# Patient Record
Sex: Female | Born: 1950 | Race: White | Hispanic: No | Smoking: Former smoker
Health system: Southern US, Community
[De-identification: ages and names within clinical notes are randomized; demographics above are authoritative.]

## PROBLEM LIST (undated history)

## (undated) DIAGNOSIS — D473 Essential (hemorrhagic) thrombocythemia: Secondary | ICD-10-CM

## (undated) DIAGNOSIS — M159 Polyosteoarthritis, unspecified: Secondary | ICD-10-CM

## (undated) DIAGNOSIS — D7581 Myelofibrosis: Secondary | ICD-10-CM

## (undated) DIAGNOSIS — B86 Scabies: Secondary | ICD-10-CM

## (undated) DIAGNOSIS — S41001S Unspecified open wound of right shoulder, sequela: Secondary | ICD-10-CM

## (undated) DIAGNOSIS — E079 Disorder of thyroid, unspecified: Secondary | ICD-10-CM

## (undated) DIAGNOSIS — G44219 Episodic tension-type headache, not intractable: Secondary | ICD-10-CM

## (undated) DIAGNOSIS — I10 Essential (primary) hypertension: Secondary | ICD-10-CM

## (undated) DIAGNOSIS — K219 Gastro-esophageal reflux disease without esophagitis: Secondary | ICD-10-CM

## (undated) DIAGNOSIS — N289 Disorder of kidney and ureter, unspecified: Secondary | ICD-10-CM

## (undated) DIAGNOSIS — M1990 Unspecified osteoarthritis, unspecified site: Secondary | ICD-10-CM

## (undated) DIAGNOSIS — R569 Unspecified convulsions: Secondary | ICD-10-CM

## (undated) DIAGNOSIS — R739 Hyperglycemia, unspecified: Secondary | ICD-10-CM

## (undated) DIAGNOSIS — K529 Noninfective gastroenteritis and colitis, unspecified: Secondary | ICD-10-CM

## (undated) DIAGNOSIS — A048 Other specified bacterial intestinal infections: Secondary | ICD-10-CM

## (undated) DIAGNOSIS — D500 Iron deficiency anemia secondary to blood loss (chronic): Secondary | ICD-10-CM

## (undated) DIAGNOSIS — D509 Iron deficiency anemia, unspecified: Secondary | ICD-10-CM

## (undated) DIAGNOSIS — M6282 Rhabdomyolysis: Secondary | ICD-10-CM

## (undated) DIAGNOSIS — K5792 Diverticulitis of intestine, part unspecified, without perforation or abscess without bleeding: Secondary | ICD-10-CM

## (undated) DIAGNOSIS — H269 Unspecified cataract: Secondary | ICD-10-CM

## (undated) DIAGNOSIS — G2581 Restless legs syndrome: Secondary | ICD-10-CM

## (undated) DIAGNOSIS — E785 Hyperlipidemia, unspecified: Secondary | ICD-10-CM

## (undated) DIAGNOSIS — F418 Other specified anxiety disorders: Secondary | ICD-10-CM

## (undated) HISTORY — DX: Noninfective gastroenteritis and colitis, unspecified: K52.9

## (undated) HISTORY — DX: Polyosteoarthritis, unspecified: M15.9

## (undated) HISTORY — DX: Myelofibrosis: D75.81

## (undated) HISTORY — PX: BREAST BIOPSY: SHX20

## (undated) HISTORY — DX: Scabies: B86

## (undated) HISTORY — PX: BREAST CYST ASPIRATION: SHX578

## (undated) HISTORY — DX: Essential (primary) hypertension: I10

## (undated) HISTORY — DX: Essential (hemorrhagic) thrombocythemia: D47.3

## (undated) HISTORY — DX: Unspecified cataract: H26.9

## (undated) HISTORY — DX: Iron deficiency anemia, unspecified: D50.9

## (undated) HISTORY — DX: Unspecified osteoarthritis, unspecified site: M19.90

## (undated) HISTORY — DX: Diverticulitis of intestine, part unspecified, without perforation or abscess without bleeding: K57.92

## (undated) HISTORY — DX: Hyperglycemia, unspecified: R73.9

## (undated) HISTORY — DX: Restless legs syndrome: G25.81

## (undated) HISTORY — DX: Disorder of thyroid, unspecified: E07.9

## (undated) HISTORY — DX: Other specified bacterial intestinal infections: A04.8

## (undated) HISTORY — PX: ABDOMINAL HYSTERECTOMY: SHX81

## (undated) HISTORY — DX: Unspecified open wound of right shoulder, sequela: S41.001S

## (undated) HISTORY — DX: Disorder of kidney and ureter, unspecified: N28.9

## (undated) HISTORY — DX: Iron deficiency anemia secondary to blood loss (chronic): D50.0

## (undated) HISTORY — DX: Episodic tension-type headache, not intractable: G44.219

## (undated) HISTORY — DX: Other specified anxiety disorders: F41.8

## (undated) HISTORY — DX: Unspecified convulsions: R56.9

## (undated) HISTORY — DX: Hyperlipidemia, unspecified: E78.5

## (undated) HISTORY — DX: Gastro-esophageal reflux disease without esophagitis: K21.9

---

## 1990-07-31 HISTORY — PX: BREAST SURGERY: SHX581

## 2006-11-13 ENCOUNTER — Ambulatory Visit: Payer: Self-pay | Admitting: Hematology & Oncology

## 2006-12-07 LAB — CBC WITH DIFFERENTIAL/PLATELET
BASO%: 1.1 % (ref 0.0–2.0)
Basophils Absolute: 0.1 10*3/uL (ref 0.0–0.1)
EOS%: 4.2 % (ref 0.0–7.0)
Eosinophils Absolute: 0.2 10*3/uL (ref 0.0–0.5)
HCT: 39.6 % (ref 34.8–46.6)
HGB: 13.7 g/dL (ref 11.6–15.9)
LYMPH%: 31.2 % (ref 14.0–48.0)
MCH: 32.3 pg (ref 26.0–34.0)
MCHC: 34.6 g/dL (ref 32.0–36.0)
MCV: 93.5 fL (ref 81.0–101.0)
MONO#: 0.4 10*3/uL (ref 0.1–0.9)
MONO%: 7.6 % (ref 0.0–13.0)
NEUT#: 2.8 10*3/uL (ref 1.5–6.5)
NEUT%: 55.9 % (ref 39.6–76.8)
Platelets: 671 10*3/uL — ABNORMAL HIGH (ref 145–400)
RBC: 4.24 10*6/uL (ref 3.70–5.32)
RDW: 12.4 % (ref 11.3–14.5)
WBC: 5 10*3/uL (ref 3.9–10.0)
lymph#: 1.6 10*3/uL (ref 0.9–3.3)

## 2006-12-07 LAB — CHCC SMEAR

## 2006-12-11 LAB — JAK2 GENOTYPR

## 2006-12-11 LAB — FERRITIN: Ferritin: 69 ng/mL (ref 10–291)

## 2007-01-02 ENCOUNTER — Ambulatory Visit: Payer: Self-pay | Admitting: Hematology & Oncology

## 2007-01-04 LAB — CHCC SMEAR

## 2007-01-04 LAB — CBC WITH DIFFERENTIAL/PLATELET
BASO%: 0.7 % (ref 0.0–2.0)
Basophils Absolute: 0 10*3/uL (ref 0.0–0.1)
EOS%: 3.9 % (ref 0.0–7.0)
Eosinophils Absolute: 0.2 10*3/uL (ref 0.0–0.5)
HCT: 37.7 % (ref 34.8–46.6)
HGB: 13.3 g/dL (ref 11.6–15.9)
LYMPH%: 33.8 % (ref 14.0–48.0)
MCH: 32.8 pg (ref 26.0–34.0)
MCHC: 35.1 g/dL (ref 32.0–36.0)
MCV: 93.3 fL (ref 81.0–101.0)
MONO#: 0.4 10*3/uL (ref 0.1–0.9)
MONO%: 7.3 % (ref 0.0–13.0)
NEUT#: 2.9 10*3/uL (ref 1.5–6.5)
NEUT%: 54.3 % (ref 39.6–76.8)
Platelets: 684 10*3/uL — ABNORMAL HIGH (ref 145–400)
RBC: 4.04 10*6/uL (ref 3.70–5.32)
RDW: 13 % (ref 11.3–14.5)
WBC: 5.3 10*3/uL (ref 3.9–10.0)
lymph#: 1.8 10*3/uL (ref 0.9–3.3)

## 2007-01-04 LAB — IVY BLEEDING TIME: Bleeding Time: 4 Minutes (ref 2.0–8.0)

## 2007-01-07 LAB — VON WILLEBRAND PANEL
Factor-VIII Activity: 80 % (ref 75–150)
Ristocetin-Cofactor: 59 % (ref 50–150)
Von Willebrand Ag: 147 % normal (ref 61–164)

## 2007-01-07 LAB — APTT: aPTT: 36 seconds (ref 24–37)

## 2007-03-13 ENCOUNTER — Ambulatory Visit: Payer: Self-pay | Admitting: Hematology & Oncology

## 2007-03-15 LAB — CBC WITH DIFFERENTIAL/PLATELET
BASO%: 0.5 % (ref 0.0–2.0)
Basophils Absolute: 0 10*3/uL (ref 0.0–0.1)
EOS%: 5.5 % (ref 0.0–7.0)
Eosinophils Absolute: 0.3 10*3/uL (ref 0.0–0.5)
HCT: 36.6 % (ref 34.8–46.6)
HGB: 13 g/dL (ref 11.6–15.9)
LYMPH%: 30.4 % (ref 14.0–48.0)
MCH: 33.4 pg (ref 26.0–34.0)
MCHC: 35.5 g/dL (ref 32.0–36.0)
MCV: 94.2 fL (ref 81.0–101.0)
MONO#: 0.4 10*3/uL (ref 0.1–0.9)
MONO%: 7 % (ref 0.0–13.0)
NEUT#: 3 10*3/uL (ref 1.5–6.5)
NEUT%: 56.6 % (ref 39.6–76.8)
Platelets: 646 10*3/uL — ABNORMAL HIGH (ref 145–400)
RBC: 3.88 10*6/uL (ref 3.70–5.32)
RDW: 13.3 % (ref 11.3–14.5)
WBC: 5.3 10*3/uL (ref 3.9–10.0)
lymph#: 1.6 10*3/uL (ref 0.9–3.3)

## 2007-03-15 LAB — CHCC SMEAR

## 2007-03-15 LAB — FERRITIN: Ferritin: 53 ng/mL (ref 10–291)

## 2007-06-05 ENCOUNTER — Ambulatory Visit: Payer: Self-pay | Admitting: Hematology & Oncology

## 2007-06-07 LAB — CBC WITH DIFFERENTIAL/PLATELET
BASO%: 0.6 % (ref 0.0–2.0)
Basophils Absolute: 0 10*3/uL (ref 0.0–0.1)
EOS%: 2.3 % (ref 0.0–7.0)
Eosinophils Absolute: 0.2 10*3/uL (ref 0.0–0.5)
HCT: 38.5 % (ref 34.8–46.6)
HGB: 13.4 g/dL (ref 11.6–15.9)
LYMPH%: 30.6 % (ref 14.0–48.0)
MCH: 32.7 pg (ref 26.0–34.0)
MCHC: 34.9 g/dL (ref 32.0–36.0)
MCV: 93.6 fL (ref 81.0–101.0)
MONO#: 0.4 10*3/uL (ref 0.1–0.9)
MONO%: 6.3 % (ref 0.0–13.0)
NEUT#: 4.3 10*3/uL (ref 1.5–6.5)
NEUT%: 60.2 % (ref 39.6–76.8)
Platelets: 1095 10*3/uL — ABNORMAL HIGH (ref 145–400)
RBC: 4.11 10*6/uL (ref 3.70–5.32)
RDW: 13 % (ref 11.3–14.5)
WBC: 7.1 10*3/uL (ref 3.9–10.0)
lymph#: 2.2 10*3/uL (ref 0.9–3.3)

## 2007-06-07 LAB — CHCC SMEAR

## 2007-06-12 ENCOUNTER — Ambulatory Visit (HOSPITAL_COMMUNITY): Admission: RE | Admit: 2007-06-12 | Discharge: 2007-06-12 | Payer: Self-pay | Admitting: Hematology & Oncology

## 2007-06-12 LAB — COMPREHENSIVE METABOLIC PANEL
ALT: 12 U/L (ref 0–35)
AST: 19 U/L (ref 0–37)
Albumin: 4 g/dL (ref 3.5–5.2)
Alkaline Phosphatase: 65 U/L (ref 39–117)
BUN: 18 mg/dL (ref 6–23)
CO2: 30 mEq/L (ref 19–32)
Calcium: 8.6 mg/dL (ref 8.4–10.5)
Chloride: 101 mEq/L (ref 96–112)
Creatinine, Ser: 0.71 mg/dL (ref 0.40–1.20)
Glucose, Bld: 98 mg/dL (ref 70–99)
Potassium: 3.6 mEq/L (ref 3.5–5.3)
Sodium: 134 mEq/L — ABNORMAL LOW (ref 135–145)
Total Bilirubin: 0.7 mg/dL (ref 0.3–1.2)
Total Protein: 7.2 g/dL (ref 6.0–8.3)

## 2007-06-20 LAB — CBC WITH DIFFERENTIAL/PLATELET
BASO%: 0.1 % (ref 0.0–2.0)
Basophils Absolute: 0 10*3/uL (ref 0.0–0.1)
EOS%: 2.2 % (ref 0.0–7.0)
Eosinophils Absolute: 0.1 10*3/uL (ref 0.0–0.5)
HCT: 35.1 % (ref 34.8–46.6)
HGB: 12.3 g/dL (ref 11.6–15.9)
LYMPH%: 25.2 % (ref 14.0–48.0)
MCH: 33.4 pg (ref 26.0–34.0)
MCHC: 35 g/dL (ref 32.0–36.0)
MCV: 95.5 fL (ref 81.0–101.0)
MONO#: 0.1 10*3/uL (ref 0.1–0.9)
MONO%: 2.3 % (ref 0.0–13.0)
NEUT#: 4.1 10*3/uL (ref 1.5–6.5)
NEUT%: 70.2 % (ref 39.6–76.8)
Platelets: 696 10*3/uL — ABNORMAL HIGH (ref 145–400)
RBC: 3.68 10*6/uL — ABNORMAL LOW (ref 3.70–5.32)
RDW: 12.9 % (ref 11.3–14.5)
WBC: 5.8 10*3/uL (ref 3.9–10.0)
lymph#: 1.5 10*3/uL (ref 0.9–3.3)

## 2007-06-20 LAB — CHCC SMEAR

## 2007-07-02 LAB — CBC WITH DIFFERENTIAL/PLATELET
BASO%: 1 % (ref 0.0–2.0)
Basophils Absolute: 0 10*3/uL (ref 0.0–0.1)
EOS%: 2 % (ref 0.0–7.0)
Eosinophils Absolute: 0.1 10*3/uL (ref 0.0–0.5)
HCT: 35.8 % (ref 34.8–46.6)
HGB: 12.6 g/dL (ref 11.6–15.9)
LYMPH%: 36.5 % (ref 14.0–48.0)
MCH: 34.2 pg — ABNORMAL HIGH (ref 26.0–34.0)
MCHC: 35.1 g/dL (ref 32.0–36.0)
MCV: 97.6 fL (ref 81.0–101.0)
MONO#: 0.3 10*3/uL (ref 0.1–0.9)
MONO%: 7.6 % (ref 0.0–13.0)
NEUT#: 2.4 10*3/uL (ref 1.5–6.5)
NEUT%: 52.9 % (ref 39.6–76.8)
Platelets: 548 10*3/uL — ABNORMAL HIGH (ref 145–400)
RBC: 3.67 10*6/uL — ABNORMAL LOW (ref 3.70–5.32)
RDW: 13.7 % (ref 11.3–14.5)
WBC: 4.6 10*3/uL (ref 3.9–10.0)
lymph#: 1.7 10*3/uL (ref 0.9–3.3)

## 2007-07-19 LAB — CHCC SMEAR

## 2007-07-19 LAB — CBC WITH DIFFERENTIAL/PLATELET
BASO%: 1.5 % (ref 0.0–2.0)
Basophils Absolute: 0.1 10*3/uL (ref 0.0–0.1)
EOS%: 2.5 % (ref 0.0–7.0)
Eosinophils Absolute: 0.1 10*3/uL (ref 0.0–0.5)
HCT: 36.1 % (ref 34.8–46.6)
HGB: 13 g/dL (ref 11.6–15.9)
LYMPH%: 25.2 % (ref 14.0–48.0)
MCH: 35.1 pg — ABNORMAL HIGH (ref 26.0–34.0)
MCHC: 36 g/dL (ref 32.0–36.0)
MCV: 97.6 fL (ref 81.0–101.0)
MONO#: 0.4 10*3/uL (ref 0.1–0.9)
MONO%: 7.3 % (ref 0.0–13.0)
NEUT#: 3.6 10*3/uL (ref 1.5–6.5)
NEUT%: 63.5 % (ref 39.6–76.8)
Platelets: 619 10*3/uL — ABNORMAL HIGH (ref 145–400)
RBC: 3.7 10*6/uL (ref 3.70–5.32)
RDW: 16.1 % — ABNORMAL HIGH (ref 11.3–14.5)
WBC: 5.7 10*3/uL (ref 3.9–10.0)
lymph#: 1.4 10*3/uL (ref 0.9–3.3)

## 2007-08-08 ENCOUNTER — Ambulatory Visit: Payer: Self-pay | Admitting: Hematology & Oncology

## 2007-08-12 LAB — CBC WITH DIFFERENTIAL/PLATELET
BASO%: 0.9 % (ref 0.0–2.0)
Basophils Absolute: 0 10*3/uL (ref 0.0–0.1)
EOS%: 3.9 % (ref 0.0–7.0)
Eosinophils Absolute: 0.2 10*3/uL (ref 0.0–0.5)
HCT: 37.7 % (ref 34.8–46.6)
HGB: 13.1 g/dL (ref 11.6–15.9)
LYMPH%: 31.8 % (ref 14.0–48.0)
MCH: 36.3 pg — ABNORMAL HIGH (ref 26.0–34.0)
MCHC: 34.7 g/dL (ref 32.0–36.0)
MCV: 104.7 fL — ABNORMAL HIGH (ref 81.0–101.0)
MONO#: 0.4 10*3/uL (ref 0.1–0.9)
MONO%: 8.6 % (ref 0.0–13.0)
NEUT#: 2.5 10*3/uL (ref 1.5–6.5)
NEUT%: 54.8 % (ref 39.6–76.8)
Platelets: 556 10*3/uL — ABNORMAL HIGH (ref 145–400)
RBC: 3.6 10*6/uL — ABNORMAL LOW (ref 3.70–5.32)
RDW: 21.5 % — ABNORMAL HIGH (ref 11.3–14.5)
WBC: 4.5 10*3/uL (ref 3.9–10.0)
lymph#: 1.4 10*3/uL (ref 0.9–3.3)

## 2007-08-30 LAB — CBC WITH DIFFERENTIAL/PLATELET
BASO%: 0.7 % (ref 0.0–2.0)
Basophils Absolute: 0 10*3/uL (ref 0.0–0.1)
EOS%: 3.2 % (ref 0.0–7.0)
Eosinophils Absolute: 0.1 10*3/uL (ref 0.0–0.5)
HCT: 38.7 % (ref 34.8–46.6)
HGB: 13.4 g/dL (ref 11.6–15.9)
LYMPH%: 35.8 % (ref 14.0–48.0)
MCH: 36.7 pg — ABNORMAL HIGH (ref 26.0–34.0)
MCHC: 34.5 g/dL (ref 32.0–36.0)
MCV: 106.4 fL — ABNORMAL HIGH (ref 81.0–101.0)
MONO#: 0.3 10*3/uL (ref 0.1–0.9)
MONO%: 6.9 % (ref 0.0–13.0)
NEUT#: 2.3 10*3/uL (ref 1.5–6.5)
NEUT%: 53.4 % (ref 39.6–76.8)
Platelets: 533 10*3/uL — ABNORMAL HIGH (ref 145–400)
RBC: 3.64 10*6/uL — ABNORMAL LOW (ref 3.70–5.32)
RDW: 19.2 % — ABNORMAL HIGH (ref 11.3–14.5)
WBC: 4.4 10*3/uL (ref 3.9–10.0)
lymph#: 1.6 10*3/uL (ref 0.9–3.3)

## 2007-08-30 LAB — CHCC SMEAR

## 2007-09-02 LAB — FERRITIN: Ferritin: 78 ng/mL (ref 10–291)

## 2007-09-02 LAB — TRANSFERRIN RECEPTOR, SOLUABLE: Transferrin Receptor, Soluble: 21.9 nmol/L

## 2007-10-09 ENCOUNTER — Ambulatory Visit: Payer: Self-pay | Admitting: Hematology & Oncology

## 2007-10-11 LAB — CBC WITH DIFFERENTIAL/PLATELET
BASO%: 0.8 % (ref 0.0–2.0)
Basophils Absolute: 0 10*3/uL (ref 0.0–0.1)
EOS%: 2.3 % (ref 0.0–7.0)
Eosinophils Absolute: 0.1 10*3/uL (ref 0.0–0.5)
HCT: 40.6 % (ref 34.8–46.6)
HGB: 13.9 g/dL (ref 11.6–15.9)
LYMPH%: 30.9 % (ref 14.0–48.0)
MCH: 37.7 pg — ABNORMAL HIGH (ref 26.0–34.0)
MCHC: 34.3 g/dL (ref 32.0–36.0)
MCV: 110.1 fL — ABNORMAL HIGH (ref 81.0–101.0)
MONO#: 0.5 10*3/uL (ref 0.1–0.9)
MONO%: 9 % (ref 0.0–13.0)
NEUT#: 3 10*3/uL (ref 1.5–6.5)
NEUT%: 57 % (ref 39.6–76.8)
Platelets: 502 10*3/uL — ABNORMAL HIGH (ref 145–400)
RBC: 3.69 10*6/uL — ABNORMAL LOW (ref 3.70–5.32)
RDW: 13 % (ref 11.3–14.5)
WBC: 5.3 10*3/uL (ref 3.9–10.0)
lymph#: 1.6 10*3/uL (ref 0.9–3.3)

## 2007-10-11 LAB — CHCC SMEAR

## 2007-12-18 ENCOUNTER — Ambulatory Visit: Payer: Self-pay | Admitting: Hematology & Oncology

## 2008-03-11 ENCOUNTER — Ambulatory Visit: Payer: Self-pay | Admitting: Hematology & Oncology

## 2008-03-13 LAB — CBC WITH DIFFERENTIAL (CANCER CENTER ONLY)
BASO#: 0 10*3/uL (ref 0.0–0.2)
BASO%: 0.4 % (ref 0.0–2.0)
EOS%: 3.7 % (ref 0.0–7.0)
Eosinophils Absolute: 0.2 10*3/uL (ref 0.0–0.5)
HCT: 36.6 % (ref 34.8–46.6)
HGB: 12.7 g/dL (ref 11.6–15.9)
LYMPH#: 1.2 10*3/uL (ref 0.9–3.3)
LYMPH%: 25.9 % (ref 14.0–48.0)
MCH: 36.7 pg — ABNORMAL HIGH (ref 26.0–34.0)
MCHC: 34.7 g/dL (ref 32.0–36.0)
MCV: 106 fL — ABNORMAL HIGH (ref 81–101)
MONO#: 0.3 10*3/uL (ref 0.1–0.9)
MONO%: 6.7 % (ref 0.0–13.0)
NEUT#: 3 10*3/uL (ref 1.5–6.5)
NEUT%: 63.3 % (ref 39.6–80.0)
Platelets: 487 10*3/uL — ABNORMAL HIGH (ref 145–400)
RBC: 3.46 10*6/uL — ABNORMAL LOW (ref 3.70–5.32)
RDW: 11.6 % (ref 10.5–14.6)
WBC: 4.7 10*3/uL (ref 3.9–10.0)

## 2008-03-13 LAB — FERRITIN: Ferritin: 80 ng/mL (ref 10–291)

## 2008-03-13 LAB — CHCC SATELLITE - SMEAR

## 2008-06-10 ENCOUNTER — Ambulatory Visit: Payer: Self-pay | Admitting: Hematology & Oncology

## 2008-06-11 LAB — CBC WITH DIFFERENTIAL (CANCER CENTER ONLY)
BASO#: 0 10*3/uL (ref 0.0–0.2)
BASO%: 0.6 % (ref 0.0–2.0)
EOS%: 5.9 % (ref 0.0–7.0)
Eosinophils Absolute: 0.2 10*3/uL (ref 0.0–0.5)
HCT: 37.6 % (ref 34.8–46.6)
HGB: 13.1 g/dL (ref 11.6–15.9)
LYMPH#: 1.3 10*3/uL (ref 0.9–3.3)
LYMPH%: 32.6 % (ref 14.0–48.0)
MCH: 36.6 pg — ABNORMAL HIGH (ref 26.0–34.0)
MCHC: 34.8 g/dL (ref 32.0–36.0)
MCV: 105 fL — ABNORMAL HIGH (ref 81–101)
MONO#: 0.2 10*3/uL (ref 0.1–0.9)
MONO%: 5.7 % (ref 0.0–13.0)
NEUT#: 2.2 10*3/uL (ref 1.5–6.5)
NEUT%: 55.2 % (ref 39.6–80.0)
Platelets: 540 10*3/uL — ABNORMAL HIGH (ref 145–400)
RBC: 3.58 10*6/uL — ABNORMAL LOW (ref 3.70–5.32)
RDW: 10.9 % (ref 10.5–14.6)
WBC: 4 10*3/uL (ref 3.9–10.0)

## 2008-06-11 LAB — CHCC SATELLITE - SMEAR

## 2008-09-10 ENCOUNTER — Ambulatory Visit: Payer: Self-pay | Admitting: Hematology & Oncology

## 2008-11-19 ENCOUNTER — Ambulatory Visit: Payer: Self-pay | Admitting: Hematology & Oncology

## 2008-11-20 LAB — CBC WITH DIFFERENTIAL (CANCER CENTER ONLY)
BASO#: 0 10*3/uL (ref 0.0–0.2)
BASO%: 0.4 % (ref 0.0–2.0)
EOS%: 5.2 % (ref 0.0–7.0)
Eosinophils Absolute: 0.2 10*3/uL (ref 0.0–0.5)
HCT: 37.6 % (ref 34.8–46.6)
HGB: 12.6 g/dL (ref 11.6–15.9)
LYMPH#: 1.2 10*3/uL (ref 0.9–3.3)
LYMPH%: 34.9 % (ref 14.0–48.0)
MCH: 37.1 pg — ABNORMAL HIGH (ref 26.0–34.0)
MCHC: 33.6 g/dL (ref 32.0–36.0)
MCV: 111 fL — ABNORMAL HIGH (ref 81–101)
MONO#: 0.2 10*3/uL (ref 0.1–0.9)
MONO%: 6.5 % (ref 0.0–13.0)
NEUT#: 1.8 10*3/uL (ref 1.5–6.5)
NEUT%: 53 % (ref 39.6–80.0)
Platelets: 513 10*3/uL — ABNORMAL HIGH (ref 145–400)
RBC: 3.4 10*6/uL — ABNORMAL LOW (ref 3.70–5.32)
RDW: 12.7 % (ref 10.5–14.6)
WBC: 3.5 10*3/uL — ABNORMAL LOW (ref 3.9–10.0)

## 2008-11-20 LAB — RETICULOCYTES (CHCC)
ABS Retic: 64 10*3/uL (ref 19.0–186.0)
RBC.: 3.37 MIL/uL — ABNORMAL LOW (ref 3.87–5.11)
Retic Ct Pct: 1.9 % (ref 0.4–3.1)

## 2008-11-20 LAB — CHCC SATELLITE - SMEAR

## 2008-11-20 LAB — FERRITIN: Ferritin: 92 ng/mL (ref 10–291)

## 2009-01-08 ENCOUNTER — Ambulatory Visit: Payer: Self-pay | Admitting: Hematology & Oncology

## 2009-01-11 LAB — CBC WITH DIFFERENTIAL (CANCER CENTER ONLY)
BASO#: 0 10*3/uL (ref 0.0–0.2)
BASO%: 0.4 % (ref 0.0–2.0)
EOS%: 3.8 % (ref 0.0–7.0)
Eosinophils Absolute: 0.1 10*3/uL (ref 0.0–0.5)
HCT: 37.2 % (ref 34.8–46.6)
HGB: 12.6 g/dL (ref 11.6–15.9)
LYMPH#: 1.1 10*3/uL (ref 0.9–3.3)
LYMPH%: 40.6 % (ref 14.0–48.0)
MCH: 38.8 pg — ABNORMAL HIGH (ref 26.0–34.0)
MCHC: 34 g/dL (ref 32.0–36.0)
MCV: 114 fL — ABNORMAL HIGH (ref 81–101)
MONO#: 0.2 10*3/uL (ref 0.1–0.9)
MONO%: 6.4 % (ref 0.0–13.0)
NEUT#: 1.4 10*3/uL — ABNORMAL LOW (ref 1.5–6.5)
NEUT%: 48.8 % (ref 39.6–80.0)
Platelets: 358 10*3/uL (ref 145–400)
RBC: 3.25 10*6/uL — ABNORMAL LOW (ref 3.70–5.32)
RDW: 10.6 % (ref 10.5–14.6)
WBC: 2.8 10*3/uL — ABNORMAL LOW (ref 3.9–10.0)

## 2009-01-11 LAB — CHCC SATELLITE - SMEAR

## 2009-01-11 LAB — RETICULOCYTES (CHCC)
ABS Retic: 45.6 10*3/uL (ref 19.0–186.0)
RBC.: 3.26 MIL/uL — ABNORMAL LOW (ref 3.87–5.11)
Retic Ct Pct: 1.4 % (ref 0.4–3.1)

## 2009-01-11 LAB — FERRITIN: Ferritin: 96 ng/mL (ref 10–291)

## 2009-03-16 ENCOUNTER — Ambulatory Visit: Payer: Self-pay | Admitting: Hematology & Oncology

## 2009-03-17 LAB — CBC WITH DIFFERENTIAL (CANCER CENTER ONLY)
BASO#: 0 10*3/uL (ref 0.0–0.2)
BASO%: 0.6 % (ref 0.0–2.0)
EOS%: 4.8 % (ref 0.0–7.0)
Eosinophils Absolute: 0.2 10*3/uL (ref 0.0–0.5)
HCT: 38.6 % (ref 34.8–46.6)
HGB: 13.3 g/dL (ref 11.6–15.9)
LYMPH#: 1.4 10*3/uL (ref 0.9–3.3)
LYMPH%: 36.9 % (ref 14.0–48.0)
MCH: 38.3 pg — ABNORMAL HIGH (ref 26.0–34.0)
MCHC: 34.4 g/dL (ref 32.0–36.0)
MCV: 111 fL — ABNORMAL HIGH (ref 81–101)
MONO#: 0.3 10*3/uL (ref 0.1–0.9)
MONO%: 7.2 % (ref 0.0–13.0)
NEUT#: 2 10*3/uL (ref 1.5–6.5)
NEUT%: 50.5 % (ref 39.6–80.0)
Platelets: 524 10*3/uL — ABNORMAL HIGH (ref 145–400)
RBC: 3.47 10*6/uL — ABNORMAL LOW (ref 3.70–5.32)
RDW: 9.4 % — ABNORMAL LOW (ref 10.5–14.6)
WBC: 3.9 10*3/uL (ref 3.9–10.0)

## 2009-03-17 LAB — CHCC SATELLITE - SMEAR

## 2009-03-18 LAB — FERRITIN: Ferritin: 106 ng/mL (ref 10–291)

## 2009-03-18 LAB — VITAMIN D 25 HYDROXY (VIT D DEFICIENCY, FRACTURES): Vit D, 25-Hydroxy: 17 ng/mL — ABNORMAL LOW (ref 30–89)

## 2009-05-06 ENCOUNTER — Ambulatory Visit: Payer: Self-pay | Admitting: Hematology & Oncology

## 2009-05-07 LAB — CBC WITH DIFFERENTIAL (CANCER CENTER ONLY)
BASO#: 0 10*3/uL (ref 0.0–0.2)
BASO%: 0.5 % (ref 0.0–2.0)
EOS%: 3.5 % (ref 0.0–7.0)
Eosinophils Absolute: 0.2 10*3/uL (ref 0.0–0.5)
HCT: 38.2 % (ref 34.8–46.6)
HGB: 13.1 g/dL (ref 11.6–15.9)
LYMPH#: 1.4 10*3/uL (ref 0.9–3.3)
LYMPH%: 29.1 % (ref 14.0–48.0)
MCH: 37.3 pg — ABNORMAL HIGH (ref 26.0–34.0)
MCHC: 34.3 g/dL (ref 32.0–36.0)
MCV: 109 fL — ABNORMAL HIGH (ref 81–101)
MONO#: 0.3 10*3/uL (ref 0.1–0.9)
MONO%: 5.2 % (ref 0.0–13.0)
NEUT#: 3 10*3/uL (ref 1.5–6.5)
NEUT%: 61.7 % (ref 39.6–80.0)
Platelets: 522 10*3/uL — ABNORMAL HIGH (ref 145–400)
RBC: 3.51 10*6/uL — ABNORMAL LOW (ref 3.70–5.32)
RDW: 10 % — ABNORMAL LOW (ref 10.5–14.6)
WBC: 4.9 10*3/uL (ref 3.9–10.0)

## 2009-05-07 LAB — FERRITIN: Ferritin: 105 ng/mL (ref 10–291)

## 2009-05-07 LAB — CHCC SATELLITE - SMEAR

## 2009-07-15 ENCOUNTER — Ambulatory Visit: Payer: Self-pay | Admitting: Hematology & Oncology

## 2009-07-16 LAB — CBC WITH DIFFERENTIAL (CANCER CENTER ONLY)
BASO#: 0 10*3/uL (ref 0.0–0.2)
BASO%: 0.4 % (ref 0.0–2.0)
EOS%: 3 % (ref 0.0–7.0)
Eosinophils Absolute: 0.2 10*3/uL (ref 0.0–0.5)
HCT: 38.7 % (ref 34.8–46.6)
HGB: 13.6 g/dL (ref 11.6–15.9)
LYMPH#: 1.4 10*3/uL (ref 0.9–3.3)
LYMPH%: 21.6 % (ref 14.0–48.0)
MCH: 37.6 pg — ABNORMAL HIGH (ref 26.0–34.0)
MCHC: 35.1 g/dL (ref 32.0–36.0)
MCV: 107 fL — ABNORMAL HIGH (ref 81–101)
MONO#: 0.4 10*3/uL (ref 0.1–0.9)
MONO%: 5.5 % (ref 0.0–13.0)
NEUT#: 4.6 10*3/uL (ref 1.5–6.5)
NEUT%: 69.5 % (ref 39.6–80.0)
Platelets: 540 10*3/uL — ABNORMAL HIGH (ref 145–400)
RBC: 3.61 10*6/uL — ABNORMAL LOW (ref 3.70–5.32)
RDW: 11 % (ref 10.5–14.6)
WBC: 6.6 10*3/uL (ref 3.9–10.0)

## 2009-07-16 LAB — COMPREHENSIVE METABOLIC PANEL
ALT: 9 U/L (ref 0–35)
AST: 13 U/L (ref 0–37)
Albumin: 4.4 g/dL (ref 3.5–5.2)
Alkaline Phosphatase: 52 U/L (ref 39–117)
BUN: 18 mg/dL (ref 6–23)
CO2: 24 mEq/L (ref 19–32)
Calcium: 9.7 mg/dL (ref 8.4–10.5)
Chloride: 106 mEq/L (ref 96–112)
Creatinine, Ser: 0.69 mg/dL (ref 0.40–1.20)
Glucose, Bld: 70 mg/dL (ref 70–99)
Potassium: 4.3 mEq/L (ref 3.5–5.3)
Sodium: 139 mEq/L (ref 135–145)
Total Bilirubin: 0.4 mg/dL (ref 0.3–1.2)
Total Protein: 6.9 g/dL (ref 6.0–8.3)

## 2009-07-16 LAB — CHCC SATELLITE - SMEAR

## 2009-07-16 LAB — FERRITIN: Ferritin: 126 ng/mL (ref 10–291)

## 2009-08-26 ENCOUNTER — Ambulatory Visit: Payer: Self-pay | Admitting: Hematology & Oncology

## 2009-08-27 LAB — CBC WITH DIFFERENTIAL (CANCER CENTER ONLY)
BASO#: 0 10*3/uL (ref 0.0–0.2)
BASO%: 0.6 % (ref 0.0–2.0)
EOS%: 5.2 % (ref 0.0–7.0)
Eosinophils Absolute: 0.3 10*3/uL (ref 0.0–0.5)
HCT: 39.5 % (ref 34.8–46.6)
HGB: 13.4 g/dL (ref 11.6–15.9)
LYMPH#: 1.8 10*3/uL (ref 0.9–3.3)
LYMPH%: 32.8 % (ref 14.0–48.0)
MCH: 35.5 pg — ABNORMAL HIGH (ref 26.0–34.0)
MCHC: 33.8 g/dL (ref 32.0–36.0)
MCV: 105 fL — ABNORMAL HIGH (ref 81–101)
MONO#: 0.3 10*3/uL (ref 0.1–0.9)
MONO%: 4.7 % (ref 0.0–13.0)
NEUT#: 3.1 10*3/uL (ref 1.5–6.5)
NEUT%: 56.7 % (ref 39.6–80.0)
Platelets: 730 10*3/uL — ABNORMAL HIGH (ref 145–400)
RBC: 3.76 10*6/uL (ref 3.70–5.32)
RDW: 9.5 % — ABNORMAL LOW (ref 10.5–14.6)
WBC: 5.4 10*3/uL (ref 3.9–10.0)

## 2009-08-27 LAB — SEDIMENTATION RATE: Sed Rate: 12 mm/hr (ref 0–22)

## 2009-08-27 LAB — VITAMIN D 25 HYDROXY (VIT D DEFICIENCY, FRACTURES): Vit D, 25-Hydroxy: 44 ng/mL (ref 30–89)

## 2009-08-27 LAB — CK: Total CK: 51 U/L (ref 7–177)

## 2009-08-27 LAB — CHCC SATELLITE - SMEAR

## 2009-10-15 ENCOUNTER — Ambulatory Visit: Payer: Self-pay | Admitting: Hematology & Oncology

## 2009-10-18 LAB — CBC WITH DIFFERENTIAL (CANCER CENTER ONLY)
BASO#: 0 10*3/uL (ref 0.0–0.2)
BASO%: 0.5 % (ref 0.0–2.0)
EOS%: 4.5 % (ref 0.0–7.0)
Eosinophils Absolute: 0.1 10*3/uL (ref 0.0–0.5)
HCT: 38.6 % (ref 34.8–46.6)
HGB: 12.9 g/dL (ref 11.6–15.9)
LYMPH#: 1.2 10*3/uL (ref 0.9–3.3)
LYMPH%: 40.2 % (ref 14.0–48.0)
MCH: 35.6 pg — ABNORMAL HIGH (ref 26.0–34.0)
MCHC: 33.3 g/dL (ref 32.0–36.0)
MCV: 107 fL — ABNORMAL HIGH (ref 81–101)
MONO#: 0.2 10*3/uL (ref 0.1–0.9)
MONO%: 6.3 % (ref 0.0–13.0)
NEUT#: 1.4 10*3/uL — ABNORMAL LOW (ref 1.5–6.5)
NEUT%: 48.5 % (ref 39.6–80.0)
Platelets: 429 10*3/uL — ABNORMAL HIGH (ref 145–400)
RBC: 3.62 10*6/uL — ABNORMAL LOW (ref 3.70–5.32)
RDW: 11.8 % (ref 10.5–14.6)
WBC: 2.9 10*3/uL — ABNORMAL LOW (ref 3.9–10.0)

## 2009-10-18 LAB — FERRITIN: Ferritin: 130 ng/mL (ref 10–291)

## 2009-10-18 LAB — CHCC SATELLITE - SMEAR

## 2010-01-04 ENCOUNTER — Ambulatory Visit: Payer: Self-pay | Admitting: Hematology & Oncology

## 2010-01-06 LAB — CBC WITH DIFFERENTIAL (CANCER CENTER ONLY)
BASO#: 0 10*3/uL (ref 0.0–0.2)
BASO%: 0.4 % (ref 0.0–2.0)
EOS%: 4.3 % (ref 0.0–7.0)
Eosinophils Absolute: 0.2 10*3/uL (ref 0.0–0.5)
HCT: 37.1 % (ref 34.8–46.6)
HGB: 12.8 g/dL (ref 11.6–15.9)
LYMPH#: 1.2 10*3/uL (ref 0.9–3.3)
LYMPH%: 29.2 % (ref 14.0–48.0)
MCH: 37.3 pg — ABNORMAL HIGH (ref 26.0–34.0)
MCHC: 34.5 g/dL (ref 32.0–36.0)
MCV: 108 fL — ABNORMAL HIGH (ref 81–101)
MONO#: 0.2 10*3/uL (ref 0.1–0.9)
MONO%: 5.2 % (ref 0.0–13.0)
NEUT#: 2.6 10*3/uL (ref 1.5–6.5)
NEUT%: 60.9 % (ref 39.6–80.0)
Platelets: 549 10*3/uL — ABNORMAL HIGH (ref 145–400)
RBC: 3.44 10*6/uL — ABNORMAL LOW (ref 3.70–5.32)
RDW: 9.7 % — ABNORMAL LOW (ref 10.5–14.6)
WBC: 4.2 10*3/uL (ref 3.9–10.0)

## 2010-01-06 LAB — COMPREHENSIVE METABOLIC PANEL
ALT: 12 U/L (ref 0–35)
AST: 16 U/L (ref 0–37)
Albumin: 4.1 g/dL (ref 3.5–5.2)
Alkaline Phosphatase: 49 U/L (ref 39–117)
BUN: 18 mg/dL (ref 6–23)
CO2: 23 mEq/L (ref 19–32)
Calcium: 8.7 mg/dL (ref 8.4–10.5)
Chloride: 103 mEq/L (ref 96–112)
Creatinine, Ser: 0.75 mg/dL (ref 0.40–1.20)
Glucose, Bld: 93 mg/dL (ref 70–99)
Potassium: 3.9 mEq/L (ref 3.5–5.3)
Sodium: 137 mEq/L (ref 135–145)
Total Bilirubin: 0.4 mg/dL (ref 0.3–1.2)
Total Protein: 6.6 g/dL (ref 6.0–8.3)

## 2010-01-06 LAB — CHCC SATELLITE - SMEAR

## 2010-01-06 LAB — FERRITIN: Ferritin: 131 ng/mL (ref 10–291)

## 2010-04-12 ENCOUNTER — Ambulatory Visit: Payer: Self-pay | Admitting: Hematology & Oncology

## 2010-04-14 ENCOUNTER — Ambulatory Visit (HOSPITAL_BASED_OUTPATIENT_CLINIC_OR_DEPARTMENT_OTHER): Admission: RE | Admit: 2010-04-14 | Discharge: 2010-04-14 | Payer: Self-pay | Admitting: Hematology & Oncology

## 2010-04-14 ENCOUNTER — Ambulatory Visit: Payer: Self-pay | Admitting: Diagnostic Radiology

## 2010-04-14 LAB — CBC WITH DIFFERENTIAL (CANCER CENTER ONLY)
BASO#: 0 10*3/uL (ref 0.0–0.2)
BASO%: 0.5 % (ref 0.0–2.0)
EOS%: 4.7 % (ref 0.0–7.0)
Eosinophils Absolute: 0.2 10*3/uL (ref 0.0–0.5)
HCT: 38.9 % (ref 34.8–46.6)
HGB: 12.9 g/dL (ref 11.6–15.9)
LYMPH#: 1.3 10*3/uL (ref 0.9–3.3)
LYMPH%: 33.5 % (ref 14.0–48.0)
MCH: 35.4 pg — ABNORMAL HIGH (ref 26.0–34.0)
MCHC: 33.1 g/dL (ref 32.0–36.0)
MCV: 107 fL — ABNORMAL HIGH (ref 81–101)
MONO#: 0.2 10*3/uL (ref 0.1–0.9)
MONO%: 5.8 % (ref 0.0–13.0)
NEUT#: 2.2 10*3/uL (ref 1.5–6.5)
NEUT%: 55.5 % (ref 39.6–80.0)
Platelets: 688 10*3/uL — ABNORMAL HIGH (ref 145–400)
RBC: 3.63 10*6/uL — ABNORMAL LOW (ref 3.70–5.32)
RDW: 10.8 % (ref 10.5–14.6)
WBC: 3.9 10*3/uL (ref 3.9–10.0)

## 2010-04-14 LAB — FERRITIN: Ferritin: 99 ng/mL (ref 10–291)

## 2010-04-14 LAB — CHCC SATELLITE - SMEAR

## 2010-04-27 ENCOUNTER — Ambulatory Visit: Payer: Self-pay | Admitting: Cardiology

## 2010-04-27 ENCOUNTER — Encounter: Payer: Self-pay | Admitting: Hematology & Oncology

## 2010-04-27 ENCOUNTER — Ambulatory Visit: Admission: RE | Admit: 2010-04-27 | Discharge: 2010-04-27 | Payer: Self-pay | Admitting: Hematology & Oncology

## 2010-06-07 ENCOUNTER — Ambulatory Visit: Payer: Self-pay | Admitting: Hematology & Oncology

## 2010-06-09 LAB — CBC WITH DIFFERENTIAL (CANCER CENTER ONLY)
BASO#: 0 10*3/uL (ref 0.0–0.2)
BASO%: 0.6 % (ref 0.0–2.0)
EOS%: 4.3 % (ref 0.0–7.0)
Eosinophils Absolute: 0.2 10*3/uL (ref 0.0–0.5)
HCT: 37.8 % (ref 34.8–46.6)
HGB: 12.7 g/dL (ref 11.6–15.9)
LYMPH#: 1.2 10*3/uL (ref 0.9–3.3)
LYMPH%: 35.1 % (ref 14.0–48.0)
MCH: 38.1 pg — ABNORMAL HIGH (ref 26.0–34.0)
MCHC: 33.6 g/dL (ref 32.0–36.0)
MCV: 113 fL — ABNORMAL HIGH (ref 81–101)
MONO#: 0.3 10*3/uL (ref 0.1–0.9)
MONO%: 8 % (ref 0.0–13.0)
NEUT#: 1.8 10*3/uL (ref 1.5–6.5)
NEUT%: 52 % (ref 39.6–80.0)
Platelets: 414 10*3/uL — ABNORMAL HIGH (ref 145–400)
RBC: 3.33 10*6/uL — ABNORMAL LOW (ref 3.70–5.32)
RDW: 12.9 % (ref 10.5–14.6)
WBC: 3.4 10*3/uL — ABNORMAL LOW (ref 3.9–10.0)

## 2010-06-09 LAB — IRON AND TIBC
%SAT: 39 % (ref 20–55)
Iron: 110 ug/dL (ref 42–145)
TIBC: 285 ug/dL (ref 250–470)
UIBC: 175 ug/dL

## 2010-06-09 LAB — CHCC SATELLITE - SMEAR

## 2010-06-09 LAB — FERRITIN: Ferritin: 104 ng/mL (ref 10–291)

## 2010-08-02 ENCOUNTER — Ambulatory Visit: Payer: Self-pay | Admitting: Hematology & Oncology

## 2010-08-04 ENCOUNTER — Ambulatory Visit (HOSPITAL_BASED_OUTPATIENT_CLINIC_OR_DEPARTMENT_OTHER): Payer: BC Managed Care – PPO | Admitting: Oncology

## 2010-08-04 LAB — CBC WITH DIFFERENTIAL (CANCER CENTER ONLY)
BASO#: 0 10*3/uL (ref 0.0–0.2)
BASO%: 0.5 % (ref 0.0–2.0)
EOS%: 4.9 % (ref 0.0–7.0)
Eosinophils Absolute: 0.2 10*3/uL (ref 0.0–0.5)
HCT: 34.4 % — ABNORMAL LOW (ref 34.8–46.6)
HGB: 11.6 g/dL (ref 11.6–15.9)
LYMPH#: 1.3 10*3/uL (ref 0.9–3.3)
LYMPH%: 32.7 % (ref 14.0–48.0)
MCH: 39.2 pg — ABNORMAL HIGH (ref 26.0–34.0)
MCHC: 33.8 g/dL (ref 32.0–36.0)
MCV: 116 fL — ABNORMAL HIGH (ref 81–101)
MONO#: 0.3 10*3/uL (ref 0.1–0.9)
MONO%: 7.9 % (ref 0.0–13.0)
NEUT#: 2.1 10*3/uL (ref 1.5–6.5)
NEUT%: 54 % (ref 39.6–80.0)
Platelets: 664 10*3/uL — ABNORMAL HIGH (ref 145–400)
RBC: 2.97 10*6/uL — ABNORMAL LOW (ref 3.70–5.32)
RDW: 10.8 % (ref 10.5–14.6)
WBC: 3.9 10*3/uL (ref 3.9–10.0)

## 2010-08-04 LAB — RETICULOCYTES (CHCC)
ABS Retic: 67.4 10*3/uL (ref 19.0–186.0)
RBC.: 2.93 MIL/uL — ABNORMAL LOW (ref 3.87–5.11)
Retic Ct Pct: 2.3 % (ref 0.4–3.1)

## 2010-08-04 LAB — FERRITIN: Ferritin: 124 ng/mL (ref 10–291)

## 2010-08-26 LAB — CMP (CANCER CENTER ONLY)
ALT(SGPT): 13 U/L (ref 10–47)
AST: 22 U/L (ref 11–38)
Albumin: 3.6 g/dL (ref 3.3–5.5)
Alkaline Phosphatase: 50 U/L (ref 26–84)
BUN, Bld: 16 mg/dL (ref 7–22)
CO2: 27 mEq/L (ref 18–33)
Calcium: 8.5 mg/dL (ref 8.0–10.3)
Chloride: 104 mEq/L (ref 98–108)
Creat: 0.5 mg/dl — ABNORMAL LOW (ref 0.6–1.2)
Glucose, Bld: 105 mg/dL (ref 73–118)
Potassium: 3.6 mEq/L (ref 3.3–4.7)
Sodium: 139 mEq/L (ref 128–145)
Total Bilirubin: 0.7 mg/dl (ref 0.20–1.60)
Total Protein: 6.9 g/dL (ref 6.4–8.1)

## 2010-08-26 LAB — CBC WITH DIFFERENTIAL (CANCER CENTER ONLY)
BASO#: 0 10*3/uL (ref 0.0–0.2)
BASO%: 0.8 % (ref 0.0–2.0)
EOS%: 4.5 % (ref 0.0–7.0)
Eosinophils Absolute: 0.1 10*3/uL (ref 0.0–0.5)
HCT: 37 % (ref 34.8–46.6)
HGB: 12.6 g/dL (ref 11.6–15.9)
LYMPH#: 1.1 10*3/uL (ref 0.9–3.3)
LYMPH%: 34.5 % (ref 14.0–48.0)
MCH: 40.8 pg — ABNORMAL HIGH (ref 26.0–34.0)
MCHC: 34 g/dL (ref 32.0–36.0)
MCV: 120 fL — ABNORMAL HIGH (ref 81–101)
MONO#: 0.3 10*3/uL (ref 0.1–0.9)
MONO%: 8 % (ref 0.0–13.0)
NEUT#: 1.7 10*3/uL (ref 1.5–6.5)
NEUT%: 52.2 % (ref 39.6–80.0)
Platelets: 402 10*3/uL — ABNORMAL HIGH (ref 145–400)
RBC: 3.09 10*6/uL — ABNORMAL LOW (ref 3.70–5.32)
RDW: 10.4 % — ABNORMAL LOW (ref 10.5–14.6)
WBC: 3.2 10*3/uL — ABNORMAL LOW (ref 3.9–10.0)

## 2010-08-26 LAB — FERRITIN: Ferritin: 115 ng/mL (ref 10–291)

## 2010-08-26 LAB — CHCC SATELLITE - SMEAR

## 2010-08-26 LAB — LACTATE DEHYDROGENASE: LDH: 167 U/L (ref 94–250)

## 2010-10-03 ENCOUNTER — Encounter (HOSPITAL_BASED_OUTPATIENT_CLINIC_OR_DEPARTMENT_OTHER): Payer: BC Managed Care – PPO | Admitting: Hematology & Oncology

## 2010-10-03 DIAGNOSIS — D473 Essential (hemorrhagic) thrombocythemia: Secondary | ICD-10-CM

## 2010-10-03 DIAGNOSIS — R197 Diarrhea, unspecified: Secondary | ICD-10-CM

## 2010-10-03 LAB — CBC WITH DIFFERENTIAL (CANCER CENTER ONLY)
BASO#: 0.1 10*3/uL (ref 0.0–0.2)
BASO%: 1.5 % (ref 0.0–2.0)
EOS%: 3.6 % (ref 0.0–7.0)
Eosinophils Absolute: 0.1 10*3/uL (ref 0.0–0.5)
HCT: 37.1 % (ref 34.8–46.6)
HGB: 12.7 g/dL (ref 11.6–15.9)
LYMPH#: 1.3 10*3/uL (ref 0.9–3.3)
LYMPH%: 38.9 % (ref 14.0–48.0)
MCH: 40.2 pg — ABNORMAL HIGH (ref 26.0–34.0)
MCHC: 34.2 g/dL (ref 32.0–36.0)
MCV: 117 fL — ABNORMAL HIGH (ref 81–101)
MONO#: 0.3 10*3/uL (ref 0.1–0.9)
MONO%: 9 % (ref 0.0–13.0)
NEUT#: 1.6 10*3/uL (ref 1.5–6.5)
NEUT%: 47 % (ref 39.6–80.0)
Platelets: 432 10*3/uL — ABNORMAL HIGH (ref 145–400)
RBC: 3.16 10*6/uL — ABNORMAL LOW (ref 3.70–5.32)
RDW: 12.3 % (ref 11.1–15.7)
WBC: 3.3 10*3/uL — ABNORMAL LOW (ref 3.9–10.0)

## 2010-10-03 LAB — FERRITIN: Ferritin: 134 ng/mL (ref 10–291)

## 2010-10-03 LAB — CHCC SATELLITE - SMEAR

## 2010-10-30 LAB — HM PAP SMEAR: HM Pap smear: NORMAL

## 2010-11-29 LAB — HM COLONOSCOPY

## 2010-11-29 LAB — HM MAMMOGRAPHY: HM Mammogram: NORMAL

## 2010-12-07 ENCOUNTER — Other Ambulatory Visit: Payer: Self-pay | Admitting: Hematology & Oncology

## 2010-12-07 ENCOUNTER — Encounter (HOSPITAL_BASED_OUTPATIENT_CLINIC_OR_DEPARTMENT_OTHER): Payer: BC Managed Care – PPO | Admitting: Hematology & Oncology

## 2010-12-07 DIAGNOSIS — R5383 Other fatigue: Secondary | ICD-10-CM

## 2010-12-07 DIAGNOSIS — D473 Essential (hemorrhagic) thrombocythemia: Secondary | ICD-10-CM

## 2010-12-07 DIAGNOSIS — R5381 Other malaise: Secondary | ICD-10-CM

## 2010-12-07 DIAGNOSIS — R197 Diarrhea, unspecified: Secondary | ICD-10-CM

## 2010-12-07 LAB — CBC WITH DIFFERENTIAL (CANCER CENTER ONLY)
BASO#: 0 10*3/uL (ref 0.0–0.2)
BASO%: 0.9 % (ref 0.0–2.0)
EOS%: 4.4 % (ref 0.0–7.0)
Eosinophils Absolute: 0.1 10*3/uL (ref 0.0–0.5)
HCT: 36.1 % (ref 34.8–46.6)
HGB: 12.5 g/dL (ref 11.6–15.9)
LYMPH#: 1.1 10*3/uL (ref 0.9–3.3)
LYMPH%: 35.3 % (ref 14.0–48.0)
MCH: 40.2 pg — ABNORMAL HIGH (ref 26.0–34.0)
MCHC: 34.6 g/dL (ref 32.0–36.0)
MCV: 116 fL — ABNORMAL HIGH (ref 81–101)
MONO#: 0.3 10*3/uL (ref 0.1–0.9)
MONO%: 8.4 % (ref 0.0–13.0)
NEUT#: 1.6 10*3/uL (ref 1.5–6.5)
NEUT%: 51 % (ref 39.6–80.0)
Platelets: 317 10*3/uL (ref 145–400)
RBC: 3.11 10*6/uL — ABNORMAL LOW (ref 3.70–5.32)
RDW: 13.3 % (ref 11.1–15.7)
WBC: 3.2 10*3/uL — ABNORMAL LOW (ref 3.9–10.0)

## 2010-12-07 LAB — RETICULOCYTES (CHCC)
ABS Retic: 41.5 10*3/uL (ref 19.0–186.0)
RBC.: 3.19 MIL/uL — ABNORMAL LOW (ref 3.87–5.11)
Retic Ct Pct: 1.3 % (ref 0.4–3.1)

## 2010-12-07 LAB — CHCC SATELLITE - SMEAR

## 2010-12-07 LAB — FERRITIN: Ferritin: 95 ng/mL (ref 10–291)

## 2011-01-20 ENCOUNTER — Encounter: Payer: Self-pay | Admitting: Family

## 2011-01-20 ENCOUNTER — Ambulatory Visit: Payer: BC Managed Care – PPO | Admitting: Family

## 2011-01-20 ENCOUNTER — Ambulatory Visit (INDEPENDENT_AMBULATORY_CARE_PROVIDER_SITE_OTHER): Payer: BC Managed Care – PPO | Admitting: Family

## 2011-01-20 ENCOUNTER — Encounter (HOSPITAL_BASED_OUTPATIENT_CLINIC_OR_DEPARTMENT_OTHER): Payer: BC Managed Care – PPO | Admitting: Hematology & Oncology

## 2011-01-20 ENCOUNTER — Other Ambulatory Visit: Payer: Self-pay | Admitting: Family

## 2011-01-20 DIAGNOSIS — D473 Essential (hemorrhagic) thrombocythemia: Secondary | ICD-10-CM

## 2011-01-20 DIAGNOSIS — F4321 Adjustment disorder with depressed mood: Secondary | ICD-10-CM

## 2011-01-20 DIAGNOSIS — R5381 Other malaise: Secondary | ICD-10-CM

## 2011-01-20 LAB — CBC WITH DIFFERENTIAL (CANCER CENTER ONLY)
BASO#: 0 10*3/uL (ref 0.0–0.2)
BASO%: 0.8 % (ref 0.0–2.0)
EOS%: 2.5 % (ref 0.0–7.0)
Eosinophils Absolute: 0.1 10*3/uL (ref 0.0–0.5)
HCT: 40.1 % (ref 34.8–46.6)
HGB: 14.4 g/dL (ref 11.6–15.9)
LYMPH#: 2 10*3/uL (ref 0.9–3.3)
LYMPH%: 38.9 % (ref 14.0–48.0)
MCH: 39.9 pg — ABNORMAL HIGH (ref 26.0–34.0)
MCHC: 35.9 g/dL (ref 32.0–36.0)
MCV: 111 fL — ABNORMAL HIGH (ref 81–101)
MONO#: 0.5 10*3/uL (ref 0.1–0.9)
MONO%: 8.8 % (ref 0.0–13.0)
NEUT#: 2.6 10*3/uL (ref 1.5–6.5)
NEUT%: 49 % (ref 39.6–80.0)
Platelets: 610 10*3/uL — ABNORMAL HIGH (ref 145–400)
RBC: 3.61 10*6/uL — ABNORMAL LOW (ref 3.70–5.32)
RDW: 11.2 % (ref 11.1–15.7)
WBC: 5.2 10*3/uL (ref 3.9–10.0)

## 2011-01-20 MED ORDER — ALPRAZOLAM 0.25 MG PO TABS: 0.2500 mg | ORAL_TABLET | Freq: Three times a day (TID) | ORAL | Status: DC | PRN

## 2011-01-20 NOTE — Patient Instructions (Addendum)
Stop clorazepate (tranzene) Keep your upcoming appointment.  Welcome to [facility].

## 2011-01-20 NOTE — Progress Notes (Signed)
Subjective:    Patient ID: Rachael Johnson, female    DOB: 08-14-1950, 60 y.o.   MRN: [id number]  HPI  Rachael Johnson is a 60 year old female new to establish care who presents today following the death of her husband.  He suffered a heart attack while driving.  Sons are staying with her.  Her grand children are with her. She works at [employer].  She is requesting a note from today until July 9th.    Subjective:   Rachael Johnson is an 60 y.o. female who presents for evaluation and treatment of depressive symptoms.  Onset approximately 2 weeks ago, unchanged since that time.  Current symptoms include insomnia, disturbed sleep and decreased appetite.  Current treatment for depression:None Sleep problems: Marked   Early awakening:Marked   Energy: Poor Motivation: Fair Concentration: Fair Rumination/worrying: Moderate Memory: Good Tearfulness: Marked  Anxiety: Moderate  Panic: Mild  Overall Mood: Moderately worse  Hopelessness: Absent Suicidal ideation: Absent  Other/Psychosocial Stressors:  Death of husband 2 weeks ago.  Family history positive for depression in the patient's none.  Previous treatment modalities employed include None.  Past episodes of depression:no Organic causes of depression present: None.  Review of Systems Pertinent items are noted in HPI.   Objective:     Assessment:    Plan:   No diagnosis found.  Review of Systems     Past Medical History  Diagnosis Date  . Arthritis   . Allergy   . Thyroid disease   . Diverticulitis   . Frequent episodic tension-type headache   . Hypertension   . Seizure     childhood    History   Social History  . Marital Status: Married    Spouse Name: N/A    Number of Children: 2  . Years of Education: N/A   Occupational History  . APL operator     [employer]   Social History Main Topics  . Smoking status: Former Smoker -- 3.5 packs/day for 5 years    Types: Cigarettes    Quit date: 07/31/1990  .  Smokeless tobacco: Not on file  . Alcohol Use: No  . Drug Use: No  . Sexually Active: Not on file   Other Topics Concern  . Not on file   Social History Narrative   Regular exercise: noCaffeine Use: 2-3 weekly    Past Surgical History  Procedure Date  . Abdominal hysterectomy     1992  . Breast surgery 1992    biopsy, benign. Fibrocystic    Family History  Problem Relation Age of Onset  . Arthritis Mother   . Cancer Mother     ovarian  . Hypertension Mother   . Heart disease Mother   . Arthritis Father   . Cancer Father     lung  . Hypertension Father   . Cancer Sister     lung  . Cancer Brother     lung  . Cancer Brother     prostate    No Known Allergies  No current outpatient prescriptions on file prior to visit.    BP 130/80  Pulse 78  Temp(Src) 98 F (36.7 C) (Oral)  Resp 16  Wt 163 lb 1.9 oz (73.991 kg)  LMP 07/31/1990    Objective:   Physical Exam  Constitutional: She appears well-developed and well-nourished.       Tearful tired appearing white female.   Cardiovascular: Normal rate and regular rhythm.   Pulmonary/Chest: Effort normal  and breath sounds normal.  Psychiatric: Judgment and thought content normal. Cognition and memory are normal.       Tearful, but appropriate. Mildly anxious appearing.          Assessment & Plan:

## 2011-01-20 NOTE — Assessment & Plan Note (Signed)
Will plan to treat with PRN xanax for anxiety and sleep.  Hopefully, this will be a short term treatment. I did recommend that she seek professional grief counseling. She declines at this time, "I just need some time to myself and I can work through this."  25 minutes spent with patient today>50% of this time was spent counseling pt on her grief reaction.

## 2011-01-24 ENCOUNTER — Encounter: Payer: Self-pay | Admitting: Family

## 2011-01-24 ENCOUNTER — Telehealth: Payer: Self-pay | Admitting: *Deleted

## 2011-01-24 DIAGNOSIS — D75839 Thrombocytosis, unspecified: Secondary | ICD-10-CM | POA: Insufficient documentation

## 2011-01-24 DIAGNOSIS — Z0279 Encounter for issue of other medical certificate: Secondary | ICD-10-CM

## 2011-01-24 DIAGNOSIS — D473 Essential (hemorrhagic) thrombocythemia: Secondary | ICD-10-CM

## 2011-01-24 HISTORY — DX: Essential (hemorrhagic) thrombocythemia: D47.3

## 2011-01-24 NOTE — Telephone Encounter (Signed)
Pt returned phone call and would like to talk to you regarding the FMLA papers. Best# [phone]

## 2011-01-24 NOTE — Telephone Encounter (Signed)
Spoke with pt, she requests that I fax FMLA form to her HR Manager, Joanell Rising at [phone] and mail original to pt. Form faxed and mailed.

## 2011-01-24 NOTE — Telephone Encounter (Signed)
Left message on home phone to return my call. FMLA papers are at front desk for pick up.

## 2011-01-24 NOTE — Telephone Encounter (Signed)
Pt called back stating that she will pick up papers on Thursday 6.28.12

## 2011-01-26 ENCOUNTER — Telehealth: Payer: Self-pay | Admitting: *Deleted

## 2011-01-26 NOTE — Telephone Encounter (Signed)
Form received from [employer] requesting additional form completion for leave of absence through 02/06/11 and forwarded to Provider for completion and signature.

## 2011-01-31 NOTE — Telephone Encounter (Signed)
Form was signed and faxed back to [employer] on 01/30/11 and forwarded to MR for scanning.

## 2011-02-06 ENCOUNTER — Other Ambulatory Visit: Payer: Self-pay | Admitting: Hematology & Oncology

## 2011-02-06 ENCOUNTER — Ambulatory Visit (INDEPENDENT_AMBULATORY_CARE_PROVIDER_SITE_OTHER): Payer: BC Managed Care – PPO | Admitting: Family

## 2011-02-06 ENCOUNTER — Ambulatory Visit: Payer: BC Managed Care – PPO | Admitting: Internal Medicine

## 2011-02-06 ENCOUNTER — Encounter (HOSPITAL_BASED_OUTPATIENT_CLINIC_OR_DEPARTMENT_OTHER): Payer: BC Managed Care – PPO | Admitting: Hematology & Oncology

## 2011-02-06 ENCOUNTER — Encounter: Payer: Self-pay | Admitting: Family

## 2011-02-06 DIAGNOSIS — R5381 Other malaise: Secondary | ICD-10-CM

## 2011-02-06 DIAGNOSIS — R5383 Other fatigue: Secondary | ICD-10-CM

## 2011-02-06 DIAGNOSIS — E039 Hypothyroidism, unspecified: Secondary | ICD-10-CM | POA: Insufficient documentation

## 2011-02-06 DIAGNOSIS — I10 Essential (primary) hypertension: Secondary | ICD-10-CM

## 2011-02-06 DIAGNOSIS — D473 Essential (hemorrhagic) thrombocythemia: Secondary | ICD-10-CM

## 2011-02-06 DIAGNOSIS — F4321 Adjustment disorder with depressed mood: Secondary | ICD-10-CM

## 2011-02-06 LAB — CBC WITH DIFFERENTIAL (CANCER CENTER ONLY)
BASO#: 0 10*3/uL (ref 0.0–0.2)
BASO%: 1 % (ref 0.0–2.0)
EOS%: 1.5 % (ref 0.0–7.0)
Eosinophils Absolute: 0.1 10*3/uL (ref 0.0–0.5)
HCT: 37.8 % (ref 34.8–46.6)
HGB: 13.5 g/dL (ref 11.6–15.9)
LYMPH#: 1.2 10*3/uL (ref 0.9–3.3)
LYMPH%: 31.3 % (ref 14.0–48.0)
MCH: 39.4 pg — ABNORMAL HIGH (ref 26.0–34.0)
MCHC: 35.7 g/dL (ref 32.0–36.0)
MCV: 110 fL — ABNORMAL HIGH (ref 81–101)
MONO#: 0.3 10*3/uL (ref 0.1–0.9)
MONO%: 7.7 % (ref 0.0–13.0)
NEUT#: 2.3 10*3/uL (ref 1.5–6.5)
NEUT%: 58.5 % (ref 39.6–80.0)
Platelets: 464 10*3/uL — ABNORMAL HIGH (ref 145–400)
RBC: 3.43 10*6/uL — ABNORMAL LOW (ref 3.70–5.32)
RDW: 11.2 % (ref 11.1–15.7)
WBC: 3.9 10*3/uL (ref 3.9–10.0)

## 2011-02-06 LAB — BASIC METABOLIC PANEL
BUN: 20 mg/dL (ref 6–23)
CO2: 28 mEq/L (ref 19–32)
Calcium: 10.2 mg/dL (ref 8.4–10.5)
Chloride: 101 mEq/L (ref 96–112)
Creat: 0.87 mg/dL (ref 0.50–1.10)
Glucose, Bld: 77 mg/dL (ref 70–99)
Potassium: 4 mEq/L (ref 3.5–5.3)
Sodium: 139 mEq/L (ref 135–145)

## 2011-02-06 LAB — TSH: TSH: 1.941 u[IU]/mL (ref 0.350–4.500)

## 2011-02-06 LAB — CHCC SATELLITE - SMEAR

## 2011-02-06 MED ORDER — OMEPRAZOLE 40 MG PO CPDR: 40.0000 mg | DELAYED_RELEASE_CAPSULE | Freq: Every day | ORAL | Status: DC

## 2011-02-06 MED ORDER — ALPRAZOLAM 0.25 MG PO TABS: 0.2500 mg | ORAL_TABLET | Freq: Three times a day (TID) | ORAL | Status: DC | PRN

## 2011-02-06 MED ORDER — ACETAMINOPHEN-CODEINE 300-30 MG PO TABS: 1.0000 | ORAL_TABLET | ORAL | Status: DC | PRN

## 2011-02-06 MED ORDER — APAP-CODEINE & DIET MANAGE PR 300-30 MG PO MISC: 1.0000 | Freq: Three times a day (TID) | ORAL | Status: DC | PRN

## 2011-02-06 NOTE — Patient Instructions (Signed)
Please schedule a complete physical this fall. Have a nice summer!

## 2011-02-06 NOTE — Assessment & Plan Note (Signed)
Will check TSH

## 2011-02-06 NOTE — Progress Notes (Signed)
Subjective:    Patient ID: Rachael Johnson, female    DOB: [birth year], 60 y.o.   MRN: [id number]  HPI  Anxiety- she continues xanax TID. This is helping.  Still having some trouble concentrating, but this is improving.  Not sleeping well.  She slept 3 hours last night.  She would like to return to work on Monday.    GERD- reports that the aciphex is too expensive.  Wants an alternative.    Bilateral leg pain- uses naproxen for the knees.  She reports that she is on tylenol with codiene PRN for the last 1 year.    Review of Systems See HPI  Past Medical History  Diagnosis Date  . Arthritis   . Allergy   . Thyroid disease   . Diverticulitis   . Frequent episodic tension-type headache   . Hypertension   . Seizure     childhood  . Essential thrombocythemia 01/24/2011    History   Social History  . Marital Status: Widowed    Spouse Name: N/A    Number of Children: 2  . Years of Education: N/A   Occupational History  . APL operator     [employer]   Social History Main Topics  . Smoking status: Former Smoker -- 3.5 packs/day for 5 years    Types: Cigarettes    Quit date: 07/31/1990  . Smokeless tobacco: Not on file  . Alcohol Use: No  . Drug Use: No  . Sexually Active: Not on file   Other Topics Concern  . Not on file   Social History Narrative   Regular exercise: noCaffeine Use: 2-3 weekly    Past Surgical History  Procedure Date  . Abdominal hysterectomy     1992  . Breast surgery 1992    biopsy, benign. Fibrocystic    Family History  Problem Relation Age of Onset  . Arthritis Mother   . Cancer Mother     ovarian  . Hypertension Mother   . Heart disease Mother   . Arthritis Father   . Cancer Father     lung  . Hypertension Father   . Cancer Sister     lung  . Cancer Brother     lung  . Cancer Brother     prostate    No Known Allergies  Current Outpatient Prescriptions on File Prior to Visit  Medication Sig Dispense Refill  . aspirin 81  MG tablet Take 81 mg by mouth daily.        . ergocalciferol (VITAMIN D2) 50000 UNITS capsule Take 1 capsule every other week.       . estradiol (ESTRACE) 0.5 MG tablet Take 0.5 mg by mouth 2 (two) times daily.        . furosemide (LASIX) 20 MG tablet Take 20 mg by mouth every morning.        . hydrochlorothiazide (,MICROZIDE/HYDRODIURIL,) 12.5 MG capsule Take 12.5 mg by mouth daily.        . hydroxyurea (HYDREA) 500 MG capsule Take 500 mg by mouth 2 (two) times daily. May take with food to minimize GI side effects.       Marland Kitchen levothyroxine (SYNTHROID, LEVOTHROID) 88 MCG tablet Take 88 mcg by mouth daily.        . Multiple Vitamin (MULTIVITAMIN PO) Take 1 tablet by mouth daily.        . naproxen (NAPROSYN) 500 MG tablet Take 500 mg by mouth 2 (two) times daily with a meal.        .  RABEprazole (ACIPHEX) 20 MG tablet Take 20 mg by mouth daily.        Marland Kitchen DISCONTD: ALPRAZolam (XANAX) 0.25 MG tablet Take 1 tablet (0.25 mg total) by mouth 3 (three) times daily as needed for anxiety.  30 tablet  0  . DISCONTD: APAP-Codeine & Diet Manage Pr 300-30 MG MISC Take 1 tablet by mouth every 4 (four) hours as needed.          BP 100/60  Pulse 72  Temp(Src) 97.8 F (36.6 C) (Oral)  Resp 16  Wt 160 lb 1.9 oz (72.63 kg)  LMP 07/31/1990       Objective:   Physical Exam  Constitutional: She appears well-developed and well-nourished.  Psychiatric: She has a normal mood and affect. Her behavior is normal. Judgment and thought content normal.       Mild tearfulness today at times, but overall more composed today.          Assessment & Plan:  20 minutes spent with pt.  >50% of this time was spent counseling pt on her anxiety/grief reaction.

## 2011-02-06 NOTE — Assessment & Plan Note (Signed)
This is improving.  Continue the xanax prn.  May be able to wean down in a few months.

## 2011-02-07 ENCOUNTER — Encounter: Payer: Self-pay | Admitting: Family

## 2011-02-07 ENCOUNTER — Telehealth: Payer: Self-pay | Admitting: Family

## 2011-02-07 ENCOUNTER — Other Ambulatory Visit: Payer: Self-pay | Admitting: *Deleted

## 2011-02-07 LAB — RETICULOCYTES (CHCC)
ABS Retic: 62.5 10*3/uL (ref 19.0–186.0)
RBC.: 3.47 MIL/uL — ABNORMAL LOW (ref 3.87–5.11)
Retic Ct Pct: 1.8 % (ref 0.4–2.3)

## 2011-02-07 LAB — FERRITIN: Ferritin: 123 ng/mL (ref 10–291)

## 2011-02-07 LAB — VITAMIN D 25 HYDROXY (VIT D DEFICIENCY, FRACTURES): Vit D, 25-Hydroxy: 46 ng/mL (ref 30–89)

## 2011-02-07 NOTE — Telephone Encounter (Signed)
Faxed letter to [phone] clearing pt to return to work on 02/13/11.

## 2011-02-07 NOTE — Telephone Encounter (Signed)
Received message from pharmacy needing clarification of pt's Tylenol w/codeine rxs. They received 2 rx yesterday with different directions. 1 says every 4 hours as needed and the other says three times daily as needed. Please advise which directions are correct?

## 2011-02-07 NOTE — Telephone Encounter (Signed)
Should read 3x daily as needed.

## 2011-02-07 NOTE — Telephone Encounter (Signed)
Spoke to [facility]. She states they filled Tylenol w/cod rx with directions of every 4 hours on yesterday. Advised her to remove remaining refills and cancel 2nd rx they received with three times daily dosing as pt has already picked up rx. Future direction will be three times a day and correction has been made to the medication list.

## 2011-02-08 ENCOUNTER — Other Ambulatory Visit: Payer: Self-pay | Admitting: Hematology & Oncology

## 2011-02-08 DIAGNOSIS — M79604 Pain in right leg: Secondary | ICD-10-CM

## 2011-02-08 DIAGNOSIS — M79605 Pain in left leg: Secondary | ICD-10-CM

## 2011-02-21 ENCOUNTER — Encounter (HOSPITAL_COMMUNITY)
Admission: RE | Admit: 2011-02-21 | Discharge: 2011-02-21 | Disposition: A | Discharge status: Home / Self Care | Payer: BC Managed Care – PPO | Source: Ambulatory Visit | Attending: Hematology & Oncology | Admitting: Hematology & Oncology

## 2011-02-21 DIAGNOSIS — M79604 Pain in right leg: Secondary | ICD-10-CM

## 2011-02-21 DIAGNOSIS — M7989 Other specified soft tissue disorders: Secondary | ICD-10-CM | POA: Insufficient documentation

## 2011-02-21 DIAGNOSIS — M79605 Pain in left leg: Secondary | ICD-10-CM

## 2011-02-21 DIAGNOSIS — M79609 Pain in unspecified limb: Secondary | ICD-10-CM | POA: Insufficient documentation

## 2011-02-21 MED ORDER — TECHNETIUM TC 99M MEDRONATE IV KIT
24.0000 | PACK | Freq: Once | INTRAVENOUS | Status: AC | PRN
  Administered 2011-02-21: 24 via INTRAVENOUS

## 2011-03-03 ENCOUNTER — Other Ambulatory Visit: Payer: Self-pay | Admitting: *Deleted

## 2011-03-03 MED ORDER — ACETAMINOPHEN-CODEINE 300-30 MG PO TABS: 1.0000 | ORAL_TABLET | Freq: Three times a day (TID) | ORAL | Status: DC | PRN

## 2011-03-03 NOTE — Telephone Encounter (Signed)
Received fax from [facility] requesting refill of Tylenol #3. Spoke to [place] and verified that per 02/07/11 phone note, directions should be 1 tablet three times a day as needed. 7/10 rx was cancelled as pt had picked up refill with old directions of every four hours as needed. Gave refill as APAP/Codeine 1 tablet three times daily as needed #30 x no refills to [place].

## 2011-03-24 ENCOUNTER — Encounter (HOSPITAL_BASED_OUTPATIENT_CLINIC_OR_DEPARTMENT_OTHER): Payer: BC Managed Care – PPO | Admitting: Hematology & Oncology

## 2011-03-24 ENCOUNTER — Ambulatory Visit (INDEPENDENT_AMBULATORY_CARE_PROVIDER_SITE_OTHER): Payer: BC Managed Care – PPO | Admitting: Internal Medicine

## 2011-03-24 ENCOUNTER — Encounter: Payer: Self-pay | Admitting: Internal Medicine

## 2011-03-24 ENCOUNTER — Other Ambulatory Visit: Payer: Self-pay | Admitting: Hematology & Oncology

## 2011-03-24 DIAGNOSIS — G8929 Other chronic pain: Secondary | ICD-10-CM

## 2011-03-24 DIAGNOSIS — M159 Polyosteoarthritis, unspecified: Secondary | ICD-10-CM | POA: Insufficient documentation

## 2011-03-24 DIAGNOSIS — F419 Anxiety disorder, unspecified: Secondary | ICD-10-CM | POA: Insufficient documentation

## 2011-03-24 DIAGNOSIS — R5383 Other fatigue: Secondary | ICD-10-CM

## 2011-03-24 DIAGNOSIS — F329 Major depressive disorder, single episode, unspecified: Secondary | ICD-10-CM

## 2011-03-24 DIAGNOSIS — F32A Depression, unspecified: Secondary | ICD-10-CM

## 2011-03-24 DIAGNOSIS — D473 Essential (hemorrhagic) thrombocythemia: Secondary | ICD-10-CM

## 2011-03-24 DIAGNOSIS — R5381 Other malaise: Secondary | ICD-10-CM

## 2011-03-24 DIAGNOSIS — F418 Other specified anxiety disorders: Secondary | ICD-10-CM | POA: Insufficient documentation

## 2011-03-24 DIAGNOSIS — M25569 Pain in unspecified knee: Secondary | ICD-10-CM

## 2011-03-24 DIAGNOSIS — F411 Generalized anxiety disorder: Secondary | ICD-10-CM

## 2011-03-24 HISTORY — DX: Polyosteoarthritis, unspecified: M15.9

## 2011-03-24 HISTORY — DX: Other specified anxiety disorders: F41.8

## 2011-03-24 LAB — CBC WITH DIFFERENTIAL (CANCER CENTER ONLY)
BASO#: 0 10*3/uL (ref 0.0–0.2)
BASO%: 0.5 % (ref 0.0–2.0)
EOS%: 2.3 % (ref 0.0–7.0)
Eosinophils Absolute: 0.1 10*3/uL (ref 0.0–0.5)
HCT: 37.7 % (ref 34.8–46.6)
HGB: 13.3 g/dL (ref 11.6–15.9)
LYMPH#: 1.3 10*3/uL (ref 0.9–3.3)
LYMPH%: 32.6 % (ref 14.0–48.0)
MCH: 38.4 pg — ABNORMAL HIGH (ref 26.0–34.0)
MCHC: 35.3 g/dL (ref 32.0–36.0)
MCV: 109 fL — ABNORMAL HIGH (ref 81–101)
MONO#: 0.3 10*3/uL (ref 0.1–0.9)
MONO%: 8.6 % (ref 0.0–13.0)
NEUT#: 2.2 10*3/uL (ref 1.5–6.5)
NEUT%: 56 % (ref 39.6–80.0)
Platelets: 496 10*3/uL — ABNORMAL HIGH (ref 145–400)
RBC: 3.46 10*6/uL — ABNORMAL LOW (ref 3.70–5.32)
RDW: 12.7 % (ref 11.1–15.7)
WBC: 3.8 10*3/uL — ABNORMAL LOW (ref 3.9–10.0)

## 2011-03-24 LAB — CHCC SATELLITE - SMEAR

## 2011-03-24 MED ORDER — OMEPRAZOLE 40 MG PO CPDR: 40.0000 mg | DELAYED_RELEASE_CAPSULE | Freq: Every day | ORAL | Status: DC

## 2011-03-24 MED ORDER — CITALOPRAM HYDROBROMIDE 20 MG PO TABS: 20.0000 mg | ORAL_TABLET | Freq: Every day | ORAL | Status: DC

## 2011-03-24 MED ORDER — LEVOTHYROXINE SODIUM 88 MCG PO TABS: 88.0000 ug | ORAL_TABLET | Freq: Every day | ORAL | Status: DC

## 2011-03-24 MED ORDER — ESTRADIOL 0.5 MG PO TABS: 0.5000 mg | ORAL_TABLET | Freq: Two times a day (BID) | ORAL | Status: DC

## 2011-03-24 MED ORDER — ACETAMINOPHEN-CODEINE 300-30 MG PO TABS: 1.0000 | ORAL_TABLET | Freq: Three times a day (TID) | ORAL | Status: DC | PRN

## 2011-03-24 MED ORDER — FUROSEMIDE 20 MG PO TABS: 20.0000 mg | ORAL_TABLET | ORAL | Status: DC

## 2011-03-24 MED ORDER — NAPROXEN 500 MG PO TABS: 500.0000 mg | ORAL_TABLET | Freq: Two times a day (BID) | ORAL | Status: DC

## 2011-03-24 MED ORDER — HYDROCHLOROTHIAZIDE 12.5 MG PO CAPS: 12.5000 mg | ORAL_CAPSULE | Freq: Every day | ORAL | Status: DC

## 2011-03-24 NOTE — Assessment & Plan Note (Signed)
Stable. Continue nsaid with food and no other nsaids. Aware of potential for gi adverse effeccts. chem7 nl 7/12. rf tylenol with codeine prn use. Aware of potential for addiction and/or tolerance.

## 2011-03-24 NOTE — Assessment & Plan Note (Signed)
Do not recommend dose increase. Begin ssri as above. Encouraged sparing use. Aware of potential for addiction and/or tolerance.

## 2011-03-24 NOTE — Progress Notes (Signed)
  Subjective:    Patient ID: Rachael Johnson, female    DOB: 05-05-1951, 60 y.o.   MRN: [id number]  HPI Pt presents to clinic for followup of multiple medical problems. Notes continued anxiety and depression stemming from the death of her husband. Takes xanax prn and feels suboptimal results from current dose. No side effects. H/o thrombocythemia followed by H/O with recent improvement of plt count to 464. Aciphex changed to omeprazole for cost consideration and gerd sx's controlled. Suffers from chronic pain from knee oa worse at the end of day after work. No injury/trauma. Takes tylenol with codeine chronically. [place] narcotic database reviewed with pattern of single providers and no pattern of early fills or abuse. No other complaints.  Past Medical History  Diagnosis Date  . Arthritis   . Allergy   . Thyroid disease   . Diverticulitis   . Frequent episodic tension-type headache   . Hypertension   . Seizure     childhood  . Essential thrombocythemia 01/24/2011   Past Surgical History  Procedure Date  . Abdominal hysterectomy     1992  . Breast surgery 1992    biopsy, benign. Fibrocystic    reports that she quit smoking about 20 years ago. Her smoking use included Cigarettes. She has a 17.5 pack-year smoking history. She does not have any smokeless tobacco history on file. She reports that she does not drink alcohol or use illicit drugs. family history includes Arthritis in her father and mother; Cancer in her brothers, father, mother, and sister; Heart disease in her mother; and Hypertension in her father and mother. No Known Allergies  Review of Systems see hpi     Objective:   Physical Exam  Physical Exam  Nursing note and vitals reviewed. Constitutional: Appears well-developed and well-nourished. No distress.  HENT:  Head: Normocephalic and atraumatic.  Right Ear: External ear normal.  Left Ear: External ear normal.  Eyes: Conjunctivae are normal. No scleral icterus.  Neck:  Neck supple. Carotid bruit is not present.  Cardiovascular: Normal rate, regular rhythm and normal heart sounds.  Exam reveals no gallop and no friction rub.   No murmur heard. Pulmonary/Chest: Effort normal and breath sounds normal. No respiratory distress. He has no wheezes. no rales.  Lymphadenopathy:    He has no cervical adenopathy.  Neurological:Alert.  Skin: Skin is warm and dry. Not diaphoretic.  Psychiatric: Has a normal mood and affect.        Assessment & Plan:

## 2011-03-24 NOTE — Assessment & Plan Note (Signed)
Begin celexa 20mg  po qd. Declines therapist referral. F/u 4 wks or sooner if necessary.

## 2011-03-25 LAB — RETICULOCYTES (CHCC)
ABS Retic: 57.1 10*3/uL (ref 19.0–186.0)
RBC.: 3.57 MIL/uL — ABNORMAL LOW (ref 3.87–5.11)
Retic Ct Pct: 1.6 % (ref 0.4–2.3)

## 2011-03-25 LAB — VITAMIN D 25 HYDROXY (VIT D DEFICIENCY, FRACTURES): Vit D, 25-Hydroxy: 50 ng/mL (ref 30–89)

## 2011-03-25 LAB — IRON AND TIBC
%SAT: 19 % — ABNORMAL LOW (ref 20–55)
Iron: 50 ug/dL (ref 42–145)
TIBC: 262 ug/dL (ref 250–470)
UIBC: 212 ug/dL

## 2011-03-25 LAB — FERRITIN: Ferritin: 110 ng/mL (ref 10–291)

## 2011-04-07 ENCOUNTER — Encounter (HOSPITAL_BASED_OUTPATIENT_CLINIC_OR_DEPARTMENT_OTHER): Payer: BC Managed Care – PPO | Admitting: Hematology & Oncology

## 2011-04-07 DIAGNOSIS — D473 Essential (hemorrhagic) thrombocythemia: Secondary | ICD-10-CM

## 2011-04-26 ENCOUNTER — Encounter: Payer: Self-pay | Admitting: Internal Medicine

## 2011-04-26 ENCOUNTER — Ambulatory Visit (INDEPENDENT_AMBULATORY_CARE_PROVIDER_SITE_OTHER): Payer: BC Managed Care – PPO | Admitting: Internal Medicine

## 2011-04-26 DIAGNOSIS — R031 Nonspecific low blood-pressure reading: Secondary | ICD-10-CM | POA: Insufficient documentation

## 2011-04-26 DIAGNOSIS — F32A Depression, unspecified: Secondary | ICD-10-CM

## 2011-04-26 DIAGNOSIS — F329 Major depressive disorder, single episode, unspecified: Secondary | ICD-10-CM

## 2011-04-26 MED ORDER — CITALOPRAM HYDROBROMIDE 40 MG PO TABS: 40.0000 mg | ORAL_TABLET | Freq: Every day | ORAL | Status: DC

## 2011-04-26 MED ORDER — ALPRAZOLAM 0.25 MG PO TABS: 0.2500 mg | ORAL_TABLET | Freq: Three times a day (TID) | ORAL | Status: DC | PRN

## 2011-04-26 NOTE — Assessment & Plan Note (Signed)
Hold hctz x 4 days then resume and monitor bp.

## 2011-04-26 NOTE — Progress Notes (Signed)
  Subjective:    Patient ID: Rachael Johnson, female    DOB: 1951-03-08, 60 y.o.   MRN: [id number]  HPI Pt presents to clinic for followup of multiple medical problems. Tolerating celexa 20mg  daily without side effect. Feels improved mood and less depression and anxiety. Still takes xanax prn predominantly on the weekends. BP low nl without dizziness, presyncope or syncope. No other complaints.  Past Medical History  Diagnosis Date  . Arthritis   . Allergy   . Thyroid disease   . Diverticulitis   . Frequent episodic tension-type headache   . Hypertension   . Seizure     childhood  . Essential thrombocythemia 01/24/2011   Past Surgical History  Procedure Date  . Abdominal hysterectomy     1992  . Breast surgery 1992    biopsy, benign. Fibrocystic    reports that she quit smoking about 20 years ago. Her smoking use included Cigarettes. She has a 17.5 pack-year smoking history. She has never used smokeless tobacco. She reports that she does not drink alcohol or use illicit drugs. family history includes Arthritis in her father and mother; Cancer in her brothers, father, mother, and sister; Heart disease in her mother; and Hypertension in her father and mother. No Known Allergies   Review of Systems see hpi     Objective:   Physical Exam  Physical Exam  Nursing note and vitals reviewed. Constitutional: Appears well-developed and well-nourished. No distress.  HENT:  Head: Normocephalic and atraumatic.  Right Ear: External ear normal.  Left Ear: External ear normal.  Eyes: Conjunctivae are normal. No scleral icterus.  Neck: Neck supple. Carotid bruit is not present.  Cardiovascular: Normal rate, regular rhythm and normal heart sounds.  Exam reveals no gallop and no friction rub.   No murmur heard. Pulmonary/Chest: Effort normal and breath sounds normal. No respiratory distress. He has no wheezes. no rales.  Lymphadenopathy:    He has no cervical adenopathy.  Neurological:Alert.   Skin: Skin is warm and dry. Not diaphoretic.  Psychiatric: Has a normal mood and affect.        Assessment & Plan:

## 2011-04-26 NOTE — Patient Instructions (Signed)
Please schedule lipid (chol screening), tsh,free t4 (hypothyroidism), chem7/lft (v58.69) and vit d (vit d deficiency) prior to next visit

## 2011-04-26 NOTE — Assessment & Plan Note (Signed)
Improving. No si/hi. Increase celexa 40mg  po qd.

## 2011-04-28 ENCOUNTER — Other Ambulatory Visit: Payer: Self-pay | Admitting: Internal Medicine

## 2011-04-28 DIAGNOSIS — E039 Hypothyroidism, unspecified: Secondary | ICD-10-CM

## 2011-04-28 DIAGNOSIS — Z79899 Other long term (current) drug therapy: Secondary | ICD-10-CM

## 2011-04-28 DIAGNOSIS — Z13220 Encounter for screening for lipoid disorders: Secondary | ICD-10-CM

## 2011-05-19 ENCOUNTER — Other Ambulatory Visit: Payer: Self-pay | Admitting: Hematology & Oncology

## 2011-05-19 ENCOUNTER — Encounter (HOSPITAL_BASED_OUTPATIENT_CLINIC_OR_DEPARTMENT_OTHER): Payer: BC Managed Care – PPO | Admitting: Hematology & Oncology

## 2011-05-19 DIAGNOSIS — D473 Essential (hemorrhagic) thrombocythemia: Secondary | ICD-10-CM

## 2011-05-19 DIAGNOSIS — R5381 Other malaise: Secondary | ICD-10-CM

## 2011-05-19 DIAGNOSIS — Z23 Encounter for immunization: Secondary | ICD-10-CM

## 2011-05-19 LAB — CBC WITH DIFFERENTIAL (CANCER CENTER ONLY)
BASO#: 0 10*3/uL (ref 0.0–0.2)
BASO%: 0.8 % (ref 0.0–2.0)
EOS%: 4.3 % (ref 0.0–7.0)
Eosinophils Absolute: 0.2 10*3/uL (ref 0.0–0.5)
HCT: 34.3 % — ABNORMAL LOW (ref 34.8–46.6)
HGB: 12.5 g/dL (ref 11.6–15.9)
LYMPH#: 1.3 10*3/uL (ref 0.9–3.3)
LYMPH%: 34 % (ref 14.0–48.0)
MCH: 40.1 pg — ABNORMAL HIGH (ref 26.0–34.0)
MCHC: 36.4 g/dL — ABNORMAL HIGH (ref 32.0–36.0)
MCV: 110 fL — ABNORMAL HIGH (ref 81–101)
MONO#: 0.4 10*3/uL (ref 0.1–0.9)
MONO%: 9.1 % (ref 0.0–13.0)
NEUT#: 2 10*3/uL (ref 1.5–6.5)
NEUT%: 51.8 % (ref 39.6–80.0)
Platelets: 457 10*3/uL — ABNORMAL HIGH (ref 145–400)
RBC: 3.12 10*6/uL — ABNORMAL LOW (ref 3.70–5.32)
RDW: 13.5 % (ref 11.1–15.7)
WBC: 3.9 10*3/uL (ref 3.9–10.0)

## 2011-05-19 LAB — RETICULOCYTES (CHCC)
ABS Retic: 46.2 10*3/uL (ref 19.0–186.0)
RBC.: 3.3 MIL/uL — ABNORMAL LOW (ref 3.87–5.11)
Retic Ct Pct: 1.4 % (ref 0.4–2.3)

## 2011-05-19 LAB — VITAMIN D 25 HYDROXY (VIT D DEFICIENCY, FRACTURES): Vit D, 25-Hydroxy: 52 ng/mL (ref 30–89)

## 2011-05-19 LAB — CHCC SATELLITE - SMEAR

## 2011-05-19 LAB — FERRITIN: Ferritin: 280 ng/mL (ref 10–291)

## 2011-05-30 ENCOUNTER — Telehealth: Payer: Self-pay | Admitting: Internal Medicine

## 2011-05-30 NOTE — Telephone Encounter (Signed)
refiil alprazolam 0.25 mg tablet take 1 tablet by mouth three times a day as needed for anxiety last fill 04-26-11 qty 90

## 2011-06-02 ENCOUNTER — Telehealth: Payer: Self-pay | Admitting: Internal Medicine

## 2011-06-02 NOTE — Telephone Encounter (Signed)
ALPRAZOLAM 0.25MG  TO [facility] IN [place]   SHE IS OUT

## 2011-06-05 NOTE — Telephone Encounter (Signed)
Call placed to [facility] with pharmacist; she stated last refill was 04/26/2011 qty 90 TID-PRN.

## 2011-06-06 ENCOUNTER — Other Ambulatory Visit: Payer: Self-pay | Admitting: Internal Medicine

## 2011-06-06 NOTE — Telephone Encounter (Signed)
#  90  rf 1 

## 2011-06-07 MED ORDER — ALPRAZOLAM 0.25 MG PO TABS: 0.2500 mg | ORAL_TABLET | Freq: Three times a day (TID) | ORAL | Status: DC | PRN

## 2011-06-07 NOTE — Telephone Encounter (Signed)
Rx refill called to pharmacist Cala Bradford at [place].  Call placed to patient at [phone], she was informed of medication refill to pharmacy.

## 2011-06-07 NOTE — Telephone Encounter (Signed)
Patient called she is out please call or send in toda

## 2011-06-21 ENCOUNTER — Other Ambulatory Visit: Payer: Self-pay | Admitting: Internal Medicine

## 2011-06-21 NOTE — Telephone Encounter (Signed)
Rx refill denied patient advised to contact office.

## 2011-06-26 ENCOUNTER — Telehealth: Payer: Self-pay | Admitting: Internal Medicine

## 2011-06-26 NOTE — Telephone Encounter (Signed)
[facility] [place]  Refill tylenol 300

## 2011-06-26 NOTE — Telephone Encounter (Signed)
Is it okay to refill this medication for patient? 

## 2011-06-26 NOTE — Telephone Encounter (Signed)
ok 

## 2011-06-27 MED ORDER — ACETAMINOPHEN-CODEINE 300-30 MG PO TABS: 1.0000 | ORAL_TABLET | Freq: Three times a day (TID) | ORAL | Status: DC | PRN

## 2011-06-27 NOTE — Telephone Encounter (Signed)
Call placed to [facility] pharmacy at [phone], verbal refill provided to pharmacist.

## 2011-07-19 ENCOUNTER — Other Ambulatory Visit: Payer: Self-pay | Admitting: Hematology & Oncology

## 2011-07-19 ENCOUNTER — Ambulatory Visit (HOSPITAL_BASED_OUTPATIENT_CLINIC_OR_DEPARTMENT_OTHER): Payer: BC Managed Care – PPO | Admitting: Hematology & Oncology

## 2011-07-19 ENCOUNTER — Other Ambulatory Visit (HOSPITAL_BASED_OUTPATIENT_CLINIC_OR_DEPARTMENT_OTHER): Payer: BC Managed Care – PPO | Admitting: Lab

## 2011-07-19 VITALS — BP 123/74 | HR 56 | Temp 97.0°F | Ht 63.5 in | Wt 136.5 lb

## 2011-07-19 DIAGNOSIS — D473 Essential (hemorrhagic) thrombocythemia: Secondary | ICD-10-CM

## 2011-07-19 DIAGNOSIS — R5381 Other malaise: Secondary | ICD-10-CM

## 2011-07-19 LAB — CBC WITH DIFFERENTIAL (CANCER CENTER ONLY)
BASO#: 0 10*3/uL (ref 0.0–0.2)
BASO%: 0.8 % (ref 0.0–2.0)
EOS%: 3.5 % (ref 0.0–7.0)
Eosinophils Absolute: 0.1 10*3/uL (ref 0.0–0.5)
HCT: 35.1 % (ref 34.8–46.6)
HGB: 12 g/dL (ref 11.6–15.9)
LYMPH#: 1.7 10*3/uL (ref 0.9–3.3)
LYMPH%: 44.6 % (ref 14.0–48.0)
MCH: 39 pg — ABNORMAL HIGH (ref 26.0–34.0)
MCHC: 34.2 g/dL (ref 32.0–36.0)
MCV: 114 fL — ABNORMAL HIGH (ref 81–101)
MONO#: 0.3 10*3/uL (ref 0.1–0.9)
MONO%: 7.8 % (ref 0.0–13.0)
NEUT#: 1.6 10*3/uL (ref 1.5–6.5)
NEUT%: 43.3 % (ref 39.6–80.0)
Platelets: 467 10*3/uL — ABNORMAL HIGH (ref 145–400)
RBC: 3.08 10*6/uL — ABNORMAL LOW (ref 3.70–5.32)
RDW: 12.4 % (ref 11.1–15.7)
WBC: 3.7 10*3/uL — ABNORMAL LOW (ref 3.9–10.0)

## 2011-07-19 LAB — IRON AND TIBC
%SAT: 42 % (ref 20–55)
Iron: 90 ug/dL (ref 42–145)
TIBC: 214 ug/dL — ABNORMAL LOW (ref 250–470)
UIBC: 124 ug/dL — ABNORMAL LOW (ref 125–400)

## 2011-07-19 LAB — FERRITIN: Ferritin: 204 ng/mL (ref 10–291)

## 2011-07-19 LAB — RETICULOCYTES (CHCC)
ABS Retic: 50.9 10*3/uL (ref 19.0–186.0)
RBC.: 3.18 MIL/uL — ABNORMAL LOW (ref 3.87–5.11)
Retic Ct Pct: 1.6 % (ref 0.4–2.3)

## 2011-07-19 LAB — CHCC SATELLITE - SMEAR

## 2011-07-19 NOTE — Progress Notes (Signed)
This office note has been dictated.

## 2011-07-28 ENCOUNTER — Telehealth: Payer: Self-pay | Admitting: Internal Medicine

## 2011-07-28 MED ORDER — OMEPRAZOLE 40 MG PO CPDR: 40.0000 mg | DELAYED_RELEASE_CAPSULE | Freq: Every day | ORAL | Status: DC

## 2011-07-28 NOTE — Telephone Encounter (Signed)
Omeprazole 40 mg take 1 per day  Qty 90  [facility]

## 2011-07-28 NOTE — Telephone Encounter (Signed)
Refill sent to pharmacy.   

## 2011-07-28 NOTE — Telephone Encounter (Signed)
ok 

## 2011-07-28 NOTE — Telephone Encounter (Signed)
Tried to process refill of Omeprazole and received warning that concomitant use of Omeprazole and Celexa (greater than 20mg ) may cause QT prolongation. Is it ok to refill Omeprazole?

## 2011-08-03 ENCOUNTER — Other Ambulatory Visit: Payer: Self-pay | Admitting: Internal Medicine

## 2011-08-03 NOTE — Progress Notes (Signed)
CC:   [facility]  DIAGNOSES: 1. Essential thrombocythemia, JAK2 negative. 2. Intermittent iron deficiency anemia.  CURRENT THERAPY: 1. Hydrea 500 mg p.o. q.a.m. 2. IV iron as indicated.  INTERIM HISTORY:  [name] comes in for followup.  She is still having a tough time with her husband's passing.  He passed away in 02-Feb-2023.  He had a heart attack while driving.  The holidays have been very tough for her.  Thankfully, her family has been very strong.  We did go ahead and give her iron back in September.  When we saw her in October, her ferritin was 280.  She said she just feels tired.  She did have a lot of energy.  I am sure this is an element of depression.  PHYSICAL EXAM:  General:  This is a well-developed, well-nourished, white female in no obvious distress.  Vital Signs:  97, pulse 56, respiratory rate 16, blood pressure 123/74.  Weight is 137.  Head and Neck Exam:  Normocephalic, atraumatic skull.  There are no ocular or oral lesions.  No palpable cervical or supraclavicular lymph nodes. There is no scleral icterus.  Lungs:  Clear bilaterally.  Cardiac Exam: Regular rate and rhythm with normal S1, S2.  There are no murmurs, rubs, or bruits.  Abdominal Exam:  Soft with good bowel sounds.  There is no palpable abdominal mass.  There is no fluid wave.  There is no palpable hepatosplenomegaly.  Back Exam:  No tenderness over the spine, ribs, or hips.  Extremities:  No clubbing, cyanosis, or edema.  Neurological Exam:  No focal neurological deficits.  LABORATORY STUDIES:  White cell count is 3.7, hemoglobin 12, hematocrit 35, platelet count 467.  MCV is 114.  IMPRESSION:  Ms. Goosby is a 61 year old white female with essential thrombocythemia.  Her platelet count really is holding steady compared to 2 months ago.  We will recheck her iron studies.  Again, her MCV is 114.  This clearly showed she is taking the Hydrea.  This would be unusual for her to  have iron deficiency with such a high MCV.  I just feel bad for Ms. Loveland.  She has gone through a whole lot.  She really was dedicated to taking care of her husband.  I think we can probably get her back to see [place] after wintertime now.  I want to see her back in March.    ______________________________ Josph Macho, M.D. PRE/MEDQ  D:  07/19/2011  T:  07/19/2011  Job:  [id number]  ADDENDUM:  Ferritin is 204.  %Sat is 42.

## 2011-08-03 NOTE — Telephone Encounter (Signed)
Call placed to patient at [phone], no answer. A voice message was left for patient to return phone call regarding refill request. She was asked to inform the purpose of refill.

## 2011-08-03 NOTE — Telephone Encounter (Signed)
Rx refill denied, patient advised to contact office.

## 2011-08-04 NOTE — Telephone Encounter (Signed)
Verbal refill order provided to pharmacist.  

## 2011-08-04 NOTE — Telephone Encounter (Signed)
Ok as previous

## 2011-08-16 ENCOUNTER — Other Ambulatory Visit: Payer: Self-pay | Admitting: *Deleted

## 2011-08-16 DIAGNOSIS — D473 Essential (hemorrhagic) thrombocythemia: Secondary | ICD-10-CM

## 2011-08-16 MED ORDER — HYDROXYUREA 500 MG PO CAPS: 500.0000 mg | ORAL_CAPSULE | Freq: Every day | ORAL | Status: DC

## 2011-08-16 NOTE — Telephone Encounter (Signed)
Received refill request and phone call from patient asking to have Hydrea sent to [facility]. Will fax to [phone] once signed by Dr Myna Hidalgo.

## 2011-09-12 ENCOUNTER — Other Ambulatory Visit: Payer: Self-pay | Admitting: *Deleted

## 2011-09-12 NOTE — Telephone Encounter (Addendum)
Received refill request from [facility] for Vitamin D 50,000 iu. Will check with Dr Myna Hidalgo to see if he wants it refilled as her level was 52 in Oct of last  Year.  09/13/11 - Ok to refill per Dr Myna Hidalgo. Will epresribe to [facility].

## 2011-09-13 MED ORDER — ERGOCALCIFEROL 1.25 MG (50000 UT) PO CAPS: ORAL_CAPSULE | ORAL | Status: DC

## 2011-09-18 ENCOUNTER — Other Ambulatory Visit: Payer: Self-pay | Admitting: *Deleted

## 2011-09-18 DIAGNOSIS — E039 Hypothyroidism, unspecified: Secondary | ICD-10-CM

## 2011-09-18 DIAGNOSIS — Z13220 Encounter for screening for lipoid disorders: Secondary | ICD-10-CM

## 2011-09-18 LAB — BASIC METABOLIC PANEL
BUN: 19 mg/dL (ref 6–23)
CO2: 27 mEq/L (ref 19–32)
Calcium: 9 mg/dL (ref 8.4–10.5)
Chloride: 104 mEq/L (ref 96–112)
Creat: 0.71 mg/dL (ref 0.50–1.10)
Glucose, Bld: 83 mg/dL (ref 70–99)
Potassium: 4.1 mEq/L (ref 3.5–5.3)
Sodium: 139 mEq/L (ref 135–145)

## 2011-09-18 LAB — T4, FREE: Free T4: 1.14 ng/dL (ref 0.80–1.80)

## 2011-09-18 LAB — HEPATIC FUNCTION PANEL
ALT: 10 U/L (ref 0–35)
AST: 18 U/L (ref 0–37)
Albumin: 4 g/dL (ref 3.5–5.2)
Alkaline Phosphatase: 48 U/L (ref 39–117)
Bilirubin, Direct: 0.1 mg/dL (ref 0.0–0.3)
Indirect Bilirubin: 0.4 mg/dL (ref 0.0–0.9)
Total Bilirubin: 0.5 mg/dL (ref 0.3–1.2)
Total Protein: 6.2 g/dL (ref 6.0–8.3)

## 2011-09-18 LAB — LIPID PANEL
Cholesterol: 194 mg/dL (ref 0–200)
HDL: 63 mg/dL (ref >39)
LDL Cholesterol: 122 mg/dL — ABNORMAL HIGH (ref 0–99)
Total CHOL/HDL Ratio: 3.1 Ratio
Triglycerides: 47 mg/dL (ref <150)
VLDL: 9 mg/dL (ref 0–40)

## 2011-09-18 LAB — TSH: TSH: 1.303 u[IU]/mL (ref 0.350–4.500)

## 2011-09-19 LAB — VITAMIN D 25 HYDROXY (VIT D DEFICIENCY, FRACTURES): Vit D, 25-Hydroxy: 57 ng/mL (ref 30–89)

## 2011-09-27 ENCOUNTER — Ambulatory Visit (INDEPENDENT_AMBULATORY_CARE_PROVIDER_SITE_OTHER): Payer: BC Managed Care – PPO | Admitting: Internal Medicine

## 2011-09-27 ENCOUNTER — Encounter: Payer: Self-pay | Admitting: Internal Medicine

## 2011-09-27 DIAGNOSIS — G8929 Other chronic pain: Secondary | ICD-10-CM

## 2011-09-27 DIAGNOSIS — R031 Nonspecific low blood-pressure reading: Secondary | ICD-10-CM

## 2011-09-27 DIAGNOSIS — F329 Major depressive disorder, single episode, unspecified: Secondary | ICD-10-CM

## 2011-09-27 DIAGNOSIS — M25569 Pain in unspecified knee: Secondary | ICD-10-CM

## 2011-09-27 DIAGNOSIS — F32A Depression, unspecified: Secondary | ICD-10-CM

## 2011-09-27 MED ORDER — CITALOPRAM HYDROBROMIDE 40 MG PO TABS: ORAL_TABLET | ORAL | Status: DC

## 2011-09-27 MED ORDER — ACETAMINOPHEN-CODEINE #3 300-30 MG PO TABS: 1.0000 | ORAL_TABLET | Freq: Three times a day (TID) | ORAL | Status: DC | PRN

## 2011-09-27 MED ORDER — ALPRAZOLAM 0.25 MG PO TABS: 0.2500 mg | ORAL_TABLET | Freq: Three times a day (TID) | ORAL | Status: DC | PRN

## 2011-09-27 MED ORDER — OMEPRAZOLE 40 MG PO CPDR: 40.0000 mg | DELAYED_RELEASE_CAPSULE | Freq: Every day | ORAL | Status: DC

## 2011-09-27 MED ORDER — ESTRADIOL 0.5 MG PO TABS: 0.5000 mg | ORAL_TABLET | Freq: Two times a day (BID) | ORAL | Status: DC

## 2011-09-27 MED ORDER — LEVOTHYROXINE SODIUM 88 MCG PO TABS: 88.0000 ug | ORAL_TABLET | Freq: Every day | ORAL | Status: DC

## 2011-09-27 NOTE — Progress Notes (Signed)
  Subjective:    Patient ID: HOLLEY KOCUREK, female    DOB: September 17, 1950, 61 y.o.   MRN: [id number]  HPI Pt presents to clinic for followup of multiple medical problems. BP low nl but asx without dizziness. Requests refill of tylenol #3 for chronic knee pain. Mood improved with celexa.  Past Medical History  Diagnosis Date  . Arthritis   . Allergy   . Thyroid disease   . Diverticulitis   . Frequent episodic tension-type headache   . Hypertension   . Seizure     childhood  . Essential thrombocythemia 01/24/2011   Past Surgical History  Procedure Date  . Abdominal hysterectomy     1992  . Breast surgery 1992    biopsy, benign. Fibrocystic    reports that she quit smoking about 21 years ago. Her smoking use included Cigarettes. She has a 17.5 pack-year smoking history. She has never used smokeless tobacco. She reports that she does not drink alcohol or use illicit drugs. family history includes Arthritis in her father and mother; Cancer in her brothers, father, mother, and sister; Heart disease in her mother; and Hypertension in her father and mother. No Known Allergies    Review of Systems see hpi     Objective:   Physical Exam  Physical Exam  Nursing note and vitals reviewed. Constitutional: Appears well-developed and well-nourished. No distress.  HENT:  Head: Normocephalic and atraumatic.  Right Ear: External ear normal.  Left Ear: External ear normal.  Eyes: Conjunctivae are normal. No scleral icterus.  Neck: Neck supple. Carotid bruit is not present.  Cardiovascular: Normal rate, regular rhythm and normal heart sounds.  Exam reveals no gallop and no friction rub.   No murmur heard. Pulmonary/Chest: Effort normal and breath sounds normal. No respiratory distress. He has no wheezes. no rales.  Lymphadenopathy:    He has no cervical adenopathy.  Neurological:Alert.  Skin: Skin is warm and dry. Not diaphoretic.  Psychiatric: Has a normal mood and affect.          Assessment & Plan:

## 2011-10-01 NOTE — Assessment & Plan Note (Signed)
Stop hctz. Monitor bp

## 2011-10-01 NOTE — Assessment & Plan Note (Signed)
Improved. rf celexa

## 2011-10-01 NOTE — Assessment & Plan Note (Signed)
Stable. rf tylenol #3.

## 2011-10-04 ENCOUNTER — Telehealth: Payer: Self-pay | Admitting: Hematology & Oncology

## 2011-10-04 NOTE — Telephone Encounter (Signed)
Left pt message moved 3-8 to 3-19

## 2011-10-06 ENCOUNTER — Ambulatory Visit: Payer: BC Managed Care – PPO | Admitting: Hematology & Oncology

## 2011-10-06 ENCOUNTER — Other Ambulatory Visit: Payer: BC Managed Care – PPO | Admitting: Lab

## 2011-10-17 ENCOUNTER — Ambulatory Visit (HOSPITAL_BASED_OUTPATIENT_CLINIC_OR_DEPARTMENT_OTHER): Payer: BC Managed Care – PPO | Admitting: Hematology & Oncology

## 2011-10-17 ENCOUNTER — Other Ambulatory Visit (HOSPITAL_BASED_OUTPATIENT_CLINIC_OR_DEPARTMENT_OTHER): Payer: BC Managed Care – PPO | Admitting: Lab

## 2011-10-17 VITALS — BP 111/70 | HR 67 | Temp 97.3°F | Wt 133.0 lb

## 2011-10-17 DIAGNOSIS — F430 Acute stress reaction: Secondary | ICD-10-CM

## 2011-10-17 DIAGNOSIS — D509 Iron deficiency anemia, unspecified: Secondary | ICD-10-CM

## 2011-10-17 DIAGNOSIS — E039 Hypothyroidism, unspecified: Secondary | ICD-10-CM

## 2011-10-17 DIAGNOSIS — D473 Essential (hemorrhagic) thrombocythemia: Secondary | ICD-10-CM

## 2011-10-17 LAB — CBC WITH DIFFERENTIAL (CANCER CENTER ONLY)
BASO#: 0 10*3/uL (ref 0.0–0.2)
BASO%: 1.2 % (ref 0.0–2.0)
EOS%: 2.7 % (ref 0.0–7.0)
Eosinophils Absolute: 0.1 10*3/uL (ref 0.0–0.5)
HCT: 37.7 % (ref 34.8–46.6)
HGB: 12.7 g/dL (ref 11.6–15.9)
LYMPH#: 0.9 10*3/uL (ref 0.9–3.3)
LYMPH%: 27.4 % (ref 14.0–48.0)
MCH: 37.6 pg — ABNORMAL HIGH (ref 26.0–34.0)
MCHC: 33.7 g/dL (ref 32.0–36.0)
MCV: 112 fL — ABNORMAL HIGH (ref 81–101)
MONO#: 0.3 10*3/uL (ref 0.1–0.9)
MONO%: 7.8 % (ref 0.0–13.0)
NEUT#: 2 10*3/uL (ref 1.5–6.5)
NEUT%: 60.9 % (ref 39.6–80.0)
Platelets: 624 10*3/uL — ABNORMAL HIGH (ref 145–400)
RBC: 3.38 10*6/uL — ABNORMAL LOW (ref 3.70–5.32)
RDW: 12.3 % (ref 11.1–15.7)
WBC: 3.3 10*3/uL — ABNORMAL LOW (ref 3.9–10.0)

## 2011-10-17 LAB — IRON AND TIBC
%SAT: 35 % (ref 20–55)
Iron: 76 ug/dL (ref 42–145)
TIBC: 219 ug/dL — ABNORMAL LOW (ref 250–470)
UIBC: 143 ug/dL (ref 125–400)

## 2011-10-17 LAB — CHCC SATELLITE - SMEAR

## 2011-10-17 LAB — FERRITIN: Ferritin: 178 ng/mL (ref 10–291)

## 2011-10-17 LAB — LACTATE DEHYDROGENASE: LDH: 152 U/L (ref 94–250)

## 2011-10-17 NOTE — Progress Notes (Signed)
This office note has been dictated.

## 2011-10-17 NOTE — Progress Notes (Signed)
CC:   Marguarite Arbour, MD  DIAGNOSES: 1. Essential thrombocythemia-JAK2 negative. 2. Intermittent iron deficiency anemia.  CURRENT THERAPY: 1. Hydrea 500 mg p.o. daily. 2. IV iron as indicated.  INTERIM HISTORY:  Rachael Johnson comes in for her followup. Unfortunately, she is still having a tough time dealing with her husband's death.  He died last 02/25/2023, while driving his pickup truck.  He had a heart attack. He did have a coronary artery disease.  He had diabetes.  This has just been very difficult for her.  She is losing weight because of this.  I think because of the weight loss, her platelet count is on the way up. I think the platelet count is reflective of the stress that she is under.  She also has hypothyroidism.  This is being monitored by Dr. Rodena Medin.  She has had no obvious bleeding.  She has had no pain issues.  There has been no leg swelling.  Her appetite comes and goes.  She has had her medications adjusted a little bit to try to help her get through each day.  We are certainly praying hard for her.  PHYSICAL EXAM:  General: This is a thin white female in no obvious physical distress.  Vital signs: Show temperature 97.3, pulse 67, respiratory 16, blood pressure 101/70, and weight is 133.  Head and neck exam shows a normocephalic, atraumatic skull.  There are no ocular or oral lesions.  There are no palpable cervical or supraclavicular lymph nodes.  Lungs are clear bilaterally.  Cardiac examination:  Regular rhythm with a normal S1 and S2.  There are no murmurs, rubs, or bruits. Abdominal exam: Soft with good bowel sounds.  There is no palpable abdominal mass.  There is no fluid wave.  There is no palpable hepatosplenomegaly.  Extremities: Shows no clubbing, cyanosis, or edema. Neurological exam: Shows no focal neurological deficit.  Skin exam: No rashes, ecchymosis, or petechia.  LABORATORY STUDIES:  White cell count is 3.3, hemoglobin 12.7, hematocrit 37.7, and  platelet count 624.  MCV is 112.  IMPRESSION:  Rachael Johnson is a 61 year old white female with essential thrombocythemia.  Again, she is under a ton of stress because of her husband's death.  She is trying to get through each day, which actually has been very difficult for her.  I think, again, the platelets reflect the stress that she is under.  We will have her come back in a couple of months for followup.  I do not think we need any blood work in between visits.  Hopefully, her weight will start going back up as she begins to eat a little bit better.    ______________________________ Josph Macho, M.D. PRE/MEDQ  D:  10/17/2011  T:  10/17/2011  Job:  [id number]

## 2011-10-23 ENCOUNTER — Telehealth: Payer: Self-pay | Admitting: *Deleted

## 2011-10-23 NOTE — Telephone Encounter (Signed)
Called Chip Boer to let her know that  Her iron levels were good per dr. Myna Hidalgo.  Left message on personal voice mail.

## 2011-10-23 NOTE — Telephone Encounter (Signed)
Message copied by Anselm Jungling on Mon Oct 23, 2011  9:18 AM ------      Message from: Josph Macho      Created: Tue Oct 17, 2011  6:19 PM       Call - iron is good!!  We are praying for you.

## 2011-10-30 LAB — HM PAP SMEAR: HM Pap smear: NORMAL

## 2011-11-02 ENCOUNTER — Other Ambulatory Visit: Payer: Self-pay | Admitting: Internal Medicine

## 2011-11-02 NOTE — Telephone Encounter (Signed)
Rx refill sent to pharmacy. 

## 2011-11-21 ENCOUNTER — Other Ambulatory Visit: Payer: Self-pay | Admitting: Internal Medicine

## 2011-11-21 NOTE — Telephone Encounter (Signed)
Rx refill sent to pharmacy. 

## 2011-12-11 ENCOUNTER — Other Ambulatory Visit (HOSPITAL_BASED_OUTPATIENT_CLINIC_OR_DEPARTMENT_OTHER): Payer: BC Managed Care – PPO | Admitting: Lab

## 2011-12-11 ENCOUNTER — Ambulatory Visit (HOSPITAL_BASED_OUTPATIENT_CLINIC_OR_DEPARTMENT_OTHER): Payer: BC Managed Care – PPO | Admitting: Hematology & Oncology

## 2011-12-11 VITALS — BP 126/75 | HR 57 | Temp 96.8°F | Ht 63.0 in | Wt 128.0 lb

## 2011-12-11 DIAGNOSIS — D509 Iron deficiency anemia, unspecified: Secondary | ICD-10-CM

## 2011-12-11 DIAGNOSIS — F329 Major depressive disorder, single episode, unspecified: Secondary | ICD-10-CM

## 2011-12-11 DIAGNOSIS — E039 Hypothyroidism, unspecified: Secondary | ICD-10-CM

## 2011-12-11 DIAGNOSIS — D473 Essential (hemorrhagic) thrombocythemia: Secondary | ICD-10-CM

## 2011-12-11 DIAGNOSIS — R634 Abnormal weight loss: Secondary | ICD-10-CM

## 2011-12-11 LAB — CBC WITH DIFFERENTIAL (CANCER CENTER ONLY)
BASO#: 0.1 10*3/uL (ref 0.0–0.2)
BASO%: 1.1 % (ref 0.0–2.0)
EOS%: 2.3 % (ref 0.0–7.0)
Eosinophils Absolute: 0.1 10*3/uL (ref 0.0–0.5)
HCT: 35.4 % (ref 34.8–46.6)
HGB: 12.7 g/dL (ref 11.6–15.9)
LYMPH#: 1.2 10*3/uL (ref 0.9–3.3)
LYMPH%: 27.6 % (ref 14.0–48.0)
MCH: 38.8 pg — ABNORMAL HIGH (ref 26.0–34.0)
MCHC: 35.9 g/dL (ref 32.0–36.0)
MCV: 108 fL — ABNORMAL HIGH (ref 81–101)
MONO#: 0.3 10*3/uL (ref 0.1–0.9)
MONO%: 6.4 % (ref 0.0–13.0)
NEUT#: 2.7 10*3/uL (ref 1.5–6.5)
NEUT%: 62.6 % (ref 39.6–80.0)
Platelets: 549 10*3/uL — ABNORMAL HIGH (ref 145–400)
RBC: 3.27 10*6/uL — ABNORMAL LOW (ref 3.70–5.32)
RDW: 13.3 % (ref 11.1–15.7)
WBC: 4.4 10*3/uL (ref 3.9–10.0)

## 2011-12-11 LAB — IRON AND TIBC
%SAT: 47 % (ref 20–55)
Iron: 118 ug/dL (ref 42–145)
TIBC: 250 ug/dL (ref 250–470)
UIBC: 132 ug/dL (ref 125–400)

## 2011-12-11 LAB — LACTATE DEHYDROGENASE: LDH: 158 U/L (ref 94–250)

## 2011-12-11 LAB — FERRITIN: Ferritin: 175 ng/mL (ref 10–291)

## 2011-12-11 LAB — CHCC SATELLITE - SMEAR

## 2011-12-11 NOTE — Progress Notes (Signed)
This office note has been dictated.

## 2011-12-12 NOTE — Progress Notes (Signed)
CC:   Rachael Arbour, MD  DIAGNOSES: 1. Essential thrombocythemia-JAK2 negative. 2. Intermittent iron deficiency anemia.  CURRENT THERAPY: 1. Hydrea 500 mg p.o. daily. 2. IV iron as indicated.  INTERIM HISTORY:  Rachael Johnson comes in for followup.  Unfortunately she is still losing weight.  She is having a really hard time getting over her husband's tragic death.  It has been a year in 2023/02/18 that he passed away.  Their anniversary was in April.  She had a horrible time that day she said.  Yesterday, for Mother's Day, she had a really hard time.  She went to work that day to try to keep busy. Thankfully, her family is very nice and very supportive.  They are trying to get her through this horrible event.  When we last saw her, in March, her ferritin was 178.  Iron saturation was 35%.  She has had no bleeding.  There has been no headache.  She has had no rashes.  There has been no burning in her hands or feet.  There has been no nausea or vomiting.  She just does not have much of an appetite.  She does have hypothyroidism.  She is on Synthroid 0.088 mg.  PHYSICAL EXAM:  This is a thin white female in no obvious distress. Vital signs:  96.8, pulse 57, respiratory rate 20, blood pressure 126/75, weight is 128.  Head/Neck:  Normocephalic, atraumatic skull. There are no ocular or oral lesions.  There are no palpable cervical or supraclavicular lymph nodes.  Lungs:  Clear to percussion and auscultation bilaterally.  Cardiac:  Regular rate and rhythm with a normal S1, S2.  There are no murmurs, rubs or bruits.  Abdomen:  Soft with good bowel sounds.  There is no palpable abdominal mass.  There is no fluid wave.  There is no palpable hepatosplenomegaly.  Extremities: No clubbing, cyanosis or edema.  Neurological:  No focal neurological deficits.  LABORATORY STUDIES:  White cell count is 4.4, hemoglobin 12.7, hematocrit 35.4, platelet count is 549.  MCV is 108.  IMPRESSION:  Rachael Johnson is a  61 year old white female with essential thrombocythemia.  She is holding her own with respect to this. Unfortunately, the weight loss still troubles me quite a bit.  I just feel bad for her.  She is having a hard time getting through her husband's death.  She really is worried about next month when it will be the year anniversary.  Again she has a great family who will certainly do their best to help her.  I want to see her back in another 2-3 months for followup.  I do not see that we need to make a change with her Hydrea dose.    ______________________________ Josph Macho, M.D. PRE/MEDQ  D:  12/11/2011  T:  12/12/2011  Job:  [id number]

## 2011-12-29 ENCOUNTER — Ambulatory Visit (INDEPENDENT_AMBULATORY_CARE_PROVIDER_SITE_OTHER): Payer: BC Managed Care – PPO | Admitting: Internal Medicine

## 2011-12-29 ENCOUNTER — Encounter: Payer: Self-pay | Admitting: Internal Medicine

## 2011-12-29 VITALS — BP 112/68 | HR 62 | Temp 98.3°F | Resp 16 | Wt 128.0 lb

## 2011-12-29 DIAGNOSIS — R634 Abnormal weight loss: Secondary | ICD-10-CM

## 2011-12-29 DIAGNOSIS — R031 Nonspecific low blood-pressure reading: Secondary | ICD-10-CM

## 2011-12-29 DIAGNOSIS — F419 Anxiety disorder, unspecified: Secondary | ICD-10-CM

## 2011-12-29 DIAGNOSIS — E039 Hypothyroidism, unspecified: Secondary | ICD-10-CM

## 2011-12-29 DIAGNOSIS — F411 Generalized anxiety disorder: Secondary | ICD-10-CM

## 2011-12-29 NOTE — Assessment & Plan Note (Signed)
Related to anxiety per pt but advise close monitoring and labs prior to next visit

## 2011-12-29 NOTE — Progress Notes (Signed)
  Subjective:    Patient ID: Rachael Johnson, female    DOB: [birth year], 61 y.o.   MRN: [id number]  HPI Pt presents to clinic for followup of multiple medical problems. Last visit hctz dc'ed due to mildly low bp. bp reviewed normotensive. Has noted weight loss and pt attributes to stress/anxiety. celexa helps sx's but still primarily affected by her husband's passing. S/p hysterectomy for "cancerous cells" and utd with gyn with f/u appt pending. Mammogram and colonoscopy utd. No other complaints.  Past Medical History  Diagnosis Date  . Arthritis   . Allergy   . Thyroid disease   . Diverticulitis   . Frequent episodic tension-type headache   . Hypertension   . Seizure     childhood  . Essential thrombocythemia 01/24/2011   Past Surgical History  Procedure Date  . Abdominal hysterectomy     1992  . Breast surgery 1992    biopsy, benign. Fibrocystic    reports that she quit smoking about 21 years ago. Her smoking use included Cigarettes. She has a 17.5 pack-year smoking history. She has never used smokeless tobacco. She reports that she does not drink alcohol or use illicit drugs. family history includes Arthritis in her father and mother; Cancer in her brothers, father, mother, and sister; Heart disease in her mother; and Hypertension in her father and mother. No Known Allergies    Review of Systems see hpi     Objective:   Physical Exam  Physical Exam  Nursing note and vitals reviewed. Constitutional: Appears well-developed and well-nourished. No distress.  HENT:  Head: Normocephalic and atraumatic.  Right Ear: External ear normal.  Left Ear: External ear normal.  Eyes: Conjunctivae are normal. No scleral icterus.  Neck: Neck supple. Carotid bruit is not present.  Cardiovascular: Normal rate, regular rhythm and normal heart sounds.  Exam reveals no gallop and no friction rub.   No murmur heard. Pulmonary/Chest: Effort normal and breath sounds normal. No respiratory  distress. He has no wheezes. no rales.  Lymphadenopathy:    He has no cervical adenopathy.  Neurological:Alert.  Skin: Skin is warm and dry. Not diaphoretic.  Psychiatric: Has a normal mood and affect.       Assessment & Plan:

## 2011-12-29 NOTE — Assessment & Plan Note (Signed)
Resolved s/p dc of hctz. Continue to monitor.

## 2011-12-29 NOTE — Assessment & Plan Note (Signed)
Improved with celexa but may be contributing to wt loss per pt.

## 2011-12-29 NOTE — Patient Instructions (Signed)
Please schedule tsh/free t4-hypothyroidism, chem7, esr, lft-weight loss prior to next visit

## 2011-12-29 NOTE — Assessment & Plan Note (Signed)
Stable. Obtain tsh and free t4 prior to next visit 

## 2012-01-29 ENCOUNTER — Telehealth: Payer: Self-pay | Admitting: Internal Medicine

## 2012-01-29 MED ORDER — OMEPRAZOLE 40 MG PO CPDR: 40.0000 mg | DELAYED_RELEASE_CAPSULE | Freq: Every day | ORAL | Status: DC

## 2012-01-29 NOTE — Telephone Encounter (Signed)
Refill- omeprazole 40mg  cap. Take one capsule(40mg  total) by mouth daily. Qty 90 last fill 3.30.13

## 2012-01-29 NOTE — Telephone Encounter (Signed)
Done/SLS 

## 2012-02-09 ENCOUNTER — Telehealth: Payer: Self-pay | Admitting: *Deleted

## 2012-02-09 ENCOUNTER — Encounter: Payer: Self-pay | Admitting: Gynecology

## 2012-02-09 ENCOUNTER — Other Ambulatory Visit (HOSPITAL_BASED_OUTPATIENT_CLINIC_OR_DEPARTMENT_OTHER): Payer: BC Managed Care – PPO | Admitting: Lab

## 2012-02-09 ENCOUNTER — Ambulatory Visit (INDEPENDENT_AMBULATORY_CARE_PROVIDER_SITE_OTHER): Payer: BC Managed Care – PPO | Admitting: Gynecology

## 2012-02-09 ENCOUNTER — Ambulatory Visit (HOSPITAL_BASED_OUTPATIENT_CLINIC_OR_DEPARTMENT_OTHER): Payer: BC Managed Care – PPO | Admitting: Hematology & Oncology

## 2012-02-09 VITALS — BP 128/64 | Ht 63.5 in | Wt 130.0 lb

## 2012-02-09 VITALS — BP 115/69 | HR 68 | Temp 97.3°F | Wt 129.0 lb

## 2012-02-09 DIAGNOSIS — E039 Hypothyroidism, unspecified: Secondary | ICD-10-CM

## 2012-02-09 DIAGNOSIS — R634 Abnormal weight loss: Secondary | ICD-10-CM

## 2012-02-09 DIAGNOSIS — D473 Essential (hemorrhagic) thrombocythemia: Secondary | ICD-10-CM

## 2012-02-09 DIAGNOSIS — Z79890 Hormone replacement therapy (postmenopausal): Secondary | ICD-10-CM

## 2012-02-09 DIAGNOSIS — Z780 Asymptomatic menopausal state: Secondary | ICD-10-CM

## 2012-02-09 DIAGNOSIS — D509 Iron deficiency anemia, unspecified: Secondary | ICD-10-CM

## 2012-02-09 DIAGNOSIS — Z01419 Encounter for gynecological examination (general) (routine) without abnormal findings: Secondary | ICD-10-CM

## 2012-02-09 LAB — CBC WITH DIFFERENTIAL (CANCER CENTER ONLY)
BASO#: 0 10*3/uL (ref 0.0–0.2)
BASO%: 0.6 % (ref 0.0–2.0)
EOS%: 2.1 % (ref 0.0–7.0)
Eosinophils Absolute: 0.1 10*3/uL (ref 0.0–0.5)
HCT: 35 % (ref 34.8–46.6)
HGB: 12 g/dL (ref 11.6–15.9)
LYMPH#: 1.6 10*3/uL (ref 0.9–3.3)
LYMPH%: 32.6 % (ref 14.0–48.0)
MCH: 38.8 pg — ABNORMAL HIGH (ref 26.0–34.0)
MCHC: 34.3 g/dL (ref 32.0–36.0)
MCV: 113 fL — ABNORMAL HIGH (ref 81–101)
MONO#: 0.3 10*3/uL (ref 0.1–0.9)
MONO%: 6 % (ref 0.0–13.0)
NEUT#: 2.8 10*3/uL (ref 1.5–6.5)
NEUT%: 58.7 % (ref 39.6–80.0)
Platelets: 455 10*3/uL — ABNORMAL HIGH (ref 145–400)
RBC: 3.09 10*6/uL — ABNORMAL LOW (ref 3.70–5.32)
RDW: 13.9 % (ref 11.1–15.7)
WBC: 4.8 10*3/uL (ref 3.9–10.0)

## 2012-02-09 LAB — HEPATIC FUNCTION PANEL
ALT: 12 U/L (ref 0–35)
AST: 20 U/L (ref 0–37)
Albumin: 4 g/dL (ref 3.5–5.2)
Alkaline Phosphatase: 45 U/L (ref 39–117)
Bilirubin, Direct: 0.1 mg/dL (ref 0.0–0.3)
Indirect Bilirubin: 0.3 mg/dL (ref 0.0–0.9)
Total Bilirubin: 0.4 mg/dL (ref 0.3–1.2)
Total Protein: 6.3 g/dL (ref 6.0–8.3)

## 2012-02-09 LAB — BASIC METABOLIC PANEL
BUN: 18 mg/dL (ref 6–23)
CO2: 28 mEq/L (ref 19–32)
Calcium: 8.8 mg/dL (ref 8.4–10.5)
Chloride: 104 mEq/L (ref 96–112)
Creat: 0.59 mg/dL (ref 0.50–1.10)
Glucose, Bld: 80 mg/dL (ref 70–99)
Potassium: 4.6 mEq/L (ref 3.5–5.3)
Sodium: 138 mEq/L (ref 135–145)

## 2012-02-09 LAB — IRON AND TIBC
%SAT: 36 % (ref 20–55)
Iron: 88 ug/dL (ref 42–145)
TIBC: 243 ug/dL — ABNORMAL LOW (ref 250–470)
UIBC: 155 ug/dL (ref 125–400)

## 2012-02-09 LAB — FERRITIN: Ferritin: 188 ng/mL (ref 10–291)

## 2012-02-09 LAB — TSH: TSH: 3.048 u[IU]/mL (ref 0.350–4.500)

## 2012-02-09 LAB — CHCC SATELLITE - SMEAR

## 2012-02-09 LAB — T4, FREE: Free T4: 0.97 ng/dL (ref 0.80–1.80)

## 2012-02-09 MED ORDER — ESTRADIOL 0.5 MG PO TABS: 0.5000 mg | ORAL_TABLET | Freq: Every day | ORAL | Status: DC

## 2012-02-09 NOTE — Progress Notes (Signed)
This office note has been dictated.

## 2012-02-09 NOTE — Patient Instructions (Signed)
Follow up for bone density is scheduled. Otherwise follow up in one year for annual gynecologic exam.

## 2012-02-09 NOTE — Progress Notes (Signed)
Rachael Johnson 1951/01/27 [id number]        61 y.o.  [id number] new patient for annual exam.  Former patient of Dr. August Saucer McPhail's. Several issues noted below.  Past medical history,surgical history, medications, allergies, family history and social history were all reviewed and documented in the EPIC chart. ROS:  Was performed and pertinent positives and negatives are included in the history.  Exam: Sherrilyn Rist assistant Filed Vitals:   02/09/12 1137  BP: 128/64  Height: 5' 3.5" (1.613 m)  Weight: 130 lb (58.968 kg)   General appearance  Normal Skin grossly normal Head/Neck normal with no cervical or supraclavicular adenopathy thyroid normal Lungs  clear Cardiac RR, without RMG Abdominal  soft, nontender, without masses, organomegaly or hernia Breasts  examined lying and sitting without masses, retractions, discharge or axillary adenopathy. Pelvic  Ext/BUS/vagina  normal with mild atrophic changes   Adnexa  Without masses or tenderness    Anus and perineum  normal   Rectovaginal  normal sphincter tone without palpated masses or tenderness.    Assessment/Plan:  61 y.o. [id number] new patient for annual exam.   1. ERT. Patient is on estradiol 0.5 mg twice a day and has been on this for a number of years. She is status post LAVH 1992 for pain and bleeding. Was initially started on ERT due to menopausal symptoms. I reviewed the WHI study, increased risk of stroke, heart attack, DVT possible increased risk of breast cancer. The ACOG and NAMS statements for lowest dose for shortness period of time. Recommended she go to 0.5 mg daily see how she does and ultimately weaned herself off this coming winter. Patient agrees with this plan. I did refill her 0.5 mg one daily times a year in the event that she cannot wean herself off due to symptoms and she wants to continue accepting the above risks. 2. History dysplasia. 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 Historically it  sounds that she had cryosurgery treatment. Her hysterectomy in 1992 showed no residual dysplasia or other cervical abnormalities.  Last Pap smear of vaginal cuff 2012. I did not do a Pap smear today. The issues of stop screening altogether as she is status post hysterectomy and over 20 years out from her high-grade dysplasia versus less frequent screening was reviewed and we'll readdress this on an annual basis. 3. Colonoscopy. Patient had colonoscopy last year. We'll continue further recommended guidelines. 4. Bone density. Patient's never had a baseline bone density. She does have a history of smoking for years. When check baseline DEXA now. 5. Health maintenance. No blood work was done today and she is actively followed by her primary physician and her oncologist who follows her for her thrombocytosis.    Dara Lords MD, 12:53 PM 02/09/2012

## 2012-02-09 NOTE — Progress Notes (Signed)
CC:   Marguarite Arbour, MD Timothy P. Fontaine, M.D.  DIAGNOSES: 1. Essential thrombocythemia, JAK2-negative. 2. Intermittent iron-deficiency anemia.  CURRENT THERAPY: 1. Hydrea 500 mg p.o. daily. 2. IV iron as indicated.  INTERIM HISTORY:  Rachael Johnson comes in for followup.  She is doing a little better than when we last saw her.  She now has a new dog.  This is helping her quite a bit.  I am just thankful that she is having a little bit of fun now.  She has had no problems with bleeding or bruising.  She is still working.  Her appetite is okay.  She has had no nausea or vomiting.  She has had no rashes.  When we last saw her in May, her ferritin was 175.  Iron saturation was 47%.  PHYSICAL EXAMINATION:  This is a well-developed, well-nourished white female in no obvious distress.  Vital signs:  Temperature of 97.3, pulse 68, respiratory rate 16, blood pressure 115/69.  Weight is 129.  Head and neck:  Normocephalic, atraumatic skull.  There are no ocular or oral lesions.  There are no palpable cervical or supraclavicular lymph nodes. Lungs:  Clear to percussion and auscultation bilaterally.  Cardiac: Regular rate and rhythm with normal S1, S2.  There are no murmurs or bruits.  Abdomen:  Soft with good bowel sounds.  There is no palpable abdominal mass.  There is no fluid wave.  There is no palpable hepatosplenomegaly.  Extremities:  No clubbing, cyanosis or edema. Skin:  No rashes, ecchymosis or petechia.  LABORATORY STUDIES:  White cell count 4.8, hemoglobin 12, hematocrit 35, platelet count 455.  MCV is 113.  IMPRESSION:  Ms. Croghan is a 61 year old white female with essential thrombocythemia.  She is doing well with this.  Her platelet count is coming down nicely.  We will not change her dose of Hydrea for right now.  We will plan to see her back in 2 more months.   ______________________________ Josph Macho, M.D. PRE/MEDQ  D:  02/09/2012  T:  02/09/2012  Job:   [id number]

## 2012-02-10 LAB — SEDIMENTATION RATE: Sed Rate: 4 mm/hr (ref 0–22)

## 2012-02-14 ENCOUNTER — Telehealth: Payer: Self-pay | Admitting: Internal Medicine

## 2012-02-14 MED ORDER — ACETAMINOPHEN-CODEINE #3 300-30 MG PO TABS: 1.0000 | ORAL_TABLET | Freq: Three times a day (TID) | ORAL | Status: DC | PRN

## 2012-02-14 NOTE — Telephone Encounter (Signed)
Done/SLS 

## 2012-02-14 NOTE — Telephone Encounter (Signed)
Refill- tylenol/cod #3 tab. Last fill 6.19.13

## 2012-02-14 NOTE — Telephone Encounter (Signed)
#  60 rf3

## 2012-02-16 NOTE — Telephone Encounter (Signed)
error 

## 2012-02-23 ENCOUNTER — Encounter: Payer: Self-pay | Admitting: Internal Medicine

## 2012-02-23 ENCOUNTER — Ambulatory Visit (INDEPENDENT_AMBULATORY_CARE_PROVIDER_SITE_OTHER): Payer: BC Managed Care – PPO | Admitting: Internal Medicine

## 2012-02-23 VITALS — BP 110/72 | HR 59 | Temp 98.4°F | Resp 14 | Wt 127.0 lb

## 2012-02-23 DIAGNOSIS — R21 Rash and other nonspecific skin eruption: Secondary | ICD-10-CM | POA: Insufficient documentation

## 2012-02-23 DIAGNOSIS — F329 Major depressive disorder, single episode, unspecified: Secondary | ICD-10-CM

## 2012-02-23 DIAGNOSIS — F32A Depression, unspecified: Secondary | ICD-10-CM

## 2012-02-23 MED ORDER — TRIAMCINOLONE ACETONIDE 0.1 % EX CREA: TOPICAL_CREAM | Freq: Two times a day (BID) | CUTANEOUS | Status: DC | PRN

## 2012-02-23 NOTE — Progress Notes (Signed)
  Subjective:    Patient ID: Rachael Johnson, female    DOB: [birth year], 61 y.o.   MRN: [id number]  HPI Pt presents to clinic for followup of multiple medical problems. Feels mood is improved with celexa-helps coping with her loss of spouse. Tolerates without side effect. Notes intermittent bilateral lower leg erythematous itchy rash. Seems to occur at work. Using otc cream without improvement. No other alleviating or exacerbating factors. Weight has stabilized with improved mood.   Past Medical History  Diagnosis Date  . Arthritis   . Allergy   . Thyroid disease   . Diverticulitis   . Frequent episodic tension-type headache   . Hypertension   . Seizure     childhood  . Essential thrombocythemia 01/24/2011   Past Surgical History  Procedure Date  . Breast surgery 1992    biopsy, benign. Fibrocystic  . Abdominal hysterectomy     1992    reports that she quit smoking about 21 years ago. Her smoking use included Cigarettes. She has a 17.5 pack-year smoking history. She has never used smokeless tobacco. She reports that she does not drink alcohol or use illicit drugs. family history includes Arthritis in her father and mother; Cancer in her brothers, father, mother, and sister; Heart disease in her mother; and Hypertension in her father and mother. No Known Allergies    Review of Systems see hpi     Objective:   Physical Exam  Physical Exam  Nursing note and vitals reviewed. Constitutional: Appears well-developed and well-nourished. No distress.  HENT:  Head: Normocephalic and atraumatic.  Right Ear: External ear normal.  Left Ear: External ear normal.  Eyes: Conjunctivae are normal. No scleral icterus.  Neck: Neck supple. Carotid bruit is not present.  Cardiovascular: Normal rate, regular rhythm and normal heart sounds.  Exam reveals no gallop and no friction rub.   No murmur heard. Pulmonary/Chest: Effort normal and breath sounds normal. No respiratory distress. He has no  wheezes. no rales.  Lymphadenopathy:    He has no cervical adenopathy.  Neurological:Alert.  Skin: Skin is warm and dry. Not diaphoretic.  Psychiatric: Has a normal mood and affect.        Assessment & Plan:

## 2012-02-23 NOTE — Patient Instructions (Signed)
Please schedule labs prior to next visit Tsh/free t4-hypothyroidism and chem7-[id number]

## 2012-02-23 NOTE — Assessment & Plan Note (Signed)
Improved. Continue current celexa dose

## 2012-02-23 NOTE — Assessment & Plan Note (Signed)
Attempt triamcinolone. Followup if no improvement or worsening. 

## 2012-03-03 ENCOUNTER — Other Ambulatory Visit: Payer: Self-pay | Admitting: Internal Medicine

## 2012-03-04 NOTE — Telephone Encounter (Signed)
rf 3 

## 2012-03-04 NOTE — Telephone Encounter (Signed)
Please advise re: additional refills.   Medication name:  Name from pharmacy:  naproxen (NAPROSYN) 500 MG tablet  NAPROXEN 500 MG TAB AMNE Sig: TAKE ONE TABLET BY MOUTH TWICE A DAY WITH MEALS Dispense: 60 tablet Refills: 1 Start: 03/03/2012 Class: Normal Requested on: 11/21/2011 Originally ordered on: 01/20/2011 Last refill: 02/05/2012

## 2012-03-05 NOTE — Telephone Encounter (Signed)
Refill sent to [facility] 

## 2012-03-13 ENCOUNTER — Ambulatory Visit (INDEPENDENT_AMBULATORY_CARE_PROVIDER_SITE_OTHER): Payer: BC Managed Care – PPO

## 2012-03-13 DIAGNOSIS — Z780 Asymptomatic menopausal state: Secondary | ICD-10-CM

## 2012-03-13 DIAGNOSIS — Z13820 Encounter for screening for osteoporosis: Secondary | ICD-10-CM

## 2012-03-31 LAB — HM MAMMOGRAPHY: HM Mammogram: NORMAL

## 2012-04-10 ENCOUNTER — Ambulatory Visit (HOSPITAL_BASED_OUTPATIENT_CLINIC_OR_DEPARTMENT_OTHER): Payer: BC Managed Care – PPO | Admitting: Hematology & Oncology

## 2012-04-10 ENCOUNTER — Other Ambulatory Visit (HOSPITAL_BASED_OUTPATIENT_CLINIC_OR_DEPARTMENT_OTHER): Payer: BC Managed Care – PPO | Admitting: Lab

## 2012-04-10 VITALS — BP 114/72 | HR 59 | Temp 97.9°F | Resp 18 | Ht 63.0 in | Wt 124.0 lb

## 2012-04-10 DIAGNOSIS — D509 Iron deficiency anemia, unspecified: Secondary | ICD-10-CM

## 2012-04-10 DIAGNOSIS — D473 Essential (hemorrhagic) thrombocythemia: Secondary | ICD-10-CM

## 2012-04-10 LAB — CBC WITH DIFFERENTIAL (CANCER CENTER ONLY)
BASO#: 0 10*3/uL (ref 0.0–0.2)
BASO%: 1.1 % (ref 0.0–2.0)
EOS%: 4.4 % (ref 0.0–7.0)
Eosinophils Absolute: 0.2 10*3/uL (ref 0.0–0.5)
HCT: 35.7 % (ref 34.8–46.6)
HGB: 12 g/dL (ref 11.6–15.9)
LYMPH#: 1.4 10*3/uL (ref 0.9–3.3)
LYMPH%: 39.3 % (ref 14.0–48.0)
MCH: 38.7 pg — ABNORMAL HIGH (ref 26.0–34.0)
MCHC: 33.6 g/dL (ref 32.0–36.0)
MCV: 115 fL — ABNORMAL HIGH (ref 81–101)
MONO#: 0.3 10*3/uL (ref 0.1–0.9)
MONO%: 7.1 % (ref 0.0–13.0)
NEUT#: 1.8 10*3/uL (ref 1.5–6.5)
NEUT%: 48.1 % (ref 39.6–80.0)
Platelets: 490 10*3/uL — ABNORMAL HIGH (ref 145–400)
RBC: 3.1 10*6/uL — ABNORMAL LOW (ref 3.70–5.32)
RDW: 12.5 % (ref 11.1–15.7)
WBC: 3.7 10*3/uL — ABNORMAL LOW (ref 3.9–10.0)

## 2012-04-10 LAB — IRON AND TIBC
%SAT: 31 % (ref 20–55)
Iron: 77 ug/dL (ref 42–145)
TIBC: 252 ug/dL (ref 250–470)
UIBC: 175 ug/dL (ref 125–400)

## 2012-04-10 LAB — FERRITIN: Ferritin: 146 ng/mL (ref 10–291)

## 2012-04-10 NOTE — Progress Notes (Signed)
This office note has been dictated.

## 2012-04-10 NOTE — Patient Instructions (Signed)
Call with any problems

## 2012-04-11 ENCOUNTER — Telehealth: Payer: Self-pay | Admitting: Internal Medicine

## 2012-04-11 NOTE — Telephone Encounter (Signed)
Refill- xanax 0.25mg  tab. Last fill 7.8.13

## 2012-04-11 NOTE — Telephone Encounter (Signed)
#  90 rf3 

## 2012-04-11 NOTE — Progress Notes (Signed)
CC:   Rachael Arbour, MD Timothy P. Fontaine, M.D.  DIAGNOSES: 1. Essential thrombocythemia, JAK2-negative. 2. Intermittent iron deficiency anemia.  CURRENT THERAPY: 1. Hydrea 500 mg p.o. daily. 2. IV iron as indicated.  INTERIM HISTORY:  Rachael Johnson comes in for her followup.  She is doing a little bit better.  She is enjoying her new dog.  Her dog's name is Zoey.  She brought in some pictures of her little dog.  She is working, which is helping her quite a bit.  He still misses her husband, who passed away tragically I think a couple of years ago.  Her iron studies have been doing okay.  When we last saw her, her ferritin was 188 with an iron saturation of 36%.  PHYSICAL EXAMINATION:  General:  This is a thin white female in no obvious distress.  Vital signs:  Temperature of 97.9, pulse 59, respiratory rate 18, blood pressure 114/72.  Weight is 124.  Head and neck:  Normocephalic, atraumatic skull.  There are no ocular or oral lesions.  There are no palpable cervical or supraclavicular lymph nodes. Lungs:  Clear bilaterally.  Cardiac:  Regular rate and rhythm with a normal S1, S2.  There are no murmurs, rubs or bruits.  Abdomen:  Soft with good bowel sounds.  There is no palpable abdominal mass.  There is no fluid wave.  There is no palpable hepatosplenomegaly.  Back:  No tenderness over the spine, ribs, or hips.  LABORATORY STUDIES:  White cell count is 3.7, hemoglobin 12, hematocrit 35.6, platelet count 490.  MCV is 115.  IMPRESSION:  Rachael Johnson is a 61 year old white female with essential thrombocythemia.  Her platelet count is really holding quite steady.  I am not going to change her Hydrea dose.  We will plan to get her back in another 3 months.  I think we can get a 72-month intervals now.  I do not see a need for blood work in between visits.   ______________________________ Josph Macho, M.D. PRE/MEDQ  D:  04/10/2012  T:  04/11/2012  Job:  [id number]

## 2012-04-12 MED ORDER — ALPRAZOLAM 0.25 MG PO TABS: 0.2500 mg | ORAL_TABLET | Freq: Three times a day (TID) | ORAL | Status: DC | PRN

## 2012-04-15 ENCOUNTER — Telehealth: Payer: Self-pay | Admitting: *Deleted

## 2012-04-15 NOTE — Telephone Encounter (Signed)
Called patient and left message on personal phone to let her know that her iron levels were good per dr. [name]

## 2012-04-15 NOTE — Telephone Encounter (Signed)
Message copied by Anselm Jungling on Mon Apr 15, 2012  9:36 AM ------      Message from: Arlan Organ R      Created: Thu Apr 11, 2012  8:47 PM       Call - iron is ok.  pete

## 2012-04-16 ENCOUNTER — Telehealth: Payer: Self-pay | Admitting: Internal Medicine

## 2012-05-27 ENCOUNTER — Telehealth: Payer: Self-pay | Admitting: Internal Medicine

## 2012-05-27 NOTE — Telephone Encounter (Signed)
9/13 prescription had 3 rf

## 2012-05-27 NOTE — Telephone Encounter (Signed)
Last Rx phoned in to pharmacy 09.13.13 #[id number]; phoned pharmacy to inquire on status, informed that pt does have active refills on hold, pharmacy will notify patient/SLS

## 2012-05-27 NOTE — Telephone Encounter (Signed)
Refill-alprazolam 0.25mg  tab. Last fill 9.13.13

## 2012-05-28 ENCOUNTER — Telehealth: Payer: Self-pay | Admitting: Internal Medicine

## 2012-05-28 MED ORDER — FUROSEMIDE 20 MG PO TABS: 20.0000 mg | ORAL_TABLET | ORAL | Status: DC

## 2012-05-28 NOTE — Telephone Encounter (Signed)
Rx to pharmacy/SLS 

## 2012-05-28 NOTE — Telephone Encounter (Signed)
Refill-furosemide 20mg  tab. Take one tablet by mouth every morning. Qty 30 last fill: NA

## 2012-06-04 ENCOUNTER — Other Ambulatory Visit: Payer: Self-pay | Admitting: Internal Medicine

## 2012-06-04 NOTE — Telephone Encounter (Signed)
Ok rf3 

## 2012-06-04 NOTE — Telephone Encounter (Signed)
Pt has f/u in January. Last refill of naproxen 500mg  was 03/03/12, #60 x 2 refills. Is it ok to provide refills as before?

## 2012-06-05 NOTE — Telephone Encounter (Signed)
Refill sent.

## 2012-07-03 ENCOUNTER — Telehealth: Payer: Self-pay | Admitting: Internal Medicine

## 2012-07-03 MED ORDER — ACETAMINOPHEN-CODEINE #3 300-30 MG PO TABS: 1.0000 | ORAL_TABLET | Freq: Three times a day (TID) | ORAL | Status: DC | PRN

## 2012-07-03 NOTE — Telephone Encounter (Signed)
Refill- apap/codeine 300-30mg  tab. Last fill 10.23.13

## 2012-07-03 NOTE — Telephone Encounter (Signed)
Ok same 

## 2012-07-03 NOTE — Telephone Encounter (Signed)
Rx phoned to pharmacy/SLS 

## 2012-07-07 ENCOUNTER — Other Ambulatory Visit: Payer: Self-pay | Admitting: Internal Medicine

## 2012-07-08 ENCOUNTER — Ambulatory Visit (HOSPITAL_BASED_OUTPATIENT_CLINIC_OR_DEPARTMENT_OTHER): Payer: BC Managed Care – PPO | Admitting: Hematology & Oncology

## 2012-07-08 ENCOUNTER — Encounter: Payer: Self-pay | Admitting: Hematology & Oncology

## 2012-07-08 ENCOUNTER — Other Ambulatory Visit (HOSPITAL_BASED_OUTPATIENT_CLINIC_OR_DEPARTMENT_OTHER): Payer: BC Managed Care – PPO | Admitting: Lab

## 2012-07-08 ENCOUNTER — Other Ambulatory Visit: Payer: Self-pay | Admitting: *Deleted

## 2012-07-08 VITALS — BP 116/70 | HR 57 | Temp 97.6°F | Resp 16 | Ht 63.0 in | Wt 123.0 lb

## 2012-07-08 DIAGNOSIS — D539 Nutritional anemia, unspecified: Secondary | ICD-10-CM

## 2012-07-08 DIAGNOSIS — D473 Essential (hemorrhagic) thrombocythemia: Secondary | ICD-10-CM

## 2012-07-08 DIAGNOSIS — D509 Iron deficiency anemia, unspecified: Secondary | ICD-10-CM

## 2012-07-08 HISTORY — DX: Iron deficiency anemia, unspecified: D50.9

## 2012-07-08 LAB — IRON AND TIBC
%SAT: 32 % (ref 20–55)
Iron: 76 ug/dL (ref 42–145)
TIBC: 240 ug/dL — ABNORMAL LOW (ref 250–470)
UIBC: 164 ug/dL (ref 125–400)

## 2012-07-08 LAB — CBC WITH DIFFERENTIAL (CANCER CENTER ONLY)
BASO#: 0 10*3/uL (ref 0.0–0.2)
BASO%: 0.8 % (ref 0.0–2.0)
EOS%: 3.1 % (ref 0.0–7.0)
Eosinophils Absolute: 0.1 10*3/uL (ref 0.0–0.5)
HCT: 33.9 % — ABNORMAL LOW (ref 34.8–46.6)
HGB: 11.4 g/dL — ABNORMAL LOW (ref 11.6–15.9)
LYMPH#: 1.6 10*3/uL (ref 0.9–3.3)
LYMPH%: 43.5 % (ref 14.0–48.0)
MCH: 38.1 pg — ABNORMAL HIGH (ref 26.0–34.0)
MCHC: 33.6 g/dL (ref 32.0–36.0)
MCV: 113 fL — ABNORMAL HIGH (ref 81–101)
MONO#: 0.2 10*3/uL (ref 0.1–0.9)
MONO%: 6.4 % (ref 0.0–13.0)
NEUT#: 1.7 10*3/uL (ref 1.5–6.5)
NEUT%: 46.2 % (ref 39.6–80.0)
Platelets: 518 10*3/uL — ABNORMAL HIGH (ref 145–400)
RBC: 2.99 10*6/uL — ABNORMAL LOW (ref 3.70–5.32)
RDW: 12.8 % (ref 11.1–15.7)
WBC: 3.6 10*3/uL — ABNORMAL LOW (ref 3.9–10.0)

## 2012-07-08 LAB — FERRITIN: Ferritin: 143 ng/mL (ref 10–291)

## 2012-07-08 LAB — CHCC SATELLITE - SMEAR

## 2012-07-08 MED ORDER — LEVOTHYROXINE SODIUM 88 MCG PO TABS: 88.0000 ug | ORAL_TABLET | Freq: Every day | ORAL | Status: DC

## 2012-07-08 NOTE — Progress Notes (Signed)
This office note has been dictated.

## 2012-07-09 NOTE — Progress Notes (Signed)
CC:   Marguarite Arbour, MD Timothy P. Fontaine, M.D.  DIAGNOSES: 1. Essential thrombocythemia, JAK2 negative. 2. Intermittent iron deficiency anemia.  CURRENT THERAPY: 1. IV iron as indicated. 2. Hydrea 500 mg p.o. daily.  INTERIM HISTORY:  [name] comes in for followup.  She is doing okay. She is still really troubled by her husband's death a year ago.  She just is having a hard time getting through this.  I really feel bad for her.  She does have a new dog.  She really has been a blessing to [name]. [name] spends a lot of time with her dog and this provides some comfort.  [name] is still working, although she does have a tough time with this.  When we last saw her, her iron studies were borderline, in my opinion. Her ferritin was 146 with an iron saturation of 31%.  Iron was 77.  She has had no bleeding.  There have been no rashes.  PHYSICAL EXAMINATION:  General:  This is a somewhat petite white female in no obvious distress.  Vital signs:  Show a temperature of 97.6, pulse 57, respiratory rate 16, blood pressure 116/70.  Weight is 123.  Head and neck:  Shows a normocephalic, atraumatic skull.  There are no ocular or oral lesions.  There are no palpable cervical or supraclavicular lymph nodes.  Lungs:  Clear bilaterally.  Cardiac:  Regular rate and rhythm with normal S1, S2.  There are no murmurs, rubs or bruits. Abdomen:  Soft with good bowel sounds.  There is no palpable abdominal mass.  There is no palpable hepatosplenomegaly.  Back:  No tenderness over the spine, ribs or hips.  Extremities:  Show no clubbing, cyanosis or edema.  Skin:  No rashes, ecchymoses or petechiae.  LABORATORY STUDIES:  White cell count 3.6, hemoglobin 11.4, hematocrit 33.9, platelet count 518.  MCV is 113.  Ferritin 143, iron saturation is 32%.  Iron is 76.  IMPRESSION:  Rachael Johnson is a 61 year old white female with essential thrombocythemia.  Her platelet count is up a little  more today.  I do feel that there is a degree of iron deficiency that we do need to treat.  We will go ahead and give her a dose of IV iron.  We will get this done this week.  I want to see her back in about 6 weeks or so, so that we can see how her platelet count has reacted to the iron.  I do not want to adjust the dose of Hydrea right now.  I just want to see if the iron that we give her will help her.    ______________________________ Josph Macho, M.D. PRE/MEDQ  D:  07/08/2012  T:  07/09/2012  Job:  [id number]

## 2012-07-10 ENCOUNTER — Ambulatory Visit: Payer: BC Managed Care – PPO | Admitting: Hematology & Oncology

## 2012-07-10 ENCOUNTER — Other Ambulatory Visit: Payer: BC Managed Care – PPO | Admitting: Lab

## 2012-07-12 ENCOUNTER — Ambulatory Visit (HOSPITAL_BASED_OUTPATIENT_CLINIC_OR_DEPARTMENT_OTHER): Payer: BC Managed Care – PPO

## 2012-07-12 VITALS — BP 124/73 | HR 67 | Temp 96.7°F | Resp 20

## 2012-07-12 DIAGNOSIS — D509 Iron deficiency anemia, unspecified: Secondary | ICD-10-CM

## 2012-07-12 MED ORDER — SODIUM CHLORIDE 0.9 % IJ SOLN
3.0000 mL | Freq: Once | INTRAMUSCULAR | Status: DC | PRN
  Filled 2012-07-12: qty 10

## 2012-07-12 MED ORDER — SODIUM CHLORIDE 0.9 % IV SOLN
1020.0000 mg | Freq: Once | INTRAVENOUS | Status: AC
  Administered 2012-07-12: 1020 mg via INTRAVENOUS
  Filled 2012-07-12: qty 34

## 2012-07-12 MED ORDER — SODIUM CHLORIDE 0.9 % IV SOLN
Freq: Once | INTRAVENOUS | Status: AC
  Administered 2012-07-12: 09:00:00 via INTRAVENOUS

## 2012-07-12 NOTE — Patient Instructions (Signed)
Ferumoxytol injection What is this medicine? FERUMOXYTOL is an iron complex. Iron is used to make healthy red blood cells, which carry oxygen and nutrients throughout the body. This medicine is used to treat iron deficiency anemia in people with chronic kidney disease. This medicine may be used for other purposes; ask your health care provider or pharmacist if you have questions. What should I tell my health care provider before I take this medicine? They need to know if you have any of these conditions: -anemia not caused by low iron levels -high levels of iron in the blood -magnetic resonance imaging (MRI) test scheduled -an unusual or allergic reaction to iron, other medicines, foods, dyes, or preservatives -pregnant or trying to get pregnant -breast-feeding How should I use this medicine? This medicine is for infusion into a vein. It is given by a health care professional in a hospital or clinic setting. Talk to your pediatrician regarding the use of this medicine in children. Special care may be needed. Overdosage: If you think you've taken too much of this medicine contact a poison control center or emergency room at once. Overdosage: If you think you have taken too much of this medicine contact a poison control center or emergency room at once. NOTE: This medicine is only for you. Do not share this medicine with others. What if I miss a dose? It is important not to miss your dose. Call your doctor or health care professional if you are unable to keep an appointment. What may interact with this medicine? This medicine may interact with the following medications: -other iron products This list may not describe all possible interactions. Give your health care provider a list of all the medicines, herbs, non-prescription drugs, or dietary supplements you use. Also tell them if you smoke, drink alcohol, or use illegal drugs. Some items may interact with your medicine. What should I watch  for while using this medicine? Visit your doctor or healthcare professional regularly. Tell your doctor or healthcare professional if your symptoms do not start to get better or if they get worse. You may need blood work done while you are taking this medicine. You may need to follow a special diet. Talk to your doctor. Foods that contain iron include: whole grains/cereals, dried fruits, beans, or peas, leafy green vegetables, and organ meats (liver, kidney). What side effects may I notice from receiving this medicine? Side effects that you should report to your doctor or health care professional as soon as possible: -allergic reactions like skin rash, itching or hives, swelling of the face, lips, or tongue -breathing problems -changes in blood pressure -feeling faint or lightheaded, falls -fever or chills -flushing, sweating, or hot feelings -swelling of the ankles or feet Side effects that usually do not require medical attention (Report these to your doctor or health care professional if they continue or are bothersome.): -diarrhea -headache -nausea, vomiting -stomach pain This list may not describe all possible side effects. Call your doctor for medical advice about side effects. You may report side effects to FDA at 1-800-FDA-1088. Where should I keep my medicine? This drug is given in a hospital or clinic and will not be stored at home. NOTE: This sheet is a summary. It may not cover all possible information. If you have questions about this medicine, talk to your doctor, pharmacist, or health care provider.  2012, Elsevier/Gold Standard. (04/08/2008 9:48:25 PM) 

## 2012-08-14 ENCOUNTER — Telehealth: Payer: Self-pay | Admitting: *Deleted

## 2012-08-14 DIAGNOSIS — Z79899 Other long term (current) drug therapy: Secondary | ICD-10-CM

## 2012-08-14 DIAGNOSIS — E039 Hypothyroidism, unspecified: Secondary | ICD-10-CM

## 2012-08-14 LAB — BASIC METABOLIC PANEL
BUN: 18 mg/dL (ref 6–23)
CO2: 28 mEq/L (ref 19–32)
Calcium: 9.3 mg/dL (ref 8.4–10.5)
Chloride: 104 mEq/L (ref 96–112)
Creat: 0.67 mg/dL (ref 0.50–1.10)
Glucose, Bld: 61 mg/dL — ABNORMAL LOW (ref 70–99)
Potassium: 4.3 mEq/L (ref 3.5–5.3)
Sodium: 140 mEq/L (ref 135–145)

## 2012-08-14 LAB — T4, FREE: Free T4: 1.21 ng/dL (ref 0.80–1.80)

## 2012-08-14 LAB — TSH: TSH: 4.296 u[IU]/mL (ref 0.350–4.500)

## 2012-08-14 NOTE — Telephone Encounter (Signed)
Pt presented to the lab. Orders entered per 01/2012 office note as below:  Please schedule labs prior to next visit  Tsh/free t4-hypothyroidism and chem7-v58.69

## 2012-08-19 ENCOUNTER — Ambulatory Visit: Payer: BC Managed Care – PPO | Admitting: Internal Medicine

## 2012-08-19 ENCOUNTER — Ambulatory Visit (INDEPENDENT_AMBULATORY_CARE_PROVIDER_SITE_OTHER): Payer: BC Managed Care – PPO | Admitting: Family Medicine

## 2012-08-19 ENCOUNTER — Encounter: Payer: Self-pay | Admitting: Family Medicine

## 2012-08-19 ENCOUNTER — Other Ambulatory Visit: Payer: Self-pay | Admitting: *Deleted

## 2012-08-19 VITALS — BP 110/70 | HR 68 | Temp 97.4°F | Ht 63.5 in | Wt 123.0 lb

## 2012-08-19 DIAGNOSIS — F341 Dysthymic disorder: Secondary | ICD-10-CM

## 2012-08-19 DIAGNOSIS — F418 Other specified anxiety disorders: Secondary | ICD-10-CM

## 2012-08-19 DIAGNOSIS — K429 Umbilical hernia without obstruction or gangrene: Secondary | ICD-10-CM

## 2012-08-19 DIAGNOSIS — E039 Hypothyroidism, unspecified: Secondary | ICD-10-CM

## 2012-08-19 DIAGNOSIS — F32A Depression, unspecified: Secondary | ICD-10-CM

## 2012-08-19 DIAGNOSIS — F329 Major depressive disorder, single episode, unspecified: Secondary | ICD-10-CM

## 2012-08-19 DIAGNOSIS — D473 Essential (hemorrhagic) thrombocythemia: Secondary | ICD-10-CM

## 2012-08-19 DIAGNOSIS — F419 Anxiety disorder, unspecified: Secondary | ICD-10-CM

## 2012-08-19 MED ORDER — ALPRAZOLAM 0.5 MG PO TABS: ORAL_TABLET | ORAL | Status: DC

## 2012-08-19 MED ORDER — HYDROXYUREA 500 MG PO CAPS: 500.0000 mg | ORAL_CAPSULE | Freq: Every day | ORAL | Status: DC

## 2012-08-19 NOTE — Telephone Encounter (Signed)
Received refill request from [facility] for Hydrea 500 mg QD. This is a long term med for the pt. Last saw Dr Myna Hidalgo on 12.9.13. Send via e-rx.

## 2012-08-21 ENCOUNTER — Ambulatory Visit (INDEPENDENT_AMBULATORY_CARE_PROVIDER_SITE_OTHER): Payer: Self-pay | Admitting: General Surgery

## 2012-08-25 ENCOUNTER — Encounter: Payer: Self-pay | Admitting: Family Medicine

## 2012-08-25 NOTE — Assessment & Plan Note (Signed)
Well controlled on current dose of Levothyroxine, no changes.

## 2012-08-25 NOTE — Assessment & Plan Note (Signed)
Still struggling with loss of husband, does not feel she needs medications to manage this.

## 2012-08-25 NOTE — Progress Notes (Signed)
Patient ID: Rachael Johnson, female   DOB: 12-30-1950, 62 y.o.   MRN: [id number] Rachael Johnson [id number] [birth year] 08/25/2012      Progress Note-Follow Up  Subjective  Chief Complaint  Chief Complaint  Patient presents with  . Follow-up    6 month    HPI  Patient is a 62 year old female who is in today for followup. Overall she is doing well. No recent illness, fevers, chills, chest pain, palpitations, shortness of breath. Unfortunately she has suffered with the loss her husband. Denies suicidal ideation but acknowledges low mood and intermittent anxiety. No GI or GU complaints noted today. Omeprazole is controlling her reflux as needed.  Past Medical History  Diagnosis Date  . Arthritis   . Allergy   . Thyroid disease   . Diverticulitis   . Frequent episodic tension-type headache   . Hypertension   . Seizure     childhood  . Essential thrombocythemia 01/24/2011  . Anemia, iron deficiency 07/08/2012  . Depression with anxiety 03/24/2011    Past Surgical History  Procedure Date  . Breast surgery 1992    biopsy, benign. Fibrocystic  . Abdominal hysterectomy     1992    Family History  Problem Relation Age of Onset  . Arthritis Mother   . Cancer Mother     ovarian  . Hypertension Mother   . Heart disease Mother   . Arthritis Father   . Cancer Father     lung  . Hypertension Father   . Cancer Sister     lung  . Cancer Brother     lung  . Cancer Brother     prostate    History   Social History  . Marital Status: Widowed    Spouse Name: N/A    Number of Children: 2  . Years of Education: N/A   Occupational History  . APL operator     [employer]   Social History Main Topics  . Smoking status: Former Smoker -- 3.5 packs/day for 5 years    Types: Cigarettes    Quit date: 07/31/1990  . Smokeless tobacco: Never Used  . Alcohol Use: No  . Drug Use: No  . Sexually Active: No   Other Topics Concern  . Not on file   Social History Narrative   Regular exercise: noCaffeine Use: 2-3 weekly    Current Outpatient Prescriptions on File Prior to Visit  Medication Sig Dispense Refill  . acetaminophen-codeine (TYLENOL #3) 300-30 MG per tablet Take 1 tablet by mouth 3 (three) times daily as needed for pain.  60 tablet  3  . aspirin 81 MG tablet Take 81 mg by mouth daily.        . citalopram (CELEXA) 40 MG tablet 60 mg. Take one and a half by mouth daily      . ergocalciferol (VITAMIN D2) 50000 UNITS capsule Take 1 capsule every other week.  6 capsule  4  . estradiol (ESTRACE) 0.5 MG tablet Take 1 tablet (0.5 mg total) by mouth daily.  90 tablet  4  . fluorometholone (FML) 0.1 % ophthalmic suspension Place 1 drop into both eyes as needed.       . furosemide (LASIX) 20 MG tablet Take 1 tablet (20 mg total) by mouth every morning.  30 tablet  6  . levothyroxine (SYNTHROID, LEVOTHROID) 88 MCG tablet Take 1 tablet (88 mcg total) by mouth daily.  30 tablet  3  . Multiple Vitamin (MULTIVITAMIN PO) Take 1  tablet by mouth daily.        . naproxen (NAPROSYN) 500 MG tablet TAKE ONE TABLET BY MOUTH TWICE A DAY WITH MEALS  60 tablet  2  . omeprazole (PRILOSEC) 40 MG capsule Take 1 capsule (40 mg total) by mouth daily.  90 capsule  1  . prochlorperazine (COMPAZINE) 10 MG tablet every 8 (eight) hours as needed.       . SF 1.1 % GEL dental gel 1 application at bedtime.       . triamcinolone cream (KENALOG) 0.1 % Apply topically 2 (two) times daily as needed.  60 g  1  . hydroxyurea (HYDREA) 500 MG capsule Take 1 capsule (500 mg total) by mouth daily. May take with food to minimize GI side effects.  90 capsule  3    No Known Allergies  Review of Systems  Review of Systems  Constitutional: Negative for fever and malaise/fatigue.  HENT: Negative for congestion.   Eyes: Negative for discharge.  Respiratory: Negative for shortness of breath.   Cardiovascular: Negative for chest pain, palpitations and leg swelling.  Gastrointestinal: Negative for nausea,  abdominal pain and diarrhea.  Genitourinary: Negative for dysuria.  Musculoskeletal: Negative for falls.  Skin: Negative for rash.  Neurological: Negative for loss of consciousness and headaches.  Endo/Heme/Allergies: Negative for polydipsia.  Psychiatric/Behavioral: Negative for depression and suicidal ideas. The patient is not nervous/anxious and does not have insomnia.     Objective  BP 110/70  Pulse 68  Temp 97.4 F (36.3 C) (Temporal)  Ht 5' 3.5" (1.613 m)  Wt 123 lb (55.792 kg)  BMI 21.45 kg/m2  SpO2 95%  LMP 07/31/1990  Physical Exam  Physical Exam  Constitutional: She is oriented to person, place, and time and well-developed, well-nourished, and in no distress. No distress.  HENT:  Head: Normocephalic and atraumatic.  Eyes: Conjunctivae normal are normal.  Neck: Neck supple. No thyromegaly present.  Cardiovascular: Normal rate, regular rhythm and normal heart sounds.   No murmur heard. Pulmonary/Chest: Effort normal and breath sounds normal. She has no wheezes.  Abdominal: She exhibits no distension and no mass.  Musculoskeletal: She exhibits no edema.  Lymphadenopathy:    She has no cervical adenopathy.  Neurological: She is alert and oriented to person, place, and time.  Skin: Skin is warm and dry. No rash noted. She is not diaphoretic.  Psychiatric: Memory, affect and judgment normal.    Lab Results  Component Value Date   TSH 4.296 08/14/2012   Lab Results  Component Value Date   WBC 3.6* 07/08/2012   HGB 11.4* 07/08/2012   HCT 33.9* 07/08/2012   MCV 113* 07/08/2012   PLT 518* 07/08/2012   Lab Results  Component Value Date   CREATININE 0.67 08/14/2012   BUN 18 08/14/2012   NA 140 08/14/2012   K 4.3 08/14/2012   CL 104 08/14/2012   CO2 28 08/14/2012   Lab Results  Component Value Date   ALT 12 02/09/2012   AST 20 02/09/2012   ALKPHOS 45 02/09/2012   BILITOT 0.4 02/09/2012   Lab Results  Component Value Date   CHOL 194 09/18/2011   Lab Results    Component Value Date   HDL 63 09/18/2011   Lab Results  Component Value Date   LDLCALC 122* 09/18/2011   Lab Results  Component Value Date   TRIG 47 09/18/2011   Lab Results  Component Value Date   CHOLHDL 3.1 09/18/2011     Assessment &  Plan  Hypothyroid Well controlled on current dose of Levothyroxine, no changes.  Depression with anxiety Still struggling with loss of husband, does not feel she needs medications to manage this.

## 2012-08-27 ENCOUNTER — Ambulatory Visit: Payer: BC Managed Care – PPO | Admitting: Family Medicine

## 2012-08-28 ENCOUNTER — Ambulatory Visit (INDEPENDENT_AMBULATORY_CARE_PROVIDER_SITE_OTHER): Payer: BC Managed Care – PPO | Admitting: Surgery

## 2012-09-02 ENCOUNTER — Other Ambulatory Visit: Payer: Self-pay | Admitting: Internal Medicine

## 2012-09-02 ENCOUNTER — Ambulatory Visit (HOSPITAL_BASED_OUTPATIENT_CLINIC_OR_DEPARTMENT_OTHER): Payer: BC Managed Care – PPO | Admitting: Hematology & Oncology

## 2012-09-02 ENCOUNTER — Other Ambulatory Visit (HOSPITAL_BASED_OUTPATIENT_CLINIC_OR_DEPARTMENT_OTHER): Payer: BC Managed Care – PPO | Admitting: Lab

## 2012-09-02 VITALS — BP 100/57 | HR 62 | Temp 98.0°F | Resp 16 | Ht 63.0 in | Wt 120.0 lb

## 2012-09-02 DIAGNOSIS — D509 Iron deficiency anemia, unspecified: Secondary | ICD-10-CM

## 2012-09-02 DIAGNOSIS — R1033 Periumbilical pain: Secondary | ICD-10-CM

## 2012-09-02 DIAGNOSIS — F411 Generalized anxiety disorder: Secondary | ICD-10-CM

## 2012-09-02 DIAGNOSIS — D473 Essential (hemorrhagic) thrombocythemia: Secondary | ICD-10-CM

## 2012-09-02 LAB — CBC WITH DIFFERENTIAL (CANCER CENTER ONLY)
BASO#: 0 10*3/uL (ref 0.0–0.2)
BASO%: 0.6 % (ref 0.0–2.0)
EOS%: 2.8 % (ref 0.0–7.0)
Eosinophils Absolute: 0.1 10*3/uL (ref 0.0–0.5)
HCT: 36.4 % (ref 34.8–46.6)
HGB: 12.2 g/dL (ref 11.6–15.9)
LYMPH#: 1.1 10*3/uL (ref 0.9–3.3)
LYMPH%: 30.7 % (ref 14.0–48.0)
MCH: 38.1 pg — ABNORMAL HIGH (ref 26.0–34.0)
MCHC: 33.5 g/dL (ref 32.0–36.0)
MCV: 114 fL — ABNORMAL HIGH (ref 81–101)
MONO#: 0.3 10*3/uL (ref 0.1–0.9)
MONO%: 7.5 % (ref 0.0–13.0)
NEUT#: 2.1 10*3/uL (ref 1.5–6.5)
NEUT%: 58.4 % (ref 39.6–80.0)
Platelets: 527 10*3/uL — ABNORMAL HIGH (ref 145–400)
RBC: 3.2 10*6/uL — ABNORMAL LOW (ref 3.70–5.32)
RDW: 13.3 % (ref 11.1–15.7)
WBC: 3.6 10*3/uL — ABNORMAL LOW (ref 3.9–10.0)

## 2012-09-02 LAB — IRON AND TIBC
%SAT: 37 % (ref 20–55)
Iron: 87 ug/dL (ref 42–145)
TIBC: 234 ug/dL — ABNORMAL LOW (ref 250–470)
UIBC: 147 ug/dL (ref 125–400)

## 2012-09-02 LAB — FERRITIN: Ferritin: 436 ng/mL — ABNORMAL HIGH (ref 10–291)

## 2012-09-02 LAB — CHCC SATELLITE - SMEAR

## 2012-09-02 NOTE — Progress Notes (Signed)
This office note has been dictated.

## 2012-09-03 ENCOUNTER — Ambulatory Visit (INDEPENDENT_AMBULATORY_CARE_PROVIDER_SITE_OTHER): Payer: BC Managed Care – PPO | Admitting: Surgery

## 2012-09-03 ENCOUNTER — Encounter (INDEPENDENT_AMBULATORY_CARE_PROVIDER_SITE_OTHER): Payer: Self-pay | Admitting: Surgery

## 2012-09-03 VITALS — BP 124/72 | HR 57 | Temp 98.4°F | Resp 16 | Ht 63.5 in | Wt 121.4 lb

## 2012-09-03 DIAGNOSIS — K429 Umbilical hernia without obstruction or gangrene: Secondary | ICD-10-CM

## 2012-09-03 NOTE — Progress Notes (Signed)
CC:   Rachael Arbour, MD Timothy P. Fontaine, M.D. Abigail Miyamoto, M.D.  DIAGNOSES: 1. Essential thrombocythemia-JAK2 negative. 2. Intermittent iron-deficiency anemia.  CURRENT THERAPY: 1. Hydrea 500 mg p.o. daily. 2. IV iron as indicated.  INTERIM HISTORY:  Rachael Johnson comes in for followup.  She is still having a tough time with her husband's death.  It has been a year and a half. She had a hard time over Christmas.  She is still working.  She has a lot of anxiety at work.  She also has anxiety when she goes out.  She does have a new dog.  She wants to know if her dog can go with her to work and wherever she goes for shopping. Again, this is something that has been very difficult for her.  We do give her iron on occasion.  She always responds to the iron.  The last time we saw her, her ferritin was 143 with an iron saturation of 32%.  She has had no pain.  She is complaining of some discomfort around the umbilicus.  She is going to see Dr. Magnus Ivan of Surgery about the possibility of an umbilical hernia.  PHYSICAL EXAMINATION:  General:  This is a thin white female in no obvious distress.  Vital signs:  Temperature of 98, pulse 62, respiratory rate 16, blood pressure 100/57.  Weight is 120.  Head and neck:  Normocephalic, atraumatic skull.  There are no ocular or oral lesions.  There are no palpable cervical or supraclavicular lymph nodes. Lungs:  Clear bilaterally.  Cardiac:  Regular rate and rhythm with a normal S1 and S2.  There are no murmurs, rubs, or bruits.  Abdomen: Soft with good bowel sounds.  There is no palpable abdominal mass. There is no fluid wave.  There is no palpable hepatosplenomegaly.  Back: No tenderness of the spine, ribs, or hips.  Extremities:  No clubbing, cyanosis, or edema.  Skin:  No rashes, ecchymosis, or petechia.  She has no erythema on the palms or soles of her feet.  LABORATORY STUDIES:  White cell count is 3.6, hemoglobin 12.2, hematocrit  36.4, platelet count 527.  MCV is 114.  IMPRESSION:  Rachael Johnson is a 62 year old white female with essential thrombocythemia.  She is doing well.  Her platelet count really is holding steady.  For now, I think we can probably get her back in 3 months.  I do not think we need any blood work in between visits.  We are checking her iron studies.  Addendum:  If Rachael Johnson needs any surgery, I do not see any problems with her undergoing surgery from a hematologic point of view.  I do not believe she is at any increased risk of bleeding or blood clotting.    ______________________________ Josph Macho, M.D. PRE/MEDQ  D:  09/02/2012  T:  09/03/2012  Job:  [id number]

## 2012-09-03 NOTE — Progress Notes (Signed)
Patient ID: [name], female   DOB: 13-Sep-1950, 62 y.o.   MRN: [id number]  Chief Complaint  Patient presents with  . New Evaluation    umb hernia    HPI Rachael Johnson is a 62 y.o. female.   HPI She is referred by Dr. Danise Edge for evaluation of umbilical hernia. She has noticed that over the last several months. She is having increasing discomfort at the umbilicus. She does a lot of heavy lifting at work. She describes the pain as sharp and intermittent. It is moderate in intensity. He does not refer anywhere else. She has had no obstructive symptoms. Past Medical History  Diagnosis Date  . Arthritis   . Allergy   . Thyroid disease   . Diverticulitis   . Frequent episodic tension-type headache   . Hypertension   . Seizure     childhood  . Essential thrombocythemia 01/24/2011  . Anemia, iron deficiency 07/08/2012  . Depression with anxiety 03/24/2011    Past Surgical History  Procedure Date  . Breast surgery 1992    biopsy, benign. Fibrocystic  . Abdominal hysterectomy     1992    Family History  Problem Relation Age of Onset  . Arthritis Mother   . Cancer Mother     ovarian  . Hypertension Mother   . Heart disease Mother   . Heart failure Mother   . Arthritis Father   . Cancer Father     lung  . Hypertension Father   . Cancer Sister     lung  . Cancer Brother     lung  . Cancer Brother     prostate    Social History History  Substance Use Topics  . Smoking status: Former Smoker -- 3.5 packs/day for 5 years    Types: Cigarettes    Quit date: 07/31/1990  . Smokeless tobacco: Never Used  . Alcohol Use: No    No Known Allergies  Current Outpatient Prescriptions  Medication Sig Dispense Refill  . acetaminophen-codeine (TYLENOL #3) 300-30 MG per tablet Take 1 tablet by mouth 3 (three) times daily as needed for pain.  60 tablet  3  . ALPRAZolam (XANAX) 0.5 MG tablet 1 tab tid and 1-2 tabs po qhs prn anxiety, insomnia      . aspirin 81 MG tablet  Take 81 mg by mouth daily.        . citalopram (CELEXA) 40 MG tablet 60 mg. Take one and a half by mouth daily      . ergocalciferol (VITAMIN D2) 50000 UNITS capsule Take 1 capsule every other week.  6 capsule  4  . estradiol (ESTRACE) 0.5 MG tablet Take 1 tablet (0.5 mg total) by mouth daily.  90 tablet  4  . fluorometholone (FML) 0.1 % ophthalmic suspension Place 1 drop into both eyes as needed.       . furosemide (LASIX) 20 MG tablet Take 1 tablet (20 mg total) by mouth every morning.  30 tablet  6  . hydroxyurea (HYDREA) 500 MG capsule Take 1 capsule (500 mg total) by mouth daily. May take with food to minimize GI side effects.  90 capsule  3  . levothyroxine (SYNTHROID, LEVOTHROID) 88 MCG tablet Take 1 tablet (88 mcg total) by mouth daily.  30 tablet  3  . Multiple Vitamin (MULTIVITAMIN PO) Take 1 tablet by mouth daily.        . naproxen (NAPROSYN) 500 MG tablet TAKE ONE TABLET BY MOUTH TWICE  A DAY WITH MEALS  60 tablet  1  . omeprazole (PRILOSEC) 40 MG capsule Take 1 capsule (40 mg total) by mouth daily.  90 capsule  1  . prochlorperazine (COMPAZINE) 10 MG tablet every 8 (eight) hours as needed.       . SF 1.1 % GEL dental gel 1 application as needed.       . triamcinolone cream (KENALOG) 0.1 % Apply topically 2 (two) times daily as needed.  60 g  1    Review of Systems Review of Systems  Constitutional: Negative for fever, chills and unexpected weight change.  HENT: Negative for hearing loss, congestion, sore throat, trouble swallowing and voice change.   Eyes: Negative for visual disturbance.  Respiratory: Negative for cough and wheezing.   Cardiovascular: Positive for leg swelling. Negative for chest pain and palpitations.  Gastrointestinal: Positive for nausea, abdominal pain and constipation. Negative for vomiting, diarrhea, blood in stool, abdominal distention and anal bleeding.  Genitourinary: Negative for hematuria, vaginal bleeding and difficulty urinating.  Musculoskeletal:  Positive for arthralgias.  Skin: Negative for rash and wound.  Neurological: Positive for headaches. Negative for seizures and syncope.  Hematological: Negative for adenopathy. Does not bruise/bleed easily.  Psychiatric/Behavioral: Negative for confusion.    Blood pressure 124/72, pulse 57, temperature 98.4 F (36.9 C), temperature source Temporal, resp. rate 16, height 5' 3.5" (1.613 m), weight 121 lb 6.4 oz (55.067 kg), last menstrual period 07/31/1990.  Physical Exam Physical Exam  Constitutional: She is oriented to person, place, and time. She appears well-developed and well-nourished. No distress.  HENT:  Head: Normocephalic and atraumatic.  Right Ear: External ear normal.  Left Ear: External ear normal.  Nose: Nose normal.  Mouth/Throat: Oropharynx is clear and moist.  Eyes: Conjunctivae normal are normal. Pupils are equal, round, and reactive to light. Right eye exhibits no discharge. Left eye exhibits no discharge. No scleral icterus.  Neck: Normal range of motion. No tracheal deviation present.  Cardiovascular: Normal rate, regular rhythm, normal heart sounds and intact distal pulses.   No murmur heard. Pulmonary/Chest: Effort normal and breath sounds normal. No respiratory distress. She has no wheezes.  Abdominal: Soft. Bowel sounds are normal. She exhibits no distension. There is tenderness. There is no guarding.       She has a small well-healed incision just below the umbilicus. There is a very tiny umbilical hernia containing incarcerated omentum. Her abdomen is otherwise soft and nontender  Musculoskeletal: Normal range of motion. She exhibits no edema and no tenderness.  Lymphadenopathy:    She has no cervical adenopathy.  Neurological: She is alert and oriented to person, place, and time.  Skin: Skin is warm and dry. No rash noted. She is not diaphoretic. No erythema.  Psychiatric: Her behavior is normal. Judgment normal.    Data Reviewed   Assessment     Umbilical hernia    Plan    Repair with possible mesh is recommended as she is symptomatic. I discussed the surgical procedure with her in detail. I discussed the risk which includes but is not limited to bleeding, infection, injury to surrounding structures, recurrence, et Karie Soda. She understands and wishes to proceed. Surgery will be scheduled. Likely of success is good.       Rachael Johnson A 09/03/2012, 11:27 AM

## 2012-09-04 ENCOUNTER — Telehealth: Payer: Self-pay | Admitting: *Deleted

## 2012-09-04 NOTE — Telephone Encounter (Signed)
Called patient to let her know that her iron levels are good per dr. Myna Hidalgo. Left message on personal answering machine.

## 2012-09-04 NOTE — Telephone Encounter (Signed)
Message copied by Anselm Jungling on Wed Sep 04, 2012  9:45 AM ------      Message from: Josph Macho      Created: Tue Sep 03, 2012  2:50 PM       Please call and tell her that her iron is good. Thanks. Cindee Lame

## 2012-09-16 ENCOUNTER — Encounter (HOSPITAL_COMMUNITY): Payer: Self-pay | Admitting: Pharmacy Technician

## 2012-09-18 NOTE — Patient Instructions (Addendum)
20 Rachael Johnson  09/18/2012   Your procedure is scheduled on:  09/25/12  [facility]  Report to [facility] at  0830     AM.  Call this number if you have problems the morning of surgery: [phone]       Remember:   Do not eat food  Or drink :After Midnight. Tuesday NIGHT   Take these medicines the morning of surgery with A SIP OF WATER: Citalopram, Levothyroxine, Omeprazole                                    May take Xanax, Compazine,  Or and/ tylenol with codeine  if needed   .  Contacts, dentures or partial plates can not be worn to surgery  Leave suitcase in the car. After surgery it may be brought to your room.  For patients admitted to the hospital, checkout time is 11:00 AM day of  discharge.             SPECIAL INSTRUCTIONS- SEE [place] PREPARING FOR SURGERY INSTRUCTION SHEET-     DO NOT WEAR JEWELRY, LOTIONS, POWDERS, OR PERFUMES.  WOMEN-- DO NOT SHAVE LEGS OR UNDERARMS FOR 12 HOURS BEFORE SHOWERS. MEN MAY SHAVE FACE.  Patients discharged the day of surgery will not be allowed to drive home. IF going home the day of surgery, you must have a driver and someone to stay with you for the first 24 hours  Name and phone number of your driver:    Son  SCOTTIE                                                                    Please read over the following fact sheets that you were given: MRSA Information, Incentive Spirometry Sheet, Blood Transfusion Sheet  Information                                                                                   [name]  PST 336  [id number]                 FAILURE TO FOLLOW THESE INSTRUCTIONS MAY RESULT IN  CANCELLATION   OF YOUR SURGERY                                                  Patient Signature _____________________________

## 2012-09-18 NOTE — Progress Notes (Signed)
OV, CBC with review 09/03/11 DE ENNOVER

## 2012-09-19 ENCOUNTER — Encounter (HOSPITAL_COMMUNITY): Payer: Self-pay

## 2012-09-19 ENCOUNTER — Ambulatory Visit (HOSPITAL_COMMUNITY)
Admission: RE | Admit: 2012-09-19 | Discharge: 2012-09-19 | Disposition: A | Discharge status: Home / Self Care | Payer: BC Managed Care – PPO | Source: Ambulatory Visit | Attending: Orthopaedic Surgery | Admitting: Orthopaedic Surgery

## 2012-09-19 ENCOUNTER — Encounter (HOSPITAL_COMMUNITY)
Admission: RE | Admit: 2012-09-19 | Discharge: 2012-09-19 | Disposition: A | Discharge status: Home / Self Care | Payer: BC Managed Care – PPO | Source: Ambulatory Visit | Attending: Orthopaedic Surgery | Admitting: Orthopaedic Surgery

## 2012-09-19 DIAGNOSIS — Z01818 Encounter for other preprocedural examination: Secondary | ICD-10-CM | POA: Insufficient documentation

## 2012-09-19 DIAGNOSIS — Z01812 Encounter for preprocedural laboratory examination: Secondary | ICD-10-CM | POA: Insufficient documentation

## 2012-09-19 HISTORY — DX: Gastro-esophageal reflux disease without esophagitis: K21.9

## 2012-09-19 LAB — COMPREHENSIVE METABOLIC PANEL
ALT: 17 U/L (ref 0–35)
AST: 23 U/L (ref 0–37)
Albumin: 3.8 g/dL (ref 3.5–5.2)
Alkaline Phosphatase: 52 U/L (ref 39–117)
BUN: 13 mg/dL (ref 6–23)
CO2: 29 mEq/L (ref 19–32)
Calcium: 8.7 mg/dL (ref 8.4–10.5)
Chloride: 102 mEq/L (ref 96–112)
Creatinine, Ser: 0.66 mg/dL (ref 0.50–1.10)
GFR calc Af Amer: >90 mL/min (ref >90)
GFR calc non Af Amer: >90 mL/min (ref >90)
Glucose, Bld: 74 mg/dL (ref 70–99)
Potassium: 3.9 mEq/L (ref 3.5–5.1)
Sodium: 137 mEq/L (ref 135–145)
Total Bilirubin: 0.3 mg/dL (ref 0.3–1.2)
Total Protein: 6.5 g/dL (ref 6.0–8.3)

## 2012-09-19 LAB — SURGICAL PCR SCREEN
MRSA, PCR: NEGATIVE
Staphylococcus aureus: NEGATIVE

## 2012-09-24 NOTE — H&P (Signed)
Patient ID: Rachael Johnson, female DOB: 1950-10-09, 62 y.o. MRN: [id number]  Chief Complaint   Patient presents with   .  New Evaluation     umb hernia   HPI  Rachael Johnson is a 62 y.o. female.  HPI  She is referred by Dr. Danise Edge for evaluation of umbilical hernia. She has noticed that over the last several months. She is having increasing discomfort at the umbilicus. She does a lot of heavy lifting at work. She describes the pain as sharp and intermittent. It is moderate in intensity. He does not refer anywhere else. She has had no obstructive symptoms.  Past Medical History   Diagnosis  Date   .  Arthritis    .  Allergy    .  Thyroid disease    .  Diverticulitis    .  Frequent episodic tension-type headache    .  Hypertension    .  Seizure      childhood   .  Essential thrombocythemia  01/24/2011   .  Anemia, iron deficiency  07/08/2012   .  Depression with anxiety  03/24/2011    Past Surgical History   Procedure  Date   .  Breast surgery  1992     biopsy, benign. Fibrocystic   .  Abdominal hysterectomy      1992    Family History   Problem  Relation  Age of Onset   .  Arthritis  Mother    .  Cancer  Mother       ovarian    .  Hypertension  Mother    .  Heart disease  Mother    .  Heart failure  Mother    .  Arthritis  Father    .  Cancer  Father       lung    .  Hypertension  Father    .  Cancer  Sister       lung    .  Cancer  Brother       lung    .  Cancer  Brother       prostate   Social History  History   Substance Use Topics   .  Smoking status:  Former Smoker -- 3.5 packs/day for 5 years     Types:  Cigarettes     Quit date:  07/31/1990   .  Smokeless tobacco:  Never Used   .  Alcohol Use:  No   No Known Allergies  Current Outpatient Prescriptions   Medication  Sig  Dispense  Refill   .  acetaminophen-codeine (TYLENOL #3) 300-30 MG per tablet  Take 1 tablet by mouth 3 (three) times daily as needed for pain.  60 tablet  3   .   ALPRAZolam (XANAX) 0.5 MG tablet  1 tab tid and 1-2 tabs po qhs prn anxiety, insomnia     .  aspirin 81 MG tablet  Take 81 mg by mouth daily.     .  citalopram (CELEXA) 40 MG tablet  60 mg. Take one and a half by mouth daily     .  ergocalciferol (VITAMIN D2) 50000 UNITS capsule  Take 1 capsule every other week.  6 capsule  4   .  estradiol (ESTRACE) 0.5 MG tablet  Take 1 tablet (0.5 mg total) by mouth daily.  90 tablet  4   .  fluorometholone (FML) 0.1 % ophthalmic suspension  Place 1  drop into both eyes as needed.     .  furosemide (LASIX) 20 MG tablet  Take 1 tablet (20 mg total) by mouth every morning.  30 tablet  6   .  hydroxyurea (HYDREA) 500 MG capsule  Take 1 capsule (500 mg total) by mouth daily. May take with food to minimize GI side effects.  90 capsule  3   .  levothyroxine (SYNTHROID, LEVOTHROID) 88 MCG tablet  Take 1 tablet (88 mcg total) by mouth daily.  30 tablet  3   .  Multiple Vitamin (MULTIVITAMIN PO)  Take 1 tablet by mouth daily.     .  naproxen (NAPROSYN) 500 MG tablet  TAKE ONE TABLET BY MOUTH TWICE A DAY WITH MEALS  60 tablet  1   .  omeprazole (PRILOSEC) 40 MG capsule  Take 1 capsule (40 mg total) by mouth daily.  90 capsule  1   .  prochlorperazine (COMPAZINE) 10 MG tablet  every 8 (eight) hours as needed.     .  SF 1.1 % GEL dental gel  1 application as needed.     .  triamcinolone cream (KENALOG) 0.1 %  Apply topically 2 (two) times daily as needed.  60 g  1   Review of Systems  Review of Systems  Constitutional: Negative for fever, chills and unexpected weight change.  HENT: Negative for hearing loss, congestion, sore throat, trouble swallowing and voice change.  Eyes: Negative for visual disturbance.  Respiratory: Negative for cough and wheezing.  Cardiovascular: Positive for leg swelling. Negative for chest pain and palpitations.  Gastrointestinal: Positive for nausea, abdominal pain and constipation. Negative for vomiting, diarrhea, blood in stool, abdominal  distention and anal bleeding.  Genitourinary: Negative for hematuria, vaginal bleeding and difficulty urinating.  Musculoskeletal: Positive for arthralgias.  Skin: Negative for rash and wound.  Neurological: Positive for headaches. Negative for seizures and syncope.  Hematological: Negative for adenopathy. Does not bruise/bleed easily.  Psychiatric/Behavioral: Negative for confusion.  Blood pressure 124/72, pulse 57, temperature 98.4 F (36.9 C), temperature source Temporal, resp. rate 16, height 5' 3.5" (1.613 m), weight 121 lb 6.4 oz (55.067 kg), last menstrual period 07/31/1990.  Physical Exam  Physical Exam  Constitutional: She is oriented to person, place, and time. She appears well-developed and well-nourished. No distress.  HENT:  Head: Normocephalic and atraumatic.  Right Ear: External ear normal.  Left Ear: External ear normal.  Nose: Nose normal.  Mouth/Throat: Oropharynx is clear and moist.  Eyes: Conjunctivae normal are normal. Pupils are equal, round, and reactive to light. Right eye exhibits no discharge. Left eye exhibits no discharge. No scleral icterus.  Neck: Normal range of motion. No tracheal deviation present.  Cardiovascular: Normal rate, regular rhythm, normal heart sounds and intact distal pulses.  No murmur heard.  Pulmonary/Chest: Effort normal and breath sounds normal. No respiratory distress. She has no wheezes.  Abdominal: Soft. Bowel sounds are normal. She exhibits no distension. There is tenderness. There is no guarding.  She has a small well-healed incision just below the umbilicus. There is a very tiny umbilical hernia containing incarcerated omentum. Her abdomen is otherwise soft and nontender  Musculoskeletal: Normal range of motion. She exhibits no edema and no tenderness.  Lymphadenopathy:  She has no cervical adenopathy.  Neurological: She is alert and oriented to person, place, and time.  Skin: Skin is warm and dry. No rash noted. She is not  diaphoretic. No erythema.  Psychiatric: Her behavior is normal. Judgment normal.  Data Reviewed  Assessment  Umbilical hernia  Plan  Repair with possible mesh is recommended as she is symptomatic. I discussed the surgical procedure with her in detail. I discussed the risk which includes but is not limited to bleeding, infection, injury to surrounding structures, recurrence, et Karie Soda. She understands and wishes to proceed. Surgery will be scheduled. Likely of success is good.

## 2012-09-24 NOTE — Progress Notes (Signed)
Notified patient of surgery change time and to be in short stay at 0800

## 2012-09-25 ENCOUNTER — Ambulatory Visit (HOSPITAL_COMMUNITY): Payer: BC Managed Care – PPO | Admitting: Anesthesiology

## 2012-09-25 ENCOUNTER — Ambulatory Visit (HOSPITAL_COMMUNITY)
Admission: RE | Admit: 2012-09-25 | Discharge: 2012-09-25 | Disposition: A | Discharge status: Home / Self Care | Payer: BC Managed Care – PPO | Source: Ambulatory Visit | Attending: Surgery | Admitting: Surgery

## 2012-09-25 ENCOUNTER — Encounter (HOSPITAL_COMMUNITY): Payer: Self-pay | Admitting: *Deleted

## 2012-09-25 ENCOUNTER — Encounter (HOSPITAL_COMMUNITY)
Admission: RE | Disposition: A | Discharge status: Home / Self Care | Payer: Self-pay | Source: Ambulatory Visit | Attending: Surgery

## 2012-09-25 ENCOUNTER — Encounter (HOSPITAL_COMMUNITY): Payer: Self-pay | Admitting: Anesthesiology

## 2012-09-25 DIAGNOSIS — K219 Gastro-esophageal reflux disease without esophagitis: Secondary | ICD-10-CM | POA: Insufficient documentation

## 2012-09-25 DIAGNOSIS — F3289 Other specified depressive episodes: Secondary | ICD-10-CM | POA: Insufficient documentation

## 2012-09-25 DIAGNOSIS — E039 Hypothyroidism, unspecified: Secondary | ICD-10-CM | POA: Insufficient documentation

## 2012-09-25 DIAGNOSIS — F329 Major depressive disorder, single episode, unspecified: Secondary | ICD-10-CM | POA: Insufficient documentation

## 2012-09-25 DIAGNOSIS — Z79899 Other long term (current) drug therapy: Secondary | ICD-10-CM | POA: Insufficient documentation

## 2012-09-25 DIAGNOSIS — Z7982 Long term (current) use of aspirin: Secondary | ICD-10-CM | POA: Insufficient documentation

## 2012-09-25 DIAGNOSIS — R51 Headache: Secondary | ICD-10-CM | POA: Insufficient documentation

## 2012-09-25 DIAGNOSIS — K429 Umbilical hernia without obstruction or gangrene: Secondary | ICD-10-CM

## 2012-09-25 DIAGNOSIS — R569 Unspecified convulsions: Secondary | ICD-10-CM | POA: Insufficient documentation

## 2012-09-25 DIAGNOSIS — K420 Umbilical hernia with obstruction, without gangrene: Secondary | ICD-10-CM | POA: Insufficient documentation

## 2012-09-25 DIAGNOSIS — I10 Essential (primary) hypertension: Secondary | ICD-10-CM | POA: Insufficient documentation

## 2012-09-25 HISTORY — PX: UMBILICAL HERNIA REPAIR: SHX196

## 2012-09-25 SURGERY — REPAIR, HERNIA, UMBILICAL, ADULT
Anesthesia: General | Site: Abdomen | Wound class: Clean

## 2012-09-25 MED ORDER — HYDROCODONE-ACETAMINOPHEN 5-325 MG PO TABS: 1.0000 | ORAL_TABLET | ORAL | Status: DC | PRN

## 2012-09-25 MED ORDER — LACTATED RINGERS IV SOLN
INTRAVENOUS | Status: DC
Start: 2012-09-25 — End: 2012-09-25
  Administered 2012-09-25: 1000 mL via INTRAVENOUS

## 2012-09-25 MED ORDER — HYDROMORPHONE HCL PF 1 MG/ML IJ SOLN: 0.2500 mg | INTRAMUSCULAR | Status: DC | PRN

## 2012-09-25 MED ORDER — ACETAMINOPHEN 10 MG/ML IV SOLN
INTRAVENOUS | Status: AC
  Filled 2012-09-25: qty 100

## 2012-09-25 MED ORDER — CEFAZOLIN SODIUM-DEXTROSE 2-3 GM-% IV SOLR
2.0000 g | INTRAVENOUS | Status: AC
  Administered 2012-09-25: 2 g via INTRAVENOUS

## 2012-09-25 MED ORDER — 0.9 % SODIUM CHLORIDE (POUR BTL) OPTIME
TOPICAL | Status: DC | PRN
  Administered 2012-09-25: 1000 mL

## 2012-09-25 MED ORDER — ONDANSETRON HCL 4 MG/2ML IJ SOLN: 4.0000 mg | Freq: Four times a day (QID) | INTRAMUSCULAR | Status: DC | PRN

## 2012-09-25 MED ORDER — PROPOFOL 10 MG/ML IV BOLUS
INTRAVENOUS | Status: DC | PRN
  Administered 2012-09-25: 120 mg via INTRAVENOUS

## 2012-09-25 MED ORDER — LACTATED RINGERS IR SOLN: Status: DC | PRN

## 2012-09-25 MED ORDER — ACETAMINOPHEN 325 MG PO TABS: 650.0000 mg | ORAL_TABLET | ORAL | Status: DC | PRN

## 2012-09-25 MED ORDER — BUPIVACAINE HCL (PF) 0.5 % IJ SOLN
INTRAMUSCULAR | Status: AC
  Filled 2012-09-25: qty 30

## 2012-09-25 MED ORDER — BUPIVACAINE HCL (PF) 0.5 % IJ SOLN
INTRAMUSCULAR | Status: DC | PRN
  Administered 2012-09-25: 18 mL

## 2012-09-25 MED ORDER — MEPERIDINE HCL 50 MG/ML IJ SOLN: 6.2500 mg | INTRAMUSCULAR | Status: DC | PRN

## 2012-09-25 MED ORDER — LIDOCAINE HCL (CARDIAC) 20 MG/ML IV SOLN
INTRAVENOUS | Status: DC | PRN
  Administered 2012-09-25: 50 mg via INTRAVENOUS

## 2012-09-25 MED ORDER — SODIUM CHLORIDE 0.9 % IV SOLN: 250.0000 mL | INTRAVENOUS | Status: DC | PRN

## 2012-09-25 MED ORDER — ACETAMINOPHEN 10 MG/ML IV SOLN: 1000.0000 mg | Freq: Once | INTRAVENOUS | Status: DC | PRN

## 2012-09-25 MED ORDER — ACETAMINOPHEN 650 MG RE SUPP
650.0000 mg | RECTAL | Status: DC | PRN
  Filled 2012-09-25: qty 1

## 2012-09-25 MED ORDER — ACETAMINOPHEN 10 MG/ML IV SOLN
INTRAVENOUS | Status: DC | PRN
  Administered 2012-09-25: 1000 mg via INTRAVENOUS

## 2012-09-25 MED ORDER — OXYCODONE HCL 5 MG/5ML PO SOLN: 5.0000 mg | Freq: Once | ORAL | Status: AC | PRN

## 2012-09-25 MED ORDER — OXYCODONE HCL 5 MG PO TABS: 5.0000 mg | ORAL_TABLET | ORAL | Status: DC | PRN

## 2012-09-25 MED ORDER — MIDAZOLAM HCL 5 MG/5ML IJ SOLN
INTRAMUSCULAR | Status: DC | PRN
  Administered 2012-09-25: 2 mg via INTRAVENOUS

## 2012-09-25 MED ORDER — OXYCODONE HCL 5 MG PO TABS
5.0000 mg | ORAL_TABLET | Freq: Once | ORAL | Status: AC | PRN
  Administered 2012-09-25: 5 mg via ORAL
  Filled 2012-09-25: qty 1

## 2012-09-25 MED ORDER — LACTATED RINGERS IV SOLN: INTRAVENOUS | Status: DC

## 2012-09-25 MED ORDER — FENTANYL CITRATE 0.05 MG/ML IJ SOLN
INTRAMUSCULAR | Status: DC | PRN
  Administered 2012-09-25: 100 ug via INTRAVENOUS

## 2012-09-25 MED ORDER — SODIUM CHLORIDE 0.9 % IJ SOLN: 3.0000 mL | INTRAMUSCULAR | Status: DC | PRN

## 2012-09-25 MED ORDER — PHENYLEPHRINE HCL 10 MG/ML IJ SOLN
INTRAMUSCULAR | Status: DC | PRN
  Administered 2012-09-25 (×2): 40 ug via INTRAVENOUS

## 2012-09-25 MED ORDER — MORPHINE SULFATE 10 MG/ML IJ SOLN: 2.0000 mg | INTRAMUSCULAR | Status: DC | PRN

## 2012-09-25 MED ORDER — CEFAZOLIN SODIUM-DEXTROSE 2-3 GM-% IV SOLR
INTRAVENOUS | Status: AC
  Filled 2012-09-25: qty 50

## 2012-09-25 MED ORDER — PROMETHAZINE HCL 25 MG/ML IJ SOLN: 6.2500 mg | INTRAMUSCULAR | Status: DC | PRN

## 2012-09-25 MED ORDER — LACTATED RINGERS IV SOLN
INTRAVENOUS | Status: DC | PRN
  Administered 2012-09-25: 10:00:00 via INTRAVENOUS

## 2012-09-25 MED ORDER — SODIUM CHLORIDE 0.9 % IJ SOLN: 3.0000 mL | Freq: Two times a day (BID) | INTRAMUSCULAR | Status: DC

## 2012-09-25 SURGICAL SUPPLY — 35 items
APL SKNCLS STERI-STRIP NONHPOA (GAUZE/BANDAGES/DRESSINGS) ×1
BENZOIN TINCTURE PRP APPL 2/3 (GAUZE/BANDAGES/DRESSINGS) ×3 IMPLANT
BINDER ABD UNIV 12 45-62 (WOUND CARE) IMPLANT
BINDER ABDOMINAL 46IN 62IN (WOUND CARE)
BLADE EXTENDED COATED 6.5IN (ELECTRODE) IMPLANT
BLADE HEX COATED 2.75 (ELECTRODE) ×3 IMPLANT
CANISTER SUCTION 2500CC (MISCELLANEOUS) ×3 IMPLANT
CLOTH BEACON ORANGE TIMEOUT ST (SAFETY) ×3 IMPLANT
CLSR STERI-STRIP ANTIMIC 1/2X4 (GAUZE/BANDAGES/DRESSINGS) ×3 IMPLANT
DECANTER SPIKE VIAL GLASS SM (MISCELLANEOUS) IMPLANT
DRAPE LAPAROSCOPIC ABDOMINAL (DRAPES) ×3 IMPLANT
DRSG TEGADERM 4X4.75 (GAUZE/BANDAGES/DRESSINGS) ×3 IMPLANT
ELECT REM PT RETURN 9FT ADLT (ELECTROSURGICAL) ×3
ELECTRODE REM PT RTRN 9FT ADLT (ELECTROSURGICAL) ×2 IMPLANT
GLOVE BIOGEL PI IND STRL 7.0 (GLOVE) ×2 IMPLANT
GLOVE BIOGEL PI INDICATOR 7.0 (GLOVE) ×1
GLOVE SURG SIGNA 7.5 PF LTX (GLOVE) ×6 IMPLANT
GOWN STRL NON-REIN LRG LVL3 (GOWN DISPOSABLE) ×3 IMPLANT
GOWN STRL REIN XL XLG (GOWN DISPOSABLE) ×6 IMPLANT
KIT BASIN OR (CUSTOM PROCEDURE TRAY) ×3 IMPLANT
NEEDLE HYPO 22GX1.5 SAFETY (NEEDLE) ×3 IMPLANT
NS IRRIG 1000ML POUR BTL (IV SOLUTION) ×3 IMPLANT
PACK GENERAL/GYN (CUSTOM PROCEDURE TRAY) ×3 IMPLANT
SPONGE GAUZE 4X4 12PLY (GAUZE/BANDAGES/DRESSINGS) ×3 IMPLANT
STAPLER VISISTAT 35W (STAPLE) IMPLANT
SUT MNCRL AB 4-0 PS2 18 (SUTURE) ×3 IMPLANT
SUT NOVA NAB DX-16 0-1 5-0 T12 (SUTURE) ×6 IMPLANT
SUT VIC AB 3-0 SH 27 (SUTURE) ×3
SUT VIC AB 3-0 SH 27X BRD (SUTURE) ×2 IMPLANT
SUT VICRYL 2 0 18  UND BR (SUTURE)
SUT VICRYL 2 0 18 UND BR (SUTURE) IMPLANT
SYR CONTROL 10ML LL (SYRINGE) ×3 IMPLANT
TAPE CLOTH SURG 4X10 WHT LF (GAUZE/BANDAGES/DRESSINGS) ×3 IMPLANT
TOWEL OR 17X26 10 PK STRL BLUE (TOWEL DISPOSABLE) ×3 IMPLANT
TRAY FOLEY CATH 14FRSI W/METER (CATHETERS) IMPLANT

## 2012-09-25 NOTE — Addendum Note (Signed)
Addendum created 09/25/12 1327 by Hulan Fess, CRNA   Modules edited: Charges VN

## 2012-09-25 NOTE — Anesthesia Procedure Notes (Signed)
Procedure Name: LMA Insertion Date/Time: 09/25/2012 10:31 AM Performed by: Hulan Fess Pre-anesthesia Checklist: Patient identified, Emergency Drugs available, Suction available, Patient being monitored and Timeout performed Patient Re-evaluated:Patient Re-evaluated prior to inductionOxygen Delivery Method: Circle system utilized Preoxygenation: Pre-oxygenation with 100% oxygen Intubation Type: IV induction LMA: LMA with gastric port inserted LMA Size: 4.0

## 2012-09-25 NOTE — Anesthesia Postprocedure Evaluation (Signed)
Anesthesia Post Note  Patient: Rachael Johnson  Procedure(s) Performed: Procedure(s) (LRB): HERNIA REPAIR UMBILICAL ADULT (N/A)  Anesthesia type: General  Patient location: PACU  Post pain: Pain level controlled  Post assessment: Post-op Vital signs reviewed  Last Vitals: BP 128/52  Pulse 72  Temp(Src) 36.4 C  Resp 14  SpO2 95%  LMP 07/31/1990  Post vital signs: Reviewed  Level of consciousness: sedated  Complications: No apparent anesthesia complications

## 2012-09-25 NOTE — Op Note (Signed)
HERNIA REPAIR UMBILICAL ADULT  Procedure Note  KAMAURI KATHOL 09/25/2012   Pre-op Diagnosis: umbilical hernia     Post-op Diagnosis: sam3  Procedure(s): HERNIA REPAIR UMBILICAL ADULT  Surgeon(s): Shelly Rubenstein, MD  Anesthesia: General  Staff:  Circulator: Roosvelt Harps, RN; Duard Larsen, RN Scrub Person: April C McKinney, CST; Eiliana Portell, CST  Estimated Blood Loss: Minimal               Procedure: The patient was brought to the operating room and identified as the correct patient. She was placed supine on the operating room table and general anesthesia was induced. Her abdomen was then prepped and draped in the usual sterile fashion. I anesthetized the skin with Marcaine just below the umbilicus through her previous scar. I then made a small transverse incision incorporating the scar with the scalpel. I took this down to the fascia with electrocautery. The patient had a very small fascial defect containing incarcerated omentum. The defect itself was approximately 3-4 mm in size. I excised the incarcerated omentum and then closed the fascial defect with a figure-of-eight #1 Novafil suture. I elected not to use mesh because of the tiny size of the hernia defect. I anesthetized the fascia further with Marcaine. I tacked the umbilicus back in place with a 3-0 Vicryl suture. I then closed the subcutaneous tissue with interrupted 3-0 Vicryl sutures and closed the skin with a running 4-0 Monocryl suture. Steri-Strips, gauze, and Tegaderm were then applied. The patient tolerated the procedure well. All the counts were correct at the end of the procedure. The patient was then extubated in the operating room and taken in a stable condition to the recovery room.          Belinda Bringhurst A   Date: 09/25/2012  Time: 10:53 AM

## 2012-09-25 NOTE — Transfer of Care (Signed)
Immediate Anesthesia Transfer of Care Note  Patient: Rachael Johnson  Procedure(s) Performed: Procedure(s): HERNIA REPAIR UMBILICAL ADULT (N/A)  Patient Location: PACU  Anesthesia Type:General  Level of Consciousness: sedated  Airway & Oxygen Therapy: Patient Spontanous Breathing and Patient connected to face mask oxygen  Post-op Assessment: Report given to PACU RN  Post vital signs: Reviewed and stable  Complications: No apparent anesthesia complications

## 2012-09-25 NOTE — Anesthesia Preprocedure Evaluation (Addendum)
Anesthesia Evaluation  Patient identified by MRN, date of birth, ID band Patient awake    Reviewed: Allergy & Precautions, H&P , NPO status , Patient's Chart, lab work & pertinent test results  Airway Mallampati: II TM Distance: >3 FB Neck ROM: Full    Dental  (+) Dental Advisory Given, Teeth Intact and Chipped   Pulmonary neg pulmonary ROS,  breath sounds clear to auscultation  Pulmonary exam normal       Cardiovascular hypertension, Pt. on medications Rhythm:Regular Rate:Normal     Neuro/Psych  Headaches, Seizures -, Well Controlled,  PSYCHIATRIC DISORDERS Depression    GI/Hepatic Neg liver ROS, GERD-  Medicated,  Endo/Other  Hypothyroidism   Renal/GU negative Renal ROS     Musculoskeletal negative musculoskeletal ROS (+)   Abdominal   Peds  Hematology negative hematology ROS (+)   Anesthesia Other Findings   Reproductive/Obstetrics negative OB ROS                          Anesthesia Physical Anesthesia Plan  ASA: II  Anesthesia Plan: General   Post-op Pain Management:    Induction: Intravenous  Airway Management Planned: Oral ETT  Additional Equipment:   Intra-op Plan:   Post-operative Plan: Extubation in OR  Informed Consent: I have reviewed the patients History and Physical, chart, labs and discussed the procedure including the risks, benefits and alternatives for the proposed anesthesia with the patient or authorized representative who has indicated his/her understanding and acceptance.   Dental advisory given  Plan Discussed with: CRNA  Anesthesia Plan Comments:         Anesthesia Quick Evaluation

## 2012-09-25 NOTE — Interval H&P Note (Signed)
History and Physical Interval Note: no change in H and P  09/25/2012 8:30 AM  Rachael Johnson  has presented today for surgery, with the diagnosis of umbilical hernia  The various methods of treatment have been discussed with the patient and family. After consideration of risks, benefits and other options for treatment, the patient has consented to  Procedure(s) with comments: HERNIA REPAIR UMBILICAL ADULT (N/A) INSERTION OF MESH (N/A) - Possible Mesh as a surgical intervention .  The patient's history has been reviewed, patient examined, no change in status, stable for surgery.  I have reviewed the patient's chart and labs.  Questions were answered to the patient's satisfaction.     Skyann Ganim A

## 2012-09-26 ENCOUNTER — Telehealth: Payer: Self-pay | Admitting: Internal Medicine

## 2012-09-26 ENCOUNTER — Encounter (HOSPITAL_COMMUNITY): Payer: Self-pay | Admitting: Surgery

## 2012-09-26 MED ORDER — CITALOPRAM HYDROBROMIDE 40 MG PO TABS: 60.0000 mg | ORAL_TABLET | Freq: Every morning | ORAL | Status: DC

## 2012-09-26 NOTE — Telephone Encounter (Signed)
RX sent

## 2012-09-26 NOTE — Telephone Encounter (Signed)
Refill citlopram 40 mg

## 2012-10-04 ENCOUNTER — Other Ambulatory Visit: Payer: Self-pay | Admitting: Internal Medicine

## 2012-10-14 ENCOUNTER — Ambulatory Visit (INDEPENDENT_AMBULATORY_CARE_PROVIDER_SITE_OTHER): Payer: BC Managed Care – PPO | Admitting: Family Medicine

## 2012-10-14 ENCOUNTER — Encounter: Payer: Self-pay | Admitting: Family Medicine

## 2012-10-14 VITALS — BP 112/70 | HR 88 | Temp 97.9°F | Ht 63.5 in | Wt 122.0 lb

## 2012-10-14 DIAGNOSIS — K219 Gastro-esophageal reflux disease without esophagitis: Secondary | ICD-10-CM

## 2012-10-14 DIAGNOSIS — F32A Depression, unspecified: Secondary | ICD-10-CM

## 2012-10-14 DIAGNOSIS — F341 Dysthymic disorder: Secondary | ICD-10-CM

## 2012-10-14 DIAGNOSIS — F418 Other specified anxiety disorders: Secondary | ICD-10-CM

## 2012-10-14 DIAGNOSIS — K429 Umbilical hernia without obstruction or gangrene: Secondary | ICD-10-CM

## 2012-10-14 DIAGNOSIS — F329 Major depressive disorder, single episode, unspecified: Secondary | ICD-10-CM

## 2012-10-14 DIAGNOSIS — R1013 Epigastric pain: Secondary | ICD-10-CM

## 2012-10-14 DIAGNOSIS — D509 Iron deficiency anemia, unspecified: Secondary | ICD-10-CM

## 2012-10-14 LAB — CBC
HCT: 34.6 % — ABNORMAL LOW (ref 36.0–46.0)
Hemoglobin: 11.8 g/dL — ABNORMAL LOW (ref 12.0–15.0)
MCH: 37.9 pg — ABNORMAL HIGH (ref 26.0–34.0)
MCHC: 34.1 g/dL (ref 30.0–36.0)
MCV: 111.3 fL — ABNORMAL HIGH (ref 78.0–100.0)
Platelets: 520 10*3/uL — ABNORMAL HIGH (ref 150–400)
RBC: 3.11 MIL/uL — ABNORMAL LOW (ref 3.87–5.11)
RDW: 14.4 % (ref 11.5–15.5)
WBC: 4.2 10*3/uL (ref 4.0–10.5)

## 2012-10-14 MED ORDER — ALPRAZOLAM 1 MG PO TABS: 1.0000 mg | ORAL_TABLET | Freq: Three times a day (TID) | ORAL | Status: DC | PRN

## 2012-10-14 MED ORDER — ESCITALOPRAM OXALATE 20 MG PO TABS: 20.0000 mg | ORAL_TABLET | Freq: Two times a day (BID) | ORAL | Status: DC

## 2012-10-14 MED ORDER — RANITIDINE HCL 300 MG PO TABS: 300.0000 mg | ORAL_TABLET | Freq: Every evening | ORAL | Status: DC | PRN

## 2012-10-14 NOTE — Patient Instructions (Addendum)
Probiotics daily such as Align  Gastroesophageal Reflux Disease, Adult Gastroesophageal reflux disease (GERD) happens when acid from your stomach flows up into the esophagus. When acid comes in contact with the esophagus, the acid causes soreness (inflammation) in the esophagus. Over time, GERD may create small holes (ulcers) in the lining of the esophagus. CAUSES   Increased body weight. This puts pressure on the stomach, making acid rise from the stomach into the esophagus.  Smoking. This increases acid production in the stomach.  Drinking alcohol. This causes decreased pressure in the lower esophageal sphincter (valve or ring of muscle between the esophagus and stomach), allowing acid from the stomach into the esophagus.  Late evening meals and a full stomach. This increases pressure and acid production in the stomach.  A malformed lower esophageal sphincter. Sometimes, no cause is found. SYMPTOMS   Burning pain in the lower part of the mid-chest behind the breastbone and in the mid-stomach area. This may occur twice a week or more often.  Trouble swallowing.  Sore throat.  Dry cough.  Asthma-like symptoms including chest tightness, shortness of breath, or wheezing. DIAGNOSIS  Your caregiver may be able to diagnose GERD based on your symptoms. In some cases, X-rays and other tests may be done to check for complications or to check the condition of your stomach and esophagus. TREATMENT  Your caregiver may recommend over-the-counter or prescription medicines to help decrease acid production. Ask your caregiver before starting or adding any new medicines.  HOME CARE INSTRUCTIONS   Change the factors that you can control. Ask your caregiver for guidance concerning weight loss, quitting smoking, and alcohol consumption.  Avoid foods and drinks that make your symptoms worse, such as:  Caffeine or alcoholic drinks.  Chocolate.  Peppermint or mint flavorings.  Garlic and  onions.  Spicy foods.  Citrus fruits, such as oranges, lemons, or limes.  Tomato-based foods such as sauce, chili, salsa, and pizza.  Fried and fatty foods.  Avoid lying down for the 3 hours prior to your bedtime or prior to taking a nap.  Eat small, frequent meals instead of large meals.  Wear loose-fitting clothing. Do not wear anything tight around your waist that causes pressure on your stomach.  Raise the head of your bed 6 to 8 inches with wood blocks to help you sleep. Extra pillows will not help.  Only take over-the-counter or prescription medicines for pain, discomfort, or fever as directed by your caregiver.  Do not take aspirin, ibuprofen, or other nonsteroidal anti-inflammatory drugs (NSAIDs). SEEK IMMEDIATE MEDICAL CARE IF:   You have pain in your arms, neck, jaw, teeth, or back.  Your pain increases or changes in intensity or duration.  You develop nausea, vomiting, or sweating (diaphoresis).  You develop shortness of breath, or you faint.  Your vomit is green, yellow, black, or looks like coffee grounds or blood.  Your stool is red, bloody, or black. These symptoms could be signs of other problems, such as heart disease, gastric bleeding, or esophageal bleeding. MAKE SURE YOU:   Understand these instructions.  Will watch your condition.  Will get help right away if you are not doing well or get worse. Document Released: 04/26/2005 Document Revised: 10/09/2011 Document Reviewed: 02/03/2011 [facility] Patient Information 2013 [place], [place].

## 2012-10-14 NOTE — Assessment & Plan Note (Signed)
Mild after surgery, increase iron in diet. No changes otherwise

## 2012-10-14 NOTE — Assessment & Plan Note (Addendum)
Is recovering from surgical repair, did well with surgery and has follow up with surgeon in 2 weeks. Has had some mild constipation post op, encouraged probiotics and may continue to use Dulcolax prn

## 2012-10-14 NOTE — Progress Notes (Signed)
Patient ID: Rachael Johnson, female   DOB: 08-25-1950, 62 y.o.   MRN: [id number] GIOVANA FACIANE [id number] [birth year] 10/14/2012      Progress Note-Follow Up  Subjective  Chief Complaint  Chief Complaint  Patient presents with  . Follow-up    2 month    HPI  Patient is a 62 year old Caucasian female who is in today for followup. She has been seen by surgery and actually undergone inguinal hernia repair since her last visit here in she is healing well but does not she's had some multiple constipation. Dulcolax daily. To be solving this problem. No bloody or tarry stool. No nausea, vomiting, fevers, anorexia. She continues to struggle with anxiety and depression and the more time she's been on... he she is interested in changing medications today. Denies suicidal ideation but has low mood and ongoing anxiety attacks  Past Medical History  Diagnosis Date  . Arthritis   . Allergy   . Thyroid disease   . Diverticulitis   . Frequent episodic tension-type headache   . Hypertension   . Seizure     childhood  . Anemia, iron deficiency 07/08/2012  . Depression with anxiety 03/24/2011  . Depression   . GERD (gastroesophageal reflux disease)   . Essential thrombocythemia 01/24/2011    Past Surgical History  Procedure Laterality Date  . Breast surgery  1992    biopsy, benign. Fibrocystic  . Abdominal hysterectomy      1992  . Umbilical hernia repair N/A 09/25/2012    Procedure: HERNIA REPAIR UMBILICAL ADULT;  Surgeon: Shelly Rubenstein, MD;  Location: [facility];  Service: General;  Laterality: N/A;    Family History  Problem Relation Age of Onset  . Arthritis Mother   . Cancer Mother     ovarian  . Hypertension Mother   . Heart disease Mother   . Heart failure Mother   . Arthritis Father   . Cancer Father     lung  . Hypertension Father   . Cancer Sister     lung  . Cancer Brother     lung  . Cancer Brother     prostate    History   Social History  . Marital Status:  Widowed    Spouse Name: N/A    Number of Children: 2  . Years of Education: N/A   Occupational History  . APL operator     [employer]   Social History Main Topics  . Smoking status: Former Smoker -- 3.50 packs/day for 5 years    Types: Cigarettes    Quit date: 07/31/1990  . Smokeless tobacco: Never Used  . Alcohol Use: No  . Drug Use: No  . Sexually Active: No   Other Topics Concern  . Not on file   Social History Narrative   Regular exercise: no   Caffeine Use: 2-3 weekly    Current Outpatient Prescriptions on File Prior to Visit  Medication Sig Dispense Refill  . acetaminophen-codeine (TYLENOL #3) 300-30 MG per tablet Take 1 tablet by mouth 3 (three) times daily as needed for pain.      Marland Kitchen aspirin EC 81 MG tablet Take 81 mg by mouth every morning.      . ergocalciferol (VITAMIN D2) 50000 UNITS capsule Take 50,000 Units by mouth every 14 (fourteen) days. She takes on Monday every other week.      . estradiol (ESTRACE) 0.5 MG tablet Take 0.5 mg by mouth every morning.      Marland Kitchen  fluorometholone (FML) 0.1 % ophthalmic suspension Place 1 drop into both eyes daily as needed (For dry eyes.).       Marland Kitchen furosemide (LASIX) 20 MG tablet Take 20 mg by mouth every morning.      Marland Kitchen HYDROcodone-acetaminophen (NORCO) 5-325 MG per tablet Take 1-2 tablets by mouth every 4 (four) hours as needed for pain.  30 tablet  1  . hydroxyurea (HYDREA) 500 MG capsule Take 500 mg by mouth every morning.      Marland Kitchen levothyroxine (SYNTHROID, LEVOTHROID) 88 MCG tablet Take 88 mcg by mouth every morning.      . Multiple Vitamin (MULTIVITAMIN PO) Take 1 tablet by mouth every morning.       . naproxen (NAPROSYN) 500 MG tablet Take 500 mg by mouth 2 (two) times daily.      Marland Kitchen omeprazole (PRILOSEC) 40 MG capsule TAKE ONE CAPSULE BY MOUTH ONE TIME DAILY  90 capsule  0  . prochlorperazine (COMPAZINE) 10 MG tablet Take 10 mg by mouth every 8 (eight) hours as needed (For nausea.).       . SF 1.1 % GEL dental gel Place 1  application onto teeth daily as needed (For dental caries prophylaxis.).        No current facility-administered medications on file prior to visit.    No Known Allergies  Review of Systems  Review of Systems  Constitutional: Negative for fever and malaise/fatigue.  HENT: Negative for congestion.   Eyes: Negative for discharge.  Respiratory: Negative for shortness of breath.   Cardiovascular: Negative for chest pain, palpitations and leg swelling.  Gastrointestinal: Positive for constipation. Negative for nausea, abdominal pain and diarrhea.  Genitourinary: Negative for dysuria.  Musculoskeletal: Negative for falls.  Skin: Negative for rash.  Neurological: Negative for loss of consciousness and headaches.  Endo/Heme/Allergies: Negative for polydipsia.  Psychiatric/Behavioral: Negative for depression and suicidal ideas. The patient is not nervous/anxious and does not have insomnia.     Objective  BP 112/70  Pulse 88  Temp(Src) 97.9 F (36.6 C) (Oral)  Ht 5' 3.5" (1.613 m)  Wt 122 lb (55.339 kg)  BMI 21.27 kg/m2  SpO2 96%  LMP 07/31/1990  Physical Exam  Physical Exam  Constitutional: She is oriented to person, place, and time and well-developed, well-nourished, and in no distress. No distress.  HENT:  Head: Normocephalic and atraumatic.  Eyes: Conjunctivae are normal.  Neck: Neck supple. No thyromegaly present.  Cardiovascular: Normal rate, regular rhythm and normal heart sounds.   No murmur heard. Pulmonary/Chest: Effort normal and breath sounds normal. She has no wheezes.  Abdominal: Soft. Bowel sounds are normal. She exhibits no distension and no mass. There is no tenderness. There is no rebound and no guarding.  Small lesion, scabbed in umbilicus, no erythema or fluctuance  Musculoskeletal: She exhibits no edema.  Lymphadenopathy:    She has no cervical adenopathy.  Neurological: She is alert and oriented to person, place, and time.  Skin: Skin is warm and dry.  No rash noted. She is not diaphoretic.  Psychiatric: Memory, affect and judgment normal.    Lab Results  Component Value Date   TSH 4.296 08/14/2012   Lab Results  Component Value Date   WBC 4.2 10/14/2012   HGB 11.8* 10/14/2012   HCT 34.6* 10/14/2012   MCV 111.3* 10/14/2012   PLT 520* 10/14/2012   Lab Results  Component Value Date   CREATININE 0.66 09/19/2012   BUN 13 09/19/2012   NA 137 09/19/2012  K 3.9 09/19/2012   CL 102 09/19/2012   CO2 29 09/19/2012   Lab Results  Component Value Date   ALT 17 09/19/2012   AST 23 09/19/2012   ALKPHOS 52 09/19/2012   BILITOT 0.3 09/19/2012   Lab Results  Component Value Date   CHOL 194 09/18/2011   Lab Results  Component Value Date   HDL 63 09/18/2011   Lab Results  Component Value Date   LDLCALC 122* 09/18/2011   Lab Results  Component Value Date   TRIG 47 09/18/2011   Lab Results  Component Value Date   CHOLHDL 3.1 09/18/2011     Assessment & Plan  Umbilical hernia Is recovering from surgical repair, did well with surgery and has follow up with surgeon in 2 weeks. Has had some mild constipation post op, encouraged probiotics and may continue to use Dulcolax prn  Anemia, iron deficiency Mild after surgery, increase iron in diet. No changes otherwise  Depression with anxiety switch from citalopram to Escitalopram, 20 mg bid and increase Alprazolam 1mg  po tid prn anxiety call if any cocners c

## 2012-10-14 NOTE — Assessment & Plan Note (Signed)
switch from citalopram to Escitalopram, 20 mg bid and increase Alprazolam 1mg  po tid prn anxiety call if any cocners c

## 2012-10-15 ENCOUNTER — Encounter (INDEPENDENT_AMBULATORY_CARE_PROVIDER_SITE_OTHER): Payer: BC Managed Care – PPO | Admitting: Surgery

## 2012-10-15 LAB — H. PYLORI ANTIBODY, IGG: H Pylori IgG: 1.32 {ISR} — ABNORMAL HIGH

## 2012-10-15 MED ORDER — AMOXICILLIN 500 MG PO TABS: 1000.0000 mg | ORAL_TABLET | Freq: Two times a day (BID) | ORAL | Status: DC

## 2012-10-15 MED ORDER — CLARITHROMYCIN 500 MG PO TABS: 500.0000 mg | ORAL_TABLET | Freq: Two times a day (BID) | ORAL | Status: DC

## 2012-10-15 MED ORDER — OMEPRAZOLE 20 MG PO CPDR: 20.0000 mg | DELAYED_RELEASE_CAPSULE | Freq: Two times a day (BID) | ORAL | Status: DC

## 2012-10-15 NOTE — Addendum Note (Signed)
Addended by: Court Joy on: 10/15/2012 05:09 PM   Modules accepted: Orders

## 2012-10-16 NOTE — Progress Notes (Signed)
Quick Note:  Patient Informed and voiced understanding ______ 

## 2012-10-22 ENCOUNTER — Ambulatory Visit (INDEPENDENT_AMBULATORY_CARE_PROVIDER_SITE_OTHER): Payer: BC Managed Care – PPO | Admitting: Surgery

## 2012-10-22 ENCOUNTER — Encounter (INDEPENDENT_AMBULATORY_CARE_PROVIDER_SITE_OTHER): Payer: Self-pay | Admitting: Surgery

## 2012-10-22 VITALS — BP 126/74 | HR 70 | Temp 98.0°F | Resp 18 | Ht 64.0 in | Wt 119.0 lb

## 2012-10-22 DIAGNOSIS — Z09 Encounter for follow-up examination after completed treatment for conditions other than malignant neoplasm: Secondary | ICD-10-CM

## 2012-10-22 NOTE — Progress Notes (Signed)
Subjective:     Patient ID: Rachael Johnson, female   DOB: Jan 11, 1951, 62 y.o.   MRN: [id number]  HPI She is here for her first postop visit status post umbilical hernia repair. She is doing well and has no complaints.  Review of Systems     Objective:   Physical Exam On exam, her incision is well-healed without evidence of infection    Assessment:     Patient stable postop     Plan:     She now returned to normal activity on April 1. I will see her back as needed

## 2012-10-25 ENCOUNTER — Other Ambulatory Visit: Payer: Self-pay | Admitting: *Deleted

## 2012-10-25 DIAGNOSIS — D473 Essential (hemorrhagic) thrombocythemia: Secondary | ICD-10-CM

## 2012-10-25 MED ORDER — HYDROXYUREA 500 MG PO CAPS: 500.0000 mg | ORAL_CAPSULE | Freq: Every day | ORAL | Status: DC

## 2012-11-07 ENCOUNTER — Other Ambulatory Visit: Payer: Self-pay | Admitting: Hematology & Oncology

## 2012-11-13 ENCOUNTER — Other Ambulatory Visit: Payer: Self-pay | Admitting: Internal Medicine

## 2012-11-13 NOTE — Telephone Encounter (Signed)
Rx request to pharmacy/[facility]  

## 2012-11-18 ENCOUNTER — Other Ambulatory Visit: Payer: Self-pay | Admitting: *Deleted

## 2012-11-18 DIAGNOSIS — D473 Essential (hemorrhagic) thrombocythemia: Secondary | ICD-10-CM

## 2012-11-18 MED ORDER — HYDROXYUREA 500 MG PO CAPS: 500.0000 mg | ORAL_CAPSULE | Freq: Every day | ORAL | Status: DC

## 2012-11-29 ENCOUNTER — Ambulatory Visit (HOSPITAL_BASED_OUTPATIENT_CLINIC_OR_DEPARTMENT_OTHER): Payer: BC Managed Care – PPO | Admitting: Hematology & Oncology

## 2012-11-29 ENCOUNTER — Other Ambulatory Visit (HOSPITAL_BASED_OUTPATIENT_CLINIC_OR_DEPARTMENT_OTHER): Payer: BC Managed Care – PPO | Admitting: Lab

## 2012-11-29 VITALS — BP 100/61 | HR 66 | Temp 98.0°F | Resp 16 | Ht 64.0 in | Wt 118.0 lb

## 2012-11-29 DIAGNOSIS — D473 Essential (hemorrhagic) thrombocythemia: Secondary | ICD-10-CM

## 2012-11-29 DIAGNOSIS — D509 Iron deficiency anemia, unspecified: Secondary | ICD-10-CM

## 2012-11-29 LAB — CBC WITH DIFFERENTIAL (CANCER CENTER ONLY)
BASO#: 0 10*3/uL (ref 0.0–0.2)
BASO%: 0.5 % (ref 0.0–2.0)
EOS%: 3.8 % (ref 0.0–7.0)
Eosinophils Absolute: 0.2 10*3/uL (ref 0.0–0.5)
HCT: 34.7 % — ABNORMAL LOW (ref 34.8–46.6)
HGB: 11.6 g/dL (ref 11.6–15.9)
LYMPH#: 1.5 10*3/uL (ref 0.9–3.3)
LYMPH%: 37.6 % (ref 14.0–48.0)
MCH: 40.1 pg — ABNORMAL HIGH (ref 26.0–34.0)
MCHC: 33.4 g/dL (ref 32.0–36.0)
MCV: 120 fL — ABNORMAL HIGH (ref 81–101)
MONO#: 0.4 10*3/uL (ref 0.1–0.9)
MONO%: 9 % (ref 0.0–13.0)
NEUT#: 2 10*3/uL (ref 1.5–6.5)
NEUT%: 49.1 % (ref 39.6–80.0)
Platelets: 486 10*3/uL — ABNORMAL HIGH (ref 145–400)
RBC: 2.89 10*6/uL — ABNORMAL LOW (ref 3.70–5.32)
RDW: 12.9 % (ref 11.1–15.7)
WBC: 4 10*3/uL (ref 3.9–10.0)

## 2012-11-29 LAB — FERRITIN: Ferritin: 343 ng/mL — ABNORMAL HIGH (ref 10–291)

## 2012-11-29 LAB — IRON AND TIBC
%SAT: 26 % (ref 20–55)
Iron: 58 ug/dL (ref 42–145)
TIBC: 223 ug/dL — ABNORMAL LOW (ref 250–470)
UIBC: 165 ug/dL (ref 125–400)

## 2012-11-29 LAB — CHCC SATELLITE - SMEAR

## 2012-11-29 MED ORDER — HYDROXYUREA 500 MG PO CAPS: 500.0000 mg | ORAL_CAPSULE | Freq: Every day | ORAL | Status: DC

## 2012-11-29 NOTE — Progress Notes (Signed)
This office note has been dictated.

## 2012-11-30 NOTE — Progress Notes (Signed)
CC:   Rachael Edge, MD  DIAGNOSES: 1. Essential thrombocythemia - JAK2 negative. 2. Intermittent iron deficiency anemia.  CURRENT THERAPY: 1. Hydrea 500 mg p.o. daily. 2. IV iron as needed.  INTERIM HISTORY:  Rachael Johnson comes in for followup.  She is doing okay. She has had no specific problems since we last saw her.  She actually did have hernia surgery.  This was done by Dr. Magnus Ivan.  This was done back in February.  She has been back to work now for the past 3 weeks. She has had no problems with bleeding or bruising.  Her appetite still is marginal.  She still misses her husband quite a bit.  He died 2 years ago in 2023/01/10.  It has been incredibly tough on her without him.  Her new dog, who turned 21 year old, is helping her out.  She has had no headache.  She has had no double vision or blurred vision.  There has been no pain or burning in her hands or feet.  PHYSICAL EXAMINATION:  General:  This is a thin white female in no obvious distress.  Vital signs:  Temperature 98, pulse 66, respiratory rate 16, blood pressure 100/61.  Weight is 118.  Head and neck: Normocephalic, atraumatic skull.  There are no ocular or oral lesions. There are no palpable cervical or supraclavicular lymph nodes.  Lungs: Clear bilaterally.  Cardiac:  Regular rate and rhythm with a normal S1, S2.  There are no murmurs, rubs or bruits.  Abdomen:  Soft with good bowel sounds.  There is no palpable abdominal mass.  There is no fluid wave.  There is no palpable hepatosplenomegaly.  Extremities:  Show no clubbing, cyanosis or edema.  Neurological:  Shows no focal neurological deficits.  LABORATORY STUDIES:  White cell count is 4, hemoglobin 11.6, hematocrit 34.7, platelet count 486.  MCV is 120.  IMPRESSION:  Rachael Johnson is a very nice 62 year old white female with essential thrombocythemia.  Thankfully, she has had no issues with this to date.  For now, we will follow her up in another 3 months.  Of  note, her last IV iron was given back in December of 2013.    ______________________________ Josph Macho, M.D. PRE/MEDQ  D:  11/29/2012  T:  11/30/2012  Job:  [id number]

## 2012-12-05 ENCOUNTER — Telehealth: Payer: Self-pay | Admitting: *Deleted

## 2012-12-05 NOTE — Telephone Encounter (Signed)
Message copied by Anselm Jungling on Thu Dec 05, 2012 11:21 AM ------      Message from: Josph Macho      Created: Fri Nov 29, 2012  6:40 PM       Call - iron is ok!!  Cindee Lame ------

## 2012-12-05 NOTE — Telephone Encounter (Signed)
Called patient to let her know that her iron levels are good per. Dr Myna Hidalgo

## 2012-12-06 ENCOUNTER — Other Ambulatory Visit: Payer: Self-pay

## 2012-12-06 ENCOUNTER — Telehealth: Payer: Self-pay | Admitting: Hematology & Oncology

## 2012-12-06 MED ORDER — ACETAMINOPHEN-CODEINE #3 300-30 MG PO TABS: 1.0000 | ORAL_TABLET | Freq: Three times a day (TID) | ORAL | Status: DC | PRN

## 2012-12-06 NOTE — Telephone Encounter (Signed)
Per pt request moved 8-15 to 8-8 left her message

## 2012-12-06 NOTE — Telephone Encounter (Signed)
Please advise refill? Last RX was done on 07-03-12  If ok fax to [phone]

## 2012-12-11 ENCOUNTER — Other Ambulatory Visit: Payer: Self-pay | Admitting: Internal Medicine

## 2012-12-12 ENCOUNTER — Other Ambulatory Visit: Payer: Self-pay | Admitting: *Deleted

## 2012-12-12 DIAGNOSIS — D473 Essential (hemorrhagic) thrombocythemia: Secondary | ICD-10-CM

## 2012-12-12 MED ORDER — HYDROXYUREA 500 MG PO CAPS: 500.0000 mg | ORAL_CAPSULE | Freq: Every day | ORAL | Status: DC

## 2012-12-12 NOTE — Telephone Encounter (Signed)
Received refill request from [facility] for Hydrea 500 mg QD. This is a long term med for the pt. Last saw Dr Myna Hidalgo on 11-29-12. Sent via e-rx.

## 2012-12-19 ENCOUNTER — Encounter: Payer: Self-pay | Admitting: Family Medicine

## 2012-12-19 ENCOUNTER — Ambulatory Visit (INDEPENDENT_AMBULATORY_CARE_PROVIDER_SITE_OTHER): Payer: BC Managed Care – PPO | Admitting: Family Medicine

## 2012-12-19 ENCOUNTER — Telehealth: Payer: Self-pay

## 2012-12-19 VITALS — BP 126/64 | HR 75 | Temp 98.4°F | Ht 63.5 in | Wt 119.0 lb

## 2012-12-19 DIAGNOSIS — F329 Major depressive disorder, single episode, unspecified: Secondary | ICD-10-CM

## 2012-12-19 DIAGNOSIS — K429 Umbilical hernia without obstruction or gangrene: Secondary | ICD-10-CM

## 2012-12-19 DIAGNOSIS — M1990 Unspecified osteoarthritis, unspecified site: Secondary | ICD-10-CM

## 2012-12-19 DIAGNOSIS — F341 Dysthymic disorder: Secondary | ICD-10-CM

## 2012-12-19 DIAGNOSIS — M129 Arthropathy, unspecified: Secondary | ICD-10-CM

## 2012-12-19 DIAGNOSIS — F418 Other specified anxiety disorders: Secondary | ICD-10-CM

## 2012-12-19 DIAGNOSIS — F32A Depression, unspecified: Secondary | ICD-10-CM

## 2012-12-19 DIAGNOSIS — A048 Other specified bacterial intestinal infections: Secondary | ICD-10-CM

## 2012-12-19 HISTORY — DX: Other specified bacterial intestinal infections: A04.8

## 2012-12-19 MED ORDER — ESTRADIOL 0.5 MG PO TABS: 0.5000 mg | ORAL_TABLET | Freq: Every day | ORAL | Status: DC

## 2012-12-19 MED ORDER — ALPRAZOLAM 1 MG PO TABS: 1.0000 mg | ORAL_TABLET | Freq: Three times a day (TID) | ORAL | Status: DC | PRN

## 2012-12-19 MED ORDER — VENLAFAXINE HCL ER 75 MG PO CP24: ORAL_CAPSULE | ORAL | Status: DC

## 2012-12-19 MED ORDER — ACETAMINOPHEN-CODEINE #3 300-30 MG PO TABS: 1.0000 | ORAL_TABLET | Freq: Three times a day (TID) | ORAL | Status: DC | PRN

## 2012-12-19 MED ORDER — FUROSEMIDE 20 MG PO TABS: 20.0000 mg | ORAL_TABLET | ORAL | Status: DC

## 2012-12-19 NOTE — Assessment & Plan Note (Signed)
Some improvement with Lexapro but not enough. Will try transitioning to Effexor. May continue alprazolam for now but wanted titrate back down in the future.

## 2012-12-19 NOTE — Assessment & Plan Note (Signed)
Has recovered nicely very minimal discomfort at this time.

## 2012-12-19 NOTE — Assessment & Plan Note (Signed)
Recently treated and feeling much better

## 2012-12-19 NOTE — Progress Notes (Signed)
Patient ID: Rachael Johnson, female   DOB: 03/29/1951, 62 y.o.   MRN: [id number] Rachael Johnson [id number] 25-Sep-1950 12/19/2012      Progress Note-Follow Up  Subjective  Chief Complaint  Chief Complaint  Patient presents with  . Follow-up    2 month    HPI  Patient is a 62 year old Caucasian female in today for followup. She's recovered well from her umbilical hernia repair surgery. He recently treated H. Pylori infection in her GI system is feeling much better. Less dyspepsia and reflux. No abdominal pain. Physically she feels better. No chest pain or palpitations but she does continue to struggle with low mood. To switch to Lexapro helped some but not adequately. No suicidal ideation but she does still struggle with anhedonia and irritability. No fevers or chills no recent systemic illness or other complaints are noted today  Past Medical History  Diagnosis Date  . Arthritis   . Allergy   . Thyroid disease   . Diverticulitis   . Frequent episodic tension-type headache   . Hypertension   . Seizure     childhood  . Anemia, iron deficiency 07/08/2012  . Depression with anxiety 03/24/2011  . Depression   . GERD (gastroesophageal reflux disease)   . Essential thrombocythemia 01/24/2011  . H. pylori infection 12/19/2012    Past Surgical History  Procedure Laterality Date  . Breast surgery  1992    biopsy, benign. Fibrocystic  . Abdominal hysterectomy      1992  . Umbilical hernia repair N/A 09/25/2012    Procedure: HERNIA REPAIR UMBILICAL ADULT;  Surgeon: Shelly Rubenstein, MD;  Location: [facility];  Service: General;  Laterality: N/A;    Family History  Problem Relation Age of Onset  . Arthritis Mother   . Cancer Mother     ovarian  . Hypertension Mother   . Heart disease Mother   . Heart failure Mother   . Arthritis Father   . Cancer Father     lung  . Hypertension Father   . Cancer Sister     lung  . Cancer Brother     lung  . Cancer Brother     prostate     History   Social History  . Marital Status: Widowed    Spouse Name: N/A    Number of Children: 2  . Years of Education: N/A   Occupational History  . APL operator     [employer]   Social History Main Topics  . Smoking status: Former Smoker -- 3.50 packs/day for 5 years    Types: Cigarettes    Quit date: 07/31/1990  . Smokeless tobacco: Never Used  . Alcohol Use: No  . Drug Use: No  . Sexually Active: No   Other Topics Concern  . Not on file   Social History Narrative   Regular exercise: no   Caffeine Use: 2-3 weekly    Current Outpatient Prescriptions on File Prior to Visit  Medication Sig Dispense Refill  . aspirin EC 81 MG tablet Take 81 mg by mouth every morning.      . fluorometholone (FML) 0.1 % ophthalmic suspension Place 1 drop into both eyes daily as needed (For dry eyes.).       Marland Kitchen hydroxyurea (HYDREA) 500 MG capsule Take 1 capsule (500 mg total) by mouth daily after breakfast.  90 capsule  3  . levothyroxine (SYNTHROID, LEVOTHROID) 88 MCG tablet Take 1 tablet (88 mcg total) by mouth daily before breakfast.  30 tablet  2  . Multiple Vitamin (MULTIVITAMIN PO) Take 1 tablet by mouth every morning.       . naproxen (NAPROSYN) 500 MG tablet TAKE ONE TABLET BY MOUTH TWICE A DAY WITH MEALS  60 tablet  1  . omeprazole (PRILOSEC) 40 MG capsule TAKE ONE CAPSULE BY MOUTH ONE TIME DAILY  90 capsule  0  . prochlorperazine (COMPAZINE) 10 MG tablet Take 10 mg by mouth every 8 (eight) hours as needed (For nausea.).       Marland Kitchen ranitidine (ZANTAC) 300 MG tablet Take 1 tablet (300 mg total) by mouth at bedtime as needed for heartburn.  30 tablet  5  . Vitamin D, Ergocalciferol, (DRISDOL) 50000 UNITS CAPS TAKE 1 CAPSULE BY MOUTH EVERY OTHER WEEK AS INSTRUCTED  6 capsule  3   No current facility-administered medications on file prior to visit.    No Known Allergies  Review of Systems  Review of Systems  Constitutional: Negative for fever and malaise/fatigue.  HENT: Negative  for congestion.   Eyes: Negative for discharge.  Respiratory: Negative for shortness of breath.   Cardiovascular: Negative for chest pain, palpitations and leg swelling.  Gastrointestinal: Positive for heartburn. Negative for nausea, abdominal pain and diarrhea.  Genitourinary: Negative for dysuria.  Musculoskeletal: Negative for falls.  Skin: Negative for rash.  Neurological: Negative for loss of consciousness and headaches.  Endo/Heme/Allergies: Negative for polydipsia.  Psychiatric/Behavioral: Positive for depression. Negative for suicidal ideas. The patient is nervous/anxious. The patient does not have insomnia.     Objective  BP 126/64  Pulse 75  Temp(Src) 98.4 F (36.9 C) (Oral)  Ht 5' 3.5" (1.613 m)  Wt 119 lb (53.978 kg)  BMI 20.75 kg/m2  SpO2 96%  LMP 07/31/1990  Physical Exam  Physical Exam  Constitutional: She is oriented to person, place, and time and well-developed, well-nourished, and in no distress. No distress.  HENT:  Head: Normocephalic and atraumatic.  Eyes: Conjunctivae are normal.  Neck: Neck supple. No thyromegaly present.  Cardiovascular: Normal rate, regular rhythm and normal heart sounds.   No murmur heard. Pulmonary/Chest: Effort normal and breath sounds normal. She has no wheezes.  Abdominal: She exhibits no distension and no mass.  Musculoskeletal: She exhibits no edema.  Lymphadenopathy:    She has no cervical adenopathy.  Neurological: She is alert and oriented to person, place, and time.  Skin: Skin is warm and dry. No rash noted. She is not diaphoretic.  Psychiatric: Memory, affect and judgment normal.    Lab Results  Component Value Date   TSH 4.296 08/14/2012   Lab Results  Component Value Date   WBC 4.0 11/29/2012   HGB 11.6 11/29/2012   HCT 34.7* 11/29/2012   MCV 120* 11/29/2012   PLT 486* 11/29/2012   Lab Results  Component Value Date   CREATININE 0.66 09/19/2012   BUN 13 09/19/2012   NA 137 09/19/2012   K 3.9 09/19/2012   CL 102  09/19/2012   CO2 29 09/19/2012   Lab Results  Component Value Date   ALT 17 09/19/2012   AST 23 09/19/2012   ALKPHOS 52 09/19/2012   BILITOT 0.3 09/19/2012   Lab Results  Component Value Date   CHOL 194 09/18/2011   Lab Results  Component Value Date   HDL 63 09/18/2011   Lab Results  Component Value Date   LDLCALC 122* 09/18/2011   Lab Results  Component Value Date   TRIG 47 09/18/2011   Lab Results  Component Value Date   CHOLHDL 3.1 09/18/2011     Assessment & Plan  Depression with anxiety Some improvement with Lexapro but not enough. Will try transitioning to Effexor. May continue alprazolam for now but wanted titrate back down in the future.  Umbilical hernia Has recovered nicely very minimal discomfort at this time.  H. pylori infection Recently treated and feeling much better

## 2012-12-19 NOTE — Telephone Encounter (Signed)
Per md pt needs 90 day supply of medication sent to mail order. I left a message for pt to return my call I need to know what needs to be sent due to a lot of medications have plenty of refills at [facility] or call and cancel [facility] refills?

## 2012-12-25 ENCOUNTER — Telehealth: Payer: Self-pay

## 2012-12-25 NOTE — Telephone Encounter (Signed)
Pt left a message stating that she started Venlafaxine 75 mg X 7 days then supposed to go to 2 tabs daily. Pt would like to know if she is supposed to continue the Lexapro or stop it?  Verbal per MD that pt is to stop Lexapro when the Venlafaxine goes to 2 tabs daily.  Message left at [phone] per pts request

## 2012-12-26 NOTE — Telephone Encounter (Signed)
Left a message for pt to return my call.  I'm closing note until pt returns my call

## 2013-01-04 ENCOUNTER — Other Ambulatory Visit: Payer: Self-pay | Admitting: Family Medicine

## 2013-01-04 ENCOUNTER — Other Ambulatory Visit: Payer: Self-pay | Admitting: Internal Medicine

## 2013-01-22 ENCOUNTER — Other Ambulatory Visit: Payer: Self-pay | Admitting: Family Medicine

## 2013-01-30 ENCOUNTER — Other Ambulatory Visit: Payer: Self-pay | Admitting: Hematology & Oncology

## 2013-02-05 ENCOUNTER — Other Ambulatory Visit: Payer: Self-pay | Admitting: Family Medicine

## 2013-02-06 NOTE — Telephone Encounter (Signed)
Rx request to pharmacy/[facility]  

## 2013-02-12 ENCOUNTER — Ambulatory Visit: Payer: BC Managed Care – PPO | Admitting: Family Medicine

## 2013-02-14 ENCOUNTER — Other Ambulatory Visit: Payer: Self-pay | Admitting: Gynecology

## 2013-02-20 ENCOUNTER — Other Ambulatory Visit: Payer: Self-pay | Admitting: Family Medicine

## 2013-02-21 ENCOUNTER — Other Ambulatory Visit: Payer: Self-pay

## 2013-02-21 ENCOUNTER — Ambulatory Visit (INDEPENDENT_AMBULATORY_CARE_PROVIDER_SITE_OTHER): Payer: BC Managed Care – PPO | Admitting: Family Medicine

## 2013-02-21 ENCOUNTER — Encounter: Payer: Self-pay | Admitting: Family Medicine

## 2013-02-21 VITALS — BP 110/78 | HR 62 | Temp 98.2°F | Ht 63.5 in | Wt 112.1 lb

## 2013-02-21 DIAGNOSIS — F418 Other specified anxiety disorders: Secondary | ICD-10-CM

## 2013-02-21 DIAGNOSIS — F341 Dysthymic disorder: Secondary | ICD-10-CM

## 2013-02-21 DIAGNOSIS — E785 Hyperlipidemia, unspecified: Secondary | ICD-10-CM

## 2013-02-21 DIAGNOSIS — E039 Hypothyroidism, unspecified: Secondary | ICD-10-CM

## 2013-02-21 DIAGNOSIS — F32A Depression, unspecified: Secondary | ICD-10-CM

## 2013-02-21 DIAGNOSIS — D509 Iron deficiency anemia, unspecified: Secondary | ICD-10-CM

## 2013-02-21 DIAGNOSIS — D649 Anemia, unspecified: Secondary | ICD-10-CM

## 2013-02-21 DIAGNOSIS — M1990 Unspecified osteoarthritis, unspecified site: Secondary | ICD-10-CM

## 2013-02-21 DIAGNOSIS — M129 Arthropathy, unspecified: Secondary | ICD-10-CM

## 2013-02-21 DIAGNOSIS — F329 Major depressive disorder, single episode, unspecified: Secondary | ICD-10-CM

## 2013-02-21 DIAGNOSIS — R634 Abnormal weight loss: Secondary | ICD-10-CM

## 2013-02-21 LAB — HEPATIC FUNCTION PANEL
ALT: 10 U/L (ref 0–35)
AST: 18 U/L (ref 0–37)
Albumin: 4 g/dL (ref 3.5–5.2)
Alkaline Phosphatase: 51 U/L (ref 39–117)
Bilirubin, Direct: 0.1 mg/dL (ref 0.0–0.3)
Indirect Bilirubin: 0.2 mg/dL (ref 0.0–0.9)
Total Bilirubin: 0.3 mg/dL (ref 0.3–1.2)
Total Protein: 6.4 g/dL (ref 6.0–8.3)

## 2013-02-21 LAB — CBC
HCT: 35.6 % — ABNORMAL LOW (ref 36.0–46.0)
Hemoglobin: 12.2 g/dL (ref 12.0–15.0)
MCH: 38.6 pg — ABNORMAL HIGH (ref 26.0–34.0)
MCHC: 34.3 g/dL (ref 30.0–36.0)
MCV: 112.7 fL — ABNORMAL HIGH (ref 78.0–100.0)
Platelets: 534 10*3/uL — ABNORMAL HIGH (ref 150–400)
RBC: 3.16 MIL/uL — ABNORMAL LOW (ref 3.87–5.11)
RDW: 12.7 % (ref 11.5–15.5)
WBC: 3.4 10*3/uL — ABNORMAL LOW (ref 4.0–10.5)

## 2013-02-21 LAB — RENAL FUNCTION PANEL
Albumin: 4 g/dL (ref 3.5–5.2)
BUN: 16 mg/dL (ref 6–23)
CO2: 28 mEq/L (ref 19–32)
Calcium: 9 mg/dL (ref 8.4–10.5)
Chloride: 104 mEq/L (ref 96–112)
Creat: 0.62 mg/dL (ref 0.50–1.10)
Glucose, Bld: 78 mg/dL (ref 70–99)
Phosphorus: 3.4 mg/dL (ref 2.3–4.6)
Potassium: 4.5 mEq/L (ref 3.5–5.3)
Sodium: 139 mEq/L (ref 135–145)

## 2013-02-21 LAB — IRON: Iron: 58 ug/dL (ref 42–145)

## 2013-02-21 LAB — T4, FREE: Free T4: 1.19 ng/dL (ref 0.80–1.80)

## 2013-02-21 LAB — IBC PANEL
%SAT: 26 % (ref 20–55)
TIBC: 225 ug/dL — ABNORMAL LOW (ref 250–470)
UIBC: 167 ug/dL (ref 125–400)

## 2013-02-21 LAB — TSH: TSH: 2.374 u[IU]/mL (ref 0.350–4.500)

## 2013-02-21 MED ORDER — ACETAMINOPHEN-CODEINE #3 300-30 MG PO TABS: 1.0000 | ORAL_TABLET | Freq: Three times a day (TID) | ORAL | Status: DC | PRN

## 2013-02-21 MED ORDER — ESTRADIOL 0.5 MG PO TABS: 0.5000 mg | ORAL_TABLET | Freq: Every day | ORAL | Status: DC

## 2013-02-21 MED ORDER — VENLAFAXINE HCL ER 150 MG PO CP24: 150.0000 mg | ORAL_CAPSULE | Freq: Every day | ORAL | Status: DC

## 2013-02-21 MED ORDER — FUROSEMIDE 20 MG PO TABS: 20.0000 mg | ORAL_TABLET | ORAL | Status: DC

## 2013-02-21 NOTE — Patient Instructions (Addendum)
Rel of Rec Dr Audie Box gyn  Next visit gyn  Add more protein to diet, consider Boost or Ensure, increase meats, nuts  Try Aspercreme or Salon Pas cream to the knees as needed  Thyroid Diseases Your thyroid is a butterfly-shaped gland in your neck. It is located just above your collarbone. It is one of your endocrine glands, which make hormones. The thyroid helps set your metabolism. Metabolism is how your body gets energy from the foods you eat.  Millions of people have thyroid diseases. Women experience thyroid problems more often than men. In fact, overactive thyroid problems (hyperthyroidism) occur in 1% of all women. If you have a thyroid disease, your body may use energy more slowly or quickly than it should.  Thyroid problems also include an immune disease where your body reacts against your thyroid gland (called thyroiditis). A different problem involves lumps and bumps (called nodules) that develop in the gland. The nodules are usually, but not always, noncancerous. THE MOST COMMON THYROID PROBLEMS AND CAUSES ARE DISCUSSED BELOW There are many causes for thyroid problems. Treatment depends upon the exact diagnosis and includes trying to reset your body's metabolism to a normal rate. Hyperthyroidism Too much thyroid hormone from an overactive thyroid gland is called hyperthyroidism. In hyperthyroidism, the body's metabolism speeds up. One of the most frequent forms of hyperthyroidism is known as Graves' disease. Graves' disease tends to run in families. Although Graves' is thought to be caused by a problem with the immune system, the exact nature of the genetic problem is unknown. Hypothyroidism Too little thyroid hormone from an underactive thyroid gland is called hypothyroidism. In hypothyroidism, the body's metabolism is slowed. Several things can cause this condition. Most causes affect the thyroid gland directly and hurt its ability to make enough hormone.  Rarely, there may be a  pituitary gland tumor (located near the base of the brain). The tumor can block the pituitary from producing thyroid-stimulating hormone (TSH). Your body makes TSH to stimulate the thyroid to work properly. If the pituitary does not make enough TSH, the thyroid fails to make enough hormones needed for good health. Whether the problem is caused by thyroid conditions or by the pituitary gland, the result is that the thyroid is not making enough hormones. Hypothyroidism causes many physical and mental processes to become sluggish. The body consumes less oxygen and produces less body heat. Thyroid Nodules A thyroid nodule is a small swelling or lump in the thyroid gland. They are common. These nodules represent either a growth of thyroid tissue or a fluid-filled cyst. Both form a lump in the thyroid gland. Almost half of all people will have tiny thyroid nodules at some point in their lives. Typically, these are not noticeable until they become large and affect normal thyroid size. Larger nodules that are greater than a half inch across (about 1 centimeter) occur in about 5 percent of people. Although most nodules are not cancerous, people who have them should seek medical care to rule out cancer. Also, some thyroid nodules may produce too much thyroid hormone or become too large. Large nodules or a large gland can interfere with breathing or swallowing or may cause neck discomfort. Other problems Other thyroid problems include cancer and thyroiditis. Thyroiditis is a malfunction of the body's immune system. Normally, the immune system works to defend the body against infection and other problems. When the immune system is not working properly, it may mistakenly attack normal cells, tissues, and organs. Examples of autoimmune diseases are  Hashimoto's thyroiditis (which causes low thyroid function) and Graves' disease (which causes excess thyroid function). SYMPTOMS  Symptoms vary greatly depending upon the exact  type of problem with the thyroid. Hyperthyroidism-is when your thyroid is too active and makes more thyroid hormone than your body needs. The most common cause is Graves' Disease. Too much thyroid hormone can cause some or all of the following symptoms:  Anxiety.  Irritability.  Difficulty sleeping.  Fatigue.  A rapid or irregular heartbeat.  A fine tremor of your hands or fingers.  An increase in perspiration.  Sensitivity to heat.  Weight loss, despite normal food intake.  Brittle hair.  Enlargement of your thyroid gland (goiter).  Light menstrual periods.  Frequent bowel movements. Graves' disease can specifically cause eye and skin problems. The skin problems involve reddening and swelling of the skin, often on your shins and on the top of your feet. Eye problems can include the following:  Excess tearing and sensation of grit or sand in either or both eyes.  Reddened or inflamed eyes.  Widening of the space between your eyelids.  Swelling of the lids and tissues around the eyes.  Light sensitivity.  Ulcers on the cornea.  Double vision.  Limited eye movements.  Blurred or reduced vision. Hypothyroidism- is when your thyroid gland is not active enough. This is more common than hyperthyroidism. Symptoms can vary a lot depending of the severity of the hormone deficiency. Symptoms may develop over a long period of time and can include several of the following:  Fatigue.  Sluggishness.  Increased sensitivity to cold.  Constipation.  Pale, dry skin.  A puffy face.  Hoarse voice.  High blood cholesterol level.  Unexplained weight gain.  Muscle aches, tenderness and stiffness.  Pain, stiffness or swelling in your joints.  Muscle weakness.  Heavier than normal menstrual periods.  Brittle fingernails and hair.  Depression. Thyroid Nodules - most do not cause signs or symptoms. Occasionally, some may become so large that you can feel or even see  the swelling at the base of your neck. You may realize a lump or swelling is there when you are shaving or putting on makeup. Men might become aware of a nodule when shirt collars suddenly feel too tight. Some nodules produce too much thyroid hormone. This can produce the same symptoms as hyperthyroidism (see above). Thyroid nodules are seldom cancerous. However, a nodule is more likely to be malignant (cancerous) if it:  Grows quickly or feels hard.  Causes you to become hoarse or to have trouble swallowing or breathing.  Causes enlarged lymph nodes under your jaw or in your neck. DIAGNOSIS  Because there are so many possible thyroid conditions, your caregiver may ask for a number of tests. They will do this in order to narrow down the exact diagnosis. These tests can include:  Blood and antibody tests.  Special thyroid scans using small, safe amounts of radioactive iodine.  Ultrasound of the thyroid gland (particularly if there is a nodule or lump).  Biopsy. This is usually done with a special needle. A needle biopsy is a procedure to obtain a sample of cells from the thyroid. The tissue will be tested in a lab and examined under a microscope. TREATMENT  Treatment depends on the exact diagnosis. Hyperthyroidism  Beta-blockers help relieve many of the symptoms.  Anti-thyroid medications prevent the thyroid from making excess hormones.  Radioactive iodine treatment can destroy overactive thyroid cells. The iodine can permanently decrease the amount of hormone  produced.  Surgery to remove the thyroid gland.  Treatments for eye problems that come from Graves' disease also include medications and special eye surgery, if felt to be appropriate. Hypothyroidism Thyroid replacement with levothyroxine is the mainstay of treatment. Treatment with thyroid replacement is usually lifelong and will require monitoring and adjustment from time to time. Thyroid Nodules  Watchful waiting. If a  small nodule causes no symptoms or signs of cancer on biopsy, then no treatment may be chosen at first. Re-exam and re-checking blood tests would be the recommended follow-up.  Anti-thyroid medications or radioactive iodine treatment may be recommended if the nodules produce too much thyroid hormone (see Treatment for Hyperthyroidism above).  Alcohol ablation. Injections of small amounts of ethyl alcohol (ethanol) can cause a non-cancerous nodule to shrink in size.  Surgery (see Treatment for Hyperthyroidism above). HOME CARE INSTRUCTIONS   Take medications as instructed.  Follow through on recommended testing. SEEK MEDICAL CARE IF:   You feel that you are developing symptoms of Hyperthyroidism or Hypothyroidism as described above.  You develop a new lump/nodule in the neck/thyroid area that you had not noticed before.  You feel that you are having side effects from medicines prescribed.  You develop trouble breathing or swallowing. SEEK IMMEDIATE MEDICAL CARE IF:   You develop a fever of 102 F (38.9 C) or higher.  You develop severe sweating.  You develop palpitations and/or rapid heart beat.  You develop shortness of breath.  You develop nausea and vomiting.  You develop extreme shakiness.  You develop agitation.  You develop lightheadedness or have a fainting episode. Document Released: 05/14/2007 Document Revised: 10/09/2011 Document Reviewed: 05/14/2007 [facility] Patient Information 2014 [place], [place].

## 2013-02-25 ENCOUNTER — Encounter: Payer: Self-pay | Admitting: Family Medicine

## 2013-02-25 DIAGNOSIS — E785 Hyperlipidemia, unspecified: Secondary | ICD-10-CM | POA: Insufficient documentation

## 2013-02-25 HISTORY — DX: Hyperlipidemia, unspecified: E78.5

## 2013-02-25 NOTE — Assessment & Plan Note (Signed)
Good response to Effexor XR 150 mg continue the same

## 2013-02-25 NOTE — Assessment & Plan Note (Signed)
Well controlled on current dose of Levothyroxine. 

## 2013-02-25 NOTE — Assessment & Plan Note (Signed)
mild

## 2013-02-25 NOTE — Progress Notes (Signed)
Patient ID: Rachael Johnson, female   DOB: 21-Nov-1950, 62 y.o.   MRN: [id number] Rachael Johnson [id number] 02-02-1951 02/25/2013      Progress Note-Follow Up  Subjective  Chief Complaint  Chief Complaint  Patient presents with  . Follow-up    2 month    HPI  Patient is a 62 year old Caucasian female who is here today in followup. She is pleased with her response to venlafaxine and does feel her mood is improving. She still undergoes stress but is tolerating it better. No palpitations or anxiety attacks. No suicidal ideation or severe depression. Denies any other recent illness. No chest pain no palpitations, shortness of breath, GI or GU concerns are noted today.  Past Medical History  Diagnosis Date  . Arthritis   . Allergy   . Thyroid disease   . Diverticulitis   . Frequent episodic tension-type headache   . Hypertension   . Seizure     childhood  . Anemia, iron deficiency 07/08/2012  . Depression with anxiety 03/24/2011  . Depression   . GERD (gastroesophageal reflux disease)   . Essential thrombocythemia 01/24/2011  . H. pylori infection 12/19/2012  . Other and unspecified hyperlipidemia 02/25/2013    Past Surgical History  Procedure Laterality Date  . Breast surgery  1992    biopsy, benign. Fibrocystic  . Abdominal hysterectomy      1992  . Umbilical hernia repair N/A 09/25/2012    Procedure: HERNIA REPAIR UMBILICAL ADULT;  Surgeon: Shelly Rubenstein, MD;  Location: [facility];  Service: General;  Laterality: N/A;    Family History  Problem Relation Age of Onset  . Arthritis Mother   . Cancer Mother     ovarian  . Hypertension Mother   . Heart disease Mother   . Heart failure Mother   . Arthritis Father   . Cancer Father     lung  . Hypertension Father   . Cancer Sister     lung  . Cancer Brother     lung  . Cancer Brother     prostate    History   Social History  . Marital Status: Widowed    Spouse Name: N/A    Number of Children: 2  . Years of  Education: N/A   Occupational History  . APL operator     [employer]   Social History Main Topics  . Smoking status: Former Smoker -- 3.50 packs/day for 5 years    Types: Cigarettes    Quit date: 07/31/1990  . Smokeless tobacco: Never Used  . Alcohol Use: No  . Drug Use: No  . Sexually Active: No   Other Topics Concern  . Not on file   Social History Narrative   Regular exercise: no   Caffeine Use: 2-3 weekly    Current Outpatient Prescriptions on File Prior to Visit  Medication Sig Dispense Refill  . ALPRAZolam (XANAX) 1 MG tablet Take 1 tablet (1 mg total) by mouth 3 (three) times daily as needed for sleep or anxiety.  90 tablet  1  . aspirin EC 81 MG tablet Take 81 mg by mouth every morning.      . citalopram (CELEXA) 40 MG tablet TAKE 1.5 TABLETS (60MG ) BY MOUTH EVERY MORNING  45 tablet  2  . fluorometholone (FML) 0.1 % ophthalmic suspension Place 1 drop into both eyes daily as needed (For dry eyes.).       Marland Kitchen hydroxyurea (HYDREA) 500 MG capsule Take 1 capsule (500  mg total) by mouth daily after breakfast.  90 capsule  3  . levothyroxine (SYNTHROID, LEVOTHROID) 88 MCG tablet TAKE 1 TABLET (88 MCG TOTAL)  BY MOUTH DAILY BEFORE BREAKFAST.  30 tablet  2  . Multiple Vitamin (MULTIVITAMIN PO) Take 1 tablet by mouth every morning.       . naproxen (NAPROSYN) 500 MG tablet TAKE ONE TABLET BY MOUTH TWICE A DAY WITH MEALS  60 tablet  1  . omeprazole (PRILOSEC) 40 MG capsule TAKE ONE CAPSULE BY MOUTH ONE TIME DAILY  90 capsule  2  . prochlorperazine (COMPAZINE) 10 MG tablet TAKE ONE TABLET BY MOUTH EVERY 6 HOURS AS NEEDED NAUSEA AND VOMITING  60 tablet  2  . ranitidine (ZANTAC) 300 MG tablet TAKE 1 TABLET (300 MG TOTAL)  BY MOUTH AT BEDTIME AS NEEDED FORHEARTBURN  30 tablet  4  . Vitamin D, Ergocalciferol, (DRISDOL) 50000 UNITS CAPS TAKE 1 CAPSULE BY MOUTH EVERY OTHER WEEK AS INSTRUCTED  6 capsule  3   No current facility-administered medications on file prior to visit.    No Known  Allergies  Review of Systems  Review of Systems  Constitutional: Negative for fever and malaise/fatigue.  HENT: Negative for congestion.   Eyes: Negative for discharge.  Respiratory: Negative for shortness of breath.   Cardiovascular: Negative for chest pain, palpitations and leg swelling.  Gastrointestinal: Negative for nausea, abdominal pain and diarrhea.  Genitourinary: Negative for dysuria.  Musculoskeletal: Negative for falls.  Skin: Negative for rash.  Neurological: Negative for loss of consciousness and headaches.  Endo/Heme/Allergies: Negative for polydipsia.  Psychiatric/Behavioral: Positive for depression. Negative for suicidal ideas. The patient is nervous/anxious. The patient does not have insomnia.     Objective  BP 110/78  Pulse 62  Temp(Src) 98.2 F (36.8 C) (Oral)  Ht 5' 3.5" (1.613 m)  Wt 112 lb 1.3 oz (50.839 kg)  BMI 19.54 kg/m2  SpO2 97%  LMP 07/31/1990  Physical Exam  Physical Exam  Constitutional: She is oriented to person, place, and time and well-developed, well-nourished, and in no distress. No distress.  HENT:  Head: Normocephalic and atraumatic.  Eyes: Conjunctivae are normal.  Neck: Neck supple. No thyromegaly present.  Cardiovascular: Normal rate, regular rhythm and normal heart sounds.   No murmur heard. Pulmonary/Chest: Effort normal and breath sounds normal. She has no wheezes.  Abdominal: She exhibits no distension and no mass.  Musculoskeletal: She exhibits no edema.  Lymphadenopathy:    She has no cervical adenopathy.  Neurological: She is alert and oriented to person, place, and time.  Skin: Skin is warm and dry. No rash noted. She is not diaphoretic.  Psychiatric: Memory, affect and judgment normal.    Lab Results  Component Value Date   TSH 2.374 02/21/2013   Lab Results  Component Value Date   WBC 3.4* 02/21/2013   HGB 12.2 02/21/2013   HCT 35.6* 02/21/2013   MCV 112.7* 02/21/2013   PLT 534* 02/21/2013   Lab Results   Component Value Date   CREATININE 0.62 02/21/2013   BUN 16 02/21/2013   NA 139 02/21/2013   K 4.5 02/21/2013   CL 104 02/21/2013   CO2 28 02/21/2013   Lab Results  Component Value Date   ALT 10 02/21/2013   AST 18 02/21/2013   ALKPHOS 51 02/21/2013   BILITOT 0.3 02/21/2013   Lab Results  Component Value Date   CHOL 194 09/18/2011   Lab Results  Component Value Date   HDL 63  09/18/2011   Lab Results  Component Value Date   LDLCALC 122* 09/18/2011   Lab Results  Component Value Date   TRIG 47 09/18/2011   Lab Results  Component Value Date   CHOLHDL 3.1 09/18/2011     Assessment & Plan  Other and unspecified hyperlipidemia Mild, avoid trans fats, increase exercise. Add krill oil caps  Hypothyroid Well controlled on current dose of Levothyroxine  Macrocytic anemia mild  Depression with anxiety Good response to Effexor XR 150 mg continue the same

## 2013-02-25 NOTE — Assessment & Plan Note (Signed)
Mild, avoid trans fats, increase exercise. Add krill oil caps

## 2013-02-26 NOTE — Progress Notes (Signed)
Quick Note:  Marj informed patient Informed and voiced understanding ______

## 2013-03-04 ENCOUNTER — Other Ambulatory Visit: Payer: Self-pay | Admitting: *Deleted

## 2013-03-04 MED ORDER — NAPROXEN 500 MG PO TABS: ORAL_TABLET | ORAL | Status: DC

## 2013-03-04 NOTE — Telephone Encounter (Signed)
Fax request for 90-day supply, Rx to pharmacy/[facility]

## 2013-03-04 NOTE — Addendum Note (Signed)
Addended by: Regis Bill on: 03/04/2013 03:29 PM   Modules accepted: Orders

## 2013-03-07 ENCOUNTER — Ambulatory Visit (HOSPITAL_BASED_OUTPATIENT_CLINIC_OR_DEPARTMENT_OTHER): Payer: BC Managed Care – PPO | Admitting: Hematology & Oncology

## 2013-03-07 ENCOUNTER — Other Ambulatory Visit (HOSPITAL_BASED_OUTPATIENT_CLINIC_OR_DEPARTMENT_OTHER): Payer: BC Managed Care – PPO | Admitting: Lab

## 2013-03-07 VITALS — BP 115/66 | HR 67 | Temp 97.8°F | Resp 16 | Ht 63.0 in | Wt 114.0 lb

## 2013-03-07 DIAGNOSIS — D509 Iron deficiency anemia, unspecified: Secondary | ICD-10-CM

## 2013-03-07 DIAGNOSIS — D473 Essential (hemorrhagic) thrombocythemia: Secondary | ICD-10-CM

## 2013-03-07 DIAGNOSIS — D508 Other iron deficiency anemias: Secondary | ICD-10-CM

## 2013-03-07 LAB — CBC WITH DIFFERENTIAL (CANCER CENTER ONLY)
BASO#: 0 10*3/uL (ref 0.0–0.2)
BASO%: 0.9 % (ref 0.0–2.0)
EOS%: 4.5 % (ref 0.0–7.0)
Eosinophils Absolute: 0.2 10*3/uL (ref 0.0–0.5)
HCT: 35.8 % (ref 34.8–46.6)
HGB: 12 g/dL (ref 11.6–15.9)
LYMPH#: 1.3 10*3/uL (ref 0.9–3.3)
LYMPH%: 38.9 % (ref 14.0–48.0)
MCH: 39.9 pg — ABNORMAL HIGH (ref 26.0–34.0)
MCHC: 33.5 g/dL (ref 32.0–36.0)
MCV: 119 fL — ABNORMAL HIGH (ref 81–101)
MONO#: 0.2 10*3/uL (ref 0.1–0.9)
MONO%: 6.8 % (ref 0.0–13.0)
NEUT#: 1.7 10*3/uL (ref 1.5–6.5)
NEUT%: 48.9 % (ref 39.6–80.0)
Platelets: 483 10*3/uL — ABNORMAL HIGH (ref 145–400)
RBC: 3.01 10*6/uL — ABNORMAL LOW (ref 3.70–5.32)
RDW: 11.8 % (ref 11.1–15.7)
WBC: 3.4 10*3/uL — ABNORMAL LOW (ref 3.9–10.0)

## 2013-03-07 LAB — IRON AND TIBC CHCC
%SAT: 31 % (ref 21–57)
Iron: 63 ug/dL (ref 41–142)
TIBC: 204 ug/dL — ABNORMAL LOW (ref 236–444)
UIBC: 141 ug/dL (ref 120–384)

## 2013-03-07 LAB — FERRITIN CHCC: Ferritin: 315 ng/ml — ABNORMAL HIGH (ref 9–269)

## 2013-03-07 LAB — RETICULOCYTES (CHCC)
ABS Retic: 44 10*3/uL (ref 19.0–186.0)
RBC.: 3.14 MIL/uL — ABNORMAL LOW (ref 3.87–5.11)
Retic Ct Pct: 1.4 % (ref 0.4–2.3)

## 2013-03-07 LAB — CHCC SATELLITE - SMEAR

## 2013-03-07 NOTE — Progress Notes (Signed)
This office note has been dictated.

## 2013-03-08 NOTE — Progress Notes (Signed)
DIAGNOSES: 1. Essential thrombocythemia, JAK2 negative. 2. Intermittent iron-deficiency anemia.  CURRENT THERAPY: 1. Hydrea 500 mg p.o. q. day. 2. IV iron as indicated.  INTERIM HISTORY:  Ms. Bohlken comes in for her followup.  She is really doing well.  She has had no problems, and she is looking better.  She is still losing weight, but she says that she feels well.  It has been 2 years since her husband passed away in a car accident. This has been incredibly difficult for her to overcome.  Her family has been trying really hard to help her out.  Again, she is looking a little bit better.  She has had no bleeding or bruising.  She has had no problems with increasing fatigue.  When we last saw her back in May, her ferritin was 343 with an iron saturation of 26%.  PHYSICAL EXAM:  General:  This is a well-developed, well-nourished white female in no obvious distress.  Vital Signs:  Show a temperature of 97.8, pulse 67, respiratory rate 16, blood pressure 115/66.  Weight is 114.  Head and Neck:  Show a normocephalic, atraumatic skull.  There are no ocular or oral lesions.  There are no palpable cervical or supraclavicular lymph nodes.  Lungs:  Clear bilaterally.  Cardiac: Regular rate and rhythm with a normal S1 and S2.  There are no murmurs, rubs, or bruits.  Abdomen:  Soft.  She has good bowel sounds.  There is no fluid wave.  There is no palpable hepatosplenomegaly.  Extremities: Show no clubbing, cyanosis, or edema.  She has some osteoarthritic changes in her joints.  Skin:  Shows no rashes, ecchymoses, or petechia. Neurological:  Shows no focal neurological deficits.  LABORATORY STUDIES:  White cell count is 3.4.  Hemoglobin is 12. Hematocrit 35.8, platelet count 483.  MCV is 119.  IMPRESSION:  Ms. Guyette is a very charming 62 year old white female with essential thrombocythemia.  We do have to give her IV iron intermittently.  She has a nutritional deficiency with respect  to iron intake.  This is because of her husband passing and her just not eating well.  I think the last time we gave her iron was back in December of 2013.  We will plan to get her back in 3 more months.  I do not see that we need any blood work in between visits.    ______________________________ Josph Macho, M.D. PRE/MEDQ  D:  03/07/2013  T:  03/08/2013  Job:  [id number]

## 2013-03-10 ENCOUNTER — Telehealth: Payer: Self-pay | Admitting: Oncology

## 2013-03-10 NOTE — Telephone Encounter (Addendum)
Message copied by Lacie Draft on Mon Mar 10, 2013  4:13 PM ------      Message from: Josph Macho      Created: Fri Mar 07, 2013  6:22 PM       Call - iron is ok!!  Cindee Lame ------Left message on voicemail.

## 2013-03-14 ENCOUNTER — Other Ambulatory Visit: Payer: BC Managed Care – PPO | Admitting: Lab

## 2013-03-14 ENCOUNTER — Ambulatory Visit: Payer: BC Managed Care – PPO | Admitting: Hematology & Oncology

## 2013-04-16 ENCOUNTER — Other Ambulatory Visit: Payer: Self-pay | Admitting: Family Medicine

## 2013-04-18 ENCOUNTER — Encounter: Payer: Self-pay | Admitting: Family Medicine

## 2013-04-18 ENCOUNTER — Ambulatory Visit (INDEPENDENT_AMBULATORY_CARE_PROVIDER_SITE_OTHER): Payer: BC Managed Care – PPO | Admitting: Family Medicine

## 2013-04-18 VITALS — BP 102/80 | HR 83 | Temp 98.3°F | Ht 63.5 in | Wt 114.1 lb

## 2013-04-18 DIAGNOSIS — F32A Depression, unspecified: Secondary | ICD-10-CM

## 2013-04-18 DIAGNOSIS — D473 Essential (hemorrhagic) thrombocythemia: Secondary | ICD-10-CM

## 2013-04-18 DIAGNOSIS — F341 Dysthymic disorder: Secondary | ICD-10-CM

## 2013-04-18 DIAGNOSIS — E785 Hyperlipidemia, unspecified: Secondary | ICD-10-CM

## 2013-04-18 DIAGNOSIS — T7840XA Allergy, unspecified, initial encounter: Secondary | ICD-10-CM

## 2013-04-18 DIAGNOSIS — E039 Hypothyroidism, unspecified: Secondary | ICD-10-CM

## 2013-04-18 DIAGNOSIS — Z0000 Encounter for general adult medical examination without abnormal findings: Secondary | ICD-10-CM

## 2013-04-18 DIAGNOSIS — R031 Nonspecific low blood-pressure reading: Secondary | ICD-10-CM

## 2013-04-18 DIAGNOSIS — D649 Anemia, unspecified: Secondary | ICD-10-CM

## 2013-04-18 DIAGNOSIS — N951 Menopausal and female climacteric states: Secondary | ICD-10-CM

## 2013-04-18 DIAGNOSIS — H6120 Impacted cerumen, unspecified ear: Secondary | ICD-10-CM

## 2013-04-18 DIAGNOSIS — F418 Other specified anxiety disorders: Secondary | ICD-10-CM

## 2013-04-18 DIAGNOSIS — H6123 Impacted cerumen, bilateral: Secondary | ICD-10-CM

## 2013-04-18 DIAGNOSIS — F329 Major depressive disorder, single episode, unspecified: Secondary | ICD-10-CM

## 2013-04-18 LAB — RENAL FUNCTION PANEL
Albumin: 3.9 g/dL (ref 3.5–5.2)
BUN: 16 mg/dL (ref 6–23)
CO2: 31 mEq/L (ref 19–32)
Calcium: 9 mg/dL (ref 8.4–10.5)
Chloride: 105 mEq/L (ref 96–112)
Creat: 0.64 mg/dL (ref 0.50–1.10)
Glucose, Bld: 48 mg/dL — ABNORMAL LOW (ref 70–99)
Phosphorus: 2.8 mg/dL (ref 2.3–4.6)
Potassium: 4.1 mEq/L (ref 3.5–5.3)
Sodium: 138 mEq/L (ref 135–145)

## 2013-04-18 LAB — LIPID PANEL
Cholesterol: 169 mg/dL (ref 0–200)
HDL: 53 mg/dL (ref >39)
LDL Cholesterol: 98 mg/dL (ref 0–99)
Total CHOL/HDL Ratio: 3.2 Ratio
Triglycerides: 92 mg/dL (ref <150)
VLDL: 18 mg/dL (ref 0–40)

## 2013-04-18 LAB — CBC
HCT: 34.3 % — ABNORMAL LOW (ref 36.0–46.0)
Hemoglobin: 11.7 g/dL — ABNORMAL LOW (ref 12.0–15.0)
MCH: 38.7 pg — ABNORMAL HIGH (ref 26.0–34.0)
MCHC: 34.1 g/dL (ref 30.0–36.0)
MCV: 113.6 fL — ABNORMAL HIGH (ref 78.0–100.0)
Platelets: 599 10*3/uL — ABNORMAL HIGH (ref 150–400)
RBC: 3.02 MIL/uL — ABNORMAL LOW (ref 3.87–5.11)
RDW: 13.8 % (ref 11.5–15.5)
WBC: 3.1 10*3/uL — ABNORMAL LOW (ref 4.0–10.5)

## 2013-04-18 MED ORDER — NEOMYCIN-POLYMYXIN-HC 1 % OT SOLN: 3.0000 [drp] | Freq: Every evening | OTIC | Status: DC | PRN

## 2013-04-18 MED ORDER — LORATADINE 10 MG PO TABS: 10.0000 mg | ORAL_TABLET | Freq: Every day | ORAL | Status: DC | PRN

## 2013-04-18 MED ORDER — ESTRADIOL 0.5 MG PO TABS: 0.5000 mg | ORAL_TABLET | Freq: Two times a day (BID) | ORAL | Status: DC

## 2013-04-18 MED ORDER — CITALOPRAM HYDROBROMIDE 40 MG PO TABS: 40.0000 mg | ORAL_TABLET | Freq: Every day | ORAL | Status: DC

## 2013-04-18 MED ORDER — ALPRAZOLAM 1 MG PO TABS: 1.0000 mg | ORAL_TABLET | Freq: Three times a day (TID) | ORAL | Status: DC | PRN

## 2013-04-18 NOTE — Patient Instructions (Signed)
For bug bites. Cleanse with Witch Hazel Astringent, coat with hydrocortisone cream as needed for itching and take a Claritin/Loratadine daily Try a generic probiotic such as probiotic perles or others  add a fiber supplement such as Benefiber.   Constipation, Adult Constipation is when a person has fewer than 3 bowel movements a week; has difficulty having a bowel movement; or has stools that are dry, hard, or larger than normal. As people grow older, constipation is more common. If you try to fix constipation with medicines that make you have a bowel movement (laxatives), the problem may get worse. Long-term laxative use may cause the muscles of the colon to become weak. A low-fiber diet, not taking in enough fluids, and taking certain medicines may make constipation worse. CAUSES   Certain medicines, such as antidepressants, pain medicine, iron supplements, antacids, and water pills.   Certain diseases, such as diabetes, irritable bowel syndrome (IBS), thyroid disease, or depression.   Not drinking enough water.   Not eating enough fiber-rich foods.   Stress or travel.  Lack of physical activity or exercise.  Not going to the restroom when there is the urge to have a bowel movement.  Ignoring the urge to have a bowel movement.  Using laxatives too much. SYMPTOMS   Having fewer than 3 bowel movements a week.   Straining to have a bowel movement.   Having hard, dry, or larger than normal stools.   Feeling full or bloated.   Pain in the lower abdomen.  Not feeling relief after having a bowel movement. DIAGNOSIS  Your caregiver will take a medical history and perform a physical exam. Further testing may be done for severe constipation. Some tests may include:   A barium enema X-ray to examine your rectum, colon, and sometimes, your small intestine.  A sigmoidoscopy to examine your lower colon.  A colonoscopy to examine your entire colon. TREATMENT  Treatment will  depend on the severity of your constipation and what is causing it. Some dietary treatments include drinking more fluids and eating more fiber-rich foods. Lifestyle treatments may include regular exercise. If these diet and lifestyle recommendations do not help, your caregiver may recommend taking over-the-counter laxative medicines to help you have bowel movements. Prescription medicines may be prescribed if over-the-counter medicines do not work.  HOME CARE INSTRUCTIONS   Increase dietary fiber in your diet, such as fruits, vegetables, whole grains, and beans. Limit high-fat and processed sugars in your diet, such as [place] fries, hamburgers, cookies, candies, and soda.   A fiber supplement may be added to your diet if you cannot get enough fiber from foods.   Drink enough fluids to keep your urine clear or pale yellow.   Exercise regularly or as directed by your caregiver.   Go to the restroom when you have the urge to go. Do not hold it.  Only take medicines as directed by your caregiver. Do not take other medicines for constipation without talking to your caregiver first. SEEK IMMEDIATE MEDICAL CARE IF:   You have bright red blood in your stool.   Your constipation lasts for more than 4 days or gets worse.   You have abdominal or rectal pain.   You have thin, pencil-like stools.  You have unexplained weight loss. MAKE SURE YOU:   Understand these instructions.  Will watch your condition.  Will get help right away if you are not doing well or get worse. Document Released: 04/14/2004 Document Revised: 10/09/2011 Document Reviewed: 06/20/2011 [employer]  Patient Information 2014 [employer].  

## 2013-04-19 LAB — TSH: TSH: 2.588 u[IU]/mL (ref 0.350–4.500)

## 2013-04-20 ENCOUNTER — Encounter: Payer: Self-pay | Admitting: Family Medicine

## 2013-04-20 DIAGNOSIS — Z7189 Other specified counseling: Secondary | ICD-10-CM | POA: Insufficient documentation

## 2013-04-20 DIAGNOSIS — Z0000 Encounter for general adult medical examination without abnormal findings: Secondary | ICD-10-CM | POA: Insufficient documentation

## 2013-04-20 NOTE — Progress Notes (Signed)
Patient ID: Rachael Johnson, female   DOB: [birth year], 62 y.o.   MRN: [id number] Rachael Johnson [id number] 1950/11/29 04/20/2013      Progress Note-Follow Up  Subjective  Chief Complaint  Chief Complaint  Patient presents with  . Annual Exam    physical    HPI  Patient is a 62 year old African American female in today for routine medical care. Overall she feels well at this time. No recent illness. Denies headaches, chest pain, palpitations, shortness of breath, GI or GU complaints. No significant back or abdominal pain. Is taking medications as prescribed. No GYN complaints. Has mammogram scheduled for later this month.  Past Medical History  Diagnosis Date  . Arthritis   . Allergy   . Thyroid disease   . Diverticulitis   . Frequent episodic tension-type headache   . Hypertension   . Seizure     childhood  . Anemia, iron deficiency 07/08/2012  . Depression with anxiety 03/24/2011  . Depression   . GERD (gastroesophageal reflux disease)   . Essential thrombocythemia 01/24/2011  . H. pylori infection 12/19/2012  . Other and unspecified hyperlipidemia 02/25/2013  . Preventative health care 04/20/2013    Past Surgical History  Procedure Laterality Date  . Breast surgery  1992    biopsy, benign. Fibrocystic  . Abdominal hysterectomy      1992  . Umbilical hernia repair N/A 09/25/2012    Procedure: HERNIA REPAIR UMBILICAL ADULT;  Surgeon: Shelly Rubenstein, MD;  Location: [facility];  Service: General;  Laterality: N/A;    Family History  Problem Relation Age of Onset  . Arthritis Mother   . Cancer Mother     ovarian  . Hypertension Mother   . Heart disease Mother     pacer  . Heart failure Mother   . Arthritis Father   . Cancer Father     lung  . Hypertension Father   . Cancer Sister     lung  . Cancer Brother     prostate  . Cancer Brother     lung  . Hypertension Son   . COPD Brother   . Heart disease Brother   . Hypertension Brother   . Alcohol abuse Brother    . Cirrhosis Brother   . Seizures Sister     History   Social History  . Marital Status: Widowed    Spouse Name: N/A    Number of Children: 2  . Years of Education: N/A   Occupational History  . APL operator     [employer]   Social History Main Topics  . Smoking status: Former Smoker -- 3.50 packs/day for 5 years    Types: Cigarettes    Quit date: 07/31/1990  . Smokeless tobacco: Never Used  . Alcohol Use: No  . Drug Use: No  . Sexual Activity: No   Other Topics Concern  . Not on file   Social History Narrative   Regular exercise: no   Caffeine Use: 2-3 weekly    Current Outpatient Prescriptions on File Prior to Visit  Medication Sig Dispense Refill  . acetaminophen-codeine (TYLENOL #3) 300-30 MG per tablet Take 1 tablet by mouth 3 (three) times daily as needed for pain.  65 tablet  3  . aspirin EC 81 MG tablet Take 81 mg by mouth every morning.      . fluorometholone (FML) 0.1 % ophthalmic suspension Place 1 drop into both eyes daily as needed (For dry eyes.).       Marland Kitchen  furosemide (LASIX) 20 MG tablet Take 1 tablet (20 mg total) by mouth every morning.  90 tablet  2  . hydroxyurea (HYDREA) 500 MG capsule Take 1 capsule (500 mg total) by mouth daily after breakfast.  90 capsule  3  . levothyroxine (SYNTHROID, LEVOTHROID) 88 MCG tablet TAKE 1 TABLET (88 MCG TOTAL)  BY MOUTH DAILY BEFORE BREAKFAST.  30 tablet  2  . Multiple Vitamin (MULTIVITAMIN PO) Take 1 tablet by mouth every morning.       . naproxen (NAPROSYN) 500 MG tablet TAKE ONE TABLET BY MOUTH TWICE A DAY WITH MEALS  180 tablet  0  . omeprazole (PRILOSEC) 40 MG capsule TAKE ONE CAPSULE BY MOUTH ONE TIME DAILY  90 capsule  2  . prochlorperazine (COMPAZINE) 10 MG tablet TAKE ONE TABLET BY MOUTH EVERY 6 HOURS AS NEEDED NAUSEA AND VOMITING  60 tablet  2  . ranitidine (ZANTAC) 300 MG tablet TAKE 1 TABLET (300 MG TOTAL)  BY MOUTH AT BEDTIME AS NEEDED FORHEARTBURN  30 tablet  4  . venlafaxine XR (EFFEXOR-XR) 150 MG 24 hr  capsule Take 1 capsule (150 mg total) by mouth daily.  90 capsule  1  . Vitamin D, Ergocalciferol, (DRISDOL) 50000 UNITS CAPS TAKE 1 CAPSULE BY MOUTH EVERY OTHER WEEK AS INSTRUCTED  6 capsule  3   No current facility-administered medications on file prior to visit.    No Known Allergies  Review of Systems  Review of Systems  Constitutional: Negative for fever, chills and malaise/fatigue.  HENT: Negative for hearing loss, nosebleeds and congestion.   Eyes: Negative for discharge.  Respiratory: Negative for cough, sputum production, shortness of breath and wheezing.   Cardiovascular: Negative for chest pain, palpitations and leg swelling.  Gastrointestinal: Negative for heartburn, nausea, vomiting, abdominal pain, diarrhea, constipation and blood in stool.  Genitourinary: Negative for dysuria, urgency, frequency and hematuria.  Musculoskeletal: Negative for myalgias, back pain and falls.  Skin: Negative for rash.  Neurological: Negative for dizziness, tremors, sensory change, focal weakness, loss of consciousness, weakness and headaches.  Endo/Heme/Allergies: Negative for polydipsia. Does not bruise/bleed easily.  Psychiatric/Behavioral: Negative for depression and suicidal ideas. The patient is not nervous/anxious and does not have insomnia.     Objective  BP 102/80  Pulse 83  Temp(Src) 98.3 F (36.8 C) (Oral)  Ht 5' 3.5" (1.613 m)  Wt 114 lb 1.9 oz (51.764 kg)  BMI 19.9 kg/m2  SpO2 96%  LMP 07/31/1990  Physical Exam  Physical Exam  Constitutional: She is oriented to person, place, and time and well-developed, well-nourished, and in no distress. No distress.  HENT:  Head: Normocephalic and atraumatic.  Right Ear: External ear normal.  Left Ear: External ear normal.  Nose: Nose normal.  Mouth/Throat: Oropharynx is clear and moist. No oropharyngeal exudate.  Eyes: Conjunctivae are normal. Pupils are equal, round, and reactive to light. Right eye exhibits no discharge. Left  eye exhibits no discharge. No scleral icterus.  Neck: Normal range of motion. Neck supple. No thyromegaly present.  Cardiovascular: Normal rate, regular rhythm, normal heart sounds and intact distal pulses.   No murmur heard. Pulmonary/Chest: Effort normal and breath sounds normal. No respiratory distress. She has no wheezes. She has no rales.  Abdominal: Soft. Bowel sounds are normal. She exhibits no distension and no mass. There is no tenderness.  Musculoskeletal: Normal range of motion. She exhibits no edema and no tenderness.  Lymphadenopathy:    She has no cervical adenopathy.  Neurological: She is alert  and oriented to person, place, and time. She has normal reflexes. No cranial nerve deficit. Coordination normal.  Skin: Skin is warm and dry. No rash noted. She is not diaphoretic.  Psychiatric: Mood, memory and affect normal.    Lab Results  Component Value Date   TSH 2.588 04/18/2013   Lab Results  Component Value Date   WBC 3.1* 04/18/2013   HGB 11.7* 04/18/2013   HCT 34.3* 04/18/2013   MCV 113.6* 04/18/2013   PLT 599* 04/18/2013   Lab Results  Component Value Date   CREATININE 0.64 04/18/2013   BUN 16 04/18/2013   NA 138 04/18/2013   K 4.1 04/18/2013   CL 105 04/18/2013   CO2 31 04/18/2013   Lab Results  Component Value Date   ALT 10 02/21/2013   AST 18 02/21/2013   ALKPHOS 51 02/21/2013   BILITOT 0.3 02/21/2013   Lab Results  Component Value Date   CHOL 169 04/18/2013   Lab Results  Component Value Date   HDL 53 04/18/2013   Lab Results  Component Value Date   LDLCALC 98 04/18/2013   Lab Results  Component Value Date   TRIG 92 04/18/2013   Lab Results  Component Value Date   CHOLHDL 3.2 04/18/2013     Assessment & Plan  Hypothyroid Stable on current dose of Levothyroxine. No changes  Essential thrombocythemia Is following with hematology, asymptomatic  Other and unspecified hyperlipidemia Diet controlled, avoid trans fats.Minimize simple carbs due to  elevated TG, increase exercise  Depression with anxiety Stable on current dose of Citalopram and Venlafaxine, warned not to stop meds abruptly.  Decreased blood pressure, not hypotension Well controlled today  Preventative health care Gets flu shot at work, consider Zostavax. Pap last year unremarkable repeat next year. Encouraged to proceed with annual MGM. Obtain old records from previous GYN. Annual labs reviewed.

## 2013-04-20 NOTE — Assessment & Plan Note (Signed)
Stable on current dose of Citalopram and Venlafaxine, warned not to stop meds abruptly.

## 2013-04-20 NOTE — Assessment & Plan Note (Signed)
Well controlled today.

## 2013-04-20 NOTE — Assessment & Plan Note (Signed)
Gets flu shot at work, consider Zostavax. Pap last year unremarkable repeat next year. Encouraged to proceed with annual MGM. Obtain old records from previous GYN. Annual labs reviewed.

## 2013-04-20 NOTE — Assessment & Plan Note (Signed)
Stable on current dose of Levothyroxine. No changes

## 2013-04-20 NOTE — Assessment & Plan Note (Signed)
Is following with hematology, asymptomatic

## 2013-04-20 NOTE — Assessment & Plan Note (Addendum)
Diet controlled, avoid trans fats.Minimize simple carbs due to elevated TG, increase exercise

## 2013-04-21 ENCOUNTER — Telehealth: Payer: Self-pay

## 2013-04-21 NOTE — Telephone Encounter (Signed)
Message copied by Court Joy on Mon Apr 21, 2013  4:41 PM ------      Message from: Danise Edge A      Created: Sun Apr 20, 2013 11:48 AM       Did not discuss Zostavax with her see if she is interested in a shingles shot please ------

## 2013-04-22 NOTE — Telephone Encounter (Signed)
Left a message for patient to return my call. 

## 2013-04-23 NOTE — Telephone Encounter (Signed)
Patient informed and would like the injection. I informed patient to call her insurance to verify if they will cover if she gets the injection here or at the pharmacy

## 2013-04-28 ENCOUNTER — Encounter: Payer: Self-pay | Admitting: Family Medicine

## 2013-04-28 ENCOUNTER — Ambulatory Visit (INDEPENDENT_AMBULATORY_CARE_PROVIDER_SITE_OTHER): Payer: BC Managed Care – PPO | Admitting: Family Medicine

## 2013-04-28 VITALS — BP 110/72 | HR 56 | Temp 97.6°F | Ht 63.5 in | Wt 113.0 lb

## 2013-04-28 DIAGNOSIS — H6123 Impacted cerumen, bilateral: Secondary | ICD-10-CM

## 2013-04-28 DIAGNOSIS — Z23 Encounter for immunization: Secondary | ICD-10-CM

## 2013-04-28 DIAGNOSIS — Z2911 Encounter for prophylactic immunotherapy for respiratory syncytial virus (RSV): Secondary | ICD-10-CM

## 2013-04-28 DIAGNOSIS — R031 Nonspecific low blood-pressure reading: Secondary | ICD-10-CM

## 2013-04-28 DIAGNOSIS — H6120 Impacted cerumen, unspecified ear: Secondary | ICD-10-CM

## 2013-04-28 DIAGNOSIS — Z0000 Encounter for general adult medical examination without abnormal findings: Secondary | ICD-10-CM

## 2013-04-28 NOTE — Progress Notes (Signed)
Patient ID: Rachael Johnson, female   DOB: Apr 01, 1951, 62 y.o.   MRN: [id number] Rachael Johnson [id number] Dec 03, 1950 04/28/2013      Progress Note-Follow Up  Subjective  Chief Complaint  Chief Complaint  Patient presents with  . flush ears  . Injections    zostavax    HPI  Patient is a 62 yo old caucasian in today for cerumen disimpaction. She's been using her Cortisporin Otic drops each bedtime. Denies headache or fevers. No discharge from ears. She did have a tooth pulled right lower jawline earlier in the week and her mouth pain has improved. No fevers or chills. No chest pain no palpitations, shortness of breath, GI or GU concerns.  Past Medical History  Diagnosis Date  . Arthritis   . Allergy   . Thyroid disease   . Diverticulitis   . Frequent episodic tension-type headache   . Hypertension   . Seizure     childhood  . Anemia, iron deficiency 07/08/2012  . Depression with anxiety 03/24/2011  . Depression   . GERD (gastroesophageal reflux disease)   . Essential thrombocythemia 01/24/2011  . H. pylori infection 12/19/2012  . Other and unspecified hyperlipidemia 02/25/2013  . Preventative health care 04/20/2013    Past Surgical History  Procedure Laterality Date  . Breast surgery  1992    biopsy, benign. Fibrocystic  . Abdominal hysterectomy      1992  . Umbilical hernia repair N/A 09/25/2012    Procedure: HERNIA REPAIR UMBILICAL ADULT;  Surgeon: Shelly Rubenstein, MD;  Location: [facility];  Service: General;  Laterality: N/A;    Family History  Problem Relation Age of Onset  . Arthritis Mother   . Cancer Mother     ovarian  . Hypertension Mother   . Heart disease Mother     pacer  . Heart failure Mother   . Arthritis Father   . Cancer Father     lung  . Hypertension Father   . Cancer Sister     lung  . Cancer Brother     prostate  . Cancer Brother     lung  . Hypertension Son   . COPD Brother   . Heart disease Brother   . Hypertension Brother   .  Alcohol abuse Brother   . Cirrhosis Brother   . Seizures Sister     History   Social History  . Marital Status: Widowed    Spouse Name: N/A    Number of Children: 2  . Years of Education: N/A   Occupational History  . APL operator     [employer]   Social History Main Topics  . Smoking status: Former Smoker -- 3.50 packs/day for 5 years    Types: Cigarettes    Quit date: 07/31/1990  . Smokeless tobacco: Never Used  . Alcohol Use: No  . Drug Use: No  . Sexual Activity: No   Other Topics Concern  . Not on file   Social History Narrative   Regular exercise: no   Caffeine Use: 2-3 weekly    Current Outpatient Prescriptions on File Prior to Visit  Medication Sig Dispense Refill  . acetaminophen-codeine (TYLENOL #3) 300-30 MG per tablet Take 1 tablet by mouth 3 (three) times daily as needed for pain.  65 tablet  3  . ALPRAZolam (XANAX) 1 MG tablet Take 1 tablet (1 mg total) by mouth 3 (three) times daily as needed for sleep or anxiety.  90 tablet  3  .  aspirin EC 81 MG tablet Take 81 mg by mouth every morning.      . citalopram (CELEXA) 40 MG tablet Take 1 tablet (40 mg total) by mouth daily. In am  90 tablet  3  . estradiol (ESTRACE) 0.5 MG tablet Take 1 tablet (0.5 mg total) by mouth 2 (two) times daily.  180 tablet  1  . fluorometholone (FML) 0.1 % ophthalmic suspension Place 1 drop into both eyes daily as needed (For dry eyes.).       Marland Kitchen furosemide (LASIX) 20 MG tablet Take 1 tablet (20 mg total) by mouth every morning.  90 tablet  2  . hydroxyurea (HYDREA) 500 MG capsule Take 1 capsule (500 mg total) by mouth daily after breakfast.  90 capsule  3  . levothyroxine (SYNTHROID, LEVOTHROID) 88 MCG tablet TAKE 1 TABLET (88 MCG TOTAL)  BY MOUTH DAILY BEFORE BREAKFAST.  30 tablet  2  . loratadine (CLARITIN) 10 MG tablet Take 1 tablet (10 mg total) by mouth daily as needed for allergies. Pruritus, allergies  30 tablet  11  . Multiple Vitamin (MULTIVITAMIN PO) Take 1 tablet by  mouth every morning.       . naproxen (NAPROSYN) 500 MG tablet TAKE ONE TABLET BY MOUTH TWICE A DAY WITH MEALS  180 tablet  0  . NEOMYCIN-POLYMYXIN-HYDROCORTISONE (CORTISPORIN) 1 % SOLN otic solution Place 3 drops into both ears at bedtime as needed. Wax, itching  10 mL  0  . omeprazole (PRILOSEC) 40 MG capsule TAKE ONE CAPSULE BY MOUTH ONE TIME DAILY  90 capsule  2  . prochlorperazine (COMPAZINE) 10 MG tablet TAKE ONE TABLET BY MOUTH EVERY 6 HOURS AS NEEDED NAUSEA AND VOMITING  60 tablet  2  . ranitidine (ZANTAC) 300 MG tablet TAKE 1 TABLET (300 MG TOTAL)  BY MOUTH AT BEDTIME AS NEEDED FORHEARTBURN  30 tablet  4  . venlafaxine XR (EFFEXOR-XR) 150 MG 24 hr capsule Take 1 capsule (150 mg total) by mouth daily.  90 capsule  1  . Vitamin D, Ergocalciferol, (DRISDOL) 50000 UNITS CAPS TAKE 1 CAPSULE BY MOUTH EVERY OTHER WEEK AS INSTRUCTED  6 capsule  3   No current facility-administered medications on file prior to visit.    No Known Allergies  Review of Systems  Review of Systems  Constitutional: Negative for fever and malaise/fatigue.  HENT: Negative for congestion.   Eyes: Negative for discharge.  Respiratory: Negative for shortness of breath.   Cardiovascular: Negative for chest pain, palpitations and leg swelling.  Gastrointestinal: Negative for nausea, abdominal pain and diarrhea.  Genitourinary: Negative for dysuria.  Musculoskeletal: Negative for falls.  Skin: Negative for rash.  Neurological: Negative for loss of consciousness and headaches.  Endo/Heme/Allergies: Negative for polydipsia.  Psychiatric/Behavioral: Negative for depression and suicidal ideas. The patient is not nervous/anxious and does not have insomnia.     Objective  BP 110/72  Pulse 56  Temp(Src) 97.6 F (36.4 C) (Oral)  Ht 5' 3.5" (1.613 m)  Wt 113 lb (51.256 kg)  BMI 19.7 kg/m2  SpO2 99%  LMP 07/31/1990  Physical Exam  Physical Exam  Constitutional: She is oriented to person, place, and time and  well-developed, well-nourished, and in no distress. No distress.  HENT:  Head: Normocephalic and atraumatic.  Eyes: Conjunctivae are normal.  Neck: Neck supple. No thyromegaly present.  Cardiovascular: Normal rate, regular rhythm and normal heart sounds.   No murmur heard. Pulmonary/Chest: Effort normal and breath sounds normal. She has no wheezes.  Abdominal: She exhibits no distension and no mass.  Musculoskeletal: She exhibits no edema.  Lymphadenopathy:    She has no cervical adenopathy.  Neurological: She is alert and oriented to person, place, and time.  Skin: Skin is warm and dry. No rash noted. She is not diaphoretic.  Psychiatric: Memory, affect and judgment normal.    Lab Results  Component Value Date   TSH 2.588 04/18/2013   Lab Results  Component Value Date   WBC 3.1* 04/18/2013   HGB 11.7* 04/18/2013   HCT 34.3* 04/18/2013   MCV 113.6* 04/18/2013   PLT 599* 04/18/2013   Lab Results  Component Value Date   CREATININE 0.64 04/18/2013   BUN 16 04/18/2013   NA 138 04/18/2013   K 4.1 04/18/2013   CL 105 04/18/2013   CO2 31 04/18/2013   Lab Results  Component Value Date   ALT 10 02/21/2013   AST 18 02/21/2013   ALKPHOS 51 02/21/2013   BILITOT 0.3 02/21/2013   Lab Results  Component Value Date   CHOL 169 04/18/2013   Lab Results  Component Value Date   HDL 53 04/18/2013   Lab Results  Component Value Date   LDLCALC 98 04/18/2013   Lab Results  Component Value Date   TRIG 92 04/18/2013   Lab Results  Component Value Date   CHOLHDL 3.2 04/18/2013     Assessment & Plan  Cerumen impaction Flushed in office tolerated well. Use Cortisporin hc otic qhs for the next week. Call with any concerns  Decreased blood pressure, not hypotension Well controlled today, no changes  Preventative health care Given Zostavax today

## 2013-04-28 NOTE — Assessment & Plan Note (Signed)
Flushed in office tolerated well. Use Cortisporin hc otic qhs for the next week. Call with any concerns

## 2013-04-28 NOTE — Assessment & Plan Note (Signed)
Given Zostavax today 

## 2013-04-28 NOTE — Assessment & Plan Note (Signed)
Well controlled today, no changes 

## 2013-05-01 ENCOUNTER — Other Ambulatory Visit: Payer: Self-pay | Admitting: Family Medicine

## 2013-05-09 ENCOUNTER — Other Ambulatory Visit: Payer: Self-pay | Admitting: Family Medicine

## 2013-05-12 ENCOUNTER — Other Ambulatory Visit: Payer: Self-pay | Admitting: Family Medicine

## 2013-05-12 NOTE — Telephone Encounter (Signed)
Rx request Denied; Last Rx: furosemide (LASIX) 20 MG tablet 90 tablet 2 02/21/2013 Sig - Route: Take 1 tablet (20 mg total) by mouth every morning. - Oral E-Prescribing Status: Receipt confirmed by pharmacy (02/21/2013 5:04 PM EDT)/SLS

## 2013-05-30 ENCOUNTER — Encounter: Payer: Self-pay | Admitting: Family Medicine

## 2013-05-30 ENCOUNTER — Ambulatory Visit (HOSPITAL_BASED_OUTPATIENT_CLINIC_OR_DEPARTMENT_OTHER): Payer: BC Managed Care – PPO | Admitting: Hematology & Oncology

## 2013-05-30 ENCOUNTER — Ambulatory Visit (INDEPENDENT_AMBULATORY_CARE_PROVIDER_SITE_OTHER): Payer: BC Managed Care – PPO | Admitting: Family Medicine

## 2013-05-30 ENCOUNTER — Other Ambulatory Visit (HOSPITAL_BASED_OUTPATIENT_CLINIC_OR_DEPARTMENT_OTHER): Payer: BC Managed Care – PPO | Admitting: Lab

## 2013-05-30 VITALS — BP 126/68 | HR 74 | Temp 98.6°F | Resp 14 | Ht 65.0 in | Wt 112.0 lb

## 2013-05-30 VITALS — BP 120/84 | HR 59 | Temp 97.7°F | Ht 63.5 in | Wt 112.1 lb

## 2013-05-30 DIAGNOSIS — F418 Other specified anxiety disorders: Secondary | ICD-10-CM

## 2013-05-30 DIAGNOSIS — F341 Dysthymic disorder: Secondary | ICD-10-CM

## 2013-05-30 DIAGNOSIS — E039 Hypothyroidism, unspecified: Secondary | ICD-10-CM

## 2013-05-30 DIAGNOSIS — R031 Nonspecific low blood-pressure reading: Secondary | ICD-10-CM

## 2013-05-30 DIAGNOSIS — E785 Hyperlipidemia, unspecified: Secondary | ICD-10-CM

## 2013-05-30 DIAGNOSIS — D473 Essential (hemorrhagic) thrombocythemia: Secondary | ICD-10-CM

## 2013-05-30 DIAGNOSIS — B379 Candidiasis, unspecified: Secondary | ICD-10-CM

## 2013-05-30 LAB — IRON AND TIBC CHCC
%SAT: 35 % (ref 21–57)
Iron: 74 ug/dL (ref 41–142)
TIBC: 209 ug/dL — ABNORMAL LOW (ref 236–444)
UIBC: 135 ug/dL (ref 120–384)

## 2013-05-30 LAB — CBC WITH DIFFERENTIAL (CANCER CENTER ONLY)
BASO#: 0 10*3/uL (ref 0.0–0.2)
BASO%: 0.9 % (ref 0.0–2.0)
EOS%: 3.7 % (ref 0.0–7.0)
Eosinophils Absolute: 0.2 10*3/uL (ref 0.0–0.5)
HCT: 36.1 % (ref 34.8–46.6)
HGB: 12.2 g/dL (ref 11.6–15.9)
LYMPH#: 0.8 10*3/uL — ABNORMAL LOW (ref 0.9–3.3)
LYMPH%: 17.1 % (ref 14.0–48.0)
MCH: 40.1 pg — ABNORMAL HIGH (ref 26.0–34.0)
MCHC: 33.8 g/dL (ref 32.0–36.0)
MCV: 119 fL — ABNORMAL HIGH (ref 81–101)
MONO#: 0.3 10*3/uL (ref 0.1–0.9)
MONO%: 6.1 % (ref 0.0–13.0)
NEUT#: 3.3 10*3/uL (ref 1.5–6.5)
NEUT%: 72.2 % (ref 39.6–80.0)
Platelets: 497 10*3/uL — ABNORMAL HIGH (ref 145–400)
RBC: 3.04 10*6/uL — ABNORMAL LOW (ref 3.70–5.32)
RDW: 12.5 % (ref 11.1–15.7)
WBC: 4.6 10*3/uL (ref 3.9–10.0)

## 2013-05-30 LAB — FERRITIN CHCC: Ferritin: 332 ng/ml — ABNORMAL HIGH (ref 9–269)

## 2013-05-30 MED ORDER — HYDROXYUREA 500 MG PO CAPS: 500.0000 mg | ORAL_CAPSULE | Freq: Every day | ORAL | Status: DC

## 2013-05-30 MED ORDER — CEPHALEXIN 500 MG PO CAPS: 500.0000 mg | ORAL_CAPSULE | Freq: Four times a day (QID) | ORAL | Status: DC

## 2013-05-30 MED ORDER — FLUCONAZOLE 150 MG PO TABS: 150.0000 mg | ORAL_TABLET | ORAL | Status: DC

## 2013-05-30 MED ORDER — PROCHLORPERAZINE MALEATE 10 MG PO TABS: 10.0000 mg | ORAL_TABLET | Freq: Four times a day (QID) | ORAL | Status: DC | PRN

## 2013-05-30 NOTE — Patient Instructions (Signed)
Witch Hazel Astringent on a cotton pad to clean after a bowel movement.  Add Benefiber twice a day and 64 oz of fluids daily Stool Softener every other day, Docusate 100 mg  Constipation, Adult Constipation is when a person has fewer than 3 bowel movements a week; has difficulty having a bowel movement; or has stools that are dry, hard, or larger than normal. As people grow older, constipation is more common. If you try to fix constipation with medicines that make you have a bowel movement (laxatives), the problem may get worse. Long-term laxative use may cause the muscles of the colon to become weak. A low-fiber diet, not taking in enough fluids, and taking certain medicines may make constipation worse. CAUSES   Certain medicines, such as antidepressants, pain medicine, iron supplements, antacids, and water pills.   Certain diseases, such as diabetes, irritable bowel syndrome (IBS), thyroid disease, or depression.   Not drinking enough water.   Not eating enough fiber-rich foods.   Stress or travel.  Lack of physical activity or exercise.  Not going to the restroom when there is the urge to have a bowel movement.  Ignoring the urge to have a bowel movement.  Using laxatives too much. SYMPTOMS   Having fewer than 3 bowel movements a week.   Straining to have a bowel movement.   Having hard, dry, or larger than normal stools.   Feeling full or bloated.   Pain in the lower abdomen.  Not feeling relief after having a bowel movement. DIAGNOSIS  Your caregiver will take a medical history and perform a physical exam. Further testing may be done for severe constipation. Some tests may include:   A barium enema X-ray to examine your rectum, colon, and sometimes, your small intestine.  A sigmoidoscopy to examine your lower colon.  A colonoscopy to examine your entire colon. TREATMENT  Treatment will depend on the severity of your constipation and what is causing it. Some  dietary treatments include drinking more fluids and eating more fiber-rich foods. Lifestyle treatments may include regular exercise. If these diet and lifestyle recommendations do not help, your caregiver may recommend taking over-the-counter laxative medicines to help you have bowel movements. Prescription medicines may be prescribed if over-the-counter medicines do not work.  HOME CARE INSTRUCTIONS   Increase dietary fiber in your diet, such as fruits, vegetables, whole grains, and beans. Limit high-fat and processed sugars in your diet, such as [place] fries, hamburgers, cookies, candies, and soda.   A fiber supplement may be added to your diet if you cannot get enough fiber from foods.   Drink enough fluids to keep your urine clear or pale yellow.   Exercise regularly or as directed by your caregiver.   Go to the restroom when you have the urge to go. Do not hold it.  Only take medicines as directed by your caregiver. Do not take other medicines for constipation without talking to your caregiver first. SEEK IMMEDIATE MEDICAL CARE IF:   You have bright red blood in your stool.   Your constipation lasts for more than 4 days or gets worse.   You have abdominal or rectal pain.   You have thin, pencil-like stools.  You have unexplained weight loss. MAKE SURE YOU:   Understand these instructions.  Will watch your condition.  Will get help right away if you are not doing well or get worse. Document Released: 04/14/2004 Document Revised: 10/09/2011 Document Reviewed: 06/20/2011 [facility] Patient Information 2014 [place], [place].

## 2013-05-30 NOTE — Progress Notes (Signed)
This office note has been dictated.

## 2013-05-30 NOTE — Addendum Note (Signed)
Addended by: Arlan Organ R on: 05/30/2013 09:09 AM   Modules accepted: Orders

## 2013-05-31 NOTE — Progress Notes (Signed)
DIAGNOSES: 1. Essential thrombocythemia, JAK2 negative. 2. Intermittent iron-deficiency anemia.  CURRENT THERAPY: 1. Hydrea 1200 mg p.o. q. day. 2. IV iron as indicated.  INTERIM HISTORY:  Ms. Widmer come in for a followup. She is have a little bit of a tough time with the holidays coming up.  Her husband passed away about 2-1/2 years ago.  It has still been a very difficult time for her.  Thankfully, her family has been incredibly supportive and has helped her out so, so much.  She is still working.  She is getting a little bit more tired at work.  When we last saw her in August, her ferritin was 350 with an iron saturation of 31%.  She has had no problems with bleeding.  There has been no abdominal pain.  She has had no cough or shortness of breath.  There has been some nausea.  She is on quite a few medications.  We will go ahead and refill the Compazine for her.  She has had no rashes.  There has been no pain in her hands or feet. She has had no redness in her hands or feet.  There have been no mouth sores.  PHYSICAL EXAMINATION:  General:  This is a well-developed, well- nourished white female in no obvious distress.  Vital signs show temperature of 98.6, pulse 74, respiratory rate 14, blood pressure 126/68.  Weight is 112 pounds.  Head and neck:  Normocephalic, atraumatic skull.  There are no ocular or oral lesions.  There are no palpable cervical or supraclavicular lymph nodes.  Lungs:  Clear bilaterally.  Cardiac:  Regular rate and rhythm with a normal S1, S2. There are no murmurs, rubs or bruits.  Abdomen:  Soft.  She has good bowel sounds.  There is no fluid wave.  There is no palpable abdominal mass.  There is no fluid wave.  There is no palpable hepatosplenomegaly. Extremities:  No clubbing, cyanosis or edema.  She has good range motion of her joints.  There are some slight osteoarthritic changes in her joints.  She has no joint swelling, erythema or warmth.  Skin:   No rashes, ecchymosis, or petechia.  Neurological:  Shows no focal neurological deficits.  LABORATORY STUDIES:  White cell count is 4.6, hemoglobin 12.2, hematocrit 36.1, platelet count 497.  MCV is 119. Peripheral smear shows some microcytic red cells.  There are no nucleated red blood cells.  I see no rouleaux formation.  There are no target cells.  She has no spherocytes or schistocytes.  White cells appear normal in morphology and maturation.  There may be some slight increased hypersegmented polys.  There is no immature myeloid or lymphoid forms.  Platelets are slightly increased in number.  She has several large platelets.  Platelets are well granulated.  IMPRESSION:  Ms. Pogosyan is a very nice 62 year old white female with essential thrombocythemia.  We have her platelet count fairly well controlled right now.  I will not make any adjustment with her Hydrea.  I would not think that she needs any iron.  The MCV is elevated because of the Hydrea.  We will continue to __________ hard for her.  It has been a very, very difficult time for her since her husband passed away.  We will plan to get her back in another 3 months now.  I do not think we need any blood work in between visits unless she has any issues.  I reviewed the lab work with her.  I  went over the blood smear with her.    ______________________________ Josph Macho, M.D. PRE/MEDQ  D:  05/30/2013  T:  05/31/2013  Job:  [id number]

## 2013-06-01 ENCOUNTER — Encounter: Payer: Self-pay | Admitting: Family Medicine

## 2013-06-01 ENCOUNTER — Other Ambulatory Visit: Payer: Self-pay | Admitting: Family Medicine

## 2013-06-01 NOTE — Progress Notes (Signed)
Patient ID: Rachael Johnson, female   DOB: [birth year], 62 y.o.   MRN: [id number] ZEENA STARKEL [id number] 12-09-1950 06/01/2013      Progress Note-Follow Up  Subjective  Chief Complaint  Chief Complaint  Patient presents with  . Follow-up    2 month    HPI  Patient is a 62 year old female who is in today for followup. Overall she's doing well. Her stomach is improved. No recent illness. No chest pain no palpitations or shortness of breath. No GI or GU complaints. Mood is improved with venlafaxine and she is tolerating stress better at this time. Taking medications as prescribed  Past Medical History  Diagnosis Date  . Arthritis   . Allergy   . Thyroid disease   . Diverticulitis   . Frequent episodic tension-type headache   . Hypertension   . Seizure     childhood  . Anemia, iron deficiency 07/08/2012  . Depression with anxiety 03/24/2011  . Depression   . GERD (gastroesophageal reflux disease)   . Essential thrombocythemia 01/24/2011  . H. pylori infection 12/19/2012  . Other and unspecified hyperlipidemia 02/25/2013  . Preventative health care 04/20/2013  . Cerumen impaction 04/28/2013    Past Surgical History  Procedure Laterality Date  . Breast surgery  1992    biopsy, benign. Fibrocystic  . Abdominal hysterectomy      1992  . Umbilical hernia repair N/A 09/25/2012    Procedure: HERNIA REPAIR UMBILICAL ADULT;  Surgeon: Shelly Rubenstein, MD;  Location: [facility];  Service: General;  Laterality: N/A;    Family History  Problem Relation Age of Onset  . Arthritis Mother   . Cancer Mother     ovarian  . Hypertension Mother   . Heart disease Mother     pacer  . Heart failure Mother   . Arthritis Father   . Cancer Father     lung  . Hypertension Father   . Cancer Sister     lung  . Cancer Brother     prostate  . Cancer Brother     lung  . Hypertension Son   . COPD Brother   . Heart disease Brother   . Hypertension Brother   . Alcohol abuse Brother   .  Cirrhosis Brother   . Seizures Sister     History   Social History  . Marital Status: Widowed    Spouse Name: N/A    Number of Children: 2  . Years of Education: N/A   Occupational History  . APL operator     [employer]   Social History Main Topics  . Smoking status: Former Smoker -- 3.50 packs/day for 5 years    Types: Cigarettes    Quit date: 07/31/1990  . Smokeless tobacco: Never Used  . Alcohol Use: No  . Drug Use: No  . Sexual Activity: No   Other Topics Concern  . Not on file   Social History Narrative   Regular exercise: no   Caffeine Use: 2-3 weekly    Current Outpatient Prescriptions on File Prior to Visit  Medication Sig Dispense Refill  . acetaminophen-codeine (TYLENOL #3) 300-30 MG per tablet Take 1 tablet by mouth 3 (three) times daily as needed for pain.  65 tablet  3  . ALPRAZolam (XANAX) 1 MG tablet Take 1 tablet (1 mg total) by mouth 3 (three) times daily as needed for sleep or anxiety.  90 tablet  3  . aspirin EC 81 MG tablet Take  81 mg by mouth every morning.      . citalopram (CELEXA) 40 MG tablet Take 1 tablet (40 mg total) by mouth daily. In am  90 tablet  3  . estradiol (ESTRACE) 0.5 MG tablet TAKE 1 TABLET DAILY  90 tablet  0  . fluorometholone (FML) 0.1 % ophthalmic suspension Place 1 drop into both eyes daily as needed (For dry eyes.).       Marland Kitchen furosemide (LASIX) 20 MG tablet Take 1 tablet (20 mg total) by mouth every morning.  90 tablet  2  . hydroxyurea (HYDREA) 500 MG capsule Take 1 capsule (500 mg total) by mouth daily after breakfast.  90 capsule  3  . levothyroxine (SYNTHROID, LEVOTHROID) 88 MCG tablet TAKE 1 TABLET (88 MCG TOTAL)   BY MOUTH DAILY BEFORE BREAKFAST.  30 tablet  1  . loratadine (CLARITIN) 10 MG tablet Take 1 tablet (10 mg total) by mouth daily as needed for allergies. Pruritus, allergies  30 tablet  11  . Multiple Vitamin (MULTIVITAMIN PO) Take 1 tablet by mouth every morning.       . naproxen (NAPROSYN) 500 MG tablet TAKE  ONE TABLET BY MOUTH TWICE A DAY WITH MEALS  180 tablet  0  . NEOMYCIN-POLYMYXIN-HYDROCORTISONE (CORTISPORIN) 1 % SOLN otic solution Place 3 drops into both ears at bedtime as needed. Wax, itching  10 mL  0  . omeprazole (PRILOSEC) 40 MG capsule TAKE ONE CAPSULE BY MOUTH ONE TIME DAILY  90 capsule  2  . prochlorperazine (COMPAZINE) 10 MG tablet Take 1 tablet (10 mg total) by mouth every 6 (six) hours as needed.  90 tablet  2  . ranitidine (ZANTAC) 300 MG tablet TAKE 1 TABLET (300 MG TOTAL)  BY MOUTH AT BEDTIME AS NEEDED FORHEARTBURN  30 tablet  4  . venlafaxine XR (EFFEXOR-XR) 150 MG 24 hr capsule Take 1 capsule (150 mg total) by mouth daily.  90 capsule  1  . Vitamin D, Ergocalciferol, (DRISDOL) 50000 UNITS CAPS TAKE 1 CAPSULE BY MOUTH EVERY OTHER WEEK AS INSTRUCTED  6 capsule  3   No current facility-administered medications on file prior to visit.    No Known Allergies  Review of Systems  Review of Systems  Constitutional: Negative for fever and malaise/fatigue.  HENT: Negative for congestion.   Eyes: Negative for discharge.  Respiratory: Negative for shortness of breath.   Cardiovascular: Negative for chest pain, palpitations and leg swelling.  Gastrointestinal: Negative for nausea, abdominal pain and diarrhea.  Genitourinary: Negative for dysuria.  Musculoskeletal: Negative for falls.  Skin: Negative for rash.  Neurological: Negative for loss of consciousness and headaches.  Endo/Heme/Allergies: Negative for polydipsia.  Psychiatric/Behavioral: Negative for depression and suicidal ideas. The patient is not nervous/anxious and does not have insomnia.     Objective  BP 120/84  Pulse 59  Temp(Src) 97.7 F (36.5 C) (Oral)  Ht 5' 3.5" (1.613 m)  Wt 112 lb 1.3 oz (50.839 kg)  BMI 19.54 kg/m2  SpO2 97%  LMP 07/31/1990  Physical Exam  Physical Exam  Constitutional: She is oriented to person, place, and time and well-developed, well-nourished, and in no distress. No distress.   HENT:  Head: Normocephalic and atraumatic.  Eyes: Conjunctivae are normal.  Neck: Neck supple. No thyromegaly present.  Cardiovascular: Normal rate, regular rhythm and normal heart sounds.   No murmur heard. Pulmonary/Chest: Effort normal and breath sounds normal. She has no wheezes.  Abdominal: She exhibits no distension and no mass.  Musculoskeletal:  She exhibits no edema.  Lymphadenopathy:    She has no cervical adenopathy.  Neurological: She is alert and oriented to person, place, and time.  Skin: Skin is warm and dry. No rash noted. She is not diaphoretic.  Psychiatric: Memory, affect and judgment normal.    Lab Results  Component Value Date   TSH 2.588 04/18/2013   Lab Results  Component Value Date   WBC 4.6 05/30/2013   HGB 12.2 05/30/2013   HCT 36.1 05/30/2013   MCV 119* 05/30/2013   PLT 497* 05/30/2013   Lab Results  Component Value Date   CREATININE 0.64 04/18/2013   BUN 16 04/18/2013   NA 138 04/18/2013   K 4.1 04/18/2013   CL 105 04/18/2013   CO2 31 04/18/2013   Lab Results  Component Value Date   ALT 10 02/21/2013   AST 18 02/21/2013   ALKPHOS 51 02/21/2013   BILITOT 0.3 02/21/2013   Lab Results  Component Value Date   CHOL 169 04/18/2013   Lab Results  Component Value Date   HDL 53 04/18/2013   Lab Results  Component Value Date   LDLCALC 98 04/18/2013   Lab Results  Component Value Date   TRIG 92 04/18/2013   Lab Results  Component Value Date   CHOLHDL 3.2 04/18/2013     Assessment & Plan  Hypothyroid Doing well no changes.   Decreased blood pressure, not hypotension Well controlled, no changes.   Other and unspecified hyperlipidemia Avoid trans fats, increase exercise   Depression with anxiety Doing better on Venlafaxine daily,

## 2013-06-01 NOTE — Assessment & Plan Note (Signed)
Well controlled, no changes 

## 2013-06-01 NOTE — Assessment & Plan Note (Signed)
Avoid trans fats, increase exercise

## 2013-06-01 NOTE — Assessment & Plan Note (Signed)
Doing well no changes 

## 2013-06-01 NOTE — Assessment & Plan Note (Signed)
Doing better on Venlafaxine daily,

## 2013-06-02 ENCOUNTER — Telehealth: Payer: Self-pay | Admitting: *Deleted

## 2013-06-02 DIAGNOSIS — M1990 Unspecified osteoarthritis, unspecified site: Secondary | ICD-10-CM

## 2013-06-02 NOTE — Telephone Encounter (Signed)
Faxed refill request received from pharmacy for Tylenol #3 Last filled by MD on 07.25.14, #[id number] Last AEX - 10.31.14 Next AEX - 3 Months Please Advise/SLS

## 2013-06-02 NOTE — Telephone Encounter (Signed)
OK to refill T & C #3 with same sig, same number

## 2013-06-03 LAB — TRANSFERRIN RECEPTOR, SOLUABLE: Transferrin Receptor, Soluble: 1.22 mg/L (ref 0.76–1.76)

## 2013-06-03 LAB — LACTATE DEHYDROGENASE: LDH: 165 U/L (ref 94–250)

## 2013-06-03 MED ORDER — ACETAMINOPHEN-CODEINE #3 300-30 MG PO TABS: 1.0000 | ORAL_TABLET | Freq: Three times a day (TID) | ORAL | Status: DC | PRN

## 2013-06-03 NOTE — Telephone Encounter (Signed)
Rx faxed to [facility]

## 2013-06-16 ENCOUNTER — Other Ambulatory Visit: Payer: Self-pay | Admitting: Family Medicine

## 2013-06-19 ENCOUNTER — Ambulatory Visit: Payer: BC Managed Care – PPO | Admitting: Physician Assistant

## 2013-06-23 ENCOUNTER — Encounter: Payer: Self-pay | Admitting: Family Medicine

## 2013-06-23 ENCOUNTER — Ambulatory Visit (INDEPENDENT_AMBULATORY_CARE_PROVIDER_SITE_OTHER): Payer: BC Managed Care – PPO | Admitting: Family Medicine

## 2013-06-23 VITALS — BP 118/70 | HR 77 | Temp 97.8°F | Ht 64.0 in | Wt 111.0 lb

## 2013-06-23 DIAGNOSIS — J209 Acute bronchitis, unspecified: Secondary | ICD-10-CM

## 2013-06-23 MED ORDER — HYDROCODONE-HOMATROPINE 5-1.5 MG/5ML PO SYRP: 5.0000 mL | ORAL_SOLUTION | Freq: Three times a day (TID) | ORAL | Status: DC | PRN

## 2013-06-23 MED ORDER — AMOXICILLIN-POT CLAVULANATE 875-125 MG PO TABS: 1.0000 | ORAL_TABLET | Freq: Two times a day (BID) | ORAL | Status: DC

## 2013-06-23 NOTE — Progress Notes (Signed)
Pre visit review using our clinic review tool, if applicable. No additional management support is needed unless otherwise documented below in the visit note. 

## 2013-06-23 NOTE — Progress Notes (Signed)
Patient ID: Rachael Johnson, female   DOB: 07/08/1951, 62 y.o.   MRN: [id number] KIEU QUIGGLE [id number] 1950-09-28 06/23/2013      Progress Note-Follow Up  Subjective  Chief Complaint  Chief Complaint  Patient presents with  . Sore Throat    X 2 weeks  . Cough    w/ phlegm (green)  . Generalized Body Aches    achy @ night    HPI  Patient is a 62 now female who is in today with 2 weeks with of worsening respiratory symptoms. She started with a sore throat but now has significant chest congestion. Her cough is of green phlegm and he is keeping her up at night frequently. She struggled ear pain and chills. His malaise and myalgias. She notes nausea but no vomiting. No chest pain or palpitations. Has used Mucinex DM with minimal improvement.  Past Medical History  Diagnosis Date  . Arthritis   . Allergy   . Thyroid disease   . Diverticulitis   . Frequent episodic tension-type headache   . Hypertension   . Seizure     childhood  . Anemia, iron deficiency 07/08/2012  . Depression with anxiety 03/24/2011  . Depression   . GERD (gastroesophageal reflux disease)   . Essential thrombocythemia 01/24/2011  . H. pylori infection 12/19/2012  . Other and unspecified hyperlipidemia 02/25/2013  . Preventative health care 04/20/2013  . Cerumen impaction 04/28/2013    Past Surgical History  Procedure Laterality Date  . Breast surgery  1992    biopsy, benign. Fibrocystic  . Abdominal hysterectomy      1992  . Umbilical hernia repair N/A 09/25/2012    Procedure: HERNIA REPAIR UMBILICAL ADULT;  Surgeon: Shelly Rubenstein, MD;  Location: [facility];  Service: General;  Laterality: N/A;    Family History  Problem Relation Age of Onset  . Arthritis Mother   . Cancer Mother     ovarian  . Hypertension Mother   . Heart disease Mother     pacer  . Heart failure Mother   . Arthritis Father   . Cancer Father     lung  . Hypertension Father   . Cancer Sister     lung  . Cancer Brother    prostate  . Cancer Brother     lung  . Hypertension Son   . COPD Brother   . Heart disease Brother   . Hypertension Brother   . Alcohol abuse Brother   . Cirrhosis Brother   . Seizures Sister     History   Social History  . Marital Status: Widowed    Spouse Name: N/A    Number of Children: 2  . Years of Education: N/A   Occupational History  . APL operator     [employer]   Social History Main Topics  . Smoking status: Former Smoker -- 3.50 packs/day for 5 years    Types: Cigarettes    Quit date: 07/31/1990  . Smokeless tobacco: Never Used  . Alcohol Use: No  . Drug Use: No  . Sexual Activity: No   Other Topics Concern  . Not on file   Social History Narrative   Regular exercise: no   Caffeine Use: 2-3 weekly    Current Outpatient Prescriptions on File Prior to Visit  Medication Sig Dispense Refill  . acetaminophen-codeine (TYLENOL #3) 300-30 MG per tablet Take 1 tablet by mouth 3 (three) times daily as needed.  65 tablet  3  .  ALPRAZolam (XANAX) 1 MG tablet Take 1 tablet (1 mg total) by mouth 3 (three) times daily as needed for sleep or anxiety.  90 tablet  3  . aspirin EC 81 MG tablet Take 81 mg by mouth every morning.      . citalopram (CELEXA) 40 MG tablet Take 1 tablet (40 mg total) by mouth daily. In am  90 tablet  3  . citalopram (CELEXA) 40 MG tablet TAKE 1 & 1/2 TABLETS BY MOUTH EVERY MORNING  45 tablet  0  . estradiol (ESTRACE) 0.5 MG tablet TAKE 1 TABLET DAILY  90 tablet  0  . fluconazole (DIFLUCAN) 150 MG tablet Take 1 tablet (150 mg total) by mouth once a week.  4 tablet  1  . fluorometholone (FML) 0.1 % ophthalmic suspension Place 1 drop into both eyes daily as needed (For dry eyes.).       Marland Kitchen furosemide (LASIX) 20 MG tablet Take 1 tablet (20 mg total) by mouth every morning.  90 tablet  2  . hydroxyurea (HYDREA) 500 MG capsule Take 1 capsule (500 mg total) by mouth daily after breakfast.  90 capsule  3  . levothyroxine (SYNTHROID, LEVOTHROID) 88 MCG  tablet TAKE 1 TABLET (88 MCG TOTAL)   BY MOUTH DAILY BEFORE BREAKFAST.  30 tablet  1  . loratadine (CLARITIN) 10 MG tablet Take 1 tablet (10 mg total) by mouth daily as needed for allergies. Pruritus, allergies  30 tablet  11  . Multiple Vitamin (MULTIVITAMIN PO) Take 1 tablet by mouth every morning.       . naproxen (NAPROSYN) 500 MG tablet TAKE ONE TABLET BY MOUTH TWICE A DAY WITH MEALS  180 tablet  1  . NEOMYCIN-POLYMYXIN-HYDROCORTISONE (CORTISPORIN) 1 % SOLN otic solution Place 3 drops into both ears at bedtime as needed. Wax, itching  10 mL  0  . omeprazole (PRILOSEC) 40 MG capsule TAKE ONE CAPSULE BY MOUTH ONE TIME DAILY  90 capsule  2  . prochlorperazine (COMPAZINE) 10 MG tablet Take 1 tablet (10 mg total) by mouth every 6 (six) hours as needed.  90 tablet  2  . ranitidine (ZANTAC) 300 MG tablet TAKE 1 TABLET (300 MG TOTAL)  BY MOUTH AT BEDTIME AS NEEDED FORHEARTBURN  30 tablet  4  . venlafaxine XR (EFFEXOR-XR) 150 MG 24 hr capsule Take 1 capsule (150 mg total) by mouth daily.  90 capsule  1  . Vitamin D, Ergocalciferol, (DRISDOL) 50000 UNITS CAPS TAKE 1 CAPSULE BY MOUTH EVERY OTHER WEEK AS INSTRUCTED  6 capsule  3   No current facility-administered medications on file prior to visit.    No Known Allergies  Review of Systems  Review of Systems  Constitutional: Positive for chills and malaise/fatigue. Negative for fever.  HENT: Positive for congestion and ear pain.   Eyes: Negative for discharge.  Respiratory: Positive for cough, sputum production and shortness of breath.   Cardiovascular: Negative for chest pain, palpitations and leg swelling.  Gastrointestinal: Negative for nausea, abdominal pain and diarrhea.  Genitourinary: Negative for dysuria.  Musculoskeletal: Positive for myalgias. Negative for falls.  Skin: Negative for rash.  Neurological: Negative for loss of consciousness and headaches.  Endo/Heme/Allergies: Negative for polydipsia.  Psychiatric/Behavioral: Negative  for depression and suicidal ideas. The patient is not nervous/anxious and does not have insomnia.     Objective  BP 118/70  Pulse 77  Temp(Src) 97.8 F (36.6 C) (Oral)  Ht 5\' 4"  (1.626 m)  Wt 111 lb 0.6 oz (  50.367 kg)  BMI 19.05 kg/m2  SpO2 94%  LMP 07/31/1990  Physical Exam  Physical Exam  Constitutional: She is oriented to person, place, and time and well-developed, well-nourished, and in no distress. No distress.  HENT:  Head: Normocephalic and atraumatic.  Eyes: Conjunctivae are normal.  Neck: Neck supple. No thyromegaly present.  Cardiovascular: Normal rate, regular rhythm and normal heart sounds.   No murmur heard. Pulmonary/Chest: Effort normal and breath sounds normal. She has no wheezes.  Abdominal: She exhibits no distension and no mass.  Musculoskeletal: She exhibits no edema.  Lymphadenopathy:    She has no cervical adenopathy.  Neurological: She is alert and oriented to person, place, and time.  Skin: Skin is warm and dry. No rash noted. She is not diaphoretic.  Psychiatric: Memory, affect and judgment normal.    Lab Results  Component Value Date   TSH 2.588 04/18/2013   Lab Results  Component Value Date   WBC 4.6 05/30/2013   HGB 12.2 05/30/2013   HCT 36.1 05/30/2013   MCV 119* 05/30/2013   PLT 497* 05/30/2013   Lab Results  Component Value Date   CREATININE 0.64 04/18/2013   BUN 16 04/18/2013   NA 138 04/18/2013   K 4.1 04/18/2013   CL 105 04/18/2013   CO2 31 04/18/2013   Lab Results  Component Value Date   ALT 10 02/21/2013   AST 18 02/21/2013   ALKPHOS 51 02/21/2013   BILITOT 0.3 02/21/2013   Lab Results  Component Value Date   CHOL 169 04/18/2013   Lab Results  Component Value Date   HDL 53 04/18/2013   Lab Results  Component Value Date   LDLCALC 98 04/18/2013   Lab Results  Component Value Date   TRIG 92 04/18/2013   Lab Results  Component Value Date   CHOLHDL 3.2 04/18/2013     Assessment & Plan  Acute bronchitis Started on  Augmentin, Mucinex and probiotics, given Hycodan to use for cough prn

## 2013-06-23 NOTE — Patient Instructions (Signed)
Start a probiotic such as Digestive Advantage or Align or a generic Increase fluids, and rest  Acute Bronchitis Bronchitis is inflammation of the airways that extend from the windpipe into the lungs (bronchi). The inflammation often causes mucus to develop. This leads to a cough, which is the most common symptom of bronchitis.  In acute bronchitis, the condition usually develops suddenly and goes away over time, usually in a couple weeks. Smoking, allergies, and asthma can make bronchitis worse. Repeated episodes of bronchitis may cause further lung problems.  CAUSES Acute bronchitis is most often caused by the same virus that causes a cold. The virus can spread from person to person (contagious).  SIGNS AND SYMPTOMS   Cough.   Fever.   Coughing up mucus.   Body aches.   Chest congestion.   Chills.   Shortness of breath.   Sore throat.  DIAGNOSIS  Acute bronchitis is usually diagnosed through a physical exam. Tests, such as chest X-rays, are sometimes done to rule out other conditions.  TREATMENT  Acute bronchitis usually goes away in a couple weeks. Often times, no medical treatment is necessary. Medicines are sometimes given for relief of fever or cough. Antibiotics are usually not needed but may be prescribed in certain situations. In some cases, an inhaler may be recommended to help reduce shortness of breath and control the cough. A cool mist vaporizer may also be used to help thin bronchial secretions and make it easier to clear the chest.  HOME CARE INSTRUCTIONS  Get plenty of rest.   Drink enough fluids to keep your urine clear or pale yellow (unless you have a medical condition that requires fluid restriction). Increasing fluids may help thin your secretions and will prevent dehydration.   Only take over-the-counter or prescription medicines as directed by your health care provider.   Avoid smoking and secondhand smoke. Exposure to cigarette smoke or  irritating chemicals will make bronchitis worse. If you are a smoker, consider using nicotine gum or skin patches to help control withdrawal symptoms. Quitting smoking will help your lungs heal faster.   Reduce the chances of another bout of acute bronchitis by washing your hands frequently, avoiding people with cold symptoms, and trying not to touch your hands to your mouth, nose, or eyes.   Follow up with your health care provider as directed.  SEEK MEDICAL CARE IF: Your symptoms do not improve after 1 week of treatment.  SEEK IMMEDIATE MEDICAL CARE IF:  You develop an increased fever or chills.   You have chest pain.   You have severe shortness of breath.  You have bloody sputum.   You develop dehydration.  You develop fainting.  You develop repeated vomiting.  You develop a severe headache. MAKE SURE YOU:   Understand these instructions.  Will watch your condition.  Will get help right away if you are not doing well or get worse. Document Released: 08/24/2004 Document Revised: 03/19/2013 Document Reviewed: 01/07/2013 [facility] Patient Information 2014 [place], [place].

## 2013-06-24 ENCOUNTER — Ambulatory Visit: Payer: BC Managed Care – PPO | Admitting: Family Medicine

## 2013-06-25 ENCOUNTER — Other Ambulatory Visit: Payer: Self-pay | Admitting: Family Medicine

## 2013-06-29 DIAGNOSIS — J209 Acute bronchitis, unspecified: Secondary | ICD-10-CM | POA: Insufficient documentation

## 2013-06-29 NOTE — Assessment & Plan Note (Signed)
Started on Augmentin, Mucinex and probiotics, given Hycodan to use for cough prn

## 2013-07-13 ENCOUNTER — Other Ambulatory Visit: Payer: Self-pay | Admitting: Family Medicine

## 2013-08-14 ENCOUNTER — Encounter: Payer: Self-pay | Admitting: Family Medicine

## 2013-08-14 ENCOUNTER — Telehealth: Payer: Self-pay

## 2013-08-14 ENCOUNTER — Ambulatory Visit (INDEPENDENT_AMBULATORY_CARE_PROVIDER_SITE_OTHER): Payer: BC Managed Care – PPO | Admitting: Family Medicine

## 2013-08-14 VITALS — BP 122/78 | HR 88 | Temp 98.1°F | Ht 63.5 in | Wt 116.1 lb

## 2013-08-14 DIAGNOSIS — E039 Hypothyroidism, unspecified: Secondary | ICD-10-CM

## 2013-08-14 DIAGNOSIS — K219 Gastro-esophageal reflux disease without esophagitis: Secondary | ICD-10-CM

## 2013-08-14 DIAGNOSIS — A048 Other specified bacterial intestinal infections: Secondary | ICD-10-CM

## 2013-08-14 DIAGNOSIS — F32A Depression, unspecified: Secondary | ICD-10-CM

## 2013-08-14 DIAGNOSIS — F329 Major depressive disorder, single episode, unspecified: Secondary | ICD-10-CM

## 2013-08-14 DIAGNOSIS — M159 Polyosteoarthritis, unspecified: Secondary | ICD-10-CM

## 2013-08-14 DIAGNOSIS — F419 Anxiety disorder, unspecified: Secondary | ICD-10-CM

## 2013-08-14 DIAGNOSIS — M129 Arthropathy, unspecified: Secondary | ICD-10-CM

## 2013-08-14 DIAGNOSIS — F341 Dysthymic disorder: Secondary | ICD-10-CM

## 2013-08-14 DIAGNOSIS — E785 Hyperlipidemia, unspecified: Secondary | ICD-10-CM

## 2013-08-14 DIAGNOSIS — M1990 Unspecified osteoarthritis, unspecified site: Secondary | ICD-10-CM

## 2013-08-14 MED ORDER — ALPRAZOLAM 1 MG PO TABS: 1.0000 mg | ORAL_TABLET | Freq: Three times a day (TID) | ORAL | Status: DC | PRN

## 2013-08-14 MED ORDER — ACETAMINOPHEN-CODEINE #3 300-30 MG PO TABS: 1.0000 | ORAL_TABLET | Freq: Four times a day (QID) | ORAL | Status: DC | PRN

## 2013-08-14 NOTE — Progress Notes (Signed)
Pre visit review using our clinic review tool, if applicable. No additional management support is needed unless otherwise documented below in the visit note. 

## 2013-08-14 NOTE — Telephone Encounter (Signed)
Pt states that md said she was going to put her back on a pill to help her indigestion? Pt thought she had another RX but she doesn't know where the refill is? Pt would like this send to [facility] in [place]?  Please advise?

## 2013-08-14 NOTE — Patient Instructions (Addendum)
Salon pas cream or Aspercreme Restart Omeprazole in am after H pylori breath test is done  Arthritis, Nonspecific Arthritis is inflammation of a joint. This usually means pain, redness, warmth or swelling are present. One or more joints may be involved. There are a number of types of arthritis. Your caregiver may not be able to tell what type of arthritis you have right away. CAUSES  The most common cause of arthritis is the wear and tear on the joint (osteoarthritis). This causes damage to the cartilage, which can break down over time. The knees, hips, back and neck are most often affected by this type of arthritis. Other types of arthritis and common causes of joint pain include:  Sprains and other injuries near the joint. Sometimes minor sprains and injuries cause pain and swelling that develop hours later.  Rheumatoid arthritis. This affects hands, feet and knees. It usually affects both sides of your body at the same time. It is often associated with chronic ailments, fever, weight loss and general weakness.  Crystal arthritis. Gout and pseudo gout can cause occasional acute severe pain, redness and swelling in the foot, ankle, or knee.  Infectious arthritis. Bacteria can get into a joint through a break in overlying skin. This can cause infection of the joint. Bacteria and viruses can also spread through the blood and affect your joints.  Drug, infectious and allergy reactions. Sometimes joints can become mildly painful and slightly swollen with these types of illnesses. SYMPTOMS   Pain is the main symptom.  Your joint or joints can also be red, swollen and warm or hot to the touch.  You may have a fever with certain types of arthritis, or even feel overall ill.  The joint with arthritis will hurt with movement. Stiffness is present with some types of arthritis. DIAGNOSIS  Your caregiver will suspect arthritis based on your description of your symptoms and on your exam. Testing may  be needed to find the type of arthritis:  Blood and sometimes urine tests.  X-ray tests and sometimes CT or MRI scans.  Removal of fluid from the joint (arthrocentesis) is done to check for bacteria, crystals or other causes. Your caregiver (or a specialist) will numb the area over the joint with a local anesthetic, and use a needle to remove joint fluid for examination. This procedure is only minimally uncomfortable.  Even with these tests, your caregiver may not be able to tell what kind of arthritis you have. Consultation with a specialist (rheumatologist) may be helpful. TREATMENT  Your caregiver will discuss with you treatment specific to your type of arthritis. If the specific type cannot be determined, then the following general recommendations may apply. Treatment of severe joint pain includes:  Rest.  Elevation.  Anti-inflammatory medication (for example, ibuprofen) may be prescribed. Avoiding activities that cause increased pain.  Only take over-the-counter or prescription medicines for pain and discomfort as recommended by your caregiver.  Cold packs over an inflamed joint may be used for 10 to 15 minutes every hour. Hot packs sometimes feel better, but do not use overnight. Do not use hot packs if you are diabetic without your caregiver's permission.  A cortisone shot into arthritic joints may help reduce pain and swelling.  Any acute arthritis that gets worse over the next 1 to 2 days needs to be looked at to be sure there is no joint infection. Long-term arthritis treatment involves modifying activities and lifestyle to reduce joint stress jarring. This can include weight loss.  Also, exercise is needed to nourish the joint cartilage and remove waste. This helps keep the muscles around the joint strong. HOME CARE INSTRUCTIONS   Do not take aspirin to relieve pain if gout is suspected. This elevates uric acid levels.  Only take over-the-counter or prescription medicines for  pain, discomfort or fever as directed by your caregiver.  Rest the joint as much as possible.  If your joint is swollen, keep it elevated.  Use crutches if the painful joint is in your leg.  Drinking plenty of fluids may help for certain types of arthritis.  Follow your caregiver's dietary instructions.  Try low-impact exercise such as:  Swimming.  Water aerobics.  Biking.  Walking.  Morning stiffness is often relieved by a warm shower.  Put your joints through regular range-of-motion. SEEK MEDICAL CARE IF:   You do not feel better in 24 hours or are getting worse.  You have side effects to medications, or are not getting better with treatment. SEEK IMMEDIATE MEDICAL CARE IF:   You have a fever.  You develop severe joint pain, swelling or redness.  Many joints are involved and become painful and swollen.  There is severe back pain and/or leg weakness.  You have loss of bowel or bladder control. Document Released: 08/24/2004 Document Revised: 10/09/2011 Document Reviewed: 09/09/2008 [facility] Patient Information 2014 [place].

## 2013-08-15 ENCOUNTER — Other Ambulatory Visit: Payer: Self-pay | Admitting: Family Medicine

## 2013-08-15 LAB — H. PYLORI BREATH TEST: H. pylori Breath Test: NEGATIVE

## 2013-08-16 NOTE — Telephone Encounter (Signed)
H pylori was negative so can restart Omeprazole 40 mg po daily disp #30 with 3 rf

## 2013-08-17 ENCOUNTER — Encounter: Payer: Self-pay | Admitting: Family Medicine

## 2013-08-17 DIAGNOSIS — K219 Gastro-esophageal reflux disease without esophagitis: Secondary | ICD-10-CM

## 2013-08-17 HISTORY — DX: Gastro-esophageal reflux disease without esophagitis: K21.9

## 2013-08-17 NOTE — Assessment & Plan Note (Signed)
With some epigastric discomfort. Avoid offending foods, start probiotics, will proceed with H Pylori testing. Can restart PPI once testing done

## 2013-08-17 NOTE — Assessment & Plan Note (Signed)
Has seen Dr Marlou Sa of Ortho, encouraged daily exercise, may continue pain meds prn.

## 2013-08-17 NOTE — Progress Notes (Signed)
Patient ID: Rachael Johnson, female   DOB: [birth year], 63 y.o.   MRN: [id number] Rachael Johnson [id number] April 08, 1951 08/17/2013      Progress Note-Follow Up  Subjective  Chief Complaint  Chief Complaint  Patient presents with  . Follow-up    3 month    HPI  The patient is a 63 year old Caucasian female who is in today for followup. She continues to struggle with diffuse pain. His hips, knees and small joint pain as well. Bilateral. No redness or warmth. She continues to struggle with anorexia and epigastric discomfort and dyspepsia have returned. Denies sore throat, chest pain, palpitations or shortness of breath. No GU concerns. Taking meds as prescribed  Past Medical History  Diagnosis Date  . Arthritis   . Allergy   . Thyroid disease   . Diverticulitis   . Frequent episodic tension-type headache   . Hypertension   . Seizure     childhood  . Anemia, iron deficiency 07/08/2012  . Depression with anxiety 03/24/2011  . Depression   . GERD (gastroesophageal reflux disease)   . Essential thrombocythemia 01/24/2011  . H. pylori infection 12/19/2012  . Other and unspecified hyperlipidemia 02/25/2013  . Preventative health care 04/20/2013  . Cerumen impaction 04/28/2013  . 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 Esophageal reflux 08/17/2013    Past Surgical History  Procedure Laterality Date  . Breast surgery  1992    biopsy, benign. Fibrocystic  . Abdominal hysterectomy      1992  . Umbilical hernia repair N/A 09/25/2012    Procedure: HERNIA REPAIR UMBILICAL ADULT;  Surgeon: Harl Bowie, MD;  Location: [facility];  Service: General;  Laterality: N/A;    Family History  Problem Relation Age of Onset  . Arthritis Mother   . Cancer Mother     ovarian  . Hypertension Mother   . Heart disease Mother     pacer  . Heart failure Mother   . Arthritis Father   . Cancer Father     lung  . Hypertension Father   . Cancer Sister     lung  . Cancer Brother     prostate  . Cancer Brother      lung  . Hypertension Son   . COPD Brother   . Heart disease Brother   . Hypertension Brother   . Alcohol abuse Brother   . Cirrhosis Brother   . Seizures Sister     History   Social History  . Marital Status: Widowed    Spouse Name: N/A    Number of Children: 2  . Years of Education: N/A   Occupational History  . APL operator     [employer]   Social History Main Topics  . Smoking status: Former Smoker -- 3.50 packs/day for 5 years    Types: Cigarettes    Quit date: 07/31/1990  . Smokeless tobacco: Never Used  . Alcohol Use: No  . Drug Use: No  . Sexual Activity: No   Other Topics Concern  . Not on file   Social History Narrative   Regular exercise: no   Caffeine Use: 2-3 weekly    Current Outpatient Prescriptions on File Prior to Visit  Medication Sig Dispense Refill  . aspirin EC 81 MG tablet Take 81 mg by mouth every morning.      . citalopram (CELEXA) 40 MG tablet Take 1 tablet (40 mg total) by mouth daily. In am  90 tablet  3  . estradiol (ESTRACE)  0.5 MG tablet TAKE 1 TABLET DAILY  90 tablet  0  . fluorometholone (FML) 0.1 % ophthalmic suspension Place 1 drop into both eyes daily as needed (For dry eyes.).       Marland Kitchen furosemide (LASIX) 20 MG tablet Take 1 tablet (20 mg total) by mouth every morning.  90 tablet  2  . HYDROcodone-homatropine (HYCODAN) 5-1.5 MG/5ML syrup Take 5 mLs by mouth every 8 (eight) hours as needed for cough.  120 mL  0  . hydroxyurea (HYDREA) 500 MG capsule Take 1 capsule (500 mg total) by mouth daily after breakfast.  90 capsule  3  . levothyroxine (SYNTHROID, LEVOTHROID) 88 MCG tablet TAKE 1 TABLET (88 MCG TOTAL)    BY MOUTH DAILY BEFORE BREAKFAST.  30 tablet  2  . loratadine (CLARITIN) 10 MG tablet Take 1 tablet (10 mg total) by mouth daily as needed for allergies. Pruritus, allergies  30 tablet  11  . Multiple Vitamin (MULTIVITAMIN PO) Take 1 tablet by mouth every morning.       . naproxen (NAPROSYN) 500 MG tablet TAKE ONE TABLET BY  MOUTH TWICE A DAY WITH MEALS  180 tablet  1  . NEOMYCIN-POLYMYXIN-HYDROCORTISONE (CORTISPORIN) 1 % SOLN otic solution Place 3 drops into both ears at bedtime as needed. Wax, itching  10 mL  0  . omeprazole (PRILOSEC) 40 MG capsule TAKE ONE CAPSULE BY MOUTH ONE TIME DAILY  90 capsule  2  . prochlorperazine (COMPAZINE) 10 MG tablet Take 1 tablet (10 mg total) by mouth every 6 (six) hours as needed.  90 tablet  2  . ranitidine (ZANTAC) 300 MG tablet TAKE 1 TABLET (300 MG TOTAL)  BY MOUTH AT BEDTIME AS NEEDED FORHEARTBURN  30 tablet  4  . Vitamin D, Ergocalciferol, (DRISDOL) 50000 UNITS CAPS TAKE 1 CAPSULE BY MOUTH EVERY OTHER WEEK AS INSTRUCTED  6 capsule  3   No current facility-administered medications on file prior to visit.    No Known Allergies  Review of Systems  Review of Systems  Constitutional: Negative for fever and malaise/fatigue.  HENT: Negative for congestion.   Eyes: Negative for discharge.  Respiratory: Negative for shortness of breath.   Cardiovascular: Negative for chest pain, palpitations and leg swelling.  Gastrointestinal: Positive for heartburn and abdominal pain. Negative for nausea and diarrhea.  Genitourinary: Negative for dysuria.  Musculoskeletal: Positive for back pain, joint pain and neck pain. Negative for falls.  Skin: Negative for rash.  Neurological: Negative for loss of consciousness and headaches.  Endo/Heme/Allergies: Negative for polydipsia.  Psychiatric/Behavioral: Negative for depression and suicidal ideas. The patient is not nervous/anxious and does not have insomnia.     Objective  BP 122/78  Pulse 88  Temp(Src) 98.1 F (36.7 C) (Oral)  Ht 5' 3.5" (1.613 m)  Wt 116 lb 1.3 oz (52.654 kg)  BMI 20.24 kg/m2  SpO2 97%  LMP 07/31/1990  Physical Exam  Physical Exam  Constitutional: She is oriented to person, place, and time and well-developed, well-nourished, and in no distress. No distress.  HENT:  Head: Normocephalic and atraumatic.   Eyes: Conjunctivae are normal.  Neck: Neck supple. No thyromegaly present.  Cardiovascular: Normal rate, regular rhythm and normal heart sounds.   No murmur heard. Pulmonary/Chest: Effort normal and breath sounds normal. She has no wheezes.  Abdominal: She exhibits no distension and no mass.  Musculoskeletal: She exhibits no edema.  Lymphadenopathy:    She has no cervical adenopathy.  Neurological: She is alert and oriented to  person, place, and time.  Skin: Skin is warm and dry. No rash noted. She is not diaphoretic.  Psychiatric: Memory, affect and judgment normal.    Lab Results  Component Value Date   TSH 2.588 04/18/2013   Lab Results  Component Value Date   WBC 4.6 05/30/2013   HGB 12.2 05/30/2013   HCT 36.1 05/30/2013   MCV 119* 05/30/2013   PLT 497* 05/30/2013   Lab Results  Component Value Date   CREATININE 0.64 04/18/2013   BUN 16 04/18/2013   NA 138 04/18/2013   K 4.1 04/18/2013   CL 105 04/18/2013   CO2 31 04/18/2013   Lab Results  Component Value Date   ALT 10 02/21/2013   AST 18 02/21/2013   ALKPHOS 51 02/21/2013   BILITOT 0.3 02/21/2013   Lab Results  Component Value Date   CHOL 169 04/18/2013   Lab Results  Component Value Date   HDL 53 04/18/2013   Lab Results  Component Value Date   LDLCALC 98 04/18/2013   Lab Results  Component Value Date   TRIG 92 04/18/2013   Lab Results  Component Value Date   CHOLHDL 3.2 04/18/2013     Assessment & Plan  Generalized OA Has seen Dr Marlou Sa of Ortho, encouraged daily exercise, may continue pain meds prn.  Hypothyroid Continue Levothyroxine at same dose  Other and unspecified hyperlipidemia Well controlled, avoid trans fats  Esophageal reflux With some epigastric discomfort. Avoid offending foods, start probiotics, will proceed with H Pylori testing. Can restart PPI once testing done

## 2013-08-17 NOTE — Assessment & Plan Note (Signed)
Continue Levothyroxine at same dose

## 2013-08-17 NOTE — Assessment & Plan Note (Signed)
Well controlled, avoid trans fats 

## 2013-08-18 MED ORDER — OMEPRAZOLE 40 MG PO CPDR: 40.0000 mg | DELAYED_RELEASE_CAPSULE | Freq: Every day | ORAL | Status: DC

## 2013-08-18 NOTE — Telephone Encounter (Signed)
Left a message for patient to return my call. 

## 2013-08-19 NOTE — Telephone Encounter (Signed)
Notified pt. Rx was sent on 08/18/13.

## 2013-08-20 ENCOUNTER — Telehealth: Payer: Self-pay | Admitting: *Deleted

## 2013-08-20 NOTE — Telephone Encounter (Signed)
Pt left message on 08/19/13 that she cannot find previous omeprazole Rx and is requesting recent H.Pylori result. Spoke with [facility] and verified receipt of 08/18/13 rx but insurance will not pay until February as pt picked up 90 day supply in November.  Attempted to reach pt and left message to return my call.

## 2013-08-21 ENCOUNTER — Other Ambulatory Visit: Payer: Self-pay | Admitting: *Deleted

## 2013-08-21 DIAGNOSIS — D539 Nutritional anemia, unspecified: Secondary | ICD-10-CM

## 2013-08-21 NOTE — Telephone Encounter (Signed)
Patient called and was given results. Patient was also told that she could not get her omeprazole filled until February dur to insurance. Patient fully understood.

## 2013-08-22 ENCOUNTER — Ambulatory Visit (HOSPITAL_BASED_OUTPATIENT_CLINIC_OR_DEPARTMENT_OTHER): Payer: BC Managed Care – PPO | Admitting: Hematology & Oncology

## 2013-08-22 ENCOUNTER — Other Ambulatory Visit (HOSPITAL_BASED_OUTPATIENT_CLINIC_OR_DEPARTMENT_OTHER): Payer: BC Managed Care – PPO | Admitting: Lab

## 2013-08-22 VITALS — BP 121/71 | HR 77 | Temp 97.7°F | Resp 20 | Ht 63.5 in | Wt 115.0 lb

## 2013-08-22 DIAGNOSIS — D509 Iron deficiency anemia, unspecified: Secondary | ICD-10-CM

## 2013-08-22 DIAGNOSIS — D473 Essential (hemorrhagic) thrombocythemia: Secondary | ICD-10-CM

## 2013-08-22 DIAGNOSIS — D539 Nutritional anemia, unspecified: Secondary | ICD-10-CM

## 2013-08-22 LAB — CBC WITH DIFFERENTIAL (CANCER CENTER ONLY)
BASO#: 0 10*3/uL (ref 0.0–0.2)
BASO%: 0.6 % (ref 0.0–2.0)
EOS%: 5.3 % (ref 0.0–7.0)
Eosinophils Absolute: 0.2 10*3/uL (ref 0.0–0.5)
HCT: 36.6 % (ref 34.8–46.6)
HGB: 12 g/dL (ref 11.6–15.9)
LYMPH#: 0.8 10*3/uL — ABNORMAL LOW (ref 0.9–3.3)
LYMPH%: 24.3 % (ref 14.0–48.0)
MCH: 38.7 pg — ABNORMAL HIGH (ref 26.0–34.0)
MCHC: 32.8 g/dL (ref 32.0–36.0)
MCV: 118 fL — ABNORMAL HIGH (ref 81–101)
MONO#: 0.3 10*3/uL (ref 0.1–0.9)
MONO%: 7.8 % (ref 0.0–13.0)
NEUT#: 2 10*3/uL (ref 1.5–6.5)
NEUT%: 62 % (ref 39.6–80.0)
Platelets: 497 10*3/uL — ABNORMAL HIGH (ref 145–400)
RBC: 3.1 10*6/uL — ABNORMAL LOW (ref 3.70–5.32)
RDW: 12 % (ref 11.1–15.7)
WBC: 3.2 10*3/uL — ABNORMAL LOW (ref 3.9–10.0)

## 2013-08-22 LAB — IRON AND TIBC CHCC
%SAT: 56 % (ref 21–57)
Iron: 120 ug/dL (ref 41–142)
TIBC: 215 ug/dL — ABNORMAL LOW (ref 236–444)
UIBC: 95 ug/dL — ABNORMAL LOW (ref 120–384)

## 2013-08-22 LAB — FERRITIN CHCC: Ferritin: 325 ng/ml — ABNORMAL HIGH (ref 9–269)

## 2013-08-22 NOTE — Progress Notes (Signed)
This office note has been dictated.

## 2013-08-23 NOTE — Progress Notes (Signed)
DIAGNOSES: 1. Essential thrombocythemia - JAK2 negative. 2. Intermittent iron-deficiency anemia.  CURRENT THERAPY: 1. Hydrea 500 mg p.o. daily. 2. IV iron as indicated.  INTERIM HISTORY:  [name] comes in for followup.  She is still having a tough time.  It has been almost 3 years since her husband passed away. The holidays were very difficult for her.  Thankfully, one of her son is going to have another grandchild.  She is looking forward to this.  She is still working.  She was out for the Christmas Holidays.  She is back to work now.  She has had no problems with headaches.  She has had no problems with nausea or vomiting.  There has been no change in bowel or bladder habits.  Her last mammogram was done back in September of this past year, and everything looked okay.  PHYSICAL EXAMINATION:  General:  This is a thin white female in no obvious distress.  Vital Signs:  Temperature of 97.7, pulse 77, respiratory rate 20, blood pressure 121/71, and weight is 150 pounds. Head and Neck:  Normocephalic, atraumatic skull.  There are no ocular or oral lesions.  There are no palpable cervical or supraclavicular lymph nodes.  Lungs:  Clear bilaterally.  Cardiac:  Regular rate and rhythm with a normal S1 and S2.  There are no murmurs, rubs, or bruits. Abdomen:  Soft.  She has good bowel sounds.  There is no palpable abdominal mass.  There is no palpable hepatosplenomegaly.  Extremities: No clubbing, cyanosis, or edema.  Neurological:  No focal neurological deficits.  LABORATORY STUDIES:  White cell count is 3.2, hemoglobin is 12, hematocrit 37, and platelet count 497.  IMPRESSION:  Rachael Johnson is a nice 63 year old white female.  She has essential thrombocythemia.  She is JAK2 negative.  I think I may run a calcitonin assay on her.  We will do this next time when she comes in.  I suspect that she is probably going to be positive.  We will plan to get her back in another 3 months.   She is on Hydrea and doing fairly well with Hydrea.   ______________________________ Rachael Johnson, M.D. PRE/MEDQ  D:  08/22/2013  T:  08/23/2013  Job:  [id number]

## 2013-09-19 ENCOUNTER — Ambulatory Visit (INDEPENDENT_AMBULATORY_CARE_PROVIDER_SITE_OTHER): Payer: BC Managed Care – PPO | Admitting: Family Medicine

## 2013-09-19 ENCOUNTER — Encounter: Payer: Self-pay | Admitting: Family Medicine

## 2013-09-19 VITALS — BP 124/78 | HR 72 | Temp 98.3°F | Resp 14 | Ht 63.5 in | Wt 117.2 lb

## 2013-09-19 DIAGNOSIS — M159 Polyosteoarthritis, unspecified: Secondary | ICD-10-CM

## 2013-09-19 DIAGNOSIS — F341 Dysthymic disorder: Secondary | ICD-10-CM

## 2013-09-19 DIAGNOSIS — D473 Essential (hemorrhagic) thrombocythemia: Secondary | ICD-10-CM

## 2013-09-19 DIAGNOSIS — R634 Abnormal weight loss: Secondary | ICD-10-CM

## 2013-09-19 DIAGNOSIS — K219 Gastro-esophageal reflux disease without esophagitis: Secondary | ICD-10-CM

## 2013-09-19 DIAGNOSIS — F418 Other specified anxiety disorders: Secondary | ICD-10-CM

## 2013-09-19 DIAGNOSIS — D539 Nutritional anemia, unspecified: Secondary | ICD-10-CM

## 2013-09-19 MED ORDER — OMEPRAZOLE 40 MG PO CPDR: 40.0000 mg | DELAYED_RELEASE_CAPSULE | Freq: Two times a day (BID) | ORAL | Status: DC

## 2013-09-19 NOTE — Progress Notes (Signed)
Patient ID: Rachael Johnson, female   DOB: 03/18/1951, 63 y.o.   MRN: [id number] BRICEYDA ABDULLAH [id number] 03-Jun-1951 09/19/2013      Progress Note-Follow Up  Subjective  Chief Complaint  Chief Complaint  Patient presents with  . Follow-up    5-wk [Osteoarthritis; GERD; Hypothyroid; Hyperlipid]    HPI  Patient is a 63 year old Caucasian female who is in today for routine followup. She is struggling with depression do to reverse her husband's death but does feel overall she's doing well since. Does feel like he just been in medications have been helpful in getting her through this. She continues to struggle with some heartburn in the morning despite taking ranitidine and omeprazole. Has been trying to avoid offending foods. Continues to struggle with stiffness and pain in her knees most notably along the medial aspect. No redness or warmth. Naproxen is partially helpful. No chest pain, palpitations or shortness or breath is noted. He is taking medications as prescribed  Past Medical History  Diagnosis Date  . Arthritis   . Allergy   . Thyroid disease   . Diverticulitis   . Frequent episodic tension-type headache   . Hypertension   . Seizure     childhood  . Anemia, iron deficiency 07/08/2012  . Depression with anxiety 03/24/2011  . Depression   . GERD (gastroesophageal reflux disease)   . Essential thrombocythemia 01/24/2011  . H. pylori infection 12/19/2012  . Other and unspecified hyperlipidemia 02/25/2013  . Preventative health care 04/20/2013  . Cerumen impaction 04/28/2013  . 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 Esophageal reflux 08/17/2013    Past Surgical History  Procedure Laterality Date  . Breast surgery  1992    biopsy, benign. Fibrocystic  . Abdominal hysterectomy      1992  . Umbilical hernia repair N/A 09/25/2012    Procedure: HERNIA REPAIR UMBILICAL ADULT;  Surgeon: Harl Bowie, MD;  Location: [facility];  Service: General;  Laterality: N/A;    Family History  Problem  Relation Age of Onset  . Arthritis Mother   . Cancer Mother     ovarian  . Hypertension Mother   . Heart disease Mother     pacer  . Heart failure Mother   . Arthritis Father   . Cancer Father     lung  . Hypertension Father   . Cancer Sister     lung  . Cancer Brother     prostate  . Cancer Brother     lung  . Hypertension Son   . COPD Brother   . Heart disease Brother   . Hypertension Brother   . Alcohol abuse Brother   . Cirrhosis Brother   . Seizures Sister     History   Social History  . Marital Status: Widowed    Spouse Name: N/A    Number of Children: 2  . Years of Education: N/A   Occupational History  . APL operator     [employer]   Social History Main Topics  . Smoking status: Former Smoker -- 3.50 packs/day for 5 years    Types: Cigarettes    Quit date: 07/31/1990  . Smokeless tobacco: Never Used  . Alcohol Use: No  . Drug Use: No  . Sexual Activity: No   Other Topics Concern  . Not on file   Social History Narrative   Regular exercise: no   Caffeine Use: 2-3 weekly    Current Outpatient Prescriptions on File Prior to Visit  Medication Sig Dispense Refill  . acetaminophen-codeine (TYLENOL #3) 300-30 MG per tablet Take 1 tablet by mouth every 6 (six) hours as needed for moderate pain or severe pain.  90 tablet  3  . ALPRAZolam (XANAX) 1 MG tablet Take 1 tablet (1 mg total) by mouth 3 (three) times daily as needed for sleep or anxiety.  90 tablet  3  . aspirin EC 81 MG tablet Take 81 mg by mouth every morning.      . citalopram (CELEXA) 40 MG tablet Take 1 tablet (40 mg total) by mouth daily. In am  90 tablet  3  . estradiol (ESTRACE) 0.5 MG tablet TAKE 1 TABLET DAILY  90 tablet  0  . fluorometholone (FML) 0.1 % ophthalmic suspension Place 1 drop into both eyes daily as needed (For dry eyes.).       Marland Kitchen furosemide (LASIX) 20 MG tablet Take 1 tablet (20 mg total) by mouth every morning.  90 tablet  2  . hydroxyurea (HYDREA) 500 MG capsule Take 1  capsule (500 mg total) by mouth daily after breakfast.  90 capsule  3  . levothyroxine (SYNTHROID, LEVOTHROID) 88 MCG tablet TAKE 1 TABLET (88 MCG TOTAL)    BY MOUTH DAILY BEFORE BREAKFAST.  30 tablet  2  . loratadine (CLARITIN) 10 MG tablet Take 1 tablet (10 mg total) by mouth daily as needed for allergies. Pruritus, allergies  30 tablet  11  . Multiple Vitamin (MULTIVITAMIN PO) Take 1 tablet by mouth every morning.       . naproxen (NAPROSYN) 500 MG tablet TAKE ONE TABLET BY MOUTH TWICE A DAY WITH MEALS  180 tablet  1  . NEOMYCIN-POLYMYXIN-HYDROCORTISONE (CORTISPORIN) 1 % SOLN otic solution Place 3 drops into both ears at bedtime as needed. Wax, itching  10 mL  0  . prochlorperazine (COMPAZINE) 10 MG tablet Take 1 tablet (10 mg total) by mouth every 6 (six) hours as needed.  90 tablet  2  . ranitidine (ZANTAC) 300 MG tablet TAKE 1 TABLET (300 MG TOTAL)  BY MOUTH AT BEDTIME AS NEEDED FORHEARTBURN  30 tablet  4  . venlafaxine XR (EFFEXOR-XR) 150 MG 24 hr capsule TAKE ONE CAPSULE BY MOUTH ONE TIME DAILY  90 capsule  0  . Vitamin D, Ergocalciferol, (DRISDOL) 50000 UNITS CAPS TAKE 1 CAPSULE BY MOUTH EVERY OTHER WEEK AS INSTRUCTED  6 capsule  3   No current facility-administered medications on file prior to visit.    No Known Allergies  Review of Systems  Review of Systems  Constitutional: Negative for fever and malaise/fatigue.  HENT: Negative for congestion.   Eyes: Negative for discharge.  Respiratory: Negative for shortness of breath.   Cardiovascular: Negative for chest pain, palpitations and leg swelling.  Gastrointestinal: Negative for nausea, abdominal pain and diarrhea.  Genitourinary: Negative for dysuria.  Musculoskeletal: Negative for falls.  Skin: Negative for rash.  Neurological: Negative for loss of consciousness and headaches.  Endo/Heme/Allergies: Negative for polydipsia.  Psychiatric/Behavioral: Negative for depression and suicidal ideas. The patient is not nervous/anxious  and does not have insomnia.     Objective  BP 124/78  Pulse 72  Temp(Src) 98.3 F (36.8 C) (Oral)  Resp 14  Ht 5' 3.5" (1.613 m)  Wt 117 lb 4 oz (53.184 kg)  BMI 20.44 kg/m2  SpO2 98%  LMP 07/31/1990  Physical Exam  Physical Exam  Constitutional: She is oriented to person, place, and time and well-developed, well-nourished, and in no distress. No distress.  HENT:  Head: Normocephalic and atraumatic.  Eyes: Conjunctivae are normal.  Neck: Neck supple. No thyromegaly present.  Cardiovascular: Normal rate, regular rhythm and normal heart sounds.   No murmur heard. Pulmonary/Chest: Effort normal and breath sounds normal. She has no wheezes.  Abdominal: She exhibits no distension and no mass.  Musculoskeletal: She exhibits no edema.  Lymphadenopathy:    She has no cervical adenopathy.  Neurological: She is alert and oriented to person, place, and time.  Skin: Skin is warm and dry. No rash noted. She is not diaphoretic.  Psychiatric: Memory, affect and judgment normal.    Lab Results  Component Value Date   TSH 2.588 04/18/2013   Lab Results  Component Value Date   WBC 3.2* 08/22/2013   HGB 12.0 08/22/2013   HCT 36.6 08/22/2013   MCV 118* 08/22/2013   PLT 497* 08/22/2013   Lab Results  Component Value Date   CREATININE 0.64 04/18/2013   BUN 16 04/18/2013   NA 138 04/18/2013   K 4.1 04/18/2013   CL 105 04/18/2013   CO2 31 04/18/2013   Lab Results  Component Value Date   ALT 10 02/21/2013   AST 18 02/21/2013   ALKPHOS 51 02/21/2013   BILITOT 0.3 02/21/2013   Lab Results  Component Value Date   CHOL 169 04/18/2013   Lab Results  Component Value Date   HDL 53 04/18/2013   Lab Results  Component Value Date   LDLCALC 98 04/18/2013   Lab Results  Component Value Date   TRIG 92 04/18/2013   Lab Results  Component Value Date   CHOLHDL 3.2 04/18/2013     Assessment & Plan  Weight loss Improved some, she is eating well, no changes for now  Depression with  anxiety She feels the Citalopram is helping with increased strength, no changes.  Essential thrombocythemia Is following with hematology, stable   Macrocytic anemia Resolved at present  Esophageal reflux Avoid offending foods, continue Ranitidine, Omeprazole but increase Omeprazole to bid temporarily to see if that helps. 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 Will try topical treatments such as [facility] or Aspercreme bid and

## 2013-09-19 NOTE — Assessment & Plan Note (Signed)
Resolved at present.

## 2013-09-19 NOTE — Assessment & Plan Note (Signed)
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 Will try topical treatments such as [facility] or Aspercreme bid and

## 2013-09-19 NOTE — Assessment & Plan Note (Signed)
Is following with hematology, stable

## 2013-09-19 NOTE — Progress Notes (Signed)
Pre visit review using our clinic review tool, if applicable. No additional management support is needed unless otherwise documented below in the visit note/SLS  

## 2013-09-19 NOTE — Assessment & Plan Note (Signed)
She feels the Citalopram is helping with increased strength, no changes.

## 2013-09-19 NOTE — Assessment & Plan Note (Signed)
Avoid offending foods, continue Ranitidine, Omeprazole but increase Omeprazole to bid temporarily to see if that helps. Add a probiotic daily

## 2013-09-19 NOTE — Patient Instructions (Addendum)
Gel over the counter include Aspercreme/generic, Salon Pas cream, Icy Hot cream to the knees  Krill or fish oil caps daily  Knee, Cartilage and its Function The menisci are made of tough cartilage, and fit between the surfaces of the bones upon which they rest. The menisci are semi lunar (C-shaped) and have a wedged profile. The wedged profile helps the stability of the joint by keeping the rounded femur surface from sliding off the flat tibial surface. The menisci are nourished (fed) by small blood vessels; but there is also a large area in the center of the meniscus that does not have a good blood supply (avascular). This presents a problem when there is an injury to the meniscus, as areas without good blood supply heal poorly. When there is a torn cartilage in the knee, surgery is often required to fix this. This is usually done with an arthroscopy (a surgical procedure less invasive than open surgery). Some times open surgery of the knee is required if there is other damage which has occurred. The medial meniscus rests on the medial tibial plateau. The tibia is the large bone in your lower leg (the shin bone). The medial tibial plateau is the upper end of the bone making up the inner part of your knee. The lateral meniscus serves the same purpose and is located on the outside of the knee. The menisci help to distribute your body weight across the knee joint. Without the meniscus present, the weight of your body would be unevenly applied to the bones in your legs (the femur and tibia). The femur is the large bone in your thigh. This uneven weight distribution would cause increased wear and tear on the cartilage covering the bones, leading to early damage of these areas. The presence of the menisci cartilage is necessary for a healthy knee. PURPOSE OF THE KNEE CARTILAGE The knee joint is made up of three bones: the thigh bone (femur), the shin bone (tibia), and the knee cap (patella). The surfaces of these  bones at the knee joint are covered with cartilage. This smooth, slippery surface allows the bones to slide against each other without causing bone damage. The meniscus sits between these cartilaginous surfaces of the bones (femur and tibia). It distributes the weight evenly in the joints and helps with the stability of the joint (keeps the joint steady). HOME CARE INSTRUCTIONS After surgery:  Use crutches as instructed.  Once home, an ice pack applied to your injury may help with discomfort and keep the swelling down. An ice pack can be used for 15-20 minutes 03-04 times per day for the first 2-3 days or as instructed.  Only take over-the-counter or prescription medicines for pain, discomfort, or fever as directed by your caregiver.  Call if you do not have relief of pain with medications or if there is an increase in pain.  Call if your foot becomes cold or blue.  Call if your knee gets stuck in a fixed position and will not move.  You may resume normal diet and activities as directed.  Make sure to keep your appointment with your follow-up caregiver. This injury may require further evaluation and treatment beyond the treatment you received today. MAKE SURE YOU:   Understand these instructions.  Will watch your condition.  Will get help right away if you are not doing well or get worse. Document Released: 01/06/2002 Document Revised: 10/09/2011 Document Reviewed: 05/19/2009 [facility] Patient Information 2014 [place].

## 2013-09-19 NOTE — Assessment & Plan Note (Signed)
Improved some, she is eating well, no changes for now

## 2013-09-30 ENCOUNTER — Other Ambulatory Visit: Payer: Self-pay | Admitting: Family Medicine

## 2013-10-10 ENCOUNTER — Other Ambulatory Visit: Payer: Self-pay | Admitting: Hematology & Oncology

## 2013-10-16 ENCOUNTER — Other Ambulatory Visit: Payer: Self-pay | Admitting: Family Medicine

## 2013-11-12 NOTE — Telephone Encounter (Signed)
rx question 

## 2013-11-14 ENCOUNTER — Encounter: Payer: Self-pay | Admitting: Hematology & Oncology

## 2013-11-14 ENCOUNTER — Other Ambulatory Visit (HOSPITAL_BASED_OUTPATIENT_CLINIC_OR_DEPARTMENT_OTHER): Payer: BC Managed Care – PPO | Admitting: Lab

## 2013-11-14 ENCOUNTER — Ambulatory Visit (HOSPITAL_BASED_OUTPATIENT_CLINIC_OR_DEPARTMENT_OTHER): Payer: BC Managed Care – PPO | Admitting: Hematology & Oncology

## 2013-11-14 VITALS — BP 124/64 | HR 70 | Temp 98.2°F | Resp 14 | Ht 63.0 in | Wt 114.0 lb

## 2013-11-14 DIAGNOSIS — D509 Iron deficiency anemia, unspecified: Secondary | ICD-10-CM

## 2013-11-14 DIAGNOSIS — D473 Essential (hemorrhagic) thrombocythemia: Secondary | ICD-10-CM

## 2013-11-14 DIAGNOSIS — R634 Abnormal weight loss: Secondary | ICD-10-CM

## 2013-11-14 LAB — CBC WITH DIFFERENTIAL (CANCER CENTER ONLY)
BASO#: 0 10*3/uL (ref 0.0–0.2)
BASO%: 1.1 % (ref 0.0–2.0)
EOS%: 4.5 % (ref 0.0–7.0)
Eosinophils Absolute: 0.2 10*3/uL (ref 0.0–0.5)
HCT: 37.5 % (ref 34.8–46.6)
HGB: 12.7 g/dL (ref 11.6–15.9)
LYMPH#: 1.3 10*3/uL (ref 0.9–3.3)
LYMPH%: 36.7 % (ref 14.0–48.0)
MCH: 39.1 pg — ABNORMAL HIGH (ref 26.0–34.0)
MCHC: 33.9 g/dL (ref 32.0–36.0)
MCV: 115 fL — ABNORMAL HIGH (ref 81–101)
MONO#: 0.3 10*3/uL (ref 0.1–0.9)
MONO%: 7.8 % (ref 0.0–13.0)
NEUT#: 1.8 10*3/uL (ref 1.5–6.5)
NEUT%: 49.9 % (ref 39.6–80.0)
Platelets: 568 10*3/uL — ABNORMAL HIGH (ref 145–400)
RBC: 3.25 10*6/uL — ABNORMAL LOW (ref 3.70–5.32)
RDW: 12.1 % (ref 11.1–15.7)
WBC: 3.6 10*3/uL — ABNORMAL LOW (ref 3.9–10.0)

## 2013-11-14 LAB — IRON AND TIBC CHCC
%SAT: 46 % (ref 21–57)
Iron: 103 ug/dL (ref 41–142)
TIBC: 226 ug/dL — ABNORMAL LOW (ref 236–444)
UIBC: 123 ug/dL (ref 120–384)

## 2013-11-14 LAB — FERRITIN CHCC: Ferritin: 293 ng/ml — ABNORMAL HIGH (ref 9–269)

## 2013-11-14 LAB — CHCC SATELLITE - SMEAR

## 2013-11-14 MED ORDER — ONDANSETRON HCL 8 MG PO TABS: 8.0000 mg | ORAL_TABLET | Freq: Three times a day (TID) | ORAL | Status: DC | PRN

## 2013-11-14 NOTE — Progress Notes (Signed)
Hematology and Oncology Follow Up Visit  Rachael Johnson [id number] June 11, 1951 63 y.o. 11/14/2013   Principle Diagnosis:  Essential thrombocythemia - JAK2 negative. 2. Intermittent iron-deficiency anemia.  Current Therapy:   Hydrea 500 mg p.o. daily. 2. IV iron as indicated.     Interim History:  [name] is in for followup. She continues to lose weight. She says that this typically happens this time of year. She still does have an echo a difficult time getting over her husbands death. He passed away I think about 3 years ago.  She says that she is eating okay. She's had a little nausea. She is on Compazine. She doesn't think is working all that well. I'll try some Zofran.  She's had no bleeding. There's been no change in bowel or bladder habits.  Her last I stated that we did back in January looked fine. Her ferritin was 3.5 and iron saturation of 56%. Medications: Current outpatient prescriptions:acetaminophen-codeine (TYLENOL #3) 300-30 MG per tablet, Take 1 tablet by mouth every 6 (six) hours as needed for moderate pain or severe pain., Disp: 90 tablet, Rfl: 3;  ALPRAZolam (XANAX) 1 MG tablet, Take 1 tablet (1 mg total) by mouth 3 (three) times daily as needed for sleep or anxiety., Disp: 90 tablet, Rfl: 3;  aspirin EC 81 MG tablet, Take 81 mg by mouth every morning., Disp: , Rfl:  citalopram (CELEXA) 40 MG tablet, Take 1 tablet (40 mg total) by mouth daily. In am, Disp: 90 tablet, Rfl: 3;  estradiol (ESTRACE) 0.5 MG tablet, TAKE ONE TABLET BY MOUTH TWICE DAILY, Disp: 180 tablet, Rfl: 0;  fluorometholone (FML) 0.1 % ophthalmic suspension, Place 1 drop into both eyes daily as needed (For dry eyes.). , Disp: , Rfl: ;  furosemide (LASIX) 20 MG tablet, Take 1 tablet (20 mg total) by mouth every morning., Disp: 90 tablet, Rfl: 2 hydroxyurea (HYDREA) 500 MG capsule, Take 1 capsule (500 mg total) by mouth daily after breakfast., Disp: 90 capsule, Rfl: 3;  levothyroxine (SYNTHROID, LEVOTHROID) 88  MCG tablet, TAKE 1 TABLET (88 MCG TOTAL)     BY MOUTH DAILY BEFORE BREAKFAST., Disp: 30 tablet, Rfl: 2;  loratadine (CLARITIN) 10 MG tablet, Take 1 tablet (10 mg total) by mouth daily as needed for allergies. Pruritus, allergies, Disp: 30 tablet, Rfl: 11 Multiple Vitamin (MULTIVITAMIN PO), Take 1 tablet by mouth every morning. , Disp: , Rfl: ;  naproxen (NAPROSYN) 500 MG tablet, TAKE ONE TABLET BY MOUTH TWICE A DAY WITH MEALS, Disp: 180 tablet, Rfl: 1;  NEOMYCIN-POLYMYXIN-HYDROCORTISONE (CORTISPORIN) 1 % SOLN otic solution, Place 3 drops into both ears at bedtime as needed. Wax, itching, Disp: 10 mL, Rfl: 0 omeprazole (PRILOSEC) 40 MG capsule, Take 1 capsule (40 mg total) by mouth 2 (two) times daily. Symptomatic on qd dosing. Already on max H2 blocker, Disp: 60 capsule, Rfl: 1;  ranitidine (ZANTAC) 300 MG tablet, TAKE 1 TABLET (300 MG TOTAL)   BY MOUTH AT BEDTIME AS NEEDEDFORHEARTBU RN, Disp: 30 tablet, Rfl: 2;  venlafaxine XR (EFFEXOR-XR) 150 MG 24 hr capsule, TAKE ONE CAPSULE BY MOUTH ONE TIME DAILY, Disp: 90 capsule, Rfl: 0 Vitamin D, Ergocalciferol, (DRISDOL) 50000 UNITS CAPS capsule, TAKE 1 CAPSULE BY MOUTH EVERY OTHER WEEK AS INSTRUCTED, Disp: 6 capsule, Rfl: 2;  ondansetron (ZOFRAN) 8 MG tablet, Take 1 tablet (8 mg total) by mouth every 8 (eight) hours as needed for nausea or vomiting., Disp: 20 tablet, Rfl: 2  Allergies: No Known Allergies  Past Medical History,  Surgical history, Social history, and Family History were reviewed and updated.  Review of Systems: As above  Physical Exam:  height is 5\' 3"  (1.6 m) and weight is 114 lb (51.71 kg). Her oral temperature is 98.2 F (36.8 C). Her blood pressure is 124/64 and her pulse is 70. Her respiration is 14.   Very thin white female. Lungs are clear. Cardiac exam regular in rhythm. Abdomen soft. Good bowel sounds. There is no fluid wave. There is no palpable liver or spleen tip. Oral exam shows dry oral mucosa. Extremities shows some muscle  atrophy in upper and lower from Rachael Johnson. Joints are somewhat osteoarthritic. Skin exam no rashes. Neurological exam shows no focal neurological deficits.  Lab Results  Component Value Date   WBC 3.2* 08/22/2013   HGB 12.0 08/22/2013   HCT 36.6 08/22/2013   MCV 118* 08/22/2013   PLT 497* 08/22/2013     Chemistry      Component Value Date/Time   NA 138 04/18/2013 0941   NA 139 08/26/2010 0859   K 4.1 04/18/2013 0941   K 3.6 08/26/2010 0859   CL 105 04/18/2013 0941   CL 104 08/26/2010 0859   CO2 31 04/18/2013 0941   CO2 27 08/26/2010 0859   BUN 16 04/18/2013 0941   BUN 16 08/26/2010 0859   CREATININE 0.64 04/18/2013 0941   CREATININE 0.66 09/19/2012 1045      Component Value Date/Time   CALCIUM 9.0 04/18/2013 0941   CALCIUM 8.5 08/26/2010 0859   ALKPHOS 51 02/21/2013 1041   ALKPHOS 50 08/26/2010 0859   AST 18 02/21/2013 1041   AST 22 08/26/2010 0859   ALT 10 02/21/2013 1041   ALT 13 08/26/2010 0859   BILITOT 0.3 02/21/2013 1041   BILITOT 0.70 08/26/2010 0859         Impression and Plan: Rachael Johnson is 63 year old white female. She has essential thrombocythemia. She is JAK2 negative. I am sending off a Calreticulin assay on her. I think this would be interesting to see if it is positive.  I will see what the Zofran as far.  We will continue following her every few months.  I just feel that she continues to lose weight, she will have more issues. I think this will put too much stress on her heart and she may not feel to recover.  I will pray hard for her. I no that she still suffers dearly from loss of her husband.   Rachael Napoleon, MD 4/17/20158:37 AM

## 2013-11-16 ENCOUNTER — Other Ambulatory Visit: Payer: Self-pay | Admitting: Family Medicine

## 2013-11-17 ENCOUNTER — Other Ambulatory Visit: Payer: Self-pay | Admitting: Family Medicine

## 2013-11-18 ENCOUNTER — Other Ambulatory Visit: Payer: Self-pay | Admitting: Nurse Practitioner

## 2013-11-18 DIAGNOSIS — D473 Essential (hemorrhagic) thrombocythemia: Secondary | ICD-10-CM

## 2013-11-18 LAB — COMPREHENSIVE METABOLIC PANEL
ALT: 13 U/L (ref 0–35)
AST: 23 U/L (ref 0–37)
Albumin: 4.1 g/dL (ref 3.5–5.2)
Alkaline Phosphatase: 67 U/L (ref 39–117)
BUN: 15 mg/dL (ref 6–23)
CO2: 28 mEq/L (ref 19–32)
Calcium: 9.2 mg/dL (ref 8.4–10.5)
Chloride: 101 mEq/L (ref 96–112)
Creatinine, Ser: 0.71 mg/dL (ref 0.50–1.10)
Glucose, Bld: 75 mg/dL (ref 70–99)
Potassium: 4.4 mEq/L (ref 3.5–5.3)
Sodium: 136 mEq/L (ref 135–145)
Total Bilirubin: 0.3 mg/dL (ref 0.2–1.2)
Total Protein: 6.2 g/dL (ref 6.0–8.3)

## 2013-11-18 LAB — CALRETICULIN (CALR) MUTATION ANALYSIS: CALR Mutation (exon 9): NOT DETECTED

## 2013-11-18 MED ORDER — HYDROXYUREA 500 MG PO CAPS: 500.0000 mg | ORAL_CAPSULE | Freq: Every day | ORAL | Status: DC

## 2013-11-19 ENCOUNTER — Telehealth: Payer: Self-pay | Admitting: Nurse Practitioner

## 2013-11-19 NOTE — Telephone Encounter (Addendum)
Message copied by Jimmy Footman on Wed Nov 19, 2013 10:52 AM ------      Message from: Burney Gauze R      Created: Mon Nov 17, 2013  6:54 PM       Please call let her know that the iron studies are normal. Thanks. Pete ------LVM on pt's personal machine. Informed her to do not hesitate to contact our office with any further questions or concerns.

## 2013-11-24 ENCOUNTER — Encounter: Payer: Self-pay | Admitting: Family Medicine

## 2013-11-24 ENCOUNTER — Ambulatory Visit (INDEPENDENT_AMBULATORY_CARE_PROVIDER_SITE_OTHER): Payer: BC Managed Care – PPO | Admitting: Family Medicine

## 2013-11-24 VITALS — BP 124/82 | HR 74 | Temp 97.2°F | Ht 63.0 in | Wt 114.1 lb

## 2013-11-24 DIAGNOSIS — F419 Anxiety disorder, unspecified: Secondary | ICD-10-CM

## 2013-11-24 DIAGNOSIS — F418 Other specified anxiety disorders: Secondary | ICD-10-CM

## 2013-11-24 DIAGNOSIS — R109 Unspecified abdominal pain: Secondary | ICD-10-CM

## 2013-11-24 DIAGNOSIS — E039 Hypothyroidism, unspecified: Secondary | ICD-10-CM

## 2013-11-24 DIAGNOSIS — F341 Dysthymic disorder: Secondary | ICD-10-CM

## 2013-11-24 DIAGNOSIS — D75839 Thrombocytosis, unspecified: Secondary | ICD-10-CM

## 2013-11-24 DIAGNOSIS — D473 Essential (hemorrhagic) thrombocythemia: Secondary | ICD-10-CM

## 2013-11-24 DIAGNOSIS — R634 Abnormal weight loss: Secondary | ICD-10-CM

## 2013-11-24 DIAGNOSIS — N390 Urinary tract infection, site not specified: Secondary | ICD-10-CM

## 2013-11-24 DIAGNOSIS — F329 Major depressive disorder, single episode, unspecified: Secondary | ICD-10-CM

## 2013-11-24 DIAGNOSIS — F32A Depression, unspecified: Secondary | ICD-10-CM

## 2013-11-24 DIAGNOSIS — K219 Gastro-esophageal reflux disease without esophagitis: Secondary | ICD-10-CM

## 2013-11-24 DIAGNOSIS — K5900 Constipation, unspecified: Secondary | ICD-10-CM | POA: Insufficient documentation

## 2013-11-24 LAB — RENAL FUNCTION PANEL
Albumin: 4.1 g/dL (ref 3.5–5.2)
BUN: 13 mg/dL (ref 6–23)
CO2: 29 mEq/L (ref 19–32)
Calcium: 9.3 mg/dL (ref 8.4–10.5)
Chloride: 104 mEq/L (ref 96–112)
Creat: 0.56 mg/dL (ref 0.50–1.10)
Glucose, Bld: 79 mg/dL (ref 70–99)
Phosphorus: 3.5 mg/dL (ref 2.3–4.6)
Potassium: 4.3 mEq/L (ref 3.5–5.3)
Sodium: 139 mEq/L (ref 135–145)

## 2013-11-24 LAB — HEPATIC FUNCTION PANEL
ALT: 10 U/L (ref 0–35)
AST: 17 U/L (ref 0–37)
Albumin: 4.1 g/dL (ref 3.5–5.2)
Alkaline Phosphatase: 74 U/L (ref 39–117)
Bilirubin, Direct: <0.1 mg/dL (ref 0.0–0.3)
Total Bilirubin: 0.2 mg/dL (ref 0.2–1.2)
Total Protein: 6.4 g/dL (ref 6.0–8.3)

## 2013-11-24 LAB — CBC
HCT: 37.3 % (ref 36.0–46.0)
Hemoglobin: 12.9 g/dL (ref 12.0–15.0)
MCH: 38.3 pg — ABNORMAL HIGH (ref 26.0–34.0)
MCHC: 34.6 g/dL (ref 30.0–36.0)
MCV: 110.7 fL — ABNORMAL HIGH (ref 78.0–100.0)
Platelets: 652 10*3/uL — ABNORMAL HIGH (ref 150–400)
RBC: 3.37 MIL/uL — ABNORMAL LOW (ref 3.87–5.11)
RDW: 13.6 % (ref 11.5–15.5)
WBC: 4.6 10*3/uL (ref 4.0–10.5)

## 2013-11-24 MED ORDER — ALPRAZOLAM 1 MG PO TABS: 1.0000 mg | ORAL_TABLET | Freq: Three times a day (TID) | ORAL | Status: DC | PRN

## 2013-11-24 NOTE — Patient Instructions (Addendum)
Switch probiotics Encouraged increased hydration and fiber in diet. Daily probiotics. If bowels not moving can use MOM 2 tbls po in 4 oz of warm prune juice by mouth every 2-3 days. If no results then repeat in 4 hours with  Dulcolax suppository pr, may repeat again in 4 more hours as needed. Seek care if symptoms worsen. Consider daily Miralax and/or Dulcolax if symptoms persist. Fiber like Benefiber twice a day   Urinary Tract Infection Urinary tract infections (UTIs) can develop anywhere along your urinary tract. Your urinary tract is your body's drainage system for removing wastes and extra water. Your urinary tract includes two kidneys, two ureters, a bladder, and a urethra. Your kidneys are a pair of bean-shaped organs. Each kidney is about the size of your fist. They are located below your ribs, one on each side of your spine. CAUSES Infections are caused by microbes, which are microscopic organisms, including fungi, viruses, and bacteria. These organisms are so small that they can only be seen through a microscope. Bacteria are the microbes that most commonly cause UTIs. SYMPTOMS  Symptoms of UTIs may vary by age and gender of the patient and by the location of the infection. Symptoms in young women typically include a frequent and intense urge to urinate and a painful, burning feeling in the bladder or urethra during urination. Older women and men are more likely to be tired, shaky, and weak and have muscle aches and abdominal pain. A fever may mean the infection is in your kidneys. Other symptoms of a kidney infection include pain in your back or sides below the ribs, nausea, and vomiting. DIAGNOSIS To diagnose a UTI, your caregiver will ask you about your symptoms. Your caregiver also will ask to provide a urine sample. The urine sample will be tested for bacteria and white blood cells. White blood cells are made by your body to help fight infection. TREATMENT  Typically, UTIs can be  treated with medication. Because most UTIs are caused by a bacterial infection, they usually can be treated with the use of antibiotics. The choice of antibiotic and length of treatment depend on your symptoms and the type of bacteria causing your infection. HOME CARE INSTRUCTIONS  If you were prescribed antibiotics, take them exactly as your caregiver instructs you. Finish the medication even if you feel better after you have only taken some of the medication.  Drink enough water and fluids to keep your urine clear or pale yellow.  Avoid caffeine, tea, and carbonated beverages. They tend to irritate your bladder.  Empty your bladder often. Avoid holding urine for long periods of time.  Empty your bladder before and after sexual intercourse.  After a bowel movement, women should cleanse from front to back. Use each tissue only once. SEEK MEDICAL CARE IF:   You have back pain.  You develop a fever.  Your symptoms do not begin to resolve within 3 days. SEEK IMMEDIATE MEDICAL CARE IF:   You have severe back pain or lower abdominal pain.  You develop chills.  You have nausea or vomiting.  You have continued burning or discomfort with urination. MAKE SURE YOU:   Understand these instructions.  Will watch your condition.  Will get help right away if you are not doing well or get worse. Document Released: 04/26/2005 Document Revised: 01/16/2012 Document Reviewed: 08/25/2011 [facility] Patient Information 2014 [place].       Constipation, Adult Constipation is when a person has fewer than 3 bowel movements a  week; has difficulty having a bowel movement; or has stools that are dry, hard, or larger than normal. As people grow older, constipation is more common. If you try to fix constipation with medicines that make you have a bowel movement (laxatives), the problem may get worse. Long-term laxative use may cause the muscles of the colon to become weak. A low-fiber diet, not  taking in enough fluids, and taking certain medicines may make constipation worse. CAUSES   Certain medicines, such as antidepressants, pain medicine, iron supplements, antacids, and water pills.   Certain diseases, such as diabetes, irritable bowel syndrome (IBS), thyroid disease, or depression.   Not drinking enough water.   Not eating enough fiber-rich foods.   Stress or travel.  Lack of physical activity or exercise.  Not going to the restroom when there is the urge to have a bowel movement.  Ignoring the urge to have a bowel movement.  Using laxatives too much. SYMPTOMS   Having fewer than 3 bowel movements a week.   Straining to have a bowel movement.   Having hard, dry, or larger than normal stools.   Feeling full or bloated.   Pain in the lower abdomen.  Not feeling relief after having a bowel movement. DIAGNOSIS  Your caregiver will take a medical history and perform a physical exam. Further testing may be done for severe constipation. Some tests may include:   A barium enema X-ray to examine your rectum, colon, and sometimes, your small intestine.  A sigmoidoscopy to examine your lower colon.  A colonoscopy to examine your entire colon. TREATMENT  Treatment will depend on the severity of your constipation and what is causing it. Some dietary treatments include drinking more fluids and eating more fiber-rich foods. Lifestyle treatments may include regular exercise. If these diet and lifestyle recommendations do not help, your caregiver may recommend taking over-the-counter laxative medicines to help you have bowel movements. Prescription medicines may be prescribed if over-the-counter medicines do not work.  HOME CARE INSTRUCTIONS   Increase dietary fiber in your diet, such as fruits, vegetables, whole grains, and beans. Limit high-fat and processed sugars in your diet, such as [place] fries, hamburgers, cookies, candies, and soda.   A fiber supplement  may be added to your diet if you cannot get enough fiber from foods.   Drink enough fluids to keep your urine clear or pale yellow.   Exercise regularly or as directed by your caregiver.   Go to the restroom when you have the urge to go. Do not hold it.  Only take medicines as directed by your caregiver. Do not take other medicines for constipation without talking to your caregiver first. Clinch IF:   You have bright red blood in your stool.   Your constipation lasts for more than 4 days or gets worse.   You have abdominal or rectal pain.   You have thin, pencil-like stools.  You have unexplained weight loss. MAKE SURE YOU:   Understand these instructions.  Will watch your condition.  Will get help right away if you are not doing well or get worse. Document Released: 04/14/2004 Document Revised: 10/09/2011 Document Reviewed: 04/28/2013 [facility] Patient Information 2014 [place], [place].

## 2013-11-24 NOTE — Assessment & Plan Note (Addendum)
Recently treated with antibiotics at work, better still symtomatic. Increase hydration. Started on Cefdinir

## 2013-11-24 NOTE — Assessment & Plan Note (Signed)
Tomorrow is her anniversary but she has been widowed she is very anxious. She is given a letter to be able to carry her therapy dog with her

## 2013-11-24 NOTE — Assessment & Plan Note (Signed)
On Levothyroxine, continue to monitor 

## 2013-11-24 NOTE — Progress Notes (Signed)
Pre visit review using our clinic review tool, if applicable. No additional management support is needed unless otherwise documented below in the visit note. 

## 2013-11-24 NOTE — Assessment & Plan Note (Signed)
Encouraged increased hydration and fiber in diet. Daily probiotics. If bowels not moving can use MOM 2 tbls po in 4 oz of warm prune juice by mouth every 2-3 days. If no results then repeat in 4 hours with  Dulcolax suppository pr, may repeat again in 4 more hours as needed. Seek care if symptoms worsen. Consider daily Miralax and/or Dulcolax if symptoms persist. Increase hydration to 64 oz of clear fluids

## 2013-11-24 NOTE — Progress Notes (Signed)
Patient ID: Rachael Johnson, female   DOB: 02-Sep-1950, 63 y.o.   MRN: [id number] Rachael Johnson [id number] 23-Apr-1951 11/24/2013      Progress Note-Follow Up  Subjective  Chief Complaint  Chief Complaint  Patient presents with  . Follow-up    2 month    HPI  Patient is a 63 year old female in today for routine medical care. Is in today for followup. She's feeling somewhat better. She notes the majority of her urinary symptoms improved with treatment but have been increasing again. Urinary frequency is noted. No fevers or chills but there is some suprapubic pressure. No dysuria. Her edema has improved. Denies CP/palp/SOB/HA/congestion/fevers. Taking meds as prescribed. Is struggling with some constipation no bloody or tarry stool  Past Medical History  Diagnosis Date  . Arthritis   . Allergy   . Thyroid disease   . Diverticulitis   . Frequent episodic tension-type headache   . Hypertension   . Seizure     childhood  . Anemia, iron deficiency 07/08/2012  . Depression with anxiety 03/24/2011  . Depression   . GERD (gastroesophageal reflux disease)   . Essential thrombocythemia 01/24/2011  . H. pylori infection 12/19/2012  . Other and unspecified hyperlipidemia 02/25/2013  . Preventative health care 04/20/2013  . Cerumen impaction 04/28/2013  . 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 Esophageal reflux 08/17/2013    Past Surgical History  Procedure Laterality Date  . Breast surgery  1992    biopsy, benign. Fibrocystic  . Abdominal hysterectomy      1992  . Umbilical hernia repair N/A 09/25/2012    Procedure: HERNIA REPAIR UMBILICAL ADULT;  Surgeon: Harl Bowie, MD;  Location: [facility];  Service: General;  Laterality: N/A;    Family History  Problem Relation Age of Onset  . Arthritis Mother   . Cancer Mother     ovarian  . Hypertension Mother   . Heart disease Mother     pacer  . Heart failure Mother   . Arthritis Father   . Cancer Father     lung  . Hypertension Father   .  Cancer Sister     lung  . Cancer Brother     prostate  . Cancer Brother     lung  . Hypertension Son   . COPD Brother   . Heart disease Brother   . Hypertension Brother   . Alcohol abuse Brother   . Cirrhosis Brother   . Seizures Sister     History   Social History  . Marital Status: Widowed    Spouse Name: N/A    Number of Children: 2  . Years of Education: N/A   Occupational History  . APL operator     [employer]   Social History Main Topics  . Smoking status: Former Smoker -- 3.50 packs/day for 20 years    Types: Cigarettes    Start date: 11/15/1970    Quit date: 07/31/1990  . Smokeless tobacco: Never Used     Comment: quit 23 years ago  . Alcohol Use: No  . Drug Use: No  . Sexual Activity: No   Other Topics Concern  . Not on file   Social History Narrative   Regular exercise: no   Caffeine Use: 2-3 weekly    Current Outpatient Prescriptions on File Prior to Visit  Medication Sig Dispense Refill  . acetaminophen-codeine (TYLENOL #3) 300-30 MG per tablet Take 1 tablet by mouth every 6 (six) hours as needed  for moderate pain or severe pain.  90 tablet  3  . ALPRAZolam (XANAX) 1 MG tablet Take 1 tablet (1 mg total) by mouth 3 (three) times daily as needed for sleep or anxiety.  90 tablet  3  . aspirin EC 81 MG tablet Take 81 mg by mouth every morning.      . citalopram (CELEXA) 40 MG tablet Take 1 tablet (40 mg total) by mouth daily. In am  90 tablet  3  . estradiol (ESTRACE) 0.5 MG tablet TAKE ONE TABLET BY MOUTH TWICE DAILY  180 tablet  0  . fluorometholone (FML) 0.1 % ophthalmic suspension Place 1 drop into both eyes daily as needed (For dry eyes.).       Marland Kitchen furosemide (LASIX) 20 MG tablet Take 1 tablet (20 mg total) by mouth every morning.  90 tablet  2  . hydroxyurea (HYDREA) 500 MG capsule Take 1 capsule (500 mg total) by mouth daily after breakfast.  90 capsule  4  . levothyroxine (SYNTHROID, LEVOTHROID) 88 MCG tablet TAKE 1 TABLET (88 MCG TOTAL)     BY  MOUTH DAILY BEFORE BREAKFAST.  30 tablet  2  . loratadine (CLARITIN) 10 MG tablet Take 1 tablet (10 mg total) by mouth daily as needed for allergies. Pruritus, allergies  30 tablet  11  . Multiple Vitamin (MULTIVITAMIN PO) Take 1 tablet by mouth every morning.       . naproxen (NAPROSYN) 500 MG tablet TAKE ONE TABLET BY MOUTH TWICE A DAY WITH MEALS  180 tablet  1  . NEOMYCIN-POLYMYXIN-HYDROCORTISONE (CORTISPORIN) 1 % SOLN otic solution Place 3 drops into both ears at bedtime as needed. Wax, itching  10 mL  0  . omeprazole (PRILOSEC) 40 MG capsule TAKE ONE CAPSULE BY MOUTH TWICE DAILY  60 capsule  0  . ondansetron (ZOFRAN) 8 MG tablet Take 1 tablet (8 mg total) by mouth every 8 (eight) hours as needed for nausea or vomiting.  20 tablet  2  . ranitidine (ZANTAC) 300 MG tablet TAKE 1 TABLET (300 MG TOTAL)   BY MOUTH AT BEDTIME AS NEEDEDFORHEARTBU RN  30 tablet  2  . venlafaxine XR (EFFEXOR-XR) 150 MG 24 hr capsule TAKE ONE CAPSULE BY MOUTH ONE TIME DAILY  90 capsule  1  . Vitamin D, Ergocalciferol, (DRISDOL) 50000 UNITS CAPS capsule TAKE 1 CAPSULE BY MOUTH EVERY OTHER WEEK AS INSTRUCTED  6 capsule  2   No current facility-administered medications on file prior to visit.    No Known Allergies  Review of Systems  Review of Systems  Constitutional: Negative for fever and malaise/fatigue.  HENT: Negative for congestion.   Eyes: Negative for discharge.  Respiratory: Negative for shortness of breath.   Cardiovascular: Negative for chest pain, palpitations and leg swelling.  Gastrointestinal: Positive for abdominal pain and constipation. Negative for nausea, diarrhea, blood in stool and melena.  Genitourinary: Negative for dysuria.  Musculoskeletal: Negative for falls.  Skin: Negative for rash.  Neurological: Negative for loss of consciousness and headaches.  Endo/Heme/Allergies: Negative for polydipsia.  Psychiatric/Behavioral: Negative for depression and suicidal ideas. The patient is  nervous/anxious. The patient does not have insomnia.     Objective  BP 124/82  Pulse 74  Temp(Src) 97.2 F (36.2 C) (Oral)  Ht 5\' 3"  (1.6 m)  Wt 114 lb 1.3 oz (51.746 kg)  BMI 20.21 kg/m2  SpO2 97%  LMP 07/31/1990  Physical Exam  Physical Exam  Constitutional: She is oriented to person, place, and  time and well-developed, well-nourished, and in no distress. No distress.  HENT:  Head: Normocephalic and atraumatic.  Eyes: Conjunctivae are normal.  Neck: Neck supple. No thyromegaly present.  Cardiovascular: Normal rate, regular rhythm and normal heart sounds.   No murmur heard. Pulmonary/Chest: Effort normal and breath sounds normal. She has no wheezes.  Abdominal: She exhibits no distension and no mass.  Musculoskeletal: She exhibits no edema.  Lymphadenopathy:    She has no cervical adenopathy.  Neurological: She is alert and oriented to person, place, and time.  Skin: Skin is warm and dry. No rash noted. She is not diaphoretic.  Psychiatric: Memory, affect and judgment normal.    Lab Results  Component Value Date   TSH 2.588 04/18/2013   Lab Results  Component Value Date   WBC 3.6* 11/14/2013   HGB 12.7 11/14/2013   HCT 37.5 11/14/2013   MCV 115* 11/14/2013   PLT 568* 11/14/2013   Lab Results  Component Value Date   CREATININE 0.71 11/14/2013   BUN 15 11/14/2013   NA 136 11/14/2013   K 4.4 11/14/2013   CL 101 11/14/2013   CO2 28 11/14/2013   Lab Results  Component Value Date   ALT 13 11/14/2013   AST 23 11/14/2013   ALKPHOS 67 11/14/2013   BILITOT 0.3 11/14/2013   Lab Results  Component Value Date   CHOL 169 04/18/2013   Lab Results  Component Value Date   HDL 53 04/18/2013   Lab Results  Component Value Date   LDLCALC 98 04/18/2013   Lab Results  Component Value Date   TRIG 92 04/18/2013   Lab Results  Component Value Date   CHOLHDL 3.2 04/18/2013     Assessment & Plan   Unspecified constipation Encouraged increased hydration and fiber in diet.  Daily probiotics. If bowels not moving can use MOM 2 tbls po in 4 oz of warm prune juice by mouth every 2-3 days. If no results then repeat in 4 hours with  Dulcolax suppository pr, may repeat again in 4 more hours as needed. Seek care if symptoms worsen. Consider daily Miralax and/or Dulcolax if symptoms persist. Increase hydration to 64 oz of clear fluids  UTI (urinary tract infection) Recently treated with antibiotics at work, better still symtomatic. Increase hydration. Started on Cefdinir  Hypothyroid On Levothyroxine, continue to monitor  Depression with anxiety Tomorrow is her anniversary but she has been widowed she is very anxious. She is given a letter to be able to carry her therapy dog with her  Essential thrombocythemia Continues to increase patient will follow up with hematology.  Weight loss Weight is stable. Encouraged increased protein  Esophageal reflux Avoid offending foods, start probiotics. Do not eat large meals in late evening and consider raising head of bed.

## 2013-11-25 ENCOUNTER — Telehealth: Payer: Self-pay | Admitting: *Deleted

## 2013-11-25 LAB — URINALYSIS
Bilirubin Urine: NEGATIVE
Glucose, UA: NEGATIVE mg/dL
Hgb urine dipstick: NEGATIVE
Ketones, ur: NEGATIVE mg/dL
Nitrite: POSITIVE — AB
Protein, ur: NEGATIVE mg/dL
Specific Gravity, Urine: 1.013 (ref 1.005–1.030)
Urobilinogen, UA: 0.2 mg/dL (ref 0.0–1.0)
pH: 5 (ref 5.0–8.0)

## 2013-11-25 NOTE — Telephone Encounter (Signed)
Informed pt that Dr Marin Olp suggests taking 2 Hydrea pills instead of 1 to help with high platelet count

## 2013-11-26 ENCOUNTER — Other Ambulatory Visit: Payer: Self-pay | Admitting: Family Medicine

## 2013-11-26 ENCOUNTER — Encounter: Payer: Self-pay | Admitting: Nurse Practitioner

## 2013-11-26 LAB — URINE CULTURE: Colony Count: >=100000

## 2013-11-26 NOTE — Progress Notes (Signed)
LVM on pt's personal machine and informed her that per Dr. Marin Olp she should continue to take her abx for UTI as prescribed. During her abx therapy she is to take her normal dose of Hydrea. Once she completes her abx she will begin taking 2 tabs po instead of the 1 and will come in for repeat labwork within a week of the dose change.

## 2013-11-27 NOTE — Telephone Encounter (Signed)
Pt requesting refill of naproxen.   Last Rx was 06/01/13 # 180 x 1 rf. Last OV was 11/24/13 Next OV 01/14/14 Please advise refill.

## 2013-11-28 ENCOUNTER — Telehealth: Payer: Self-pay | Admitting: Hematology & Oncology

## 2013-11-28 MED ORDER — CEFDINIR 300 MG PO CAPS: 300.0000 mg | ORAL_CAPSULE | Freq: Two times a day (BID) | ORAL | Status: DC

## 2013-11-28 NOTE — Telephone Encounter (Signed)
Pt aware of 5-11 lab

## 2013-11-30 NOTE — Assessment & Plan Note (Signed)
Weight is stable. Encouraged increased protein

## 2013-11-30 NOTE — Assessment & Plan Note (Signed)
Continues to increase patient will follow up with hematology.

## 2013-11-30 NOTE — Assessment & Plan Note (Signed)
Avoid offending foods, start probiotics. Do not eat large meals in late evening and consider raising head of bed.  

## 2013-12-05 ENCOUNTER — Telehealth: Payer: Self-pay

## 2013-12-05 MED ORDER — CIPROFLOXACIN HCL 250 MG PO TABS: 250.0000 mg | ORAL_TABLET | Freq: Two times a day (BID) | ORAL | Status: DC

## 2013-12-05 NOTE — Telephone Encounter (Signed)
Patient called in stating that she is at work and can't bring a urine sample but she is having burning with urination now (didn't before)? Pt states she is done with the Cefdinir and feels like she is worse now?  Pt would like an antibiotic sent to the pharmacy  Pt will call back around 4 to see what md says  Please advise?

## 2013-12-05 NOTE — Telephone Encounter (Signed)
Ciprofloxacin 250 mg 1 tab po bid x 5 days and let us get a sample next week if no improvement. Needs probiotics

## 2013-12-05 NOTE — Telephone Encounter (Signed)
Patient informed and rx sent to pharmacy. 

## 2013-12-08 ENCOUNTER — Ambulatory Visit (HOSPITAL_BASED_OUTPATIENT_CLINIC_OR_DEPARTMENT_OTHER): Payer: BC Managed Care – PPO | Admitting: Lab

## 2013-12-08 DIAGNOSIS — D473 Essential (hemorrhagic) thrombocythemia: Secondary | ICD-10-CM

## 2013-12-08 DIAGNOSIS — D509 Iron deficiency anemia, unspecified: Secondary | ICD-10-CM

## 2013-12-08 LAB — CMP (CANCER CENTER ONLY)
ALT(SGPT): 14 U/L (ref 10–47)
AST: 23 U/L (ref 11–38)
Albumin: 3.6 g/dL (ref 3.3–5.5)
Alkaline Phosphatase: 63 U/L (ref 26–84)
BUN, Bld: 19 mg/dL (ref 7–22)
CO2: 30 mEq/L (ref 18–33)
Calcium: 8.6 mg/dL (ref 8.0–10.3)
Chloride: 101 mEq/L (ref 98–108)
Creat: 0.7 mg/dl (ref 0.6–1.2)
Glucose, Bld: 76 mg/dL (ref 73–118)
Potassium: 3.3 mEq/L (ref 3.3–4.7)
Sodium: 136 mEq/L (ref 128–145)
Total Bilirubin: 0.4 mg/dl (ref 0.20–1.60)
Total Protein: 6.6 g/dL (ref 6.4–8.1)

## 2013-12-08 LAB — CBC WITH DIFFERENTIAL (CANCER CENTER ONLY)
BASO#: 0 10*3/uL (ref 0.0–0.2)
BASO%: 1 % (ref 0.0–2.0)
EOS%: 3.5 % (ref 0.0–7.0)
Eosinophils Absolute: 0.1 10*3/uL (ref 0.0–0.5)
HCT: 34.7 % — ABNORMAL LOW (ref 34.8–46.6)
HGB: 11.6 g/dL (ref 11.6–15.9)
LYMPH#: 1.1 10*3/uL (ref 0.9–3.3)
LYMPH%: 27 % (ref 14.0–48.0)
MCH: 39.7 pg — ABNORMAL HIGH (ref 26.0–34.0)
MCHC: 33.4 g/dL (ref 32.0–36.0)
MCV: 119 fL — ABNORMAL HIGH (ref 81–101)
MONO#: 0.3 10*3/uL (ref 0.1–0.9)
MONO%: 6.8 % (ref 0.0–13.0)
NEUT#: 2.5 10*3/uL (ref 1.5–6.5)
NEUT%: 61.7 % (ref 39.6–80.0)
Platelets: 454 10*3/uL — ABNORMAL HIGH (ref 145–400)
RBC: 2.92 10*6/uL — ABNORMAL LOW (ref 3.70–5.32)
RDW: 12.2 % (ref 11.1–15.7)
WBC: 4 10*3/uL (ref 3.9–10.0)

## 2013-12-08 LAB — IRON AND TIBC CHCC
%SAT: 44 % (ref 21–57)
Iron: 93 ug/dL (ref 41–142)
TIBC: 210 ug/dL — ABNORMAL LOW (ref 236–444)
UIBC: 117 ug/dL — ABNORMAL LOW (ref 120–384)

## 2013-12-08 LAB — FERRITIN CHCC: Ferritin: 265 ng/ml (ref 9–269)

## 2013-12-08 LAB — LACTATE DEHYDROGENASE: LDH: 143 U/L (ref 94–250)

## 2013-12-08 LAB — PREALBUMIN: Prealbumin: 24.1 mg/dL (ref 17.0–34.0)

## 2013-12-15 ENCOUNTER — Other Ambulatory Visit: Payer: Self-pay | Admitting: Family Medicine

## 2013-12-15 MED ORDER — OMEPRAZOLE 40 MG PO CPDR: 40.0000 mg | DELAYED_RELEASE_CAPSULE | Freq: Two times a day (BID) | ORAL | Status: DC

## 2013-12-15 NOTE — Addendum Note (Signed)
Addended by: Varney Daily on: 12/15/2013 02:04 PM   Modules accepted: Orders

## 2013-12-26 ENCOUNTER — Other Ambulatory Visit: Payer: Self-pay | Admitting: Family Medicine

## 2014-01-01 ENCOUNTER — Other Ambulatory Visit: Payer: Self-pay | Admitting: Family Medicine

## 2014-01-01 NOTE — Telephone Encounter (Signed)
Rx request to pharmacy/[facility]  

## 2014-01-08 ENCOUNTER — Other Ambulatory Visit: Payer: Self-pay | Admitting: *Deleted

## 2014-01-08 DIAGNOSIS — D473 Essential (hemorrhagic) thrombocythemia: Secondary | ICD-10-CM

## 2014-01-09 ENCOUNTER — Encounter: Payer: Self-pay | Admitting: Hematology & Oncology

## 2014-01-09 ENCOUNTER — Ambulatory Visit (HOSPITAL_BASED_OUTPATIENT_CLINIC_OR_DEPARTMENT_OTHER): Payer: BC Managed Care – PPO | Admitting: Hematology & Oncology

## 2014-01-09 ENCOUNTER — Other Ambulatory Visit (HOSPITAL_BASED_OUTPATIENT_CLINIC_OR_DEPARTMENT_OTHER): Payer: BC Managed Care – PPO | Admitting: Lab

## 2014-01-09 VITALS — BP 121/65 | HR 72 | Temp 97.2°F | Resp 14 | Ht 63.0 in | Wt 119.0 lb

## 2014-01-09 DIAGNOSIS — D509 Iron deficiency anemia, unspecified: Secondary | ICD-10-CM

## 2014-01-09 DIAGNOSIS — D473 Essential (hemorrhagic) thrombocythemia: Secondary | ICD-10-CM

## 2014-01-09 LAB — IRON AND TIBC CHCC
%SAT: 56 % (ref 21–57)
Iron: 117 ug/dL (ref 41–142)
TIBC: 208 ug/dL — ABNORMAL LOW (ref 236–444)
UIBC: 91 ug/dL — ABNORMAL LOW (ref 120–384)

## 2014-01-09 LAB — PREALBUMIN: Prealbumin: 25 mg/dL (ref 17.0–34.0)

## 2014-01-09 LAB — LACTATE DEHYDROGENASE: LDH: 139 U/L (ref 94–250)

## 2014-01-09 LAB — CBC WITH DIFFERENTIAL (CANCER CENTER ONLY)
BASO#: 0 10*3/uL (ref 0.0–0.2)
BASO%: 0.9 % (ref 0.0–2.0)
EOS%: 8.2 % — ABNORMAL HIGH (ref 0.0–7.0)
Eosinophils Absolute: 0.2 10*3/uL (ref 0.0–0.5)
HCT: 31.8 % — ABNORMAL LOW (ref 34.8–46.6)
HGB: 10.7 g/dL — ABNORMAL LOW (ref 11.6–15.9)
LYMPH#: 0.7 10*3/uL — ABNORMAL LOW (ref 0.9–3.3)
LYMPH%: 30.5 % (ref 14.0–48.0)
MCH: 40.8 pg — ABNORMAL HIGH (ref 26.0–34.0)
MCHC: 33.6 g/dL (ref 32.0–36.0)
MCV: 121 fL — ABNORMAL HIGH (ref 81–101)
MONO#: 0.1 10*3/uL (ref 0.1–0.9)
MONO%: 6 % (ref 0.0–13.0)
NEUT#: 1.3 10*3/uL — ABNORMAL LOW (ref 1.5–6.5)
NEUT%: 54.4 % (ref 39.6–80.0)
Platelets: 257 10*3/uL (ref 145–400)
RBC: 2.62 10*6/uL — ABNORMAL LOW (ref 3.70–5.32)
RDW: 14.7 % (ref 11.1–15.7)
WBC: 2.3 10*3/uL — ABNORMAL LOW (ref 3.9–10.0)

## 2014-01-09 LAB — FERRITIN CHCC: Ferritin: 236 ng/ml (ref 9–269)

## 2014-01-09 NOTE — Progress Notes (Signed)
Hematology and Oncology Follow Up Visit  Rachael Johnson [id number] Feb 29, 1951 63 y.o. 01/09/2014   Principle Diagnosis:  Essential thrombocythemia - JAK2 negative. 2. Intermittent iron-deficiency anemia.  Current Therapy:   Hydrea 500 mg po bid.  to decrease dose down to 500 mg a day       Interim History:  [name] is back for followup. She actually looks much better. She feels a little better. Last Wednesday he was 3 years since her husband died in a car accident. This still has followed her quite a bit.  She had a heart, the Hydrea dose at twice a day. He, I think we can probably decrease the dose to once a day.  She's thinking about retiring. I certainly would not labor. She's been working for a long time. She really needs to take some time off. I think this would be helpful for her. She's had no bleeding. She's had no cough. She's had no rashes. She's had some chronic mild leg edema.  Her last iron studies back in May showed a ferritin of 265 with an iron saturation 44%.  Medications: Current outpatient prescriptions:acetaminophen-codeine (TYLENOL #3) 300-30 MG per tablet, Take 1 tablet by mouth every 6 (six) hours as needed for moderate pain or severe pain., Disp: 90 tablet, Rfl: 3;  ALPRAZolam (XANAX) 1 MG tablet, Take 1 tablet (1 mg total) by mouth 3 (three) times daily as needed for sleep or anxiety., Disp: 90 tablet, Rfl: 3;  aspirin EC 81 MG tablet, Take 81 mg by mouth every morning., Disp: , Rfl:  citalopram (CELEXA) 40 MG tablet, Take 1 tablet (40 mg total) by mouth daily. In am, Disp: 90 tablet, Rfl: 3;  estradiol (ESTRACE) 0.5 MG tablet, TAKE ONE TABLET BY MOUTH TWICE DAILY, Disp: 180 tablet, Rfl: 0;  fluorometholone (FML) 0.1 % ophthalmic suspension, Place 1 drop into both eyes daily as needed (For dry eyes.). , Disp: , Rfl: ;  furosemide (LASIX) 20 MG tablet, Take 1 tablet (20 mg total) by mouth every morning., Disp: 90 tablet, Rfl: 2 hydroxyurea (HYDREA) 500 MG capsule,  Take 1 capsule (500 mg total) by mouth daily after breakfast., Disp: 90 capsule, Rfl: 4;  levothyroxine (SYNTHROID, LEVOTHROID) 88 MCG tablet, TAKE 1 TABLET (88 MCG TOTAL)      BY MOUTH DAILY BEFORE BREAKFAST., Disp: 30 tablet, Rfl: 1;  loratadine (CLARITIN) 10 MG tablet, Take 1 tablet (10 mg total) by mouth daily as needed for allergies. Pruritus, allergies, Disp: 30 tablet, Rfl: 11 Multiple Vitamin (MULTIVITAMIN PO), Take 1 tablet by mouth every morning. , Disp: , Rfl: ;  naproxen (NAPROSYN) 500 MG tablet, TAKE ONE TABLET BY MOUTH TWICE A DAY WITH MEALS, Disp: 180 tablet, Rfl: 0;  NEOMYCIN-POLYMYXIN-HYDROCORTISONE (CORTISPORIN) 1 % SOLN otic solution, Place 3 drops into both ears at bedtime as needed. Wax, itching, Disp: 10 mL, Rfl: 0 Omega-3 Fatty Acids (FISH OIL) 1000 MG CAPS, Take by mouth 2 (two) times daily., Disp: , Rfl: ;  omeprazole (PRILOSEC) 40 MG capsule, Take 1 capsule (40 mg total) by mouth 2 (two) times daily., Disp: 60 capsule, Rfl: 3;  ondansetron (ZOFRAN) 8 MG tablet, Take 1 tablet (8 mg total) by mouth every 8 (eight) hours as needed for nausea or vomiting., Disp: 20 tablet, Rfl: 2 Probiotic Product (PROBIOTIC DAILY PO), Take by mouth 2 (two) times daily., Disp: , Rfl: ;  ranitidine (ZANTAC) 300 MG tablet, TAKE 1 TABLET (300 MG TOTAL)    BY MOUTH AT BEDTIME AS  NEEDEDFORHEARTBU  RN, Disp: 30 tablet, Rfl: 1;  venlafaxine XR (EFFEXOR-XR) 150 MG 24 hr capsule, TAKE ONE CAPSULE BY MOUTH ONE TIME DAILY, Disp: 90 capsule, Rfl: 1 Vitamin D, Ergocalciferol, (DRISDOL) 50000 UNITS CAPS capsule, TAKE 1 CAPSULE BY MOUTH EVERY OTHER WEEK AS INSTRUCTED, Disp: 6 capsule, Rfl: 2  Allergies: No Known Allergies  Past Medical History, Surgical history, Social history, and Family History were reviewed and updated.  Review of Systems: As above  Physical Exam:  height is 5\' 3"  (1.6 m) and weight is 119 lb (53.978 kg). Her oral temperature is 97.2 F (36.2 C). Her blood pressure is 121/65 and her pulse is  72. Her respiration is 14.   Thin white female. Lungs are clear. Cardiac exam regular rate and rhythm. No murmurs. Abdomen is soft. She has good bowel sounds. There is no fluid wave. There is no palpable liver or spleen. Back exam no tenderness over the spine. Extremities shows mild chronic nonpitting edema . Skin exam no rashes. Neurological exam no focal deficits.  Lab Results  Component Value Date   WBC 2.3* 01/09/2014   HGB 10.7* 01/09/2014   HCT 31.8* 01/09/2014   MCV 121* 01/09/2014   PLT 257 01/09/2014     Chemistry      Component Value Date/Time   NA 136 12/08/2013 0927   NA 139 11/24/2013 0912   K 3.3 12/08/2013 0927   K 4.3 11/24/2013 0912   CL 101 12/08/2013 0927   CL 104 11/24/2013 0912   CO2 30 12/08/2013 0927   CO2 29 11/24/2013 0912   BUN 19 12/08/2013 0927   BUN 13 11/24/2013 0912   CREATININE 0.7 12/08/2013 0927   CREATININE 0.71 11/14/2013 0815      Component Value Date/Time   CALCIUM 8.6 12/08/2013 0927   CALCIUM 9.3 11/24/2013 0912   ALKPHOS 63 12/08/2013 0927   ALKPHOS 74 11/24/2013 0912   AST 23 12/08/2013 0927   AST 17 11/24/2013 0912   ALT 14 12/08/2013 0927   ALT 10 11/24/2013 0912   BILITOT 0.40 12/08/2013 0927   BILITOT 0.2 11/24/2013 0912         Impression and Plan: [name] is 62 year old white female with essential thrombocythemia. She is doing okay. She is is a little anemic and leukopenic. This is from the [facility]. We will go ahead and cut her Hydrea dose back to 500 mg a day.  I will probably will get her back to see [place] in another 2 months.  We are checking her iron studies.  I am glad that her weight is up a little bit.  She had a new grandson born. This is incredibly important for her. She is very happy for her son.   [name], MD 6/12/20159:51 AM

## 2014-01-12 ENCOUNTER — Telehealth: Payer: Self-pay | Admitting: Nurse Practitioner

## 2014-01-12 NOTE — Telephone Encounter (Addendum)
Message copied by Jimmy Footman on Mon Jan 12, 2014  1:04 PM ------      Message from: Burney Gauze R      Created: Mon Jan 12, 2014  7:33 AM       Call - iron is ok!!  pete ------LVM on pt's personal machine and informed her to contact our office with any further questions or concerns.

## 2014-01-14 ENCOUNTER — Encounter: Payer: Self-pay | Admitting: Family Medicine

## 2014-01-14 ENCOUNTER — Ambulatory Visit (INDEPENDENT_AMBULATORY_CARE_PROVIDER_SITE_OTHER): Payer: BC Managed Care – PPO | Admitting: Family Medicine

## 2014-01-14 VITALS — BP 112/64 | HR 82 | Temp 98.1°F | Ht 63.0 in | Wt 116.1 lb

## 2014-01-14 DIAGNOSIS — K219 Gastro-esophageal reflux disease without esophagitis: Secondary | ICD-10-CM

## 2014-01-14 DIAGNOSIS — K5900 Constipation, unspecified: Secondary | ICD-10-CM

## 2014-01-14 DIAGNOSIS — D539 Nutritional anemia, unspecified: Secondary | ICD-10-CM

## 2014-01-14 DIAGNOSIS — E785 Hyperlipidemia, unspecified: Secondary | ICD-10-CM

## 2014-01-14 DIAGNOSIS — N390 Urinary tract infection, site not specified: Secondary | ICD-10-CM

## 2014-01-14 DIAGNOSIS — E039 Hypothyroidism, unspecified: Secondary | ICD-10-CM

## 2014-01-14 NOTE — Progress Notes (Signed)
Patient ID: BAKER KOGLER, female   DOB: 1951/07/19, 63 y.o.   MRN: [id number] CECILIA VANCLEVE [id number] [birth year] 01/14/2014      Progress Note-Follow Up  Subjective  Chief Complaint  Chief Complaint  Patient presents with  . Follow-up    2 month    HPI  Patient is a 63 year old female in today for routine medical care. Patient is doing well. She continues to eat but appetite is not robust. No recent illness but does feel she passed a kidney stone recently. Had some nausea but that resolved. Has noted some suprapubic pressure and chills which are now resolving. Denies CP/palp/SOB/HA/congestion/fever or GU c/o. Taking meds as prescribed  Past Medical History  Diagnosis Date  . Arthritis   . Allergy   . Thyroid disease   . Diverticulitis   . Frequent episodic tension-type headache   . Hypertension   . Seizure     childhood  . Anemia, iron deficiency 07/08/2012  . Depression with anxiety 03/24/2011  . Depression   . GERD (gastroesophageal reflux disease)   . Essential thrombocythemia 01/24/2011  . H. pylori infection 12/19/2012  . Other and unspecified hyperlipidemia 02/25/2013  . Preventative health care 04/20/2013  . Cerumen impaction 04/28/2013  . 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 Esophageal reflux 08/17/2013    Past Surgical History  Procedure Laterality Date  . Breast surgery  1992    biopsy, benign. Fibrocystic  . Abdominal hysterectomy      1992  . Umbilical hernia repair N/A 09/25/2012    Procedure: HERNIA REPAIR UMBILICAL ADULT;  Surgeon: Harl Bowie, MD;  Location: [facility];  Service: General;  Laterality: N/A;    Family History  Problem Relation Age of Onset  . Arthritis Mother   . Cancer Mother     ovarian  . Hypertension Mother   . Heart disease Mother     pacer  . Heart failure Mother   . Arthritis Father   . Cancer Father     lung  . Hypertension Father   . Cancer Sister     lung  . Cancer Brother     prostate  . Cancer Brother     lung  .  Hypertension Son   . COPD Brother   . Heart disease Brother   . Hypertension Brother   . Alcohol abuse Brother   . Cirrhosis Brother   . Seizures Sister     History   Social History  . Marital Status: Widowed    Spouse Name: N/A    Number of Children: 2  . Years of Education: N/A   Occupational History  . APL operator     [employer]   Social History Main Topics  . Smoking status: Former Smoker -- 3.50 packs/day for 20 years    Types: Cigarettes    Start date: 11/15/1970    Quit date: 07/31/1990  . Smokeless tobacco: Never Used     Comment: quit 23 years ago  . Alcohol Use: No  . Drug Use: No  . Sexual Activity: No   Other Topics Concern  . Not on file   Social History Narrative   Regular exercise: no   Caffeine Use: 2-3 weekly    Current Outpatient Prescriptions on File Prior to Visit  Medication Sig Dispense Refill  . acetaminophen-codeine (TYLENOL #3) 300-30 MG per tablet Take 1 tablet by mouth every 6 (six) hours as needed for moderate pain or severe pain.  90 tablet  3  . ALPRAZolam (XANAX) 1 MG tablet Take 1 tablet (1 mg total) by mouth 3 (three) times daily as needed for sleep or anxiety.  90 tablet  3  . aspirin EC 81 MG tablet Take 81 mg by mouth every morning.      . citalopram (CELEXA) 40 MG tablet Take 1 tablet (40 mg total) by mouth daily. In am  90 tablet  3  . estradiol (ESTRACE) 0.5 MG tablet TAKE ONE TABLET BY MOUTH TWICE DAILY  180 tablet  0  . fluorometholone (FML) 0.1 % ophthalmic suspension Place 1 drop into both eyes daily as needed (For dry eyes.).       Marland Kitchen furosemide (LASIX) 20 MG tablet Take 1 tablet (20 mg total) by mouth every morning.  90 tablet  2  . hydroxyurea (HYDREA) 500 MG capsule Take 1 capsule (500 mg total) by mouth daily after breakfast.  90 capsule  4  . levothyroxine (SYNTHROID, LEVOTHROID) 88 MCG tablet TAKE 1 TABLET (88 MCG TOTAL)      BY MOUTH DAILY BEFORE BREAKFAST.  30 tablet  1  . loratadine (CLARITIN) 10 MG tablet Take 1  tablet (10 mg total) by mouth daily as needed for allergies. Pruritus, allergies  30 tablet  11  . Multiple Vitamin (MULTIVITAMIN PO) Take 1 tablet by mouth every morning.       . naproxen (NAPROSYN) 500 MG tablet TAKE ONE TABLET BY MOUTH TWICE A DAY WITH MEALS  180 tablet  0  . NEOMYCIN-POLYMYXIN-HYDROCORTISONE (CORTISPORIN) 1 % SOLN otic solution Place 3 drops into both ears at bedtime as needed. Wax, itching  10 mL  0  . Omega-3 Fatty Acids (FISH OIL) 1000 MG CAPS Take by mouth 2 (two) times daily.      Marland Kitchen omeprazole (PRILOSEC) 40 MG capsule Take 1 capsule (40 mg total) by mouth 2 (two) times daily.  60 capsule  3  . ondansetron (ZOFRAN) 8 MG tablet Take 1 tablet (8 mg total) by mouth every 8 (eight) hours as needed for nausea or vomiting.  20 tablet  2  . Probiotic Product (PROBIOTIC DAILY PO) Take by mouth 2 (two) times daily.      . ranitidine (ZANTAC) 300 MG tablet TAKE 1 TABLET (300 MG TOTAL)    BY MOUTH AT BEDTIME AS NEEDEDFORHEARTBU  RN  30 tablet  1  . venlafaxine XR (EFFEXOR-XR) 150 MG 24 hr capsule TAKE ONE CAPSULE BY MOUTH ONE TIME DAILY  90 capsule  1  . Vitamin D, Ergocalciferol, (DRISDOL) 50000 UNITS CAPS capsule TAKE 1 CAPSULE BY MOUTH EVERY OTHER WEEK AS INSTRUCTED  6 capsule  2   No current facility-administered medications on file prior to visit.    No Known Allergies  Review of Systems  Review of Systems  Constitutional: Negative for fever and malaise/fatigue.  HENT: Negative for congestion.   Eyes: Negative for discharge.  Respiratory: Negative for shortness of breath.   Cardiovascular: Negative for chest pain, palpitations and leg swelling.  Gastrointestinal: Negative for nausea, abdominal pain and diarrhea.  Genitourinary: Negative for dysuria.  Musculoskeletal: Negative for falls.  Skin: Negative for rash.  Neurological: Negative for loss of consciousness and headaches.  Endo/Heme/Allergies: Negative for polydipsia.  Psychiatric/Behavioral: Negative for  depression and suicidal ideas. The patient is not nervous/anxious and does not have insomnia.     Objective  BP 112/64  Pulse 82  Temp(Src) 98.1 F (36.7 C) (Oral)  Ht 5\' 3"  (1.6 m)  Wt 116 lb  1.9 oz (52.672 kg)  BMI 20.58 kg/m2  SpO2 96%  LMP 07/31/1990  Physical Exam  Physical Exam  Constitutional: She is oriented to person, place, and time and well-developed, well-nourished, and in no distress. No distress.  HENT:  Head: Normocephalic and atraumatic.  Eyes: Conjunctivae are normal.  Neck: Neck supple. No thyromegaly present.  Cardiovascular: Normal rate, regular rhythm and normal heart sounds.   No murmur heard. Pulmonary/Chest: Effort normal and breath sounds normal. She has no wheezes.  Abdominal: She exhibits no distension and no mass.  Musculoskeletal: She exhibits no edema.  Lymphadenopathy:    She has no cervical adenopathy.  Neurological: She is alert and oriented to person, place, and time.  Skin: Skin is warm and dry. No rash noted. She is not diaphoretic.  Psychiatric: Memory, affect and judgment normal.    Lab Results  Component Value Date   TSH 2.588 04/18/2013   Lab Results  Component Value Date   WBC 2.3* 01/09/2014   HGB 10.7* 01/09/2014   HCT 31.8* 01/09/2014   MCV 121* 01/09/2014   PLT 257 01/09/2014   Lab Results  Component Value Date   CREATININE 0.7 12/08/2013   BUN 19 12/08/2013   NA 136 12/08/2013   K 3.3 12/08/2013   CL 101 12/08/2013   CO2 30 12/08/2013   Lab Results  Component Value Date   ALT 14 12/08/2013   AST 23 12/08/2013   ALKPHOS 63 12/08/2013   BILITOT 0.40 12/08/2013   Lab Results  Component Value Date   CHOL 169 04/18/2013   Lab Results  Component Value Date   HDL 53 04/18/2013   Lab Results  Component Value Date   LDLCALC 98 04/18/2013   Lab Results  Component Value Date   TRIG 92 04/18/2013   Lab Results  Component Value Date   CHOLHDL 3.2 04/18/2013     Assessment & Plan  UTI (urinary tract  infection) Asymptomatic but will recheck UA, urine culture  Esophageal reflux Avoid offending foods, start probiotics. Do not eat large meals in late evening and consider raising head of bed.   Unspecified constipation Moving bowels daily with probiotic, hydration, citracel bid  Other and unspecified hyperlipidemia Encouraged heart healthy diet, increase exercise, avoid trans fats, consider a krill oil cap daily  Hypothyroid On Levothyroxine, continue to monitor  Macrocytic anemia Follows with hematology, no changes

## 2014-01-14 NOTE — Patient Instructions (Signed)

## 2014-01-14 NOTE — Assessment & Plan Note (Signed)
Asymptomatic but will recheck UA, urine culture

## 2014-01-14 NOTE — Assessment & Plan Note (Signed)
Avoid offending foods, start probiotics. Do not eat large meals in late evening and consider raising head of bed.  

## 2014-01-14 NOTE — Assessment & Plan Note (Signed)
Moving bowels daily with probiotic, hydration, citracel bid

## 2014-01-14 NOTE — Progress Notes (Signed)
Pre visit review using our clinic review tool, if applicable. No additional management support is needed unless otherwise documented below in the visit note. 

## 2014-01-15 ENCOUNTER — Other Ambulatory Visit: Payer: Self-pay | Admitting: Family Medicine

## 2014-01-15 LAB — URINALYSIS
Bilirubin Urine: NEGATIVE
Glucose, UA: NEGATIVE mg/dL
Hgb urine dipstick: NEGATIVE
Ketones, ur: NEGATIVE mg/dL
Leukocytes, UA: NEGATIVE
Nitrite: NEGATIVE
Protein, ur: NEGATIVE mg/dL
Specific Gravity, Urine: 1.019 (ref 1.005–1.030)
Urobilinogen, UA: 0.2 mg/dL (ref 0.0–1.0)
pH: 5 (ref 5.0–8.0)

## 2014-01-15 LAB — URINE CULTURE
Colony Count: NO GROWTH
Organism ID, Bacteria: NO GROWTH

## 2014-01-16 ENCOUNTER — Ambulatory Visit: Payer: BC Managed Care – PPO | Admitting: Hematology & Oncology

## 2014-01-16 ENCOUNTER — Other Ambulatory Visit: Payer: BC Managed Care – PPO | Admitting: Lab

## 2014-01-16 ENCOUNTER — Other Ambulatory Visit: Payer: Self-pay | Admitting: Family Medicine

## 2014-01-19 NOTE — Telephone Encounter (Signed)
Rx request to pharmacy/[facility]  

## 2014-01-19 NOTE — Telephone Encounter (Signed)
Refill request for Celexa Last filled by MD on - 04/18/2014 #90 x3 Last Appt: 01/14/2014 Next Appt: 03/30/2014  Please advise refill?

## 2014-01-22 NOTE — Assessment & Plan Note (Signed)
On Levothyroxine, continue to monitor 

## 2014-01-22 NOTE — Assessment & Plan Note (Signed)
Encouraged heart healthy diet, increase exercise, avoid trans fats, consider a krill oil cap daily 

## 2014-01-22 NOTE — Assessment & Plan Note (Signed)
Follows with hematology, no changes

## 2014-02-02 ENCOUNTER — Other Ambulatory Visit: Payer: Self-pay | Admitting: Family Medicine

## 2014-02-02 NOTE — Telephone Encounter (Signed)
Please advise refill?  Last RX was done on 08-14-13 quantity 90 with 3 refills  If ok fax to [phone] or is this one that needs to be picked up?

## 2014-02-02 NOTE — Telephone Encounter (Signed)
rx faxed

## 2014-02-22 ENCOUNTER — Other Ambulatory Visit: Payer: Self-pay | Admitting: Family Medicine

## 2014-03-04 ENCOUNTER — Other Ambulatory Visit: Payer: Self-pay | Admitting: Family Medicine

## 2014-03-11 ENCOUNTER — Other Ambulatory Visit: Payer: Self-pay | Admitting: Family Medicine

## 2014-03-16 ENCOUNTER — Other Ambulatory Visit: Payer: Self-pay | Admitting: Family Medicine

## 2014-03-16 NOTE — Telephone Encounter (Signed)
Rx sent to pharmacy. Lrd 01/19/2014 Lov 01/14/2014  LDM

## 2014-03-20 ENCOUNTER — Other Ambulatory Visit: Payer: BC Managed Care – PPO | Admitting: Lab

## 2014-03-20 ENCOUNTER — Encounter: Payer: Self-pay | Admitting: Hematology & Oncology

## 2014-03-20 ENCOUNTER — Other Ambulatory Visit (HOSPITAL_BASED_OUTPATIENT_CLINIC_OR_DEPARTMENT_OTHER): Payer: BC Managed Care – PPO | Admitting: Lab

## 2014-03-20 ENCOUNTER — Ambulatory Visit (INDEPENDENT_AMBULATORY_CARE_PROVIDER_SITE_OTHER): Payer: BC Managed Care – PPO | Admitting: Physician Assistant

## 2014-03-20 ENCOUNTER — Encounter: Payer: Self-pay | Admitting: Physician Assistant

## 2014-03-20 ENCOUNTER — Ambulatory Visit (HOSPITAL_BASED_OUTPATIENT_CLINIC_OR_DEPARTMENT_OTHER): Payer: BC Managed Care – PPO | Admitting: Hematology & Oncology

## 2014-03-20 VITALS — HR 58 | Temp 98.0°F | Resp 14 | Ht 63.0 in | Wt 118.5 lb

## 2014-03-20 VITALS — BP 126/68 | HR 73 | Temp 98.0°F | Resp 14 | Ht 63.0 in | Wt 119.0 lb

## 2014-03-20 DIAGNOSIS — D473 Essential (hemorrhagic) thrombocythemia: Secondary | ICD-10-CM

## 2014-03-20 DIAGNOSIS — R82998 Other abnormal findings in urine: Secondary | ICD-10-CM

## 2014-03-20 DIAGNOSIS — R031 Nonspecific low blood-pressure reading: Secondary | ICD-10-CM

## 2014-03-20 DIAGNOSIS — R399 Unspecified symptoms and signs involving the genitourinary system: Secondary | ICD-10-CM

## 2014-03-20 DIAGNOSIS — N3000 Acute cystitis without hematuria: Secondary | ICD-10-CM

## 2014-03-20 DIAGNOSIS — R8290 Unspecified abnormal findings in urine: Secondary | ICD-10-CM

## 2014-03-20 DIAGNOSIS — R3989 Other symptoms and signs involving the genitourinary system: Secondary | ICD-10-CM

## 2014-03-20 LAB — POCT URINALYSIS DIPSTICK
Bilirubin, UA: NEGATIVE
Glucose, UA: NEGATIVE
Ketones, UA: NEGATIVE
Nitrite, UA: POSITIVE
Protein, UA: NEGATIVE
Spec Grav, UA: <=1.005
Urobilinogen, UA: 0.2
pH, UA: 7

## 2014-03-20 LAB — IRON AND TIBC CHCC
%SAT: 29 % (ref 21–57)
Iron: 67 ug/dL (ref 41–142)
TIBC: 229 ug/dL — ABNORMAL LOW (ref 236–444)
UIBC: 161 ug/dL (ref 120–384)

## 2014-03-20 LAB — COMPREHENSIVE METABOLIC PANEL
ALT: 8 U/L (ref 0–35)
AST: 18 U/L (ref 0–37)
Albumin: 4.2 g/dL (ref 3.5–5.2)
Alkaline Phosphatase: 52 U/L (ref 39–117)
BUN: 15 mg/dL (ref 6–23)
CO2: 31 mEq/L (ref 19–32)
Calcium: 8.8 mg/dL (ref 8.4–10.5)
Chloride: 103 mEq/L (ref 96–112)
Creatinine, Ser: 0.65 mg/dL (ref 0.50–1.10)
Glucose, Bld: 82 mg/dL (ref 70–99)
Potassium: 5 mEq/L (ref 3.5–5.3)
Sodium: 137 mEq/L (ref 135–145)
Total Bilirubin: 0.2 mg/dL (ref 0.2–1.2)
Total Protein: 6.4 g/dL (ref 6.0–8.3)

## 2014-03-20 LAB — CBC WITH DIFFERENTIAL (CANCER CENTER ONLY)
BASO#: 0 10*3/uL (ref 0.0–0.2)
BASO%: 0.5 % (ref 0.0–2.0)
EOS%: 3.8 % (ref 0.0–7.0)
Eosinophils Absolute: 0.1 10*3/uL (ref 0.0–0.5)
HCT: 36.6 % (ref 34.8–46.6)
HGB: 12.3 g/dL (ref 11.6–15.9)
LYMPH#: 0.9 10*3/uL (ref 0.9–3.3)
LYMPH%: 24.3 % (ref 14.0–48.0)
MCH: 41.3 pg — ABNORMAL HIGH (ref 26.0–34.0)
MCHC: 33.6 g/dL (ref 32.0–36.0)
MCV: 123 fL — ABNORMAL HIGH (ref 81–101)
MONO#: 0.2 10*3/uL (ref 0.1–0.9)
MONO%: 6.2 % (ref 0.0–13.0)
NEUT#: 2.4 10*3/uL (ref 1.5–6.5)
NEUT%: 65.2 % (ref 39.6–80.0)
Platelets: 453 10*3/uL — ABNORMAL HIGH (ref 145–400)
RBC: 2.98 10*6/uL — ABNORMAL LOW (ref 3.70–5.32)
RDW: 11.2 % (ref 11.1–15.7)
WBC: 3.7 10*3/uL — ABNORMAL LOW (ref 3.9–10.0)

## 2014-03-20 LAB — CHCC SATELLITE - SMEAR

## 2014-03-20 LAB — FERRITIN CHCC: Ferritin: 221 ng/ml (ref 9–269)

## 2014-03-20 MED ORDER — CEFDINIR 300 MG PO CAPS: 300.0000 mg | ORAL_CAPSULE | Freq: Two times a day (BID) | ORAL | Status: DC

## 2014-03-20 NOTE — Assessment & Plan Note (Signed)
Urine dip with LE and nitrites.  Will send for culture.  Will empirically treat with Cefdinir 300 mg BID x 5 days. Will alter antibiotic if indicated by culture. Increase fluids.  Daily probiotic and cranberry supplement.  Return precautions discussed with patient.

## 2014-03-20 NOTE — Progress Notes (Signed)
Patient presents to clinic today c/o urinary urgency, frequency and dysuria over the past couple of days.  Denies hematuria, fever, chills, nausea or vomiting.  Has had some very mild low back pain this morning.    Past Medical History  Diagnosis Date  . Arthritis   . Allergy   . Thyroid disease   . Diverticulitis   . Frequent episodic tension-type headache   . Hypertension   . Seizure     childhood  . Anemia, iron deficiency 07/08/2012  . Depression with anxiety 03/24/2011  . Depression   . GERD (gastroesophageal reflux disease)   . Essential thrombocythemia 01/24/2011  . H. pylori infection 12/19/2012  . Other and unspecified hyperlipidemia 02/25/2013  . Preventative health care 04/20/2013  . Cerumen impaction 04/28/2013  . 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 Esophageal reflux 08/17/2013    Current Outpatient Prescriptions on File Prior to Visit  Medication Sig Dispense Refill  . acetaminophen-codeine (TYLENOL #3) 300-30 MG per tablet TAKE 1 TABLET BY MOUTH EVERY 6 HOURS AS NEEDED FOR MODERATE TO SEVERE PAIN  90 tablet  2  . ALPRAZolam (XANAX) 1 MG tablet Take 1 tablet (1 mg total) by mouth 3 (three) times daily as needed for sleep or anxiety.  90 tablet  3  . aspirin EC 81 MG tablet Take 81 mg by mouth every morning.      . citalopram (CELEXA) 40 MG tablet 1 daily      . estradiol (ESTRACE) 0.5 MG tablet TAKE ONE TABLET BY MOUTH TWICE DAILY  180 tablet  0  . fluorometholone (FML) 0.1 % ophthalmic suspension Place 1 drop into both eyes daily as needed (For dry eyes.).       Marland Kitchen furosemide (LASIX) 20 MG tablet Take 1 tablet (20 mg total) by mouth every morning.  90 tablet  2  . hydroxyurea (HYDREA) 500 MG capsule Take 1 capsule (500 mg total) by mouth daily after breakfast.  90 capsule  4  . levothyroxine (SYNTHROID, LEVOTHROID) 88 MCG tablet TAKE ONE TABLET BY MOUTH ONE TIME DAILY BEFORE BREAKFAST  30 tablet  0  . loratadine (CLARITIN) 10 MG tablet Take 1 tablet (10 mg total) by mouth daily as  needed for allergies. Pruritus, allergies  30 tablet  11  . Multiple Vitamin (MULTIVITAMIN PO) Take 1 tablet by mouth every morning.       . naproxen (NAPROSYN) 500 MG tablet TAKE ONE TABLET BY MOUTH TWICE A DAY WITH MEALS  180 tablet  1  . Omega-3 Fatty Acids (FISH OIL) 1000 MG CAPS Take by mouth 2 (two) times daily.      Marland Kitchen omeprazole (PRILOSEC) 40 MG capsule Take 1 capsule (40 mg total) by mouth 2 (two) times daily.  60 capsule  3  . ondansetron (ZOFRAN) 8 MG tablet Take 1 tablet (8 mg total) by mouth every 8 (eight) hours as needed for nausea or vomiting.  20 tablet  2  . Probiotic Product (PROBIOTIC DAILY PO) Take by mouth 2 (two) times daily.      . ranitidine (ZANTAC) 300 MG tablet TAKE 1 TABLET BY MOUTH AT BEDTIME AS NEEDED FOR HEARTBURN  30 tablet  3  . venlafaxine XR (EFFEXOR-XR) 150 MG 24 hr capsule TAKE ONE CAPSULE BY MOUTH ONE TIME DAILY  90 capsule  1  . Vitamin D, Ergocalciferol, (DRISDOL) 50000 UNITS CAPS capsule TAKE 1 CAPSULE BY MOUTH EVERY OTHER WEEK AS INSTRUCTED  6 capsule  2   No current facility-administered medications on  file prior to visit.    No Known Allergies  Family History  Problem Relation Age of Onset  . Arthritis Mother   . Cancer Mother     ovarian  . Hypertension Mother   . Heart disease Mother     pacer  . Heart failure Mother   . Arthritis Father   . Cancer Father     lung  . Hypertension Father   . Cancer Sister     lung  . Cancer Brother     prostate  . Cancer Brother     lung  . Hypertension Son   . COPD Brother   . Heart disease Brother   . Hypertension Brother   . Alcohol abuse Brother   . Cirrhosis Brother   . Seizures Sister     History   Social History  . Marital Status: Widowed    Spouse Name: N/A    Number of Children: 2  . Years of Education: N/A   Occupational History  . APL operator     [employer]   Social History Main Topics  . Smoking status: Former Smoker -- 3.50 packs/day for 20 years    Types: Cigarettes     Start date: 11/15/1970    Quit date: 07/31/1990  . Smokeless tobacco: Never Used     Comment: quit 23 years ago  . Alcohol Use: No  . Drug Use: No  . Sexual Activity: No   Other Topics Concern  . None   Social History Narrative   Regular exercise: no   Caffeine Use: 2-3 weekly   Review of Systems - See HPI.  All other ROS are negative.  Pulse 58  Temp(Src) 98 F (36.7 C) (Oral)  Resp 14  Ht 5\' 3"  (1.6 m)  Wt 118 lb 8 oz (53.751 kg)  BMI 21.00 kg/m2  SpO2 97%  LMP 07/31/1990  Physical Exam  Vitals reviewed. Constitutional: She is oriented to person, place, and time and well-developed, well-nourished, and in no distress.  HENT:  Head: Normocephalic and atraumatic.  Eyes: Conjunctivae are normal.  Cardiovascular: Normal rate, regular rhythm, normal heart sounds and intact distal pulses.   Pulmonary/Chest: Effort normal and breath sounds normal. No respiratory distress. She has no wheezes. She has no rales. She exhibits no tenderness.  Abdominal: Soft. Bowel sounds are normal. There is no tenderness.  Negative CVA tenderness.  Neurological: She is alert and oriented to person, place, and time.  Skin: Skin is warm and dry. No rash noted.  Psychiatric: Affect normal.    Recent Results (from the past 2160 hour(s))  CBC WITH DIFFERENTIAL ([facility])     Status: Abnormal   Collection Time    01/09/14  8:24 AM      Result Value Ref Range   WBC 2.3 (*) 3.9 - 10.0 10e3/uL   RBC 2.62 (*) 3.70 - 5.32 10e6/uL   HGB 10.7 (*) 11.6 - 15.9 g/dL   HCT 31.8 (*) 34.8 - 46.6 %   MCV 121 (*) 81 - 101 fL   MCH 40.8 (*) 26.0 - 34.0 pg   MCHC 33.6  32.0 - 36.0 g/dL   RDW 14.7  11.1 - 15.7 %   Platelets 257  145 - 400 10e3/uL   NEUT# 1.3 (*) 1.5 - 6.5 10e3/uL   LYMPH# 0.7 (*) 0.9 - 3.3 10e3/uL   MONO# 0.1  0.1 - 0.9 10e3/uL   Eosinophils Absolute 0.2  0.0 - 0.5 10e3/uL   BASO# 0.0  0.0 - 0.2 10e3/uL   NEUT% 54.4  39.6 - 80.0 %   LYMPH% 30.5  14.0 - 48.0 %   MONO% 6.0  0.0  - 13.0 %   EOS% 8.2 (*) 0.0 - 7.0 %   BASO% 0.9  0.0 - 2.0 %  IRON AND TIBC [facility]     Status: Abnormal   Collection Time    01/09/14  8:24 AM      Result Value Ref Range   Iron 117  41 - 142 ug/dL   TIBC 208 (*) 236 - 444 ug/dL   UIBC 91 (*) 120 - 384 ug/dL   %SAT 56  21 - 57 %  FERRITIN [facility]     Status: None   Collection Time    01/09/14  8:24 AM      Result Value Ref Range   Ferritin 236  9 - 269 ng/ml  LACTATE DEHYDROGENASE     Status: None   Collection Time    01/09/14  8:24 AM      Result Value Ref Range   LDH 139  94 - 250 U/L  PREALBUMIN     Status: None   Collection Time    01/09/14  8:24 AM      Result Value Ref Range   Prealbumin 25.0  17.0 - 34.0 mg/dL  URINE CULTURE     Status: None   Collection Time    01/14/14  8:46 AM      Result Value Ref Range   Colony Count NO GROWTH     Organism ID, Bacteria NO GROWTH    URINALYSIS     Status: None   Collection Time    01/14/14  8:47 AM      Result Value Ref Range   Color, Urine YELLOW  YELLOW   APPearance CLEAR  CLEAR   Specific Gravity, Urine 1.019  1.005 - 1.030   pH 5.0  5.0 - 8.0   Glucose, UA NEG  NEG mg/dL   Bilirubin Urine NEG  NEG   Ketones, ur NEG  NEG mg/dL   Hgb urine dipstick NEG  NEG   Protein, ur NEG  NEG mg/dL   Urobilinogen, UA 0.2  0.0 - 1.0 mg/dL   Nitrite NEG  NEG   Leukocytes, UA NEG  NEG  CBC WITH DIFFERENTIAL ([facility])     Status: Abnormal   Collection Time    03/20/14  9:37 AM      Result Value Ref Range   WBC 3.7 (*) 3.9 - 10.0 10e3/uL   RBC 2.98 (*) 3.70 - 5.32 10e6/uL   HGB 12.3  11.6 - 15.9 g/dL   HCT 36.6  34.8 - 46.6 %   MCV 123 (*) 81 - 101 fL   MCH 41.3 (*) 26.0 - 34.0 pg   MCHC 33.6  32.0 - 36.0 g/dL   RDW 11.2  11.1 - 15.7 %   Platelets 453 (*) 145 - 400 10e3/uL   NEUT# 2.4  1.5 - 6.5 10e3/uL   LYMPH# 0.9  0.9 - 3.3 10e3/uL   MONO# 0.2  0.1 - 0.9 10e3/uL   Eosinophils Absolute 0.1  0.0 - 0.5 10e3/uL   BASO# 0.0  0.0 - 0.2 10e3/uL   NEUT% 65.2  39.6 - 80.0 %    LYMPH% 24.3  14.0 - 48.0 %   MONO% 6.2  0.0 - 13.0 %   EOS% 3.8  0.0 - 7.0 %   BASO% 0.5  0.0 -  2.0 %  [facility] - SMEAR     Status: None   Collection Time    03/20/14  9:37 AM      Result Value Ref Range   Smear Result Smear Available    POCT URINALYSIS DIPSTICK     Status: Abnormal   Collection Time    03/20/14 10:08 AM      Result Value Ref Range   Color, UA yellow     Clarity, UA cloudy     Glucose, UA neg     Bilirubin, UA neg     Ketones, UA neg     Spec Grav, UA <=1.005     Blood, UA trace     pH, UA 7.0     Protein, UA neg     Urobilinogen, UA 0.2     Nitrite, UA positive     Leukocytes, UA moderate (2+)      Assessment/Plan: Acute cystitis without hematuria Urine dip with LE and nitrites.  Will send for culture.  Will empirically treat with Cefdinir 300 mg BID x 5 days. Will alter antibiotic if indicated by culture. Increase fluids.  Daily probiotic and cranberry supplement.  Return precautions discussed with patient.

## 2014-03-20 NOTE — Progress Notes (Signed)
Hematology and Oncology Follow Up Visit  GRACEY TOLLE [id number] 1951/05/05 63 y.o. 03/20/2014   Principle Diagnosis:  Essential thrombocythemia - JAK2 negative. 2. Intermittent iron-deficiency anemia.  Current Therapy:   Hydrea 500 mg by mouth daily      Interim History:  Ms.  Ewalt is back for followup. She actually looks much better. She feels a little better. She recently had a viral infection. She was in bed most of the day. However, she is feeling better.  She has thought about retiring. However, she thought that she probably would do better at work.  Her appetite is better. She's not had any nausea or vomiting. She's had no abdominal pain. There is no cough. There has been no change in bowel or bladder habits. Her last iron studies back in June showed a ferritin of 236 with an iron saturation 56 %.  Medications: Current outpatient prescriptions:acetaminophen-codeine (TYLENOL #3) 300-30 MG per tablet, TAKE 1 TABLET BY MOUTH EVERY 6 HOURS AS NEEDED FOR MODERATE TO SEVERE PAIN, Disp: 90 tablet, Rfl: 2;  ALPRAZolam (XANAX) 1 MG tablet, Take 1 tablet (1 mg total) by mouth 3 (three) times daily as needed for sleep or anxiety., Disp: 90 tablet, Rfl: 3;  aspirin EC 81 MG tablet, Take 81 mg by mouth every morning., Disp: , Rfl:  citalopram (CELEXA) 40 MG tablet, 1 daily, Disp: , Rfl: ;  estradiol (ESTRACE) 0.5 MG tablet, TAKE ONE TABLET BY MOUTH TWICE DAILY, Disp: 180 tablet, Rfl: 0;  fluorometholone (FML) 0.1 % ophthalmic suspension, Place 1 drop into both eyes daily as needed (For dry eyes.). , Disp: , Rfl: ;  furosemide (LASIX) 20 MG tablet, Take 1 tablet (20 mg total) by mouth every morning., Disp: 90 tablet, Rfl: 2 hydroxyurea (HYDREA) 500 MG capsule, Take 1 capsule (500 mg total) by mouth daily after breakfast., Disp: 90 capsule, Rfl: 4;  levothyroxine (SYNTHROID, LEVOTHROID) 88 MCG tablet, TAKE ONE TABLET BY MOUTH ONE TIME DAILY BEFORE BREAKFAST, Disp: 30 tablet, Rfl: 0;  loratadine  (CLARITIN) 10 MG tablet, Take 1 tablet (10 mg total) by mouth daily as needed for allergies. Pruritus, allergies, Disp: 30 tablet, Rfl: 11 Multiple Vitamin (MULTIVITAMIN PO), Take 1 tablet by mouth every morning. , Disp: , Rfl: ;  naproxen (NAPROSYN) 500 MG tablet, TAKE ONE TABLET BY MOUTH TWICE A DAY WITH MEALS, Disp: 180 tablet, Rfl: 1;  Omega-3 Fatty Acids (FISH OIL) 1000 MG CAPS, Take by mouth 2 (two) times daily., Disp: , Rfl: ;  omeprazole (PRILOSEC) 40 MG capsule, Take 1 capsule (40 mg total) by mouth 2 (two) times daily., Disp: 60 capsule, Rfl: 3 ondansetron (ZOFRAN) 8 MG tablet, Take 1 tablet (8 mg total) by mouth every 8 (eight) hours as needed for nausea or vomiting., Disp: 20 tablet, Rfl: 2;  Probiotic Product (PROBIOTIC DAILY PO), Take by mouth 2 (two) times daily., Disp: , Rfl: ;  ranitidine (ZANTAC) 300 MG tablet, TAKE 1 TABLET BY MOUTH AT BEDTIME AS NEEDED FOR HEARTBURN, Disp: 30 tablet, Rfl: 3 venlafaxine XR (EFFEXOR-XR) 150 MG 24 hr capsule, TAKE ONE CAPSULE BY MOUTH ONE TIME DAILY, Disp: 90 capsule, Rfl: 1;  Vitamin D, Ergocalciferol, (DRISDOL) 50000 UNITS CAPS capsule, TAKE 1 CAPSULE BY MOUTH EVERY OTHER WEEK AS INSTRUCTED, Disp: 6 capsule, Rfl: 2  Allergies: No Known Allergies  Past Medical History, Surgical history, Social history, and Family History were reviewed and updated.  Review of Systems: As above  Physical Exam:  height is 5\' 3"  (1.6 m) and  weight is 119 lb (53.978 kg). Her oral temperature is 98 F (36.7 C). Her blood pressure is 126/68 and her pulse is 73. Her respiration is 14.   Thin white female. Lungs are clear. Cardiac exam regular rate and rhythm. No murmurs. Abdomen is soft. She has good bowel sounds. There is no fluid wave. There is no palpable liver or spleen. Back exam no tenderness over the spine. Extremities shows mild chronic nonpitting edema . Skin exam no rashes. Neurological exam no focal deficits.  Lab Results  Component Value Date   WBC 2.3*  01/09/2014   HGB 10.7* 01/09/2014   HCT 31.8* 01/09/2014   MCV 121* 01/09/2014   PLT 257 01/09/2014     Chemistry      Component Value Date/Time   NA 136 12/08/2013 0927   NA 139 11/24/2013 0912   K 3.3 12/08/2013 0927   K 4.3 11/24/2013 0912   CL 101 12/08/2013 0927   CL 104 11/24/2013 0912   CO2 30 12/08/2013 0927   CO2 29 11/24/2013 0912   BUN 19 12/08/2013 0927   BUN 13 11/24/2013 0912   CREATININE 0.7 12/08/2013 0927   CREATININE 0.71 11/14/2013 0815      Component Value Date/Time   CALCIUM 8.6 12/08/2013 0927   CALCIUM 9.3 11/24/2013 0912   ALKPHOS 63 12/08/2013 0927   ALKPHOS 74 11/24/2013 0912   AST 23 12/08/2013 0927   AST 17 11/24/2013 0912   ALT 14 12/08/2013 0927   ALT 10 11/24/2013 0912   BILITOT 0.40 12/08/2013 0927   BILITOT 0.2 11/24/2013 0912         Impression and Plan: Ms. Chesterfield is 63 year old white female with essential thrombocythemia.  We will see what her blood work shows. For some reason, he was not yet done.  I'm just happy that she is gaining weight. I think that her blood counts to be better.  We will see her iron studies show.  Her last prealbumin was holding steady.  We will plan to see her back in another 2-3 months.  [name], MD 8/21/20159:16 AM

## 2014-03-20 NOTE — Progress Notes (Signed)
Pre visit review using our clinic review tool, if applicable. No additional management support is needed unless otherwise documented below in the visit note/SLS  

## 2014-03-20 NOTE — Patient Instructions (Signed)
Please take antibiotic as directed.  Increase your fluid intake.  Rest. Take your probiotic daily.  Take a daily cranberry supplement.  I will call you with your urine culture results.  We will alter your antibiotic if indicated by results.  Urinary Tract Infection Urinary tract infections (UTIs) can develop anywhere along your urinary tract. Your urinary tract is your body's drainage system for removing wastes and extra water. Your urinary tract includes two kidneys, two ureters, a bladder, and a urethra. Your kidneys are a pair of bean-shaped organs. Each kidney is about the size of your fist. They are located below your ribs, one on each side of your spine. CAUSES Infections are caused by microbes, which are microscopic organisms, including fungi, viruses, and bacteria. These organisms are so small that they can only be seen through a microscope. Bacteria are the microbes that most commonly cause UTIs. SYMPTOMS  Symptoms of UTIs may vary by age and gender of the patient and by the location of the infection. Symptoms in young women typically include a frequent and intense urge to urinate and a painful, burning feeling in the bladder or urethra during urination. Older women and men are more likely to be tired, shaky, and weak and have muscle aches and abdominal pain. A fever may mean the infection is in your kidneys. Other symptoms of a kidney infection include pain in your back or sides below the ribs, nausea, and vomiting. DIAGNOSIS To diagnose a UTI, your caregiver will ask you about your symptoms. Your caregiver also will ask to provide a urine sample. The urine sample will be tested for bacteria and white blood cells. White blood cells are made by your body to help fight infection. TREATMENT  Typically, UTIs can be treated with medication. Because most UTIs are caused by a bacterial infection, they usually can be treated with the use of antibiotics. The choice of antibiotic and length of treatment  depend on your symptoms and the type of bacteria causing your infection. HOME CARE INSTRUCTIONS  If you were prescribed antibiotics, take them exactly as your caregiver instructs you. Finish the medication even if you feel better after you have only taken some of the medication.  Drink enough water and fluids to keep your urine clear or pale yellow.  Avoid caffeine, tea, and carbonated beverages. They tend to irritate your bladder.  Empty your bladder often. Avoid holding urine for long periods of time.  Empty your bladder before and after sexual intercourse.  After a bowel movement, women should cleanse from front to back. Use each tissue only once. SEEK MEDICAL CARE IF:   You have back pain.  You develop a fever.  Your symptoms do not begin to resolve within 3 days. SEEK IMMEDIATE MEDICAL CARE IF:   You have severe back pain or lower abdominal pain.  You develop chills.  You have nausea or vomiting.  You have continued burning or discomfort with urination. MAKE SURE YOU:   Understand these instructions.  Will watch your condition.  Will get help right away if you are not doing well or get worse. Document Released: 04/26/2005 Document Revised: 01/16/2012 Document Reviewed: 08/25/2011 [facility] Patient Information 2015 [place], [place]. This information is not intended to replace advice given to you by your health care provider. Make sure you discuss any questions you have with your health care provider.

## 2014-03-23 ENCOUNTER — Telehealth: Payer: Self-pay | Admitting: *Deleted

## 2014-03-23 LAB — CULTURE, URINE COMPREHENSIVE: Colony Count: >=100000

## 2014-03-23 NOTE — Telephone Encounter (Signed)
Message copied by Rico Ala on Mon Mar 23, 2014  2:32 PM ------      Message from: [name]      Created: Sun Mar 22, 2014  2:21 PM       Call - platelet count is up a little, but do NOT change dose of Hydrea.  Iron level is ok!!! pete ------

## 2014-03-24 ENCOUNTER — Telehealth: Payer: Self-pay | Admitting: Family Medicine

## 2014-03-24 NOTE — Telephone Encounter (Signed)
Pt is returning call.  

## 2014-03-25 NOTE — Telephone Encounter (Signed)
Spoke with pt. She's taken her last abx Tuesday but is still experiencing some burning. She wants to go ahead and call in the Cipro to [facility]. LDM

## 2014-03-25 NOTE — Telephone Encounter (Signed)
OK to send in Cipro 250 mg po bid x 5 days, make sure she is taking a probiotic

## 2014-03-26 MED ORDER — CIPROFLOXACIN HCL 250 MG PO TABS: 250.0000 mg | ORAL_TABLET | Freq: Two times a day (BID) | ORAL | Status: DC

## 2014-03-26 NOTE — Telephone Encounter (Signed)
RX sent and phone just rang

## 2014-03-26 NOTE — Telephone Encounter (Signed)
Pt calling requesting medicine be sent asap, please call pt once sent [phone]. If she does not answer please leave voicemail once sent.

## 2014-03-30 ENCOUNTER — Ambulatory Visit: Payer: BC Managed Care – PPO | Admitting: Family Medicine

## 2014-04-01 ENCOUNTER — Other Ambulatory Visit: Payer: Self-pay | Admitting: Family Medicine

## 2014-04-08 ENCOUNTER — Other Ambulatory Visit: Payer: Self-pay | Admitting: Family Medicine

## 2014-04-13 ENCOUNTER — Ambulatory Visit (INDEPENDENT_AMBULATORY_CARE_PROVIDER_SITE_OTHER): Payer: BC Managed Care – PPO

## 2014-04-13 ENCOUNTER — Ambulatory Visit (INDEPENDENT_AMBULATORY_CARE_PROVIDER_SITE_OTHER): Payer: BC Managed Care – PPO | Admitting: Family Medicine

## 2014-04-13 VITALS — BP 100/65 | HR 85 | Temp 97.5°F | Ht 63.0 in | Wt 120.4 lb

## 2014-04-13 DIAGNOSIS — R059 Cough, unspecified: Secondary | ICD-10-CM

## 2014-04-13 DIAGNOSIS — J189 Pneumonia, unspecified organism: Secondary | ICD-10-CM

## 2014-04-13 DIAGNOSIS — R05 Cough: Secondary | ICD-10-CM

## 2014-04-13 DIAGNOSIS — R509 Fever, unspecified: Secondary | ICD-10-CM

## 2014-04-13 LAB — POCT CBC
Granulocyte percent: 90.1 %G — AB (ref 37–80)
HCT, POC: 32.2 % — AB (ref 37.7–47.9)
Hemoglobin: 10.4 g/dL — AB (ref 12.2–16.2)
Lymph, poc: 0.7 (ref 0.6–3.4)
MCH, POC: 37.3 pg — AB (ref 27–31.2)
MCHC: 32.2 g/dL (ref 31.8–35.4)
MCV: 115.9 fL — AB (ref 80–97)
MPV: 7.2 fL (ref 0–99.8)
POC Granulocyte: 9.1 — AB (ref 2–6.9)
POC LYMPH PERCENT: 6.8 %L — AB (ref 10–50)
Platelet Count, POC: 534 10*3/uL — AB (ref 142–424)
RBC: 2.8 M/uL — AB (ref 4.04–5.48)
RDW, POC: 13.1 %
WBC: 10.1 10*3/uL (ref 4.6–10.2)

## 2014-04-13 MED ORDER — LEVOFLOXACIN 500 MG PO TABS: 500.0000 mg | ORAL_TABLET | Freq: Every day | ORAL | Status: DC

## 2014-04-13 MED ORDER — HYDROCODONE-HOMATROPINE 5-1.5 MG/5ML PO SYRP: 5.0000 mL | ORAL_SOLUTION | Freq: Three times a day (TID) | ORAL | Status: DC | PRN

## 2014-04-13 NOTE — Progress Notes (Signed)
   Subjective:    Patient ID: Rachael Johnson, female    DOB: 1950/10/25, 63 y.o.   MRN: [id number]  HPI This 63 y.o. female presents for evaluation of fever last night of 102 degrees and right chest pain.  She states she has been having a cough.  She states she has been having fever and she cannot take deep breath because it hurts in her right chest.  She points to her right lateral costal.  She has hx of thrombocytosis and takes medicine rx'd by hematology.   Review of Systems C/o fever and right chest pain No chest pain, SOB, HA, dizziness, vision change, N/V, diarrhea, constipation, dysuria, urinary urgency or frequency, myalgias, arthralgias or rash.     Objective:   Physical Exam Vital signs noted  Chronically ill appearing female in NAD.  HEENT - Head atraumatic Normocephalic                Eyes - PERRLA, Conjuctiva - clear Sclera- Clear EOMI                Ears - EAC's Wnl TM's Wnl Gross Hearing WNL                Nose - Nares patent                 Throat - oropharanx wnl Respiratory - Lungs CTA bilateral Cardiac - RRR S1 and S2 without murmur GI - Abdomen soft Nontender and bowel sounds active x 4 Extremities - No edema. Neuro - Grossly intact.  Results for orders placed in visit on 04/13/14  POCT CBC      Result Value Ref Range   WBC 10.1  4.6 - 10.2 K/uL   Lymph, poc 0.7  0.6 - 3.4   POC LYMPH PERCENT 6.8 (*) 10 - 50 %L   POC Granulocyte 9.1 (*) 2 - 6.9   Granulocyte percent 90.1 (*) 37 - 80 %G   RBC 2.8 (*) 4.04 - 5.48 M/uL   Hemoglobin 10.4 (*) 12.2 - 16.2 g/dL   HCT, POC 32.2 (*) 37.7 - 47.9 %   MCV 115.9 (*) 80 - 97 fL   MCH, POC 37.3 (*) 27 - 31.2 pg   MCHC 32.2  31.8 - 35.4 g/dL   RDW, POC 13.1     Platelet Count, POC 534.0 (*) 142 - 424 K/uL   MPV 7.2  0 - 99.8 fL   CXR - right lung infiltrate Prelimnary reading by Gwyndolyn Saxon Emmali Karow,FNP    Assessment & Plan:  Fever, unspecified - Plan: DG Ribs Unilateral W/Chest Right, POCT CBC,  HYDROcodone-homatropine (HYCODAN) 5-1.5 MG/5ML syrup, levofloxacin (LEVAQUIN) 500 MG tablet  CAP (community acquired pneumonia) - Plan: HYDROcodone-homatropine (HYCODAN) 5-1.5 MG/5ML syrup, levofloxacin (LEVAQUIN) 500 MG tablet po qd x 10 days and push po fluids and rest  And take tylenol and motrin otc as directed for fever and pain.  Lysbeth Penner FNP

## 2014-04-15 ENCOUNTER — Other Ambulatory Visit: Payer: Self-pay | Admitting: Family Medicine

## 2014-04-27 ENCOUNTER — Encounter: Payer: Self-pay | Admitting: Family Medicine

## 2014-04-27 ENCOUNTER — Ambulatory Visit (INDEPENDENT_AMBULATORY_CARE_PROVIDER_SITE_OTHER): Payer: BC Managed Care – PPO | Admitting: Family Medicine

## 2014-04-27 VITALS — BP 123/63 | HR 81 | Temp 98.2°F | Ht 63.0 in | Wt 117.0 lb

## 2014-04-27 DIAGNOSIS — F418 Other specified anxiety disorders: Secondary | ICD-10-CM

## 2014-04-27 DIAGNOSIS — F419 Anxiety disorder, unspecified: Principal | ICD-10-CM

## 2014-04-27 DIAGNOSIS — K219 Gastro-esophageal reflux disease without esophagitis: Secondary | ICD-10-CM

## 2014-04-27 DIAGNOSIS — F341 Dysthymic disorder: Secondary | ICD-10-CM

## 2014-04-27 DIAGNOSIS — F329 Major depressive disorder, single episode, unspecified: Secondary | ICD-10-CM

## 2014-04-27 DIAGNOSIS — D473 Essential (hemorrhagic) thrombocythemia: Secondary | ICD-10-CM

## 2014-04-27 DIAGNOSIS — T7840XA Allergy, unspecified, initial encounter: Secondary | ICD-10-CM | POA: Insufficient documentation

## 2014-04-27 DIAGNOSIS — T7840XD Allergy, unspecified, subsequent encounter: Secondary | ICD-10-CM

## 2014-04-27 DIAGNOSIS — F32A Depression, unspecified: Secondary | ICD-10-CM

## 2014-04-27 MED ORDER — CITALOPRAM HYDROBROMIDE 40 MG PO TABS
40.0000 mg | ORAL_TABLET | ORAL | Status: DC
Start: 2014-04-27 — End: 2014-09-13

## 2014-04-27 MED ORDER — VENLAFAXINE HCL ER 225 MG PO TB24: 225.0000 mg | ORAL_TABLET | Freq: Every day | ORAL | Status: DC

## 2014-04-27 MED ORDER — MONTELUKAST SODIUM 10 MG PO TABS: 10.0000 mg | ORAL_TABLET | Freq: Every day | ORAL | Status: DC

## 2014-04-27 MED ORDER — VENLAFAXINE HCL ER 225 MG PO TB24: 225.0000 mg | ORAL_TABLET | Freq: Every evening | ORAL | Status: DC

## 2014-04-27 NOTE — Progress Notes (Signed)
Pre visit review using our clinic review tool, if applicable. No additional management support is needed unless otherwise documented below in the visit note. 

## 2014-04-27 NOTE — Assessment & Plan Note (Signed)
Avoid offending foods, start probiotics. Do not eat large meals in late evening and consider raising head of bed.  

## 2014-04-27 NOTE — Patient Instructions (Signed)

## 2014-04-27 NOTE — Assessment & Plan Note (Signed)
Start Zrytec and Flonase daily and report if no improvement

## 2014-04-27 NOTE — Assessment & Plan Note (Signed)
Following with hematology stable, asymptomatic

## 2014-04-27 NOTE — Progress Notes (Signed)
Patient ID: Rachael Johnson, female   DOB: [birth year], 63 y.o.   MRN: [id number] DYONNA JASPERS [id number] 04-03-1951 04/27/2014      Progress Note-Follow Up  Subjective  Chief Complaint  Chief Complaint  Patient presents with  . Follow-up    HPI  Patient is a 63 year old female in today for routine medical care. She is to followup in has struggling since her brother died a couple weeks ago. They were very close no suicidal ideation but is is crying easily and struggling with anhedonia. No recent illness, malaise and myalgias are noted. Denies CP/palp/SOB/HA/congestion/fevers/GI or GU c/o. Taking meds as prescribed  Past Medical History  Diagnosis Date  . Arthritis   . Allergy   . Thyroid disease   . Diverticulitis   . Frequent episodic tension-type headache   . Hypertension   . Seizure     childhood  . Anemia, iron deficiency 07/08/2012  . Depression with anxiety 03/24/2011  . Depression   . GERD (gastroesophageal reflux disease)   . Essential thrombocythemia 01/24/2011  . H. pylori infection 12/19/2012  . Other and unspecified hyperlipidemia 02/25/2013  . Preventative health care 04/20/2013  . Cerumen impaction 04/28/2013  . 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 Esophageal reflux 08/17/2013    Past Surgical History  Procedure Laterality Date  . Breast surgery  1992    biopsy, benign. Fibrocystic  . Abdominal hysterectomy      1992  . Umbilical hernia repair N/A 09/25/2012    Procedure: HERNIA REPAIR UMBILICAL ADULT;  Surgeon: Harl Bowie, MD;  Location: [facility];  Service: General;  Laterality: N/A;    Family History  Problem Relation Age of Onset  . Arthritis Mother   . Cancer Mother     ovarian  . Hypertension Mother   . Heart disease Mother     pacer  . Heart failure Mother   . Arthritis Father   . Cancer Father     lung  . Hypertension Father   . Cancer Sister     lung  . Cancer Brother     prostate  . Cancer Brother     lung  . Hypertension Son   . COPD  Brother   . Heart disease Brother   . Hypertension Brother   . Cancer Brother     colon  . Alcohol abuse Brother   . Cirrhosis Brother   . Seizures Sister   . Stroke Sister   . Arthritis Brother     History   Social History  . Marital Status: Widowed    Spouse Name: N/A    Number of Children: 2  . Years of Education: N/A   Occupational History  . APL operator     [employer]   Social History Main Topics  . Smoking status: Former Smoker -- 3.50 packs/day for 20 years    Types: Cigarettes    Start date: 11/15/1970    Quit date: 07/31/1990  . Smokeless tobacco: Never Used     Comment: quit 23 years ago  . Alcohol Use: No  . Drug Use: No  . Sexual Activity: No   Other Topics Concern  . Not on file   Social History Narrative   Regular exercise: no   Caffeine Use: 2-3 weekly    Current Outpatient Prescriptions on File Prior to Visit  Medication Sig Dispense Refill  . acetaminophen-codeine (TYLENOL #3) 300-30 MG per tablet TAKE 1 TABLET BY MOUTH EVERY 6 HOURS AS NEEDED  FOR MODERATE TO SEVERE PAIN  90 tablet  2  . ALPRAZolam (XANAX) 1 MG tablet Take 1 tablet (1 mg total) by mouth 3 (three) times daily as needed for sleep or anxiety.  90 tablet  3  . aspirin EC 81 MG tablet Take 81 mg by mouth every morning.      . Calcium Carbonate-Vitamin D (CALTRATE COLON HEALTH PO) Take by mouth.      . estradiol (ESTRACE) 0.5 MG tablet TAKE ONE TABLET BY MOUTH TWICE DAILY  180 tablet  0  . fluorometholone (FML) 0.1 % ophthalmic suspension Place 1 drop into both eyes daily as needed (For dry eyes.).       Marland Kitchen folic acid (FOLVITE) 1 MG tablet Take 1 mg by mouth daily.      . furosemide (LASIX) 20 MG tablet TAKE  ONE TABLET BY MOUTH EVERY MORNING  90 tablet  1  . HYDROcodone-homatropine (HYCODAN) 5-1.5 MG/5ML syrup Take 5 mLs by mouth every 8 (eight) hours as needed for cough.  240 mL  0  . hydroxyurea (HYDREA) 500 MG capsule Take 1 capsule (500 mg total) by mouth daily after breakfast.   90 capsule  4  . levofloxacin (LEVAQUIN) 500 MG tablet Take 1 tablet (500 mg total) by mouth daily.  10 tablet  0  . levothyroxine (SYNTHROID, LEVOTHROID) 88 MCG tablet TAKE ONE TABLET BY MOUTH ONE  TIME DAILY BEFORE BREAKFAST  30 tablet  3  . loratadine (CLARITIN) 10 MG tablet TAKE 1 TABLET BY MOUTH ONCE A DAY AS NEEDED FOR ALLERGIES OR ITCHING  30 tablet  10  . Multiple Vitamin (MULTIVITAMIN PO) Take 1 tablet by mouth every morning.       . naproxen (NAPROSYN) 500 MG tablet TAKE ONE TABLET BY MOUTH TWICE A DAY WITH MEALS  180 tablet  1  . Omega-3 Fatty Acids (FISH OIL) 1000 MG CAPS Take by mouth 2 (two) times daily.      Marland Kitchen omeprazole (PRILOSEC) 40 MG capsule TAKE 1 CAPSULE (40 MG TOTAL)  BY MOUTH 2 (TWO) TIMES DAILY.  60 capsule  2  . ondansetron (ZOFRAN) 8 MG tablet Take 1 tablet (8 mg total) by mouth every 8 (eight) hours as needed for nausea or vomiting.  20 tablet  2  . Probiotic Product (PROBIOTIC DAILY PO) Take by mouth 2 (two) times daily.      . ranitidine (ZANTAC) 300 MG tablet TAKE 1 TABLET BY MOUTH AT BEDTIME AS NEEDED FOR HEARTBURN  30 tablet  3  . Vitamin D, Ergocalciferol, (DRISDOL) 50000 UNITS CAPS capsule TAKE 1 CAPSULE BY MOUTH EVERY OTHER WEEK AS INSTRUCTED  6 capsule  2   No current facility-administered medications on file prior to visit.    No Known Allergies  Review of Systems  Review of Systems  Constitutional: Positive for malaise/fatigue. Negative for fever.  HENT: Negative for congestion.   Eyes: Negative for discharge.  Respiratory: Negative for shortness of breath.   Cardiovascular: Negative for chest pain, palpitations and leg swelling.  Gastrointestinal: Negative for nausea, abdominal pain and diarrhea.  Genitourinary: Negative for dysuria.  Musculoskeletal: Negative for falls.  Skin: Negative for rash.  Neurological: Negative for loss of consciousness and headaches.  Endo/Heme/Allergies: Negative for polydipsia.  Psychiatric/Behavioral: Positive for  depression. Negative for suicidal ideas. The patient is nervous/anxious. The patient does not have insomnia.     Objective  BP 123/63  Pulse 81  Temp(Src) 98.2 F (36.8 C) (Oral)  Ht 5\' 3"  (  1.6 m)  Wt 117 lb (53.071 kg)  BMI 20.73 kg/m2  SpO2 97%  LMP 07/31/1990  Physical Exam  63  Lab Results  Component Value Date   TSH 2.588 04/18/2013   Lab Results  Component Value Date   WBC 10.1 04/13/2014   HGB 10.4* 04/13/2014   HCT 32.2* 04/13/2014   MCV 115.9* 04/13/2014   PLT 453* 03/20/2014   Lab Results  Component Value Date   CREATININE 0.65 03/20/2014   BUN 15 03/20/2014   NA 137 03/20/2014   K 5.0 03/20/2014   CL 103 03/20/2014   CO2 31 03/20/2014   Lab Results  Component Value Date   ALT 8 03/20/2014   AST 18 03/20/2014   ALKPHOS 52 03/20/2014   BILITOT 0.2 03/20/2014   Lab Results  Component Value Date   CHOL 169 04/18/2013   Lab Results  Component Value Date   HDL 53 04/18/2013   Lab Results  Component Value Date   LDLCALC 98 04/18/2013   Lab Results  Component Value Date   TRIG 92 04/18/2013   Lab Results  Component Value Date   CHOLHDL 3.2 04/18/2013     Assessment & Plan  Esophageal reflux Avoid offending foods, start probiotics. Do not eat large meals in late evening and consider raising head of bed.   Depression with anxiety Her brother has died in the past few weeks and she is struggling. Will try dropping the citalopram dose and increasing her Venlafaxine dose. She will call with any concerns  Essential thrombocythemia Following with hematology stable, asymptomatic  Allergic state Start Zrytec and Flonase daily and report if no improvement

## 2014-04-27 NOTE — Assessment & Plan Note (Signed)
Her brother has died in the past few weeks and she is struggling. Will try dropping the citalopram dose and increasing her Venlafaxine dose. She will call with any concerns

## 2014-05-01 ENCOUNTER — Telehealth: Payer: Self-pay | Admitting: Family Medicine

## 2014-05-01 NOTE — Telephone Encounter (Signed)
Please advise 

## 2014-05-01 NOTE — Telephone Encounter (Signed)
Caller name: Lian Relation to pt: self  Call back number: [phone] [facility] [phone]   Reason for call:   pt would like to know can she go back to the lower dosage of  Venlafaxine because the Venlafaxine HCl 225 MG TB24  requires a 50 co-pay. Pt stated she went ahead and paid this time but for the future she would like the lower dosage do to the cost.

## 2014-05-01 NOTE — Telephone Encounter (Signed)
Would she like to try Venlafaxine XR 75 mg tabs 1 tab po tid to see if the copay is better, then she can take all 3 at once.

## 2014-05-04 NOTE — Telephone Encounter (Signed)
Left a detailed message and asked pt to return our call and let us know if she would like to try the 75 mg tid or something else?

## 2014-05-06 MED ORDER — VENLAFAXINE HCL ER 150 MG PO CP24: 150.0000 mg | ORAL_CAPSULE | Freq: Every day | ORAL | Status: DC

## 2014-05-06 MED ORDER — VENLAFAXINE HCL ER 75 MG PO CP24: 75.0000 mg | ORAL_CAPSULE | Freq: Every day | ORAL | Status: DC

## 2014-05-06 NOTE — Telephone Encounter (Signed)
Pt called back.  Advised that she would be called back as soon as you got a chance.

## 2014-05-06 NOTE — Telephone Encounter (Signed)
FYI: Spoke with patient and informed her of provider suggestion, also that from prior experience known that Capsule form of medication is usually about $100 mthly Cheaper than the Tablet form; patient agreed to try the 150 mg & 75 mg capsule form together daily and will let us know if there is any issue with the cost, Rx[s] sent to pharmacy/SLS

## 2014-05-06 NOTE — Telephone Encounter (Signed)
Pt returned your call.  

## 2014-05-08 ENCOUNTER — Other Ambulatory Visit: Payer: Self-pay

## 2014-05-08 MED ORDER — VENLAFAXINE HCL ER 150 MG PO CP24: 150.0000 mg | ORAL_CAPSULE | Freq: Every day | ORAL | Status: DC

## 2014-05-08 MED ORDER — VENLAFAXINE HCL ER 75 MG PO CP24: 75.0000 mg | ORAL_CAPSULE | Freq: Every day | ORAL | Status: DC

## 2014-05-12 ENCOUNTER — Other Ambulatory Visit: Payer: Self-pay | Admitting: Family Medicine

## 2014-05-12 NOTE — Telephone Encounter (Signed)
Please advise 

## 2014-05-12 NOTE — Telephone Encounter (Signed)
rx faxed

## 2014-05-28 ENCOUNTER — Other Ambulatory Visit: Payer: Self-pay | Admitting: Family Medicine

## 2014-05-28 NOTE — Telephone Encounter (Signed)
rx printed for md to sign and fax  Last RX was wrote on 11-24-13 quantity 90 with 3 refills

## 2014-06-01 ENCOUNTER — Encounter: Payer: Self-pay | Admitting: Family Medicine

## 2014-06-12 ENCOUNTER — Ambulatory Visit (HOSPITAL_BASED_OUTPATIENT_CLINIC_OR_DEPARTMENT_OTHER): Payer: BC Managed Care – PPO | Admitting: Hematology & Oncology

## 2014-06-12 ENCOUNTER — Other Ambulatory Visit (HOSPITAL_BASED_OUTPATIENT_CLINIC_OR_DEPARTMENT_OTHER): Payer: BC Managed Care – PPO | Admitting: Lab

## 2014-06-12 VITALS — BP 114/70 | HR 75 | Temp 98.0°F | Resp 14 | Ht 63.0 in | Wt 121.0 lb

## 2014-06-12 DIAGNOSIS — E033 Postinfectious hypothyroidism: Secondary | ICD-10-CM

## 2014-06-12 DIAGNOSIS — R031 Nonspecific low blood-pressure reading: Secondary | ICD-10-CM

## 2014-06-12 DIAGNOSIS — D473 Essential (hemorrhagic) thrombocythemia: Secondary | ICD-10-CM

## 2014-06-12 LAB — CBC WITH DIFFERENTIAL (CANCER CENTER ONLY)
BASO#: 0.1 10*3/uL (ref 0.0–0.2)
BASO%: 1.3 % (ref 0.0–2.0)
EOS%: 5.5 % (ref 0.0–7.0)
Eosinophils Absolute: 0.2 10*3/uL (ref 0.0–0.5)
HCT: 36 % (ref 34.8–46.6)
HGB: 12 g/dL (ref 11.6–15.9)
LYMPH#: 1.7 10*3/uL (ref 0.9–3.3)
LYMPH%: 42.1 % (ref 14.0–48.0)
MCH: 38.8 pg — ABNORMAL HIGH (ref 26.0–34.0)
MCHC: 33.3 g/dL (ref 32.0–36.0)
MCV: 117 fL — ABNORMAL HIGH (ref 81–101)
MONO#: 0.3 10*3/uL (ref 0.1–0.9)
MONO%: 8.5 % (ref 0.0–13.0)
NEUT#: 1.7 10*3/uL (ref 1.5–6.5)
NEUT%: 42.6 % (ref 39.6–80.0)
Platelets: 555 10*3/uL — ABNORMAL HIGH (ref 145–400)
RBC: 3.09 10*6/uL — ABNORMAL LOW (ref 3.70–5.32)
RDW: 12.5 % (ref 11.1–15.7)
WBC: 4 10*3/uL (ref 3.9–10.0)

## 2014-06-12 LAB — CMP (CANCER CENTER ONLY)
ALT(SGPT): 14 U/L (ref 10–47)
AST: 22 U/L (ref 11–38)
Albumin: 3.7 g/dL (ref 3.3–5.5)
Alkaline Phosphatase: 46 U/L (ref 26–84)
BUN, Bld: 15 mg/dL (ref 7–22)
CO2: 29 mEq/L (ref 18–33)
Calcium: 8.8 mg/dL (ref 8.0–10.3)
Chloride: 97 mEq/L — ABNORMAL LOW (ref 98–108)
Creat: 0.6 mg/dl (ref 0.6–1.2)
Glucose, Bld: 85 mg/dL (ref 73–118)
Potassium: 3.7 mEq/L (ref 3.3–4.7)
Sodium: 137 mEq/L (ref 128–145)
Total Bilirubin: 0.4 mg/dl (ref 0.20–1.60)
Total Protein: 6.7 g/dL (ref 6.4–8.1)

## 2014-06-12 LAB — IRON AND TIBC CHCC
%SAT: 30 % (ref 21–57)
Iron: 64 ug/dL (ref 41–142)
TIBC: 211 ug/dL — ABNORMAL LOW (ref 236–444)
UIBC: 148 ug/dL (ref 120–384)

## 2014-06-12 LAB — CHCC SATELLITE - SMEAR

## 2014-06-12 LAB — FERRITIN CHCC: Ferritin: 227 ng/ml (ref 9–269)

## 2014-06-14 NOTE — Progress Notes (Signed)
Hematology and Oncology Follow Up Visit  Rachael Johnson [id number] 06/11/1951 63 y.o. 06/14/2014   Principle Diagnosis:  Essential thrombocythemia - JAK2 negative. 2. Intermittent iron-deficiency anemia.  Current Therapy:   Hydrea 500 mg by mouth daily alternating with 1000mg  po q day.      Interim History:  [name] is back for followup. She actually looks much better. She feels a little better. She recently had a viral infection. She was in bed most of the day. However, she is feeling better.  She has thought about retiring. However, she thought that she probably would do better at work.  Her appetite is better. She's not had any nausea or vomiting. She's had no abdominal pain. There is no cough. There has been no change in bowel or bladder habits. Her last iron studies back in June showed a ferritin of 236 with an iron saturation 56 %.  Medications: Current outpatient prescriptions: acetaminophen-codeine (TYLENOL #3) 300-30 MG per tablet, TAKE 1 TABLET BY MOUTH EVERY 6 HOURS AS NEEDED FOR MODERATE TO SEVERE PAIN, Disp: 90 tablet, Rfl: 1;  ALPRAZolam (XANAX) 1 MG tablet, TAKE ONE TABLET BY MOUTH THREE TIMES A DAY AS NEEDED FOR ANXIETY, Disp: 90 tablet, Rfl: 2;  aspirin EC 81 MG tablet, Take 81 mg by mouth every morning., Disp: , Rfl:  Calcium Carbonate-Vitamin D (CALTRATE COLON HEALTH PO), Take by mouth., Disp: , Rfl: ;  citalopram (CELEXA) 40 MG tablet, Take 1 tablet (40 mg total) by mouth every morning., Disp: 30 tablet, Rfl: 3;  Cranberry 300 MG tablet, Take 300 mg by mouth 2 (two) times daily., Disp: , Rfl: ;  estradiol (ESTRACE) 0.5 MG tablet, TAKE ONE TABLET BY MOUTH TWICE DAILY, Disp: 180 tablet, Rfl: 0 fluorometholone (FML) 0.1 % ophthalmic suspension, Place 1 drop into both eyes daily as needed (For dry eyes.). , Disp: , Rfl: ;  folic acid (FOLVITE) 1 MG tablet, Take 1 mg by mouth daily., Disp: , Rfl: ;  furosemide (LASIX) 20 MG tablet, TAKE  ONE TABLET BY MOUTH EVERY MORNING,  Disp: 90 tablet, Rfl: 1;  hydroxyurea (HYDREA) 500 MG capsule, Take 1 capsule (500 mg total) by mouth daily after breakfast., Disp: 90 capsule, Rfl: 4 levothyroxine (SYNTHROID, LEVOTHROID) 88 MCG tablet, TAKE ONE TABLET BY MOUTH ONE  TIME DAILY BEFORE BREAKFAST, Disp: 30 tablet, Rfl: 3;  loratadine (CLARITIN) 10 MG tablet, TAKE 1 TABLET BY MOUTH ONCE A DAY AS NEEDED FOR ALLERGIES OR ITCHING, Disp: 30 tablet, Rfl: 10;  montelukast (SINGULAIR) 10 MG tablet, Take 1 tablet (10 mg total) by mouth at bedtime., Disp: 30 tablet, Rfl: 3 Multiple Vitamin (MULTIVITAMIN PO), Take 1 tablet by mouth every morning. , Disp: , Rfl: ;  naproxen (NAPROSYN) 500 MG tablet, TAKE ONE TABLET BY MOUTH TWICE A DAY WITH MEALS, Disp: 180 tablet, Rfl: 1;  Omega-3 Fatty Acids (FISH OIL) 1000 MG CAPS, Take by mouth 2 (two) times daily., Disp: , Rfl: ;  omeprazole (PRILOSEC) 40 MG capsule, TAKE 1 CAPSULE (40 MG TOTAL)  BY MOUTH 2 (TWO) TIMES DAILY., Disp: 60 capsule, Rfl: 2 ondansetron (ZOFRAN) 8 MG tablet, Take 1 tablet (8 mg total) by mouth every 8 (eight) hours as needed for nausea or vomiting., Disp: 20 tablet, Rfl: 2;  Probiotic Product (PROBIOTIC DAILY PO), Take by mouth 2 (two) times daily., Disp: , Rfl: ;  ranitidine (ZANTAC) 300 MG tablet, TAKE 1 TABLET BY MOUTH AT BEDTIME AS NEEDED FOR HEARTBURN, Disp: 30 tablet, Rfl: 3 venlafaxine XR (  EFFEXOR-XR) 150 MG 24 hr capsule, Take 1 capsule (150 mg total) by mouth daily with breakfast., Disp: 90 capsule, Rfl: 1;  venlafaxine XR (EFFEXOR-XR) 75 MG 24 hr capsule, Take 1 capsule (75 mg total) by mouth daily with breakfast., Disp: 90 capsule, Rfl: 1;  Vitamin D, Ergocalciferol, (DRISDOL) 50000 UNITS CAPS capsule, TAKE 1 CAPSULE BY MOUTH EVERY OTHER WEEK AS INSTRUCTED, Disp: 6 capsule, Rfl: 2  Allergies: No Known Allergies  Past Medical History, Surgical history, Social history, and Family History were reviewed and updated.  Review of Systems: As above  Physical Exam:  height is 5\' 3"   (1.6 m) and weight is 121 lb (54.885 kg). Her oral temperature is 98 F (36.7 C). Her blood pressure is 114/70 and her pulse is 75. Her respiration is 14.   Thin white female. Lungs are clear. Cardiac exam regular rate and rhythm. No murmurs. Abdomen is soft. She has good bowel sounds. There is no fluid wave. There is no palpable liver or spleen. Back exam no tenderness over the spine. Extremities shows mild chronic nonpitting edema . Skin exam no rashes. Neurological exam no focal deficits.  Lab Results  Component Value Date   WBC 4.0 06/12/2014   HGB 12.0 06/12/2014   HCT 36.0 06/12/2014   MCV 117* 06/12/2014   PLT 555* 06/12/2014     Chemistry      Component Value Date/Time   NA 137 06/12/2014 0812   NA 137 03/20/2014 0937   K 3.7 06/12/2014 0812   K 5.0 03/20/2014 0937   CL 97* 06/12/2014 0812   CL 103 03/20/2014 0937   CO2 29 06/12/2014 0812   CO2 31 03/20/2014 0937   BUN 15 06/12/2014 0812   BUN 15 03/20/2014 0937   CREATININE 0.6 06/12/2014 0812   CREATININE 0.65 03/20/2014 0937      Component Value Date/Time   CALCIUM 8.8 06/12/2014 0812   CALCIUM 8.8 03/20/2014 0937   ALKPHOS 46 06/12/2014 0812   ALKPHOS 52 03/20/2014 0937   AST 22 06/12/2014 0812   AST 18 03/20/2014 0937   ALT 14 06/12/2014 0812   ALT 8 03/20/2014 0937   BILITOT 0.40 06/12/2014 0812   BILITOT 0.2 03/20/2014 0937         Impression and Plan: Rachael Johnson is 63 year old white female with essential thrombocythemia. Her platelet count is elevated. As such, we will have to go up on the Hydrea. I will go ahead and get her on 500 mg daily alternating with 1000 mg a day.   I'm just happy that she is gaining weight. I think that her blood counts to be better.  We will see her iron studies show.  Her last prealbumin was holding steady.  We will plan to see her back in another 2-3 months.  [name], MD 11/15/20152:14 PM

## 2014-06-15 ENCOUNTER — Telehealth: Payer: Self-pay | Admitting: *Deleted

## 2014-06-15 NOTE — Telephone Encounter (Addendum)
Message left on voice mail.  ----- Message from [name], MD sent at 06/14/2014  2:24 PM EST ----- Please call and let her know that the iron level is okay. Laurey Arrow

## 2014-06-18 ENCOUNTER — Other Ambulatory Visit: Payer: Self-pay | Admitting: Hematology & Oncology

## 2014-06-22 ENCOUNTER — Other Ambulatory Visit: Payer: Self-pay | Admitting: Family Medicine

## 2014-06-22 NOTE — Telephone Encounter (Signed)
Medication Detail      Disp Refills Start End     acetaminophen-codeine (TYLENOL #3) 300-30 MG per tablet 90 tablet 1 05/12/2014     Sig: TAKE 1 TABLET BY MOUTH EVERY 6 HOURS AS NEEDED FOR MODERATE TO SEVERE PAIN    Class: Print     Pharmacy    [id number] - [place], [place] - [address]   Follow-up and Disposition  04/27/14 [facility]   Return in about 2 months (around 06/27/2014).   Patient has appointment scheduled for 12.07.15; Please Advise on refills/SLS

## 2014-06-26 ENCOUNTER — Other Ambulatory Visit: Payer: Self-pay | Admitting: Family Medicine

## 2014-07-02 ENCOUNTER — Ambulatory Visit: Payer: BC Managed Care – PPO | Admitting: Family Medicine

## 2014-07-06 ENCOUNTER — Ambulatory Visit (INDEPENDENT_AMBULATORY_CARE_PROVIDER_SITE_OTHER): Payer: BC Managed Care – PPO | Admitting: Family Medicine

## 2014-07-06 ENCOUNTER — Encounter: Payer: Self-pay | Admitting: Family Medicine

## 2014-07-06 VITALS — BP 121/76 | HR 79 | Temp 98.0°F | Ht 63.0 in | Wt 117.4 lb

## 2014-07-06 DIAGNOSIS — J209 Acute bronchitis, unspecified: Secondary | ICD-10-CM

## 2014-07-06 DIAGNOSIS — K219 Gastro-esophageal reflux disease without esophagitis: Secondary | ICD-10-CM

## 2014-07-06 DIAGNOSIS — Z23 Encounter for immunization: Secondary | ICD-10-CM

## 2014-07-06 DIAGNOSIS — D649 Anemia, unspecified: Secondary | ICD-10-CM

## 2014-07-06 DIAGNOSIS — E785 Hyperlipidemia, unspecified: Secondary | ICD-10-CM

## 2014-07-06 DIAGNOSIS — K5900 Constipation, unspecified: Secondary | ICD-10-CM

## 2014-07-06 DIAGNOSIS — R300 Dysuria: Secondary | ICD-10-CM

## 2014-07-06 DIAGNOSIS — I889 Nonspecific lymphadenitis, unspecified: Secondary | ICD-10-CM

## 2014-07-06 DIAGNOSIS — J029 Acute pharyngitis, unspecified: Secondary | ICD-10-CM

## 2014-07-06 DIAGNOSIS — E039 Hypothyroidism, unspecified: Secondary | ICD-10-CM

## 2014-07-06 LAB — CBC
HCT: 37.4 % (ref 36.0–46.0)
Hemoglobin: 12.3 g/dL (ref 12.0–15.0)
MCHC: 32.8 g/dL (ref 30.0–36.0)
MCV: 116.8 fl — ABNORMAL HIGH (ref 78.0–100.0)
Platelets: 459 10*3/uL — ABNORMAL HIGH (ref 150.0–400.0)
RBC: 3.2 Mil/uL — ABNORMAL LOW (ref 3.87–5.11)
RDW: 13.9 % (ref 11.5–15.5)
WBC: 5 10*3/uL (ref 4.0–10.5)

## 2014-07-06 LAB — RENAL FUNCTION PANEL
Albumin: 4.3 g/dL (ref 3.5–5.2)
BUN: 18 mg/dL (ref 6–23)
CO2: 27 mEq/L (ref 19–32)
Calcium: 9 mg/dL (ref 8.4–10.5)
Chloride: 103 mEq/L (ref 96–112)
Creatinine, Ser: 0.6 mg/dL (ref 0.4–1.2)
GFR: 103.52 mL/min (ref >60.00)
Glucose, Bld: 57 mg/dL — ABNORMAL LOW (ref 70–99)
Phosphorus: 3.4 mg/dL (ref 2.3–4.6)
Potassium: 3.7 mEq/L (ref 3.5–5.1)
Sodium: 138 mEq/L (ref 135–145)

## 2014-07-06 LAB — HEPATIC FUNCTION PANEL
ALT: 14 U/L (ref 0–35)
AST: 26 U/L (ref 0–37)
Albumin: 4.3 g/dL (ref 3.5–5.2)
Alkaline Phosphatase: 60 U/L (ref 39–117)
Bilirubin, Direct: 0.1 mg/dL (ref 0.0–0.3)
Total Bilirubin: 0.5 mg/dL (ref 0.2–1.2)
Total Protein: 7.1 g/dL (ref 6.0–8.3)

## 2014-07-06 LAB — URINALYSIS
Bilirubin Urine: NEGATIVE
Hgb urine dipstick: NEGATIVE
Ketones, ur: NEGATIVE
Leukocytes, UA: NEGATIVE
Nitrite: NEGATIVE
Specific Gravity, Urine: 1.02 (ref 1.000–1.030)
Total Protein, Urine: NEGATIVE
Urine Glucose: NEGATIVE
Urobilinogen, UA: 0.2 (ref 0.0–1.0)
pH: 5.5 (ref 5.0–8.0)

## 2014-07-06 LAB — LIPID PANEL
Cholesterol: 178 mg/dL (ref 0–200)
HDL: 77.9 mg/dL (ref >39.00)
LDL Cholesterol: 81 mg/dL (ref 0–99)
NonHDL: 100.1
Total CHOL/HDL Ratio: 2
Triglycerides: 96 mg/dL (ref 0.0–149.0)
VLDL: 19.2 mg/dL (ref 0.0–40.0)

## 2014-07-06 LAB — TSH: TSH: 2.12 u[IU]/mL (ref 0.35–4.50)

## 2014-07-06 MED ORDER — CEFDINIR 300 MG PO CAPS: 300.0000 mg | ORAL_CAPSULE | Freq: Two times a day (BID) | ORAL | Status: AC

## 2014-07-06 MED ORDER — CEFTRIAXONE SODIUM 1 G IJ SOLR
250.0000 mg | INTRAMUSCULAR | Status: DC
  Administered 2014-07-06: 250 mg via INTRAMUSCULAR

## 2014-07-06 MED ORDER — BENZONATATE 100 MG PO CAPS: 100.0000 mg | ORAL_CAPSULE | Freq: Three times a day (TID) | ORAL | Status: DC | PRN

## 2014-07-06 MED ORDER — ALBUTEROL SULFATE HFA 108 (90 BASE) MCG/ACT IN AERS
2.0000 | INHALATION_SPRAY | Freq: Four times a day (QID) | RESPIRATORY_TRACT | Status: DC | PRN
Start: 2014-07-06 — End: 2014-12-21

## 2014-07-06 NOTE — Progress Notes (Signed)
Rachael Johnson  [id number] 1950/09/05 07/06/2014      Progress Note-Follow Up  Subjective  Chief Complaint  Chief Complaint  Patient presents with  . Follow-up    2 month  . Sore Throat    X 4 days    HPI  Patient is a 63 y.o. female in today for routine medical care. Patient is in today for follow-up. Has been struggling with a sore throat for 4 days. No dysphagia but significant discomfort. Some mild anorexia. No fevers or chills.  No nasal congestion. Denies CP/palp/SOB/HA/congestion/fevers/GI or GU c/o. Taking meds as prescribed  Past Medical History  Diagnosis Date  . Arthritis   . Allergy   . Thyroid disease   . Diverticulitis   . Frequent episodic tension-type headache   . Hypertension   . Seizure     childhood  . Anemia, iron deficiency 07/08/2012  . Depression with anxiety 03/24/2011  . Depression   . GERD (gastroesophageal reflux disease)   . Essential thrombocythemia 01/24/2011  . H. pylori infection 12/19/2012  . Other and unspecified hyperlipidemia 02/25/2013  . Preventative health care 04/20/2013  . Cerumen impaction 04/28/2013  . 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 Esophageal reflux 08/17/2013    Past Surgical History  Procedure Laterality Date  . Breast surgery  1992    biopsy, benign. Fibrocystic  . Abdominal hysterectomy      1992  . Umbilical hernia repair N/A 09/25/2012    Procedure: HERNIA REPAIR UMBILICAL ADULT;  Surgeon: Harl Bowie, MD;  Location: [facility];  Service: General;  Laterality: N/A;    Family History  Problem Relation Age of Onset  . Arthritis Mother   . Cancer Mother     ovarian  . Hypertension Mother   . Heart disease Mother     pacer  . Heart failure Mother   . Arthritis Father   . Cancer Father     lung  . Hypertension Father   . Cancer Sister     lung  . Cancer Brother     prostate  . Cancer Brother     lung  . Hypertension Son   . COPD Brother   . Heart disease Brother   . Hypertension Brother   . Cancer  Brother     colon  . Alcohol abuse Brother   . Cirrhosis Brother   . Seizures Sister   . Stroke Sister   . Arthritis Brother     History   Social History  . Marital Status: Widowed    Spouse Name: N/A    Number of Children: 2  . Years of Education: N/A   Occupational History  . APL operator     [employer]   Social History Main Topics  . Smoking status: Former Smoker -- 3.50 packs/day for 20 years    Types: Cigarettes    Start date: 11/15/1970    Quit date: 07/31/1990  . Smokeless tobacco: Never Used     Comment: quit 23 years ago  . Alcohol Use: No  . Drug Use: No  . Sexual Activity: No   Other Topics Concern  . Not on file   Social History Narrative   Regular exercise: no   Caffeine Use: 2-3 weekly    Current Outpatient Prescriptions on File Prior to Visit  Medication Sig Dispense Refill  . acetaminophen-codeine (TYLENOL #3) 300-30 MG per tablet TAKE 1 TABLET BY MOUTH EVERY 6 HOURS AS NEEDED FOR MODERATE TO SEVERE PAIN 90 tablet  0  . ALPRAZolam (XANAX) 1 MG tablet TAKE ONE TABLET BY MOUTH THREE TIMES A DAY AS NEEDED FOR ANXIETY 90 tablet 2  . aspirin EC 81 MG tablet Take 81 mg by mouth every morning.    . Calcium Carbonate-Vitamin D (CALTRATE COLON HEALTH PO) Take by mouth.    . citalopram (CELEXA) 40 MG tablet Take 1 tablet (40 mg total) by mouth every morning. 30 tablet 3  . Cranberry 300 MG tablet Take 300 mg by mouth 2 (two) times daily.    Marland Kitchen estradiol (ESTRACE) 0.5 MG tablet TAKE ONE TABLET BY MOUTH TWICE DAILY 180 tablet 0  . fluorometholone (FML) 0.1 % ophthalmic suspension Place 1 drop into both eyes daily as needed (For dry eyes.).     Marland Kitchen folic acid (FOLVITE) 1 MG tablet Take 1 mg by mouth daily.    . furosemide (LASIX) 20 MG tablet TAKE  ONE TABLET BY MOUTH EVERY MORNING 90 tablet 1  . hydroxyurea (HYDREA) 500 MG capsule Take 1 capsule (500 mg total) by mouth daily after breakfast. 90 capsule 4  . levothyroxine (SYNTHROID, LEVOTHROID) 88 MCG tablet TAKE  ONE TABLET BY MOUTH ONE  TIME DAILY BEFORE BREAKFAST 30 tablet 3  . loratadine (CLARITIN) 10 MG tablet TAKE 1 TABLET BY MOUTH ONCE A DAY AS NEEDED FOR ALLERGIES OR ITCHING 30 tablet 10  . montelukast (SINGULAIR) 10 MG tablet Take 1 tablet (10 mg total) by mouth at bedtime. 30 tablet 3  . Multiple Vitamin (MULTIVITAMIN PO) Take 1 tablet by mouth every morning.     . naproxen (NAPROSYN) 500 MG tablet TAKE ONE TABLET BY MOUTH TWICE A DAY WITH MEALS 180 tablet 1  . Omega-3 Fatty Acids (FISH OIL) 1000 MG CAPS Take by mouth 2 (two) times daily.    Marland Kitchen omeprazole (PRILOSEC) 40 MG capsule TAKE 1 CAPSULE (40 MG TOTAL)   BY MOUTH 2 (TWO) TIMES DAILY. 60 capsule 1  . ondansetron (ZOFRAN) 8 MG tablet Take 1 tablet (8 mg total) by mouth every 8 (eight) hours as needed for nausea or vomiting. 20 tablet 2  . Probiotic Product (PROBIOTIC DAILY PO) Take by mouth 2 (two) times daily.    . ranitidine (ZANTAC) 300 MG tablet TAKE 1 TABLET BY MOUTH AT BEDTIME AS NEEDED FOR HEARTBURN 30 tablet 3  . venlafaxine XR (EFFEXOR-XR) 150 MG 24 hr capsule Take 1 capsule (150 mg total) by mouth daily with breakfast. 90 capsule 1  . venlafaxine XR (EFFEXOR-XR) 75 MG 24 hr capsule Take 1 capsule (75 mg total) by mouth daily with breakfast. 90 capsule 1  . Vitamin D, Ergocalciferol, (DRISDOL) 50000 UNITS CAPS capsule TAKE 1 CAPSULE BY MOUTH EVERY OTHER WEEK AS INSTRUCTED 6 capsule 1   No current facility-administered medications on file prior to visit.    No Known Allergies  Review of Systems  Review of Systems  Constitutional: Positive for malaise/fatigue. Negative for fever.  HENT: Positive for congestion and ear pain.   Eyes: Negative for discharge.  Respiratory: Positive for cough, sputum production, shortness of breath and wheezing.   Cardiovascular: Positive for chest pain. Negative for palpitations and leg swelling.  Gastrointestinal: Negative for nausea, abdominal pain and diarrhea.  Genitourinary: Negative for  dysuria.  Musculoskeletal: Positive for myalgias and joint pain. Negative for falls.  Skin: Negative for rash.  Neurological: Positive for weakness and headaches. Negative for loss of consciousness.  Endo/Heme/Allergies: Negative for polydipsia.  Psychiatric/Behavioral: Negative for depression and suicidal ideas. The patient is not  nervous/anxious and does not have insomnia.     Objective  BP 121/76 mmHg  Pulse 79  Temp(Src) 98 F (36.7 C) (Oral)  Ht 5\' 3"  (1.6 m)  Wt 117 lb 6.4 oz (53.252 kg)  BMI 20.80 kg/m2  SpO2 96%  LMP 07/31/1990  Physical Exam  Physical Exam  Constitutional: She is oriented to person, place, and time and well-developed, well-nourished, and in no distress. No distress.  HENT:  Head: Normocephalic and atraumatic.  Eyes: Conjunctivae are normal.  Neck: Neck supple. No thyromegaly present.  Cardiovascular: Normal rate, regular rhythm and normal heart sounds.   No murmur heard. Pulmonary/Chest: Effort normal and breath sounds normal. She has no wheezes.  Abdominal: She exhibits no distension and no mass.  Musculoskeletal: She exhibits no edema.  Lymphadenopathy:    She has no cervical adenopathy.  Neurological: She is alert and oriented to person, place, and time.  Skin: Skin is warm and dry. No rash noted. She is not diaphoretic.  Psychiatric: Memory, affect and judgment normal.    Lab Results  Component Value Date   TSH 2.588 04/18/2013   Lab Results  Component Value Date   WBC 4.0 06/12/2014   HGB 12.0 06/12/2014   HCT 36.0 06/12/2014   MCV 117* 06/12/2014   PLT 555* 06/12/2014   Lab Results  Component Value Date   CREATININE 0.6 06/12/2014   BUN 15 06/12/2014   NA 137 06/12/2014   K 3.7 06/12/2014   CL 97* 06/12/2014   CO2 29 06/12/2014   Lab Results  Component Value Date   ALT 14 06/12/2014   AST 22 06/12/2014   ALKPHOS 46 06/12/2014   BILITOT 0.40 06/12/2014   Lab Results  Component Value Date   CHOL 169 04/18/2013   Lab  Results  Component Value Date   HDL 53 04/18/2013   Lab Results  Component Value Date   LDLCALC 98 04/18/2013   Lab Results  Component Value Date   TRIG 92 04/18/2013   Lab Results  Component Value Date   CHOLHDL 3.2 04/18/2013     Assessment & Plan  Esophageal reflux Avoid offending foods, start probiotics. Do not eat large meals in late evening and consider raising head of bed.   Constipation Encouraged increased hydration and fiber in diet. Daily probiotics. If bowels not moving can use MOM 2 tbls po in 4 oz of warm prune juice by mouth every 2-3 days. If no results then repeat in 4 hours with  Dulcolax suppository pr, may repeat again in 4 more hours as needed. Seek care if symptoms worsen. Consider daily Miralax and/or Dulcolax if symptoms persist.   Hypothyroid On Levothyroxine, continue to monitor  Hyperlipidemia Encouraged heart healthy diet, increase exercise, avoid trans fats, consider a krill oil cap daily  Acute pharyngitis Started on Cefdinir and probiotics, report worsening symptom. Encouraged increased rest and hydration, add probiotics, zinc such as Coldeze or Xicam. Treat fevers as needed

## 2014-07-06 NOTE — Patient Instructions (Signed)

## 2014-07-06 NOTE — Assessment & Plan Note (Signed)
Avoid offending foods, start probiotics. Do not eat large meals in late evening and consider raising head of bed.  

## 2014-07-08 LAB — URINE CULTURE
Colony Count: NO GROWTH
Organism ID, Bacteria: NO GROWTH

## 2014-07-13 ENCOUNTER — Other Ambulatory Visit: Payer: Self-pay | Admitting: Family Medicine

## 2014-07-13 ENCOUNTER — Encounter: Payer: Self-pay | Admitting: Family Medicine

## 2014-07-13 DIAGNOSIS — J029 Acute pharyngitis, unspecified: Secondary | ICD-10-CM | POA: Insufficient documentation

## 2014-07-13 NOTE — Assessment & Plan Note (Signed)
Started on Cefdinir and probiotics, report worsening symptom. Encouraged increased rest and hydration, add probiotics, zinc such as Coldeze or Xicam. Treat fevers as needed

## 2014-07-13 NOTE — Assessment & Plan Note (Signed)
Encouraged heart healthy diet, increase exercise, avoid trans fats, consider a krill oil cap daily 

## 2014-07-13 NOTE — Assessment & Plan Note (Signed)
Encouraged increased hydration and fiber in diet. Daily probiotics. If bowels not moving can use MOM 2 tbls po in 4 oz of warm prune juice by mouth every 2-3 days. If no results then repeat in 4 hours with  Dulcolax suppository pr, may repeat again in 4 more hours as needed. Seek care if symptoms worsen. Consider daily Miralax and/or Dulcolax if symptoms persist.  

## 2014-07-13 NOTE — Assessment & Plan Note (Signed)
On Levothyroxine, continue to monitor 

## 2014-07-17 ENCOUNTER — Other Ambulatory Visit: Payer: Self-pay | Admitting: Family Medicine

## 2014-07-27 ENCOUNTER — Other Ambulatory Visit: Payer: Self-pay | Admitting: Family Medicine

## 2014-07-28 NOTE — Telephone Encounter (Signed)
Rx sent to the pharmacy by e-script.//AB/CMA 

## 2014-08-08 ENCOUNTER — Other Ambulatory Visit: Payer: Self-pay | Admitting: Family Medicine

## 2014-08-13 ENCOUNTER — Other Ambulatory Visit: Payer: Self-pay | Admitting: Family Medicine

## 2014-08-13 NOTE — Telephone Encounter (Signed)
Request Tylenol # 3 300-30mg -Take 1 tablet by mouth every 6 hours as needed for moderate to severe pain. Last refill:06/22/14;#90,0 Last OV:07/06/14 No UDS done Please advise.//AB/CMA

## 2014-08-13 NOTE — Telephone Encounter (Signed)
I will sign but she will have to pick up and perform UDS

## 2014-08-14 NOTE — Telephone Encounter (Signed)
Rx filled on (08/14/14).//AB/CMA

## 2014-08-14 NOTE — Telephone Encounter (Signed)
LMOM @ 4:29pm @ [phone]) asking the pt to RTC regarding med refill request.//AB/CMA

## 2014-08-17 ENCOUNTER — Other Ambulatory Visit: Payer: Self-pay | Admitting: Family Medicine

## 2014-08-17 NOTE — Telephone Encounter (Signed)
Spoke with the pt and informed her that her refill request has been approved,but she will need to pick up the rx and will also need her to fill out a controlled substance agreement.  Pt verbalized understanding and stated that she will come today to pick up the rx.  Informed the pt that the rx will be placed up front.//AB/CMA

## 2014-08-18 ENCOUNTER — Other Ambulatory Visit: Payer: Self-pay | Admitting: *Deleted

## 2014-08-18 DIAGNOSIS — D473 Essential (hemorrhagic) thrombocythemia: Secondary | ICD-10-CM

## 2014-08-18 MED ORDER — HYDROXYUREA 500 MG PO CAPS: 500.0000 mg | ORAL_CAPSULE | Freq: Every day | ORAL | Status: DC

## 2014-08-24 ENCOUNTER — Other Ambulatory Visit: Payer: Self-pay | Admitting: Family Medicine

## 2014-08-25 ENCOUNTER — Other Ambulatory Visit: Payer: Self-pay | Admitting: *Deleted

## 2014-08-25 DIAGNOSIS — D473 Essential (hemorrhagic) thrombocythemia: Secondary | ICD-10-CM

## 2014-08-25 MED ORDER — HYDROXYUREA 500 MG PO CAPS: 500.0000 mg | ORAL_CAPSULE | Freq: Every day | ORAL | Status: DC

## 2014-08-26 ENCOUNTER — Other Ambulatory Visit: Payer: Self-pay | Admitting: *Deleted

## 2014-09-04 ENCOUNTER — Telehealth: Payer: Self-pay | Admitting: Family Medicine

## 2014-09-04 ENCOUNTER — Ambulatory Visit: Payer: BLUE CROSS/BLUE SHIELD | Admitting: Family Medicine

## 2014-09-04 NOTE — Telephone Encounter (Signed)
Error

## 2014-09-10 ENCOUNTER — Other Ambulatory Visit (HOSPITAL_BASED_OUTPATIENT_CLINIC_OR_DEPARTMENT_OTHER): Payer: BLUE CROSS/BLUE SHIELD | Admitting: Lab

## 2014-09-10 ENCOUNTER — Encounter: Payer: Self-pay | Admitting: Hematology & Oncology

## 2014-09-10 ENCOUNTER — Ambulatory Visit (HOSPITAL_BASED_OUTPATIENT_CLINIC_OR_DEPARTMENT_OTHER): Payer: BLUE CROSS/BLUE SHIELD | Admitting: Hematology & Oncology

## 2014-09-10 VITALS — BP 113/65 | HR 73 | Temp 97.8°F | Resp 14 | Ht 63.0 in | Wt 115.0 lb

## 2014-09-10 DIAGNOSIS — E033 Postinfectious hypothyroidism: Secondary | ICD-10-CM

## 2014-09-10 DIAGNOSIS — D473 Essential (hemorrhagic) thrombocythemia: Secondary | ICD-10-CM

## 2014-09-10 DIAGNOSIS — D509 Iron deficiency anemia, unspecified: Secondary | ICD-10-CM

## 2014-09-10 LAB — CMP (CANCER CENTER ONLY)
ALT(SGPT): 12 U/L (ref 10–47)
AST: 22 U/L (ref 11–38)
Albumin: 3.7 g/dL (ref 3.3–5.5)
Alkaline Phosphatase: 46 U/L (ref 26–84)
BUN, Bld: 18 mg/dL (ref 7–22)
CO2: 28 mEq/L (ref 18–33)
Calcium: 8.8 mg/dL (ref 8.0–10.3)
Chloride: 97 mEq/L — ABNORMAL LOW (ref 98–108)
Creat: 0.6 mg/dl (ref 0.6–1.2)
Glucose, Bld: 80 mg/dL (ref 73–118)
Potassium: 3.7 mEq/L (ref 3.3–4.7)
Sodium: 139 mEq/L (ref 128–145)
Total Bilirubin: 0.5 mg/dl (ref 0.20–1.60)
Total Protein: 6.8 g/dL (ref 6.4–8.1)

## 2014-09-10 LAB — TSH CHCC: TSH: 3.163 m(IU)/L (ref 0.308–3.960)

## 2014-09-10 LAB — CBC WITH DIFFERENTIAL (CANCER CENTER ONLY)
BASO#: 0 10*3/uL (ref 0.0–0.2)
BASO%: 0.6 % (ref 0.0–2.0)
EOS%: 4.7 % (ref 0.0–7.0)
Eosinophils Absolute: 0.2 10*3/uL (ref 0.0–0.5)
HCT: 35.7 % (ref 34.8–46.6)
HGB: 11.7 g/dL (ref 11.6–15.9)
LYMPH#: 1.1 10*3/uL (ref 0.9–3.3)
LYMPH%: 32.9 % (ref 14.0–48.0)
MCH: 39.4 pg — ABNORMAL HIGH (ref 26.0–34.0)
MCHC: 32.8 g/dL (ref 32.0–36.0)
MCV: 120 fL — ABNORMAL HIGH (ref 81–101)
MONO#: 0.3 10*3/uL (ref 0.1–0.9)
MONO%: 7.7 % (ref 0.0–13.0)
NEUT#: 1.8 10*3/uL (ref 1.5–6.5)
NEUT%: 54.1 % (ref 39.6–80.0)
Platelets: 451 10*3/uL — ABNORMAL HIGH (ref 145–400)
RBC: 2.97 10*6/uL — ABNORMAL LOW (ref 3.70–5.32)
RDW: 13 % (ref 11.1–15.7)
WBC: 3.4 10*3/uL — ABNORMAL LOW (ref 3.9–10.0)

## 2014-09-10 LAB — IRON AND TIBC CHCC
%SAT: 42 % (ref 21–57)
Iron: 86 ug/dL (ref 41–142)
TIBC: 205 ug/dL — ABNORMAL LOW (ref 236–444)
UIBC: 119 ug/dL — ABNORMAL LOW (ref 120–384)

## 2014-09-10 LAB — CHCC SATELLITE - SMEAR

## 2014-09-10 LAB — FERRITIN CHCC: Ferritin: 284 ng/ml — ABNORMAL HIGH (ref 9–269)

## 2014-09-10 MED ORDER — HYDROXYUREA 500 MG PO CAPS: 500.0000 mg | ORAL_CAPSULE | Freq: Two times a day (BID) | ORAL | Status: DC

## 2014-09-10 NOTE — Progress Notes (Signed)
Hematology and Oncology Follow Up Visit  BARRY CULVERHOUSE [id number] 1950-10-11 64 y.o. 09/10/2014   Principle Diagnosis:  Essential thrombocythemia - JAK2 negative. 2. Intermittent iron-deficiency anemia.  Current Therapy:   Hydrea 500 mg by mouth daily alternating with 1000mg  po q day.      Interim History:  Ms.  Gamm is back for followup. She got thru the holidays. Her next big "date" will be next week for her husband's birthday. He passed away 4 years ago.  She still has a hard time with this.  She has very good family support.  She is still working. She think about retiring. I told her that I thought that she should still work because really takes her mind off her husband's passing.  She's a Hydrea. She is doing okay with the Hydrea.  She does feel a little bit tired. Past she's had no change in bowel or bladder habits. She's had no cough. Patient she's had no fever. There's been no skin rashes.  Overall, her performance status is ECOG 2.  Medications:  Current outpatient prescriptions:  .  acetaminophen-codeine (TYLENOL #3) 300-30 MG per tablet, TAKE 1 TABLET BY MOUTH EVERY 6 HOURS AS NEEDED FOR MODERATE TO SEVERE PAIN, Disp: 90 tablet, Rfl: 0 .  albuterol (PROVENTIL HFA;VENTOLIN HFA) 108 (90 BASE) MCG/ACT inhaler, Inhale 2 puffs into the lungs every 6 (six) hours as needed for wheezing or shortness of breath., Disp: 1 Inhaler, Rfl: 1 .  ALPRAZolam (XANAX) 1 MG tablet, TAKE ONE TABLET BY MOUTH THREE TIMES A DAY AS NEEDED FOR ANXIETY, Disp: 90 tablet, Rfl: 2 .  aspirin EC 81 MG tablet, Take 81 mg by mouth every morning., Disp: , Rfl:  .  benzonatate (TESSALON) 100 MG capsule, Take 1 capsule (100 mg total) by mouth 3 (three) times daily as needed for cough., Disp: 30 capsule, Rfl: 1 .  Calcium Carbonate-Vitamin D (CALTRATE COLON HEALTH PO), Take by mouth., Disp: , Rfl:  .  citalopram (CELEXA) 40 MG tablet, Take 1 tablet (40 mg total) by mouth every morning., Disp: 30 tablet, Rfl:  3 .  Cranberry 300 MG tablet, Take 300 mg by mouth 2 (two) times daily., Disp: , Rfl:  .  estradiol (ESTRACE) 0.5 MG tablet, TAKE ONE TABLET BY MOUTH TWICE DAILY, Disp: 180 tablet, Rfl: 0 .  fluorometholone (FML) 0.1 % ophthalmic suspension, Place 1 drop into both eyes daily as needed (For dry eyes.). , Disp: , Rfl:  .  folic acid (FOLVITE) 1 MG tablet, Take 1 mg by mouth daily., Disp: , Rfl:  .  furosemide (LASIX) 20 MG tablet, TAKE  ONE TABLET BY MOUTH EVERY MORNING, Disp: 90 tablet, Rfl: 1 .  hydroxyurea (HYDREA) 500 MG capsule, Take 1 capsule (500 mg total) by mouth 2 (two) times daily. Take 500mg  daily, alternating with 1000mg  daily. 90 day supply., Disp: 180 capsule, Rfl: 3 .  levothyroxine (SYNTHROID, LEVOTHROID) 88 MCG tablet, TAKE ONE TABLET BY MOUTH ONE   TIME DAILY BEFORE BREAKFAST, Disp: 30 tablet, Rfl: 5 .  loratadine (CLARITIN) 10 MG tablet, TAKE 1 TABLET BY MOUTH ONCE A DAY AS NEEDED FOR ALLERGIES OR ITCHING, Disp: 30 tablet, Rfl: 10 .  montelukast (SINGULAIR) 10 MG tablet, TAKE ONE TABLET BY MOUTH AT BEDTIME, Disp: 30 tablet, Rfl: 2 .  Multiple Vitamin (MULTIVITAMIN PO), Take 1 tablet by mouth every morning. , Disp: , Rfl:  .  naproxen (NAPROSYN) 500 MG tablet, TAKE ONE TABLET BY MOUTH TWICE A DAY WITH MEALS,  Disp: 180 tablet, Rfl: 1 .  Omega-3 Fatty Acids (FISH OIL) 1000 MG CAPS, Take by mouth 2 (two) times daily., Disp: , Rfl:  .  omeprazole (PRILOSEC) 40 MG capsule, TAKE 1 CAPSULE (40 MG TOTAL)    BY MOUTH 2 (TWO) TIMES DAILY., Disp: 60 capsule, Rfl: 0 .  ondansetron (ZOFRAN) 8 MG tablet, Take 1 tablet (8 mg total) by mouth every 8 (eight) hours as needed for nausea or vomiting., Disp: 20 tablet, Rfl: 2 .  Probiotic Product (PROBIOTIC DAILY PO), Take by mouth 2 (two) times daily., Disp: , Rfl:  .  ranitidine (ZANTAC) 300 MG tablet, TAKE 1 TABLET BY MOUTH AT BEDTIME AS NEEDED FOR HEARTBURN, Disp: 30 tablet, Rfl: 2 .  venlafaxine XR (EFFEXOR-XR) 150 MG 24 hr capsule, Take 1 capsule  (150 mg total) by mouth daily with breakfast., Disp: 90 capsule, Rfl: 1 .  venlafaxine XR (EFFEXOR-XR) 75 MG 24 hr capsule, Take 1 capsule (75 mg total) by mouth daily with breakfast., Disp: 90 capsule, Rfl: 1 .  Vitamin D, Ergocalciferol, (DRISDOL) 50000 UNITS CAPS capsule, TAKE 1 CAPSULE BY MOUTH EVERY OTHER WEEK AS INSTRUCTED, Disp: 6 capsule, Rfl: 1  Current facility-administered medications:  .  cefTRIAXone (ROCEPHIN) injection 250 mg, 250 mg, Intramuscular, Q24H, Mosie Lukes, MD, 250 mg at 07/06/14 2423  Allergies: No Known Allergies  Past Medical History, Surgical history, Social history, and Family History were reviewed and updated.  Review of Systems: As above  Physical Exam:  height is 5\' 3"  (1.6 m) and weight is 115 lb (52.164 kg). Her oral temperature is 97.8 F (36.6 C). Her blood pressure is 113/65 and her pulse is 73. Her respiration is 14.   Thin white female. Head and neck exam shows no ocular or oral lesions. She has some slight temporal muscle wasting. There is no adenopathy in her neck. Lungs are clear. Cardiac exam regular rate and rhythm. No murmurs. Abdomen is soft. She has good bowel sounds. There is no fluid wave. There is no palpable liver or spleen. Back exam shows no tenderness over the spine ribs or hips. Extremities shows mild chronic nonpitting edema she has good range of motion of her joints. There is no cyanosis of her distal extremities. She has good pulses. . Skin exam no rashes. Neurological exam no focal deficits.  Lab Results  Component Value Date   WBC 3.4* 09/10/2014   HGB 11.7 09/10/2014   HCT 35.7 09/10/2014   MCV 120* 09/10/2014   PLT 451* 09/10/2014     Chemistry      Component Value Date/Time   NA 139 09/10/2014 0743   NA 138 07/06/2014 0923   K 3.7 09/10/2014 0743   K 3.7 07/06/2014 0923   CL 97* 09/10/2014 0743   CL 103 07/06/2014 0923   CO2 28 09/10/2014 0743   CO2 27 07/06/2014 0923   BUN 18 09/10/2014 0743   BUN 18  07/06/2014 0923   CREATININE 0.6 09/10/2014 0743   CREATININE 0.6 07/06/2014 0923      Component Value Date/Time   CALCIUM 8.8 09/10/2014 0743   CALCIUM 9.0 07/06/2014 0923   ALKPHOS 46 09/10/2014 0743   ALKPHOS 60 07/06/2014 0923   AST 22 09/10/2014 0743   AST 26 07/06/2014 0923   ALT 12 09/10/2014 0743   ALT 14 07/06/2014 0923   BILITOT 0.50 09/10/2014 0743   BILITOT 0.5 07/06/2014 0923         Impression and Plan: Ms. Hustead is  64 year old white female with essential thrombocythemia. Her platelet count is better. We will continue her on the alternating dose of Hydrea 500 mg and 1000 mg daily doses..    I'm just bothered that she is losing weight. We will have to watch this closely. We will see her iron studies show.  Her last prealbumin was holding steady.  We will plan to see her back in another 2-3 months.  [name], MD 2/11/20169:10 AM

## 2014-09-11 ENCOUNTER — Telehealth: Payer: Self-pay | Admitting: *Deleted

## 2014-09-11 NOTE — Telephone Encounter (Signed)
-----   Message from [name], MD sent at 09/10/2014  5:14 PM EST ----- Call - thyroid and iron are normal.  pete

## 2014-09-13 ENCOUNTER — Other Ambulatory Visit: Payer: Self-pay | Admitting: Family Medicine

## 2014-09-14 LAB — CALRETICULIN (CALR) MUTATION ANALYSIS: CALR Mutation (exon 9): NOT DETECTED

## 2014-09-14 LAB — LACTATE DEHYDROGENASE: LDH: 138 U/L (ref 94–250)

## 2014-09-14 NOTE — Telephone Encounter (Signed)
Faxed hardcopy for Alprazolam and Tylenol codeine to [facility]

## 2014-09-16 ENCOUNTER — Encounter: Payer: Self-pay | Admitting: Hematology & Oncology

## 2014-09-17 ENCOUNTER — Other Ambulatory Visit: Payer: Self-pay | Admitting: *Deleted

## 2014-09-20 ENCOUNTER — Other Ambulatory Visit: Payer: Self-pay | Admitting: Family Medicine

## 2014-09-21 ENCOUNTER — Telehealth: Payer: Self-pay | Admitting: *Deleted

## 2014-09-21 NOTE — Telephone Encounter (Signed)
Spoke with pharmacist at [facility] regarding dosing clarification for Hydrea. This is the second phone clarification as well as a fax clarification sent last week. [facility] will correct the information and send patient her supply ASAP. They do not believe that they can fill the 90 day supply request, but will attempt.

## 2014-09-23 ENCOUNTER — Encounter: Payer: Self-pay | Admitting: Family Medicine

## 2014-09-25 ENCOUNTER — Encounter: Payer: Self-pay | Admitting: Hematology & Oncology

## 2014-09-29 ENCOUNTER — Encounter: Payer: Self-pay | Admitting: Family Medicine

## 2014-09-29 ENCOUNTER — Ambulatory Visit (INDEPENDENT_AMBULATORY_CARE_PROVIDER_SITE_OTHER): Payer: BLUE CROSS/BLUE SHIELD | Admitting: Family Medicine

## 2014-09-29 VITALS — BP 120/82 | HR 84 | Temp 97.9°F | Ht 63.5 in | Wt 118.0 lb

## 2014-09-29 DIAGNOSIS — E039 Hypothyroidism, unspecified: Secondary | ICD-10-CM

## 2014-09-29 DIAGNOSIS — K219 Gastro-esophageal reflux disease without esophagitis: Secondary | ICD-10-CM

## 2014-09-29 DIAGNOSIS — D473 Essential (hemorrhagic) thrombocythemia: Secondary | ICD-10-CM

## 2014-09-29 DIAGNOSIS — D539 Nutritional anemia, unspecified: Secondary | ICD-10-CM

## 2014-09-29 DIAGNOSIS — E785 Hyperlipidemia, unspecified: Secondary | ICD-10-CM

## 2014-09-29 DIAGNOSIS — R634 Abnormal weight loss: Secondary | ICD-10-CM

## 2014-09-29 DIAGNOSIS — G2581 Restless legs syndrome: Secondary | ICD-10-CM

## 2014-09-29 MED ORDER — FUROSEMIDE 20 MG PO TABS: ORAL_TABLET | ORAL | Status: DC

## 2014-09-29 MED ORDER — PRAMIPEXOLE DIHYDROCHLORIDE 0.25 MG PO TABS: 0.2500 mg | ORAL_TABLET | Freq: Every evening | ORAL | Status: DC | PRN

## 2014-09-29 MED ORDER — VENLAFAXINE HCL ER 75 MG PO CP24: 75.0000 mg | ORAL_CAPSULE | Freq: Every day | ORAL | Status: DC

## 2014-09-29 MED ORDER — VENLAFAXINE HCL ER 150 MG PO CP24: 150.0000 mg | ORAL_CAPSULE | Freq: Every day | ORAL | Status: DC

## 2014-09-29 NOTE — Patient Instructions (Signed)
Restless Legs Syndrome Restless legs syndrome is a movement disorder. It may also be called a sensorimotor disorder.  CAUSES  No one knows what specifically causes restless legs syndrome, but it tends to run in families. It is also more common in people with low iron, in pregnancy, in people who need dialysis, and those with nerve damage (neuropathy).Some medications may make restless legs syndrome worse.Those medications include drugs to treat high blood pressure, some heart conditions, nausea, colds, allergies, and depression. SYMPTOMS Symptoms include uncomfortable sensations in the legs. These leg sensations are worse during periods of inactivity or rest. They are also worse while sitting or lying down. Individuals that have the disorder describe sensations in the legs that feel like:  Pulling.  Drawing.  Crawling.  Worming.  Boring.  Tingling.  Pins and needles.  Prickling.  Pain. The sensations are usually accompanied by an overwhelming urge to move the legs. Sudden muscle jerks may also occur. Movement provides temporary relief from the discomfort. In rare cases, the arms may also be affected. Symptoms may interfere with going to sleep (sleep onset insomnia). Restless legs syndrome may also be related to periodic limb movement disorder (PLMD). PLMD is another more common motor disorder. It also causes interrupted sleep. The symptoms from PLMD usually occur most often when you are awake. TREATMENT  Treatment for restless legs syndrome is symptomatic. This means that the symptoms are treated.   Massage and cold compresses may provide temporary relief.  Walk, stretch, or take a cold or hot bath.  Get regular exercise and a good night's sleep.  Avoid caffeine, alcohol, nicotine, and medications that can make it worse.  Do activities that provide mental stimulation like discussions, needlework, and video games. These may be helpful if you are not able to walk or stretch. Some  medications are effective in relieving the symptoms. However, many of these medications have side effects. Ask your caregiver about medications that may help your symptoms. Correcting iron deficiency may improve symptoms for some patients. Document Released: 07/07/2002 Document Revised: 12/01/2013 Document Reviewed: 10/13/2010 Exit[employer] Patient Information 2015 [employer]. This information is not intended to replace advice given to you by your health care provider. Make sure you discuss any questions you have with your health care provider.  

## 2014-09-29 NOTE — Progress Notes (Signed)
Pre visit review using our clinic review tool, if applicable. No additional management support is needed unless otherwise documented below in the visit note. 

## 2014-09-30 ENCOUNTER — Encounter: Payer: Self-pay | Admitting: Family Medicine

## 2014-09-30 DIAGNOSIS — G2581 Restless legs syndrome: Secondary | ICD-10-CM | POA: Insufficient documentation

## 2014-09-30 HISTORY — DX: Restless legs syndrome: G25.81

## 2014-09-30 NOTE — Assessment & Plan Note (Signed)
Follows routinely with hematology

## 2014-09-30 NOTE — Assessment & Plan Note (Signed)
Encouraged heart healthy diet, increase exercise, avoid trans fats, consider a krill oil cap daily 

## 2014-09-30 NOTE — Assessment & Plan Note (Signed)
Avoid offending foods, start probiotics. Do not eat large meals in late evening and consider raising head of bed.  

## 2014-09-30 NOTE — Assessment & Plan Note (Signed)
Increase hydration, wear quality shoes and started on Mirapex.

## 2014-09-30 NOTE — Progress Notes (Signed)
Rachael Johnson  [id number] 1950/08/13 09/30/2014      Progress Note-Follow Up  Subjective  Chief Complaint  Chief Complaint  Patient presents with  . Follow-up    HPI  Patient is a 64 y.o. female in today for routine medical care. Patient is in today for follow-up. Is struggling with some increased leg cramps worse at night but occurs during the day. Worse after long shifts at work. Has been trying to hydrate better but muscle cramps are still occurring. Has trouble getting settled uncomfortable at night. Denies CP/palp/SOB/HA/congestion/fevers/GI or GU c/o. Taking meds as prescribed  Past Medical History  Diagnosis Date  . Arthritis   . Allergy   . Thyroid disease   . Diverticulitis   . Frequent episodic tension-type headache   . Hypertension   . Seizure     childhood  . Anemia, iron deficiency 07/08/2012  . Depression with anxiety 03/24/2011  . Depression   . GERD (gastroesophageal reflux disease)   . Essential thrombocythemia 01/24/2011  . H. pylori infection 12/19/2012  . Other and unspecified hyperlipidemia 02/25/2013  . Preventative health care 04/20/2013  . Cerumen impaction 04/28/2013  . 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 Esophageal reflux 08/17/2013  . Acute pharyngitis 07/13/2014    Past Surgical History  Procedure Laterality Date  . Breast surgery  1992    biopsy, benign. Fibrocystic  . Abdominal hysterectomy      1992  . Umbilical hernia repair N/A 09/25/2012    Procedure: HERNIA REPAIR UMBILICAL ADULT;  Surgeon: Harl Bowie, MD;  Location: [facility];  Service: General;  Laterality: N/A;    Family History  Problem Relation Age of Onset  . Arthritis Mother   . Cancer Mother     ovarian  . Hypertension Mother   . Heart disease Mother     pacer  . Heart failure Mother   . Arthritis Father   . Cancer Father     lung  . Hypertension Father   . Cancer Sister     lung  . Cancer Brother     prostate  . Cancer Brother     lung  . Hypertension Son   .  COPD Brother   . Heart disease Brother   . Hypertension Brother   . Cancer Brother     colon  . Alcohol abuse Brother   . Cirrhosis Brother   . Seizures Sister   . Stroke Sister   . Arthritis Brother     History   Social History  . Marital Status: Widowed    Spouse Name: N/A  . Number of Children: 2  . Years of Education: N/A   Occupational History  . APL operator     [employer]   Social History Main Topics  . Smoking status: Former Smoker -- 3.50 packs/day for 20 years    Types: Cigarettes    Start date: 11/15/1970    Quit date: 07/31/1990  . Smokeless tobacco: Never Used     Comment: quit 23 years ago  . Alcohol Use: No  . Drug Use: No  . Sexual Activity: No   Other Topics Concern  . Not on file   Social History Narrative   Regular exercise: no   Caffeine Use: 2-3 weekly    Current Outpatient Prescriptions on File Prior to Visit  Medication Sig Dispense Refill  . acetaminophen-codeine (TYLENOL #3) 300-30 MG per tablet TAKE 1 TABLET BY MOUTH EVERY 6 HOURS AS NEEDED FOR MODERATE TO SEVERE  PAIN 90 tablet 0  . ALPRAZolam (XANAX) 1 MG tablet TAKE ONE TABLET BY MOUTH THREE TIMES A DAY AS NEEDED FOR ANXIETY 90 tablet 1  . aspirin EC 81 MG tablet Take 81 mg by mouth every morning.    . Calcium Carbonate-Vitamin D (CALTRATE COLON HEALTH PO) Take by mouth.    . citalopram (CELEXA) 40 MG tablet TAKE  ONE TABLET BY MOUTH EVERY MORNING 30 tablet 2  . Cranberry 300 MG tablet Take 300 mg by mouth 2 (two) times daily.    Marland Kitchen estradiol (ESTRACE) 0.5 MG tablet TAKE ONE TABLET BY MOUTH TWICE DAILY 180 tablet 0  . fluorometholone (FML) 0.1 % ophthalmic suspension Place 1 drop into both eyes daily as needed (For dry eyes.).     Marland Kitchen folic acid (FOLVITE) 1 MG tablet Take 1 mg by mouth daily.    . hydroxyurea (HYDREA) 500 MG capsule Take 1 capsule (500 mg total) by mouth 2 (two) times daily. Take 500mg  daily, alternating with 1000mg  daily. 90 day supply. 180 capsule 3  . levothyroxine  (SYNTHROID, LEVOTHROID) 88 MCG tablet TAKE ONE TABLET BY MOUTH ONE   TIME DAILY BEFORE BREAKFAST 30 tablet 5  . loratadine (CLARITIN) 10 MG tablet TAKE 1 TABLET BY MOUTH ONCE A DAY AS NEEDED FOR ALLERGIES OR ITCHING 30 tablet 10  . montelukast (SINGULAIR) 10 MG tablet TAKE ONE TABLET BY MOUTH AT BEDTIME 30 tablet 2  . Multiple Vitamin (MULTIVITAMIN PO) Take 1 tablet by mouth every morning.     . naproxen (NAPROSYN) 500 MG tablet TAKE ONE TABLET BY MOUTH TWICE A DAY WITH MEALS 180 tablet 1  . Omega-3 Fatty Acids (FISH OIL) 1000 MG CAPS Take by mouth 2 (two) times daily.    Marland Kitchen omeprazole (PRILOSEC) 40 MG capsule TAKE 1 CAPSULE (40 MG TOTAL)     BY MOUTH 2 (TWO) TIMES DAILY. 60 capsule 6  . ondansetron (ZOFRAN) 8 MG tablet Take 1 tablet (8 mg total) by mouth every 8 (eight) hours as needed for nausea or vomiting. 20 tablet 2  . Probiotic Product (PROBIOTIC DAILY PO) Take by mouth 2 (two) times daily.    . ranitidine (ZANTAC) 300 MG tablet TAKE 1 TABLET BY MOUTH AT BEDTIME AS NEEDED FOR HEARTBURN 30 tablet 2  . Vitamin D, Ergocalciferol, (DRISDOL) 50000 UNITS CAPS capsule TAKE 1 CAPSULE BY MOUTH EVERY OTHER WEEK AS INSTRUCTED 6 capsule 1  . albuterol (PROVENTIL HFA;VENTOLIN HFA) 108 (90 BASE) MCG/ACT inhaler Inhale 2 puffs into the lungs every 6 (six) hours as needed for wheezing or shortness of breath. (Patient not taking: Reported on 09/29/2014) 1 Inhaler 1   Current Facility-Administered Medications on File Prior to Visit  Medication Dose Route Frequency Provider Last Rate Last Dose  . cefTRIAXone (ROCEPHIN) injection 250 mg  250 mg Intramuscular Q24H Mosie Lukes, MD   250 mg at 07/06/14 7209    No Known Allergies  Review of Systems  Review of Systems  Constitutional: Negative for fever and malaise/fatigue.  HENT: Negative for congestion.   Eyes: Negative for discharge.  Respiratory: Negative for shortness of breath.   Cardiovascular: Negative for chest pain, palpitations and leg swelling.    Gastrointestinal: Negative for nausea, abdominal pain and diarrhea.  Genitourinary: Negative for dysuria.  Musculoskeletal: Negative for falls.  Skin: Negative for rash.  Neurological: Negative for loss of consciousness and headaches.  Endo/Heme/Allergies: Negative for polydipsia.  Psychiatric/Behavioral: Negative for depression and suicidal ideas. The patient is not nervous/anxious and does  not have insomnia.     Objective  BP 120/82 mmHg  Pulse 84  Temp(Src) 97.9 F (36.6 C) (Oral)  Ht 5' 3.5" (1.613 m)  Wt 118 lb (53.524 kg)  BMI 20.57 kg/m2  SpO2 94%  LMP 07/31/1990  Physical Exam  Physical Exam  Constitutional: She is oriented to person, place, and time and well-developed, well-nourished, and in no distress. No distress.  HENT:  Head: Normocephalic and atraumatic.  Eyes: Conjunctivae are normal.  Neck: Neck supple. No thyromegaly present.  Cardiovascular: Normal rate, regular rhythm and normal heart sounds.   No murmur heard. Pulmonary/Chest: Effort normal and breath sounds normal. She has no wheezes.  Abdominal: She exhibits no distension and no mass.  Musculoskeletal: She exhibits no edema.  Lymphadenopathy:    She has no cervical adenopathy.  Neurological: She is alert and oriented to person, place, and time.  Skin: Skin is warm and dry. No rash noted. She is not diaphoretic.  Psychiatric: Memory, affect and judgment normal.    Lab Results  Component Value Date   TSH 3.163 09/10/2014   Lab Results  Component Value Date   WBC 3.4* 09/10/2014   HGB 11.7 09/10/2014   HCT 35.7 09/10/2014   MCV 120* 09/10/2014   PLT 451* 09/10/2014   Lab Results  Component Value Date   CREATININE 0.6 09/10/2014   BUN 18 09/10/2014   NA 139 09/10/2014   K 3.7 09/10/2014   CL 97* 09/10/2014   CO2 28 09/10/2014   Lab Results  Component Value Date   ALT 12 09/10/2014   AST 22 09/10/2014   ALKPHOS 46 09/10/2014   BILITOT 0.50 09/10/2014   Lab Results  Component  Value Date   CHOL 178 07/06/2014   Lab Results  Component Value Date   HDL 77.90 07/06/2014   Lab Results  Component Value Date   LDLCALC 81 07/06/2014   Lab Results  Component Value Date   TRIG 96.0 07/06/2014   Lab Results  Component Value Date   CHOLHDL 2 07/06/2014     Assessment & Plan  Esophageal reflux Avoid offending foods, start probiotics. Do not eat large meals in late evening and consider raising head of bed.    Hyperlipidemia Encouraged heart healthy diet, increase exercise, avoid trans fats, consider a krill oil cap daily   Macrocytic anemia Increase leafy greens, consider increased lean red meat and using cast iron cookware. Continue to monitor, report any concerns   Weight loss Weight has stabilized and appetite is adequate   Hypothyroid On Levothyroxine, continue to monitor   Essential thrombocythemia Follows routinely with hematology   Restless leg syndrome Increase hydration, wear quality shoes and started on Mirapex.

## 2014-09-30 NOTE — Assessment & Plan Note (Signed)
Increase leafy greens, consider increased lean red meat and using cast iron cookware. Continue to monitor, report any concerns 

## 2014-09-30 NOTE — Assessment & Plan Note (Signed)
On Levothyroxine, continue to monitor 

## 2014-09-30 NOTE — Assessment & Plan Note (Signed)
Weight has stabilized and appetite is adequate

## 2014-10-01 ENCOUNTER — Encounter: Payer: Self-pay | Admitting: Family Medicine

## 2014-10-10 ENCOUNTER — Other Ambulatory Visit: Payer: Self-pay | Admitting: Family Medicine

## 2014-10-12 ENCOUNTER — Other Ambulatory Visit: Payer: Self-pay | Admitting: Family Medicine

## 2014-10-12 ENCOUNTER — Telehealth: Payer: Self-pay | Admitting: *Deleted

## 2014-10-12 NOTE — Telephone Encounter (Signed)
rx refilled- acetaminophen-codeine (TYLENOL #3) 300-30 MG per tablet Last OV- 09/29/14 Last refilled- 09/14/14 # 90 / 0 rf  UDS- 08/17/14 moderate risk.

## 2014-10-12 NOTE — Telephone Encounter (Signed)
I have signed her prescription, please have her sign a UDS

## 2014-10-12 NOTE — Telephone Encounter (Signed)
I think we already did this but if not OK to refill T&C#3 with same sig, same number

## 2014-10-13 MED ORDER — ACETAMINOPHEN-CODEINE #3 300-30 MG PO TABS: ORAL_TABLET | ORAL | Status: DC

## 2014-10-13 NOTE — Telephone Encounter (Signed)
Printed and put on counter for signature

## 2014-10-13 NOTE — Telephone Encounter (Signed)
Faxed hardcopy to [facility] in [place]. Called the patient informed to sign  UDS.   The patient stated she did sign a contact at her last OV.  The patient stated the reflux medication is not working.  She has to take tums at night to reduce the pain and would like something stronger sent in.

## 2014-10-13 NOTE — Telephone Encounter (Signed)
Has been filled and pt. Contacted.

## 2014-10-15 ENCOUNTER — Other Ambulatory Visit: Payer: Self-pay | Admitting: Family Medicine

## 2014-10-15 NOTE — Telephone Encounter (Signed)
Please advise on patients reflux medication not working well for her any more and has to add something stronger at night

## 2014-10-15 NOTE — Telephone Encounter (Signed)
There is really nothing stronger she is on a PPI twice a day and hi dose h2 blocker, we could send her to GI and/or try to switch her from Omeprazole to Pantoprazole it might help but they are similar so it might not

## 2014-10-16 NOTE — Telephone Encounter (Signed)
Called the patient informed of PCP response to problem.  The patient will stay on what she is currently taking and discuss at her next OV.

## 2014-10-16 NOTE — Telephone Encounter (Signed)
Called the patient left message to call back 

## 2014-10-30 ENCOUNTER — Other Ambulatory Visit: Payer: Self-pay | Admitting: Family Medicine

## 2014-11-03 ENCOUNTER — Other Ambulatory Visit: Payer: Self-pay | Admitting: Family Medicine

## 2014-11-04 ENCOUNTER — Other Ambulatory Visit: Payer: Self-pay | Admitting: Family Medicine

## 2014-11-07 ENCOUNTER — Other Ambulatory Visit: Payer: Self-pay | Admitting: Family Medicine

## 2014-11-09 ENCOUNTER — Other Ambulatory Visit: Payer: Self-pay | Admitting: Hematology & Oncology

## 2014-11-12 ENCOUNTER — Other Ambulatory Visit: Payer: Self-pay | Admitting: Family Medicine

## 2014-11-13 ENCOUNTER — Other Ambulatory Visit: Payer: Self-pay | Admitting: Family Medicine

## 2014-11-17 NOTE — Telephone Encounter (Signed)
Faxed hardcopy to [facility] [place]  

## 2014-11-28 ENCOUNTER — Other Ambulatory Visit: Payer: Self-pay | Admitting: Family Medicine

## 2014-11-30 NOTE — Telephone Encounter (Signed)
Faxed hardcopy for Alprazolam #90 with 0 refills to [facility].

## 2014-12-04 ENCOUNTER — Other Ambulatory Visit: Payer: Self-pay | Admitting: Hematology & Oncology

## 2014-12-09 ENCOUNTER — Other Ambulatory Visit: Payer: Self-pay | Admitting: Family Medicine

## 2014-12-09 NOTE — Telephone Encounter (Signed)
Medication refilled

## 2014-12-11 ENCOUNTER — Other Ambulatory Visit (HOSPITAL_BASED_OUTPATIENT_CLINIC_OR_DEPARTMENT_OTHER): Payer: BLUE CROSS/BLUE SHIELD

## 2014-12-11 ENCOUNTER — Ambulatory Visit (HOSPITAL_BASED_OUTPATIENT_CLINIC_OR_DEPARTMENT_OTHER): Payer: BLUE CROSS/BLUE SHIELD | Admitting: Hematology & Oncology

## 2014-12-11 ENCOUNTER — Encounter: Payer: Self-pay | Admitting: Hematology & Oncology

## 2014-12-11 VITALS — BP 127/70 | HR 71 | Temp 97.5°F | Resp 14 | Ht 63.0 in | Wt 118.0 lb

## 2014-12-11 DIAGNOSIS — D473 Essential (hemorrhagic) thrombocythemia: Secondary | ICD-10-CM

## 2014-12-11 DIAGNOSIS — D509 Iron deficiency anemia, unspecified: Secondary | ICD-10-CM

## 2014-12-11 LAB — COMPREHENSIVE METABOLIC PANEL
ALT: 11 U/L (ref 0–35)
AST: 16 U/L (ref 0–37)
Albumin: 3.9 g/dL (ref 3.5–5.2)
Alkaline Phosphatase: 58 U/L (ref 39–117)
BUN: 15 mg/dL (ref 6–23)
CO2: 21 mEq/L (ref 19–32)
Calcium: 8.7 mg/dL (ref 8.4–10.5)
Chloride: 103 mEq/L (ref 96–112)
Creatinine, Ser: 0.69 mg/dL (ref 0.50–1.10)
Glucose, Bld: 93 mg/dL (ref 70–99)
Potassium: 3.9 mEq/L (ref 3.5–5.3)
Sodium: 139 mEq/L (ref 135–145)
Total Bilirubin: 0.3 mg/dL (ref 0.2–1.2)
Total Protein: 6.5 g/dL (ref 6.0–8.3)

## 2014-12-11 LAB — CBC WITH DIFFERENTIAL (CANCER CENTER ONLY)
BASO#: 0 10*3/uL (ref 0.0–0.2)
BASO%: 0.5 % (ref 0.0–2.0)
EOS%: 3.2 % (ref 0.0–7.0)
Eosinophils Absolute: 0.1 10*3/uL (ref 0.0–0.5)
HCT: 35.1 % (ref 34.8–46.6)
HGB: 11.9 g/dL (ref 11.6–15.9)
LYMPH#: 1 10*3/uL (ref 0.9–3.3)
LYMPH%: 27.7 % (ref 14.0–48.0)
MCH: 40.8 pg — ABNORMAL HIGH (ref 26.0–34.0)
MCHC: 33.9 g/dL (ref 32.0–36.0)
MCV: 120 fL — ABNORMAL HIGH (ref 81–101)
MONO#: 0.2 10*3/uL (ref 0.1–0.9)
MONO%: 5.9 % (ref 0.0–13.0)
NEUT#: 2.3 10*3/uL (ref 1.5–6.5)
NEUT%: 62.7 % (ref 39.6–80.0)
Platelets: 385 10*3/uL (ref 145–400)
RBC: 2.92 10*6/uL — ABNORMAL LOW (ref 3.70–5.32)
RDW: 12.6 % (ref 11.1–15.7)
WBC: 3.7 10*3/uL — ABNORMAL LOW (ref 3.9–10.0)

## 2014-12-11 LAB — RETICULOCYTES (CHCC)
ABS Retic: 26.7 10*3/uL (ref 19.0–186.0)
RBC.: 2.97 MIL/uL — ABNORMAL LOW (ref 3.87–5.11)
Retic Ct Pct: 0.9 % (ref 0.4–2.3)

## 2014-12-11 LAB — FERRITIN CHCC: Ferritin: 169 ng/ml (ref 9–269)

## 2014-12-11 LAB — IRON AND TIBC CHCC
%SAT: 29 % (ref 21–57)
Iron: 62 ug/dL (ref 41–142)
TIBC: 213 ug/dL — ABNORMAL LOW (ref 236–444)
UIBC: 150 ug/dL (ref 120–384)

## 2014-12-11 LAB — CHCC SATELLITE - SMEAR

## 2014-12-11 NOTE — Progress Notes (Signed)
Hematology and Oncology Follow Up Visit  Rachael Johnson [id number] October 08, 1950 64 y.o. 12/11/2014   Principle Diagnosis:  Essential thrombocythemia - JAK2 negative. 2. Intermittent iron-deficiency anemia.  Current Therapy:   Hydrea 500 mg by mouth daily alternating with 1000mg  po q day.      Interim History:  [name] is back for followup. She is doing okay. She apparently had an episode of urticaria last week. It lasted about 3 days. She's under a lot of stress.  She still working. She enjoys work urine though there is a lot that she has to do.  She's had a decent appetite. Her appetite is holding steady.  We made an adjustment with her Hydrea dose last time. She is doing well with this.  She's had no problems with bowels or bladder. She's had no bleeding.  There's been no leg swelling. She fell back in February and her her right arm. She has a sleeve on the right arm which helps her.  Overall, her performance status is ECOG 2.  Medications:  Current outpatient prescriptions:  .  acetaminophen-codeine (TYLENOL #3) 300-30 MG per tablet, TAKE 1 TABLET BY MOUTH EVERY 6 HOURS AS NEEDED FOR MODERATE TO SEVERE PAIN, Disp: 90 tablet, Rfl: 0 .  albuterol (PROVENTIL HFA;VENTOLIN HFA) 108 (90 BASE) MCG/ACT inhaler, Inhale 2 puffs into the lungs every 6 (six) hours as needed for wheezing or shortness of breath., Disp: 1 Inhaler, Rfl: 1 .  ALPRAZolam (XANAX) 1 MG tablet, TAKE ONE TABLET BY MOUTH THREE TIMES A DAY AS NEEDED FOR ANXIETY, Disp: 90 tablet, Rfl: 0 .  aspirin EC 81 MG tablet, Take 81 mg by mouth every morning., Disp: , Rfl:  .  Calcium Carbonate-Vitamin D (CALTRATE COLON HEALTH PO), Take by mouth., Disp: , Rfl:  .  citalopram (CELEXA) 40 MG tablet, TAKE  ONE TABLET BY MOUTH EVERY MORNING, Disp: 30 tablet, Rfl: 2 .  Cranberry 300 MG tablet, Take 300 mg by mouth 2 (two) times daily., Disp: , Rfl:  .  estradiol (ESTRACE) 0.5 MG tablet, TAKE ONE TABLET BY MOUTH TWICE DAILY, Disp:  180 tablet, Rfl: 2 .  fluorometholone (FML) 0.1 % ophthalmic suspension, Place 1 drop into both eyes daily as needed (For dry eyes.). , Disp: , Rfl:  .  folic acid (FOLVITE) 1 MG tablet, Take 1 mg by mouth daily., Disp: , Rfl:  .  furosemide (LASIX) 20 MG tablet, TAKE  ONE TABLET BY MOUTH EVERY MORNING, Disp: 90 tablet, Rfl: 2 .  hydroxyurea (HYDREA) 500 MG capsule, Take 1 capsule (500 mg total) by mouth 2 (two) times daily. Take 500mg  daily, alternating with 1000mg  daily. 90 day supply., Disp: 180 capsule, Rfl: 3 .  levothyroxine (SYNTHROID, LEVOTHROID) 88 MCG tablet, TAKE ONE TABLET BY MOUTH ONE   TIME DAILY BEFORE BREAKFAST, Disp: 30 tablet, Rfl: 5 .  loratadine (CLARITIN) 10 MG tablet, TAKE 1 TABLET BY MOUTH ONCE A DAY AS NEEDED FOR ALLERGIES OR ITCHING, Disp: 30 tablet, Rfl: 10 .  montelukast (SINGULAIR) 10 MG tablet, TAKE ONE TABLET BY MOUTH AT BEDTIME, Disp: 30 tablet, Rfl: 6 .  Multiple Vitamin (MULTIVITAMIN PO), Take 1 tablet by mouth every morning. , Disp: , Rfl:  .  naproxen (NAPROSYN) 500 MG tablet, Take 1 tablet (500 mg total) by mouth 2 (two) times daily with a meal., Disp: 180 tablet, Rfl: 0 .  Omega-3 Fatty Acids (FISH OIL) 1000 MG CAPS, Take by mouth 2 (two) times daily., Disp: , Rfl:  .  omeprazole (PRILOSEC) 40 MG capsule, TAKE 1 CAPSULE (40 MG TOTAL)     BY MOUTH 2 (TWO) TIMES DAILY., Disp: 60 capsule, Rfl: 6 .  ondansetron (ZOFRAN) 8 MG tablet, TAKE ONE TABLET BY MOUTH EVERY 8 HOURS AS NEEDED NAUSEA AND VOMITING, Disp: 20 tablet, Rfl: 1 .  pramipexole (MIRAPEX) 0.25 MG tablet, Take 1-2 tablets (0.25-0.5 mg total) by mouth at bedtime as needed., Disp: 60 tablet, Rfl: 2 .  Probiotic Product (PROBIOTIC DAILY PO), Take by mouth 2 (two) times daily., Disp: , Rfl:  .  ranitidine (ZANTAC) 300 MG tablet, TAKE 1 TABLET BY MOUTH AT BEDTIME AS NEEDED FOR HEARTBURN, Disp: 30 tablet, Rfl: 6 .  venlafaxine XR (EFFEXOR-XR) 150 MG 24 hr capsule, Take 1 capsule (150 mg total) by mouth daily with  breakfast., Disp: 90 capsule, Rfl: 0 .  venlafaxine XR (EFFEXOR-XR) 75 MG 24 hr capsule, Take 1 capsule (75 mg total) by mouth daily with breakfast., Disp: 90 capsule, Rfl: 0 .  Vitamin D, Ergocalciferol, (DRISDOL) 50000 UNITS CAPS capsule, TAKE 1 CAPSULE BY MOUTH EVERY OTHER WEEK AS INSTRUCTED, Disp: 6 capsule, Rfl: 0  Current facility-administered medications:  .  cefTRIAXone (ROCEPHIN) injection 250 mg, 250 mg, Intramuscular, Q24H, Mosie Lukes, MD, 250 mg at 07/06/14 7846  Allergies: No Known Allergies  Past Medical History, Surgical history, Social history, and Family History were reviewed and updated.  Review of Systems: As above  Physical Exam:  height is 5\' 3"  (1.6 m) and weight is 118 lb (53.524 kg). Her oral temperature is 97.5 F (36.4 C). Her blood pressure is 127/70 and her pulse is 71. Her respiration is 14.   Thin white female. Head and neck exam shows no ocular or oral lesions. She has some slight temporal muscle wasting. There is no adenopathy in her neck. Lungs are clear. Cardiac exam regular rate and rhythm. No murmurs. Abdomen is soft. She has good bowel sounds. There is no fluid wave. There is no palpable liver or spleen. Back exam shows no tenderness over the spine ribs or hips. Extremities shows mild chronic nonpitting edema. she has good range of motion of her joints. She has a compression sleeve on her right arm. There is no swelling of the right arm. There is no cyanosis of her lower extremities. She has good pulses. . Skin exam no rashes. Neurological exam no focal deficits.  Lab Results  Component Value Date   WBC 3.7* 12/11/2014   HGB 11.9 12/11/2014   HCT 35.1 12/11/2014   MCV 120* 12/11/2014   PLT 385 12/11/2014     Chemistry      Component Value Date/Time   NA 139 09/10/2014 0743   NA 138 07/06/2014 0923   K 3.7 09/10/2014 0743   K 3.7 07/06/2014 0923   CL 97* 09/10/2014 0743   CL 103 07/06/2014 0923   CO2 28 09/10/2014 0743   CO2 27 07/06/2014  0923   BUN 18 09/10/2014 0743   BUN 18 07/06/2014 0923   CREATININE 0.6 09/10/2014 0743   CREATININE 0.6 07/06/2014 0923      Component Value Date/Time   CALCIUM 8.8 09/10/2014 0743   CALCIUM 9.0 07/06/2014 0923   ALKPHOS 46 09/10/2014 0743   ALKPHOS 60 07/06/2014 0923   AST 22 09/10/2014 0743   AST 26 07/06/2014 0923   ALT 12 09/10/2014 0743   ALT 14 07/06/2014 0923   BILITOT 0.50 09/10/2014 0743   BILITOT 0.5 07/06/2014 9629  Impression and Plan: Ms. Friedt is 64 year old white female with essential thrombocythemia. Her platelet count is better. We will continue her on the alternating dose of Hydrea 500 mg and 1000 mg daily doses.. Thankfully, her blood count is better. Her weight is holding stable. Hopefully her weight will go up we see her back.  I will see her back in 3 months.    [name], MD 5/13/20169:39 AM

## 2014-12-17 ENCOUNTER — Other Ambulatory Visit: Payer: Self-pay | Admitting: Orthopedic Surgery

## 2014-12-17 DIAGNOSIS — M542 Cervicalgia: Secondary | ICD-10-CM

## 2014-12-17 DIAGNOSIS — M25511 Pain in right shoulder: Secondary | ICD-10-CM

## 2014-12-21 ENCOUNTER — Encounter: Payer: Self-pay | Admitting: Family Medicine

## 2014-12-21 ENCOUNTER — Ambulatory Visit (INDEPENDENT_AMBULATORY_CARE_PROVIDER_SITE_OTHER): Payer: BLUE CROSS/BLUE SHIELD | Admitting: Family Medicine

## 2014-12-21 ENCOUNTER — Other Ambulatory Visit: Payer: Self-pay | Admitting: Family Medicine

## 2014-12-21 VITALS — BP 114/72 | HR 83 | Temp 97.8°F | Ht 63.5 in | Wt 117.2 lb

## 2014-12-21 DIAGNOSIS — E039 Hypothyroidism, unspecified: Secondary | ICD-10-CM

## 2014-12-21 DIAGNOSIS — M159 Polyosteoarthritis, unspecified: Secondary | ICD-10-CM

## 2014-12-21 DIAGNOSIS — K529 Noninfective gastroenteritis and colitis, unspecified: Secondary | ICD-10-CM

## 2014-12-21 DIAGNOSIS — K219 Gastro-esophageal reflux disease without esophagitis: Secondary | ICD-10-CM | POA: Diagnosis not present

## 2014-12-21 DIAGNOSIS — E785 Hyperlipidemia, unspecified: Secondary | ICD-10-CM

## 2014-12-21 DIAGNOSIS — N390 Urinary tract infection, site not specified: Secondary | ICD-10-CM | POA: Diagnosis not present

## 2014-12-21 DIAGNOSIS — R0789 Other chest pain: Secondary | ICD-10-CM

## 2014-12-21 DIAGNOSIS — D539 Nutritional anemia, unspecified: Secondary | ICD-10-CM | POA: Diagnosis not present

## 2014-12-21 DIAGNOSIS — T7840XD Allergy, unspecified, subsequent encounter: Secondary | ICD-10-CM | POA: Diagnosis not present

## 2014-12-21 DIAGNOSIS — R52 Pain, unspecified: Secondary | ICD-10-CM

## 2014-12-21 DIAGNOSIS — K5900 Constipation, unspecified: Secondary | ICD-10-CM

## 2014-12-21 DIAGNOSIS — H269 Unspecified cataract: Secondary | ICD-10-CM

## 2014-12-21 DIAGNOSIS — R079 Chest pain, unspecified: Secondary | ICD-10-CM

## 2014-12-21 HISTORY — DX: Noninfective gastroenteritis and colitis, unspecified: K52.9

## 2014-12-21 HISTORY — DX: Unspecified cataract: H26.9

## 2014-12-21 LAB — COMPREHENSIVE METABOLIC PANEL
ALT: 12 U/L (ref 0–35)
AST: 20 U/L (ref 0–37)
Albumin: 4.1 g/dL (ref 3.5–5.2)
Alkaline Phosphatase: 52 U/L (ref 39–117)
BUN: 14 mg/dL (ref 6–23)
CO2: 32 mEq/L (ref 19–32)
Calcium: 9 mg/dL (ref 8.4–10.5)
Chloride: 101 mEq/L (ref 96–112)
Creatinine, Ser: 0.66 mg/dL (ref 0.40–1.20)
GFR: 96.17 mL/min (ref >60.00)
Glucose, Bld: 83 mg/dL (ref 70–99)
Potassium: 3.8 mEq/L (ref 3.5–5.1)
Sodium: 138 mEq/L (ref 135–145)
Total Bilirubin: 0.3 mg/dL (ref 0.2–1.2)
Total Protein: 6.8 g/dL (ref 6.0–8.3)

## 2014-12-21 LAB — URINALYSIS
Bilirubin Urine: NEGATIVE
Hgb urine dipstick: NEGATIVE
Ketones, ur: NEGATIVE
Leukocytes, UA: NEGATIVE
Nitrite: NEGATIVE
Specific Gravity, Urine: 1.01 (ref 1.000–1.030)
Total Protein, Urine: NEGATIVE
Urine Glucose: NEGATIVE
Urobilinogen, UA: 0.2 (ref 0.0–1.0)
pH: 6 (ref 5.0–8.0)

## 2014-12-21 LAB — TSH: TSH: 4.32 u[IU]/mL (ref 0.35–4.50)

## 2014-12-21 MED ORDER — LEVOTHYROXINE SODIUM 88 MCG PO TABS: ORAL_TABLET | ORAL | Status: DC

## 2014-12-21 MED ORDER — ACETAMINOPHEN-CODEINE #3 300-30 MG PO TABS: ORAL_TABLET | ORAL | Status: DC

## 2014-12-21 NOTE — Assessment & Plan Note (Addendum)
A couple weeks ago had an episode of N/V and atypical CP with diaphoresis and then urticaria, all resolved over next day or so and has not recurred. Will proceed with EKG today

## 2014-12-21 NOTE — Assessment & Plan Note (Signed)
Was using Dr Hassell Done but he has left, was previously known as [employer]	[place]. Has an appt with [facility] tomorrow to discuss having her cataracts removed. Vision is worsening.

## 2014-12-21 NOTE — Assessment & Plan Note (Signed)
Mild, stable. Follows with hematology

## 2014-12-21 NOTE — Assessment & Plan Note (Signed)
On Levothyroxine, continue to monitor 

## 2014-12-21 NOTE — Assessment & Plan Note (Signed)
May use antihistamines prn, flonase prn

## 2014-12-21 NOTE — Assessment & Plan Note (Signed)
Encouraged increased hydration and fiber in diet. Daily probiotics. If bowels not moving can use MOM 2 tbls po in 4 oz of warm prune juice by mouth every 2-3 days. If no results then repeat in 4 hours with  Dulcolax suppository pr, may repeat again in 4 more hours as needed. Seek care if symptoms worsen. Consider daily Miralax and/or Dulcolax if symptoms persist. Only moving once weekly, add Miralax up to twice daily

## 2014-12-21 NOTE — Progress Notes (Signed)
Pre visit review using our clinic review tool, if applicable. No additional management support is needed unless otherwise documented below in the visit note. 

## 2014-12-21 NOTE — Assessment & Plan Note (Signed)
Encouraged heart healthy diet, increase exercise, avoid trans fats, consider a krill oil cap daily 

## 2014-12-21 NOTE — Patient Instructions (Signed)
Add Miralax daily and increase to twice daily, can consider the version with fiber added 64 oz of fluids daily   Constipation Constipation is when a person has fewer than three bowel movements a week, has difficulty having a bowel movement, or has stools that are dry, hard, or larger than normal. As people grow older, constipation is more common. If you try to fix constipation with medicines that make you have a bowel movement (laxatives), the problem may get worse. Long-term laxative use may cause the muscles of the colon to become weak. A low-fiber diet, not taking in enough fluids, and taking certain medicines may make constipation worse.  CAUSES   Certain medicines, such as antidepressants, pain medicine, iron supplements, antacids, and water pills.   Certain diseases, such as diabetes, irritable bowel syndrome (IBS), thyroid disease, or depression.   Not drinking enough water.   Not eating enough fiber-rich foods.   Stress or travel.   Lack of physical activity or exercise.   Ignoring the urge to have a bowel movement.   Using laxatives too much.  SIGNS AND SYMPTOMS   Having fewer than three bowel movements a week.   Straining to have a bowel movement.   Having stools that are hard, dry, or larger than normal.   Feeling full or bloated.   Pain in the lower abdomen.   Not feeling relief after having a bowel movement.  DIAGNOSIS  Your health care provider will take a medical history and perform a physical exam. Further testing may be done for severe constipation. Some tests may include:  A barium enema X-ray to examine your rectum, colon, and, sometimes, your small intestine.   A sigmoidoscopy to examine your lower colon.   A colonoscopy to examine your entire colon. TREATMENT  Treatment will depend on the severity of your constipation and what is causing it. Some dietary treatments include drinking more fluids and eating more fiber-rich foods.  Lifestyle treatments may include regular exercise. If these diet and lifestyle recommendations do not help, your health care provider may recommend taking over-the-counter laxative medicines to help you have bowel movements. Prescription medicines may be prescribed if over-the-counter medicines do not work.  HOME CARE INSTRUCTIONS   Eat foods that have a lot of fiber, such as fruits, vegetables, whole grains, and beans.  Limit foods high in fat and processed sugars, such as french fries, hamburgers, cookies, candies, and soda.   A fiber supplement may be added to your diet if you cannot get enough fiber from foods.   Drink enough fluids to keep your urine clear or pale yellow.   Exercise regularly or as directed by your health care provider.   Go to the restroom when you have the urge to go. Do not hold it.   Only take over-the-counter or prescription medicines as directed by your health care provider. Do not take other medicines for constipation without talking to your health care provider first.  [place] IF:   You have bright red blood in your stool.   Your constipation lasts for more than 4 days or gets worse.   You have abdominal or rectal pain.   You have thin, pencil-like stools.   You have unexplained weight loss. MAKE SURE YOU:   Understand these instructions.  Will watch your condition.  Will get help right away if you are not doing well or get worse. Document Released: 04/14/2004 Document Revised: 07/22/2013 Document Reviewed: 04/28/2013 [facility] Patient Information 2015 [place], [place].  This information is not intended to replace advice given to you by your health care provider. Make sure you discuss any questions you have with your health care provider.

## 2014-12-21 NOTE — Assessment & Plan Note (Signed)
Now struggling with right arm pain s/p fall and injury in Feb 2016. Has an MRI scheduled for June 8 to further investigatre. Has pain from neck down to elbow and they will continue to work on it. Given refill on Tylenol with codeine to help her manage til a definitive treatment is found

## 2014-12-21 NOTE — Progress Notes (Signed)
Rachael Johnson  [id number] Jul 02, 1951 12/21/2014      Progress Note-Follow Up  Subjective  Chief Complaint  Chief Complaint  Patient presents with  . Follow-up    HPI  Patient is a 64 y.o. female in today for routine medical care. Patient is in today for numerous concerns. She continues to struggle with constipation. Sometimes only moves her bowels once weekly. No bloody or tarry stool. Had an episode of n/v in the middle of the night. Had diaphresis, hives and cp with that. No recent illness otherwise and no recurrence. No sob or cp at this time. Denies CP/palp/SOB/HA/congestion/feversor GU c/o. Taking meds as prescribed.  Past Medical History  Diagnosis Date  . Arthritis   . Allergy   . Thyroid disease   . Diverticulitis   . Frequent episodic tension-type headache   . Hypertension   . Seizure     childhood  . Anemia, iron deficiency 07/08/2012  . Depression with anxiety 03/24/2011  . Depression   . GERD (gastroesophageal reflux disease)   . Essential thrombocythemia 01/24/2011  . H. pylori infection 12/19/2012  . Other and unspecified hyperlipidemia 02/25/2013  . Preventative health care 04/20/2013  . Cerumen impaction 04/28/2013  . 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 Esophageal reflux 08/17/2013  . Acute pharyngitis 07/13/2014  . Restless leg syndrome 09/30/2014  . Gastroenteritis 12/21/2014  . Cataract 12/21/2014    Past Surgical History  Procedure Laterality Date  . Breast surgery  1992    biopsy, benign. Fibrocystic  . Abdominal hysterectomy      1992  . Umbilical hernia repair N/A 09/25/2012    Procedure: HERNIA REPAIR UMBILICAL ADULT;  Surgeon: Harl Bowie, MD;  Location: [facility];  Service: General;  Laterality: N/A;    Family History  Problem Relation Age of Onset  . Arthritis Mother   . Cancer Mother     ovarian  . Hypertension Mother   . Heart disease Mother     pacer  . Heart failure Mother   . Arthritis Father   . Cancer Father     lung  .  Hypertension Father   . Cancer Sister     lung  . Cancer Brother     prostate  . Cancer Brother     lung  . Hypertension Son   . COPD Brother   . Heart disease Brother   . Hypertension Brother   . Cancer Brother     colon  . Alcohol abuse Brother   . Cirrhosis Brother   . Seizures Sister   . Stroke Sister   . Arthritis Brother     History   Social History  . Marital Status: Widowed    Spouse Name: N/A  . Number of Children: 2  . Years of Education: N/A   Occupational History  . APL operator     [employer]   Social History Main Topics  . Smoking status: Former Smoker -- 3.50 packs/day for 20 years    Types: Cigarettes    Start date: 11/15/1970    Quit date: 07/31/1990  . Smokeless tobacco: Never Used     Comment: quit 23 years ago  . Alcohol Use: No  . Drug Use: No  . Sexual Activity: No   Other Topics Concern  . Not on file   Social History Narrative   Regular exercise: no   Caffeine Use: 2-3 weekly    Current Outpatient Prescriptions on File Prior to Visit  Medication Sig Dispense  Refill  . albuterol (PROVENTIL HFA;VENTOLIN HFA) 108 (90 BASE) MCG/ACT inhaler Inhale 2 puffs into the lungs every 6 (six) hours as needed for wheezing or shortness of breath. 1 Inhaler 1  . ALPRAZolam (XANAX) 1 MG tablet TAKE ONE TABLET BY MOUTH THREE TIMES A DAY AS NEEDED FOR ANXIETY 90 tablet 0  . aspirin EC 81 MG tablet Take 81 mg by mouth every morning.    . Calcium Carbonate-Vitamin D (CALTRATE COLON HEALTH PO) Take by mouth.    . citalopram (CELEXA) 40 MG tablet TAKE  ONE TABLET BY MOUTH EVERY MORNING 30 tablet 2  . Cranberry 300 MG tablet Take 300 mg by mouth 2 (two) times daily.    Marland Kitchen estradiol (ESTRACE) 0.5 MG tablet TAKE ONE TABLET BY MOUTH TWICE DAILY 180 tablet 2  . fluorometholone (FML) 0.1 % ophthalmic suspension Place 1 drop into both eyes daily as needed (For dry eyes.).     Marland Kitchen folic acid (FOLVITE) 1 MG tablet Take 1 mg by mouth daily.    . furosemide (LASIX) 20  MG tablet TAKE  ONE TABLET BY MOUTH EVERY MORNING 90 tablet 2  . hydroxyurea (HYDREA) 500 MG capsule Take 1 capsule (500 mg total) by mouth 2 (two) times daily. Take 500mg  daily, alternating with 1000mg  daily. 90 day supply. 180 capsule 3  . loratadine (CLARITIN) 10 MG tablet TAKE 1 TABLET BY MOUTH ONCE A DAY AS NEEDED FOR ALLERGIES OR ITCHING 30 tablet 10  . montelukast (SINGULAIR) 10 MG tablet TAKE ONE TABLET BY MOUTH AT BEDTIME 30 tablet 6  . Multiple Vitamin (MULTIVITAMIN PO) Take 1 tablet by mouth every morning.     . naproxen (NAPROSYN) 500 MG tablet Take 1 tablet (500 mg total) by mouth 2 (two) times daily with a meal. 180 tablet 0  . Omega-3 Fatty Acids (FISH OIL) 1000 MG CAPS Take by mouth 2 (two) times daily.    Marland Kitchen omeprazole (PRILOSEC) 40 MG capsule TAKE 1 CAPSULE (40 MG TOTAL)     BY MOUTH 2 (TWO) TIMES DAILY. 60 capsule 6  . ondansetron (ZOFRAN) 8 MG tablet TAKE ONE TABLET BY MOUTH EVERY 8 HOURS AS NEEDED NAUSEA AND VOMITING 20 tablet 1  . pramipexole (MIRAPEX) 0.25 MG tablet Take 1-2 tablets (0.25-0.5 mg total) by mouth at bedtime as needed. 60 tablet 2  . Probiotic Product (PROBIOTIC DAILY PO) Take by mouth 2 (two) times daily.    . ranitidine (ZANTAC) 300 MG tablet TAKE 1 TABLET BY MOUTH AT BEDTIME AS NEEDED FOR HEARTBURN 30 tablet 6  . venlafaxine XR (EFFEXOR-XR) 150 MG 24 hr capsule Take 1 capsule (150 mg total) by mouth daily with breakfast. 90 capsule 0  . venlafaxine XR (EFFEXOR-XR) 75 MG 24 hr capsule Take 1 capsule (75 mg total) by mouth daily with breakfast. 90 capsule 0  . Vitamin D, Ergocalciferol, (DRISDOL) 50000 UNITS CAPS capsule TAKE 1 CAPSULE BY MOUTH EVERY OTHER WEEK AS INSTRUCTED 6 capsule 0   Current Facility-Administered Medications on File Prior to Visit  Medication Dose Route Frequency Provider Last Rate Last Dose  . cefTRIAXone (ROCEPHIN) injection 250 mg  250 mg Intramuscular Q24H Mosie Lukes, MD   250 mg at 07/06/14 4401    No Known Allergies  Review  of Systems  Review of Systems  Constitutional: Positive for malaise/fatigue. Negative for fever.  HENT: Negative for congestion.   Eyes: Negative for discharge.  Respiratory: Negative for shortness of breath.   Cardiovascular: Negative for chest pain, palpitations  and leg swelling.  Gastrointestinal: Positive for nausea, vomiting and constipation. Negative for abdominal pain and diarrhea.       N/v several weeks ago now resolved  Genitourinary: Negative for dysuria.  Musculoskeletal: Negative for falls.  Skin: Positive for rash.       With n/v now resolved  Neurological: Negative for loss of consciousness and headaches.  Endo/Heme/Allergies: Negative for polydipsia.  Psychiatric/Behavioral: Negative for depression and suicidal ideas. The patient is not nervous/anxious and does not have insomnia.     Objective  BP 114/72 mmHg  Pulse 83  Temp(Src) 97.8 F (36.6 C) (Oral)  Ht 5' 3.5" (1.613 m)  Wt 117 lb 4 oz (53.184 kg)  BMI 20.44 kg/m2  SpO2 97%  LMP 07/31/1990  Physical Exam  Physical Exam  Constitutional: She is oriented to person, place, and time and well-developed, well-nourished, and in no distress. No distress.  HENT:  Head: Normocephalic and atraumatic.  Eyes: Conjunctivae are normal.  Neck: Neck supple. No thyromegaly present.  Cardiovascular: Normal rate, regular rhythm and normal heart sounds.   No murmur heard. Pulmonary/Chest: Effort normal and breath sounds normal. She has no wheezes.  Abdominal: She exhibits no distension and no mass.  Musculoskeletal: She exhibits no edema.  Lymphadenopathy:    She has no cervical adenopathy.  Neurological: She is alert and oriented to person, place, and time.  Skin: Skin is warm and dry. No rash noted. She is not diaphoretic.  Psychiatric: Memory, affect and judgment normal.    Lab Results  Component Value Date   TSH 3.163 09/10/2014   Lab Results  Component Value Date   WBC 3.7* 12/11/2014   HGB 11.9  12/11/2014   HCT 35.1 12/11/2014   MCV 120* 12/11/2014   PLT 385 12/11/2014   Lab Results  Component Value Date   CREATININE 0.69 12/11/2014   BUN 15 12/11/2014   NA 139 12/11/2014   K 3.9 12/11/2014   CL 103 12/11/2014   CO2 21 12/11/2014   Lab Results  Component Value Date   ALT 11 12/11/2014   AST 16 12/11/2014   ALKPHOS 58 12/11/2014   BILITOT 0.3 12/11/2014   Lab Results  Component Value Date   CHOL 178 07/06/2014   Lab Results  Component Value Date   HDL 77.90 07/06/2014   Lab Results  Component Value Date   LDLCALC 81 07/06/2014   Lab Results  Component Value Date   TRIG 96.0 07/06/2014   Lab Results  Component Value Date   CHOLHDL 2 07/06/2014     Assessment & Plan  Esophageal reflux Avoid offending foods, start probiotics. Do not eat large meals in late evening and consider raising head of bed.    Hypothyroid On Levothyroxine, continue to monitor   Macrocytic anemia Mild, stable. Follows with hematology   Allergic state May use antihistamines prn, flonase prn   Hyperlipidemia Encouraged heart healthy diet, increase exercise, avoid trans fats, consider a krill oil cap daily   Gastroenteritis A couple weeks ago had an episode of N/V and atypical CP with diaphoresis and then urticaria, all resolved over next day or so and has not recurred. Will proceed with EKG today   Cataract Was using Dr Hassell Done but he has left, was previously known as [employer]	[place]. Has an appt with [facility] tomorrow to discuss having her cataracts removed. Vision is worsening.   Generalized OA Now struggling with right arm pain s/p fall and injury in Feb 2016. Has an MRI  scheduled for June 8 to further investigatre. Has pain from neck down to elbow and they will continue to work on it. Given refill on Tylenol with codeine to help her manage til a definitive treatment is found    Constipation Encouraged increased hydration and fiber in diet. Daily probiotics. If  bowels not moving can use MOM 2 tbls po in 4 oz of warm prune juice by mouth every 2-3 days. If no results then repeat in 4 hours with  Dulcolax suppository pr, may repeat again in 4 more hours as needed. Seek care if symptoms worsen. Consider daily Miralax and/or Dulcolax if symptoms persist. Only moving once weekly, add Miralax up to twice daily   Chest pain Patient describes an episode of waking with substernal, epigastric chest pain with associated anxiety, diaphoresis and n/v. No EKG changes. Will refer to cardiology for consideration

## 2014-12-21 NOTE — Assessment & Plan Note (Signed)
Avoid offending foods, start probiotics. Do not eat large meals in late evening and consider raising head of bed.  

## 2014-12-23 ENCOUNTER — Telehealth: Payer: Self-pay | Admitting: Family Medicine

## 2014-12-23 LAB — URINE CULTURE
Colony Count: NO GROWTH
Organism ID, Bacteria: NO GROWTH

## 2014-12-23 NOTE — Telephone Encounter (Signed)
Caller name:Vicky Relationship to patient:self Can be reached:[phone] Pharmacy:  Reason for call:wants to know if Dr Charlett Blake thinks she needs to go to Cardiology.  Does Dr B think from the ekg that  she had a mild heart attack.  If Dr B wants her to go to cardiology  She wants to see Dr Johnsie Cancel

## 2014-12-24 ENCOUNTER — Encounter: Payer: Self-pay | Admitting: Cardiovascular Disease

## 2014-12-24 ENCOUNTER — Ambulatory Visit (INDEPENDENT_AMBULATORY_CARE_PROVIDER_SITE_OTHER): Payer: BLUE CROSS/BLUE SHIELD | Admitting: Cardiovascular Disease

## 2014-12-24 ENCOUNTER — Other Ambulatory Visit: Payer: Self-pay | Admitting: Family Medicine

## 2014-12-24 VITALS — BP 134/84 | HR 95 | Ht 63.5 in | Wt 119.9 lb

## 2014-12-24 DIAGNOSIS — R0789 Other chest pain: Secondary | ICD-10-CM

## 2014-12-24 DIAGNOSIS — R079 Chest pain, unspecified: Secondary | ICD-10-CM

## 2014-12-24 NOTE — Progress Notes (Signed)
12/24/2014 Rachael Johnson   07/05/1951  [id number]  Primary Physician Rachael Homans, MD Primary Cardiologist: Rachael Harp MD Rachael Johnson   HPI:  Ms Johnson is a 64 year old ill-appearing widowed Caucasian female (husband died 17 years ago in 02/18/2023), mother of 93, grandmother of 5 grandchildren who is accompanied  by her sister today. She was referred by Dr. Gwyneth Johnson for cardiovascular evaluation because of chest pain. Her only cardiovascular risk factor is family history with a sister has had bypass surgery. She works as a Rachael Johnson. She has never had a heart attack or stroke. She does have essential thrombocytosis treated by Rachael Johnson , hematologist oncologist, with chemotherapy. She had an episode of substernal chest pain with nausea and diaphoresis 2 weeks ago and has had subsequent nausea since. She also complains of some dyspnea on exertion.   Current Outpatient Prescriptions  Medication Sig Dispense Refill  . acetaminophen-codeine (TYLENOL #3) 300-30 MG per tablet TAKE 1 TABLET BY MOUTH EVERY 6 HOURS AS NEEDED FOR MODERATE TO SEVERE PAIN 90 tablet 0  . ALPRAZolam (XANAX) 1 MG tablet TAKE ONE TABLET BY MOUTH THREE TIMES A DAY AS NEEDED FOR ANXIETY 90 tablet 0  . aspirin EC 81 MG tablet Take 81 mg by mouth every morning.    . Calcium Carbonate-Vitamin D (CALTRATE COLON HEALTH PO) Take by mouth.    . citalopram (CELEXA) 40 MG tablet TAKE  ONE TABLET BY MOUTH EVERY MORNING 30 tablet 2  . Cranberry 300 MG tablet Take 300 mg by mouth 2 (two) times daily.    Marland Kitchen estradiol (ESTRACE) 0.5 MG tablet TAKE ONE TABLET BY MOUTH TWICE DAILY 180 tablet 2  . fluorometholone (FML) 0.1 % ophthalmic suspension Place 1 drop into both eyes daily as needed (For dry eyes.).     Marland Kitchen folic acid (FOLVITE) 1 MG tablet Take 1 mg by mouth daily.    . furosemide (LASIX) 20 MG tablet TAKE  ONE TABLET BY MOUTH EVERY MORNING 90 tablet 2  . hydroxyurea (HYDREA) 500 MG capsule Take 1  capsule (500 mg total) by mouth 2 (two) times daily. Take 500mg  daily, alternating with 1000mg  daily. 90 day supply. 180 capsule 3  . levothyroxine (SYNTHROID, LEVOTHROID) 88 MCG tablet TAKE ONE TABLET BY MOUTH ONE   TIME DAILY BEFORE BREAKFAST 30 tablet 6  . loratadine (CLARITIN) 10 MG tablet TAKE 1 TABLET BY MOUTH ONCE A DAY AS NEEDED FOR ALLERGIES OR ITCHING 30 tablet 10  . montelukast (SINGULAIR) 10 MG tablet TAKE ONE TABLET BY MOUTH AT BEDTIME 30 tablet 6  . Multiple Vitamin (MULTIVITAMIN PO) Take 1 tablet by mouth every morning.     . naproxen (NAPROSYN) 500 MG tablet Take 1 tablet (500 mg total) by mouth 2 (two) times daily with a meal. 180 tablet 0  . Omega-3 Fatty Acids (FISH OIL) 1000 MG CAPS Take by mouth 2 (two) times daily.    Marland Kitchen omeprazole (PRILOSEC) 40 MG capsule TAKE 1 CAPSULE (40 MG TOTAL)     BY MOUTH 2 (TWO) TIMES DAILY. 60 capsule 6  . ondansetron (ZOFRAN) 8 MG tablet TAKE ONE TABLET BY MOUTH EVERY 8 HOURS AS NEEDED NAUSEA AND VOMITING 20 tablet 1  . pramipexole (MIRAPEX) 0.25 MG tablet Take 1-2 tablets (0.25-0.5 mg total) by mouth at bedtime as needed. 60 tablet 2  . Probiotic Product (PROBIOTIC DAILY PO) Take by mouth 2 (two) times daily.    . ranitidine (ZANTAC) 300 MG tablet TAKE  1 TABLET BY MOUTH AT BEDTIME AS NEEDED FOR HEARTBURN 30 tablet 6  . venlafaxine XR (EFFEXOR-XR) 150 MG 24 hr capsule Take 1 capsule (150 mg total) by mouth daily with breakfast. 90 capsule 0  . venlafaxine XR (EFFEXOR-XR) 75 MG 24 hr capsule Take 1 capsule (75 mg total) by mouth daily with breakfast. 90 capsule 0  . VENTOLIN HFA 108 (90 BASE) MCG/ACT inhaler INHALE 2 PUFFS INTO THE LUNGS EVERY 6 (SIX) HOURS AS  NEEDED FOR WHEEZI NG OR SHORTNESS OF BREATH. 18 g 8  . Vitamin D, Ergocalciferol, (DRISDOL) 50000 UNITS CAPS capsule TAKE 1 CAPSULE BY MOUTH EVERY OTHER WEEK AS INSTRUCTED 6 capsule 0   No current facility-administered medications for this visit.    No Known Allergies  History   Social  History  . Marital Status: Widowed    Spouse Name: N/A  . Number of Children: 2  . Years of Education: N/A   Occupational History  . APL operator     [employer]   Social History Main Topics  . Smoking status: Former Smoker -- 3.50 packs/day for 20 years    Types: Cigarettes    Start date: 11/15/1970    Quit date: 07/31/1990  . Smokeless tobacco: Never Used     Comment: quit 23 years ago  . Alcohol Use: No  . Drug Use: No  . Sexual Activity: No   Other Topics Concern  . Not on file   Social History Narrative   Regular exercise: no   Caffeine Use: 2-3 weekly     Review of Systems: General: negative for chills, fever, night sweats or weight changes.  Cardiovascular: negative for chest pain, dyspnea on exertion, edema, orthopnea, palpitations, paroxysmal nocturnal dyspnea or shortness of breath Dermatological: negative for rash Respiratory: negative for cough or wheezing Urologic: negative for hematuria Abdominal: negative for nausea, vomiting, diarrhea, bright red blood per rectum, melena, or hematemesis Neurologic: negative for visual changes, syncope, or dizziness All other systems reviewed and are otherwise negative except as noted above.    Blood pressure 134/84, pulse 95, height 5' 3.5" (1.613 m), weight 119 lb 14.4 oz (54.386 kg), last menstrual period 07/31/1990.  General appearance: alert and no distress Neck: no adenopathy, no carotid bruit, no JVD, supple, symmetrical, trachea midline and thyroid not enlarged, symmetric, no tenderness/mass/nodules Lungs: clear to auscultation bilaterally Heart: regular rate and rhythm, S1, S2 normal, no murmur, click, rub or gallop Extremities: extremities normal, atraumatic, no cyanosis or edema  EKG normal sinus rhythm at 95 without ST or T-wave changes. I personally reviewed this EKG  ASSESSMENT AND PLAN:   Chest pain Rachael Johnson was referred to me by Rachael Johnson evaluation of chest pain. She has a history of  remote tobacco abuse and family history of heart disease with a sister has had bypass surgery. She has never had a heart attack or stroke. She works as a Rachael Johnson. She had episode of substernal chest pain 2 weeks ago associated with nausea and diaphoresis. She's had subsequent episodes of nausea since. 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 I'm going to order 2-D echocardiogram and exercise Myoview to rule out an ischemic etiology and we'll see back after that for further evaluation. Rachael Harp MD FACP,FACC,FAHA, [facility] 12/24/2014 2:57 PM

## 2014-12-24 NOTE — Assessment & Plan Note (Signed)
Rachael Johnson was referred to me by Dr. Berdine Addison evaluation of chest pain. She has a history of remote tobacco abuse and family history of heart disease with a sister has had bypass surgery. She has never had a heart attack or stroke. She works as a [name]. She had episode of substernal chest pain 2 weeks ago associated with nausea and diaphoresis. She's had subsequent episodes of nausea since. 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 Burney Gauze. I'm going to order 2-D echocardiogram and exercise Myoview to rule out an ischemic etiology and we'll see back after that for further evaluation. Marland Kitchen

## 2014-12-24 NOTE — Telephone Encounter (Signed)
Patient informed. 

## 2014-12-24 NOTE — Telephone Encounter (Signed)
The ekg does not make me think she had a mild heart attack but a cardiology consult is the prudent thing to do given her symptoms. Will send note to use Dr Johnsie Cancel

## 2014-12-24 NOTE — Patient Instructions (Signed)
  We will see you back in follow up after the tests.   Dr Berry has ordered: 1.  Echocardiogram. Echocardiography is a painless test that uses sound waves to create images of your heart. It provides your doctor with information about the size and shape of your heart and how well your heart's chambers and valves are working. This procedure takes approximately one hour. There are no restrictions for this procedure.   2. Exercise Myoview- this is a test that looks at the blood flow to your heart muscle.  It takes approximately 2 1/2 hours. Please follow instruction sheet, as given.      

## 2014-12-24 NOTE — Telephone Encounter (Signed)
I placed the cardiology referral

## 2014-12-28 NOTE — Assessment & Plan Note (Signed)
Patient describes an episode of waking with substernal, epigastric chest pain with associated anxiety, diaphoresis and n/v. No EKG changes. Will refer to cardiology for consideration

## 2014-12-31 ENCOUNTER — Telehealth (HOSPITAL_COMMUNITY): Payer: Self-pay | Admitting: *Deleted

## 2014-12-31 ENCOUNTER — Telehealth: Payer: Self-pay | Admitting: Cardiovascular Disease

## 2014-12-31 DIAGNOSIS — R072 Precordial pain: Secondary | ICD-10-CM

## 2014-12-31 NOTE — Telephone Encounter (Signed)
Sure, okay to change to lexiscan

## 2014-12-31 NOTE — Telephone Encounter (Signed)
Pt would like to know if she can have a Lexiscan stress test done instead of the Exercise  Please advise

## 2014-12-31 NOTE — Telephone Encounter (Signed)
Message sent to [facility].

## 2014-12-31 NOTE — Telephone Encounter (Signed)
Patient is scheduled for an exercise stress test.  She states she cannot walk on the treadmill--can she be changed to the chemical test?

## 2015-01-01 NOTE — Telephone Encounter (Signed)
yes

## 2015-01-01 NOTE — Telephone Encounter (Signed)
Returned call to patient left message on personal voice mail Dr.Berry advised ok to change stress myoview to lexiscan.Message sent to schedulers to change.

## 2015-01-06 ENCOUNTER — Ambulatory Visit
Admission: RE | Admit: 2015-01-06 | Discharge: 2015-01-06 | Disposition: A | Discharge status: Home / Self Care | Payer: BLUE CROSS/BLUE SHIELD | Source: Ambulatory Visit | Attending: Orthopedic Surgery | Admitting: Orthopedic Surgery

## 2015-01-06 DIAGNOSIS — M25511 Pain in right shoulder: Secondary | ICD-10-CM

## 2015-01-06 DIAGNOSIS — M542 Cervicalgia: Secondary | ICD-10-CM

## 2015-01-06 MED ORDER — IOHEXOL 180 MG/ML  SOLN
15.0000 mL | Freq: Once | INTRAMUSCULAR | Status: AC | PRN
  Administered 2015-01-06: 15 mL via INTRA_ARTICULAR

## 2015-01-08 ENCOUNTER — Telehealth (HOSPITAL_COMMUNITY): Payer: Self-pay

## 2015-01-08 NOTE — Telephone Encounter (Signed)
Encounter complete. 

## 2015-01-12 ENCOUNTER — Other Ambulatory Visit: Payer: Self-pay | Admitting: Family Medicine

## 2015-01-12 NOTE — Telephone Encounter (Signed)
Requesting:   ALPRAZOLAM Contract   SIGNED ON 08/18/14 UDS   GIVEN ON 08/18/14  MODERATE Last OV   12/21/14 Last Refill    #90 WITH 0 REFILLS ON 11/30/14  Please Advise

## 2015-01-12 NOTE — Telephone Encounter (Signed)
Will agree to refill in PCP absence. Printed and signed.  She is 2 months overdue for UDS per her records. She will need to pick up Rx and give UDS sample.

## 2015-01-12 NOTE — Telephone Encounter (Signed)
Called the patient informed of PCP's instructions regarding refill/UDS.  The patient did verbally understand/agree to instructions and will be by the office possible today to do UDS and pickup hardcopy for Alprazolam.

## 2015-01-13 ENCOUNTER — Other Ambulatory Visit (HOSPITAL_COMMUNITY): Payer: BLUE CROSS/BLUE SHIELD

## 2015-01-13 ENCOUNTER — Ambulatory Visit (HOSPITAL_BASED_OUTPATIENT_CLINIC_OR_DEPARTMENT_OTHER)
Admission: RE | Admit: 2015-01-13 | Discharge: 2015-01-13 | Disposition: A | Discharge status: Home / Self Care | Payer: BLUE CROSS/BLUE SHIELD | Source: Ambulatory Visit | Attending: Cardiovascular Disease | Admitting: Cardiovascular Disease

## 2015-01-13 ENCOUNTER — Other Ambulatory Visit: Payer: Self-pay | Admitting: Family Medicine

## 2015-01-13 ENCOUNTER — Ambulatory Visit (HOSPITAL_COMMUNITY): Payer: BLUE CROSS/BLUE SHIELD

## 2015-01-13 ENCOUNTER — Ambulatory Visit (HOSPITAL_COMMUNITY)
Admission: RE | Admit: 2015-01-13 | Discharge: 2015-01-13 | Disposition: A | Discharge status: Home / Self Care | Payer: BLUE CROSS/BLUE SHIELD | Source: Ambulatory Visit | Attending: Cardiology | Admitting: Cardiology

## 2015-01-13 DIAGNOSIS — R079 Chest pain, unspecified: Secondary | ICD-10-CM | POA: Diagnosis not present

## 2015-01-13 DIAGNOSIS — R072 Precordial pain: Secondary | ICD-10-CM | POA: Diagnosis not present

## 2015-01-13 DIAGNOSIS — R5383 Other fatigue: Secondary | ICD-10-CM | POA: Insufficient documentation

## 2015-01-13 DIAGNOSIS — Z8249 Family history of ischemic heart disease and other diseases of the circulatory system: Secondary | ICD-10-CM | POA: Insufficient documentation

## 2015-01-13 DIAGNOSIS — Z86718 Personal history of other venous thrombosis and embolism: Secondary | ICD-10-CM | POA: Insufficient documentation

## 2015-01-13 DIAGNOSIS — R0609 Other forms of dyspnea: Secondary | ICD-10-CM | POA: Insufficient documentation

## 2015-01-13 DIAGNOSIS — Z87891 Personal history of nicotine dependence: Secondary | ICD-10-CM | POA: Diagnosis not present

## 2015-01-13 DIAGNOSIS — R110 Nausea: Secondary | ICD-10-CM | POA: Diagnosis not present

## 2015-01-13 DIAGNOSIS — R42 Dizziness and giddiness: Secondary | ICD-10-CM | POA: Insufficient documentation

## 2015-01-13 LAB — MYOCARDIAL PERFUSION IMAGING
LV dias vol: 85 mL
LV sys vol: 37 mL
Nuc Stress EF: 56 %
Peak HR: 92 {beats}/min
Rest HR: 64 {beats}/min
SDS: 1
SRS: 0
SSS: 1
TID: 1.1

## 2015-01-13 MED ORDER — TECHNETIUM TC 99M SESTAMIBI GENERIC - CARDIOLITE
10.2000 | Freq: Once | INTRAVENOUS | Status: AC | PRN
  Administered 2015-01-13: 10 via INTRAVENOUS

## 2015-01-13 MED ORDER — TECHNETIUM TC 99M SESTAMIBI GENERIC - CARDIOLITE
30.5000 | Freq: Once | INTRAVENOUS | Status: AC | PRN
  Administered 2015-01-13: 31 via INTRAVENOUS

## 2015-01-13 MED ORDER — REGADENOSON 0.4 MG/5ML IV SOLN
0.4000 mg | Freq: Once | INTRAVENOUS | Status: AC
  Administered 2015-01-13: 0.4 mg via INTRAVENOUS

## 2015-01-13 NOTE — Telephone Encounter (Signed)
It looks like she is requesting refill too early. She can have a refill next week

## 2015-01-14 ENCOUNTER — Telehealth: Payer: Self-pay | Admitting: Family Medicine

## 2015-01-14 NOTE — Telephone Encounter (Signed)
Called left msg. To call back 

## 2015-01-14 NOTE — Telephone Encounter (Signed)
Patient informed per PCP instructions refill one week too early.

## 2015-01-14 NOTE — Telephone Encounter (Signed)
Caller name: Diane Hanel Relationship to patient: self Can be reached: [phone] [facility]: [facility] in [place]  Reason for call: Pt calling for refill on acetaminophen-codeine (TYLENOL #3) 300-30 MG per tablet. She has about a week left. Pt also mentioned she had a fall in [place] on a wet floor. She is sore but said she is ok.

## 2015-01-15 ENCOUNTER — Other Ambulatory Visit (HOSPITAL_COMMUNITY): Payer: BLUE CROSS/BLUE SHIELD

## 2015-01-15 ENCOUNTER — Encounter (HOSPITAL_COMMUNITY): Payer: BLUE CROSS/BLUE SHIELD

## 2015-01-16 ENCOUNTER — Other Ambulatory Visit: Payer: Self-pay | Admitting: Family Medicine

## 2015-01-16 NOTE — Telephone Encounter (Signed)
Can refill on 01/19/15

## 2015-01-19 NOTE — Telephone Encounter (Signed)
Faxed hardcopy to [facility] and patient informed

## 2015-01-19 NOTE — Telephone Encounter (Signed)
Printed and on counter for signature. 

## 2015-01-22 ENCOUNTER — Encounter: Payer: Self-pay | Admitting: Cardiovascular Disease

## 2015-01-22 ENCOUNTER — Ambulatory Visit (INDEPENDENT_AMBULATORY_CARE_PROVIDER_SITE_OTHER): Payer: BLUE CROSS/BLUE SHIELD | Admitting: Cardiovascular Disease

## 2015-01-22 VITALS — BP 124/76 | HR 68 | Ht 63.0 in | Wt 122.0 lb

## 2015-01-22 DIAGNOSIS — R079 Chest pain, unspecified: Secondary | ICD-10-CM | POA: Diagnosis not present

## 2015-01-22 NOTE — Progress Notes (Signed)
Mrs. Rachael Johnson returns today for follow-up. Her 2-D echo and Myoview stress test were entirely normal. I've reassured her that the likelihood that her symptoms are cardiovascular nature are small but not 0. We'll continue to follow her medically and she'll be seen by mid-level provider in 6 months. If she remains fairly stable at that time she can be seen back on an as-needed basis.  Lorretta Harp, M.D., [place], [place], [place] [facility] [place] [address] [place], [place]  [id number]  [phone] 01/22/2015 10:10 AM

## 2015-01-22 NOTE — Assessment & Plan Note (Signed)
Rachael Johnson returns today for follow-up. Her 2-D echo and Myoview stress test were entirely normal. I've reassured her that the likelihood that her symptoms are cardiovascular nature are small but not 0. We'll continue to follow her medically and she'll be seen by mid-level provider in 6 months. If she remains fairly stable at that time she can be seen back on an as-needed basis.

## 2015-01-22 NOTE — Patient Instructions (Signed)
Dr Gwenlyn Found recommends that you schedule a follow-up appointment in 6 months with an extender, then as needed after that.

## 2015-02-10 ENCOUNTER — Ambulatory Visit: Payer: BLUE CROSS/BLUE SHIELD | Admitting: Cardiovascular Disease

## 2015-02-11 ENCOUNTER — Other Ambulatory Visit: Payer: Self-pay | Admitting: Family Medicine

## 2015-02-11 ENCOUNTER — Other Ambulatory Visit: Payer: Self-pay | Admitting: Physician Assistant

## 2015-02-11 NOTE — Telephone Encounter (Signed)
Last Filled: 01/12/15 Amt:  90, 0 refill Last OV: 12/21/14 Contract on file UDS: MODERATE risk  Please advise.

## 2015-02-19 ENCOUNTER — Telehealth: Payer: Self-pay | Admitting: Family Medicine

## 2015-02-19 NOTE — Telephone Encounter (Signed)
Caller name: [name] Relation to pt: self Call back number: [phone] / mobile # [phone]   Reason for call:  Patient 64 yr old grand daughter has hand, foot, and mouth disease while in her care (grand daughter was seen in urgent care). Patient states she is currently taking chemo and her immune system is low and patient is inquiring about preventative care. Patient can not come in today for OV due to her having her grandaughter. Patient states is there OTC medication etc. Please advise

## 2015-02-19 NOTE — Telephone Encounter (Signed)
Verbally discussed with Dr.Tabori and the provider's recommendations were as follow: good handwashing and there is nothing preventative to take in terms of medication.  Informed the patient of the above. Also, to be seen right away if she develops any symptoms. Patient understood. No further questions or concerns.

## 2015-02-19 NOTE — Telephone Encounter (Signed)
Patient called back to check the status of below message

## 2015-02-23 ENCOUNTER — Other Ambulatory Visit: Payer: Self-pay | Admitting: Family Medicine

## 2015-02-24 ENCOUNTER — Other Ambulatory Visit: Payer: Self-pay | Admitting: Physician Assistant

## 2015-02-24 NOTE — Telephone Encounter (Signed)
Will defer refills to PCP

## 2015-02-25 ENCOUNTER — Other Ambulatory Visit: Payer: Self-pay | Admitting: Hematology & Oncology

## 2015-02-25 NOTE — Telephone Encounter (Signed)
Faxed hardcopy for Alprazolam to [facility]

## 2015-03-01 ENCOUNTER — Ambulatory Visit: Payer: BLUE CROSS/BLUE SHIELD | Admitting: Cardiovascular Disease

## 2015-03-01 ENCOUNTER — Other Ambulatory Visit: Payer: Self-pay | Admitting: Family Medicine

## 2015-03-01 NOTE — Telephone Encounter (Signed)
OK to refill requested med 

## 2015-03-02 NOTE — Telephone Encounter (Signed)
Printed prescription and on counter for signature. Faxed hardcopy to [facility].

## 2015-03-03 ENCOUNTER — Encounter: Payer: Self-pay | Admitting: *Deleted

## 2015-03-09 ENCOUNTER — Other Ambulatory Visit: Payer: Self-pay | Admitting: Family Medicine

## 2015-03-10 ENCOUNTER — Other Ambulatory Visit: Payer: Self-pay | Admitting: Family Medicine

## 2015-03-10 NOTE — Telephone Encounter (Signed)
Ok to refill requested med, same strength, same sig, same number

## 2015-03-11 NOTE — Telephone Encounter (Signed)
Faxed hardcopy to [facility] [place] 

## 2015-03-15 ENCOUNTER — Encounter: Payer: Self-pay | Admitting: Family Medicine

## 2015-03-15 ENCOUNTER — Ambulatory Visit (INDEPENDENT_AMBULATORY_CARE_PROVIDER_SITE_OTHER): Payer: BLUE CROSS/BLUE SHIELD | Admitting: Family Medicine

## 2015-03-15 VITALS — BP 135/81 | HR 74 | Temp 97.7°F | Ht 63.0 in | Wt 125.2 lb

## 2015-03-15 DIAGNOSIS — E039 Hypothyroidism, unspecified: Secondary | ICD-10-CM | POA: Diagnosis not present

## 2015-03-15 DIAGNOSIS — H6123 Impacted cerumen, bilateral: Secondary | ICD-10-CM

## 2015-03-15 DIAGNOSIS — K5900 Constipation, unspecified: Secondary | ICD-10-CM

## 2015-03-15 DIAGNOSIS — B86 Scabies: Secondary | ICD-10-CM

## 2015-03-15 DIAGNOSIS — R634 Abnormal weight loss: Secondary | ICD-10-CM | POA: Diagnosis not present

## 2015-03-15 DIAGNOSIS — K219 Gastro-esophageal reflux disease without esophagitis: Secondary | ICD-10-CM | POA: Diagnosis not present

## 2015-03-15 MED ORDER — VENLAFAXINE HCL ER 150 MG PO CP24: 150.0000 mg | ORAL_CAPSULE | Freq: Every day | ORAL | Status: DC

## 2015-03-15 MED ORDER — VENLAFAXINE HCL ER 75 MG PO CP24: 75.0000 mg | ORAL_CAPSULE | Freq: Every day | ORAL | Status: DC

## 2015-03-15 MED ORDER — OMEPRAZOLE 40 MG PO CPDR: DELAYED_RELEASE_CAPSULE | ORAL | Status: DC

## 2015-03-15 MED ORDER — ACETAMINOPHEN-CODEINE #3 300-30 MG PO TABS: ORAL_TABLET | ORAL | Status: DC

## 2015-03-15 MED ORDER — PERMETHRIN 5 % EX CREA: 1.0000 "application " | TOPICAL_CREAM | Freq: Once | CUTANEOUS | Status: DC

## 2015-03-15 NOTE — Patient Instructions (Addendum)
Encouraged increased hydration and fiber in diet. Daily probiotics. If bowels not moving can use MilkOfMagnesium 2 tbls po in 4 oz of warm prune juice by mouth every 2-3 days. If no results then repeat in 4 hours with  Dulcolax suppository pr, may repeat again in 4 more hours as needed. Seek care if symptoms worsen. Consider daily Miralax and/or Dulcolax if symptoms persist. Magnesium Citrate is only occasional for constipation  Ask insurance which HIV and Hepatis C tests they will pay for and for what diagnostic code  Cleanse skin with Witch Hazel Astringent for itching  Scabies Scabies are small bugs (mites) that burrow under the skin and cause red bumps and severe itching. These bugs can only be seen with a microscope. Scabies are highly contagious. They can spread easily from person to person by direct contact. They are also spread through sharing clothing or linens that have the scabies mites living in them. It is not unusual for an entire family to become infected through shared towels, clothing, or bedding.  HOME CARE INSTRUCTIONS   Your caregiver may prescribe a cream or lotion to kill the mites. If cream is prescribed, massage the cream into the entire body from the neck to the bottom of both feet. Also massage the cream into the scalp and face if your child is less than 32 year old. Avoid the eyes and mouth. Do not wash your hands after application.  Leave the cream on for 8 to 12 hours. Your child should bathe or shower after the 8 to 12 hour application period. Sometimes it is helpful to apply the cream to your child right before bedtime.  One treatment is usually effective and will eliminate approximately 95% of infestations. For severe cases, your caregiver may decide to repeat the treatment in 1 week. Everyone in your household should be treated with one application of the cream.  New rashes or burrows should not appear within 24 to 48 hours after successful treatment. However, the  itching and rash may last for 2 to 4 weeks after successful treatment. Your caregiver may prescribe a medicine to help with the itching or to help the rash go away more quickly.  Scabies can live on clothing or linens for up to 3 days. All of your child's recently used clothing, towels, stuffed toys, and bed linens should be washed in hot water and then dried in a dryer for at least 20 minutes on high heat. Items that cannot be washed should be enclosed in a plastic bag for at least 3 days.  To help relieve itching, bathe your child in a cool bath or apply cool washcloths to the affected areas.  Your child may return to school after treatment with the prescribed cream. SEEK MEDICAL CARE IF:   The itching persists longer than 4 weeks after treatment.  The rash spreads or becomes infected. Signs of infection include red blisters or yellow-tan crust. Document Released: 07/17/2005 Document Revised: 10/09/2011 Document Reviewed: 11/25/2008 [facility] Patient Information 2015 [place], [place]. This information is not intended to replace advice given to you by your health care provider. Make sure you discuss any questions you have with your health care provider.

## 2015-03-15 NOTE — Progress Notes (Signed)
Pre visit review using our clinic review tool, if applicable. No additional management support is needed unless otherwise documented below in the visit note. 

## 2015-03-19 ENCOUNTER — Ambulatory Visit: Payer: BLUE CROSS/BLUE SHIELD | Admitting: Family Medicine

## 2015-03-28 ENCOUNTER — Encounter: Payer: Self-pay | Admitting: Family Medicine

## 2015-03-28 DIAGNOSIS — B86 Scabies: Secondary | ICD-10-CM

## 2015-03-28 HISTORY — DX: Scabies: B86

## 2015-03-28 NOTE — Assessment & Plan Note (Signed)
Encouraged daily probiotics is doing better with han herbal cleanser

## 2015-03-28 NOTE — Progress Notes (Signed)
Rachael Johnson [id number] 1951/05/09 03/28/2015      Progress Note New Patient  Subjective  Chief Complaint  Chief Complaint  Patient presents with  . Follow-up    HPI  Patient is a 64 year old female in today for routine medical care. In with family with numerous complaints. Her main complaint is itchy rash. Small red bumps which are very itchy are noted. No recent travel. Does have a family member with some similar lesions. No other recent illness. She questions if she has wax in her ears. Notes some decreased hearing. Denies CP/palp/SOB/HA/congestion/fevers/GI or GU c/o. Taking meds as prescribed  Past Medical History  Diagnosis Date  . Arthritis   . Allergy   . Thyroid disease   . Diverticulitis   . Frequent episodic tension-type headache   . Hypertension   . Seizure     childhood  . Anemia, iron deficiency 07/08/2012  . Depression with anxiety 03/24/2011  . Depression   . GERD (gastroesophageal reflux disease)   . Essential thrombocythemia 01/24/2011  . H. pylori infection 12/19/2012  . Other and unspecified hyperlipidemia 02/25/2013  . Preventative health care 04/20/2013  . Cerumen impaction 04/28/2013  . 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 Esophageal reflux 08/17/2013  . Acute pharyngitis 07/13/2014  . Restless leg syndrome 09/30/2014  . Gastroenteritis 12/21/2014  . Cataract 12/21/2014  . Chest pain   . Normal cardiac stress test     Past Surgical History  Procedure Laterality Date  . Breast surgery  1992    biopsy, benign. Fibrocystic  . Abdominal hysterectomy      1992  . Umbilical hernia repair N/A 09/25/2012    Procedure: HERNIA REPAIR UMBILICAL ADULT;  Surgeon: Harl Bowie, MD;  Location: [facility];  Service: General;  Laterality: N/A;    Family History  Problem Relation Age of Onset  . Arthritis Mother   . Cancer Mother     ovarian  . Hypertension Mother   . Heart disease Mother     pacer  . Heart failure Mother   . Arthritis Father   . Cancer Father      lung  . Hypertension Father   . Cancer Sister     lung  . Cancer Brother     prostate  . Cancer Brother     lung  . Hypertension Son   . COPD Brother   . Heart disease Brother   . Hypertension Brother   . Cancer Brother     colon  . Alcohol abuse Brother   . Cirrhosis Brother   . Seizures Sister   . Stroke Sister   . Arthritis Brother     Social History   Social History  . Marital Status: Widowed    Spouse Name: N/A  . Number of Children: 2  . Years of Education: N/A   Occupational History  . APL operator     [employer]   Social History Main Topics  . Smoking status: Former Smoker -- 3.50 packs/day for 20 years    Types: Cigarettes    Start date: 11/15/1970    Quit date: 07/31/1990  . Smokeless tobacco: Never Used     Comment: quit 23 years ago  . Alcohol Use: No  . Drug Use: No  . Sexual Activity: No   Other Topics Concern  . Not on file   Social History Narrative   Regular exercise: no   Caffeine Use: 2-3 weekly    Current Outpatient Prescriptions on File  Prior to Visit  Medication Sig Dispense Refill  . ALPRAZolam (XANAX) 1 MG tablet TAKE ONE TABLET BY MOUTH THREE TIMES A DAY AS NEEDED FOR ANXIETY 90 tablet 0  . aspirin EC 81 MG tablet Take 81 mg by mouth every morning.    . Calcium Carbonate-Vitamin D (CALTRATE COLON HEALTH PO) Take by mouth.    . citalopram (CELEXA) 40 MG tablet TAKE  ONE TABLET BY MOUTH EVERY MORNING 30 tablet 1  . Cranberry 300 MG tablet Take 300 mg by mouth 2 (two) times daily.    Marland Kitchen estradiol (ESTRACE) 0.5 MG tablet TAKE ONE TABLET BY MOUTH TWICE DAILY 180 tablet 2  . fluorometholone (FML) 0.1 % ophthalmic suspension Place 1 drop into both eyes daily as needed (For dry eyes.).     Marland Kitchen folic acid (FOLVITE) 1 MG tablet Take 1 mg by mouth daily.    . furosemide (LASIX) 20 MG tablet TAKE  ONE TABLET BY MOUTH EVERY MORNING 90 tablet 2  . hydroxyurea (HYDREA) 500 MG capsule Take 1 capsule (500 mg total) by mouth 2 (two) times daily.  Take 500mg  daily, alternating with 1000mg  daily. 90 day supply. 180 capsule 3  . levothyroxine (SYNTHROID, LEVOTHROID) 88 MCG tablet TAKE ONE TABLET BY MOUTH ONE   TIME DAILY BEFORE BREAKFAST 30 tablet 6  . loratadine (CLARITIN) 10 MG tablet TAKE 1 TABLET BY MOUTH ONCE A DAY AS NEEDED FOR ALLERGIES OR ITCHING 30 tablet 9  . montelukast (SINGULAIR) 10 MG tablet TAKE ONE TABLET BY MOUTH AT BEDTIME 30 tablet 6  . Multiple Vitamin (MULTIVITAMIN PO) Take 1 tablet by mouth every morning.     . naproxen (NAPROSYN) 500 MG tablet TAKE ONE TABLET BY MOUTH TWICE A DAY WITH MEALS 180 tablet 0  . Omega-3 Fatty Acids (FISH OIL) 1000 MG CAPS Take by mouth 2 (two) times daily.    . ondansetron (ZOFRAN) 8 MG tablet TAKE ONE TABLET BY MOUTH EVERY 8 HOURS AS NEEDED NAUSEA AND VOMITING 20 tablet 1  . pramipexole (MIRAPEX) 0.25 MG tablet Take 1-2 tablets (0.25-0.5 mg total) by mouth at bedtime as needed. 60 tablet 2  . Probiotic Product (PROBIOTIC DAILY PO) Take by mouth 2 (two) times daily.    . ranitidine (ZANTAC) 300 MG tablet TAKE 1 TABLET BY MOUTH AT BEDTIME AS NEEDED FOR HEARTBURN 30 tablet 6  . VENTOLIN HFA 108 (90 BASE) MCG/ACT inhaler INHALE 2 PUFFS INTO THE LUNGS EVERY 6 (SIX) HOURS AS  NEEDED FOR WHEEZI NG OR SHORTNESS OF BREATH. 18 g 8  . Vitamin D, Ergocalciferol, (DRISDOL) 50000 UNITS CAPS capsule TAKE 1 CAPSULE BY MOUTH EVERY OTHER WEEK 6 capsule 0   No current facility-administered medications on file prior to visit.    No Known Allergies  Review of Systems  Review of Systems  Constitutional: Negative for fever and malaise/fatigue.  HENT: Negative for congestion.   Eyes: Negative for discharge.  Respiratory: Negative for shortness of breath.   Cardiovascular: Negative for chest pain, palpitations and leg swelling.  Gastrointestinal: Negative for nausea and abdominal pain.  Genitourinary: Negative for dysuria.  Musculoskeletal: Negative for falls.  Skin: Positive for itching and rash.   Neurological: Negative for loss of consciousness and headaches.  Endo/Heme/Allergies: Negative for environmental allergies.  Psychiatric/Behavioral: Negative for depression. The patient is not nervous/anxious.     Objective  BP 135/81 mmHg  Pulse 74  Temp(Src) 97.7 F (36.5 C) (Oral)  Ht 5\' 3"  (1.6 m)  Wt 125 lb 4 oz (  56.813 kg)  BMI 22.19 kg/m2  SpO2 100%  LMP 07/31/1990  Physical Exam  Physical Exam  Constitutional: She is oriented to person, place, and time and well-developed, well-nourished, and in no distress. No distress.  HENT:  Head: Normocephalic and atraumatic.  Cerumen L.>R occluding on right  Eyes: Conjunctivae are normal.  Neck: Neck supple. No thyromegaly present.  Cardiovascular: Normal rate, regular rhythm and normal heart sounds.   No murmur heard. Pulmonary/Chest: Effort normal and breath sounds normal. She has no wheezes.  Abdominal: She exhibits no distension and no mass.  Musculoskeletal: She exhibits no edema.  Lymphadenopathy:    She has no cervical adenopathy.  Neurological: She is alert and oriented to person, place, and time.  Skin: Skin is warm and dry. Rash noted. She is not diaphoretic. There is erythema.  Erythematous maculopapular lesions diffusely  Psychiatric: Memory, affect and judgment normal.       Assessment & Plan  Hypothyroid On Levothyroxine, continue to monitor  Esophageal reflux Avoid offending foods, start probiotics. Do not eat large meals in late evening and consider raising head of bed.   Scabies Home needs a deep clean and rx for Permethrin  Weight loss Stabilized, she is eating well  Constipation Encouraged daily probiotics is doing better with han herbal cleanser  Cerumen impaction L>R encouraged H2O2 2-3 drops in ears qhs return if no improvement

## 2015-03-28 NOTE — Assessment & Plan Note (Signed)
Home needs a deep clean and rx for Permethrin

## 2015-03-28 NOTE — Assessment & Plan Note (Signed)
Avoid offending foods, start probiotics. Do not eat large meals in late evening and consider raising head of bed.  

## 2015-03-28 NOTE — Assessment & Plan Note (Signed)
Stabilized, she is eating well

## 2015-03-28 NOTE — Assessment & Plan Note (Signed)
On Levothyroxine, continue to monitor 

## 2015-03-28 NOTE — Assessment & Plan Note (Signed)
L>R encouraged H2O2 2-3 drops in ears qhs return if no improvement

## 2015-04-02 ENCOUNTER — Encounter: Payer: BLUE CROSS/BLUE SHIELD | Admitting: Family Medicine

## 2015-04-02 ENCOUNTER — Other Ambulatory Visit (HOSPITAL_BASED_OUTPATIENT_CLINIC_OR_DEPARTMENT_OTHER): Payer: BLUE CROSS/BLUE SHIELD

## 2015-04-02 ENCOUNTER — Encounter: Payer: Self-pay | Admitting: Hematology & Oncology

## 2015-04-02 ENCOUNTER — Ambulatory Visit (HOSPITAL_BASED_OUTPATIENT_CLINIC_OR_DEPARTMENT_OTHER): Payer: BLUE CROSS/BLUE SHIELD

## 2015-04-02 ENCOUNTER — Ambulatory Visit (HOSPITAL_BASED_OUTPATIENT_CLINIC_OR_DEPARTMENT_OTHER): Payer: BLUE CROSS/BLUE SHIELD | Admitting: Hematology & Oncology

## 2015-04-02 VITALS — BP 125/76 | HR 79 | Temp 97.3°F | Resp 18 | Ht 63.0 in | Wt 125.0 lb

## 2015-04-02 DIAGNOSIS — L309 Dermatitis, unspecified: Secondary | ICD-10-CM

## 2015-04-02 DIAGNOSIS — Z23 Encounter for immunization: Secondary | ICD-10-CM | POA: Diagnosis not present

## 2015-04-02 DIAGNOSIS — D509 Iron deficiency anemia, unspecified: Secondary | ICD-10-CM

## 2015-04-02 DIAGNOSIS — D473 Essential (hemorrhagic) thrombocythemia: Secondary | ICD-10-CM

## 2015-04-02 DIAGNOSIS — Z0000 Encounter for general adult medical examination without abnormal findings: Secondary | ICD-10-CM

## 2015-04-02 LAB — CBC WITH DIFFERENTIAL (CANCER CENTER ONLY)
BASO#: 0.1 10*3/uL (ref 0.0–0.2)
BASO%: 1.3 % (ref 0.0–2.0)
EOS%: 7.9 % — ABNORMAL HIGH (ref 0.0–7.0)
Eosinophils Absolute: 0.3 10*3/uL (ref 0.0–0.5)
HCT: 34.9 % (ref 34.8–46.6)
HGB: 11.5 g/dL — ABNORMAL LOW (ref 11.6–15.9)
LYMPH#: 1.3 10*3/uL (ref 0.9–3.3)
LYMPH%: 33.6 % (ref 14.0–48.0)
MCH: 40.4 pg — ABNORMAL HIGH (ref 26.0–34.0)
MCHC: 33 g/dL (ref 32.0–36.0)
MCV: 123 fL — ABNORMAL HIGH (ref 81–101)
MONO#: 0.3 10*3/uL (ref 0.1–0.9)
MONO%: 6.4 % (ref 0.0–13.0)
NEUT#: 2 10*3/uL (ref 1.5–6.5)
NEUT%: 50.8 % (ref 39.6–80.0)
Platelets: 480 10*3/uL — ABNORMAL HIGH (ref 145–400)
RBC: 2.85 10*6/uL — ABNORMAL LOW (ref 3.70–5.32)
RDW: 12.7 % (ref 11.1–15.7)
WBC: 3.9 10*3/uL (ref 3.9–10.0)

## 2015-04-02 LAB — IRON AND TIBC CHCC
%SAT: 30 % (ref 21–57)
Iron: 62 ug/dL (ref 41–142)
TIBC: 209 ug/dL — ABNORMAL LOW (ref 236–444)
UIBC: 147 ug/dL (ref 120–384)

## 2015-04-02 LAB — CHCC SATELLITE - SMEAR

## 2015-04-02 LAB — FERRITIN CHCC: Ferritin: 184 ng/ml (ref 9–269)

## 2015-04-02 MED ORDER — MOMETASONE FUROATE 0.1 % EX OINT: TOPICAL_OINTMENT | Freq: Every day | CUTANEOUS | Status: DC

## 2015-04-02 MED ORDER — INFLUENZA VAC SPLIT QUAD 0.5 ML IM SUSY
0.5000 mL | PREFILLED_SYRINGE | Freq: Once | INTRAMUSCULAR | Status: AC
  Administered 2015-04-02: 0.5 mL via INTRAMUSCULAR
  Filled 2015-04-02: qty 0.5

## 2015-04-02 NOTE — Progress Notes (Signed)
Hematology and Oncology Follow Up Visit  Rachael Johnson [id number] 02/12/1951 64 y.o. 04/02/2015   Principle Diagnosis:  Essential thrombocythemia - JAK2 negative. 2. Intermittent iron-deficiency anemia.  Current Therapy:   Hydrea  1000 mg by mouth daily      Interim History:  Rachael Johnson is back for followup.  She is not feeling all that well. She might be getting a little bit anxious because she is going to go to the beach with her family. She's not been to the beach since her husband passed away.  She also is in some legal issues. With her husband's death, she is the executor of his will. There apparently will be some issues with his kids about the will. She asked me about this. I told her that she needs to have her lawyer talk to their children about the will and the request that her husband made. I think this might get a little "ugly".   She is still working. Her leg swelled quite a bit. She is on her feet quite a bit.   She's not having any problems with the Hydrea.   She does have these skin lesions. She thinks it might be from where she works. I did go ahead and give her a prescription for a steroid cream.   Her iron studies have been okay.   Her appetite is all right. She does not have any nausea or vomiting. She still does not yet although much.  Overall, her performance status is ECOG 2.  Medications:  Current outpatient prescriptions:  .  acetaminophen-codeine (TYLENOL #3) 300-30 MG per tablet, TAKE 1 TABLET BY MOUTH EVERY 6 HOURS AS NEEDED FOR MODERATE TO SEVERE PAIN, Disp: 90 tablet, Rfl: 2 .  ALPRAZolam (XANAX) 1 MG tablet, TAKE ONE TABLET BY MOUTH THREE TIMES A DAY AS NEEDED FOR ANXIETY, Disp: 90 tablet, Rfl: 0 .  aspirin EC 81 MG tablet, Take 81 mg by mouth every morning., Disp: , Rfl:  .  Calcium Carbonate-Vitamin D (CALTRATE COLON HEALTH PO), Take by mouth., Disp: , Rfl:  .  citalopram (CELEXA) 40 MG tablet, TAKE  ONE TABLET BY MOUTH EVERY MORNING, Disp: 30  tablet, Rfl: 1 .  Cranberry 300 MG tablet, Take 300 mg by mouth 2 (two) times daily., Disp: , Rfl:  .  estradiol (ESTRACE) 0.5 MG tablet, TAKE ONE TABLET BY MOUTH TWICE DAILY, Disp: 180 tablet, Rfl: 2 .  fluorometholone (FML) 0.1 % ophthalmic suspension, Place 1 drop into both eyes daily as needed (For dry eyes.). , Disp: , Rfl:  .  folic acid (FOLVITE) 1 MG tablet, Take 1 mg by mouth daily., Disp: , Rfl:  .  furosemide (LASIX) 20 MG tablet, TAKE  ONE TABLET BY MOUTH EVERY MORNING, Disp: 90 tablet, Rfl: 2 .  hydroxyurea (HYDREA) 500 MG capsule, Take 1 capsule (500 mg total) by mouth 2 (two) times daily. Take 500mg  daily, alternating with 1000mg  daily. 90 day supply., Disp: 180 capsule, Rfl: 3 .  levothyroxine (SYNTHROID, LEVOTHROID) 88 MCG tablet, TAKE ONE TABLET BY MOUTH ONE   TIME DAILY BEFORE BREAKFAST, Disp: 30 tablet, Rfl: 6 .  loratadine (CLARITIN) 10 MG tablet, TAKE 1 TABLET BY MOUTH ONCE A DAY AS NEEDED FOR ALLERGIES OR ITCHING, Disp: 30 tablet, Rfl: 9 .  montelukast (SINGULAIR) 10 MG tablet, TAKE ONE TABLET BY MOUTH AT BEDTIME, Disp: 30 tablet, Rfl: 6 .  Multiple Vitamin (MULTIVITAMIN PO), Take 1 tablet by mouth every morning. , Disp: , Rfl:  .  naproxen (NAPROSYN) 500 MG tablet, TAKE ONE TABLET BY MOUTH TWICE A DAY WITH MEALS, Disp: 180 tablet, Rfl: 0 .  Omega-3 Fatty Acids (FISH OIL) 1000 MG CAPS, Take by mouth 2 (two) times daily., Disp: , Rfl:  .  omeprazole (PRILOSEC) 40 MG capsule, TAKE 1 CAPSULE (40 MG TOTAL)     BY MOUTH 2 (TWO) TIMES DAILY., Disp: 60 capsule, Rfl: 6 .  ondansetron (ZOFRAN) 8 MG tablet, TAKE ONE TABLET BY MOUTH EVERY 8 HOURS AS NEEDED NAUSEA AND VOMITING, Disp: 20 tablet, Rfl: 1 .  permethrin (ACTICIN) 5 % cream, Apply 1 application topically once. Apply to skin head to toe at bedtime and wash off in am, may repeat in 2 weeks once if new lesions continue to develop, Disp: 60 g, Rfl: 1 .  pramipexole (MIRAPEX) 0.25 MG tablet, Take 1-2 tablets (0.25-0.5 mg total) by  mouth at bedtime as needed., Disp: 60 tablet, Rfl: 2 .  Probiotic Product (PROBIOTIC DAILY PO), Take by mouth 2 (two) times daily., Disp: , Rfl:  .  ranitidine (ZANTAC) 300 MG tablet, TAKE 1 TABLET BY MOUTH AT BEDTIME AS NEEDED FOR HEARTBURN, Disp: 30 tablet, Rfl: 6 .  venlafaxine XR (EFFEXOR-XR) 150 MG 24 hr capsule, Take 1 capsule (150 mg total) by mouth daily with breakfast., Disp: 90 capsule, Rfl: 0 .  venlafaxine XR (EFFEXOR-XR) 75 MG 24 hr capsule, Take 1 capsule (75 mg total) by mouth daily with breakfast., Disp: 90 capsule, Rfl: 0 .  VENTOLIN HFA 108 (90 BASE) MCG/ACT inhaler, INHALE 2 PUFFS INTO THE LUNGS EVERY 6 (SIX) HOURS AS  NEEDED FOR WHEEZI NG OR SHORTNESS OF BREATH., Disp: 18 g, Rfl: 8 .  Vitamin D, Ergocalciferol, (DRISDOL) 50000 UNITS CAPS capsule, TAKE 1 CAPSULE BY MOUTH EVERY OTHER WEEK, Disp: 6 capsule, Rfl: 0 .  mometasone (ELOCON) 0.1 % ointment, Apply topically daily., Disp: 45 g, Rfl: 0  Allergies: No Known Allergies  Past Medical History, Surgical history, Social history, and Family History were reviewed and updated.  Review of Systems: As above  Physical Exam:  height is 5\' 3"  (1.6 m) and weight is 125 lb (56.7 kg). Her oral temperature is 97.3 F (36.3 C). Her blood pressure is 125/76 and her pulse is 79. Her respiration is 18.   Thin white female. Head and neck exam shows no ocular or oral lesions. She has some slight temporal muscle wasting. There is no adenopathy in her neck. Lungs are clear. Cardiac exam regular rate and rhythm. No murmurs. Abdomen is soft. She has good bowel sounds. There is no fluid wave. There is no palpable liver or spleen. Back exam shows no tenderness over the spine ribs or hips. Extremities shows mild chronic nonpitting edema. she has good range of motion of her joints. She has a compression sleeve on her right arm. There is no swelling of the right arm. There is no cyanosis of her lower extremities. She has good pulses. . Skin exam no  rashes. Neurological exam no focal deficits.  Lab Results  Component Value Date   WBC 3.9 04/02/2015   HGB 11.5* 04/02/2015   HCT 34.9 04/02/2015   MCV 123* 04/02/2015   PLT 480* 04/02/2015     Chemistry      Component Value Date/Time   NA 138 12/21/2014 0844   NA 139 09/10/2014 0743   K 3.8 12/21/2014 0844   K 3.7 09/10/2014 0743   CL 101 12/21/2014 0844   CL 97* 09/10/2014 0743  CO2 32 12/21/2014 0844   CO2 28 09/10/2014 0743   BUN 14 12/21/2014 0844   BUN 18 09/10/2014 0743   CREATININE 0.66 12/21/2014 0844   CREATININE 0.6 09/10/2014 0743      Component Value Date/Time   CALCIUM 9.0 12/21/2014 0844   CALCIUM 8.8 09/10/2014 0743   ALKPHOS 52 12/21/2014 0844   ALKPHOS 46 09/10/2014 0743   AST 20 12/21/2014 0844   AST 22 09/10/2014 0743   ALT 12 12/21/2014 0844   ALT 12 09/10/2014 0743   BILITOT 0.3 12/21/2014 0844   BILITOT 0.50 09/10/2014 0743         Impression and Plan: Rachael Johnson is 64 year old white female with essential thrombocythemia. Her platelet count is  Is higher. As such, I think we have to get her back on the 1000 mg dose of Hydrea.   I don't see that we have to put her on anything else right now.   it is hard to tell if her spleen is enlarged. I think she needs to have an ultrasound of her abdomen. I will set that up for her next week.    I looked at her blood smear. I did not see that looked suspicious for any type of transformation. However, this is some that we will have to be cognizant of. I will wtch very carefully regarding this.   I will like to see her back in 6-8 weeks.eight will go up we see her back.    [name], MD 9/2/201610:44 AM

## 2015-04-02 NOTE — Patient Instructions (Signed)

## 2015-04-02 NOTE — Progress Notes (Unsigned)
Late entry for 03-30-15  Patient in for Ear irrigation per orders from Dr. Penni Homans. Both ears irrigated,patient tolerated well.

## 2015-04-03 ENCOUNTER — Other Ambulatory Visit: Payer: Self-pay | Admitting: Family Medicine

## 2015-04-08 ENCOUNTER — Ambulatory Visit (HOSPITAL_BASED_OUTPATIENT_CLINIC_OR_DEPARTMENT_OTHER)
Admission: RE | Admit: 2015-04-08 | Discharge: 2015-04-08 | Disposition: A | Discharge status: Home / Self Care | Payer: BLUE CROSS/BLUE SHIELD | Source: Ambulatory Visit | Attending: Hematology & Oncology | Admitting: Hematology & Oncology

## 2015-04-08 ENCOUNTER — Other Ambulatory Visit: Payer: Self-pay | Admitting: Hematology & Oncology

## 2015-04-08 DIAGNOSIS — D473 Essential (hemorrhagic) thrombocythemia: Secondary | ICD-10-CM

## 2015-04-08 DIAGNOSIS — L309 Dermatitis, unspecified: Secondary | ICD-10-CM

## 2015-04-08 DIAGNOSIS — D696 Thrombocytopenia, unspecified: Secondary | ICD-10-CM | POA: Insufficient documentation

## 2015-04-08 DIAGNOSIS — Z0000 Encounter for general adult medical examination without abnormal findings: Secondary | ICD-10-CM

## 2015-04-08 DIAGNOSIS — R161 Splenomegaly, not elsewhere classified: Secondary | ICD-10-CM | POA: Diagnosis not present

## 2015-04-09 ENCOUNTER — Telehealth: Payer: Self-pay | Admitting: *Deleted

## 2015-04-09 NOTE — Telephone Encounter (Addendum)
Message left on home voice mail  ----- Message from [name], MD sent at 04/08/2015  6:18 PM EDT ----- Please call and tell her that the spleen is normal in size. Thanks

## 2015-04-11 ENCOUNTER — Other Ambulatory Visit: Payer: Self-pay | Admitting: Family Medicine

## 2015-05-05 ENCOUNTER — Other Ambulatory Visit: Payer: Self-pay | Admitting: Family Medicine

## 2015-05-07 ENCOUNTER — Other Ambulatory Visit: Payer: Self-pay | Admitting: Family Medicine

## 2015-05-10 NOTE — Telephone Encounter (Signed)
Faxed hardcopy for alprazolam to [facility].

## 2015-05-21 ENCOUNTER — Other Ambulatory Visit: Payer: Self-pay | Admitting: Hematology & Oncology

## 2015-06-02 ENCOUNTER — Ambulatory Visit (HOSPITAL_BASED_OUTPATIENT_CLINIC_OR_DEPARTMENT_OTHER): Payer: BLUE CROSS/BLUE SHIELD | Admitting: Hematology & Oncology

## 2015-06-02 ENCOUNTER — Other Ambulatory Visit (HOSPITAL_BASED_OUTPATIENT_CLINIC_OR_DEPARTMENT_OTHER): Payer: BLUE CROSS/BLUE SHIELD

## 2015-06-02 ENCOUNTER — Encounter: Payer: Self-pay | Admitting: Hematology & Oncology

## 2015-06-02 VITALS — BP 117/59 | HR 71 | Temp 97.3°F | Resp 18 | Ht 63.0 in | Wt 119.0 lb

## 2015-06-02 DIAGNOSIS — L309 Dermatitis, unspecified: Secondary | ICD-10-CM

## 2015-06-02 DIAGNOSIS — D473 Essential (hemorrhagic) thrombocythemia: Secondary | ICD-10-CM

## 2015-06-02 DIAGNOSIS — Z0000 Encounter for general adult medical examination without abnormal findings: Secondary | ICD-10-CM

## 2015-06-02 DIAGNOSIS — K5901 Slow transit constipation: Secondary | ICD-10-CM

## 2015-06-02 LAB — CBC WITH DIFFERENTIAL (CANCER CENTER ONLY)
BASO#: 0 10*3/uL (ref 0.0–0.2)
BASO%: 0.9 % (ref 0.0–2.0)
EOS%: 3.9 % (ref 0.0–7.0)
Eosinophils Absolute: 0.1 10*3/uL (ref 0.0–0.5)
HCT: 34.9 % (ref 34.8–46.6)
HGB: 11.6 g/dL (ref 11.6–15.9)
LYMPH#: 1.2 10*3/uL (ref 0.9–3.3)
LYMPH%: 36.9 % (ref 14.0–48.0)
MCH: 41.4 pg — ABNORMAL HIGH (ref 26.0–34.0)
MCHC: 33.2 g/dL (ref 32.0–36.0)
MCV: 125 fL — ABNORMAL HIGH (ref 81–101)
MONO#: 0.3 10*3/uL (ref 0.1–0.9)
MONO%: 7.7 % (ref 0.0–13.0)
NEUT#: 1.7 10*3/uL (ref 1.5–6.5)
NEUT%: 50.6 % (ref 39.6–80.0)
Platelets: 457 10*3/uL — ABNORMAL HIGH (ref 145–400)
RBC: 2.8 10*6/uL — ABNORMAL LOW (ref 3.70–5.32)
RDW: 13.8 % (ref 11.1–15.7)
WBC: 3.4 10*3/uL — ABNORMAL LOW (ref 3.9–10.0)

## 2015-06-02 LAB — COMPREHENSIVE METABOLIC PANEL (CC13)
ALT: 18 U/L (ref 0–55)
AST: 20 U/L (ref 5–34)
Albumin: 3.9 g/dL (ref 3.5–5.0)
Alkaline Phosphatase: 55 U/L (ref 40–150)
Anion Gap: 6 mEq/L (ref 3–11)
BUN: 16.6 mg/dL (ref 7.0–26.0)
CO2: 29 mEq/L (ref 22–29)
Calcium: 9 mg/dL (ref 8.4–10.4)
Chloride: 105 mEq/L (ref 98–109)
Creatinine: 0.7 mg/dL (ref 0.6–1.1)
EGFR: >90 mL/min/{1.73_m2} (ref >90)
Glucose: 101 mg/dl (ref 70–140)
Potassium: 3.8 mEq/L (ref 3.5–5.1)
Sodium: 139 mEq/L (ref 136–145)
Total Bilirubin: 0.3 mg/dL (ref 0.20–1.20)
Total Protein: 6.6 g/dL (ref 6.4–8.3)

## 2015-06-02 LAB — LACTATE DEHYDROGENASE: LDH: 196 U/L (ref 94–250)

## 2015-06-02 LAB — RETICULOCYTES (CHCC)
ABS Retic: 45.8 10*3/uL (ref 19.0–186.0)
RBC.: 2.86 MIL/uL — ABNORMAL LOW (ref 3.87–5.11)
Retic Ct Pct: 1.6 % (ref 0.4–2.3)

## 2015-06-02 LAB — IRON AND TIBC CHCC
%SAT: 30 % (ref 21–57)
Iron: 63 ug/dL (ref 41–142)
TIBC: 206 ug/dL — ABNORMAL LOW (ref 236–444)
UIBC: 143 ug/dL (ref 120–384)

## 2015-06-02 LAB — CHCC SATELLITE - SMEAR

## 2015-06-02 LAB — FERRITIN CHCC: Ferritin: 180 ng/ml (ref 9–269)

## 2015-06-02 MED ORDER — LACTULOSE 10 GM/15ML PO SOLN: ORAL | Status: DC

## 2015-06-02 NOTE — Progress Notes (Signed)
Hematology and Oncology Follow Up Visit  Rachael Johnson [id number] [birth year] 64 y.o. 06/02/2015   Principle Diagnosis:  Essential thrombocythemia - JAK2 negative. 2. Intermittent iron-deficiency anemia.  Current Therapy:   Hydrea  1000 mg by mouth daily      Interim History:  [name] is back for followup.  She is not feeling all that well. She is having a lot of arthralgias and myalgias. She has been seeing other doctors. His elbow she is going to get some type of injections to help with the pain. Per she will retire from her job in December. She really has a lot of knee problems. We will give her a handicapped parking sticker so she will not suffer as much.  She is also constipated. I'll try her on some lactulose.  She's had no bleeding.  She's had no fever.  She's had no leg swelling.  She's had no cough or shortness of breath.  Apparently she is going to be moving to a new place. Where she is now, it is just too difficult for her.  Overall, her performance status is ECOG 2.  Medications:  Current outpatient prescriptions:  .  acetaminophen-codeine (TYLENOL #3) 300-30 MG per tablet, TAKE 1 TABLET BY MOUTH EVERY 6 HOURS AS NEEDED FOR MODERATE TO SEVERE PAIN, Disp: 90 tablet, Rfl: 0 .  ALPRAZolam (XANAX) 1 MG tablet, TAKE ONE TABLET BY MOUTH THREE TIMES A DAY AS NEEDED FOR ANXIETY, Disp: 90 tablet, Rfl: 0 .  aspirin EC 81 MG tablet, Take 81 mg by mouth every morning., Disp: , Rfl:  .  Calcium Carbonate-Vitamin D (CALTRATE COLON HEALTH PO), Take by mouth., Disp: , Rfl:  .  citalopram (CELEXA) 40 MG tablet, TAKE  ONE TABLET BY MOUTH EVERY MORNING, Disp: 30 tablet, Rfl: 0 .  Cranberry 300 MG tablet, Take 300 mg by mouth 2 (two) times daily., Disp: , Rfl:  .  diazepam (VALIUM) 10 MG tablet, , Disp: , Rfl: 0 .  estradiol (ESTRACE) 0.5 MG tablet, TAKE ONE TABLET BY MOUTH TWICE DAILY, Disp: 180 tablet, Rfl: 2 .  fluorometholone (FML) 0.1 % ophthalmic suspension, Place 1 drop into  both eyes daily as needed (For dry eyes.). , Disp: , Rfl:  .  folic acid (FOLVITE) 1 MG tablet, Take 1 mg by mouth daily., Disp: , Rfl:  .  furosemide (LASIX) 20 MG tablet, TAKE  ONE TABLET BY MOUTH EVERY MORNING, Disp: 90 tablet, Rfl: 2 .  hydroxyurea (HYDREA) 500 MG capsule, Take 1 capsule (500 mg total) by mouth 2 (two) times daily. Take 500mg  daily, alternating with 1000mg  daily. 90 day supply., Disp: 180 capsule, Rfl: 3 .  lactulose (CHRONULAC) 10 GM/15ML solution, Please take 2 tablespoons 3 times a day for constipation., Disp: 1000 mL, Rfl: 8 .  levothyroxine (SYNTHROID, LEVOTHROID) 88 MCG tablet, TAKE ONE TABLET BY MOUTH ONE   TIME DAILY BEFORE BREAKFAST, Disp: 30 tablet, Rfl: 6 .  loratadine (CLARITIN) 10 MG tablet, TAKE 1 TABLET BY MOUTH ONCE A DAY AS NEEDED FOR ALLERGIES OR ITCHING, Disp: 30 tablet, Rfl: 9 .  mometasone (ELOCON) 0.1 % ointment, Apply topically daily., Disp: 45 g, Rfl: 0 .  montelukast (SINGULAIR) 10 MG tablet, TAKE ONE TABLET BY MOUTH AT BEDTIME, Disp: 30 tablet, Rfl: 6 .  Multiple Vitamin (MULTIVITAMIN PO), Take 1 tablet by mouth every morning. , Disp: , Rfl:  .  naproxen (NAPROSYN) 500 MG tablet, TAKE ONE TABLET BY MOUTH TWICE A DAY WITH MEALS, Disp:  180 tablet, Rfl: 0 .  Omega-3 Fatty Acids (FISH OIL) 1000 MG CAPS, Take by mouth 2 (two) times daily., Disp: , Rfl:  .  omeprazole (PRILOSEC) 40 MG capsule, TAKE 1 CAPSULE (40 MG TOTAL)     BY MOUTH 2 (TWO) TIMES DAILY., Disp: 60 capsule, Rfl: 6 .  ondansetron (ZOFRAN) 8 MG tablet, TAKE ONE TABLET BY MOUTH EVERY 8 HOURS AS NEEDED NAUSEA AND VOMITING, Disp: 20 tablet, Rfl: 1 .  permethrin (ACTICIN) 5 % cream, Apply 1 application topically once. Apply to skin head to toe at bedtime and wash off in am, may repeat in 2 weeks once if new lesions continue to develop, Disp: 60 g, Rfl: 1 .  pramipexole (MIRAPEX) 0.25 MG tablet, TAKE ONE OR TWO TABLETS BY MOUTH AT BEDTIME AS NEEDED, Disp: 60 tablet, Rfl: 1 .  Probiotic Product  (PROBIOTIC DAILY PO), Take by mouth 2 (two) times daily., Disp: , Rfl:  .  ranitidine (ZANTAC) 300 MG tablet, TAKE 1 TABLET BY MOUTH AT BEDTIME AS NEEDED FOR HEARTBURN, Disp: 30 tablet, Rfl: 5 .  venlafaxine XR (EFFEXOR-XR) 150 MG 24 hr capsule, Take 1 capsule (150 mg total) by mouth daily with breakfast., Disp: 90 capsule, Rfl: 0 .  venlafaxine XR (EFFEXOR-XR) 75 MG 24 hr capsule, Take 1 capsule (75 mg total) by mouth daily with breakfast., Disp: 90 capsule, Rfl: 0 .  VENTOLIN HFA 108 (90 BASE) MCG/ACT inhaler, INHALE 2 PUFFS INTO THE LUNGS EVERY 6 (SIX) HOURS AS  NEEDED FOR WHEEZI NG OR SHORTNESS OF BREATH., Disp: 18 g, Rfl: 8 .  Vitamin D, Ergocalciferol, (DRISDOL) 50000 UNITS CAPS capsule, TAKE 1 CAPSULE BY MOUTH EVERY OTHER WEEK, Disp: 6 capsule, Rfl: 0  Allergies: No Known Allergies  Past Medical History, Surgical history, Social history, and Family History were reviewed and updated.  Review of Systems: As above  Physical Exam:  height is 5\' 3"  (1.6 m) and weight is 119 lb (53.978 kg). Her oral temperature is 97.3 F (36.3 C). Her blood pressure is 117/59 and her pulse is 71. Her respiration is 18.   Thin white female. Head and neck exam shows no ocular or oral lesions. She has some slight temporal muscle wasting. There is no adenopathy in her neck. Lungs are clear. Cardiac exam regular rate and rhythm. No murmurs. Abdomen is soft. She has good bowel sounds. There is no fluid wave. There is no palpable liver or spleen. Back exam shows no tenderness over the spine ribs or hips. Extremities shows mild chronic nonpitting edema. she has good range of motion of her joints. She has a compression sleeve on her right arm. There is no swelling of the right arm. There is no cyanosis of her lower extremities. She has good pulses. . Skin exam no rashes. Neurological exam no focal deficits.  Lab Results  Component Value Date   WBC 3.4* 06/02/2015   HGB 11.6 06/02/2015   HCT 34.9 06/02/2015   MCV  125* 06/02/2015   PLT 457* 06/02/2015     Chemistry      Component Value Date/Time   NA 138 12/21/2014 0844   NA 139 09/10/2014 0743   K 3.8 12/21/2014 0844   K 3.7 09/10/2014 0743   CL 101 12/21/2014 0844   CL 97* 09/10/2014 0743   CO2 32 12/21/2014 0844   CO2 28 09/10/2014 0743   BUN 14 12/21/2014 0844   BUN 18 09/10/2014 0743   CREATININE 0.66 12/21/2014 0844   CREATININE 0.6  09/10/2014 0743      Component Value Date/Time   CALCIUM 9.0 12/21/2014 0844   CALCIUM 8.8 09/10/2014 0743   ALKPHOS 52 12/21/2014 0844   ALKPHOS 46 09/10/2014 0743   AST 20 12/21/2014 0844   AST 22 09/10/2014 0743   ALT 12 12/21/2014 0844   ALT 12 09/10/2014 0743   BILITOT 0.3 12/21/2014 0844   BILITOT 0.50 09/10/2014 0743         Impression and Plan: Ms. Olivero is 64 year old white female with essential thrombocythemia. Her platelet count is holding steady. As such, we will not adjust the Hydrea dose.  I wean get her through the holidays.  I'll plan to get her back in 3 months.     [name], MD 11/2/20169:07 AM

## 2015-06-03 ENCOUNTER — Other Ambulatory Visit: Payer: Self-pay | Admitting: Family Medicine

## 2015-06-03 MED ORDER — CITALOPRAM HYDROBROMIDE 40 MG PO TABS: ORAL_TABLET | ORAL | Status: DC

## 2015-06-17 ENCOUNTER — Ambulatory Visit (INDEPENDENT_AMBULATORY_CARE_PROVIDER_SITE_OTHER): Payer: BLUE CROSS/BLUE SHIELD | Admitting: Family Medicine

## 2015-06-17 ENCOUNTER — Encounter: Payer: Self-pay | Admitting: Family Medicine

## 2015-06-17 VITALS — BP 114/72 | HR 82 | Temp 98.2°F | Ht 63.0 in | Wt 119.0 lb

## 2015-06-17 DIAGNOSIS — N390 Urinary tract infection, site not specified: Secondary | ICD-10-CM | POA: Diagnosis not present

## 2015-06-17 DIAGNOSIS — F418 Other specified anxiety disorders: Secondary | ICD-10-CM

## 2015-06-17 DIAGNOSIS — E039 Hypothyroidism, unspecified: Secondary | ICD-10-CM | POA: Diagnosis not present

## 2015-06-17 DIAGNOSIS — E782 Mixed hyperlipidemia: Secondary | ICD-10-CM

## 2015-06-17 DIAGNOSIS — D649 Anemia, unspecified: Secondary | ICD-10-CM | POA: Diagnosis not present

## 2015-06-17 DIAGNOSIS — J209 Acute bronchitis, unspecified: Secondary | ICD-10-CM

## 2015-06-17 DIAGNOSIS — B86 Scabies: Secondary | ICD-10-CM

## 2015-06-17 DIAGNOSIS — D539 Nutritional anemia, unspecified: Secondary | ICD-10-CM

## 2015-06-17 DIAGNOSIS — E785 Hyperlipidemia, unspecified: Secondary | ICD-10-CM

## 2015-06-17 MED ORDER — AMOXICILLIN 500 MG PO CAPS: 500.0000 mg | ORAL_CAPSULE | Freq: Three times a day (TID) | ORAL | Status: DC

## 2015-06-17 MED ORDER — ALPRAZOLAM 1 MG PO TABS: 1.0000 mg | ORAL_TABLET | Freq: Three times a day (TID) | ORAL | Status: DC | PRN

## 2015-06-17 MED ORDER — NAPROXEN 500 MG PO TABS: 500.0000 mg | ORAL_TABLET | Freq: Two times a day (BID) | ORAL | Status: DC

## 2015-06-17 MED ORDER — ACETAMINOPHEN-CODEINE #3 300-30 MG PO TABS: ORAL_TABLET | ORAL | Status: DC

## 2015-06-17 NOTE — Progress Notes (Signed)
Rachael Johnson [id number] March 25, 1951 06/17/2015      Patient Progress Note   Subjective  Chief Complaint  Chief Complaint  Patient presents with  . Follow-up    HPI  64 year old female presents for routine follow up care. She has been experiencing persistent cough, phlegm, rhinorrea, headache for 24 hours. Cough has been half dry, half dark productive. Mylagias, but has always had. No vomiting or diarrhea. Has gotten much worse this morning. Afebrile, denies chills. She had three pain block injections in her shoulder and one in her spine by Dr Ernestina Patches (pain specialist) but says they have not done much for her. She is returning to see Dr. Marlou Sa (orhtopedic) for further treatment. Her constipation has improved with lactulose. Rash has decreased, still residual scars. Her appetite has been okay, nausea manageable with zofran. Patient denies shortness of breath, chest pain,changes in urination, GI issues, recent fevers or illnesses    Past Medical History  Diagnosis Date  . Arthritis   . Allergy   . Thyroid disease   . Diverticulitis   . Frequent episodic tension-type headache   . Hypertension   . Seizure ([place])     childhood  . Anemia, iron deficiency 07/08/2012  . Depression with anxiety 03/24/2011  . Depression   . GERD (gastroesophageal reflux disease)   . Essential thrombocythemia ([place]) 01/24/2011  . H. pylori infection 12/19/2012  . Other and unspecified hyperlipidemia 02/25/2013  . Preventative health care 04/20/2013  . Cerumen impaction 04/28/2013  . 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 Esophageal reflux 08/17/2013  . Acute pharyngitis 07/13/2014  . Restless leg syndrome 09/30/2014  . Gastroenteritis 12/21/2014  . Cataract 12/21/2014  . Chest pain   . Normal cardiac stress test   . Scabies 03/28/2015    Past Surgical History  Procedure Laterality Date  . Breast surgery  1992    biopsy, benign. Fibrocystic  . Abdominal hysterectomy      1992  . Umbilical hernia repair N/A 09/25/2012     Procedure: HERNIA REPAIR UMBILICAL ADULT;  Surgeon: Harl Bowie, MD;  Location: [facility];  Service: General;  Laterality: N/A;    Family History  Problem Relation Age of Onset  . Arthritis Mother   . Cancer Mother     ovarian  . Hypertension Mother   . Heart disease Mother     pacer  . Heart failure Mother   . Arthritis Father   . Cancer Father     lung  . Hypertension Father   . Cancer Sister     lung  . Cancer Brother     prostate  . Cancer Brother     lung  . Hypertension Son   . COPD Brother   . Heart disease Brother   . Hypertension Brother   . Cancer Brother     colon  . Alcohol abuse Brother   . Cirrhosis Brother   . Seizures Sister   . Stroke Sister   . Arthritis Brother     Social History   Social History  . Marital Status: Widowed    Spouse Name: N/A  . Number of Children: 2  . Years of Education: N/A   Occupational History  . APL operator     [employer]   Social History Main Topics  . Smoking status: Former Smoker -- 3.50 packs/day for 20 years    Types: Cigarettes    Start date: 11/15/1970    Quit date: 07/31/1990  . Smokeless tobacco: Never  Used     Comment: quit 23 years ago  . Alcohol Use: No  . Drug Use: No  . Sexual Activity: No   Other Topics Concern  . Not on file   Social History Narrative   Regular exercise: no   Caffeine Use: 2-3 weekly    Current Outpatient Prescriptions on File Prior to Visit  Medication Sig Dispense Refill  . acetaminophen-codeine (TYLENOL #3) 300-30 MG per tablet TAKE 1 TABLET BY MOUTH EVERY 6 HOURS AS NEEDED FOR MODERATE TO SEVERE PAIN 90 tablet 0  . ALPRAZolam (XANAX) 1 MG tablet TAKE ONE TABLET BY MOUTH THREE TIMES A DAY AS NEEDED FOR ANXIETY 90 tablet 0  . aspirin EC 81 MG tablet Take 81 mg by mouth every morning.    . Calcium Carbonate-Vitamin D (CALTRATE COLON HEALTH PO) Take by mouth.    . citalopram (CELEXA) 40 MG tablet TAKE  ONE TABLET BY MOUTH EVERY MORNING 30 tablet 3  . Cranberry  300 MG tablet Take 300 mg by mouth 2 (two) times daily.    . diazepam (VALIUM) 10 MG tablet   0  . estradiol (ESTRACE) 0.5 MG tablet TAKE ONE TABLET BY MOUTH TWICE DAILY 180 tablet 2  . fluorometholone (FML) 0.1 % ophthalmic suspension Place 1 drop into both eyes daily as needed (For dry eyes.).     Marland Kitchen folic acid (FOLVITE) 1 MG tablet Take 1 mg by mouth daily.    . furosemide (LASIX) 20 MG tablet TAKE  ONE TABLET BY MOUTH EVERY MORNING 90 tablet 2  . hydroxyurea (HYDREA) 500 MG capsule Take 1 capsule (500 mg total) by mouth 2 (two) times daily. Take 500mg  daily, alternating with 1000mg  daily. 90 day supply. 180 capsule 3  . lactulose (CHRONULAC) 10 GM/15ML solution Please take 2 tablespoons 3 times a day for constipation. 1000 mL 8  . levothyroxine (SYNTHROID, LEVOTHROID) 88 MCG tablet TAKE ONE TABLET BY MOUTH ONE   TIME DAILY BEFORE BREAKFAST 30 tablet 6  . loratadine (CLARITIN) 10 MG tablet TAKE 1 TABLET BY MOUTH ONCE A DAY AS NEEDED FOR ALLERGIES OR ITCHING 30 tablet 9  . mometasone (ELOCON) 0.1 % ointment Apply topically daily. 45 g 0  . montelukast (SINGULAIR) 10 MG tablet TAKE ONE TABLET BY MOUTH AT BEDTIME 30 tablet 5  . Multiple Vitamin (MULTIVITAMIN PO) Take 1 tablet by mouth every morning.     . naproxen (NAPROSYN) 500 MG tablet TAKE ONE TABLET BY MOUTH TWICE A DAY WITH MEALS 180 tablet 0  . Omega-3 Fatty Acids (FISH OIL) 1000 MG CAPS Take by mouth 2 (two) times daily.    Marland Kitchen omeprazole (PRILOSEC) 40 MG capsule TAKE 1 CAPSULE (40 MG TOTAL)     BY MOUTH 2 (TWO) TIMES DAILY. 60 capsule 6  . ondansetron (ZOFRAN) 8 MG tablet TAKE ONE TABLET BY MOUTH EVERY 8 HOURS AS NEEDED NAUSEA AND VOMITING 20 tablet 1  . permethrin (ACTICIN) 5 % cream Apply 1 application topically once. Apply to skin head to toe at bedtime and wash off in am, may repeat in 2 weeks once if new lesions continue to develop 60 g 1  . pramipexole (MIRAPEX) 0.25 MG tablet TAKE ONE OR TWO TABLETS BY MOUTH AT BEDTIME AS NEEDED 60  tablet 1  . Probiotic Product (PROBIOTIC DAILY PO) Take by mouth 2 (two) times daily.    . ranitidine (ZANTAC) 300 MG tablet TAKE 1 TABLET BY MOUTH AT BEDTIME AS NEEDED FOR HEARTBURN 30 tablet 5  .  venlafaxine XR (EFFEXOR-XR) 150 MG 24 hr capsule Take 1 capsule (150 mg total) by mouth daily with breakfast. 90 capsule 0  . venlafaxine XR (EFFEXOR-XR) 75 MG 24 hr capsule Take 1 capsule (75 mg total) by mouth daily with breakfast. 90 capsule 0  . VENTOLIN HFA 108 (90 BASE) MCG/ACT inhaler INHALE 2 PUFFS INTO THE LUNGS EVERY 6 (SIX) HOURS AS  NEEDED FOR WHEEZI NG OR SHORTNESS OF BREATH. 18 g 8  . Vitamin D, Ergocalciferol, (DRISDOL) 50000 UNITS CAPS capsule TAKE 1 CAPSULE BY MOUTH EVERY OTHER WEEK 6 capsule 0   No current facility-administered medications on file prior to visit.    No Known Allergies  Review of Systems   Constitutional: Negative for fever and malaise/fatigue.  HENT: Positive for congestion, rhinorrhea  Eyes: Negative for discharge.  Respiratory: Negative for shortness of breath.  Cardiovascular: Negative for chest pain, palpitations and leg swelling.  Gastrointestinal: Negative for nausea, abdominal pain and diarrhea. Positive for constipation Genitourinary: Negative for dysuria and urgency, hematuria and flank pain.  Musculoskeletal: Negative for myalgias and falls.  Skin: Positive for rash on right lower leg Neurological: Negative for loss of consciousness and headaches.  Endo/Heme/Allergies: Negative for polydipsia.  Psychiatric/Behavioral: Negative for depression and suicidal ideas. The patient is not nervous/anxious and does not have insomnia.   Objective  BP 114/72 mmHg  Pulse 82  Temp(Src) 98.2 F (36.8 C) (Oral)  Ht 5\' 3"  (1.6 m)  Wt 119 lb (53.978 kg)  BMI 21.09 kg/m2  SpO2 97%  LMP 07/31/1990  Physical Exam   Constitutional: She is oriented to person, place, and time. She appears well-nourished. No distress.  Eyes: EOM are normal. Pupils are  equal, round, and reactive to light.  Cardiovascular: Normal rate and regular rhythm.  Pulmonary/Chest: Breath sounds normal.  Abdominal: Soft. Bowel sounds are normal.  Lymphadenopathy:   She has no cervical adenopathy. Pain during examination Neurological: She is alert and oriented to person, place, and time. She has normal reflexes. No cranial nerve deficit.    Assessment & Plan  Bronchitis  -Amoxicillin 500mg  BID x7days -supportive care  GERD -Tolerable -Avoid offending foods, start probiotics.  -Do not eat large meals in late evening and consider raising head of bed.  -Continue current medications  Hypothyroidism -On Levothyroxine -TSH level to see current status  Chronic Rotator Cuff Pain -getting injections and seeing orthopedic specialist  Constipation -Encouraged daily probiotics is doing better with han herbal cleanser -doing well with lactulose  Weight Loss -Able to maintain weight since last appointment has been between 115-120  Preventative -PAP smear in February  Scabies -improving -Home needs a deep clean and rx for Permethrin  Thrombocythemia -being managed with hydroxyurea

## 2015-06-17 NOTE — Patient Instructions (Addendum)

## 2015-06-17 NOTE — Progress Notes (Signed)
Pre visit review using our clinic review tool, if applicable. No additional management support is needed unless otherwise documented below in the visit note. 

## 2015-06-18 ENCOUNTER — Other Ambulatory Visit: Payer: Self-pay | Admitting: Family Medicine

## 2015-06-18 LAB — CBC
HCT: 35.2 % — ABNORMAL LOW (ref 36.0–46.0)
Hemoglobin: 11.6 g/dL — ABNORMAL LOW (ref 12.0–15.0)
MCHC: 33 g/dL (ref 30.0–36.0)
MCV: 124.3 fl — ABNORMAL HIGH (ref 78.0–100.0)
Platelets: 440 10*3/uL — ABNORMAL HIGH (ref 150.0–400.0)
RBC: 2.83 Mil/uL — ABNORMAL LOW (ref 3.87–5.11)
RDW: 16.4 % — ABNORMAL HIGH (ref 11.5–15.5)
WBC: 4.3 10*3/uL (ref 4.0–10.5)

## 2015-06-18 LAB — COMPREHENSIVE METABOLIC PANEL
ALT: 11 U/L (ref 0–35)
AST: 19 U/L (ref 0–37)
Albumin: 4.1 g/dL (ref 3.5–5.2)
Alkaline Phosphatase: 61 U/L (ref 39–117)
BUN: 11 mg/dL (ref 6–23)
CO2: 30 mEq/L (ref 19–32)
Calcium: 9.6 mg/dL (ref 8.4–10.5)
Chloride: 101 mEq/L (ref 96–112)
Creatinine, Ser: 0.61 mg/dL (ref 0.40–1.20)
GFR: 105.16 mL/min (ref >60.00)
Glucose, Bld: 67 mg/dL — ABNORMAL LOW (ref 70–99)
Potassium: 4.3 mEq/L (ref 3.5–5.1)
Sodium: 138 mEq/L (ref 135–145)
Total Bilirubin: 0.4 mg/dL (ref 0.2–1.2)
Total Protein: 6.8 g/dL (ref 6.0–8.3)

## 2015-06-18 LAB — URINALYSIS
Bilirubin Urine: NEGATIVE
Hgb urine dipstick: NEGATIVE
Ketones, ur: NEGATIVE
Leukocytes, UA: NEGATIVE
Nitrite: NEGATIVE
Specific Gravity, Urine: 1.015 (ref 1.000–1.030)
Total Protein, Urine: NEGATIVE
Urine Glucose: NEGATIVE
Urobilinogen, UA: 0.2 (ref 0.0–1.0)
pH: 8 (ref 5.0–8.0)

## 2015-06-18 LAB — LIPID PANEL
Cholesterol: 185 mg/dL (ref 0–200)
HDL: 66.8 mg/dL (ref >39.00)
LDL Cholesterol: 100 mg/dL — ABNORMAL HIGH (ref 0–99)
NonHDL: 118.69
Total CHOL/HDL Ratio: 3
Triglycerides: 95 mg/dL (ref 0.0–149.0)
VLDL: 19 mg/dL (ref 0.0–40.0)

## 2015-06-18 LAB — URINE CULTURE
Colony Count: NO GROWTH
Organism ID, Bacteria: NO GROWTH

## 2015-06-18 LAB — TSH: TSH: 5.75 u[IU]/mL — ABNORMAL HIGH (ref 0.35–4.50)

## 2015-06-26 DIAGNOSIS — J209 Acute bronchitis, unspecified: Secondary | ICD-10-CM | POA: Insufficient documentation

## 2015-06-26 NOTE — Assessment & Plan Note (Signed)
On Levothyroxine, continue to monitor 

## 2015-06-26 NOTE — Assessment & Plan Note (Signed)
Urine culture negative.

## 2015-06-26 NOTE — Assessment & Plan Note (Signed)
Amoxicillin, Mucinex, probiotics. Encouraged increased rest and hydration, add probiotics, zinc such as Coldeze or Xicam. Treat fevers as needed

## 2015-06-26 NOTE — Assessment & Plan Note (Signed)
Encouraged heart healthy diet, increase exercise, avoid trans fats, consider a krill oil cap daily 

## 2015-06-26 NOTE — Assessment & Plan Note (Signed)
Doing well on Citalopram. Appetite improved. Nausea improved, manages with Zofran prn

## 2015-06-26 NOTE — Assessment & Plan Note (Signed)
Follows with hematology. stable

## 2015-06-26 NOTE — Assessment & Plan Note (Signed)
Symptoms improved, may need deep clean and retreat in future
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
## 2015-06-30 ENCOUNTER — Other Ambulatory Visit: Payer: Self-pay | Admitting: Hematology & Oncology

## 2015-07-01 ENCOUNTER — Telehealth: Payer: Self-pay | Admitting: Family Medicine

## 2015-07-01 MED ORDER — LEVOTHYROXINE SODIUM 88 MCG PO TABS: ORAL_TABLET | ORAL | Status: DC

## 2015-07-01 NOTE — Telephone Encounter (Signed)
SENT IN SCRIPT AND updated list. Patient informed

## 2015-07-01 NOTE — Telephone Encounter (Signed)
Caller name: Donelda   Relationship to patient: Self   Can be reached: [phone] (cell)  or [phone] (home)  Pharmacy: [facility], [place]  Reason for call: pt says that she was instructed to take more of her medication (levothyroxine). She says that if she take more she will run out by the weekend. Please advise further.

## 2015-07-07 ENCOUNTER — Other Ambulatory Visit: Payer: Self-pay | Admitting: Family Medicine

## 2015-07-18 ENCOUNTER — Emergency Department (HOSPITAL_COMMUNITY)
Admission: EM | Admit: 2015-07-18 | Discharge: 2015-07-18 | Disposition: A | Discharge status: Home / Self Care | Payer: BLUE CROSS/BLUE SHIELD | Attending: Emergency Medicine | Admitting: Emergency Medicine

## 2015-07-18 ENCOUNTER — Emergency Department (HOSPITAL_COMMUNITY): Payer: BLUE CROSS/BLUE SHIELD

## 2015-07-18 ENCOUNTER — Encounter (HOSPITAL_COMMUNITY): Payer: Self-pay | Admitting: *Deleted

## 2015-07-18 DIAGNOSIS — D509 Iron deficiency anemia, unspecified: Secondary | ICD-10-CM | POA: Insufficient documentation

## 2015-07-18 DIAGNOSIS — E079 Disorder of thyroid, unspecified: Secondary | ICD-10-CM | POA: Diagnosis not present

## 2015-07-18 DIAGNOSIS — S161XXA Strain of muscle, fascia and tendon at neck level, initial encounter: Secondary | ICD-10-CM | POA: Insufficient documentation

## 2015-07-18 DIAGNOSIS — Z87891 Personal history of nicotine dependence: Secondary | ICD-10-CM | POA: Insufficient documentation

## 2015-07-18 DIAGNOSIS — Z7982 Long term (current) use of aspirin: Secondary | ICD-10-CM | POA: Diagnosis not present

## 2015-07-18 DIAGNOSIS — S39012A Strain of muscle, fascia and tendon of lower back, initial encounter: Secondary | ICD-10-CM | POA: Insufficient documentation

## 2015-07-18 DIAGNOSIS — M1990 Unspecified osteoarthritis, unspecified site: Secondary | ICD-10-CM | POA: Insufficient documentation

## 2015-07-18 DIAGNOSIS — S60222A Contusion of left hand, initial encounter: Secondary | ICD-10-CM | POA: Insufficient documentation

## 2015-07-18 DIAGNOSIS — S301XXA Contusion of abdominal wall, initial encounter: Secondary | ICD-10-CM | POA: Diagnosis not present

## 2015-07-18 DIAGNOSIS — F418 Other specified anxiety disorders: Secondary | ICD-10-CM | POA: Insufficient documentation

## 2015-07-18 DIAGNOSIS — Z792 Long term (current) use of antibiotics: Secondary | ICD-10-CM | POA: Diagnosis not present

## 2015-07-18 DIAGNOSIS — S199XXA Unspecified injury of neck, initial encounter: Secondary | ICD-10-CM | POA: Diagnosis present

## 2015-07-18 DIAGNOSIS — Z791 Long term (current) use of non-steroidal anti-inflammatories (NSAID): Secondary | ICD-10-CM | POA: Insufficient documentation

## 2015-07-18 DIAGNOSIS — Z7952 Long term (current) use of systemic steroids: Secondary | ICD-10-CM | POA: Insufficient documentation

## 2015-07-18 DIAGNOSIS — Y9389 Activity, other specified: Secondary | ICD-10-CM | POA: Insufficient documentation

## 2015-07-18 DIAGNOSIS — Z79899 Other long term (current) drug therapy: Secondary | ICD-10-CM | POA: Insufficient documentation

## 2015-07-18 DIAGNOSIS — K219 Gastro-esophageal reflux disease without esophagitis: Secondary | ICD-10-CM | POA: Diagnosis not present

## 2015-07-18 DIAGNOSIS — E785 Hyperlipidemia, unspecified: Secondary | ICD-10-CM | POA: Insufficient documentation

## 2015-07-18 DIAGNOSIS — Z79818 Long term (current) use of other agents affecting estrogen receptors and estrogen levels: Secondary | ICD-10-CM | POA: Diagnosis not present

## 2015-07-18 DIAGNOSIS — Y92410 Unspecified street and highway as the place of occurrence of the external cause: Secondary | ICD-10-CM | POA: Insufficient documentation

## 2015-07-18 DIAGNOSIS — H269 Unspecified cataract: Secondary | ICD-10-CM | POA: Insufficient documentation

## 2015-07-18 DIAGNOSIS — G2581 Restless legs syndrome: Secondary | ICD-10-CM | POA: Insufficient documentation

## 2015-07-18 DIAGNOSIS — Y998 Other external cause status: Secondary | ICD-10-CM | POA: Diagnosis not present

## 2015-07-18 DIAGNOSIS — Z8619 Personal history of other infectious and parasitic diseases: Secondary | ICD-10-CM | POA: Insufficient documentation

## 2015-07-18 LAB — I-STAT CHEM 8, ED
BUN: 18 mg/dL (ref 6–20)
Calcium, Ion: 1.12 mmol/L — ABNORMAL LOW (ref 1.13–1.30)
Chloride: 103 mmol/L (ref 101–111)
Creatinine, Ser: 4.3 mg/dL — ABNORMAL HIGH (ref 0.44–1.00)
Glucose, Bld: 118 mg/dL — ABNORMAL HIGH (ref 65–99)
HCT: 35 % — ABNORMAL LOW (ref 36.0–46.0)
Hemoglobin: 11.9 g/dL — ABNORMAL LOW (ref 12.0–15.0)
Potassium: 4.4 mmol/L (ref 3.5–5.1)
Sodium: 140 mmol/L (ref 135–145)
TCO2: 30 mmol/L (ref 0–100)

## 2015-07-18 LAB — BASIC METABOLIC PANEL
Anion gap: 8 (ref 5–15)
BUN: 15 mg/dL (ref 6–20)
CO2: 29 mmol/L (ref 22–32)
Calcium: 8.6 mg/dL — ABNORMAL LOW (ref 8.9–10.3)
Chloride: 103 mmol/L (ref 101–111)
Creatinine, Ser: 0.67 mg/dL (ref 0.44–1.00)
GFR calc Af Amer: >60 mL/min (ref >60)
GFR calc non Af Amer: >60 mL/min (ref >60)
Glucose, Bld: 99 mg/dL (ref 65–99)
Potassium: 4.3 mmol/L (ref 3.5–5.1)
Sodium: 140 mmol/L (ref 135–145)

## 2015-07-18 LAB — CBC WITH DIFFERENTIAL/PLATELET
Basophils Absolute: 0 10*3/uL (ref 0.0–0.1)
Basophils Relative: 0 %
Eosinophils Absolute: 0.1 10*3/uL (ref 0.0–0.7)
Eosinophils Relative: 3 %
HCT: 32.4 % — ABNORMAL LOW (ref 36.0–46.0)
Hemoglobin: 10.7 g/dL — ABNORMAL LOW (ref 12.0–15.0)
Lymphocytes Relative: 19 %
Lymphs Abs: 0.9 10*3/uL (ref 0.7–4.0)
MCH: 41.5 pg — ABNORMAL HIGH (ref 26.0–34.0)
MCHC: 33 g/dL (ref 30.0–36.0)
MCV: 125.6 fL — ABNORMAL HIGH (ref 78.0–100.0)
Monocytes Absolute: 0.2 10*3/uL (ref 0.1–1.0)
Monocytes Relative: 4 %
Neutro Abs: 3.6 10*3/uL (ref 1.7–7.7)
Neutrophils Relative %: 74 %
Platelets: 392 10*3/uL (ref 150–400)
RBC: 2.58 MIL/uL — ABNORMAL LOW (ref 3.87–5.11)
RDW: 13.9 % (ref 11.5–15.5)
WBC: 4.8 10*3/uL (ref 4.0–10.5)

## 2015-07-18 MED ORDER — IOHEXOL 300 MG/ML  SOLN
100.0000 mL | Freq: Once | INTRAMUSCULAR | Status: AC | PRN
  Administered 2015-07-18: 100 mL via INTRAVENOUS

## 2015-07-18 MED ORDER — SODIUM CHLORIDE 0.9 % IV BOLUS (SEPSIS)
500.0000 mL | Freq: Once | INTRAVENOUS | Status: AC
  Administered 2015-07-18: 500 mL via INTRAVENOUS

## 2015-07-18 MED ORDER — HYDROCODONE-ACETAMINOPHEN 5-325 MG PO TABS: 1.0000 | ORAL_TABLET | Freq: Four times a day (QID) | ORAL | Status: DC | PRN
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
## 2015-07-26 ENCOUNTER — Other Ambulatory Visit: Payer: Self-pay | Admitting: Family Medicine

## 2015-08-05 ENCOUNTER — Ambulatory Visit (INDEPENDENT_AMBULATORY_CARE_PROVIDER_SITE_OTHER): Payer: BLUE CROSS/BLUE SHIELD | Admitting: Medical

## 2015-08-05 ENCOUNTER — Encounter: Payer: Self-pay | Admitting: Medical

## 2015-08-05 ENCOUNTER — Ambulatory Visit (HOSPITAL_BASED_OUTPATIENT_CLINIC_OR_DEPARTMENT_OTHER)
Admission: RE | Admit: 2015-08-05 | Discharge: 2015-08-05 | Disposition: A | Discharge status: Home / Self Care | Payer: BLUE CROSS/BLUE SHIELD | Source: Ambulatory Visit | Attending: Medical | Admitting: Medical

## 2015-08-05 VITALS — BP 122/68 | HR 89 | Temp 98.1°F | Ht 63.0 in | Wt 124.0 lb

## 2015-08-05 DIAGNOSIS — M79622 Pain in left upper arm: Secondary | ICD-10-CM

## 2015-08-05 DIAGNOSIS — M25512 Pain in left shoulder: Secondary | ICD-10-CM | POA: Diagnosis present

## 2015-08-05 DIAGNOSIS — M25562 Pain in left knee: Secondary | ICD-10-CM | POA: Insufficient documentation

## 2015-08-05 DIAGNOSIS — M79602 Pain in left arm: Secondary | ICD-10-CM | POA: Diagnosis not present

## 2015-08-05 DIAGNOSIS — M25522 Pain in left elbow: Secondary | ICD-10-CM | POA: Diagnosis not present

## 2015-08-05 MED ORDER — CYCLOBENZAPRINE HCL 5 MG PO TABS
5.0000 mg | ORAL_TABLET | Freq: Every day | ORAL | Status: DC
Start: 2015-08-05 — End: 2015-08-06

## 2015-08-05 MED ORDER — HYDROCODONE-ACETAMINOPHEN 5-325 MG PO TABS
ORAL_TABLET | ORAL | Status: DC
Start: 2015-08-05 — End: 2015-09-16

## 2015-08-05 NOTE — Progress Notes (Signed)
Pre visit review using our clinic review tool, if applicable. No additional management support is needed unless otherwise documented below in the visit note. 
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
## 2015-08-05 NOTE — Patient Instructions (Signed)
For various areas of pain will get xrays.  For pain rx norco.  For muscle spasms rx cyclobenzaprine.  If not fractures expects pain to gradually subside over next 10 days.  Follow up in 2 wks or as needed

## 2015-08-06 ENCOUNTER — Other Ambulatory Visit: Payer: Self-pay

## 2015-08-06 MED ORDER — CYCLOBENZAPRINE HCL 5 MG PO TABS: 5.0000 mg | ORAL_TABLET | Freq: Every day | ORAL | Status: DC

## 2015-08-06 NOTE — Telephone Encounter (Signed)
Rx sent to pharmacy and pt was notified.

## 2015-08-13 ENCOUNTER — Other Ambulatory Visit: Payer: Self-pay | Admitting: Family Medicine

## 2015-08-13 ENCOUNTER — Other Ambulatory Visit: Payer: Self-pay | Admitting: Hematology & Oncology

## 2015-08-13 MED ORDER — ACETAMINOPHEN-CODEINE #3 300-30 MG PO TABS: ORAL_TABLET | ORAL | Status: DC

## 2015-08-13 NOTE — Telephone Encounter (Signed)
Faxed hardcopy for Tylenol/codeine to [facility] in [place]

## 2015-08-13 NOTE — Telephone Encounter (Signed)
Requesting: APAP/Codeine Contract  Signed on 08/17/2014 UDS  Moderate, due Last OV  06/17/2015 Last Refill   #90 with 0 refills on 06/17/2015  Please Advise

## 2015-08-19 ENCOUNTER — Other Ambulatory Visit: Payer: Self-pay | Admitting: Family Medicine

## 2015-08-20 ENCOUNTER — Other Ambulatory Visit: Payer: Self-pay | Admitting: Family Medicine

## 2015-08-20 MED ORDER — ALPRAZOLAM 1 MG PO TABS: 1.0000 mg | ORAL_TABLET | Freq: Three times a day (TID) | ORAL | Status: DC | PRN

## 2015-08-20 NOTE — Telephone Encounter (Signed)
Faxed hardcopy for Alprazolam to [facility]

## 2015-08-20 NOTE — Telephone Encounter (Signed)
Requesting:  Alprazolam Contract   01/14/15 UDS  Moderate Last OV   06/17/2015 Last Refill   #90  06/17/2015  Please Advise

## 2015-08-24 ENCOUNTER — Other Ambulatory Visit: Payer: Self-pay | Admitting: Family Medicine

## 2015-08-24 MED ORDER — NAPROXEN 500 MG PO TABS: 500.0000 mg | ORAL_TABLET | Freq: Two times a day (BID) | ORAL | Status: DC

## 2015-08-24 MED ORDER — PRAMIPEXOLE DIHYDROCHLORIDE 0.25 MG PO TABS: ORAL_TABLET | ORAL | Status: DC

## 2015-09-03 ENCOUNTER — Other Ambulatory Visit (HOSPITAL_BASED_OUTPATIENT_CLINIC_OR_DEPARTMENT_OTHER): Payer: BLUE CROSS/BLUE SHIELD

## 2015-09-03 ENCOUNTER — Ambulatory Visit (HOSPITAL_BASED_OUTPATIENT_CLINIC_OR_DEPARTMENT_OTHER): Payer: BLUE CROSS/BLUE SHIELD | Admitting: Hematology & Oncology

## 2015-09-03 ENCOUNTER — Encounter: Payer: Self-pay | Admitting: Hematology & Oncology

## 2015-09-03 VITALS — BP 118/74 | HR 85 | Temp 97.9°F | Resp 18 | Ht 63.0 in | Wt 129.0 lb

## 2015-09-03 DIAGNOSIS — D473 Essential (hemorrhagic) thrombocythemia: Secondary | ICD-10-CM

## 2015-09-03 DIAGNOSIS — K5901 Slow transit constipation: Secondary | ICD-10-CM

## 2015-09-03 LAB — CBC WITH DIFFERENTIAL (CANCER CENTER ONLY)
BASO#: 0 10*3/uL (ref 0.0–0.2)
BASO%: 0.3 % (ref 0.0–2.0)
EOS%: 5.7 % (ref 0.0–7.0)
Eosinophils Absolute: 0.2 10*3/uL (ref 0.0–0.5)
HCT: 31.8 % — ABNORMAL LOW (ref 34.8–46.6)
HGB: 10.4 g/dL — ABNORMAL LOW (ref 11.6–15.9)
LYMPH#: 1 10*3/uL (ref 0.9–3.3)
LYMPH%: 28.1 % (ref 14.0–48.0)
MCH: 40.9 pg — ABNORMAL HIGH (ref 26.0–34.0)
MCHC: 32.7 g/dL (ref 32.0–36.0)
MCV: 125 fL — ABNORMAL HIGH (ref 81–101)
MONO#: 0.3 10*3/uL (ref 0.1–0.9)
MONO%: 7.2 % (ref 0.0–13.0)
NEUT#: 2.1 10*3/uL (ref 1.5–6.5)
NEUT%: 58.7 % (ref 39.6–80.0)
Platelets: 400 10*3/uL (ref 145–400)
RBC: 2.54 10*6/uL — ABNORMAL LOW (ref 3.70–5.32)
RDW: 11.9 % (ref 11.1–15.7)
WBC: 3.5 10*3/uL — ABNORMAL LOW (ref 3.9–10.0)

## 2015-09-03 LAB — COMPREHENSIVE METABOLIC PANEL
ALT: 12 U/L (ref 0–55)
AST: 22 U/L (ref 5–34)
Albumin: 3.7 g/dL (ref 3.5–5.0)
Alkaline Phosphatase: 75 U/L (ref 40–150)
Anion Gap: 10 mEq/L (ref 3–11)
BUN: 18.3 mg/dL (ref 7.0–26.0)
CO2: 26 mEq/L (ref 22–29)
Calcium: 8.9 mg/dL (ref 8.4–10.4)
Chloride: 105 mEq/L (ref 98–109)
Creatinine: 0.7 mg/dL (ref 0.6–1.1)
EGFR: 87 mL/min/{1.73_m2} — ABNORMAL LOW (ref >90)
Glucose: 70 mg/dl (ref 70–140)
Potassium: 4.2 mEq/L (ref 3.5–5.1)
Sodium: 141 mEq/L (ref 136–145)
Total Bilirubin: <0.3 mg/dL (ref 0.20–1.20)
Total Protein: 7.1 g/dL (ref 6.4–8.3)

## 2015-09-03 LAB — IRON AND TIBC
%SAT: 47 % (ref 21–57)
Iron: 96 ug/dL (ref 41–142)
TIBC: 204 ug/dL — ABNORMAL LOW (ref 236–444)
UIBC: 108 ug/dL — ABNORMAL LOW (ref 120–384)

## 2015-09-03 LAB — FERRITIN: Ferritin: 223 ng/ml (ref 9–269)

## 2015-09-03 LAB — LACTATE DEHYDROGENASE: LDH: 197 U/L (ref 125–245)

## 2015-09-03 LAB — CHCC SATELLITE - SMEAR
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
## 2015-09-13 ENCOUNTER — Other Ambulatory Visit: Payer: Self-pay | Admitting: *Deleted

## 2015-09-13 DIAGNOSIS — D473 Essential (hemorrhagic) thrombocythemia: Secondary | ICD-10-CM

## 2015-09-13 MED ORDER — HYDROXYUREA 500 MG PO CAPS: 1000.0000 mg | ORAL_CAPSULE | Freq: Every day | ORAL | Status: DC

## 2015-09-15 ENCOUNTER — Telehealth: Payer: Self-pay | Admitting: Behavioral Health

## 2015-09-15 ENCOUNTER — Encounter: Payer: Self-pay | Admitting: Behavioral Health

## 2015-09-15 NOTE — Telephone Encounter (Signed)
Pre-Visit Call completed with patient and chart updated.   Pre-Visit Info documented in Specialty Comments under [employer].    

## 2015-09-16 ENCOUNTER — Ambulatory Visit (INDEPENDENT_AMBULATORY_CARE_PROVIDER_SITE_OTHER): Payer: BLUE CROSS/BLUE SHIELD | Admitting: Family Medicine

## 2015-09-16 ENCOUNTER — Encounter: Payer: Self-pay | Admitting: Family Medicine

## 2015-09-16 VITALS — BP 94/62 | HR 100 | Temp 97.6°F | Ht 63.0 in | Wt 124.0 lb

## 2015-09-16 DIAGNOSIS — Z0000 Encounter for general adult medical examination without abnormal findings: Secondary | ICD-10-CM

## 2015-09-16 DIAGNOSIS — R031 Nonspecific low blood-pressure reading: Secondary | ICD-10-CM

## 2015-09-16 DIAGNOSIS — D473 Essential (hemorrhagic) thrombocythemia: Secondary | ICD-10-CM | POA: Diagnosis not present

## 2015-09-16 DIAGNOSIS — E039 Hypothyroidism, unspecified: Secondary | ICD-10-CM | POA: Diagnosis not present

## 2015-09-16 DIAGNOSIS — K219 Gastro-esophageal reflux disease without esophagitis: Secondary | ICD-10-CM

## 2015-09-16 DIAGNOSIS — F418 Other specified anxiety disorders: Secondary | ICD-10-CM

## 2015-09-16 DIAGNOSIS — E785 Hyperlipidemia, unspecified: Secondary | ICD-10-CM

## 2015-09-16 LAB — LIPID PANEL
Cholesterol: 191 mg/dL (ref 0–200)
HDL: 51.6 mg/dL (ref >39.00)
LDL Cholesterol: 105 mg/dL — ABNORMAL HIGH (ref 0–99)
NonHDL: 139.67
Total CHOL/HDL Ratio: 4
Triglycerides: 172 mg/dL — ABNORMAL HIGH (ref 0.0–149.0)
VLDL: 34.4 mg/dL (ref 0.0–40.0)

## 2015-09-16 LAB — COMPREHENSIVE METABOLIC PANEL
ALT: 11 U/L (ref 0–35)
AST: 16 U/L (ref 0–37)
Albumin: 4 g/dL (ref 3.5–5.2)
Alkaline Phosphatase: 57 U/L (ref 39–117)
BUN: 16 mg/dL (ref 6–23)
CO2: 33 mEq/L — ABNORMAL HIGH (ref 19–32)
Calcium: 8.9 mg/dL (ref 8.4–10.5)
Chloride: 104 mEq/L (ref 96–112)
Creatinine, Ser: 0.66 mg/dL (ref 0.40–1.20)
GFR: 95.95 mL/min (ref >60.00)
Glucose, Bld: 81 mg/dL (ref 70–99)
Potassium: 4 mEq/L (ref 3.5–5.1)
Sodium: 139 mEq/L (ref 135–145)
Total Bilirubin: 0.2 mg/dL (ref 0.2–1.2)
Total Protein: 7 g/dL (ref 6.0–8.3)

## 2015-09-16 LAB — CBC
HCT: 32.8 % — ABNORMAL LOW (ref 36.0–46.0)
Hemoglobin: 10.9 g/dL — ABNORMAL LOW (ref 12.0–15.0)
MCHC: 33 g/dL (ref 30.0–36.0)
MCV: 122.5 fl — ABNORMAL HIGH (ref 78.0–100.0)
Platelets: 427 10*3/uL — ABNORMAL HIGH (ref 150.0–400.0)
RBC: 2.68 Mil/uL — ABNORMAL LOW (ref 3.87–5.11)
RDW: 14.9 % (ref 11.5–15.5)
WBC: 3.2 10*3/uL — ABNORMAL LOW (ref 4.0–10.5)

## 2015-09-16 LAB — TSH: TSH: 0.25 u[IU]/mL — ABNORMAL LOW (ref 0.35–4.50)

## 2015-09-16 MED ORDER — SPACER/AERO-HOLDING CHAMBERS DEVI: 1.0000 | Freq: Two times a day (BID) | Status: DC | PRN

## 2015-09-16 MED ORDER — CYCLOBENZAPRINE HCL 10 MG PO TABS: 10.0000 mg | ORAL_TABLET | Freq: Two times a day (BID) | ORAL | Status: DC | PRN

## 2015-09-16 MED ORDER — FLUTICASONE PROPIONATE HFA 110 MCG/ACT IN AERO: 2.0000 | INHALATION_SPRAY | Freq: Two times a day (BID) | RESPIRATORY_TRACT | Status: DC

## 2015-09-16 MED ORDER — LORATADINE 10 MG PO TABS: ORAL_TABLET | ORAL | Status: DC

## 2015-09-16 MED ORDER — HYDROCODONE-ACETAMINOPHEN 5-325 MG PO TABS: ORAL_TABLET | ORAL | Status: DC

## 2015-09-16 NOTE — Progress Notes (Signed)
Pre visit review using our clinic review tool, if applicable. No additional management support is needed unless otherwise documented below in the visit note. 

## 2015-09-16 NOTE — Assessment & Plan Note (Signed)
Did not increase the Synthyroid to the extra tab once a week. Will recheck tsh today

## 2015-09-16 NOTE — Patient Instructions (Signed)
Preventive Care for Adults, Female A healthy lifestyle and preventive care can promote health and wellness. Preventive health guidelines for women include the following key practices.  A routine yearly physical is a good way to check with your health care provider about your health and preventive screening. It is a chance to share any concerns and updates on your health and to receive a thorough exam.  Visit your dentist for a routine exam and preventive care every 6 months. Brush your teeth twice a day and floss once a day. Good oral hygiene prevents tooth decay and gum disease.  The frequency of eye exams is based on your age, health, family medical history, use of contact lenses, and other factors. Follow your health care provider's recommendations for frequency of eye exams.  Eat a healthy diet. Foods like vegetables, fruits, whole grains, low-fat dairy products, and lean protein foods contain the nutrients you need without too many calories. Decrease your intake of foods high in solid fats, added sugars, and salt. Eat the right amount of calories for you.Get information about a proper diet from your health care provider, if necessary.  Regular physical exercise is one of the most important things you can do for your health. Most adults should get at least 150 minutes of moderate-intensity exercise (any activity that increases your heart rate and causes you to sweat) each week. In addition, most adults need muscle-strengthening exercises on 2 or more days a week.  Maintain a healthy weight. The body mass index (BMI) is a screening tool to identify possible weight problems. It provides an estimate of body fat based on height and weight. Your health care provider can find your BMI and can help you achieve or maintain a healthy weight.For adults 20 years and older:  A BMI below 18.5 is considered underweight.  A BMI of 18.5 to 24.9 is normal.  A BMI of 25 to 29.9 is considered  overweight.  A BMI of 30 and above is considered obese.  Maintain normal blood lipids and cholesterol levels by exercising and minimizing your intake of saturated fat. Eat a balanced diet with plenty of fruit and vegetables. Blood tests for lipids and cholesterol should begin at age 64 and be repeated every 5 years. If your lipid or cholesterol levels are high, you are over 50, or you are at high risk for heart disease, you may need your cholesterol levels checked more frequently.Ongoing high lipid and cholesterol levels should be treated with medicines if diet and exercise are not working.  If you smoke, find out from your health care provider how to quit. If you do not use tobacco, do not start.  Lung cancer screening is recommended for adults aged 52-80 years who are at high risk for developing lung cancer because of a history of smoking. A yearly low-dose CT scan of the lungs is recommended for people who have at least a 30-pack-year history of smoking and are a current smoker or have quit within the past 15 years. A pack year of smoking is smoking an average of 1 pack of cigarettes a day for 1 year (for example: 1 pack a day for 30 years or 2 packs a day for 15 years). Yearly screening should continue until the smoker has stopped smoking for at least 15 years. Yearly screening should be stopped for people who develop a health problem that would prevent them from having lung cancer treatment.  If you are pregnant, do not drink alcohol. If you are  breastfeeding, be very cautious about drinking alcohol. If you are not pregnant and choose to drink alcohol, do not have more than 1 drink per day. One drink is considered to be 12 ounces (355 mL) of beer, 5 ounces (148 mL) of wine, or 1.5 ounces (44 mL) of liquor.  Avoid use of street drugs. Do not share needles with anyone. Ask for help if you need support or instructions about stopping the use of drugs.  High blood pressure causes heart disease and  increases the risk of stroke. Your blood pressure should be checked at least every 1 to 2 years. Ongoing high blood pressure should be treated with medicines if weight loss and exercise do not work.  If you are 63-46 years old, ask your health care provider if you should take aspirin to prevent strokes.  Diabetes screening is done by taking a blood sample to check your blood glucose level after you have not eaten for a certain period of time (fasting). If you are not overweight and you do not have risk factors for diabetes, you should be screened once every 3 years starting at age 35. If you are overweight or obese and you are 49-58 years of age, you should be screened for diabetes every year as part of your cardiovascular risk assessment.  Breast cancer screening is essential preventive care for women. You should practice "breast self-awareness." This means understanding the normal appearance and feel of your breasts and may include breast self-examination. Any changes detected, no matter how small, should be reported to a health care provider. Women in their 64s and 30s should have a clinical breast exam (CBE) by a health care provider as part of a regular health exam every 1 to 3 years. After age 46, women should have a CBE every year. Starting at age 56, women should consider having a mammogram (breast X-ray test) every year. Women who have a family history of breast cancer should talk to their health care provider about genetic screening. Women at a high risk of breast cancer should talk to their health care providers about having an MRI and a mammogram every year.  Breast cancer gene (BRCA)-related cancer risk assessment is recommended for women who have family members with BRCA-related cancers. BRCA-related cancers include breast, ovarian, tubal, and peritoneal cancers. Having family members with these cancers may be associated with an increased risk for harmful changes (mutations) in the breast  cancer genes BRCA1 and BRCA2. Results of the assessment will determine the need for genetic counseling and BRCA1 and BRCA2 testing.  Your health care provider may recommend that you be screened regularly for cancer of the pelvic organs (ovaries, uterus, and vagina). This screening involves a pelvic examination, including checking for microscopic changes to the surface of your cervix (Pap test). You may be encouraged to have this screening done every 3 years, beginning at age 66.  For women ages 69-65, health care providers may recommend pelvic exams and Pap testing every 3 years, or they may recommend the Pap and pelvic exam, combined with testing for human papilloma virus (HPV), every 5 years. Some types of HPV increase your risk of cervical cancer. Testing for HPV may also be done on women of any age with unclear Pap test results.  Other health care providers may not recommend any screening for nonpregnant women who are considered low risk for pelvic cancer and who do not have symptoms. Ask your health care provider if a screening pelvic exam is right for  you.  If you have had past treatment for cervical cancer or a condition that could lead to cancer, you need Pap tests and screening for cancer for at least 20 years after your treatment. If Pap tests have been discontinued, your risk factors (such as having a new sexual partner) need to be reassessed to determine if screening should resume. Some women have medical problems that increase the chance of getting cervical cancer. In these cases, your health care provider may recommend more frequent screening and Pap tests.  Colorectal cancer can be detected and often prevented. Most routine colorectal cancer screening begins at the age of 50 years and continues through age 75 years. However, your health care provider may recommend screening at an earlier age if you have risk factors for colon cancer. On a yearly basis, your health care provider may provide  home test kits to check for hidden blood in the stool. Use of a small camera at the end of a tube, to directly examine the colon (sigmoidoscopy or colonoscopy), can detect the earliest forms of colorectal cancer. Talk to your health care provider about this at age 50, when routine screening begins. Direct exam of the colon should be repeated every 5-10 years through age 75 years, unless early forms of precancerous polyps or small growths are found.  People who are at an increased risk for hepatitis B should be screened for this virus. You are considered at high risk for hepatitis B if:  You were born in a country where hepatitis B occurs often. Talk with your health care provider about which countries are considered high risk.  Your parents were born in a high-risk country and you have not received a shot to protect against hepatitis B (hepatitis B vaccine).  You have HIV or AIDS.  You use needles to inject street drugs.  You live with, or have sex with, someone who has hepatitis B.  You get hemodialysis treatment.  You take certain medicines for conditions like cancer, organ transplantation, and autoimmune conditions.  Hepatitis C blood testing is recommended for all people born from 1945 through 1965 and any individual with known risks for hepatitis C.  Practice safe sex. Use condoms and avoid high-risk sexual practices to reduce the spread of sexually transmitted infections (STIs). STIs include gonorrhea, chlamydia, syphilis, trichomonas, herpes, HPV, and human immunodeficiency virus (HIV). Herpes, HIV, and HPV are viral illnesses that have no cure. They can result in disability, cancer, and death.  You should be screened for sexually transmitted illnesses (STIs) including gonorrhea and chlamydia if:  You are sexually active and are younger than 24 years.  You are older than 24 years and your health care provider tells you that you are at risk for this type of infection.  Your sexual  activity has changed since you were last screened and you are at an increased risk for chlamydia or gonorrhea. Ask your health care provider if you are at risk.  If you are at risk of being infected with HIV, it is recommended that you take a prescription medicine daily to prevent HIV infection. This is called preexposure prophylaxis (PrEP). You are considered at risk if:  You are sexually active and do not regularly use condoms or know the HIV status of your partner(s).  You take drugs by injection.  You are sexually active with a partner who has HIV.  Talk with your health care provider about whether you are at high risk of being infected with HIV. If   you choose to begin PrEP, you should first be tested for HIV. You should then be tested every 3 months for as long as you are taking PrEP.  Osteoporosis is a disease in which the bones lose minerals and strength with aging. This can result in serious bone fractures or breaks. The risk of osteoporosis can be identified using a bone density scan. Women ages 1 years and over and women at risk for fractures or osteoporosis should discuss screening with their health care providers. Ask your health care provider whether you should take a calcium supplement or vitamin D to reduce the rate of osteoporosis.  Menopause can be associated with physical symptoms and risks. Hormone replacement therapy is available to decrease symptoms and risks. You should talk to your health care provider about whether hormone replacement therapy is right for you.  Use sunscreen. Apply sunscreen liberally and repeatedly throughout the day. You should seek shade when your shadow is shorter than you. Protect yourself by wearing long sleeves, pants, a wide-brimmed hat, and sunglasses year round, whenever you are outdoors.  Once a month, do a whole body skin exam, using a mirror to look at the skin on your back. Tell your health care provider of new moles, moles that have irregular  borders, moles that are larger than a pencil eraser, or moles that have changed in shape or color.  Stay current with required vaccines (immunizations).  Influenza vaccine. All adults should be immunized every year.  Tetanus, diphtheria, and acellular pertussis (Td, Tdap) vaccine. Pregnant women should receive 1 dose of Tdap vaccine during each pregnancy. The dose should be obtained regardless of the length of time since the last dose. Immunization is preferred during the 27th-36th week of gestation. An adult who has not previously received Tdap or who does not know her vaccine status should receive 1 dose of Tdap. This initial dose should be followed by tetanus and diphtheria toxoids (Td) booster doses every 10 years. Adults with an unknown or incomplete history of completing a 3-dose immunization series with Td-containing vaccines should begin or complete a primary immunization series including a Tdap dose. Adults should receive a Td booster every 10 years.  Varicella vaccine. An adult without evidence of immunity to varicella should receive 2 doses or a second dose if she has previously received 1 dose. Pregnant females who do not have evidence of immunity should receive the first dose after pregnancy. This first dose should be obtained before leaving the health care facility. The second dose should be obtained 4-8 weeks after the first dose.  Human papillomavirus (HPV) vaccine. Females aged 13-26 years who have not received the vaccine previously should obtain the 3-dose series. The vaccine is not recommended for use in pregnant females. However, pregnancy testing is not needed before receiving a dose. If a female is found to be pregnant after receiving a dose, no treatment is needed. In that case, the remaining doses should be delayed until after the pregnancy. Immunization is recommended for any person with an immunocompromised condition through the age of 24 years if she did not get any or all doses  earlier. During the 3-dose series, the second dose should be obtained 4-8 weeks after the first dose. The third dose should be obtained 24 weeks after the first dose and 16 weeks after the second dose.  Zoster vaccine. One dose is recommended for adults aged 97 years or older unless certain conditions are present.  Measles, mumps, and rubella (MMR) vaccine. Adults born  before 1957 generally are considered immune to measles and mumps. Adults born in [birth year] or later should have 1 or more doses of MMR vaccine unless there is a contraindication to the vaccine or there is laboratory evidence of immunity to each of the three diseases. A routine second dose of MMR vaccine should be obtained at least 28 days after the first dose for students attending postsecondary schools, health care workers, or international travelers. People who received inactivated measles vaccine or an unknown type of measles vaccine during 1963-1967 should receive 2 doses of MMR vaccine. People who received inactivated mumps vaccine or an unknown type of mumps vaccine before 1979 and are at high risk for mumps infection should consider immunization with 2 doses of MMR vaccine. For females of childbearing age, rubella immunity should be determined. If there is no evidence of immunity, females who are not pregnant should be vaccinated. If there is no evidence of immunity, females who are pregnant should delay immunization until after pregnancy. Unvaccinated health care workers born before 60 who lack laboratory evidence of measles, mumps, or rubella immunity or laboratory confirmation of disease should consider measles and mumps immunization with 2 doses of MMR vaccine or rubella immunization with 1 dose of MMR vaccine.  Pneumococcal 13-valent conjugate (PCV13) vaccine. When indicated, a person who is uncertain of his immunization history and has no record of immunization should receive the PCV13 vaccine. All adults 61 years of age and older  should receive this vaccine. An adult aged 92 years or older who has certain medical conditions and has not been previously immunized should receive 1 dose of PCV13 vaccine. This PCV13 should be followed with a dose of pneumococcal polysaccharide (PPSV23) vaccine. Adults who are at high risk for pneumococcal disease should obtain the PPSV23 vaccine at least 8 weeks after the dose of PCV13 vaccine. Adults older than 64 years of age who have normal immune system function should obtain the PPSV23 vaccine dose at least 1 year after the dose of PCV13 vaccine.  Pneumococcal polysaccharide (PPSV23) vaccine. When PCV13 is also indicated, PCV13 should be obtained first. All adults aged 2 years and older should be immunized. An adult younger than age 30 years who has certain medical conditions should be immunized. Any person who resides in a nursing home or long-term care facility should be immunized. An adult smoker should be immunized. People with an immunocompromised condition and certain other conditions should receive both PCV13 and PPSV23 vaccines. People with human immunodeficiency virus (HIV) infection should be immunized as soon as possible after diagnosis. Immunization during chemotherapy or radiation therapy should be avoided. Routine use of PPSV23 vaccine is not recommended for American Indians, [place] Natives, or people younger than 65 years unless there are medical conditions that require PPSV23 vaccine. When indicated, people who have unknown immunization and have no record of immunization should receive PPSV23 vaccine. One-time revaccination 5 years after the first dose of PPSV23 is recommended for people aged 19-64 years who have chronic kidney failure, nephrotic syndrome, asplenia, or immunocompromised conditions. People who received 1-2 doses of PPSV23 before age 44 years should receive another dose of PPSV23 vaccine at age 83 years or later if at least 5 years have passed since the previous dose. Doses  of PPSV23 are not needed for people immunized with PPSV23 at or after age 20 years.  Meningococcal vaccine. Adults with asplenia or persistent complement component deficiencies should receive 2 doses of quadrivalent meningococcal conjugate (MenACWY-D) vaccine. The doses should be obtained  at least 2 months apart. Microbiologists working with certain meningococcal bacteria, [employer] recruits, people at risk during an outbreak, and people who travel to or live in countries with a high rate of meningitis should be immunized. A first-year college student up through age 52 years who is living in a residence hall should receive a dose if she did not receive a dose on or after her 16th birthday. Adults who have certain high-risk conditions should receive one or more doses of vaccine.  Hepatitis A vaccine. Adults who wish to be protected from this disease, have certain high-risk conditions, work with hepatitis A-infected animals, work in hepatitis A research labs, or travel to or work in countries with a high rate of hepatitis A should be immunized. Adults who were previously unvaccinated and who anticipate close contact with an international adoptee during the first 60 days after arrival in the [place] from a country with a high rate of hepatitis A should be immunized.  Hepatitis B vaccine. Adults who wish to be protected from this disease, have certain high-risk conditions, may be exposed to blood or other infectious body fluids, are household contacts or sex partners of hepatitis B positive people, are clients or workers in certain care facilities, or travel to or work in countries with a high rate of hepatitis B should be immunized.  Haemophilus influenzae type b (Hib) vaccine. A previously unvaccinated person with asplenia or sickle cell disease or having a scheduled splenectomy should receive 1 dose of Hib vaccine. Regardless of previous immunization, a recipient of a hematopoietic stem cell transplant  should receive a 3-dose series 6-12 months after her successful transplant. Hib vaccine is not recommended for adults with HIV infection. Preventive Services / Frequency Ages 8 to 15 years  Blood pressure check.** / Every 3-5 years.  Lipid and cholesterol check.** / Every 5 years beginning at age 73.  Clinical breast exam.** / Every 3 years for women in their 43s and 23s.  BRCA-related cancer risk assessment.** / For women who have family members with a BRCA-related cancer (breast, ovarian, tubal, or peritoneal cancers).  Pap test.** / Every 2 years from ages 54 through 21. Every 3 years starting at age 75 through age 4 or 22 with a history of 3 consecutive normal Pap tests.  HPV screening.** / Every 3 years from ages 66 through ages 59 to 58 with a history of 3 consecutive normal Pap tests.  Hepatitis C blood test.** / For any individual with known risks for hepatitis C.  Skin self-exam. / Monthly.  Influenza vaccine. / Every year.  Tetanus, diphtheria, and acellular pertussis (Tdap, Td) vaccine.** / Consult your health care provider. Pregnant women should receive 1 dose of Tdap vaccine during each pregnancy. 1 dose of Td every 10 years.  Varicella vaccine.** / Consult your health care provider. Pregnant females who do not have evidence of immunity should receive the first dose after pregnancy.  HPV vaccine. / 3 doses over 6 months, if 45 and younger. The vaccine is not recommended for use in pregnant females. However, pregnancy testing is not needed before receiving a dose.  Measles, mumps, rubella (MMR) vaccine.** / You need at least 1 dose of MMR if you were born in 1957 or later. You may also need a 2nd dose. For females of childbearing age, rubella immunity should be determined. If there is no evidence of immunity, females who are not pregnant should be vaccinated. If there is no evidence of immunity, females who are  pregnant should delay immunization until after  pregnancy.  Pneumococcal 13-valent conjugate (PCV13) vaccine.** / Consult your health care provider.  Pneumococcal polysaccharide (PPSV23) vaccine.** / 1 to 2 doses if you smoke cigarettes or if you have certain conditions.  Meningococcal vaccine.** / 1 dose if you are age 70 to 54 years and a Market researcher living in a residence hall, or have one of several medical conditions, you need to get vaccinated against meningococcal disease. You may also need additional booster doses.  Hepatitis A vaccine.** / Consult your health care provider.  Hepatitis B vaccine.** / Consult your health care provider.  Haemophilus influenzae type b (Hib) vaccine.** / Consult your health care provider. Ages 4 to 59 years  Blood pressure check.** / Every year.  Lipid and cholesterol check.** / Every 5 years beginning at age 54 years.  Lung cancer screening. / Every year if you are aged 28-80 years and have a 30-pack-year history of smoking and currently smoke or have quit within the past 15 years. Yearly screening is stopped once you have quit smoking for at least 15 years or develop a health problem that would prevent you from having lung cancer treatment.  Clinical breast exam.** / Every year after age 83 years.  BRCA-related cancer risk assessment.** / For women who have family members with a BRCA-related cancer (breast, ovarian, tubal, or peritoneal cancers).  Mammogram.** / Every year beginning at age 40 years and continuing for as long as you are in good health. Consult with your health care provider.  Pap test.** / Every 3 years starting at age 73 years through age 97 or 36 years with a history of 3 consecutive normal Pap tests.  HPV screening.** / Every 3 years from ages 78 years through ages 33 to 74 years with a history of 3 consecutive normal Pap tests.  Fecal occult blood test (FOBT) of stool. / Every year beginning at age 74 years and continuing until age 46 years. You may not need  to do this test if you get a colonoscopy every 10 years.  Flexible sigmoidoscopy or colonoscopy.** / Every 5 years for a flexible sigmoidoscopy or every 10 years for a colonoscopy beginning at age 69 years and continuing until age 50 years.  Hepatitis C blood test.** / For all people born from [birth year] through 1965 and any individual with known risks for hepatitis C.  Skin self-exam. / Monthly.  Influenza vaccine. / Every year.  Tetanus, diphtheria, and acellular pertussis (Tdap/Td) vaccine.** / Consult your health care provider. Pregnant women should receive 1 dose of Tdap vaccine during each pregnancy. 1 dose of Td every 10 years.  Varicella vaccine.** / Consult your health care provider. Pregnant females who do not have evidence of immunity should receive the first dose after pregnancy.  Zoster vaccine.** / 1 dose for adults aged 65 years or older.  Measles, mumps, rubella (MMR) vaccine.** / You need at least 1 dose of MMR if you were born in 1957 or later. You may also need a second dose. For females of childbearing age, rubella immunity should be determined. If there is no evidence of immunity, females who are not pregnant should be vaccinated. If there is no evidence of immunity, females who are pregnant should delay immunization until after pregnancy.  Pneumococcal 13-valent conjugate (PCV13) vaccine.** / Consult your health care provider.  Pneumococcal polysaccharide (PPSV23) vaccine.** / 1 to 2 doses if you smoke cigarettes or if you have certain conditions.  Meningococcal vaccine.** /  Consult your health care provider.  Hepatitis A vaccine.** / Consult your health care provider.  Hepatitis B vaccine.** / Consult your health care provider.  Haemophilus influenzae type b (Hib) vaccine.** / Consult your health care provider. Ages 32 years and over  Blood pressure check.** / Every year.  Lipid and cholesterol check.** / Every 5 years beginning at age 28 years.  Lung cancer  screening. / Every year if you are aged 55-80 years and have a 30-pack-year history of smoking and currently smoke or have quit within the past 15 years. Yearly screening is stopped once you have quit smoking for at least 15 years or develop a health problem that would prevent you from having lung cancer treatment.  Clinical breast exam.** / Every year after age 5 years.  BRCA-related cancer risk assessment.** / For women who have family members with a BRCA-related cancer (breast, ovarian, tubal, or peritoneal cancers).  Mammogram.** / Every year beginning at age 6 years and continuing for as long as you are in good health. Consult with your health care provider.  Pap test.** / Every 3 years starting at age 10 years through age 38 or 49 years with 3 consecutive normal Pap tests. Testing can be stopped between 65 and 70 years with 3 consecutive normal Pap tests and no abnormal Pap or HPV tests in the past 10 years.  HPV screening.** / Every 3 years from ages 34 years through ages 66 or 59 years with a history of 3 consecutive normal Pap tests. Testing can be stopped between 65 and 70 years with 3 consecutive normal Pap tests and no abnormal Pap or HPV tests in the past 10 years.  Fecal occult blood test (FOBT) of stool. / Every year beginning at age 57 years and continuing until age 56 years. You may not need to do this test if you get a colonoscopy every 10 years.  Flexible sigmoidoscopy or colonoscopy.** / Every 5 years for a flexible sigmoidoscopy or every 10 years for a colonoscopy beginning at age 2 years and continuing until age 77 years.  Hepatitis C blood test.** / For all people born from [birth year] through 1964 and any individual with known risks for hepatitis C.  Osteoporosis screening.** / A one-time screening for women ages 18 years and over and women at risk for fractures or osteoporosis.  Skin self-exam. / Monthly.  Influenza vaccine. / Every year.  Tetanus, diphtheria, and  acellular pertussis (Tdap/Td) vaccine.** / 1 dose of Td every 10 years.  Varicella vaccine.** / Consult your health care provider.  Zoster vaccine.** / 1 dose for adults aged 32 years or older.  Pneumococcal 13-valent conjugate (PCV13) vaccine.** / Consult your health care provider.  Pneumococcal polysaccharide (PPSV23) vaccine.** / 1 dose for all adults aged 43 years and older.  Meningococcal vaccine.** / Consult your health care provider.  Hepatitis A vaccine.** / Consult your health care provider.  Hepatitis B vaccine.** / Consult your health care provider.  Haemophilus influenzae type b (Hib) vaccine.** / Consult your health care provider. ** Family history and personal history of risk and conditions may change your health care provider's recommendations.   This information is not intended to replace advice given to you by your health care provider. Make sure you discuss any questions you have with your health care provider.   Document Released: 09/12/2001 Document Revised: 08/07/2014 Document Reviewed: 12/12/2010 Elsevier Interactive Patient Education [employer].

## 2015-09-16 NOTE — Progress Notes (Signed)
Subjective:    Patient ID: Rachael Johnson, female    DOB: [birth year], 65 y.o.   MRN: [id number]  Chief Complaint  Patient presents with  . Annual Exam    HPI Patient is in today for Annual Physical Exam with numerous concerns.  Patient still having some pain from the degenerative disc disease in C5/C6 and right wrist carpal tunnel syndrome. Patient struggling with some SOB, hacking cough and more trouble breathing at night.  Denies CP/palp/HA/congestion/fevers/GI or GU c/o. Taking meds as prescribed     Past Medical History  Diagnosis Date  . Arthritis   . Allergy   . Thyroid disease   . Diverticulitis   . Frequent episodic tension-type headache   . Hypertension   . Seizure ([place])     childhood  . Anemia, iron deficiency 07/08/2012  . Depression with anxiety 03/24/2011  . Depression   . GERD (gastroesophageal reflux disease)   . Essential thrombocythemia ([place]) 01/24/2011  . H. pylori infection 12/19/2012  . Other and unspecified hyperlipidemia 02/25/2013  . Preventative health care 04/20/2013  . Cerumen impaction 04/28/2013  . 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 Esophageal reflux 08/17/2013  . Acute pharyngitis 07/13/2014  . Restless leg syndrome 09/30/2014  . Gastroenteritis 12/21/2014  . Cataract 12/21/2014  . Chest pain   . Normal cardiac stress test   . Scabies 03/28/2015    Past Surgical History  Procedure Laterality Date  . Breast surgery  1992    biopsy, benign. Fibrocystic  . Abdominal hysterectomy      1992  . Umbilical hernia repair N/A 09/25/2012    Procedure: HERNIA REPAIR UMBILICAL ADULT;  Surgeon: Harl Bowie, MD;  Location: [facility];  Service: General;  Laterality: N/A;    Family History  Problem Relation Age of Onset  . Arthritis Mother   . Cancer Mother     ovarian  . Hypertension Mother   . Heart disease Mother     pacer  . Heart failure Mother   . Arthritis Father   . Cancer Father     lung  . Hypertension Father   . Cancer Sister     lung  .  Cancer Brother     prostate  . Cancer Brother     lung  . Hypertension Son   . COPD Brother   . Heart disease Brother   . Hypertension Brother   . Cancer Brother     colon  . Alcohol abuse Brother   . Cirrhosis Brother   . Seizures Sister   . Stroke Sister   . Arthritis Brother     Social History   Social History  . Marital Status: Widowed    Spouse Name: N/A  . Number of Children: 2  . Years of Education: N/A   Occupational History  . APL operator     [employer]   Social History Main Topics  . Smoking status: Former Smoker -- 3.50 packs/day for 20 years    Types: Cigarettes    Start date: 11/15/1970    Quit date: 07/31/1990  . Smokeless tobacco: Never Used     Comment: quit 23 years ago  . Alcohol Use: No  . Drug Use: No  . Sexual Activity: No   Other Topics Concern  . Not on file   Social History Narrative   Regular exercise: no   Caffeine Use: 2-3 weekly    Outpatient Prescriptions Prior to Visit  Medication Sig Dispense Refill  . ALPRAZolam (  XANAX) 1 MG tablet Take 1 tablet (1 mg total) by mouth 3 (three) times daily as needed. for anxiety 90 tablet 0  . aspirin EC 81 MG tablet Take 81 mg by mouth every morning.    . Calcium Carbonate-Vitamin D (CALTRATE COLON HEALTH PO) Take by mouth.    . citalopram (CELEXA) 40 MG tablet TAKE  ONE TABLET BY MOUTH EVERY MORNING 30 tablet 3  . Cranberry 300 MG tablet Take 300 mg by mouth 2 (two) times daily.    Marland Kitchen estradiol (ESTRACE) 0.5 MG tablet TAKE ONE TABLET BY MOUTH TWICE DAILY 180 tablet 1  . fluorometholone (FML) 0.1 % ophthalmic suspension Place 1 drop into both eyes daily as needed (For dry eyes.).     Marland Kitchen folic acid (FOLVITE) 1 MG tablet Take 1 mg by mouth daily.    . furosemide (LASIX) 20 MG tablet TAKE  ONE TABLET BY MOUTH EVERY MORNING 90 tablet 1  . hydroxyurea (HYDREA) 500 MG capsule Take 2 capsules (1,000 mg total) by mouth daily. 90 day supply. 90 capsule 3  . lactulose (CHRONULAC) 10 GM/15ML solution  Please take 2 tablespoons 3 times a day for constipation. 1000 mL 8  . mometasone (ELOCON) 0.1 % ointment Apply topically daily. 45 g 0  . montelukast (SINGULAIR) 10 MG tablet TAKE ONE TABLET BY MOUTH AT BEDTIME 30 tablet 5  . Multiple Vitamin (MULTIVITAMIN PO) Take 1 tablet by mouth every morning.     . naproxen (NAPROSYN) 500 MG tablet Take 1 tablet (500 mg total) by mouth 2 (two) times daily with a meal. 180 tablet 0  . Omega-3 Fatty Acids (FISH OIL) 1000 MG CAPS Take by mouth 2 (two) times daily.    Marland Kitchen omeprazole (PRILOSEC) 40 MG capsule TAKE 1 CAPSULE (40 MG TOTAL)     BY MOUTH 2 (TWO) TIMES DAILY. 60 capsule 6  . ondansetron (ZOFRAN) 8 MG tablet TAKE ONE TABLET BY MOUTH EVERY 8 HOURS AS NEEDED NAUSEA AND VOMITING 20 tablet 0  . pramipexole (MIRAPEX) 0.25 MG tablet TAKE ONE OR TWO TABLETS BY MOUTH AT BEDTIME AS NEEDED 60 tablet 1  . Probiotic Product (PROBIOTIC DAILY PO) Take by mouth 2 (two) times daily.    . ranitidine (ZANTAC) 300 MG tablet TAKE 1 TABLET BY MOUTH AT BEDTIME AS NEEDED FOR HEARTBURN 30 tablet 5  . venlafaxine XR (EFFEXOR-XR) 150 MG 24 hr capsule Take 1 capsule (150 mg total) by mouth daily with breakfast. 90 capsule 0  . venlafaxine XR (EFFEXOR-XR) 75 MG 24 hr capsule TAKE ONE CAPSULE BY MOUTH DAILY WITH BREAKFAST 90 capsule 0  . VENTOLIN HFA 108 (90 BASE) MCG/ACT inhaler INHALE 2 PUFFS INTO THE LUNGS EVERY 6 (SIX) HOURS AS  NEEDED FOR WHEEZI NG OR SHORTNESS OF BREATH. 18 g 8  . Vitamin D, Ergocalciferol, (DRISDOL) 50000 UNITS CAPS capsule TAKE 1 CAPSULE BY MOUTH EVERY OTHER WEEK 6 capsule 0  . acetaminophen-codeine (TYLENOL #3) 300-30 MG tablet TAKE 1 TABLET BY MOUTH EVERY 6 HOURS AS NEEDED FOR MODERATE TO SEVERE PAIN 90 tablet 0  . cyclobenzaprine (FLEXERIL) 5 MG tablet Take 1 tablet (5 mg total) by mouth at bedtime. Take 1 tablet 5 mg in the morning and 1 tablet at night time as needed for pain. 180 tablet 0  . HYDROcodone-acetaminophen (NORCO) 5-325 MG tablet 1 tab po bid  20 tablet 0  . levothyroxine (SYNTHROID, LEVOTHROID) 88 MCG tablet TAKE ONE TABLET BY MOUTH ONE TIME DAILY BEFORE BREAKFAST (Patient taking differently:  TAKE ON BY MOUTH DAILY, TAKE 2 ON SATURDAY.) 30 tablet 5  . loratadine (CLARITIN) 10 MG tablet TAKE 1 TABLET BY MOUTH ONCE A DAY AS NEEDED FOR ALLERGIES OR ITCHING 30 tablet 9  . permethrin (ACTICIN) 5 % cream Apply 1 application topically once. Apply to skin head to toe at bedtime and wash off in am, may repeat in 2 weeks once if new lesions continue to develop 60 g 1   No facility-administered medications prior to visit.    No Known Allergies  Review of Systems  Constitutional: Negative for fever and malaise/fatigue.  HENT: Negative for congestion.   Eyes: Negative for discharge.  Respiratory: Negative for shortness of breath.   Cardiovascular: Negative for chest pain, palpitations and leg swelling.  Gastrointestinal: Negative for nausea and abdominal pain.  Genitourinary: Negative for dysuria.  Musculoskeletal: Positive for back pain and joint pain. Negative for falls.  Skin: Negative for rash.  Neurological: Negative for loss of consciousness and headaches.  Endo/Heme/Allergies: Negative for environmental allergies.  Psychiatric/Behavioral: Positive for depression. The patient is not nervous/anxious.        Objective:    Physical Exam  Constitutional: She is oriented to person, place, and time. She appears well-developed and well-nourished. No distress.  HENT:  Head: Normocephalic and atraumatic.  Eyes: Conjunctivae are normal.  Neck: Neck supple. No thyromegaly present.  Cardiovascular: Normal rate, regular rhythm and normal heart sounds.   No murmur heard. Pulmonary/Chest: Effort normal and breath sounds normal. No respiratory distress.  Abdominal: Soft. Bowel sounds are normal. She exhibits no distension and no mass. There is no tenderness.  Musculoskeletal: She exhibits no edema.  Lymphadenopathy:    She has no  cervical adenopathy.  Neurological: She is alert and oriented to person, place, and time.  Skin: Skin is warm and dry.  Psychiatric: She has a normal mood and affect. Her behavior is normal.    BP 94/62 mmHg  Pulse 100  Temp(Src) 97.6 F (36.4 C) (Oral)  Ht '5\' 3"'$  (1.6 m)  Wt 124 lb (56.246 kg)  BMI 21.97 kg/m2  SpO2 97%  LMP 07/31/1990 Wt Readings from Last 3 Encounters:  09/16/15 124 lb (56.246 kg)  09/03/15 129 lb (58.514 kg)  08/05/15 124 lb (56.246 kg)     Lab Results  Component Value Date   WBC 3.2* 09/16/2015   HGB 10.9* 09/16/2015   HCT 32.8* 09/16/2015   PLT 427.0* 09/16/2015   GLUCOSE 81 09/16/2015   CHOL 191 09/16/2015   TRIG 172.0* 09/16/2015   HDL 51.60 09/16/2015   LDLCALC 105* 09/16/2015   ALT 11 09/16/2015   AST 16 09/16/2015   NA 139 09/16/2015   K 4.0 09/16/2015   CL 104 09/16/2015   CREATININE 0.66 09/16/2015   BUN 16 09/16/2015   CO2 33* 09/16/2015   TSH 0.25* 09/16/2015    Lab Results  Component Value Date   TSH 0.25* 09/16/2015   Lab Results  Component Value Date   WBC 3.2* 09/16/2015   HGB 10.9* 09/16/2015   HCT 32.8* 09/16/2015   MCV 122.5 Repeated and verified X2.* 09/16/2015   PLT 427.0* 09/16/2015   Lab Results  Component Value Date   NA 139 09/16/2015   K 4.0 09/16/2015   CHLORIDE 105 09/03/2015   CO2 33* 09/16/2015   GLUCOSE 81 09/16/2015   BUN 16 09/16/2015   CREATININE 0.66 09/16/2015   BILITOT 0.2 09/16/2015   ALKPHOS 57 09/16/2015   AST 16 09/16/2015   ALT  11 09/16/2015   PROT 7.0 09/16/2015   ALBUMIN 4.0 09/16/2015   CALCIUM 8.9 09/16/2015   ANIONGAP 10 09/03/2015   EGFR 87* 09/03/2015   GFR 95.95 09/16/2015   Lab Results  Component Value Date   CHOL 191 09/16/2015   Lab Results  Component Value Date   HDL 51.60 09/16/2015   Lab Results  Component Value Date   LDLCALC 105* 09/16/2015   Lab Results  Component Value Date   TRIG 172.0* 09/16/2015   Lab Results  Component Value Date   CHOLHDL 4  09/16/2015   No results found for: HGBA1C     Assessment & Plan:   Problem List Items Addressed This Visit    Decreased blood pressure, not hypotension   Relevant Orders   CBC (Completed)   Comprehensive metabolic panel (Completed)   Depression with anxiety    Has retired and with Venlafaxine feels she is doing well.      Esophageal reflux   Relevant Orders   CBC (Completed)   Comprehensive metabolic panel (Completed)   Essential thrombocythemia ([place])    Follows with hematology, doing well      Relevant Medications   HYDROcodone-acetaminophen (NORCO) 5-325 MG tablet   loratadine (CLARITIN) 10 MG tablet   Other Relevant Orders   CBC (Completed)   Comprehensive metabolic panel (Completed)   Hyperlipidemia   Relevant Orders   Lipid panel (Completed)   Hypothyroid - Primary    Did not increase the Synthyroid to the extra tab once a week. Will recheck tsh today      Relevant Orders   TSH (Completed)   Preventative health care    Patient encouraged to maintain heart healthy diet, regular exercise, adequate sleep. Consider daily probiotics. Take medications as prescribed.         I have discontinued Ms. Gust's permethrin, cyclobenzaprine, and acetaminophen-codeine. I have also changed her HYDROcodone-acetaminophen and loratadine. Additionally, I am having her start on fluticasone, Spacer/Aero-Holding Chambers, and cyclobenzaprine. Lastly, I am having her maintain her Multiple Vitamin (MULTIVITAMIN PO), fluorometholone, aspirin EC, Fish Oil, Probiotic Product (PROBIOTIC DAILY PO), folic acid, Calcium Carbonate-Vitamin D (CALTRATE COLON HEALTH PO), Cranberry, VENTOLIN HFA, venlafaxine XR, omeprazole, mometasone, ranitidine, lactulose, montelukast, citalopram, furosemide, Vitamin D (Ergocalciferol), estradiol, ondansetron, venlafaxine XR, ALPRAZolam, naproxen, pramipexole, and hydroxyurea.  Meds ordered this encounter  Medications  . HYDROcodone-acetaminophen (NORCO) 5-325  MG tablet    Sig: Take one tablet tab po tid.    Dispense:  90 tablet    Refill:  0  . fluticasone (FLOVENT HFA) 110 MCG/ACT inhaler    Sig: Inhale 2 puffs into the lungs 2 (two) times daily.    Dispense:  1 Inhaler    Refill:  12  . Spacer/Aero-Holding Chambers DEVI    Sig: 1 Dose by Does not apply route 2 (two) times daily as needed.    Dispense:  1 each    Refill:  0  . cyclobenzaprine (FLEXERIL) 10 MG tablet    Sig: Take 1 tablet (10 mg total) by mouth 2 (two) times daily as needed for muscle spasms.    Dispense:  60 tablet    Refill:  3  . loratadine (CLARITIN) 10 MG tablet    Sig: TAKE 1 TABLET BY MOUTH TWICE A DAY AS NEEDED FOR ALLERGIES OR ITCHING    Dispense:  60 tablet    Refill:  9     Penni Homans, MD

## 2015-09-17 ENCOUNTER — Other Ambulatory Visit: Payer: Self-pay | Admitting: Family Medicine

## 2015-09-17 MED ORDER — LEVOTHYROXINE SODIUM 88 MCG PO TABS: ORAL_TABLET | ORAL | Status: DC

## 2015-09-20 ENCOUNTER — Other Ambulatory Visit: Payer: Self-pay

## 2015-09-20 DIAGNOSIS — Z1231 Encounter for screening mammogram for malignant neoplasm of breast: Secondary | ICD-10-CM

## 2015-09-22 ENCOUNTER — Other Ambulatory Visit: Payer: Self-pay | Admitting: *Deleted

## 2015-09-26 NOTE — Assessment & Plan Note (Signed)
Patient encouraged to maintain heart healthy diet, regular exercise, adequate sleep. Consider daily probiotics. Take medications as prescribed 

## 2015-09-26 NOTE — Assessment & Plan Note (Signed)
Has retired and with Venlafaxine feels she is doing well.

## 2015-09-26 NOTE — Assessment & Plan Note (Signed)
Follows with hematology, doing well 

## 2015-09-30 ENCOUNTER — Other Ambulatory Visit: Payer: Self-pay | Admitting: Family Medicine

## 2015-09-30 MED ORDER — ALPRAZOLAM 1 MG PO TABS: 1.0000 mg | ORAL_TABLET | Freq: Three times a day (TID) | ORAL | Status: DC | PRN

## 2015-09-30 NOTE — Telephone Encounter (Signed)
Faxed hardcopy for Alprazolam to [facility] in [place] 

## 2015-09-30 NOTE — Telephone Encounter (Signed)
Requesting: Alprazolam Contract  08/17/2014 UDS  moderate Last OV  09/16/2015 Last Refill   #90 with 0 refills on 08/20/2015  Please Advise

## 2015-10-10 ENCOUNTER — Other Ambulatory Visit: Payer: Self-pay | Admitting: Hematology & Oncology

## 2015-10-14 ENCOUNTER — Ambulatory Visit (INDEPENDENT_AMBULATORY_CARE_PROVIDER_SITE_OTHER): Payer: BLUE CROSS/BLUE SHIELD | Admitting: Family Medicine

## 2015-10-14 ENCOUNTER — Encounter: Payer: Self-pay | Admitting: Family Medicine

## 2015-10-14 VITALS — BP 110/68 | HR 78 | Temp 98.5°F | Ht 63.0 in | Wt 129.5 lb

## 2015-10-14 DIAGNOSIS — E039 Hypothyroidism, unspecified: Secondary | ICD-10-CM

## 2015-10-14 DIAGNOSIS — F418 Other specified anxiety disorders: Secondary | ICD-10-CM

## 2015-10-14 DIAGNOSIS — E785 Hyperlipidemia, unspecified: Secondary | ICD-10-CM | POA: Diagnosis not present

## 2015-10-14 DIAGNOSIS — D539 Nutritional anemia, unspecified: Secondary | ICD-10-CM | POA: Diagnosis not present

## 2015-10-14 DIAGNOSIS — K5900 Constipation, unspecified: Secondary | ICD-10-CM | POA: Diagnosis not present

## 2015-10-14 DIAGNOSIS — T7840XD Allergy, unspecified, subsequent encounter: Secondary | ICD-10-CM

## 2015-10-14 MED ORDER — FLUTICASONE PROPIONATE 50 MCG/ACT NA SUSP: 2.0000 | Freq: Every day | NASAL | Status: DC

## 2015-10-14 MED ORDER — HYDROCODONE-ACETAMINOPHEN 5-325 MG PO TABS: ORAL_TABLET | ORAL | Status: DC

## 2015-10-14 MED ORDER — CYCLOBENZAPRINE HCL 10 MG PO TABS: 10.0000 mg | ORAL_TABLET | Freq: Two times a day (BID) | ORAL | Status: DC | PRN

## 2015-10-14 NOTE — Progress Notes (Signed)
Pre visit review using our clinic review tool, if applicable. No additional management support is needed unless otherwise documented below in the visit note. 

## 2015-10-14 NOTE — Assessment & Plan Note (Signed)
Doing well on Venlafaxine, service dog is also helpful

## 2015-10-14 NOTE — Assessment & Plan Note (Signed)
Mild, following with hematology

## 2015-10-14 NOTE — Progress Notes (Signed)
Patient ID: Rachael Johnson, female   DOB: [birth year], 65 y.o.   MRN: [id number]   Subjective:    Patient ID: Rachael Johnson, female    DOB: 04-05-1951, 65 y.o.   MRN: [id number]  Chief Complaint  Patient presents with  . Follow-up    HPI Patient is in today for follow up. Overall doing well, no recent illness. Nerve block in neck was not helpful. Does find the muscle relaxers helpful. Dr Francesco Runner did nerve blocks. Dr Carloyn Manner is working with her to try and decide if she needs surgery. Constipation is improved with Lactulose twice daily, on occasion 3 doses a day. Hydrocodone has let her sleep 5 straight hours and feels more rested and has less ongoing pain. Denies CP/palp/SOB/HA/congestion/fevers/GI or GU c/o. Taking meds as prescribed  Past Medical History  Diagnosis Date  . Arthritis   . Allergy   . Thyroid disease   . Diverticulitis   . Frequent episodic tension-type headache   . Hypertension   . Seizure ([place])     childhood  . Anemia, iron deficiency 07/08/2012  . Depression with anxiety 03/24/2011  . Depression   . GERD (gastroesophageal reflux disease)   . Essential thrombocythemia ([place]) 01/24/2011  . H. pylori infection 12/19/2012  . Other and unspecified hyperlipidemia 02/25/2013  . Preventative health care 04/20/2013  . Cerumen impaction 04/28/2013  . 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 Esophageal reflux 08/17/2013  . Acute pharyngitis 07/13/2014  . Restless leg syndrome 09/30/2014  . Gastroenteritis 12/21/2014  . Cataract 12/21/2014  . Chest pain   . Normal cardiac stress test   . Scabies 03/28/2015    Past Surgical History  Procedure Laterality Date  . Breast surgery  1992    biopsy, benign. Fibrocystic  . Abdominal hysterectomy      1992  . Umbilical hernia repair N/A 09/25/2012    Procedure: HERNIA REPAIR UMBILICAL ADULT;  Surgeon: Harl Bowie, MD;  Location: [facility];  Service: General;  Laterality: N/A;    Family History  Problem Relation Age of Onset  . Arthritis Mother     . Cancer Mother     ovarian  . Hypertension Mother   . Heart disease Mother     pacer  . Heart failure Mother   . Arthritis Father   . Cancer Father     lung  . Hypertension Father   . Cancer Sister     lung  . Cancer Brother     prostate  . Cancer Brother     lung  . Hypertension Son   . COPD Brother   . Heart disease Brother   . Hypertension Brother   . Cancer Brother     colon  . Alcohol abuse Brother   . Cirrhosis Brother   . Seizures Sister   . Stroke Sister   . Arthritis Brother     Social History   Social History  . Marital Status: Widowed    Spouse Name: N/A  . Number of Children: 2  . Years of Education: N/A   Occupational History  . APL operator     [employer]   Social History Main Topics  . Smoking status: Former Smoker -- 3.50 packs/day for 20 years    Types: Cigarettes    Start date: 11/15/1970    Quit date: 07/31/1990  . Smokeless tobacco: Never Used     Comment: quit 23 years ago  . Alcohol Use: No  . Drug Use: No  .  Sexual Activity: No   Other Topics Concern  . Not on file   Social History Narrative   Regular exercise: no   Caffeine Use: 2-3 weekly    Outpatient Prescriptions Prior to Visit  Medication Sig Dispense Refill  . ALPRAZolam (XANAX) 1 MG tablet Take 1 tablet (1 mg total) by mouth 3 (three) times daily as needed. for anxiety 90 tablet 0  . aspirin EC 81 MG tablet Take 81 mg by mouth every morning.    . Calcium Carbonate-Vitamin D (CALTRATE COLON HEALTH PO) Take by mouth.    . citalopram (CELEXA) 40 MG tablet TAKE  ONE TABLET BY MOUTH EVERY MORNING 30 tablet 3  . Cranberry 300 MG tablet Take 300 mg by mouth 2 (two) times daily.    Marland Kitchen estradiol (ESTRACE) 0.5 MG tablet TAKE ONE TABLET BY MOUTH TWICE DAILY 180 tablet 1  . fluorometholone (FML) 0.1 % ophthalmic suspension Place 1 drop into both eyes daily as needed (For dry eyes.).     Marland Kitchen fluticasone (FLOVENT HFA) 110 MCG/ACT inhaler Inhale 2 puffs into the lungs 2 (two) times  daily. 1 Inhaler 12  . folic acid (FOLVITE) 1 MG tablet Take 1 mg by mouth daily.    . furosemide (LASIX) 20 MG tablet TAKE  ONE TABLET BY MOUTH EVERY MORNING 90 tablet 1  . hydroxyurea (HYDREA) 500 MG capsule Take 2 capsules (1,000 mg total) by mouth daily. 90 day supply. 90 capsule 3  . lactulose (CHRONULAC) 10 GM/15ML solution Please take 2 tablespoons 3 times a day for constipation. 1000 mL 8  . levothyroxine (SYNTHROID, LEVOTHROID) 88 MCG tablet Take 1 tablet daily 30 tablet 5  . loratadine (CLARITIN) 10 MG tablet TAKE 1 TABLET BY MOUTH TWICE A DAY AS NEEDED FOR ALLERGIES OR ITCHING 60 tablet 9  . mometasone (ELOCON) 0.1 % ointment Apply topically daily. 45 g 0  . montelukast (SINGULAIR) 10 MG tablet TAKE ONE TABLET BY MOUTH AT BEDTIME 30 tablet 5  . Multiple Vitamin (MULTIVITAMIN PO) Take 1 tablet by mouth every morning.     . naproxen (NAPROSYN) 500 MG tablet Take 1 tablet (500 mg total) by mouth 2 (two) times daily with a meal. 180 tablet 0  . Omega-3 Fatty Acids (FISH OIL) 1000 MG CAPS Take by mouth 2 (two) times daily.    Marland Kitchen omeprazole (PRILOSEC) 40 MG capsule TAKE 1 CAPSULE (40 MG TOTAL)     BY MOUTH 2 (TWO) TIMES DAILY. 60 capsule 6  . ondansetron (ZOFRAN) 8 MG tablet TAKE ONE TABLET BY MOUTH EVERY 8 HOURS AS NEEDED NAUSEA AND VOMITING 20 tablet 0  . pramipexole (MIRAPEX) 0.25 MG tablet TAKE ONE OR TWO TABLETS BY MOUTH AT BEDTIME AS NEEDED 60 tablet 1  . Probiotic Product (PROBIOTIC DAILY PO) Take by mouth 2 (two) times daily.    . ranitidine (ZANTAC) 300 MG tablet TAKE 1 TABLET BY MOUTH AT BEDTIME AS NEEDED FOR HEARTBURN 30 tablet 5  . Spacer/Aero-Holding Chambers DEVI 1 Dose by Does not apply route 2 (two) times daily as needed. 1 each 0  . venlafaxine XR (EFFEXOR-XR) 150 MG 24 hr capsule Take 1 capsule (150 mg total) by mouth daily with breakfast. 90 capsule 0  . venlafaxine XR (EFFEXOR-XR) 75 MG 24 hr capsule TAKE ONE CAPSULE BY MOUTH DAILY WITH BREAKFAST 90 capsule 0  . VENTOLIN  HFA 108 (90 BASE) MCG/ACT inhaler INHALE 2 PUFFS INTO THE LUNGS EVERY 6 (SIX) HOURS AS  NEEDED FOR WHEEZI NG OR  SHORTNESS OF BREATH. 18 g 8  . Vitamin D, Ergocalciferol, (DRISDOL) 50000 UNITS CAPS capsule TAKE 1 CAPSULE BY MOUTH EVERY OTHER WEEK 6 capsule 0  . cyclobenzaprine (FLEXERIL) 10 MG tablet Take 1 tablet (10 mg total) by mouth 2 (two) times daily as needed for muscle spasms. 60 tablet 3  . HYDROcodone-acetaminophen (NORCO) 5-325 MG tablet Take one tablet tab po tid. 90 tablet 0   No facility-administered medications prior to visit.    No Known Allergies  Review of Systems  Constitutional: Negative for fever and malaise/fatigue.  HENT: Negative for congestion.   Eyes: Negative for blurred vision.  Respiratory: Negative for shortness of breath.   Cardiovascular: Negative for chest pain, palpitations and leg swelling.  Gastrointestinal: Negative for nausea, abdominal pain and blood in stool.  Genitourinary: Negative for dysuria and frequency.  Musculoskeletal: Negative for falls.  Skin: Negative for rash.  Neurological: Negative for dizziness, loss of consciousness and headaches.  Endo/Heme/Allergies: Negative for environmental allergies.  Psychiatric/Behavioral: Negative for depression. The patient is not nervous/anxious.        Objective:    Physical Exam  Constitutional: She is oriented to person, place, and time. She appears well-developed and well-nourished. No distress.  HENT:  Head: Normocephalic and atraumatic.  Nose: Nose normal.  Eyes: Right eye exhibits no discharge. Left eye exhibits no discharge.  Neck: Normal range of motion. Neck supple.  Cardiovascular: Normal rate and regular rhythm.   No murmur heard. Pulmonary/Chest: Effort normal and breath sounds normal.  Abdominal: Soft. Bowel sounds are normal. There is no tenderness.  Musculoskeletal: She exhibits no edema.  Neurological: She is alert and oriented to person, place, and time.  Skin: Skin is warm  and dry.  Psychiatric: She has a normal mood and affect.  Nursing note and vitals reviewed.   BP 110/68 mmHg  Pulse 78  Temp(Src) 98.5 F (36.9 C) (Oral)  Ht '5\' 3"'  (1.6 m)  Wt 129 lb 8 oz (58.741 kg)  BMI 22.95 kg/m2  SpO2 94%  LMP 07/31/1990 Wt Readings from Last 3 Encounters:  10/14/15 129 lb 8 oz (58.741 kg)  09/16/15 124 lb (56.246 kg)  09/03/15 129 lb (58.514 kg)     Lab Results  Component Value Date   WBC 3.2* 09/16/2015   HGB 10.9* 09/16/2015   HCT 32.8* 09/16/2015   PLT 427.0* 09/16/2015   GLUCOSE 81 09/16/2015   CHOL 191 09/16/2015   TRIG 172.0* 09/16/2015   HDL 51.60 09/16/2015   LDLCALC 105* 09/16/2015   ALT 11 09/16/2015   AST 16 09/16/2015   NA 139 09/16/2015   K 4.0 09/16/2015   CL 104 09/16/2015   CREATININE 0.66 09/16/2015   BUN 16 09/16/2015   CO2 33* 09/16/2015   TSH 0.25* 09/16/2015    Lab Results  Component Value Date   TSH 0.25* 09/16/2015   Lab Results  Component Value Date   WBC 3.2* 09/16/2015   HGB 10.9* 09/16/2015   HCT 32.8* 09/16/2015   MCV 122.5 Repeated and verified X2.* 09/16/2015   PLT 427.0* 09/16/2015   Lab Results  Component Value Date   NA 139 09/16/2015   K 4.0 09/16/2015   CHLORIDE 105 09/03/2015   CO2 33* 09/16/2015   GLUCOSE 81 09/16/2015   BUN 16 09/16/2015   CREATININE 0.66 09/16/2015   BILITOT 0.2 09/16/2015   ALKPHOS 57 09/16/2015   AST 16 09/16/2015   ALT 11 09/16/2015   PROT 7.0 09/16/2015   ALBUMIN 4.0 09/16/2015  CALCIUM 8.9 09/16/2015   ANIONGAP 10 09/03/2015   EGFR 87* 09/03/2015   GFR 95.95 09/16/2015   Lab Results  Component Value Date   CHOL 191 09/16/2015   Lab Results  Component Value Date   HDL 51.60 09/16/2015   Lab Results  Component Value Date   LDLCALC 105* 09/16/2015   Lab Results  Component Value Date   TRIG 172.0* 09/16/2015   Lab Results  Component Value Date   CHOLHDL 4 09/16/2015   No results found for: HGBA1C     Assessment & Plan:   Problem List  Items Addressed This Visit    Allergic state    Will add Flonase      Constipation    Doing better, may use Lactulose      Depression with anxiety    Doing well on Venlafaxine, service dog is also helpful      Hyperlipidemia - Primary    Well controlled, no changes to meds. Encouraged heart healthy diet such as the DASH diet and exercise as tolerated.       Hypothyroid    On Levothyroxine, continue to monitor. TSH mildly suppressed at last check recheck in 8 weeks      Macrocytic anemia    Mild, following with hematology         I am having Ms. Bibbins start on fluticasone. I am also having her maintain her Multiple Vitamin (MULTIVITAMIN PO), fluorometholone, aspirin EC, Fish Oil, Probiotic Product (PROBIOTIC DAILY PO), folic acid, Calcium Carbonate-Vitamin D (CALTRATE COLON HEALTH PO), Cranberry, VENTOLIN HFA, venlafaxine XR, omeprazole, mometasone, ranitidine, lactulose, montelukast, citalopram, furosemide, Vitamin D (Ergocalciferol), estradiol, venlafaxine XR, naproxen, pramipexole, hydroxyurea, fluticasone, Spacer/Aero-Holding Chambers, loratadine, levothyroxine, ALPRAZolam, ondansetron, HYDROcodone-acetaminophen, and cyclobenzaprine.  Meds ordered this encounter  Medications  . HYDROcodone-acetaminophen (NORCO) 5-325 MG tablet    Sig: Take one tablet tab po tid.    Dispense:  90 tablet    Refill:  0  . cyclobenzaprine (FLEXERIL) 10 MG tablet    Sig: Take 1 tablet (10 mg total) by mouth 2 (two) times daily as needed for muscle spasms.    Dispense:  60 tablet    Refill:  3  . fluticasone (FLONASE) 50 MCG/ACT nasal spray    Sig: Place 2 sprays into both nostrils daily.    Dispense:  16 g    Refill:  6     Penni Homans, MD

## 2015-10-14 NOTE — Assessment & Plan Note (Signed)
Well controlled, no changes to meds. Encouraged heart healthy diet such as the DASH diet and exercise as tolerated.  °

## 2015-10-14 NOTE — Assessment & Plan Note (Signed)
Doing better, may use Lactulose

## 2015-10-14 NOTE — Patient Instructions (Signed)

## 2015-10-14 NOTE — Assessment & Plan Note (Signed)
On Levothyroxine, continue to monitor. TSH mildly suppressed at last check recheck in 8 weeks

## 2015-10-14 NOTE — Assessment & Plan Note (Signed)
Will add Flonase

## 2015-10-30 ENCOUNTER — Other Ambulatory Visit: Payer: Self-pay | Admitting: Family Medicine

## 2015-11-01 NOTE — Telephone Encounter (Signed)
Faxed hardcopy for Alprazolam to [facility] 

## 2015-11-02 ENCOUNTER — Ambulatory Visit
Admission: RE | Admit: 2015-11-02 | Discharge: 2015-11-02 | Disposition: A | Discharge status: Home / Self Care | Payer: BLUE CROSS/BLUE SHIELD | Source: Ambulatory Visit

## 2015-11-02 DIAGNOSIS — Z1231 Encounter for screening mammogram for malignant neoplasm of breast: Secondary | ICD-10-CM

## 2015-11-04 ENCOUNTER — Other Ambulatory Visit: Payer: Self-pay | Admitting: Family Medicine

## 2015-11-12 ENCOUNTER — Other Ambulatory Visit: Payer: Self-pay | Admitting: Hematology & Oncology

## 2015-11-12 ENCOUNTER — Other Ambulatory Visit: Payer: Self-pay | Admitting: Family Medicine

## 2015-11-15 ENCOUNTER — Other Ambulatory Visit: Payer: Self-pay | Admitting: Family Medicine

## 2015-11-15 MED ORDER — VENLAFAXINE HCL ER 75 MG PO CP24: ORAL_CAPSULE | ORAL | Status: DC

## 2015-11-15 MED ORDER — VENLAFAXINE HCL ER 150 MG PO CP24: 150.0000 mg | ORAL_CAPSULE | Freq: Every day | ORAL | Status: DC

## 2015-12-01 ENCOUNTER — Other Ambulatory Visit: Payer: Self-pay | Admitting: Hematology & Oncology

## 2015-12-01 ENCOUNTER — Ambulatory Visit: Payer: BLUE CROSS/BLUE SHIELD | Admitting: Hematology & Oncology

## 2015-12-01 ENCOUNTER — Other Ambulatory Visit: Payer: BLUE CROSS/BLUE SHIELD

## 2015-12-03 ENCOUNTER — Ambulatory Visit (HOSPITAL_BASED_OUTPATIENT_CLINIC_OR_DEPARTMENT_OTHER): Payer: BLUE CROSS/BLUE SHIELD | Admitting: Hematology & Oncology

## 2015-12-03 ENCOUNTER — Other Ambulatory Visit (HOSPITAL_BASED_OUTPATIENT_CLINIC_OR_DEPARTMENT_OTHER): Payer: BLUE CROSS/BLUE SHIELD

## 2015-12-03 ENCOUNTER — Other Ambulatory Visit: Payer: Self-pay | Admitting: Family Medicine

## 2015-12-03 ENCOUNTER — Encounter: Payer: Self-pay | Admitting: Hematology & Oncology

## 2015-12-03 VITALS — BP 134/77 | HR 77 | Temp 97.7°F | Resp 16 | Ht 63.0 in | Wt 123.0 lb

## 2015-12-03 DIAGNOSIS — D473 Essential (hemorrhagic) thrombocythemia: Secondary | ICD-10-CM | POA: Diagnosis not present

## 2015-12-03 DIAGNOSIS — R634 Abnormal weight loss: Secondary | ICD-10-CM

## 2015-12-03 DIAGNOSIS — R112 Nausea with vomiting, unspecified: Secondary | ICD-10-CM

## 2015-12-03 LAB — CBC WITH DIFFERENTIAL (CANCER CENTER ONLY)
BASO#: 0 10*3/uL (ref 0.0–0.2)
BASO%: 0.6 % (ref 0.0–2.0)
EOS%: 1.7 % (ref 0.0–7.0)
Eosinophils Absolute: 0.1 10*3/uL (ref 0.0–0.5)
HCT: 30.4 % — ABNORMAL LOW (ref 34.8–46.6)
HGB: 10.1 g/dL — ABNORMAL LOW (ref 11.6–15.9)
LYMPH#: 0.8 10*3/uL — ABNORMAL LOW (ref 0.9–3.3)
LYMPH%: 21.9 % (ref 14.0–48.0)
MCH: 42.6 pg — ABNORMAL HIGH (ref 26.0–34.0)
MCHC: 33.2 g/dL (ref 32.0–36.0)
MCV: 128 fL — ABNORMAL HIGH (ref 81–101)
MONO#: 0.2 10*3/uL (ref 0.1–0.9)
MONO%: 4.8 % (ref 0.0–13.0)
NEUT#: 2.5 10*3/uL (ref 1.5–6.5)
NEUT%: 71 % (ref 39.6–80.0)
Platelets: 357 10*3/uL (ref 145–400)
RBC: 2.37 10*6/uL — ABNORMAL LOW (ref 3.70–5.32)
RDW: 12.9 % (ref 11.1–15.7)
WBC: 3.5 10*3/uL — ABNORMAL LOW (ref 3.9–10.0)

## 2015-12-03 LAB — COMPREHENSIVE METABOLIC PANEL
ALT: 10 U/L (ref 0–55)
AST: 15 U/L (ref 5–34)
Albumin: 3.2 g/dL — ABNORMAL LOW (ref 3.5–5.0)
Alkaline Phosphatase: 69 U/L (ref 40–150)
Anion Gap: 7 mEq/L (ref 3–11)
BUN: 10.8 mg/dL (ref 7.0–26.0)
CO2: 28 mEq/L (ref 22–29)
Calcium: 9.1 mg/dL (ref 8.4–10.4)
Chloride: 105 mEq/L (ref 98–109)
Creatinine: 0.7 mg/dL (ref 0.6–1.1)
EGFR: 87 mL/min/{1.73_m2} — ABNORMAL LOW (ref >90)
Glucose: 117 mg/dl (ref 70–140)
Potassium: 3.8 mEq/L (ref 3.5–5.1)
Sodium: 140 mEq/L (ref 136–145)
Total Bilirubin: <0.3 mg/dL (ref 0.20–1.20)
Total Protein: 6.6 g/dL (ref 6.4–8.3)

## 2015-12-03 LAB — LACTATE DEHYDROGENASE: LDH: 170 U/L (ref 125–245)

## 2015-12-03 MED ORDER — ONDANSETRON HCL 8 MG PO TABS
ORAL_TABLET | ORAL | Status: DC
Start: 2015-12-03 — End: 2017-02-12

## 2015-12-03 MED ORDER — RANITIDINE HCL 300 MG PO TABS
ORAL_TABLET | ORAL | Status: DC
Start: 2015-12-03 — End: 2016-07-02
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
## 2015-12-09 ENCOUNTER — Other Ambulatory Visit: Payer: Self-pay | Admitting: Family Medicine

## 2015-12-09 MED ORDER — CITALOPRAM HYDROBROMIDE 40 MG PO TABS: ORAL_TABLET | ORAL | Status: DC

## 2015-12-17 ENCOUNTER — Encounter: Payer: Self-pay | Admitting: Family Medicine

## 2015-12-17 ENCOUNTER — Ambulatory Visit (INDEPENDENT_AMBULATORY_CARE_PROVIDER_SITE_OTHER): Payer: BLUE CROSS/BLUE SHIELD | Admitting: Family Medicine

## 2015-12-17 VITALS — BP 110/62 | HR 90 | Temp 98.0°F | Ht 63.0 in | Wt 126.1 lb

## 2015-12-17 DIAGNOSIS — E039 Hypothyroidism, unspecified: Secondary | ICD-10-CM | POA: Diagnosis not present

## 2015-12-17 DIAGNOSIS — R300 Dysuria: Secondary | ICD-10-CM

## 2015-12-17 DIAGNOSIS — E785 Hyperlipidemia, unspecified: Secondary | ICD-10-CM

## 2015-12-17 DIAGNOSIS — N3000 Acute cystitis without hematuria: Secondary | ICD-10-CM

## 2015-12-17 DIAGNOSIS — R739 Hyperglycemia, unspecified: Secondary | ICD-10-CM

## 2015-12-17 DIAGNOSIS — K219 Gastro-esophageal reflux disease without esophagitis: Secondary | ICD-10-CM

## 2015-12-17 DIAGNOSIS — K5900 Constipation, unspecified: Secondary | ICD-10-CM

## 2015-12-17 DIAGNOSIS — D473 Essential (hemorrhagic) thrombocythemia: Secondary | ICD-10-CM | POA: Diagnosis not present

## 2015-12-17 HISTORY — DX: Hyperglycemia, unspecified: R73.9

## 2015-12-17 LAB — URINALYSIS
Bilirubin Urine: NEGATIVE
Hgb urine dipstick: NEGATIVE
Leukocytes, UA: NEGATIVE
Nitrite: NEGATIVE
Specific Gravity, Urine: 1.025 (ref 1.000–1.030)
Total Protein, Urine: NEGATIVE
Urine Glucose: NEGATIVE
Urobilinogen, UA: 0.2 (ref 0.0–1.0)
pH: 5.5 (ref 5.0–8.0)

## 2015-12-17 LAB — COMPREHENSIVE METABOLIC PANEL
ALT: 8 U/L (ref 0–35)
AST: 14 U/L (ref 0–37)
Albumin: 3.7 g/dL (ref 3.5–5.2)
Alkaline Phosphatase: 73 U/L (ref 39–117)
BUN: 16 mg/dL (ref 6–23)
CO2: 29 mEq/L (ref 19–32)
Calcium: 8.9 mg/dL (ref 8.4–10.5)
Chloride: 106 mEq/L (ref 96–112)
Creatinine, Ser: 0.68 mg/dL (ref 0.40–1.20)
GFR: 92.62 mL/min (ref >60.00)
Glucose, Bld: 68 mg/dL — ABNORMAL LOW (ref 70–99)
Potassium: 3.7 mEq/L (ref 3.5–5.1)
Sodium: 141 mEq/L (ref 135–145)
Total Bilirubin: 0.2 mg/dL (ref 0.2–1.2)
Total Protein: 6.7 g/dL (ref 6.0–8.3)

## 2015-12-17 LAB — LIPID PANEL
Cholesterol: 164 mg/dL (ref 0–200)
HDL: 41.1 mg/dL (ref >39.00)
LDL Cholesterol: 96 mg/dL (ref 0–99)
NonHDL: 122.4
Total CHOL/HDL Ratio: 4
Triglycerides: 134 mg/dL (ref 0.0–149.0)
VLDL: 26.8 mg/dL (ref 0.0–40.0)

## 2015-12-17 LAB — HEMOGLOBIN A1C: Hgb A1c MFr Bld: 5.4 % (ref 4.6–6.5)

## 2015-12-17 LAB — TSH: TSH: 16.73 u[IU]/mL — ABNORMAL HIGH (ref 0.35–4.50)

## 2015-12-17 MED ORDER — HYDROCODONE-ACETAMINOPHEN 5-325 MG PO TABS: 1.0000 | ORAL_TABLET | Freq: Four times a day (QID) | ORAL | Status: DC | PRN

## 2015-12-17 MED ORDER — HYDROCODONE-ACETAMINOPHEN 5-325 MG PO TABS: ORAL_TABLET | ORAL | Status: DC

## 2015-12-17 MED ORDER — SULFAMETHOXAZOLE-TRIMETHOPRIM 800-160 MG PO TABS: 1.0000 | ORAL_TABLET | Freq: Two times a day (BID) | ORAL | Status: DC

## 2015-12-17 NOTE — Assessment & Plan Note (Signed)
Encouraged heart healthy diet, increase exercise, avoid trans fats, consider a krill oil cap daily 

## 2015-12-17 NOTE — Patient Instructions (Signed)

## 2015-12-17 NOTE — Assessment & Plan Note (Signed)
Feeling better today but will check UA and c&s

## 2015-12-17 NOTE — Assessment & Plan Note (Signed)
minimize simple carbs. Increase exercise as tolerated.  

## 2015-12-17 NOTE — Progress Notes (Signed)
Pre visit review using our clinic review tool, if applicable. No additional management support is needed unless otherwise documented below in the visit note. 

## 2015-12-17 NOTE — Assessment & Plan Note (Signed)
Doing better with 2 tbls of Lactulose daily

## 2015-12-17 NOTE — Progress Notes (Signed)
Patient ID: Rachael Johnson, female   DOB: June 13, 1951, 65 y.o.   MRN: [id number]   Subjective:    Patient ID: Rachael Johnson, female    DOB: 02-Feb-1951, 65 y.o.   MRN: [id number]  Chief Complaint  Patient presents with  . Follow-up    HPI Patient is in today for follow up. She has been struggling with malaise, fatigue, fevers, chills, congestion and dysuria off and on for several weeks. Today the dysuria is improved after using OTC Azo but she still notes the other symptoms occuring off and on. Encouraged increased hydration, 64 ounces of clear fluids daily. Minimize alcohol and caffeine. Eat small frequent meals with lean proteins and complex carbs. Avoid high and low blood sugars. Get adequate sleep, 7-8 hours a night. Needs exercise daily preferably in the morning. Denies CP/palp/SOB/HA/GI or GU c/o. Taking meds as prescribed  Past Medical History  Diagnosis Date  . Arthritis   . Allergy   . Thyroid disease   . Diverticulitis   . Frequent episodic tension-type headache   . Hypertension   . Seizure ([place])     childhood  . Anemia, iron deficiency 07/08/2012  . Depression with anxiety 03/24/2011  . Depression   . GERD (gastroesophageal reflux disease)   . Essential thrombocythemia ([place]) 01/24/2011  . H. pylori infection 12/19/2012  . Other and unspecified hyperlipidemia 02/25/2013  . Preventative health care 04/20/2013  . Cerumen impaction 04/28/2013  . 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 Esophageal reflux 08/17/2013  . Acute pharyngitis 07/13/2014  . Restless leg syndrome 09/30/2014  . Gastroenteritis 12/21/2014  . Cataract 12/21/2014  . Chest pain   . Normal cardiac stress test   . Scabies 03/28/2015  . Hyperglycemia 12/17/2015  . Hyperglycemia 12/17/2015    Past Surgical History  Procedure Laterality Date  . Breast surgery  1992    biopsy, benign. Fibrocystic  . Abdominal hysterectomy      1992  . Umbilical hernia repair N/A 09/25/2012    Procedure: HERNIA REPAIR UMBILICAL ADULT;  Surgeon:  Harl Bowie, MD;  Location: [facility];  Service: General;  Laterality: N/A;    Family History  Problem Relation Age of Onset  . Arthritis Mother   . Cancer Mother     ovarian  . Hypertension Mother   . Heart disease Mother     pacer  . Heart failure Mother   . Arthritis Father   . Cancer Father     lung  . Hypertension Father   . Cancer Sister     lung  . Cancer Brother     prostate  . Cancer Brother     lung  . Hypertension Son   . COPD Brother   . Heart disease Brother   . Hypertension Brother   . Cancer Brother     colon  . Alcohol abuse Brother   . Cirrhosis Brother   . Seizures Sister   . Stroke Sister   . Arthritis Brother     Social History   Social History  . Marital Status: Widowed    Spouse Name: N/A  . Number of Children: 2  . Years of Education: N/A   Occupational History  . APL operator     [employer]   Social History Main Topics  . Smoking status: Former Smoker -- 3.50 packs/day for 20 years    Types: Cigarettes    Start date: 11/15/1970    Quit date: 07/31/1990  . Smokeless tobacco: Never Used  Comment: quit 23 years ago  . Alcohol Use: No  . Drug Use: No  . Sexual Activity: No   Other Topics Concern  . Not on file   Social History Narrative   Regular exercise: no   Caffeine Use: 2-3 weekly    Outpatient Prescriptions Prior to Visit  Medication Sig Dispense Refill  . ALPRAZolam (XANAX) 1 MG tablet TAKE 1 TABLET THREE TIMES A DAY AS NEEDED FOR ANXIETY 90 tablet 0  . aspirin EC 81 MG tablet Take 81 mg by mouth every morning.    . Calcium Carbonate-Vitamin D (CALTRATE COLON HEALTH PO) Take by mouth.    . citalopram (CELEXA) 40 MG tablet TAKE  ONE TABLET BY MOUTH EVERY MORNING 30 tablet 3  . Cranberry 300 MG tablet Take 300 mg by mouth 2 (two) times daily.    . cyclobenzaprine (FLEXERIL) 10 MG tablet Take 1 tablet (10 mg total) by mouth 2 (two) times daily as needed for muscle spasms. 60 tablet 3  . estradiol (ESTRACE) 0.5  MG tablet TAKE ONE TABLET BY MOUTH TWICE DAILY 180 tablet 1  . fluorometholone (FML) 0.1 % ophthalmic suspension Place 1 drop into both eyes daily as needed (For dry eyes.).     Marland Kitchen fluticasone (FLONASE) 50 MCG/ACT nasal spray Place 2 sprays into both nostrils daily. 16 g 6  . fluticasone (FLOVENT HFA) 110 MCG/ACT inhaler Inhale 2 puffs into the lungs 2 (two) times daily. 1 Inhaler 12  . folic acid (FOLVITE) 1 MG tablet Take 1 mg by mouth daily.    . furosemide (LASIX) 20 MG tablet TAKE  ONE TABLET BY MOUTH EVERY MORNING 90 tablet 1  . hydroxyurea (HYDREA) 500 MG capsule Take 2 capsules (1,000 mg total) by mouth daily. 90 day supply. 90 capsule 3  . lactulose (CHRONULAC) 10 GM/15ML solution Please take 2 tablespoons 3 times a day for constipation. 1000 mL 8  . levothyroxine (SYNTHROID, LEVOTHROID) 88 MCG tablet Take 1 tablet daily 30 tablet 5  . loratadine (CLARITIN) 10 MG tablet TAKE 1 TABLET BY MOUTH TWICE A DAY AS NEEDED FOR ALLERGIES OR ITCHING 60 tablet 9  . mometasone (ELOCON) 0.1 % ointment Apply topically daily. 45 g 0  . montelukast (SINGULAIR) 10 MG tablet TAKE ONE TABLET BY MOUTH AT BEDTIME 30 tablet 5  . Multiple Vitamin (MULTIVITAMIN PO) Take 1 tablet by mouth every morning.     . naproxen (NAPROSYN) 500 MG tablet TAKE 1 TABLET (500 MG TOTAL) BY MOUTH 2 (TWO) TIMES DAILY WITH A MEAL. 180 tablet 0  . Omega-3 Fatty Acids (FISH OIL) 1000 MG CAPS Take by mouth 2 (two) times daily.    Marland Kitchen omeprazole (PRILOSEC) 40 MG capsule TAKE 1 CAPSULE (40 MG TOTAL)     BY MOUTH 2 (TWO) TIMES DAILY. 60 capsule 6  . ondansetron (ZOFRAN) 8 MG tablet TAKE ONE TABLET BY MOUTH EVERY 8 HOURS AS NEEDED NAUSEA AND VOMITING 20 tablet 0  . pramipexole (MIRAPEX) 0.25 MG tablet TAKE ONE OR TWO TABLETS BY MOUTH AT BEDTIME AS NEEDED 60 tablet 1  . Probiotic Product (PROBIOTIC DAILY PO) Take by mouth 2 (two) times daily.    . ranitidine (ZANTAC) 300 MG tablet TAKE 1 TABLET BY MOUTH AT BEDTIME AS NEEDED FOR HEARTBURN 30  tablet 5  . RESTASIS 0.05 % ophthalmic emulsion   3  . Spacer/Aero-Holding Chambers DEVI 1 Dose by Does not apply route 2 (two) times daily as needed. 1 each 0  . venlafaxine XR (  EFFEXOR-XR) 150 MG 24 hr capsule Take 1 capsule (150 mg total) by mouth daily with breakfast. 90 capsule 1  . venlafaxine XR (EFFEXOR-XR) 75 MG 24 hr capsule TAKE ONE CAPSULE BY MOUTH DAILY WITH BREAKFAST 90 capsule 1  . VENTOLIN HFA 108 (90 BASE) MCG/ACT inhaler INHALE 2 PUFFS INTO THE LUNGS EVERY 6 (SIX) HOURS AS  NEEDED FOR WHEEZI NG OR SHORTNESS OF BREATH. 18 g 8  . Vitamin D, Ergocalciferol, (DRISDOL) 50000 units CAPS capsule TAKE 1 CAPSULE BY MOUTH EVERY OTHER WEEK 6 capsule 0  . HYDROcodone-acetaminophen (NORCO) 5-325 MG tablet Take one tablet tab po tid. 90 tablet 0   No facility-administered medications prior to visit.    No Known Allergies  Review of Systems  Constitutional: Positive for malaise/fatigue. Negative for fever.  HENT: Positive for congestion.   Eyes: Negative for blurred vision.  Respiratory: Negative for shortness of breath.   Cardiovascular: Negative for chest pain, palpitations and leg swelling.  Gastrointestinal: Negative for nausea, abdominal pain and blood in stool.  Genitourinary: Positive for dysuria. Negative for frequency.  Musculoskeletal: Negative for falls.  Skin: Negative for rash.  Neurological: Negative for dizziness, loss of consciousness and headaches.  Endo/Heme/Allergies: Negative for environmental allergies.  Psychiatric/Behavioral: Negative for depression. The patient is not nervous/anxious.        Objective:    Physical Exam  Constitutional: She is oriented to person, place, and time. She appears well-developed and well-nourished. No distress.  HENT:  Head: Normocephalic and atraumatic.  Nose: Nose normal.  Eyes: Right eye exhibits no discharge. Left eye exhibits no discharge.  Neck: Normal range of motion. Neck supple.  Cardiovascular: Normal rate and  regular rhythm.   No murmur heard. Pulmonary/Chest: Effort normal and breath sounds normal.  Abdominal: Soft. Bowel sounds are normal. There is no tenderness.  Musculoskeletal: She exhibits no edema.  Neurological: She is alert and oriented to person, place, and time.  Skin: Skin is warm and dry.  Psychiatric: She has a normal mood and affect.  Nursing note and vitals reviewed.   BP 110/62 mmHg  Pulse 90  Temp(Src) 98 F (36.7 C) (Oral)  Ht _0  (1.6 m)  Wt 126 lb 2 oz (57.21 kg)  BMI 22.35 kg/m2  SpO2 96%  LMP 07/31/1990 Wt Readings from Last 3 Encounters:  12/17/15 126 lb 2 oz (57.21 kg)  12/03/15 123 lb (55.792 kg)  10/14/15 129 lb 8 oz (58.741 kg)     Lab Results  Component Value Date   WBC 3.5* 12/03/2015   HGB 10.1* 12/03/2015   HCT 30.4* 12/03/2015   PLT 357 12/03/2015   GLUCOSE 117 12/03/2015   CHOL 191 09/16/2015   TRIG 172.0* 09/16/2015   HDL 51.60 09/16/2015   LDLCALC 105* 09/16/2015   ALT 10 12/03/2015   AST 15 12/03/2015   NA 140 12/03/2015   K 3.8 12/03/2015   CL 104 09/16/2015   CREATININE 0.7 12/03/2015   BUN 10.8 12/03/2015   CO2 28 12/03/2015   TSH 0.25* 09/16/2015    Lab Results  Component Value Date   TSH 0.25* 09/16/2015   Lab Results  Component Value Date   WBC 3.5* 12/03/2015   HGB 10.1* 12/03/2015   HCT 30.4* 12/03/2015   MCV 128* 12/03/2015   PLT 357 12/03/2015   Lab Results  Component Value Date   NA 140 12/03/2015   K 3.8 12/03/2015   CHLORIDE 105 12/03/2015   CO2 28 12/03/2015   GLUCOSE 117 12/03/2015  BUN 10.8 12/03/2015   CREATININE 0.7 12/03/2015   BILITOT <0.30 12/03/2015   ALKPHOS 69 12/03/2015   AST 15 12/03/2015   ALT 10 12/03/2015   PROT 6.6 12/03/2015   ALBUMIN 3.2* 12/03/2015   CALCIUM 9.1 12/03/2015   ANIONGAP 7 12/03/2015   EGFR 87* 12/03/2015   GFR 95.95 09/16/2015   Lab Results  Component Value Date   CHOL 191 09/16/2015   Lab Results  Component Value Date   HDL 51.60 09/16/2015   Lab  Results  Component Value Date   LDLCALC 105* 09/16/2015   Lab Results  Component Value Date   TRIG 172.0* 09/16/2015   Lab Results  Component Value Date   CHOLHDL 4 09/16/2015   No results found for: HGBA1C     Assessment & Plan:   Problem List Items Addressed This Visit    UTI (urinary tract infection)    Feeling better today but will check UA and c&s      Relevant Medications   sulfamethoxazole-trimethoprim (BACTRIM DS,SEPTRA DS) 800-160 MG tablet   Other Relevant Orders   TSH   Hemoglobin A1c   Lipid panel   Comprehensive metabolic panel   Hypothyroid   Relevant Orders   TSH   Hemoglobin A1c   Lipid panel   Comprehensive metabolic panel   Hyperlipidemia    Encouraged heart healthy diet, increase exercise, avoid trans fats, consider a krill oil cap daily      Relevant Orders   TSH   Hemoglobin A1c   Lipid panel   Comprehensive metabolic panel   Hyperglycemia    minimize simple carbs. Increase exercise as tolerated.       Essential thrombocythemia ([place])   Relevant Medications   HYDROcodone-acetaminophen (NORCO) 5-325 MG tablet   Other Relevant Orders   TSH   Hemoglobin A1c   Lipid panel   Comprehensive metabolic panel   Esophageal reflux   Relevant Orders   TSH   Hemoglobin A1c   Lipid panel   Comprehensive metabolic panel   Constipation    Doing better with 2 tbls of Lactulose daily       Other Visit Diagnoses    Dysuria    -  Primary    Relevant Medications    HYDROcodone-acetaminophen (NORCO) 5-325 MG tablet    sulfamethoxazole-trimethoprim (BACTRIM DS,SEPTRA DS) 800-160 MG tablet    Other Relevant Orders    Urinalysis    Urine culture    TSH    Hemoglobin A1c    Lipid panel    Comprehensive metabolic panel       I have discontinued Rachael Johnson's HYDROcodone-acetaminophen. I have also changed her HYDROcodone-acetaminophen. Additionally, I am having her start on sulfamethoxazole-trimethoprim. Lastly, I am having her maintain her  Multiple Vitamin (MULTIVITAMIN PO), fluorometholone, aspirin EC, Fish Oil, Probiotic Product (PROBIOTIC DAILY PO), folic acid, Calcium Carbonate-Vitamin D (CALTRATE COLON HEALTH PO), Cranberry, VENTOLIN HFA, omeprazole, mometasone, lactulose, montelukast, furosemide, estradiol, hydroxyurea, fluticasone, Spacer/Aero-Holding Chambers, loratadine, levothyroxine, cyclobenzaprine, fluticasone, ALPRAZolam, pramipexole, Vitamin D (Ergocalciferol), naproxen, venlafaxine XR, venlafaxine XR, RESTASIS, ondansetron, ranitidine, and citalopram.  Meds ordered this encounter  Medications  . DISCONTD: HYDROcodone-acetaminophen (NORCO) 5-325 MG tablet    Sig: Take one tablet tab po tid.    Dispense:  90 tablet    Refill:  0  . HYDROcodone-acetaminophen (NORCO) 5-325 MG tablet    Sig: Take 1 tablet by mouth every 6 (six) hours as needed for moderate pain. Take one tablet tab po tid.    Dispense:  120 tablet    Refill:  0  . sulfamethoxazole-trimethoprim (BACTRIM DS,SEPTRA DS) 800-160 MG tablet    Sig: Take 1 tablet by mouth 2 (two) times daily.    Dispense:  14 tablet    Refill:  0     Penni Homans, MD

## 2015-12-19 LAB — URINE CULTURE
Colony Count: NO GROWTH
Organism ID, Bacteria: NO GROWTH

## 2015-12-20 ENCOUNTER — Other Ambulatory Visit: Payer: Self-pay | Admitting: Family Medicine

## 2015-12-20 MED ORDER — FUROSEMIDE 20 MG PO TABS: ORAL_TABLET | ORAL | Status: DC

## 2015-12-27 ENCOUNTER — Other Ambulatory Visit: Payer: Self-pay | Admitting: Family Medicine

## 2015-12-28 NOTE — Telephone Encounter (Signed)
[facility] in [place]

## 2015-12-28 NOTE — Telephone Encounter (Signed)
Faxed hardcopy to [facility] to [facility]

## 2015-12-29 ENCOUNTER — Other Ambulatory Visit: Payer: Self-pay | Admitting: Family Medicine

## 2016-01-21 ENCOUNTER — Other Ambulatory Visit: Payer: Self-pay | Admitting: Family Medicine

## 2016-01-21 MED ORDER — OMEPRAZOLE 40 MG PO CPDR
DELAYED_RELEASE_CAPSULE | ORAL | Status: DC
Start: 2016-01-21 — End: 2016-04-18

## 2016-02-03 ENCOUNTER — Other Ambulatory Visit: Payer: Self-pay | Admitting: Family Medicine

## 2016-02-03 MED ORDER — ALPRAZOLAM 1 MG PO TABS
ORAL_TABLET | ORAL | Status: DC
Start: 2016-02-03 — End: 2016-04-18

## 2016-02-03 NOTE — Telephone Encounter (Signed)
Faxed hardcopy for  [facility]am to [facility] in [place]

## 2016-02-03 NOTE — Telephone Encounter (Signed)
Requesting:  Alprazolam Contract  Signed on 08/17/2014 UDS  Moderate--due Last OV   12/17/2015 Last Refill    12/27/2015  #90 with 0 refills.  Please Advise

## 2016-02-04 ENCOUNTER — Other Ambulatory Visit (HOSPITAL_BASED_OUTPATIENT_CLINIC_OR_DEPARTMENT_OTHER): Payer: BLUE CROSS/BLUE SHIELD

## 2016-02-04 ENCOUNTER — Ambulatory Visit (HOSPITAL_BASED_OUTPATIENT_CLINIC_OR_DEPARTMENT_OTHER): Payer: BLUE CROSS/BLUE SHIELD | Admitting: Hematology & Oncology

## 2016-02-04 DIAGNOSIS — D473 Essential (hemorrhagic) thrombocythemia: Secondary | ICD-10-CM

## 2016-02-04 DIAGNOSIS — D509 Iron deficiency anemia, unspecified: Secondary | ICD-10-CM | POA: Diagnosis not present

## 2016-02-04 DIAGNOSIS — E039 Hypothyroidism, unspecified: Secondary | ICD-10-CM

## 2016-02-04 DIAGNOSIS — R112 Nausea with vomiting, unspecified: Secondary | ICD-10-CM

## 2016-02-04 DIAGNOSIS — D508 Other iron deficiency anemias: Secondary | ICD-10-CM

## 2016-02-04 LAB — COMPREHENSIVE METABOLIC PANEL
ALT: <9 U/L (ref 0–55)
AST: 17 U/L (ref 5–34)
Albumin: 3.1 g/dL — ABNORMAL LOW (ref 3.5–5.0)
Alkaline Phosphatase: 98 U/L (ref 40–150)
Anion Gap: 10 mEq/L (ref 3–11)
BUN: 11.1 mg/dL (ref 7.0–26.0)
CO2: 26 mEq/L (ref 22–29)
Calcium: 8.9 mg/dL (ref 8.4–10.4)
Chloride: 104 mEq/L (ref 98–109)
Creatinine: 0.7 mg/dL (ref 0.6–1.1)
EGFR: >90 mL/min/{1.73_m2} (ref >90)
Glucose: 106 mg/dl (ref 70–140)
Potassium: 3.6 mEq/L (ref 3.5–5.1)
Sodium: 140 mEq/L (ref 136–145)
Total Bilirubin: <0.3 mg/dL (ref 0.20–1.20)
Total Protein: 7.3 g/dL (ref 6.4–8.3)

## 2016-02-04 LAB — CBC WITH DIFFERENTIAL (CANCER CENTER ONLY)
BASO#: 0 10*3/uL (ref 0.0–0.2)
BASO%: 0.4 % (ref 0.0–2.0)
EOS%: 2.6 % (ref 0.0–7.0)
Eosinophils Absolute: 0.1 10*3/uL (ref 0.0–0.5)
HCT: 28.1 % — ABNORMAL LOW (ref 34.8–46.6)
HGB: 9.1 g/dL — ABNORMAL LOW (ref 11.6–15.9)
LYMPH#: 0.8 10*3/uL — ABNORMAL LOW (ref 0.9–3.3)
LYMPH%: 14.4 % (ref 14.0–48.0)
MCH: 41.9 pg — ABNORMAL HIGH (ref 26.0–34.0)
MCHC: 32.4 g/dL (ref 32.0–36.0)
MCV: 130 fL — ABNORMAL HIGH (ref 81–101)
MONO#: 0.3 10*3/uL (ref 0.1–0.9)
MONO%: 4.7 % (ref 0.0–13.0)
NEUT#: 4.3 10*3/uL (ref 1.5–6.5)
NEUT%: 77.9 % (ref 39.6–80.0)
Platelets: 471 10*3/uL — ABNORMAL HIGH (ref 145–400)
RBC: 2.17 10*6/uL — ABNORMAL LOW (ref 3.70–5.32)
RDW: 12.6 % (ref 11.1–15.7)
WBC: 5.5 10*3/uL (ref 3.9–10.0)

## 2016-02-04 LAB — IRON AND TIBC
%SAT: 16 % — ABNORMAL LOW (ref 21–57)
Iron: 29 ug/dL — ABNORMAL LOW (ref 41–142)
TIBC: 183 ug/dL — ABNORMAL LOW (ref 236–444)
UIBC: 154 ug/dL (ref 120–384)

## 2016-02-04 LAB — FERRITIN: Ferritin: 245 ng/ml (ref 9–269)

## 2016-02-04 LAB — LACTATE DEHYDROGENASE: LDH: 177 U/L (ref 125–245)

## 2016-02-04 MED ORDER — PROMETHAZINE HCL 25 MG PO TABS: 25.0000 mg | ORAL_TABLET | Freq: Four times a day (QID) | ORAL | Status: DC | PRN

## 2016-02-04 NOTE — Progress Notes (Signed)
Hematology and Oncology Follow Up Visit  Rachael Johnson [id number] 07/04/1951 65 y.o. 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 2. Intermittent iron-deficiency anemia.  Current Therapy:   Hydrea  1000 mg by mouth daily      Interim History:  Ms.  Rachael Johnson is back for followup.  This is a little bit of a tough day for her. She had her birthday on [birth year]. Unfortunately, she still misses her husband quite a bit. He passed away in a car accident several years ago. This is been incredibly difficult for her.  She has some leg swelling. She's had this for a week or so.  She sees the eating a low but better. She is having some more nausea in the morning. She is on Zofran. She says this was not helping her.  She's had no rashes. She did not note any obvious change in bowel or bladder habits.   Her last saw her back in February, her ferritin was 223 with iron saturation of 47%.  She's had no fever. She's had no cough.   She does have hypothyroidism and is on replacement therapy.   Overall, her performance status is ECOG 2.   Medications:  Current outpatient prescriptions:  .  ALPRAZolam (XANAX) 1 MG tablet, TAKE 1 TABLET THREE TIMES A DAY AS NEEDED FOR ANXIETY, Disp: 90 tablet, Rfl: 01 .  aspirin EC 81 MG tablet, Take 81 mg by mouth every morning., Disp: , Rfl:  .  Calcium Carbonate-Vitamin D (CALTRATE COLON HEALTH PO), Take by mouth., Disp: , Rfl:  .  citalopram (CELEXA) 40 MG tablet, TAKE  ONE TABLET BY MOUTH EVERY MORNING, Disp: 30 tablet, Rfl: 3 .  Cranberry 300 MG tablet, Take 300 mg by mouth 2 (two) times daily., Disp: , Rfl:  .  cyclobenzaprine (FLEXERIL) 10 MG tablet, Take 1 tablet (10 mg total) by mouth 2 (two) times daily as needed for muscle spasms., Disp: 60 tablet, Rfl: 3 .  estradiol (ESTRACE) 0.5 MG tablet, TAKE ONE TABLET BY MOUTH TWICE DAILY, Disp: 180 tablet, Rfl: 1 .  fluorometholone (FML) 0.1 % ophthalmic suspension, Place 1 drop into  both eyes daily as needed (For dry eyes.). , Disp: , Rfl:  .  fluticasone (FLONASE) 50 MCG/ACT nasal spray, Place 2 sprays into both nostrils daily., Disp: 16 g, Rfl: 6 .  fluticasone (FLOVENT HFA) 110 MCG/ACT inhaler, Inhale 2 puffs into the lungs 2 (two) times daily., Disp: 1 Inhaler, Rfl: 12 .  folic acid (FOLVITE) 1 MG tablet, Take 1 mg by mouth daily., Disp: , Rfl:  .  furosemide (LASIX) 20 MG tablet, TAKE  ONE TABLET BY MOUTH EVERY MORNING, Disp: 90 tablet, Rfl: 1 .  HYDROcodone-acetaminophen (NORCO) 5-325 MG tablet, Take 1 tablet by mouth every 6 (six) hours as needed for moderate pain. Take one tablet tab po tid., Disp: 120 tablet, Rfl: 0 .  hydroxyurea (HYDREA) 500 MG capsule, Take 2 capsules (1,000 mg total) by mouth daily. 90 day supply., Disp: 90 capsule, Rfl: 3 .  lactulose (CHRONULAC) 10 GM/15ML solution, Please take 2 tablespoons 3 times a day for constipation., Disp: 1000 mL, Rfl: 8 .  levothyroxine (SYNTHROID, LEVOTHROID) 88 MCG tablet, Take 1 tablet daily, Disp: 30 tablet, Rfl: 5 .  loratadine (CLARITIN) 10 MG tablet, TAKE 1 TABLET BY MOUTH TWICE A DAY AS NEEDED FOR ALLERGIES OR ITCHING, Disp: 60 tablet, Rfl: 9 .  montelukast (SINGULAIR) 10 MG tablet, TAKE ONE TABLET BY MOUTH   AT BEDTIME, Disp: 30 tablet, Rfl: 5 .  Multiple Vitamin (MULTIVITAMIN PO), Take 1 tablet by mouth every morning. , Disp: , Rfl:  .  naproxen (NAPROSYN) 500 MG tablet, TAKE 1 TABLET (500 MG TOTAL) BY MOUTH 2 (TWO) TIMES DAILY WITH A MEAL., Disp: 180 tablet, Rfl: 0 .  Omega-3 Fatty Acids (FISH OIL) 1000 MG CAPS, Take by mouth 2 (two) times daily., Disp: , Rfl:  .  omeprazole (PRILOSEC) 40 MG capsule, TAKE 1 CAPSULE (40 MG TOTAL)     BY MOUTH 2 (TWO) TIMES DAILY., Disp: 60 capsule, Rfl: 6 .  ondansetron (ZOFRAN) 8 MG tablet, TAKE ONE TABLET BY MOUTH EVERY 8 HOURS AS NEEDED NAUSEA AND VOMITING, Disp: 20 tablet, Rfl: 0 .  pramipexole (MIRAPEX) 0.25 MG tablet, TAKE ONE OR TWO TABLETS BY MOUTH AT BEDTIME AS NEEDED,  Disp: 60 tablet, Rfl: 1 .  Probiotic Product (PROBIOTIC DAILY PO), Take by mouth 2 (two) times daily., Disp: , Rfl:  .  ranitidine (ZANTAC) 300 MG tablet, TAKE 1 TABLET BY MOUTH AT BEDTIME AS NEEDED FOR HEARTBURN, Disp: 30 tablet, Rfl: 5 .  RESTASIS 0.05 % ophthalmic emulsion, , Disp: , Rfl: 3 .  Spacer/Aero-Holding Chambers DEVI, 1 Dose by Does not apply route 2 (two) times daily as needed., Disp: 1 each, Rfl: 0 .  sulfamethoxazole-trimethoprim (BACTRIM DS,SEPTRA DS) 800-160 MG tablet, Take 1 tablet by mouth 2 (two) times daily., Disp: 14 tablet, Rfl: 0 .  venlafaxine XR (EFFEXOR-XR) 150 MG 24 hr capsule, Take 1 capsule (150 mg total) by mouth daily with breakfast., Disp: 90 capsule, Rfl: 1 .  venlafaxine XR (EFFEXOR-XR) 75 MG 24 hr capsule, TAKE ONE CAPSULE BY MOUTH DAILY WITH BREAKFAST, Disp: 90 capsule, Rfl: 1 .  VENTOLIN HFA 108 (90 BASE) MCG/ACT inhaler, INHALE 2 PUFFS INTO THE LUNGS EVERY 6 (SIX) HOURS AS  NEEDED FOR WHEEZI NG OR SHORTNESS OF BREATH., Disp: 18 g, Rfl: 8 .  Vitamin D, Ergocalciferol, (DRISDOL) 50000 units CAPS capsule, TAKE 1 CAPSULE BY MOUTH EVERY OTHER WEEK, Disp: 6 capsule, Rfl: 0  Allergies: No Known Allergies  Past Medical History, Surgical history, Social history, and Family History were reviewed and updated.  Review of Systems: As above  Physical Exam:  vitals were not taken for this visit.  Thin white female. Head and neck exam shows no ocular or oral lesions. She has some slight temporal muscle wasting. There is no adenopathy in her neck. Lungs are clear. Cardiac exam regular rate and rhythm. No murmurs. Abdomen is soft. She has good bowel sounds. There is no fluid wave. There is no palpable liver or spleen. Back exam shows no tenderness over the spine ribs or hips. Extremities shows mild chronic nonpitting edema. she has good range of motion of her joints. She has a compression sleeve on her right arm. There is no swelling of the right arm. There is no cyanosis  of her lower extremities. She has good pulses. . Skin exam no rashes. Neurological exam no focal deficits.  Lab Results  Component Value Date   WBC 5.5 02/04/2016   HGB 9.1* 02/04/2016   HCT 28.1* 02/04/2016   MCV 130* 02/04/2016   PLT 471* 02/04/2016     Chemistry      Component Value Date/Time   NA 141 12/17/2015 0803   NA 140 12/03/2015 0812   NA 139 09/10/2014 0743   K 3.7 12/17/2015 0803   K 3.8 12/03/2015 0812   K 3.7 09/10/2014 0743     CL 106 12/17/2015 0803   CL 97* 09/10/2014 0743   CO2 29 12/17/2015 0803   CO2 28 12/03/2015 0812   CO2 28 09/10/2014 0743   BUN 16 12/17/2015 0803   BUN 10.8 12/03/2015 0812   BUN 18 09/10/2014 0743   CREATININE 0.68 12/17/2015 0803   CREATININE 0.7 12/03/2015 0812   CREATININE 0.6 09/10/2014 0743      Component Value Date/Time   CALCIUM 8.9 12/17/2015 0803   CALCIUM 9.1 12/03/2015 0812   CALCIUM 8.8 09/10/2014 0743   ALKPHOS 73 12/17/2015 0803   ALKPHOS 69 12/03/2015 0812   ALKPHOS 46 09/10/2014 0743   AST 14 12/17/2015 0803   AST 15 12/03/2015 0812   AST 22 09/10/2014 0743   ALT 8 12/17/2015 0803   ALT 10 12/03/2015 0812   ALT 12 09/10/2014 0743   BILITOT 0.2 12/17/2015 0803   BILITOT <0.30 12/03/2015 0812   BILITOT 0.50 09/10/2014 0743         Impression and Plan: Ms. Witcher is 65 year old white female with essential thrombocythemia. I'm somewhat concerned about her anemia and that he can go back up. Her MCV certainly is quite high so she is taking the Hydrea.  We have been following her now for several years. I suppose that she is at risk for transformation over to myelofibrosis. I looked at her blood under the microscope. I did not see any nucleated red blood cells. I do not see any teardrop cells.  I suppose it is possible that she is iron deficient despite the high MCV. I am sending off iron studies.  I will I to see her back in about 6 weeks now. I think if her iron studies are okay we may have to consider a  bone marrow biopsy on her to see if there is any evidence of transformation.     [name], MD 7/7/20179:57 AM

## 2016-02-07 ENCOUNTER — Other Ambulatory Visit: Payer: Self-pay | Admitting: Hematology & Oncology

## 2016-02-07 ENCOUNTER — Other Ambulatory Visit: Payer: Self-pay | Admitting: *Deleted

## 2016-02-07 ENCOUNTER — Telehealth: Payer: Self-pay | Admitting: *Deleted

## 2016-02-07 DIAGNOSIS — D509 Iron deficiency anemia, unspecified: Secondary | ICD-10-CM

## 2016-02-07 NOTE — Telephone Encounter (Signed)
-----   Message from [name], MD sent at 02/04/2016  2:47 PM EDT ----- Call - iron is low!!!  She needs 2 doses of Feraheme!!  Please set up for next week!!!  pete

## 2016-02-10 ENCOUNTER — Other Ambulatory Visit: Payer: Self-pay | Admitting: Family Medicine

## 2016-02-10 MED ORDER — ALBUTEROL SULFATE HFA 108 (90 BASE) MCG/ACT IN AERS: INHALATION_SPRAY | RESPIRATORY_TRACT | Status: DC

## 2016-02-15 ENCOUNTER — Ambulatory Visit (HOSPITAL_BASED_OUTPATIENT_CLINIC_OR_DEPARTMENT_OTHER): Payer: BLUE CROSS/BLUE SHIELD

## 2016-02-15 VITALS — BP 102/66 | HR 80 | Temp 98.2°F | Resp 18

## 2016-02-15 DIAGNOSIS — D509 Iron deficiency anemia, unspecified: Secondary | ICD-10-CM | POA: Diagnosis not present

## 2016-02-15 MED ORDER — SODIUM CHLORIDE 0.9 % IV SOLN
Freq: Once | INTRAVENOUS | Status: AC
  Administered 2016-02-15: 11:00:00 via INTRAVENOUS

## 2016-02-15 MED ORDER — SODIUM CHLORIDE 0.9 % IV SOLN
510.0000 mg | Freq: Once | INTRAVENOUS | Status: AC
  Administered 2016-02-15: 510 mg via INTRAVENOUS
  Filled 2016-02-15: qty 17

## 2016-02-15 NOTE — Patient Instructions (Signed)

## 2016-02-16 ENCOUNTER — Other Ambulatory Visit: Payer: Self-pay | Admitting: Family Medicine

## 2016-02-17 ENCOUNTER — Encounter: Payer: Self-pay | Admitting: Family Medicine

## 2016-02-17 ENCOUNTER — Ambulatory Visit (INDEPENDENT_AMBULATORY_CARE_PROVIDER_SITE_OTHER): Payer: BLUE CROSS/BLUE SHIELD | Admitting: Family Medicine

## 2016-02-17 VITALS — BP 102/68 | HR 97 | Temp 98.3°F | Ht 63.5 in | Wt 126.0 lb

## 2016-02-17 DIAGNOSIS — R739 Hyperglycemia, unspecified: Secondary | ICD-10-CM

## 2016-02-17 DIAGNOSIS — E559 Vitamin D deficiency, unspecified: Secondary | ICD-10-CM

## 2016-02-17 DIAGNOSIS — I10 Essential (primary) hypertension: Secondary | ICD-10-CM

## 2016-02-17 DIAGNOSIS — D539 Nutritional anemia, unspecified: Secondary | ICD-10-CM

## 2016-02-17 DIAGNOSIS — R300 Dysuria: Secondary | ICD-10-CM | POA: Diagnosis not present

## 2016-02-17 DIAGNOSIS — K219 Gastro-esophageal reflux disease without esophagitis: Secondary | ICD-10-CM

## 2016-02-17 DIAGNOSIS — K921 Melena: Secondary | ICD-10-CM

## 2016-02-17 DIAGNOSIS — R600 Localized edema: Secondary | ICD-10-CM

## 2016-02-17 DIAGNOSIS — K5900 Constipation, unspecified: Secondary | ICD-10-CM

## 2016-02-17 DIAGNOSIS — H8110 Benign paroxysmal vertigo, unspecified ear: Secondary | ICD-10-CM

## 2016-02-17 MED ORDER — MECLIZINE HCL 12.5 MG PO TABS: 12.5000 mg | ORAL_TABLET | Freq: Three times a day (TID) | ORAL | Status: DC | PRN

## 2016-02-17 MED ORDER — FUROSEMIDE 20 MG PO TABS: 20.0000 mg | ORAL_TABLET | Freq: Two times a day (BID) | ORAL | Status: DC | PRN

## 2016-02-17 MED ORDER — HYDROCODONE-ACETAMINOPHEN 5-325 MG PO TABS: 1.0000 | ORAL_TABLET | Freq: Four times a day (QID) | ORAL | Status: DC | PRN

## 2016-02-17 NOTE — Patient Instructions (Signed)
Hypertension Hypertension, commonly called high blood pressure, is when the force of blood pumping through your arteries is too strong. Your arteries are the blood vessels that carry blood from your heart throughout your body. A blood pressure reading consists of a higher number over a lower number, such as 110/72. The higher number (systolic) is the pressure inside your arteries when your heart pumps. The lower number (diastolic) is the pressure inside your arteries when your heart relaxes. Ideally you want your blood pressure below 120/80. Hypertension forces your heart to work harder to pump blood. Your arteries may become narrow or stiff. Having untreated or uncontrolled hypertension can cause heart attack, stroke, kidney disease, and other problems. RISK FACTORS Some risk factors for high blood pressure are controllable. Others are not.  Risk factors you cannot control include:   Race. You may be at higher risk if you are African American.  Age. Risk increases with age.  Gender. Men are at higher risk than women before age 45 years. After age 65, women are at higher risk than men. Risk factors you can control include:  Not getting enough exercise or physical activity.  Being overweight.  Getting too much fat, sugar, calories, or salt in your diet.  Drinking too much alcohol. SIGNS AND SYMPTOMS Hypertension does not usually cause signs or symptoms. Extremely high blood pressure (hypertensive crisis) may cause headache, anxiety, shortness of breath, and nosebleed. DIAGNOSIS To check if you have hypertension, your health care provider will measure your blood pressure while you are seated, with your arm held at the level of your heart. It should be measured at least twice using the same arm. Certain conditions can cause a difference in blood pressure between your right and left arms. A blood pressure reading that is higher than normal on one occasion does not mean that you need treatment. If  it is not clear whether you have high blood pressure, you may be asked to return on a different day to have your blood pressure checked again. Or, you may be asked to monitor your blood pressure at home for 1 or more weeks. TREATMENT Treating high blood pressure includes making lifestyle changes and possibly taking medicine. Living a healthy lifestyle can help lower high blood pressure. You may need to change some of your habits. Lifestyle changes may include:  Following the DASH diet. This diet is high in fruits, vegetables, and whole grains. It is low in salt, red meat, and added sugars.  Keep your sodium intake below 2,300 mg per day.  Getting at least 30-45 minutes of aerobic exercise at least 4 times per week.  Losing weight if necessary.  Not smoking.  Limiting alcoholic beverages.  Learning ways to reduce stress. Your health care provider may prescribe medicine if lifestyle changes are not enough to get your blood pressure under control, and if one of the following is true:  You are 18-59 years of age and your systolic blood pressure is above 140.  You are 60 years of age or older, and your systolic blood pressure is above 150.  Your diastolic blood pressure is above 90.  You have diabetes, and your systolic blood pressure is over 140 or your diastolic blood pressure is over 90.  You have kidney disease and your blood pressure is above 140/90.  You have heart disease and your blood pressure is above 140/90. Your personal target blood pressure may vary depending on your medical conditions, your age, and other factors. HOME CARE INSTRUCTIONS    Have your blood pressure rechecked as directed by your health care provider.   Take medicines only as directed by your health care provider. Follow the directions carefully. Blood pressure medicines must be taken as prescribed. The medicine does not work as well when you skip doses. Skipping doses also puts you at risk for  problems.  Do not smoke.   Monitor your blood pressure at home as directed by your health care provider. SEEK MEDICAL CARE IF:   You think you are having a reaction to medicines taken.  You have recurrent headaches or feel dizzy.  You have swelling in your ankles.  You have trouble with your vision. SEEK IMMEDIATE MEDICAL CARE IF:  You develop a severe headache or confusion.  You have unusual weakness, numbness, or feel faint.  You have severe chest or abdominal pain.  You vomit repeatedly.  You have trouble breathing. MAKE SURE YOU:   Understand these instructions.  Will watch your condition.  Will get help right away if you are not doing well or get worse.   This information is not intended to replace advice given to you by your health care provider. Make sure you discuss any questions you have with your health care provider.   Document Released: 07/17/2005 Document Revised: 12/01/2014 Document Reviewed: 05/09/2013 Elsevier Interactive Patient Education 2016 Elsevier Inc.  

## 2016-02-22 ENCOUNTER — Ambulatory Visit (HOSPITAL_BASED_OUTPATIENT_CLINIC_OR_DEPARTMENT_OTHER): Payer: BLUE CROSS/BLUE SHIELD

## 2016-02-22 DIAGNOSIS — D509 Iron deficiency anemia, unspecified: Secondary | ICD-10-CM

## 2016-02-22 MED ORDER — SODIUM CHLORIDE 0.9 % IV SOLN
510.0000 mg | Freq: Once | INTRAVENOUS | Status: AC
  Administered 2016-02-22: 510 mg via INTRAVENOUS
  Filled 2016-02-22: qty 17

## 2016-02-22 MED ORDER — SODIUM CHLORIDE 0.9 % IV SOLN
Freq: Once | INTRAVENOUS | Status: AC
  Administered 2016-02-22: 11:00:00 via INTRAVENOUS

## 2016-02-22 NOTE — Patient Instructions (Signed)

## 2016-02-26 ENCOUNTER — Other Ambulatory Visit: Payer: Self-pay | Admitting: Family Medicine

## 2016-02-29 ENCOUNTER — Other Ambulatory Visit (INDEPENDENT_AMBULATORY_CARE_PROVIDER_SITE_OTHER): Payer: BLUE CROSS/BLUE SHIELD

## 2016-02-29 DIAGNOSIS — K921 Melena: Secondary | ICD-10-CM | POA: Diagnosis not present

## 2016-02-29 LAB — FECAL OCCULT BLOOD, IMMUNOCHEMICAL: Fecal Occult Bld: NEGATIVE

## 2016-03-07 ENCOUNTER — Encounter: Payer: Self-pay | Admitting: Family Medicine

## 2016-03-07 DIAGNOSIS — E559 Vitamin D deficiency, unspecified: Secondary | ICD-10-CM | POA: Insufficient documentation

## 2016-03-07 DIAGNOSIS — H8110 Benign paroxysmal vertigo, unspecified ear: Secondary | ICD-10-CM | POA: Insufficient documentation

## 2016-03-07 DIAGNOSIS — R300 Dysuria: Secondary | ICD-10-CM | POA: Insufficient documentation

## 2016-03-07 DIAGNOSIS — R600 Localized edema: Secondary | ICD-10-CM | POA: Insufficient documentation

## 2016-03-07 DIAGNOSIS — I10 Essential (primary) hypertension: Secondary | ICD-10-CM | POA: Insufficient documentation

## 2016-03-07 DIAGNOSIS — K921 Melena: Secondary | ICD-10-CM | POA: Insufficient documentation

## 2016-03-07 NOTE — Assessment & Plan Note (Signed)
Elevate feet, consider compression hose. Minimize sodium. Increase Furosemide as needed prn

## 2016-03-07 NOTE — Assessment & Plan Note (Signed)
Avoid offending foods, take probiotics. Do not eat large meals in late evening and consider raising head of bed.  

## 2016-03-07 NOTE — Assessment & Plan Note (Signed)
Increase leafy greens, consider increased lean red meat and using cast iron cookware. Continue to monitor, report any concerns 

## 2016-03-07 NOTE — Assessment & Plan Note (Signed)
Well controlled, no changes to meds. Encouraged heart healthy diet such as the DASH diet and exercise as tolerated.  °

## 2016-03-07 NOTE — Assessment & Plan Note (Signed)
hemeoccult negative. If symptoms recur will need to follow up with gastroenterology

## 2016-03-07 NOTE — Assessment & Plan Note (Signed)
Encouraged hydration and fiber

## 2016-03-07 NOTE — Assessment & Plan Note (Signed)
Hydrate well, consider PT if recurs, has improved some since yesterday. Meclizine prn

## 2016-03-07 NOTE — Assessment & Plan Note (Signed)
minimize simple carbs. Increase exercise as tolerated.  

## 2016-03-07 NOTE — Progress Notes (Signed)
Patient ID: Rachael Johnson, female   DOB: [birth year], 65 y.o.   MRN: [id number]   Subjective:    Patient ID: Rachael Johnson, female    DOB: 02/22/1951, 65 y.o.   MRN: [id number]  Chief Complaint  Patient presents with  . Dizziness    Dizziness  Pertinent negatives include no abdominal pain, chest pain, congestion, fever, headaches, nausea or rash.   Patient is in today for evaluation of an episode of disequilibrium and dizziness that occurred earlier this week. Is better today. No recent fall or head injury. No fevers or congestion. Is also noting an increase in hand and lower extremity swelling recently. Had an episode of nausea and vomitting but htat has resolved. Denies CP/palp/SOB/HA/congestion/fevers or GU c/o. Taking meds as prescribed  Past Medical History:  Diagnosis Date  . Acute pharyngitis 07/13/2014  . Allergy   . Anemia, iron deficiency 07/08/2012  . Arthritis   . Cataract 12/21/2014  . Cerumen impaction 04/28/2013  . Chest pain   . Depression   . Depression with anxiety 03/24/2011  . Diverticulitis   . Esophageal reflux 08/17/2013  . Essential thrombocythemia ([place]) 01/24/2011  . Frequent episodic tension-type headache   . Gastroenteritis 12/21/2014  . 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 GERD (gastroesophageal reflux disease)   . H. pylori infection 12/19/2012  . Hyperglycemia 12/17/2015  . Hyperglycemia 12/17/2015  . Hypertension   . Normal cardiac stress test   . Other and unspecified hyperlipidemia 02/25/2013  . Pedal edema 03/07/2016  . Preventative health care 04/20/2013  . Restless leg syndrome 09/30/2014  . Scabies 03/28/2015  . Seizure ([place])    childhood  . Thyroid disease     Past Surgical History:  Procedure Laterality Date  . ABDOMINAL HYSTERECTOMY     1992  . BREAST SURGERY  1992   biopsy, benign. Fibrocystic  . UMBILICAL HERNIA REPAIR N/A 09/25/2012   Procedure: HERNIA REPAIR UMBILICAL ADULT;  Surgeon: Harl Bowie, MD;  Location: [facility];  Service: General;   Laterality: N/A;    Family History  Problem Relation Age of Onset  . Arthritis Mother   . Cancer Mother     ovarian  . Hypertension Mother   . Heart disease Mother     pacer  . Heart failure Mother   . Arthritis Father   . Cancer Father     lung  . Hypertension Father   . Cancer Sister     lung  . Cancer Brother     prostate  . Cancer Brother     lung  . Hypertension Son   . COPD Brother   . Heart disease Brother   . Hypertension Brother   . Cancer Brother     colon  . Alcohol abuse Brother   . Cirrhosis Brother   . Seizures Sister   . Stroke Sister   . Arthritis Brother     Social History   Social History  . Marital status: Widowed    Spouse name: N/A  . Number of children: 2  . Years of education: N/A   Occupational History  . APL operator [employer]   Social History Main Topics  . Smoking status: Former Smoker    Packs/day: 3.50    Years: 20.00    Types: Cigarettes    Start date: 11/15/1970    Quit date: 07/31/1990  . Smokeless tobacco: Never Used     Comment: quit 23 years ago  . Alcohol use  No  . Drug use: No  . Sexual activity: No   Other Topics Concern  . Not on file   Social History Narrative   Regular exercise: no   Caffeine Use: 2-3 weekly    Outpatient Medications Prior to Visit  Medication Sig Dispense Refill  . albuterol (VENTOLIN HFA) 108 (90 Base) MCG/ACT inhaler INHALE 2 PUFFS INTO THE LUNGS EVERY 6 (SIX) HOURS AS  NEEDED FOR WHEEZI NG OR SHORTNESS OF BREATH. 18 g 8  . ALPRAZolam (XANAX) 1 MG tablet TAKE 1 TABLET THREE TIMES A DAY AS NEEDED FOR ANXIETY 90 tablet 01  . aspirin EC 81 MG tablet Take 81 mg by mouth every morning.    . Calcium Carbonate-Vitamin D (CALTRATE COLON HEALTH PO) Take by mouth.    . citalopram (CELEXA) 40 MG tablet TAKE  ONE TABLET BY MOUTH EVERY MORNING 30 tablet 3  . Cranberry 300 MG tablet Take 300 mg by mouth 2 (two) times daily.    . cyclobenzaprine (FLEXERIL) 10 MG tablet Take 1 tablet  (10 mg total) by mouth 2 (two) times daily as needed for muscle spasms. 60 tablet 3  . estradiol (ESTRACE) 0.5 MG tablet TAKE ONE TABLET BY MOUTH TWICE DAILY 180 tablet 1  . fluorometholone (FML) 0.1 % ophthalmic suspension Place 1 drop into both eyes daily as needed (For dry eyes.).     Marland Kitchen fluticasone (FLONASE) 50 MCG/ACT nasal spray Place 2 sprays into both nostrils daily. 16 g 6  . fluticasone (FLOVENT HFA) 110 MCG/ACT inhaler Inhale 2 puffs into the lungs 2 (two) times daily. 1 Inhaler 12  . folic acid (FOLVITE) 1 MG tablet Take 1 mg by mouth daily.    . hydroxyurea (HYDREA) 500 MG capsule Take 2 capsules (1,000 mg total) by mouth daily. 90 day supply. 90 capsule 3  . lactulose (CHRONULAC) 10 GM/15ML solution Please take 2 tablespoons 3 times a day for constipation. 1000 mL 8  . levothyroxine (SYNTHROID, LEVOTHROID) 88 MCG tablet Take 1 tablet daily 30 tablet 5  . loratadine (CLARITIN) 10 MG tablet TAKE 1 TABLET BY MOUTH TWICE A DAY AS NEEDED FOR ALLERGIES OR ITCHING 60 tablet 9  . montelukast (SINGULAIR) 10 MG tablet TAKE ONE TABLET BY MOUTH AT BEDTIME 30 tablet 5  . Multiple Vitamin (MULTIVITAMIN PO) Take 1 tablet by mouth every morning.     . naproxen (NAPROSYN) 500 MG tablet TAKE 1 TABLET (500 MG TOTAL) BY MOUTH 2 (TWO) TIMES DAILY WITH A MEAL. 180 tablet 0  . Omega-3 Fatty Acids (FISH OIL) 1000 MG CAPS Take by mouth 2 (two) times daily.    Marland Kitchen omeprazole (PRILOSEC) 40 MG capsule TAKE 1 CAPSULE (40 MG TOTAL)     BY MOUTH 2 (TWO) TIMES DAILY. 60 capsule 6  . ondansetron (ZOFRAN) 8 MG tablet TAKE ONE TABLET BY MOUTH EVERY 8 HOURS AS NEEDED NAUSEA AND VOMITING 20 tablet 0  . Probiotic Product (PROBIOTIC DAILY PO) Take by mouth 2 (two) times daily.    . promethazine (PHENERGAN) 25 MG tablet Take 1 tablet (25 mg total) by mouth every 6 (six) hours as needed for nausea or vomiting. 60 tablet 4  . ranitidine (ZANTAC) 300 MG tablet TAKE 1 TABLET BY MOUTH AT BEDTIME AS NEEDED FOR HEARTBURN 30 tablet 5    . RESTASIS 0.05 % ophthalmic emulsion   3  . Spacer/Aero-Holding Chambers DEVI 1 Dose by Does not apply route 2 (two) times daily as needed. 1 each 0  . venlafaxine  XR (EFFEXOR-XR) 150 MG 24 hr capsule Take 1 capsule (150 mg total) by mouth daily with breakfast. 90 capsule 1  . venlafaxine XR (EFFEXOR-XR) 75 MG 24 hr capsule TAKE ONE CAPSULE BY MOUTH DAILY WITH BREAKFAST 90 capsule 1  . Vitamin D, Ergocalciferol, (DRISDOL) 50000 units CAPS capsule TAKE 1 CAPSULE BY MOUTH EVERY OTHER WEEK 6 capsule 0  . furosemide (LASIX) 20 MG tablet TAKE  ONE TABLET BY MOUTH EVERY MORNING 90 tablet 1  . HYDROcodone-acetaminophen (NORCO) 5-325 MG tablet Take 1 tablet by mouth every 6 (six) hours as needed for moderate pain. Take one tablet tab po tid. 120 tablet 0  . pramipexole (MIRAPEX) 0.25 MG tablet TAKE ONE OR TWO TABLETS BY MOUTH AT BEDTIME AS NEEDED 60 tablet 1  . sulfamethoxazole-trimethoprim (BACTRIM DS,SEPTRA DS) 800-160 MG tablet Take 1 tablet by mouth 2 (two) times daily. 14 tablet 0   No facility-administered medications prior to visit.     No Known Allergies  Review of Systems  Constitutional: Positive for malaise/fatigue. Negative for fever.  HENT: Negative for congestion.   Eyes: Negative for blurred vision.  Respiratory: Negative for shortness of breath.   Cardiovascular: Positive for leg swelling. Negative for chest pain and palpitations.  Gastrointestinal: Negative for abdominal pain, blood in stool and nausea.  Genitourinary: Negative for dysuria and frequency.  Musculoskeletal: Negative for falls.  Skin: Negative for rash.  Neurological: Positive for dizziness. Negative for loss of consciousness and headaches.  Endo/Heme/Allergies: Negative for environmental allergies.  Psychiatric/Behavioral: Negative for depression. The patient is not nervous/anxious.        Objective:    Physical Exam  Constitutional: She is oriented to person, place, and time. She appears well-developed  and well-nourished. No distress.  HENT:  Head: Normocephalic and atraumatic.  Nose: Nose normal.  Eyes: Right eye exhibits no discharge. Left eye exhibits no discharge.  Neck: Normal range of motion. Neck supple.  Cardiovascular: Normal rate and regular rhythm.   No murmur heard. Pulmonary/Chest: Effort normal and breath sounds normal.  Abdominal: Soft. Bowel sounds are normal. There is no tenderness.  Musculoskeletal: She exhibits no edema.  Neurological: She is alert and oriented to person, place, and time.  Skin: Skin is warm and dry.  Psychiatric: She has a normal mood and affect.  Nursing note and vitals reviewed.   BP 102/68   Pulse 97   Temp 98.3 F (36.8 C) (Oral)   Ht 5' 3.5" (1.613 m)   Wt 126 lb (57.2 kg)   LMP 07/31/1990   SpO2 98%   BMI 21.97 kg/m  Wt Readings from Last 3 Encounters:  02/17/16 126 lb (57.2 kg)  12/17/15 126 lb 2 oz (57.2 kg)  12/03/15 123 lb (55.8 kg)     Lab Results  Component Value Date   WBC 5.5 02/04/2016   HGB 9.1 (L) 02/04/2016   HCT 28.1 (L) 02/04/2016   PLT 471 (H) 02/04/2016   GLUCOSE 106 02/04/2016   CHOL 164 12/17/2015   TRIG 134.0 12/17/2015   HDL 41.10 12/17/2015   LDLCALC 96 12/17/2015   ALT <9 02/04/2016   AST 17 02/04/2016   NA 140 02/04/2016   K 3.6 02/04/2016   CL 106 12/17/2015   CREATININE 0.7 02/04/2016   BUN 11.1 02/04/2016   CO2 26 02/04/2016   TSH 16.73 (H) 12/17/2015   HGBA1C 5.4 12/17/2015    Lab Results  Component Value Date   TSH 16.73 (H) 12/17/2015   Lab Results  Component  Value Date   WBC 5.5 02/04/2016   HGB 9.1 (L) 02/04/2016   HCT 28.1 (L) 02/04/2016   MCV 130 (H) 02/04/2016   PLT 471 (H) 02/04/2016   Lab Results  Component Value Date   NA 140 02/04/2016   K 3.6 02/04/2016   CHLORIDE 104 02/04/2016   CO2 26 02/04/2016   GLUCOSE 106 02/04/2016   BUN 11.1 02/04/2016   CREATININE 0.7 02/04/2016   BILITOT <0.30 02/04/2016   ALKPHOS 98 02/04/2016   AST 17 02/04/2016   ALT <9  02/04/2016   PROT 7.3 02/04/2016   ALBUMIN 3.1 (L) 02/04/2016   CALCIUM 8.9 02/04/2016   ANIONGAP 10 02/04/2016   EGFR >90 02/04/2016   GFR 92.62 12/17/2015   Lab Results  Component Value Date   CHOL 164 12/17/2015   Lab Results  Component Value Date   HDL 41.10 12/17/2015   Lab Results  Component Value Date   LDLCALC 96 12/17/2015   Lab Results  Component Value Date   TRIG 134.0 12/17/2015   Lab Results  Component Value Date   CHOLHDL 4 12/17/2015   Lab Results  Component Value Date   HGBA1C 5.4 12/17/2015       Assessment & Plan:   Problem List Items Addressed This Visit    Macrocytic anemia    Increase leafy greens, consider increased lean red meat and using cast iron cookware. Continue to monitor, report any concerns      Esophageal reflux    Avoid offending foods, take probiotics. Do not eat large meals in late evening and consider raising head of bed.       Relevant Medications   meclizine (ANTIVERT) 12.5 MG tablet   Constipation    Encouraged hydration and fiber      Hyperglycemia    minimize simple carbs. Increase exercise as tolerated.       Relevant Medications   HYDROcodone-acetaminophen (NORCO) 5-325 MG tablet   furosemide (LASIX) 20 MG tablet   meclizine (ANTIVERT) 12.5 MG tablet   Other Relevant Orders   Vitamin D (25 hydroxy)   Comp Met (CMET)   Vitamin D deficiency   Relevant Medications   HYDROcodone-acetaminophen (NORCO) 5-325 MG tablet   furosemide (LASIX) 20 MG tablet   meclizine (ANTIVERT) 12.5 MG tablet   Other Relevant Orders   Vitamin D (25 hydroxy)   Comp Met (CMET)   Dysuria - Primary   Relevant Medications   HYDROcodone-acetaminophen (NORCO) 5-325 MG tablet   furosemide (LASIX) 20 MG tablet   meclizine (ANTIVERT) 12.5 MG tablet   Other Relevant Orders   Vitamin D (25 hydroxy)   Comp Met (CMET)   Essential hypertension    Well controlled, no changes to meds. Encouraged heart healthy diet such as the DASH diet and  exercise as tolerated.       Relevant Medications   HYDROcodone-acetaminophen (NORCO) 5-325 MG tablet   furosemide (LASIX) 20 MG tablet   meclizine (ANTIVERT) 12.5 MG tablet   Other Relevant Orders   Vitamin D (25 hydroxy)   Comp Met (CMET)   Blood in stool    hemeoccult negative. If symptoms recur will need to follow up with gastroenterology      Relevant Orders   Fecal occult blood, imunochemical (Completed)   Pedal edema    Elevate feet, consider compression hose. Minimize sodium. Increase Furosemide as needed prn      Benign paroxysmal positional vertigo    Hydrate well, consider PT if recurs, has improved  some since yesterday. Meclizine prn       Other Visit Diagnoses   None.     I have discontinued Ms. Gurney's sulfamethoxazole-trimethoprim. I have also changed her furosemide. Additionally, I am having her start on meclizine. Lastly, I am having her maintain her Multiple Vitamin (MULTIVITAMIN PO), fluorometholone, aspirin EC, Fish Oil, Probiotic Product (PROBIOTIC DAILY PO), folic acid, Calcium Carbonate-Vitamin D (CALTRATE COLON HEALTH PO), Cranberry, lactulose, estradiol, hydroxyurea, fluticasone, Spacer/Aero-Holding Chambers, loratadine, levothyroxine, cyclobenzaprine, fluticasone, venlafaxine XR, venlafaxine XR, RESTASIS, ondansetron, ranitidine, citalopram, montelukast, omeprazole, ALPRAZolam, promethazine, Vitamin D (Ergocalciferol), albuterol, naproxen, and HYDROcodone-acetaminophen.  Meds ordered this encounter  Medications  . HYDROcodone-acetaminophen (NORCO) 5-325 MG tablet    Sig: Take 1 tablet by mouth every 6 (six) hours as needed for moderate pain. Take one tablet tab po tid.    Dispense:  120 tablet    Refill:  0  . furosemide (LASIX) 20 MG tablet    Sig: Take 1 tablet (20 mg total) by mouth 2 (two) times daily as needed for fluid or edema.    Dispense:  90 tablet    Refill:  1  . meclizine (ANTIVERT) 12.5 MG tablet    Sig: Take 1 tablet (12.5 mg total)  by mouth 3 (three) times daily as needed for dizziness or nausea.    Dispense:  10 tablet    Refill:  0     Penni Homans, MD

## 2016-03-15 ENCOUNTER — Ambulatory Visit (HOSPITAL_BASED_OUTPATIENT_CLINIC_OR_DEPARTMENT_OTHER): Payer: BLUE CROSS/BLUE SHIELD | Admitting: Hematology & Oncology

## 2016-03-15 ENCOUNTER — Other Ambulatory Visit (HOSPITAL_BASED_OUTPATIENT_CLINIC_OR_DEPARTMENT_OTHER): Payer: BLUE CROSS/BLUE SHIELD

## 2016-03-15 VITALS — BP 111/75 | HR 82 | Temp 98.0°F | Resp 20 | Wt 124.0 lb

## 2016-03-15 DIAGNOSIS — D473 Essential (hemorrhagic) thrombocythemia: Secondary | ICD-10-CM | POA: Diagnosis not present

## 2016-03-15 DIAGNOSIS — D509 Iron deficiency anemia, unspecified: Secondary | ICD-10-CM

## 2016-03-15 DIAGNOSIS — E039 Hypothyroidism, unspecified: Secondary | ICD-10-CM

## 2016-03-15 DIAGNOSIS — D508 Other iron deficiency anemias: Secondary | ICD-10-CM

## 2016-03-15 LAB — CBC WITH DIFFERENTIAL (CANCER CENTER ONLY)
BASO#: 0 10*3/uL (ref 0.0–0.2)
BASO%: 0.2 % (ref 0.0–2.0)
EOS%: 4.8 % (ref 0.0–7.0)
Eosinophils Absolute: 0.2 10*3/uL (ref 0.0–0.5)
HCT: 30.2 % — ABNORMAL LOW (ref 34.8–46.6)
HGB: 10 g/dL — ABNORMAL LOW (ref 11.6–15.9)
LYMPH#: 1 10*3/uL (ref 0.9–3.3)
LYMPH%: 23.7 % (ref 14.0–48.0)
MCH: 42.9 pg — ABNORMAL HIGH (ref 26.0–34.0)
MCHC: 33.1 g/dL (ref 32.0–36.0)
MCV: 130 fL — ABNORMAL HIGH (ref 81–101)
MONO#: 0.2 10*3/uL (ref 0.1–0.9)
MONO%: 5.7 % (ref 0.0–13.0)
NEUT#: 2.7 10*3/uL (ref 1.5–6.5)
NEUT%: 65.6 % (ref 39.6–80.0)
Platelets: 355 10*3/uL (ref 145–400)
RBC: 2.33 10*6/uL — ABNORMAL LOW (ref 3.70–5.32)
RDW: 12.8 % (ref 11.1–15.7)
WBC: 4.2 10*3/uL (ref 3.9–10.0)

## 2016-03-15 LAB — IRON AND TIBC
%SAT: 40 % (ref 21–57)
Iron: 69 ug/dL (ref 41–142)
TIBC: 171 ug/dL — ABNORMAL LOW (ref 236–444)
UIBC: 103 ug/dL — ABNORMAL LOW (ref 120–384)

## 2016-03-15 LAB — FERRITIN: Ferritin: 663 ng/ml — ABNORMAL HIGH (ref 9–269)

## 2016-03-15 LAB — RETICULOCYTES: Reticulocyte Count: 1.2 % (ref 0.6–2.6)

## 2016-03-15 LAB — CHCC SATELLITE - SMEAR

## 2016-03-15 NOTE — Progress Notes (Signed)
Hematology and Oncology Follow Up Visit  Rachael Johnson [name] 1950/08/10 65 y.o. 03/15/2016   Principle Diagnosis:  Essential thrombocythemia - JAK2 negative. 2. Intermittent iron-deficiency anemia.  Current Therapy:   Hydrea  1000 mg by mouth daily      Interim History:  [name] is back for followup.  She looks quite good. She's been doing a lot of things since she is retired. She is able to do what she wants to do now. She is doing work for [employer]. She is helping out her family.  She is doing well with the Hydrea. She's had no nausea or vomiting.  Her iron studies were down a little bit we last saw her in July. As such, we gave her 2 doses of IV iron. She felt a little better afterwards.   She's had no bleeding or bruising. There's been no change in bowel or bladder habits. She's had no cough. She's had occasional leg swelling. She takes Lasix which I think helps.    Overall, her performance status is ECOG 2.   Medications:  Current Outpatient Prescriptions:  .  albuterol (VENTOLIN HFA) 108 (90 Base) MCG/ACT inhaler, INHALE 2 PUFFS INTO THE LUNGS EVERY 6 (SIX) HOURS AS  NEEDED FOR WHEEZI NG OR SHORTNESS OF BREATH., Disp: 18 g, Rfl: 8 .  ALPRAZolam (XANAX) 1 MG tablet, TAKE 1 TABLET THREE TIMES A DAY AS NEEDED FOR ANXIETY, Disp: 90 tablet, Rfl: 01 .  aspirin EC 81 MG tablet, Take 81 mg by mouth every morning., Disp: , Rfl:  .  Calcium Carbonate-Vitamin D (CALTRATE COLON HEALTH PO), Take by mouth., Disp: , Rfl:  .  citalopram (CELEXA) 40 MG tablet, TAKE  ONE TABLET BY MOUTH EVERY MORNING, Disp: 30 tablet, Rfl: 3 .  Cranberry 300 MG tablet, Take 300 mg by mouth 2 (two) times daily., Disp: , Rfl:  .  cyclobenzaprine (FLEXERIL) 10 MG tablet, Take 1 tablet (10 mg total) by mouth 2 (two) times daily as needed for muscle spasms., Disp: 60 tablet, Rfl: 3 .  estradiol (ESTRACE) 0.5 MG tablet, TAKE ONE TABLET BY MOUTH TWICE DAILY, Disp: 180 tablet, Rfl: 1 .  fluorometholone  (FML) 0.1 % ophthalmic suspension, Place 1 drop into both eyes daily as needed (For dry eyes.). , Disp: , Rfl:  .  fluticasone (FLONASE) 50 MCG/ACT nasal spray, Place 2 sprays into both nostrils daily., Disp: 16 g, Rfl: 6 .  fluticasone (FLOVENT HFA) 110 MCG/ACT inhaler, Inhale 2 puffs into the lungs 2 (two) times daily., Disp: 1 Inhaler, Rfl: 12 .  folic acid (FOLVITE) 1 MG tablet, Take 1 mg by mouth daily., Disp: , Rfl:  .  furosemide (LASIX) 20 MG tablet, Take 1 tablet (20 mg total) by mouth 2 (two) times daily as needed for fluid or edema., Disp: 90 tablet, Rfl: 1 .  HYDROcodone-acetaminophen (NORCO) 5-325 MG tablet, Take 1 tablet by mouth every 6 (six) hours as needed for moderate pain. Take one tablet tab po tid., Disp: 120 tablet, Rfl: 0 .  hydroxyurea (HYDREA) 500 MG capsule, Take 2 capsules (1,000 mg total) by mouth daily. 90 day supply., Disp: 90 capsule, Rfl: 3 .  lactulose (CHRONULAC) 10 GM/15ML solution, Please take 2 tablespoons 3 times a day for constipation., Disp: 1000 mL, Rfl: 8 .  levothyroxine (SYNTHROID, LEVOTHROID) 88 MCG tablet, Take 1 tablet daily, Disp: 30 tablet, Rfl: 5 .  loratadine (CLARITIN) 10 MG tablet, TAKE 1 TABLET BY MOUTH TWICE A DAY AS NEEDED  FOR ALLERGIES OR ITCHING, Disp: 60 tablet, Rfl: 9 .  meclizine (ANTIVERT) 12.5 MG tablet, Take 1 tablet (12.5 mg total) by mouth 3 (three) times daily as needed for dizziness or nausea., Disp: 10 tablet, Rfl: 0 .  montelukast (SINGULAIR) 10 MG tablet, TAKE ONE TABLET BY MOUTH AT BEDTIME, Disp: 30 tablet, Rfl: 5 .  Multiple Vitamin (MULTIVITAMIN PO), Take 1 tablet by mouth every morning. , Disp: , Rfl:  .  naproxen (NAPROSYN) 500 MG tablet, TAKE 1 TABLET (500 MG TOTAL) BY MOUTH 2 (TWO) TIMES DAILY WITH A MEAL., Disp: 180 tablet, Rfl: 0 .  Omega-3 Fatty Acids (FISH OIL) 1000 MG CAPS, Take by mouth 2 (two) times daily., Disp: , Rfl:  .  omeprazole (PRILOSEC) 40 MG capsule, TAKE 1 CAPSULE (40 MG TOTAL)     BY MOUTH 2 (TWO) TIMES  DAILY., Disp: 60 capsule, Rfl: 6 .  ondansetron (ZOFRAN) 8 MG tablet, TAKE ONE TABLET BY MOUTH EVERY 8 HOURS AS NEEDED NAUSEA AND VOMITING, Disp: 20 tablet, Rfl: 0 .  pramipexole (MIRAPEX) 0.25 MG tablet, TAKE ONE OR TWO TABLETS BY MOUTH AT BEDTIME AS NEEDED, Disp: 60 tablet, Rfl: 1 .  Probiotic Product (PROBIOTIC DAILY PO), Take by mouth 2 (two) times daily., Disp: , Rfl:  .  promethazine (PHENERGAN) 25 MG tablet, Take 1 tablet (25 mg total) by mouth every 6 (six) hours as needed for nausea or vomiting., Disp: 60 tablet, Rfl: 4 .  ranitidine (ZANTAC) 300 MG tablet, TAKE 1 TABLET BY MOUTH AT BEDTIME AS NEEDED FOR HEARTBURN, Disp: 30 tablet, Rfl: 5 .  RESTASIS 0.05 % ophthalmic emulsion, , Disp: , Rfl: 3 .  Spacer/Aero-Holding Chambers DEVI, 1 Dose by Does not apply route 2 (two) times daily as needed., Disp: 1 each, Rfl: 0 .  venlafaxine XR (EFFEXOR-XR) 150 MG 24 hr capsule, Take 1 capsule (150 mg total) by mouth daily with breakfast., Disp: 90 capsule, Rfl: 1 .  venlafaxine XR (EFFEXOR-XR) 75 MG 24 hr capsule, TAKE ONE CAPSULE BY MOUTH DAILY WITH BREAKFAST, Disp: 90 capsule, Rfl: 1 .  Vitamin D, Ergocalciferol, (DRISDOL) 50000 units CAPS capsule, TAKE 1 CAPSULE BY MOUTH EVERY OTHER WEEK, Disp: 6 capsule, Rfl: 0  Allergies: No Known Allergies  Past Medical History, Surgical history, Social history, and Family History were reviewed and updated.  Review of Systems: As above  Physical Exam:  weight is 124 lb (56.2 kg). Her oral temperature is 98 F (36.7 C). Her blood pressure is 111/75 and her pulse is 82. Her respiration is 20.   Thin white female. Head and neck exam shows no ocular or oral lesions. She has some slight temporal muscle wasting. There is no adenopathy in her neck. Lungs are clear. Cardiac exam regular rate and rhythm. No murmurs. Abdomen is soft. She has good bowel sounds. There is no fluid wave. There is no palpable liver or spleen. Back exam shows no tenderness over the spine  ribs or hips. Extremities shows mild chronic nonpitting edema. she has good range of motion of her joints. She has a compression sleeve on her right arm. There is no swelling of the right arm. There is no cyanosis of her lower extremities. She has good pulses. . Skin exam no rashes. Neurological exam no focal deficits.  Lab Results  Component Value Date   WBC 4.2 03/15/2016   HGB 10.0 (L) 03/15/2016   HCT 30.2 (L) 03/15/2016   MCV 130 (H) 03/15/2016   PLT 355 03/15/2016  Chemistry      Component Value Date/Time   NA 140 02/04/2016 0929   K 3.6 02/04/2016 0929   CL 106 12/17/2015 0803   CL 97 (L) 09/10/2014 0743   CO2 26 02/04/2016 0929   BUN 11.1 02/04/2016 0929   CREATININE 0.7 02/04/2016 0929      Component Value Date/Time   CALCIUM 8.9 02/04/2016 0929   ALKPHOS 98 02/04/2016 0929   AST 17 02/04/2016 0929   ALT <9 02/04/2016 0929   BILITOT <0.30 02/04/2016 0929         Impression and Plan: Ms. Kinderman is 65 year old white female with essential thrombocythemia. I'm somewhat concerned about her anemia and that he can go back up. Her MCV certainly is quite high so she is taking the Hydrea.  From my point of view, everything looks a little bit better. As such, I think we can just follow her along still. I do not think that we have to do a bone marrow test.  She is pretty much asymptomatic with this right now.  I will plan to see her back in another couple months.    [name], MD 8/16/201710:22 AM

## 2016-03-17 ENCOUNTER — Other Ambulatory Visit: Payer: BLUE CROSS/BLUE SHIELD

## 2016-03-17 ENCOUNTER — Ambulatory Visit: Payer: BLUE CROSS/BLUE SHIELD | Admitting: Hematology & Oncology

## 2016-03-18 ENCOUNTER — Other Ambulatory Visit: Payer: Self-pay | Admitting: Family Medicine

## 2016-03-22 ENCOUNTER — Other Ambulatory Visit: Payer: Self-pay | Admitting: Family Medicine

## 2016-03-22 DIAGNOSIS — R739 Hyperglycemia, unspecified: Secondary | ICD-10-CM

## 2016-03-22 DIAGNOSIS — E559 Vitamin D deficiency, unspecified: Secondary | ICD-10-CM

## 2016-03-22 DIAGNOSIS — I10 Essential (primary) hypertension: Secondary | ICD-10-CM

## 2016-03-22 DIAGNOSIS — R300 Dysuria: Secondary | ICD-10-CM

## 2016-03-22 NOTE — Telephone Encounter (Signed)
Meclizine 10mg  #10x0 request sent to pharmacy; Effexor XR request Denied, Too Soon; last Rx to pharmacy 11/15/15 #90x1/SLS 08/23

## 2016-03-23 ENCOUNTER — Other Ambulatory Visit: Payer: Self-pay | Admitting: Family Medicine

## 2016-03-23 MED ORDER — ESTRADIOL 0.5 MG PO TABS: 0.5000 mg | ORAL_TABLET | Freq: Two times a day (BID) | ORAL | 1 refills | Status: DC

## 2016-03-23 MED ORDER — VENLAFAXINE HCL ER 75 MG PO CP24: ORAL_CAPSULE | ORAL | 1 refills | Status: DC

## 2016-03-27 ENCOUNTER — Telehealth: Payer: Self-pay | Admitting: Family Medicine

## 2016-03-27 MED ORDER — OPTICHAMBER DIAMOND MISC: 1.0000 | Freq: Once | 1 refills | Status: DC

## 2016-03-27 NOTE — Telephone Encounter (Signed)
Spacer chamber sent to the pharmacy

## 2016-03-27 NOTE — Telephone Encounter (Signed)
OK to write her a prescription for the spacer chamber she is requesting for copd.

## 2016-03-27 NOTE — Telephone Encounter (Signed)
Caller name: Jasman Relationship to patient: self Can be reached: [phone] Pharmacy: [id number] - [place], [place]  Reason for call: Pt requesting new order for optichamber diamond made by [employer] be sent into pharmacy for her. She lost hers and needs this particular one to use her inhaler. Pt requesting call when RX is sent in.

## 2016-03-27 NOTE — Telephone Encounter (Signed)
Advise on this request please.

## 2016-03-30 ENCOUNTER — Telehealth: Payer: Self-pay | Admitting: Family Medicine

## 2016-03-30 MED ORDER — OPTICHAMBER DIAMOND MISC: 1.0000 | Freq: Once | 0 refills | Status: AC

## 2016-03-30 NOTE — Telephone Encounter (Signed)
Patient returning your call.

## 2016-03-30 NOTE — Addendum Note (Signed)
Addended by: Sharon Seller B on: 03/30/2016 04:25 PM   Modules accepted: Orders

## 2016-03-30 NOTE — Telephone Encounter (Signed)
Called the patient left msg. To get clarification on request.

## 2016-03-30 NOTE — Telephone Encounter (Signed)
Sent in and patient informed 

## 2016-03-30 NOTE — Telephone Encounter (Signed)
Pt called in to request a different oxygen mask. She says that the one that she received doesn't fit her face. She says that her previous one read OPTI chambers .    She would like a follow up call back to confirm / discuss further.

## 2016-04-10 ENCOUNTER — Other Ambulatory Visit: Payer: Self-pay | Admitting: Family Medicine

## 2016-04-18 ENCOUNTER — Ambulatory Visit (INDEPENDENT_AMBULATORY_CARE_PROVIDER_SITE_OTHER): Payer: BLUE CROSS/BLUE SHIELD | Admitting: Family Medicine

## 2016-04-18 ENCOUNTER — Other Ambulatory Visit: Payer: Self-pay | Admitting: Family Medicine

## 2016-04-18 ENCOUNTER — Telehealth: Payer: Self-pay | Admitting: Family Medicine

## 2016-04-18 ENCOUNTER — Ambulatory Visit (HOSPITAL_BASED_OUTPATIENT_CLINIC_OR_DEPARTMENT_OTHER)
Admission: RE | Admit: 2016-04-18 | Discharge: 2016-04-18 | Disposition: A | Discharge status: Home / Self Care | Payer: BLUE CROSS/BLUE SHIELD | Source: Ambulatory Visit | Attending: Family Medicine | Admitting: Family Medicine

## 2016-04-18 VITALS — BP 126/81 | HR 68 | Temp 97.7°F | Wt 128.6 lb

## 2016-04-18 DIAGNOSIS — R300 Dysuria: Secondary | ICD-10-CM

## 2016-04-18 DIAGNOSIS — R05 Cough: Secondary | ICD-10-CM | POA: Diagnosis not present

## 2016-04-18 DIAGNOSIS — Z23 Encounter for immunization: Secondary | ICD-10-CM | POA: Diagnosis not present

## 2016-04-18 DIAGNOSIS — R059 Cough, unspecified: Secondary | ICD-10-CM | POA: Insufficient documentation

## 2016-04-18 DIAGNOSIS — E039 Hypothyroidism, unspecified: Secondary | ICD-10-CM

## 2016-04-18 DIAGNOSIS — D473 Essential (hemorrhagic) thrombocythemia: Secondary | ICD-10-CM

## 2016-04-18 DIAGNOSIS — I10 Essential (primary) hypertension: Secondary | ICD-10-CM

## 2016-04-18 DIAGNOSIS — R0602 Shortness of breath: Secondary | ICD-10-CM | POA: Insufficient documentation

## 2016-04-18 DIAGNOSIS — R739 Hyperglycemia, unspecified: Secondary | ICD-10-CM

## 2016-04-18 DIAGNOSIS — R918 Other nonspecific abnormal finding of lung field: Secondary | ICD-10-CM | POA: Insufficient documentation

## 2016-04-18 DIAGNOSIS — E559 Vitamin D deficiency, unspecified: Secondary | ICD-10-CM | POA: Diagnosis not present

## 2016-04-18 DIAGNOSIS — K219 Gastro-esophageal reflux disease without esophagitis: Secondary | ICD-10-CM

## 2016-04-18 MED ORDER — BENZONATATE 200 MG PO CAPS: 200.0000 mg | ORAL_CAPSULE | Freq: Every evening | ORAL | 1 refills | Status: DC | PRN

## 2016-04-18 MED ORDER — FUROSEMIDE 20 MG PO TABS: 20.0000 mg | ORAL_TABLET | Freq: Two times a day (BID) | ORAL | 1 refills | Status: DC | PRN

## 2016-04-18 MED ORDER — CYCLOBENZAPRINE HCL 10 MG PO TABS: 10.0000 mg | ORAL_TABLET | Freq: Two times a day (BID) | ORAL | 3 refills | Status: DC | PRN

## 2016-04-18 MED ORDER — ESTRADIOL 0.5 MG PO TABS: 0.5000 mg | ORAL_TABLET | Freq: Two times a day (BID) | ORAL | 1 refills | Status: DC

## 2016-04-18 MED ORDER — HYDROXYUREA 500 MG PO CAPS: 1000.0000 mg | ORAL_CAPSULE | Freq: Every day | ORAL | 3 refills | Status: DC

## 2016-04-18 MED ORDER — LEVOFLOXACIN 500 MG PO TABS: 500.0000 mg | ORAL_TABLET | Freq: Every day | ORAL | 0 refills | Status: DC

## 2016-04-18 MED ORDER — PRAMIPEXOLE DIHYDROCHLORIDE 0.25 MG PO TABS: ORAL_TABLET | ORAL | 1 refills | Status: DC

## 2016-04-18 MED ORDER — FLUTICASONE PROPIONATE HFA 110 MCG/ACT IN AERO: 2.0000 | INHALATION_SPRAY | Freq: Two times a day (BID) | RESPIRATORY_TRACT | 12 refills | Status: DC

## 2016-04-18 MED ORDER — CITALOPRAM HYDROBROMIDE 40 MG PO TABS: 40.0000 mg | ORAL_TABLET | Freq: Every morning | ORAL | 3 refills | Status: DC

## 2016-04-18 MED ORDER — ALBUTEROL SULFATE HFA 108 (90 BASE) MCG/ACT IN AERS: INHALATION_SPRAY | RESPIRATORY_TRACT | 8 refills | Status: DC

## 2016-04-18 MED ORDER — NAPROXEN 500 MG PO TABS: ORAL_TABLET | ORAL | 1 refills | Status: DC

## 2016-04-18 MED ORDER — ALPRAZOLAM 1 MG PO TABS: ORAL_TABLET | ORAL | Status: DC

## 2016-04-18 MED ORDER — PANTOPRAZOLE SODIUM 40 MG PO TBEC: 40.0000 mg | DELAYED_RELEASE_TABLET | Freq: Every day | ORAL | 5 refills | Status: DC

## 2016-04-18 MED ORDER — MECLIZINE HCL 12.5 MG PO TABS: ORAL_TABLET | ORAL | 0 refills | Status: DC

## 2016-04-18 MED ORDER — CETIRIZINE HCL 10 MG PO TABS: 10.0000 mg | ORAL_TABLET | Freq: Every day | ORAL | 11 refills | Status: DC

## 2016-04-18 MED ORDER — HYDROCODONE-ACETAMINOPHEN 5-325 MG PO TABS: 1.0000 | ORAL_TABLET | Freq: Four times a day (QID) | ORAL | 0 refills | Status: DC | PRN

## 2016-04-18 MED ORDER — FLUTICASONE PROPIONATE 50 MCG/ACT NA SUSP: 2.0000 | Freq: Every day | NASAL | 6 refills | Status: DC

## 2016-04-18 MED ORDER — VENLAFAXINE HCL ER 150 MG PO CP24: 150.0000 mg | ORAL_CAPSULE | Freq: Every day | ORAL | 1 refills | Status: DC

## 2016-04-18 MED ORDER — VENLAFAXINE HCL ER 75 MG PO CP24: ORAL_CAPSULE | ORAL | 1 refills | Status: DC

## 2016-04-18 MED ORDER — MONTELUKAST SODIUM 10 MG PO TABS: 10.0000 mg | ORAL_TABLET | Freq: Every day | ORAL | 5 refills | Status: DC

## 2016-04-18 MED ORDER — RANITIDINE HCL 300 MG PO TABS: 300.0000 mg | ORAL_TABLET | Freq: Every day | ORAL | 5 refills | Status: DC

## 2016-04-18 MED ORDER — LEVOTHYROXINE SODIUM 88 MCG PO TABS: 88.0000 ug | ORAL_TABLET | Freq: Every day | ORAL | 5 refills | Status: DC

## 2016-04-18 NOTE — Patient Instructions (Addendum)
Food Choices for Gastroesophageal Reflux Disease, Adult When you have gastroesophageal reflux disease (GERD), the foods you eat and your eating habits are very important. Choosing the right foods can help ease the discomfort of GERD. WHAT GENERAL GUIDELINES DO I NEED TO FOLLOW?  Choose fruits, vegetables, whole grains, low-fat dairy products, and low-fat meat, fish, and poultry.  Limit fats such as oils, salad dressings, butter, nuts, and avocado.  Keep a food diary to identify foods that cause symptoms.  Avoid foods that cause reflux. These may be different for different people.  Eat frequent small meals instead of three large meals each day.  Eat your meals slowly, in a relaxed setting.  Limit fried foods.  Cook foods using methods other than frying.  Avoid drinking alcohol.  Avoid drinking large amounts of liquids with your meals.  Avoid bending over or lying down until 2-3 hours after eating. WHAT FOODS ARE NOT RECOMMENDED? The following are some foods and drinks that may worsen your symptoms: Vegetables Tomatoes. Tomato juice. Tomato and spaghetti sauce. Chili peppers. Onion and garlic. Horseradish. Fruits Oranges, grapefruit, and lemon (fruit and juice). Meats High-fat meats, fish, and poultry. This includes hot dogs, ribs, ham, sausage, salami, and bacon. Dairy Whole milk and chocolate milk. Sour cream. Cream. Butter. Ice cream. Cream cheese.  Beverages Coffee and tea, with or without caffeine. Carbonated beverages or energy drinks. Condiments Hot sauce. Barbecue sauce.  Sweets/Desserts Chocolate and cocoa. Donuts. Peppermint and spearmint. Fats and Oils High-fat foods, including French fries and potato chips. Other Vinegar. Strong spices, such as black pepper, white pepper, red pepper, cayenne, curry powder, cloves, ginger, and chili powder. The items listed above may not be a complete list of foods and beverages to avoid. Contact your dietitian for more  information.   This information is not intended to replace advice given to you by your health care provider. Make sure you discuss any questions you have with your health care provider.   Document Released: 07/17/2005 Document Revised: 08/07/2014 Document Reviewed: 05/21/2013 Elsevier Interactive Patient Education 2016 [employer]  

## 2016-04-18 NOTE — Progress Notes (Signed)
Pre visit review using our clinic review tool, if applicable. No additional management support is needed unless otherwise documented below in the visit note. 

## 2016-04-18 NOTE — Telephone Encounter (Signed)
[facility] [place] [facility] needing [facility] on hydrocodone prescription given to patient at her appiontment 04/18/16. There are two sets of instructions. Pharmacist stated MD must speak to the pharmacist on the phone due to being a controlled medication, cannot give instructions to assistant must speak directly to MD Call back number to pharmacy is [phone].

## 2016-04-19 MED ORDER — HYDROCODONE-ACETAMINOPHEN 5-325 MG PO TABS: 1.0000 | ORAL_TABLET | Freq: Four times a day (QID) | ORAL | 0 refills | Status: DC | PRN

## 2016-04-19 NOTE — Telephone Encounter (Signed)
Please rewrite the prescription for# Hydrocodone to just say 1 tab po q 6 hours prn severe pain. Disp #120

## 2016-04-19 NOTE — Telephone Encounter (Signed)
Rx has been changed to message below. PC

## 2016-04-20 NOTE — Telephone Encounter (Signed)
Called the patient to come into the office to pickup the corrected hardcopy Cell number mail box full/no answer and no answer on home number

## 2016-04-21 NOTE — Telephone Encounter (Signed)
Spoke to the patient informed  Of information regarding this refill. The patient will come back to get the corrected prescription, but did state may be a few weeks before she gets out here, will attach note on prescription so does not get thrown out.

## 2016-04-25 ENCOUNTER — Other Ambulatory Visit: Payer: Self-pay | Admitting: *Deleted

## 2016-04-25 ENCOUNTER — Encounter: Payer: Self-pay | Admitting: *Deleted

## 2016-04-25 DIAGNOSIS — D473 Essential (hemorrhagic) thrombocythemia: Secondary | ICD-10-CM

## 2016-04-25 MED ORDER — HYDROXYUREA 500 MG PO CAPS: 1000.0000 mg | ORAL_CAPSULE | Freq: Every day | ORAL | 3 refills | Status: DC

## 2016-04-27 ENCOUNTER — Other Ambulatory Visit: Payer: Self-pay | Admitting: Hematology & Oncology

## 2016-04-27 ENCOUNTER — Other Ambulatory Visit: Payer: Self-pay | Admitting: Family Medicine

## 2016-04-27 DIAGNOSIS — I10 Essential (primary) hypertension: Secondary | ICD-10-CM

## 2016-04-27 DIAGNOSIS — R300 Dysuria: Secondary | ICD-10-CM

## 2016-04-27 DIAGNOSIS — E559 Vitamin D deficiency, unspecified: Secondary | ICD-10-CM

## 2016-04-27 DIAGNOSIS — R739 Hyperglycemia, unspecified: Secondary | ICD-10-CM

## 2016-04-27 DIAGNOSIS — K5901 Slow transit constipation: Secondary | ICD-10-CM

## 2016-04-27 DIAGNOSIS — D473 Essential (hemorrhagic) thrombocythemia: Secondary | ICD-10-CM

## 2016-04-28 ENCOUNTER — Telehealth: Payer: Self-pay | Admitting: Family Medicine

## 2016-04-28 MED ORDER — VENLAFAXINE HCL ER 75 MG PO CP24: ORAL_CAPSULE | ORAL | 1 refills | Status: DC

## 2016-04-28 MED ORDER — CETIRIZINE HCL 10 MG PO TABS: 10.0000 mg | ORAL_TABLET | Freq: Every day | ORAL | 1 refills | Status: DC

## 2016-04-28 MED ORDER — VENLAFAXINE HCL ER 150 MG PO CP24
150.0000 mg | ORAL_CAPSULE | Freq: Every day | ORAL | 1 refills | Status: DC
Start: 2016-04-28 — End: 2016-08-28

## 2016-04-28 NOTE — Telephone Encounter (Signed)
Refills done and patient advised.

## 2016-04-28 NOTE — Telephone Encounter (Signed)
Patient is requesting a refill of venlafaxine XR (EFFEXOR-XR) 150 MG 24 hr capsule and venlafaxine XR (EFFEXOR-XR) 75 MG 24 hr capsule Patient states that the pharmacy messed up the prescription and a new one will need to be sent. She would like a phone call when it is sent over so she knows when to go pick it up.   Patient Relation: Self Patient phone: [phone] [id number] [place], [place]

## 2016-04-29 ENCOUNTER — Encounter: Payer: Self-pay | Admitting: Family Medicine

## 2016-04-29 DIAGNOSIS — J189 Pneumonia, unspecified organism: Secondary | ICD-10-CM | POA: Insufficient documentation

## 2016-04-29 NOTE — Assessment & Plan Note (Signed)
On Levothyroxine, continue to monitor 

## 2016-04-29 NOTE — Assessment & Plan Note (Signed)
hgba1c acceptable, minimize simple carbs. Increase exercise as tolerated.  

## 2016-04-29 NOTE — Assessment & Plan Note (Signed)
Xray reveals likely pneumonia, start Levaquin and Tessalon Perles and increase rest and hydration. Repeat CXR in 6-8 weeks.

## 2016-04-29 NOTE — Progress Notes (Signed)
Patient ID: Rachael Johnson, female   DOB: 1951-03-28, 65 y.o.   MRN: [id number]   Subjective:    Patient ID: Rachael Johnson, female    DOB: 1950/10/06, 65 y.o.   MRN: [id number]  Chief Complaint  Patient presents with  . Follow-up    HPI Patient is in today for follow up. She is struggling with a worsening cough. Non productive but keeping her up at night. She is unable to lie down without coughing and feeling SOB. Has been getting worse for several weeks. No fevers or chills. Notes fatigue and malaise. Denies CP/palp/HA/fevers/GI or GU c/o. Taking meds as prescribed  Past Medical History:  Diagnosis Date  . Acute pharyngitis 07/13/2014  . Allergy   . Anemia, iron deficiency 07/08/2012  . Arthritis   . Cataract 12/21/2014  . Cerumen impaction 04/28/2013  . Chest pain   . Depression   . Depression with anxiety 03/24/2011  . Diverticulitis   . Esophageal reflux 08/17/2013  . Essential thrombocythemia ([place]) 01/24/2011  . Frequent episodic tension-type headache   . Gastroenteritis 12/21/2014  . 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 GERD (gastroesophageal reflux disease)   . H. pylori infection 12/19/2012  . Hyperglycemia 12/17/2015  . Hyperglycemia 12/17/2015  . Hypertension   . Normal cardiac stress test   . Other and unspecified hyperlipidemia 02/25/2013  . Pedal edema 03/07/2016  . Pneumonia 04/29/2016  . Preventative health care 04/20/2013  . Restless leg syndrome 09/30/2014  . Scabies 03/28/2015  . Seizure ([place])    childhood  . Thyroid disease     Past Surgical History:  Procedure Laterality Date  . ABDOMINAL HYSTERECTOMY     1992  . BREAST SURGERY  1992   biopsy, benign. Fibrocystic  . UMBILICAL HERNIA REPAIR N/A 09/25/2012   Procedure: HERNIA REPAIR UMBILICAL ADULT;  Surgeon: Harl Bowie, MD;  Location: [facility];  Service: General;  Laterality: N/A;    Family History  Problem Relation Age of Onset  . Arthritis Mother   . Cancer Mother     ovarian  . Hypertension Mother   .  Heart disease Mother     pacer  . Heart failure Mother   . Arthritis Father   . Cancer Father     lung  . Hypertension Father   . Cancer Sister     lung  . Cancer Brother     prostate  . Cancer Brother     lung  . Hypertension Son   . COPD Brother   . Heart disease Brother   . Hypertension Brother   . Cancer Brother     colon  . Alcohol abuse Brother   . Cirrhosis Brother   . Seizures Sister   . Stroke Sister   . Arthritis Brother     Social History   Social History  . Marital status: Widowed    Spouse name: N/A  . Number of children: 2  . Years of education: N/A   Occupational History  . APL operator [employer]   Social History Main Topics  . Smoking status: Former Smoker    Packs/day: 3.50    Years: 20.00    Types: Cigarettes    Start date: 11/15/1970    Quit date: 07/31/1990  . Smokeless tobacco: Never Used     Comment: quit 23 years ago  . Alcohol use No  . Drug use: No  . Sexual activity: No   Other Topics Concern  . Not  on file   Social History Narrative   Regular exercise: no   Caffeine Use: 2-3 weekly    Outpatient Medications Prior to Visit  Medication Sig Dispense Refill  . aspirin EC 81 MG tablet Take 81 mg by mouth every morning.    . Calcium Carbonate-Vitamin D (CALTRATE COLON HEALTH PO) Take by mouth.    . Cranberry 300 MG tablet Take 300 mg by mouth 2 (two) times daily.    . fluorometholone (FML) 0.1 % ophthalmic suspension Place 1 drop into both eyes daily as needed (For dry eyes.).     Marland Kitchen folic acid (FOLVITE) 1 MG tablet Take 1 mg by mouth daily.    . Multiple Vitamin (MULTIVITAMIN PO) Take 1 tablet by mouth every morning.     . Omega-3 Fatty Acids (FISH OIL) 1000 MG CAPS Take by mouth 2 (two) times daily.    . ondansetron (ZOFRAN) 8 MG tablet TAKE ONE TABLET BY MOUTH EVERY 8 HOURS AS NEEDED NAUSEA AND VOMITING 20 tablet 0  . Probiotic Product (PROBIOTIC DAILY PO) Take by mouth 2 (two) times daily.    . promethazine  (PHENERGAN) 25 MG tablet Take 1 tablet (25 mg total) by mouth every 6 (six) hours as needed for nausea or vomiting. 60 tablet 4  . ranitidine (ZANTAC) 300 MG tablet TAKE 1 TABLET BY MOUTH AT BEDTIME AS NEEDED FOR HEARTBURN 30 tablet 5  . RESTASIS 0.05 % ophthalmic emulsion   3  . Vitamin D, Ergocalciferol, (DRISDOL) 50000 units CAPS capsule TAKE 1 CAPSULE BY MOUTH EVERY OTHER WEEK 6 capsule 0  . albuterol (VENTOLIN HFA) 108 (90 Base) MCG/ACT inhaler INHALE 2 PUFFS INTO THE LUNGS EVERY 6 (SIX) HOURS AS  NEEDED FOR WHEEZI NG OR SHORTNESS OF BREATH. 18 g 8  . ALPRAZolam (XANAX) 1 MG tablet TAKE 1 TABLET THREE TIMES A DAY AS NEEDED FOR ANXIETY 90 tablet 01  . citalopram (CELEXA) 40 MG tablet TAKE ONE TABLET BY MOUTH EVERY MORNING 30 tablet 3  . cyclobenzaprine (FLEXERIL) 10 MG tablet Take 1 tablet (10 mg total) by mouth 2 (two) times daily as needed for muscle spasms. 60 tablet 3  . estradiol (ESTRACE) 0.5 MG tablet Take 1 tablet (0.5 mg total) by mouth 2 (two) times daily. 180 tablet 1  . fluticasone (FLONASE) 50 MCG/ACT nasal spray Place 2 sprays into both nostrils daily. 16 g 6  . fluticasone (FLOVENT HFA) 110 MCG/ACT inhaler Inhale 2 puffs into the lungs 2 (two) times daily. 1 Inhaler 12  . furosemide (LASIX) 20 MG tablet Take 1 tablet (20 mg total) by mouth 2 (two) times daily as needed for fluid or edema. 90 tablet 1  . HYDROcodone-acetaminophen (NORCO) 5-325 MG tablet Take 1 tablet by mouth every 6 (six) hours as needed for moderate pain. Take one tablet tab po tid. 120 tablet 0  . hydroxyurea (HYDREA) 500 MG capsule Take 2 capsules (1,000 mg total) by mouth daily. 90 day supply. 90 capsule 3  . lactulose (CHRONULAC) 10 GM/15ML solution Please take 2 tablespoons 3 times a day for constipation. 1000 mL 8  . levothyroxine (SYNTHROID, LEVOTHROID) 88 MCG tablet TAKE 1 TABLET DAILY 30 tablet 5  . loratadine (CLARITIN) 10 MG tablet TAKE 1 TABLET BY MOUTH TWICE A DAY AS NEEDED FOR ALLERGIES OR ITCHING 60  tablet 9  . meclizine (ANTIVERT) 12.5 MG tablet TAKE 1 TABLET THREE TIMES A DAY AS NEEDED FOR NAUSEA OR DIZZINESS 10 tablet 0  . montelukast (SINGULAIR) 10 MG  tablet TAKE ONE TABLET BY MOUTH AT BEDTIME 30 tablet 5  . naproxen (NAPROSYN) 500 MG tablet TAKE 1 TABLET (500 MG TOTAL) BY MOUTH 2 (TWO) TIMES DAILY WITH A MEAL. 180 tablet 0  . omeprazole (PRILOSEC) 40 MG capsule TAKE 1 CAPSULE (40 MG TOTAL)     BY MOUTH 2 (TWO) TIMES DAILY. 60 capsule 6  . pramipexole (MIRAPEX) 0.25 MG tablet TAKE ONE OR TWO TABLETS BY MOUTH AT BEDTIME AS NEEDED 60 tablet 1  . venlafaxine XR (EFFEXOR-XR) 150 MG 24 hr capsule Take 1 capsule (150 mg total) by mouth daily with breakfast. 90 capsule 1  . venlafaxine XR (EFFEXOR-XR) 75 MG 24 hr capsule TAKE ONE CAPSULE BY MOUTH DAILY WITH BREAKFAST 90 capsule 1   No facility-administered medications prior to visit.     No Known Allergies  Review of Systems  Constitutional: Negative for fever and malaise/fatigue.  HENT: Positive for congestion.   Eyes: Positive for redness. Negative for blurred vision.  Respiratory: Positive for cough. Negative for shortness of breath.   Cardiovascular: Negative for chest pain, palpitations and leg swelling.  Gastrointestinal: Negative for abdominal pain, blood in stool and nausea.  Genitourinary: Negative for dysuria and frequency.  Musculoskeletal: Negative for falls.  Skin: Negative for rash.  Neurological: Negative for dizziness, loss of consciousness and headaches.  Endo/Heme/Allergies: Negative for environmental allergies.  Psychiatric/Behavioral: Negative for depression. The patient is not nervous/anxious.        Objective:    Physical Exam  Constitutional: She is oriented to person, place, and time. She appears well-developed and well-nourished. No distress.  HENT:  Head: Normocephalic and atraumatic.  Nose: Nose normal.  Eyes: Right eye exhibits no discharge. Left eye exhibits no discharge.  Neck: Normal range of  motion. Neck supple.  Cardiovascular: Normal rate and regular rhythm.   No murmur heard. Pulmonary/Chest: Effort normal and breath sounds normal.  Abdominal: Soft. Bowel sounds are normal. There is no tenderness.  Musculoskeletal: She exhibits no edema.  Neurological: She is alert and oriented to person, place, and time.  Skin: Skin is warm and dry.  Psychiatric: She has a normal mood and affect.  Nursing note and vitals reviewed.   BP 126/81 (BP Location: Right Arm, Patient Position: Sitting, Cuff Size: Normal)   Pulse 68   Temp 97.7 F (36.5 C) (Oral)   Wt 128 lb 9.6 oz (58.3 kg)   LMP 07/31/1990   SpO2 100%   BMI 22.42 kg/m  Wt Readings from Last 3 Encounters:  04/18/16 128 lb 9.6 oz (58.3 kg)  03/15/16 124 lb (56.2 kg)  02/17/16 126 lb (57.2 kg)     Lab Results  Component Value Date   WBC 4.2 03/15/2016   HGB 10.0 (L) 03/15/2016   HCT 30.2 (L) 03/15/2016   PLT 355 03/15/2016   GLUCOSE 106 02/04/2016   CHOL 164 12/17/2015   TRIG 134.0 12/17/2015   HDL 41.10 12/17/2015   LDLCALC 96 12/17/2015   ALT <9 02/04/2016   AST 17 02/04/2016   NA 140 02/04/2016   K 3.6 02/04/2016   CL 106 12/17/2015   CREATININE 0.7 02/04/2016   BUN 11.1 02/04/2016   CO2 26 02/04/2016   TSH 16.73 (H) 12/17/2015   HGBA1C 5.4 12/17/2015    Lab Results  Component Value Date   TSH 16.73 (H) 12/17/2015   Lab Results  Component Value Date   WBC 4.2 03/15/2016   HGB 10.0 (L) 03/15/2016   HCT 30.2 (L) 03/15/2016  MCV 130 (H) 03/15/2016   PLT 355 03/15/2016   Lab Results  Component Value Date   NA 140 02/04/2016   K 3.6 02/04/2016   CHLORIDE 104 02/04/2016   CO2 26 02/04/2016   GLUCOSE 106 02/04/2016   BUN 11.1 02/04/2016   CREATININE 0.7 02/04/2016   BILITOT <0.30 02/04/2016   ALKPHOS 98 02/04/2016   AST 17 02/04/2016   ALT <9 02/04/2016   PROT 7.3 02/04/2016   ALBUMIN 3.1 (L) 02/04/2016   CALCIUM 8.9 02/04/2016   ANIONGAP 10 02/04/2016   EGFR >90 02/04/2016   GFR  92.62 12/17/2015   Lab Results  Component Value Date   CHOL 164 12/17/2015   Lab Results  Component Value Date   HDL 41.10 12/17/2015   Lab Results  Component Value Date   LDLCALC 96 12/17/2015   Lab Results  Component Value Date   TRIG 134.0 12/17/2015   Lab Results  Component Value Date   CHOLHDL 4 12/17/2015   Lab Results  Component Value Date   HGBA1C 5.4 12/17/2015       Assessment & Plan:   Problem List Items Addressed This Visit    Hypothyroid    On Levothyroxine, continue to monitor      Relevant Medications   levothyroxine (SYNTHROID, LEVOTHROID) 88 MCG tablet   Esophageal reflux    Avoid offending foods, take probiotics. Do not eat large meals in late evening and consider raising head of bed.       Relevant Medications   ranitidine (ZANTAC) 300 MG tablet   pantoprazole (PROTONIX) 40 MG tablet   Essential thrombocythemia ([facility])   Relevant Medications   ALPRAZolam (XANAX) 1 MG tablet   Hyperglycemia    hgba1c acceptable, minimize simple carbs. Increase exercise as tolerated.       Relevant Medications   furosemide (LASIX) 20 MG tablet   Vitamin D deficiency   Relevant Medications   furosemide (LASIX) 20 MG tablet   Dysuria   Relevant Medications   furosemide (LASIX) 20 MG tablet   Essential hypertension    Well controlled, no changes to meds. Encouraged heart healthy diet such as the DASH diet and exercise as tolerated.       Relevant Medications   furosemide (LASIX) 20 MG tablet   Cough - Primary   Relevant Orders   DG Chest 2 View (Completed)    Other Visit Diagnoses    Need for 23-polyvalent pneumococcal polysaccharide vaccine       Relevant Orders   Pneumococcal polysaccharide vaccine 23-valent greater than or equal to 2yo subcutaneous/IM (Completed)      I have discontinued Ms. Antonellis's hydroxyurea, loratadine, venlafaxine XR, omeprazole, HYDROcodone-acetaminophen, meclizine, and venlafaxine XR. I have also changed her  citalopram, levothyroxine, and montelukast. Additionally, I am having her start on ranitidine, pantoprazole, and benzonatate. Lastly, I am having her maintain her Multiple Vitamin (MULTIVITAMIN PO), fluorometholone, aspirin EC, Fish Oil, Probiotic Product (PROBIOTIC DAILY PO), folic acid, Calcium Carbonate-Vitamin D (CALTRATE COLON HEALTH PO), Cranberry, RESTASIS, ondansetron, ranitidine, promethazine, Vitamin D (Ergocalciferol), albuterol, ALPRAZolam, cyclobenzaprine, estradiol, fluticasone, fluticasone, furosemide, naproxen, and pramipexole.  Meds ordered this encounter  Medications  . DISCONTD: HYDROcodone-acetaminophen (NORCO) 5-325 MG tablet    Sig: Take 1 tablet by mouth every 6 (six) hours as needed for moderate pain. Take one tablet tab po tid.    Dispense:  120 tablet    Refill:  0  . albuterol (VENTOLIN HFA) 108 (90 Base) MCG/ACT inhaler    Sig: INHALE  2 PUFFS INTO THE LUNGS EVERY 6 (SIX) HOURS AS  NEEDED FOR WHEEZI NG OR SHORTNESS OF BREATH.    Dispense:  18 g    Refill:  8  . ALPRAZolam (XANAX) 1 MG tablet    Sig: TAKE 1 TABLET THREE TIMES A DAY AS NEEDED FOR ANXIETY    Dispense:  90 tablet    Refill:  01    Not to exceed 5 additional fills before 04/29/2016  . citalopram (CELEXA) 40 MG tablet    Sig: Take 1 tablet (40 mg total) by mouth every morning.    Dispense:  30 tablet    Refill:  3  . cyclobenzaprine (FLEXERIL) 10 MG tablet    Sig: Take 1 tablet (10 mg total) by mouth 2 (two) times daily as needed for muscle spasms.    Dispense:  60 tablet    Refill:  3  . estradiol (ESTRACE) 0.5 MG tablet    Sig: Take 1 tablet (0.5 mg total) by mouth 2 (two) times daily.    Dispense:  180 tablet    Refill:  1  . fluticasone (FLONASE) 50 MCG/ACT nasal spray    Sig: Place 2 sprays into both nostrils daily.    Dispense:  16 g    Refill:  6  . fluticasone (FLOVENT HFA) 110 MCG/ACT inhaler    Sig: Inhale 2 puffs into the lungs 2 (two) times daily.    Dispense:  1 Inhaler    Refill:   12  . furosemide (LASIX) 20 MG tablet    Sig: Take 1 tablet (20 mg total) by mouth 2 (two) times daily as needed for fluid or edema.    Dispense:  90 tablet    Refill:  1  . DISCONTD: hydroxyurea (HYDREA) 500 MG capsule    Sig: Take 2 capsules (1,000 mg total) by mouth daily. 90 day supply.    Dispense:  90 capsule    Refill:  3  . levothyroxine (SYNTHROID, LEVOTHROID) 88 MCG tablet    Sig: Take 1 tablet (88 mcg total) by mouth daily.    Dispense:  30 tablet    Refill:  5  . DISCONTD: meclizine (ANTIVERT) 12.5 MG tablet    Sig: TAKE 1 TABLET THREE TIMES A DAY AS NEEDED FOR NAUSEA OR DIZZINESS    Dispense:  10 tablet    Refill:  0    WANTS MORE THAN 10 TABLETS  . montelukast (SINGULAIR) 10 MG tablet    Sig: Take 1 tablet (10 mg total) by mouth at bedtime.    Dispense:  30 tablet    Refill:  5  . naproxen (NAPROSYN) 500 MG tablet    Sig: TAKE 1 TABLET (500 MG TOTAL) BY MOUTH 2 (TWO) TIMES DAILY WITH A MEAL.    Dispense:  180 tablet    Refill:  1  . pramipexole (MIRAPEX) 0.25 MG tablet    Sig: TAKE ONE OR TWO TABLETS BY MOUTH AT BEDTIME AS NEEDED    Dispense:  60 tablet    Refill:  1  . DISCONTD: venlafaxine XR (EFFEXOR-XR) 150 MG 24 hr capsule    Sig: Take 1 capsule (150 mg total) by mouth daily with breakfast.    Dispense:  90 capsule    Refill:  1  . DISCONTD: venlafaxine XR (EFFEXOR-XR) 75 MG 24 hr capsule    Sig: TAKE ONE CAPSULE BY MOUTH DAILY WITH BREAKFAST    Dispense:  90 capsule    Refill:  1  .  ranitidine (ZANTAC) 300 MG tablet    Sig: Take 1 tablet (300 mg total) by mouth at bedtime.    Dispense:  30 tablet    Refill:  5  . DISCONTD: cetirizine (ZYRTEC) 10 MG tablet    Sig: Take 1 tablet (10 mg total) by mouth daily.    Dispense:  30 tablet    Refill:  11  . pantoprazole (PROTONIX) 40 MG tablet    Sig: Take 1 tablet (40 mg total) by mouth daily.    Dispense:  30 tablet    Refill:  5  . benzonatate (TESSALON) 200 MG capsule    Sig: Take 1 capsule (200 mg  total) by mouth at bedtime as needed for cough.    Dispense:  90 capsule    Refill:  1     BLYTH, STACEY, MD

## 2016-04-29 NOTE — Assessment & Plan Note (Addendum)
Avoid offending foods, take probiotics. Do not eat large meals in late evening and consider raising head of bed.  

## 2016-04-29 NOTE — Assessment & Plan Note (Signed)
Well controlled, no changes to meds. Encouraged heart healthy diet such as the DASH diet and exercise as tolerated.  °

## 2016-05-01 ENCOUNTER — Telehealth: Payer: Self-pay | Admitting: Family Medicine

## 2016-05-01 ENCOUNTER — Other Ambulatory Visit: Payer: Self-pay | Admitting: Family Medicine

## 2016-05-01 ENCOUNTER — Other Ambulatory Visit: Payer: Self-pay | Admitting: Hematology & Oncology

## 2016-05-01 DIAGNOSIS — J189 Pneumonia, unspecified organism: Secondary | ICD-10-CM

## 2016-05-01 MED ORDER — AMOXICILLIN-POT CLAVULANATE 875-125 MG PO TABS: 1.0000 | ORAL_TABLET | Freq: Two times a day (BID) | ORAL | 0 refills | Status: DC

## 2016-05-01 MED ORDER — FLUCONAZOLE 150 MG PO TABS: ORAL_TABLET | ORAL | 0 refills | Status: DC

## 2016-05-01 NOTE — Telephone Encounter (Signed)
Patient informed and will do xray at her November appt. With PCP She has finished her antibiotic and still coughing/chest pain.  Would like more antibiotic, ALSO something for yeast

## 2016-05-01 NOTE — Telephone Encounter (Signed)
Chest xray ordered expected date the first of December.

## 2016-05-01 NOTE — Telephone Encounter (Signed)
They do not mention in lymph nodes, nodules or spots but just the development of a pneumonia b/l.

## 2016-05-01 NOTE — Telephone Encounter (Signed)
Patient would like to know if spot on her lung has gotten any larger??

## 2016-05-01 NOTE — Telephone Encounter (Signed)
-----   Message from Mosie Lukes, MD sent at 04/29/2016  9:36 AM EDT ----- Would like to repeat CXR in 6-8 weeks to assess resolution of likely pneumonia. Please order and let her know.

## 2016-05-01 NOTE — Telephone Encounter (Signed)
Give Augmentin 875 1 tab po bid x 10 day. Diflucan 150 mg tabs, 1 tab po weekly x 2 weeks

## 2016-05-01 NOTE — Telephone Encounter (Signed)
Sent in both prescriptions. And patient informed.

## 2016-05-02 ENCOUNTER — Telehealth: Payer: Self-pay | Admitting: Family Medicine

## 2016-05-02 NOTE — Telephone Encounter (Signed)
Patient informed. 

## 2016-05-02 NOTE — Telephone Encounter (Signed)
Both are low doses and the interaction is not significant. Best approach to minimize risk is to hold the Alprazolam the day she takes the Diflucan

## 2016-05-02 NOTE — Telephone Encounter (Signed)
Pharmacist informed of PCP instructions. 

## 2016-05-02 NOTE — Telephone Encounter (Signed)
Pharmacy called to let us know that there is a drug interaction between  ALPRAZolam Duanne Moron) 1 MG tablet and Diflucan. Please advise.    Pharmacy Contact: [phone]

## 2016-05-04 ENCOUNTER — Telehealth: Payer: Self-pay | Admitting: Family Medicine

## 2016-05-04 MED ORDER — BUDESONIDE-FORMOTEROL FUMARATE 160-4.5 MCG/ACT IN AERO: 2.0000 | INHALATION_SPRAY | Freq: Two times a day (BID) | RESPIRATORY_TRACT | 1 refills | Status: DC

## 2016-05-04 NOTE — Telephone Encounter (Signed)
Patient was prescribed fluticasone (FLOVENT HFA) 110 MCG/ACT inhaler And would like to be on Symbicort instead. She thinks that the Symbicort will actually open her up and help her to breathe better. Please advise.   Patient Relation: Self Patient [id number]

## 2016-05-04 NOTE — Telephone Encounter (Signed)
Do not believe insurance will cover bu can try Symbicort 160 1 puff po bid disp #1 with 3 rf, if they do not she will have to go back to pulmonology for this med

## 2016-05-04 NOTE — Telephone Encounter (Signed)
Sent in to pharmacy and patient informed

## 2016-05-12 ENCOUNTER — Other Ambulatory Visit: Payer: Self-pay | Admitting: Family Medicine

## 2016-05-12 DIAGNOSIS — R739 Hyperglycemia, unspecified: Secondary | ICD-10-CM

## 2016-05-12 DIAGNOSIS — R300 Dysuria: Secondary | ICD-10-CM

## 2016-05-12 DIAGNOSIS — I10 Essential (primary) hypertension: Secondary | ICD-10-CM

## 2016-05-12 DIAGNOSIS — E559 Vitamin D deficiency, unspecified: Secondary | ICD-10-CM

## 2016-05-15 ENCOUNTER — Ambulatory Visit (HOSPITAL_BASED_OUTPATIENT_CLINIC_OR_DEPARTMENT_OTHER): Payer: BLUE CROSS/BLUE SHIELD | Admitting: Hematology & Oncology

## 2016-05-15 ENCOUNTER — Other Ambulatory Visit (HOSPITAL_BASED_OUTPATIENT_CLINIC_OR_DEPARTMENT_OTHER): Payer: BLUE CROSS/BLUE SHIELD

## 2016-05-15 ENCOUNTER — Encounter: Payer: Self-pay | Admitting: Hematology & Oncology

## 2016-05-15 VITALS — BP 106/61 | HR 93 | Temp 97.4°F | Resp 18 | Ht 63.5 in | Wt 129.4 lb

## 2016-05-15 DIAGNOSIS — D473 Essential (hemorrhagic) thrombocythemia: Secondary | ICD-10-CM

## 2016-05-15 DIAGNOSIS — D508 Other iron deficiency anemias: Secondary | ICD-10-CM

## 2016-05-15 LAB — CMP (CANCER CENTER ONLY)
ALT(SGPT): 17 U/L (ref 10–47)
AST: 27 U/L (ref 11–38)
Albumin: 4.1 g/dL (ref 3.3–5.5)
Alkaline Phosphatase: 60 U/L (ref 26–84)
BUN, Bld: 18 mg/dL (ref 7–22)
CO2: 29 mEq/L (ref 18–33)
Calcium: 9.2 mg/dL (ref 8.0–10.3)
Chloride: 98 mEq/L (ref 98–108)
Creat: 1.1 mg/dl (ref 0.6–1.2)
Glucose, Bld: 73 mg/dL (ref 73–118)
Potassium: 3.6 mEq/L (ref 3.3–4.7)
Sodium: 135 mEq/L (ref 128–145)
Total Bilirubin: 0.4 mg/dl (ref 0.20–1.60)
Total Protein: 7.6 g/dL (ref 6.4–8.1)

## 2016-05-15 LAB — CBC WITH DIFFERENTIAL (CANCER CENTER ONLY)
BASO#: 0 10*3/uL (ref 0.0–0.2)
BASO%: 0.4 % (ref 0.0–2.0)
EOS%: 1.6 % (ref 0.0–7.0)
Eosinophils Absolute: 0.1 10*3/uL (ref 0.0–0.5)
HCT: 32.9 % — ABNORMAL LOW (ref 34.8–46.6)
HGB: 11.1 g/dL — ABNORMAL LOW (ref 11.6–15.9)
LYMPH#: 1.1 10*3/uL (ref 0.9–3.3)
LYMPH%: 22.4 % (ref 14.0–48.0)
MCH: 43 pg — ABNORMAL HIGH (ref 26.0–34.0)
MCHC: 33.7 g/dL (ref 32.0–36.0)
MCV: 128 fL — ABNORMAL HIGH (ref 81–101)
MONO#: 0.3 10*3/uL (ref 0.1–0.9)
MONO%: 5.1 % (ref 0.0–13.0)
NEUT#: 3.6 10*3/uL (ref 1.5–6.5)
NEUT%: 70.5 % (ref 39.6–80.0)
Platelets: 346 10*3/uL (ref 145–400)
RBC: 2.58 10*6/uL — ABNORMAL LOW (ref 3.70–5.32)
RDW: 11.8 % (ref 11.1–15.7)
WBC: 5.1 10*3/uL (ref 3.9–10.0)

## 2016-05-15 LAB — LACTATE DEHYDROGENASE: LDH: 203 U/L (ref 125–245)

## 2016-05-15 NOTE — Progress Notes (Signed)
Hematology and Oncology Follow Up Visit  Rachael Johnson [id number] 1951-05-19 65 y.o. 05/15/2016   Principle Diagnosis:  Essential thrombocythemia - JAK2 negative. 2. Intermittent iron-deficiency anemia.  Current Therapy:   Hydrea  1000 mg by mouth daily      Interim History:  [name] is back for followup.  She does not feel well. She apparently has developed pneumonia. She is being followed by Dr. Randel Pigg. Dr. Randel Pigg as well as antibiotics. Lives that she initially was started on Levaquin. She now is on Augmentin. I think she is on some inhalers. Rachael Johnson feels tired. She is not coughing. She has had no fever.  Her appetite is down a little bit. She's had no nausea or vomiting. She's had no diarrhea.  There's been no rashes. She's had no bleeding or bruising.   Overall, her performance status is ECOG 2.   Medications:  Current Outpatient Prescriptions:  .  ALPRAZolam (XANAX) 1 MG tablet, TAKE 1 TABLET THREE TIMES A DAY AS NEEDED FOR ANXIETY, Disp: 90 tablet, Rfl: 01 .  amoxicillin-clavulanate (AUGMENTIN) 875-125 MG tablet, Take 1 tablet by mouth 2 (two) times daily. Take for 10 days, Disp: 20 tablet, Rfl: 0 .  aspirin EC 81 MG tablet, Take 81 mg by mouth every morning., Disp: , Rfl:  .  benzonatate (TESSALON) 200 MG capsule, Take 1 capsule (200 mg total) by mouth at bedtime as needed for cough., Disp: 90 capsule, Rfl: 1 .  budesonide-formoterol (SYMBICORT) 160-4.5 MCG/ACT inhaler, Inhale 2 puffs into the lungs 2 (two) times daily., Disp: 1 Inhaler, Rfl: 1 .  Calcium Carbonate-Vitamin D (CALTRATE COLON HEALTH PO), Take by mouth., Disp: , Rfl:  .  cetirizine (ZYRTEC) 10 MG tablet, Take 1 tablet (10 mg total) by mouth daily., Disp: 90 tablet, Rfl: 1 .  citalopram (CELEXA) 40 MG tablet, Take 1 tablet (40 mg total) by mouth every morning., Disp: 30 tablet, Rfl: 3 .  Cranberry 300 MG tablet, Take 300 mg by mouth 2 (two) times daily., Disp: , Rfl:  .  cyclobenzaprine (FLEXERIL) 10 MG  tablet, Take 1 tablet (10 mg total) by mouth 2 (two) times daily as needed for muscle spasms., Disp: 60 tablet, Rfl: 3 .  estradiol (ESTRACE) 0.5 MG tablet, Take 1 tablet (0.5 mg total) by mouth 2 (two) times daily., Disp: 180 tablet, Rfl: 1 .  fluconazole (DIFLUCAN) 150 MG tablet, Take one tablet once a week for 2 weeks., Disp: 2 tablet, Rfl: 0 .  fluorometholone (FML) 0.1 % ophthalmic suspension, Place 1 drop into both eyes daily as needed (For dry eyes.). , Disp: , Rfl:  .  fluticasone (FLONASE) 50 MCG/ACT nasal spray, Place 2 sprays into both nostrils daily., Disp: 16 g, Rfl: 6 .  fluticasone (FLOVENT HFA) 110 MCG/ACT inhaler, Inhale 2 puffs into the lungs 2 (two) times daily., Disp: 1 Inhaler, Rfl: 12 .  folic acid (FOLVITE) 1 MG tablet, Take 1 mg by mouth daily., Disp: , Rfl:  .  furosemide (LASIX) 20 MG tablet, Take 1 tablet (20 mg total) by mouth 2 (two) times daily as needed for fluid or edema., Disp: 90 tablet, Rfl: 1 .  HYDROcodone-acetaminophen (NORCO) 5-325 MG tablet, Take 1 tablet by mouth every 6 (six) hours as needed for severe pain. Take one tablet by mouth every 6 hours as needed for severe pain, Disp: 120 tablet, Rfl: 0 .  hydroxyurea (HYDREA) 500 MG capsule, Take 2 capsules (1,000 mg total) by mouth daily. 90 day supply.,  Disp: 90 capsule, Rfl: 3 .  lactulose (CHRONULAC) 10 GM/15ML solution, TAKE 2 TABLESPOONFULS (30 ML) BY MOUTH 3 TIMES A DAY FOR CONSTIPATION, Disp: 1000 mL, Rfl: 8 .  levofloxacin (LEVAQUIN) 500 MG tablet, Take 1 tablet (500 mg total) by mouth daily., Disp: 7 tablet, Rfl: 0 .  levothyroxine (SYNTHROID, LEVOTHROID) 88 MCG tablet, Take 1 tablet (88 mcg total) by mouth daily., Disp: 30 tablet, Rfl: 5 .  meclizine (ANTIVERT) 12.5 MG tablet, TAKE 1 TABLET THREE TIMES A DAY AS NEEDED FOR NAUSEA OR DIZZINESS, Disp: 10 tablet, Rfl: 0 .  montelukast (SINGULAIR) 10 MG tablet, Take 1 tablet (10 mg total) by mouth at bedtime., Disp: 30 tablet, Rfl: 5 .  Multiple Vitamin  (MULTIVITAMIN PO), Take 1 tablet by mouth every morning. , Disp: , Rfl:  .  naproxen (NAPROSYN) 500 MG tablet, TAKE 1 TABLET (500 MG TOTAL) BY MOUTH 2 (TWO) TIMES DAILY WITH A MEAL., Disp: 180 tablet, Rfl: 1 .  Omega-3 Fatty Acids (FISH OIL) 1000 MG CAPS, Take by mouth 2 (two) times daily., Disp: , Rfl:  .  ondansetron (ZOFRAN) 8 MG tablet, TAKE ONE TABLET BY MOUTH EVERY 8 HOURS AS NEEDED NAUSEA AND VOMITING, Disp: 20 tablet, Rfl: 0 .  pantoprazole (PROTONIX) 40 MG tablet, Take 1 tablet (40 mg total) by mouth daily., Disp: 30 tablet, Rfl: 5 .  pramipexole (MIRAPEX) 0.25 MG tablet, TAKE ONE OR TWO TABLETS BY MOUTH AT BEDTIME AS NEEDED, Disp: 60 tablet, Rfl: 1 .  Probiotic Product (PROBIOTIC DAILY PO), Take by mouth 2 (two) times daily., Disp: , Rfl:  .  promethazine (PHENERGAN) 25 MG tablet, Take 1 tablet (25 mg total) by mouth every 6 (six) hours as needed for nausea or vomiting., Disp: 60 tablet, Rfl: 4 .  ranitidine (ZANTAC) 300 MG tablet, TAKE 1 TABLET BY MOUTH AT BEDTIME AS NEEDED FOR HEARTBURN, Disp: 30 tablet, Rfl: 5 .  ranitidine (ZANTAC) 300 MG tablet, Take 1 tablet (300 mg total) by mouth at bedtime., Disp: 30 tablet, Rfl: 5 .  RESTASIS 0.05 % ophthalmic emulsion, , Disp: , Rfl: 3 .  venlafaxine XR (EFFEXOR-XR) 150 MG 24 hr capsule, Take 1 capsule (150 mg total) by mouth daily with breakfast., Disp: 90 capsule, Rfl: 1 .  venlafaxine XR (EFFEXOR-XR) 75 MG 24 hr capsule, TAKE ONE CAPSULE BY MOUTH DAILY WITH BREAKFAST, Disp: 90 capsule, Rfl: 1 .  Vitamin D, Ergocalciferol, (DRISDOL) 50000 units CAPS capsule, TAKE 1 CAPSULE BY MOUTH EVERY OTHER WEEK, Disp: 6 capsule, Rfl: 0  Allergies: No Known Allergies  Past Medical History, Surgical history, Social history, and Family History were reviewed and updated.  Review of Systems: As above  Physical Exam:  height is 5' 3.5" (1.613 m) and weight is 129 lb 6.4 oz (58.7 kg). Her oral temperature is 97.4 F (36.3 C). Her blood pressure is 106/61  and her pulse is 93. Her respiration is 18.   Thin white female. Head and neck exam shows no ocular or oral lesions. She has some slight temporal muscle wasting. There is no adenopathy in her neck. Lungs are clear on the right side. There might be some slight decrease on the left side. No wheezes or crackles are noted bilaterally. Cardiac exam regular rate and rhythm. No murmurs. Abdomen is soft. She has good bowel sounds. There is no fluid wave. There is no palpable liver or spleen. Back exam shows no tenderness over the spine ribs or hips. Extremities shows mild chronic nonpitting edema. she  has good range of motion of her joints. She has a compression sleeve on her right arm. There is no swelling of the right arm. There is no cyanosis of her lower extremities. She has good pulses. . Skin exam no rashes. Neurological exam no focal deficits.  Lab Results  Component Value Date   WBC 5.1 05/15/2016   HGB 11.1 (L) 05/15/2016   HCT 32.9 (L) 05/15/2016   MCV 128 (H) 05/15/2016   PLT 346 05/15/2016     Chemistry      Component Value Date/Time   NA 135 05/15/2016 0930   NA 140 02/04/2016 0929   K 3.6 05/15/2016 0930   K 3.6 02/04/2016 0929   CL 98 05/15/2016 0930   CO2 29 05/15/2016 0930   CO2 26 02/04/2016 0929   BUN 18 05/15/2016 0930   BUN 11.1 02/04/2016 0929   CREATININE 1.1 05/15/2016 0930   CREATININE 0.7 02/04/2016 0929      Component Value Date/Time   CALCIUM 9.2 05/15/2016 0930   CALCIUM 8.9 02/04/2016 0929   ALKPHOS 60 05/15/2016 0930   ALKPHOS 98 02/04/2016 0929   AST 27 05/15/2016 0930   AST 17 02/04/2016 0929   ALT 17 05/15/2016 0930   ALT <9 02/04/2016 0929   BILITOT 0.40 05/15/2016 0930   BILITOT <0.30 02/04/2016 0929         Impression and Plan: Rachael Johnson is 65 year old white female with essential thrombocythemia.  For now, the pneumonia is her big problem. This is being managed by Dr. Randel Pigg. Her white cell count is okay. I don't see the suspicious under  the microscope.  Hopefully, her pneumonia will improve.  I'm glad to see that her hemoglobin is improving.  I will logic get her back in 2 more months. I think this would be very reasonable. back in another couple months.    [name], MD 10/16/201710:18 AM

## 2016-05-16 LAB — RETICULOCYTES: Reticulocyte Count: 1.1 % (ref 0.6–2.6)

## 2016-05-16 LAB — IRON AND TIBC
%SAT: 43 % (ref 21–57)
Iron: 94 ug/dL (ref 41–142)
TIBC: 215 ug/dL — ABNORMAL LOW (ref 236–444)
UIBC: 122 ug/dL (ref 120–384)

## 2016-05-16 LAB — FERRITIN: Ferritin: 549 ng/ml — ABNORMAL HIGH (ref 9–269)

## 2016-05-24 ENCOUNTER — Telehealth: Payer: Self-pay | Admitting: Family Medicine

## 2016-05-24 ENCOUNTER — Other Ambulatory Visit: Payer: Self-pay | Admitting: Family Medicine

## 2016-05-24 NOTE — Telephone Encounter (Signed)
We could try low dose Elavil but because of her other meds would not recommend Ambien. Start Elavil 10 mg tab, 1 tab po qhs prn insomnia, can titrate later as needed. Disp #30 with 1 rf and make sure she has a follow up appt to discuss response

## 2016-05-24 NOTE — Telephone Encounter (Signed)
Relation to [id number] Call back number:[phone] Pharmacy: [id number] - [place], [place] [phone] (Phone) [phone] (Fax)     Reason for call:  Patient states she cant sleep and PCP is aware, requesting Rx. Informed patient PCP is out of the office and will follow up the next business day patient voice understanding.

## 2016-05-24 NOTE — Telephone Encounter (Signed)
Please advise 

## 2016-05-25 MED ORDER — AMITRIPTYLINE HCL 10 MG PO TABS: 10.0000 mg | ORAL_TABLET | Freq: Every day | ORAL | 1 refills | Status: DC

## 2016-05-25 NOTE — Telephone Encounter (Signed)
Sent in elavil as instructed Patient instructed of PCP instructions.

## 2016-06-06 ENCOUNTER — Other Ambulatory Visit: Payer: Self-pay | Admitting: Family Medicine

## 2016-06-06 DIAGNOSIS — R300 Dysuria: Secondary | ICD-10-CM

## 2016-06-06 DIAGNOSIS — E559 Vitamin D deficiency, unspecified: Secondary | ICD-10-CM

## 2016-06-06 DIAGNOSIS — I10 Essential (primary) hypertension: Secondary | ICD-10-CM

## 2016-06-06 DIAGNOSIS — R739 Hyperglycemia, unspecified: Secondary | ICD-10-CM

## 2016-06-19 ENCOUNTER — Ambulatory Visit (INDEPENDENT_AMBULATORY_CARE_PROVIDER_SITE_OTHER): Payer: BLUE CROSS/BLUE SHIELD | Admitting: Family Medicine

## 2016-06-19 ENCOUNTER — Telehealth: Payer: Self-pay | Admitting: Family Medicine

## 2016-06-19 ENCOUNTER — Encounter: Payer: Self-pay | Admitting: Family Medicine

## 2016-06-19 VITALS — BP 108/72 | HR 97 | Temp 98.1°F | Ht 64.0 in | Wt 127.2 lb

## 2016-06-19 DIAGNOSIS — A048 Other specified bacterial intestinal infections: Secondary | ICD-10-CM | POA: Diagnosis not present

## 2016-06-19 DIAGNOSIS — I10 Essential (primary) hypertension: Secondary | ICD-10-CM | POA: Diagnosis not present

## 2016-06-19 DIAGNOSIS — K219 Gastro-esophageal reflux disease without esophagitis: Secondary | ICD-10-CM

## 2016-06-19 DIAGNOSIS — R300 Dysuria: Secondary | ICD-10-CM

## 2016-06-19 DIAGNOSIS — E039 Hypothyroidism, unspecified: Secondary | ICD-10-CM

## 2016-06-19 DIAGNOSIS — E559 Vitamin D deficiency, unspecified: Secondary | ICD-10-CM | POA: Diagnosis not present

## 2016-06-19 DIAGNOSIS — R739 Hyperglycemia, unspecified: Secondary | ICD-10-CM | POA: Diagnosis not present

## 2016-06-19 DIAGNOSIS — J189 Pneumonia, unspecified organism: Secondary | ICD-10-CM

## 2016-06-19 DIAGNOSIS — E785 Hyperlipidemia, unspecified: Secondary | ICD-10-CM

## 2016-06-19 LAB — COMPREHENSIVE METABOLIC PANEL
ALT: 8 U/L (ref 0–35)
AST: 16 U/L (ref 0–37)
Albumin: 4.2 g/dL (ref 3.5–5.2)
Alkaline Phosphatase: 61 U/L (ref 39–117)
BUN: 13 mg/dL (ref 6–23)
CO2: 28 mEq/L (ref 19–32)
Calcium: 9.2 mg/dL (ref 8.4–10.5)
Chloride: 103 mEq/L (ref 96–112)
Creatinine, Ser: 0.7 mg/dL (ref 0.40–1.20)
GFR: 89.43 mL/min (ref >60.00)
Glucose, Bld: 86 mg/dL (ref 70–99)
Potassium: 3.8 mEq/L (ref 3.5–5.1)
Sodium: 138 mEq/L (ref 135–145)
Total Bilirubin: 0.3 mg/dL (ref 0.2–1.2)
Total Protein: 7.5 g/dL (ref 6.0–8.3)

## 2016-06-19 LAB — LIPID PANEL
Cholesterol: 176 mg/dL (ref 0–200)
HDL: 69.8 mg/dL (ref >39.00)
LDL Cholesterol: 92 mg/dL (ref 0–99)
NonHDL: 105.92
Total CHOL/HDL Ratio: 3
Triglycerides: 70 mg/dL (ref 0.0–149.0)
VLDL: 14 mg/dL (ref 0.0–40.0)

## 2016-06-19 LAB — VITAMIN D 25 HYDROXY (VIT D DEFICIENCY, FRACTURES): VITD: 86.59 ng/mL (ref 30.00–100.00)

## 2016-06-19 LAB — HEMOGLOBIN A1C: Hgb A1c MFr Bld: 5.4 % (ref 4.6–6.5)

## 2016-06-19 LAB — TSH: TSH: 2.05 u[IU]/mL (ref 0.35–4.50)

## 2016-06-19 MED ORDER — LEVOFLOXACIN 500 MG PO TABS: 500.0000 mg | ORAL_TABLET | Freq: Every day | ORAL | 0 refills | Status: DC

## 2016-06-19 MED ORDER — HYDROCODONE-ACETAMINOPHEN 5-325 MG PO TABS: 1.0000 | ORAL_TABLET | Freq: Four times a day (QID) | ORAL | 0 refills | Status: DC | PRN

## 2016-06-19 MED ORDER — CIPROFLOXACIN HCL 500 MG PO TABS: 500.0000 mg | ORAL_TABLET | Freq: Two times a day (BID) | ORAL | 0 refills | Status: DC

## 2016-06-19 NOTE — Telephone Encounter (Signed)
Caller name: Relationship to patient: Self Can be reached: [phone] Pharmacy:  Reason for call: FYI:  Patient called and asked if someone from our office could go downstairs and look in the bathrooms to see if her Rx for HYDROcodone-acetaminophen (NORCO) 5-325 MG tablet [id number]  Was in there because she left it in the bathroom. Ebony went to look and it was not in the bathroom.

## 2016-06-19 NOTE — Telephone Encounter (Signed)
Please d/c Levaquin and try Ciprofloxacin 500 po bid x 7 days

## 2016-06-19 NOTE — Telephone Encounter (Addendum)
[facility] #[id number] - [place], [place] (Phone) [phone] (Fax)    As per pharmacy levofloxacin (LEVAQUIN) 500 MG tablet was prescribed and there's a drug interaction with venlafaxine XR (EFFEXOR-XR) 75 MG 24 hr capsule citalopram (CELEXA) 40 MG tablet. Please advise

## 2016-06-19 NOTE — Telephone Encounter (Signed)
Patient will pickup sometime next week when back in the area for another MD appt.

## 2016-06-19 NOTE — Telephone Encounter (Signed)
Called the patient informed paperwork was found and is at the front desk in an envelope at the front desk. Paperwork found was her AVS and hardcopy for hydrocodone

## 2016-06-19 NOTE — Progress Notes (Signed)
Pre visit review using our clinic review tool, if applicable. No additional management support is needed unless otherwise documented below in the visit note. 

## 2016-06-19 NOTE — Patient Instructions (Signed)

## 2016-06-19 NOTE — Telephone Encounter (Signed)
Medication changed and sent to pharmacy  

## 2016-06-20 MED ORDER — AMOXICILLIN-POT CLAVULANATE 875-125 MG PO TABS: 1.0000 | ORAL_TABLET | Freq: Two times a day (BID) | ORAL | 0 refills | Status: DC

## 2016-06-20 NOTE — Telephone Encounter (Signed)
D/c the levaquin and cipro and send in Augmentin XR 875 mg po bid x 10 days

## 2016-06-20 NOTE — Addendum Note (Signed)
Addended by: Sharon Seller B on: 06/20/2016 05:39 PM   Modules accepted: Orders

## 2016-06-20 NOTE — Telephone Encounter (Signed)
Pharmacy states that Cipro is in the same family of drugs and has the same interaction. Plse adv  [id number] - [place], [place] [phone] (Phone) [phone] (Fax)

## 2016-06-20 NOTE — Telephone Encounter (Signed)
Updated list and sent in augmentin. Called the patient to inform, no answer/mail box full/home line busy.

## 2016-06-26 ENCOUNTER — Telehealth: Payer: Self-pay | Admitting: Family Medicine

## 2016-06-26 NOTE — Telephone Encounter (Signed)
OK to increase the Elavil to 20 mg qhs

## 2016-06-26 NOTE — Telephone Encounter (Signed)
Relation to [id number] Call back number:[phone]   Reason for call:  Patient would like to increase amitriptyline (ELAVIL) 10 MG tablet to 20 MG due to patient stating she's not sleeping at night, please advise

## 2016-06-27 MED ORDER — AMITRIPTYLINE HCL 10 MG PO TABS: 20.0000 mg | ORAL_TABLET | Freq: Every day | ORAL | 0 refills | Status: DC

## 2016-06-27 NOTE — Telephone Encounter (Signed)
Updated medication list and sent in new prescription for take 20 mg qhs of elavil. Patient informed request is done.

## 2016-07-02 NOTE — Progress Notes (Signed)
Patient ID: Rachael Johnson, female   DOB: 06-08-1951, 65 y.o.   MRN: [id number]   Subjective:    Patient ID: Rachael Johnson, female    DOB: May 19, 1951, 65 y.o.   MRN: [id number]  Chief Complaint  Patient presents with  . Follow-up    HPI Patient is in today for follow up. She is slowly improving after treatment of pneumonia. No fevers or chills but does have some persistent fatigue and mild congesiton. No new acute concerns. Reflux continues to happen with some nausea at times. Denies CP/palp/SOB/HA or GU c/o. Taking meds as prescribed  Past Medical History:  Diagnosis Date  . Acute pharyngitis 07/13/2014  . Allergy   . Anemia, iron deficiency 07/08/2012  . Arthritis   . Cataract 12/21/2014  . Cerumen impaction 04/28/2013  . Chest pain   . Depression   . Depression with anxiety 03/24/2011  . Diverticulitis   . Esophageal reflux 08/17/2013  . Essential thrombocythemia ([place]) 01/24/2011  . Frequent episodic tension-type headache   . Gastroenteritis 12/21/2014  . 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 GERD (gastroesophageal reflux disease)   . H. pylori infection 12/19/2012  . Hyperglycemia 12/17/2015  . Hyperglycemia 12/17/2015  . Hypertension   . Normal cardiac stress test   . Other and unspecified hyperlipidemia 02/25/2013  . Pedal edema 03/07/2016  . Pneumonia 04/29/2016  . Preventative health care 04/20/2013  . Restless leg syndrome 09/30/2014  . Scabies 03/28/2015  . Seizure ([place])    childhood  . Thyroid disease     Past Surgical History:  Procedure Laterality Date  . ABDOMINAL HYSTERECTOMY     1992  . BREAST SURGERY  1992   biopsy, benign. Fibrocystic  . UMBILICAL HERNIA REPAIR N/A 09/25/2012   Procedure: HERNIA REPAIR UMBILICAL ADULT;  Surgeon: Harl Bowie, MD;  Location: [facility];  Service: General;  Laterality: N/A;    Family History  Problem Relation Age of Onset  . Arthritis Mother   . Cancer Mother     ovarian  . Hypertension Mother   . Heart disease Mother     pacer  .  Heart failure Mother   . Arthritis Father   . Cancer Father     lung  . Hypertension Father   . Cancer Sister     lung  . Cancer Brother     prostate  . Cancer Brother     lung  . Hypertension Son   . COPD Brother   . Heart disease Brother   . Hypertension Brother   . Cancer Brother     colon  . Alcohol abuse Brother   . Cirrhosis Brother   . Seizures Sister   . Stroke Sister   . Arthritis Brother     Social History   Social History  . Marital status: Widowed    Spouse name: N/A  . Number of children: 2  . Years of education: N/A   Occupational History  . APL operator [employer]   Social History Main Topics  . Smoking status: Former Smoker    Packs/day: 3.50    Years: 20.00    Types: Cigarettes    Start date: 11/15/1970    Quit date: 07/31/1990  . Smokeless tobacco: Never Used     Comment: quit 23 years ago  . Alcohol use No  . Drug use: No  . Sexual activity: No   Other Topics Concern  . Not on file   Social History Narrative  Regular exercise: no   Caffeine Use: 2-3 weekly    Outpatient Medications Prior to Visit  Medication Sig Dispense Refill  . ALPRAZolam (XANAX) 1 MG tablet TAKE 1 TABLET THREE TIMES A DAY AS NEEDED FOR ANXIETY 90 tablet 01  . aspirin EC 81 MG tablet Take 81 mg by mouth every morning.    . benzonatate (TESSALON) 200 MG capsule Take 1 capsule (200 mg total) by mouth at bedtime as needed for cough. 90 capsule 1  . budesonide-formoterol (SYMBICORT) 160-4.5 MCG/ACT inhaler Inhale 2 puffs into the lungs 2 (two) times daily. 1 Inhaler 1  . Calcium Carbonate-Vitamin D (CALTRATE COLON HEALTH PO) Take by mouth.    . cetirizine (ZYRTEC) 10 MG tablet Take 1 tablet (10 mg total) by mouth daily. 90 tablet 1  . citalopram (CELEXA) 40 MG tablet Take 1 tablet (40 mg total) by mouth every morning. 30 tablet 3  . Cranberry 300 MG tablet Take 300 mg by mouth 2 (two) times daily.    . cyclobenzaprine (FLEXERIL) 10 MG tablet Take 1 tablet  (10 mg total) by mouth 2 (two) times daily as needed for muscle spasms. 60 tablet 3  . estradiol (ESTRACE) 0.5 MG tablet Take 1 tablet (0.5 mg total) by mouth 2 (two) times daily. 180 tablet 1  . fluconazole (DIFLUCAN) 150 MG tablet Take one tablet once a week for 2 weeks. 2 tablet 0  . fluorometholone (FML) 0.1 % ophthalmic suspension Place 1 drop into both eyes daily as needed (For dry eyes.).     Marland Kitchen fluticasone (FLONASE) 50 MCG/ACT nasal spray Place 2 sprays into both nostrils daily. 16 g 6  . fluticasone (FLOVENT HFA) 110 MCG/ACT inhaler Inhale 2 puffs into the lungs 2 (two) times daily. 1 Inhaler 12  . folic acid (FOLVITE) 1 MG tablet Take 1 mg by mouth daily.    . furosemide (LASIX) 20 MG tablet Take 1 tablet (20 mg total) by mouth 2 (two) times daily as needed for fluid or edema. 90 tablet 1  . hydroxyurea (HYDREA) 500 MG capsule Take 2 capsules (1,000 mg total) by mouth daily. 90 day supply. 90 capsule 3  . lactulose (CHRONULAC) 10 GM/15ML solution TAKE 2 TABLESPOONFULS (30 ML) BY MOUTH 3 TIMES A DAY FOR CONSTIPATION 1000 mL 8  . levothyroxine (SYNTHROID, LEVOTHROID) 88 MCG tablet Take 1 tablet (88 mcg total) by mouth daily. 30 tablet 5  . meclizine (ANTIVERT) 12.5 MG tablet TAKE 1 TABLET THREE TIMES A DAY AS NEEDED FOR NAUSEA OR DIZZINESS 10 tablet 0  . montelukast (SINGULAIR) 10 MG tablet Take 1 tablet (10 mg total) by mouth at bedtime. 30 tablet 5  . Multiple Vitamin (MULTIVITAMIN PO) Take 1 tablet by mouth every morning.     . naproxen (NAPROSYN) 500 MG tablet TAKE 1 TABLET (500 MG TOTAL) BY MOUTH 2 (TWO) TIMES DAILY WITH A MEAL. 180 tablet 1  . Omega-3 Fatty Acids (FISH OIL) 1000 MG CAPS Take by mouth 2 (two) times daily.    . ondansetron (ZOFRAN) 8 MG tablet TAKE ONE TABLET BY MOUTH EVERY 8 HOURS AS NEEDED NAUSEA AND VOMITING 20 tablet 0  . pantoprazole (PROTONIX) 40 MG tablet Take 1 tablet (40 mg total) by mouth daily. 30 tablet 5  . pramipexole (MIRAPEX) 0.25 MG tablet TAKE ONE OR  TWO TABLETS BY MOUTH AT BEDTIME AS NEEDED 60 tablet 1  . Probiotic Product (PROBIOTIC DAILY PO) Take by mouth 2 (two) times daily.    . promethazine (PHENERGAN)  25 MG tablet Take 1 tablet (25 mg total) by mouth every 6 (six) hours as needed for nausea or vomiting. 60 tablet 4  . ranitidine (ZANTAC) 300 MG tablet Take 1 tablet (300 mg total) by mouth at bedtime. 30 tablet 5  . RESTASIS 0.05 % ophthalmic emulsion   3  . venlafaxine XR (EFFEXOR-XR) 150 MG 24 hr capsule Take 1 capsule (150 mg total) by mouth daily with breakfast. 90 capsule 1  . venlafaxine XR (EFFEXOR-XR) 75 MG 24 hr capsule TAKE ONE CAPSULE BY MOUTH DAILY WITH BREAKFAST 90 capsule 1  . Vitamin D, Ergocalciferol, (DRISDOL) 50000 units CAPS capsule TAKE 1 CAPSULE BY MOUTH EVERY OTHER WEEK 6 capsule 0  . amitriptyline (ELAVIL) 10 MG tablet Take 1 tablet (10 mg total) by mouth at bedtime. 30 tablet 1  . amoxicillin-clavulanate (AUGMENTIN) 875-125 MG tablet Take 1 tablet by mouth 2 (two) times daily. Take for 10 days 20 tablet 0  . HYDROcodone-acetaminophen (NORCO) 5-325 MG tablet Take 1 tablet by mouth every 6 (six) hours as needed for severe pain. Take one tablet by mouth every 6 hours as needed for severe pain 120 tablet 0  . levofloxacin (LEVAQUIN) 500 MG tablet Take 1 tablet (500 mg total) by mouth daily. 7 tablet 0  . ranitidine (ZANTAC) 300 MG tablet TAKE 1 TABLET BY MOUTH AT BEDTIME AS NEEDED FOR HEARTBURN 30 tablet 5   No facility-administered medications prior to visit.     No Known Allergies  Review of Systems  Constitutional: Positive for malaise/fatigue. Negative for fever.  HENT: Positive for congestion. Negative for ear discharge, sinus pain and sore throat.   Eyes: Negative for blurred vision.  Respiratory: Positive for cough. Negative for hemoptysis, shortness of breath and wheezing.   Cardiovascular: Negative for chest pain, palpitations and leg swelling.  Gastrointestinal: Negative for abdominal pain, blood in  stool and nausea.  Genitourinary: Negative for dysuria and frequency.  Musculoskeletal: Negative for falls.  Skin: Negative for rash.  Neurological: Negative for dizziness, loss of consciousness and headaches.  Endo/Heme/Allergies: Negative for environmental allergies.  Psychiatric/Behavioral: Negative for depression. The patient is not nervous/anxious.        Objective:    Physical Exam  Constitutional: She is oriented to person, place, and time. She appears well-developed and well-nourished. No distress.  HENT:  Head: Normocephalic and atraumatic.  Nose: Nose normal.  Eyes: Right eye exhibits no discharge. Left eye exhibits no discharge.  Neck: Normal range of motion. Neck supple.  Cardiovascular: Normal rate and regular rhythm.   No murmur heard. Pulmonary/Chest: Effort normal and breath sounds normal.  Abdominal: Soft. Bowel sounds are normal. There is no tenderness.  Musculoskeletal: She exhibits no edema.  Neurological: She is alert and oriented to person, place, and time.  Skin: Skin is warm and dry.  Psychiatric: She has a normal mood and affect.  Nursing note and vitals reviewed.   BP 108/72 (BP Location: Left Arm, Patient Position: Sitting, Cuff Size: Normal)   Pulse 97   Temp 98.1 F (36.7 C) (Oral)   Ht '5\' 4"'  (1.626 m)   Wt 127 lb 4 oz (57.7 kg)   LMP 07/31/1990   SpO2 96%   BMI 21.84 kg/m  Wt Readings from Last 3 Encounters:  06/19/16 127 lb 4 oz (57.7 kg)  05/15/16 129 lb 6.4 oz (58.7 kg)  04/18/16 128 lb 9.6 oz (58.3 kg)     Lab Results  Component Value Date   WBC 5.1 05/15/2016  HGB 11.1 (L) 05/15/2016   HCT 32.9 (L) 05/15/2016   PLT 346 05/15/2016   GLUCOSE 86 06/19/2016   CHOL 176 06/19/2016   TRIG 70.0 06/19/2016   HDL 69.80 06/19/2016   LDLCALC 92 06/19/2016   ALT 8 06/19/2016   AST 16 06/19/2016   NA 138 06/19/2016   K 3.8 06/19/2016   CL 103 06/19/2016   CREATININE 0.70 06/19/2016   BUN 13 06/19/2016   CO2 28 06/19/2016   TSH  2.05 06/19/2016   HGBA1C 5.4 06/19/2016    Lab Results  Component Value Date   TSH 2.05 06/19/2016   Lab Results  Component Value Date   WBC 5.1 05/15/2016   HGB 11.1 (L) 05/15/2016   HCT 32.9 (L) 05/15/2016   MCV 128 (H) 05/15/2016   PLT 346 05/15/2016   Lab Results  Component Value Date   NA 138 06/19/2016   K 3.8 06/19/2016   CHLORIDE 104 02/04/2016   CO2 28 06/19/2016   GLUCOSE 86 06/19/2016   BUN 13 06/19/2016   CREATININE 0.70 06/19/2016   BILITOT 0.3 06/19/2016   ALKPHOS 61 06/19/2016   AST 16 06/19/2016   ALT 8 06/19/2016   PROT 7.5 06/19/2016   ALBUMIN 4.2 06/19/2016   CALCIUM 9.2 06/19/2016   ANIONGAP 10 02/04/2016   EGFR >90 02/04/2016   GFR 89.43 06/19/2016   Lab Results  Component Value Date   CHOL 176 06/19/2016   Lab Results  Component Value Date   HDL 69.80 06/19/2016   Lab Results  Component Value Date   LDLCALC 92 06/19/2016   Lab Results  Component Value Date   TRIG 70.0 06/19/2016   Lab Results  Component Value Date   CHOLHDL 3 06/19/2016   Lab Results  Component Value Date   HGBA1C 5.4 06/19/2016       Assessment & Plan:   Problem List Items Addressed This Visit    Hypothyroid   Hyperlipidemia    Encouraged heart healthy diet, increase exercise, avoid trans fats, consider a krill oil cap daily      Esophageal reflux    Avoid offending foods, start probiotics. Do not eat large meals in late evening and consider raising head of bed. Continue ranitdine and protonix and may use prometazine prn      H. pylori infection - Primary   Hyperglycemia    hgba1c acceptable, minimize simple carbs. Increase exercise as tolerated.       Relevant Medications   HYDROcodone-acetaminophen (NORCO) 5-325 MG tablet   Other Relevant Orders   Hemoglobin A1c (Completed)   Vitamin D deficiency   Relevant Medications   HYDROcodone-acetaminophen (NORCO) 5-325 MG tablet   Other Relevant Orders   VITAMIN D 25 Hydroxy (Vit-D Deficiency,  Fractures) (Completed)   Dysuria   Relevant Medications   HYDROcodone-acetaminophen (NORCO) 5-325 MG tablet   Essential hypertension    Well controlled, no changes to meds. Encouraged heart healthy diet such as the DASH diet and exercise as tolerated.       Relevant Medications   HYDROcodone-acetaminophen (NORCO) 5-325 MG tablet   Other Relevant Orders   TSH (Completed)   Lipid panel (Completed)   Comprehensive metabolic panel (Completed)   CAP (community acquired pneumonia)    Improving after treatment         I have discontinued Ms. Mirabile's levofloxacin and amoxicillin-clavulanate. I am also having her maintain her Multiple Vitamin (MULTIVITAMIN PO), fluorometholone, aspirin EC, Fish Oil, Probiotic Product (PROBIOTIC DAILY PO), folic acid,  Calcium Carbonate-Vitamin D (CALTRATE COLON HEALTH PO), Cranberry, RESTASIS, ondansetron, promethazine, ALPRAZolam, citalopram, cyclobenzaprine, estradiol, fluticasone, fluticasone, furosemide, levothyroxine, montelukast, naproxen, pramipexole, ranitidine, pantoprazole, benzonatate, hydroxyurea, lactulose, venlafaxine XR, venlafaxine XR, cetirizine, Vitamin D (Ergocalciferol), fluconazole, budesonide-formoterol, meclizine, and HYDROcodone-acetaminophen.  Meds ordered this encounter  Medications  . HYDROcodone-acetaminophen (NORCO) 5-325 MG tablet    Sig: Take 1 tablet by mouth every 6 (six) hours as needed for severe pain. Take one tablet by mouth every 6 hours as needed for severe pain    Dispense:  120 tablet    Refill:  0  . DISCONTD: levofloxacin (LEVAQUIN) 500 MG tablet    Sig: Take 1 tablet (500 mg total) by mouth daily.    Dispense:  10 tablet    Refill:  0     Penni Homans, MD

## 2016-07-02 NOTE — Assessment & Plan Note (Signed)
Improving after treatment 

## 2016-07-02 NOTE — Assessment & Plan Note (Signed)
Well controlled, no changes to meds. Encouraged heart healthy diet such as the DASH diet and exercise as tolerated.  °

## 2016-07-02 NOTE — Assessment & Plan Note (Signed)
Avoid offending foods, start probiotics. Do not eat large meals in late evening and consider raising head of bed. Continue ranitdine and protonix and may use prometazine prn

## 2016-07-02 NOTE — Assessment & Plan Note (Signed)
Encouraged heart healthy diet, increase exercise, avoid trans fats, consider a krill oil cap daily 

## 2016-07-02 NOTE — Assessment & Plan Note (Signed)
hgba1c acceptable, minimize simple carbs. Increase exercise as tolerated.  

## 2016-07-08 ENCOUNTER — Other Ambulatory Visit: Payer: Self-pay | Admitting: Family Medicine

## 2016-07-18 ENCOUNTER — Other Ambulatory Visit (HOSPITAL_BASED_OUTPATIENT_CLINIC_OR_DEPARTMENT_OTHER): Payer: BLUE CROSS/BLUE SHIELD

## 2016-07-18 ENCOUNTER — Ambulatory Visit (HOSPITAL_BASED_OUTPATIENT_CLINIC_OR_DEPARTMENT_OTHER): Payer: BLUE CROSS/BLUE SHIELD | Admitting: Family

## 2016-07-18 VITALS — BP 133/77 | HR 85 | Temp 98.2°F | Resp 20 | Wt 123.0 lb

## 2016-07-18 DIAGNOSIS — D508 Other iron deficiency anemias: Secondary | ICD-10-CM

## 2016-07-18 DIAGNOSIS — D473 Essential (hemorrhagic) thrombocythemia: Secondary | ICD-10-CM

## 2016-07-18 LAB — COMPREHENSIVE METABOLIC PANEL
ALT: 10 U/L (ref 0–55)
AST: 16 U/L (ref 5–34)
Albumin: 3.8 g/dL (ref 3.5–5.0)
Alkaline Phosphatase: 67 U/L (ref 40–150)
Anion Gap: 6 mEq/L (ref 3–11)
BUN: 14 mg/dL (ref 7.0–26.0)
CO2: 29 mEq/L (ref 22–29)
Calcium: 9.3 mg/dL (ref 8.4–10.4)
Chloride: 104 mEq/L (ref 98–109)
Creatinine: 0.7 mg/dL (ref 0.6–1.1)
EGFR: 88 mL/min/{1.73_m2} — ABNORMAL LOW (ref >90)
Glucose: 88 mg/dl (ref 70–140)
Potassium: 3.9 mEq/L (ref 3.5–5.1)
Sodium: 139 mEq/L (ref 136–145)
Total Bilirubin: 0.22 mg/dL (ref 0.20–1.20)
Total Protein: 7.4 g/dL (ref 6.4–8.3)

## 2016-07-18 LAB — CBC WITH DIFFERENTIAL (CANCER CENTER ONLY)
BASO#: 0 10*3/uL (ref 0.0–0.2)
BASO%: 0.6 % (ref 0.0–2.0)
EOS%: 3.5 % (ref 0.0–7.0)
Eosinophils Absolute: 0.1 10*3/uL (ref 0.0–0.5)
HCT: 31.7 % — ABNORMAL LOW (ref 34.8–46.6)
HGB: 10.7 g/dL — ABNORMAL LOW (ref 11.6–15.9)
LYMPH#: 1.2 10*3/uL (ref 0.9–3.3)
LYMPH%: 38.4 % (ref 14.0–48.0)
MCH: 43 pg — ABNORMAL HIGH (ref 26.0–34.0)
MCHC: 33.8 g/dL (ref 32.0–36.0)
MCV: 127 fL — ABNORMAL HIGH (ref 81–101)
MONO#: 0.2 10*3/uL (ref 0.1–0.9)
MONO%: 5.8 % (ref 0.0–13.0)
NEUT#: 1.6 10*3/uL (ref 1.5–6.5)
NEUT%: 51.7 % (ref 39.6–80.0)
Platelets: 352 10*3/uL (ref 145–400)
RBC: 2.49 10*6/uL — ABNORMAL LOW (ref 3.70–5.32)
RDW: 12.5 % (ref 11.1–15.7)
WBC: 3.1 10*3/uL — ABNORMAL LOW (ref 3.9–10.0)

## 2016-07-18 LAB — FERRITIN: Ferritin: 321 ng/ml — ABNORMAL HIGH (ref 9–269)

## 2016-07-18 LAB — IRON AND TIBC
%SAT: 48 % (ref 21–57)
Iron: 89 ug/dL (ref 41–142)
TIBC: 188 ug/dL — ABNORMAL LOW (ref 236–444)
UIBC: 98 ug/dL — ABNORMAL LOW (ref 120–384)

## 2016-07-18 LAB — LACTATE DEHYDROGENASE: LDH: 191 U/L (ref 125–245)

## 2016-07-18 NOTE — Progress Notes (Signed)
Hematology and Oncology Follow Up Visit  Rachael Johnson [id number] 01-20-1951 65 y.o. 07/18/2016   Principle Diagnosis:  1. 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 Intermittent iron deficiency anemia  Current Therapy:   Hydrea 1000 mg by mouth daily - changed to alternating daily 1000 mg PO one day, 500 mg PO the next, etc... 07/18/16 IV iron as indicated - last received 2 doses in July    Interim History:  Rachael Johnson is here today for follow-up. She is doing well and ready to enjoy Christmas with her sweet family.  She has had no issues with bleed, bruising or petechiae. Platelet count today is 352. She verbalized that she is taking her Hydrea 1,000 mg PO daily as prescribed.  No fever, chills, n/v, cough, rash, dizziness, chest pain, palpitations, abdominal pain or changes in bowel of bladder habits.  No lymphadenopathy found on exam. No falls or syncope.  She has chronic SOB with exertion and a dry cough due to COPD. This is unchanged.  No swelling, numbness or tingling in her extremities. She has arthritic pain in her joints that comes and goes.  She has maintained a good appetite and is staying well hydrated.  Medications:  Allergies as of 07/18/2016   No Known Allergies     Medication List       Accurate as of 07/18/16 10:14 AM. Always use your most recent med list.          ALPRAZolam 1 MG tablet Commonly known as:  XANAX TAKE 1 TABLET THREE TIMES A DAY AS NEEDED FOR ANXIETY   amitriptyline 10 MG tablet Commonly known as:  ELAVIL Take 2 tablets (20 mg total) by mouth at bedtime.   aspirin EC 81 MG tablet Take 81 mg by mouth every morning.   benzonatate 200 MG capsule Commonly known as:  TESSALON Take 1 capsule (200 mg total) by mouth at bedtime as needed for cough.   budesonide-formoterol 160-4.5 MCG/ACT inhaler Commonly known as:  SYMBICORT Inhale 2 puffs into the lungs 2 (two) times daily.   CALTRATE COLON HEALTH PO Take by mouth.   cetirizine  10 MG tablet Commonly known as:  ZYRTEC Take 1 tablet (10 mg total) by mouth daily.   citalopram 40 MG tablet Commonly known as:  CELEXA Take 1 tablet (40 mg total) by mouth every morning.   Cranberry 300 MG tablet Take 300 mg by mouth 2 (two) times daily.   cyclobenzaprine 10 MG tablet Commonly known as:  FLEXERIL Take 1 tablet (10 mg total) by mouth 2 (two) times daily as needed for muscle spasms.   estradiol 0.5 MG tablet Commonly known as:  ESTRACE Take 1 tablet (0.5 mg total) by mouth 2 (two) times daily.   Fish Oil 1000 MG Caps Take by mouth 2 (two) times daily.   fluorometholone 0.1 % ophthalmic suspension Commonly known as:  FML Place 1 drop into both eyes daily as needed (For dry eyes.).   fluticasone 110 MCG/ACT inhaler Commonly known as:  FLOVENT HFA Inhale 2 puffs into the lungs 2 (two) times daily.   fluticasone 50 MCG/ACT nasal spray Commonly known as:  FLONASE Place 2 sprays into both nostrils daily.   folic acid 1 MG tablet Commonly known as:  FOLVITE Take 1 mg by mouth daily.   furosemide 20 MG tablet Commonly known as:  LASIX Take 1 tablet (20 mg total) by mouth 2 (two) times daily as needed for fluid or edema.   HYDROcodone-acetaminophen 5-325 MG  tablet Commonly known as:  NORCO Take 1 tablet by mouth every 6 (six) hours as needed for severe pain. Take one tablet by mouth every 6 hours as needed for severe pain   hydroxyurea 500 MG capsule Commonly known as:  HYDREA Take 2 capsules (1,000 mg total) by mouth daily. 90 day supply.   lactulose 10 GM/15ML solution Commonly known as:  CHRONULAC TAKE 2 TABLESPOONFULS (30 ML) BY MOUTH 3 TIMES A DAY FOR CONSTIPATION   levothyroxine 88 MCG tablet Commonly known as:  SYNTHROID, LEVOTHROID Take 1 tablet (88 mcg total) by mouth daily.   meclizine 12.5 MG tablet Commonly known as:  ANTIVERT TAKE 1 TABLET THREE TIMES A DAY AS NEEDED FOR NAUSEA OR DIZZINESS   montelukast 10 MG tablet Commonly known  as:  SINGULAIR Take 1 tablet (10 mg total) by mouth at bedtime.   MULTIVITAMIN PO Take 1 tablet by mouth every morning.   naproxen 500 MG tablet Commonly known as:  NAPROSYN TAKE 1 TABLET (500 MG TOTAL) BY MOUTH 2 (TWO) TIMES DAILY WITH A MEAL.   ondansetron 8 MG tablet Commonly known as:  ZOFRAN TAKE ONE TABLET BY MOUTH EVERY 8 HOURS AS NEEDED NAUSEA AND VOMITING   pantoprazole 40 MG tablet Commonly known as:  PROTONIX Take 1 tablet (40 mg total) by mouth daily.   pramipexole 0.25 MG tablet Commonly known as:  MIRAPEX TAKE ONE OR TWO TABLETS BY MOUTH AT BEDTIME AS NEEDED   PROBIOTIC DAILY PO Take by mouth 2 (two) times daily.   promethazine 25 MG tablet Commonly known as:  PHENERGAN Take 1 tablet (25 mg total) by mouth every 6 (six) hours as needed for nausea or vomiting.   ranitidine 300 MG tablet Commonly known as:  ZANTAC Take 1 tablet (300 mg total) by mouth at bedtime.   RESTASIS 0.05 % ophthalmic emulsion Generic drug:  cycloSPORINE Place 1 drop into both eyes 2 (two) times daily.   venlafaxine XR 75 MG 24 hr capsule Commonly known as:  EFFEXOR-XR TAKE ONE CAPSULE BY MOUTH DAILY WITH BREAKFAST   venlafaxine XR 150 MG 24 hr capsule Commonly known as:  EFFEXOR-XR Take 1 capsule (150 mg total) by mouth daily with breakfast.   Vitamin D (Ergocalciferol) 50000 units Caps capsule Commonly known as:  DRISDOL TAKE 1 CAPSULE BY MOUTH EVERY OTHER WEEK       Allergies: No Known Allergies  Past Medical History, Surgical history, Social history, and Family History were reviewed and updated.  Review of Systems: All other 10 point review of systems is negative.   Physical Exam:  weight is 123 lb (55.8 kg). Her oral temperature is 98.2 F (36.8 C). Her blood pressure is 133/77 and her pulse is 85. Her respiration is 20.   Wt Readings from Last 3 Encounters:  07/18/16 123 lb (55.8 kg)  06/19/16 127 lb 4 oz (57.7 kg)  05/15/16 129 lb 6.4 oz (58.7 kg)     Ocular: Sclerae unicteric, pupils equal, round and reactive to light Ear-nose-throat: Oropharynx clear, dentition fair Lymphatic: No cervical supraclavicular or axillary adenopathy Lungs no rales or rhonchi, good excursion bilaterally Heart regular rate and rhythm, no murmur appreciated Abd soft, nontender, positive bowel sounds, no liver or spleen tip palpated on exam, no fluid wave MSK no focal spinal tenderness, no joint edema Neuro: non-focal, well-oriented, appropriate affect Breasts: Deferred   Lab Results  Component Value Date   WBC 3.1 (L) 07/18/2016   HGB 10.7 (L) 07/18/2016   HCT  31.7 (L) 07/18/2016   MCV 127 (H) 07/18/2016   PLT 352 07/18/2016   Lab Results  Component Value Date   FERRITIN 549 (H) 05/15/2016   IRON 94 05/15/2016   TIBC 215 (L) 05/15/2016   UIBC 122 05/15/2016   IRONPCTSAT 43 05/15/2016   Lab Results  Component Value Date   RETICCTPCT 1.6 06/02/2015   RBC 2.49 (L) 07/18/2016   RETICCTABS 45.8 06/02/2015   No results found for: KPAFRELGTCHN, LAMBDASER, KAPLAMBRATIO No results found for: IGGSERUM, IGA, IGMSERUM No results found for: Kathrynn Ducking, MSPIKE, SPEI   Chemistry      Component Value Date/Time   NA 138 06/19/2016 1221   NA 135 05/15/2016 0930   NA 140 02/04/2016 0929   K 3.8 06/19/2016 1221   K 3.6 05/15/2016 0930   K 3.6 02/04/2016 0929   CL 103 06/19/2016 1221   CL 98 05/15/2016 0930   CO2 28 06/19/2016 1221   CO2 29 05/15/2016 0930   CO2 26 02/04/2016 0929   BUN 13 06/19/2016 1221   BUN 18 05/15/2016 0930   BUN 11.1 02/04/2016 0929   CREATININE 0.70 06/19/2016 1221   CREATININE 1.1 05/15/2016 0930   CREATININE 0.7 02/04/2016 0929      Component Value Date/Time   CALCIUM 9.2 06/19/2016 1221   CALCIUM 9.2 05/15/2016 0930   CALCIUM 8.9 02/04/2016 0929   ALKPHOS 61 06/19/2016 1221   ALKPHOS 60 05/15/2016 0930   ALKPHOS 98 02/04/2016 0929   AST 16 06/19/2016 1221   AST 27  05/15/2016 0930   AST 17 02/04/2016 0929   ALT 8 06/19/2016 1221   ALT 17 05/15/2016 0930   ALT <9 02/04/2016 0929   BILITOT 0.3 06/19/2016 1221   BILITOT 0.40 05/15/2016 0930   BILITOT <0.30 02/04/2016 0929     Impression and Plan: Rachael Johnson is 65 yo white female with essential thrombocythemia. Her platelet count is stable at 352 with Hgb of 10.7. She is doing well and has no complaints at this time.  We will have her decrease her Hydrea and alternate 1,000 mg PO daily one day and 500 mg PO the next. She verbalized understanding and will start this today.  We will see what her iron studies show and bring her back in later this week for in infusion if needed.  We will plan to see her back in 3 months for repeat labs work and follow-up.  She will contact our office with any questions or concerns. We can certainly see her sooner if need be.   Eliezer Bottom, NP 12/19/201710:14 AM

## 2016-07-20 ENCOUNTER — Other Ambulatory Visit: Payer: Self-pay | Admitting: Family Medicine

## 2016-07-20 ENCOUNTER — Telehealth: Payer: Self-pay | Admitting: *Deleted

## 2016-07-20 NOTE — Telephone Encounter (Signed)
PCP approved/printed refill for alprazolam Faxed hardcopy to [facility] in [place]

## 2016-07-20 NOTE — Telephone Encounter (Signed)
Requesting:   Alprazolam Contract    08/17/2014 UDS    moderate Last OV   06/19/2016 Last Refill   #90 with 1 refill on 04/18/2016  Please Advise

## 2016-07-20 NOTE — Telephone Encounter (Addendum)
Patient aware of results  ----- Message from Eliezer Bottom, NP sent at 07/19/2016  3:57 PM EST ----- Regarding: Iron  Iron looks good. No infusion needed. Thanks!  Sarah  ----- Message ----- From: [name], MD Sent: 07/18/2016   8:33 PM To: Eliezer Bottom, NP    ----- Message ----- From: Interface, Lab In Three Zero One Sent: 07/18/2016  10:01 AM To: [name], MD

## 2016-07-29 ENCOUNTER — Other Ambulatory Visit: Payer: Self-pay | Admitting: Family Medicine

## 2016-07-29 DIAGNOSIS — R300 Dysuria: Secondary | ICD-10-CM

## 2016-07-29 DIAGNOSIS — R739 Hyperglycemia, unspecified: Secondary | ICD-10-CM

## 2016-07-29 DIAGNOSIS — E559 Vitamin D deficiency, unspecified: Secondary | ICD-10-CM

## 2016-07-29 DIAGNOSIS — I10 Essential (primary) hypertension: Secondary | ICD-10-CM

## 2016-07-31 ENCOUNTER — Other Ambulatory Visit: Payer: Self-pay | Admitting: Family Medicine

## 2016-08-01 NOTE — Telephone Encounter (Signed)
Requesting:  amitriptyline Contract     08/17/2014 UDS    Moderate was due in September 2016 Last OV    06/19/2016 Last Refill   #60 with 0 refills on 06/27/2016  Please Advise

## 2016-08-05 ENCOUNTER — Other Ambulatory Visit: Payer: Self-pay | Admitting: Hematology & Oncology

## 2016-08-08 ENCOUNTER — Other Ambulatory Visit: Payer: Self-pay | Admitting: Family Medicine

## 2016-08-10 ENCOUNTER — Other Ambulatory Visit: Payer: Self-pay | Admitting: Family Medicine

## 2016-08-10 DIAGNOSIS — R739 Hyperglycemia, unspecified: Secondary | ICD-10-CM

## 2016-08-10 DIAGNOSIS — I10 Essential (primary) hypertension: Secondary | ICD-10-CM

## 2016-08-10 DIAGNOSIS — E559 Vitamin D deficiency, unspecified: Secondary | ICD-10-CM

## 2016-08-10 DIAGNOSIS — R300 Dysuria: Secondary | ICD-10-CM

## 2016-08-28 ENCOUNTER — Encounter: Payer: Self-pay | Admitting: Family Medicine

## 2016-08-28 ENCOUNTER — Ambulatory Visit (INDEPENDENT_AMBULATORY_CARE_PROVIDER_SITE_OTHER): Payer: BLUE CROSS/BLUE SHIELD | Admitting: Family Medicine

## 2016-08-28 VITALS — BP 130/70 | HR 78 | Temp 98.1°F | Wt 132.2 lb

## 2016-08-28 DIAGNOSIS — E559 Vitamin D deficiency, unspecified: Secondary | ICD-10-CM

## 2016-08-28 DIAGNOSIS — K219 Gastro-esophageal reflux disease without esophagitis: Secondary | ICD-10-CM

## 2016-08-28 DIAGNOSIS — E785 Hyperlipidemia, unspecified: Secondary | ICD-10-CM | POA: Diagnosis not present

## 2016-08-28 DIAGNOSIS — D539 Nutritional anemia, unspecified: Secondary | ICD-10-CM

## 2016-08-28 DIAGNOSIS — R300 Dysuria: Secondary | ICD-10-CM

## 2016-08-28 DIAGNOSIS — I10 Essential (primary) hypertension: Secondary | ICD-10-CM

## 2016-08-28 DIAGNOSIS — R739 Hyperglycemia, unspecified: Secondary | ICD-10-CM

## 2016-08-28 DIAGNOSIS — J189 Pneumonia, unspecified organism: Secondary | ICD-10-CM | POA: Diagnosis not present

## 2016-08-28 DIAGNOSIS — F418 Other specified anxiety disorders: Secondary | ICD-10-CM

## 2016-08-28 MED ORDER — RANITIDINE HCL 300 MG PO TABS: 300.0000 mg | ORAL_TABLET | Freq: Every day | ORAL | 1 refills | Status: DC

## 2016-08-28 MED ORDER — NAPROXEN 500 MG PO TABS: ORAL_TABLET | ORAL | 1 refills | Status: DC

## 2016-08-28 MED ORDER — ALPRAZOLAM 1 MG PO TABS: 1.0000 mg | ORAL_TABLET | Freq: Three times a day (TID) | ORAL | 1 refills | Status: DC | PRN

## 2016-08-28 MED ORDER — AMITRIPTYLINE HCL 10 MG PO TABS: 20.0000 mg | ORAL_TABLET | Freq: Every day | ORAL | 1 refills | Status: DC

## 2016-08-28 MED ORDER — ESTRADIOL 0.5 MG PO TABS: 0.5000 mg | ORAL_TABLET | Freq: Two times a day (BID) | ORAL | 1 refills | Status: DC

## 2016-08-28 MED ORDER — PRAMIPEXOLE DIHYDROCHLORIDE 0.25 MG PO TABS: ORAL_TABLET | ORAL | 1 refills | Status: DC

## 2016-08-28 MED ORDER — MECLIZINE HCL 12.5 MG PO TABS: ORAL_TABLET | ORAL | 1 refills | Status: DC

## 2016-08-28 MED ORDER — ALBUTEROL SULFATE HFA 108 (90 BASE) MCG/ACT IN AERS: INHALATION_SPRAY | RESPIRATORY_TRACT | 8 refills | Status: DC

## 2016-08-28 MED ORDER — PANTOPRAZOLE SODIUM 40 MG PO TBEC: 40.0000 mg | DELAYED_RELEASE_TABLET | Freq: Every day | ORAL | 5 refills | Status: DC

## 2016-08-28 MED ORDER — HYDROCODONE-ACETAMINOPHEN 5-325 MG PO TABS: 1.0000 | ORAL_TABLET | Freq: Four times a day (QID) | ORAL | 0 refills | Status: DC | PRN

## 2016-08-28 MED ORDER — BUDESONIDE-FORMOTEROL FUMARATE 160-4.5 MCG/ACT IN AERO: 2.0000 | INHALATION_SPRAY | Freq: Two times a day (BID) | RESPIRATORY_TRACT | 1 refills | Status: DC

## 2016-08-28 MED ORDER — VENLAFAXINE HCL ER 150 MG PO CP24: 150.0000 mg | ORAL_CAPSULE | Freq: Every day | ORAL | 1 refills | Status: DC

## 2016-08-28 MED ORDER — FLUTICASONE PROPIONATE 50 MCG/ACT NA SUSP: 2.0000 | Freq: Every day | NASAL | 6 refills | Status: DC

## 2016-08-28 MED ORDER — FUROSEMIDE 20 MG PO TABS: 20.0000 mg | ORAL_TABLET | Freq: Two times a day (BID) | ORAL | 1 refills | Status: DC | PRN

## 2016-08-28 MED ORDER — VENLAFAXINE HCL ER 75 MG PO CP24: ORAL_CAPSULE | ORAL | 1 refills | Status: DC

## 2016-08-28 MED ORDER — CITALOPRAM HYDROBROMIDE 40 MG PO TABS: 40.0000 mg | ORAL_TABLET | Freq: Every morning | ORAL | 1 refills | Status: DC

## 2016-08-28 NOTE — Progress Notes (Signed)
Subjective:    Patient ID: Rachael Johnson, female    DOB: [birth year], 66 y.o.   MRN: [id number]  Chief Complaint  Patient presents with  . Follow-up    h.pylori infection    HPI Patient is in today for a follow up for a h.pylori infection. Patient states that she is feeling better. Is also following up HTN, constipation, hypothyroidism, hyperlipidemia. No additional concerns noted. When she takes her fiber and meds regularly her bowels move without blood or excessive effort. She denies any suicidal ideation but she does note some anhedonia at times. Has has some teeth extracted recently with improvement in headaches. She feels her pneumonia has improved. She denies cough, fevers and more. Denies CP/palp/SOB/HA/congestion/fevers or GU c/o. Taking meds as prescribed  Past Medical History:  Diagnosis Date  . Acute pharyngitis 07/13/2014  . Allergy   . Anemia, iron deficiency 07/08/2012  . Arthritis   . Cataract 12/21/2014  . Cerumen impaction 04/28/2013  . Chest pain   . Depression   . Depression with anxiety 03/24/2011  . Diverticulitis   . Esophageal reflux 08/17/2013  . Essential thrombocythemia ([place]) 01/24/2011  . Frequent episodic tension-type headache   . Gastroenteritis 12/21/2014  . 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 GERD (gastroesophageal reflux disease)   . H. pylori infection 12/19/2012  . Hyperglycemia 12/17/2015  . Hyperglycemia 12/17/2015  . Hypertension   . Normal cardiac stress test   . Other and unspecified hyperlipidemia 02/25/2013  . Pedal edema 03/07/2016  . Pneumonia 04/29/2016  . Preventative health care 04/20/2013  . Restless leg syndrome 09/30/2014  . Scabies 03/28/2015  . Seizure ([place])    childhood  . Thyroid disease     Past Surgical History:  Procedure Laterality Date  . ABDOMINAL HYSTERECTOMY     1992  . BREAST SURGERY  1992   biopsy, benign. Fibrocystic  . UMBILICAL HERNIA REPAIR N/A 09/25/2012   Procedure: HERNIA REPAIR UMBILICAL ADULT;  Surgeon: Harl Bowie, MD;  Location: [facility];  Service: General;  Laterality: N/A;    Family History  Problem Relation Age of Onset  . Arthritis Mother   . Cancer Mother     ovarian  . Hypertension Mother   . Heart disease Mother     pacer  . Heart failure Mother   . Arthritis Father   . Cancer Father     lung  . Hypertension Father   . Cancer Sister     lung  . Cancer Brother     prostate  . Cancer Brother     lung  . Hypertension Son   . COPD Brother   . Heart disease Brother   . Hypertension Brother   . Cancer Brother     colon  . Alcohol abuse Brother   . Cirrhosis Brother   . Seizures Sister   . Stroke Sister   . Arthritis Brother     Social History   Social History  . Marital status: Widowed    Spouse name: N/A  . Number of children: 2  . Years of education: N/A   Occupational History  . APL operator [employer]   Social History Main Topics  . Smoking status: Former Smoker    Packs/day: 3.50    Years: 20.00    Types: Cigarettes    Start date: 11/15/1970    Quit date: 07/31/1990  . Smokeless tobacco: Never Used     Comment: quit 23 years ago  .  Alcohol use No  . Drug use: No  . Sexual activity: No   Other Topics Concern  . Not on file   Social History Narrative   Regular exercise: no   Caffeine Use: 2-3 weekly    Outpatient Medications Prior to Visit  Medication Sig Dispense Refill  . aspirin EC 81 MG tablet Take 81 mg by mouth every morning.    . benzonatate (TESSALON) 200 MG capsule Take 1 capsule (200 mg total) by mouth at bedtime as needed for cough. 90 capsule 1  . Calcium Carbonate-Vitamin D (CALTRATE COLON HEALTH PO) Take by mouth.    . cetirizine (ZYRTEC) 10 MG tablet Take 1 tablet (10 mg total) by mouth daily. 90 tablet 1  . Cranberry 300 MG tablet Take 300 mg by mouth 2 (two) times daily.    . cyclobenzaprine (FLEXERIL) 10 MG tablet Take 1 tablet (10 mg total) by mouth 2 (two) times daily as needed for muscle spasms. 60 tablet 3    . fluorometholone (FML) 0.1 % ophthalmic suspension Place 1 drop into both eyes daily as needed (For dry eyes.).     Marland Kitchen folic acid (FOLVITE) 1 MG tablet Take 1 mg by mouth daily.    . hydroxyurea (HYDREA) 500 MG capsule Take 2 capsules (1,000 mg total) by mouth daily. 90 day supply. 90 capsule 3  . lactulose (CHRONULAC) 10 GM/15ML solution TAKE 2 TABLESPOONFULS (30 ML) BY MOUTH 3 TIMES A DAY FOR CONSTIPATION 1000 mL 8  . levothyroxine (SYNTHROID, LEVOTHROID) 88 MCG tablet Take 1 tablet (88 mcg total) by mouth daily. 30 tablet 5  . montelukast (SINGULAIR) 10 MG tablet Take 1 tablet (10 mg total) by mouth at bedtime. 30 tablet 5  . Multiple Vitamin (MULTIVITAMIN PO) Take 1 tablet by mouth every morning.     . Omega-3 Fatty Acids (FISH OIL) 1000 MG CAPS Take by mouth 2 (two) times daily.    . ondansetron (ZOFRAN) 8 MG tablet TAKE ONE TABLET BY MOUTH EVERY 8 HOURS AS NEEDED NAUSEA AND VOMITING 20 tablet 0  . Probiotic Product (PROBIOTIC DAILY PO) Take by mouth 2 (two) times daily.    . promethazine (PHENERGAN) 25 MG tablet Take 1 tablet (25 mg total) by mouth every 6 (six) hours as needed for nausea or vomiting. 60 tablet 4  . RESTASIS 0.05 % ophthalmic emulsion Place 1 drop into both eyes 2 (two) times daily.   3  . Vitamin D, Ergocalciferol, (DRISDOL) 50000 units CAPS capsule TAKE 1 CAPSULE BY MOUTH EVERY OTHER WEEK 6 capsule 0  . ALPRAZolam (XANAX) 1 MG tablet TAKE 1 TABLET THREE TIMES A DAY AS NEEDED FOR ANXIETY 90 tablet 1  . amitriptyline (ELAVIL) 10 MG tablet TAKE 2 TABLETS (20 MG TOTAL) BY MOUTH AT BEDTIME. 60 tablet 0  . budesonide-formoterol (SYMBICORT) 160-4.5 MCG/ACT inhaler Inhale 2 puffs into the lungs 2 (two) times daily. 10.2 g 1  . citalopram (CELEXA) 40 MG tablet Take 1 tablet (40 mg total) by mouth every morning. 30 tablet 3  . estradiol (ESTRACE) 0.5 MG tablet Take 1 tablet (0.5 mg total) by mouth 2 (two) times daily. 180 tablet 1  . fluticasone (FLONASE) 50 MCG/ACT nasal spray  Place 2 sprays into both nostrils daily. 16 g 6  . fluticasone (FLOVENT HFA) 110 MCG/ACT inhaler Inhale 2 puffs into the lungs 2 (two) times daily. 1 Inhaler 12  . furosemide (LASIX) 20 MG tablet TAKE 1 TABLET (20 MG TOTAL) BY MOUTH 2 (TWO) TIMES DAILY  AS NEEDED FOR FLUID OR EDEMA. 90 tablet 1  . HYDROcodone-acetaminophen (NORCO) 5-325 MG tablet Take 1 tablet by mouth every 6 (six) hours as needed for severe pain. Take one tablet by mouth every 6 hours as needed for severe pain 120 tablet 0  . meclizine (ANTIVERT) 12.5 MG tablet TAKE 1 TABLET THREE TIMES A DAY AS NEEDED FOR NAUSEA OR DIZZINESS 10 tablet 0  . naproxen (NAPROSYN) 500 MG tablet TAKE 1 TABLET (500 MG TOTAL) BY MOUTH 2 (TWO) TIMES DAILY WITH A MEAL. 180 tablet 1  . pantoprazole (PROTONIX) 40 MG tablet Take 1 tablet (40 mg total) by mouth daily. 30 tablet 5  . pramipexole (MIRAPEX) 0.25 MG tablet TAKE ONE OR TWO TABLETS BY MOUTH AT BEDTIME AS NEEDED 60 tablet 1  . ranitidine (ZANTAC) 300 MG tablet Take 1 tablet (300 mg total) by mouth at bedtime. 30 tablet 5  . venlafaxine XR (EFFEXOR-XR) 150 MG 24 hr capsule Take 1 capsule (150 mg total) by mouth daily with breakfast. 90 capsule 1  . venlafaxine XR (EFFEXOR-XR) 75 MG 24 hr capsule TAKE ONE CAPSULE BY MOUTH DAILY WITH BREAKFAST 90 capsule 1   No facility-administered medications prior to visit.     No Known Allergies  Review of Systems  Constitutional: Negative for fever and malaise/fatigue.  HENT: Negative for congestion.   Eyes: Negative for blurred vision.  Respiratory: Negative for cough and shortness of breath.   Cardiovascular: Negative for chest pain, palpitations and leg swelling.  Gastrointestinal: Positive for constipation and heartburn. Negative for vomiting.  Musculoskeletal: Negative for back pain.  Skin: Negative for rash.  Neurological: Positive for headaches. Negative for loss of consciousness.       Objective:    Physical Exam  Constitutional: She is  oriented to person, place, and time. She appears well-developed and well-nourished. No distress.  HENT:  Head: Normocephalic and atraumatic.  Eyes: Conjunctivae are normal.  Neck: Normal range of motion. No thyromegaly present.  Cardiovascular: Normal rate and regular rhythm.   Pulmonary/Chest: Effort normal and breath sounds normal. She has no wheezes.  Abdominal: Soft. Bowel sounds are normal. There is no tenderness.  Musculoskeletal: She exhibits no edema or deformity.  Neurological: She is alert and oriented to person, place, and time.  Skin: Skin is warm and dry. She is not diaphoretic.  Psychiatric: She has a normal mood and affect.    BP 130/70 (BP Location: Left Arm, Patient Position: Sitting, Cuff Size: Normal)   Pulse 78   Temp 98.1 F (36.7 C) (Oral)   Wt 132 lb 3.2 oz (60 kg)   LMP 07/31/1990   SpO2 98% Comment: RA  BMI 22.69 kg/m  Wt Readings from Last 3 Encounters:  08/28/16 132 lb 3.2 oz (60 kg)  07/18/16 123 lb (55.8 kg)  06/19/16 127 lb 4 oz (57.7 kg)     Lab Results  Component Value Date   WBC 3.1 (L) 07/18/2016   HGB 10.7 (L) 07/18/2016   HCT 31.7 (L) 07/18/2016   PLT 352 07/18/2016   GLUCOSE 88 07/18/2016   CHOL 176 06/19/2016   TRIG 70.0 06/19/2016   HDL 69.80 06/19/2016   LDLCALC 92 06/19/2016   ALT 10 07/18/2016   AST 16 07/18/2016   NA 139 07/18/2016   K 3.9 07/18/2016   CL 103 06/19/2016   CREATININE 0.7 07/18/2016   BUN 14.0 07/18/2016   CO2 29 07/18/2016   TSH 2.05 06/19/2016   HGBA1C 5.4 06/19/2016  Lab Results  Component Value Date   TSH 2.05 06/19/2016   Lab Results  Component Value Date   WBC 3.1 (L) 07/18/2016   HGB 10.7 (L) 07/18/2016   HCT 31.7 (L) 07/18/2016   MCV 127 (H) 07/18/2016   PLT 352 07/18/2016   Lab Results  Component Value Date   NA 139 07/18/2016   K 3.9 07/18/2016   CHLORIDE 104 07/18/2016   CO2 29 07/18/2016   GLUCOSE 88 07/18/2016   BUN 14.0 07/18/2016   CREATININE 0.7 07/18/2016   BILITOT  0.22 07/18/2016   ALKPHOS 67 07/18/2016   AST 16 07/18/2016   ALT 10 07/18/2016   PROT 7.4 07/18/2016   ALBUMIN 3.8 07/18/2016   CALCIUM 9.3 07/18/2016   ANIONGAP 6 07/18/2016   EGFR 88 (L) 07/18/2016   GFR 89.43 06/19/2016   Lab Results  Component Value Date   CHOL 176 06/19/2016   Lab Results  Component Value Date   HDL 69.80 06/19/2016   Lab Results  Component Value Date   LDLCALC 92 06/19/2016   Lab Results  Component Value Date   TRIG 70.0 06/19/2016   Lab Results  Component Value Date   CHOLHDL 3 06/19/2016   Lab Results  Component Value Date   HGBA1C 5.4 06/19/2016       Assessment & Plan:   Problem List Items Addressed This Visit    Depression with anxiety    Is doing well on current meds. No changes to meds at this time.       Macrocytic anemia    Following with hematology      Hyperlipidemia    Encouraged heart healthy diet, increase exercise, avoid trans fats, consider a krill oil cap daily      Relevant Medications   furosemide (LASIX) 20 MG tablet   Esophageal reflux    Avoid offending foods, start probiotics. Do not eat large meals in late evening and consider raising head of bed.       Relevant Medications   meclizine (ANTIVERT) 12.5 MG tablet   pantoprazole (PROTONIX) 40 MG tablet   ranitidine (ZANTAC) 300 MG tablet   Hyperglycemia    hgba1c acceptable, minimize simple carbs. Increase exercise as tolerated.       Relevant Medications   HYDROcodone-acetaminophen (NORCO) 5-325 MG tablet   furosemide (LASIX) 20 MG tablet   meclizine (ANTIVERT) 12.5 MG tablet   Vitamin D deficiency   Relevant Medications   HYDROcodone-acetaminophen (NORCO) 5-325 MG tablet   furosemide (LASIX) 20 MG tablet   meclizine (ANTIVERT) 12.5 MG tablet   Dysuria   Relevant Medications   HYDROcodone-acetaminophen (NORCO) 5-325 MG tablet   furosemide (LASIX) 20 MG tablet   meclizine (ANTIVERT) 12.5 MG tablet   Essential hypertension    Well controlled,  no changes to meds. Encouraged heart healthy diet such as the DASH diet and exercise as tolerated.       Relevant Medications   HYDROcodone-acetaminophen (NORCO) 5-325 MG tablet   furosemide (LASIX) 20 MG tablet   meclizine (ANTIVERT) 12.5 MG tablet   CAP (community acquired pneumonia)    Patient feeling much better, CXR ordered to confirm resolution.       Relevant Medications   budesonide-formoterol (SYMBICORT) 160-4.5 MCG/ACT inhaler   fluticasone (FLONASE) 50 MCG/ACT nasal spray   albuterol (VENTOLIN HFA) 108 (90 Base) MCG/ACT inhaler    Other Visit Diagnoses    Pneumonia due to infectious organism, unspecified laterality, unspecified part of lung    -  Primary   Relevant Medications   budesonide-formoterol (SYMBICORT) 160-4.5 MCG/ACT inhaler   fluticasone (FLONASE) 50 MCG/ACT nasal spray   albuterol (VENTOLIN HFA) 108 (90 Base) MCG/ACT inhaler   Other Relevant Orders   DG Chest 2 View      I have changed Ms. Haas's VENTOLIN HFA to albuterol. I have also changed her ALPRAZolam. I am also having her maintain her Multiple Vitamin (MULTIVITAMIN PO), fluorometholone, aspirin EC, Fish Oil, Probiotic Product (PROBIOTIC DAILY PO), folic acid, Calcium Carbonate-Vitamin D (CALTRATE COLON HEALTH PO), Cranberry, RESTASIS, ondansetron, promethazine, cyclobenzaprine, levothyroxine, montelukast, benzonatate, hydroxyurea, lactulose, cetirizine, Vitamin D (Ergocalciferol), HYDROcodone-acetaminophen, amitriptyline, budesonide-formoterol, citalopram, estradiol, fluticasone, furosemide, meclizine, naproxen, pantoprazole, pramipexole, ranitidine, venlafaxine XR, and venlafaxine XR.  Meds ordered this encounter  Medications  . ALPRAZolam (XANAX) 1 MG tablet    Sig: Take 1 tablet (1 mg total) by mouth 3 (three) times daily as needed for anxiety.    Dispense:  180 tablet    Refill:  1    Not to exceed 4 additional fills before 10/15/2016  . HYDROcodone-acetaminophen (NORCO) 5-325 MG tablet     Sig: Take 1 tablet by mouth every 6 (six) hours as needed for severe pain. Take one tablet by mouth every 6 hours as needed for severe pain    Dispense:  120 tablet    Refill:  0  . amitriptyline (ELAVIL) 10 MG tablet    Sig: Take 2 tablets (20 mg total) by mouth at bedtime.    Dispense:  90 tablet    Refill:  1  . budesonide-formoterol (SYMBICORT) 160-4.5 MCG/ACT inhaler    Sig: Inhale 2 puffs into the lungs 2 (two) times daily.    Dispense:  10.2 g    Refill:  1  . citalopram (CELEXA) 40 MG tablet    Sig: Take 1 tablet (40 mg total) by mouth every morning.    Dispense:  90 tablet    Refill:  1  . estradiol (ESTRACE) 0.5 MG tablet    Sig: Take 1 tablet (0.5 mg total) by mouth 2 (two) times daily.    Dispense:  180 tablet    Refill:  1  . fluticasone (FLONASE) 50 MCG/ACT nasal spray    Sig: Place 2 sprays into both nostrils daily.    Dispense:  16 g    Refill:  6  . furosemide (LASIX) 20 MG tablet    Sig: Take 1 tablet (20 mg total) by mouth 2 (two) times daily as needed for fluid or edema.    Dispense:  90 tablet    Refill:  1  . meclizine (ANTIVERT) 12.5 MG tablet    Sig: TAKE 1 TABLET THREE TIMES A DAY AS NEEDED FOR NAUSEA OR DIZZINESS    Dispense:  30 tablet    Refill:  1  . naproxen (NAPROSYN) 500 MG tablet    Sig: TAKE 1 TABLET (500 MG TOTAL) BY MOUTH 2 (TWO) TIMES DAILY WITH A MEAL.    Dispense:  180 tablet    Refill:  1  . pantoprazole (PROTONIX) 40 MG tablet    Sig: Take 1 tablet (40 mg total) by mouth daily.    Dispense:  30 tablet    Refill:  5  . pramipexole (MIRAPEX) 0.25 MG tablet    Sig: TAKE ONE OR TWO TABLETS BY MOUTH AT BEDTIME AS NEEDED    Dispense:  60 tablet    Refill:  1  . ranitidine (ZANTAC) 300 MG tablet    Sig: Take  1 tablet (300 mg total) by mouth at bedtime.    Dispense:  90 tablet    Refill:  1  . venlafaxine XR (EFFEXOR-XR) 75 MG 24 hr capsule    Sig: TAKE ONE CAPSULE BY MOUTH DAILY WITH BREAKFAST    Dispense:  90 capsule    Refill:  1    . venlafaxine XR (EFFEXOR-XR) 150 MG 24 hr capsule    Sig: Take 1 capsule (150 mg total) by mouth daily with breakfast.    Dispense:  90 capsule    Refill:  1  . albuterol (VENTOLIN HFA) 108 (90 Base) MCG/ACT inhaler    Sig: INHALE 2 PUFFS INTO THE LUNGS EVERY 6 (SIX) HOURS AS  NEEDED FOR WHEEZI NG OR SHORTNESS OF BREATH.    Dispense:  18 g    Refill:  8    CMA served as scribe during this visit. History, Physical and Plan performed by medical provider. Documentation and orders reviewed and attested to.  Penni Homans, MD

## 2016-08-28 NOTE — Assessment & Plan Note (Signed)
Avoid offending foods, start probiotics. Do not eat large meals in late evening and consider raising head of bed.  

## 2016-08-28 NOTE — Assessment & Plan Note (Signed)
Encouraged heart healthy diet, increase exercise, avoid trans fats, consider a krill oil cap daily 

## 2016-08-28 NOTE — Progress Notes (Signed)
Pre visit review using our clinic review tool, if applicable. No additional management support is needed unless otherwise documented below in the visit note. 

## 2016-08-28 NOTE — Patient Instructions (Signed)
Hypertension Hypertension, commonly called high blood pressure, is when the force of blood pumping through your arteries is too strong. Your arteries are the blood vessels that carry blood from your heart throughout your body. A blood pressure reading consists of a higher number over a lower number, such as 110/72. The higher number (systolic) is the pressure inside your arteries when your heart pumps. The lower number (diastolic) is the pressure inside your arteries when your heart relaxes. Ideally you want your blood pressure below 120/80. Hypertension forces your heart to work harder to pump blood. Your arteries may become narrow or stiff. Having untreated or uncontrolled hypertension can cause heart attack, stroke, kidney disease, and other problems. What increases the risk? Some risk factors for high blood pressure are controllable. Others are not. Risk factors you cannot control include:  Race. You may be at higher risk if you are African American.  Age. Risk increases with age.  Gender. Men are at higher risk than women before age 45 years. After age 65, women are at higher risk than men. Risk factors you can control include:  Not getting enough exercise or physical activity.  Being overweight.  Getting too much fat, sugar, calories, or salt in your diet.  Drinking too much alcohol. What are the signs or symptoms? Hypertension does not usually cause signs or symptoms. Extremely high blood pressure (hypertensive crisis) may cause headache, anxiety, shortness of breath, and nosebleed. How is this diagnosed? To check if you have hypertension, your health care provider will measure your blood pressure while you are seated, with your arm held at the level of your heart. It should be measured at least twice using the same arm. Certain conditions can cause a difference in blood pressure between your right and left arms. A blood pressure reading that is higher than normal on one occasion does  not mean that you need treatment. If it is not clear whether you have high blood pressure, you may be asked to return on a different day to have your blood pressure checked again. Or, you may be asked to monitor your blood pressure at home for 1 or more weeks. How is this treated? Treating high blood pressure includes making lifestyle changes and possibly taking medicine. Living a healthy lifestyle can help lower high blood pressure. You may need to change some of your habits. Lifestyle changes may include:  Following the DASH diet. This diet is high in fruits, vegetables, and whole grains. It is low in salt, red meat, and added sugars.  Keep your sodium intake below 2,300 mg per day.  Getting at least 30-45 minutes of aerobic exercise at least 4 times per week.  Losing weight if necessary.  Not smoking.  Limiting alcoholic beverages.  Learning ways to reduce stress. Your health care provider may prescribe medicine if lifestyle changes are not enough to get your blood pressure under control, and if one of the following is true:  You are 18-59 years of age and your systolic blood pressure is above 140.  You are 60 years of age or older, and your systolic blood pressure is above 150.  Your diastolic blood pressure is above 90.  You have diabetes, and your systolic blood pressure is over 140 or your diastolic blood pressure is over 90.  You have kidney disease and your blood pressure is above 140/90.  You have heart disease and your blood pressure is above 140/90. Your personal target blood pressure may vary depending on your medical   conditions, your age, and other factors. Follow these instructions at home:  Have your blood pressure rechecked as directed by your health care provider.  Take medicines only as directed by your health care provider. Follow the directions carefully. Blood pressure medicines must be taken as prescribed. The medicine does not work as well when you skip  doses. Skipping doses also puts you at risk for problems.  Do not smoke.  Monitor your blood pressure at home as directed by your health care provider. Contact a health care provider if:  You think you are having a reaction to medicines taken.  You have recurrent headaches or feel dizzy.  You have swelling in your ankles.  You have trouble with your vision. Get help right away if:  You develop a severe headache or confusion.  You have unusual weakness, numbness, or feel faint.  You have severe chest or abdominal pain.  You vomit repeatedly.  You have trouble breathing. This information is not intended to replace advice given to you by your health care provider. Make sure you discuss any questions you have with your health care provider. Document Released: 07/17/2005 Document Revised: 12/23/2015 Document Reviewed: 05/09/2013 Elsevier Interactive Patient Education  2017 Elsevier Inc.  

## 2016-08-28 NOTE — Assessment & Plan Note (Addendum)
hgba1c acceptable, minimize simple carbs. Increase exercise as tolerated.  

## 2016-08-28 NOTE — Progress Notes (Signed)
Patient ID: Rachael Johnson, female   DOB: October 30, 1950, 66 y.o.   MRN: [name]

## 2016-08-28 NOTE — Assessment & Plan Note (Signed)
Following with hematology.

## 2016-08-28 NOTE — Assessment & Plan Note (Signed)
Well controlled, no changes to meds. Encouraged heart healthy diet such as the DASH diet and exercise as tolerated.  °

## 2016-09-03 NOTE — Assessment & Plan Note (Signed)
Patient feeling much better, CXR ordered to confirm resolution.

## 2016-09-03 NOTE — Assessment & Plan Note (Signed)
Is doing well on current meds. No changes to meds at this time.

## 2016-09-04 ENCOUNTER — Telehealth: Payer: Self-pay | Admitting: Family Medicine

## 2016-09-04 NOTE — Telephone Encounter (Signed)
Requesting:  Hydrocodone Contract  1.18.2016 UDS  Moderate--due Last OV    08/28/2016--next scheduled one is 10/26/2016 Last Refill    #120 no refills  Please Advise

## 2016-09-04 NOTE — Telephone Encounter (Signed)
I gave her a prescription last week I believe. Not sure what to do. When refills needed meed UDS and contract updated

## 2016-09-04 NOTE — Telephone Encounter (Signed)
Patient called stating that she found her prescription, it was in her car. Her dog pushed it down in between the seats.

## 2016-09-04 NOTE — Telephone Encounter (Signed)
Patient called requesting a refill of HYDROcodone-acetaminophen ([place]) 5-325 MG tablet Patient is hoping to pick up this prescription today. Please advise  Phone: [phone]

## 2016-09-04 NOTE — Telephone Encounter (Signed)
OK will let Dr. Charlett Blake know patient found prescription.

## 2016-09-04 NOTE — Telephone Encounter (Signed)
Informed the patient it was filled at her appt. On 08/28/2016.  The patient states she took prescription to pharmacy and left there to fill, but when returned states they said they do not have.  She thinks the pharmacy lost her hardcopy.  I checked with the pharmacy and they never got the hardcopy from her.  This medication was last filled on 06/19/2016 at their location and she checked records and she has not refilled anywhere but at their pharmacy.

## 2016-09-05 ENCOUNTER — Other Ambulatory Visit: Payer: Self-pay | Admitting: Hematology & Oncology

## 2016-09-05 DIAGNOSIS — D473 Essential (hemorrhagic) thrombocythemia: Secondary | ICD-10-CM

## 2016-09-10 ENCOUNTER — Other Ambulatory Visit: Payer: Self-pay | Admitting: Family Medicine

## 2016-09-19 ENCOUNTER — Other Ambulatory Visit: Payer: Self-pay | Admitting: Family Medicine

## 2016-09-21 ENCOUNTER — Other Ambulatory Visit: Payer: Self-pay | Admitting: Family Medicine

## 2016-09-24 ENCOUNTER — Other Ambulatory Visit: Payer: Self-pay | Admitting: Family Medicine

## 2016-10-03 ENCOUNTER — Other Ambulatory Visit: Payer: Self-pay | Admitting: Family Medicine

## 2016-10-03 MED ORDER — PRAMIPEXOLE DIHYDROCHLORIDE 0.25 MG PO TABS: ORAL_TABLET | ORAL | 1 refills | Status: DC

## 2016-10-03 MED ORDER — CITALOPRAM HYDROBROMIDE 40 MG PO TABS: 40.0000 mg | ORAL_TABLET | Freq: Every morning | ORAL | 1 refills | Status: DC

## 2016-10-04 ENCOUNTER — Other Ambulatory Visit: Payer: Self-pay | Admitting: Family Medicine

## 2016-10-05 ENCOUNTER — Other Ambulatory Visit: Payer: Self-pay | Admitting: Family Medicine

## 2016-10-05 DIAGNOSIS — Z1231 Encounter for screening mammogram for malignant neoplasm of breast: Secondary | ICD-10-CM

## 2016-10-06 ENCOUNTER — Other Ambulatory Visit: Payer: Self-pay | Admitting: Family Medicine

## 2016-10-06 MED ORDER — OMEPRAZOLE 40 MG PO CPDR: 40.0000 mg | DELAYED_RELEASE_CAPSULE | Freq: Two times a day (BID) | ORAL | 0 refills | Status: DC

## 2016-10-06 MED ORDER — VITAMIN D (ERGOCALCIFEROL) 1.25 MG (50000 UNIT) PO CAPS: 50000.0000 [IU] | ORAL_CAPSULE | ORAL | 0 refills | Status: DC

## 2016-10-06 MED ORDER — FLUTICASONE PROPIONATE 50 MCG/ACT NA SUSP
2.0000 | Freq: Every day | NASAL | 3 refills | Status: DC
Start: 2016-10-06 — End: 2016-11-07

## 2016-10-06 MED ORDER — PANTOPRAZOLE SODIUM 40 MG PO TBEC: 40.0000 mg | DELAYED_RELEASE_TABLET | Freq: Every day | ORAL | 0 refills | Status: DC

## 2016-10-06 MED ORDER — LEVOTHYROXINE SODIUM 88 MCG PO TABS: 88.0000 ug | ORAL_TABLET | Freq: Every day | ORAL | 0 refills | Status: DC

## 2016-10-17 ENCOUNTER — Telehealth: Payer: Self-pay

## 2016-10-17 ENCOUNTER — Ambulatory Visit (HOSPITAL_BASED_OUTPATIENT_CLINIC_OR_DEPARTMENT_OTHER)
Admission: RE | Admit: 2016-10-17 | Discharge: 2016-10-17 | Disposition: A | Discharge status: Home / Self Care | Payer: BLUE CROSS/BLUE SHIELD | Source: Ambulatory Visit | Attending: Family Medicine | Admitting: Family Medicine

## 2016-10-17 ENCOUNTER — Other Ambulatory Visit (HOSPITAL_BASED_OUTPATIENT_CLINIC_OR_DEPARTMENT_OTHER): Payer: BLUE CROSS/BLUE SHIELD

## 2016-10-17 ENCOUNTER — Encounter: Payer: Self-pay | Admitting: Hematology & Oncology

## 2016-10-17 ENCOUNTER — Ambulatory Visit (HOSPITAL_BASED_OUTPATIENT_CLINIC_OR_DEPARTMENT_OTHER): Payer: BLUE CROSS/BLUE SHIELD | Admitting: Hematology & Oncology

## 2016-10-17 VITALS — BP 136/84 | HR 72 | Temp 97.7°F | Resp 16 | Wt 129.0 lb

## 2016-10-17 DIAGNOSIS — D473 Essential (hemorrhagic) thrombocythemia: Secondary | ICD-10-CM

## 2016-10-17 DIAGNOSIS — D508 Other iron deficiency anemias: Secondary | ICD-10-CM

## 2016-10-17 DIAGNOSIS — I700 Atherosclerosis of aorta: Secondary | ICD-10-CM | POA: Diagnosis not present

## 2016-10-17 DIAGNOSIS — J189 Pneumonia, unspecified organism: Secondary | ICD-10-CM

## 2016-10-17 DIAGNOSIS — R918 Other nonspecific abnormal finding of lung field: Secondary | ICD-10-CM

## 2016-10-17 LAB — COMPREHENSIVE METABOLIC PANEL
ALT: 10 U/L (ref 0–55)
AST: 20 U/L (ref 5–34)
Albumin: 4.2 g/dL (ref 3.5–5.0)
Alkaline Phosphatase: 73 U/L (ref 40–150)
Anion Gap: 7 mEq/L (ref 3–11)
BUN: 13.2 mg/dL (ref 7.0–26.0)
CO2: 28 mEq/L (ref 22–29)
Calcium: 9.6 mg/dL (ref 8.4–10.4)
Chloride: 104 mEq/L (ref 98–109)
Creatinine: 0.8 mg/dL (ref 0.6–1.1)
EGFR: 82 mL/min/{1.73_m2} — ABNORMAL LOW (ref >90)
Glucose: 97 mg/dl (ref 70–140)
Potassium: 4.4 mEq/L (ref 3.5–5.1)
Sodium: 139 mEq/L (ref 136–145)
Total Bilirubin: 0.28 mg/dL (ref 0.20–1.20)
Total Protein: 8 g/dL (ref 6.4–8.3)

## 2016-10-17 LAB — CBC WITH DIFFERENTIAL (CANCER CENTER ONLY)
BASO#: 0 10*3/uL (ref 0.0–0.2)
BASO%: 0.6 % (ref 0.0–2.0)
EOS%: 2.3 % (ref 0.0–7.0)
Eosinophils Absolute: 0.1 10*3/uL (ref 0.0–0.5)
HCT: 36.4 % (ref 34.8–46.6)
HGB: 12.3 g/dL (ref 11.6–15.9)
LYMPH#: 0.7 10*3/uL — ABNORMAL LOW (ref 0.9–3.3)
LYMPH%: 20.7 % (ref 14.0–48.0)
MCH: 42.7 pg — ABNORMAL HIGH (ref 26.0–34.0)
MCHC: 33.8 g/dL (ref 32.0–36.0)
MCV: 126 fL — ABNORMAL HIGH (ref 81–101)
MONO#: 0.2 10*3/uL (ref 0.1–0.9)
MONO%: 5.8 % (ref 0.0–13.0)
NEUT#: 2.5 10*3/uL (ref 1.5–6.5)
NEUT%: 70.6 % (ref 39.6–80.0)
Platelets: 548 10*3/uL — ABNORMAL HIGH (ref 145–400)
RBC: 2.88 10*6/uL — ABNORMAL LOW (ref 3.70–5.32)
RDW: 11.7 % (ref 11.1–15.7)
WBC: 3.5 10*3/uL — ABNORMAL LOW (ref 3.9–10.0)

## 2016-10-17 LAB — IRON AND TIBC
%SAT: 40 % (ref 21–57)
Iron: 88 ug/dL (ref 41–142)
TIBC: 220 ug/dL — ABNORMAL LOW (ref 236–444)
UIBC: 132 ug/dL (ref 120–384)

## 2016-10-17 LAB — LACTATE DEHYDROGENASE: LDH: 209 U/L (ref 125–245)

## 2016-10-17 LAB — FERRITIN: Ferritin: 371 ng/ml — ABNORMAL HIGH (ref 9–269)

## 2016-10-17 NOTE — Telephone Encounter (Signed)
Per Dr. Charlett Blake, Non-Contrast  Chest Ct needed per [facility]  Imaging if ok with patient. Patient notified states it is ok for CT to be ordered. Order place per Dr. Charlett Blake.Marland Kitchen

## 2016-10-17 NOTE — Telephone Encounter (Addendum)
Today: Per phone call from [facility] regarding Chest XR, Patchy opacity in portions of the right middle lobe and inferior lingula persists. Non-contrast Chest CT to further access recommended.

## 2016-10-17 NOTE — Progress Notes (Signed)
Hematology and Oncology Follow Up Visit  Rachael Johnson [id number] 1950-12-23 66 y.o. 10/17/2016   Principle Diagnosis:  Essential thrombocythemia - JAK2 negative. 2. Intermittent iron-deficiency anemia.  Current Therapy:   Hydrea  1000 mg by mouth daily Aspirin 81 mg by mouth daily IV iron as indicated - last dose given on 02/21/2069      Interim History:  Rachael Johnson is back for followup.  She looks quite good. She feels okay although she knows that her platelet count is up as she is having more headaches. She is taking baby aspirin daily.  She has had no problems with bleeding. There's been no abdominal pain. She's had no change in bowel or bladder habits.  She's had no visual issues.  She's had no cough. She did have a bout of pneumonia last year. She's not had any so far this year.   Back in December, her iron studies looked okay. Her ferritin was 321 with iron saturation 48%.   Overall, her performance status is ECOG 1.   Medications:  Current Outpatient Prescriptions:  .  albuterol (VENTOLIN HFA) 108 (90 Base) MCG/ACT inhaler, INHALE 2 PUFFS INTO THE LUNGS EVERY 6 (SIX) HOURS AS  NEEDED FOR WHEEZI NG OR SHORTNESS OF BREATH., Disp: 18 g, Rfl: 8 .  ALPRAZolam (XANAX) 1 MG tablet, Take 1 tablet (1 mg total) by mouth 3 (three) times daily as needed for anxiety., Disp: 180 tablet, Rfl: 1 .  amitriptyline (ELAVIL) 10 MG tablet, Take 2 tablets (20 mg total) by mouth at bedtime., Disp: 90 tablet, Rfl: 1 .  amitriptyline (ELAVIL) 10 MG tablet, TAKE 2 TABLETS (20 MG TOTAL) BY MOUTH AT BEDTIME., Disp: 60 tablet, Rfl: 0 .  aspirin EC 81 MG tablet, Take 81 mg by mouth every morning., Disp: , Rfl:  .  benzonatate (TESSALON) 200 MG capsule, TAKE 1 CAPSULE (200 MG TOTAL) BY MOUTH AT BEDTIME AS NEEDED FOR COUGH., Disp: 90 capsule, Rfl: 1 .  budesonide-formoterol (SYMBICORT) 160-4.5 MCG/ACT inhaler, Inhale 2 puffs into the lungs 2 (two) times daily., Disp: 10.2 g, Rfl: 1 .  Calcium  Carbonate-Vitamin D (CALTRATE COLON HEALTH PO), Take by mouth., Disp: , Rfl:  .  cetirizine (ZYRTEC) 10 MG tablet, Take 1 tablet (10 mg total) by mouth daily., Disp: 90 tablet, Rfl: 1 .  citalopram (CELEXA) 40 MG tablet, Take 1 tablet (40 mg total) by mouth every morning., Disp: 90 tablet, Rfl: 1 .  Cranberry 300 MG tablet, Take 300 mg by mouth 2 (two) times daily., Disp: , Rfl:  .  cyclobenzaprine (FLEXERIL) 10 MG tablet, TAKE 1 TABLET (10 MG TOTAL) BY MOUTH 2 (TWO) TIMES DAILY AS NEEDED FOR MUSCLE SPASMS., Disp: 60 tablet, Rfl: 3 .  estradiol (ESTRACE) 0.5 MG tablet, Take 1 tablet (0.5 mg total) by mouth 2 (two) times daily., Disp: 180 tablet, Rfl: 1 .  fluorometholone (FML) 0.1 % ophthalmic suspension, Place 1 drop into both eyes daily as needed (For dry eyes.). , Disp: , Rfl:  .  fluticasone (FLONASE) 50 MCG/ACT nasal spray, Place 2 sprays into both nostrils daily., Disp: 48 g, Rfl: 3 .  folic acid (FOLVITE) 1 MG tablet, Take 1 mg by mouth daily., Disp: , Rfl:  .  furosemide (LASIX) 20 MG tablet, Take 1 tablet (20 mg total) by mouth 2 (two) times daily as needed for fluid or edema., Disp: 90 tablet, Rfl: 1 .  HYDROcodone-acetaminophen (NORCO) 5-325 MG tablet, Take 1 tablet by mouth every 6 (six) hours  as needed for severe pain. Take one tablet by mouth every 6 hours as needed for severe pain, Disp: 120 tablet, Rfl: 0 .  hydroxyurea (HYDREA) 500 MG capsule, TAKE 2 CAPSULES BY MOUTH DAILY, Disp: 60 capsule, Rfl: 2 .  lactulose (CHRONULAC) 10 GM/15ML solution, TAKE 2 TABLESPOONFULS (30 ML) BY MOUTH 3 TIMES A DAY FOR CONSTIPATION, Disp: 1000 mL, Rfl: 8 .  levothyroxine (SYNTHROID, LEVOTHROID) 88 MCG tablet, Take 1 tablet (88 mcg total) by mouth daily., Disp: 30 tablet, Rfl: 5 .  levothyroxine (SYNTHROID, LEVOTHROID) 88 MCG tablet, Take 1 tablet (88 mcg total) by mouth daily., Disp: 90 tablet, Rfl: 0 .  meclizine (ANTIVERT) 12.5 MG tablet, TAKE 1 TABLET THREE TIMES A DAY AS NEEDED FOR NAUSEA OR  DIZZINESS, Disp: 30 tablet, Rfl: 1 .  montelukast (SINGULAIR) 10 MG tablet, Take 1 tablet (10 mg total) by mouth at bedtime., Disp: 30 tablet, Rfl: 5 .  Multiple Vitamin (MULTIVITAMIN PO), Take 1 tablet by mouth every morning. , Disp: , Rfl:  .  naproxen (NAPROSYN) 500 MG tablet, TAKE 1 TABLET (500 MG TOTAL) BY MOUTH 2 (TWO) TIMES DAILY WITH A MEAL., Disp: 180 tablet, Rfl: 1 .  Omega-3 Fatty Acids (FISH OIL) 1000 MG CAPS, Take by mouth 2 (two) times daily., Disp: , Rfl:  .  omeprazole (PRILOSEC) 40 MG capsule, Take 1 capsule (40 mg total) by mouth 2 (two) times daily., Disp: 180 capsule, Rfl: 0 .  ondansetron (ZOFRAN) 8 MG tablet, TAKE ONE TABLET BY MOUTH EVERY 8 HOURS AS NEEDED NAUSEA AND VOMITING, Disp: 20 tablet, Rfl: 0 .  pantoprazole (PROTONIX) 40 MG tablet, Take 1 tablet (40 mg total) by mouth daily., Disp: 90 tablet, Rfl: 0 .  pramipexole (MIRAPEX) 0.25 MG tablet, TAKE ONE OR TWO TABLETS BY MOUTH AT BEDTIME AS NEEDED, Disp: 60 tablet, Rfl: 1 .  Probiotic Product (PROBIOTIC DAILY PO), Take by mouth 2 (two) times daily., Disp: , Rfl:  .  promethazine (PHENERGAN) 25 MG tablet, Take 1 tablet (25 mg total) by mouth every 6 (six) hours as needed for nausea or vomiting., Disp: 60 tablet, Rfl: 4 .  ranitidine (ZANTAC) 300 MG tablet, Take 1 tablet (300 mg total) by mouth at bedtime., Disp: 90 tablet, Rfl: 1 .  RESTASIS 0.05 % ophthalmic emulsion, Place 1 drop into both eyes 2 (two) times daily. , Disp: , Rfl: 3 .  venlafaxine XR (EFFEXOR-XR) 150 MG 24 hr capsule, Take 1 capsule (150 mg total) by mouth daily with breakfast., Disp: 90 capsule, Rfl: 1 .  venlafaxine XR (EFFEXOR-XR) 75 MG 24 hr capsule, TAKE ONE CAPSULE BY MOUTH DAILY WITH BREAKFAST, Disp: 90 capsule, Rfl: 1 .  VENTOLIN HFA 108 (90 Base) MCG/ACT inhaler, INHALE 2 PUFFS INTO THE LUNGS EVERY 6 (SIX) HOURS AS NEEDED FOR WHEEZING OR SHORTNESS OF BREATH., Disp: 18 Inhaler, Rfl: 8 .  Vitamin D, Ergocalciferol, (DRISDOL) 50000 units CAPS capsule,  Take 1 capsule (50,000 Units total) by mouth every 7 (seven) days., Disp: 12 capsule, Rfl: 0  Allergies: No Known Allergies  Past Medical History, Surgical history, Social history, and Family History were reviewed and updated.  Review of Systems: As above  Physical Exam:  weight is 129 lb (58.5 kg). Her oral temperature is 97.7 F (36.5 C). Her blood pressure is 136/84 and her pulse is 72. Her respiration is 16 and oxygen saturation is 100%.   Thin white female. Head and neck exam shows no ocular or oral lesions. She has some slight  temporal muscle wasting. There is no adenopathy in her neck. Lungs are clear on the right side. There might be some slight decrease on the left side. No wheezes or crackles are noted bilaterally. Cardiac exam regular rate and rhythm. No murmurs. Abdomen is soft. She has good bowel sounds. There is no fluid wave. There is no palpable liver or spleen. Back exam shows no tenderness over the spine ribs or hips. Extremities shows mild chronic nonpitting edema. she has good range of motion of her joints. She has a compression sleeve on her right arm. There is no swelling of the right arm. There is no cyanosis of her lower extremities. She has good pulses. . Skin exam no rashes. Neurological exam no focal deficits.  Lab Results  Component Value Date   WBC 3.5 (L) 10/17/2016   HGB 12.3 10/17/2016   HCT 36.4 10/17/2016   MCV 126 (H) 10/17/2016   PLT 548 (H) 10/17/2016     Chemistry      Component Value Date/Time   NA 139 07/18/2016 0946   K 3.9 07/18/2016 0946   CL 103 06/19/2016 1221   CL 98 05/15/2016 0930   CO2 29 07/18/2016 0946   BUN 14.0 07/18/2016 0946   CREATININE 0.7 07/18/2016 0946      Component Value Date/Time   CALCIUM 9.3 07/18/2016 0946   ALKPHOS 67 07/18/2016 0946   AST 16 07/18/2016 0946   ALT 10 07/18/2016 0946   BILITOT 0.22 07/18/2016 0946         Impression and Plan: Rachael Johnson is 66 year old white female with essential  thrombocythemia.  I will have to increase her Hydrea back up to 1000 mg a day. There is a "trade-off" with respect to her thrombocytosis and anemia. I much rather have her slightly anemic then have her plate account on the higher side.  I will get her back in 6 weeks.    [name], MD 3/20/201810:10 AM

## 2016-10-18 ENCOUNTER — Telehealth: Payer: Self-pay | Admitting: *Deleted

## 2016-10-18 NOTE — Telephone Encounter (Addendum)
Patient aware of results  ----- Message from Eliezer Bottom, NP sent at 10/17/2016  2:38 PM EDT ----- Regarding: iron  Iron studies look good. No infusion needed at this time. Thank you!  Sarah  ----- Message ----- From: Interface, Lab In Three Zero One Sent: 10/17/2016  10:03 AM To: Eliezer Bottom, NP

## 2016-10-21 ENCOUNTER — Other Ambulatory Visit: Payer: Self-pay | Admitting: Family Medicine

## 2016-10-21 ENCOUNTER — Ambulatory Visit (HOSPITAL_BASED_OUTPATIENT_CLINIC_OR_DEPARTMENT_OTHER)
Admission: RE | Admit: 2016-10-21 | Discharge: 2016-10-21 | Disposition: A | Discharge status: Home / Self Care | Payer: BLUE CROSS/BLUE SHIELD | Source: Ambulatory Visit | Attending: Family Medicine | Admitting: Family Medicine

## 2016-10-21 DIAGNOSIS — R918 Other nonspecific abnormal finding of lung field: Secondary | ICD-10-CM

## 2016-10-21 DIAGNOSIS — J470 Bronchiectasis with acute lower respiratory infection: Secondary | ICD-10-CM | POA: Insufficient documentation

## 2016-10-23 ENCOUNTER — Other Ambulatory Visit: Payer: Self-pay | Admitting: Family Medicine

## 2016-10-23 DIAGNOSIS — J189 Pneumonia, unspecified organism: Secondary | ICD-10-CM

## 2016-10-23 DIAGNOSIS — R918 Other nonspecific abnormal finding of lung field: Secondary | ICD-10-CM

## 2016-10-23 DIAGNOSIS — J449 Chronic obstructive pulmonary disease, unspecified: Secondary | ICD-10-CM

## 2016-10-23 DIAGNOSIS — R059 Cough, unspecified: Secondary | ICD-10-CM

## 2016-10-23 DIAGNOSIS — R05 Cough: Secondary | ICD-10-CM

## 2016-10-26 ENCOUNTER — Ambulatory Visit (INDEPENDENT_AMBULATORY_CARE_PROVIDER_SITE_OTHER): Payer: BLUE CROSS/BLUE SHIELD | Admitting: Family Medicine

## 2016-10-26 ENCOUNTER — Encounter: Payer: Self-pay | Admitting: Family Medicine

## 2016-10-26 VITALS — BP 114/75 | HR 82 | Temp 97.8°F | Wt 135.2 lb

## 2016-10-26 DIAGNOSIS — D539 Nutritional anemia, unspecified: Secondary | ICD-10-CM | POA: Diagnosis not present

## 2016-10-26 DIAGNOSIS — E039 Hypothyroidism, unspecified: Secondary | ICD-10-CM

## 2016-10-26 DIAGNOSIS — R634 Abnormal weight loss: Secondary | ICD-10-CM

## 2016-10-26 DIAGNOSIS — R739 Hyperglycemia, unspecified: Secondary | ICD-10-CM

## 2016-10-26 DIAGNOSIS — I10 Essential (primary) hypertension: Secondary | ICD-10-CM | POA: Diagnosis not present

## 2016-10-26 DIAGNOSIS — R300 Dysuria: Secondary | ICD-10-CM

## 2016-10-26 DIAGNOSIS — J189 Pneumonia, unspecified organism: Secondary | ICD-10-CM

## 2016-10-26 DIAGNOSIS — E559 Vitamin D deficiency, unspecified: Secondary | ICD-10-CM | POA: Diagnosis not present

## 2016-10-26 DIAGNOSIS — R918 Other nonspecific abnormal finding of lung field: Secondary | ICD-10-CM

## 2016-10-26 DIAGNOSIS — E785 Hyperlipidemia, unspecified: Secondary | ICD-10-CM

## 2016-10-26 MED ORDER — HYDROCODONE-ACETAMINOPHEN 5-325 MG PO TABS: 1.0000 | ORAL_TABLET | Freq: Four times a day (QID) | ORAL | 0 refills | Status: DC | PRN

## 2016-10-26 NOTE — Patient Instructions (Signed)
Hypertension °Hypertension is another name for high blood pressure. High blood pressure forces your heart to work harder to pump blood. This can cause problems over time. °There are two numbers in a blood pressure reading. There is a top number (systolic) over a bottom number (diastolic). It is best to have a blood pressure below 120/80. Healthy choices can help lower your blood pressure. You may need medicine to help lower your blood pressure if: °· Your blood pressure cannot be lowered with healthy choices. °· Your blood pressure is higher than 130/80. °Follow these instructions at home: °Eating and drinking  °· If directed, follow the DASH eating plan. This diet includes: °¨ Filling half of your plate at each meal with fruits and vegetables. °¨ Filling one quarter of your plate at each meal with whole grains. Whole grains include whole wheat pasta, brown rice, and whole grain bread. °¨ Eating or drinking low-fat dairy products, such as skim milk or low-fat yogurt. °¨ Filling one quarter of your plate at each meal with low-fat (lean) proteins. Low-fat proteins include fish, skinless chicken, eggs, beans, and tofu. °¨ Avoiding fatty meat, cured and processed meat, or chicken with skin. °¨ Avoiding premade or processed food. °· Eat less than 1,500 mg of salt (sodium) a day. °· Limit alcohol use to no more than 1 drink a day for nonpregnant women and 2 drinks a day for men. One drink equals 12 oz of beer, 5 oz of wine, or 1½ oz of hard liquor. °Lifestyle  °· Work with your doctor to stay at a healthy weight or to lose weight. Ask your doctor what the best weight is for you. °· Get at least 30 minutes of exercise that causes your heart to beat faster (aerobic exercise) most days of the week. This may include walking, swimming, or biking. °· Get at least 30 minutes of exercise that strengthens your muscles (resistance exercise) at least 3 days a week. This may include lifting weights or pilates. °· Do not use any  products that contain nicotine or tobacco. This includes cigarettes and e-cigarettes. If you need help quitting, ask your doctor. °· Check your blood pressure at home as told by your doctor. °· Keep all follow-up visits as told by your doctor. This is important. °Medicines  °· Take over-the-counter and prescription medicines only as told by your doctor. Follow directions carefully. °· Do not skip doses of blood pressure medicine. The medicine does not work as well if you skip doses. Skipping doses also puts you at risk for problems. °· Ask your doctor about side effects or reactions to medicines that you should watch for. °Contact a doctor if: °· You think you are having a reaction to the medicine you are taking. °· You have headaches that keep coming back (recurring). °· You feel dizzy. °· You have swelling in your ankles. °· You have trouble with your vision. °Get help right away if: °· You get a very bad headache. °· You start to feel confused. °· You feel weak or numb. °· You feel faint. °· You get very bad pain in your: °¨ Chest. °¨ Belly (abdomen). °· You throw up (vomit) more than once. °· You have trouble breathing. °Summary °· Hypertension is another name for high blood pressure. °· Making healthy choices can help lower blood pressure. If your blood pressure cannot be controlled with healthy choices, you may need to take medicine. °This information is not intended to replace advice given to you by your   health care provider. Make sure you discuss any questions you have with your health care provider. °Document Released: 01/03/2008 Document Revised: 06/14/2016 Document Reviewed: 06/14/2016 °[employer] Interactive Patient Education © 2017 [employer] ° °

## 2016-10-26 NOTE — Progress Notes (Signed)
Patient ID: Rachael Johnson, female   DOB: May 08, 1951, 66 y.o.   MRN: [id number]   Subjective:  CMA scribed for Dr Charlett Blake  Patient ID: Rachael Johnson, female    DOB: 03-14-1951, 66 y.o.   MRN: [id number]  Chief Complaint  Patient presents with  . Follow-up    Pneumonia.    Hypertension  Associated symptoms include malaise/fatigue and shortness of breath. Pertinent negatives include no blurred vision, chest pain, headaches or palpitations.  Hyperlipidemia  Associated symptoms include shortness of breath. Pertinent negatives include no chest pain.    Patient is in today for a F/U on pneumonia. Patient states that she has completed all antibiotcs, feels a little drained, with overall feels ok. Patient also stated that she is to see Dr. Melvyn Novas next week due to a mass found on the lower lobe of her right lung from the result of a CT Scan. Patient has a Hx of hyperlipidemia, HTN, Vitamin D Deficiency, CAP. Patient has no additional acute concerns noted at this time. She notes fatigue, anxiety and depression persist but she is eating somewhat better. No fevers or chills but she does endorse congestion and cough. Denies CP/palp/HA/fevers/GI or GU c/o. Taking meds as prescribed  Patient Care Team: Mosie Lukes, MD as PCP - General (Family Medicine) [name], MD as Consulting Physician (Oncology) Glenna Fellows, MD as Attending Physician (Neurosurgery)   Past Medical History:  Diagnosis Date  . Acute pharyngitis 07/13/2014  . Allergy   . Anemia, iron deficiency 07/08/2012  . Arthritis   . Cataract 12/21/2014  . Cerumen impaction 04/28/2013  . Chest pain   . Depression   . Depression with anxiety 03/24/2011  . Diverticulitis   . Esophageal reflux 08/17/2013  . Essential thrombocythemia ([place]) 01/24/2011  . Frequent episodic tension-type headache   . Gastroenteritis 12/21/2014  . 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 GERD (gastroesophageal reflux disease)   . H. pylori infection 12/19/2012  . Hyperglycemia  12/17/2015  . Hyperglycemia 12/17/2015  . Hypertension   . Normal cardiac stress test   . Other and unspecified hyperlipidemia 02/25/2013  . Pedal edema 03/07/2016  . Pneumonia 04/29/2016  . Preventative health care 04/20/2013  . Restless leg syndrome 09/30/2014  . Scabies 03/28/2015  . Seizure ([place])    childhood  . Thyroid disease     Past Surgical History:  Procedure Laterality Date  . ABDOMINAL HYSTERECTOMY     1992  . BREAST SURGERY  1992   biopsy, benign. Fibrocystic  . UMBILICAL HERNIA REPAIR N/A 09/25/2012   Procedure: HERNIA REPAIR UMBILICAL ADULT;  Surgeon: Harl Bowie, MD;  Location: [facility];  Service: General;  Laterality: N/A;    Family History  Problem Relation Age of Onset  . Arthritis Mother   . Cancer Mother     ovarian  . Hypertension Mother   . Heart disease Mother     pacer  . Heart failure Mother   . Arthritis Father   . Cancer Father     lung  . Hypertension Father   . Cancer Sister     lung  . Cancer Brother     prostate  . Cancer Brother     lung  . Hypertension Son   . COPD Brother   . Heart disease Brother   . Hypertension Brother   . Cancer Brother     colon  . Alcohol abuse Brother   . Cirrhosis Brother   . Seizures Sister   . Stroke  Sister   . Arthritis Brother     Social History   Social History  . Marital status: Widowed    Spouse name: N/A  . Number of children: 2  . Years of education: N/A   Occupational History  . APL operator [employer]   Social History Main Topics  . Smoking status: Former Smoker    Packs/day: 3.50    Years: 20.00    Types: Cigarettes    Start date: 11/15/1970    Quit date: 07/31/1990  . Smokeless tobacco: Never Used     Comment: quit 23 years ago  . Alcohol use No  . Drug use: No  . Sexual activity: No   Other Topics Concern  . Not on file   Social History Narrative   Regular exercise: no   Caffeine Use: 2-3 weekly    Outpatient Medications Prior to Visit  Medication  Sig Dispense Refill  . albuterol (VENTOLIN HFA) 108 (90 Base) MCG/ACT inhaler INHALE 2 PUFFS INTO THE LUNGS EVERY 6 (SIX) HOURS AS  NEEDED FOR WHEEZI NG OR SHORTNESS OF BREATH. 18 g 8  . ALPRAZolam (XANAX) 1 MG tablet Take 1 tablet (1 mg total) by mouth 3 (three) times daily as needed for anxiety. 180 tablet 1  . amitriptyline (ELAVIL) 10 MG tablet Take 2 tablets (20 mg total) by mouth at bedtime. 90 tablet 1  . aspirin EC 81 MG tablet Take 81 mg by mouth every morning.    . benzonatate (TESSALON) 200 MG capsule TAKE 1 CAPSULE (200 MG TOTAL) BY MOUTH AT BEDTIME AS NEEDED FOR COUGH. 90 capsule 1  . budesonide-formoterol (SYMBICORT) 160-4.5 MCG/ACT inhaler Inhale 2 puffs into the lungs 2 (two) times daily. 10.2 g 1  . Calcium Carbonate-Vitamin D (CALTRATE COLON HEALTH PO) Take by mouth.    . cetirizine (ZYRTEC) 10 MG tablet Take 1 tablet (10 mg total) by mouth daily. 90 tablet 1  . citalopram (CELEXA) 40 MG tablet Take 1 tablet (40 mg total) by mouth every morning. 90 tablet 1  . Cranberry 300 MG tablet Take 300 mg by mouth 2 (two) times daily.    . cyclobenzaprine (FLEXERIL) 10 MG tablet TAKE 1 TABLET (10 MG TOTAL) BY MOUTH 2 (TWO) TIMES DAILY AS NEEDED FOR MUSCLE SPASMS. 60 tablet 3  . estradiol (ESTRACE) 0.5 MG tablet Take 1 tablet (0.5 mg total) by mouth 2 (two) times daily. 180 tablet 1  . fluorometholone (FML) 0.1 % ophthalmic suspension Place 1 drop into both eyes daily as needed (For dry eyes.).     Marland Kitchen fluticasone (FLONASE) 50 MCG/ACT nasal spray Place 2 sprays into both nostrils daily. 48 g 3  . folic acid (FOLVITE) 1 MG tablet Take 1 mg by mouth daily.    . furosemide (LASIX) 20 MG tablet Take 1 tablet (20 mg total) by mouth 2 (two) times daily as needed for fluid or edema. 90 tablet 1  . hydroxyurea (HYDREA) 500 MG capsule TAKE 2 CAPSULES BY MOUTH DAILY 60 capsule 2  . lactulose (CHRONULAC) 10 GM/15ML solution TAKE 2 TABLESPOONFULS (30 ML) BY MOUTH 3 TIMES A DAY FOR CONSTIPATION 1000 mL 8    . levothyroxine (SYNTHROID, LEVOTHROID) 88 MCG tablet Take 1 tablet (88 mcg total) by mouth daily. 30 tablet 5  . levothyroxine (SYNTHROID, LEVOTHROID) 88 MCG tablet Take 1 tablet (88 mcg total) by mouth daily. 90 tablet 0  . meclizine (ANTIVERT) 12.5 MG tablet TAKE 1 TABLET THREE TIMES A DAY  AS NEEDED FOR NAUSEA OR DIZZINESS 30 tablet 1  . montelukast (SINGULAIR) 10 MG tablet Take 1 tablet (10 mg total) by mouth at bedtime. 30 tablet 5  . Multiple Vitamin (MULTIVITAMIN PO) Take 1 tablet by mouth every morning.     . naproxen (NAPROSYN) 500 MG tablet TAKE 1 TABLET (500 MG TOTAL) BY MOUTH 2 (TWO) TIMES DAILY WITH A MEAL. 180 tablet 1  . Omega-3 Fatty Acids (FISH OIL) 1000 MG CAPS Take by mouth 2 (two) times daily.    Marland Kitchen omeprazole (PRILOSEC) 40 MG capsule Take 1 capsule (40 mg total) by mouth 2 (two) times daily. 180 capsule 0  . ondansetron (ZOFRAN) 8 MG tablet TAKE ONE TABLET BY MOUTH EVERY 8 HOURS AS NEEDED NAUSEA AND VOMITING 20 tablet 0  . pantoprazole (PROTONIX) 40 MG tablet Take 1 tablet (40 mg total) by mouth daily. 90 tablet 0  . pramipexole (MIRAPEX) 0.25 MG tablet TAKE ONE OR TWO TABLETS BY MOUTH AT BEDTIME AS NEEDED 60 tablet 1  . Probiotic Product (PROBIOTIC DAILY PO) Take by mouth 2 (two) times daily.    . promethazine (PHENERGAN) 25 MG tablet Take 1 tablet (25 mg total) by mouth every 6 (six) hours as needed for nausea or vomiting. 60 tablet 4  . ranitidine (ZANTAC) 300 MG tablet Take 1 tablet (300 mg total) by mouth at bedtime. 90 tablet 1  . RESTASIS 0.05 % ophthalmic emulsion Place 1 drop into both eyes 2 (two) times daily.   3  . venlafaxine XR (EFFEXOR-XR) 150 MG 24 hr capsule Take 1 capsule (150 mg total) by mouth daily with breakfast. 90 capsule 1  . venlafaxine XR (EFFEXOR-XR) 75 MG 24 hr capsule TAKE ONE CAPSULE BY MOUTH DAILY WITH BREAKFAST 90 capsule 1  . VENTOLIN HFA 108 (90 Base) MCG/ACT inhaler INHALE 2 PUFFS INTO THE LUNGS EVERY 6 (SIX) HOURS AS NEEDED FOR WHEEZING OR  SHORTNESS OF BREATH. 18 Inhaler 8  . Vitamin D, Ergocalciferol, (DRISDOL) 50000 units CAPS capsule Take 1 capsule (50,000 Units total) by mouth every 7 (seven) days. 12 capsule 0  . HYDROcodone-acetaminophen (NORCO) 5-325 MG tablet Take 1 tablet by mouth every 6 (six) hours as needed for severe pain. Take one tablet by mouth every 6 hours as needed for severe pain 120 tablet 0  . amitriptyline (ELAVIL) 10 MG tablet TAKE 2 TABLETS (20 MG TOTAL) BY MOUTH AT BEDTIME. (Patient not taking: Reported on 10/26/2016) 60 tablet 3   No facility-administered medications prior to visit.     No Known Allergies  Review of Systems  Constitutional: Positive for malaise/fatigue. Negative for fever.  HENT: Positive for congestion.   Eyes: Negative for blurred vision.  Respiratory: Positive for cough and shortness of breath.   Cardiovascular: Negative for chest pain, palpitations and leg swelling.  Gastrointestinal: Negative for vomiting.  Musculoskeletal: Negative for back pain.  Skin: Negative for rash.  Neurological: Negative for loss of consciousness and headaches.  Psychiatric/Behavioral: Positive for depression. The patient is nervous/anxious.        Objective:    Physical Exam  Constitutional: She is oriented to person, place, and time. She appears well-developed and well-nourished. No distress.  HENT:  Head: Normocephalic and atraumatic.  Eyes: Conjunctivae are normal.  Neck: Normal range of motion. No thyromegaly present.  Cardiovascular: Normal rate and regular rhythm.   Pulmonary/Chest: Effort normal and breath sounds normal. No respiratory distress. She has no wheezes.  Prolonged expiration.  Abdominal: Soft. Bowel sounds are normal. There  is no tenderness.  Musculoskeletal: She exhibits no edema or deformity.  Neurological: She is alert and oriented to person, place, and time.  Skin: Skin is warm and dry. She is not diaphoretic.  Psychiatric: She has a normal mood and affect.    BP  114/75 (BP Location: Left Arm, Patient Position: Sitting, Cuff Size: Normal)   Pulse 82   Temp 97.8 F (36.6 C) (Oral)   Wt 135 lb 3.2 oz (61.3 kg)   LMP 07/31/1990   SpO2 100% Comment: RA  BMI 23.21 kg/m  Wt Readings from Last 3 Encounters:  10/26/16 135 lb 3.2 oz (61.3 kg)  10/17/16 129 lb (58.5 kg)  08/28/16 132 lb 3.2 oz (60 kg)   BP Readings from Last 3 Encounters:  10/26/16 114/75  10/17/16 136/84  08/28/16 130/70     Immunization History  Administered Date(s) Administered  . Influenza Whole 05/16/2013  . Influenza,inj,Quad PF,36+ Mos 04/02/2015, 04/18/2016  . Influenza-Unspecified 05/31/2014  . Pneumococcal Conjugate-13 04/04/2013  . Pneumococcal Polysaccharide-23 04/18/2016  . Pneumococcal-Unspecified 04/10/2011  . Td 08/01/2007  . Tdap 07/06/2014  . Zoster 04/28/2013    Health Maintenance  Topic Date Due  . Hepatitis C Screening  October 19, 1950  . HIV Screening  01/31/1967  . PAP SMEAR  10/30/2014  . INFLUENZA VACCINE  02/28/2017  . MAMMOGRAM  11/01/2017  . COLONOSCOPY  11/28/2020  . TETANUS/TDAP  07/06/2024    Lab Results  Component Value Date   WBC 3.5 (L) 10/17/2016   HGB 12.3 10/17/2016   HCT 36.4 10/17/2016   PLT 548 (H) 10/17/2016   GLUCOSE 97 10/17/2016   CHOL 176 06/19/2016   TRIG 70.0 06/19/2016   HDL 69.80 06/19/2016   LDLCALC 92 06/19/2016   ALT 10 10/17/2016   AST 20 10/17/2016   NA 139 10/17/2016   K 4.4 10/17/2016   CL 103 06/19/2016   CREATININE 0.8 10/17/2016   BUN 13.2 10/17/2016   CO2 28 10/17/2016   TSH 2.05 06/19/2016   HGBA1C 5.4 06/19/2016    Lab Results  Component Value Date   TSH 2.05 06/19/2016   Lab Results  Component Value Date   WBC 3.5 (L) 10/17/2016   HGB 12.3 10/17/2016   HCT 36.4 10/17/2016   MCV 126 (H) 10/17/2016   PLT 548 (H) 10/17/2016   Lab Results  Component Value Date   NA 139 10/17/2016   K 4.4 10/17/2016   CHLORIDE 104 10/17/2016   CO2 28 10/17/2016   GLUCOSE 97 10/17/2016   BUN 13.2  10/17/2016   CREATININE 0.8 10/17/2016   BILITOT 0.28 10/17/2016   ALKPHOS 73 10/17/2016   AST 20 10/17/2016   ALT 10 10/17/2016   PROT 8.0 10/17/2016   ALBUMIN 4.2 10/17/2016   CALCIUM 9.6 10/17/2016   ANIONGAP 7 10/17/2016   EGFR 82 (L) 10/17/2016   GFR 89.43 06/19/2016   Lab Results  Component Value Date   CHOL 176 06/19/2016   Lab Results  Component Value Date   HDL 69.80 06/19/2016   Lab Results  Component Value Date   LDLCALC 92 06/19/2016   Lab Results  Component Value Date   TRIG 70.0 06/19/2016   Lab Results  Component Value Date   CHOLHDL 3 06/19/2016   Lab Results  Component Value Date   HGBA1C 5.4 06/19/2016         Assessment & Plan:   Problem List Items Addressed This Visit    Hypothyroid    On Levothyroxine, continue to  monitor      Weight loss    Has gained a couple of pounds since last visit by eating more proteins.       Macrocytic anemia    Follows with hematology      Hyperlipidemia    Encouraged heart healthy diet, increase exercise, avoid trans fats, consider a krill oil cap daily      Hyperglycemia     minimize simple carbs. Increase exercise as tolerated.       Relevant Medications   HYDROcodone-acetaminophen (NORCO) 5-325 MG tablet   Vitamin D deficiency    Continue daily supplements      Relevant Medications   HYDROcodone-acetaminophen (NORCO) 5-325 MG tablet   Dysuria   Relevant Medications   HYDROcodone-acetaminophen (NORCO) 5-325 MG tablet   Essential hypertension    Well controlled, no changes to meds. Encouraged heart healthy diet such as the DASH diet and exercise as tolerated.       Relevant Medications   HYDROcodone-acetaminophen (NORCO) 5-325 MG tablet   CAP (community acquired pneumonia)    Despite several rounds of antibiotics her CXR still concerning for inflammatory and possibly infectious findings. She struggles chronically with SOB and congestion. Due to CT findings and symptoms, repeat CT scan  has been ordered and she has been referred to pulmonology for further consideration.         Other Visit Diagnoses    Mass of right lung    -  Primary   Relevant Orders   CT Chest Wo Contrast      I am having Ms. Skoczylas maintain her Multiple Vitamin (MULTIVITAMIN PO), fluorometholone, aspirin EC, Fish Oil, Probiotic Product (PROBIOTIC DAILY PO), folic acid, Calcium Carbonate-Vitamin D (CALTRATE COLON HEALTH PO), Cranberry, RESTASIS, ondansetron, promethazine, levothyroxine, montelukast, lactulose, cetirizine, ALPRAZolam, amitriptyline, budesonide-formoterol, estradiol, furosemide, meclizine, naproxen, ranitidine, venlafaxine XR, venlafaxine XR, albuterol, hydroxyurea, VENTOLIN HFA, cyclobenzaprine, benzonatate, citalopram, pramipexole, Vitamin D (Ergocalciferol), pantoprazole, omeprazole, levothyroxine, fluticasone, and HYDROcodone-acetaminophen.  Meds ordered this encounter  Medications  . HYDROcodone-acetaminophen (NORCO) 5-325 MG tablet    Sig: Take 1 tablet by mouth every 6 (six) hours as needed for severe pain. Take one tablet by mouth every 6 hours as needed for severe pain    Dispense:  120 tablet    Refill:  0    CMA served as scribe during this visit. History, Physical and Plan performed by medical provider. Documentation and orders reviewed and attested to.  Penni Homans, MD

## 2016-10-26 NOTE — Progress Notes (Signed)
Pre visit review using our clinic review tool, if applicable. No additional management support is needed unless otherwise documented below in the visit note. 

## 2016-10-29 NOTE — Assessment & Plan Note (Signed)
On Levothyroxine, continue to monitor 

## 2016-10-29 NOTE — Assessment & Plan Note (Signed)
Continue daily supplements 

## 2016-10-29 NOTE — Assessment & Plan Note (Signed)
Follows with hematology

## 2016-10-29 NOTE — Assessment & Plan Note (Signed)
Despite several rounds of antibiotics her CXR still concerning for inflammatory and possibly infectious findings. She struggles chronically with SOB and congestion. Due to CT findings and symptoms, repeat CT scan has been ordered and she has been referred to pulmonology for further consideration.

## 2016-10-29 NOTE — Assessment & Plan Note (Signed)
Well controlled, no changes to meds. Encouraged heart healthy diet such as the DASH diet and exercise as tolerated.  °

## 2016-10-29 NOTE — Assessment & Plan Note (Signed)
Has gained a couple of pounds since last visit by eating more proteins.

## 2016-10-29 NOTE — Assessment & Plan Note (Signed)
Encouraged heart healthy diet, increase exercise, avoid trans fats, consider a krill oil cap daily 

## 2016-10-29 NOTE — Assessment & Plan Note (Signed)
minimize simple carbs. Increase exercise as tolerated.  

## 2016-11-02 ENCOUNTER — Ambulatory Visit
Admission: RE | Admit: 2016-11-02 | Discharge: 2016-11-02 | Disposition: A | Discharge status: Home / Self Care | Payer: BLUE CROSS/BLUE SHIELD | Source: Ambulatory Visit | Attending: Family Medicine | Admitting: Family Medicine

## 2016-11-02 DIAGNOSIS — Z1231 Encounter for screening mammogram for malignant neoplasm of breast: Secondary | ICD-10-CM

## 2016-11-07 ENCOUNTER — Other Ambulatory Visit: Payer: Self-pay | Admitting: Family Medicine

## 2016-11-07 MED ORDER — MONTELUKAST SODIUM 10 MG PO TABS: 10.0000 mg | ORAL_TABLET | Freq: Every day | ORAL | 3 refills | Status: DC

## 2016-11-07 MED ORDER — CETIRIZINE HCL 10 MG PO TABS: 10.0000 mg | ORAL_TABLET | Freq: Every day | ORAL | 3 refills | Status: DC

## 2016-11-07 MED ORDER — RANITIDINE HCL 300 MG PO TABS: 300.0000 mg | ORAL_TABLET | Freq: Every day | ORAL | 3 refills | Status: DC

## 2016-11-07 MED ORDER — PRAMIPEXOLE DIHYDROCHLORIDE 0.25 MG PO TABS: ORAL_TABLET | ORAL | 1 refills | Status: DC

## 2016-11-07 MED ORDER — CITALOPRAM HYDROBROMIDE 40 MG PO TABS: 40.0000 mg | ORAL_TABLET | Freq: Every morning | ORAL | 3 refills | Status: DC

## 2016-11-07 MED ORDER — FLUTICASONE PROPIONATE 50 MCG/ACT NA SUSP: 2.0000 | Freq: Every day | NASAL | 3 refills | Status: DC

## 2016-11-07 NOTE — Addendum Note (Signed)
Addended by: Sharon Seller B on: 11/07/2016 10:36 AM   Modules accepted: Orders

## 2016-11-24 ENCOUNTER — Other Ambulatory Visit: Payer: Self-pay | Admitting: Hematology & Oncology

## 2016-11-24 DIAGNOSIS — D473 Essential (hemorrhagic) thrombocythemia: Secondary | ICD-10-CM

## 2016-11-26 ENCOUNTER — Other Ambulatory Visit: Payer: Self-pay | Admitting: Family Medicine

## 2016-11-26 DIAGNOSIS — E559 Vitamin D deficiency, unspecified: Secondary | ICD-10-CM

## 2016-11-26 DIAGNOSIS — R739 Hyperglycemia, unspecified: Secondary | ICD-10-CM

## 2016-11-26 DIAGNOSIS — R300 Dysuria: Secondary | ICD-10-CM

## 2016-11-26 DIAGNOSIS — I10 Essential (primary) hypertension: Secondary | ICD-10-CM

## 2016-11-27 ENCOUNTER — Encounter: Payer: Self-pay | Admitting: Internal Medicine

## 2016-11-27 ENCOUNTER — Ambulatory Visit (INDEPENDENT_AMBULATORY_CARE_PROVIDER_SITE_OTHER): Payer: BLUE CROSS/BLUE SHIELD | Admitting: Internal Medicine

## 2016-11-27 ENCOUNTER — Ambulatory Visit (INDEPENDENT_AMBULATORY_CARE_PROVIDER_SITE_OTHER)
Admission: RE | Admit: 2016-11-27 | Discharge: 2016-11-27 | Disposition: A | Discharge status: Home / Self Care | Payer: BLUE CROSS/BLUE SHIELD | Source: Ambulatory Visit | Attending: Internal Medicine | Admitting: Internal Medicine

## 2016-11-27 VITALS — BP 124/74 | HR 92 | Ht 64.0 in | Wt 140.0 lb

## 2016-11-27 DIAGNOSIS — R05 Cough: Secondary | ICD-10-CM

## 2016-11-27 DIAGNOSIS — R059 Cough, unspecified: Secondary | ICD-10-CM

## 2016-11-27 DIAGNOSIS — J479 Bronchiectasis, uncomplicated: Secondary | ICD-10-CM | POA: Diagnosis not present

## 2016-11-27 DIAGNOSIS — J45991 Cough variant asthma: Secondary | ICD-10-CM | POA: Diagnosis not present

## 2016-11-27 DIAGNOSIS — R058 Other specified cough: Secondary | ICD-10-CM

## 2016-11-27 MED ORDER — TRAMADOL HCL 50 MG PO TABS: ORAL_TABLET | ORAL | 0 refills | Status: DC

## 2016-11-27 MED ORDER — BUDESONIDE-FORMOTEROL FUMARATE 80-4.5 MCG/ACT IN AERO: 2.0000 | INHALATION_SPRAY | Freq: Two times a day (BID) | RESPIRATORY_TRACT | 0 refills | Status: DC

## 2016-11-27 MED ORDER — BUDESONIDE-FORMOTEROL FUMARATE 160-4.5 MCG/ACT IN AERO: 2.0000 | INHALATION_SPRAY | Freq: Two times a day (BID) | RESPIRATORY_TRACT | 0 refills | Status: DC

## 2016-11-27 MED ORDER — ACETAMINOPHEN-CODEINE #3 300-30 MG PO TABS: ORAL_TABLET | ORAL | 0 refills | Status: DC

## 2016-11-27 NOTE — Patient Instructions (Addendum)
Permanently stop fish oil  Continue symbicort but try the 80 strength Take 2 puffs first thing in am and then another 2 puffs about 12 hours later.     Work on inhaler technique:  relax and gently blow all the way out then take a nice smooth deep breath back in, triggering the inhaler at same time you start breathing in.  Hold for up to 5 seconds if you can. Blow out thru nose. Rinse and gargle with water when done   Please remember to go to the   x-ray department downstairs in the basement  for your tests - we will call you with the results when they are available.  Please see patient coordinator before you leave today  to schedule sinus CT   Protonix (prilosec same thing so take one or the other but not both)  40 mg Take 30- 60 min before your first and last meals of the day and also zantac at bedtime   Take delsym two tsp every 12 hours and supplement if needed with  Tylenol #3  up to 2 every 4 hours to suppress the urge to cough. Swallowing water or using ice chips/non mint and menthol containing candies (such as lifesavers or sugarless jolly ranchers) are also effective.  You should rest your voice and avoid activities that you know make you cough.  Once you have eliminated the cough for 3 straight days try reducing the tylenol #3   first,  then the delsym as tolerated.   GERD (REFLUX)  is an extremely common cause of respiratory symptoms just like yours , many times with no obvious heartburn at all.    It can be treated with medication, but also with lifestyle changes including elevation of the head of your bed (ideally with 6 inch  bed blocks),  Smoking cessation, avoidance of late meals, excessive alcohol, and avoid fatty foods, chocolate, peppermint, colas, red wine, and acidic juices such as orange juice.  NO MINT OR MENTHOL PRODUCTS SO NO COUGH DROPS   USE SUGARLESS CANDY INSTEAD (Jolley ranchers or Stover's or Life Savers) or even ice chips will also do - the key is to swallow to  prevent all throat clearing. NO OIL BASED VITAMINS - use powdered substitutes.    See Tammy NP w/in 2 weeks with all your medications, even over the counter meds, separated in two separate bags, the ones you take no matter what vs the ones you stop once you feel better and take only as needed when you feel you need them.   Tammy  will generate for you a new user friendly medication calendar that will put [place] all on the same page re: your medication use.     Without this process, it simply isn't possible to assure that we are providing  your outpatient care  with  the attention to detail we feel you deserve.   If we cannot assure that you're getting that kind of care,  then we cannot manage your problem effectively from this clinic.  Once you have seen Tammy and we are sure that we're all on the same page with your medication use she will arrange follow up with me.

## 2016-11-27 NOTE — Progress Notes (Signed)
Subjective:     Patient ID: Rachael Johnson, female   DOB: 1951-06-19,   MRN: [id number]  HPI  89 yowf quit smoking  1992 with onset of breathing problems spring 2017 and gradually worse since then assoc with harsh coughing fits referred to pulmonary clinic 11/27/2016 by Dr   Fayrene Helper with CT scan bronchiecatsis/ MPN's  10/21/16    11/27/2016 1st [facility] office visit/ Rachael Johnson   Chief Complaint  Patient presents with  . Pulmonary Consult    Referred by Dr. [name] for eval of abnormal ct chest. She c/o SOB and cough for the past 6-8 months. Her cough is non prod. She states occ she gets SOB walking short distances such as from room to room at home.   only doubled hydrea on 10/17/16 and worse since then with severe day > noct  Dry coughing fits to point of gagging and doe x across the room not reproduced at office today.  Onset originally was indolent/ pattern has been progressively worse esp since last month or so but does not connect the change in hydrea with her symptoms.  On 3 pages of meds with very poor insight on how / when to use them and reports no better with symbicort/singulair/ saba    No obvious day to day or daytime variability or assoc excess/ purulent sputum or mucus plugs or hemoptysis or cp or chest tightness, subjective wheeze or overt sinus or hb symptoms. No unusual exp hx or h/o childhood pna/ asthma or knowledge of premature birth.  Also denies any obvious fluctuation of symptoms with weather or environmental changes or other aggravating or alleviating factors except as outlined above   Current Medications, Allergies, Complete Past Medical History, Past Surgical History, Family History, and Social History were reviewed in [employer] record.  ROS  The following are not active complaints unless bolded sore throat, dysphagia, dental problems, itching, sneezing,  nasal congestion or excess/ purulent secretions, ear ache,   fever, chills,  sweats, unintended wt loss, classically pleuritic or exertional cp,  orthopnea pnd or leg swelling, presyncope, palpitations, abdominal pain, anorexia, nausea, vomiting, diarrhea  or change in bowel or bladder habits, change in stools or urine, dysuria,hematuria,  rash, arthralgias, visual complaints, headache, numbness, weakness or ataxia or problems with walking or coordination,  change in mood/affect or memory.              Review of Systems     Objective:   Physical Exam Anxious wf Patient failed to answer a single question asked in a straightforward manner, tending to go off on tangents or answer questions with ambiguous medical terms or diagnoses and seemed perplexed   when asked the same question more than once for clarification.  Note mint in mouth   Wt Readings from Last 3 Encounters:  11/27/16 140 lb (63.5 kg)  10/26/16 135 lb 3.2 oz (61.3 kg)  10/17/16 129 lb (58.5 kg)    Vital signs reviewed  - Note on arrival 02 sats  98% on RA      HEENT: nl dentition, turbinates bilaterally, and oropharynx. Nl external ear canals without cough reflex   NECK :  without JVD/Nodes/TM/ nl carotid upstrokes bilaterally   LUNGS: no acc muscle use,  Nl contour chest which is clear to A and P bilaterally without cough on insp or exp maneuvers   CV:  RRR  no s3 or murmur or increase in P2, and no edema   ABD:  soft and nontender with nl inspiratory excursion in the supine position. No bruits or organomegaly appreciated, bowel sounds nl  MS:  Nl gait/ ext warm without deformities, calf tenderness, cyanosis or clubbing No obvious joint restrictions   SKIN: warm and dry without lesions    NEURO:  alert, approp, nl sensorium with  no motor or cerebellar deficits apparent.    CXR PA and Lateral:   11/27/2016 :    I personally reviewed images and agree with radiology impression as follows:        Assessment:

## 2016-11-27 NOTE — Progress Notes (Signed)
Spoke with pt and notified of results per Dr. Wert. Pt verbalized understanding and denied any questions. 

## 2016-11-28 ENCOUNTER — Telehealth: Payer: Self-pay | Admitting: Internal Medicine

## 2016-11-28 ENCOUNTER — Telehealth: Payer: Self-pay | Admitting: Family Medicine

## 2016-11-28 DIAGNOSIS — J479 Bronchiectasis, uncomplicated: Secondary | ICD-10-CM | POA: Insufficient documentation

## 2016-11-28 NOTE — Assessment & Plan Note (Addendum)
-   Spirometry 11/27/2016  wnl p am symbicort no curvature  - 11/27/2016  After extensive coaching HFA effectiveness =    75% from a baseline of 25%  Sinus CT 11/27/2016 >>>   Not clear she really has asthma here and more likely present symptoms are more likely related to  Upper airway cough syndrome (previously labeled PNDS) , is  so named because it's frequently impossible to sort out how much is  CR/sinusitis with freq throat clearing (which can be related to primary GERD)   vs  causing  secondary (" extra esophageal")  GERD from wide swings in gastric pressure that occur with throat clearing, often  promoting self use of mint and menthol lozenges that reduce the lower esophageal sphincter tone and exacerbate the problem further in a cyclical fashion.   These are the same pts (now being labeled as having "irritable larynx syndrome" by some cough centers) who not infrequently have a history of having failed to tolerate ace inhibitors,  dry powder inhalers or biphosphonates or report having atypical/extraesophageal reflux symptoms that don't respond to standard doses of PPI  and are easily confused as having aecopd or asthma flares by even experienced allergists/ pulmonologists (myself included).   Of the three most common causes of  Sub-acute or recurrent or chronic cough, only one (GERD)  can actually contribute to/ trigger  the other two (asthma and post nasal drip syndrome)  and perpetuate the cylce of cough.  While not intuitively obvious, many patients with chronic low grade reflux do not cough until there is a primary insult that disturbs the protective epithelial barrier and exposes sensitive nerve endings.   This is typically viral but can be direct physical injury such as with an endotracheal tube.   The point is that once this occurs, it is difficult to eliminate the cycle  using anything but a maximally effective acid suppression regimen at least in the short run, accompanied by an appropriate diet  to address non acid GERD and control / eliminate the cough itself for at least 3 days.  rec max rx for gerd/ cyclical cough with tramadol then return for med reconciliation   To keep things simple, I have asked the patient to first separate medicines that are perceived as maintenance, that is to be taken daily "no matter what", from those medicines that are taken on only on an as-needed basis and I have given the patient examples of both, and then return to see our NP to generate a  detailed  medication calendar which should be followed until the next physician sees the patient and updates it.    Total time devoted to counseling  > 50 % of initial 60 min office visit:  review case with pt/sister in law  discussion of options/alternatives/ personally creating written customized instructions  in presence of pt  then going over those specific  Instructions directly with the pt including how to use all of the meds but in particular covering each new medication in detail and the difference between the maintenance= "automatic" meds and the prns using an action plan format for the latter (If this problem/symptom => do that organization reading Left to right).  Please see AVS from this visit for a full list of these instructions which I personally wrote for this pt and  are unique to this visit.

## 2016-11-28 NOTE — Telephone Encounter (Signed)
fwded msg to Robin via staff msg

## 2016-11-28 NOTE — Assessment & Plan Note (Signed)
See CT chest  10/21/16   Although there are clearly abnormalities on CT scan, they should probably be considered "microscopic" since not obvious on plain cxr .     In the setting of obvious "macroscopic" health issues,  I am very reluctatnt to embark on an invasive w/u at this point but will arrange consevative  follow up and in the meantime see what we can do to address the patient's subjective concerns.   Discussed in detail all the  indications, usual  risks and alternatives  relative to the benefits with patient who agrees to proceed with conservative f/u as outlined

## 2016-11-28 NOTE — Telephone Encounter (Signed)
-----   Message from Katha Hamming sent at 11/28/2016 12:19 PM EDT ----- Regarding: ct chest Snow called to say that she went and saw Dr. Melvyn Novas and he told her to hold off on having the CT Chest ordered by Dr. Charlett Blake.   He has ordered a CT maxillofacial and that will be done 5/17.   She requests and phone call from Dr. Charlett Blake or the nurse.    Thanks, Kathlene November

## 2016-11-28 NOTE — Telephone Encounter (Signed)
Dr. Melvyn Novas did another chest xray yesterday. She wanted PCP to be aware that Dr. Melvyn Novas prescribed for just a couple weeks tylenol 3. He told her to continue taking the hydrocodone as instructed.  She wanted PCP to be aware of the tylenol 3 due to PCP prescribing hydrocodone.

## 2016-11-28 NOTE — Telephone Encounter (Signed)
-----   Message from Margot Ables sent at 11/28/2016 12:24 PM EDT ----- Regarding: RE: ct chest Added Wayne Wicklund to f/u with the patient as requested. ----- Message ----- From: Katha Hamming Sent: 11/28/2016  12:19 PM To: Synthia Innocent, Mosie Lukes, MD, # Subject: ct chest                                       Zarai called to say that she went and saw Dr. Melvyn Novas and he told her to hold off on having the CT Chest ordered by Dr. Charlett Blake.   He has ordered a CT maxillofacial and that will be done 5/17.   She requests and phone call from Dr. Charlett Blake or the nurse.    Thanks, Kathlene November

## 2016-11-28 NOTE — Telephone Encounter (Signed)
Patient would like to speak with Shirlean Mylar directly regarding CT orders Dr. Charlett Blake placed, patient states Dr. Gala Romney advised to hold off and would like to speak with Robin directly today, please advise

## 2016-11-28 NOTE — Telephone Encounter (Signed)
lmtcb X1 for pt  

## 2016-11-28 NOTE — Telephone Encounter (Signed)
ok 

## 2016-11-28 NOTE — Telephone Encounter (Signed)
Already spoken to her on another telephone note.

## 2016-11-29 ENCOUNTER — Ambulatory Visit (HOSPITAL_BASED_OUTPATIENT_CLINIC_OR_DEPARTMENT_OTHER): Payer: BLUE CROSS/BLUE SHIELD | Admitting: Hematology & Oncology

## 2016-11-29 ENCOUNTER — Other Ambulatory Visit (HOSPITAL_BASED_OUTPATIENT_CLINIC_OR_DEPARTMENT_OTHER): Payer: BLUE CROSS/BLUE SHIELD

## 2016-11-29 VITALS — BP 126/67 | HR 87 | Temp 97.8°F | Resp 17 | Wt 137.0 lb

## 2016-11-29 DIAGNOSIS — D473 Essential (hemorrhagic) thrombocythemia: Secondary | ICD-10-CM

## 2016-11-29 LAB — COMPREHENSIVE METABOLIC PANEL
ALT: 10 U/L (ref 0–55)
AST: 19 U/L (ref 5–34)
Albumin: 4.1 g/dL (ref 3.5–5.0)
Alkaline Phosphatase: 77 U/L (ref 40–150)
Anion Gap: 8 mEq/L (ref 3–11)
BUN: 18.3 mg/dL (ref 7.0–26.0)
CO2: 27 mEq/L (ref 22–29)
Calcium: 8.9 mg/dL (ref 8.4–10.4)
Chloride: 105 mEq/L (ref 98–109)
Creatinine: 0.8 mg/dL (ref 0.6–1.1)
EGFR: 80 mL/min/{1.73_m2} — ABNORMAL LOW (ref >90)
Glucose: 65 mg/dl — ABNORMAL LOW (ref 70–140)
Potassium: 4.2 mEq/L (ref 3.5–5.1)
Sodium: 140 mEq/L (ref 136–145)
Total Bilirubin: <0.22 mg/dL (ref 0.20–1.20)
Total Protein: 7.5 g/dL (ref 6.4–8.3)

## 2016-11-29 LAB — CBC WITH DIFFERENTIAL (CANCER CENTER ONLY)
BASO#: 0 10*3/uL (ref 0.0–0.2)
BASO%: 0.4 % (ref 0.0–2.0)
EOS%: 3.1 % (ref 0.0–7.0)
Eosinophils Absolute: 0.1 10*3/uL (ref 0.0–0.5)
HCT: 34.5 % — ABNORMAL LOW (ref 34.8–46.6)
HGB: 11.4 g/dL — ABNORMAL LOW (ref 11.6–15.9)
LYMPH#: 0.7 10*3/uL — ABNORMAL LOW (ref 0.9–3.3)
LYMPH%: 15 % (ref 14.0–48.0)
MCH: 41.6 pg — ABNORMAL HIGH (ref 26.0–34.0)
MCHC: 33 g/dL (ref 32.0–36.0)
MCV: 126 fL — ABNORMAL HIGH (ref 81–101)
MONO#: 0.3 10*3/uL (ref 0.1–0.9)
MONO%: 6.5 % (ref 0.0–13.0)
NEUT#: 3.3 10*3/uL (ref 1.5–6.5)
NEUT%: 75 % (ref 39.6–80.0)
Platelets: 350 10*3/uL (ref 145–400)
RBC: 2.74 10*6/uL — ABNORMAL LOW (ref 3.70–5.32)
RDW: 12.7 % (ref 11.1–15.7)
WBC: 4.5 10*3/uL (ref 3.9–10.0)

## 2016-11-29 LAB — IRON AND TIBC
%SAT: 20 % — ABNORMAL LOW (ref 21–57)
Iron: 44 ug/dL (ref 41–142)
TIBC: 221 ug/dL — ABNORMAL LOW (ref 236–444)
UIBC: 177 ug/dL (ref 120–384)

## 2016-11-29 LAB — FERRITIN: Ferritin: 299 ng/ml — ABNORMAL HIGH (ref 9–269)

## 2016-11-29 NOTE — Progress Notes (Signed)
Hematology and Oncology Follow Up Visit  VIRTIE Johnson [id number] [birth year] 66 y.o. 11/29/2016   Principle Diagnosis:  Essential thrombocythemia - JAK2 negative. 2. Intermittent iron-deficiency anemia.  Current Therapy:   Hydrea 1000/1000/500 mg by mouth daily Aspirin 81 mg by mouth daily IV iron as indicated - last dose given on 02/21/2069      Interim History:  Ms.  Rachael Johnson is back for followup.  She looks quite good. I must say, that she does look quite good. I am just happy for her.  She is not complaining of any pain. She's still is having problems with cough and shortness of breath. She recently saw Dr. Clois Johnson of pulmonary medicine. He is working very hard to get her breathing better. He is trying a lot of intervention suggested help her. I'm sure that with his expertise, that she will feel better.  Her iron studies that we are she did today showed a ferritin of 299 with iron saturation of 20%. This actually is a little bit on the low side for her. We may want to give her some iron. This may help her.  She seems to be eating a little bit better. She has had no nausea or vomiting.  There's been no rash. She's had no leg swelling.  She is looking for to going to the beach. I think she is going in the next week or so. She will be gone for 2 weeks.  Overall, her performance status is ECOG 1.   Medications:  Current Outpatient Prescriptions:  .  acetaminophen-codeine (TYLENOL #3) 300-30 MG tablet, One every 4 hours as needed for cough, Disp: 40 tablet, Rfl: 0 .  ALPRAZolam (XANAX) 1 MG tablet, Take 1 tablet (1 mg total) by mouth 3 (three) times daily as needed for anxiety., Disp: 180 tablet, Rfl: 1 .  amitriptyline (ELAVIL) 10 MG tablet, Take 2 tablets (20 mg total) by mouth at bedtime., Disp: 90 tablet, Rfl: 1 .  aspirin EC 81 MG tablet, Take 81 mg by mouth every morning., Disp: , Rfl:  .  budesonide-formoterol (SYMBICORT) 80-4.5 MCG/ACT inhaler, Inhale 2 puffs into the lungs  2 (two) times daily., Disp: 1 Inhaler, Rfl: 0 .  Calcium Carbonate-Vitamin D (CALTRATE COLON HEALTH PO), Take by mouth., Disp: , Rfl:  .  cetirizine (ZYRTEC) 10 MG tablet, Take 1 tablet (10 mg total) by mouth daily., Disp: 90 tablet, Rfl: 3 .  citalopram (CELEXA) 40 MG tablet, Take 1 tablet (40 mg total) by mouth every morning., Disp: 90 tablet, Rfl: 3 .  Cranberry 300 MG tablet, Take 300 mg by mouth 2 (two) times daily., Disp: , Rfl:  .  cyclobenzaprine (FLEXERIL) 10 MG tablet, TAKE 1 TABLET (10 MG TOTAL) BY MOUTH 2 (TWO) TIMES DAILY AS NEEDED FOR MUSCLE SPASMS., Disp: 60 tablet, Rfl: 3 .  estradiol (ESTRACE) 0.5 MG tablet, Take 1 tablet (0.5 mg total) by mouth 2 (two) times daily., Disp: 180 tablet, Rfl: 1 .  fluorometholone (FML) 0.1 % ophthalmic suspension, Place 1 drop into both eyes daily as needed (For dry eyes.). , Disp: , Rfl:  .  fluticasone (FLONASE) 50 MCG/ACT nasal spray, Place 2 sprays into both nostrils daily., Disp: 48 g, Rfl: 3 .  folic acid (FOLVITE) 1 MG tablet, Take 1 mg by mouth daily., Disp: , Rfl:  .  furosemide (LASIX) 20 MG tablet, Take 1 tablet (20 mg total) by mouth 2 (two) times daily as needed for fluid or edema., Disp: 90 tablet, Rfl:  1 .  HYDROcodone-acetaminophen (NORCO) 5-325 MG tablet, Take 1 tablet by mouth every 6 (six) hours as needed for severe pain. Take one tablet by mouth every 6 hours as needed for severe pain, Disp: 120 tablet, Rfl: 0 .  hydroxyurea (HYDREA) 500 MG capsule, TAKE 2 CAPSULES BY MOUTH DAILY, Disp: 60 capsule, Rfl: 2 .  lactulose (CHRONULAC) 10 GM/15ML solution, TAKE 2 TABLESPOONFULS (30 ML) BY MOUTH 3 TIMES A DAY FOR CONSTIPATION, Disp: 1000 mL, Rfl: 8 .  levothyroxine (SYNTHROID, LEVOTHROID) 88 MCG tablet, Take 1 tablet (88 mcg total) by mouth daily., Disp: 30 tablet, Rfl: 5 .  meclizine (ANTIVERT) 12.5 MG tablet, TAKE 1 TABLET THREE TIMES A DAY AS NEEDED FOR NAUSEA OR DIZZINESS, Disp: 30 tablet, Rfl: 1 .  montelukast (SINGULAIR) 10 MG tablet,  Take 1 tablet (10 mg total) by mouth at bedtime., Disp: 90 tablet, Rfl: 3 .  Multiple Vitamin (MULTIVITAMIN PO), Take 1 tablet by mouth every morning. , Disp: , Rfl:  .  naproxen (NAPROSYN) 500 MG tablet, TAKE 1 TABLET (500 MG TOTAL) BY MOUTH 2 (TWO) TIMES DAILY WITH A MEAL., Disp: 180 tablet, Rfl: 1 .  omeprazole (PRILOSEC) 40 MG capsule, Take 1 capsule (40 mg total) by mouth 2 (two) times daily., Disp: 180 capsule, Rfl: 0 .  ondansetron (ZOFRAN) 8 MG tablet, TAKE ONE TABLET BY MOUTH EVERY 8 HOURS AS NEEDED NAUSEA AND VOMITING, Disp: 20 tablet, Rfl: 0 .  pantoprazole (PROTONIX) 40 MG tablet, Take 1 tablet (40 mg total) by mouth daily., Disp: 90 tablet, Rfl: 0 .  pramipexole (MIRAPEX) 0.25 MG tablet, TAKE ONE OR TWO TABLETS BY MOUTH AT BEDTIME AS NEEDED, Disp: 180 tablet, Rfl: 1 .  Probiotic Product (PROBIOTIC DAILY PO), Take by mouth 2 (two) times daily., Disp: , Rfl:  .  promethazine (PHENERGAN) 25 MG tablet, Take 1 tablet (25 mg total) by mouth every 6 (six) hours as needed for nausea or vomiting., Disp: 60 tablet, Rfl: 4 .  ranitidine (ZANTAC) 300 MG tablet, Take 1 tablet (300 mg total) by mouth at bedtime., Disp: 90 tablet, Rfl: 3 .  RESTASIS 0.05 % ophthalmic emulsion, Place 1 drop into both eyes 2 (two) times daily. , Disp: , Rfl: 3 .  venlafaxine XR (EFFEXOR-XR) 150 MG 24 hr capsule, Take 1 capsule (150 mg total) by mouth daily with breakfast., Disp: 90 capsule, Rfl: 1 .  venlafaxine XR (EFFEXOR-XR) 75 MG 24 hr capsule, TAKE ONE CAPSULE BY MOUTH DAILY WITH BREAKFAST, Disp: 90 capsule, Rfl: 1 .  VENTOLIN HFA 108 (90 Base) MCG/ACT inhaler, INHALE 2 PUFFS INTO THE LUNGS EVERY 6 (SIX) HOURS AS NEEDED FOR WHEEZING OR SHORTNESS OF BREATH., Disp: 18 Inhaler, Rfl: 8 .  Vitamin D, Ergocalciferol, (DRISDOL) 50000 units CAPS capsule, Take 1 capsule (50,000 Units total) by mouth every 7 (seven) days., Disp: 12 capsule, Rfl: 0  Allergies: No Known Allergies  Past Medical History, Surgical history,  Social history, and Family History were reviewed and updated.  Review of Systems: As above  Physical Exam:  weight is 137 lb (62.1 kg). Her oral temperature is 97.8 F (36.6 C). Her blood pressure is 126/67 and her pulse is 87. Her respiration is 17 and oxygen saturation is 97%.   Thin white female. Head and neck exam shows no ocular or oral lesions. She has some slight temporal muscle wasting. There is no adenopathy in her neck. Lungs are clear on the right side. There might be some slight decrease on the left  side. No wheezes or crackles are noted bilaterally. Cardiac exam regular rate and rhythm. No murmurs. Abdomen is soft. She has good bowel sounds. There is no fluid wave. There is no palpable liver or spleen. Back exam shows no tenderness over the spine ribs or hips. Extremities shows mild chronic nonpitting edema. she has good range of motion of her joints. She has a compression sleeve on her right arm. There is no swelling of the right arm. There is no cyanosis of her lower extremities. She has good pulses. . Skin exam no rashes. Neurological exam no focal deficits.  Lab Results  Component Value Date   WBC 4.5 11/29/2016   HGB 11.4 (L) 11/29/2016   HCT 34.5 (L) 11/29/2016   MCV 126 (H) 11/29/2016   PLT 350 11/29/2016     Chemistry      Component Value Date/Time   NA 140 11/29/2016 0936   K 4.2 11/29/2016 0936   CL 103 06/19/2016 1221   CL 98 05/15/2016 0930   CO2 27 11/29/2016 0936   BUN 18.3 11/29/2016 0936   CREATININE 0.8 11/29/2016 0936      Component Value Date/Time   CALCIUM 8.9 11/29/2016 0936   ALKPHOS 77 11/29/2016 0936   AST 19 11/29/2016 0936   ALT 10 11/29/2016 0936   BILITOT <0.22 11/29/2016 0936         Impression and Plan: Ms. Simeone is 66 year old white female with essential thrombocythemia.  I will adjust her Hydrea dose. She will take 1000 mg 2 days in a row and then 500 mg one day.  I will like to see her back in 6 weeks. Hopefully, her  platelet count will continue to normalize.   I'm just so glad that she is doing better. Hopefully, going to the beach with her family will make her feel well.     [name], MD 5/2/20183:13 PM

## 2016-11-29 NOTE — Telephone Encounter (Signed)
lmomtcb x 2  

## 2016-11-30 NOTE — Telephone Encounter (Signed)
Called and spoke with pt and she is aware of what MW is doing with the ct scan.

## 2016-12-13 ENCOUNTER — Other Ambulatory Visit: Payer: Self-pay | Admitting: Family Medicine

## 2016-12-14 ENCOUNTER — Ambulatory Visit (HOSPITAL_BASED_OUTPATIENT_CLINIC_OR_DEPARTMENT_OTHER)
Admission: RE | Admit: 2016-12-14 | Discharge: 2016-12-14 | Disposition: A | Discharge status: Home / Self Care | Payer: BLUE CROSS/BLUE SHIELD | Source: Ambulatory Visit | Attending: Internal Medicine | Admitting: Internal Medicine

## 2016-12-14 DIAGNOSIS — R05 Cough: Secondary | ICD-10-CM | POA: Diagnosis present

## 2016-12-14 DIAGNOSIS — R058 Other specified cough: Secondary | ICD-10-CM

## 2016-12-14 NOTE — Telephone Encounter (Signed)
Refill is too soon Was given #180 with 1 refill on 08/28/2016 Will call the patient to let her know too soon. Route to PCP to review

## 2016-12-14 NOTE — Telephone Encounter (Signed)
Requesting:  alprazolam Contract  08/17/2014 UDS   Moderate  04/16/15 Last OV   10/26/2016 Last Refill   #180 with 1 refill on 08/28/2016  Please Advise

## 2016-12-14 NOTE — Progress Notes (Signed)
LMTCB

## 2016-12-14 NOTE — Telephone Encounter (Signed)
I have approved but she needs UDS and contract updated

## 2016-12-17 NOTE — Telephone Encounter (Signed)
Did we hear back from her? Is she having increased stressors?

## 2016-12-18 ENCOUNTER — Ambulatory Visit (INDEPENDENT_AMBULATORY_CARE_PROVIDER_SITE_OTHER): Payer: BLUE CROSS/BLUE SHIELD | Admitting: Adult Health

## 2016-12-18 ENCOUNTER — Encounter: Payer: Self-pay | Admitting: Adult Health

## 2016-12-18 VITALS — BP 126/74 | HR 73 | Ht 64.0 in | Wt 137.4 lb

## 2016-12-18 DIAGNOSIS — J45991 Cough variant asthma: Secondary | ICD-10-CM

## 2016-12-18 DIAGNOSIS — J479 Bronchiectasis, uncomplicated: Secondary | ICD-10-CM | POA: Diagnosis not present

## 2016-12-18 DIAGNOSIS — R911 Solitary pulmonary nodule: Secondary | ICD-10-CM

## 2016-12-18 MED ORDER — BUDESONIDE-FORMOTEROL FUMARATE 80-4.5 MCG/ACT IN AERO: 2.0000 | INHALATION_SPRAY | Freq: Two times a day (BID) | RESPIRATORY_TRACT | 5 refills | Status: DC

## 2016-12-18 NOTE — Progress Notes (Signed)
Chart and office note reviewed in detail  > agree with a/p as outlined    

## 2016-12-18 NOTE — Assessment & Plan Note (Signed)
No active flare  CT sinus neg.   Cont to follow

## 2016-12-18 NOTE — Assessment & Plan Note (Signed)
Cough /Asthma no flare on lower dose Symbicort  Inhaler oral care reviewed Patient's medications were reviewed today and patient education was given. Computerized medication calendar was adjusted/completed   Plan  Patient Instructions  Set up CT chest in 1 month to follow up lung nodules .  Follow med calendar closely and bring to each visit .  Follow up with Dr. Melvyn Novas  In 4-6 weeks and .As needed

## 2016-12-18 NOTE — Assessment & Plan Note (Signed)
Lung nodules on CT chest along with bronchiectasis in former smoker  Check CT in 1 month (3 month interval )  Will need to follow for 2 yr serially.

## 2016-12-18 NOTE — Progress Notes (Signed)
Left detailed msg with results

## 2016-12-18 NOTE — Progress Notes (Signed)
@Patient  ID: Rachael Johnson, female    DOB: Jun 21, 1951, 66 y.o.   MRN: [id number]  Chief Complaint  Patient presents with  . Follow-up    asthma     Referring provider: Mosie Lukes, MD  HPI: 66 yo female former smoker (1992) seen for pulmonary consult 11/27/16 for cough and Bronchiectasis on CT chest (09/2016)  Followed by Dr. Marin Olp for essential thrombocythemia on hydrea.    TEST  CT chest 09/2016 >GGO, bronchectasis , irregular nodules 1.6cm in RUL ,  CT sinus 11/2016 >neg  Spirometry 10/2016 >neg     12/18/2016 Follow up : Cough/Bronchiectaiss /Med review  Pt returns for 3 week follow up . Pt was seen last ov for cough and abnormal CT chest .  Patient was referred for pulmonary consult. Last patient says she has a chronic cough. And gets recurrent bronchitis and pneumonia several times a year over the last 32 years. A recent CT chest March 2018 showed groundglass opacities along with bronchiectasis and irregular nodules in the right upper lobe. Largest measuring 1.6 cm/ last visit. Patient was recommended to decrease her Symbicort from 160 to 80. Stop fish oil. A CT sinus was done that was negative for acute sinus disease.  Patient returns today without any significant change in her symptoms. She denies any joint pain, fever, rash, chest pain, orthopnea, PND, or increased leg swelling. She is accompanied by her sister-in-law today. Pt is on multiple medications . We reviewed all her meds and organized them into a med calendar with pt education. Appears to be taking correctly.  She does have chronic pain on narcotics . Denies dysphagia.   No Known Allergies  Immunization History  Administered Date(s) Administered  . Influenza Whole 05/16/2013  . Influenza,inj,Quad PF,36+ Mos 04/02/2015, 04/18/2016  . Influenza-Unspecified 05/31/2014  . Pneumococcal Conjugate-13 04/04/2013  . Pneumococcal Polysaccharide-23 04/18/2016  . Pneumococcal-Unspecified 04/10/2011  . Td 08/01/2007   . Tdap 07/06/2014  . Zoster 04/28/2013    Past Medical History:  Diagnosis Date  . Acute pharyngitis 07/13/2014  . Allergy   . Anemia, iron deficiency 07/08/2012  . Arthritis   . Cataract 12/21/2014  . Cerumen impaction 04/28/2013  . Chest pain   . Depression   . Depression with anxiety 03/24/2011  . Diverticulitis   . Esophageal reflux 08/17/2013  . Essential thrombocythemia ([place]) 01/24/2011  . Frequent episodic tension-type headache   . Gastroenteritis 12/21/2014  . 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 GERD (gastroesophageal reflux disease)   . H. pylori infection 12/19/2012  . Hyperglycemia 12/17/2015  . Hyperglycemia 12/17/2015  . Hypertension   . Normal cardiac stress test   . Other and unspecified hyperlipidemia 02/25/2013  . Pedal edema 03/07/2016  . Pneumonia 04/29/2016  . Preventative health care 04/20/2013  . Restless leg syndrome 09/30/2014  . Scabies 03/28/2015  . Seizure ([place])    childhood  . Thyroid disease     Tobacco History: History  Smoking Status  . Former Smoker  . Packs/day: 3.50  . Years: 20.00  . Types: Cigarettes  . Start date: 11/15/1970  . Quit date: 07/31/1990  Smokeless Tobacco  . Never Used    Comment: quit 23 years ago   Counseling given: Not Answered   Outpatient Encounter Prescriptions as of 12/18/2016  Medication Sig  . acetaminophen-codeine (TYLENOL #3) 300-30 MG tablet One every 4 hours as needed for cough  . ALPRAZolam (XANAX) 1 MG tablet Take 1 tablet (1 mg total) by mouth 3 (three) times daily  as needed for anxiety.  Marland Kitchen amitriptyline (ELAVIL) 10 MG tablet Take 2 tablets (20 mg total) by mouth at bedtime.  Marland Kitchen aspirin EC 81 MG tablet Take 81 mg by mouth every morning.  . budesonide-formoterol (SYMBICORT) 80-4.5 MCG/ACT inhaler Inhale 2 puffs into the lungs 2 (two) times daily.  . Calcium Carbonate-Vitamin D (CALTRATE COLON HEALTH PO) Take by mouth.  . cetirizine (ZYRTEC) 10 MG tablet Take 1 tablet (10 mg total) by mouth daily.  . citalopram (CELEXA)  40 MG tablet Take 1 tablet (40 mg total) by mouth every morning.  . Cranberry 300 MG tablet Take 300 mg by mouth 2 (two) times daily.  . cyclobenzaprine (FLEXERIL) 10 MG tablet TAKE 1 TABLET (10 MG TOTAL) BY MOUTH 2 (TWO) TIMES DAILY AS NEEDED FOR MUSCLE SPASMS.  Marland Kitchen estradiol (ESTRACE) 0.5 MG tablet Take 1 tablet (0.5 mg total) by mouth 2 (two) times daily.  . fluorometholone (FML) 0.1 % ophthalmic suspension Place 1 drop into both eyes daily as needed (For dry eyes.).   Marland Kitchen fluticasone (FLONASE) 50 MCG/ACT nasal spray Place 2 sprays into both nostrils daily.  . folic acid (FOLVITE) 1 MG tablet Take 1 mg by mouth daily.  . furosemide (LASIX) 20 MG tablet Take 1 tablet (20 mg total) by mouth 2 (two) times daily as needed for fluid or edema.  Marland Kitchen HYDROcodone-acetaminophen (NORCO) 5-325 MG tablet Take 1 tablet by mouth every 6 (six) hours as needed for severe pain. Take one tablet by mouth every 6 hours as needed for severe pain  . hydroxyurea (HYDREA) 500 MG capsule TAKE 2 CAPSULES BY MOUTH DAILY  . lactulose (CHRONULAC) 10 GM/15ML solution TAKE 2 TABLESPOONFULS (30 ML) BY MOUTH 3 TIMES A DAY FOR CONSTIPATION  . levothyroxine (SYNTHROID, LEVOTHROID) 88 MCG tablet Take 1 tablet (88 mcg total) by mouth daily.  . meclizine (ANTIVERT) 12.5 MG tablet TAKE 1 TABLET THREE TIMES A DAY AS NEEDED FOR NAUSEA OR DIZZINESS  . montelukast (SINGULAIR) 10 MG tablet Take 1 tablet (10 mg total) by mouth at bedtime.  . Multiple Vitamin (MULTIVITAMIN PO) Take 1 tablet by mouth every morning.   . naproxen (NAPROSYN) 500 MG tablet TAKE 1 TABLET (500 MG TOTAL) BY MOUTH 2 (TWO) TIMES DAILY WITH A MEAL.  Marland Kitchen omeprazole (PRILOSEC) 40 MG capsule Take 1 capsule (40 mg total) by mouth 2 (two) times daily.  . ondansetron (ZOFRAN) 8 MG tablet TAKE ONE TABLET BY MOUTH EVERY 8 HOURS AS NEEDED NAUSEA AND VOMITING  . pantoprazole (PROTONIX) 40 MG tablet Take 1 tablet (40 mg total) by mouth daily.  . pramipexole (MIRAPEX) 0.25 MG tablet TAKE  ONE OR TWO TABLETS BY MOUTH AT BEDTIME AS NEEDED  . Probiotic Product (PROBIOTIC DAILY PO) Take by mouth 2 (two) times daily.  . promethazine (PHENERGAN) 25 MG tablet Take 1 tablet (25 mg total) by mouth every 6 (six) hours as needed for nausea or vomiting.  . ranitidine (ZANTAC) 300 MG tablet Take 1 tablet (300 mg total) by mouth at bedtime.  . RESTASIS 0.05 % ophthalmic emulsion Place 1 drop into both eyes 2 (two) times daily.   Marland Kitchen venlafaxine XR (EFFEXOR-XR) 150 MG 24 hr capsule Take 1 capsule (150 mg total) by mouth daily with breakfast.  . venlafaxine XR (EFFEXOR-XR) 75 MG 24 hr capsule TAKE ONE CAPSULE BY MOUTH DAILY WITH BREAKFAST  . VENTOLIN HFA 108 (90 Base) MCG/ACT inhaler INHALE 2 PUFFS INTO THE LUNGS EVERY 6 (SIX) HOURS AS NEEDED FOR WHEEZING OR SHORTNESS  OF BREATH.  Marland Kitchen Vitamin D, Ergocalciferol, (DRISDOL) 50000 units CAPS capsule Take 1 capsule (50,000 Units total) by mouth every 7 (seven) days.  . [DISCONTINUED] budesonide-formoterol (SYMBICORT) 80-4.5 MCG/ACT inhaler Inhale 2 puffs into the lungs 2 (two) times daily.   No facility-administered encounter medications on file as of 12/18/2016.      Review of Systems  Constitutional:   No  weight loss, night sweats,  Fevers, chills,  +fatigue, or  lassitude.  HEENT:   No headaches,  Difficulty swallowing,  Tooth/dental problems, or  Sore throat,                No sneezing, itching, ear ache,  +nasal congestion, post nasal drip,   CV:  No chest pain,  Orthopnea, PND, swelling in lower extremities, anasarca, dizziness, palpitations, syncope.   GI  No heartburn, indigestion, abdominal pain, nausea, vomiting, diarrhea, change in bowel habits, loss of appetite, bloody stools.   Resp: No shortness of breath with exertion or at rest.    No chest wall deformity  Skin: no rash or lesions.  GU: no dysuria, change in color of urine, no urgency or frequency.  No flank pain, no hematuria   MS:  No joint pain or swelling.  No decreased  range of motion.  No back pain.    Physical Exam  BP 126/74 (BP Location: Right Arm, Cuff Size: Normal)   Pulse 73   Ht 5\' 4"  (1.626 m)   Wt 137 lb 6.4 oz (62.3 kg)   LMP 07/31/1990   SpO2 97%   BMI 23.58 kg/m   GEN: A/Ox3; pleasant , NAD , thin    HEENT:  [place]/AT,  EACs-clear, TMs-wnl, NOSE-clear, THROAT-clear, no lesions, no postnasal drip or exudate noted.   NECK:  Supple w/ fair ROM; no JVD; normal carotid impulses w/o bruits; no thyromegaly or nodules palpated; no lymphadenopathy.    RESP  Clear  P & A; w/o, wheezes/ rales/ or rhonchi. no accessory muscle use, no dullness to percussion  CARD:  RRR, no m/r/g, no peripheral edema, pulses intact, no cyanosis or clubbing.  GI:   Soft & nt; nml bowel sounds; no organomegaly or masses detected.   Musco: Warm bil, no deformities or joint swelling noted.   Neuro: alert, no focal deficits noted.    Skin: Warm, no lesions or rashes    Lab Results:  CBC  BMET BNP No results found for: BNP  ProBNP No results found for: PROBNP  Imaging: Dg Chest 2 View  Result Date: 11/27/2016 CLINICAL DATA:  Nonproductive cough and shortness of breath for the past 8 months. EXAM: CHEST  2 VIEW COMPARISON:  Chest x-ray of October 17, 2016 and chest CT scan of October 21, 2016 FINDINGS: The lungs remain well-expanded. The interstitial markings remain increased to a mild degree bilaterally. There is no alveolar infiltrate or pleural effusion. The heart and pulmonary vascularity are normal. The trachea is midline. The bony thorax exhibits no acute abnormality. IMPRESSION: Persistent mild interstitial prominence especially in the lingula and right middle lobe. Given the findings on the March 24th CT scan proceeding with the previously recommended follow-up CT scan would be the most useful next imaging step. Electronically Signed   By: David  Martinique M.D.   On: 11/27/2016 14:25   Ct Maxillofacial Limited Wo Contrast  Result Date: 12/14/2016 CLINICAL  DATA:  Difficulty breathing.  Cough. EXAM: CT PARANASAL SINUS LIMITED WITHOUT CONTRAST TECHNIQUE: Non-contiguous multidetector CT images of the paranasal sinuses were obtained  in a single plane without contrast. COMPARISON:  07/18/2015 FINDINGS: Sphenoid, ethmoid, maxillary and frontal sinuses are clear without mucosal thickening or layering fluid. No evidence of polyp, cyst or mass. Nasal septum is midline. Incidental note of concha bullosa of the middle turbinates but with sufficient patency of the nasal passages. IMPRESSION: Negative study.  No evidence of sinus inflammatory disease. Electronically Signed   By: Nelson Chimes M.D.   On: 12/14/2016 11:53     Assessment & Plan:   Cough variant asthma vs UACS Cough /Asthma no flare on lower dose Symbicort  Inhaler oral care reviewed Patient's medications were reviewed today and patient education was given. Computerized medication calendar was adjusted/completed   Plan  Patient Instructions  Set up CT chest in 1 month to follow up lung nodules .  Follow med calendar closely and bring to each visit .  Follow up with Dr. Melvyn Novas  In 4-6 weeks and .As needed       Bronchiectasis assoc with MPNs No active flare  CT sinus neg.   Cont to follow   Lung nodule Lung nodules on CT chest along with bronchiectasis in former smoker  Check CT in 1 month (3 month interval )  Will need to follow for 2 yr serially.      Rexene Edison, NP 12/18/2016

## 2016-12-18 NOTE — Patient Instructions (Signed)
Set up CT chest in 1 month to follow up lung nodules .  Follow med calendar closely and bring to each visit .  Follow up with Dr. Melvyn Novas  In 4-6 weeks and .As needed

## 2016-12-18 NOTE — Telephone Encounter (Signed)
Spoke to the patient and she is not in need of this refill. The pharmacy just requested too soon. So disregard request. She states she is doing well and has no increased stressers at this time.

## 2016-12-18 NOTE — Telephone Encounter (Signed)
Called left message to call back 

## 2016-12-21 ENCOUNTER — Encounter: Payer: Self-pay | Admitting: Family Medicine

## 2016-12-21 ENCOUNTER — Ambulatory Visit (INDEPENDENT_AMBULATORY_CARE_PROVIDER_SITE_OTHER): Payer: BLUE CROSS/BLUE SHIELD | Admitting: Family Medicine

## 2016-12-21 DIAGNOSIS — I10 Essential (primary) hypertension: Secondary | ICD-10-CM | POA: Diagnosis not present

## 2016-12-21 DIAGNOSIS — R739 Hyperglycemia, unspecified: Secondary | ICD-10-CM

## 2016-12-21 DIAGNOSIS — R300 Dysuria: Secondary | ICD-10-CM | POA: Diagnosis not present

## 2016-12-21 DIAGNOSIS — E559 Vitamin D deficiency, unspecified: Secondary | ICD-10-CM | POA: Diagnosis not present

## 2016-12-21 DIAGNOSIS — F418 Other specified anxiety disorders: Secondary | ICD-10-CM | POA: Diagnosis not present

## 2016-12-21 MED ORDER — FLUTICASONE PROPIONATE 50 MCG/ACT NA SUSP: 2.0000 | Freq: Every day | NASAL | 3 refills | Status: DC

## 2016-12-21 MED ORDER — PRAMIPEXOLE DIHYDROCHLORIDE 0.25 MG PO TABS: ORAL_TABLET | ORAL | 1 refills | Status: DC

## 2016-12-21 MED ORDER — FUROSEMIDE 20 MG PO TABS: 20.0000 mg | ORAL_TABLET | Freq: Two times a day (BID) | ORAL | 1 refills | Status: DC | PRN

## 2016-12-21 MED ORDER — PRAMIPEXOLE DIHYDROCHLORIDE 0.25 MG PO TABS
ORAL_TABLET | ORAL | 1 refills | Status: DC
Start: 2016-12-21 — End: 2016-12-21

## 2016-12-21 MED ORDER — AMITRIPTYLINE HCL 50 MG PO TABS: 50.0000 mg | ORAL_TABLET | Freq: Every day | ORAL | 1 refills | Status: DC

## 2016-12-21 MED ORDER — SILVER SULFADIAZINE 1 % EX CREA: 1.0000 "application " | TOPICAL_CREAM | Freq: Two times a day (BID) | CUTANEOUS | 3 refills | Status: DC

## 2016-12-21 MED ORDER — CETIRIZINE HCL 10 MG PO TABS: 10.0000 mg | ORAL_TABLET | Freq: Every day | ORAL | 1 refills | Status: DC

## 2016-12-21 MED ORDER — OMEPRAZOLE 40 MG PO CPDR: 40.0000 mg | DELAYED_RELEASE_CAPSULE | Freq: Two times a day (BID) | ORAL | 1 refills | Status: DC

## 2016-12-21 MED ORDER — ESTRADIOL 0.5 MG PO TABS: 0.5000 mg | ORAL_TABLET | Freq: Two times a day (BID) | ORAL | 1 refills | Status: DC

## 2016-12-21 MED ORDER — NAPROXEN 500 MG PO TABS: ORAL_TABLET | ORAL | 1 refills | Status: DC

## 2016-12-21 MED ORDER — CITALOPRAM HYDROBROMIDE 40 MG PO TABS: 40.0000 mg | ORAL_TABLET | Freq: Every morning | ORAL | 3 refills | Status: DC

## 2016-12-21 MED ORDER — CYCLOBENZAPRINE HCL 10 MG PO TABS: 10.0000 mg | ORAL_TABLET | Freq: Two times a day (BID) | ORAL | 3 refills | Status: DC | PRN

## 2016-12-21 MED ORDER — BENZONATATE 100 MG PO CAPS: 100.0000 mg | ORAL_CAPSULE | Freq: Two times a day (BID) | ORAL | 1 refills | Status: DC | PRN

## 2016-12-21 MED ORDER — LEVOTHYROXINE SODIUM 88 MCG PO TABS: 88.0000 ug | ORAL_TABLET | Freq: Every day | ORAL | 1 refills | Status: DC

## 2016-12-21 MED ORDER — VENLAFAXINE HCL ER 150 MG PO CP24: 150.0000 mg | ORAL_CAPSULE | Freq: Every day | ORAL | 1 refills | Status: DC

## 2016-12-21 MED ORDER — RANITIDINE HCL 300 MG PO TABS: 300.0000 mg | ORAL_TABLET | Freq: Every day | ORAL | 1 refills | Status: DC

## 2016-12-21 MED ORDER — ALBUTEROL SULFATE HFA 108 (90 BASE) MCG/ACT IN AERS: INHALATION_SPRAY | RESPIRATORY_TRACT | 3 refills | Status: DC

## 2016-12-21 MED ORDER — VITAMIN D (ERGOCALCIFEROL) 1.25 MG (50000 UNIT) PO CAPS: 50000.0000 [IU] | ORAL_CAPSULE | ORAL | 0 refills | Status: DC

## 2016-12-21 NOTE — Patient Instructions (Signed)
Carbohydrate Counting for Diabetes Mellitus, Adult Carbohydrate counting is a method for keeping track of how many carbohydrates you eat. Eating carbohydrates naturally increases the amount of sugar (glucose) in the blood. Counting how many carbohydrates you eat helps keep your blood glucose within normal limits, which helps you manage your diabetes (diabetes mellitus). It is important to know how many carbohydrates you can safely have in each meal. This is different for every person. A diet and nutrition specialist (registered dietitian) can help you make a meal plan and calculate how many carbohydrates you should have at each meal and snack. Carbohydrates are found in the following foods:  Grains, such as breads and cereals.  Dried beans and soy products.  Starchy vegetables, such as potatoes, peas, and corn.  Fruit and fruit juices.  Milk and yogurt.  Sweets and snack foods, such as cake, cookies, candy, chips, and soft drinks. How do I count carbohydrates? There are two ways to count carbohydrates in food. You can use either of the methods or a combination of both. Reading "Nutrition Facts" on packaged food  The "Nutrition Facts" list is included on the labels of almost all packaged foods and beverages in the [place] It includes:  The serving size.  Information about nutrients in each serving, including the grams (g) of carbohydrate per serving. To use the "Nutrition Facts":  Decide how many servings you will have.  Multiply the number of servings by the number of carbohydrates per serving.  The resulting number is the total amount of carbohydrates that you will be having. Learning standard serving sizes of other foods  When you eat foods containing carbohydrates that are not packaged or do not include "Nutrition Facts" on the label, you need to measure the servings in order to count the amount of carbohydrates:  Measure the foods that you will eat with a food scale or measuring  cup, if needed.  Decide how many standard-size servings you will eat.  Multiply the number of servings by 15. Most carbohydrate-rich foods have about 15 g of carbohydrates per serving.  For example, if you eat 8 oz (170 g) of strawberries, you will have eaten 2 servings and 30 g of carbohydrates (2 servings x 15 g = 30 g).  For foods that have more than one food mixed, such as soups and casseroles, you must count the carbohydrates in each food that is included. The following list contains standard serving sizes of common carbohydrate-rich foods. Each of these servings has about 15 g of carbohydrates:   hamburger bun or  English muffin.   oz (15 mL) syrup.   oz (14 g) jelly.  1 slice of bread.  1 six-inch tortilla.  3 oz (85 g) cooked rice or pasta.  4 oz (113 g) cooked dried beans.  4 oz (113 g) starchy vegetable, such as peas, corn, or potatoes.  4 oz (113 g) hot cereal.  4 oz (113 g) mashed potatoes or  of a large baked potato.  4 oz (113 g) canned or frozen fruit.  4 oz (120 mL) fruit juice.  4-6 crackers.  6 chicken nuggets.  6 oz (170 g) unsweetened dry cereal.  6 oz (170 g) plain fat-free yogurt or yogurt sweetened with artificial sweeteners.  8 oz (240 mL) milk.  8 oz (170 g) fresh fruit or one small piece of fruit.  24 oz (680 g) popped popcorn. Example of carbohydrate counting Sample meal  3 oz (85 g) chicken breast.  6 oz (  170 g) brown rice.  4 oz (113 g) corn.  8 oz (240 mL) milk.  8 oz (170 g) strawberries with sugar-free whipped topping. Carbohydrate calculation 1. Identify the foods that contain carbohydrates:  Rice.  Corn.  Milk.  Strawberries. 2. Calculate how many servings you have of each food:  2 servings rice.  1 serving corn.  1 serving milk.  1 serving strawberries. 3. Multiply each number of servings by 15 g:  2 servings rice x 15 g = 30 g.  1 serving corn x 15 g = 15 g.  1 serving milk x 15 g = 15  g.  1 serving strawberries x 15 g = 15 g. 4. Add together all of the amounts to find the total grams of carbohydrates eaten:  30 g + 15 g + 15 g + 15 g = 75 g of carbohydrates total. This information is not intended to replace advice given to you by your health care provider. Make sure you discuss any questions you have with your health care provider. Document Released: 07/17/2005 Document Revised: 02/04/2016 Document Reviewed: 12/29/2015 Elsevier Interactive Patient Education  2017 [employer]  

## 2016-12-21 NOTE — Progress Notes (Signed)
Pre visit review using our clinic review tool, if applicable. No additional management support is needed unless otherwise documented below in the visit note. 

## 2016-12-22 ENCOUNTER — Encounter: Payer: Self-pay | Admitting: Family Medicine

## 2016-12-22 ENCOUNTER — Telehealth: Payer: Self-pay | Admitting: Family Medicine

## 2016-12-22 DIAGNOSIS — I10 Essential (primary) hypertension: Secondary | ICD-10-CM

## 2016-12-22 DIAGNOSIS — R739 Hyperglycemia, unspecified: Secondary | ICD-10-CM

## 2016-12-22 DIAGNOSIS — E559 Vitamin D deficiency, unspecified: Secondary | ICD-10-CM

## 2016-12-22 DIAGNOSIS — R300 Dysuria: Secondary | ICD-10-CM

## 2016-12-22 MED ORDER — HYDROCODONE-ACETAMINOPHEN 5-325 MG PO TABS: 1.0000 | ORAL_TABLET | Freq: Four times a day (QID) | ORAL | 0 refills | Status: DC | PRN

## 2016-12-22 NOTE — Telephone Encounter (Signed)
Requesting:   Hydrocodone Contract    08/17/2014 UDS  Moderate---04/16/15 Last OV    12/19/16 Last Refill    #120 no refills on 10/26/2016  Please Advise

## 2016-12-22 NOTE — Telephone Encounter (Signed)
Called left detailed message hardcopy/uds /contract at the front for ready for pickup. Left a detailed message on both cell/home number

## 2016-12-22 NOTE — Telephone Encounter (Signed)
OK to refill but needs UDS, contract

## 2016-12-22 NOTE — Telephone Encounter (Signed)
Caller name: Relationship to patient: Self Can be reached: [phone]  Pharmacy:  Reason for call: Refill HYDROcodone-acetaminophen (NORCO) 5-325 MG tablet [[id number]]

## 2016-12-25 NOTE — Progress Notes (Signed)
Patient ID: Rachael Johnson, female   DOB: 09-22-1950, 66 y.o.   MRN: [id number]   Subjective:    Patient ID: Rachael Johnson, female    DOB: [birth year], 67 y.o.   MRN: [id number]  Chief Complaint  Patient presents with  . Medication Refill    HPI Patient is in today for follow up on numerous medical concerns. She has been eating somewhat better and her weight is stable. She  Continues to struggle with some chronic pain but does get relief with her current meds and staying active. Is noting some head congestion especially out of left nares. No recent febrile illness or hospitalizations. Denies CP/palp/SOB/HA/fevers/GI or GU c/o. Taking meds as prescribed  Past Medical History:  Diagnosis Date  . Acute pharyngitis 07/13/2014  . Allergy   . Anemia, iron deficiency 07/08/2012  . Arthritis   . Cataract 12/21/2014  . Cerumen impaction 04/28/2013  . Chest pain   . Depression   . Depression with anxiety 03/24/2011  . Diverticulitis   . Esophageal reflux 08/17/2013  . Essential thrombocythemia ([place]) 01/24/2011  . Frequent episodic tension-type headache   . Gastroenteritis 12/21/2014  . 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 GERD (gastroesophageal reflux disease)   . H. pylori infection 12/19/2012  . Hyperglycemia 12/17/2015  . Hyperglycemia 12/17/2015  . Hypertension   . Normal cardiac stress test   . Other and unspecified hyperlipidemia 02/25/2013  . Pedal edema 03/07/2016  . Pneumonia 04/29/2016  . Preventative health care 04/20/2013  . Restless leg syndrome 09/30/2014  . Scabies 03/28/2015  . Seizure ([place])    childhood  . Thyroid disease     Past Surgical History:  Procedure Laterality Date  . ABDOMINAL HYSTERECTOMY     1992  . BREAST SURGERY  1992   biopsy, benign. Fibrocystic  . UMBILICAL HERNIA REPAIR N/A 09/25/2012   Procedure: HERNIA REPAIR UMBILICAL ADULT;  Surgeon: Harl Bowie, MD;  Location: [facility];  Service: General;  Laterality: N/A;    Family History  Problem Relation Age of  Onset  . Arthritis Mother   . Cancer Mother        ovarian  . Hypertension Mother   . Heart disease Mother        pacer  . Heart failure Mother   . Arthritis Father   . Cancer Father        lung  . Hypertension Father   . Cancer Sister        lung  . Cancer Brother        prostate  . Cancer Brother        lung  . Hypertension Son   . COPD Brother   . Heart disease Brother   . Hypertension Brother   . Cancer Brother        colon  . Alcohol abuse Brother   . Cirrhosis Brother   . Seizures Sister   . Stroke Sister   . Arthritis Brother     Social History   Social History  . Marital status: Widowed    Spouse name: N/A  . Number of children: 2  . Years of education: N/A   Occupational History  . APL operator [employer]   Social History Main Topics  . Smoking status: Former Smoker    Packs/day: 3.50    Years: 20.00    Types: Cigarettes    Start date: 11/15/1970    Quit date: 07/31/1990  . Smokeless tobacco: Never Used  Comment: quit 23 years ago  . Alcohol use No  . Drug use: No  . Sexual activity: No   Other Topics Concern  . Not on file   Social History Narrative   Regular exercise: no   Caffeine Use: 2-3 weekly    Outpatient Medications Prior to Visit  Medication Sig Dispense Refill  . acetaminophen-codeine (TYLENOL #3) 300-30 MG tablet One every 4 hours as needed for cough 40 tablet 0  . ALPRAZolam (XANAX) 1 MG tablet Take 1 tablet (1 mg total) by mouth 3 (three) times daily as needed for anxiety. 180 tablet 1  . aspirin EC 81 MG tablet Take 81 mg by mouth every morning.    . budesonide-formoterol (SYMBICORT) 80-4.5 MCG/ACT inhaler Inhale 2 puffs into the lungs 2 (two) times daily. 1 Inhaler 5  . Calcium Carbonate-Vitamin D (CALTRATE COLON HEALTH PO) Take by mouth.    . Cranberry 300 MG tablet Take 300 mg by mouth 2 (two) times daily.    . fluorometholone (FML) 0.1 % ophthalmic suspension Place 1 drop into both eyes daily as needed  (For dry eyes.).     Marland Kitchen folic acid (FOLVITE) 1 MG tablet Take 1 mg by mouth daily.    . hydroxyurea (HYDREA) 500 MG capsule TAKE 2 CAPSULES BY MOUTH DAILY 60 capsule 2  . lactulose (CHRONULAC) 10 GM/15ML solution TAKE 2 TABLESPOONFULS (30 ML) BY MOUTH 3 TIMES A DAY FOR CONSTIPATION 1000 mL 8  . meclizine (ANTIVERT) 12.5 MG tablet TAKE 1 TABLET THREE TIMES A DAY AS NEEDED FOR NAUSEA OR DIZZINESS 30 tablet 1  . montelukast (SINGULAIR) 10 MG tablet Take 1 tablet (10 mg total) by mouth at bedtime. 90 tablet 3  . Multiple Vitamin (MULTIVITAMIN PO) Take 1 tablet by mouth every morning.     . ondansetron (ZOFRAN) 8 MG tablet TAKE ONE TABLET BY MOUTH EVERY 8 HOURS AS NEEDED NAUSEA AND VOMITING 20 tablet 0  . Probiotic Product (PROBIOTIC DAILY PO) Take by mouth 2 (two) times daily.    . promethazine (PHENERGAN) 25 MG tablet Take 1 tablet (25 mg total) by mouth every 6 (six) hours as needed for nausea or vomiting. 60 tablet 4  . RESTASIS 0.05 % ophthalmic emulsion Place 1 drop into both eyes 2 (two) times daily.   3  . venlafaxine XR (EFFEXOR-XR) 75 MG 24 hr capsule TAKE ONE CAPSULE BY MOUTH DAILY WITH BREAKFAST 90 capsule 1  . amitriptyline (ELAVIL) 10 MG tablet Take 2 tablets (20 mg total) by mouth at bedtime. 90 tablet 1  . cetirizine (ZYRTEC) 10 MG tablet Take 1 tablet (10 mg total) by mouth daily. 90 tablet 3  . citalopram (CELEXA) 40 MG tablet Take 1 tablet (40 mg total) by mouth every morning. 90 tablet 3  . cyclobenzaprine (FLEXERIL) 10 MG tablet TAKE 1 TABLET (10 MG TOTAL) BY MOUTH 2 (TWO) TIMES DAILY AS NEEDED FOR MUSCLE SPASMS. 60 tablet 3  . estradiol (ESTRACE) 0.5 MG tablet Take 1 tablet (0.5 mg total) by mouth 2 (two) times daily. 180 tablet 1  . fluticasone (FLONASE) 50 MCG/ACT nasal spray Place 2 sprays into both nostrils daily. 48 g 3  . furosemide (LASIX) 20 MG tablet Take 1 tablet (20 mg total) by mouth 2 (two) times daily as needed for fluid or edema. 90 tablet 1  .  HYDROcodone-acetaminophen (NORCO) 5-325 MG tablet Take 1 tablet by mouth every 6 (six) hours as needed for severe pain. Take one tablet by mouth every 6  hours as needed for severe pain 120 tablet 0  . levothyroxine (SYNTHROID, LEVOTHROID) 88 MCG tablet Take 1 tablet (88 mcg total) by mouth daily. 30 tablet 5  . naproxen (NAPROSYN) 500 MG tablet TAKE 1 TABLET (500 MG TOTAL) BY MOUTH 2 (TWO) TIMES DAILY WITH A MEAL. 180 tablet 1  . omeprazole (PRILOSEC) 40 MG capsule Take 1 capsule (40 mg total) by mouth 2 (two) times daily. 180 capsule 0  . pantoprazole (PROTONIX) 40 MG tablet Take 1 tablet (40 mg total) by mouth daily. 90 tablet 0  . pramipexole (MIRAPEX) 0.25 MG tablet TAKE ONE OR TWO TABLETS BY MOUTH AT BEDTIME AS NEEDED 180 tablet 1  . ranitidine (ZANTAC) 300 MG tablet Take 1 tablet (300 mg total) by mouth at bedtime. 90 tablet 3  . venlafaxine XR (EFFEXOR-XR) 150 MG 24 hr capsule Take 1 capsule (150 mg total) by mouth daily with breakfast. 90 capsule 1  . VENTOLIN HFA 108 (90 Base) MCG/ACT inhaler INHALE 2 PUFFS INTO THE LUNGS EVERY 6 (SIX) HOURS AS NEEDED FOR WHEEZING OR SHORTNESS OF BREATH. 18 Inhaler 8  . Vitamin D, Ergocalciferol, (DRISDOL) 50000 units CAPS capsule Take 1 capsule (50,000 Units total) by mouth every 7 (seven) days. 12 capsule 0   No facility-administered medications prior to visit.     No Known Allergies  Review of Systems  Constitutional: Positive for malaise/fatigue. Negative for fever.  HENT: Negative for congestion.   Eyes: Negative for blurred vision.  Respiratory: Negative for shortness of breath.   Cardiovascular: Negative for chest pain, palpitations and leg swelling.  Gastrointestinal: Negative for abdominal pain, blood in stool and nausea.  Genitourinary: Negative for dysuria and frequency.  Musculoskeletal: Negative for falls.  Skin: Negative for rash.  Neurological: Negative for dizziness, loss of consciousness and headaches.  Endo/Heme/Allergies:  Negative for environmental allergies.  Psychiatric/Behavioral: Negative for depression. The patient is not nervous/anxious.        Objective:    Physical Exam  Constitutional: She is oriented to person, place, and time. She appears well-developed and well-nourished. No distress.  HENT:  Head: Normocephalic and atraumatic.  Nose: Nose normal.  Eyes: Right eye exhibits no discharge. Left eye exhibits no discharge.  Neck: Normal range of motion. Neck supple.  Cardiovascular: Normal rate and regular rhythm.   No murmur heard. Pulmonary/Chest: Effort normal and breath sounds normal.  Abdominal: Soft. Bowel sounds are normal. There is no tenderness.  Musculoskeletal: She exhibits no edema.  Neurological: She is alert and oriented to person, place, and time.  Skin: Skin is warm and dry.  Psychiatric: She has a normal mood and affect.  Nursing note and vitals reviewed.   BP 134/64 (BP Location: Left Arm, Patient Position: Sitting, Cuff Size: Normal)   Pulse 96   Temp 98.2 F (36.8 C) (Oral)   Wt 137 lb 6.4 oz (62.3 kg)   LMP 07/31/1990   SpO2 100% Comment: RA  BMI 23.58 kg/m  Wt Readings from Last 3 Encounters:  12/21/16 137 lb 6.4 oz (62.3 kg)  12/18/16 137 lb 6.4 oz (62.3 kg)  11/29/16 137 lb (62.1 kg)     Lab Results  Component Value Date   WBC 4.5 11/29/2016   HGB 11.4 (L) 11/29/2016   HCT 34.5 (L) 11/29/2016   PLT 350 11/29/2016   GLUCOSE 65 (L) 11/29/2016   CHOL 176 06/19/2016   TRIG 70.0 06/19/2016   HDL 69.80 06/19/2016   LDLCALC 92 06/19/2016   ALT 10 11/29/2016  AST 19 11/29/2016   NA 140 11/29/2016   K 4.2 11/29/2016   CL 103 06/19/2016   CREATININE 0.8 11/29/2016   BUN 18.3 11/29/2016   CO2 27 11/29/2016   TSH 2.05 06/19/2016   HGBA1C 5.4 06/19/2016    Lab Results  Component Value Date   TSH 2.05 06/19/2016   Lab Results  Component Value Date   WBC 4.5 11/29/2016   HGB 11.4 (L) 11/29/2016   HCT 34.5 (L) 11/29/2016   MCV 126 (H) 11/29/2016     PLT 350 11/29/2016   Lab Results  Component Value Date   NA 140 11/29/2016   K 4.2 11/29/2016   CHLORIDE 105 11/29/2016   CO2 27 11/29/2016   GLUCOSE 65 (L) 11/29/2016   BUN 18.3 11/29/2016   CREATININE 0.8 11/29/2016   BILITOT <0.22 11/29/2016   ALKPHOS 77 11/29/2016   AST 19 11/29/2016   ALT 10 11/29/2016   PROT 7.5 11/29/2016   ALBUMIN 4.1 11/29/2016   CALCIUM 8.9 11/29/2016   ANIONGAP 8 11/29/2016   EGFR 80 (L) 11/29/2016   GFR 89.43 06/19/2016   Lab Results  Component Value Date   CHOL 176 06/19/2016   Lab Results  Component Value Date   HDL 69.80 06/19/2016   Lab Results  Component Value Date   LDLCALC 92 06/19/2016   Lab Results  Component Value Date   TRIG 70.0 06/19/2016   Lab Results  Component Value Date   CHOLHDL 3 06/19/2016   Lab Results  Component Value Date   HGBA1C 5.4 06/19/2016       Assessment & Plan:   Problem List Items Addressed This Visit    Depression with anxiety    Doing well on Venlafaxine XR continue cureent meds      Hyperglycemia    hgba1c acceptable, minimize simple carbs. Increase exercise as tolerated.      Relevant Medications   furosemide (LASIX) 20 MG tablet   Vitamin D deficiency    Labs reveal deficiency. Start on Vitamin D 60789 IU caps, 1 cap po weekly x 12 weeks. Disp #4 with 4 rf. Also take daily Vitamin D over the counter. If already taking a daily supplement increase by 1000 IU daily and if not start Vitamin D 2000 IU daily.       Relevant Medications   furosemide (LASIX) 20 MG tablet   Dysuria   Relevant Medications   furosemide (LASIX) 20 MG tablet   Essential hypertension    Well controlled, no changes to meds. Encouraged heart healthy diet such as the DASH diet and exercise as tolerated.       Relevant Medications   furosemide (LASIX) 20 MG tablet      I have discontinued Ms. Guillen's amitriptyline, pantoprazole, and amitriptyline. I have also changed her VENTOLIN HFA to albuterol.  Additionally, I am having her start on benzonatate, silver sulfADIAZINE, and amitriptyline. Lastly, I am having her maintain her Multiple Vitamin (MULTIVITAMIN PO), fluorometholone, aspirin EC, Probiotic Product (PROBIOTIC DAILY PO), folic acid, Calcium Carbonate-Vitamin D (CALTRATE COLON HEALTH PO), Cranberry, RESTASIS, ondansetron, promethazine, lactulose, ALPRAZolam, meclizine, venlafaxine XR, montelukast, hydroxyurea, acetaminophen-codeine, budesonide-formoterol, SYMBICORT, levothyroxine, fluticasone, Vitamin D (Ergocalciferol), cyclobenzaprine, omeprazole, ranitidine, cetirizine, pramipexole, estradiol, furosemide, venlafaxine XR, citalopram, and naproxen.  Meds ordered this encounter  Medications  . SYMBICORT 160-4.5 MCG/ACT inhaler    Sig: INHALE 2 PUFFS INTO THE LUNGS 2 (TWO) TIMES DAILY.    Refill:  1  . albuterol (VENTOLIN HFA) 108 (90 Base) MCG/ACT inhaler  Sig: INHALE 2 PUFFS INTO THE LUNGS EVERY 6 (SIX) HOURS AS NEEDED FOR WHEEZING OR SHORTNESS OF BREATH.  Can use Ventolin, ProAir or Proventil whichever cheaper    Dispense:  54 Inhaler    Refill:  3  . DISCONTD: pramipexole (MIRAPEX) 0.25 MG tablet    Sig: TAKE ONE OR TWO TABLETS BY MOUTH AT BEDTIME AS NEEDED    Dispense:  180 tablet    Refill:  1  . benzonatate (TESSALON) 100 MG capsule    Sig: Take 1 capsule (100 mg total) by mouth 2 (two) times daily as needed for cough.    Dispense:  180 capsule    Refill:  1  . silver sulfADIAZINE (SILVADENE) 1 % cream    Sig: Apply 1 application topically 2 (two) times daily.    Dispense:  170 g    Refill:  3  . amitriptyline (ELAVIL) 50 MG tablet    Sig: Take 1 tablet (50 mg total) by mouth at bedtime.    Dispense:  90 tablet    Refill:  1  . DISCONTD: amitriptyline (ELAVIL) 10 MG tablet    Sig: TAKE 2 TABLETS (20 MG TOTAL) BY MOUTH AT BEDTIME.    Refill:  3  . levothyroxine (SYNTHROID, LEVOTHROID) 88 MCG tablet    Sig: Take 1 tablet (88 mcg total) by mouth daily.    Dispense:   90 tablet    Refill:  1  . fluticasone (FLONASE) 50 MCG/ACT nasal spray    Sig: Place 2 sprays into both nostrils daily.    Dispense:  48 g    Refill:  3  . Vitamin D, Ergocalciferol, (DRISDOL) 50000 units CAPS capsule    Sig: Take 1 capsule (50,000 Units total) by mouth every 7 (seven) days.    Dispense:  12 capsule    Refill:  0  . cyclobenzaprine (FLEXERIL) 10 MG tablet    Sig: Take 1 tablet (10 mg total) by mouth 2 (two) times daily as needed for muscle spasms.    Dispense:  60 tablet    Refill:  3  . omeprazole (PRILOSEC) 40 MG capsule    Sig: Take 1 capsule (40 mg total) by mouth 2 (two) times daily.    Dispense:  180 capsule    Refill:  1  . ranitidine (ZANTAC) 300 MG tablet    Sig: Take 1 tablet (300 mg total) by mouth at bedtime.    Dispense:  90 tablet    Refill:  1  . cetirizine (ZYRTEC) 10 MG tablet    Sig: Take 1 tablet (10 mg total) by mouth daily.    Dispense:  90 tablet    Refill:  1  . pramipexole (MIRAPEX) 0.25 MG tablet    Sig: TAKE ONE OR TWO TABLETS BY MOUTH AT BEDTIME AS NEEDED    Dispense:  180 tablet    Refill:  1  . estradiol (ESTRACE) 0.5 MG tablet    Sig: Take 1 tablet (0.5 mg total) by mouth 2 (two) times daily.    Dispense:  180 tablet    Refill:  1  . furosemide (LASIX) 20 MG tablet    Sig: Take 1 tablet (20 mg total) by mouth 2 (two) times daily as needed for fluid or edema.    Dispense:  90 tablet    Refill:  1  . venlafaxine XR (EFFEXOR-XR) 150 MG 24 hr capsule    Sig: Take 1 capsule (150 mg total) by mouth daily with breakfast.  Dispense:  90 capsule    Refill:  1  . citalopram (CELEXA) 40 MG tablet    Sig: Take 1 tablet (40 mg total) by mouth every morning.    Dispense:  90 tablet    Refill:  3  . naproxen (NAPROSYN) 500 MG tablet    Sig: TAKE 1 TABLET (500 MG TOTAL) BY MOUTH 2 (TWO) TIMES DAILY WITH A MEAL.    Dispense:  180 tablet    Refill:  1     Penni Homans, MD

## 2016-12-25 NOTE — Assessment & Plan Note (Signed)
Labs reveal deficiency. Start on Vitamin D 50000 IU caps, 1 cap po weekly x 12 weeks. Disp #4 with 4 rf. Also take daily Vitamin D over the counter. If already taking a daily supplement increase by 1000 IU daily and if not start Vitamin D 2000 IU daily.  

## 2016-12-25 NOTE — Assessment & Plan Note (Signed)
Well controlled, no changes to meds. Encouraged heart healthy diet such as the DASH diet and exercise as tolerated.  °

## 2016-12-25 NOTE — Assessment & Plan Note (Signed)
Doing well on Venlafaxine XR continue cureent meds

## 2016-12-25 NOTE — Assessment & Plan Note (Signed)
hgba1c acceptable, minimize simple carbs. Increase exercise as tolerated.  

## 2016-12-26 ENCOUNTER — Encounter: Payer: Self-pay | Admitting: Family Medicine

## 2017-01-11 ENCOUNTER — Ambulatory Visit (HOSPITAL_BASED_OUTPATIENT_CLINIC_OR_DEPARTMENT_OTHER)
Admission: RE | Admit: 2017-01-11 | Discharge: 2017-01-11 | Disposition: A | Discharge status: Home / Self Care | Payer: BLUE CROSS/BLUE SHIELD | Source: Ambulatory Visit | Attending: Adult Health | Admitting: Adult Health

## 2017-01-11 ENCOUNTER — Other Ambulatory Visit (HOSPITAL_BASED_OUTPATIENT_CLINIC_OR_DEPARTMENT_OTHER): Payer: BLUE CROSS/BLUE SHIELD

## 2017-01-11 ENCOUNTER — Telehealth: Payer: Self-pay | Admitting: *Deleted

## 2017-01-11 ENCOUNTER — Ambulatory Visit (HOSPITAL_BASED_OUTPATIENT_CLINIC_OR_DEPARTMENT_OTHER): Payer: BLUE CROSS/BLUE SHIELD | Admitting: Hematology & Oncology

## 2017-01-11 VITALS — BP 128/73 | HR 82 | Temp 98.3°F | Resp 18 | Wt 136.0 lb

## 2017-01-11 DIAGNOSIS — R918 Other nonspecific abnormal finding of lung field: Secondary | ICD-10-CM | POA: Diagnosis not present

## 2017-01-11 DIAGNOSIS — I700 Atherosclerosis of aorta: Secondary | ICD-10-CM | POA: Insufficient documentation

## 2017-01-11 DIAGNOSIS — D473 Essential (hemorrhagic) thrombocythemia: Secondary | ICD-10-CM | POA: Diagnosis not present

## 2017-01-11 DIAGNOSIS — E039 Hypothyroidism, unspecified: Secondary | ICD-10-CM

## 2017-01-11 DIAGNOSIS — R938 Abnormal findings on diagnostic imaging of other specified body structures: Secondary | ICD-10-CM | POA: Diagnosis not present

## 2017-01-11 DIAGNOSIS — R911 Solitary pulmonary nodule: Secondary | ICD-10-CM | POA: Diagnosis present

## 2017-01-11 LAB — CBC WITH DIFFERENTIAL (CANCER CENTER ONLY)
BASO#: 0 10*3/uL (ref 0.0–0.2)
BASO%: 0.7 % (ref 0.0–2.0)
EOS%: 4 % (ref 0.0–7.0)
Eosinophils Absolute: 0.1 10*3/uL (ref 0.0–0.5)
HCT: 32.5 % — ABNORMAL LOW (ref 34.8–46.6)
HGB: 10.8 g/dL — ABNORMAL LOW (ref 11.6–15.9)
LYMPH#: 0.7 10*3/uL — ABNORMAL LOW (ref 0.9–3.3)
LYMPH%: 24.7 % (ref 14.0–48.0)
MCH: 42.2 pg — ABNORMAL HIGH (ref 26.0–34.0)
MCHC: 33.2 g/dL (ref 32.0–36.0)
MCV: 127 fL — ABNORMAL HIGH (ref 81–101)
MONO#: 0.2 10*3/uL (ref 0.1–0.9)
MONO%: 7.4 % (ref 0.0–13.0)
NEUT#: 1.9 10*3/uL (ref 1.5–6.5)
NEUT%: 63.2 % (ref 39.6–80.0)
Platelets: 286 10*3/uL (ref 145–400)
RBC: 2.56 10*6/uL — ABNORMAL LOW (ref 3.70–5.32)
RDW: 13.9 % (ref 11.1–15.7)
WBC: 3 10*3/uL — ABNORMAL LOW (ref 3.9–10.0)

## 2017-01-11 LAB — CMP (CANCER CENTER ONLY)
ALT(SGPT): 21 U/L (ref 10–47)
AST: 25 U/L (ref 11–38)
Albumin: 3.7 g/dL (ref 3.3–5.5)
Alkaline Phosphatase: 61 U/L (ref 26–84)
BUN, Bld: 13 mg/dL (ref 7–22)
CO2: 31 mEq/L (ref 18–33)
Calcium: 8.7 mg/dL (ref 8.0–10.3)
Chloride: 103 mEq/L (ref 98–108)
Creat: 0.9 mg/dl (ref 0.6–1.2)
Glucose, Bld: 116 mg/dL (ref 73–118)
Potassium: 3.5 mEq/L (ref 3.3–4.7)
Sodium: 140 mEq/L (ref 128–145)
Total Bilirubin: 0.5 mg/dl (ref 0.20–1.60)
Total Protein: 7.1 g/dL (ref 6.4–8.1)

## 2017-01-11 LAB — LACTATE DEHYDROGENASE: LDH: 199 U/L (ref 125–245)

## 2017-01-11 LAB — IRON AND TIBC
%SAT: 40 % (ref 21–57)
Iron: 83 ug/dL (ref 41–142)
TIBC: 208 ug/dL — ABNORMAL LOW (ref 236–444)
UIBC: 125 ug/dL (ref 120–384)

## 2017-01-11 LAB — FERRITIN: Ferritin: 318 ng/ml — ABNORMAL HIGH (ref 9–269)

## 2017-01-11 NOTE — Telephone Encounter (Addendum)
Patient is aware of results  ----- Message from [name], MD sent at 01/11/2017 12:05 PM EDT ----- Call - iron level is ok!!  pete

## 2017-01-11 NOTE — Progress Notes (Signed)
Hematology and Oncology Follow Up Visit  Rachael Johnson [id number] 01/27/1951 66 y.o. 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 2. Intermittent iron-deficiency anemia.  Current Therapy:   Hydrea 1000/1000/500 mg by mouth daily Aspirin 81 mg by mouth daily IV iron as indicated - last dose given on 02/22/2016      Interim History:  Rachael Johnson is back for followup. She does not feel that great this morning. She's not sure what to be going on. She said that she has these moments every now and then. She is going for a CT scan of her chest today. This is being ordered by Dr. Shyrl Numbers.  She just feels tired. She has a little bit of nausea.  I think she is on way too many medications. Unfortunately, is not much I can do to try to cut these back.  Her last iron studies back in early May showed a ferritin of 299 with iron saturation of 20%. We will have to watch her iron studies closely.  There is no fever. She's had no rashes.  As always, she comes in with a lot of jewelry.    She did have a nice time at the beach back in early May. She, however, thought that she may have gotten food poisoning from a local restaurant.   Overall, her performance status is ECOG 1.   Medications:  Current Outpatient Prescriptions:  .  albuterol (VENTOLIN HFA) 108 (90 Base) MCG/ACT inhaler, INHALE 2 PUFFS INTO THE LUNGS EVERY 6 (SIX) HOURS AS NEEDED FOR WHEEZING OR SHORTNESS OF BREATH.  Can use Ventolin, ProAir or Proventil whichever cheaper, Disp: 54 Inhaler, Rfl: 3 .  ALPRAZolam (XANAX) 1 MG tablet, Take 1 tablet (1 mg total) by mouth 3 (three) times daily as needed for anxiety., Disp: 180 tablet, Rfl: 1 .  aspirin EC 81 MG tablet, Take 81 mg by mouth every morning., Disp: , Rfl:  .  benzonatate (TESSALON) 200 MG capsule, Take 200 mg by mouth as needed., Disp: , Rfl: 1 .  budesonide-formoterol (SYMBICORT) 80-4.5 MCG/ACT inhaler, Inhale 2 puffs into the lungs 2 (two) times  daily., Disp: 1 Inhaler, Rfl: 5 .  Calcium Carbonate-Vitamin D (CALTRATE COLON HEALTH PO), Take by mouth., Disp: , Rfl:  .  cetirizine (ZYRTEC) 10 MG tablet, Take 1 tablet (10 mg total) by mouth daily., Disp: 90 tablet, Rfl: 1 .  citalopram (CELEXA) 40 MG tablet, Take 1 tablet (40 mg total) by mouth every morning., Disp: 90 tablet, Rfl: 3 .  Cranberry 300 MG tablet, Take 300 mg by mouth 2 (two) times daily., Disp: , Rfl:  .  cyclobenzaprine (FLEXERIL) 10 MG tablet, Take 1 tablet (10 mg total) by mouth 2 (two) times daily as needed for muscle spasms., Disp: 60 tablet, Rfl: 3 .  estradiol (ESTRACE) 0.5 MG tablet, Take 1 tablet (0.5 mg total) by mouth 2 (two) times daily., Disp: 180 tablet, Rfl: 1 .  fluorometholone (FML) 0.1 % ophthalmic suspension, Place 1 drop into both eyes daily as needed (For dry eyes.). , Disp: , Rfl:  .  fluticasone (FLONASE) 50 MCG/ACT nasal spray, Place 2 sprays into both nostrils daily., Disp: 48 g, Rfl: 3 .  folic acid (FOLVITE) 1 MG tablet, Take 1 mg by mouth daily., Disp: , Rfl:  .  furosemide (LASIX) 20 MG tablet, Take 1 tablet (20 mg total) by mouth 2 (two) times daily as needed for fluid or edema., Disp: 90 tablet, Rfl: 1 .  HYDROcodone-acetaminophen (NORCO) 5-325 MG tablet, Take 1 tablet by mouth every 6 (six) hours as needed for severe pain. Take one tablet by mouth every 6 hours as needed for severe pain, Disp: 120 tablet, Rfl: 0 .  hydroxyurea (HYDREA) 500 MG capsule, TAKE 2 CAPSULES BY MOUTH DAILY, Disp: 60 capsule, Rfl: 2 .  lactulose (CHRONULAC) 10 GM/15ML solution, TAKE 2 TABLESPOONFULS (30 ML) BY MOUTH 3 TIMES A DAY FOR CONSTIPATION, Disp: 1000 mL, Rfl: 8 .  levothyroxine (SYNTHROID, LEVOTHROID) 88 MCG tablet, Take 1 tablet (88 mcg total) by mouth daily., Disp: 90 tablet, Rfl: 1 .  meclizine (ANTIVERT) 12.5 MG tablet, TAKE 1 TABLET THREE TIMES A DAY AS NEEDED FOR NAUSEA OR DIZZINESS, Disp: 30 tablet, Rfl: 1 .  montelukast (SINGULAIR) 10 MG tablet, Take 1 tablet  (10 mg total) by mouth at bedtime., Disp: 90 tablet, Rfl: 3 .  Multiple Vitamin (MULTIVITAMIN PO), Take 1 tablet by mouth every morning. , Disp: , Rfl:  .  naproxen (NAPROSYN) 500 MG tablet, TAKE 1 TABLET (500 MG TOTAL) BY MOUTH 2 (TWO) TIMES DAILY WITH A MEAL., Disp: 180 tablet, Rfl: 1 .  omeprazole (PRILOSEC) 40 MG capsule, Take 1 capsule (40 mg total) by mouth 2 (two) times daily., Disp: 180 capsule, Rfl: 1 .  ondansetron (ZOFRAN) 8 MG tablet, TAKE ONE TABLET BY MOUTH EVERY 8 HOURS AS NEEDED NAUSEA AND VOMITING, Disp: 20 tablet, Rfl: 0 .  pramipexole (MIRAPEX) 0.25 MG tablet, TAKE ONE OR TWO TABLETS BY MOUTH AT BEDTIME AS NEEDED, Disp: 180 tablet, Rfl: 1 .  Probiotic Product (PROBIOTIC DAILY PO), Take by mouth 2 (two) times daily., Disp: , Rfl:  .  promethazine (PHENERGAN) 25 MG tablet, Take 1 tablet (25 mg total) by mouth every 6 (six) hours as needed for nausea or vomiting., Disp: 60 tablet, Rfl: 4 .  ranitidine (ZANTAC) 300 MG tablet, Take 1 tablet (300 mg total) by mouth at bedtime., Disp: 90 tablet, Rfl: 1 .  RESTASIS 0.05 % ophthalmic emulsion, Place 1 drop into both eyes 2 (two) times daily. , Disp: , Rfl: 3 .  silver sulfADIAZINE (SILVADENE) 1 % cream, Apply 1 application topically 2 (two) times daily., Disp: 170 g, Rfl: 3 .  SYMBICORT 160-4.5 MCG/ACT inhaler, INHALE 2 PUFFS INTO THE LUNGS 2 (TWO) TIMES DAILY., Disp: , Rfl: 1 .  venlafaxine XR (EFFEXOR-XR) 150 MG 24 hr capsule, Take 1 capsule (150 mg total) by mouth daily with breakfast., Disp: 90 capsule, Rfl: 1 .  venlafaxine XR (EFFEXOR-XR) 75 MG 24 hr capsule, TAKE ONE CAPSULE BY MOUTH DAILY WITH BREAKFAST, Disp: 90 capsule, Rfl: 1 .  Vitamin D, Ergocalciferol, (DRISDOL) 50000 units CAPS capsule, Take 1 capsule (50,000 Units total) by mouth every 7 (seven) days., Disp: 12 capsule, Rfl: 0  Allergies: No Known Allergies  Past Medical History, Surgical history, Social history, and Family History were reviewed and updated.  Review of  Systems: As above  Physical Exam:  weight is 136 lb (61.7 kg). Her oral temperature is 98.3 F (36.8 C). Her blood pressure is 128/73 and her pulse is 82. Her respiration is 18 and oxygen saturation is 100%.   Thin white female. Head and neck exam shows no ocular or oral lesions. She has some slight temporal muscle wasting. There is no adenopathy in her neck. Lungs are clear on the right side. There might be some slight decrease on the left side. No wheezes or crackles are noted bilaterally. Cardiac exam regular rate and rhythm. No  murmurs. Abdomen is soft. She has good bowel sounds. There is no fluid wave. There is no palpable liver or spleen. Back exam shows no tenderness over the spine ribs or hips. Extremities shows mild chronic nonpitting edema. she has good range of motion of her joints. She has a compression sleeve on her right arm. There is no swelling of the right arm. There is no cyanosis of her lower extremities. She has good pulses. . Skin exam no rashes. Neurological exam no focal deficits.  Lab Results  Component Value Date   WBC 3.0 (L) 01/11/2017   HGB 10.8 (L) 01/11/2017   HCT 32.5 (L) 01/11/2017   MCV 127 (H) 01/11/2017   PLT 286 01/11/2017     Chemistry      Component Value Date/Time   NA 140 01/11/2017 0938   NA 140 11/29/2016 0936   K 3.5 01/11/2017 0938   K 4.2 11/29/2016 0936   CL 103 01/11/2017 0938   CO2 31 01/11/2017 0938   CO2 27 11/29/2016 0936   BUN 13 01/11/2017 0938   BUN 18.3 11/29/2016 0936   CREATININE 0.9 01/11/2017 0938   CREATININE 0.8 11/29/2016 0936      Component Value Date/Time   CALCIUM 8.7 01/11/2017 0938   CALCIUM 8.9 11/29/2016 0936   ALKPHOS 61 01/11/2017 0938   ALKPHOS 77 11/29/2016 0936   AST 25 01/11/2017 0938   AST 19 11/29/2016 0936   ALT 21 01/11/2017 0938   ALT 10 11/29/2016 0936   BILITOT 0.50 01/11/2017 0938   BILITOT <0.22 11/29/2016 0936         Impression and Plan: Rachael Johnson is 66 year-old white female with  essential thrombocythemia.  Her platelet count is doing quite well. As such, I don't think we have to make an adjustment with her Hydrea dosage.   We can try to get her through the summertime. I will plan to get her back in 3 months. I think this would be reasonable. She was now is that she come in sooner if there is a problem.    [name], MD 6/14/201810:45 AM

## 2017-01-12 LAB — RETICULOCYTES: Reticulocyte Count: 1.5 % (ref 0.6–2.6)

## 2017-01-15 ENCOUNTER — Other Ambulatory Visit: Payer: Self-pay | Admitting: Hematology & Oncology

## 2017-01-15 DIAGNOSIS — D473 Essential (hemorrhagic) thrombocythemia: Secondary | ICD-10-CM

## 2017-01-17 ENCOUNTER — Ambulatory Visit (HOSPITAL_BASED_OUTPATIENT_CLINIC_OR_DEPARTMENT_OTHER): Payer: BLUE CROSS/BLUE SHIELD

## 2017-01-23 ENCOUNTER — Ambulatory Visit (INDEPENDENT_AMBULATORY_CARE_PROVIDER_SITE_OTHER): Payer: BLUE CROSS/BLUE SHIELD | Admitting: Internal Medicine

## 2017-01-23 ENCOUNTER — Encounter: Payer: Self-pay | Admitting: Internal Medicine

## 2017-01-23 VITALS — BP 108/74 | HR 82 | Ht 64.0 in | Wt 142.8 lb

## 2017-01-23 DIAGNOSIS — J479 Bronchiectasis, uncomplicated: Secondary | ICD-10-CM | POA: Diagnosis not present

## 2017-01-23 DIAGNOSIS — J449 Chronic obstructive pulmonary disease, unspecified: Secondary | ICD-10-CM | POA: Insufficient documentation

## 2017-01-23 DIAGNOSIS — R0602 Shortness of breath: Secondary | ICD-10-CM | POA: Insufficient documentation

## 2017-01-23 DIAGNOSIS — J45991 Cough variant asthma: Secondary | ICD-10-CM

## 2017-01-23 DIAGNOSIS — R0609 Other forms of dyspnea: Secondary | ICD-10-CM

## 2017-01-23 DIAGNOSIS — R0600 Dyspnea, unspecified: Secondary | ICD-10-CM

## 2017-01-23 HISTORY — DX: Chronic obstructive pulmonary disease, unspecified: J44.9

## 2017-01-23 NOTE — Assessment & Plan Note (Signed)
-   Spirometry 11/27/2016  wnl p am symbicort no curvature  Sinus CT 12/14/2016 > neg 01/23/2017  After extensive coaching HFA effectiveness =    75% from a baseline of 25%   Symptoms are markedly disproportionate to objective findings and not clear this is actually much of a  lung problem but pt does appear to have difficult to sort out respiratory symptoms of unknown origin for which  DDX  = almost all start with A and  include Adherence, Ace Inhibitors, Acid Reflux, Active Sinus Disease, Alpha 1 Antitripsin deficiency, Anxiety masquerading as Airways dz,  ABPA,  Allergy(esp in young), Aspiration (esp in elderly), Adverse effects of meds,  Active smokers, A bunch of PE's (a small clot burden can't cause this syndrome unless there is already severe underlying pulm or vascular dz with poor reserve) plus two Bs  = Bronchiectasis and Beta blocker use..and one C= CHF     Adherence is always the initial "prime suspect" and is a multilayered concern that requires a "trust but verify" approach in every patient - starting with knowing how to use medications, especially inhalers, correctly, keeping up with refills and understanding the fundamental difference between maintenance and prns vs those medications only taken for a very short course and then stopped and not refilled.  - ? Using med calendar as intended - see hfa teaching  ? Acid (or non-acid) GERD > always difficult to exclude as up to 75% of pts in some series report no assoc GI/ Heartburn symptoms> rec continue max (24h)  acid suppression and diet restrictions/ reviewed     ? Allergy/ asthma > continue singulair/ mild doses of ICS since has bronchiectasis with ? mai  ? Active sinus dz > excluded but could still have pnds > try 1st gen h1 per guidelines  ? Bronchiectasis (see separate a/p) > this cough is not typical at all  I had an extended discussion with the patient reviewing all relevant studies completed to date and  lasting 25 minutes of a 40   minute office  visit addressing  severe non-specific but potentially very serious refractory respiratory symptoms of uncertain and potentially multiple  etiologies.   Each maintenance medication was reviewed in detail including most importantly the difference between maintenance and prns and under what circumstances the prns are to be triggered using an action plan format that is not reflected in the computer generated alphabetically organized AVS but trather by a customized med calendar that reflects the AVS meds with confirmed 100% correlation.   In addition, Please see AVS for unique instructions that I personally wrote and verbalized to the the pt in detail and then reviewed with pt  by my nurse highlighting any  changes in therapy recommended at today's visit to their plan of care.

## 2017-01-23 NOTE — Assessment & Plan Note (Signed)
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 2. Larger nodules seen in the right upper lobe on the prior study are far less distinct on today's examination, likely part of the same underlying chronic infectious process.  Cough is dry/ shrill upper airway not worse in am's all strongly favor alternate explanation than MAI so will focus on rx  Of cough first and f/u in 6 weeks

## 2017-01-23 NOTE — Patient Instructions (Addendum)
Work on inhaler technique:  relax and gently blow all the way out then take a nice smooth deep breath back in, triggering the inhaler at same time you start breathing in.  Hold for up to 5 seconds if you can. Blow out thru nose. Rinse and gargle with water when done    No change in medications except go ahead and use the tessalon up three times a day and at bedtime   take CHLORPHENIRAMINE  4 mg -  available over the counter- may cause drowsiness so start with just a bedtime dose x  two and see how you tolerate it before trying in daytime     Please schedule a follow up office visit in 6 weeks, call sooner if needed with full pfts that day and cxr

## 2017-01-23 NOTE — Progress Notes (Signed)
Subjective:     Patient ID: Rachael Johnson, female   DOB: [birth year],   MRN: [id number]    Brief patient profile:  89 yowf quit smoking  1992 with onset of breathing problems spring 2017 and gradually worse since then assoc with harsh coughing fits referred to pulmonary clinic 11/27/2016 by Dr   Fayrene Helper with CT scan bronchiecatsis/ MPN's  10/21/16    History of Present Illness  11/27/2016 1st [facility] office visit/ [name]   Chief Complaint  Patient presents with  . Pulmonary Consult    Referred by Dr. [name] for eval of abnormal ct chest. She c/o SOB and cough for the past 6-8 months. Her cough is non prod. She states occ she gets SOB walking short distances such as from room to room at home.   only doubled hydrea on 10/17/16 and worse since then with severe day > noct  Dry coughing fits to point of gagging and doe x across the room not reproduced at office today.  Onset originally was indolent/ pattern has been progressively worse esp since last month or so but does not connect the change in hydrea with her symptoms.  On 3 pages of meds with very poor insight on how / when to use them and reports no better with symbicort/singulair/ saba   rec Permanently stop fish oil Continue symbicort but try the 80 strength Take 2 puffs first thing in am and then another 2 puffs about 12 hours later.  Work on inhaler technique:  Protonix (prilosec same thing so take one or the other but not both)  40 mg Take 30- 60 min before your first and last meals of the day and also zantac at bedtime  Take delsym two tsp every 12 hours and supplement if needed with  Tylenol #3  up to 2 every 4 hours Once you have eliminated the cough for 3 straight days try reducing the tylenol #3   first,  then the delsym as tolerated.    01/23/2017  Extended  f/u ov/[name] re: bronchiectasis/ probable mai on symb 80 2bid but very poor hfa  Chief Complaint  Patient presents with  . Follow-up    Pt here to discuss her  CT scan results, She feels like her breathing is not getting better, she is having increase sob, she has a non productive, slight wheezing, Denies chest tightness,fever,  She states she has needed her rescue inhaler   no change doe = room to room/ can't lie back due to cough/ breathing difficulty  Sleep on 3-4 pillows x 1.5 y lots of cough off esp immediately on lying down but dry/ better with tessilon when uses it Ventolin helps breathing some though hfa quite poor  No obvious day to day or daytime variability or assoc excess/ purulent sputum or mucus plugs or hemoptysis or cp or chest tightness, subjective wheeze or overt sinus or hb symptoms. No unusual exp hx or h/o childhood pna/ asthma or knowledge of premature birth.  Sleeping s  need for noct saba. Also denies any obvious fluctuation of symptoms with weather or environmental changes or other aggravating or alleviating factors except as outlined above   Current Medications, Allergies, Complete Past Medical History, Past Surgical History, Family History, and Social History were reviewed in [employer] record.  ROS  The following are not active complaints unless bolded sore throat, dysphagia, dental problems, itching, sneezing,  nasal congestion or excess/ purulent secretions, ear ache,   fever,  chills, sweats, unintended wt loss, classically pleuritic or exertional cp,  orthopnea pnd or leg swelling, presyncope, palpitations, abdominal pain, anorexia, nausea, vomiting, diarrhea  or change in bowel or bladder habits, change in stools or urine, dysuria,hematuria,  rash, arthralgias, visual complaints, headache, numbness, weakness or ataxia or problems with walking or coordination,  change in mood/affect or memory.              Objective:   Physical Exam    Anxious wf  / hopeless helpless affect and attitude and dry barking quality cough   01/23/2017        142   11/27/16 140 lb (63.5 kg)  10/26/16 135 lb 3.2 oz  (61.3 kg)  10/17/16 129 lb (58.5 kg)    Vital signs reviewed  - Note on arrival 02 sats  98% on RA      HEENT: nl dentition, turbinates bilaterally, and oropharynx. Nl external ear canals without cough reflex   NECK :  without JVD/Nodes/TM/ nl carotid upstrokes bilaterally   LUNGS: no acc muscle use,  Nl contour chest with minimal ins and exp rhonchi bilaterally and no cough on insp or exp elicited    CV:  RRR  no s3 or murmur or increase in P2, and no edema   ABD:  soft and nontender with nl inspiratory excursion in the supine position. No bruits or organomegaly appreciated, bowel sounds nl  MS:  Nl gait/ ext warm without deformities, calf tenderness, cyanosis or clubbing No obvious joint restrictions   SKIN: warm and dry without lesions    NEURO:  alert, approp, nl sensorium with  no motor or cerebellar deficits apparent.    CXR PA and Lateral:   11/27/2016 :    I personally reviewed images and agree with radiology impression as follows:   Persistent mild interstitial prominence especially in the lingula and right middle lobe. Given the findings on the March 24th CT scan proceeding with the previously recommended follow-up CT scan would be the most useful next imaging step.     I personally reviewed images and agree with radiology impression as follows:   Chest CT s contrast 01/11/17 1. 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 2. Larger nodules seen in the right upper lobe on the prior study are far less distinct on today's examination, likely part of the same underlying chronic infectious process        Assessment:

## 2017-01-23 NOTE — Assessment & Plan Note (Signed)
01/23/2017   Walked RA  2 laps @ 185 ft each stopped due to  Cough > > sob/ no desat nl pace  Clearly no limited by sob but cough (see separate a/p)

## 2017-02-03 ENCOUNTER — Other Ambulatory Visit: Payer: Self-pay | Admitting: Hematology & Oncology

## 2017-02-03 DIAGNOSIS — D473 Essential (hemorrhagic) thrombocythemia: Secondary | ICD-10-CM

## 2017-02-09 ENCOUNTER — Other Ambulatory Visit: Payer: Self-pay | Admitting: Family Medicine

## 2017-02-12 ENCOUNTER — Ambulatory Visit (INDEPENDENT_AMBULATORY_CARE_PROVIDER_SITE_OTHER): Payer: Medicare Other | Admitting: Family Medicine

## 2017-02-12 DIAGNOSIS — D539 Nutritional anemia, unspecified: Secondary | ICD-10-CM

## 2017-02-12 DIAGNOSIS — E559 Vitamin D deficiency, unspecified: Secondary | ICD-10-CM

## 2017-02-12 DIAGNOSIS — R600 Localized edema: Secondary | ICD-10-CM

## 2017-02-12 DIAGNOSIS — I10 Essential (primary) hypertension: Secondary | ICD-10-CM

## 2017-02-12 DIAGNOSIS — D473 Essential (hemorrhagic) thrombocythemia: Secondary | ICD-10-CM

## 2017-02-12 DIAGNOSIS — E785 Hyperlipidemia, unspecified: Secondary | ICD-10-CM | POA: Diagnosis not present

## 2017-02-12 DIAGNOSIS — J479 Bronchiectasis, uncomplicated: Secondary | ICD-10-CM | POA: Diagnosis not present

## 2017-02-12 DIAGNOSIS — E039 Hypothyroidism, unspecified: Secondary | ICD-10-CM | POA: Diagnosis not present

## 2017-02-12 DIAGNOSIS — D508 Other iron deficiency anemias: Secondary | ICD-10-CM | POA: Diagnosis not present

## 2017-02-12 DIAGNOSIS — R300 Dysuria: Secondary | ICD-10-CM

## 2017-02-12 DIAGNOSIS — K5901 Slow transit constipation: Secondary | ICD-10-CM

## 2017-02-12 DIAGNOSIS — K219 Gastro-esophageal reflux disease without esophagitis: Secondary | ICD-10-CM

## 2017-02-12 DIAGNOSIS — R112 Nausea with vomiting, unspecified: Secondary | ICD-10-CM | POA: Diagnosis not present

## 2017-02-12 DIAGNOSIS — R739 Hyperglycemia, unspecified: Secondary | ICD-10-CM

## 2017-02-12 MED ORDER — CETIRIZINE HCL 10 MG PO TABS: 10.0000 mg | ORAL_TABLET | Freq: Every day | ORAL | 1 refills | Status: DC

## 2017-02-12 MED ORDER — VITAMIN D (ERGOCALCIFEROL) 1.25 MG (50000 UNIT) PO CAPS: 50000.0000 [IU] | ORAL_CAPSULE | ORAL | 1 refills | Status: DC

## 2017-02-12 MED ORDER — ESTRADIOL 0.5 MG PO TABS: 0.5000 mg | ORAL_TABLET | Freq: Two times a day (BID) | ORAL | 1 refills | Status: DC

## 2017-02-12 MED ORDER — PRAMIPEXOLE DIHYDROCHLORIDE 0.25 MG PO TABS: ORAL_TABLET | ORAL | 1 refills | Status: DC

## 2017-02-12 MED ORDER — ONDANSETRON HCL 8 MG PO TABS: ORAL_TABLET | ORAL | 1 refills | Status: DC

## 2017-02-12 MED ORDER — RANITIDINE HCL 300 MG PO TABS: 300.0000 mg | ORAL_TABLET | Freq: Every day | ORAL | 1 refills | Status: DC

## 2017-02-12 MED ORDER — HYDROCODONE-ACETAMINOPHEN 5-325 MG PO TABS: 1.0000 | ORAL_TABLET | Freq: Four times a day (QID) | ORAL | 0 refills | Status: DC | PRN

## 2017-02-12 MED ORDER — MECLIZINE HCL 12.5 MG PO TABS: ORAL_TABLET | ORAL | 1 refills | Status: DC

## 2017-02-12 MED ORDER — NAPROXEN 500 MG PO TABS: ORAL_TABLET | ORAL | 1 refills | Status: DC

## 2017-02-12 MED ORDER — LACTULOSE 10 GM/15ML PO SOLN: ORAL | 8 refills | Status: DC

## 2017-02-12 MED ORDER — PANTOPRAZOLE SODIUM 40 MG PO TBEC: 40.0000 mg | DELAYED_RELEASE_TABLET | Freq: Two times a day (BID) | ORAL | 1 refills | Status: DC

## 2017-02-12 MED ORDER — BUSPIRONE HCL 10 MG PO TABS
ORAL_TABLET | ORAL | 1 refills | Status: DC
Start: 2017-02-12 — End: 2017-03-23

## 2017-02-12 MED ORDER — ALPRAZOLAM 0.5 MG PO TABS: ORAL_TABLET | ORAL | 0 refills | Status: DC

## 2017-02-12 MED ORDER — FUROSEMIDE 20 MG PO TABS: ORAL_TABLET | ORAL | 1 refills | Status: DC

## 2017-02-12 MED ORDER — FUROSEMIDE 20 MG PO TABS: 20.0000 mg | ORAL_TABLET | Freq: Two times a day (BID) | ORAL | 1 refills | Status: DC | PRN

## 2017-02-12 MED ORDER — ALPRAZOLAM 1 MG PO TABS: 1.0000 mg | ORAL_TABLET | Freq: Three times a day (TID) | ORAL | 1 refills | Status: DC | PRN

## 2017-02-12 MED ORDER — MONTELUKAST SODIUM 10 MG PO TABS: 10.0000 mg | ORAL_TABLET | Freq: Every day | ORAL | 3 refills | Status: DC

## 2017-02-12 NOTE — Assessment & Plan Note (Signed)
On Levothyroxine, continue to monitor 

## 2017-02-12 NOTE — Assessment & Plan Note (Signed)
Has been seeing pulmonology and has some further studies due next month.  Responds to Albuterol prn

## 2017-02-12 NOTE — Assessment & Plan Note (Signed)
Denies CP/palp/SOB/HA/congestion/fevers/GI or GU c/o. Taking meds as prescribed 

## 2017-02-12 NOTE — Progress Notes (Signed)
Subjective:  I acted as a Education administrator for Dr. Charlett Blake. [name], [place]  Patient ID: Rachael Johnson, female    DOB: 22-Jun-1951, 66 y.o.   MRN: [id number]  No chief complaint on file.   HPI  Patient is in today for a 2 month follow up. She is noting persistent pedal edema. Has not tried compression, only elevates feet above heart for short time. No new concerns. No recent febrile illness. Has been seen by hematology and ppulmonology since last visit and no new concerns. Denies CP/palp/SOB/HA/congestion/fevers/GI or GU c/o. Taking meds as prescribed  Patient Care Team: Mosie Lukes, MD as PCP - General (Family Medicine) Marin Olp Rudell Cobb, MD as Consulting Physician (Oncology) Glenna Fellows, MD as Attending Physician (Neurosurgery)   Past Medical History:  Diagnosis Date  . Acute pharyngitis 07/13/2014  . Allergy   . Anemia, iron deficiency 07/08/2012  . Arthritis   . Cataract 12/21/2014  . Cerumen impaction 04/28/2013  . Chest pain   . Depression   . Depression with anxiety 03/24/2011  . Diverticulitis   . Esophageal reflux 08/17/2013  . Essential thrombocythemia ([place]) 01/24/2011  . Frequent episodic tension-type headache   . Gastroenteritis 12/21/2014  . 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 GERD (gastroesophageal reflux disease)   . H. pylori infection 12/19/2012  . Hyperglycemia 12/17/2015  . Hyperglycemia 12/17/2015  . Hypertension   . Normal cardiac stress test   . Other and unspecified hyperlipidemia 02/25/2013  . Pedal edema 03/07/2016  . Pneumonia 04/29/2016  . Preventative health care 04/20/2013  . Restless leg syndrome 09/30/2014  . Scabies 03/28/2015  . Seizure ([place])    childhood  . Thyroid disease     Past Surgical History:  Procedure Laterality Date  . ABDOMINAL HYSTERECTOMY     1992  . BREAST SURGERY  1992   biopsy, benign. Fibrocystic  . UMBILICAL HERNIA REPAIR N/A 09/25/2012   Procedure: HERNIA REPAIR UMBILICAL ADULT;  Surgeon: Harl Bowie, MD;  Location: [facility];  Service:  General;  Laterality: N/A;    Family History  Problem Relation Age of Onset  . Arthritis Mother   . Cancer Mother        ovarian  . Hypertension Mother   . Heart disease Mother        pacer  . Heart failure Mother   . Arthritis Father   . Cancer Father        lung  . Hypertension Father   . Cancer Sister        lung  . Cancer Brother        prostate  . Cancer Brother        lung  . Hypertension Son   . COPD Brother   . Heart disease Brother   . Hypertension Brother   . Cancer Brother        colon  . Alcohol abuse Brother   . Cirrhosis Brother   . Seizures Sister   . Stroke Sister   . Arthritis Brother     Social History   Social History  . Marital status: Widowed    Spouse name: N/A  . Number of children: 2  . Years of education: N/A   Occupational History  . APL operator [employer]   Social History Main Topics  . Smoking status: Former Smoker    Packs/day: 3.50    Years: 20.00    Types: Cigarettes    Start date: 11/15/1970    Quit  date: 07/31/1990  . Smokeless tobacco: Never Used     Comment: quit 23 years ago  . Alcohol use No  . Drug use: No  . Sexual activity: No   Other Topics Concern  . Not on file   Social History Narrative   Regular exercise: no   Caffeine Use: 2-3 weekly    Outpatient Medications Prior to Visit  Medication Sig Dispense Refill  . albuterol (VENTOLIN HFA) 108 (90 Base) MCG/ACT inhaler INHALE 2 PUFFS INTO THE LUNGS EVERY 6 (SIX) HOURS AS NEEDED FOR WHEEZING OR SHORTNESS OF BREATH.  Can use Ventolin, ProAir or Proventil whichever cheaper 54 Inhaler 3  . aspirin EC 81 MG tablet Take 81 mg by mouth every morning.    . benzonatate (TESSALON) 200 MG capsule Take 200 mg by mouth as needed.  1  . budesonide-formoterol (SYMBICORT) 80-4.5 MCG/ACT inhaler Inhale 2 puffs into the lungs 2 (two) times daily. 1 Inhaler 5  . Calcium Carbonate-Vitamin D (CALTRATE COLON HEALTH PO) Take by mouth.    . citalopram (CELEXA) 40  MG tablet Take 1 tablet (40 mg total) by mouth every morning. 90 tablet 3  . Cranberry 300 MG tablet Take 300 mg by mouth 2 (two) times daily.    . cyclobenzaprine (FLEXERIL) 10 MG tablet Take 1 tablet (10 mg total) by mouth 2 (two) times daily as needed for muscle spasms. 60 tablet 3  . fluorometholone (FML) 0.1 % ophthalmic suspension Place 1 drop into both eyes daily as needed (For dry eyes.).     Marland Kitchen fluticasone (FLONASE) 50 MCG/ACT nasal spray Place 2 sprays into both nostrils daily. (Patient not taking: Reported on 10/05/6576) 48 g 3  . folic acid (FOLVITE) 1 MG tablet Take 1 mg by mouth daily.    . hydroxyurea (HYDREA) 500 MG capsule TAKE 2 CAPSULES BY MOUTH EVERY DAY 60 capsule 1  . levothyroxine (SYNTHROID, LEVOTHROID) 88 MCG tablet TAKE 1 TABLET DAILY 30 tablet 2  . Multiple Vitamin (MULTIVITAMIN PO) Take 1 tablet by mouth every morning.     . Probiotic Product (PROBIOTIC DAILY PO) Take by mouth 2 (two) times daily.    . promethazine (PHENERGAN) 25 MG tablet Take 1 tablet (25 mg total) by mouth every 6 (six) hours as needed for nausea or vomiting. 60 tablet 4  . RESTASIS 0.05 % ophthalmic emulsion Place 1 drop into both eyes 2 (two) times daily.   3  . silver sulfADIAZINE (SILVADENE) 1 % cream Apply 1 application topically 2 (two) times daily. (Patient not taking: Reported on 01/23/2017) 170 g 3  . SYMBICORT 160-4.5 MCG/ACT inhaler INHALE 2 PUFFS INTO THE LUNGS 2 (TWO) TIMES DAILY.  1  . venlafaxine XR (EFFEXOR-XR) 150 MG 24 hr capsule Take 1 capsule (150 mg total) by mouth daily with breakfast. (Patient not taking: Reported on 01/23/2017) 90 capsule 1  . venlafaxine XR (EFFEXOR-XR) 75 MG 24 hr capsule TAKE ONE CAPSULE BY MOUTH DAILY WITH BREAKFAST 90 capsule 1  . ALPRAZolam (XANAX) 1 MG tablet Take 1 tablet (1 mg total) by mouth 3 (three) times daily as needed for anxiety. 180 tablet 1  . cetirizine (ZYRTEC) 10 MG tablet Take 1 tablet (10 mg total) by mouth daily. 90 tablet 1  . estradiol  (ESTRACE) 0.5 MG tablet Take 1 tablet (0.5 mg total) by mouth 2 (two) times daily. 180 tablet 1  . furosemide (LASIX) 20 MG tablet Take 1 tablet (20 mg total) by mouth 2 (two) times daily as needed  for fluid or edema. 90 tablet 1  . HYDROcodone-acetaminophen (NORCO) 5-325 MG tablet Take 1 tablet by mouth every 6 (six) hours as needed for severe pain. Take one tablet by mouth every 6 hours as needed for severe pain 120 tablet 0  . hydroxyurea (HYDREA) 500 MG capsule TAKE 2 CAPSULES BY MOUTH DAILY 60 capsule 2  . lactulose (CHRONULAC) 10 GM/15ML solution TAKE 2 TABLESPOONFULS (30 ML) BY MOUTH 3 TIMES A DAY FOR CONSTIPATION (Patient not taking: Reported on 01/23/2017) 1000 mL 8  . levothyroxine (SYNTHROID, LEVOTHROID) 88 MCG tablet Take 1 tablet (88 mcg total) by mouth daily. 90 tablet 1  . meclizine (ANTIVERT) 12.5 MG tablet TAKE 1 TABLET THREE TIMES A DAY AS NEEDED FOR NAUSEA OR DIZZINESS 30 tablet 1  . montelukast (SINGULAIR) 10 MG tablet Take 1 tablet (10 mg total) by mouth at bedtime. 90 tablet 3  . naproxen (NAPROSYN) 500 MG tablet TAKE 1 TABLET (500 MG TOTAL) BY MOUTH 2 (TWO) TIMES DAILY WITH A MEAL. 180 tablet 1  . omeprazole (PRILOSEC) 40 MG capsule Take 1 capsule (40 mg total) by mouth 2 (two) times daily. 180 capsule 1  . ondansetron (ZOFRAN) 8 MG tablet TAKE ONE TABLET BY MOUTH EVERY 8 HOURS AS NEEDED NAUSEA AND VOMITING (Patient not taking: Reported on 01/23/2017) 20 tablet 0  . pramipexole (MIRAPEX) 0.25 MG tablet TAKE ONE OR TWO TABLETS BY MOUTH AT BEDTIME AS NEEDED 180 tablet 1  . ranitidine (ZANTAC) 300 MG tablet Take 1 tablet (300 mg total) by mouth at bedtime. 90 tablet 1  . Vitamin D, Ergocalciferol, (DRISDOL) 50000 units CAPS capsule Take 1 capsule (50,000 Units total) by mouth every 7 (seven) days. 12 capsule 0   No facility-administered medications prior to visit.     No Known Allergies  Review of Systems  Constitutional: Positive for malaise/fatigue. Negative for fever.    HENT: Negative for congestion.   Eyes: Negative for blurred vision and redness.  Respiratory: Negative for cough and shortness of breath.   Cardiovascular: Positive for leg swelling. Negative for chest pain and palpitations.  Gastrointestinal: Negative for vomiting.  Musculoskeletal: Positive for back pain.  Skin: Negative for rash.  Neurological: Negative for loss of consciousness and headaches.  Psychiatric/Behavioral: The patient is nervous/anxious.        Objective:    Physical Exam  Constitutional: She is oriented to person, place, and time. She appears well-developed and well-nourished. No distress.  HENT:  Head: Normocephalic and atraumatic.  Eyes: Conjunctivae are normal.  Neck: Normal range of motion. No thyromegaly present.  Cardiovascular: Normal rate and regular rhythm.   Pulmonary/Chest: Effort normal and breath sounds normal. She has no wheezes.  Abdominal: Soft. Bowel sounds are normal. There is no tenderness.  Musculoskeletal: Normal range of motion. She exhibits edema. She exhibits no deformity.  1-2 + pedal edema b/l  Neurological: She is alert and oriented to person, place, and time.  Skin: Skin is warm and dry. She is not diaphoretic.  Psychiatric: She has a normal mood and affect.    LMP 07/31/1990  Wt Readings from Last 3 Encounters:  01/23/17 142 lb 12.8 oz (64.8 kg)  01/11/17 136 lb (61.7 kg)  12/21/16 137 lb 6.4 oz (62.3 kg)   BP Readings from Last 3 Encounters:  01/23/17 108/74  01/11/17 128/73  12/21/16 134/64     Immunization History  Administered Date(s) Administered  . Influenza Whole 05/16/2013  . Influenza,inj,Quad PF,36+ Mos 04/02/2015, 04/18/2016  . Influenza-Unspecified 05/31/2014  .  Pneumococcal Conjugate-13 04/04/2013  . Pneumococcal Polysaccharide-23 04/18/2016  . Pneumococcal-Unspecified 04/10/2011  . Td 08/01/2007  . Tdap 07/06/2014  . Zoster 04/28/2013    Health Maintenance  Topic Date Due  . Hepatitis C Screening   1951-06-18  . HIV Screening  01/31/1967  . PAP SMEAR  10/30/2014  . INFLUENZA VACCINE  02/28/2017  . MAMMOGRAM  11/03/2018  . COLONOSCOPY  11/28/2020  . PNA vac Low Risk Adult (2 of 2 - PPSV23) 04/18/2021  . TETANUS/TDAP  07/06/2024  . DEXA SCAN  Completed    Lab Results  Component Value Date   WBC 3.0 (L) 01/11/2017   HGB 10.8 (L) 01/11/2017   HCT 32.5 (L) 01/11/2017   PLT 286 01/11/2017   GLUCOSE 116 01/11/2017   CHOL 176 06/19/2016   TRIG 70.0 06/19/2016   HDL 69.80 06/19/2016   LDLCALC 92 06/19/2016   ALT 21 01/11/2017   AST 25 01/11/2017   NA 140 01/11/2017   K 3.5 01/11/2017   CL 103 01/11/2017   CREATININE 0.9 01/11/2017   BUN 13 01/11/2017   CO2 31 01/11/2017   TSH 2.05 06/19/2016   HGBA1C 5.4 06/19/2016    Lab Results  Component Value Date   TSH 2.05 06/19/2016   Lab Results  Component Value Date   WBC 3.0 (L) 01/11/2017   HGB 10.8 (L) 01/11/2017   HCT 32.5 (L) 01/11/2017   MCV 127 (H) 01/11/2017   PLT 286 01/11/2017   Lab Results  Component Value Date   NA 140 01/11/2017   K 3.5 01/11/2017   CHLORIDE 105 11/29/2016   CO2 31 01/11/2017   GLUCOSE 116 01/11/2017   BUN 13 01/11/2017   CREATININE 0.9 01/11/2017   BILITOT 0.50 01/11/2017   ALKPHOS 61 01/11/2017   AST 25 01/11/2017   ALT 21 01/11/2017   PROT 7.1 01/11/2017   ALBUMIN 3.7 01/11/2017   CALCIUM 8.7 01/11/2017   ANIONGAP 8 11/29/2016   EGFR 80 (L) 11/29/2016   GFR 89.43 06/19/2016   Lab Results  Component Value Date   CHOL 176 06/19/2016   Lab Results  Component Value Date   HDL 69.80 06/19/2016   Lab Results  Component Value Date   LDLCALC 92 06/19/2016   Lab Results  Component Value Date   TRIG 70.0 06/19/2016   Lab Results  Component Value Date   CHOLHDL 3 06/19/2016   Lab Results  Component Value Date   HGBA1C 5.4 06/19/2016         Assessment & Plan:   Problem List Items Addressed This Visit    Hypothyroid    On Levothyroxine, continue to monitor       Macrocytic anemia    Cbc with diff today      Hyperlipidemia    Encouraged heart healthy diet, increase exercise, avoid trans fats, consider a krill oil cap daily      Relevant Medications   furosemide (LASIX) 20 MG tablet   Other Relevant Orders   Lipid panel   RESOLVED: Esophageal reflux    RN blood check note reviewed. Agree with documention and plan.      Relevant Medications   ondansetron (ZOFRAN) 8 MG tablet   meclizine (ANTIVERT) 12.5 MG tablet   ranitidine (ZANTAC) 300 MG tablet   lactulose (CHRONULAC) 10 GM/15ML solution   pantoprazole (PROTONIX) 40 MG tablet   Essential thrombocythemia ([facility])   Relevant Medications   ondansetron (ZOFRAN) 8 MG tablet   meclizine (ANTIVERT) 12.5 MG tablet  ALPRAZolam (XANAX) 0.5 MG tablet   Other Relevant Orders   CBC with Differential/Platelet   Hyperglycemia    hgba1c acceptable, minimize simple carbs. Increase exercise as tolerated.       Relevant Orders   Comprehensive metabolic panel   Hemoglobin A1c   Vitamin D deficiency    Daily supplements encouraged      Relevant Orders   VITAMIN D 25 Hydroxy (Vit-D Deficiency, Fractures)   Dysuria   Essential hypertension    Denies CP/palp/SOB/HA/congestion/fevers/GI or GU c/o. Taking meds as prescribed      Relevant Medications   furosemide (LASIX) 20 MG tablet   Other Relevant Orders   TSH   Comprehensive metabolic panel   Pedal edema    Increase Lasix to 40 mg in am and 20 mg midday, check cmp today and in 2 weeks      Bronchiectasis assoc with MPNs    Has been seeing pulmonology and has some further studies due next month.  Responds to Albuterol prn       Other Visit Diagnoses    Nausea and vomiting, intractability of vomiting not specified, unspecified vomiting type       Other iron deficiency anemias       Constipation by delayed colonic transit       Relevant Medications   lactulose (CHRONULAC) 10 GM/15ML solution      I have discontinued Ms. Costanzo's  ALPRAZolam, omeprazole, furosemide, and ALPRAZolam. I have also changed her furosemide. Additionally, I am having her start on pantoprazole, ALPRAZolam, and busPIRone. Lastly, I am having her maintain her Multiple Vitamin (MULTIVITAMIN PO), fluorometholone, aspirin EC, Probiotic Product (PROBIOTIC DAILY PO), folic acid, Calcium Carbonate-Vitamin D (CALTRATE COLON HEALTH PO), Cranberry, RESTASIS, promethazine, venlafaxine XR, budesonide-formoterol, SYMBICORT, albuterol, silver sulfADIAZINE, fluticasone, cyclobenzaprine, venlafaxine XR, citalopram, benzonatate, hydroxyurea, levothyroxine, ondansetron, meclizine, cetirizine, pramipexole, estradiol, ranitidine, lactulose, montelukast, naproxen, Vitamin D (Ergocalciferol), and HYDROcodone-acetaminophen.  Meds ordered this encounter  Medications  . ondansetron (ZOFRAN) 8 MG tablet    Sig: TAKE ONE TABLET BY MOUTH EVERY 8 HOURS AS NEEDED NAUSEA AND VOMITING    Dispense:  30 tablet    Refill:  1  . meclizine (ANTIVERT) 12.5 MG tablet    Sig: TAKE 1 TABLET THREE TIMES A DAY AS NEEDED FOR NAUSEA OR DIZZINESS    Dispense:  30 tablet    Refill:  1  . DISCONTD: ALPRAZolam (XANAX) 1 MG tablet    Sig: Take 1 tablet (1 mg total) by mouth 3 (three) times daily as needed for anxiety.    Dispense:  180 tablet    Refill:  1    Not to exceed 4 additional fills before 10/15/2016  . DISCONTD: HYDROcodone-acetaminophen (NORCO) 5-325 MG tablet    Sig: Take 1 tablet by mouth every 6 (six) hours as needed for severe pain. Take one tablet by mouth every 6 hours as needed for severe pain    Dispense:  120 tablet    Refill:  0  . cetirizine (ZYRTEC) 10 MG tablet    Sig: Take 1 tablet (10 mg total) by mouth daily.    Dispense:  90 tablet    Refill:  1  . pramipexole (MIRAPEX) 0.25 MG tablet    Sig: TAKE ONE OR TWO TABLETS BY MOUTH AT BEDTIME AS NEEDED    Dispense:  180 tablet    Refill:  1  . estradiol (ESTRACE) 0.5 MG tablet    Sig: Take 1 tablet (0.5 mg total) by  mouth 2 (  two) times daily.    Dispense:  180 tablet    Refill:  1  . ranitidine (ZANTAC) 300 MG tablet    Sig: Take 1 tablet (300 mg total) by mouth at bedtime.    Dispense:  90 tablet    Refill:  1  . lactulose (CHRONULAC) 10 GM/15ML solution    Sig: TAKE 2 TABLESPOONFULS (30 ML) BY MOUTH 3 TIMES A DAY FOR CONSTIPATION    Dispense:  1000 mL    Refill:  8  . montelukast (SINGULAIR) 10 MG tablet    Sig: Take 1 tablet (10 mg total) by mouth at bedtime.    Dispense:  90 tablet    Refill:  3  . DISCONTD: furosemide (LASIX) 20 MG tablet    Sig: Take 1 tablet (20 mg total) by mouth 2 (two) times daily as needed for fluid or edema.    Dispense:  90 tablet    Refill:  1  . naproxen (NAPROSYN) 500 MG tablet    Sig: TAKE 1 TABLET (500 MG TOTAL) BY MOUTH 2 (TWO) TIMES DAILY WITH A MEAL.    Dispense:  180 tablet    Refill:  1  . Vitamin D, Ergocalciferol, (DRISDOL) 50000 units CAPS capsule    Sig: Take 1 capsule (50,000 Units total) by mouth every 7 (seven) days.    Dispense:  12 capsule    Refill:  1  . pantoprazole (PROTONIX) 40 MG tablet    Sig: Take 1 tablet (40 mg total) by mouth 2 (two) times daily.    Dispense:  180 tablet    Refill:  1  . ALPRAZolam (XANAX) 0.5 MG tablet    Sig: 1 tab po tid x 3 days then 1 tab po bid x 3 days then 1 tab po qhs  X 3 days then stop    Dispense:  20 tablet    Refill:  0  . HYDROcodone-acetaminophen (NORCO) 5-325 MG tablet    Sig: Take 1 tablet by mouth every 6 (six) hours as needed for severe pain. Take one tablet by mouth every 6 hours as needed for severe pain    Dispense:  120 tablet    Refill:  0  . busPIRone (BUSPAR) 10 MG tablet    Sig: 10 mg po bid x 7 days then increase to tid    Dispense:  90 tablet    Refill:  1  . furosemide (LASIX) 20 MG tablet    Sig: 40 mg in am and 20 mg in pm    Dispense:  180 tablet    Refill:  1    CMA served as scribe during this visit. History, Physical and Plan performed by medical provider.  Documentation and orders reviewed and attested to.  Penni Homans, MD

## 2017-02-12 NOTE — Assessment & Plan Note (Signed)
Increase Lasix to 40 mg in am and 20 mg midday, check cmp today and in 2 weeks

## 2017-02-12 NOTE — Assessment & Plan Note (Signed)
Cbc with diff today

## 2017-02-12 NOTE — Assessment & Plan Note (Signed)
Encouraged heart healthy diet, increase exercise, avoid trans fats, consider a krill oil cap daily 

## 2017-02-12 NOTE — Patient Instructions (Addendum)
Bronchiectasis is the closing of the lung tissue at the bases  Meclizine is for inner ear Bronchiectasis Bronchiectasis is a condition in which the airways (bronchi) are damaged and widened. This makes it difficult for the lungs to get rid of mucus. As a result, mucus gathers in the airways, and this often leads to lung infections. Infection can cause inflammation in the airways, which may further weaken and damage the bronchi. What are the causes? Bronchiectasis may be present at birth (congenital) or may develop later in life. Sometimes there is no apparent cause. Some common causes include:  Cystic fibrosis.  Recurrent lung infections (such as pneumonia, tuberculosis, or fungal infections).  Foreign bodies or other blockages in the lungs.  Breathing in fluid, food, or other foreign objects (aspiration).  What are the signs or symptoms? Common symptoms include:  A daily cough that brings up mucus and lasts for more than 3 weeks.  Frequent lung infections (such as pneumonia, tuberculosis, or fungal infections).  Shortness of breath and wheezing.  Weakness and fatigue.  How is this diagnosed? Various tests may be done to help diagnose bronchiectasis. Tests may include:  Chest X-rays or CT scans.  Breathing tests to help determine how your lungs are working.  Sputum cultures to check for infection.  Blood tests and other tests to check for related diseases or causes, such as cystic fibrosis.  How is this treated? Treatment varies depending on the severity of the condition. Medicines may be given to loosen the mucus to be coughed up (expectorants), to relax the muscles of the air passages (bronchodilators), or to prevent or treat infections (antibiotics). Physical therapy methods may be recommended to help clear mucus from the lungs. For severe cases, surgery may be done to remove the affected part of the lung. Follow these instructions at home:  Get plenty of rest.  Only  take over-the-counter or prescription medicines as directed by your health care provider. If antibiotic medicines were prescribed, take them as directed. Finish them even if you start to feel better.  Avoid sedatives and antihistamines unless otherwise directed by your health care provider. These medicines tend to thicken the mucus in the lungs.  Perform any breathing exercises or techniques to clear the lungs as directed by your health care provider.  Drink enough fluids to keep your urine clear or pale yellow.  Consider using a cold steam vaporizer or humidifier in your room or home to help loosen secretions.  If the cough is worse at night, try sleeping in a semi-upright position in a recliner or using a couple of pillows.  Avoid cigarette smoke and lung irritants. If you smoke, quit.  Stay inside when pollution and ozone levels are high.  Stay current with vaccinations and immunizations.  Follow up with your health care provider as directed. Contact a health care provider if:  You cough up more thick, discolored mucus (sputum) that is yellow to green in color.  You have a fever or persistent symptoms for more than 2-3 days.  You cannot control your cough and are losing sleep. Get help right away if:  You cough up blood.  You have chest pain or increasing shortness of breath.  You have pain that is getting worse or is uncontrolled with medicines.  You have a fever and your symptoms suddenly get worse. This information is not intended to replace advice given to you by your health care provider. Make sure you discuss any questions you have with your health care  provider. Document Released: 05/14/2007 Document Revised: 12/29/2015 Document Reviewed: 01/22/2013 Elsevier Interactive Patient Education  2017 [employer].  that causes shortness of breath

## 2017-02-12 NOTE — Assessment & Plan Note (Signed)
hgba1c acceptable, minimize simple carbs. Increase exercise as tolerated.  

## 2017-02-12 NOTE — Assessment & Plan Note (Signed)
RN blood check note reviewed. Agree with documention and plan. 

## 2017-02-12 NOTE — Assessment & Plan Note (Signed)
Daily supplements encouraged.  

## 2017-02-13 ENCOUNTER — Telehealth: Payer: Self-pay | Admitting: Family Medicine

## 2017-02-13 LAB — LIPID PANEL
Cholesterol: 161 mg/dL (ref 0–200)
HDL: 66.4 mg/dL (ref >39.00)
LDL Cholesterol: 82 mg/dL (ref 0–99)
NonHDL: 94.36
Total CHOL/HDL Ratio: 2
Triglycerides: 60 mg/dL (ref 0.0–149.0)
VLDL: 12 mg/dL (ref 0.0–40.0)

## 2017-02-13 LAB — COMPREHENSIVE METABOLIC PANEL
ALT: 10 U/L (ref 0–35)
AST: 20 U/L (ref 0–37)
Albumin: 4 g/dL (ref 3.5–5.2)
Alkaline Phosphatase: 60 U/L (ref 39–117)
BUN: 15 mg/dL (ref 6–23)
CO2: 26 mEq/L (ref 19–32)
Calcium: 9.2 mg/dL (ref 8.4–10.5)
Chloride: 104 mEq/L (ref 96–112)
Creatinine, Ser: 0.72 mg/dL (ref 0.40–1.20)
GFR: 86.39 mL/min (ref >60.00)
Glucose, Bld: 66 mg/dL — ABNORMAL LOW (ref 70–99)
Potassium: 4.5 mEq/L (ref 3.5–5.1)
Sodium: 137 mEq/L (ref 135–145)
Total Bilirubin: 0.3 mg/dL (ref 0.2–1.2)
Total Protein: 7 g/dL (ref 6.0–8.3)

## 2017-02-13 LAB — HEMOGLOBIN A1C: Hgb A1c MFr Bld: 5.3 % (ref 4.6–6.5)

## 2017-02-13 LAB — CBC WITH DIFFERENTIAL/PLATELET
Basophils Absolute: 0 10*3/uL (ref 0.0–0.1)
Basophils Relative: 1.4 % (ref 0.0–3.0)
Eosinophils Absolute: 0.1 10*3/uL (ref 0.0–0.7)
Eosinophils Relative: 4.6 % (ref 0.0–5.0)
HCT: 32.6 % — ABNORMAL LOW (ref 36.0–46.0)
Hemoglobin: 10.7 g/dL — ABNORMAL LOW (ref 12.0–15.0)
Lymphocytes Relative: 35.4 % (ref 12.0–46.0)
Lymphs Abs: 1 10*3/uL (ref 0.7–4.0)
MCHC: 32.8 g/dL (ref 30.0–36.0)
MCV: 129 fl — ABNORMAL HIGH (ref 78.0–100.0)
Monocytes Absolute: 0.3 10*3/uL (ref 0.1–1.0)
Monocytes Relative: 11.5 % (ref 3.0–12.0)
Neutro Abs: 1.3 10*3/uL — ABNORMAL LOW (ref 1.4–7.7)
Neutrophils Relative %: 47.1 % (ref 43.0–77.0)
Platelets: 490 10*3/uL — ABNORMAL HIGH (ref 150.0–400.0)
RBC: 2.53 Mil/uL — ABNORMAL LOW (ref 3.87–5.11)
RDW: 15.5 % (ref 11.5–15.5)
WBC: 2.8 10*3/uL — ABNORMAL LOW (ref 4.0–10.5)

## 2017-02-13 LAB — TSH: TSH: 2.26 u[IU]/mL (ref 0.35–4.50)

## 2017-02-13 LAB — VITAMIN D 25 HYDROXY (VIT D DEFICIENCY, FRACTURES): VITD: 72.01 ng/mL (ref 30.00–100.00)

## 2017-02-13 NOTE — Telephone Encounter (Signed)
Called left message to call back 

## 2017-02-13 NOTE — Telephone Encounter (Signed)
Patient informed of PCP instructions. 

## 2017-02-13 NOTE — Telephone Encounter (Signed)
Pt is returning call back °

## 2017-02-13 NOTE — Telephone Encounter (Signed)
Caller name:Georgianne Tufano Relationship to patient: Can be reached:[phone] Pharmacy[facility] [place]  Reason for call:unable to afford symbicort, requesting something else be called in

## 2017-02-13 NOTE — Telephone Encounter (Signed)
She will have to check with her pharmacy and insurance and see what else is in her forumlary for a better price and we can change

## 2017-02-19 ENCOUNTER — Telehealth: Payer: Self-pay | Admitting: Family Medicine

## 2017-02-19 NOTE — Telephone Encounter (Signed)
Caller name: Ariyona Relation to pt: self  Call back number:  Pharmacy: [id number] [place], [place]  Reason for call: Pt wanting to inform 10 of her meds were sent to mail pharmacy order and pt does not want to mail order (please cancel the mail order), she would like for her rx to be [facility] to [id number] [place], [place] like always. Please advise ASAP, if any question please call pt.

## 2017-02-21 ENCOUNTER — Other Ambulatory Visit: Payer: Self-pay | Admitting: Family Medicine

## 2017-02-22 NOTE — Telephone Encounter (Signed)
Filling for Dr. Charlett Blake  Requesting:Xanax Contract: yes UDS: yes 12/22/16 moderate risk  Last OV:02/12/17 Last Refill: 02/12/17 #[id number]  Please Advise

## 2017-02-22 NOTE — Telephone Encounter (Signed)
rx done   Pc

## 2017-02-22 NOTE — Telephone Encounter (Signed)
Faxed rx   pc

## 2017-02-28 ENCOUNTER — Telehealth: Payer: Self-pay | Admitting: Internal Medicine

## 2017-02-28 DIAGNOSIS — M6282 Rhabdomyolysis: Secondary | ICD-10-CM

## 2017-02-28 HISTORY — DX: Rhabdomyolysis: M62.82

## 2017-02-28 MED ORDER — BUDESONIDE-FORMOTEROL FUMARATE 80-4.5 MCG/ACT IN AERO
2.0000 | INHALATION_SPRAY | Freq: Two times a day (BID) | RESPIRATORY_TRACT | 0 refills | Status: DC
Start: 2017-02-28 — End: 2017-03-06

## 2017-02-28 NOTE — Telephone Encounter (Signed)
Called pharmacy, states that pt is in the donut hole.   Spoke with pt to make aware- left samples of Symbicort up front for pickup.  Nothing further needed at this time.

## 2017-03-01 ENCOUNTER — Telehealth: Payer: Self-pay | Admitting: Family Medicine

## 2017-03-01 NOTE — Telephone Encounter (Signed)
[employer] and did confirm medications PCP prescribed/diagnosis for why prescribed and did get approval for them

## 2017-03-01 NOTE — Telephone Encounter (Signed)
Caller name: Broadus John  Relation to pt: Archivist from [employer] back number: [phone] (tel of any Pharmacist) Pharmacy:   Reason for call: Pharmacist from [place] [facility] is needing clinical information to get rx approved for: cyclobenzaprine (FLEXERIL) 10 MG tablet, montelukast (SINGULAIR) 10 MG tablet, venlafaxine XR (EFFEXOR-XR) 75 MG 24 hr capsule, citalopram (CELEXA) 40 MG tablet. Please advise.

## 2017-03-01 NOTE — Telephone Encounter (Signed)
Received PA approval for patients Amitriptyline from 07/29/2016  Through  07/30/2017

## 2017-03-02 NOTE — Telephone Encounter (Signed)
Received PA  Approval for Singulair, effexor, celexa, mirapex and cyclobenzaprine are approved from 07/29/2017 through  07/30/2017.  Scanned letters into chart.

## 2017-03-06 ENCOUNTER — Ambulatory Visit (INDEPENDENT_AMBULATORY_CARE_PROVIDER_SITE_OTHER)
Admission: RE | Admit: 2017-03-06 | Discharge: 2017-03-06 | Disposition: A | Discharge status: Home / Self Care | Payer: Medicare Other | Source: Ambulatory Visit | Attending: Internal Medicine | Admitting: Internal Medicine

## 2017-03-06 ENCOUNTER — Ambulatory Visit: Payer: BLUE CROSS/BLUE SHIELD | Admitting: Internal Medicine

## 2017-03-06 ENCOUNTER — Ambulatory Visit: Payer: Medicare Other

## 2017-03-06 ENCOUNTER — Encounter: Payer: Self-pay | Admitting: Internal Medicine

## 2017-03-06 ENCOUNTER — Ambulatory Visit (INDEPENDENT_AMBULATORY_CARE_PROVIDER_SITE_OTHER): Payer: Medicare Other | Admitting: Internal Medicine

## 2017-03-06 VITALS — BP 106/68 | HR 87 | Ht 63.0 in | Wt 143.0 lb

## 2017-03-06 DIAGNOSIS — J479 Bronchiectasis, uncomplicated: Secondary | ICD-10-CM

## 2017-03-06 DIAGNOSIS — J45991 Cough variant asthma: Secondary | ICD-10-CM

## 2017-03-06 DIAGNOSIS — R05 Cough: Secondary | ICD-10-CM | POA: Diagnosis not present

## 2017-03-06 MED ORDER — PREDNISONE 10 MG PO TABS: ORAL_TABLET | ORAL | 0 refills | Status: DC

## 2017-03-06 NOTE — Progress Notes (Signed)
Subjective:     Patient ID: Rachael Johnson, female   DOB: 1951/04/11,   MRN: [id number]    Brief patient profile:  67 yowf quit smoking  1992 with onset of breathing problems spring 2017 and gradually worse since then assoc with harsh coughing fits referred to pulmonary clinic 11/27/2016 by Dr   Fayrene Helper with CT scan bronchiecatsis/ MPN's  10/21/16 and nl spirometry 11/27/16    History of Present Illness  11/27/2016 1st [facility] office visit/ Charolett Yarrow   Chief Complaint  Patient presents with  . Pulmonary Consult    Referred by Dr. [name] for eval of abnormal ct chest. She c/o SOB and cough for the past 6-8 months. Her cough is non prod. She states occ she gets SOB walking short distances such as from room to room at home.   only doubled hydrea on 10/17/16 and worse since then with severe day > noct  Dry coughing fits to point of gagging and doe x across the room not reproduced at office today.  Onset originally was indolent/ pattern has been progressively worse esp since last month or so but does not connect the change in hydrea with her symptoms.  On 3 pages of meds with very poor insight on how / when to use them and reports no better with symbicort/singulair/ saba   rec Permanently stop fish oil Continue symbicort but try the 80 strength Take 2 puffs first thing in am and then another 2 puffs about 12 hours later.  Work on inhaler technique:  Protonix (prilosec same thing so take one or the other but not both)  40 mg Take 30- 60 min before your first and last meals of the day and also zantac at bedtime  Take delsym two tsp every 12 hours and supplement if needed with  Tylenol #3  up to 2 every 4 hours Once you have eliminated the cough for 3 straight days try reducing the tylenol #3   first,  then the delsym as tolerated.     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 Follow-up     Pt here to discuss her CT scan results, She feels like her breathing is not getting better, she is having increase sob, she has a non productive, slight wheezing, Denies chest tightness,fever,  She states she has needed her rescue inhaler   no change doe = room to room/ can't lie back due to cough/ breathing difficulty  Sleep on 3-4 pillows x 1.5 y lots of cough off esp immediately on lying down but dry/ better with tessilon when uses it Ventolin helps breathing some though hfa quite poor rec Work on inhaler technique:   No change in medications except go ahead and use the tessalon up three times a day and at bedtime take CHLORPHENIRAMINE  4 mg -  available over the counter- may cause drowsiness so start with just a bedtime dose x  two and see how you tolerate it before trying in daytime      03/06/2017  f/u - extended -ov/[name] re:  Bronchiectasis / ? mai / not using med calendar  Chief Complaint  Patient presents with  . Follow-up    Pt was unable to complete the PFT due to coughing too much and almost vomiting due to coughing so hard. Pt also states that she is SOB almost all the time.  at hs taking chlorpheniramine x 2 and elavil 20 mg at bedtime and tessilon hs and still waking up w/in 3 hours with cough Could not do pfts due ot cough Cough is dry unless vomiting Very confused with meds pred helps but symbicort doesn't  Mostly just sob with coughing but this is also better p prednisone  No obvious day to day or daytime variability or assoc excess/ purulent sputum or mucus plugs or hemoptysis or cp or chest tightness, subjective wheeze or overt sinus or hb symptoms. No unusual exp hx or h/o childhood pna/ asthma or knowledge of premature birth.  Sleeping ok without nocturnal  or early am exacerbation  of respiratory  c/o's or need for noct saba. Also denies any obvious fluctuation of symptoms with weather or environmental changes or other aggravating or alleviating factors except as  outlined above   Current Medications, Allergies, Complete Past Medical History, Past Surgical History, Family History, and Social History were reviewed in [employer] record.  ROS  The following are not active complaints unless bolded sore throat, dysphagia, dental problems, itching, sneezing,  nasal congestion or excess/ purulent secretions, ear ache,   fever, chills, sweats, unintended wt loss, classically pleuritic or exertional cp,  orthopnea pnd or leg swelling, presyncope, palpitations, abdominal pain, anorexia, nausea, vomiting, diarrhea  or change in bowel or bladder habits, change in stools or urine, dysuria,hematuria,  rash, arthralgias, visual complaints, headache, numbness, weakness or ataxia or problems with walking or coordination,  change in mood/affect or memory.               Objective:   Physical Exam    amb wf nad until starts harsh barking coughing fits   03/06/2017          143  01/23/2017        142   11/27/16 140 lb (63.5 kg)  10/26/16 135 lb 3.2 oz (61.3 kg)  10/17/16 129 lb (58.5 kg)    Vital signs reviewed  - Note on arrival 02 sats  99% on RA      HEENT: nl dentition, turbinates bilaterally, and oropharynx. Nl external ear canals without cough reflex   NECK :  without JVD/Nodes/TM/ nl carotid upstrokes bilaterally   LUNGS: no acc muscle use,  Nl contour chest with bilateral insp /exp rhonchi but no cough at all  on insp or exp     CV:  RRR  no s3 or murmur or increase in P2, and no edema   ABD:  soft and nontender with nl inspiratory excursion in the supine position. No bruits or organomegaly appreciated, bowel sounds nl  MS:  Nl gait/ ext warm without deformities, calf tenderness, cyanosis or clubbing No obvious joint restrictions   SKIN: warm and dry without lesions    NEURO:  alert, approp, nl sensorium with  no motor or cerebellar deficits apparent.     I personally reviewed images and agree with radiology impression  as follows:   Chest CT= HRCT  01/11/17 1. 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 2. Larger nodules seen in the right upper lobe on the prior study are far less distinct on today's examination, likely part of the same underlying chronic infectious process.   CXR PA and Lateral:   03/06/2017 :    I personally reviewed images   impression as follows:   No gross changes in  minimally increased markings      Assessment:

## 2017-03-06 NOTE — Patient Instructions (Addendum)
Prednisone 10 mg take  4 each am x 2 days,   2 each am x 2 days,  1 each am x 2 days and stop   Work on inhaler technique:  relax and gently blow all the way out then take a nice smooth deep breath back in, triggering the inhaler at same time you start breathing in.  Hold for up to 5 seconds if you can. Blow out thru nose. Rinse and gargle with water when done   Please remember to go to the  x-ray department downstairs in the basement  for your tests - we will call you with the results when they are available.    See Tammy NP  4 weeks with all your medications, even over the counter meds, separated in two separate bags, the ones you take no matter what vs the ones you stop once you feel better and take only as needed when you feel you need them.   Tammy  will generate for you a new user friendly medication calendar that will put [place] all on the same page re: your medication use and she will set up to see me Late add: flutter/  Allergy profile needed on return

## 2017-03-07 NOTE — Progress Notes (Signed)
LMTCB

## 2017-03-07 NOTE — Assessment & Plan Note (Addendum)
-   Spirometry 11/27/2016  wnl p am symbicort no curvature  Sinus CT 12/14/2016 > neg  03/06/2017  After extensive coaching HFA effectiveness =    75% from a baseline of 25%   Lack of cough resolution on a verified empirical regimen (which we have yet to accomplish)  could mean an alternative diagnosis, persistence of the disease state (eg sinusitis/ ruled out or bronchiectasis/ruled in but not typical for this cough - see sep a/p) , or inadequacy of currently available therapy (eg no medical rx available for non-acid gerd -which may be the case as coughs to ;point of vomiting)  Most of her symptoms at this point are typical of a cyclical cough pattern but response to prednisone suggestive of asthma or eos bronchitis/ rhinitis. Will repeat one more course of pred and work harder on optimal hfa and consider higher dose ICS when returns   Of the three most common causes of  Sub-acute or recurrent or chronic cough, only one (GERD)  can actually contribute to/ trigger  the other two (asthma and post nasal drip syndrome)  and perpetuate the cylce of cough.  While not intuitively obvious, many patients with chronic low grade reflux do not cough until there is a primary insult that disturbs the protective epithelial barrier and exposes sensitive nerve endings.   This is typically viral but can be direct physical injury such as with an endotracheal tube.   The point is that once this occurs, it is difficult to eliminate the cycle  using anything but a maximally effective acid suppression regimen at least in the short run, accompanied by an appropriate diet to address non acid GERD and control   I had an extended discussion with the patient reviewing all relevant studies completed to date and  lasting    56minutes of a 40 minute office  visit  Addressing refractory symptoms with multiple possible causes  Each maintenance medication was reviewed in detail including most importantly the difference between maintenance  and prns and under what circumstances the prns are to be triggered using an action plan format that is not reflected in the computer generated alphabetically organized AVS but trather by a customized med calendar that reflects the AVS meds with confirmed 100% correlation.   In addition, Please see AVS for unique instructions that I personally wrote and verbalized to the the pt in detail and then reviewed with pt  by my nurse highlighting any  changes in therapy recommended at today's visit to their plan of care.    Needs to return with all meds in hand using a trust but verify approach to confirm accurate Medication  Reconciliation The principal here is that until we are certain that the  patients are doing what we've asked, it makes no sense to ask them to do more.

## 2017-03-07 NOTE — Telephone Encounter (Signed)
Notes recorded by Tanda Rockers, MD on 03/07/2017 at 11:44 AM EDT Call pt: Reviewed cxr and no acute change so no change in recommendations made at [facility] ----- Spoke with pt, aware of results/recs.  Nothing further needed.

## 2017-03-07 NOTE — Assessment & Plan Note (Signed)
-   Spirometry 11/27/2016  wnl p am symbicort no curvature  Sinus CT 12/14/2016 > neg 03/06/2017  After extensive coaching HFA effectiveness =    75% from a baseline of 25%   Need to add flutter valve and consider fob but note risk of setting off cough so hold off for now as this cough is not typical a all of bronchiectasis   Also needs IgE level to r/o abpa / allergic component to her problems on return

## 2017-03-11 ENCOUNTER — Other Ambulatory Visit: Payer: Self-pay | Admitting: Family Medicine

## 2017-03-12 ENCOUNTER — Emergency Department (HOSPITAL_COMMUNITY): Payer: Medicare Other

## 2017-03-12 ENCOUNTER — Encounter (HOSPITAL_COMMUNITY): Payer: Self-pay | Admitting: Emergency Medicine

## 2017-03-12 ENCOUNTER — Inpatient Hospital Stay (HOSPITAL_COMMUNITY)
Admission: EM | Admit: 2017-03-12 | Discharge: 2017-03-23 | DRG: 682 | Disposition: A | Discharge status: Home Health | Payer: Medicare Other | Attending: Family Medicine | Admitting: Family Medicine

## 2017-03-12 DIAGNOSIS — T796XXA Traumatic ischemia of muscle, initial encounter: Secondary | ICD-10-CM

## 2017-03-12 DIAGNOSIS — D473 Essential (hemorrhagic) thrombocythemia: Secondary | ICD-10-CM | POA: Diagnosis present

## 2017-03-12 DIAGNOSIS — L89819 Pressure ulcer of head, unspecified stage: Secondary | ICD-10-CM | POA: Diagnosis present

## 2017-03-12 DIAGNOSIS — S199XXA Unspecified injury of neck, initial encounter: Secondary | ICD-10-CM | POA: Diagnosis not present

## 2017-03-12 DIAGNOSIS — N200 Calculus of kidney: Secondary | ICD-10-CM | POA: Diagnosis not present

## 2017-03-12 DIAGNOSIS — D539 Nutritional anemia, unspecified: Secondary | ICD-10-CM | POA: Diagnosis present

## 2017-03-12 DIAGNOSIS — R0602 Shortness of breath: Secondary | ICD-10-CM

## 2017-03-12 DIAGNOSIS — D75839 Thrombocytosis, unspecified: Secondary | ICD-10-CM | POA: Diagnosis present

## 2017-03-12 DIAGNOSIS — W1809XA Striking against other object with subsequent fall, initial encounter: Secondary | ICD-10-CM | POA: Diagnosis present

## 2017-03-12 DIAGNOSIS — M6282 Rhabdomyolysis: Secondary | ICD-10-CM | POA: Diagnosis present

## 2017-03-12 DIAGNOSIS — J449 Chronic obstructive pulmonary disease, unspecified: Secondary | ICD-10-CM | POA: Diagnosis present

## 2017-03-12 DIAGNOSIS — K7200 Acute and subacute hepatic failure without coma: Secondary | ICD-10-CM | POA: Diagnosis not present

## 2017-03-12 DIAGNOSIS — Z79899 Other long term (current) drug therapy: Secondary | ICD-10-CM

## 2017-03-12 DIAGNOSIS — M25511 Pain in right shoulder: Secondary | ICD-10-CM

## 2017-03-12 DIAGNOSIS — K219 Gastro-esophageal reflux disease without esophagitis: Secondary | ICD-10-CM | POA: Diagnosis present

## 2017-03-12 DIAGNOSIS — R55 Syncope and collapse: Secondary | ICD-10-CM | POA: Diagnosis not present

## 2017-03-12 DIAGNOSIS — E860 Dehydration: Secondary | ICD-10-CM | POA: Diagnosis present

## 2017-03-12 DIAGNOSIS — I272 Pulmonary hypertension, unspecified: Secondary | ICD-10-CM | POA: Diagnosis present

## 2017-03-12 DIAGNOSIS — G9340 Encephalopathy, unspecified: Secondary | ICD-10-CM | POA: Diagnosis present

## 2017-03-12 DIAGNOSIS — F418 Other specified anxiety disorders: Secondary | ICD-10-CM | POA: Diagnosis present

## 2017-03-12 DIAGNOSIS — M545 Low back pain: Secondary | ICD-10-CM | POA: Diagnosis not present

## 2017-03-12 DIAGNOSIS — M7989 Other specified soft tissue disorders: Secondary | ICD-10-CM | POA: Diagnosis not present

## 2017-03-12 DIAGNOSIS — E039 Hypothyroidism, unspecified: Secondary | ICD-10-CM | POA: Diagnosis present

## 2017-03-12 DIAGNOSIS — M546 Pain in thoracic spine: Secondary | ICD-10-CM | POA: Diagnosis not present

## 2017-03-12 DIAGNOSIS — Z7982 Long term (current) use of aspirin: Secondary | ICD-10-CM

## 2017-03-12 DIAGNOSIS — N179 Acute kidney failure, unspecified: Secondary | ICD-10-CM | POA: Diagnosis not present

## 2017-03-12 DIAGNOSIS — S41001A Unspecified open wound of right shoulder, initial encounter: Secondary | ICD-10-CM | POA: Diagnosis present

## 2017-03-12 DIAGNOSIS — F4489 Other dissociative and conversion disorders: Secondary | ICD-10-CM | POA: Diagnosis not present

## 2017-03-12 DIAGNOSIS — S0100XA Unspecified open wound of scalp, initial encounter: Secondary | ICD-10-CM

## 2017-03-12 DIAGNOSIS — S20219A Contusion of unspecified front wall of thorax, initial encounter: Secondary | ICD-10-CM | POA: Diagnosis not present

## 2017-03-12 DIAGNOSIS — F419 Anxiety disorder, unspecified: Secondary | ICD-10-CM | POA: Diagnosis present

## 2017-03-12 DIAGNOSIS — E870 Hyperosmolality and hypernatremia: Secondary | ICD-10-CM | POA: Diagnosis present

## 2017-03-12 DIAGNOSIS — L89109 Pressure ulcer of unspecified part of back, unspecified stage: Secondary | ICD-10-CM | POA: Diagnosis present

## 2017-03-12 DIAGNOSIS — K5900 Constipation, unspecified: Secondary | ICD-10-CM | POA: Diagnosis not present

## 2017-03-12 DIAGNOSIS — K5730 Diverticulosis of large intestine without perforation or abscess without bleeding: Secondary | ICD-10-CM | POA: Diagnosis present

## 2017-03-12 DIAGNOSIS — F329 Major depressive disorder, single episode, unspecified: Secondary | ICD-10-CM | POA: Diagnosis present

## 2017-03-12 DIAGNOSIS — R4020 Unspecified coma: Secondary | ICD-10-CM | POA: Diagnosis not present

## 2017-03-12 DIAGNOSIS — R945 Abnormal results of liver function studies: Secondary | ICD-10-CM | POA: Diagnosis present

## 2017-03-12 DIAGNOSIS — N390 Urinary tract infection, site not specified: Secondary | ICD-10-CM

## 2017-03-12 DIAGNOSIS — G2581 Restless legs syndrome: Secondary | ICD-10-CM | POA: Diagnosis present

## 2017-03-12 DIAGNOSIS — E785 Hyperlipidemia, unspecified: Secondary | ICD-10-CM | POA: Diagnosis present

## 2017-03-12 DIAGNOSIS — Z87891 Personal history of nicotine dependence: Secondary | ICD-10-CM

## 2017-03-12 DIAGNOSIS — R7989 Other specified abnormal findings of blood chemistry: Secondary | ICD-10-CM | POA: Diagnosis present

## 2017-03-12 DIAGNOSIS — R52 Pain, unspecified: Secondary | ICD-10-CM

## 2017-03-12 DIAGNOSIS — I4581 Long QT syndrome: Secondary | ICD-10-CM | POA: Diagnosis present

## 2017-03-12 DIAGNOSIS — R748 Abnormal levels of other serum enzymes: Secondary | ICD-10-CM

## 2017-03-12 DIAGNOSIS — R778 Other specified abnormalities of plasma proteins: Secondary | ICD-10-CM

## 2017-03-12 DIAGNOSIS — E872 Acidosis: Secondary | ICD-10-CM | POA: Diagnosis not present

## 2017-03-12 DIAGNOSIS — R740 Nonspecific elevation of levels of transaminase and lactic acid dehydrogenase [LDH]: Secondary | ICD-10-CM | POA: Diagnosis present

## 2017-03-12 DIAGNOSIS — S21202A Unspecified open wound of left back wall of thorax without penetration into thoracic cavity, initial encounter: Secondary | ICD-10-CM

## 2017-03-12 DIAGNOSIS — R402421 Glasgow coma scale score 9-12, in the field [EMT or ambulance]: Secondary | ICD-10-CM | POA: Diagnosis not present

## 2017-03-12 DIAGNOSIS — R061 Stridor: Secondary | ICD-10-CM | POA: Diagnosis not present

## 2017-03-12 DIAGNOSIS — R9431 Abnormal electrocardiogram [ECG] [EKG]: Secondary | ICD-10-CM | POA: Diagnosis not present

## 2017-03-12 DIAGNOSIS — K1379 Other lesions of oral mucosa: Secondary | ICD-10-CM

## 2017-03-12 DIAGNOSIS — Z791 Long term (current) use of non-steroidal anti-inflammatories (NSAID): Secondary | ICD-10-CM

## 2017-03-12 DIAGNOSIS — I10 Essential (primary) hypertension: Secondary | ICD-10-CM | POA: Diagnosis present

## 2017-03-12 HISTORY — DX: Rhabdomyolysis: M62.82

## 2017-03-12 LAB — CBC
HCT: 37.8 % (ref 36.0–46.0)
Hemoglobin: 12.9 g/dL (ref 12.0–15.0)
MCH: 41.9 pg — ABNORMAL HIGH (ref 26.0–34.0)
MCHC: 34.1 g/dL (ref 30.0–36.0)
MCV: 122.7 fL — ABNORMAL HIGH (ref 78.0–100.0)
Platelets: 588 10*3/uL — ABNORMAL HIGH (ref 150–400)
RBC: 3.08 MIL/uL — ABNORMAL LOW (ref 3.87–5.11)
RDW: 14.2 % (ref 11.5–15.5)
WBC: 11.6 10*3/uL — ABNORMAL HIGH (ref 4.0–10.5)

## 2017-03-12 LAB — I-STAT CHEM 8, ED
BUN: 69 mg/dL — ABNORMAL HIGH (ref 6–20)
Calcium, Ion: 0.96 mmol/L — ABNORMAL LOW (ref 1.15–1.40)
Chloride: 116 mmol/L — ABNORMAL HIGH (ref 101–111)
Creatinine, Ser: 2.7 mg/dL — ABNORMAL HIGH (ref 0.44–1.00)
Glucose, Bld: 110 mg/dL — ABNORMAL HIGH (ref 65–99)
HCT: 40 % (ref 36.0–46.0)
Hemoglobin: 13.6 g/dL (ref 12.0–15.0)
Potassium: 3.8 mmol/L (ref 3.5–5.1)
Sodium: 147 mmol/L — ABNORMAL HIGH (ref 135–145)
TCO2: 19 mmol/L (ref 0–100)

## 2017-03-12 LAB — CBG MONITORING, ED: Glucose-Capillary: 112 mg/dL — ABNORMAL HIGH (ref 65–99)

## 2017-03-12 LAB — I-STAT CG4 LACTIC ACID, ED: Lactic Acid, Venous: 2.07 mmol/L (ref 0.5–1.9)

## 2017-03-12 MED ORDER — LACTATED RINGERS IV BOLUS (SEPSIS)
1000.0000 mL | Freq: Once | INTRAVENOUS | Status: AC
  Administered 2017-03-12: 1000 mL via INTRAVENOUS

## 2017-03-12 NOTE — ED Notes (Signed)
c-collar placed by Dr. Kathrynn Humble.

## 2017-03-12 NOTE — ED Notes (Signed)
CBG 112.  

## 2017-03-12 NOTE — ED Notes (Signed)
Patient transported to X-ray 

## 2017-03-12 NOTE — ED Triage Notes (Signed)
Per EMS, pt was found at home laying on the floor for an unknown amount of time. Family spoke to the pt at 1700 on Sunday (8/12) and she seemed at her baseline. Pt has abrasions and redness to both shoulders. Alert to self and place only. Pt repeating "I'm so thirsty", mucus membranes dry. EMS vital signs stable.

## 2017-03-12 NOTE — Telephone Encounter (Signed)
Last office visit on 02/12/2017 #90 with 1 refill 11/2016

## 2017-03-13 ENCOUNTER — Encounter (HOSPITAL_COMMUNITY): Payer: Self-pay | Admitting: Cardiology

## 2017-03-13 ENCOUNTER — Emergency Department (HOSPITAL_COMMUNITY): Payer: Medicare Other

## 2017-03-13 ENCOUNTER — Inpatient Hospital Stay (HOSPITAL_COMMUNITY): Payer: Medicare Other

## 2017-03-13 DIAGNOSIS — I10 Essential (primary) hypertension: Secondary | ICD-10-CM | POA: Diagnosis not present

## 2017-03-13 DIAGNOSIS — E870 Hyperosmolality and hypernatremia: Secondary | ICD-10-CM

## 2017-03-13 DIAGNOSIS — F419 Anxiety disorder, unspecified: Secondary | ICD-10-CM | POA: Diagnosis present

## 2017-03-13 DIAGNOSIS — R55 Syncope and collapse: Secondary | ICD-10-CM | POA: Diagnosis not present

## 2017-03-13 DIAGNOSIS — K1379 Other lesions of oral mucosa: Secondary | ICD-10-CM | POA: Diagnosis present

## 2017-03-13 DIAGNOSIS — R7989 Other specified abnormal findings of blood chemistry: Secondary | ICD-10-CM | POA: Diagnosis not present

## 2017-03-13 DIAGNOSIS — J449 Chronic obstructive pulmonary disease, unspecified: Secondary | ICD-10-CM | POA: Diagnosis not present

## 2017-03-13 DIAGNOSIS — M546 Pain in thoracic spine: Secondary | ICD-10-CM | POA: Diagnosis not present

## 2017-03-13 DIAGNOSIS — R0602 Shortness of breath: Secondary | ICD-10-CM | POA: Diagnosis not present

## 2017-03-13 DIAGNOSIS — R945 Abnormal results of liver function studies: Secondary | ICD-10-CM | POA: Diagnosis not present

## 2017-03-13 DIAGNOSIS — E872 Acidosis: Secondary | ICD-10-CM | POA: Diagnosis not present

## 2017-03-13 DIAGNOSIS — S0100XA Unspecified open wound of scalp, initial encounter: Secondary | ICD-10-CM | POA: Diagnosis not present

## 2017-03-13 DIAGNOSIS — S199XXA Unspecified injury of neck, initial encounter: Secondary | ICD-10-CM | POA: Diagnosis not present

## 2017-03-13 DIAGNOSIS — E785 Hyperlipidemia, unspecified: Secondary | ICD-10-CM | POA: Diagnosis not present

## 2017-03-13 DIAGNOSIS — N179 Acute kidney failure, unspecified: Principal | ICD-10-CM | POA: Diagnosis present

## 2017-03-13 DIAGNOSIS — K5900 Constipation, unspecified: Secondary | ICD-10-CM | POA: Diagnosis not present

## 2017-03-13 DIAGNOSIS — R748 Abnormal levels of other serum enzymes: Secondary | ICD-10-CM

## 2017-03-13 DIAGNOSIS — R4020 Unspecified coma: Secondary | ICD-10-CM | POA: Diagnosis not present

## 2017-03-13 DIAGNOSIS — M545 Low back pain: Secondary | ICD-10-CM | POA: Diagnosis not present

## 2017-03-13 DIAGNOSIS — E860 Dehydration: Secondary | ICD-10-CM | POA: Diagnosis present

## 2017-03-13 DIAGNOSIS — T796XXD Traumatic ischemia of muscle, subsequent encounter: Secondary | ICD-10-CM | POA: Diagnosis not present

## 2017-03-13 DIAGNOSIS — S20219A Contusion of unspecified front wall of thorax, initial encounter: Secondary | ICD-10-CM | POA: Diagnosis not present

## 2017-03-13 DIAGNOSIS — D473 Essential (hemorrhagic) thrombocythemia: Secondary | ICD-10-CM | POA: Diagnosis not present

## 2017-03-13 DIAGNOSIS — E039 Hypothyroidism, unspecified: Secondary | ICD-10-CM | POA: Diagnosis not present

## 2017-03-13 DIAGNOSIS — K219 Gastro-esophageal reflux disease without esophagitis: Secondary | ICD-10-CM | POA: Diagnosis not present

## 2017-03-13 DIAGNOSIS — L89109 Pressure ulcer of unspecified part of back, unspecified stage: Secondary | ICD-10-CM | POA: Diagnosis present

## 2017-03-13 DIAGNOSIS — N390 Urinary tract infection, site not specified: Secondary | ICD-10-CM | POA: Diagnosis not present

## 2017-03-13 DIAGNOSIS — N200 Calculus of kidney: Secondary | ICD-10-CM | POA: Diagnosis not present

## 2017-03-13 DIAGNOSIS — L89819 Pressure ulcer of head, unspecified stage: Secondary | ICD-10-CM | POA: Diagnosis present

## 2017-03-13 DIAGNOSIS — T796XXA Traumatic ischemia of muscle, initial encounter: Secondary | ICD-10-CM | POA: Diagnosis not present

## 2017-03-13 DIAGNOSIS — K7200 Acute and subacute hepatic failure without coma: Secondary | ICD-10-CM | POA: Diagnosis not present

## 2017-03-13 DIAGNOSIS — G2581 Restless legs syndrome: Secondary | ICD-10-CM | POA: Diagnosis present

## 2017-03-13 DIAGNOSIS — D539 Nutritional anemia, unspecified: Secondary | ICD-10-CM | POA: Diagnosis present

## 2017-03-13 DIAGNOSIS — W1809XA Striking against other object with subsequent fall, initial encounter: Secondary | ICD-10-CM | POA: Diagnosis present

## 2017-03-13 DIAGNOSIS — M6282 Rhabdomyolysis: Secondary | ICD-10-CM

## 2017-03-13 DIAGNOSIS — F418 Other specified anxiety disorders: Secondary | ICD-10-CM

## 2017-03-13 DIAGNOSIS — G9340 Encephalopathy, unspecified: Secondary | ICD-10-CM | POA: Diagnosis not present

## 2017-03-13 DIAGNOSIS — R778 Other specified abnormalities of plasma proteins: Secondary | ICD-10-CM | POA: Diagnosis present

## 2017-03-13 DIAGNOSIS — S21202A Unspecified open wound of left back wall of thorax without penetration into thoracic cavity, initial encounter: Secondary | ICD-10-CM | POA: Diagnosis not present

## 2017-03-13 DIAGNOSIS — R52 Pain, unspecified: Secondary | ICD-10-CM | POA: Diagnosis not present

## 2017-03-13 DIAGNOSIS — S41001A Unspecified open wound of right shoulder, initial encounter: Secondary | ICD-10-CM | POA: Diagnosis present

## 2017-03-13 LAB — URINALYSIS, ROUTINE W REFLEX MICROSCOPIC
Bacteria, UA: NONE SEEN
Bilirubin Urine: NEGATIVE
Glucose, UA: 50 mg/dL — AB
Ketones, ur: NEGATIVE mg/dL
Leukocytes, UA: NEGATIVE
Nitrite: NEGATIVE
Protein, ur: 100 mg/dL — AB
Specific Gravity, Urine: 1.019 (ref 1.005–1.030)
pH: 5 (ref 5.0–8.0)

## 2017-03-13 LAB — BASIC METABOLIC PANEL
Anion gap: 11 (ref 5–15)
BUN: 70 mg/dL — ABNORMAL HIGH (ref 6–20)
CO2: 21 mmol/L — ABNORMAL LOW (ref 22–32)
Calcium: 7.9 mg/dL — ABNORMAL LOW (ref 8.9–10.3)
Chloride: 109 mmol/L (ref 101–111)
Creatinine, Ser: 2.59 mg/dL — ABNORMAL HIGH (ref 0.44–1.00)
GFR calc Af Amer: 21 mL/min — ABNORMAL LOW (ref >60)
GFR calc non Af Amer: 18 mL/min — ABNORMAL LOW (ref >60)
Glucose, Bld: 153 mg/dL — ABNORMAL HIGH (ref 65–99)
Potassium: 3.5 mmol/L (ref 3.5–5.1)
Sodium: 141 mmol/L (ref 135–145)

## 2017-03-13 LAB — COMPREHENSIVE METABOLIC PANEL
ALT: 201 U/L — ABNORMAL HIGH (ref 14–54)
AST: 876 U/L — ABNORMAL HIGH (ref 15–41)
Albumin: 4 g/dL (ref 3.5–5.0)
Alkaline Phosphatase: 62 U/L (ref 38–126)
Anion gap: 18 — ABNORMAL HIGH (ref 5–15)
BUN: 73 mg/dL — ABNORMAL HIGH (ref 6–20)
CO2: 16 mmol/L — ABNORMAL LOW (ref 22–32)
Calcium: 8.7 mg/dL — ABNORMAL LOW (ref 8.9–10.3)
Chloride: 112 mmol/L — ABNORMAL HIGH (ref 101–111)
Creatinine, Ser: 2.97 mg/dL — ABNORMAL HIGH (ref 0.44–1.00)
GFR calc Af Amer: 18 mL/min — ABNORMAL LOW (ref >60)
GFR calc non Af Amer: 16 mL/min — ABNORMAL LOW (ref >60)
Glucose, Bld: 117 mg/dL — ABNORMAL HIGH (ref 65–99)
Potassium: 3.7 mmol/L (ref 3.5–5.1)
Sodium: 146 mmol/L — ABNORMAL HIGH (ref 135–145)
Total Bilirubin: 1.4 mg/dL — ABNORMAL HIGH (ref 0.3–1.2)
Total Protein: 7.5 g/dL (ref 6.5–8.1)

## 2017-03-13 LAB — CBC
HCT: 33.3 % — ABNORMAL LOW (ref 36.0–46.0)
Hemoglobin: 11 g/dL — ABNORMAL LOW (ref 12.0–15.0)
MCH: 40.9 pg — ABNORMAL HIGH (ref 26.0–34.0)
MCHC: 33 g/dL (ref 30.0–36.0)
MCV: 123.8 fL — ABNORMAL HIGH (ref 78.0–100.0)
Platelets: 554 10*3/uL — ABNORMAL HIGH (ref 150–400)
RBC: 2.69 MIL/uL — ABNORMAL LOW (ref 3.87–5.11)
RDW: 14.6 % (ref 11.5–15.5)
WBC: 10.4 10*3/uL (ref 4.0–10.5)

## 2017-03-13 LAB — TROPONIN I
Troponin I: 21.5 ng/mL (ref <0.03)
Troponin I: 16.65 ng/mL (ref <0.03)
Troponin I: 16.09 ng/mL (ref <0.03)
Troponin I: 14.51 ng/mL (ref <0.03)
Troponin I: 10.38 ng/mL (ref <0.03)

## 2017-03-13 LAB — RAPID URINE DRUG SCREEN, HOSP PERFORMED
Amphetamines: NOT DETECTED
Barbiturates: NOT DETECTED
Benzodiazepines: POSITIVE — AB
Cocaine: NOT DETECTED
Opiates: POSITIVE — AB
Tetrahydrocannabinol: NOT DETECTED

## 2017-03-13 LAB — TSH: TSH: 2.683 u[IU]/mL (ref 0.350–4.500)

## 2017-03-13 LAB — I-STAT VENOUS BLOOD GAS, ED
Acid-base deficit: 5 mmol/L — ABNORMAL HIGH (ref 0.0–2.0)
Bicarbonate: 20.2 mmol/L (ref 20.0–28.0)
O2 Saturation: 70 %
TCO2: 21 mmol/L (ref 0–100)
pCO2, Ven: 37.1 mmHg — ABNORMAL LOW (ref 44.0–60.0)
pH, Ven: 7.344 (ref 7.250–7.430)
pO2, Ven: 39 mmHg (ref 32.0–45.0)

## 2017-03-13 LAB — PROTIME-INR
INR: 1.23
Prothrombin Time: 15.6 seconds — ABNORMAL HIGH (ref 11.4–15.2)

## 2017-03-13 LAB — ETHANOL: Alcohol, Ethyl (B): <5 mg/dL (ref <5)

## 2017-03-13 LAB — AMMONIA: Ammonia: 35 umol/L (ref 9–35)

## 2017-03-13 LAB — CK: Total CK: 32176 U/L — ABNORMAL HIGH (ref 38–234)

## 2017-03-13 LAB — HEPARIN LEVEL (UNFRACTIONATED)
Heparin Unfractionated: 0.17 IU/mL — ABNORMAL LOW (ref 0.30–0.70)
Heparin Unfractionated: 0.35 IU/mL (ref 0.30–0.70)

## 2017-03-13 LAB — APTT: aPTT: 29 seconds (ref 24–36)

## 2017-03-13 LAB — VITAMIN B12: Vitamin B-12: 230 pg/mL (ref 180–914)

## 2017-03-13 MED ORDER — HEPARIN (PORCINE) IN NACL 100-0.45 UNIT/ML-% IJ SOLN
1200.0000 [IU]/h | INTRAMUSCULAR | Status: DC
  Administered 2017-03-13: 800 [IU]/h via INTRAVENOUS
  Administered 2017-03-14 – 2017-03-15 (×2): 1000 [IU]/h via INTRAVENOUS
  Administered 2017-03-16: 1100 [IU]/h via INTRAVENOUS
  Filled 2017-03-13 (×4): qty 250

## 2017-03-13 MED ORDER — FAMOTIDINE 20 MG PO TABS
20.0000 mg | ORAL_TABLET | Freq: Every day | ORAL | Status: DC
  Administered 2017-03-14 – 2017-03-22 (×9): 20 mg via ORAL
  Filled 2017-03-13 (×9): qty 1

## 2017-03-13 MED ORDER — ONDANSETRON HCL 4 MG/2ML IJ SOLN
4.0000 mg | Freq: Four times a day (QID) | INTRAMUSCULAR | Status: DC | PRN
  Administered 2017-03-21: 4 mg via INTRAVENOUS
  Filled 2017-03-13: qty 2

## 2017-03-13 MED ORDER — HYDROCODONE-ACETAMINOPHEN 5-325 MG PO TABS
1.0000 | ORAL_TABLET | Freq: Four times a day (QID) | ORAL | Status: DC | PRN
  Administered 2017-03-13 – 2017-03-17 (×11): 2 via ORAL
  Administered 2017-03-17: 1 via ORAL
  Administered 2017-03-18 – 2017-03-21 (×10): 2 via ORAL
  Administered 2017-03-22 (×2): 1 via ORAL
  Administered 2017-03-23 (×3): 2 via ORAL
  Filled 2017-03-13 (×14): qty 2
  Filled 2017-03-13: qty 1
  Filled 2017-03-13 (×4): qty 2
  Filled 2017-03-13: qty 1
  Filled 2017-03-13 (×2): qty 2
  Filled 2017-03-13: qty 1
  Filled 2017-03-13: qty 2
  Filled 2017-03-13: qty 1
  Filled 2017-03-13 (×2): qty 2

## 2017-03-13 MED ORDER — MOMETASONE FURO-FORMOTEROL FUM 100-5 MCG/ACT IN AERO
2.0000 | INHALATION_SPRAY | Freq: Two times a day (BID) | RESPIRATORY_TRACT | Status: DC
  Administered 2017-03-14 – 2017-03-23 (×13): 2 via RESPIRATORY_TRACT
  Filled 2017-03-13 (×3): qty 8.8

## 2017-03-13 MED ORDER — ALPRAZOLAM 0.5 MG PO TABS
1.0000 mg | ORAL_TABLET | Freq: Three times a day (TID) | ORAL | Status: DC | PRN
  Administered 2017-03-13 – 2017-03-22 (×15): 1 mg via ORAL
  Filled 2017-03-13 (×15): qty 2

## 2017-03-13 MED ORDER — LACTATED RINGERS IV BOLUS (SEPSIS)
2000.0000 mL | Freq: Once | INTRAVENOUS | Status: AC
  Administered 2017-03-13: 2000 mL via INTRAVENOUS

## 2017-03-13 MED ORDER — BLISTEX MEDICATED EX OINT
TOPICAL_OINTMENT | CUTANEOUS | Status: DC | PRN
  Administered 2017-03-20: 07:00:00 via TOPICAL
  Filled 2017-03-13 (×2): qty 6.3

## 2017-03-13 MED ORDER — LORATADINE 10 MG PO TABS
10.0000 mg | ORAL_TABLET | Freq: Every day | ORAL | Status: DC
  Administered 2017-03-13 – 2017-03-23 (×11): 10 mg via ORAL
  Filled 2017-03-13 (×12): qty 1

## 2017-03-13 MED ORDER — CYCLOSPORINE 0.05 % OP EMUL
1.0000 [drp] | Freq: Two times a day (BID) | OPHTHALMIC | Status: DC
  Administered 2017-03-13 – 2017-03-23 (×20): 1 [drp] via OPHTHALMIC
  Filled 2017-03-13 (×23): qty 1

## 2017-03-13 MED ORDER — PANTOPRAZOLE SODIUM 40 MG PO TBEC
40.0000 mg | DELAYED_RELEASE_TABLET | Freq: Two times a day (BID) | ORAL | Status: DC
  Administered 2017-03-13 – 2017-03-23 (×21): 40 mg via ORAL
  Filled 2017-03-13 (×21): qty 1

## 2017-03-13 MED ORDER — SODIUM CHLORIDE 0.9 % IV SOLN
INTRAVENOUS | Status: AC
  Administered 2017-03-13 – 2017-03-14 (×4): via INTRAVENOUS

## 2017-03-13 MED ORDER — CYCLOBENZAPRINE HCL 10 MG PO TABS
10.0000 mg | ORAL_TABLET | Freq: Two times a day (BID) | ORAL | Status: DC | PRN
  Administered 2017-03-14: 10 mg via ORAL
  Filled 2017-03-13: qty 1

## 2017-03-13 MED ORDER — FLUOROMETHOLONE 0.1 % OP SUSP: 1.0000 [drp] | Freq: Every day | OPHTHALMIC | Status: DC | PRN

## 2017-03-13 MED ORDER — CITALOPRAM HYDROBROMIDE 20 MG PO TABS
40.0000 mg | ORAL_TABLET | Freq: Every morning | ORAL | Status: DC
  Administered 2017-03-13 – 2017-03-14 (×2): 40 mg via ORAL
  Filled 2017-03-13: qty 4
  Filled 2017-03-13: qty 2

## 2017-03-13 MED ORDER — HEPARIN BOLUS VIA INFUSION
3000.0000 [IU] | Freq: Once | INTRAVENOUS | Status: AC
  Administered 2017-03-13: 3000 [IU] via INTRAVENOUS
  Filled 2017-03-13: qty 3000

## 2017-03-13 MED ORDER — PRAMIPEXOLE DIHYDROCHLORIDE 0.25 MG PO TABS
0.2500 mg | ORAL_TABLET | Freq: Every day | ORAL | Status: DC
  Administered 2017-03-14 – 2017-03-16 (×3): 0.5 mg via ORAL
  Administered 2017-03-17 – 2017-03-21 (×5): 0.25 mg via ORAL
  Administered 2017-03-22: 0.5 mg via ORAL
  Filled 2017-03-13 (×2): qty 2
  Filled 2017-03-13: qty 1
  Filled 2017-03-13: qty 2
  Filled 2017-03-13: qty 1
  Filled 2017-03-13 (×2): qty 2
  Filled 2017-03-13: qty 1
  Filled 2017-03-13: qty 2
  Filled 2017-03-13: qty 1

## 2017-03-13 MED ORDER — ADULT MULTIVITAMIN LIQUID CH
Freq: Every morning | ORAL | Status: DC
  Administered 2017-03-13 – 2017-03-15 (×3): 15 mL via ORAL
  Filled 2017-03-13 (×10): qty 15

## 2017-03-13 MED ORDER — MECLIZINE HCL 25 MG PO TABS: 12.5000 mg | ORAL_TABLET | Freq: Three times a day (TID) | ORAL | Status: DC | PRN

## 2017-03-13 MED ORDER — MONTELUKAST SODIUM 10 MG PO TABS
10.0000 mg | ORAL_TABLET | Freq: Every day | ORAL | Status: DC
  Administered 2017-03-13 – 2017-03-22 (×10): 10 mg via ORAL
  Filled 2017-03-13 (×10): qty 1

## 2017-03-13 MED ORDER — BUSPIRONE HCL 10 MG PO TABS
10.0000 mg | ORAL_TABLET | Freq: Three times a day (TID) | ORAL | Status: DC
  Administered 2017-03-13 – 2017-03-23 (×31): 10 mg via ORAL
  Filled 2017-03-13 (×34): qty 1

## 2017-03-13 MED ORDER — AMITRIPTYLINE HCL 10 MG PO TABS
20.0000 mg | ORAL_TABLET | Freq: Every day | ORAL | Status: DC
  Administered 2017-03-13 – 2017-03-22 (×8): 20 mg via ORAL
  Filled 2017-03-13 (×12): qty 2

## 2017-03-13 MED ORDER — ASPIRIN EC 81 MG PO TBEC
81.0000 mg | DELAYED_RELEASE_TABLET | Freq: Every morning | ORAL | Status: DC
Start: 2017-03-13 — End: 2017-03-13

## 2017-03-13 MED ORDER — FLUTICASONE PROPIONATE 50 MCG/ACT NA SUSP
2.0000 | Freq: Every day | NASAL | Status: DC
  Administered 2017-03-15 – 2017-03-22 (×8): 2 via NASAL
  Filled 2017-03-13 (×2): qty 16

## 2017-03-13 MED ORDER — NITROGLYCERIN 0.4 MG SL SUBL: 0.4000 mg | SUBLINGUAL_TABLET | SUBLINGUAL | Status: DC | PRN

## 2017-03-13 MED ORDER — ACETAMINOPHEN 325 MG PO TABS
650.0000 mg | ORAL_TABLET | ORAL | Status: DC | PRN
  Administered 2017-03-18 – 2017-03-22 (×4): 650 mg via ORAL
  Filled 2017-03-13 (×4): qty 2

## 2017-03-13 MED ORDER — LACTULOSE 10 GM/15ML PO SOLN
20.0000 g | Freq: Three times a day (TID) | ORAL | Status: DC | PRN
  Administered 2017-03-20 – 2017-03-22 (×3): 20 g via ORAL
  Filled 2017-03-13 (×4): qty 30

## 2017-03-13 MED ORDER — ASPIRIN EC 81 MG PO TBEC
81.0000 mg | DELAYED_RELEASE_TABLET | Freq: Every day | ORAL | Status: DC
  Administered 2017-03-14 – 2017-03-23 (×10): 81 mg via ORAL
  Filled 2017-03-13 (×10): qty 1

## 2017-03-13 MED ORDER — VENLAFAXINE HCL ER 75 MG PO CP24
75.0000 mg | ORAL_CAPSULE | Freq: Every day | ORAL | Status: DC
  Administered 2017-03-13 – 2017-03-23 (×11): 75 mg via ORAL
  Filled 2017-03-13 (×11): qty 1

## 2017-03-13 MED ORDER — LACTATED RINGERS IV BOLUS (SEPSIS)
2000.0000 mL | Freq: Once | INTRAVENOUS | Status: AC
  Administered 2017-03-13: 1000 mL via INTRAVENOUS

## 2017-03-13 MED ORDER — LEVOTHYROXINE SODIUM 88 MCG PO TABS
88.0000 ug | ORAL_TABLET | Freq: Every day | ORAL | Status: DC
  Administered 2017-03-13 – 2017-03-23 (×11): 88 ug via ORAL
  Filled 2017-03-13 (×12): qty 1

## 2017-03-13 NOTE — ED Notes (Signed)
Family at bedside. Feeding pt breakfast

## 2017-03-13 NOTE — H&P (Signed)
History and Physical    Rachael Johnson FIE:[id number] DOB: Sep 22, 1950 DOA: 03/12/2017  PCP: Mosie Lukes, MD   Patient coming from: Home  Chief Complaint: Found down  HPI: Rachael Johnson is a 66 y.o. female with medical history significant for depression, anxiety, GERD, hypertension, and hypothyroidism, now presenting to the emergency department after being found down by her family. History is obtained through discussion with the ED personnel, patient's family, and review of the EMR. Family had reportedly spoken with the patient at approximately 5:30 PM on 03/11/2017 and she seemed to be in her usual state at that time. After that, she had not been answering her phone and family went to check on her tonight. They found her on the floor, complaining of pain in her chest, abdomen, and legs. She was brought into the ED for evaluation. She was unable to provide a history as to how she ended up on the floor. There is no history of similar presentation per family report. She is not known to use alcohol or illicit substances.  ED Course: Upon arrival to the ED, patient is found to be afebrile, saturating well on room air, and with vital signs stable. EKG features a sinus rhythm and chest x-ray is negative for acute cardiopulmonary disease. Radiographs of the lumbar and thoracic spine are negative. Noncontrast head CT is negative for acute intracranial abnormality. Chemistry panel reveals a sodium of 146, bicarbonate of 16, BUN 73, and serum creatinine of 2.97, previously normal. CBC is notable for a slight leukocytosis to 11,600 and a chronic thrombocytosis with platelets 588,000. Lactic acid is slightly elevated to 2.07 and troponin is elevated to 21.50. Serum CK is elevated to 32,000. Patient was given 1 L of normal saline, 2 L of lactated Ringer's, and cardiology was consulted by the ED physician. Cardiologist indicates that they will see the patient in the emergency department and recommends starting  heparin infusion. CT of the abdomen and pelvis was ordered and remains pending. Patient remains hemodynamically stable and will be admitted to the stepdown unit for ongoing evaluation and management of rhabdomyolysis with marked elevation in serum troponin.  Review of Systems:  Unable to complete ROS secondary to patient's clinical condition with acute encephalopathy.  Past Medical History:  Diagnosis Date  . Anemia, iron deficiency 07/08/2012  . Arthritis   . Cataract 12/21/2014  . Depression with anxiety 03/24/2011  . Diverticulitis   . Esophageal reflux 08/17/2013  . Essential thrombocythemia ([place]) 01/24/2011  . Frequent episodic tension-type headache   . Gastroenteritis 12/21/2014  . 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 GERD (gastroesophageal reflux disease)   . H. pylori infection 12/19/2012  . Hyperglycemia 12/17/2015  . Hypertension   . Other and unspecified hyperlipidemia 02/25/2013  . Restless leg syndrome 09/30/2014  . Scabies 03/28/2015  . Seizure ([place])    childhood  . Thyroid disease     Past Surgical History:  Procedure Laterality Date  . ABDOMINAL HYSTERECTOMY     1992  . BREAST SURGERY  1992   biopsy, benign. Fibrocystic  . UMBILICAL HERNIA REPAIR N/A 09/25/2012   Procedure: HERNIA REPAIR UMBILICAL ADULT;  Surgeon: Harl Bowie, MD;  Location: [facility];  Service: General;  Laterality: N/A;     reports that she quit smoking about 26 years ago. Her smoking use included Cigarettes. She started smoking about 46 years ago. She has a 70.00 pack-year smoking history. She has never used smokeless tobacco. She reports that she does not drink alcohol or  use drugs.  No Known Allergies  Family History  Problem Relation Age of Onset  . Arthritis Mother   . Cancer Mother        ovarian  . Hypertension Mother   . Heart disease Mother        pacer  . Heart failure Mother   . Arthritis Father   . Cancer Father        lung  . Hypertension Father   . Cancer Sister        lung  .  Cancer Brother        prostate  . Cancer Brother        lung  . Hypertension Son   . COPD Brother   . Heart disease Brother   . Hypertension Brother   . Cancer Brother        colon  . Alcohol abuse Brother   . Cirrhosis Brother   . Seizures Sister   . Stroke Sister   . Arthritis Brother      Prior to Admission medications   Medication Sig Start Date End Date Taking? Authorizing Provider  albuterol (VENTOLIN HFA) 108 (90 Base) MCG/ACT inhaler INHALE 2 PUFFS INTO THE LUNGS EVERY 6 (SIX) HOURS AS NEEDED FOR WHEEZING OR SHORTNESS OF BREATH.  Can use Ventolin, ProAir or Proventil whichever cheaper 12/21/16   Mosie Lukes, MD  ALPRAZolam Duanne Moron) 1 MG tablet TAKE 1 TABLET BY MOUTH THREE TIMES A DAY AS NEEDED FOR ANXIETY 02/22/17   Carollee Herter, Alferd Apa, DO  amitriptyline (ELAVIL) 10 MG tablet TAKE 2 TABLETS (20 MG TOTAL) BY MOUTH AT BEDTIME. 03/12/17   Mosie Lukes, MD  aspirin EC 81 MG tablet Take 81 mg by mouth every morning.    [provider]  benzonatate (TESSALON) 200 MG capsule Take 200 mg by mouth as needed. 12/25/16   [provider]  budesonide-formoterol (SYMBICORT) 80-4.5 MCG/ACT inhaler Inhale 2 puffs into the lungs 2 (two) times daily. 12/18/16   Parrett, Fonnie Mu, NP  busPIRone (BUSPAR) 10 MG tablet 10 mg po bid x 7 days then increase to tid 02/12/17   Mosie Lukes, MD  Calcium Carbonate-Vitamin D ([place] PO) Take by mouth.    [provider]  cetirizine (ZYRTEC) 10 MG tablet Take 1 tablet (10 mg total) by mouth daily. 02/12/17   Mosie Lukes, MD  citalopram (CELEXA) 40 MG tablet Take 1 tablet (40 mg total) by mouth every morning. 12/21/16   Mosie Lukes, MD  Cranberry 300 MG tablet Take 300 mg by mouth 2 (two) times daily.    [provider]  cyclobenzaprine (FLEXERIL) 10 MG tablet Take 1 tablet (10 mg total) by mouth 2 (two) times daily as needed for muscle spasms. 12/21/16   Mosie Lukes, MD  estradiol (ESTRACE)  0.5 MG tablet Take 1 tablet (0.5 mg total) by mouth 2 (two) times daily. 02/12/17   Mosie Lukes, MD  fluorometholone (FML) 0.1 % ophthalmic suspension Place 1 drop into both eyes daily as needed (For dry eyes.).  11/14/11   [provider]  fluticasone (FLONASE) 50 MCG/ACT nasal spray Place 2 sprays into both nostrils daily. 12/21/16   Mosie Lukes, MD  furosemide (LASIX) 20 MG tablet 40 mg in am and 20 mg in pm 02/12/17   Mosie Lukes, MD  HYDROcodone-acetaminophen (NORCO) 5-325 MG tablet Take 1 tablet by mouth every 6 (six) hours as needed for severe pain. Take one  tablet by mouth every 6 hours as needed for severe pain 02/12/17   Mosie Lukes, MD  hydroxyurea (HYDREA) 500 MG capsule TAKE 2 CAPSULES BY MOUTH EVERY DAY 02/05/17   [name], MD  lactulose (CHRONULAC) 10 GM/15ML solution TAKE 2 TABLESPOONFULS (30 ML) BY MOUTH 3 TIMES A DAY FOR CONSTIPATION 02/12/17   Mosie Lukes, MD  levothyroxine (SYNTHROID, LEVOTHROID) 88 MCG tablet TAKE 1 TABLET DAILY 02/09/17   Mosie Lukes, MD  meclizine (ANTIVERT) 12.5 MG tablet TAKE 1 TABLET THREE TIMES A DAY AS NEEDED FOR NAUSEA OR DIZZINESS 02/12/17   Mosie Lukes, MD  montelukast (SINGULAIR) 10 MG tablet Take 1 tablet (10 mg total) by mouth at bedtime. 02/12/17   Mosie Lukes, MD  Multiple Vitamin (MULTIVITAMIN PO) Take 1 tablet by mouth every morning.     [provider]  naproxen (NAPROSYN) 500 MG tablet TAKE 1 TABLET (500 MG TOTAL) BY MOUTH 2 (TWO) TIMES DAILY WITH A MEAL. 02/12/17   Mosie Lukes, MD  ondansetron (ZOFRAN) 8 MG tablet TAKE ONE TABLET BY MOUTH EVERY 8 HOURS AS NEEDED NAUSEA AND VOMITING 02/12/17   Mosie Lukes, MD  pantoprazole (PROTONIX) 40 MG tablet Take 1 tablet (40 mg total) by mouth 2 (two) times daily. 02/12/17   Mosie Lukes, MD  pramipexole (MIRAPEX) 0.25 MG tablet TAKE ONE OR TWO TABLETS BY MOUTH AT BEDTIME AS NEEDED 02/12/17   Mosie Lukes, MD  predniSONE (DELTASONE) 10 MG tablet Take   4 each am x 2 days,   2 each am x 2 days,  1 each am x 2 days and stop 03/06/17   Tanda Rockers, MD  promethazine (PHENERGAN) 25 MG tablet Take 1 tablet (25 mg total) by mouth every 6 (six) hours as needed for nausea or vomiting. 02/04/16   [name], MD  ranitidine (ZANTAC) 300 MG tablet Take 1 tablet (300 mg total) by mouth at bedtime. 02/12/17   Mosie Lukes, MD  RESTASIS 0.05 % ophthalmic emulsion Place 1 drop into both eyes 2 (two) times daily.  11/12/15   [provider]  silver sulfADIAZINE (SILVADENE) 1 % cream Apply 1 application topically 2 (two) times daily. 12/21/16   Mosie Lukes, MD  venlafaxine XR (EFFEXOR-XR) 150 MG 24 hr capsule Take 1 capsule (150 mg total) by mouth daily with breakfast. 12/21/16   Mosie Lukes, MD  venlafaxine XR (EFFEXOR-XR) 75 MG 24 hr capsule TAKE ONE CAPSULE BY MOUTH DAILY WITH BREAKFAST 08/28/16   Mosie Lukes, MD  Vitamin D, Ergocalciferol, (DRISDOL) 50000 units CAPS capsule Take 1 capsule (50,000 Units total) by mouth every 7 (seven) days. 02/12/17   Mosie Lukes, MD    Physical Exam: Vitals:   03/12/17 2321 03/12/17 2324 03/12/17 2340  BP:  109/70   Pulse:  75   Resp:  (!) 21   Temp:   97.9 F (36.6 C)  TempSrc:   Oral  SpO2:  97%   Weight: 64.9 kg (143 lb)    Height: 5\' 3"  (1.6 m)        Constitutional: NAD, calm, appears uncomfortable Eyes: PERTLA, lids and conjunctivae normal ENMT: Mucous membranes are moist. Posterior pharynx clear of any exudate or lesions.   Neck: normal, supple, no masses, no thyromegaly Respiratory: clear to auscultation bilaterally, no wheezing, no crackles. Normal respiratory effort.    Cardiovascular: S1 & S2 heard, regular rate and rhythm.  No significant JVD.  Abdomen: No distension, soft. Generally tender, no rebound pain or guarding. Bowel sounds active.  Musculoskeletal: no clubbing / cyanosis. No joint deformity upper and lower extremities.   Skin: no significant rashes, lesions,  ulcers. Warm, dry, well-perfused. Poor turgor.  Neurologic: CN 2-12 grossly intact. Sensation intact, DTR normal. Strength 5/5 in all 4 limbs.  Psychiatric: Alert and oriented to person and place only. Calm and cooperative.     Labs on Admission: I have personally reviewed following labs and imaging studies  CBC:  Recent Labs Lab 03/12/17 2320 03/12/17 2352  WBC 11.6*  --   HGB 12.9 13.6  HCT 37.8 40.0  MCV 122.7*  --   PLT 588*  --    Basic Metabolic Panel:  Recent Labs Lab 03/12/17 2320 03/12/17 2352  NA 146* 147*  K 3.7 3.8  CL 112* 116*  CO2 16*  --   GLUCOSE 117* 110*  BUN 73* 69*  CREATININE 2.97* 2.70*  CALCIUM 8.7*  --    GFR: Estimated Creatinine Clearance: 18.8 mL/min (A) (by C-G formula based on SCr of 2.7 mg/dL (H)). Liver Function Tests:  Recent Labs Lab 03/12/17 2320  AST 876*  ALT 201*  ALKPHOS 62  BILITOT 1.4*  PROT 7.5  ALBUMIN 4.0   No results for input(s): LIPASE, AMYLASE in the last 168 hours. No results for input(s): AMMONIA in the last 168 hours. Coagulation Profile:  Recent Labs Lab 03/13/17 0016  INR 1.23   Cardiac Enzymes:  Recent Labs Lab 03/12/17 2320  CKTOTAL 32,176*  TROPONINI 21.50*   BNP (last 3 results) No results for input(s): PROBNP in the last 8760 hours. HbA1C: No results for input(s): HGBA1C in the last 72 hours. CBG:  Recent Labs Lab 03/12/17 2334  GLUCAP 112*   Lipid Profile: No results for input(s): CHOL, HDL, LDLCALC, TRIG, CHOLHDL, LDLDIRECT in the last 72 hours. Thyroid Function Tests: No results for input(s): TSH, T4TOTAL, FREET4, T3FREE, THYROIDAB in the last 72 hours. Anemia Panel: No results for input(s): VITAMINB12, FOLATE, FERRITIN, TIBC, IRON, RETICCTPCT in the last 72 hours. Urine analysis:    Component Value Date/Time   COLORURINE YELLOW 12/17/2015 0803   APPEARANCEUR CLEAR 12/17/2015 0803   LABSPEC 1.025 12/17/2015 0803   PHURINE 5.5 12/17/2015 0803   GLUCOSEU NEGATIVE  12/17/2015 0803   HGBUR NEGATIVE 12/17/2015 0803   BILIRUBINUR NEGATIVE 12/17/2015 0803   BILIRUBINUR neg 03/20/2014 1008   KETONESUR TRACE (A) 12/17/2015 0803   PROTEINUR neg 03/20/2014 1008   PROTEINUR NEG 01/14/2014 0847   UROBILINOGEN 0.2 12/17/2015 0803   NITRITE NEGATIVE 12/17/2015 0803   LEUKOCYTESUR NEGATIVE 12/17/2015 0803   Sepsis Labs: @LABRCNTIP (procalcitonin:4,lacticidven:4) )No results found for this or any previous visit (from the past 240 hour(s)).   Radiological Exams on Admission: Dg Chest 2 View  Result Date: 03/13/2017 CLINICAL DATA:  Found down for unknown amount of time, with bruising and sores about the chest and back. Initial encounter. EXAM: CHEST  2 VIEW COMPARISON:  Chest radiograph performed 03/06/2017 FINDINGS: The lungs are well-aerated and clear. There is no evidence of focal opacification, pleural effusion or pneumothorax. The heart is normal in size; the mediastinal contour is within normal limits. No acute osseous abnormalities are seen. IMPRESSION: No acute cardiopulmonary process seen. No displaced rib fractures identified. Electronically Signed   By: Garald Balding M.D.   On: 03/13/2017 00:44   Dg Thoracic Spine 2 View  Result Date: 03/13/2017 CLINICAL DATA:  Found down for unknown amount of time, with  upper back pain. Initial encounter. EXAM: THORACIC SPINE 2 VIEWS COMPARISON:  Chest radiograph performed 03/06/2017 FINDINGS: There is no evidence of fracture or subluxation. Vertebral bodies demonstrate normal height and alignment. Intervertebral disc spaces are preserved. The visualized portions of both lungs are clear. The mediastinum is unremarkable in appearance. IMPRESSION: No evidence of fracture or subluxation along the thoracic spine. Electronically Signed   By: Garald Balding M.D.   On: 03/13/2017 00:43   Dg Lumbar Spine Complete  Result Date: 03/13/2017 CLINICAL DATA:  Found down for unknown amount of time, with lower back pain. Initial  encounter. EXAM: LUMBAR SPINE - COMPLETE 4+ VIEW COMPARISON:  CT of the abdomen and pelvis from 07/18/2015 FINDINGS: There is no evidence of fracture or subluxation. Vertebral bodies demonstrate normal height and alignment. Intervertebral disc spaces are preserved. The visualized neural foramina are grossly unremarkable in appearance. The visualized bowel gas pattern is unremarkable in appearance; air and stool are noted within the colon. The sacroiliac joints are within normal limits. IMPRESSION: No evidence of fracture or subluxation along the lumbar spine. Electronically Signed   By: Garald Balding M.D.   On: 03/13/2017 00:42   Ct Head Wo Contrast  Result Date: 03/13/2017 CLINICAL DATA:  Altered level of consciousness.  Patient found down. EXAM: CT HEAD WITHOUT CONTRAST CT CERVICAL SPINE WITHOUT CONTRAST TECHNIQUE: Multidetector CT imaging of the head and cervical spine was performed following the standard protocol without intravenous contrast. Multiplanar CT image reconstructions of the cervical spine were also generated. COMPARISON:  Head and cervical spine CT 07/18/2015 FINDINGS: CT HEAD FINDINGS Brain: Generalized cerebral atrophy.No intracranial hemorrhage, mass effect, or midline shift. No hydrocephalus. The basilar cisterns are patent. No evidence of territorial infarct. No extra-axial or intracranial fluid collection. Vascular: Atherosclerosis of skullbase vasculature without hyperdense vessel or abnormal calcification. Skull: No skull fracture.  No focal lesion. Sinuses/Orbits: Mucosal thickening of scattered ethmoid air cells. Fluid level in right and left sphenoid sinus and right maxillary sinus. Mastoid air cells are well-aerated. Visualized orbits are unremarkable. Other: None. CT CERVICAL SPINE FINDINGS Alignment: Trace anterolisthesis of C3 on C4 is unchanged from prior exam. There is widening and irregularity of the left C3-C4 facet that is new from prior exam but appears chronic. No traumatic  subluxation. Skull base and vertebrae: No acute fracture. Vertebral body heights are maintained. Skull base and dens are intact. Soft tissues and spinal canal: No prevertebral fluid or swelling. No visible canal hematoma. Disc levels: Disc space narrowing and endplate spurring at D3-O6 and to a lesser extent C4-C5. Upper chest: A few scattered ground-glass opacities at the left greater than right lung apex, new from chest CT 01/11/2017 Other: None. IMPRESSION: 1.  No acute intracranial abnormality.  No skull fracture. 2. No acute fracture or subluxation of the cervical spine. 3. Widening of the left C3-C4 facet with associated joint space irregularity suggests inflammatory or degenerative arthropathy. This does not appear traumatic. MRI could be considered if there is history of neck pain. 4. Nonspecific left upper lobe ground-glass opacities, atypical infection versus pulmonary edema. Electronically Signed   By: Jeb Levering M.D.   On: 03/13/2017 01:00   Ct Cervical Spine Wo Contrast  Result Date: 03/13/2017 CLINICAL DATA:  Altered level of consciousness.  Patient found down. EXAM: CT HEAD WITHOUT CONTRAST CT CERVICAL SPINE WITHOUT CONTRAST TECHNIQUE: Multidetector CT imaging of the head and cervical spine was performed following the standard protocol without intravenous contrast. Multiplanar CT image reconstructions of the cervical spine were  also generated. COMPARISON:  Head and cervical spine CT 07/18/2015 FINDINGS: CT HEAD FINDINGS Brain: Generalized cerebral atrophy.No intracranial hemorrhage, mass effect, or midline shift. No hydrocephalus. The basilar cisterns are patent. No evidence of territorial infarct. No extra-axial or intracranial fluid collection. Vascular: Atherosclerosis of skullbase vasculature without hyperdense vessel or abnormal calcification. Skull: No skull fracture.  No focal lesion. Sinuses/Orbits: Mucosal thickening of scattered ethmoid air cells. Fluid level in right and left  sphenoid sinus and right maxillary sinus. Mastoid air cells are well-aerated. Visualized orbits are unremarkable. Other: None. CT CERVICAL SPINE FINDINGS Alignment: Trace anterolisthesis of C3 on C4 is unchanged from prior exam. There is widening and irregularity of the left C3-C4 facet that is new from prior exam but appears chronic. No traumatic subluxation. Skull base and vertebrae: No acute fracture. Vertebral body heights are maintained. Skull base and dens are intact. Soft tissues and spinal canal: No prevertebral fluid or swelling. No visible canal hematoma. Disc levels: Disc space narrowing and endplate spurring at T6-L4 and to a lesser extent C4-C5. Upper chest: A few scattered ground-glass opacities at the left greater than right lung apex, new from chest CT 01/11/2017 Other: None. IMPRESSION: 1.  No acute intracranial abnormality.  No skull fracture. 2. No acute fracture or subluxation of the cervical spine. 3. Widening of the left C3-C4 facet with associated joint space irregularity suggests inflammatory or degenerative arthropathy. This does not appear traumatic. MRI could be considered if there is history of neck pain. 4. Nonspecific left upper lobe ground-glass opacities, atypical infection versus pulmonary edema. Electronically Signed   By: Jeb Levering M.D.   On: 03/13/2017 01:00    EKG: Independently reviewed. Normal sinus rhythm.   Assessment/Plan  1. Rhabdomyolysis  - Pt found down by family, having last made contact with her more than a day earlier  - Events leading up to this are unclear - Serum CK elevated to 32,000 on admission  - She was fluid-resuscitated in ED with 1 liter NS and 2 liters LR   - Plan to continue IVF hydration, follow chem panels, consider bicarbonate therapy    2. Acute renal failure  - SCr is 2.97 on admission, previously wnl  - She is clinically dehydrated and was treated with 3 liters IVF in ED  - CT abd/pelvis pending  - Continue IVF hydration,  avoid nephrotoxins, renally-dose medications, repeat chem panel    3. Elevated troponin  - Troponin is elevated to 21.50 on arrival  - No hx of CAD, no anginal complaints, EKG without acute ischemic features  - Cardiology is consulting and much appreciated  - Continue cardiac monitoring, continue heparin infusion, trend troponin, repeat EKG    4. Elevated transaminases  - AST is 876, ALT 201  - Abd is tender and CT abd/pelvis is pending - Likely secondary to rhabdomyolysis, will follow-up CT    5. Hypernatremia  - Secondary to dehydration   - Continue IVF, repeat chem panel in am    6. Depression, anxiety  - Difficult to assess given the clinical condition - Resume antidepressants as tolerated    8. Hypothyroidism  - Check TSH given her presentation - Continue Synthroid     DVT prophylaxis: heparin infusion  Code Status: Full  Family Communication: Discussed with patient Disposition Plan: Admit to SDU Consults called: Cardiology Admission status: Inpatient    Vianne Bulls, MD [facility] Pager [phone]  If 7PM-7AM, please contact night-coverage [email or web address] Password [email or web address]  03/13/2017, 1:23 AM

## 2017-03-13 NOTE — ED Notes (Signed)
Family at bedside. Aware patient is waiting on inpatient bed. Patient is alert oriented. C/o generalized pain all over.

## 2017-03-13 NOTE — Progress Notes (Signed)
ANTICOAGULATION CONSULT NOTE  Pharmacy Consult for heparin  Indication: chest pain/ACS  No Known Allergies  Patient Measurements: Height: 5\' 3"  (160 cm) Weight: 143 lb (64.9 kg) IBW/kg (Calculated) : 52.4 Heparin Dosing Weight: 65 kg   Vital Signs: Temp: 98.6 F (37 C) (08/14 2047) Temp Source: Oral (08/14 2047) BP: 106/72 (08/14 2047) Pulse Rate: 81 (08/14 2047)  Labs:  Recent Labs  03/12/17 2320 03/12/17 2352 03/13/17 0016  03/13/17 0338 03/13/17 0720 03/13/17 1047 03/13/17 1347 03/13/17 2046  HGB 12.9 13.6  --   --   --   --   --   --  11.0*  HCT 37.8 40.0  --   --   --   --   --   --  33.3*  PLT 588*  --   --   --   --   --   --   --  554*  APTT  --   --  29  --   --   --   --   --   --   LABPROT  --   --  15.6*  --   --   --   --   --   --   INR  --   --  1.23  --   --   --   --   --   --   HEPARINUNFRC  --   --   --   --   --   --  0.17*  --  0.35  CREATININE 2.97* 2.70*  --   --   --  2.59*  --   --   --   CKTOTAL 32,176*  --   --   --   --   --   --   --   --   TROPONINI 21.50*  --   --   < > 16.09* 14.51*  --  10.38*  --   < > = values in this interval not displayed.  Estimated Creatinine Clearance: 19.6 mL/min (A) (by C-G formula based on SCr of 2.59 mg/dL (H)).   Assessment: 66 yo female admitted with AMS/fall. Patient also with elevated troponins and chest pain; pharmacy consulted to dose heparin. No PTA oral anticoagulation per med rec.   No acute abnormality per CT head report. Hgb normal and platelets elevated. Noted history of thrombocytopenia, as well as documentation by MD of diffuse ecchymoses type skin lesions and skin tears.  PM heparin level = 0.35  Goal of Therapy:  Heparin level 0.3-0.7 units/ml Monitor platelets by anticoagulation protocol: Yes   Plan:  Continue heparin at 1000 units / hr Daily heparin level and CBC  Monitor for s/s bleeding   Thank you Anette Guarneri, PharmD [phone] 03/13/2017 9:30 PM

## 2017-03-13 NOTE — Progress Notes (Signed)
Patient seen and examined. Admitted after midnight secondary to rhabdomyolysis, AKI and elevated troponin. Patient complaining of pain all over currently, no SOB. AAOX3. On telemetry evaluation normal sinus rhythm, and non-specific flattening and inversion of T waves. Patient apparently had an episode of syncope and collapse and was found down by family. Troponin up to 21; CK in 32,000 and AKI with Cr of 2.5 (normal Cr 4 weeks ago). Patient has been started on aggressive IVF's and supportive care. Cardiology consulted and recommended heparin drip (in case of NSTEMI), will also follow 2-D echo and monitor on telemetry. Patient will most likely need heart cath once renal function improved. Please refer to H&P written by Dr. Myna Hidalgo for further info/details on admission.  Barton Dubois MD [phone]

## 2017-03-13 NOTE — Progress Notes (Signed)
ANTICOAGULATION CONSULT NOTE - Initial Consult  Pharmacy Consult for heparin  Indication: chest pain/ACS  No Known Allergies  Patient Measurements: Height: 5\' 3"  (160 cm) Weight: 143 lb (64.9 kg) IBW/kg (Calculated) : 52.4 Heparin Dosing Weight: 65 kg   Vital Signs: BP: 104/62 (08/14 1230) Pulse Rate: 68 (08/14 1230)  Labs:  Recent Labs  03/12/17 2320 03/12/17 2352 03/13/17 0016 03/13/17 0231 03/13/17 0338 03/13/17 0720 03/13/17 1047  HGB 12.9 13.6  --   --   --   --   --   HCT 37.8 40.0  --   --   --   --   --   PLT 588*  --   --   --   --   --   --   APTT  --   --  29  --   --   --   --   LABPROT  --   --  15.6*  --   --   --   --   INR  --   --  1.23  --   --   --   --   HEPARINUNFRC  --   --   --   --   --   --  0.17*  CREATININE 2.97* 2.70*  --   --   --  2.59*  --   CKTOTAL 32,176*  --   --   --   --   --   --   TROPONINI 21.50*  --   --  16.65* 16.09* 14.51*  --     Estimated Creatinine Clearance: 19.6 mL/min (A) (by C-G formula based on SCr of 2.59 mg/dL (H)).   Assessment: 66 yo female admitted with AMS/fall. Patient also with elevated troponins and chest pain; pharmacy consulted to dose heparin. No PTA oral anticoagulation per med rec.   No acute abnormality per CT head report. Hgb normal and platelets elevated. Noted history of thrombocytopenia, as well as documentation by MD of diffuse ecchymoses type skin lesions and skin tears.  No other overt s/s bleeding noted. Will give slightly conservative bolus in setting of MD's physical exam as mentioned above as well as notable AKI with SCr 2.7.   Goal of Therapy:  Heparin level 0.3-0.7 units/ml Monitor platelets by anticoagulation protocol: Yes   Plan:  Increase heparin gtt to 1000 units/hr (15 units/kg/hr) Heparin level in 8 hours Daily heparin level and CBC  Monitor for s/s bleeding   Georga Bora, PharmD Clinical Pharmacist 03/13/2017 12:37 PM

## 2017-03-13 NOTE — Progress Notes (Signed)
ANTICOAGULATION CONSULT NOTE - Initial Consult  Pharmacy Consult for heparin  Indication: chest pain/ACS  No Known Allergies  Patient Measurements: Height: 5\' 3"  (160 cm) Weight: 143 lb (64.9 kg) IBW/kg (Calculated) : 52.4 Heparin Dosing Weight: 65 kg   Vital Signs: Temp: 97.9 F (36.6 C) (08/13 2340) Temp Source: Oral (08/13 2340) BP: 109/60 (08/14 0045) Pulse Rate: 69 (08/14 0045)  Labs:  Recent Labs  03/12/17 2320 03/12/17 2352 03/13/17 0016  HGB 12.9 13.6  --   HCT 37.8 40.0  --   PLT 588*  --   --   APTT  --   --  29  LABPROT  --   --  15.6*  INR  --   --  1.23  CREATININE 2.97* 2.70*  --   CKTOTAL 32,176*  --   --   TROPONINI 21.50*  --   --     Estimated Creatinine Clearance: 18.8 mL/min (A) (by C-G formula based on SCr of 2.7 mg/dL (H)).   Assessment: 66 yo female admitted with AMS/fall. Patient also with elevated troponins and chest pain; pharmacy consulted to dose heparin. No PTA oral anticoagulation per med rec.   No acute abnormality per CT head report. Hgb normal and platelets elevated. Noted history of thrombocytopenia, as well as documentation by MD of diffuse ecchymoses type skin lesions and skin tears.  No other overt s/s bleeding noted. Will give slightly conservative bolus in setting of MD's physical exam as mentioned above as well as notable AKI with SCr 2.7.   Goal of Therapy:  Heparin level 0.3-0.7 units/ml Monitor platelets by anticoagulation protocol: Yes   Plan:  Bolus heparin 3000 units x1 Start heparin gtt at 800 units/hr  Heparin level in 8 hours Daily heparin level and CBC  Monitor for s/s bleeding   Argie Ramming, PharmD Clinical Pharmacist 03/13/17 1:35 AM

## 2017-03-13 NOTE — ED Provider Notes (Addendum)
[facility] DEPT Provider Note   CSN: [id number] Arrival date & time: 03/12/17  2312     History   Chief Complaint Chief Complaint  Patient presents with  . Fall  . Altered Mental Status    HPI Rachael Johnson is a 66 y.o. female.  HPI Level 5 caveat for altered mental status.  Pt comes in with cc of fall / ams. Pt has hx of COPD, thrombocytopenia. She was brought here by EMS. Pt is not sure why she was brought in the ER. She is oriented to self and place. She complains of headaches, neck pain, back pain. Pt denies any fevers, chills, cough. She denies any pain with urination. Per EMS pt lives alone.  Past Medical History:  Diagnosis Date  . Anemia, iron deficiency 07/08/2012  . Arthritis   . Cataract 12/21/2014  . Depression with anxiety 03/24/2011  . Diverticulitis   . Esophageal reflux 08/17/2013  . Essential thrombocythemia ([place]) 01/24/2011  . Frequent episodic tension-type headache   . Gastroenteritis 12/21/2014  . 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 GERD (gastroesophageal reflux disease)   . H. pylori infection 12/19/2012  . Hyperglycemia 12/17/2015  . Hypertension   . Other and unspecified hyperlipidemia 02/25/2013  . Restless leg syndrome 09/30/2014  . Scabies 03/28/2015  . Seizure ([place])    childhood  . Thyroid disease     Patient Active Problem List   Diagnosis Date Noted  . AKI (acute kidney injury) ([place]) 03/13/2017  . LFTs abnormal 03/13/2017  . Rhabdomyolysis 03/13/2017  . Elevated troponin 03/13/2017  . Hypernatremia 03/13/2017  . Dyspnea on exertion 01/23/2017  . Lung nodule 12/18/2016  . Bronchiectasis assoc with MPNs 11/28/2016  . Cough variant asthma vs UACS 11/27/2016  . Cough 04/18/2016  . Vitamin D deficiency 03/07/2016  . Dysuria 03/07/2016  . Essential hypertension 03/07/2016  . Pedal edema 03/07/2016  . Benign paroxysmal positional vertigo 03/07/2016  . Hyperglycemia 12/17/2015  . Chest pain 12/24/2014  . Cataract 12/21/2014  . Restless leg  syndrome 09/30/2014  . Allergic state 04/27/2014  . Constipation 11/24/2013  . Preventative health care 04/20/2013  . Hyperlipidemia 02/25/2013  . H. pylori infection 12/19/2012  . Umbilical hernia 46/80/3212  . Macrocytic anemia 07/08/2012  . Rash 02/23/2012  . Weight loss 12/29/2011  . Depression with anxiety 03/24/2011  . 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 Hypothyroid 02/06/2011  . Essential thrombocythemia ([place]) 01/24/2011    Past Surgical History:  Procedure Laterality Date  . ABDOMINAL HYSTERECTOMY     1992  . BREAST SURGERY  1992   biopsy, benign. Fibrocystic  . UMBILICAL HERNIA REPAIR N/A 09/25/2012   Procedure: HERNIA REPAIR UMBILICAL ADULT;  Surgeon: Harl Bowie, MD;  Location: [facility] ORS;  Service: General;  Laterality: N/A;    OB History    Gravida Para Term Preterm AB Living   3 2     1 2    SAB TAB Ectopic Multiple Live Births   1               Home Medications    Prior to Admission medications   Medication Sig Start Date End Date Taking? Authorizing Provider  albuterol (VENTOLIN HFA) 108 (90 Base) MCG/ACT inhaler INHALE 2 PUFFS INTO THE LUNGS EVERY 6 (SIX) HOURS AS NEEDED FOR WHEEZING OR SHORTNESS OF BREATH.  Can use Ventolin, ProAir or Proventil whichever cheaper 12/21/16   Mosie Lukes, MD  ALPRAZolam Duanne Moron) 1 MG tablet TAKE 1 TABLET BY MOUTH THREE TIMES A DAY  AS NEEDED FOR ANXIETY 02/22/17   Carollee Herter, Alferd Apa, DO  amitriptyline (ELAVIL) 10 MG tablet TAKE 2 TABLETS (20 MG TOTAL) BY MOUTH AT BEDTIME. 03/12/17   Mosie Lukes, MD  aspirin EC 81 MG tablet Take 81 mg by mouth every morning.    [provider]  benzonatate (TESSALON) 200 MG capsule Take 200 mg by mouth as needed. 12/25/16   [provider]  budesonide-formoterol (SYMBICORT) 80-4.5 MCG/ACT inhaler Inhale 2 puffs into the lungs 2 (two) times daily. 12/18/16   Parrett, Fonnie Mu, NP  busPIRone (BUSPAR) 10 MG tablet 10 mg po bid x 7 days then increase to tid 02/12/17   Mosie Lukes, MD  Calcium Carbonate-Vitamin D ([place] PO) Take by mouth.    [provider]  cetirizine (ZYRTEC) 10 MG tablet Take 1 tablet (10 mg total) by mouth daily. 02/12/17   Mosie Lukes, MD  citalopram (CELEXA) 40 MG tablet Take 1 tablet (40 mg total) by mouth every morning. 12/21/16   Mosie Lukes, MD  Cranberry 300 MG tablet Take 300 mg by mouth 2 (two) times daily.    [provider]  cyclobenzaprine (FLEXERIL) 10 MG tablet Take 1 tablet (10 mg total) by mouth 2 (two) times daily as needed for muscle spasms. 12/21/16   Mosie Lukes, MD  estradiol (ESTRACE) 0.5 MG tablet Take 1 tablet (0.5 mg total) by mouth 2 (two) times daily. 02/12/17   Mosie Lukes, MD  fluorometholone (FML) 0.1 % ophthalmic suspension Place 1 drop into both eyes daily as needed (For dry eyes.).  11/14/11   [provider]  fluticasone (FLONASE) 50 MCG/ACT nasal spray Place 2 sprays into both nostrils daily. 12/21/16   Mosie Lukes, MD  furosemide (LASIX) 20 MG tablet 40 mg in am and 20 mg in pm 02/12/17   Mosie Lukes, MD  HYDROcodone-acetaminophen (NORCO) 5-325 MG tablet Take 1 tablet by mouth every 6 (six) hours as needed for severe pain. Take one tablet by mouth every 6 hours as needed for severe pain 02/12/17   Mosie Lukes, MD  hydroxyurea (HYDREA) 500 MG capsule TAKE 2 CAPSULES BY MOUTH EVERY DAY 02/05/17   [name], MD  lactulose (CHRONULAC) 10 GM/15ML solution TAKE 2 TABLESPOONFULS (30 ML) BY MOUTH 3 TIMES A DAY FOR CONSTIPATION 02/12/17   Mosie Lukes, MD  levothyroxine (SYNTHROID, LEVOTHROID) 88 MCG tablet TAKE 1 TABLET DAILY 02/09/17   Mosie Lukes, MD  meclizine (ANTIVERT) 12.5 MG tablet TAKE 1 TABLET THREE TIMES A DAY AS NEEDED FOR NAUSEA OR DIZZINESS 02/12/17   Mosie Lukes, MD  montelukast (SINGULAIR) 10 MG tablet Take 1 tablet (10 mg total) by mouth at bedtime. 02/12/17   Mosie Lukes, MD  Multiple Vitamin (MULTIVITAMIN PO) Take 1 tablet  by mouth every morning.     [provider]  naproxen (NAPROSYN) 500 MG tablet TAKE 1 TABLET (500 MG TOTAL) BY MOUTH 2 (TWO) TIMES DAILY WITH A MEAL. 02/12/17   Mosie Lukes, MD  ondansetron (ZOFRAN) 8 MG tablet TAKE ONE TABLET BY MOUTH EVERY 8 HOURS AS NEEDED NAUSEA AND VOMITING 02/12/17   Mosie Lukes, MD  pantoprazole (PROTONIX) 40 MG tablet Take 1 tablet (40 mg total) by mouth 2 (two) times daily. 02/12/17   Mosie Lukes, MD  pramipexole (MIRAPEX) 0.25 MG tablet TAKE ONE OR TWO TABLETS BY MOUTH AT BEDTIME AS NEEDED 02/12/17   Charlett Blake,  Bonnita Levan, MD  predniSONE (DELTASONE) 10 MG tablet Take  4 each am x 2 days,   2 each am x 2 days,  1 each am x 2 days and stop 03/06/17   Tanda Rockers, MD  promethazine (PHENERGAN) 25 MG tablet Take 1 tablet (25 mg total) by mouth every 6 (six) hours as needed for nausea or vomiting. 02/04/16   [name], MD  ranitidine (ZANTAC) 300 MG tablet Take 1 tablet (300 mg total) by mouth at bedtime. 02/12/17   Mosie Lukes, MD  RESTASIS 0.05 % ophthalmic emulsion Place 1 drop into both eyes 2 (two) times daily.  11/12/15   [provider]  silver sulfADIAZINE (SILVADENE) 1 % cream Apply 1 application topically 2 (two) times daily. 12/21/16   Mosie Lukes, MD  venlafaxine XR (EFFEXOR-XR) 150 MG 24 hr capsule Take 1 capsule (150 mg total) by mouth daily with breakfast. 12/21/16   Mosie Lukes, MD  venlafaxine XR (EFFEXOR-XR) 75 MG 24 hr capsule TAKE ONE CAPSULE BY MOUTH DAILY WITH BREAKFAST 08/28/16   Mosie Lukes, MD  Vitamin D, Ergocalciferol, (DRISDOL) 50000 units CAPS capsule Take 1 capsule (50,000 Units total) by mouth every 7 (seven) days. 02/12/17   Mosie Lukes, MD    Family History Family History  Problem Relation Age of Onset  . Arthritis Mother   . Cancer Mother        ovarian  . Hypertension Mother   . Heart disease Mother        pacer  . Heart failure Mother   . Arthritis Father   . Cancer Father        lung  .  Hypertension Father   . Cancer Sister        lung  . Cancer Brother        prostate  . Cancer Brother        lung  . Hypertension Son   . COPD Brother   . Heart disease Brother   . Hypertension Brother   . Cancer Brother        colon  . Alcohol abuse Brother   . Cirrhosis Brother   . Seizures Sister   . Stroke Sister   . Arthritis Brother     Social History Social History  Substance Use Topics  . Smoking status: Former Smoker    Packs/day: 3.50    Years: 20.00    Types: Cigarettes    Start date: 11/15/1970    Quit date: 07/31/1990  . Smokeless tobacco: Never Used     Comment: quit 23 years ago  . Alcohol use No     Allergies   Patient has no known allergies.   Review of Systems Review of Systems  Unable to perform ROS: Mental status change     Physical Exam Updated Vital Signs BP 109/70 (BP Location: Right Arm)   Pulse 75   Temp 97.9 F (36.6 C) (Oral)   Resp (!) 21   Ht 5\' 3"  (1.6 m)   Wt 64.9 kg (143 lb)   LMP 07/31/1990   SpO2 97%   BMI 25.33 kg/m   Physical Exam  Constitutional:  dry  HENT:  Head: Normocephalic and atraumatic.  Scalp has a large ulceration at the vertex. No active bleeding  Eyes: Pupils are equal, round, and reactive to light. EOM are normal.  Neck: Neck supple.  Pt has midline cspine tednerness  Cardiovascular: Normal rate.   Pulmonary/Chest: Effort  normal.  Abdominal: Bowel sounds are normal. There is tenderness.  Neurological: She is alert. No cranial nerve deficit.  Oriented to self and location  Skin: Skin is warm and dry. Rash noted. There is erythema.  Pt has a large L skin ulceration to the L superior shoulder.  Generalized purpura diffusely  Nursing note and vitals reviewed.   ED Treatments / Results  Labs (all labs ordered are listed, but only abnormal results are displayed) Labs Reviewed  COMPREHENSIVE METABOLIC PANEL - Abnormal; Notable for the following:       Result Value   Sodium 146 (*)     Chloride 112 (*)    CO2 16 (*)    Glucose, Bld 117 (*)    BUN 73 (*)    Creatinine, Ser 2.97 (*)    Calcium 8.7 (*)    AST 876 (*)    ALT 201 (*)    Total Bilirubin 1.4 (*)    GFR calc non Af Amer 16 (*)    GFR calc Af Amer 18 (*)    Anion gap 18 (*)    All other components within normal limits  CBC - Abnormal; Notable for the following:    WBC 11.6 (*)    RBC 3.08 (*)    MCV 122.7 (*)    MCH 41.9 (*)    Platelets 588 (*)    All other components within normal limits  CK - Abnormal; Notable for the following:    Total CK 32,176 (*)    All other components within normal limits  TROPONIN I - Abnormal; Notable for the following:    Troponin I 21.50 (*)    All other components within normal limits  PROTIME-INR - Abnormal; Notable for the following:    Prothrombin Time 15.6 (*)    All other components within normal limits  CBG MONITORING, ED - Abnormal; Notable for the following:    Glucose-Capillary 112 (*)    All other components within normal limits  I-STAT CG4 LACTIC ACID, ED - Abnormal; Notable for the following:    Lactic Acid, Venous 2.07 (*)    All other components within normal limits  I-STAT CHEM 8, ED - Abnormal; Notable for the following:    Sodium 147 (*)    Chloride 116 (*)    BUN 69 (*)    Creatinine, Ser 2.70 (*)    Glucose, Bld 110 (*)    Calcium, Ion 0.96 (*)    All other components within normal limits  APTT  I-STAT VENOUS BLOOD GAS, ED    EKG  EKG Interpretation None       Radiology Dg Chest 2 View  Result Date: 03/13/2017 CLINICAL DATA:  Found down for unknown amount of time, with bruising and sores about the chest and back. Initial encounter. EXAM: CHEST  2 VIEW COMPARISON:  Chest radiograph performed 03/06/2017 FINDINGS: The lungs are well-aerated and clear. There is no evidence of focal opacification, pleural effusion or pneumothorax. The heart is normal in size; the mediastinal contour is within normal limits. No acute osseous abnormalities  are seen. IMPRESSION: No acute cardiopulmonary process seen. No displaced rib fractures identified. Electronically Signed   By: Garald Balding M.D.   On: 03/13/2017 00:44   Dg Thoracic Spine 2 View  Result Date: 03/13/2017 CLINICAL DATA:  Found down for unknown amount of time, with upper back pain. Initial encounter. EXAM: THORACIC SPINE 2 VIEWS COMPARISON:  Chest radiograph performed 03/06/2017 FINDINGS: There is no evidence of  fracture or subluxation. Vertebral bodies demonstrate normal height and alignment. Intervertebral disc spaces are preserved. The visualized portions of both lungs are clear. The mediastinum is unremarkable in appearance. IMPRESSION: No evidence of fracture or subluxation along the thoracic spine. Electronically Signed   By: Garald Balding M.D.   On: 03/13/2017 00:43   Dg Lumbar Spine Complete  Result Date: 03/13/2017 CLINICAL DATA:  Found down for unknown amount of time, with lower back pain. Initial encounter. EXAM: LUMBAR SPINE - COMPLETE 4+ VIEW COMPARISON:  CT of the abdomen and pelvis from 07/18/2015 FINDINGS: There is no evidence of fracture or subluxation. Vertebral bodies demonstrate normal height and alignment. Intervertebral disc spaces are preserved. The visualized neural foramina are grossly unremarkable in appearance. The visualized bowel gas pattern is unremarkable in appearance; air and stool are noted within the colon. The sacroiliac joints are within normal limits. IMPRESSION: No evidence of fracture or subluxation along the lumbar spine. Electronically Signed   By: Garald Balding M.D.   On: 03/13/2017 00:42   Ct Head Wo Contrast  Result Date: 03/13/2017 CLINICAL DATA:  Altered level of consciousness.  Patient found down. EXAM: CT HEAD WITHOUT CONTRAST CT CERVICAL SPINE WITHOUT CONTRAST TECHNIQUE: Multidetector CT imaging of the head and cervical spine was performed following the standard protocol without intravenous contrast. Multiplanar CT image reconstructions  of the cervical spine were also generated. COMPARISON:  Head and cervical spine CT 07/18/2015 FINDINGS: CT HEAD FINDINGS Brain: Generalized cerebral atrophy.No intracranial hemorrhage, mass effect, or midline shift. No hydrocephalus. The basilar cisterns are patent. No evidence of territorial infarct. No extra-axial or intracranial fluid collection. Vascular: Atherosclerosis of skullbase vasculature without hyperdense vessel or abnormal calcification. Skull: No skull fracture.  No focal lesion. Sinuses/Orbits: Mucosal thickening of scattered ethmoid air cells. Fluid level in right and left sphenoid sinus and right maxillary sinus. Mastoid air cells are well-aerated. Visualized orbits are unremarkable. Other: None. CT CERVICAL SPINE FINDINGS Alignment: Trace anterolisthesis of C3 on C4 is unchanged from prior exam. There is widening and irregularity of the left C3-C4 facet that is new from prior exam but appears chronic. No traumatic subluxation. Skull base and vertebrae: No acute fracture. Vertebral body heights are maintained. Skull base and dens are intact. Soft tissues and spinal canal: No prevertebral fluid or swelling. No visible canal hematoma. Disc levels: Disc space narrowing and endplate spurring at Z3-G6 and to a lesser extent C4-C5. Upper chest: A few scattered ground-glass opacities at the left greater than right lung apex, new from chest CT 01/11/2017 Other: None. IMPRESSION: 1.  No acute intracranial abnormality.  No skull fracture. 2. No acute fracture or subluxation of the cervical spine. 3. Widening of the left C3-C4 facet with associated joint space irregularity suggests inflammatory or degenerative arthropathy. This does not appear traumatic. MRI could be considered if there is history of neck pain. 4. Nonspecific left upper lobe ground-glass opacities, atypical infection versus pulmonary edema. Electronically Signed   By: Jeb Levering M.D.   On: 03/13/2017 01:00   Ct Cervical Spine Wo  Contrast  Result Date: 03/13/2017 CLINICAL DATA:  Altered level of consciousness.  Patient found down. EXAM: CT HEAD WITHOUT CONTRAST CT CERVICAL SPINE WITHOUT CONTRAST TECHNIQUE: Multidetector CT imaging of the head and cervical spine was performed following the standard protocol without intravenous contrast. Multiplanar CT image reconstructions of the cervical spine were also generated. COMPARISON:  Head and cervical spine CT 07/18/2015 FINDINGS: CT HEAD FINDINGS Brain: Generalized cerebral atrophy.No intracranial hemorrhage, mass effect,  or midline shift. No hydrocephalus. The basilar cisterns are patent. No evidence of territorial infarct. No extra-axial or intracranial fluid collection. Vascular: Atherosclerosis of skullbase vasculature without hyperdense vessel or abnormal calcification. Skull: No skull fracture.  No focal lesion. Sinuses/Orbits: Mucosal thickening of scattered ethmoid air cells. Fluid level in right and left sphenoid sinus and right maxillary sinus. Mastoid air cells are well-aerated. Visualized orbits are unremarkable. Other: None. CT CERVICAL SPINE FINDINGS Alignment: Trace anterolisthesis of C3 on C4 is unchanged from prior exam. There is widening and irregularity of the left C3-C4 facet that is new from prior exam but appears chronic. No traumatic subluxation. Skull base and vertebrae: No acute fracture. Vertebral body heights are maintained. Skull base and dens are intact. Soft tissues and spinal canal: No prevertebral fluid or swelling. No visible canal hematoma. Disc levels: Disc space narrowing and endplate spurring at U3-J4 and to a lesser extent C4-C5. Upper chest: A few scattered ground-glass opacities at the left greater than right lung apex, new from chest CT 01/11/2017 Other: None. IMPRESSION: 1.  No acute intracranial abnormality.  No skull fracture. 2. No acute fracture or subluxation of the cervical spine. 3. Widening of the left C3-C4 facet with associated joint space  irregularity suggests inflammatory or degenerative arthropathy. This does not appear traumatic. MRI could be considered if there is history of neck pain. 4. Nonspecific left upper lobe ground-glass opacities, atypical infection versus pulmonary edema. Electronically Signed   By: Jeb Levering M.D.   On: 03/13/2017 01:00    Procedures Procedures (including critical care time)  CRITICAL CARE Performed by: Varney Biles   Total critical care time: 48 minutes  Critical care time was exclusive of separately billable procedures and treating other patients.  Critical care was necessary to treat or prevent imminent or life-threatening deterioration.  Critical care was time spent personally by me on the following activities: development of treatment plan with patient and/or surrogate as well as nursing, discussions with consultants, evaluation of patient's response to treatment, examination of patient, obtaining history from patient or surrogate, ordering and performing treatments and interventions, ordering and review of laboratory studies, ordering and review of radiographic studies, pulse oximetry and re-evaluation of patient's condition.   Medications Ordered in ED Medications  lactated ringers bolus 1,000 mL (1,000 mLs Intravenous New Bag/Given 03/12/17 2353)  lactated ringers bolus 2,000 mL (not administered)     Initial Impression / Assessment and Plan / ED Course  I have reviewed the triage vital signs and the nursing notes.  Pertinent labs & imaging results that were available during my care of the patient were reviewed by me and considered in my medical decision making (see chart for details).  Clinical Course as of Mar 13 114  Tue Mar 13, 2017  0111 Elevated CK, elevated Cr, Trop, LFTs. We will start hydration. Venous blood gas added. Pt continues to c/o chest pain and back pain. She also continues to c/o abd pain. CT abd ordered. R/o AAA and any internal  bleed/infection.  We are still not sure what led to her fall. Syncope is likely.  Cards consulted for the trop. Dr. Percival Spanish reviewed the EKG. EKG x 2 here show subtle ST elevation in avL and St depression. No dynamic changes. Cards have recommended heparin if there is no contraindications. Meidcine to admit. 3 liters of LR ordered. CK Total: (!) 32,176 [AN]  0114 Family arrived and informs that pt was last seen normal 5:30 on Sat evening.  [AN]  Clinical Course User Index [AN] Varney Biles, MD    Pt comes in with AMS / Fall She is noted to have diffuse ecchymoses type skin lesions and skin tears. It almost appears that pt has been on the floor for a long while. The area of ulceration are peculiar -the top of the shoulder, vertex of the scalp are not typical pressure points. Hx is not very good.  DDx includes: ICH / Stroke ACS Sepsis syndrome Thrombocytopenia Encephalopathy  Electrolyte abnormality Drug overdose Metabolic disorders including thyroid disorders, adrenal insufficiency Cancer of unknown origin / paraneoplastic process Hypercapnia / COPD Fracture/break Rhabdomylosis  Plan is to get basic labs. Pt appears very dry, and I am sure she has moderate dehydration.     Final Clinical Impressions(s) / ED Diagnoses   Final diagnoses:  Non-traumatic rhabdomyolysis  AKI (acute kidney injury) ([place])  Elevated LFTs  Elevated troponin    New Prescriptions New Prescriptions   No medications on file     Varney Biles, MD 03/13/17 0300    Varney Biles, MD 03/13/17 [id number]

## 2017-03-13 NOTE — ED Notes (Signed)
Patient moved to hospital bed , patient more comfortable

## 2017-03-13 NOTE — ED Notes (Signed)
Pt given lunch tray.

## 2017-03-13 NOTE — Progress Notes (Signed)
Pt arrived to 4e from [facility] ED. Pt oriented to room and staff. Vitals obtained. Pt denies needs. Pt resting in bed with call light within reach. Will continue current plan of care.  Grant Fontana BSN, RN

## 2017-03-13 NOTE — Consult Note (Signed)
CARDIOLOGY CONSULT NOTE  Patient ID: Rachael Johnson MRN: [id number] DOB/AGE: 65-Oct-1952 65 y.o.  Admit date: 03/12/2017 Primary Physician Mosie Lukes, MD Primary Cardiologist   Dr. Gwenlyn Found Chief Complaint  Elevated troponin Requesting  Dr. Kathrynn Humble  HPI:  The patient was found down and has acute renal insufficiency and elevated CK.  We are called because of chest pain and an elevated troponin.  There are not acute EKG changes.   She has no cardiac history other than a work up by Dr. Gwenlyn Found in 2016 for evaluation of chest pain.  She had a normal echo and negative Lexiscan Myoview.    The patient is actually quite lucid.  She went to church yesterday morning.  She was doing fine.  Her family could not get her on the phone after that and went over later today and found her in a huge pile of clothes.  She was wedged up against a dresser.  She does not recall anything after coming home from church.  She had otherwise been feeling OK.  She had no recent cardiac complaints.  The patient denies any new symptoms such as chest discomfort, neck or arm discomfort. There has been no new shortness of breath, PND or orthopnea. There have been no reported palpitations, presyncope or syncope.  She does her own activities and actually push mows the lawn.  She did not have loss of bowel or bladder.  She has pressure ulcers on her shoulders and head.  She has bruising on her hands and legs and swollen knees.  There was nothing else disturbed in the house.   Past Medical History:  Diagnosis Date  . Acute pharyngitis 07/13/2014  . Allergy   . Anemia, iron deficiency 07/08/2012  . Arthritis   . Cataract 12/21/2014  . Cerumen impaction 04/28/2013  . Chest pain   . Depression   . Depression with anxiety 03/24/2011  . Diverticulitis   . Esophageal reflux 08/17/2013  . Essential thrombocythemia ([place]) 01/24/2011  . Frequent episodic tension-type headache   . Gastroenteritis 12/21/2014  . 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 GERD (gastroesophageal reflux disease)   . H. pylori infection 12/19/2012  . Hyperglycemia 12/17/2015  . Hyperglycemia 12/17/2015  . Hypertension   . Normal cardiac stress test   . Other and unspecified hyperlipidemia 02/25/2013  . Pedal edema 03/07/2016  . Pneumonia 04/29/2016  . Preventative health care 04/20/2013  . Restless leg syndrome 09/30/2014  . Scabies 03/28/2015  . Seizure ([place])    childhood  . Thyroid disease     Past Surgical History:  Procedure Laterality Date  . ABDOMINAL HYSTERECTOMY     1992  . BREAST SURGERY  1992   biopsy, benign. Fibrocystic  . UMBILICAL HERNIA REPAIR N/A 09/25/2012   Procedure: HERNIA REPAIR UMBILICAL ADULT;  Surgeon: Harl Bowie, MD;  Location: [facility];  Service: General;  Laterality: N/A;    No Known Allergies  Prior to Admission medications   Medication Sig Start Date End Date Taking? Authorizing Provider  albuterol (VENTOLIN HFA) 108 (90 Base) MCG/ACT inhaler INHALE 2 PUFFS INTO THE LUNGS EVERY 6 (SIX) HOURS AS NEEDED FOR WHEEZING OR SHORTNESS OF BREATH.  Can use Ventolin, ProAir or Proventil whichever cheaper 12/21/16   Mosie Lukes, MD  ALPRAZolam Duanne Moron) 1 MG tablet TAKE 1 TABLET BY MOUTH THREE TIMES A DAY AS NEEDED FOR ANXIETY 02/22/17   Carollee Herter, Alferd Apa, DO  amitriptyline (ELAVIL) 10 MG tablet TAKE 2 TABLETS (20 MG  TOTAL) BY MOUTH AT BEDTIME. 03/12/17   Mosie Lukes, MD  aspirin EC 81 MG tablet Take 81 mg by mouth every morning.    [provider]  benzonatate (TESSALON) 200 MG capsule Take 200 mg by mouth as needed. 12/25/16   [provider]  budesonide-formoterol (SYMBICORT) 80-4.5 MCG/ACT inhaler Inhale 2 puffs into the lungs 2 (two) times daily. 12/18/16   Parrett, Fonnie Mu, NP  busPIRone (BUSPAR) 10 MG tablet 10 mg po bid x 7 days then increase to tid 02/12/17   Mosie Lukes, MD  Calcium Carbonate-Vitamin D ([place] PO) Take by mouth.    [provider]  cetirizine (ZYRTEC) 10 MG  tablet Take 1 tablet (10 mg total) by mouth daily. 02/12/17   Mosie Lukes, MD  citalopram (CELEXA) 40 MG tablet Take 1 tablet (40 mg total) by mouth every morning. 12/21/16   Mosie Lukes, MD  Cranberry 300 MG tablet Take 300 mg by mouth 2 (two) times daily.    [provider]  cyclobenzaprine (FLEXERIL) 10 MG tablet Take 1 tablet (10 mg total) by mouth 2 (two) times daily as needed for muscle spasms. 12/21/16   Mosie Lukes, MD  estradiol (ESTRACE) 0.5 MG tablet Take 1 tablet (0.5 mg total) by mouth 2 (two) times daily. 02/12/17   Mosie Lukes, MD  fluorometholone (FML) 0.1 % ophthalmic suspension Place 1 drop into both eyes daily as needed (For dry eyes.).  11/14/11   [provider]  fluticasone (FLONASE) 50 MCG/ACT nasal spray Place 2 sprays into both nostrils daily. 12/21/16   Mosie Lukes, MD  furosemide (LASIX) 20 MG tablet 40 mg in am and 20 mg in pm 02/12/17   Mosie Lukes, MD  HYDROcodone-acetaminophen (NORCO) 5-325 MG tablet Take 1 tablet by mouth every 6 (six) hours as needed for severe pain. Take one tablet by mouth every 6 hours as needed for severe pain 02/12/17   Mosie Lukes, MD  hydroxyurea (HYDREA) 500 MG capsule TAKE 2 CAPSULES BY MOUTH EVERY DAY 02/05/17   [name], MD  lactulose (CHRONULAC) 10 GM/15ML solution TAKE 2 TABLESPOONFULS (30 ML) BY MOUTH 3 TIMES A DAY FOR CONSTIPATION 02/12/17   Mosie Lukes, MD  levothyroxine (SYNTHROID, LEVOTHROID) 88 MCG tablet TAKE 1 TABLET DAILY 02/09/17   Mosie Lukes, MD  meclizine (ANTIVERT) 12.5 MG tablet TAKE 1 TABLET THREE TIMES A DAY AS NEEDED FOR NAUSEA OR DIZZINESS 02/12/17   Mosie Lukes, MD  montelukast (SINGULAIR) 10 MG tablet Take 1 tablet (10 mg total) by mouth at bedtime. 02/12/17   Mosie Lukes, MD  Multiple Vitamin (MULTIVITAMIN PO) Take 1 tablet by mouth every morning.     [provider]  naproxen (NAPROSYN) 500 MG tablet TAKE 1 TABLET (500 MG TOTAL) BY MOUTH 2 (TWO) TIMES  DAILY WITH A MEAL. 02/12/17   Mosie Lukes, MD  ondansetron (ZOFRAN) 8 MG tablet TAKE ONE TABLET BY MOUTH EVERY 8 HOURS AS NEEDED NAUSEA AND VOMITING 02/12/17   Mosie Lukes, MD  pantoprazole (PROTONIX) 40 MG tablet Take 1 tablet (40 mg total) by mouth 2 (two) times daily. 02/12/17   Mosie Lukes, MD  pramipexole (MIRAPEX) 0.25 MG tablet TAKE ONE OR TWO TABLETS BY MOUTH AT BEDTIME AS NEEDED 02/12/17   Mosie Lukes, MD  predniSONE (DELTASONE) 10 MG tablet Take  4 each am x 2 days,   2 each am x  2 days,  1 each am x 2 days and stop 03/06/17   Tanda Rockers, MD  promethazine (PHENERGAN) 25 MG tablet Take 1 tablet (25 mg total) by mouth every 6 (six) hours as needed for nausea or vomiting. 02/04/16   [name], MD  ranitidine (ZANTAC) 300 MG tablet Take 1 tablet (300 mg total) by mouth at bedtime. 02/12/17   Mosie Lukes, MD  RESTASIS 0.05 % ophthalmic emulsion Place 1 drop into both eyes 2 (two) times daily.  11/12/15   [provider]  silver sulfADIAZINE (SILVADENE) 1 % cream Apply 1 application topically 2 (two) times daily. 12/21/16   Mosie Lukes, MD  venlafaxine XR (EFFEXOR-XR) 150 MG 24 hr capsule Take 1 capsule (150 mg total) by mouth daily with breakfast. 12/21/16   Mosie Lukes, MD  venlafaxine XR (EFFEXOR-XR) 75 MG 24 hr capsule TAKE ONE CAPSULE BY MOUTH DAILY WITH BREAKFAST 08/28/16   Mosie Lukes, MD  Vitamin D, Ergocalciferol, (DRISDOL) 50000 units CAPS capsule Take 1 capsule (50,000 Units total) by mouth every 7 (seven) days. 02/12/17   Mosie Lukes, MD    Social History   Social History  . Marital status: Widowed    Spouse name: N/A  . Number of children: 2  . Years of education: N/A   Occupational History  . [employer] operator [employer]   Social History Main Topics  . Smoking status: Former Smoker    Packs/day: 3.50    Years: 20.00    Types: Cigarettes    Start date: 11/15/1970    Quit date: 07/31/1990  . Smokeless tobacco: Never Used      Comment: quit 23 years ago  . Alcohol use No  . Drug use: No  . Sexual activity: No   Other Topics Concern  . Not on file   Social History Narrative   Regular exercise: no   Caffeine Use: 2-3 weekly    Family History  Problem Relation Age of Onset  . Arthritis Mother   . Cancer Mother        ovarian  . Hypertension Mother   . Heart disease Mother        pacer  . Heart failure Mother   . Arthritis Father   . Cancer Father        lung  . Hypertension Father   . Cancer Sister        lung  . Cancer Brother        prostate  . Cancer Brother        lung  . Hypertension Son   . COPD Brother   . Heart disease Brother   . Hypertension Brother   . Cancer Brother        colon  . Alcohol abuse Brother   . Cirrhosis Brother   . Seizures Sister   . Stroke Sister   . Arthritis Brother     ROS:    As stated in the HPI and negative for all other systems.  Physical Exam: Blood pressure 109/70, pulse 75, temperature 97.9 F (36.6 C), temperature source Oral, resp. rate (!) 21, height 5\' 3"  (1.6 m), weight 143 lb (64.9 kg), last menstrual period 07/31/1990, SpO2 97 %.  GENERAL:  Ill appearing  HEENT:  Pupils equal round and reactive, fundi not visualized, oral mucosa dry NECK:  Unable to assess as the patient is in a cervical collar LYMPHATICS:  No inguinal adenopathy LUNGS:  Clear to auscultation bilaterally BACK:  No CVA tenderness CHEST:  Unremarkable HEART:  PMI not displaced or sustained,S1 and S2 within normal limits, no S3, no S4, no clicks, no rubs, no murmurs ABD:  Flat, positive bowel sounds normal in frequency in pitch, no bruits, no rebound, no guarding, no midline pulsatile mass, no hepatomegaly, no splenomegaly EXT:  2 plus pulses throughout, no edema, no cyanosis no clubbing SKIN:  Multiple bruises.  Pressure ulcers  NEURO:  Cranial nerves II through XII grossly intact, motor grossly intact throughout PSYCH:  Cognitively intact, oriented to person and  place   Labs: Lab Results  Component Value Date   BUN 69 (H) 03/12/2017   Lab Results  Component Value Date   CREATININE 2.70 (H) 03/12/2017   Lab Results  Component Value Date   NA 147 (H) 03/12/2017   K 3.8 03/12/2017   CL 116 (H) 03/12/2017   CO2 16 (L) 03/12/2017   Lab Results  Component Value Date   TROPONINI 21.50 ([place]) 03/12/2017   Lab Results  Component Value Date   WBC 11.6 (H) 03/12/2017   HGB 13.6 03/12/2017   HCT 40.0 03/12/2017   MCV 122.7 (H) 03/12/2017   PLT 588 (H) 03/12/2017   Lab Results  Component Value Date   CHOL 161 02/12/2017   HDL 66.40 02/12/2017   LDLCALC 82 02/12/2017   TRIG 60.0 02/12/2017   CHOLHDL 2 02/12/2017   Lab Results  Component Value Date   ALT 201 (H) 03/12/2017   AST 876 (H) 03/12/2017   ALKPHOS 62 03/12/2017   BILITOT 1.4 (H) 03/12/2017      Radiology:   CXR: The lungs are well-aerated and clear. There is no evidence of focal opacification, pleural effusion or pneumothorax.  The heart is normal in size; the mediastinal contour is within normal limits. No acute osseous abnormalities are seen.  EKG:  NSR, nonspecific ST T wave changes.  No acute ST elevation.  Prolonged QT.   03/13/2017  ASSESSMENT AND PLAN:   ELEVATED TROPONIN:  I suspect that this is a secondary process to the AKI and rhabdo.  She has had no recent cardiac complaints.  It is possible that she had a primary cardiac event with LOC.  She does have an prolonged QT.  But there is no confirming evidence for syncope let alone cardiac syncope.  She does not have acute ischemic EKG changes.  I will order an echo.  I think it is prudent to start heparin.  She will need a cardiac evaluation with probable cath once her renal function has improved.     AKI;  Creat is up from basline.  Rhabdo.  Etiology of the acute event that led to her being found down is not clear.  ELEVATED LIVER ENZYMES:  Per the primary team.    Signed: Minus Breeding 03/13/2017, 12:55  AM

## 2017-03-14 ENCOUNTER — Inpatient Hospital Stay (HOSPITAL_COMMUNITY): Payer: Medicare Other

## 2017-03-14 ENCOUNTER — Other Ambulatory Visit: Payer: Self-pay

## 2017-03-14 DIAGNOSIS — G2581 Restless legs syndrome: Secondary | ICD-10-CM

## 2017-03-14 DIAGNOSIS — T796XXD Traumatic ischemia of muscle, subsequent encounter: Secondary | ICD-10-CM | POA: Diagnosis not present

## 2017-03-14 DIAGNOSIS — S41001S Unspecified open wound of right shoulder, sequela: Secondary | ICD-10-CM

## 2017-03-14 DIAGNOSIS — K1379 Other lesions of oral mucosa: Secondary | ICD-10-CM

## 2017-03-14 DIAGNOSIS — R748 Abnormal levels of other serum enzymes: Secondary | ICD-10-CM | POA: Diagnosis not present

## 2017-03-14 DIAGNOSIS — R52 Pain, unspecified: Secondary | ICD-10-CM

## 2017-03-14 DIAGNOSIS — D473 Essential (hemorrhagic) thrombocythemia: Secondary | ICD-10-CM

## 2017-03-14 DIAGNOSIS — E039 Hypothyroidism, unspecified: Secondary | ICD-10-CM

## 2017-03-14 DIAGNOSIS — I10 Essential (primary) hypertension: Secondary | ICD-10-CM

## 2017-03-14 DIAGNOSIS — D539 Nutritional anemia, unspecified: Secondary | ICD-10-CM

## 2017-03-14 DIAGNOSIS — M25511 Pain in right shoulder: Secondary | ICD-10-CM

## 2017-03-14 DIAGNOSIS — S0100XA Unspecified open wound of scalp, initial encounter: Secondary | ICD-10-CM

## 2017-03-14 DIAGNOSIS — S21202A Unspecified open wound of left back wall of thorax without penetration into thoracic cavity, initial encounter: Secondary | ICD-10-CM

## 2017-03-14 HISTORY — DX: Unspecified open wound of right shoulder, sequela: S41.001S

## 2017-03-14 LAB — COMPREHENSIVE METABOLIC PANEL
ALT: 172 U/L — ABNORMAL HIGH (ref 14–54)
AST: 378 U/L — ABNORMAL HIGH (ref 15–41)
Albumin: 2.9 g/dL — ABNORMAL LOW (ref 3.5–5.0)
Alkaline Phosphatase: 48 U/L (ref 38–126)
Anion gap: 12 (ref 5–15)
BUN: 77 mg/dL — ABNORMAL HIGH (ref 6–20)
CO2: 21 mmol/L — ABNORMAL LOW (ref 22–32)
Calcium: 7.6 mg/dL — ABNORMAL LOW (ref 8.9–10.3)
Chloride: 107 mmol/L (ref 101–111)
Creatinine, Ser: 3.94 mg/dL — ABNORMAL HIGH (ref 0.44–1.00)
GFR calc Af Amer: 13 mL/min — ABNORMAL LOW (ref >60)
GFR calc non Af Amer: 11 mL/min — ABNORMAL LOW (ref >60)
Glucose, Bld: 131 mg/dL — ABNORMAL HIGH (ref 65–99)
Potassium: 3.5 mmol/L (ref 3.5–5.1)
Sodium: 140 mmol/L (ref 135–145)
Total Bilirubin: 0.5 mg/dL (ref 0.3–1.2)
Total Protein: 5.6 g/dL — ABNORMAL LOW (ref 6.5–8.1)

## 2017-03-14 LAB — HIV ANTIBODY (ROUTINE TESTING W REFLEX): HIV Screen 4th Generation wRfx: NONREACTIVE

## 2017-03-14 LAB — FOLATE RBC
Folate, Hemolysate: 491.7 ng/mL
Folate, RBC: 1401 ng/mL (ref >498)
Hematocrit: 35.1 % (ref 34.0–46.6)

## 2017-03-14 LAB — CBC
HCT: 30.9 % — ABNORMAL LOW (ref 36.0–46.0)
Hemoglobin: 10 g/dL — ABNORMAL LOW (ref 12.0–15.0)
MCH: 40.2 pg — ABNORMAL HIGH (ref 26.0–34.0)
MCHC: 32.4 g/dL (ref 30.0–36.0)
MCV: 124.1 fL — ABNORMAL HIGH (ref 78.0–100.0)
Platelets: 440 10*3/uL — ABNORMAL HIGH (ref 150–400)
RBC: 2.49 MIL/uL — ABNORMAL LOW (ref 3.87–5.11)
RDW: 14.5 % (ref 11.5–15.5)
WBC: 8.9 10*3/uL (ref 4.0–10.5)

## 2017-03-14 LAB — CK TOTAL AND CKMB (NOT AT ARMC)
CK, MB: 152.2 ng/mL — ABNORMAL HIGH (ref 0.5–5.0)
Relative Index: 2 (ref 0.0–2.5)
Total CK: 7493 U/L — ABNORMAL HIGH (ref 38–234)

## 2017-03-14 LAB — MAGNESIUM: Magnesium: 2.1 mg/dL (ref 1.7–2.4)

## 2017-03-14 LAB — ECHOCARDIOGRAM COMPLETE
Height: 63 in
Weight: 2324.8 oz

## 2017-03-14 LAB — HEPARIN LEVEL (UNFRACTIONATED): Heparin Unfractionated: 0.44 IU/mL (ref 0.30–0.70)

## 2017-03-14 LAB — RPR: RPR Ser Ql: NONREACTIVE

## 2017-03-14 LAB — PHOSPHORUS: Phosphorus: 4.5 mg/dL (ref 2.5–4.6)

## 2017-03-14 MED ORDER — PHENOL 1.4 % MT LIQD
1.0000 | OROMUCOSAL | Status: DC | PRN
  Administered 2017-03-14: 1 via OROMUCOSAL
  Filled 2017-03-14: qty 177

## 2017-03-14 MED ORDER — COLLAGENASE 250 UNIT/GM EX OINT
TOPICAL_OINTMENT | Freq: Every day | CUTANEOUS | Status: DC
  Administered 2017-03-15 – 2017-03-23 (×8): via TOPICAL
  Filled 2017-03-14 (×2): qty 30

## 2017-03-14 MED ORDER — BACITRACIN ZINC 500 UNIT/GM EX OINT
TOPICAL_OINTMENT | Freq: Two times a day (BID) | CUTANEOUS | Status: DC
  Administered 2017-03-15: 1 via TOPICAL
  Administered 2017-03-15: 23:00:00 via TOPICAL
  Administered 2017-03-15: 1 via TOPICAL
  Administered 2017-03-16 – 2017-03-22 (×10): via TOPICAL
  Administered 2017-03-22 (×2): 1 via TOPICAL
  Administered 2017-03-23: 10:00:00 via TOPICAL
  Filled 2017-03-14 (×2): qty 28.35

## 2017-03-14 MED ORDER — CITALOPRAM HYDROBROMIDE 20 MG PO TABS
20.0000 mg | ORAL_TABLET | Freq: Every morning | ORAL | Status: DC
  Administered 2017-03-15 – 2017-03-23 (×9): 20 mg via ORAL
  Filled 2017-03-14 (×9): qty 1
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
## 2017-03-14 NOTE — Progress Notes (Signed)
Pt has not voided this shift. Pt denies urge to urinate. Pt abdomen not distended, no signs of fluid overload. No SOB or crackles. Bladder scan shows 64mLs for highest value.. Order obtained for an in-out cath. In-out done x3, unable to obtain any urine. Peri care preformed. Pt repositioned in bed, call bell within reach. Will continue to monitor and pass in report.

## 2017-03-14 NOTE — Progress Notes (Signed)
  Echocardiogram 2D Echocardiogram has been performed.  Rachael Johnson 03/14/2017, 3:27 PM

## 2017-03-14 NOTE — Consult Note (Signed)
Rachael Johnson Admit Date: 03/12/2017 03/14/2017 Rachael Johnson Requesting Physician:  Rachael Johnson  Reason for Consult:  AKI HPI:  66 year old female admitted 2 days ago after 1 day of a fall and prolonged immobility after becoming unconscious when a ceramic angel fell on her head.  PMH Incudes:  Normal GFR as of July of this year  Osteoarthritis on chronic naproxen  Hypertension on furosemide  GERD  Hypothyroidism  Depression/anxiety  Past smoker  Evaluation in the emergency room demonstrated a serum creatinine kinase of 32,000 and an elevated troponin of 21.5.  Creatinine was elevated already nearly to 3. Urinalysis demonstrated hemoglobinuria without hematuria. Potassium levels have been fairly normal. Serum bicarbonate is 21. Phosphorus is 4.5.  Cardiology is following for the elevated troponin, likely not ischemic in etiology.  CT of the abdomen, without contrast, an emergency evaluation demonstrated normal-appearing kidneys without evidence of obstruction or structural issues.  Patient complains of being sore all over. She does confirm that she was taking twice daily naproxen 500 mg up to the day of this incident.  No fevers,, no dyspnea. Blood pressure is stable without medication.  Urine output was no day, a Foley catheter has subsequently been placed. About a 50 mls has come since then   Creatinine (mg/dL)  Date Value  11/29/2016 0.8  10/17/2016 0.8  07/18/2016 0.7  02/04/2016 0.7  12/03/2015 0.7  09/03/2015 0.7  06/02/2015 0.7   Creat  Date Value  01/11/2017 0.9 mg/dl  05/15/2016 1.1 mg/dl  09/10/2014 0.6 mg/dl  06/12/2014 0.6 mg/dl  12/08/2013 0.7 mg/dl  11/24/2013 0.56 mg/dL  04/18/2013 0.64 mg/dL  02/21/2013 0.62 mg/dL  08/14/2012 0.67 mg/dL  02/09/2012 0.59 mg/dL   Creatinine, Ser (mg/dL)  Date Value  03/14/2017 3.94 (H)  03/13/2017 2.59 (H)  03/12/2017 2.70 (H)  03/12/2017 2.97 (H)  02/12/2017 0.72  06/19/2016 0.70  12/17/2015 0.68   09/16/2015 0.66  07/18/2015 4.30 (H)  07/18/2015 0.67  ] I/Os: I/O last 3 completed shifts: In: 3189.1 [P.O.:480; I.V.:2709.1] Out: 600 [Urine:600]  ROS NSAIDS: daily naproxen bid IV Contrast no TMP/SMX no Hypotension no Balance of 12 systems is negative w/ exceptions as above  PMH  Past Medical History:  Diagnosis Date  . Anemia, iron deficiency 07/08/2012  . Arthritis   . Cataract 12/21/2014  . Depression with anxiety 03/24/2011  . Diverticulitis   . Esophageal reflux 08/17/2013  . Essential thrombocythemia ([place]) 01/24/2011  . Frequent episodic tension-type headache   . Gastroenteritis 12/21/2014  . 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 GERD (gastroesophageal reflux disease)   . H. pylori infection 12/19/2012  . Hyperglycemia 12/17/2015  . Hypertension   . Other and unspecified hyperlipidemia 02/25/2013  . Restless leg syndrome 09/30/2014  . Rhabdomyolysis 02/2017  . Scabies 03/28/2015  . Seizure ([place])    childhood  . Thyroid disease    PSH  Past Surgical History:  Procedure Laterality Date  . ABDOMINAL HYSTERECTOMY     1992  . BREAST SURGERY  1992   biopsy, benign. Fibrocystic  . UMBILICAL HERNIA REPAIR N/A 09/25/2012   Procedure: HERNIA REPAIR UMBILICAL ADULT;  Surgeon: Harl Bowie, MD;  Location: [facility];  Service: General;  Laterality: N/A;   FH  Family History  Problem Relation Age of Onset  . Arthritis Mother   . Cancer Mother        ovarian  . Hypertension Mother   . Heart disease Mother        pacer  . Heart failure Mother   .  Arthritis Father   . Cancer Father        lung  . Hypertension Father   . Cancer Sister        lung  . Cancer Brother        prostate  . Cancer Brother        lung  . Hypertension Son   . COPD Brother   . Heart disease Brother        died from CHF  . Hypertension Brother   . Cancer Brother        colon  . Alcohol abuse Brother   . Cirrhosis Brother   . Seizures Sister   . Stroke Sister   . Arthritis Brother    Rachael Johnson   reports that she quit smoking about 26 years ago. Her smoking use included Cigarettes. She started smoking about 46 years ago. She has a 70.00 pack-year smoking history. She has never used smokeless tobacco. She reports that she does not drink alcohol or use drugs. Allergies No Known Allergies Home medications Prior to Admission medications   Medication Sig Start Date End Date Taking? Authorizing Provider  albuterol (VENTOLIN HFA) 108 (90 Base) MCG/ACT inhaler INHALE 2 PUFFS INTO THE LUNGS EVERY 6 (SIX) HOURS AS NEEDED FOR WHEEZING OR SHORTNESS OF BREATH.  Can use Ventolin, ProAir or Proventil whichever cheaper 12/21/16  Yes Mosie Lukes, MD  ALPRAZolam Duanne Moron) 1 MG tablet TAKE 1 TABLET BY MOUTH THREE TIMES A DAY AS NEEDED FOR ANXIETY 02/22/17  Yes Carollee Herter, Kendrick Fries R, DO  amitriptyline (ELAVIL) 10 MG tablet TAKE 2 TABLETS (20 MG TOTAL) BY MOUTH AT BEDTIME. 03/12/17  Yes Mosie Lukes, MD  aspirin EC 81 MG tablet Take 81 mg by mouth every morning.   Yes [provider]  benzonatate (TESSALON) 200 MG capsule Take 200 mg by mouth 3 (three) times daily as needed for cough.  12/25/16  Yes [provider]  budesonide-formoterol (SYMBICORT) 80-4.5 MCG/ACT inhaler Inhale 2 puffs into the lungs 2 (two) times daily. 12/18/16  Yes Parrett, Tammy S, NP  busPIRone (BUSPAR) 10 MG tablet 10 mg po bid x 7 days then increase to tid Patient taking differently: Take 10 mg by mouth 3 (three) times daily.  02/12/17  Yes Mosie Lukes, MD  cetirizine (ZYRTEC) 10 MG tablet Take 1 tablet (10 mg total) by mouth daily. 02/12/17  Yes Mosie Lukes, MD  chlorpheniramine (CHLOR-TRIMETON) 4 MG tablet Take 8 mg by mouth at bedtime.   Yes [provider]  citalopram (CELEXA) 40 MG tablet Take 1 tablet (40 mg total) by mouth every morning. 12/21/16  Yes Mosie Lukes, MD  Cranberry 300 MG tablet Take 300 mg by mouth 2 (two) times daily.   Yes [provider]  cyclobenzaprine (FLEXERIL) 10  MG tablet Take 1 tablet (10 mg total) by mouth 2 (two) times daily as needed for muscle spasms. 12/21/16  Yes Mosie Lukes, MD  estradiol (ESTRACE) 0.5 MG tablet Take 1 tablet (0.5 mg total) by mouth 2 (two) times daily. 02/12/17  Yes Mosie Lukes, MD  fluticasone (FLONASE) 50 MCG/ACT nasal spray Place 2 sprays into both nostrils daily. 12/21/16  Yes Mosie Lukes, MD  folic acid (FOLVITE) 662 MCG tablet Take 800 mcg by mouth daily.   Yes [provider]  furosemide (LASIX) 20 MG tablet 40 mg in am and 20 mg in pm Patient taking differently: Take 40 mg by mouth daily.  02/12/17  Yes Mosie Lukes, MD  HYDROcodone-acetaminophen (NORCO) 5-325 MG tablet Take 1 tablet by mouth every 6 (six) hours as needed for severe pain. Take one tablet by mouth every 6 hours as needed for severe pain 02/12/17  Yes Mosie Lukes, MD  hydroxyurea (HYDREA) 500 MG capsule TAKE 2 CAPSULES BY MOUTH EVERY DAY 02/05/17  Yes Ennever, Rudell Cobb, MD  lactulose ([place]) 10 GM/15ML solution TAKE 2 TABLESPOONFULS (30 ML) BY MOUTH 3 TIMES A DAY FOR CONSTIPATION 02/12/17  Yes Mosie Lukes, MD  levothyroxine (SYNTHROID, LEVOTHROID) 88 MCG tablet TAKE 1 TABLET DAILY 02/09/17  Yes Mosie Lukes, MD  meclizine (ANTIVERT) 12.5 MG tablet TAKE 1 TABLET THREE TIMES A DAY AS NEEDED FOR NAUSEA OR DIZZINESS 02/12/17  Yes Mosie Lukes, MD  montelukast (SINGULAIR) 10 MG tablet Take 1 tablet (10 mg total) by mouth at bedtime. 02/12/17  Yes Mosie Lukes, MD  Multiple Vitamin (MULTIVITAMIN PO) Take 1 tablet by mouth every morning.    Yes [provider]  naproxen (NAPROSYN) 500 MG tablet TAKE 1 TABLET (500 MG TOTAL) BY MOUTH 2 (TWO) TIMES DAILY WITH A MEAL. 02/12/17  Yes Mosie Lukes, MD  omeprazole (PRILOSEC) 40 MG capsule Take 40 mg by mouth 2 (two) times daily.   Yes [provider]  pantoprazole (PROTONIX) 40 MG tablet Take 1 tablet (40 mg total) by mouth 2 (two) times daily. 02/12/17  Yes Mosie Lukes,  MD  pramipexole (MIRAPEX) 0.25 MG tablet TAKE ONE OR TWO TABLETS BY MOUTH AT BEDTIME AS NEEDED 02/12/17  Yes Mosie Lukes, MD  promethazine (PHENERGAN) 25 MG tablet Take 1 tablet (25 mg total) by mouth every 6 (six) hours as needed for nausea or vomiting. 02/04/16  Yes [name], MD  ranitidine (ZANTAC) 300 MG tablet Take 1 tablet (300 mg total) by mouth at bedtime. 02/12/17  Yes Mosie Lukes, MD  RESTASIS 0.05 % ophthalmic emulsion Place 1 drop into both eyes 2 (two) times daily.  11/12/15  Yes [provider]  venlafaxine XR (EFFEXOR-XR) 75 MG 24 hr capsule TAKE ONE CAPSULE BY MOUTH DAILY WITH BREAKFAST 08/28/16  Yes Mosie Lukes, MD  Vitamin D, Ergocalciferol, (DRISDOL) 50000 units CAPS capsule Take 1 capsule (50,000 Units total) by mouth every 7 (seven) days. 02/12/17  Yes Mosie Lukes, MD    Current Medications Scheduled Meds: . amitriptyline  20 mg Oral QHS  . aspirin EC  81 mg Oral Daily  . bacitracin   Topical BID  . busPIRone  10 mg Oral TID  . [START ON 03/15/2017] citalopram  20 mg Oral q morning - 10a  . collagenase   Topical Daily  . cycloSPORINE  1 drop Both Eyes BID  . famotidine  20 mg Oral QHS  . fluticasone  2 spray Each Nare Daily  . levothyroxine  88 mcg Oral QAC breakfast  . loratadine  10 mg Oral Daily  . mometasone-formoterol  2 puff Inhalation BID  . montelukast  10 mg Oral QHS  . multivitamin   Oral q morning - 10a  . pantoprazole  40 mg Oral BID  . pramipexole  0.25-0.5 mg Oral QHS  . venlafaxine XR  75 mg Oral Q breakfast   Continuous Infusions: . heparin 1,000 Units/hr (03/14/17 0246)   PRN Meds:.acetaminophen, ALPRAZolam, cyclobenzaprine, fluorometholone, HYDROcodone-acetaminophen, lactulose, lip balm, meclizine, nitroGLYCERIN, ondansetron (ZOFRAN) IV, phenol  CBC  Recent Labs Lab 03/12/17 2320 03/12/17 2352 03/13/17 0338 03/13/17 2046  03/14/17 0224  WBC 11.6*  --   --  10.4 8.9  HGB 12.9 13.6  --  11.0* 10.0*  HCT 37.8  40.0 35.1 33.3* 30.9*  MCV 122.7*  --   --  123.8* 124.1*  PLT 588*  --   --  554* 360*   Basic Metabolic Panel  Recent Labs Lab 03/12/17 2320 03/12/17 2352 03/13/17 0720 03/14/17 0840  NA 146* 147* 141 140  K 3.7 3.8 3.5 3.5  CL 112* 116* 109 107  CO2 16*  --  21* 21*  GLUCOSE 117* 110* 153* 131*  BUN 73* 69* 70* 77*  CREATININE 2.97* 2.70* 2.59* 3.94*  CALCIUM 8.7*  --  7.9* 7.6*  PHOS  --   --   --  4.5    Physical Exam  Blood pressure (!) 100/58, pulse 82, temperature 98.2 F (36.8 C), temperature source Oral, resp. rate 18, height 5\' 3"  (1.6 m), weight 65.9 kg (145 lb 4.8 oz), last menstrual period 07/31/1990, SpO2 94 %. GEN: Very still, no acute distress, ENT: NCAT EYES: EOMI CV: RRR, no rub, no murmur, normal S1 and S2 PULM: CTA B, normal work of breathing ABD: Soft, nontender SKIN: No rashes or lesions EXT: Trace to 1+ edema in the legs NEURO: Nonfocal   Assessment 66 year old female with anuric AKI day secondary to rhabdomyolysis after prolonged immobility from head trauma  1. Anuric AKI, baseline normal GFR, etiology rhabdomyolysis 2/2 immobility +/- NSAIDs, stable electrolytes, not volume overloaded 2. Rhabdomyolysis 2/2 prolonged immobility 3. Chronic NSAID use 4. Positive Troponin, Cardiology following   Plan 1. No emergent need for dialysis but her and urea is very worrisome, continue Foley catheter, if no significant increase in urine output overnight we'll need to reduce IV fluids to minimize risk of hypervolemia; it is unlikely that we are augmenting urine output at this point and the CK levels are falling 2. Daily weights, Daily Renal Panel, Strict I/Os, Avoid nephrotoxins (NSAIDs, judicious IV Contrast)  3. We'll follow along closely   Pearson Grippe MD [phone] [phone] 03/14/2017, 8:30 PM

## 2017-03-14 NOTE — Care Management Note (Signed)
Case Management Note Marvetta Gibbons RN, BSN Unit 4E-Case Manager [phone]  Patient Details  Name: ALEYNA CUEVA MRN: [id number] Date of Birth: 13-Sep-1950  Subjective/Objective:  Pt admitted s/p fall at home with rhabdo                   Action/Plan: PTA pt lived at home alone-  Has supportive family nearby- referral received for Symbicort needs- spoke with pt at bedside- per conversation pt states that she has Medicare coverage for medications- however her Symbicort is not covered under her plan- and her copay is $143- she states she can not afford this- there is no coupon that she can use as it is only for commercially insured. She is followed by Dr. Melvyn Novas and reports that she has spoken with him but he wants her on this drug- have encouraged pt to have another conversation with DR. Wert regarding that fact that she can not afford this drug- also to see what drug is on her plan that is covered and see if he would be ok with an alternate drug that is covered that she can afford. Pt would not be eligible for pt assistance. -  Pt reports that she is independent - still drives and takes care of herself. She has supportive family that assist as needed. CM to follow for any further needs.   Expected Discharge Date:                  Expected Discharge Plan:  Home/Self Care  In-House Referral:     Discharge planning Services  CM Consult, Medication Assistance  Post Acute Care Choice:    Choice offered to:     DME Arranged:    DME Agency:     HH Arranged:    HH Agency:     Status of Service:  In process, will continue to follow  If discussed at Long Length of Stay Meetings, dates discussed:    Discharge Disposition:   Additional Comments:  Dawayne Patricia, RN 03/14/2017, 2:48 PM

## 2017-03-14 NOTE — Progress Notes (Signed)
ANTICOAGULATION CONSULT NOTE - Follow Up Consult  Pharmacy Consult for Heparin Indication: chest pain/ACS  No Known Allergies  Patient Measurements: Height: 5\' 3"  (160 cm) Weight: 145 lb 4.8 oz (65.9 kg) IBW/kg (Calculated) : 52.4 Heparin Dosing Weight: 66 kg  Vital Signs: BP: 94/61 (08/15 0743) Pulse Rate: 58 (08/15 0743)  Labs:  Recent Labs  03/12/17 2320 03/12/17 2352 03/13/17 0016  03/13/17 0338 03/13/17 0720 03/13/17 1047 03/13/17 1347 03/13/17 2046 03/14/17 0224 03/14/17 0840  HGB 12.9 13.6  --   --   --   --   --   --  11.0* 10.0*  --   HCT 37.8 40.0  --   --   --   --   --   --  33.3* 30.9*  --   PLT 588*  --   --   --   --   --   --   --  554* 440*  --   APTT  --   --  29  --   --   --   --   --   --   --   --   LABPROT  --   --  15.6*  --   --   --   --   --   --   --   --   INR  --   --  1.23  --   --   --   --   --   --   --   --   HEPARINUNFRC  --   --   --   --   --   --  0.17*  --  0.35 0.44  --   CREATININE 2.97* 2.70*  --   --   --  2.59*  --   --   --   --  3.94*  CKTOTAL 32,176*  --   --   --   --   --   --   --   --   --  7,493*  CKMB  --   --   --   --   --   --   --   --   --   --  152.2*  TROPONINI 21.50*  --   --   < > 16.09* 14.51*  --  10.38*  --   --   --   < > = values in this interval not displayed.  Estimated Creatinine Clearance: 13 mL/min (A) (by C-G formula based on SCr of 3.94 mg/dL (H)).  Assessment:  66 yo female admitted 8/13 pm with found down/rhabdo and AKI. Patient with elevated troponins and chest pain; pharmacy consulted to dose heparin. No PTA oral anticoagulation per med rec.     Heparin level remains therapeutic (0.44) on 1000 units/hr.   Goal of Therapy:  Heparin level 0.3-0.7 units/ml Monitor platelets by anticoagulation protocol: Yes   Plan:   Continue heparin drip at 1000 units/hr.  Daily heparin level and CBC while on heparin.   Consider decreasing or stopping Celexa with prolonged QT?  Arty Baumgartner, [place] Pager: [phone] 03/14/2017,11:16 AM

## 2017-03-14 NOTE — Progress Notes (Signed)
  Echocardiogram 2D Echocardiogram has been performed.  Rachael Johnson 03/14/2017, 3:12 PM

## 2017-03-14 NOTE — Progress Notes (Signed)
Progress Note  Patient Name: Rachael Johnson Date of Encounter: 03/14/2017  Primary Cardiologist: Gwenlyn Found  Subjective   Sore all over, no chest pain.   Inpatient Medications    Scheduled Meds: . amitriptyline  20 mg Oral QHS  . aspirin EC  81 mg Oral Daily  . busPIRone  10 mg Oral TID  . citalopram  40 mg Oral q morning - 10a  . collagenase   Topical Daily  . cycloSPORINE  1 drop Both Eyes BID  . famotidine  20 mg Oral QHS  . fluticasone  2 spray Each Nare Daily  . levothyroxine  88 mcg Oral QAC breakfast  . loratadine  10 mg Oral Daily  . mometasone-formoterol  2 puff Inhalation BID  . montelukast  10 mg Oral QHS  . multivitamin   Oral q morning - 10a  . pantoprazole  40 mg Oral BID  . pramipexole  0.25-0.5 mg Oral QHS  . venlafaxine XR  75 mg Oral Q breakfast   Continuous Infusions: . sodium chloride 110 mL/hr at 03/14/17 0940  . heparin 1,000 Units/hr (03/14/17 0246)   PRN Meds: acetaminophen, ALPRAZolam, cyclobenzaprine, fluorometholone, HYDROcodone-acetaminophen, lactulose, lip balm, meclizine, nitroGLYCERIN, ondansetron (ZOFRAN) IV, phenol   Vital Signs    Vitals:   03/14/17 0400 03/14/17 0432 03/14/17 0743 03/14/17 0757  BP: (!) 88/63 90/66 94/61    Pulse: 65  (!) 58   Resp: 12  19   Temp:      TempSrc:      SpO2: 98%  96% 99%  Weight:  145 lb 4.8 oz (65.9 kg)    Height:        Intake/Output Summary (Last 24 hours) at 03/14/17 1141 Last data filed at 03/14/17 0900  Gross per 24 hour  Intake           2949.1 ml  Output              600 ml  Net           2349.1 ml   Filed Weights   03/12/17 2321 03/14/17 0432  Weight: 143 lb (64.9 kg) 145 lb 4.8 oz (65.9 kg)    Telemetry    SR - Personally Reviewed  ECG    SR - Personally Reviewed  Physical Exam   General: Well developed, well nourished, female appearing in no acute distress. Pale Head: Normocephalic, atraumatic.  Neck: Supple without bruits, JVD. Lungs:  Resp regular and unlabored,  CTA. Heart: RRR, S1, S2, no S3, S4, or murmur; no rub. Abdomen: Soft, non-tender, non-distended with normoactive bowel sounds. No hepatomegaly. No rebound/guarding. No obvious abdominal masses. Extremities: No clubbing, cyanosis, edema. Distal pedal pulses are 2+ bilaterally. Wounds to shoulders Neuro: Alert and oriented X 3. Moves all extremities spontaneously. Psych: Normal affect.  Labs    Chemistry Recent Labs Lab 03/12/17 2320 03/12/17 2352 03/13/17 0720 03/14/17 0840  NA 146* 147* 141 140  K 3.7 3.8 3.5 3.5  CL 112* 116* 109 107  CO2 16*  --  21* 21*  GLUCOSE 117* 110* 153* 131*  BUN 73* 69* 70* 77*  CREATININE 2.97* 2.70* 2.59* 3.94*  CALCIUM 8.7*  --  7.9* 7.6*  PROT 7.5  --   --  5.6*  ALBUMIN 4.0  --   --  2.9*  AST 876*  --   --  378*  ALT 201*  --   --  172*  ALKPHOS 62  --   --  48  BILITOT 1.4*  --   --  0.5  GFRNONAA 16*  --  18* 11*  GFRAA 18*  --  21* 13*  ANIONGAP 18*  --  11 12     Hematology Recent Labs Lab 03/12/17 2320 03/12/17 2352 03/13/17 2046 03/14/17 0224  WBC 11.6*  --  10.4 8.9  RBC 3.08*  --  2.69* 2.49*  HGB 12.9 13.6 11.0* 10.0*  HCT 37.8 40.0 33.3* 30.9*  MCV 122.7*  --  123.8* 124.1*  MCH 41.9*  --  40.9* 40.2*  MCHC 34.1  --  33.0 32.4  RDW 14.2  --  14.6 14.5  PLT 588*  --  554* 440*    Cardiac Enzymes Recent Labs Lab 03/13/17 0231 03/13/17 0338 03/13/17 0720 03/13/17 1347  TROPONINI 16.65* 16.09* 14.51* 10.38*   No results for input(s): TROPIPOC in the last 168 hours.   BNPNo results for input(s): BNP, PROBNP in the last 168 hours.   DDimer No results for input(s): DDIMER in the last 168 hours.    Radiology    Ct Abdomen Pelvis Wo Contrast  Result Date: 03/13/2017 CLINICAL DATA:  Back and shoulder pain EXAM: CT ABDOMEN AND PELVIS WITHOUT CONTRAST TECHNIQUE: Multidetector CT imaging of the abdomen and pelvis was performed following the standard protocol without IV contrast. COMPARISON:  03/12/2017, 07/18/2015  FINDINGS: Lower chest: No consolidation or pleural effusion is seen. Mild bronchiectasis and scarring in the right middle lobe and lingula. Mild right lower lobe subpleural interstitial density and slight nodularity. Heart size within normal limits. Hepatobiliary: Liver partially obscured by artifact. No calcified gallstones or biliary dilatation Pancreas: Unremarkable. No pancreatic ductal dilatation or surrounding inflammatory changes. Spleen: Normal in size without focal abnormality. Adrenals/Urinary Tract: Adrenal glands are within normal limits. Punctate nonobstructing stone in the mid right kidney. Punctate nonobstructing stone in the lower pole left kidney on coronal views. 1.2 cm cyst mid right kidney. Negative for hydronephrosis or ureteral stone. Bladder within normal limits. Stomach/Bowel: Stomach is within normal limits. Appendix appears normal. No evidence of bowel wall thickening, distention, or inflammatory changes. Scattered sigmoid colon diverticula without acute inflammation Vascular/Lymphatic: No significant vascular findings are present. No enlarged abdominal or pelvic lymph nodes. Reproductive: Status post hysterectomy. No adnexal masses. Other: Negative for free air or free fluid Musculoskeletal: No acute or suspicious bone lesion IMPRESSION: 1. Punctate nonobstructing stones in the kidneys. Negative for hydronephrosis or ureteral stone. 2. Scattered sigmoid colon diverticula without acute inflammation 3. Mild right lower lobe subpleural interstitial density and nodularity, could reflect small airways infection or possible atypical pneumonia. Electronically Signed   By: Donavan Foil M.D.   On: 03/13/2017 01:33   Dg Chest 2 View  Result Date: 03/13/2017 CLINICAL DATA:  Found down for unknown amount of time, with bruising and sores about the chest and back. Initial encounter. EXAM: CHEST  2 VIEW COMPARISON:  Chest radiograph performed 03/06/2017 FINDINGS: The lungs are well-aerated and  clear. There is no evidence of focal opacification, pleural effusion or pneumothorax. The heart is normal in size; the mediastinal contour is within normal limits. No acute osseous abnormalities are seen. IMPRESSION: No acute cardiopulmonary process seen. No displaced rib fractures identified. Electronically Signed   By: Garald Balding M.D.   On: 03/13/2017 00:44   Dg Thoracic Spine 2 View  Result Date: 03/13/2017 CLINICAL DATA:  Found down for unknown amount of time, with upper back pain. Initial encounter. EXAM: THORACIC SPINE 2 VIEWS COMPARISON:  Chest radiograph performed 03/06/2017 FINDINGS:  There is no evidence of fracture or subluxation. Vertebral bodies demonstrate normal height and alignment. Intervertebral disc spaces are preserved. The visualized portions of both lungs are clear. The mediastinum is unremarkable in appearance. IMPRESSION: No evidence of fracture or subluxation along the thoracic spine. Electronically Signed   By: Garald Balding M.D.   On: 03/13/2017 00:43   Dg Lumbar Spine Complete  Result Date: 03/13/2017 CLINICAL DATA:  Found down for unknown amount of time, with lower back pain. Initial encounter. EXAM: LUMBAR SPINE - COMPLETE 4+ VIEW COMPARISON:  CT of the abdomen and pelvis from 07/18/2015 FINDINGS: There is no evidence of fracture or subluxation. Vertebral bodies demonstrate normal height and alignment. Intervertebral disc spaces are preserved. The visualized neural foramina are grossly unremarkable in appearance. The visualized bowel gas pattern is unremarkable in appearance; air and stool are noted within the colon. The sacroiliac joints are within normal limits. IMPRESSION: No evidence of fracture or subluxation along the lumbar spine. Electronically Signed   By: Garald Balding M.D.   On: 03/13/2017 00:42   Ct Head Wo Contrast  Result Date: 03/13/2017 CLINICAL DATA:  Altered level of consciousness.  Patient found down. EXAM: CT HEAD WITHOUT CONTRAST CT CERVICAL SPINE  WITHOUT CONTRAST TECHNIQUE: Multidetector CT imaging of the head and cervical spine was performed following the standard protocol without intravenous contrast. Multiplanar CT image reconstructions of the cervical spine were also generated. COMPARISON:  Head and cervical spine CT 07/18/2015 FINDINGS: CT HEAD FINDINGS Brain: Generalized cerebral atrophy.No intracranial hemorrhage, mass effect, or midline shift. No hydrocephalus. The basilar cisterns are patent. No evidence of territorial infarct. No extra-axial or intracranial fluid collection. Vascular: Atherosclerosis of skullbase vasculature without hyperdense vessel or abnormal calcification. Skull: No skull fracture.  No focal lesion. Sinuses/Orbits: Mucosal thickening of scattered ethmoid air cells. Fluid level in right and left sphenoid sinus and right maxillary sinus. Mastoid air cells are well-aerated. Visualized orbits are unremarkable. Other: None. CT CERVICAL SPINE FINDINGS Alignment: Trace anterolisthesis of C3 on C4 is unchanged from prior exam. There is widening and irregularity of the left C3-C4 facet that is new from prior exam but appears chronic. No traumatic subluxation. Skull base and vertebrae: No acute fracture. Vertebral body heights are maintained. Skull base and dens are intact. Soft tissues and spinal canal: No prevertebral fluid or swelling. No visible canal hematoma. Disc levels: Disc space narrowing and endplate spurring at D7-A1 and to a lesser extent C4-C5. Upper chest: A few scattered ground-glass opacities at the left greater than right lung apex, new from chest CT 01/11/2017 Other: None. IMPRESSION: 1.  No acute intracranial abnormality.  No skull fracture. 2. No acute fracture or subluxation of the cervical spine. 3. Widening of the left C3-C4 facet with associated joint space irregularity suggests inflammatory or degenerative arthropathy. This does not appear traumatic. MRI could be considered if there is history of neck pain. 4.  Nonspecific left upper lobe ground-glass opacities, atypical infection versus pulmonary edema. Electronically Signed   By: Jeb Levering M.D.   On: 03/13/2017 01:00   Ct Cervical Spine Wo Contrast  Result Date: 03/13/2017 CLINICAL DATA:  Altered level of consciousness.  Patient found down. EXAM: CT HEAD WITHOUT CONTRAST CT CERVICAL SPINE WITHOUT CONTRAST TECHNIQUE: Multidetector CT imaging of the head and cervical spine was performed following the standard protocol without intravenous contrast. Multiplanar CT image reconstructions of the cervical spine were also generated. COMPARISON:  Head and cervical spine CT 07/18/2015 FINDINGS: CT HEAD FINDINGS Brain: Generalized cerebral  atrophy.No intracranial hemorrhage, mass effect, or midline shift. No hydrocephalus. The basilar cisterns are patent. No evidence of territorial infarct. No extra-axial or intracranial fluid collection. Vascular: Atherosclerosis of skullbase vasculature without hyperdense vessel or abnormal calcification. Skull: No skull fracture.  No focal lesion. Sinuses/Orbits: Mucosal thickening of scattered ethmoid air cells. Fluid level in right and left sphenoid sinus and right maxillary sinus. Mastoid air cells are well-aerated. Visualized orbits are unremarkable. Other: None. CT CERVICAL SPINE FINDINGS Alignment: Trace anterolisthesis of C3 on C4 is unchanged from prior exam. There is widening and irregularity of the left C3-C4 facet that is new from prior exam but appears chronic. No traumatic subluxation. Skull base and vertebrae: No acute fracture. Vertebral body heights are maintained. Skull base and dens are intact. Soft tissues and spinal canal: No prevertebral fluid or swelling. No visible canal hematoma. Disc levels: Disc space narrowing and endplate spurring at D1-V6 and to a lesser extent C4-C5. Upper chest: A few scattered ground-glass opacities at the left greater than right lung apex, new from chest CT 01/11/2017 Other: None.  IMPRESSION: 1.  No acute intracranial abnormality.  No skull fracture. 2. No acute fracture or subluxation of the cervical spine. 3. Widening of the left C3-C4 facet with associated joint space irregularity suggests inflammatory or degenerative arthropathy. This does not appear traumatic. MRI could be considered if there is history of neck pain. 4. Nonspecific left upper lobe ground-glass opacities, atypical infection versus pulmonary edema. Electronically Signed   By: Jeb Levering M.D.   On: 03/13/2017 01:00    Cardiac Studies   TTE: Pending  Patient Profile     66 y.o. female with PMH of HTN, GERD, thrombocythemia, OA and depression who was found down at home, and presented with rhabdomyolysis with positive troponin.   Assessment & Plan    1. Elevated Troponin: Trop peaked at 21.50 in the setting of rhabdo with CK total 32,000. Denies any chest pain, dizziness, or dyspnea prior to admission. Will need ischemic work up, but Cr worse today at 3.9. Will need to wait for renal function to stabilize.  -- currently on heparin -- echo pending  2. Rhabdomyolysis: Found down by her family in her home. States she was collecting clothes to donate, while leaning over in the closet an angel figurine fell off the shelf and hit her on the head, knocking her out. CK total 32,000 on admission, now improving.  -- currently on IVFs  3. ARF: Cr 2.97 on admission. Worse today at 3.9, with no UOP reported.  -- continued on IVFs    4. Elevated LFTs: AST 876/ALT 201 -- CT abd/pelvis non acute  5. Prolonged Qtc: Consider reducing Celexa dosing given this finding.  -- defer to primary   Signed, Reino Bellis, NP  03/14/2017, 11:41 AM  Pager # [phone]   Agree with note by Reino Bellis NP-C   Ms. Risk is well-known to me from prior evaluation in the office for chest pain which was unrevealing. She was admitted on 03/12/17 because of head trauma, rhabdomyolysis and elevated serum troponin. She had  no chest pain prior to the event. She was on the head with a guardian angel figurine. Her troponin peaked at 16. There was no syncope. Her EKG showed no acute changes. Her exam is benign. I will reasonably that this was ischemically mediated or this represents acute coronary syndrome versus patellar troponin's part in parcel with the acute renal failure and rhabdomyolysis. No further workup is indicated at this  time.  Lorretta Harp, M.D., Garret Reddish, FAHA, [place] [address]. [place], [place]  [address]  [phone] 03/14/2017 11:58 AM

## 2017-03-14 NOTE — Consult Note (Signed)
[place] Nurse wound consult note Reason for Consult:right shoulder and head wounds, partial, full thickness and unstageable Wound type:traumatic Pressure Injury POA: Yes Measurement:right shoulder 6cm x 15cm 70% black tissue, 20% yellow slough, 10% pink, serous drainage, no odor Left shoulder 3cm x 6cm x 0.1cm unstageable with 70% white and yellow slough, 30% pink, serous drainage, no odor Top of head has 3cm x 3cm x 0.1cm full thickness wound with 50% pink wound bed and 50% scabbing over the perimeter, serous drainage dried all over scalp, no odor. Also has 1cm x 0.5cm with 100% yellow slough with serous drainage, no odor Wound bed: see above Drainage (amount, consistency, odor) see above Periwound:see above Dressing procedure/placement/frequency: Have consulted PT for hydrotherapy to Right shoulder wound with dressing orders for Santyl. I have provided nurses with orders for Santyl to smaller scalp wound and left shoulder wound. Bedside nurses to do Right shoulder dressings on Sunday when PT not available.  Bacitracin to larger scalp wound, leave open to air. We will follow this patient and remain available to this patient, nursing, and the medical and surgical teams. I will recheck wounds Friday, 8/17.   Fara Olden, RN-C, WTA-C, [facility]

## 2017-03-14 NOTE — Progress Notes (Signed)
Pt bladder scanned around 2230. Multiple scans done, with 24 mLs the highest value obtained. Pt denies urge to urinate. Tried to have BM earlier and was interrupted, urge went away. Instructed pt to notify RN if she had the urge to urinate or felt bladder distention. Will continue to closely monitor.  Jaymes Graff, RN

## 2017-03-15 DIAGNOSIS — R748 Abnormal levels of other serum enzymes: Secondary | ICD-10-CM

## 2017-03-15 LAB — CBC WITH DIFFERENTIAL/PLATELET
Basophils Absolute: 0 10*3/uL (ref 0.0–0.1)
Basophils Relative: 0 %
Eosinophils Absolute: 0.3 10*3/uL (ref 0.0–0.7)
Eosinophils Relative: 4 %
HCT: 29.7 % — ABNORMAL LOW (ref 36.0–46.0)
Hemoglobin: 9.6 g/dL — ABNORMAL LOW (ref 12.0–15.0)
Lymphocytes Relative: 8 %
Lymphs Abs: 0.7 10*3/uL (ref 0.7–4.0)
MCH: 41 pg — ABNORMAL HIGH (ref 26.0–34.0)
MCHC: 32.3 g/dL (ref 30.0–36.0)
MCV: 126.9 fL — ABNORMAL HIGH (ref 78.0–100.0)
Monocytes Absolute: 0.3 10*3/uL (ref 0.1–1.0)
Monocytes Relative: 4 %
Neutro Abs: 6.9 10*3/uL (ref 1.7–7.7)
Neutrophils Relative %: 84 %
Platelets: 351 10*3/uL (ref 150–400)
RBC: 2.34 MIL/uL — ABNORMAL LOW (ref 3.87–5.11)
RDW: 14.9 % (ref 11.5–15.5)
WBC: 8.2 10*3/uL (ref 4.0–10.5)

## 2017-03-15 LAB — HEPARIN LEVEL (UNFRACTIONATED): Heparin Unfractionated: 0.3 IU/mL (ref 0.30–0.70)

## 2017-03-15 LAB — CK TOTAL AND CKMB (NOT AT ARMC)
CK, MB: 79.6 ng/mL — ABNORMAL HIGH (ref 0.5–5.0)
Relative Index: 2.7 — ABNORMAL HIGH (ref 0.0–2.5)
Total CK: 2990 U/L — ABNORMAL HIGH (ref 38–234)

## 2017-03-15 LAB — COMPREHENSIVE METABOLIC PANEL
ALT: 149 U/L — ABNORMAL HIGH (ref 14–54)
AST: 247 U/L — ABNORMAL HIGH (ref 15–41)
Albumin: 2.7 g/dL — ABNORMAL LOW (ref 3.5–5.0)
Alkaline Phosphatase: 58 U/L (ref 38–126)
Anion gap: 11 (ref 5–15)
BUN: 81 mg/dL — ABNORMAL HIGH (ref 6–20)
CO2: 17 mmol/L — ABNORMAL LOW (ref 22–32)
Calcium: 7.7 mg/dL — ABNORMAL LOW (ref 8.9–10.3)
Chloride: 110 mmol/L (ref 101–111)
Creatinine, Ser: 4.87 mg/dL — ABNORMAL HIGH (ref 0.44–1.00)
GFR calc Af Amer: 10 mL/min — ABNORMAL LOW (ref >60)
GFR calc non Af Amer: 9 mL/min — ABNORMAL LOW (ref >60)
Glucose, Bld: 86 mg/dL (ref 65–99)
Potassium: 4.1 mmol/L (ref 3.5–5.1)
Sodium: 138 mmol/L (ref 135–145)
Total Bilirubin: 0.6 mg/dL (ref 0.3–1.2)
Total Protein: 5.6 g/dL — ABNORMAL LOW (ref 6.5–8.1)

## 2017-03-15 LAB — TROPONIN I: Troponin I: 1.92 ng/mL (ref <0.03)

## 2017-03-15 LAB — PHOSPHORUS: Phosphorus: 5.3 mg/dL — ABNORMAL HIGH (ref 2.5–4.6)

## 2017-03-15 LAB — MAGNESIUM: Magnesium: 2.1 mg/dL (ref 1.7–2.4)

## 2017-03-15 MED ORDER — STERILE WATER FOR INJECTION IV SOLN
INTRAVENOUS | Status: DC
  Administered 2017-03-15 – 2017-03-18 (×5): via INTRAVENOUS
  Filled 2017-03-15 (×11): qty 850

## 2017-03-15 NOTE — Progress Notes (Signed)
Patient ID: Rachael Johnson, female   DOB: [birth year], 66 y.o.   MRN: [id number] S:Feels better O:BP 100/67 (BP Location: Left Arm)   Pulse 70   Temp 97.7 F (36.5 C) (Oral)   Resp 18   Ht 5\' 3"  (1.6 m)   Wt 65.7 kg (144 lb 14.4 oz)   LMP 07/31/1990   SpO2 100%   BMI 25.67 kg/m   Intake/Output Summary (Last 24 hours) at 03/15/17 1224 Last data filed at 03/14/17 1700  Gross per 24 hour  Intake              240 ml  Output                0 ml  Net              240 ml   Intake/Output: I/O last 3 completed shifts: In: 1884 [P.O.:480; I.V.:1404] Out: 0   Intake/Output this shift:  No intake/output data recorded. Weight change: -0.181 kg (-6.4 oz) Gen:NAD CVS:no rub Resp: cta ZTI:[id number] Ext: no pitting edema   Recent Labs Lab 03/12/17 2320 03/12/17 2352 03/13/17 0720 03/14/17 0840 03/15/17 0306  NA 146* 147* 141 140 138  K 3.7 3.8 3.5 3.5 4.1  CL 112* 116* 109 107 110  CO2 16*  --  21* 21* 17*  GLUCOSE 117* 110* 153* 131* 86  BUN 73* 69* 70* 77* 81*  CREATININE 2.97* 2.70* 2.59* 3.94* 4.87*  ALBUMIN 4.0  --   --  2.9* 2.7*  CALCIUM 8.7*  --  7.9* 7.6* 7.7*  PHOS  --   --   --  4.5 5.3*  AST 876*  --   --  378* 247*  ALT 201*  --   --  172* 149*   Liver Function Tests:  Recent Labs Lab 03/12/17 2320 03/14/17 0840 03/15/17 0306  AST 876* 378* 247*  ALT 201* 172* 149*  ALKPHOS 62 48 58  BILITOT 1.4* 0.5 0.6  PROT 7.5 5.6* 5.6*  ALBUMIN 4.0 2.9* 2.7*   No results for input(s): LIPASE, AMYLASE in the last 168 hours.  Recent Labs Lab 03/13/17 0231  AMMONIA 35   CBC:  Recent Labs Lab 03/12/17 2320  03/13/17 2046 03/14/17 0224 03/15/17 0306  WBC 11.6*  --  10.4 8.9 8.2  NEUTROABS  --   --   --   --  6.9  HGB 12.9  < > 11.0* 10.0* 9.6*  HCT 37.8  < > 33.3* 30.9* 29.7*  MCV 122.7*  --  123.8* 124.1* 126.9*  PLT 588*  --  554* 440* 351  < > = values in this interval not displayed. Cardiac Enzymes:  Recent Labs Lab 03/12/17 2320 03/13/17 0231  03/13/17 0338 03/13/17 0720 03/13/17 1347 03/14/17 0840 03/15/17 0306  CKTOTAL 32,176*  --   --   --   --  7,493* 2,990*  CKMB  --   --   --   --   --  152.2* 79.6*  TROPONINI 21.50* 16.65* 16.09* 14.51* 10.38*  --  1.92*   CBG:  Recent Labs Lab 03/12/17 2334  GLUCAP 112*    Iron Studies: No results for input(s): IRON, TIBC, TRANSFERRIN, FERRITIN in the last 72 hours. Studies/Results: US Renal  Result Date: 03/14/2017 CLINICAL DATA:  Acute kidney injury. EXAM: RENAL / URINARY TRACT ULTRASOUND COMPLETE COMPARISON:  Noncontrast CT yesterday. FINDINGS: Right Kidney: Length: 12.2 cm. No hydronephrosis. There is a 1.8 cm cyst in the interpolar kidney. Small shadowing  calculus in the mid upper kidney. Renal echogenicity is normal. Trace perinephric fluid laterally. Left Kidney: Length: 10.9 cm. Echogenicity within normal limits. No mass or hydronephrosis visualized. Small stone on prior CT not appreciated sonographically. Bladder: Foley catheter in place, bladder not visualized. IMPRESSION: 1. Nonobstructing right nephrolithiasis.  No hydronephrosis. 2. Trace right perinephric fluid, nonspecific. Electronically Signed   By: Jeb Levering M.D.   On: 03/14/2017 23:30   . amitriptyline  20 mg Oral QHS  . aspirin EC  81 mg Oral Daily  . bacitracin   Topical BID  . busPIRone  10 mg Oral TID  . citalopram  20 mg Oral q morning - 10a  . collagenase   Topical Daily  . cycloSPORINE  1 drop Both Eyes BID  . famotidine  20 mg Oral QHS  . fluticasone  2 spray Each Nare Daily  . levothyroxine  88 mcg Oral QAC breakfast  . loratadine  10 mg Oral Daily  . mometasone-formoterol  2 puff Inhalation BID  . montelukast  10 mg Oral QHS  . multivitamin   Oral q morning - 10a  . pantoprazole  40 mg Oral BID  . pramipexole  0.25-0.5 mg Oral QHS  . venlafaxine XR  75 mg Oral Q breakfast    BMET    Component Value Date/Time   NA 138 03/15/2017 0306   NA 140 01/11/2017 0938   NA 140 11/29/2016 0936    K 4.1 03/15/2017 0306   K 3.5 01/11/2017 0938   K 4.2 11/29/2016 0936   CL 110 03/15/2017 0306   CL 103 01/11/2017 0938   CO2 17 (L) 03/15/2017 0306   CO2 31 01/11/2017 0938   CO2 27 11/29/2016 0936   GLUCOSE 86 03/15/2017 0306   GLUCOSE 116 01/11/2017 0938   BUN 81 (H) 03/15/2017 0306   BUN 13 01/11/2017 0938   BUN 18.3 11/29/2016 0936   CREATININE 4.87 (H) 03/15/2017 0306   CREATININE 0.9 01/11/2017 0938   CREATININE 0.8 11/29/2016 0936   CALCIUM 7.7 (L) 03/15/2017 0306   CALCIUM 8.7 01/11/2017 0938   CALCIUM 8.9 11/29/2016 0936   GFRNONAA 9 (L) 03/15/2017 0306   GFRAA 10 (L) 03/15/2017 0306   CBC    Component Value Date/Time   WBC 8.2 03/15/2017 0306   RBC 2.34 (L) 03/15/2017 0306   HGB 9.6 (L) 03/15/2017 0306   HGB 10.8 (L) 01/11/2017 0938   HGB 13.9 10/11/2007 0810   HCT 29.7 (L) 03/15/2017 0306   HCT 35.1 03/13/2017 0338   HCT 32.5 (L) 01/11/2017 0938   HCT 40.6 10/11/2007 0810   PLT 351 03/15/2017 0306   PLT 286 01/11/2017 0938   PLT 502 (H) 10/11/2007 0810   MCV 126.9 (H) 03/15/2017 0306   MCV 127 (H) 01/11/2017 0938   MCV 110.1 (H) 10/11/2007 0810   MCH 41.0 (H) 03/15/2017 0306   MCHC 32.3 03/15/2017 0306   RDW 14.9 03/15/2017 0306   RDW 13.9 01/11/2017 0938   RDW 13.0 10/11/2007 0810   LYMPHSABS 0.7 03/15/2017 0306   LYMPHSABS 0.7 (L) 01/11/2017 0938   LYMPHSABS 1.6 10/11/2007 0810   MONOABS 0.3 03/15/2017 0306   MONOABS 0.5 10/11/2007 0810   EOSABS 0.3 03/15/2017 0306   EOSABS 0.1 01/11/2017 0938   BASOSABS 0.0 03/15/2017 0306   BASOSABS 0.0 01/11/2017 0938   BASOSABS 0.0 10/11/2007 0810     Assessment/Plan:  1. AKI, anuric/oliguric in setting of rhabdomyolysis +/- NSAIDs.  Continue with IVF's and change  to isotonic bicarb due to metabolic acidosis and rhabdo.  No indication for dialysis at this time. 2. Metabolic acidosis due to #1 3. Rhabdomyolysis- CK's are improving with IVF's.   Donetta Potts, MD [employer] [phone]

## 2017-03-15 NOTE — Progress Notes (Signed)
ANTICOAGULATION CONSULT NOTE - Follow Up Consult  Pharmacy Consult for Heparin Indication: chest pain/ACS  No Known Allergies  Patient Measurements: Height: 5\' 3"  (160 cm) Weight: 144 lb 14.4 oz (65.7 kg) IBW/kg (Calculated) : 52.4 Heparin Dosing Weight: 65.7 kg  Vital Signs: Temp: 97.7 F (36.5 C) (08/16 0800) Temp Source: Oral (08/16 0800) BP: 123/68 (08/16 0800) Pulse Rate: 70 (08/16 0800)  Labs:  Recent Labs  03/12/17 2320  03/13/17 0016  03/13/17 0720  03/13/17 1347 03/13/17 2046 03/14/17 0224 03/14/17 0840 03/15/17 0306  HGB 12.9  < >  --   --   --   --   --  11.0* 10.0*  --  9.6*  HCT 37.8  < >  --   < >  --   --   --  33.3* 30.9*  --  29.7*  PLT 588*  --   --   --   --   --   --  554* 440*  --  351  APTT  --   --  29  --   --   --   --   --   --   --   --   LABPROT  --   --  15.6*  --   --   --   --   --   --   --   --   INR  --   --  1.23  --   --   --   --   --   --   --   --   HEPARINUNFRC  --   --   --   --   --   < >  --  0.35 0.44  --  0.30  CREATININE 2.97*  < >  --   --  2.59*  --   --   --   --  3.94* 4.87*  CKTOTAL 32,176*  --   --   --   --   --   --   --   --  7,493* 2,990*  CKMB  --   --   --   --   --   --   --   --   --  152.2* 79.6*  TROPONINI 21.50*  --   --   < > 14.51*  --  10.38*  --   --   --  1.92*  < > = values in this interval not displayed.  Estimated Creatinine Clearance: 10.5 mL/min (A) (by C-G formula based on SCr of 4.87 mg/dL (H)).  Assessment:  66 yo female admitted 8/13 pm with found down/rhabdo and AKI. Patient with elevated troponins and chest pain; pharmacy consulted to dose heparin. No PTA oral anticoagulation per med rec.     Heparin level remains therapeutic (0.30) on 1000 units/hr, but just at lower limit of target range.  No bleeding or infusion problems noted.  No further ischemic workup planned per Cardiology.      QTc was prolonged on 8/15 am. Celexa decreased 40>20 mg daily by Cardiology.  Goal of Therapy:   Heparin level 0.3-0.7 units/ml Monitor platelets by anticoagulation protocol: Yes   Plan:   Increase heparin drip to 1100 units/hr.  Daily heparin level and CBC while on heparin.  Will follow up anticoagulation plans.  Arty Baumgartner, [place] Pager: [phone] 03/15/2017,11:39 AM
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
## 2017-03-15 NOTE — Progress Notes (Signed)
Physical Therapy Wound Treatment Patient Details  Name: Rachael Johnson MRN: [id number] Date of Birth: 25-Oct-1950  Today's Date: 03/15/2017 Time: [id number] Time Calculation (min): 34 min  Subjective   There were clothes and stuff all around and I couldn't get up.    Pain Score:  up to 8/10 with palpation  Wound Assessment     Wound / Incision (Open or Dehisced) 03/13/17 Non-pressure wound Shoulder Right;Posterior (Active)  Dressing Type ABD;Gauze (Comment);Moist to dry 03/15/2017 12:44 PM  Dressing Changed Changed 03/15/2017 12:44 PM  Dressing Status Clean;Dry;Intact 03/15/2017 12:44 PM  Dressing Change Frequency Daily 03/15/2017 12:44 PM  Site / Wound Assessment Brown;Yellow;Pink 03/15/2017 12:44 PM  % Wound base Red or Granulating 40% 03/15/2017 12:44 PM  % Wound base Yellow/Fibrinous Exudate 60% 03/15/2017 12:44 PM  Peri-wound Assessment Erythema (blanchable) 03/15/2017 12:44 PM  Wound Length (cm) 8 cm 03/15/2017 12:44 PM  Wound Width (cm) 10 cm 03/15/2017 12:44 PM  Wound Depth (cm) 0 cm 03/15/2017 12:44 PM  Margins Unattached edges (unapproximated) 03/15/2017 12:44 PM  Closure None 03/15/2017 12:44 PM  Drainage Amount Scant 03/15/2017 12:44 PM  Drainage Description Serous 03/15/2017 12:44 PM  Non-staged Wound Description Partial thickness 03/14/2017  9:45 AM  Treatment Cleansed;Debridement (Selective);Hydrotherapy (Pulse lavage);Other (Comment) 03/15/2017 12:44 PM      Hydrotherapy Pulsed lavage therapy - wound location: R posterior shoulder Pulsed Lavage with Suction (psi): 8 psi Pulsed Lavage with Suction - Normal Saline Used: 1000 mL Pulsed Lavage Tip: Tip with splash shield Selective Debridement Selective Debridement - Location: posterior shoulder Selective Debridement - Tools Used: Forceps;Scalpel Selective Debridement - Tissue Removed: scored adherent yellow eschar   Wound Assessment and Plan  Wound Therapy - Assess/Plan/Recommendations Wound Therapy - Clinical Statement: pt can  benefit from PLS to soften tenacious yellow eschar for selective debridement.  Expect wound to be relatively superficial. Wound Therapy - Functional Problem List: pt usually functional and independent Hydrotherapy Plan: Debridement;Dressing change;Patient/family education Wound Therapy - Frequency: 6X / week Wound Therapy - Current Recommendations: PT Wound Therapy - Follow Up Recommendations: Home health RN Wound Plan: see above  Wound Therapy Goals- Improve the function of patient's integumentary system by progressing the wound(s) through the phases of wound healing (inflammation - proliferation - remodeling) by: Decrease Necrotic Tissue to: 25% Decrease Necrotic Tissue - Progress: Goal set today Increase Granulation Tissue to: 75% Increase Granulation Tissue - Progress: Goal set today Goals/treatment plan/discharge plan were made with and agreed upon by patient/family: Yes Time For Goal Achievement: 7 days Wound Therapy - Potential for Goals: Good  Goals will be updated until maximal potential achieved or discharge criteria met.  Discharge criteria: when goals achieved, discharge from hospital, MD decision/surgical intervention, no progress towards goals, refusal/missing three consecutive treatments without notification or medical reason.  GP     Rachael Johnson Rachael Johnson 03/15/2017, 12:52 PM 03/15/2017  Rachael Johnson, [name] [phone]  (pager)

## 2017-03-16 DIAGNOSIS — T796XXA Traumatic ischemia of muscle, initial encounter: Secondary | ICD-10-CM

## 2017-03-16 LAB — CBC WITH DIFFERENTIAL/PLATELET
Basophils Absolute: 0 10*3/uL (ref 0.0–0.1)
Basophils Relative: 0 %
Eosinophils Absolute: 0.4 10*3/uL (ref 0.0–0.7)
Eosinophils Relative: 5 %
HCT: 27.7 % — ABNORMAL LOW (ref 36.0–46.0)
Hemoglobin: 9 g/dL — ABNORMAL LOW (ref 12.0–15.0)
Lymphocytes Relative: 6 %
Lymphs Abs: 0.5 10*3/uL — ABNORMAL LOW (ref 0.7–4.0)
MCH: 40.5 pg — ABNORMAL HIGH (ref 26.0–34.0)
MCHC: 32.5 g/dL (ref 30.0–36.0)
MCV: 124.8 fL — ABNORMAL HIGH (ref 78.0–100.0)
Monocytes Absolute: 0.5 10*3/uL (ref 0.1–1.0)
Monocytes Relative: 6 %
Neutro Abs: 6.4 10*3/uL (ref 1.7–7.7)
Neutrophils Relative %: 83 %
Platelets: 338 10*3/uL (ref 150–400)
RBC: 2.22 MIL/uL — ABNORMAL LOW (ref 3.87–5.11)
RDW: 14.7 % (ref 11.5–15.5)
WBC: 7.8 10*3/uL (ref 4.0–10.5)

## 2017-03-16 LAB — CK TOTAL AND CKMB (NOT AT ARMC)
CK, MB: 31.1 ng/mL — ABNORMAL HIGH (ref 0.5–5.0)
Relative Index: 2.9 — ABNORMAL HIGH (ref 0.0–2.5)
Total CK: 1059 U/L — ABNORMAL HIGH (ref 38–234)

## 2017-03-16 LAB — COMPREHENSIVE METABOLIC PANEL
ALT: 120 U/L — ABNORMAL HIGH (ref 14–54)
AST: 138 U/L — ABNORMAL HIGH (ref 15–41)
Albumin: 2.5 g/dL — ABNORMAL LOW (ref 3.5–5.0)
Alkaline Phosphatase: 65 U/L (ref 38–126)
Anion gap: 11 (ref 5–15)
BUN: 90 mg/dL — ABNORMAL HIGH (ref 6–20)
CO2: 22 mmol/L (ref 22–32)
Calcium: 7.6 mg/dL — ABNORMAL LOW (ref 8.9–10.3)
Chloride: 105 mmol/L (ref 101–111)
Creatinine, Ser: 6.21 mg/dL — ABNORMAL HIGH (ref 0.44–1.00)
GFR calc Af Amer: 7 mL/min — ABNORMAL LOW (ref >60)
GFR calc non Af Amer: 6 mL/min — ABNORMAL LOW (ref >60)
Glucose, Bld: 97 mg/dL (ref 65–99)
Potassium: 4.1 mmol/L (ref 3.5–5.1)
Sodium: 138 mmol/L (ref 135–145)
Total Bilirubin: 0.6 mg/dL (ref 0.3–1.2)
Total Protein: 5.4 g/dL — ABNORMAL LOW (ref 6.5–8.1)

## 2017-03-16 LAB — HEPARIN LEVEL (UNFRACTIONATED): Heparin Unfractionated: 0.21 IU/mL — ABNORMAL LOW (ref 0.30–0.70)

## 2017-03-16 LAB — PHOSPHORUS: Phosphorus: 5.3 mg/dL — ABNORMAL HIGH (ref 2.5–4.6)

## 2017-03-16 LAB — MAGNESIUM: Magnesium: 1.8 mg/dL (ref 1.7–2.4)

## 2017-03-16 MED ORDER — FUROSEMIDE 10 MG/ML IJ SOLN
40.0000 mg | Freq: Once | INTRAMUSCULAR | Status: AC
  Administered 2017-03-16: 40 mg via INTRAVENOUS
  Filled 2017-03-16: qty 4

## 2017-03-16 NOTE — Progress Notes (Signed)
Patient ID: NIDYA BOUYER, female   DOB: 10/13/1950, 66 y.o.   MRN: [id number] S: Still complains of being sore O:BP 103/69   Pulse 88   Temp 98.4 F (36.9 C) (Oral)   Resp 14   Ht 5\' 3"  (1.6 m)   Wt 70.1 kg (154 lb 9.6 oz)   LMP 07/31/1990   SpO2 93%   BMI 27.39 kg/m   Intake/Output Summary (Last 24 hours) at 03/16/17 1228 Last data filed at 03/16/17 0704  Gross per 24 hour  Intake          1798.83 ml  Output              675 ml  Net          1123.83 ml   Intake/Output: I/O last 3 completed shifts: In: 1738.8 [I.V.:1738.8] Out: 300 [Stool:300]  Intake/Output this shift:  Total I/O In: 60 [P.O.:60] Out: 375 [Urine:375] Weight change: 4.4 kg (9 lb 11.2 oz) Gen:NAD CVS:no rub Resp:cta BUL:AGTXMI Ext: no pitting edema   Recent Labs Lab 03/12/17 2320 03/12/17 2352 03/13/17 0720 03/14/17 0840 03/15/17 0306 03/16/17 0223  NA 146* 147* 141 140 138 138  K 3.7 3.8 3.5 3.5 4.1 4.1  CL 112* 116* 109 107 110 105  CO2 16*  --  21* 21* 17* 22  GLUCOSE 117* 110* 153* 131* 86 97  BUN 73* 69* 70* 77* 81* 90*  CREATININE 2.97* 2.70* 2.59* 3.94* 4.87* 6.21*  ALBUMIN 4.0  --   --  2.9* 2.7* 2.5*  CALCIUM 8.7*  --  7.9* 7.6* 7.7* 7.6*  PHOS  --   --   --  4.5 5.3* 5.3*  AST 876*  --   --  378* 247* 138*  ALT 201*  --   --  172* 149* 120*   Liver Function Tests:  Recent Labs Lab 03/14/17 0840 03/15/17 0306 03/16/17 0223  AST 378* 247* 138*  ALT 172* 149* 120*  ALKPHOS 48 58 65  BILITOT 0.5 0.6 0.6  PROT 5.6* 5.6* 5.4*  ALBUMIN 2.9* 2.7* 2.5*   No results for input(s): LIPASE, AMYLASE in the last 168 hours.  Recent Labs Lab 03/13/17 0231  AMMONIA 35   CBC:  Recent Labs Lab 03/12/17 2320  03/13/17 2046 03/14/17 0224 03/15/17 0306 03/16/17 0223  WBC 11.6*  --  10.4 8.9 8.2 7.8  NEUTROABS  --   --   --   --  6.9 6.4  HGB 12.9  < > 11.0* 10.0* 9.6* 9.0*  HCT 37.8  < > 33.3* 30.9* 29.7* 27.7*  MCV 122.7*  --  123.8* 124.1* 126.9* 124.8*  PLT 588*  --   554* 440* 351 338  < > = values in this interval not displayed. Cardiac Enzymes:  Recent Labs Lab 03/12/17 2320 03/13/17 0231 03/13/17 0338 03/13/17 0720 03/13/17 1347 03/14/17 0840 03/15/17 0306 03/16/17 0223  CKTOTAL 32,176*  --   --   --   --  7,493* 2,990* 1,059*  CKMB  --   --   --   --   --  152.2* 79.6* 31.1*  TROPONINI 21.50* 16.65* 16.09* 14.51* 10.38*  --  1.92*  --    CBG:  Recent Labs Lab 03/12/17 2334  GLUCAP 112*    Iron Studies: No results for input(s): IRON, TIBC, TRANSFERRIN, FERRITIN in the last 72 hours. Studies/Results: US Renal  Result Date: 03/14/2017 CLINICAL DATA:  Acute kidney injury. EXAM: RENAL / URINARY TRACT ULTRASOUND COMPLETE COMPARISON:  Noncontrast CT yesterday. FINDINGS: Right Kidney: Length: 12.2 cm. No hydronephrosis. There is a 1.8 cm cyst in the interpolar kidney. Small shadowing calculus in the mid upper kidney. Renal echogenicity is normal. Trace perinephric fluid laterally. Left Kidney: Length: 10.9 cm. Echogenicity within normal limits. No mass or hydronephrosis visualized. Small stone on prior CT not appreciated sonographically. Bladder: Foley catheter in place, bladder not visualized. IMPRESSION: 1. Nonobstructing right nephrolithiasis.  No hydronephrosis. 2. Trace right perinephric fluid, nonspecific. Electronically Signed   By: Jeb Levering M.D.   On: 03/14/2017 23:30   . amitriptyline  20 mg Oral QHS  . aspirin EC  81 mg Oral Daily  . bacitracin   Topical BID  . busPIRone  10 mg Oral TID  . citalopram  20 mg Oral q morning - 10a  . collagenase   Topical Daily  . cycloSPORINE  1 drop Both Eyes BID  . famotidine  20 mg Oral QHS  . fluticasone  2 spray Each Nare Daily  . levothyroxine  88 mcg Oral QAC breakfast  . loratadine  10 mg Oral Daily  . mometasone-formoterol  2 puff Inhalation BID  . montelukast  10 mg Oral QHS  . multivitamin   Oral q morning - 10a  . pantoprazole  40 mg Oral BID  . pramipexole  0.25-0.5 mg Oral  QHS  . venlafaxine XR  75 mg Oral Q breakfast    BMET    Component Value Date/Time   NA 138 03/16/2017 0223   NA 140 01/11/2017 0938   NA 140 11/29/2016 0936   K 4.1 03/16/2017 0223   K 3.5 01/11/2017 0938   K 4.2 11/29/2016 0936   CL 105 03/16/2017 0223   CL 103 01/11/2017 0938   CO2 22 03/16/2017 0223   CO2 31 01/11/2017 0938   CO2 27 11/29/2016 0936   GLUCOSE 97 03/16/2017 0223   GLUCOSE 116 01/11/2017 0938   BUN 90 (H) 03/16/2017 0223   BUN 13 01/11/2017 0938   BUN 18.3 11/29/2016 0936   CREATININE 6.21 (H) 03/16/2017 0223   CREATININE 0.9 01/11/2017 0938   CREATININE 0.8 11/29/2016 0936   CALCIUM 7.6 (L) 03/16/2017 0223   CALCIUM 8.7 01/11/2017 0938   CALCIUM 8.9 11/29/2016 0936   GFRNONAA 6 (L) 03/16/2017 0223   GFRAA 7 (L) 03/16/2017 0223   CBC    Component Value Date/Time   WBC 7.8 03/16/2017 0223   RBC 2.22 (L) 03/16/2017 0223   HGB 9.0 (L) 03/16/2017 0223   HGB 10.8 (L) 01/11/2017 0938   HGB 13.9 10/11/2007 0810   HCT 27.7 (L) 03/16/2017 0223   HCT 35.1 03/13/2017 0338   HCT 32.5 (L) 01/11/2017 0938   HCT 40.6 10/11/2007 0810   PLT 338 03/16/2017 0223   PLT 286 01/11/2017 0938   PLT 502 (H) 10/11/2007 0810   MCV 124.8 (H) 03/16/2017 0223   MCV 127 (H) 01/11/2017 0938   MCV 110.1 (H) 10/11/2007 0810   MCH 40.5 (H) 03/16/2017 0223   MCHC 32.5 03/16/2017 0223   RDW 14.7 03/16/2017 0223   RDW 13.9 01/11/2017 0938   RDW 13.0 10/11/2007 0810   LYMPHSABS 0.5 (L) 03/16/2017 0223   LYMPHSABS 0.7 (L) 01/11/2017 0938   LYMPHSABS 1.6 10/11/2007 0810   MONOABS 0.5 03/16/2017 0223   MONOABS 0.5 10/11/2007 0810   EOSABS 0.4 03/16/2017 0223   EOSABS 0.1 01/11/2017 0938   BASOSABS 0.0 03/16/2017 0223   BASOSABS 0.0 01/11/2017 0938   BASOSABS 0.0  10/11/2007 0810     Assessment/Plan:  1. AKI, anuric/oliguric in setting of rhabdomyolysis +/- NSAIDs.  Continue with IVF's and change to isotonic bicarb due to metabolic acidosis and rhabdo.  No indication for  dialysis at this time. 1. Starting to show an increase in UOP 2. Continue to follow Scr 2. Metabolic acidosis due to #1 improving with bicarb 3. Rhabdomyolysis- CK's are improving with IVF's.  4. Shock liver- also improving with IVF's.   Donetta Potts, MD [employer] [phone]

## 2017-03-16 NOTE — Progress Notes (Signed)
ANTICOAGULATION CONSULT NOTE - Follow Up Consult  Pharmacy Consult for Heparin Indication: chest pain/ACS  No Known Allergies  Patient Measurements: Height: 5\' 3"  (160 cm) Weight: 154 lb 9.6 oz (70.1 kg) IBW/kg (Calculated) : 52.4 Heparin Dosing Weight: 65.7 kg  Vital Signs: Temp: 98.4 F (36.9 C) (08/17 0800) Temp Source: Oral (08/17 0800) BP: 103/69 (08/17 0927) Pulse Rate: 88 (08/17 0927)  Labs:  Recent Labs  03/13/17 1347  03/14/17 0224 03/14/17 0840 03/15/17 0306 03/16/17 0223  HGB  --   < > 10.0*  --  9.6* 9.0*  HCT  --   < > 30.9*  --  29.7* 27.7*  PLT  --   < > 440*  --  351 338  HEPARINUNFRC  --   < > 0.44  --  0.30 0.21*  CREATININE  --   --   --  3.94* 4.87* 6.21*  CKTOTAL  --   --   --  7,493* 2,990* 1,059*  CKMB  --   --   --  152.2* 79.6* 31.1*  TROPONINI 10.38*  --   --   --  1.92*  --   < > = values in this interval not displayed.  Estimated Creatinine Clearance: 8.5 mL/min (A) (by C-G formula based on SCr of 6.21 mg/dL (H)).  Assessment: 66 yo female admitted 8/13 pm with found down/rhabdo and AKI. Patient with elevated troponins and chest pain; pharmacy consulted to dose heparin. No PTA oral anticoagulation per med rec.   Heparin level subtherapeutic (0.21) after rate increase yesterday. No bleeding or infusion problems reported. Continue heparin drip as cardiology is anticipating procedure once renal function improves.    Goal of Therapy:  Heparin level 0.3-0.7 units/ml Monitor platelets by anticoagulation protocol: Yes   Plan:   Increase heparin drip to 1200 units/hr.  Daily heparin level and CBC while on heparin.  Will follow up anticoagulation plans.  Dawayne Cirri, [place] Pager: [phone] 03/16/2017,10:10 AM
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
## 2017-03-16 NOTE — Progress Notes (Signed)
Physical Therapy Wound Treatment Patient Details  Name: Rachael Johnson MRN: [id number] Date of Birth: August 25, 1950  Today's Date: 03/16/2017 Time: 5271-2929 Time Calculation (min): 30 min  Subjective  Subjective: I hurts bad  Pain Score: Pain Score: 8  Wound Assessment  Wound / Incision (Open or Dehisced) 03/13/17 Non-pressure wound Shoulder Right;Posterior (Active)  Dressing Type ABD;Gauze (Comment);Moist to dry 03/16/2017 11:08 AM  Dressing Changed Changed 03/16/2017 11:08 AM  Dressing Status Clean;Dry;Intact 03/16/2017 11:08 AM  Dressing Change Frequency Daily 03/16/2017 11:08 AM  Site / Wound Assessment Brown;Yellow;Pink 03/16/2017 11:08 AM  % Wound base Red or Granulating 40% 03/16/2017 11:08 AM  % Wound base Yellow/Fibrinous Exudate 60% 03/16/2017 11:08 AM  Peri-wound Assessment Erythema (blanchable) 03/16/2017 11:08 AM  Wound Length (cm) 8 cm 03/15/2017 12:44 PM  Wound Width (cm) 10 cm 03/15/2017 12:44 PM  Wound Depth (cm) 0 cm 03/15/2017 12:44 PM  Margins Unattached edges (unapproximated) 03/16/2017 11:08 AM  Closure None 03/16/2017 11:08 AM  Drainage Amount Scant 03/16/2017 11:08 AM  Drainage Description Serous 03/16/2017 11:08 AM  Non-staged Wound Description Partial thickness 03/14/2017  9:45 AM  Treatment Cleansed;Debridement (Selective);Hydrotherapy (Pulse lavage);Packing (Saline gauze) 03/16/2017 11:08 AM   Santyl applied to wound bed prior to applying dressing.    Hydrotherapy Pulsed lavage therapy - wound location: R posterior shoulder Pulsed Lavage with Suction (psi): 8 psi Pulsed Lavage with Suction - Normal Saline Used: 1000 mL Pulsed Lavage Tip: Tip with splash shield Selective Debridement Selective Debridement - Location: posterior shoulder Selective Debridement - Tools Used: Forceps;Scalpel Selective Debridement - Tissue Removed: scored adherent yellow eschar   Wound Assessment and Plan  Wound Therapy - Assess/Plan/Recommendations Wound Therapy - Clinical Statement: pt  can benefit from PLS to soften tenacious yellow eschar for selective debridement.  Expect wound to be relatively superficial. Wound Therapy - Functional Problem List: pt usually functional and independent Hydrotherapy Plan: Debridement;Dressing change;Patient/family education Wound Therapy - Frequency: 6X / week Wound Therapy - Current Recommendations: PT Wound Therapy - Follow Up Recommendations: Home health RN Wound Plan: see above  Wound Therapy Goals- Improve the function of patient's integumentary system by progressing the wound(s) through the phases of wound healing (inflammation - proliferation - remodeling) by: Decrease Necrotic Tissue to: 25% Decrease Necrotic Tissue - Progress: Progressing toward goal Increase Granulation Tissue to: 75% Increase Granulation Tissue - Progress: Progressing toward goal Goals/treatment plan/discharge plan were made with and agreed upon by patient/family: Yes Time For Goal Achievement: 7 days Wound Therapy - Potential for Goals: Good  Goals will be updated until maximal potential achieved or discharge criteria met.  Discharge criteria: when goals achieved, discharge from hospital, MD decision/surgical intervention, no progress towards goals, refusal/missing three consecutive treatments without notification or medical reason.  GP     Rachael Johnson 03/16/2017, 11:10 AM  03/16/2017  [name], [place] [phone]  (pager)

## 2017-03-16 NOTE — Evaluation (Signed)
Physical Therapy Evaluation Patient Details Name: Rachael Johnson MRN: [id number] DOB: 10/14/1950 Today's Date: 03/16/2017   History of Present Illness  pt is a 66 y/o female with pmh significant for depession, anxiety, HTN, seizures, macrocytic anemia, RLS, admitted after being found down in her home (suspected on floor as much as 24 hours) sustaining multiple abrasions.  Work up includes acute rhabdomyolysis, AKI, hypernatremia due to dehydration.  Clinical Impression  Pt admitted with/for complications stated above as a result of fall and unable to get up from a confined overly cluttered area.  Pt presently at a min assist level..  Pt currently limited functionally due to the problems listed below.  (see problems list.)  Pt will benefit from PT to maximize function and safety to be able to get home safely with available assist.     Follow Up Recommendations Home health PT;Other (comment) (maybe no follow up if rhabdo resolves quickly)    Equipment Recommendations  None recommended by PT    Recommendations for Other Services       Precautions / Restrictions Precautions Precautions: Fall      Mobility  Bed Mobility Overal bed mobility: Needs Assistance Bed Mobility: Supine to Sit     Supine to sit: Min assist     General bed mobility comments: assist to come forward, HOB elevated  Transfers Overall transfer level: Needs assistance Equipment used: Rolling walker (2 wheeled) Transfers: Sit to/from Stand Sit to Stand: Min assist         General transfer comment: cues for hand placement  Ambulation/Gait Ambulation/Gait assistance: Min assist           General Gait Details: generally a long pivot forward and back to the chair with short guarded, almost antalgic steps.  Stairs            Wheelchair Mobility    Modified Rankin (Stroke Patients Only)       Balance Overall balance assessment: Needs assistance Sitting-balance support: Single extremity  supported;No upper extremity supported Sitting balance-Leahy Scale: Fair     Standing balance support: Single extremity supported Standing balance-Leahy Scale: Poor Standing balance comment: needed external support today                             Pertinent Vitals/Pain Pain Assessment: 0-10 Pain Score: 8  Pain Location: R shoulder,  multiple other places Pain Descriptors / Indicators: Burning;Grimacing;Guarding Pain Intervention(s): Limited activity within patient's tolerance;Premedicated before session    Home Living Family/patient expects to be discharged to:: Private residence Living Arrangements: Alone Available Help at Discharge: Family;Available PRN/intermittently (pt unsure if family can provide 24/7 temp., but doesn't want) Type of Home: House Home Access: Level entry     Home Layout: One level Home Equipment: Walker - 2 wheels;Cane - single point;Wheelchair - manual      Prior Function Level of Independence: Independent         Comments: Did all her ADL's and iADL's per pt.     Hand Dominance        Extremity/Trunk Assessment   Upper Extremity Assessment Upper Extremity Assessment: Defer to OT evaluation    Lower Extremity Assessment Lower Extremity Assessment: Overall WFL for tasks assessed (generally weak hams/hip flexors and sore all over)       Communication   Communication: No difficulties  Cognition Arousal/Alertness: Awake/alert Behavior During Therapy: WFL for tasks assessed/performed Overall Cognitive Status: Within Functional Limits for tasks assessed  General Comments      Exercises     Assessment/Plan    PT Assessment Patient needs continued PT services  PT Problem List Decreased strength;Decreased activity tolerance;Decreased mobility;Cardiopulmonary status limiting activity;Pain;Decreased balance       PT Treatment Interventions Gait training;Stair  training;Functional mobility training;Therapeutic activities;Balance training;Patient/family education    PT Goals (Current goals can be found in the Care Plan section)  Acute Rehab PT Goals Patient Stated Goal: home independent PT Goal Formulation: With patient Time For Goal Achievement: 03/30/17 Potential to Achieve Goals: Good    Frequency Min 3X/week   Barriers to discharge        Co-evaluation               AM-PAC PT "6 Clicks" Daily Activity  Outcome Measure Difficulty turning over in bed (including adjusting bedclothes, sheets and blankets)?: Unable Difficulty moving from lying on back to sitting on the side of the bed? : Unable Difficulty sitting down on and standing up from a chair with arms (e.g., wheelchair, bedside commode, etc,.)?: Unable Help needed moving to and from a bed to chair (including a wheelchair)?: A Little Help needed walking in hospital room?: A Little Help needed climbing 3-5 steps with a railing? : A Little 6 Click Score: 12    End of Session   Activity Tolerance: Patient tolerated treatment well Patient left: in chair;with call bell/phone within reach;with chair alarm set Nurse Communication: Mobility status PT Visit Diagnosis: Unsteadiness on feet (R26.81);Muscle weakness (generalized) (M62.81);Pain Pain - Right/Left:  (bil) Pain - part of body:  (shoulders, back)    Time: 1038-1100 PT Time Calculation (min) (ACUTE ONLY): 22 min   Charges:   PT Evaluation $PT Eval Low Complexity: 1 Low     PT G Codes:        19-Mar-2017  [name], PT [phone]  (pager)  Rachael Johnson 19-Mar-2017, 6:04 PM

## 2017-03-16 NOTE — Care Management Important Message (Signed)
Important Message  Patient Details  Name: Rachael Johnson MRN: [id number] Date of Birth: Sep 08, 1950   Medicare Important Message Given:  Yes    Aundrea Horace Abena 03/16/2017, 11:07 AM

## 2017-03-17 LAB — CBC WITH DIFFERENTIAL/PLATELET
Basophils Absolute: 0 10*3/uL (ref 0.0–0.1)
Basophils Relative: 0 %
Eosinophils Absolute: 0.4 10*3/uL (ref 0.0–0.7)
Eosinophils Relative: 5 %
HCT: 28.7 % — ABNORMAL LOW (ref 36.0–46.0)
Hemoglobin: 9.4 g/dL — ABNORMAL LOW (ref 12.0–15.0)
Lymphocytes Relative: 7 %
Lymphs Abs: 0.5 10*3/uL — ABNORMAL LOW (ref 0.7–4.0)
MCH: 41 pg — ABNORMAL HIGH (ref 26.0–34.0)
MCHC: 32.8 g/dL (ref 30.0–36.0)
MCV: 125.3 fL — ABNORMAL HIGH (ref 78.0–100.0)
Monocytes Absolute: 0.4 10*3/uL (ref 0.1–1.0)
Monocytes Relative: 6 %
Neutro Abs: 6.1 10*3/uL (ref 1.7–7.7)
Neutrophils Relative %: 82 %
Platelets: 361 10*3/uL (ref 150–400)
RBC: 2.29 MIL/uL — ABNORMAL LOW (ref 3.87–5.11)
RDW: 15.4 % (ref 11.5–15.5)
WBC: 7.4 10*3/uL (ref 4.0–10.5)

## 2017-03-17 LAB — COMPREHENSIVE METABOLIC PANEL
ALT: 99 U/L — ABNORMAL HIGH (ref 14–54)
AST: 81 U/L — ABNORMAL HIGH (ref 15–41)
Albumin: 2.4 g/dL — ABNORMAL LOW (ref 3.5–5.0)
Alkaline Phosphatase: 85 U/L (ref 38–126)
Anion gap: 16 — ABNORMAL HIGH (ref 5–15)
BUN: 96 mg/dL — ABNORMAL HIGH (ref 6–20)
CO2: 24 mmol/L (ref 22–32)
Calcium: 7.8 mg/dL — ABNORMAL LOW (ref 8.9–10.3)
Chloride: 98 mmol/L — ABNORMAL LOW (ref 101–111)
Creatinine, Ser: 6.75 mg/dL — ABNORMAL HIGH (ref 0.44–1.00)
GFR calc Af Amer: 7 mL/min — ABNORMAL LOW (ref >60)
GFR calc non Af Amer: 6 mL/min — ABNORMAL LOW (ref >60)
Glucose, Bld: 95 mg/dL (ref 65–99)
Potassium: 4.1 mmol/L (ref 3.5–5.1)
Sodium: 138 mmol/L (ref 135–145)
Total Bilirubin: 0.6 mg/dL (ref 0.3–1.2)
Total Protein: 5.6 g/dL — ABNORMAL LOW (ref 6.5–8.1)

## 2017-03-17 LAB — PHOSPHORUS: Phosphorus: 6.1 mg/dL — ABNORMAL HIGH (ref 2.5–4.6)

## 2017-03-17 LAB — MAGNESIUM: Magnesium: 1.8 mg/dL (ref 1.7–2.4)

## 2017-03-17 LAB — CK TOTAL AND CKMB (NOT AT ARMC)
CK, MB: 17.1 ng/mL — ABNORMAL HIGH (ref 0.5–5.0)
Relative Index: 3.5 — ABNORMAL HIGH (ref 0.0–2.5)
Total CK: 482 U/L — ABNORMAL HIGH (ref 38–234)

## 2017-03-17 LAB — TROPONIN I: Troponin I: 0.51 ng/mL (ref <0.03)

## 2017-03-17 MED ORDER — HEPARIN SODIUM (PORCINE) 5000 UNIT/ML IJ SOLN
5000.0000 [IU] | Freq: Three times a day (TID) | INTRAMUSCULAR | Status: DC
  Administered 2017-03-17 – 2017-03-23 (×18): 5000 [IU] via SUBCUTANEOUS
  Filled 2017-03-17 (×18): qty 1

## 2017-03-17 NOTE — Progress Notes (Addendum)
Physical Therapy Wound Treatment Patient Details  Name: HEATHER MCKENDREE MRN: [id number] Date of Birth: 1950-10-26  Today's Date: 03/17/2017 Time: 0941-7919 Time Calculation (min): 29 min  Subjective  Subjective: Patient c/o increased pain today.  More quiet today.  Pain Score: Pain Score: 10-Worst pain ever  Wound Assessment  Wound / Incision (Open or Dehisced) 03/13/17 Non-pressure wound Shoulder Right;Posterior (Active)  Dressing Type ABD;Gauze (Comment);Moist to dry 03/17/2017  3:26 PM  Dressing Changed New 03/17/2017  3:26 PM  Dressing Status Clean;Dry;Intact 03/17/2017  3:26 PM  Dressing Change Frequency Daily 03/17/2017  3:26 PM  Site / Wound Assessment Brown;Yellow;Pink 03/17/2017  3:26 PM  % Wound base Red or Granulating 40% 03/17/2017  3:26 PM  % Wound base Yellow/Fibrinous Exudate 60% 03/17/2017  3:26 PM  Peri-wound Assessment Erythema (blanchable) 03/17/2017  3:26 PM  Margins Unattached edges (unapproximated) 03/17/2017  3:26 PM  Closure None 03/17/2017  3:26 PM  Drainage Amount Minimal 03/17/2017  3:26 PM  Drainage Description Serosanguineous 03/17/2017  3:26 PM  Non-staged Wound Description Partial thickness 03/17/2017  3:26 PM  Treatment Cleansed;Debridement (Selective);Hydrotherapy (Pulse lavage) 03/17/2017  3:26 PM      Hydrotherapy Pulsed lavage therapy - wound location: R posterior shoulder Pulsed Lavage with Suction (psi): 8 psi Pulsed Lavage with Suction - Normal Saline Used: 1000 mL Pulsed Lavage Tip: Tip with splash shield Selective Debridement Selective Debridement - Location: posterior shoulder Selective Debridement - Tools Used: Forceps;Scalpel Selective Debridement - Tissue Removed: yellow eschar   Wound Assessment and Plan  Wound Therapy - Assess/Plan/Recommendations Wound Therapy - Clinical Statement: pt can benefit from PLS to soften tenacious yellow eschar for selective debridement.  Expect wound to be relatively superficial. Wound Therapy - Functional Problem  List: pt usually functional and independent Hydrotherapy Plan: Debridement;Dressing change;Patient/family education Wound Therapy - Frequency: 6X / week Wound Therapy - Follow Up Recommendations: Home health RN Wound Plan: see above  Wound Therapy Goals- Improve the function of patient's integumentary system by progressing the wound(s) through the phases of wound healing (inflammation - proliferation - remodeling) by: Decrease Necrotic Tissue to: 25% Decrease Necrotic Tissue - Progress: Progressing toward goal Increase Granulation Tissue to: 75% Increase Granulation Tissue - Progress: Progressing toward goal Goals/treatment plan/discharge plan were made with and agreed upon by patient/family: Yes Time For Goal Achievement: 7 days Wound Therapy - Potential for Goals: Good  Goals will be updated until maximal potential achieved or discharge criteria met.  Discharge criteria: when goals achieved, discharge from hospital, MD decision/surgical intervention, no progress towards goals, refusal/missing three consecutive treatments without notification or medical reason.  GP     Despina Pole 03/17/2017, 3:35 PM Carita Pian. Sanjuana Kava, [place] Pager [phone]

## 2017-03-17 NOTE — Progress Notes (Signed)
Patient ID: DREANA BRITZ, female   DOB: 05-11-1951, 66 y.o.   MRN: [id number] S: Feeling better but still sore O:BP 113/67 (BP Location: Left Arm)   Pulse 74   Temp 98.8 F (37.1 C) (Oral)   Resp 14   Ht 5\' 3"  (1.6 m)   Wt 70.2 kg (154 lb 12.8 oz)   LMP 07/31/1990   SpO2 95%   BMI 27.42 kg/m   Intake/Output Summary (Last 24 hours) at 03/17/17 1248 Last data filed at 03/17/17 0618  Gross per 24 hour  Intake          1162.78 ml  Output             1025 ml  Net           137.78 ml   Intake/Output: I/O last 3 completed shifts: In: 2276.1 [P.O.:120; I.V.:2156.1] Out: 1400 [Urine:1400]  Intake/Output this shift:  No intake/output data recorded. Weight change: 0.091 kg (3.2 oz) Gen:NAD CVS: no rub Resp:cta LGX:QJJHER Ext: no pitting edema   Recent Labs Lab 03/12/17 2320 03/12/17 2352 03/13/17 0720 03/14/17 0840 03/15/17 0306 03/16/17 0223 03/17/17 0359  NA 146* 147* 141 140 138 138 138  K 3.7 3.8 3.5 3.5 4.1 4.1 4.1  CL 112* 116* 109 107 110 105 98*  CO2 16*  --  21* 21* 17* 22 24  GLUCOSE 117* 110* 153* 131* 86 97 95  BUN 73* 69* 70* 77* 81* 90* 96*  CREATININE 2.97* 2.70* 2.59* 3.94* 4.87* 6.21* 6.75*  ALBUMIN 4.0  --   --  2.9* 2.7* 2.5* 2.4*  CALCIUM 8.7*  --  7.9* 7.6* 7.7* 7.6* 7.8*  PHOS  --   --   --  4.5 5.3* 5.3* 6.1*  AST 876*  --   --  378* 247* 138* 81*  ALT 201*  --   --  172* 149* 120* 99*   Liver Function Tests:  Recent Labs Lab 03/15/17 0306 03/16/17 0223 03/17/17 0359  AST 247* 138* 81*  ALT 149* 120* 99*  ALKPHOS 58 65 85  BILITOT 0.6 0.6 0.6  PROT 5.6* 5.4* 5.6*  ALBUMIN 2.7* 2.5* 2.4*   No results for input(s): LIPASE, AMYLASE in the last 168 hours.  Recent Labs Lab 03/13/17 0231  AMMONIA 35   CBC:  Recent Labs Lab 03/13/17 2046 03/14/17 0224 03/15/17 0306 03/16/17 0223 03/17/17 0359  WBC 10.4 8.9 8.2 7.8 7.4  NEUTROABS  --   --  6.9 6.4 6.1  HGB 11.0* 10.0* 9.6* 9.0* 9.4*  HCT 33.3* 30.9* 29.7* 27.7* 28.7*  MCV  123.8* 124.1* 126.9* 124.8* 125.3*  PLT 554* 440* 351 338 361   Cardiac Enzymes:  Recent Labs Lab 03/12/17 2320  03/13/17 0338 03/13/17 0720 03/13/17 1347 03/14/17 0840 03/15/17 0306 03/16/17 0223 03/17/17 0359  CKTOTAL 32,176*  --   --   --   --  7,493* 2,990* 1,059* 482*  CKMB  --   --   --   --   --  152.2* 79.6* 31.1* 17.1*  TROPONINI 21.50*  < > 16.09* 14.51* 10.38*  --  1.92*  --  0.51*  < > = values in this interval not displayed. CBG:  Recent Labs Lab 03/12/17 2334  GLUCAP 112*    Iron Studies: No results for input(s): IRON, TIBC, TRANSFERRIN, FERRITIN in the last 72 hours. Studies/Results: No results found. Marland Kitchen amitriptyline  20 mg Oral QHS  . aspirin EC  81 mg Oral Daily  . bacitracin  Topical BID  . busPIRone  10 mg Oral TID  . citalopram  20 mg Oral q morning - 10a  . collagenase   Topical Daily  . cycloSPORINE  1 drop Both Eyes BID  . famotidine  20 mg Oral QHS  . fluticasone  2 spray Each Nare Daily  . heparin subcutaneous  5,000 Units Subcutaneous Q8H  . levothyroxine  88 mcg Oral QAC breakfast  . loratadine  10 mg Oral Daily  . mometasone-formoterol  2 puff Inhalation BID  . montelukast  10 mg Oral QHS  . multivitamin   Oral q morning - 10a  . pantoprazole  40 mg Oral BID  . pramipexole  0.25-0.5 mg Oral QHS  . venlafaxine XR  75 mg Oral Q breakfast    BMET    Component Value Date/Time   NA 138 03/17/2017 0359   NA 140 01/11/2017 0938   NA 140 11/29/2016 0936   K 4.1 03/17/2017 0359   K 3.5 01/11/2017 0938   K 4.2 11/29/2016 0936   CL 98 (L) 03/17/2017 0359   CL 103 01/11/2017 0938   CO2 24 03/17/2017 0359   CO2 31 01/11/2017 0938   CO2 27 11/29/2016 0936   GLUCOSE 95 03/17/2017 0359   GLUCOSE 116 01/11/2017 0938   BUN 96 (H) 03/17/2017 0359   BUN 13 01/11/2017 0938   BUN 18.3 11/29/2016 0936   CREATININE 6.75 (H) 03/17/2017 0359   CREATININE 0.9 01/11/2017 0938   CREATININE 0.8 11/29/2016 0936   CALCIUM 7.8 (L) 03/17/2017 0359    CALCIUM 8.7 01/11/2017 0938   CALCIUM 8.9 11/29/2016 0936   GFRNONAA 6 (L) 03/17/2017 0359   GFRAA 7 (L) 03/17/2017 0359   CBC    Component Value Date/Time   WBC 7.4 03/17/2017 0359   RBC 2.29 (L) 03/17/2017 0359   HGB 9.4 (L) 03/17/2017 0359   HGB 10.8 (L) 01/11/2017 0938   HGB 13.9 10/11/2007 0810   HCT 28.7 (L) 03/17/2017 0359   HCT 35.1 03/13/2017 0338   HCT 32.5 (L) 01/11/2017 0938   HCT 40.6 10/11/2007 0810   PLT 361 03/17/2017 0359   PLT 286 01/11/2017 0938   PLT 502 (H) 10/11/2007 0810   MCV 125.3 (H) 03/17/2017 0359   MCV 127 (H) 01/11/2017 0938   MCV 110.1 (H) 10/11/2007 0810   MCH 41.0 (H) 03/17/2017 0359   MCHC 32.8 03/17/2017 0359   RDW 15.4 03/17/2017 0359   RDW 13.9 01/11/2017 0938   RDW 13.0 10/11/2007 0810   LYMPHSABS 0.5 (L) 03/17/2017 0359   LYMPHSABS 0.7 (L) 01/11/2017 0938   LYMPHSABS 1.6 10/11/2007 0810   MONOABS 0.4 03/17/2017 0359   MONOABS 0.5 10/11/2007 0810   EOSABS 0.4 03/17/2017 0359   EOSABS 0.1 01/11/2017 0938   BASOSABS 0.0 03/17/2017 0359   BASOSABS 0.0 01/11/2017 0938   BASOSABS 0.0 10/11/2007 0810     Assessment/Plan:  1. AKI, anuric/oliguric in setting of rhabdomyolysis +/- NSAIDs. Continue with IVF's and change to isotonic bicarb due to metabolic acidosis and rhabdo. 1. Starting to show an increase in UOP 2. Continue to follow Scr 3. No indication for dialysis at this time. 2. Metabolic acidosis due to #1 improving with bicarb 3. Rhabdomyolysis- CK's are improving with IVF's.  4. Shock liver- also improving with IVF's.  5. Anemia- presumably due to acute illness  Donetta Potts, MD [facility] [phone]
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
## 2017-03-18 LAB — COMPREHENSIVE METABOLIC PANEL
ALT: 72 U/L — ABNORMAL HIGH (ref 14–54)
AST: 49 U/L — ABNORMAL HIGH (ref 15–41)
Albumin: 2.1 g/dL — ABNORMAL LOW (ref 3.5–5.0)
Alkaline Phosphatase: 84 U/L (ref 38–126)
Anion gap: 13 (ref 5–15)
BUN: 98 mg/dL — ABNORMAL HIGH (ref 6–20)
CO2: 30 mmol/L (ref 22–32)
Calcium: 7.5 mg/dL — ABNORMAL LOW (ref 8.9–10.3)
Chloride: 93 mmol/L — ABNORMAL LOW (ref 101–111)
Creatinine, Ser: 7.18 mg/dL — ABNORMAL HIGH (ref 0.44–1.00)
GFR calc Af Amer: 6 mL/min — ABNORMAL LOW (ref >60)
GFR calc non Af Amer: 5 mL/min — ABNORMAL LOW (ref >60)
Glucose, Bld: 125 mg/dL — ABNORMAL HIGH (ref 65–99)
Potassium: 3.7 mmol/L (ref 3.5–5.1)
Sodium: 136 mmol/L (ref 135–145)
Total Bilirubin: 0.5 mg/dL (ref 0.3–1.2)
Total Protein: 5 g/dL — ABNORMAL LOW (ref 6.5–8.1)

## 2017-03-18 LAB — HEMOGLOBIN AND HEMATOCRIT, BLOOD
HCT: 32.5 % — ABNORMAL LOW (ref 36.0–46.0)
Hemoglobin: 10.9 g/dL — ABNORMAL LOW (ref 12.0–15.0)

## 2017-03-18 LAB — CBC WITH DIFFERENTIAL/PLATELET
Basophils Absolute: 0 10*3/uL (ref 0.0–0.1)
Basophils Relative: 0 %
Eosinophils Absolute: 0.3 10*3/uL (ref 0.0–0.7)
Eosinophils Relative: 4 %
HCT: 23.1 % — ABNORMAL LOW (ref 36.0–46.0)
Hemoglobin: 7.7 g/dL — ABNORMAL LOW (ref 12.0–15.0)
Lymphocytes Relative: 9 %
Lymphs Abs: 0.7 10*3/uL (ref 0.7–4.0)
MCH: 40.7 pg — ABNORMAL HIGH (ref 26.0–34.0)
MCHC: 33.3 g/dL (ref 30.0–36.0)
MCV: 122.2 fL — ABNORMAL HIGH (ref 78.0–100.0)
Monocytes Absolute: 0.5 10*3/uL (ref 0.1–1.0)
Monocytes Relative: 6 %
Neutro Abs: 6.1 10*3/uL (ref 1.7–7.7)
Neutrophils Relative %: 81 %
Platelets: 395 10*3/uL (ref 150–400)
RBC: 1.89 MIL/uL — ABNORMAL LOW (ref 3.87–5.11)
RDW: 15 % (ref 11.5–15.5)
WBC: 7.6 10*3/uL (ref 4.0–10.5)

## 2017-03-18 LAB — ABO/RH: ABO/RH(D): O POS

## 2017-03-18 LAB — CK TOTAL AND CKMB (NOT AT ARMC)
CK, MB: 16.1 ng/mL — ABNORMAL HIGH (ref 0.5–5.0)
Relative Index: 6.9 — ABNORMAL HIGH (ref 0.0–2.5)
Total CK: 232 U/L (ref 38–234)

## 2017-03-18 LAB — MAGNESIUM: Magnesium: 1.6 mg/dL — ABNORMAL LOW (ref 1.7–2.4)

## 2017-03-18 LAB — PHOSPHORUS: Phosphorus: 5.9 mg/dL — ABNORMAL HIGH (ref 2.5–4.6)

## 2017-03-18 LAB — PREPARE RBC (CROSSMATCH)

## 2017-03-18 MED ORDER — SODIUM CHLORIDE 0.9 % IV SOLN: Freq: Once | INTRAVENOUS | Status: DC

## 2017-03-18 MED ORDER — SODIUM CHLORIDE 0.9 % IV SOLN
INTRAVENOUS | Status: DC
  Administered 2017-03-18: 20:00:00 via INTRAVENOUS

## 2017-03-18 MED ORDER — SODIUM CHLORIDE 0.9 % IV SOLN
Freq: Once | INTRAVENOUS | Status: AC
  Administered 2017-03-18: 13:00:00 via INTRAVENOUS

## 2017-03-18 MED ORDER — FUROSEMIDE 10 MG/ML IJ SOLN
20.0000 mg | Freq: Once | INTRAMUSCULAR | Status: AC
  Administered 2017-03-18: 20 mg via INTRAVENOUS
  Filled 2017-03-18: qty 2

## 2017-03-18 MED ORDER — MAGNESIUM SULFATE IN D5W 1-5 GM/100ML-% IV SOLN
1.0000 g | Freq: Once | INTRAVENOUS | Status: AC
  Administered 2017-03-18: 1 g via INTRAVENOUS
  Filled 2017-03-18: qty 100
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
## 2017-03-18 NOTE — Progress Notes (Addendum)
Patient ID: Rachael Johnson, female   DOB: 12-13-1950, 66 y.o.   MRN: [id number] S:Up walking with nursing staff O:BP 127/70   Pulse 88   Temp 99 F (37.2 C) (Oral)   Resp 14   Ht 5\' 3"  (1.6 m)   Wt 65.7 kg (144 lb 13.5 oz)   LMP 07/31/1990   SpO2 93%   BMI 25.66 kg/m   Intake/Output Summary (Last 24 hours) at 03/18/17 1350 Last data filed at 03/18/17 1300  Gross per 24 hour  Intake              290 ml  Output              800 ml  Net             -510 ml   Intake/Output: I/O last 3 completed shifts: In: -  Out: 5053 [Urine:1825]  Intake/Output this shift:  Total I/O In: 290 [P.O.:240; I.V.:10; Blood:40] Out: -  Weight change: -4.517 kg (-9 lb 15.3 oz) Gen:NAD CVS: no rub Resp:cta ZJQ:BHALPF XTK:WIOXB-3+ edema lower ext   Recent Labs Lab 03/12/17 2320 03/12/17 2352 03/13/17 0720 03/14/17 0840 03/15/17 0306 03/16/17 0223 03/17/17 0359 03/18/17 0236  NA 146* 147* 141 140 138 138 138 136  K 3.7 3.8 3.5 3.5 4.1 4.1 4.1 3.7  CL 112* 116* 109 107 110 105 98* 93*  CO2 16*  --  21* 21* 17* 22 24 30   GLUCOSE 117* 110* 153* 131* 86 97 95 125*  BUN 73* 69* 70* 77* 81* 90* 96* 98*  CREATININE 2.97* 2.70* 2.59* 3.94* 4.87* 6.21* 6.75* 7.18*  ALBUMIN 4.0  --   --  2.9* 2.7* 2.5* 2.4* 2.1*  CALCIUM 8.7*  --  7.9* 7.6* 7.7* 7.6* 7.8* 7.5*  PHOS  --   --   --  4.5 5.3* 5.3* 6.1* 5.9*  AST 876*  --   --  378* 247* 138* 81* 49*  ALT 201*  --   --  172* 149* 120* 99* 72*   Liver Function Tests:  Recent Labs Lab 03/16/17 0223 03/17/17 0359 03/18/17 0236  AST 138* 81* 49*  ALT 120* 99* 72*  ALKPHOS 65 85 84  BILITOT 0.6 0.6 0.5  PROT 5.4* 5.6* 5.0*  ALBUMIN 2.5* 2.4* 2.1*   No results for input(s): LIPASE, AMYLASE in the last 168 hours.  Recent Labs Lab 03/13/17 0231  AMMONIA 35   CBC:  Recent Labs Lab 03/14/17 0224  03/15/17 0306 03/16/17 0223 03/17/17 0359 03/18/17 0236  WBC 8.9  --  8.2 7.8 7.4 7.6  NEUTROABS  --   < > 6.9 6.4 6.1 6.1  HGB 10.0*   --  9.6* 9.0* 9.4* 7.7*  HCT 30.9*  --  29.7* 27.7* 28.7* 23.1*  MCV 124.1*  --  126.9* 124.8* 125.3* 122.2*  PLT 440*  --  351 338 361 395  < > = values in this interval not displayed. Cardiac Enzymes:  Recent Labs Lab 03/13/17 0338 03/13/17 0720 03/13/17 1347 03/14/17 0840 03/15/17 0306 03/16/17 0223 03/17/17 0359 03/18/17 0950  CKTOTAL  --   --   --  7,493* 2,990* 1,059* 482* 232  CKMB  --   --   --  152.2* 79.6* 31.1* 17.1* 16.1*  TROPONINI 16.09* 14.51* 10.38*  --  1.92*  --  0.51*  --    CBG:  Recent Labs Lab 03/12/17 2334  GLUCAP 112*    Iron Studies: No results for input(s): IRON, TIBC,  TRANSFERRIN, FERRITIN in the last 72 hours. Studies/Results: No results found. Marland Kitchen amitriptyline  20 mg Oral QHS  . aspirin EC  81 mg Oral Daily  . bacitracin   Topical BID  . busPIRone  10 mg Oral TID  . citalopram  20 mg Oral q morning - 10a  . collagenase   Topical Daily  . cycloSPORINE  1 drop Both Eyes BID  . famotidine  20 mg Oral QHS  . fluticasone  2 spray Each Nare Daily  . heparin subcutaneous  5,000 Units Subcutaneous Q8H  . levothyroxine  88 mcg Oral QAC breakfast  . loratadine  10 mg Oral Daily  . mometasone-formoterol  2 puff Inhalation BID  . montelukast  10 mg Oral QHS  . multivitamin   Oral q morning - 10a  . pantoprazole  40 mg Oral BID  . pramipexole  0.25-0.5 mg Oral QHS  . venlafaxine XR  75 mg Oral Q breakfast    BMET    Component Value Date/Time   NA 136 03/18/2017 0236   NA 140 01/11/2017 0938   NA 140 11/29/2016 0936   K 3.7 03/18/2017 0236   K 3.5 01/11/2017 0938   K 4.2 11/29/2016 0936   CL 93 (L) 03/18/2017 0236   CL 103 01/11/2017 0938   CO2 30 03/18/2017 0236   CO2 31 01/11/2017 0938   CO2 27 11/29/2016 0936   GLUCOSE 125 (H) 03/18/2017 0236   GLUCOSE 116 01/11/2017 0938   BUN 98 (H) 03/18/2017 0236   BUN 13 01/11/2017 0938   BUN 18.3 11/29/2016 0936   CREATININE 7.18 (H) 03/18/2017 0236   CREATININE 0.9 01/11/2017 0938    CREATININE 0.8 11/29/2016 0936   CALCIUM 7.5 (L) 03/18/2017 0236   CALCIUM 8.7 01/11/2017 0938   CALCIUM 8.9 11/29/2016 0936   GFRNONAA 5 (L) 03/18/2017 0236   GFRAA 6 (L) 03/18/2017 0236   CBC    Component Value Date/Time   WBC 7.6 03/18/2017 0236   RBC 1.89 (L) 03/18/2017 0236   HGB 7.7 (L) 03/18/2017 0236   HGB 10.8 (L) 01/11/2017 0938   HGB 13.9 10/11/2007 0810   HCT 23.1 (L) 03/18/2017 0236   HCT 35.1 03/13/2017 0338   HCT 32.5 (L) 01/11/2017 0938   HCT 40.6 10/11/2007 0810   PLT 395 03/18/2017 0236   PLT 286 01/11/2017 0938   PLT 502 (H) 10/11/2007 0810   MCV 122.2 (H) 03/18/2017 0236   MCV 127 (H) 01/11/2017 0938   MCV 110.1 (H) 10/11/2007 0810   MCH 40.7 (H) 03/18/2017 0236   MCHC 33.3 03/18/2017 0236   RDW 15.0 03/18/2017 0236   RDW 13.9 01/11/2017 0938   RDW 13.0 10/11/2007 0810   LYMPHSABS 0.7 03/18/2017 0236   LYMPHSABS 0.7 (L) 01/11/2017 0938   LYMPHSABS 1.6 10/11/2007 0810   MONOABS 0.5 03/18/2017 0236   MONOABS 0.5 10/11/2007 0810   EOSABS 0.3 03/18/2017 0236   EOSABS 0.1 01/11/2017 0938   BASOSABS 0.0 03/18/2017 0236   BASOSABS 0.0 01/11/2017 0938   BASOSABS 0.0 10/11/2007 0810    Assessment/Plan:  1. AKI, anuric/oliguric in setting of rhabdomyolysis +/- NSAIDs. Continue with IVF's and change to isotonic bicarb due to metabolic acidosis and rhabdo. 1. Starting to show an increase in UOP 2. Continue to follow Scr 3. No indication for dialysis at this time. 4. Poor po intake and may need to restart IVF's given increased UOP 2. Metabolic acidosis due to #1 improving with bicarb 3. Rhabdomyolysis- CK's  are improving with IVF's.  4. Shock liver- also improving with IVF's.  5. Anemia- presumably due to acute illness, now with an acute drop.. Repeat and transfuse prn.  Donetta Potts, MD [employer] [phone]

## 2017-03-18 NOTE — Progress Notes (Signed)
Notified MD to inform of drop in HGB from 9.4 to 7.7. MD also made aware of patient complaining of severe headache rating 9/10 with no relief from tylenol and last CT performed on 9/14. Neuro check negative, VSS and patient currently asymptomatic. New orders given, will continue to monitor.

## 2017-03-18 NOTE — Evaluation (Signed)
Occupational Therapy Evaluation Patient Details Name: Rachael Johnson MRN: [id number] DOB: 05-Jan-1951 Today's Date: 03/18/2017    History of Present Illness pt is a 66 y/o female with pmh significant for depession, anxiety, HTN, seizures, macrocytic anemia, RLS, admitted after being found down in her home (suspected on floor as much as 24 hours) sustaining multiple abrasions.  Work up includes acute rhabdomyolysis, AKI, hypernatremia due to dehydration.   Clinical Impression   Pt reports she was independent with ADL PTA. Currently pt required min assist for sit to stand from EOB with few side steps for repositioning, min assist for UB ADL, and mod assist for LB ADL. Pt planning to d/c home alone with intermittent supervision from family. Recommending HHOT for follow up to maximize independence and safety with ADL and functional mobility. Pt would benefit from continued skilled OT to address established goals.    Follow Up Recommendations  Home health OT;Supervision/Assistance - 24 hour    Equipment Recommendations  3 in 1 bedside commode    Recommendations for Other Services       Precautions / Restrictions Precautions Precautions: Fall Restrictions Weight Bearing Restrictions: No      Mobility Bed Mobility Overal bed mobility: Needs Assistance Bed Mobility: Supine to Sit;Sit to Supine     Supine to sit: Min assist Sit to supine: Min assist   General bed mobility comments: HHA for trunk elevation. Assist for LEs to return to bed  Transfers Overall transfer level: Needs assistance Equipment used: Rolling walker (2 wheeled) Transfers: Sit to/from Stand Sit to Stand: Min assist         General transfer comment: cues for hand placement. min assist to steady    Balance Overall balance assessment: Needs assistance Sitting-balance support: Feet unsupported;No upper extremity supported Sitting balance-Leahy Scale: Good     Standing balance support: Bilateral upper  extremity supported Standing balance-Leahy Scale: Poor                             ADL either performed or assessed with clinical judgement   ADL Overall ADL's : Needs assistance/impaired Eating/Feeding: Set up;Sitting   Grooming: Supervision/safety;Set up;Sitting   Upper Body Bathing: Minimal assistance;Sitting   Lower Body Bathing: Moderate assistance;Sit to/from stand   Upper Body Dressing : Min guard;Sitting   Lower Body Dressing: Moderate assistance;Sit to/from stand                 General ADL Comments: Limited mobility due to pain and fatigue.     Vision         Perception     Praxis      Pertinent Vitals/Pain Pain Assessment: Faces Faces Pain Scale: Hurts whole lot Pain Location: "all over" Pain Descriptors / Indicators: Aching;Grimacing;Guarding Pain Intervention(s): Limited activity within patient's tolerance;Monitored during session     Hand Dominance     Extremity/Trunk Assessment Upper Extremity Assessment Upper Extremity Assessment: Overall WFL for tasks assessed   Lower Extremity Assessment Lower Extremity Assessment: Defer to PT evaluation   Cervical / Trunk Assessment Cervical / Trunk Assessment: Kyphotic   Communication Communication Communication: No difficulties   Cognition Arousal/Alertness: Awake/alert Behavior During Therapy: WFL for tasks assessed/performed Overall Cognitive Status: Within Functional Limits for tasks assessed                                     General  Comments       Exercises     Shoulder Instructions      Home Living Family/patient expects to be discharged to:: Private residence Living Arrangements: Alone Available Help at Discharge: Family;Available PRN/intermittently Type of Home: House Home Access: Level entry     Home Layout: One level     Bathroom Shower/Tub: Tub/shower unit;Curtain   Biochemist, clinical: Standard     Home Equipment: Environmental consultant - 2 wheels;Cane -  single point;Wheelchair - manual;Shower seat          Prior Functioning/Environment Level of Independence: Independent                 OT Problem List: Decreased strength;Decreased activity tolerance;Impaired balance (sitting and/or standing);Decreased knowledge of use of DME or AE;Pain      OT Treatment/Interventions: Self-care/ADL training;Therapeutic exercise;Energy conservation;DME and/or AE instruction;Therapeutic activities;Patient/family education;Balance training    OT Goals(Current goals can be found in the care plan section) Acute Rehab OT Goals Patient Stated Goal: home independent OT Goal Formulation: With patient Time For Goal Achievement: 04/01/17 Potential to Achieve Goals: Good ADL Goals Pt Will Perform Grooming: with supervision;standing Pt Will Perform Upper Body Bathing: with set-up;sitting Pt Will Perform Lower Body Bathing: with supervision;sit to/from stand Pt Will Transfer to Toilet: with supervision;ambulating;bedside commode Pt Will Perform Toileting - Clothing Manipulation and hygiene: with supervision;sit to/from stand Pt Will Perform Tub/Shower Transfer: Tub transfer;with supervision;ambulating;shower seat;rolling walker  OT Frequency: Min 2X/week   Barriers to D/C: Decreased caregiver support  pt lives alone       Co-evaluation              AM-PAC PT "6 Clicks" Daily Activity     Outcome Measure Help from another person eating meals?: None Help from another person taking care of personal grooming?: A Little Help from another person toileting, which includes using toliet, bedpan, or urinal?: A Little Help from another person bathing (including washing, rinsing, drying)?: A Lot Help from another person to put on and taking off regular upper body clothing?: A Little Help from another person to put on and taking off regular lower body clothing?: A Lot 6 Click Score: 17   End of Session Equipment Utilized During Treatment: Rolling  walker Nurse Communication: Mobility status  Activity Tolerance: Patient limited by fatigue;Patient limited by pain Patient left: in bed;with call bell/phone within reach  OT Visit Diagnosis: Unsteadiness on feet (R26.81);Muscle weakness (generalized) (M62.81);Pain                Time: 9179-1505 OT Time Calculation (min): 17 min Charges:  OT General Charges $OT Visit: 1 Procedure OT Evaluation $OT Eval Moderate Complexity: 1 Procedure G-Codes:     Jazline Cumbee A. Ulice Brilliant, M.S., OTR/L Pager: [place] 03/18/2017, 2:34 PM

## 2017-03-19 LAB — COMPREHENSIVE METABOLIC PANEL
ALT: 55 U/L — ABNORMAL HIGH (ref 14–54)
AST: 34 U/L (ref 15–41)
Albumin: 2.1 g/dL — ABNORMAL LOW (ref 3.5–5.0)
Alkaline Phosphatase: 90 U/L (ref 38–126)
Anion gap: 14 (ref 5–15)
BUN: 99 mg/dL — ABNORMAL HIGH (ref 6–20)
CO2: 30 mmol/L (ref 22–32)
Calcium: 7.4 mg/dL — ABNORMAL LOW (ref 8.9–10.3)
Chloride: 93 mmol/L — ABNORMAL LOW (ref 101–111)
Creatinine, Ser: 7.13 mg/dL — ABNORMAL HIGH (ref 0.44–1.00)
GFR calc Af Amer: 6 mL/min — ABNORMAL LOW (ref >60)
GFR calc non Af Amer: 5 mL/min — ABNORMAL LOW (ref >60)
Glucose, Bld: 94 mg/dL (ref 65–99)
Potassium: 3.7 mmol/L (ref 3.5–5.1)
Sodium: 137 mmol/L (ref 135–145)
Total Bilirubin: 0.6 mg/dL (ref 0.3–1.2)
Total Protein: 5 g/dL — ABNORMAL LOW (ref 6.5–8.1)

## 2017-03-19 LAB — TYPE AND SCREEN
ABO/RH(D): O POS
Antibody Screen: NEGATIVE
Unit division: 0

## 2017-03-19 LAB — BPAM RBC
Blood Product Expiration Date: 201809082359
ISSUE DATE / TIME: 201808190904
Unit Type and Rh: 5100

## 2017-03-19 LAB — CBC WITH DIFFERENTIAL/PLATELET
Basophils Absolute: 0 10*3/uL (ref 0.0–0.1)
Basophils Relative: 0 %
Eosinophils Absolute: 0.3 10*3/uL (ref 0.0–0.7)
Eosinophils Relative: 6 %
HCT: 28.7 % — ABNORMAL LOW (ref 36.0–46.0)
Hemoglobin: 9.7 g/dL — ABNORMAL LOW (ref 12.0–15.0)
Lymphocytes Relative: 12 %
Lymphs Abs: 0.7 10*3/uL (ref 0.7–4.0)
MCH: 38.6 pg — ABNORMAL HIGH (ref 26.0–34.0)
MCHC: 33.8 g/dL (ref 30.0–36.0)
MCV: 114.3 fL — ABNORMAL HIGH (ref 78.0–100.0)
Monocytes Absolute: 0.4 10*3/uL (ref 0.1–1.0)
Monocytes Relative: 7 %
Neutro Abs: 4.2 10*3/uL (ref 1.7–7.7)
Neutrophils Relative %: 75 %
Platelets: 317 10*3/uL (ref 150–400)
RBC: 2.51 MIL/uL — ABNORMAL LOW (ref 3.87–5.11)
RDW: 20.4 % — ABNORMAL HIGH (ref 11.5–15.5)
WBC: 5.6 10*3/uL (ref 4.0–10.5)

## 2017-03-19 LAB — MAGNESIUM: Magnesium: 1.9 mg/dL (ref 1.7–2.4)

## 2017-03-19 LAB — CK TOTAL AND CKMB (NOT AT ARMC)
CK, MB: 6.2 ng/mL — ABNORMAL HIGH (ref 0.5–5.0)
Relative Index: 3.9 — ABNORMAL HIGH (ref 0.0–2.5)
Total CK: 158 U/L (ref 38–234)

## 2017-03-19 LAB — PHOSPHORUS: Phosphorus: 6.6 mg/dL — ABNORMAL HIGH (ref 2.5–4.6)

## 2017-03-19 LAB — TROPONIN I: Troponin I: 0.18 ng/mL (ref <0.03)

## 2017-03-19 NOTE — Progress Notes (Signed)
Physical Therapy Treatment Patient Details Name: Rachael Johnson MRN: [id number] DOB: 11-15-1950 Today's Date: 03/19/2017    History of Present Illness pt is a 66 y/o female with pmh significant for depession, anxiety, HTN, seizures, macrocytic anemia, RLS, admitted after being found down in her home (suspected on floor as much as 24 hours) sustaining multiple abrasions.  Work up includes acute rhabdomyolysis, AKI, hypernatremia due to dehydration.    PT Comments    Pt improving steadily.  She is still rather guarded with movement and may have lost some of her confidence.  She likely will need short-term supervision for first few days.   Follow Up Recommendations  Home health PT;Other (comment);Supervision/Assistance - 24 hour (initially needs someone at home with her.)     Equipment Recommendations  None recommended by PT    Recommendations for Other Services       Precautions / Restrictions Precautions Precautions: Fall    Mobility  Bed Mobility Overal bed mobility: Needs Assistance Bed Mobility: Supine to Sit;Sit to Supine     Supine to sit: Min guard;HOB elevated (use of rail, but no assist) Sit to supine: Min guard   General bed mobility comments: guarded, slow, use of rail, but works her way to EOB without assist.  Needed min assist to get positioned up in the bed.  Transfers Overall transfer level: Needs assistance Equipment used: Rolling walker (2 wheeled) Transfers: Sit to/from Stand Sit to Stand: Min guard         General transfer comment: cues for hand placement.   Ambulation/Gait Ambulation/Gait assistance: Min guard Ambulation Distance (Feet): 380 Feet Assistive device: Rolling walker (2 wheeled) Gait Pattern/deviations: Step-through pattern   Gait velocity interpretation: Below normal speed for age/gender General Gait Details: pt stiff, guarded and mildly unsteady at times, but pt able to being speeding up with consistant V/T cues.   Stairs             Wheelchair Mobility    Modified Rankin (Stroke Patients Only)       Balance Overall balance assessment: Needs assistance Sitting-balance support: Feet unsupported;No upper extremity supported Sitting balance-Leahy Scale: Good       Standing balance-Leahy Scale: Fair Standing balance comment: no external support for static standing.  Still needed RW for confidence dynamically                            Cognition Arousal/Alertness: Awake/alert Behavior During Therapy: WFL for tasks assessed/performed Overall Cognitive Status: Within Functional Limits for tasks assessed                                        Exercises      General Comments        Pertinent Vitals/Pain Pain Assessment: Faces Pain Score: 6  Pain Location: shoulders and neck Pain Descriptors / Indicators: Aching;Sore;Guarding Pain Intervention(s): Monitored during session;Premedicated before session    Home Living                      Prior Function            PT Goals (current goals can now be found in the care plan section) Acute Rehab PT Goals Patient Stated Goal: home independent PT Goal Formulation: With patient Time For Goal Achievement: 03/30/17 Potential to Achieve Goals: Good Progress towards PT goals: Progressing  toward goals    Frequency    Min 3X/week      PT Plan Current plan remains appropriate    Co-evaluation              AM-PAC PT "6 Clicks" Daily Activity  Outcome Measure  Difficulty turning over in bed (including adjusting bedclothes, sheets and blankets)?: A Lot Difficulty moving from lying on back to sitting on the side of the bed? : Unable Difficulty sitting down on and standing up from a chair with arms (e.g., wheelchair, bedside commode, etc,.)?: A Little Help needed moving to and from a bed to chair (including a wheelchair)?: A Little Help needed walking in hospital room?: A Little Help needed climbing  3-5 steps with a railing? : A Little 6 Click Score: 15    End of Session   Activity Tolerance: Patient tolerated treatment well Patient left: in bed;with call bell/phone within reach Nurse Communication: Mobility status PT Visit Diagnosis: Unsteadiness on feet (R26.81);Repeated falls (R29.6);Pain Pain - Right/Left:  (bil) Pain - part of body: Shoulder (neck)     Time: 1010-1031 PT Time Calculation (min) (ACUTE ONLY): 21 min  Charges:  $Gait Training: 8-22 mins                    G Codes:       03/23/2017  [name], PT [phone]  (pager)   Rachael Johnson Mar 23, 2017, 11:59 AM

## 2017-03-19 NOTE — Care Management Important Message (Signed)
Important Message  Patient Details  Name: Rachael Johnson MRN: [id number] Date of Birth: 18-Aug-1950   Medicare Important Message Given:  Yes    Nathen May 03/19/2017, 11:27 AM
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
## 2017-03-19 NOTE — Progress Notes (Signed)
Physical Therapy Wound Treatment Patient Details  Name: KAMBRI DISMORE MRN: [id number] Date of Birth: 06/29/1951  Today's Date: 03/19/2017 Time: 0981-1914 Time Calculation (min): 29 min  Subjective  Subjective: Patient c/o increased pain today.  More quiet today.  Pain Score:  6/10  Wound Assessment  Wound / Incision (Open or Dehisced) 03/13/17 Non-pressure wound Shoulder Right;Posterior (Active)  Dressing Type ABD;Gauze (Comment);Moist to dry 03/19/2017 11:45 AM  Dressing Changed Changed 03/18/2017  9:53 AM  Dressing Status Clean;Dry;Intact 03/19/2017 11:45 AM  Dressing Change Frequency Daily 03/19/2017 11:45 AM  Site / Wound Assessment Brown;Yellow;Pink 03/19/2017 11:45 AM  % Wound base Red or Granulating 45% 03/19/2017 11:45 AM  % Wound base Yellow/Fibrinous Exudate 55% 03/19/2017 11:45 AM  % Wound base Black/Eschar 0% 03/19/2017 11:45 AM  % Wound base Other/Granulation Tissue (Comment) 0% 03/19/2017 11:45 AM  Peri-wound Assessment Erythema (blanchable) 03/19/2017 11:45 AM  Wound Length (cm) 8 cm 03/15/2017 12:44 PM  Wound Width (cm) 10 cm 03/15/2017 12:44 PM  Wound Depth (cm) 0 cm 03/15/2017 12:44 PM  Margins Unattached edges (unapproximated) 03/19/2017 11:45 AM  Closure None 03/19/2017 11:45 AM  Drainage Amount Minimal 03/19/2017 11:45 AM  Drainage Description Serosanguineous;Sanguineous 03/19/2017 11:45 AM  Non-staged Wound Description Partial thickness 03/19/2017 11:45 AM  Treatment Debridement (Selective);Cleansed;Hydrotherapy (Pulse lavage);Packing (Saline gauze) 03/19/2017 11:45 AM      Hydrotherapy Pulsed lavage therapy - wound location: R posterior shoulder Pulsed Lavage with Suction (psi): 8 psi Pulsed Lavage with Suction - Normal Saline Used: 1000 mL Pulsed Lavage Tip: Tip with splash shield Selective Debridement Selective Debridement - Location: posterior shoulder Selective Debridement - Tools Used: Forceps;Scalpel Selective Debridement - Tissue Removed: yellow eschar   Wound  Assessment and Plan  Wound Therapy - Assess/Plan/Recommendations Wound Therapy - Clinical Statement: pt can benefit from PLS to soften tenacious yellow eschar for selective debridement.  Expect wound to be relatively superficial. Wound Therapy - Functional Problem List: pt usually functional and independent Hydrotherapy Plan: Debridement;Dressing change;Patient/family education Wound Therapy - Frequency: 6X / week Wound Therapy - Follow Up Recommendations: Home health RN Wound Plan: see above  Wound Therapy Goals- Improve the function of patient's integumentary system by progressing the wound(s) through the phases of wound healing (inflammation - proliferation - remodeling) by: Decrease Necrotic Tissue to: 25% Decrease Necrotic Tissue - Progress: Progressing toward goal Increase Granulation Tissue to: 75% Increase Granulation Tissue - Progress: Progressing toward goal Goals/treatment plan/discharge plan were made with and agreed upon by patient/family: Yes Time For Goal Achievement: 7 days Wound Therapy - Potential for Goals: Good  Goals will be updated until maximal potential achieved or discharge criteria met.  Discharge criteria: when goals achieved, discharge from hospital, MD decision/surgical intervention, no progress towards goals, refusal/missing three consecutive treatments without notification or medical reason.  GP     [name] Selia Wareing 03/19/2017, 11:47 AM  03/19/2017  [name], [place] [phone]  (pager)

## 2017-03-19 NOTE — Progress Notes (Signed)
Subjective: Interval History: has complaints sore on lesion on shoulder where laid.  Objective: Vital signs in last 24 hours: Temp:  [98.5 F (36.9 C)-98.6 F (37 C)] 98.5 F (36.9 C) (08/20 0534) Pulse Rate:  [78-83] 78 (08/20 0000) Resp:  [15-17] 15 (08/19 2000) BP: (117-126)/(68-74) 126/68 (08/19 2000) SpO2:  [93 %-94 %] 93 % (08/20 0845) Weight:  [71.7 kg (158 lb)] 71.7 kg (158 lb) (08/20 0545) Weight change: 5.968 kg (13 lb 2.5 oz)  Intake/Output from previous day: 08/19 0701 - 08/20 0700 In: 3856.3 [P.O.:240; I.V.:3576.3; Blood:40] Out: 1800 [Urine:1800] Intake/Output this shift: Total I/O In: 240 [P.O.:240] Out: -   General appearance: alert, cooperative, pale and depressed Resp: clear to auscultation bilaterally Cardio: S1, S2 normal and systolic murmur: systolic ejection 2/6, decrescendo at 2nd left intercostal space GI: soft, non-tender; bowel sounds normal; no masses,  no organomegaly Extremities: edema 3+  Lab Results:  Recent Labs  03/18/17 0236 03/18/17 1407 03/19/17 0135  WBC 7.6  --  5.6  HGB 7.7* 10.9* 9.7*  HCT 23.1* 32.5* 28.7*  PLT 395  --  317   BMET:  Recent Labs  03/18/17 0236 03/19/17 0135  NA 136 137  K 3.7 3.7  CL 93* 93*  CO2 30 30  GLUCOSE 125* 94  BUN 98* 99*  CREATININE 7.18* 7.13*  CALCIUM 7.5* 7.4*   No results for input(s): PTH in the last 72 hours. Iron Studies: No results for input(s): IRON, TIBC, TRANSFERRIN, FERRITIN in the last 72 hours.  Studies/Results: No results found.  I have reviewed the patient's current medications.  Assessment/Plan: 1 AKI Cr plateau, nonoliguric. Should turn around. Vol xs stop ivf 2 Sore on shoulder 3 Anemia 4 Depression P stop ivf, allow to diurese, follow chem   LOS: 6 days   Dimitrious Micciche L 03/19/2017,12:20 PM

## 2017-03-20 ENCOUNTER — Other Ambulatory Visit: Payer: Self-pay | Admitting: Hematology & Oncology

## 2017-03-20 DIAGNOSIS — S00522A Blister (nonthermal) of oral cavity, initial encounter: Secondary | ICD-10-CM

## 2017-03-20 DIAGNOSIS — D473 Essential (hemorrhagic) thrombocythemia: Secondary | ICD-10-CM

## 2017-03-20 LAB — COMPREHENSIVE METABOLIC PANEL
ALT: 47 U/L (ref 14–54)
AST: 26 U/L (ref 15–41)
Albumin: 2.1 g/dL — ABNORMAL LOW (ref 3.5–5.0)
Alkaline Phosphatase: 102 U/L (ref 38–126)
Anion gap: 14 (ref 5–15)
BUN: 96 mg/dL — ABNORMAL HIGH (ref 6–20)
CO2: 28 mmol/L (ref 22–32)
Calcium: 7.8 mg/dL — ABNORMAL LOW (ref 8.9–10.3)
Chloride: 95 mmol/L — ABNORMAL LOW (ref 101–111)
Creatinine, Ser: 6.76 mg/dL — ABNORMAL HIGH (ref 0.44–1.00)
GFR calc Af Amer: 7 mL/min — ABNORMAL LOW (ref >60)
GFR calc non Af Amer: 6 mL/min — ABNORMAL LOW (ref >60)
Glucose, Bld: 90 mg/dL (ref 65–99)
Potassium: 3.9 mmol/L (ref 3.5–5.1)
Sodium: 137 mmol/L (ref 135–145)
Total Bilirubin: 0.5 mg/dL (ref 0.3–1.2)
Total Protein: 5.4 g/dL — ABNORMAL LOW (ref 6.5–8.1)

## 2017-03-20 LAB — URINALYSIS, ROUTINE W REFLEX MICROSCOPIC
Bilirubin Urine: NEGATIVE
Glucose, UA: NEGATIVE mg/dL
Ketones, ur: NEGATIVE mg/dL
Nitrite: NEGATIVE
Protein, ur: 30 mg/dL — AB
Specific Gravity, Urine: 1.012 (ref 1.005–1.030)
pH: 8 (ref 5.0–8.0)

## 2017-03-20 LAB — CBC WITH DIFFERENTIAL/PLATELET
Basophils Absolute: 0 10*3/uL (ref 0.0–0.1)
Basophils Relative: 0 %
Eosinophils Absolute: 0.3 10*3/uL (ref 0.0–0.7)
Eosinophils Relative: 5 %
HCT: 28.8 % — ABNORMAL LOW (ref 36.0–46.0)
Hemoglobin: 9.6 g/dL — ABNORMAL LOW (ref 12.0–15.0)
Lymphocytes Relative: 11 %
Lymphs Abs: 0.6 10*3/uL — ABNORMAL LOW (ref 0.7–4.0)
MCH: 38.9 pg — ABNORMAL HIGH (ref 26.0–34.0)
MCHC: 33.3 g/dL (ref 30.0–36.0)
MCV: 116.6 fL — ABNORMAL HIGH (ref 78.0–100.0)
Monocytes Absolute: 0.4 10*3/uL (ref 0.1–1.0)
Monocytes Relative: 6 %
Neutro Abs: 4.6 10*3/uL (ref 1.7–7.7)
Neutrophils Relative %: 78 %
Platelets: 339 10*3/uL (ref 150–400)
RBC: 2.47 MIL/uL — ABNORMAL LOW (ref 3.87–5.11)
RDW: 20.2 % — ABNORMAL HIGH (ref 11.5–15.5)
WBC: 5.9 10*3/uL (ref 4.0–10.5)

## 2017-03-20 LAB — RENAL FUNCTION PANEL
Albumin: 2.2 g/dL — ABNORMAL LOW (ref 3.5–5.0)
Anion gap: 12 (ref 5–15)
BUN: 98 mg/dL — ABNORMAL HIGH (ref 6–20)
CO2: 29 mmol/L (ref 22–32)
Calcium: 7.8 mg/dL — ABNORMAL LOW (ref 8.9–10.3)
Chloride: 95 mmol/L — ABNORMAL LOW (ref 101–111)
Creatinine, Ser: 6.87 mg/dL — ABNORMAL HIGH (ref 0.44–1.00)
GFR calc Af Amer: 7 mL/min — ABNORMAL LOW (ref >60)
GFR calc non Af Amer: 6 mL/min — ABNORMAL LOW (ref >60)
Glucose, Bld: 93 mg/dL (ref 65–99)
Phosphorus: 6.9 mg/dL — ABNORMAL HIGH (ref 2.5–4.6)
Potassium: 3.9 mmol/L (ref 3.5–5.1)
Sodium: 136 mmol/L (ref 135–145)

## 2017-03-20 LAB — IRON AND TIBC
Iron: 27 ug/dL — ABNORMAL LOW (ref 28–170)
Saturation Ratios: 16 % (ref 10.4–31.8)
TIBC: 171 ug/dL — ABNORMAL LOW (ref 250–450)
UIBC: 144 ug/dL

## 2017-03-20 LAB — MAGNESIUM: Magnesium: 2 mg/dL (ref 1.7–2.4)

## 2017-03-20 LAB — PHOSPHORUS: Phosphorus: 6.9 mg/dL — ABNORMAL HIGH (ref 2.5–4.6)

## 2017-03-20 MED ORDER — SEVELAMER CARBONATE 800 MG PO TABS
1600.0000 mg | ORAL_TABLET | Freq: Three times a day (TID) | ORAL | Status: DC
  Administered 2017-03-20 – 2017-03-23 (×10): 1600 mg via ORAL
  Filled 2017-03-20 (×10): qty 2

## 2017-03-20 MED ORDER — MAGIC MOUTHWASH W/LIDOCAINE
5.0000 mL | Freq: Four times a day (QID) | ORAL | Status: DC
  Administered 2017-03-20 – 2017-03-23 (×13): 5 mL via ORAL
  Filled 2017-03-20 (×15): qty 5

## 2017-03-20 MED ORDER — SEVELAMER CARBONATE 800 MG PO TABS
800.0000 mg | ORAL_TABLET | Freq: Three times a day (TID) | ORAL | Status: DC
  Administered 2017-03-20: 800 mg via ORAL
  Filled 2017-03-20: qty 1

## 2017-03-20 NOTE — Progress Notes (Signed)
Subjective: Interval History: has no complaint .  Objective: Vital signs in last 24 hours: Temp:  [97.9 F (36.6 C)-98.9 F (37.2 C)] 97.9 F (36.6 C) (08/21 0800) Pulse Rate:  [73-80] 80 (08/21 0800) Resp:  [11-19] 19 (08/21 0800) BP: (104-127)/(64-73) 118/68 (08/21 0800) SpO2:  [89 %-96 %] 94 % (08/21 0818) Weight:  [73.3 kg (161 lb 8 oz)] 73.3 kg (161 lb 8 oz) (08/21 0534) Weight change: 1.588 kg (3 lb 8 oz)  Intake/Output from previous day: 08/20 0701 - 08/21 0700 In: 720 [P.O.:720] Out: 1300 [Urine:1300] Intake/Output this shift: Total I/O In: 240 [P.O.:240] Out: 750 [Urine:750]  General appearance: alert, cooperative, no distress and pale Resp: clear to auscultation bilaterally Cardio: S1, S2 normal and systolic murmur: holosystolic 2/6, blowing at apex GI: soft, non-tender; bowel sounds normal; no masses,  no organomegaly Extremitie3+edema 3+  Lab Results:  Recent Labs  03/19/17 0135 03/20/17 0240  WBC 5.6 5.9  HGB 9.7* 9.6*  HCT 28.7* 28.8*  PLT 317 339   BMET:  Recent Labs  03/19/17 0135 03/20/17 0240  NA 137 137  136  K 3.7 3.9  3.9  CL 93* 95*  95*  CO2 30 28  29   GLUCOSE 94 90  93  BUN 99* 96*  98*  CREATININE 7.13* 6.76*  6.87*  CALCIUM 7.4* 7.8*  7.8*   No results for input(s): PTH in the last 72 hours. Iron Studies: No results for input(s): IRON, TIBC, TRANSFERRIN, FERRITIN in the last 72 hours.  Studies/Results: No results found.  I have reviewed the patient's current medications.  Assessment/Plan: 1 AKI nonoliguric, vol xs.  Cr improved. Will allow to autodiurese.  Add Lasix if needed 2 ^ P binders 3 Pressure sore local care 4 Anemia check FE 5 Debill P follow chem , ^ binders, wound care    LOS: 7 days   Zehava Turski L 03/20/2017,10:51 AM
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
## 2017-03-20 NOTE — Progress Notes (Addendum)
Spoke with Dr. Alfredia Ferguson. Per MD foley catheter to stay in place for diuresis. Pt is ambulatory - currently walking with PT. Urine yellow, clear with odor - MD aware - white count normal, afebrile.  Pt updated as to plan of care.   Fritz Pickerel, RN

## 2017-03-20 NOTE — Progress Notes (Signed)
Physical Therapy Wound Treatment Patient Details  Name: BATYA CITRON MRN: [id number] Date of Birth: [birth year]  Today's Date: 03/20/2017 Time: 9166-0600 Time Calculation (min): 36 min  Subjective  Subjective: So sore... Patient and Family Stated Goals: I want to get home  Pain Score: Pain Score: 6   Wound Assessment  Wound / Incision (Open or Dehisced) 03/13/17 Non-pressure wound Shoulder Right;Posterior (Active)  Dressing Type ABD;Gauze (Comment);Moist to dry 03/20/2017 11:16 AM  Dressing Changed Changed 03/18/2017  9:53 AM  Dressing Status Clean;Dry;Intact 03/20/2017 11:16 AM  Dressing Change Frequency Daily 03/20/2017 11:16 AM  Site / Wound Assessment Brown;Yellow;Pink 03/20/2017 11:16 AM  % Wound base Red or Granulating 45% 03/20/2017 11:16 AM  % Wound base Yellow/Fibrinous Exudate 20% 03/20/2017 11:16 AM  % Wound base Black/Eschar 0% 03/20/2017 11:16 AM  % Wound base Other/Granulation Tissue (Comment) 35% 03/20/2017 11:16 AM  Peri-wound Assessment Erythema (blanchable) 03/20/2017 11:16 AM  Wound Length (cm) 8 cm 03/15/2017 12:44 PM  Wound Width (cm) 10 cm 03/15/2017 12:44 PM  Wound Depth (cm) 0 cm 03/15/2017 12:44 PM  Margins Unattached edges (unapproximated) 03/20/2017 11:16 AM  Closure None 03/20/2017 11:16 AM  Drainage Amount Minimal 03/20/2017 11:16 AM  Drainage Description Serosanguineous;Sanguineous 03/20/2017 11:16 AM  Non-staged Wound Description Partial thickness 03/20/2017 11:16 AM  Treatment Cleansed;Debridement (Selective);Hydrotherapy (Pulse lavage);Packing (Saline gauze) 03/20/2017 11:16 AM   Santyl applied to wound bed prior to applying dressing.    Hydrotherapy Pulsed lavage therapy - wound location: R posterior shoulder Pulsed Lavage with Suction (psi): 8 psi Pulsed Lavage with Suction - Normal Saline Used: 1000 mL Pulsed Lavage Tip: Tip with splash shield Selective Debridement Selective Debridement - Location: posterior- superior shoulder Selective Debridement - Tools  Used: Forceps;Scalpel Selective Debridement - Tissue Removed: yellow eschar   Wound Assessment and Plan  Wound Therapy - Assess/Plan/Recommendations Wound Therapy - Clinical Statement: pt can benefit from PLS to soften tenacious yellow eschar for selective debridement.  Expect wound to be relatively superficial. Wound Therapy - Functional Problem List: pt usually functional and independent Hydrotherapy Plan: Debridement;Dressing change;Patient/family education Wound Therapy - Frequency: 6X / week Wound Therapy - Follow Up Recommendations: Home health RN Wound Plan: see above  Wound Therapy Goals- Improve the function of patient's integumentary system by progressing the wound(s) through the phases of wound healing (inflammation - proliferation - remodeling) by: Decrease Necrotic Tissue to: 25% Decrease Necrotic Tissue - Progress: Progressing toward goal Increase Granulation Tissue to: 75% Increase Granulation Tissue - Progress: Progressing toward goal Goals/treatment plan/discharge plan were made with and agreed upon by patient/family: Yes Time For Goal Achievement: 7 days Wound Therapy - Potential for Goals: Good  Goals will be updated until maximal potential achieved or discharge criteria met.  Discharge criteria: when goals achieved, discharge from hospital, MD decision/surgical intervention, no progress towards goals, refusal/missing three consecutive treatments without notification or medical reason.  GP     [name] Marie Chow 03/20/2017, 11:18 AM 03/20/2017  [name], [place] [phone]  (pager)

## 2017-03-21 ENCOUNTER — Inpatient Hospital Stay (HOSPITAL_COMMUNITY): Payer: Medicare Other

## 2017-03-21 DIAGNOSIS — R0602 Shortness of breath: Secondary | ICD-10-CM

## 2017-03-21 LAB — HEPATIC FUNCTION PANEL
ALT: 43 U/L (ref 14–54)
AST: 26 U/L (ref 15–41)
Albumin: 2.4 g/dL — ABNORMAL LOW (ref 3.5–5.0)
Alkaline Phosphatase: 106 U/L (ref 38–126)
Bilirubin, Direct: 0.1 mg/dL (ref 0.1–0.5)
Indirect Bilirubin: 0.5 mg/dL (ref 0.3–0.9)
Total Bilirubin: 0.6 mg/dL (ref 0.3–1.2)
Total Protein: 5.9 g/dL — ABNORMAL LOW (ref 6.5–8.1)

## 2017-03-21 LAB — URINALYSIS, ROUTINE W REFLEX MICROSCOPIC
Bilirubin Urine: NEGATIVE
Glucose, UA: NEGATIVE mg/dL
Ketones, ur: NEGATIVE mg/dL
Nitrite: NEGATIVE
Protein, ur: 30 mg/dL — AB
Specific Gravity, Urine: 1.012 (ref 1.005–1.030)
pH: 8 (ref 5.0–8.0)

## 2017-03-21 LAB — CBC WITH DIFFERENTIAL/PLATELET
Basophils Absolute: 0 10*3/uL (ref 0.0–0.1)
Basophils Relative: 0 %
Eosinophils Absolute: 0.3 10*3/uL (ref 0.0–0.7)
Eosinophils Relative: 4 %
HCT: 30.1 % — ABNORMAL LOW (ref 36.0–46.0)
Hemoglobin: 9.8 g/dL — ABNORMAL LOW (ref 12.0–15.0)
Lymphocytes Relative: 9 %
Lymphs Abs: 0.8 10*3/uL (ref 0.7–4.0)
MCH: 38.3 pg — ABNORMAL HIGH (ref 26.0–34.0)
MCHC: 32.6 g/dL (ref 30.0–36.0)
MCV: 117.6 fL — ABNORMAL HIGH (ref 78.0–100.0)
Monocytes Absolute: 0.3 10*3/uL (ref 0.1–1.0)
Monocytes Relative: 4 %
Neutro Abs: 7.1 10*3/uL (ref 1.7–7.7)
Neutrophils Relative %: 83 %
Platelets: 341 10*3/uL (ref 150–400)
RBC: 2.56 MIL/uL — ABNORMAL LOW (ref 3.87–5.11)
RDW: 18.8 % — ABNORMAL HIGH (ref 11.5–15.5)
WBC: 8.5 10*3/uL (ref 4.0–10.5)

## 2017-03-21 LAB — BASIC METABOLIC PANEL
Anion gap: 13 (ref 5–15)
BUN: 87 mg/dL — ABNORMAL HIGH (ref 6–20)
CO2: 29 mmol/L (ref 22–32)
Calcium: 8.1 mg/dL — ABNORMAL LOW (ref 8.9–10.3)
Chloride: 97 mmol/L — ABNORMAL LOW (ref 101–111)
Creatinine, Ser: 6.22 mg/dL — ABNORMAL HIGH (ref 0.44–1.00)
GFR calc Af Amer: 7 mL/min — ABNORMAL LOW (ref >60)
GFR calc non Af Amer: 6 mL/min — ABNORMAL LOW (ref >60)
Glucose, Bld: 105 mg/dL — ABNORMAL HIGH (ref 65–99)
Potassium: 3.7 mmol/L (ref 3.5–5.1)
Sodium: 139 mmol/L (ref 135–145)

## 2017-03-21 LAB — MAGNESIUM: Magnesium: 1.8 mg/dL (ref 1.7–2.4)

## 2017-03-21 LAB — PHOSPHORUS: Phosphorus: 6 mg/dL — ABNORMAL HIGH (ref 2.5–4.6)

## 2017-03-21 MED ORDER — LACTULOSE 10 GM/15ML PO SOLN
20.0000 g | Freq: Two times a day (BID) | ORAL | Status: DC
  Administered 2017-03-21 – 2017-03-23 (×4): 20 g via ORAL
  Filled 2017-03-21 (×4): qty 30

## 2017-03-21 MED ORDER — ALBUTEROL SULFATE (2.5 MG/3ML) 0.083% IN NEBU: 2.5000 mg | INHALATION_SOLUTION | RESPIRATORY_TRACT | Status: DC | PRN

## 2017-03-21 MED ORDER — ALBUTEROL SULFATE HFA 108 (90 BASE) MCG/ACT IN AERS
2.0000 | INHALATION_SPRAY | RESPIRATORY_TRACT | Status: DC | PRN
  Filled 2017-03-21: qty 6.7

## 2017-03-21 MED ORDER — SODIUM CHLORIDE 0.9 % IV SOLN
510.0000 mg | Freq: Once | INTRAVENOUS | Status: AC
  Administered 2017-03-21: 510 mg via INTRAVENOUS
  Filled 2017-03-21: qty 17

## 2017-03-21 NOTE — Progress Notes (Signed)
PROGRESS NOTE    [name]  ZOX:[id number] DOB: 1951/03/03 DOA: 03/12/2017 PCP: Mosie Lukes, MD (Confirm with patient/family/NH records and if not entered, this HAS to be entered at [facility] point of entry. "No PCP" if truly none.)   Brief Narrative:  Rachael D Wilsonis Ladana Chavero 66 y.o.femalewith medical history significant fordepression, anxiety, GERD, hypertension, and hypothyroidism, who presenting to the emergency department after being found down by her family. History was initially obtained through discussion with the ED personnel, patient's family, and review of the EMR. Family had reportedly spoken with the patient at approximately 5:30 PM on 03/11/2017 and she seemed to be in her usual state at that time. After that, she had not been answering her phone and family went to check on her tonight. They found her on the floor, complaining of pain in her chest, abdomen, and legs. She was brought into the ED for evaluation. She was unable to provide Ogechi Kuehnel history as to how she ended up on the floor.  On further discussion, it seems that she bent over and then something hit her head (she mentioned possibly Kaylanie Capili vase). There is no history of similar presentation per family report. She is not known to use alcohol or illicit substances. She was found to have Rhabdomyolysis and and AKI as well as elevated Troponin's.   Cardiology and Nephrology were consulted. Patient's CK has been improving and normalized on 8/20 at 158.  Cr worsened and peaked at 7.18 on 8/19. Per Nephro no indication for Dialysis and gave Lasix. Neprho recommended D/Cing IVF to allow Auto Diruresis  Patient states she is still sore but feels as if she is improving. Patient's hospital course wa complicated by Hb/Hct droppin so patient was ordered 1 unit of pRBC's to be transfused (8/19). Hb/Hct improved after Transfusion and Lasix IV 20 mg.   Patient is slowly improving and Hb/Hct has been stable. Nurse stated patient had odor to her urine  today but patient is Afebrile and has no Leukocytosis. Will have Nurse D/C foley and obtain U/Verner Mccrone. Patient was complaining of mouth soreness and magic mouthwash with lidocaine was ordered.  Yesterday with UA with large LE, but negative nitrite.  She had many squams.  Will recollect today.  CXR ordered due to crackles and SOB she notes similar to COPD flares at home.  Albuterol to bedside.    Assessment & Plan:   Principal Problem:   Rhabdomyolysis Active Problems:   Essential thrombocythemia ([place])   Hypothyroid   Depression with anxiety   Macrocytic anemia   Restless leg syndrome   Essential hypertension   AKI (acute kidney injury) ([place])   LFTs abnormal   Elevated troponin   Hypernatremia   Elevated LFTs   Sore mouth   Scalp wound   Back wound   Cardiac enzymes elevated   Hypomagnesemia   Acute Rhabdomyolysis; improved - Initially found down by family (last contacted her Geddy [place] day earlier).  Today she states that she thinks she was hit in the head when bending over to get something.   - Serum CK elevated to 32,000 on admission and now normalized and improved to 158 (8/20). - She was fluid-resuscitated in ED with 1 liter NS and 2 liters LR  - IVF now D/C'd per Nephro Rec's  - Initial Urinalysis showes Large Hgb but 0-5 RBC/HPF; UA from yesterday with small Hb and 0-5 RBC (repeating UA today for dysuria and squams on UA) - Nephrology Consulted and appreciated Recc's - PT Consulted  and did Hydrotherapy as well as regular Therapy  AKI/ Acute Renal Failure with Anuria/Oliguria 2/2 to Acute Rhabdomyolysis, improving - SCr is 2.97 on admission, previously wnl and now BUN/Cr worsened from to 98/7.18 -> and now trending down at 87/6.22 (most recent nephrology recs, pt ok to d/c when creatinine <5)  - She was clinically dehydrated and was treated with 3 liters IVF in ED and on Maintenance IVF with 110 of NS/hr changed to IV Sodium Bicarb gtt with 150 mEQ at 75 mL/hr on 03/15/17 and then  changed back to NS at 75 mL/hr on 03/18/17 and IVF D/C'd 03/19/17 - Patient had also been taking NSAIDS  (Naproxen for Arthritis)  - CT Abd/Pelvis showed punctate non-obstructing stones in the kidneys which was negative for Hydronephrosis or Ureteral Stone. Scattered sigmoid colon diverticula without acute inflammation and Mild Right LLL Subpleural interstitial density and nodulary that could reflect small airways infection or possible atypical PNA  - Avoid nephrotoxins, renally-dose medications, repeat chem panel yesterday - Foley now removed; voiding well   - Patient has made 1650 mL of Urine in the last 24 hours - Nephrology tried 1x dose of IV Lasix 40 mg and was given 03/16/17 and one time dose of IV 20 Lasix given after patient was transfused 1 unit of pRBC; Defer further Lasix Administration to Nephrology - Obtained Renal U/S and showed non-obstructing Right nephrolithiasis and no Hydronephrosis. There was Trace Right perinephric Fluid which was nonspecific. - Nephrology Consulted and appreciated Recc's; No Dialysis indicated at this time and recommend D/C'ing IVF.   Metabolic Acidosis Likely 2/2 to Acute Rhabdomyolysis, improved  -Bicarb went from 17 -> 29 -IVF now D/C'd -Nephrology Following  -Repeat CMP ion AM   Hyperphosphatemia, worsening -Likely from Renal Failure -Phos Level went from 4.5 ->6.9 -> improved today to 6.0 -Added Sevelamer Carbonate 1600 mg po TIDwm   -Continue to Monitor and repeat Phos Level in AM  Elevated Troponin, trending down - Troponin is elevated to 21.50 on arrival and now Trended down to 0.18 (8/20) - No hx of CAD, no anginal complaints, EKG without acute ischemic features  - C/w NTG 0.4 mg SL q88min x3 pRN - Patient was placed on Heparin gtt and Troponin Trending down; Heparin gtt now D/C'd - Per Cards will need Ischemic Workup but will wait until Renal Fxn Stablizes and likely can be done as an outpatient  - ECHOCardiogram done showed Systolic  function was normal. The estimated ejection fraction was in the range of 55% to 60%. Wall motion was normal; there were no regional wall motion abnormalities. Features areconsistent with Yameli Delamater pseudonormal left ventricular filling pattern,with concomitant abnormal relaxation and increased filling pressure (grade 2 diastolic dysfunction). - Cardiology consulted and appreciated; Cards Signed off  Abnormal LFT's / Elevated Transaminases likely from Rhabdomyolysis, improving - AST is 876, ALT 201 on Admission and trended down to 26 and 47 respectively  - Abd was tender on admission and CT abd/pelvis non-revealing  - Likely secondary to Rhabdomyolysis - Repeat CMP in AM   Hypernatremia, improved  - Secondary to dehydration  - Improved. Na+ went from 147 -> 139 - IVF Discontinued as Above  - Repeat chem panel in am   Depression, Anxiety  - C/w Buspirone 10 mg po TID, Citalopram 20 mg po Daily, Amitriptyline 20 mg qHS, and Venlafaxine XR 75 mg po Daily  - C/w Alprazolam 1 mg po TID prn Anxiety   Hypothyroidism  - TSH on Admission was 2.683 - Continue  Levothyroxine 88 mcg po Daily   GERD/Esophageal Reflex -C/w Famotidine 20 mg po qHS and with Pantoprazole 40 mg po BID  Restless Leg Syndrome -C/w Pramipexole 0.25-0.5 mg po qHS daily prn  Mouth Soreness with Mouth Blisters -Added Phenol Mouth Spray 1 spran prn yesterday and patient had relief -Added Magic Mouthwash with Lidocaine 5 mL QID -Consider adding Cepacol if not improving   Hx of Smoking/COPD/Asthma - noted episode of SOB last night which she describes as short lasting, typically what she associates with her COPD (she didn't have albuterol inhaler available to her last night to use).  Now resolved. - add on albuterol q4 hrs prn (discussed with RT who will get this bedside for her - she had some crackles in the lower lobes bilaterally [ ]  f/u CXR and order IS -C/w Mometasone-Formoterol 2 puff IH BID -C/w Montelukast 10 mg  po qHS  Back and Scalp Wounds -WOC Nurse appreciated -C/w Bacitracin ointment BID for Scalp and Collagenase Posterior Shoulder and Scalp Wound -PT to do Hydrotherapy and Regular Therapy  Essential Thrombocytosis, improved -Platelet Count went from 440 -> 339 -Continue to Monitor CBC daily  Macrocytic Anemia -Patient's Hb/Hct went from dropped to 7.7/23.1; Patient was transfused 1 unit of pRBC's and Hb/Hct improved to 10.9/32.5; Hb/Hct this AM was now 9.8/30.1 -Vitamin B12 was 230 and Folate Hemolysate was 491.7 and Folate RBDC was 1401 -Likely contributed 2/2 to Dilutional Drop from IVF  -Continue to Monitor for S/Sx of bleeding; Patient no longer on Heparin gtt -Repeat CBC in AM  Dysuria  Pyuria - due to foley? She had many squams on UA, will repeat and f/u culture as needed - UA and culture pending  Hypomagnesemia -Patient's Mag Level this AM was 2.0 -Continue to Montior and Replete as Necessary -Repeat Mag Level in AM   Constipation - home lactulose BID  Widening of left C3-4 facet with joint space irregularity - suggestive of inflammatory/degenerative arthropathy, consider MRI in f/u if hx of neck pain  Prolonged QTc - cardiology rec considering reducing celexa dosing, reduced to 20 mg.  Repeat EKG tomorrow AM.    DVT prophylaxis: heparin Code Status: full  Family Communication: did not desire me to call anyone today Disposition Plan: awaiting kidney function to improved   Consultants:   Nephrology  Cardiology (signed off)  Care management (medications)  Procedures: (Don't include imaging studies which can be auto populated. Include things that cannot be auto populated i.e. Echo, Carotid and venous dopplers, Foley, Bipap, HD, tubes/drains, wound vac, central lines etc)  8/15 Renal US with nonobstructive R nephrolithiasis without hydronephrosis, trace perinephric fluid  8/14 Lumbar/thoracic spine plain film without evidence of fx or subluxation  8/14 CXR  without acute cardiopulm process  8/14 CT head/c spine without acute intracranial abnormality, widening of LC3-4 facet with joint space irregularity, LUL ground glass opacities (consider atypical infxn vs pulm edema)  8/14 CT abdomen pelvis with nonobstructing stones, diverticula without acute inflamation, R lower lobe subpleural interstitial density and nodularity "small airways infection or possible atypical pneumonia"  Antimicrobials: (specify start and planned stop date. Auto populated tables are space occupying and do not give end dates)  none    Subjective: Nausea, no fevers chills or pain.  Up and walking to bathroom.  No problem with urine output.  Asking for home lactulose BID.  Notes some dysuria and frequency.   Objective: Vitals:   03/21/17 0209 03/21/17 0300 03/21/17 0425 03/21/17 0812  BP:   (!) 142/67  Pulse:      Resp: 18  19   Temp:  97.6 F (36.4 C) 99 F (37.2 C)   TempSrc:  Oral Oral   SpO2:   93% 90%  Weight: 70.8 kg (156 lb)     Height:        Intake/Output Summary (Last 24 hours) at 03/21/17 0905 Last data filed at 03/21/17 0200  Gross per 24 hour  Intake              960 ml  Output              900 ml  Net               60 ml   Filed Weights   03/19/17 0545 03/20/17 0534 03/21/17 0209  Weight: 71.7 kg (158 lb) 73.3 kg (161 lb 8 oz) 70.8 kg (156 lb)    Examination:  General exam: Appears calm and comfortable  Respiratory system: Clear to auscultation. Respiratory effort normal. Cardiovascular system: S1 & S2 heard, RRR. No JVD, murmurs, rubs, gallops or clicks.  Gastrointestinal system: Abdomen is nondistended, soft and nontender. No organomegaly or masses felt. Normal bowel sounds heard. Central nervous system: Alert and oriented. No focal neurological deficits. Extremities: 1+ LEE edema bilaterally Skin: large ulceration to scalp and R shoulder, abrasion to L shoulder (see images) Psychiatry: Judgement and insight appear normal. Mood &  affect appropriate.  Tele sinus 77-104        Data Reviewed: I have personally reviewed following labs and imaging studies  CBC:  Recent Labs Lab 03/17/17 0359 03/18/17 0236 03/18/17 1407 03/19/17 0135 03/20/17 0240 03/21/17 0226  WBC 7.4 7.6  --  5.6 5.9 8.5  NEUTROABS 6.1 6.1  --  4.2 4.6 7.1  HGB 9.4* 7.7* 10.9* 9.7* 9.6* 9.8*  HCT 28.7* 23.1* 32.5* 28.7* 28.8* 30.1*  MCV 125.3* 122.2*  --  114.3* 116.6* 117.6*  PLT 361 395  --  317 339 798   Basic Metabolic Panel:  Recent Labs Lab 03/17/17 0359 03/18/17 0236 03/19/17 0135 03/20/17 0240 03/21/17 0226  NA 138 136 137 137  136 139  K 4.1 3.7 3.7 3.9  3.9 3.7  CL 98* 93* 93* 95*  95* 97*  CO2 24 30 30 28  29 29   GLUCOSE 95 125* 94 90  93 105*  BUN 96* 98* 99* 96*  98* 87*  CREATININE 6.75* 7.18* 7.13* 6.76*  6.87* 6.22*  CALCIUM 7.8* 7.5* 7.4* 7.8*  7.8* 8.1*  MG 1.8 1.6* 1.9 2.0 1.8  PHOS 6.1* 5.9* 6.6* 6.9*  6.9* 6.0*   GFR: Estimated Creatinine Clearance: 8.5 mL/min (Amenah Tucci) (by C-G formula based on SCr of 6.22 mg/dL (H)). Liver Function Tests:  Recent Labs Lab 03/17/17 0359 03/18/17 0236 03/19/17 0135 03/20/17 0240 03/21/17 0226  AST 81* 49* 34 26 26  ALT 99* 72* 55* 47 43  ALKPHOS 85 84 90 102 106  BILITOT 0.6 0.5 0.6 0.5 0.6  PROT 5.6* 5.0* 5.0* 5.4* 5.9*  ALBUMIN 2.4* 2.1* 2.1* 2.1*  2.2* 2.4*   No results for input(s): LIPASE, AMYLASE in the last 168 hours. No results for input(s): AMMONIA in the last 168 hours. Coagulation Profile: No results for input(s): INR, PROTIME in the last 168 hours. Cardiac Enzymes:  Recent Labs Lab 03/15/17 0306 03/16/17 0223 03/17/17 0359 03/18/17 0950 03/19/17 0135  CKTOTAL 2,990* 1,059* 482* 232 158  CKMB 79.6* 31.1* 17.1* 16.1* 6.2*  TROPONINI 1.92*  --  0.51*  --  0.18*   BNP (last 3 results) No results for input(s): PROBNP in the last 8760 hours. HbA1C: No results for input(s): HGBA1C in the last 72 hours. CBG: No results for  input(s): GLUCAP in the last 168 hours. Lipid Profile: No results for input(s): CHOL, HDL, LDLCALC, TRIG, CHOLHDL, LDLDIRECT in the last 72 hours. Thyroid Function Tests: No results for input(s): TSH, T4TOTAL, FREET4, T3FREE, THYROIDAB in the last 72 hours. Anemia Panel:  Recent Labs  03/20/17 1048  TIBC 171*  IRON 27*   Sepsis Labs: No results for input(s): PROCALCITON, LATICACIDVEN in the last 168 hours.  No results found for this or any previous visit (from the past 240 hour(s)).       Radiology Studies: No results found.      Scheduled Meds: . amitriptyline  20 mg Oral QHS  . aspirin EC  81 mg Oral Daily  . bacitracin   Topical BID  . busPIRone  10 mg Oral TID  . citalopram  20 mg Oral q morning - 10a  . collagenase   Topical Daily  . cycloSPORINE  1 drop Both Eyes BID  . famotidine  20 mg Oral QHS  . fluticasone  2 spray Each Nare Daily  . heparin subcutaneous  5,000 Units Subcutaneous Q8H  . levothyroxine  88 mcg Oral QAC breakfast  . loratadine  10 mg Oral Daily  . magic mouthwash w/lidocaine  5 mL Oral QID  . mometasone-formoterol  2 puff Inhalation BID  . montelukast  10 mg Oral QHS  . multivitamin   Oral q morning - 10a  . pantoprazole  40 mg Oral BID  . pramipexole  0.25-0.5 mg Oral QHS  . sevelamer carbonate  1,600 mg Oral TID WC  . venlafaxine XR  75 mg Oral Q breakfast   Continuous Infusions:   LOS: 8 days    Time spent: 35 minutes    Tashonna Descoteaux Melven Sartorius, MD [facility] Pager [phone]  If 7PM-7AM, please contact night-coverage [email or web address] Password [email or web address] 03/21/2017, 9:05 AM

## 2017-03-21 NOTE — Progress Notes (Signed)
Pt oxygen saturation less than 90% - as low as 83%. Educated patient on use of incentive spirometer with teach back. Pt able to get to 750. Educated on need to use every hour. O2 saturations up to 92% with use of IS. Pt place on 2L [place] to maintain saturations of O2 > 88%.   Fritz Pickerel, RN

## 2017-03-21 NOTE — Care Management Note (Signed)
Case Management Note Marvetta Gibbons RN, BSN Unit 4E-Case Manager [phone]  Patient Details  Name: Rachael Johnson MRN: [id number] Date of Birth: 1951/05/02  Subjective/Objective:  Pt admitted s/p fall at home with rhabdo                   Action/Plan: PTA pt lived at home alone-  Has supportive family nearby- referral received for Symbicort needs- spoke with pt at bedside- per conversation pt states that she has Medicare coverage for medications- however her Symbicort is not covered under her plan- and her copay is $143- she states she can not afford this- there is no coupon that she can use as it is only for commercially insured. She is followed by Dr. Melvyn Novas and reports that she has spoken with him but he wants her on this drug- have encouraged pt to have another conversation with DR. Wert regarding that fact that she can not afford this drug- also to see what drug is on her plan that is covered and see if he would be ok with an alternate drug that is covered that she can afford. Pt would not be eligible for pt assistance. -  Pt reports that she is independent - still drives and takes care of herself. She has supportive family that assist as needed. CM to follow for any further needs.   Expected Discharge Date:                  Expected Discharge Plan:  [place]  In-House Referral:     Discharge planning Services  CM Consult, Medication Assistance  Post Acute Care Choice:  Home Health Choice offered to:     DME Arranged:    DME Agency:     HH Arranged:    HH Agency:     Status of Service:  In process, will continue to follow  If discussed at Long Length of Stay Meetings, dates discussed: 8/21, 8/23    Discharge Disposition:   Additional Comments:  03/21/17- 1700- Valentina Gu, CM- pt continues to may progress, renal function improving,  Still having hydrotherapy on shoulder per PT- recommendations for HHPT/OT and 3n1 if shoulder continues to improve  with hydrotherapy otherwise may need STSNF- CM will continue to follow.   Dawayne Patricia, RN 03/21/2017, 10:17 PM

## 2017-03-21 NOTE — Progress Notes (Signed)
Pt to transfer to 5W23. Report called. Pt updated as to plan of care.   Fritz Pickerel, RN

## 2017-03-21 NOTE — Progress Notes (Signed)
Subjective: Interval History: has no complaint , doing better.  Objective: Vital signs in last 24 hours: Temp:  [97.6 F (36.4 C)-99 F (37.2 C)] 99 F (37.2 C) (08/22 0425) Pulse Rate:  [74-77] 77 (08/21 2012) Resp:  [9-19] 19 (08/22 0425) BP: (109-142)/(67-74) 142/67 (08/22 0425) SpO2:  [88 %-97 %] 97 % (08/22 0840) Weight:  [70.8 kg (156 lb)] 70.8 kg (156 lb) (08/22 0209) Weight change: -2.495 kg (-5 lb 8 oz)  Intake/Output from previous day: 08/21 0701 - 08/22 0700 In: 1200 [P.O.:1200] Out: 7062 [Urine:1650] Intake/Output this shift: No intake/output data recorded.  General appearance: alert, cooperative, no distress and pale Resp: clear to auscultation bilaterally Cardio: S1, S2 normal and systolic murmur: systolic ejection 2/6, decrescendo at 2nd left intercostal space GI: soft, non-tender; bowel sounds normal; no masses,  no organomegaly Extremities: edema 3+  Lab Results:  Recent Labs  03/20/17 0240 03/21/17 0226  WBC 5.9 8.5  HGB 9.6* 9.8*  HCT 28.8* 30.1*  PLT 339 341   BMET:  Recent Labs  03/20/17 0240 03/21/17 0226  NA 137  136 139  K 3.9  3.9 3.7  CL 95*  95* 97*  CO2 28  29 29   GLUCOSE 90  93 105*  BUN 96*  98* 87*  CREATININE 6.76*  6.87* 6.22*  CALCIUM 7.8*  7.8* 8.1*   No results for input(s): PTH in the last 72 hours. Iron Studies:  Recent Labs  03/20/17 1048  IRON 27*  TIBC 171*    Studies/Results: Dg Chest Port 1 View  Result Date: 03/21/2017 CLINICAL DATA:  Shortness of breath . EXAM: PORTABLE CHEST 1 VIEW COMPARISON:  03/12/2017. FINDINGS: Mild cardiomegaly with mild bilateral pulmonary interstitial prominence. Mild CHF cannot be excluded. Pneumonitis cannot be excluded. Mild right mid lung field and bibasilar subsegmental atelectasis No pleural effusion or pneumothorax . No pneumothorax. IMPRESSION: 1. Mild cardiomegaly with mild bilateral interstitial prominence. Mild CHF cannot be excluded. Mild pneumonitis cannot be  excluded. 2. Low lung volumes. Mild right mid lung field and bibasilar subsegmental atelectasis. Electronically Signed   By: Marcello Moores  Register   On: 03/21/2017 09:13    I have reviewed the patient's current medications.  Assessment/Plan: 1 AKI rhabdo. Nonoliguric , recovering function.  Once Cr<5 can d/c home. Vol xs 2 Anemia Fe low give 1 dose 3 shoulder wound P follow function, allow to diurese    LOS: 8 days   Lashonta Pilling L 03/21/2017,11:49 AM

## 2017-03-21 NOTE — Progress Notes (Signed)
Pt. Comfortable, free of pain. Call light is within patient reach, phone explained and bed alarm activated.

## 2017-03-21 NOTE — Progress Notes (Signed)
Physical Therapy Treatment Patient Details Name: Rachael Johnson MRN: [id number] DOB: 07-08-1951 Today's Date: 03/21/2017    History of Present Illness pt is a 66 y/o female with pmh significant for depession, anxiety, HTN, seizures, macrocytic anemia, RLS, admitted after being found down in her home (suspected on floor as much as 24 hours) sustaining multiple abrasions.  Work up includes acute rhabdomyolysis, AKI, hypernatremia due to dehydration.    PT Comments    Today emphasis on taking some of patient  "crutches" and assistance and letting pt rely only on the RW.  Mobility more steady overall, but sats dropping on RA during gait in the halls.   Follow Up Recommendations  Home health PT;Other (comment);Supervision/Assistance - 24 hour     Equipment Recommendations  None recommended by PT    Recommendations for Other Services       Precautions / Restrictions Precautions Precautions: Fall Restrictions Weight Bearing Restrictions: No    Mobility  Bed Mobility Overal bed mobility: Needs Assistance       Supine to sit: Supervision Sit to supine: Supervision   General bed mobility comments: starting to me less effortfully  Transfers Overall transfer level: Needs assistance   Transfers: Sit to/from Stand Sit to Stand: Supervision         General transfer comment: cues for hand placement.   Ambulation/Gait Ambulation/Gait assistance: Min guard Ambulation Distance (Feet): 350 Feet Assistive device: Rolling walker (2 wheeled) Gait Pattern/deviations: Step-through pattern   Gait velocity interpretation: at or above normal speed for age/gender General Gait Details: generally more rapid average speed and able to speed up to cue.  Overall steady and ready to try no AD.  Sats dropping into the mid 80's on RA so will need portable oxygen   Stairs            Wheelchair Mobility    Modified Rankin (Stroke Patients Only)       Balance Overall balance  assessment: Needs assistance   Sitting balance-Leahy Scale: Good     Standing balance support: No upper extremity supported;Single extremity supported Standing balance-Leahy Scale: Fair Standing balance comment: no external support for static standing.  Still needed RW for confidence dynamically                            Cognition Arousal/Alertness: Awake/alert Behavior During Therapy: WFL for tasks assessed/performed Overall Cognitive Status: Within Functional Limits for tasks assessed                                        Exercises      General Comments General comments (skin integrity, edema, etc.): sats in mid 80's on RA during rest with good waveform.      Pertinent Vitals/Pain Pain Assessment: Faces Faces Pain Scale: Hurts little more Pain Location: shoulders and neck Pain Descriptors / Indicators: Aching;Sore;Guarding Pain Intervention(s): Monitored during session    Home Living                      Prior Function            PT Goals (current goals can now be found in the care plan section) Acute Rehab PT Goals Patient Stated Goal: home independent PT Goal Formulation: With patient Time For Goal Achievement: 03/30/17 Potential to Achieve Goals: Good Progress towards PT goals: Progressing toward  goals    Frequency    Min 3X/week      PT Plan Current plan remains appropriate    Co-evaluation              AM-PAC PT "6 Clicks" Daily Activity  Outcome Measure  Difficulty turning over in bed (including adjusting bedclothes, sheets and blankets)?: A Little Difficulty moving from lying on back to sitting on the side of the bed? : A Little Difficulty sitting down on and standing up from a chair with arms (e.g., wheelchair, bedside commode, etc,.)?: A Little Help needed moving to and from a bed to chair (including a wheelchair)?: A Little Help needed walking in hospital room?: A Little Help needed climbing 3-5  steps with a railing? : A Little 6 Click Score: 18    End of Session   Activity Tolerance: Patient tolerated treatment well Patient left: in bed;with call bell/phone within reach Nurse Communication: Mobility status PT Visit Diagnosis: Unsteadiness on feet (R26.81);Other abnormalities of gait and mobility (R26.89);Pain Pain - part of body: Shoulder     Time: 1000-1022 PT Time Calculation (min) (ACUTE ONLY): 22 min  Charges:  $Gait Training: 8-22 mins                    G Codes:       03/25/2017  [name], PT [phone]  (pager)   Rachael Johnson 03-25-2017, 11:16 AM

## 2017-03-21 NOTE — Progress Notes (Signed)
Occupational Therapy Treatment Patient Details Name: Rachael Johnson MRN: [id number] DOB: Jun 09, 1951 Today's Date: 03/21/2017    History of present illness pt is a 66 y/o female with pmh significant for depession, anxiety, HTN, seizures, macrocytic anemia, RLS, admitted after being found down in her home (suspected on floor as much as 24 hours) sustaining multiple abrasions.  Work up includes acute rhabdomyolysis, AKI, hypernatremia due to dehydration.   OT comments  Pt is making excellent progress toward goals.  She is able to perform ADLs in standing with min guard to min A - one LOB posteriorly when pulling gown overhead in standing.  02 sats 84% on RA, increased to mid 90s on 2L, no dyspnea noted.   Follow Up Recommendations  Home health OT;Supervision/Assistance - 24 hour    Equipment Recommendations  3 in 1 bedside commode    Recommendations for Other Services      Precautions / Restrictions Precautions Precautions: Fall Restrictions Weight Bearing Restrictions: No       Mobility Bed Mobility Overal bed mobility: Modified Independent                Transfers Overall transfer level: Needs assistance Equipment used: None Transfers: Sit to/from Stand;Stand Pivot Transfers Sit to Stand: Supervision Stand pivot transfers: Supervision            Balance Overall balance assessment: Needs assistance Sitting-balance support: Feet unsupported;No upper extremity supported Sitting balance-Leahy Scale: Good     Standing balance support: No upper extremity supported Standing balance-Leahy Scale: Fair                             ADL either performed or assessed with clinical judgement   ADL Overall ADL's : Needs assistance/impaired     Grooming: Oral care;Wash/dry face;Wash/dry hands;Brushing hair;Supervision/safety;Standing   Upper Body Bathing: Standing;Min guard   Lower Body Bathing: Min guard;Sit to/from stand   Upper Body Dressing : Minimal  assistance;Standing Upper Body Dressing Details (indicate cue type and reason): Pt with posterior LOB while standing to thread sleeves through her own gown (arms overhead) Lower Body Dressing: Min guard;Sit to/from stand   Toilet Transfer: Min guard;Ambulation;Comfort height toilet;Grab bars   Toileting- Clothing Manipulation and Hygiene: Min guard;Sit to/from stand       Functional mobility during ADLs: Minimal assistance;Min guard       Vision       Perception     Praxis      Cognition Arousal/Alertness: Awake/alert Behavior During Therapy: WFL for tasks assessed/performed Overall Cognitive Status: Within Functional Limits for tasks assessed                                          Exercises Other Exercises Other Exercises: Pt performed AROM shoulder x 10 in sitting; performed neck flex/ext x 5, rotation x 5, lateral flexion x 5    Shoulder Instructions       General Comments 02 sats mid 80s on RA.  No dyspnea noted.  sats increased to mid 90s on 2L supplemental 02    Pertinent Vitals/ Pain       Pain Assessment: Faces Faces Pain Scale: Hurts little more Pain Location: shoulders and neck Pain Descriptors / Indicators: Aching;Sore;Guarding Pain Intervention(s): Monitored during session  Home Living  Prior Functioning/Environment              Frequency  Min 2X/week        Progress Toward Goals  OT Goals(current goals can now be found in the care plan section)  Progress towards OT goals: Progressing toward goals     Plan Discharge plan remains appropriate    Co-evaluation                 AM-PAC PT "6 Clicks" Daily Activity     Outcome Measure   Help from another person eating meals?: None Help from another person taking care of personal grooming?: A Little Help from another person toileting, which includes using toliet, bedpan, or urinal?: A Little Help from  another person bathing (including washing, rinsing, drying)?: A Little Help from another person to put on and taking off regular upper body clothing?: A Little Help from another person to put on and taking off regular lower body clothing?: A Little 6 Click Score: 19    End of Session Equipment Utilized During Treatment: Oxygen  OT Visit Diagnosis: Unsteadiness on feet (R26.81);Muscle weakness (generalized) (M62.81);Pain   Activity Tolerance Patient tolerated treatment well   Patient Left in bed;with call bell/phone within reach   Nurse Communication Mobility status        Time: 0102-7253 OT Time Calculation (min): 30 min  Charges: OT General Charges $OT Visit: 1 Procedure OT Treatments $Self Care/Home Management : 23-37 mins  [facility], OTR/L [phone]    Lucille Passy M 03/21/2017, 4:13 PM

## 2017-03-21 NOTE — Progress Notes (Signed)
Physical Therapy Wound Treatment Patient Details  Name: Rachael Johnson MRN: [id number] Date of Birth: 01/12/1951  Today's Date: 03/21/2017 Time: 1025-1055 Time Calculation (min): 30 min  Subjective  Subjective: I don't feel as well today Patient and Family Stated Goals: I want to get home  Pain Score: Pain Score: 0-No pain  Wound Assessment  Wound / Incision (Open or Dehisced) 03/13/17 Non-pressure wound Shoulder Right;Posterior (Active)  Dressing Type ABD;Gauze (Comment);Moist to dry 03/21/2017 11:04 AM  Dressing Changed Changed 03/18/2017  9:53 AM  Dressing Status Clean;Dry;Intact 03/21/2017 11:04 AM  Dressing Change Frequency Daily 03/21/2017 11:04 AM  Site / Wound Assessment Brown;Yellow;Pink 03/21/2017 11:04 AM  % Wound base Red or Granulating 10% 03/21/2017 11:04 AM  % Wound base Yellow/Fibrinous Exudate 90% 03/21/2017 11:04 AM  % Wound base Black/Eschar 0% 03/21/2017 11:04 AM  % Wound base Other/Granulation Tissue (Comment) 0% 03/21/2017 11:04 AM  Peri-wound Assessment Erythema (blanchable) 03/21/2017 11:04 AM  Wound Length (cm) 4 cm 03/21/2017 11:04 AM  Wound Width (cm) 9 cm 03/21/2017 11:04 AM  Wound Depth (cm) 0.2 cm 03/21/2017 11:04 AM  Margins Unattached edges (unapproximated) 03/21/2017 11:04 AM  Closure None 03/21/2017 11:04 AM  Drainage Amount Minimal 03/21/2017 11:04 AM  Drainage Description Serosanguineous;Sanguineous 03/21/2017 11:04 AM  Non-staged Wound Description Partial thickness 03/21/2017 11:04 AM  Treatment Cleansed;Debridement (Selective);Hydrotherapy (Pulse lavage);Packing (Saline gauze) 03/21/2017 11:04 AM  Santyl applied to wound bed prior to applying dressing.    Hydrotherapy Pulsed lavage therapy - wound location: R posterior shoulder Pulsed Lavage with Suction (psi): 8 psi Pulsed Lavage with Suction - Normal Saline Used: 1000 mL Pulsed Lavage Tip: Tip with splash shield Selective Debridement Selective Debridement - Location: posterior- superior  shoulder Selective Debridement - Tools Used: Forceps;Scalpel Selective Debridement - Tissue Removed: yellow eschar, necrotic fascia and adipose   Wound Assessment and Plan  Wound Therapy - Assess/Plan/Recommendations Wound Therapy - Clinical Statement: pt can benefit from PLS to soften tenacious yellow eschar for selective debridement.  Expect wound to be relatively superficial. Wound Therapy - Functional Problem List: pt usually functional and independent Hydrotherapy Plan: Debridement;Dressing change;Patient/family education Wound Therapy - Frequency: 6X / week Wound Therapy - Follow Up Recommendations: Home health RN Wound Plan: see above  Wound Therapy Goals- Improve the function of patient's integumentary system by progressing the wound(s) through the phases of wound healing (inflammation - proliferation - remodeling) by: Decrease Necrotic Tissue to: 25% Decrease Necrotic Tissue - Progress: Goal set today Increase Granulation Tissue to: 75% Increase Granulation Tissue - Progress: Goal set today Goals/treatment plan/discharge plan were made with and agreed upon by patient/family: Yes Time For Goal Achievement: 7 days Wound Therapy - Potential for Goals: Good  Goals will be updated until maximal potential achieved or discharge criteria met.  Discharge criteria: when goals achieved, discharge from hospital, MD decision/surgical intervention, no progress towards goals, refusal/missing three consecutive treatments without notification or medical reason.  GP     [name] Eriyah Fernando 03/21/2017, 11:08 AM 03/21/2017  [name], [place] [phone]  (pager)

## 2017-03-21 NOTE — Consult Note (Addendum)
[facility] Nurse wound follow up Wound type: Right posterior shoulder has greatly improved since previous assessment by the [facility] team and PT performing hydrotherapy Q Mon-Sat.  Assessed at the bedside while they are preparing for therapy. Unstageable wound is 4X9cm, refer to PT notes for percentages.  Eschar is resolved, revealing yellow tissue with red surrounding the outer wound.  Hydrotherapy will not be able to remove much more of the dry yellow adipose after remaining slough is gone.  Discussed plan of care with PT; will plan for therapy to continue until Fri, then will discontinue this treatment and continue to use Santyl daily for enzymatic debridement.    Left shoulder with previously noted unstageable wound has evolved into a red, moist partial thickness wound with .8X.8cm remaining slough.  Discontinue Santyl and use foam dressing to protect and promote healing.    Head wound to the right side has resolved; patchy dry yellow scabbed areas remain. Middle head full thickness wound has decreased in size; 4X3cm with 90% tightly adhered yellow slough remaining, 10% red to edges with small amt bleeding, patchy dry yellow scabbed areas remain. Continue present plan of care with Santyl for enzymatic debridement.  All wounds were present on admission.    Dressing procedure/placement/frequency: Discussed plan of care with patient. Pt could benefit from continued follow-up from home health nurses for dressing change assistance after discharge; please order if desired.  Please re-consult if further assistance is needed.  Thank-you,  Julien Girt MSN, [place], [place], [place], [place]

## 2017-03-22 LAB — CBC
HCT: 29.2 % — ABNORMAL LOW (ref 36.0–46.0)
Hemoglobin: 9.3 g/dL — ABNORMAL LOW (ref 12.0–15.0)
MCH: 38.1 pg — ABNORMAL HIGH (ref 26.0–34.0)
MCHC: 31.8 g/dL (ref 30.0–36.0)
MCV: 119.7 fL — ABNORMAL HIGH (ref 78.0–100.0)
Platelets: 334 10*3/uL (ref 150–400)
RBC: 2.44 MIL/uL — ABNORMAL LOW (ref 3.87–5.11)
RDW: 18.1 % — ABNORMAL HIGH (ref 11.5–15.5)
WBC: 6.7 10*3/uL (ref 4.0–10.5)

## 2017-03-22 LAB — RENAL FUNCTION PANEL
Albumin: 2.3 g/dL — ABNORMAL LOW (ref 3.5–5.0)
Anion gap: 10 (ref 5–15)
BUN: 78 mg/dL — ABNORMAL HIGH (ref 6–20)
CO2: 29 mmol/L (ref 22–32)
Calcium: 8.1 mg/dL — ABNORMAL LOW (ref 8.9–10.3)
Chloride: 96 mmol/L — ABNORMAL LOW (ref 101–111)
Creatinine, Ser: 5.39 mg/dL — ABNORMAL HIGH (ref 0.44–1.00)
GFR calc Af Amer: 9 mL/min — ABNORMAL LOW (ref >60)
GFR calc non Af Amer: 8 mL/min — ABNORMAL LOW (ref >60)
Glucose, Bld: 93 mg/dL (ref 65–99)
Phosphorus: 5.7 mg/dL — ABNORMAL HIGH (ref 2.5–4.6)
Potassium: 4.1 mmol/L (ref 3.5–5.1)
Sodium: 135 mmol/L (ref 135–145)

## 2017-03-22 LAB — MAGNESIUM: Magnesium: 1.7 mg/dL (ref 1.7–2.4)

## 2017-03-22 MED ORDER — ADULT MULTIVITAMIN LIQUID CH
15.0000 mL | Freq: Every morning | ORAL | Status: DC
  Administered 2017-03-22 – 2017-03-23 (×2): 15 mL via ORAL
  Filled 2017-03-22 (×2): qty 15

## 2017-03-22 NOTE — Progress Notes (Signed)
Subjective: Interval History: has complaints wound sore.  Objective: Vital signs in last 24 hours: Temp:  [97.8 F (36.6 C)-98.2 F (36.8 C)] 98.2 F (36.8 C) (08/23 0423) Pulse Rate:  [77-87] 87 (08/23 0423) Resp:  [14-18] 18 (08/23 0423) BP: (106-139)/(63-79) 139/79 (08/23 0423) SpO2:  [97 %-100 %] 97 % (08/23 0423) Weight:  [68.9 kg (151 lb 14.4 oz)] 68.9 kg (151 lb 14.4 oz) (08/23 0419) Weight change: -1.86 kg (-4 lb 1.6 oz)  Intake/Output from previous day: 08/22 0701 - 08/23 0700 In: 820 [P.O.:720; IV Piggyback:100] Out: 1850 [Urine:1850] Intake/Output this shift: Total I/O In: -  Out: 500 [Urine:500]  General appearance: alert, cooperative, no distress and pale Resp: rales bibasilar Cardio: S1, S2 normal and systolic murmur: systolic ejection 2/6, decrescendo at 2nd left intercostal space GI: soft, pos bs Extremities: 2+ edema  Lab Results:  Recent Labs  03/21/17 0226 03/22/17 0400  WBC 8.5 6.7  HGB 9.8* 9.3*  HCT 30.1* 29.2*  PLT 341 334   BMET:  Recent Labs  03/21/17 0226 03/22/17 0400  NA 139 135  K 3.7 4.1  CL 97* 96*  CO2 29 29  GLUCOSE 105* 93  BUN 87* 78*  CREATININE 6.22* 5.39*  CALCIUM 8.1* 8.1*   No results for input(s): PTH in the last 72 hours. Iron Studies:  Recent Labs  03/20/17 1048  IRON 27*  TIBC 171*    Studies/Results: Dg Chest Port 1 View  Result Date: 03/21/2017 CLINICAL DATA:  Shortness of breath . EXAM: PORTABLE CHEST 1 VIEW COMPARISON:  03/12/2017. FINDINGS: Mild cardiomegaly with mild bilateral pulmonary interstitial prominence. Mild CHF cannot be excluded. Pneumonitis cannot be excluded. Mild right mid lung field and bibasilar subsegmental atelectasis No pleural effusion or pneumothorax . No pneumothorax. IMPRESSION: 1. Mild cardiomegaly with mild bilateral interstitial prominence. Mild CHF cannot be excluded. Mild pneumonitis cannot be excluded. 2. Low lung volumes. Mild right mid lung field and bibasilar  subsegmental atelectasis. Electronically Signed   By: Marcello Moores  Register   On: 03/21/2017 09:13    I have reviewed the patient's current medications.  Assessment/Plan: 1 AKI rhabdo  Improving , GFR still low.  If Cr <5 , ok to D/C tomorrow. Still vol xs but diruesing 2 Anemia 3 Wounds per WC 4 Debill P allow to diurese, follow chem , mobilize   LOS: 9 days   Ophelia Sipe L 03/22/2017,11:48 AM

## 2017-03-22 NOTE — Progress Notes (Signed)
PROGRESS NOTE    Rachael Johnson  VVO:[id number] DOB: June 13, 1951 DOA: 03/12/2017 PCP: Mosie Lukes, MD (Confirm with patient/family/NH records and if not entered, this HAS to be entered at [facility] point of entry. "No PCP" if truly none.)   Brief Narrative: (Start on day 1 of progress note - keep it brief and live) Rachael D Wilsonis a 66 y.o.femalewith medical history significant fordepression, anxiety, GERD, hypertension, and hypothyroidism, who presenting to the emergency department after being found down by her family. History was initially obtained through discussion with the ED personnel, patient's family, and review of the EMR. Family had reportedly spoken with the patient at approximately 5:30 PM on 03/11/2017 and she seemed to be in her usual state at that time. After that, she had not been answering her phone and family went to check on her tonight. They found her on the floor, complaining of pain in her chest, abdomen, and legs. She was brought into the ED for evaluation. She was unable to provide a history as to how she ended up on the floor.  On further discussion, it seems that she bent over and then something hit her head (she mentioned possibly a vase). There is no history of similar presentation per family report. She is not known to use alcohol or illicit substances. She was found to have Rhabdomyolysis and and AKI as well as elevated Troponin's.   Cardiology and Nephrology wereconsulted. Patient's CK has been improving and normalized on 8/20 at 158.  Cr worsened and peaked at 7.18 on 8/19. Per Nephro no indication for Dialysis and gave Lasix. Neprho recommended D/Cing IVF to allow Auto Diruresis  Patient states she is still sore but feels as if she is improving. Patient's hospital course wa complicated byHb/Hct droppinso patient was ordered 1 unit of pRBC's to be transfused (8/19). Hb/Hct improved after Transfusion and Lasix IV 20 mg.   Patient is slowly improving and Hb/Hct  has been stable. Nurse stated patient had odor to her urine today but patient is Afebrile and has no Leukocytosis. Will have Nurse D/C foley and obtain U/A. Patient was complaining of mouth soreness and magic mouthwash with lidocaine was ordered.  Yesterday with UA with large LE, but negative nitrite.  She had many squams.  Will recollect today.  CXR ordered due to crackles and SOB she notes similar to COPD flares at home.  Albuterol to bedside.    This morning doing ok.  With some SOB she attributes to COPD, she's been on and off the oxygen.  Creatinine steadily improving.  Possibly d/c tomorrow.    Assessment & Plan:   Principal Problem:   Rhabdomyolysis Active Problems:   Essential thrombocythemia ([place])   Hypothyroid   Depression with anxiety   Macrocytic anemia   Restless leg syndrome   Essential hypertension   AKI (acute kidney injury) ([place])   LFTs abnormal   Elevated troponin   Hypernatremia   Elevated LFTs   Sore mouth   Scalp wound   Back wound   Cardiac enzymes elevated   Hypomagnesemia   Acute Rhabdomyolysis; improved - Initially found down by family (last contacted her a day earlier).  Today she states that she thinks she was hit in the head when bending over to get something.   - Serum CK elevated to 32,000 on admission and now normalized and improved to 158 (8/20). - She was fluid-resuscitated in ED with 1 liter NS and 2 liters LR  - IVF now D/C'd per  Nephro Rec's  - Initial Urinalysis showes Large Hgb but 0-5 RBC/HPF; most recent UA with small Hb and 0-5 RBC (repeating UA today for dysuria and squams on UA) - Nephrology Consulted and appreciated Recc's - PT Consulted and did Hydrotherapy as well as regular Therapy  AKI/ Acute Renal Failure with Anuria/Oliguria 2/2 to Acute Rhabdomyolysis, improving - SCr is 2.97 on admission, previously wnl and now BUN/Cr worsened from to98/7.18 ->and now trending down at 87/6.22 (most recent nephrology recs, pt ok to d/c  when creatinine <5)  - She was clinically dehydrated and was treated with 3 liters IVF in ED and on Maintenance IVF with 110 of NS/hr changed to IV Sodium Bicarb gtt with 150 mEQ at 75 mL/hr on 03/15/17 and then changed backto NS at 75 mL/hr on 03/18/17 and IVF D/C'd 03/19/17 - Patient had also been taking NSAIDS (Naproxen for Arthritis)  - CT Abd/Pelvis showed punctate non-obstructing stones in the kidneys which was negative for Hydronephrosis or Ureteral Stone. Scattered sigmoid colon diverticula without acute inflammation and Mild Right LLL Subpleural interstitial density and nodulary that could reflect small airways infection or possible atypical PNA  - Avoid nephrotoxins, renally-dose medications, repeat chem panel yesterday - Foley now removed; voiding well   - Nephrology tried1x dose of IV Lasix 40 mg and was given 03/16/17 and one time dose of IV 20 Lasix given after patient was transfused 1 unit of pRBC; Defer further Lasix Administration to Nephrology - Obtained Renal U/S and showed non-obstructing Right nephrolithiasis and no Hydronephrosis. There was Trace Right perinephric Fluid which was nonspecific. - Nephrology Consulted and appreciated Recc's; No Dialysis indicated at this time and recommend D/C'ing IVF.   Metabolic Acidosis Likely 2/2 to Acute Rhabdomyolysis, improved -normalized -Nephrology Following  -Repeat CMP ion AM   Hyperphosphatemia, worsening -Likely from Renal Failure -Phos Level went from 4.5 ->6.9 -> improved today to 5.7 -Added Sevelamer Carbonate 1600 mg po TIDwm  -Continue to Monitor and repeat Phos Level in AM  Elevated Troponin, trending down - Troponin is elevated to 21.50 on arrival and now Trended down to0.18 (8/20) - No hx of CAD, no anginal complaints, EKG without acute ischemic features  - C/w NTG 0.4 mg SL q61min x3 pRN - Patient was placed on Heparin gtt and Troponin Trending down; Heparin gtt now D/C'd - Per Cards will need Ischemic Workup  but will wait until Renal Fxn Stablizes and likely can be done as an outpatient  - ECHOCardiogram done showed Systolic function was normal. The estimated ejection fraction was in the range of 55% to 60%. Wall motion was normal; there were no regional wall motion abnormalities. Features areconsistent with a pseudonormal left ventricular filling pattern,with concomitant abnormal relaxation and increased filling pressure (grade 2 diastolic dysfunction). - Cardiology consulted and appreciated; Cards Signed off [ ]  touch base with cards to plan follow up   Abnormal LFT's / Elevated Transaminases likely from Rhabdomyolysis, improving - AST is 876, ALT 201 on Admission and trended down to 26and 47respectively  - Abd was tender on admission and CT abd/pelvis non-revealing  - Likely secondary to Rhabdomyolysis - Repeat CMP in AM   Hypernatremia, improved  - IVF Discontinued as Above  - Repeat chem panel in am   Depression, Anxiety  - C/w Buspirone 10 mg po TID, Citalopram 20 mg po Daily, Amitriptyline 20 mg qHS, and Venlafaxine XR 75 mg po Daily  - C/w Alprazolam 1 mg po TID prn Anxiety   Hypothyroidism  - TSH  on Admission was 2.683 - Continue Levothyroxine 88 mcg po Daily   GERD/Esophageal Reflex -C/w Famotidine 20 mg po qHS and with Pantoprazole 40 mg po BID  Restless Leg Syndrome -C/w Pramipexole 0.25-0.5 mg po qHS daily prn  Mouth Soreness with Mouth Blisters -Added Phenol Mouth Spray 1 spran prn yesterday and patient had relief -Added Magic Mouthwash with Lidocaine 5 mL QID -Consider adding Cepacol if not improving   Hx of Smoking/COPD/Asthma - Still with sob which seems most consistent with her COPD, though CXR notable for mild cardiomegaly, cant exclude mild CHF (vs pneumonitis).  Will ctm for now as seems to be diuresising well, could consider attempt at diuresis if persistent in future (grade 2 diastolic dysfunction as above) - add on albuterol q4 hrs prn (discussed  with RT who will get this bedside for her - she had some crackles in the lower lobes bilaterally [ ]  f/u CXR and order IS -C/w Mometasone-Formoterol 2 puff IH BID -C/w Montelukast 10 mg po qHS  Back and Scalp Wounds -WOC Nurse appreciated -C/w Bacitracin ointment BID for Scalp and Collagenase Posterior Shoulder and Scalp Wound -PT to do Hydrotherapy and Regular Therapy  Essential Thrombocytosis, improved -Platelet Count went from 440 ->339 -Continue to Monitor CBC daily  Macrocytic Anemia -Patient's Hb/Hct went from dropped to 7.7/23.1; Patient was transfused 1 unit of pRBC's and Hb/Hct improved to 10.9/32.5; Hb/Hct this AM was now 9.3 -Vitamin B12 was 230 and Folate Hemolysate was 491.7 and Folate RBDC was 1401 [ ]  will follow up MMA given borderline B12 -Likely contributed 2/2 to Dilutional Drop from IVF  -Continue to Monitor for S/Sx of bleeding; Patient no longer on Heparin gtt -Repeat CBC in AM  Dysuria  Pyuria - due to foley? She had many squams on UA, will repeat and f/u culture as needed - UA and culture pending  Hypomagnesemia -Patient's Mag Level this AM was 1.7 -Continue to Montior and Replete as Necessary -Repeat Mag Level in AM   Constipation - home lactulose BID  Widening of left C3-4 facet with joint space irregularity - suggestive of inflammatory/degenerative arthropathy, consider MRI in f/u if hx of neck pain  Prolonged QTc - cardiology rec considering reducing celexa dosing, reduced to 20 mg. Improved to 430   DVT prophylaxis: heparin Code Status: full Family Communication:none Disposition Plan: tomorrow?   Consultants:   nephrology  Procedures: (Don't include imaging studies which can be auto populated. Include things that cannot be auto populated i.e. Echo, Carotid and venous dopplers, Foley, Bipap, HD, tubes/drains, wound vac, central lines etc)  8/15 Renal US with nonobstructive R nephrolithiasis without hydronephrosis, trace  perinephric fluid  8/14 Lumbar/thoracic spine plain film without evidence of fx or subluxation  8/14 CXR without acute cardiopulm process  8/14 CT head/c spine without acute intracranial abnormality, widening of LC3-4 facet with joint space irregularity, LUL ground glass opacities (consider atypical infxn vs pulm edema)  8/14 CT abdomen pelvis with nonobstructing stones, diverticula without acute inflamation, R lower lobe subpleural interstitial density and nodularity "small airways infection or possible atypical pneumonia"  Antimicrobials: (specify start and planned stop date. Auto populated tables are space occupying and do not give end dates)  none    Subjective: Feeling ok.  Some SOB she notes similar to COPD.  No CP.  No fevers, chills.   Objective: Vitals:   03/21/17 2129 03/21/17 2235 03/22/17 0419 03/22/17 0423  BP: 125/72 126/71  139/79  Pulse: 83   87  Resp: 17  18  Temp: 98.1 F (36.7 C)   98.2 F (36.8 C)  TempSrc: Oral   Oral  SpO2: 100%   97%  Weight:   68.9 kg (151 lb 14.4 oz)   Height:        Intake/Output Summary (Last 24 hours) at 03/22/17 1100 Last data filed at 03/22/17 0733  Gross per 24 hour  Intake              580 ml  Output             2050 ml  Net            -1470 ml   Filed Weights   03/20/17 0534 03/21/17 0209 03/22/17 0419  Weight: 73.3 kg (161 lb 8 oz) 70.8 kg (156 lb) 68.9 kg (151 lb 14.4 oz)    Examination:  General exam: Appears calm and comfortable  Respiratory system: Crackles to bilateral bases.   No wheezing noted. Cardiovascular system: S1 & S2 heard, RRR. JVD to angle of jaw. murmurs, rubs, gallops or clicks. 2+ LEE.  Gastrointestinal system: Abdomen is nondistended, soft and nontender. No organomegaly or masses felt. Normal bowel sounds heard. Central nervous system: Alert and oriented. No focal neurological deficits. Extremities: Symmetric 5 x 5 power. Skin: No rashes, lesions or ulcers Psychiatry: Judgement and insight  appear normal. Mood & affect appropriate.     Data Reviewed: I have personally reviewed following labs and imaging studies  CBC:  Recent Labs Lab 03/17/17 0359 03/18/17 0236 03/18/17 1407 03/19/17 0135 03/20/17 0240 03/21/17 0226 03/22/17 0400  WBC 7.4 7.6  --  5.6 5.9 8.5 6.7  NEUTROABS 6.1 6.1  --  4.2 4.6 7.1  --   HGB 9.4* 7.7* 10.9* 9.7* 9.6* 9.8* 9.3*  HCT 28.7* 23.1* 32.5* 28.7* 28.8* 30.1* 29.2*  MCV 125.3* 122.2*  --  114.3* 116.6* 117.6* 119.7*  PLT 361 395  --  317 339 341 962   Basic Metabolic Panel:  Recent Labs Lab 03/17/17 0359 03/18/17 0236 03/19/17 0135 03/20/17 0240 03/21/17 0226 03/22/17 0400  NA 138 136 137 137  136 139 135  K 4.1 3.7 3.7 3.9  3.9 3.7 4.1  CL 98* 93* 93* 95*  95* 97* 96*  CO2 24 30 30 28  29 29 29   GLUCOSE 95 125* 94 90  93 105* 93  BUN 96* 98* 99* 96*  98* 87* 78*  CREATININE 6.75* 7.18* 7.13* 6.76*  6.87* 6.22* 5.39*  CALCIUM 7.8* 7.5* 7.4* 7.8*  7.8* 8.1* 8.1*  MG 1.8 1.6* 1.9 2.0 1.8  --   PHOS 6.1* 5.9* 6.6* 6.9*  6.9* 6.0* 5.7*   GFR: Estimated Creatinine Clearance: 9.7 mL/min (A) (by C-G formula based on SCr of 5.39 mg/dL (H)). Liver Function Tests:  Recent Labs Lab 03/17/17 0359 03/18/17 0236 03/19/17 0135 03/20/17 0240 03/21/17 0226 03/22/17 0400  AST 81* 49* 34 26 26  --   ALT 99* 72* 55* 47 43  --   ALKPHOS 85 84 90 102 106  --   BILITOT 0.6 0.5 0.6 0.5 0.6  --   PROT 5.6* 5.0* 5.0* 5.4* 5.9*  --   ALBUMIN 2.4* 2.1* 2.1* 2.1*  2.2* 2.4* 2.3*   No results for input(s): LIPASE, AMYLASE in the last 168 hours. No results for input(s): AMMONIA in the last 168 hours. Coagulation Profile: No results for input(s): INR, PROTIME in the last 168 hours. Cardiac Enzymes:  Recent Labs Lab 03/16/17 0223 03/17/17 0359 03/18/17  0950 03/19/17 0135  CKTOTAL 1,059* 482* 232 158  CKMB 31.1* 17.1* 16.1* 6.2*  TROPONINI  --  0.51*  --  0.18*   BNP (last 3 results) No results for input(s): PROBNP in the  last 8760 hours. HbA1C: No results for input(s): HGBA1C in the last 72 hours. CBG: No results for input(s): GLUCAP in the last 168 hours. Lipid Profile: No results for input(s): CHOL, HDL, LDLCALC, TRIG, CHOLHDL, LDLDIRECT in the last 72 hours. Thyroid Function Tests: No results for input(s): TSH, T4TOTAL, FREET4, T3FREE, THYROIDAB in the last 72 hours. Anemia Panel:  Recent Labs  03/20/17 1048  TIBC 171*  IRON 27*   Sepsis Labs: No results for input(s): PROCALCITON, LATICACIDVEN in the last 168 hours.  No results found for this or any previous visit (from the past 240 hour(s)).       Radiology Studies: Dg Chest Port 1 View  Result Date: 03/21/2017 CLINICAL DATA:  Shortness of breath . EXAM: PORTABLE CHEST 1 VIEW COMPARISON:  03/12/2017. FINDINGS: Mild cardiomegaly with mild bilateral pulmonary interstitial prominence. Mild CHF cannot be excluded. Pneumonitis cannot be excluded. Mild right mid lung field and bibasilar subsegmental atelectasis No pleural effusion or pneumothorax . No pneumothorax. IMPRESSION: 1. Mild cardiomegaly with mild bilateral interstitial prominence. Mild CHF cannot be excluded. Mild pneumonitis cannot be excluded. 2. Low lung volumes. Mild right mid lung field and bibasilar subsegmental atelectasis. Electronically Signed   By: Marcello Moores  Register   On: 03/21/2017 09:13        Scheduled Meds: . amitriptyline  20 mg Oral QHS  . aspirin EC  81 mg Oral Daily  . bacitracin   Topical BID  . busPIRone  10 mg Oral TID  . citalopram  20 mg Oral q morning - 10a  . collagenase   Topical Daily  . cycloSPORINE  1 drop Both Eyes BID  . famotidine  20 mg Oral QHS  . fluticasone  2 spray Each Nare Daily  . heparin subcutaneous  5,000 Units Subcutaneous Q8H  . lactulose  20 g Oral BID  . levothyroxine  88 mcg Oral QAC breakfast  . loratadine  10 mg Oral Daily  . magic mouthwash w/lidocaine  5 mL Oral QID  . mometasone-formoterol  2 puff Inhalation BID  .  montelukast  10 mg Oral QHS  . multivitamin  15 mL Oral q morning - 10a  . pantoprazole  40 mg Oral BID  . pramipexole  0.25-0.5 mg Oral QHS  . sevelamer carbonate  1,600 mg Oral TID WC  . venlafaxine XR  75 mg Oral Q breakfast   Continuous Infusions:   LOS: 9 days    Time spent: 21    A Melven Sartorius, MD [facility] Pager [phone]  If 7PM-7AM, please contact night-coverage [email or web address] Password [email or web address] 03/22/2017, 11:00 AM

## 2017-03-22 NOTE — Progress Notes (Signed)
Physical Therapy Treatment Patient Details Name: LEATHA ROHNER MRN: [id number] DOB: [birth year] Today's Date: 03/22/2017    History of Present Illness pt is a 66 y/o female with pmh significant for depession, anxiety, HTN, seizures, macrocytic anemia, RLS, admitted after being found down in her home (suspected on floor as much as 24 hours) sustaining multiple abrasions.  Work up includes acute rhabdomyolysis, AKI, hypernatremia due to dehydration.    PT Comments    Pt improving steadily.  Today emphasis on moving away from the RW, challenging balance and completing stairs   Follow Up Recommendations  Home health PT;Supervision/Assistance - 24 hour     Equipment Recommendations  None recommended by PT    Recommendations for Other Services       Precautions / Restrictions      Mobility  Bed Mobility Overal bed mobility: Modified Independent Bed Mobility: Supine to Sit;Sit to Supine              Transfers Overall transfer level: Needs assistance   Transfers: Sit to/from Stand Sit to Stand: Supervision         General transfer comment: cues for hand placement.   Ambulation/Gait Ambulation/Gait assistance: Min assist;Min guard Ambulation Distance (Feet): 350 Feet Assistive device: None;1 person hand held assist ( rail) Gait Pattern/deviations: Step-through pattern;Scissoring;Drifts right/left Gait velocity: speed increase each day. Gait velocity interpretation: Below normal speed for age/gender General Gait Details: mildly unsteady overall without use of the RW today.  IHand hold initially and less assist mildly more steady with more distance, variable speed and increased confidence.   Stairs Stairs: Yes   Stair Management: One rail Left;Alternating pattern;Forwards Number of Stairs: 3 General stair comments: mildly uncoordinated on steps, but safe enough with rail  Wheelchair Mobility    Modified Rankin (Stroke Patients Only)       Balance Overall  balance assessment: Needs assistance Sitting-balance support: Feet unsupported;No upper extremity supported Sitting balance-Leahy Scale: Good     Standing balance support: No upper extremity supported Standing balance-Leahy Scale: Fair Standing balance comment: no external support for static standing.  Still needed RW for confidence dynamically                            Cognition Arousal/Alertness: Awake/alert Behavior During Therapy: WFL for tasks assessed/performed Overall Cognitive Status: Within Functional Limits for tasks assessed                                        Exercises      General Comments General comments (skin integrity, edema, etc.): sats 94% on 2L Ward during ambulation      Pertinent Vitals/Pain Pain Assessment: Faces Faces Pain Scale: Hurts even more Pain Location: shoulders and neck Pain Descriptors / Indicators: Aching;Sore;Guarding    Home Living                      Prior Function            PT Goals (current goals can now be found in the care plan section) Acute Rehab PT Goals Patient Stated Goal: home independent PT Goal Formulation: With patient Time For Goal Achievement: 03/30/17 Potential to Achieve Goals: Good Progress towards PT goals: Progressing toward goals    Frequency    Min 3X/week      PT Plan Current plan remains appropriate  Co-evaluation              AM-PAC PT "6 Clicks" Daily Activity  Outcome Measure  Difficulty turning over in bed (including adjusting bedclothes, sheets and blankets)?: None Difficulty moving from lying on back to sitting on the side of the bed? : None Difficulty sitting down on and standing up from a chair with arms (e.g., wheelchair, bedside commode, etc,.)?: None Help needed moving to and from a bed to chair (including a wheelchair)?: A Little Help needed walking in hospital room?: A Little Help needed climbing 3-5 steps with a railing? : A  Little 6 Click Score: 21    End of Session Equipment Utilized During Treatment: Oxygen Activity Tolerance: Patient tolerated treatment well Patient left: in bed;with call bell/phone within reach Nurse Communication: Mobility status PT Visit Diagnosis: Unsteadiness on feet (R26.81);Other abnormalities of gait and mobility (R26.89);Pain Pain - part of body: Shoulder     Time: 1000-1024 PT Time Calculation (min) (ACUTE ONLY): 24 min  Charges:  $Gait Training: 8-22 mins $Therapeutic Activity: 8-22 mins                    G Codes:       04-13-2017  [name], PT [phone]  (pager)   [name] Macallister Ashmead 2017-04-13, 1:48 PM

## 2017-03-22 NOTE — Progress Notes (Signed)
[place] Therapy Wound Treatment Patient Details  Name: GREENLY RARICK MRN: [id number] Date of Birth: 1951-01-28  Today's Date: 03/22/2017 Time: 7673-4193 Time Calculation (min): 37 min  Subjective     Pain Score:  up to 6/10  Wound Assessment  Wound / Incision (Open or Dehisced) 03/13/17 Non-pressure wound Shoulder Right;Posterior (Active)  Dressing Type ABD;Gauze (Comment);Moist to dry 03/22/2017  8:00 AM  Dressing Changed Changed 03/18/2017  9:53 AM  Dressing Status Clean;Dry;Intact 03/22/2017  8:00 AM  Dressing Change Frequency Daily 03/22/2017  8:00 AM  Site / Wound Assessment Dressing in place / Unable to assess 03/22/2017  8:00 AM  % Wound base Red or Granulating 10% 03/22/2017  8:00 AM  % Wound base Yellow/Fibrinous Exudate 90% 03/22/2017  8:00 AM  % Wound base Black/Eschar 0% 03/22/2017  8:00 AM  % Wound base Other/Granulation Tissue (Comment) 0% 03/22/2017  8:00 AM  Peri-wound Assessment Erythema (blanchable) 03/22/2017  8:00 AM  Wound Length (cm) 4 cm 03/21/2017 11:04 AM  Wound Width (cm) 9 cm 03/21/2017 11:04 AM  Wound Depth (cm) 0.2 cm 03/21/2017 11:04 AM  Margins Unattached edges (unapproximated) 03/22/2017  8:00 AM  Closure None 03/22/2017  8:00 AM  Drainage Amount Minimal 03/22/2017  8:00 AM  Drainage Description Serosanguineous;Sanguineous 03/22/2017  8:00 AM  Non-staged Wound Description Partial thickness 03/22/2017  8:00 AM  Treatment Cleansed;Debridement (Selective);Hydrotherapy (Pulse lavage);Packing (Saline gauze) 03/21/2017 11:04 AM  Santyl applied to wound bed prior to applying dressing.        Wound Assessment and Plan     Wound Therapy Goals- Improve the function of patient's integumentary system by progressing the wound(s) through the phases of wound healing (inflammation - proliferation - remodeling) by:    Goals will be updated until maximal potential achieved or discharge criteria met.  Discharge criteria: when goals achieved, discharge from hospital, MD  decision/surgical intervention, no progress towards goals, refusal/missing three consecutive treatments without notification or medical reason.  GP     [name] Tanylah Schnoebelen 03/22/2017, 1:35 PM 03/22/2017  [name], [place] [phone]  (pager)

## 2017-03-22 NOTE — Progress Notes (Signed)
RT gave patient [facility] inhaler.  Patient stated she normally takes Symbicort.  RT provided education on [facility] inhaler. Patient tolerated inhaler well.

## 2017-03-23 ENCOUNTER — Other Ambulatory Visit: Payer: Self-pay | Admitting: Family Medicine

## 2017-03-23 DIAGNOSIS — N390 Urinary tract infection, site not specified: Secondary | ICD-10-CM

## 2017-03-23 LAB — RENAL FUNCTION PANEL
Albumin: 2.4 g/dL — ABNORMAL LOW (ref 3.5–5.0)
Anion gap: 12 (ref 5–15)
BUN: 66 mg/dL — ABNORMAL HIGH (ref 6–20)
CO2: 27 mmol/L (ref 22–32)
Calcium: 8.5 mg/dL — ABNORMAL LOW (ref 8.9–10.3)
Chloride: 98 mmol/L — ABNORMAL LOW (ref 101–111)
Creatinine, Ser: 4.49 mg/dL — ABNORMAL HIGH (ref 0.44–1.00)
GFR calc Af Amer: 11 mL/min — ABNORMAL LOW (ref >60)
GFR calc non Af Amer: 9 mL/min — ABNORMAL LOW (ref >60)
Glucose, Bld: 92 mg/dL (ref 65–99)
Phosphorus: 4.6 mg/dL (ref 2.5–4.6)
Potassium: 3.4 mmol/L — ABNORMAL LOW (ref 3.5–5.1)
Sodium: 137 mmol/L (ref 135–145)

## 2017-03-23 LAB — CBC
HCT: 30.9 % — ABNORMAL LOW (ref 36.0–46.0)
Hemoglobin: 9.9 g/dL — ABNORMAL LOW (ref 12.0–15.0)
MCH: 38.5 pg — ABNORMAL HIGH (ref 26.0–34.0)
MCHC: 32 g/dL (ref 30.0–36.0)
MCV: 120.2 fL — ABNORMAL HIGH (ref 78.0–100.0)
Platelets: 410 10*3/uL — ABNORMAL HIGH (ref 150–400)
RBC: 2.57 MIL/uL — ABNORMAL LOW (ref 3.87–5.11)
RDW: 18.3 % — ABNORMAL HIGH (ref 11.5–15.5)
WBC: 6.3 10*3/uL (ref 4.0–10.5)

## 2017-03-23 LAB — MAGNESIUM: Magnesium: 1.7 mg/dL (ref 1.7–2.4)

## 2017-03-23 MED ORDER — POTASSIUM CHLORIDE CRYS ER 20 MEQ PO TBCR
20.0000 meq | EXTENDED_RELEASE_TABLET | Freq: Once | ORAL | Status: AC
  Administered 2017-03-23: 20 meq via ORAL
  Filled 2017-03-23: qty 1

## 2017-03-23 MED ORDER — POTASSIUM CHLORIDE CRYS ER 20 MEQ PO TBCR: 20.0000 meq | EXTENDED_RELEASE_TABLET | Freq: Once | ORAL | Status: DC

## 2017-03-23 MED ORDER — CEPHALEXIN 500 MG PO CAPS: 500.0000 mg | ORAL_CAPSULE | Freq: Two times a day (BID) | ORAL | 0 refills | Status: AC

## 2017-03-23 MED ORDER — DEXTROSE 5 % IV SOLN
1.0000 g | INTRAVENOUS | Status: DC
  Administered 2017-03-23: 1 g via INTRAVENOUS
  Filled 2017-03-23: qty 10

## 2017-03-23 MED ORDER — BUSPIRONE HCL 10 MG PO TABS: 10.0000 mg | ORAL_TABLET | Freq: Three times a day (TID) | ORAL | 0 refills | Status: DC

## 2017-03-23 MED ORDER — COLLAGENASE 250 UNIT/GM EX OINT: TOPICAL_OINTMENT | Freq: Every day | CUTANEOUS | 0 refills | Status: DC

## 2017-03-23 MED ORDER — CITALOPRAM HYDROBROMIDE 40 MG PO TABS: 20.0000 mg | ORAL_TABLET | Freq: Every morning | ORAL | 0 refills | Status: DC

## 2017-03-23 NOTE — Care Management Note (Signed)
Case Management Note Original note by:  Marvetta Gibbons RN, BSN  Unit 4E-Case Manager [phone]  Patient Details  Name: Rachael Johnson MRN: [id number] Date of Birth: 16-Apr-1951  Subjective/Objective:  Pt admitted s/p fall at home with rhabdo                   Action/Plan: PTA pt lived at home alone-  Has supportive family nearby- referral received for Symbicort needs- spoke with pt at bedside- per conversation pt states that she has Medicare coverage for medications- however her Symbicort is not covered under her plan- and her copay is $143- she states she can not afford this- there is no coupon that she can use as it is only for commercially insured. She is followed by Dr. Melvyn Novas and reports that she has spoken with him but he wants her on this drug- have encouraged pt to have another conversation with DR. Wert regarding that fact that she can not afford this drug- also to see what drug is on her plan that is covered and see if he would be ok with an alternate drug that is covered that she can afford. Pt would not be eligible for pt assistance. -  Pt reports that she is independent - still drives and takes care of herself. She has supportive family that assist as needed. CM to follow for any further needs.   Expected Discharge Date:    824/18              Expected Discharge Plan:  [place]  In-House Referral:     Discharge planning Services  CM Consult, Medication Assistance  Post Acute Care Choice:  Home Health Choice offered to:  Patient  DME Arranged:  3-N-1 DME Agency:  [place]:  RN, PT, OT [facility] Agency:  [place]  Status of Service:  Completed, signed off  If discussed at [place] of Stay Meetings, dates discussed: 8/21, 8/23    Discharge Disposition:   Additional Comments:  03/21/17- 1700- Valentina Gu, CM- pt continues to may progress, renal function improving,  Still having hydrotherapy on shoulder per  PT- recommendations for HHPT/OT and 3n1 if shoulder continues to improve with hydrotherapy otherwise may need STSNF- CM will continue to follow.   03/23/17 1702 J. Chaddrick Brue, RN, BSN Pt medically stable for discharge home today.  PT/OT recommending HH follow up and MD ordering wound care, per PT recommendations.  Spoke with pt to discuss [facility] choice; pt wishes to use [facility] for [facility] needs, as her husband has used in the past.  Referral to [facility] for [facility] needs.  Verified address and phone #s in computer.  3 in 1 BSC ordered for home, per OT recommendations.  Pt states she will call her sister to come pick her up; she has supportive family that can assist at discharge.    Reinaldo Raddle, RN, BSN  Trauma/Neuro ICU Case Manager [phone]

## 2017-03-23 NOTE — Care Management Important Message (Signed)
Important Message  Patient Details  Name: Rachael Johnson MRN: [id number] Date of Birth: August 30, 1950   Medicare Important Message Given:  Yes    Jenna Routzahn Abena 03/23/2017, 11:21 AM

## 2017-03-23 NOTE — Discharge Summary (Addendum)
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 reconstructions of the cervical spine were also generated. COMPARISON:  Head and cervical spine CT 07/18/2015 FINDINGS: CT HEAD FINDINGS Brain: Generalized cerebral atrophy.No intracranial hemorrhage, mass effect, or midline shift. No hydrocephalus. The basilar cisterns are patent. No evidence of territorial infarct. No extra-axial or intracranial fluid collection. Vascular: Atherosclerosis of skullbase vasculature without hyperdense vessel or abnormal  calcification. Skull: No skull fracture.  No focal lesion. Sinuses/Orbits: Mucosal thickening of scattered ethmoid air cells. Fluid level in right and left sphenoid sinus and right maxillary sinus. Mastoid air cells are well-aerated. Visualized orbits are unremarkable. Other: None. CT CERVICAL SPINE FINDINGS Alignment: Trace anterolisthesis of C3 on C4 is unchanged from prior exam. There is widening and irregularity of the left C3-C4 facet that is new from prior exam but  appears chronic. No traumatic subluxation. Skull base and vertebrae: No acute fracture. Vertebral body heights are maintained. Skull base and dens are intact. Soft tissues and spinal canal: No prevertebral fluid or swelling. No visible canal hematoma. Disc levels: Disc space narrowing and endplate spurring at E5-I7 and to a lesser extent C4-C5. Upper chest: A few scattered ground-glass opacities at the left greater than right lung apex, new from chest CT 01/11/2017 Other: None. IMPRESSION: 1.  No acute intracranial abnormality.  No skull fracture. 2. No acute fracture or subluxation of the cervical spine. 3. Widening of the left C3-C4 facet with associated joint space irregularity suggests inflammatory or degenerative arthropathy. This does not appear traumatic. MRI could be considered if there is history of neck pain. 4. Nonspecific left upper lobe ground-glass opacities, atypical infection versus pulmonary edema. Electronically Signed   By: Jeb Levering M.D.   On: 03/13/2017 01:00   Ct Cervical Spine Wo Contrast  Result Date: 03/13/2017 CLINICAL DATA:  Altered level of consciousness.  Patient found down. EXAM: CT HEAD WITHOUT CONTRAST CT CERVICAL SPINE WITHOUT CONTRAST TECHNIQUE: Multidetector CT imaging of the head and cervical spine was performed following the standard protocol without intravenous contrast. Multiplanar CT image reconstructions of the cervical spine were also generated. COMPARISON:  Head and cervical spine CT 07/18/2015 FINDINGS: CT HEAD FINDINGS Brain: Generalized cerebral atrophy.No intracranial hemorrhage, mass effect, or midline shift. No hydrocephalus. The basilar cisterns are patent. No evidence of territorial infarct. No extra-axial or intracranial fluid collection. Vascular: Atherosclerosis of skullbase vasculature without hyperdense vessel or abnormal calcification. Skull: No skull fracture.  No focal lesion. Sinuses/Orbits: Mucosal thickening of scattered ethmoid air cells.  Fluid level in right and left sphenoid sinus and right maxillary sinus. Mastoid air cells are well-aerated. Visualized orbits are unremarkable. Other: None. CT CERVICAL SPINE FINDINGS Alignment: Trace anterolisthesis of C3 on C4 is unchanged from prior exam. There is widening and irregularity of the left C3-C4 facet that is new from prior exam but appears chronic. No traumatic subluxation. Skull base and vertebrae: No acute fracture. Vertebral body heights are maintained. Skull base and dens are intact. Soft tissues and spinal canal: No prevertebral fluid or swelling. No visible canal hematoma. Disc levels: Disc space narrowing and endplate spurring at P8-E4 and to a lesser extent C4-C5. Upper chest: A few scattered ground-glass opacities at the left greater than right lung apex, new from chest CT 01/11/2017 Other: None. IMPRESSION: 1.  No acute intracranial abnormality.  No skull fracture. 2. No acute fracture or subluxation of the cervical spine. 3. Widening of the left C3-C4 facet with associated joint space irregularity suggests inflammatory or degenerative arthropathy. This does not appear traumatic. MRI could be considered if there is history of neck pain. 4. Nonspecific left upper lobe ground-glass opacities, atypical infection versus pulmonary edema. Electronically Signed   By: Jeb Levering M.D.   On: 03/13/2017 01:00   US Renal  Result Date: 03/14/2017 CLINICAL DATA:  Acute kidney injury. EXAM: RENAL / URINARY TRACT ULTRASOUND COMPLETE COMPARISON:  Noncontrast CT yesterday. FINDINGS: Right Kidney:  Length: 12.2 cm. No hydronephrosis. There is a 1.8 cm cyst in the interpolar kidney. Small shadowing calculus in the mid upper kidney. Renal echogenicity is normal. Trace perinephric fluid laterally. Left Kidney: Length: 10.9 cm. Echogenicity within normal limits. No mass or hydronephrosis visualized. Small stone on prior CT not appreciated sonographically. Bladder: Foley catheter in place, bladder not  visualized. IMPRESSION: 1. Nonobstructing right nephrolithiasis.  No hydronephrosis. 2. Trace right perinephric fluid, nonspecific. Electronically Signed   By: Jeb Levering M.D.   On: 03/14/2017 23:30   Dg Chest Port 1 View  Result Date: 03/21/2017 CLINICAL DATA:  Shortness of breath . EXAM: PORTABLE CHEST 1 VIEW COMPARISON:  03/12/2017. FINDINGS: Mild cardiomegaly with mild bilateral pulmonary interstitial prominence. Mild CHF cannot be excluded. Pneumonitis cannot be excluded. Mild right mid lung field and bibasilar subsegmental atelectasis No pleural effusion or pneumothorax . No pneumothorax. IMPRESSION: 1. Mild cardiomegaly with mild bilateral interstitial prominence. Mild CHF cannot be excluded. Mild pneumonitis cannot be excluded. 2. Low lung volumes. Mild right mid lung field and bibasilar subsegmental atelectasis. Electronically Signed   By: Marcello Moores  Register   On: 03/21/2017 09:13    Microbiology: No results found for this or any previous visit (from the past 240 hour(s)).   Labs: Basic Metabolic Panel:  Recent Labs Lab 03/19/17 0135 03/20/17 0240 03/21/17 0226 03/22/17 0400 03/22/17 1122 03/23/17 0626 03/23/17 0749  NA 137 137  136 139 135  --  137  --   K 3.7 3.9  3.9 3.7 4.1  --  3.4*  --   CL 93* 95*  95* 97* 96*  --  98*  --   CO2 30 28  29 29 29   --  27  --   GLUCOSE 94 90  93 105* 93  --  92  --   BUN 99* 96*  98* 87* 78*  --  66*  --   CREATININE 7.13* 6.76*  6.87* 6.22* 5.39*  --  4.49*  --   CALCIUM 7.4* 7.8*  7.8* 8.1* 8.1*  --  8.5*  --   MG 1.9 2.0 1.8  --  1.7  --  1.7  PHOS 6.6* 6.9*  6.9* 6.0* 5.7*  --  4.6  --    Liver Function Tests:  Recent Labs Lab 03/17/17 0359 03/18/17 0236 03/19/17 0135 03/20/17 0240 03/21/17 0226 03/22/17 0400 03/23/17 0626  AST 81* 49* 34 26 26  --   --   ALT 99* 72* 55* 47 43  --   --   ALKPHOS 85 84 90 102 106  --   --   BILITOT 0.6 0.5 0.6 0.5 0.6  --   --   PROT 5.6* 5.0* 5.0* 5.4* 5.9*  --   --    ALBUMIN 2.4* 2.1* 2.1* 2.1*  2.2* 2.4* 2.3* 2.4*   No results for input(s): LIPASE, AMYLASE in the last 168 hours. No results for input(s): AMMONIA in the last 168 hours. CBC:  Recent Labs Lab 03/17/17 0359 03/18/17 0236  03/19/17 0135 03/20/17 0240 03/21/17 0226 03/22/17 0400 03/23/17 0749  WBC 7.4 7.6  --  5.6 5.9 8.5 6.7 6.3  NEUTROABS 6.1 6.1  --  4.2 4.6 7.1  --   --   HGB 9.4* 7.7*  < > 9.7* 9.6* 9.8* 9.3* 9.9*  HCT 28.7* 23.1*  < > 28.7* 28.8* 30.1* 29.2* 30.9*  MCV 125.3* 122.2*  --  114.3* 116.6* 117.6* 119.7* 120.2*  PLT 361 395  --  317 339 341 334 410*  < > = values in this interval not displayed. Cardiac Enzymes:  Recent Labs Lab 03/17/17 0359 03/18/17 0950 03/19/17 0135  CKTOTAL 482* 232 158  CKMB 17.1* 16.1* 6.2*  TROPONINI 0.51*  --  0.18*   BNP: BNP (last 3 results) No results for input(s): BNP in the last 8760 hours.  ProBNP (last 3 results) No results for input(s): PROBNP in the last 8760 hours.  CBG: No results for input(s): GLUCAP in the last 168 hours.     Signed:  A Melven Sartorius MD.  [facility] 03/23/2017, 9:42 PM

## 2017-03-23 NOTE — Telephone Encounter (Signed)
Benzonatate was Rachael Johnson/C'ed by Pulmonologist Dr. Kelton Pillar that pt must call to request/thx dmf

## 2017-03-23 NOTE — Progress Notes (Signed)
Physical Therapy Wound Treatment Patient Details  Name: Rachael Johnson MRN: [id number] Date of Birth: 01/15/1951  Today's Date: 03/23/2017 Time: 1030-1058 Time Calculation (min): 28 min  Subjective  Subjective: Oh, honey, it hurts... Patient and Family Stated Goals: I want to get home  Pain Score:  Crying  Up to 8/10  Wound Assessment     Wound / Incision (Open or Dehisced) 03/13/17 Non-pressure wound Shoulder Right;Posterior (Active)  Dressing Type ABD;Gauze (Comment);Moist to dry 03/23/2017 11:07 AM  Dressing Changed Changed 03/23/2017 11:07 AM  Dressing Status Clean;Dry;Intact 03/23/2017 11:07 AM  Dressing Change Frequency Daily 03/23/2017 11:07 AM  Site / Wound Assessment Dressing in place / Unable to assess 03/23/2017 11:07 AM  % Wound base Red or Granulating 20% 03/23/2017 11:07 AM  % Wound base Yellow/Fibrinous Exudate 40% 03/23/2017 11:07 AM  % Wound base Black/Eschar 0% 03/23/2017 11:07 AM  % Wound base Other/Granulation Tissue (Comment) 40% 03/23/2017 11:07 AM  Peri-wound Assessment Erythema (blanchable) 03/23/2017 11:07 AM  Wound Length (cm) 4 cm 03/21/2017 11:04 AM  Wound Width (cm) 9 cm 03/21/2017 11:04 AM  Wound Depth (cm) 0.2 cm 03/21/2017 11:04 AM  Margins Unattached edges (unapproximated) 03/23/2017 11:07 AM  Closure None 03/23/2017 11:07 AM  Drainage Amount Minimal 03/23/2017 11:07 AM  Drainage Description Serosanguineous;Sanguineous 03/23/2017 11:07 AM  Non-staged Wound Description Partial thickness 03/23/2017 11:07 AM  Treatment Cleansed;Debridement (Selective);Hydrotherapy (Pulse lavage);Packing (Saline gauze) 03/23/2017 11:07 AM   Santyl applied to wound bed prior to applying dressing.    Hydrotherapy Pulsed lavage therapy - wound location: R posterior shoulder Pulsed Lavage with Suction (psi): 8 psi Pulsed Lavage with Suction - Normal Saline Used: 1000 mL Pulsed Lavage Tip: Tip with splash shield Selective Debridement Selective Debridement - Location: posterior-  superior shoulder Selective Debridement - Tools Used: Forceps;Scalpel Selective Debridement - Tissue Removed: yellow eschar, necrotic fascia and adipose   Wound Assessment and Plan  Wound Therapy - Assess/Plan/Recommendations Wound Therapy - Clinical Statement: pt can benefit from PLS to soften tenacious yellow eschar for selective debridement.  Expect wound to be relatively superficial. Wound Therapy - Functional Problem List: pt usually functional and independent Hydrotherapy Plan: Debridement;Dressing change;Patient/family education Wound Therapy - Frequency: 6X / week Wound Therapy - Current Recommendations: PT Wound Therapy - Follow Up Recommendations: Home health RN Wound Plan: see above  Wound Therapy Goals- Improve the function of patient's integumentary system by progressing the wound(s) through the phases of wound healing (inflammation - proliferation - remodeling) by: Decrease Necrotic Tissue to: 25% Decrease Necrotic Tissue - Progress: Progressing toward goal Increase Granulation Tissue to: 75% Increase Granulation Tissue - Progress: Progressing toward goal Goals/treatment plan/discharge plan were made with and agreed upon by patient/family: Yes Time For Goal Achievement: 7 days Wound Therapy - Potential for Goals: Good  Goals will be updated until maximal potential achieved or discharge criteria met.  Discharge criteria: when goals achieved, discharge from hospital, MD decision/surgical intervention, no progress towards goals, refusal/missing three consecutive treatments without notification or medical reason.  GP     [name] Joahan Swatzell 03/23/2017, 11:11 AM 03/23/2017  [name], [place] [phone]  (pager)

## 2017-03-23 NOTE — Progress Notes (Signed)
Subjective: Interval History: has no complaint, passing much urine.  Objective: Vital signs in last 24 hours: Temp:  [97.7 F (36.5 C)-98.7 F (37.1 C)] 97.7 F (36.5 C) (08/24 0542) Pulse Rate:  [73-81] 80 (08/24 0954) Resp:  [16-17] 16 (08/24 0954) BP: (121-141)/(58-76) 121/64 (08/24 0542) SpO2:  [91 %-99 %] 94 % (08/24 0954) Weight:  [69.2 kg (152 lb 8.9 oz)] 69.2 kg (152 lb 8.9 oz) (08/24 0542) Weight change: 0.299 kg (10.5 oz)  Intake/Output from previous day: 08/23 0701 - 08/24 0700 In: -  Out: 2500 [Urine:2500] Intake/Output this shift: Total I/O In: -  Out: 200 [Urine:200]  General appearance: alert, cooperative, no distress and pale Resp: clear to auscultation bilaterally Cardio: S1, S2 normal and systolic murmur: holosystolic 2/6, blowing at apex GI: soft, non-tender; bowel sounds normal; no masses,  no organomegaly Extremities: edema 2+ Skin: bruise on skull apex, ulcer R shoulder  Lab Results:  Recent Labs  03/22/17 0400 03/23/17 0749  WBC 6.7 6.3  HGB 9.3* 9.9*  HCT 29.2* 30.9*  PLT 334 410*   BMET:  Recent Labs  03/22/17 0400 03/23/17 0626  NA 135 137  K 4.1 3.4*  CL 96* 98*  CO2 29 27  GLUCOSE 93 92  BUN 78* 66*  CREATININE 5.39* 4.49*  CALCIUM 8.1* 8.5*   No results for input(s): PTH in the last 72 hours. Iron Studies: No results for input(s): IRON, TIBC, TRANSFERRIN, FERRITIN in the last 72 hours.  Studies/Results: No results found.  I have reviewed the patient's current medications.  Assessment/Plan: 1 AKI resolving , diuresing.  2 Anemai 3 Skin ulcers 4 hypothyroid P Can d/c and f/u outpatient, will set up for labs and visit    LOS: 10 days   [name] L 03/23/2017,1:19 PM

## 2017-03-24 DIAGNOSIS — L89890 Pressure ulcer of other site, unstageable: Secondary | ICD-10-CM | POA: Diagnosis not present

## 2017-03-24 DIAGNOSIS — D473 Essential (hemorrhagic) thrombocythemia: Secondary | ICD-10-CM | POA: Diagnosis not present

## 2017-03-24 DIAGNOSIS — Z9181 History of falling: Secondary | ICD-10-CM | POA: Diagnosis not present

## 2017-03-24 DIAGNOSIS — M150 Primary generalized (osteo)arthritis: Secondary | ICD-10-CM | POA: Diagnosis not present

## 2017-03-24 DIAGNOSIS — L89112 Pressure ulcer of right upper back, stage 2: Secondary | ICD-10-CM | POA: Diagnosis not present

## 2017-03-24 DIAGNOSIS — F419 Anxiety disorder, unspecified: Secondary | ICD-10-CM | POA: Diagnosis not present

## 2017-03-24 DIAGNOSIS — W208XXD Other cause of strike by thrown, projected or falling object, subsequent encounter: Secondary | ICD-10-CM | POA: Diagnosis not present

## 2017-03-24 DIAGNOSIS — F329 Major depressive disorder, single episode, unspecified: Secondary | ICD-10-CM | POA: Diagnosis not present

## 2017-03-24 DIAGNOSIS — M6282 Rhabdomyolysis: Secondary | ICD-10-CM | POA: Diagnosis not present

## 2017-03-24 DIAGNOSIS — Z87891 Personal history of nicotine dependence: Secondary | ICD-10-CM | POA: Diagnosis not present

## 2017-03-24 DIAGNOSIS — S0100XD Unspecified open wound of scalp, subsequent encounter: Secondary | ICD-10-CM | POA: Diagnosis not present

## 2017-03-24 DIAGNOSIS — I10 Essential (primary) hypertension: Secondary | ICD-10-CM | POA: Diagnosis not present

## 2017-03-25 ENCOUNTER — Other Ambulatory Visit: Payer: Self-pay | Admitting: Family Medicine

## 2017-03-25 DIAGNOSIS — M150 Primary generalized (osteo)arthritis: Secondary | ICD-10-CM | POA: Diagnosis not present

## 2017-03-25 DIAGNOSIS — L89890 Pressure ulcer of other site, unstageable: Secondary | ICD-10-CM | POA: Diagnosis not present

## 2017-03-25 DIAGNOSIS — L89112 Pressure ulcer of right upper back, stage 2: Secondary | ICD-10-CM | POA: Diagnosis not present

## 2017-03-25 DIAGNOSIS — I10 Essential (primary) hypertension: Secondary | ICD-10-CM | POA: Diagnosis not present

## 2017-03-25 DIAGNOSIS — S0100XD Unspecified open wound of scalp, subsequent encounter: Secondary | ICD-10-CM | POA: Diagnosis not present

## 2017-03-25 DIAGNOSIS — M6282 Rhabdomyolysis: Secondary | ICD-10-CM | POA: Diagnosis not present

## 2017-03-25 LAB — METHYLMALONIC ACID, SERUM: Methylmalonic Acid, Quantitative: 819 nmol/L — ABNORMAL HIGH (ref 0–378)

## 2017-03-25 LAB — URINE CULTURE: Culture: >=100000 — AB

## 2017-03-27 ENCOUNTER — Ambulatory Visit: Payer: Medicare Other | Admitting: Family Medicine

## 2017-03-27 DIAGNOSIS — L89112 Pressure ulcer of right upper back, stage 2: Secondary | ICD-10-CM | POA: Diagnosis not present

## 2017-03-27 DIAGNOSIS — I10 Essential (primary) hypertension: Secondary | ICD-10-CM | POA: Diagnosis not present

## 2017-03-27 DIAGNOSIS — L89890 Pressure ulcer of other site, unstageable: Secondary | ICD-10-CM | POA: Diagnosis not present

## 2017-03-27 DIAGNOSIS — M6282 Rhabdomyolysis: Secondary | ICD-10-CM | POA: Diagnosis not present

## 2017-03-27 DIAGNOSIS — S0100XD Unspecified open wound of scalp, subsequent encounter: Secondary | ICD-10-CM | POA: Diagnosis not present

## 2017-03-27 DIAGNOSIS — M150 Primary generalized (osteo)arthritis: Secondary | ICD-10-CM | POA: Diagnosis not present

## 2017-03-28 ENCOUNTER — Other Ambulatory Visit: Payer: Self-pay | Admitting: *Deleted

## 2017-03-28 DIAGNOSIS — M150 Primary generalized (osteo)arthritis: Secondary | ICD-10-CM | POA: Diagnosis not present

## 2017-03-28 DIAGNOSIS — L89890 Pressure ulcer of other site, unstageable: Secondary | ICD-10-CM | POA: Diagnosis not present

## 2017-03-28 DIAGNOSIS — N179 Acute kidney failure, unspecified: Secondary | ICD-10-CM

## 2017-03-28 DIAGNOSIS — L89112 Pressure ulcer of right upper back, stage 2: Secondary | ICD-10-CM | POA: Diagnosis not present

## 2017-03-28 DIAGNOSIS — I10 Essential (primary) hypertension: Secondary | ICD-10-CM | POA: Diagnosis not present

## 2017-03-28 DIAGNOSIS — M6282 Rhabdomyolysis: Secondary | ICD-10-CM | POA: Diagnosis not present

## 2017-03-28 DIAGNOSIS — S0100XD Unspecified open wound of scalp, subsequent encounter: Secondary | ICD-10-CM | POA: Diagnosis not present

## 2017-03-29 ENCOUNTER — Telehealth: Payer: Self-pay | Admitting: *Deleted

## 2017-03-29 DIAGNOSIS — I10 Essential (primary) hypertension: Secondary | ICD-10-CM | POA: Diagnosis not present

## 2017-03-29 DIAGNOSIS — M150 Primary generalized (osteo)arthritis: Secondary | ICD-10-CM | POA: Diagnosis not present

## 2017-03-29 DIAGNOSIS — S0100XD Unspecified open wound of scalp, subsequent encounter: Secondary | ICD-10-CM | POA: Diagnosis not present

## 2017-03-29 DIAGNOSIS — M6282 Rhabdomyolysis: Secondary | ICD-10-CM | POA: Diagnosis not present

## 2017-03-29 DIAGNOSIS — L89890 Pressure ulcer of other site, unstageable: Secondary | ICD-10-CM | POA: Diagnosis not present

## 2017-03-29 DIAGNOSIS — L89112 Pressure ulcer of right upper back, stage 2: Secondary | ICD-10-CM | POA: Diagnosis not present

## 2017-03-29 NOTE — Telephone Encounter (Signed)
Received Physician Orders from [facility]; forwarded to provider/SLS 08/30

## 2017-03-30 DIAGNOSIS — M6282 Rhabdomyolysis: Secondary | ICD-10-CM | POA: Diagnosis not present

## 2017-03-30 DIAGNOSIS — L89112 Pressure ulcer of right upper back, stage 2: Secondary | ICD-10-CM | POA: Diagnosis not present

## 2017-03-30 DIAGNOSIS — S0100XD Unspecified open wound of scalp, subsequent encounter: Secondary | ICD-10-CM | POA: Diagnosis not present

## 2017-03-30 DIAGNOSIS — L89890 Pressure ulcer of other site, unstageable: Secondary | ICD-10-CM | POA: Diagnosis not present

## 2017-03-30 DIAGNOSIS — M150 Primary generalized (osteo)arthritis: Secondary | ICD-10-CM | POA: Diagnosis not present

## 2017-03-30 DIAGNOSIS — I10 Essential (primary) hypertension: Secondary | ICD-10-CM | POA: Diagnosis not present

## 2017-04-02 DIAGNOSIS — L89890 Pressure ulcer of other site, unstageable: Secondary | ICD-10-CM | POA: Diagnosis not present

## 2017-04-02 DIAGNOSIS — M6282 Rhabdomyolysis: Secondary | ICD-10-CM | POA: Diagnosis not present

## 2017-04-02 DIAGNOSIS — M150 Primary generalized (osteo)arthritis: Secondary | ICD-10-CM | POA: Diagnosis not present

## 2017-04-02 DIAGNOSIS — S0100XD Unspecified open wound of scalp, subsequent encounter: Secondary | ICD-10-CM | POA: Diagnosis not present

## 2017-04-02 DIAGNOSIS — L89112 Pressure ulcer of right upper back, stage 2: Secondary | ICD-10-CM | POA: Diagnosis not present

## 2017-04-02 DIAGNOSIS — I10 Essential (primary) hypertension: Secondary | ICD-10-CM | POA: Diagnosis not present

## 2017-04-03 ENCOUNTER — Other Ambulatory Visit (HOSPITAL_BASED_OUTPATIENT_CLINIC_OR_DEPARTMENT_OTHER): Payer: Medicare Other

## 2017-04-03 ENCOUNTER — Ambulatory Visit (HOSPITAL_BASED_OUTPATIENT_CLINIC_OR_DEPARTMENT_OTHER): Payer: Medicare Other | Admitting: Hematology & Oncology

## 2017-04-03 ENCOUNTER — Encounter: Payer: Self-pay | Admitting: Hematology & Oncology

## 2017-04-03 ENCOUNTER — Ambulatory Visit (HOSPITAL_BASED_OUTPATIENT_CLINIC_OR_DEPARTMENT_OTHER): Payer: Medicare Other

## 2017-04-03 ENCOUNTER — Ambulatory Visit (HOSPITAL_COMMUNITY)
Admission: RE | Admit: 2017-04-03 | Discharge: 2017-04-03 | Disposition: A | Discharge status: Home / Self Care | Payer: Medicare Other | Source: Ambulatory Visit | Attending: Hematology & Oncology | Admitting: Hematology & Oncology

## 2017-04-03 VITALS — BP 114/57 | HR 76

## 2017-04-03 VITALS — BP 128/61 | HR 82 | Temp 98.2°F | Resp 19 | Wt 140.4 lb

## 2017-04-03 DIAGNOSIS — N181 Chronic kidney disease, stage 1: Secondary | ICD-10-CM | POA: Insufficient documentation

## 2017-04-03 DIAGNOSIS — D473 Essential (hemorrhagic) thrombocythemia: Secondary | ICD-10-CM

## 2017-04-03 DIAGNOSIS — N170 Acute kidney failure with tubular necrosis: Secondary | ICD-10-CM | POA: Insufficient documentation

## 2017-04-03 DIAGNOSIS — N179 Acute kidney failure, unspecified: Secondary | ICD-10-CM

## 2017-04-03 DIAGNOSIS — E039 Hypothyroidism, unspecified: Secondary | ICD-10-CM | POA: Diagnosis not present

## 2017-04-03 DIAGNOSIS — I10 Essential (primary) hypertension: Secondary | ICD-10-CM

## 2017-04-03 DIAGNOSIS — D500 Iron deficiency anemia secondary to blood loss (chronic): Secondary | ICD-10-CM | POA: Insufficient documentation

## 2017-04-03 DIAGNOSIS — D509 Iron deficiency anemia, unspecified: Secondary | ICD-10-CM

## 2017-04-03 HISTORY — DX: Iron deficiency anemia secondary to blood loss (chronic): D50.0

## 2017-04-03 LAB — CBC WITH DIFFERENTIAL (CANCER CENTER ONLY)
BASO#: 0 10*3/uL (ref 0.0–0.2)
BASO%: 0.5 % (ref 0.0–2.0)
EOS%: 8.3 % — ABNORMAL HIGH (ref 0.0–7.0)
Eosinophils Absolute: 0.4 10*3/uL (ref 0.0–0.5)
HCT: 27.7 % — ABNORMAL LOW (ref 34.8–46.6)
HGB: 8.7 g/dL — ABNORMAL LOW (ref 11.6–15.9)
LYMPH#: 0.7 10*3/uL — ABNORMAL LOW (ref 0.9–3.3)
LYMPH%: 16.8 % (ref 14.0–48.0)
MCH: 40.3 pg — ABNORMAL HIGH (ref 26.0–34.0)
MCHC: 31.4 g/dL — ABNORMAL LOW (ref 32.0–36.0)
MCV: 128 fL — ABNORMAL HIGH (ref 81–101)
MONO#: 0.4 10*3/uL (ref 0.1–0.9)
MONO%: 8.3 % (ref 0.0–13.0)
NEUT#: 2.8 10*3/uL (ref 1.5–6.5)
NEUT%: 66.1 % (ref 39.6–80.0)
Platelets: 717 10*3/uL — ABNORMAL HIGH (ref 145–400)
RBC: 2.16 10*6/uL — ABNORMAL LOW (ref 3.70–5.32)
RDW: 16.2 % — ABNORMAL HIGH (ref 11.1–15.7)
WBC: 4.2 10*3/uL (ref 3.9–10.0)

## 2017-04-03 LAB — CMP (CANCER CENTER ONLY)
ALT(SGPT): 16 U/L (ref 10–47)
AST: 23 U/L (ref 11–38)
Albumin: 3.1 g/dL — ABNORMAL LOW (ref 3.3–5.5)
Alkaline Phosphatase: 110 U/L — ABNORMAL HIGH (ref 26–84)
BUN, Bld: 13 mg/dL (ref 7–22)
CO2: 30 mEq/L (ref 18–33)
Calcium: 8.2 mg/dL (ref 8.0–10.3)
Chloride: 104 mEq/L (ref 98–108)
Creat: 1.4 mg/dl — ABNORMAL HIGH (ref 0.6–1.2)
Glucose, Bld: 130 mg/dL — ABNORMAL HIGH (ref 73–118)
Potassium: 3.5 mEq/L (ref 3.3–4.7)
Sodium: 139 mEq/L (ref 128–145)
Total Bilirubin: 0.5 mg/dl (ref 0.20–1.60)
Total Protein: 7.3 g/dL (ref 6.4–8.1)

## 2017-04-03 LAB — IRON AND TIBC
%SAT: 21 % (ref 21–57)
Iron: 34 ug/dL — ABNORMAL LOW (ref 41–142)
TIBC: 158 ug/dL — ABNORMAL LOW (ref 236–444)
UIBC: 124 ug/dL (ref 120–384)

## 2017-04-03 LAB — ABO/RH: ABO/RH(D): O POS

## 2017-04-03 LAB — FERRITIN: Ferritin: 801 ng/ml — ABNORMAL HIGH (ref 9–269)

## 2017-04-03 MED ORDER — SODIUM CHLORIDE 0.9 % IV SOLN
Freq: Once | INTRAVENOUS | Status: AC
  Administered 2017-04-03: 12:00:00 via INTRAVENOUS

## 2017-04-03 MED ORDER — SODIUM CHLORIDE 0.9 % IV SOLN
510.0000 mg | Freq: Once | INTRAVENOUS | Status: AC
  Administered 2017-04-03: 510 mg via INTRAVENOUS
  Filled 2017-04-03: qty 17

## 2017-04-03 NOTE — Patient Instructions (Signed)

## 2017-04-03 NOTE — Progress Notes (Signed)
Hematology and Oncology Follow Up Visit  Rachael Johnson [id number] Mar 06, 1951 66 y.o. 04/03/2017   Principle Diagnosis:  Essential thrombocythemia - JAK2 negative. 2. Intermittent iron-deficiency anemia.  Current Therapy:   Hydrea 1000/1000/500 mg by mouth daily Aspirin 81 mg by mouth daily IV iron as indicated - last dose given on 02/22/2016      Interim History:  [name] is back for followup. Shockingly enough, she was hospitalized 10 days ago. She was in the hospital for 9 days. She fell. She was on the floor for a day or so. She is brought to the hospital. She was in renal failure secondary to rhabdomyolysis. Her CK level was 32,000. Thank you, she did not require dialysis. She did get IV fluids. Everything eventually began to normalize.  She had 1 unit of blood.  Her iron studies were done. They showed iron deficiency. She got any iron in the hospital. We'll go ahead and give her a dose today.  She feels horrible. She feels so tired. Her hemoglobin today is 8.7. Her platelet count is 717,000. I think this is all reflective of her being in the hospital and being iron deficient.  She is going to the beach in 1 week. I want her feeling well. This is a B [place] for her. I really want her to feel more energetic. I think that we will have to transfuse her. I talked to her about a transfusion. She understands about transfusions and will agree. We will give her 2 units of blood in 2 days.  I'm unsure she got Hydrea while she was in the hospital. She is back on Hydrea right now.   She's had no obvious bleeding. There's been no obvious cough. She sees the lung doctor and I think heart doctor in the next day or so.  She is on a ricotta medications. It would be nice if some of his medications could be discontinued.  Currently, her performance status is ECOG 2.   Medications:  Current Outpatient Prescriptions:  .  albuterol (VENTOLIN HFA) 108 (90 Base) MCG/ACT inhaler, INHALE 2 PUFFS  INTO THE LUNGS EVERY 6 (SIX) HOURS AS NEEDED FOR WHEEZING OR SHORTNESS OF BREATH.  Can use Ventolin, ProAir or Proventil whichever cheaper, Disp: 54 Inhaler, Rfl: 3 .  ALPRAZolam (XANAX) 1 MG tablet, TAKE 1 TABLET BY MOUTH THREE TIMES A DAY AS NEEDED FOR ANXIETY, Disp: 180 tablet, Rfl: 1 .  amitriptyline (ELAVIL) 10 MG tablet, TAKE 2 TABLETS (20 MG TOTAL) BY MOUTH AT BEDTIME., Disp: 60 tablet, Rfl: 3 .  aspirin EC 81 MG tablet, Take 81 mg by mouth every morning., Disp: , Rfl:  .  benzonatate (TESSALON) 200 MG capsule, Take 200 mg by mouth 3 (three) times daily as needed for cough. , Disp: , Rfl: 1 .  budesonide-formoterol (SYMBICORT) 80-4.5 MCG/ACT inhaler, Inhale 2 puffs into the lungs 2 (two) times daily., Disp: 1 Inhaler, Rfl: 5 .  busPIRone (BUSPAR) 10 MG tablet, Take 1 tablet (10 mg total) by mouth 3 (three) times daily., Disp: 90 tablet, Rfl: 0 .  cetirizine (ZYRTEC) 10 MG tablet, Take 1 tablet (10 mg total) by mouth daily., Disp: 90 tablet, Rfl: 1 .  chlorpheniramine (CHLOR-TRIMETON) 4 MG tablet, Take 8 mg by mouth at bedtime., Disp: , Rfl:  .  citalopram (CELEXA) 40 MG tablet, Take 0.5 tablets (20 mg total) by mouth every morning., Disp: 90 tablet, Rfl: 0 .  collagenase (SANTYL) ointment, Apply topically daily., Disp: 15 g, Rfl:  0 .  Cranberry 300 MG tablet, Take 300 mg by mouth 2 (two) times daily., Disp: , Rfl:  .  cyclobenzaprine (FLEXERIL) 10 MG tablet, Take 1 tablet (10 mg total) by mouth 2 (two) times daily as needed for muscle spasms., Disp: 60 tablet, Rfl: 3 .  estradiol (ESTRACE) 0.5 MG tablet, Take 1 tablet (0.5 mg total) by mouth 2 (two) times daily., Disp: 180 tablet, Rfl: 1 .  fluticasone (FLONASE) 50 MCG/ACT nasal spray, Place 2 sprays into both nostrils daily., Disp: 48 g, Rfl: 3 .  folic acid (FOLVITE) 161 MCG tablet, Take 800 mcg by mouth daily., Disp: , Rfl:  .  HYDROcodone-acetaminophen (NORCO) 5-325 MG tablet, Take 1 tablet by mouth every 6 (six) hours as needed for severe  pain. Take one tablet by mouth every 6 hours as needed for severe pain, Disp: 120 tablet, Rfl: 0 .  hydroxyurea (HYDREA) 500 MG capsule, TAKE 2 CAPSULES BY MOUTH EVERY DAY, Disp: 60 capsule, Rfl: 0 .  lactulose (CHRONULAC) 10 GM/15ML solution, TAKE 2 TABLESPOONFULS (30 ML) BY MOUTH 3 TIMES A DAY FOR CONSTIPATION, Disp: 1000 mL, Rfl: 8 .  levothyroxine (SYNTHROID, LEVOTHROID) 88 MCG tablet, TAKE 1 TABLET DAILY, Disp: 30 tablet, Rfl: 2 .  meclizine (ANTIVERT) 12.5 MG tablet, TAKE 1 TABLET THREE TIMES A DAY AS NEEDED FOR NAUSEA OR DIZZINESS, Disp: 30 tablet, Rfl: 1 .  montelukast (SINGULAIR) 10 MG tablet, Take 1 tablet (10 mg total) by mouth at bedtime., Disp: 90 tablet, Rfl: 3 .  Multiple Vitamin (MULTIVITAMIN PO), Take 1 tablet by mouth every morning. , Disp: , Rfl:  .  naproxen (NAPROSYN) 500 MG tablet, TAKE 1 TABLET (500 MG TOTAL) BY MOUTH 2 (TWO) TIMES DAILY WITH A MEAL., Disp: 180 tablet, Rfl: 1 .  omeprazole (PRILOSEC) 40 MG capsule, Take 40 mg by mouth 2 (two) times daily., Disp: , Rfl:  .  pantoprazole (PROTONIX) 40 MG tablet, Take 1 tablet (40 mg total) by mouth 2 (two) times daily., Disp: 180 tablet, Rfl: 1 .  pramipexole (MIRAPEX) 0.25 MG tablet, TAKE ONE OR TWO TABLETS BY MOUTH AT BEDTIME AS NEEDED, Disp: 180 tablet, Rfl: 1 .  promethazine (PHENERGAN) 25 MG tablet, Take 1 tablet (25 mg total) by mouth every 6 (six) hours as needed for nausea or vomiting., Disp: 60 tablet, Rfl: 4 .  ranitidine (ZANTAC) 300 MG tablet, Take 1 tablet (300 mg total) by mouth at bedtime., Disp: 90 tablet, Rfl: 1 .  RESTASIS 0.05 % ophthalmic emulsion, Place 1 drop into both eyes 2 (two) times daily. , Disp: , Rfl: 3 .  venlafaxine XR (EFFEXOR-XR) 75 MG 24 hr capsule, TAKE ONE CAPSULE BY MOUTH DAILY WITH BREAKFAST, Disp: 90 capsule, Rfl: 1 .  Vitamin D, Ergocalciferol, (DRISDOL) 50000 units CAPS capsule, Take 1 capsule (50,000 Units total) by mouth every 7 (seven) days., Disp: 12 capsule, Rfl: 1  Allergies: No  Known Allergies  Past Medical History, Surgical history, Social history, and Family History were reviewed and updated.  Review of Systems: As in the interim history  Physical Exam:  weight is 140 lb 6.4 oz (63.7 kg). Her oral temperature is 98.2 F (36.8 C). Her blood pressure is 128/61 and her pulse is 82. Her respiration is 19 and oxygen saturation is 100%.   Thin white female in no obvious distress. Head and neck exam shows pale conjunctiva. She has no oral lesions. There is no adenopathy in the neck. Lungs are clear. Cardiac exam regular rate and  rhythm with no murmurs, rubs or bruits. Abdomen is soft. She has good bowel sounds. There is no fluid wave. There is no palpable liver or spleen tip. Back exam shows no tenderness over the spine, ribs or hips. Extremities shows no clubbing, cyanosis or edema. She may actually have a little bit of nonpitting edema in the lower legs. Skin exam shows a dressing over the right shoulder. She says that her skin broke down and wasn't necrotic. I'm not sure exactly what this means. Neurological exam shows no focal neurological deficits.  Lab Results  Component Value Date   WBC 4.2 04/03/2017   HGB 8.7 (L) 04/03/2017   HCT 27.7 (L) 04/03/2017   MCV 128 (H) 04/03/2017   PLT 717 Platelet count consistent in citrate (H) 04/03/2017     Chemistry      Component Value Date/Time   NA 139 04/03/2017 1008   NA 140 11/29/2016 0936   K 3.5 04/03/2017 1008   K 4.2 11/29/2016 0936   CL 104 04/03/2017 1008   CO2 30 04/03/2017 1008   CO2 27 11/29/2016 0936   BUN 13 04/03/2017 1008   BUN 18.3 11/29/2016 0936   CREATININE 1.4 (H) 04/03/2017 1008   CREATININE 0.8 11/29/2016 0936      Component Value Date/Time   CALCIUM 8.2 04/03/2017 1008   CALCIUM 8.9 11/29/2016 0936   ALKPHOS 110 (H) 04/03/2017 1008   ALKPHOS 77 11/29/2016 0936   AST 23 04/03/2017 1008   AST 19 11/29/2016 0936   ALT 16 04/03/2017 1008   ALT 10 11/29/2016 0936   BILITOT 0.50  04/03/2017 1008   BILITOT <0.22 11/29/2016 0936         Impression and Plan: Rachael Johnson is 67 year-old white female with essential thrombocythemia.  This is incurably complicated today. I had no idea that she was in the hospital. She kept telling the doctors to let me know. It would've been a lot easier today if I would a known if she was in the hospital.  She will need to be transfused. She will get 2 units of blood. I talked her about the transfusion. She understands about the transfusion. She understands the risks of a transfusion. She has been transfused before. She agrees.  We will give her a dose of IV iron. I don't think she iron in the hospital.  The thrombocytosis is reactive for the most part. I think this probably also reflects low iron.  We will have to watch her closely.  I spent about 40 minutes with her today. Again I was totally unaware of her having rhabdomyolysis and the problems that she had. I was just surprised by the change in her blood counts. We had to work quick and it did take quite a while to get the necessary interventions done today.  I will like to see her back after she gets back before she goes on her vacation. That means I will see her back on September 14.   [name], MD 9/4/201811:14 AM

## 2017-04-04 ENCOUNTER — Other Ambulatory Visit: Payer: Medicare Other

## 2017-04-04 ENCOUNTER — Encounter: Payer: Self-pay | Admitting: Physician Assistant

## 2017-04-04 ENCOUNTER — Ambulatory Visit (INDEPENDENT_AMBULATORY_CARE_PROVIDER_SITE_OTHER): Payer: Medicare Other | Admitting: Adult Health

## 2017-04-04 ENCOUNTER — Ambulatory Visit (INDEPENDENT_AMBULATORY_CARE_PROVIDER_SITE_OTHER): Payer: Medicare Other | Admitting: Physician Assistant

## 2017-04-04 ENCOUNTER — Encounter: Payer: Self-pay | Admitting: Adult Health

## 2017-04-04 ENCOUNTER — Encounter: Payer: Medicare Other | Admitting: Adult Health

## 2017-04-04 VITALS — BP 118/68 | HR 78 | Ht 63.0 in | Wt 143.4 lb

## 2017-04-04 VITALS — BP 112/60 | HR 68 | Ht 63.0 in | Wt 140.8 lb

## 2017-04-04 DIAGNOSIS — R748 Abnormal levels of other serum enzymes: Secondary | ICD-10-CM

## 2017-04-04 DIAGNOSIS — J479 Bronchiectasis, uncomplicated: Secondary | ICD-10-CM

## 2017-04-04 DIAGNOSIS — R7989 Other specified abnormal findings of blood chemistry: Principal | ICD-10-CM

## 2017-04-04 DIAGNOSIS — J45991 Cough variant asthma: Secondary | ICD-10-CM | POA: Diagnosis not present

## 2017-04-04 DIAGNOSIS — R9431 Abnormal electrocardiogram [ECG] [EKG]: Secondary | ICD-10-CM | POA: Diagnosis not present

## 2017-04-04 DIAGNOSIS — I10 Essential (primary) hypertension: Secondary | ICD-10-CM

## 2017-04-04 DIAGNOSIS — R778 Other specified abnormalities of plasma proteins: Secondary | ICD-10-CM

## 2017-04-04 LAB — RETICULOCYTES: Reticulocyte Count: 0.8 % (ref 0.6–2.6)

## 2017-04-04 MED ORDER — BUDESONIDE-FORMOTEROL FUMARATE 80-4.5 MCG/ACT IN AERO: 2.0000 | INHALATION_SPRAY | Freq: Two times a day (BID) | RESPIRATORY_TRACT | 0 refills | Status: DC

## 2017-04-04 NOTE — Assessment & Plan Note (Signed)
Stable currently  Patient's medications were reviewed today and patient education was given. Computerized medication calendar was adjusted/completed   Plan  Patient Instructions  Labs today .  Follow med calendar closely and bring to each visit .  Follow up with Dr. Melvyn Novas  In 2 months and .As needed

## 2017-04-04 NOTE — Progress Notes (Signed)
@Patient  ID: Rachael Johnson, female    DOB: [birth year], 66 y.o.   MRN: [id number]  Chief Complaint  Patient presents with  . Follow-up    Asthma     Referring provider: Mosie Lukes, MD  HPI: 66 yo female former smoker (1992) seen for pulmonary consult 11/27/16 for cough and Bronchiectasis on CT chest (09/2016)  Followed by Dr. Marin Olp for essential thrombocythemia on hydrea.    TEST  CT chest 09/2016 >GGO, bronchectasis , irregular nodules 1.6cm in RUL ,  CT sinus 11/2016 >neg  Spirometry 10/2016 >neg   04/04/2017 Follow up ; Cough /Bronchiectasis /Med Review  Patient presents for a one-month follow-up. Patient had a flare of her cough. Last visit. She was given a prednisone taper. Cough is some better . Says tessalon does not work that good. No fever or discolored mucus.   Patient has known abnormal CT chest with groundglass opacities w/ lung nodules  A follow-up CT chest done in June 2018 showed less distinct nodules in the right upper lobe. And a slight progression of a probable atypical infectious process such as MAI. No flare of cough or congestion   Patient was admitted 2 weeks ago after a fall at home (lied on floor for ~1 day ) developed rhabdomyolysis and acute renal failure. She required IV fluids . Did not require dialysis .  She had a severe scalp and scapula wound .   We reviewed all her medications organize them into a medication calendar. Patient had multiple medication changes lately. Her list was updated.     No Known Allergies  Immunization History  Administered Date(s) Administered  . Influenza Whole 05/16/2013  . Influenza,inj,Quad PF,6+ Mos 04/02/2015, 04/18/2016  . Influenza-Unspecified 05/31/2014  . Pneumococcal Conjugate-13 04/04/2013  . Pneumococcal Polysaccharide-23 04/18/2016  . Pneumococcal-Unspecified 04/10/2011  . Td 08/01/2007  . Tdap 07/06/2014  . Zoster 04/28/2013    Past Medical History:  Diagnosis Date  . Anemia, iron deficiency  07/08/2012  . Arthritis   . Cataract 12/21/2014  . Depression with anxiety 03/24/2011  . Diverticulitis   . Esophageal reflux 08/17/2013  . Essential thrombocythemia ([place]) 01/24/2011  . Frequent episodic tension-type headache   . Gastroenteritis 12/21/2014  . 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 GERD (gastroesophageal reflux disease)   . H. pylori infection 12/19/2012  . Hyperglycemia 12/17/2015  . Hypertension   . Iron deficiency anemia due to chronic blood loss 04/03/2017  . Other and unspecified hyperlipidemia 02/25/2013  . Restless leg syndrome 09/30/2014  . Rhabdomyolysis 02/2017  . Scabies 03/28/2015  . Seizure ([place])    childhood  . Thyroid disease     Tobacco History: History  Smoking Status  . Former Smoker  . Packs/day: 3.50  . Years: 20.00  . Types: Cigarettes  . Start date: 11/15/1970  . Quit date: 07/31/1990  Smokeless Tobacco  . Never Used    Comment: quit 23 years ago   Counseling given: Not Answered   Outpatient Encounter Prescriptions as of 04/04/2017  Medication Sig  . albuterol (VENTOLIN HFA) 108 (90 Base) MCG/ACT inhaler INHALE 2 PUFFS INTO THE LUNGS EVERY 6 (SIX) HOURS AS NEEDED FOR WHEEZING OR SHORTNESS OF BREATH.  Can use Ventolin, ProAir or Proventil whichever cheaper  . ALPRAZolam (XANAX) 1 MG tablet TAKE 1 TABLET BY MOUTH THREE TIMES A DAY AS NEEDED FOR ANXIETY  . amitriptyline (ELAVIL) 10 MG tablet TAKE 2 TABLETS (20 MG TOTAL) BY MOUTH AT BEDTIME.  Marland Kitchen aspirin EC 81 MG tablet Take  81 mg by mouth every morning.  . benzonatate (TESSALON) 200 MG capsule Take 200 mg by mouth 3 (three) times daily as needed for cough.   . budesonide-formoterol (SYMBICORT) 80-4.5 MCG/ACT inhaler Inhale 2 puffs into the lungs 2 (two) times daily.  . busPIRone (BUSPAR) 10 MG tablet Take 1 tablet (10 mg total) by mouth 3 (three) times daily.  . cetirizine (ZYRTEC) 10 MG tablet Take 1 tablet (10 mg total) by mouth daily.  . chlorpheniramine (CHLOR-TRIMETON) 4 MG tablet Take 8 mg by mouth at  bedtime.  . citalopram (CELEXA) 40 MG tablet Take 0.5 tablets (20 mg total) by mouth every morning.  . collagenase (SANTYL) ointment Apply topically daily.  . Cranberry 300 MG tablet Take 300 mg by mouth 2 (two) times daily.  . cyclobenzaprine (FLEXERIL) 10 MG tablet Take 1 tablet (10 mg total) by mouth 2 (two) times daily as needed for muscle spasms.  Marland Kitchen estradiol (ESTRACE) 0.5 MG tablet Take 1 tablet (0.5 mg total) by mouth 2 (two) times daily.  . fluticasone (FLONASE) 50 MCG/ACT nasal spray Place 2 sprays into both nostrils daily.  . folic acid (FOLVITE) 485 MCG tablet Take 800 mcg by mouth daily.  Marland Kitchen HYDROcodone-acetaminophen (NORCO) 5-325 MG tablet Take 1 tablet by mouth every 6 (six) hours as needed for severe pain. Take one tablet by mouth every 6 hours as needed for severe pain  . hydroxyurea (HYDREA) 500 MG capsule TAKE 2 CAPSULES BY MOUTH EVERY DAY  . lactulose (CHRONULAC) 10 GM/15ML solution TAKE 2 TABLESPOONFULS (30 ML) BY MOUTH 3 TIMES A DAY FOR CONSTIPATION  . levothyroxine (SYNTHROID, LEVOTHROID) 88 MCG tablet TAKE 1 TABLET DAILY  . meclizine (ANTIVERT) 12.5 MG tablet TAKE 1 TABLET THREE TIMES A DAY AS NEEDED FOR NAUSEA OR DIZZINESS  . montelukast (SINGULAIR) 10 MG tablet Take 1 tablet (10 mg total) by mouth at bedtime.  . Multiple Vitamin (MULTIVITAMIN PO) Take 1 tablet by mouth every morning.   . naproxen (NAPROSYN) 500 MG tablet TAKE 1 TABLET (500 MG TOTAL) BY MOUTH 2 (TWO) TIMES DAILY WITH A MEAL.  Marland Kitchen omeprazole (PRILOSEC) 40 MG capsule Take 40 mg by mouth 2 (two) times daily.  . pantoprazole (PROTONIX) 40 MG tablet Take 1 tablet (40 mg total) by mouth 2 (two) times daily.  . pramipexole (MIRAPEX) 0.25 MG tablet TAKE ONE OR TWO TABLETS BY MOUTH AT BEDTIME AS NEEDED  . promethazine (PHENERGAN) 25 MG tablet Take 1 tablet (25 mg total) by mouth every 6 (six) hours as needed for nausea or vomiting.  . ranitidine (ZANTAC) 300 MG tablet Take 1 tablet (300 mg total) by mouth at bedtime.    . RESTASIS 0.05 % ophthalmic emulsion Place 1 drop into both eyes 2 (two) times daily.   Marland Kitchen venlafaxine XR (EFFEXOR-XR) 75 MG 24 hr capsule TAKE ONE CAPSULE BY MOUTH DAILY WITH BREAKFAST  . Vitamin D, Ergocalciferol, (DRISDOL) 50000 units CAPS capsule Take 1 capsule (50,000 Units total) by mouth every 7 (seven) days.  . budesonide-formoterol (SYMBICORT) 80-4.5 MCG/ACT inhaler Inhale 2 puffs into the lungs 2 (two) times daily.   No facility-administered encounter medications on file as of 04/04/2017.      Review of Systems  Constitutional:   No  weight loss, night sweats,  Fevers, chills, + fatigue, or  lassitude.  HEENT:   No headaches,  Difficulty swallowing,  Tooth/dental problems, or  Sore throat,                No  sneezing, itching, ear ache, nasal congestion, post nasal drip,   CV:  No chest pain,  Orthopnea, PND, swelling in lower extremities, anasarca, dizziness, palpitations, syncope.   GI  No heartburn, indigestion, abdominal pain, nausea, vomiting, diarrhea, change in bowel habits, loss of appetite, bloody stools.   Resp:  .  No chest wall deformity  Skin: no rash or lesions.  GU: no dysuria, change in color of urine, no urgency or frequency.  No flank pain, no hematuria   MS:  No joint pain or swelling.  No decreased range of motion.  No back pain.    Physical Exam  BP 112/60 (BP Location: Left Arm, Cuff Size: Normal)   Pulse 68   Ht 5\' 3"  (1.6 m)   Wt 140 lb 12.8 oz (63.9 kg)   LMP 07/31/1990   SpO2 98%   BMI 24.94 kg/m   GEN: A/Ox3; pleasant , NAD, elderly    HEENT:  [place]/AT,  EACs-clear, TMs-wnl, NOSE-clear, THROAT-clear, no lesions, no postnasal drip or exudate noted.   NECK:  Supple w/ fair ROM; no JVD; normal carotid impulses w/o bruits; no thyromegaly or nodules palpated; no lymphadenopathy.    RESP  Clear  P & A; w/o, wheezes/ rales/ or rhonchi. no accessory muscle use, no dullness to percussion  CARD:  RRR, no m/r/g, tr peripheral edema, pulses  intact, no cyanosis or clubbing.  GI:   Soft & nt; nml bowel sounds; no organomegaly or masses detected.   Musco: Warm bil, no deformities or joint swelling noted.   Neuro: alert, no focal deficits noted.    Skin: Warm, shoulder dressing noted.     Lab Results:  CBC   BNP No results found for: BNP  ProBNP No results found for: PROBNP  Imaging: Ct Abdomen Pelvis Wo Contrast  Result Date: 03/13/2017 CLINICAL DATA:  Back and shoulder pain EXAM: CT ABDOMEN AND PELVIS WITHOUT CONTRAST TECHNIQUE: Multidetector CT imaging of the abdomen and pelvis was performed following the standard protocol without IV contrast. COMPARISON:  03/12/2017, 07/18/2015 FINDINGS: Lower chest: No consolidation or pleural effusion is seen. Mild bronchiectasis and scarring in the right middle lobe and lingula. Mild right lower lobe subpleural interstitial density and slight nodularity. Heart size within normal limits. Hepatobiliary: Liver partially obscured by artifact. No calcified gallstones or biliary dilatation Pancreas: Unremarkable. No pancreatic ductal dilatation or surrounding inflammatory changes. Spleen: Normal in size without focal abnormality. Adrenals/Urinary Tract: Adrenal glands are within normal limits. Punctate nonobstructing stone in the mid right kidney. Punctate nonobstructing stone in the lower pole left kidney on coronal views. 1.2 cm cyst mid right kidney. Negative for hydronephrosis or ureteral stone. Bladder within normal limits. Stomach/Bowel: Stomach is within normal limits. Appendix appears normal. No evidence of bowel wall thickening, distention, or inflammatory changes. Scattered sigmoid colon diverticula without acute inflammation Vascular/Lymphatic: No significant vascular findings are present. No enlarged abdominal or pelvic lymph nodes. Reproductive: Status post hysterectomy. No adnexal masses. Other: Negative for free air or free fluid Musculoskeletal: No acute or suspicious bone lesion  IMPRESSION: 1. Punctate nonobstructing stones in the kidneys. Negative for hydronephrosis or ureteral stone. 2. Scattered sigmoid colon diverticula without acute inflammation 3. Mild right lower lobe subpleural interstitial density and nodularity, could reflect small airways infection or possible atypical pneumonia. Electronically Signed   By: Donavan Foil M.D.   On: 03/13/2017 01:33   Dg Chest 2 View  Result Date: 03/13/2017 CLINICAL DATA:  Found down for unknown amount of time, with bruising and  sores about the chest and back. Initial encounter. EXAM: CHEST  2 VIEW COMPARISON:  Chest radiograph performed 03/06/2017 FINDINGS: The lungs are well-aerated and clear. There is no evidence of focal opacification, pleural effusion or pneumothorax. The heart is normal in size; the mediastinal contour is within normal limits. No acute osseous abnormalities are seen. IMPRESSION: No acute cardiopulmonary process seen. No displaced rib fractures identified. Electronically Signed   By: Garald Balding M.D.   On: 03/13/2017 00:44   Dg Chest 2 View  Result Date: 03/07/2017 CLINICAL DATA:  Cough.  Bronchiectasis. EXAM: CHEST  2 VIEW COMPARISON:  11/27/2016 chest radiograph. FINDINGS: Stable cardiomediastinal silhouette with normal heart size and aortic atherosclerosis. No pneumothorax. No pleural effusion. Scattered bronchiectasis and hazy reticular opacities are again noted in the mid to lower lungs bilaterally. Patchy opacity in the right middle lobe is again noted. These findings have not appreciably changed since 11/27/2016. No acute consolidative airspace disease. IMPRESSION: 1. No acute cardiopulmonary disease . 2. Stable patchy bronchiectasis and hazy reticular opacities in the mid to lower lungs bilaterally. Stable patchy right middle lobe opacity. 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 Electronically Signed   By: Ilona Sorrel M.D.    On: 03/07/2017 09:27   Dg Thoracic Spine 2 View  Result Date: 03/13/2017 CLINICAL DATA:  Found down for unknown amount of time, with upper back pain. Initial encounter. EXAM: THORACIC SPINE 2 VIEWS COMPARISON:  Chest radiograph performed 03/06/2017 FINDINGS: There is no evidence of fracture or subluxation. Vertebral bodies demonstrate normal height and alignment. Intervertebral disc spaces are preserved. The visualized portions of both lungs are clear. The mediastinum is unremarkable in appearance. IMPRESSION: No evidence of fracture or subluxation along the thoracic spine. Electronically Signed   By: Garald Balding M.D.   On: 03/13/2017 00:43   Dg Lumbar Spine Complete  Result Date: 03/13/2017 CLINICAL DATA:  Found down for unknown amount of time, with lower back pain. Initial encounter. EXAM: LUMBAR SPINE - COMPLETE 4+ VIEW COMPARISON:  CT of the abdomen and pelvis from 07/18/2015 FINDINGS: There is no evidence of fracture or subluxation. Vertebral bodies demonstrate normal height and alignment. Intervertebral disc spaces are preserved. The visualized neural foramina are grossly unremarkable in appearance. The visualized bowel gas pattern is unremarkable in appearance; air and stool are noted within the colon. The sacroiliac joints are within normal limits. IMPRESSION: No evidence of fracture or subluxation along the lumbar spine. Electronically Signed   By: Garald Balding M.D.   On: 03/13/2017 00:42   Ct Head Wo Contrast  Result Date: 03/13/2017 CLINICAL DATA:  Altered level of consciousness.  Patient found down. EXAM: CT HEAD WITHOUT CONTRAST CT CERVICAL SPINE WITHOUT CONTRAST TECHNIQUE: Multidetector CT imaging of the head and cervical spine was performed following the standard protocol without intravenous contrast. Multiplanar CT image reconstructions of the cervical spine were also generated. COMPARISON:  Head and cervical spine CT 07/18/2015 FINDINGS: CT HEAD FINDINGS Brain: Generalized  cerebral atrophy.No intracranial hemorrhage, mass effect, or midline shift. No hydrocephalus. The basilar cisterns are patent. No evidence of territorial infarct. No extra-axial or intracranial fluid collection. Vascular: Atherosclerosis of skullbase vasculature without hyperdense vessel or abnormal calcification. Skull: No skull fracture.  No focal lesion. Sinuses/Orbits: Mucosal thickening of scattered ethmoid air cells. Fluid level in right and left sphenoid sinus and right maxillary sinus. Mastoid air cells are well-aerated. Visualized orbits are unremarkable. Other: None. CT CERVICAL SPINE FINDINGS Alignment:  Trace anterolisthesis of C3 on C4 is unchanged from prior exam. There is widening and irregularity of the left C3-C4 facet that is new from prior exam but appears chronic. No traumatic subluxation. Skull base and vertebrae: No acute fracture. Vertebral body heights are maintained. Skull base and dens are intact. Soft tissues and spinal canal: No prevertebral fluid or swelling. No visible canal hematoma. Disc levels: Disc space narrowing and endplate spurring at Y7-C6 and to a lesser extent C4-C5. Upper chest: A few scattered ground-glass opacities at the left greater than right lung apex, new from chest CT 01/11/2017 Other: None. IMPRESSION: 1.  No acute intracranial abnormality.  No skull fracture. 2. No acute fracture or subluxation of the cervical spine. 3. Widening of the left C3-C4 facet with associated joint space irregularity suggests inflammatory or degenerative arthropathy. This does not appear traumatic. MRI could be considered if there is history of neck pain. 4. Nonspecific left upper lobe ground-glass opacities, atypical infection versus pulmonary edema. Electronically Signed   By: Jeb Levering M.D.   On: 03/13/2017 01:00   Ct Cervical Spine Wo Contrast  Result Date: 03/13/2017 CLINICAL DATA:  Altered level of consciousness.  Patient found down. EXAM: CT HEAD WITHOUT CONTRAST CT  CERVICAL SPINE WITHOUT CONTRAST TECHNIQUE: Multidetector CT imaging of the head and cervical spine was performed following the standard protocol without intravenous contrast. Multiplanar CT image reconstructions of the cervical spine were also generated. COMPARISON:  Head and cervical spine CT 07/18/2015 FINDINGS: CT HEAD FINDINGS Brain: Generalized cerebral atrophy.No intracranial hemorrhage, mass effect, or midline shift. No hydrocephalus. The basilar cisterns are patent. No evidence of territorial infarct. No extra-axial or intracranial fluid collection. Vascular: Atherosclerosis of skullbase vasculature without hyperdense vessel or abnormal calcification. Skull: No skull fracture.  No focal lesion. Sinuses/Orbits: Mucosal thickening of scattered ethmoid air cells. Fluid level in right and left sphenoid sinus and right maxillary sinus. Mastoid air cells are well-aerated. Visualized orbits are unremarkable. Other: None. CT CERVICAL SPINE FINDINGS Alignment: Trace anterolisthesis of C3 on C4 is unchanged from prior exam. There is widening and irregularity of the left C3-C4 facet that is new from prior exam but appears chronic. No traumatic subluxation. Skull base and vertebrae: No acute fracture. Vertebral body heights are maintained. Skull base and dens are intact. Soft tissues and spinal canal: No prevertebral fluid or swelling. No visible canal hematoma. Disc levels: Disc space narrowing and endplate spurring at C3-J6 and to a lesser extent C4-C5. Upper chest: A few scattered ground-glass opacities at the left greater than right lung apex, new from chest CT 01/11/2017 Other: None. IMPRESSION: 1.  No acute intracranial abnormality.  No skull fracture. 2. No acute fracture or subluxation of the cervical spine. 3. Widening of the left C3-C4 facet with associated joint space irregularity suggests inflammatory or degenerative arthropathy. This does not appear traumatic. MRI could be considered if there is history of  neck pain. 4. Nonspecific left upper lobe ground-glass opacities, atypical infection versus pulmonary edema. Electronically Signed   By: Jeb Levering M.D.   On: 03/13/2017 01:00   US Renal  Result Date: 03/14/2017 CLINICAL DATA:  Acute kidney injury. EXAM: RENAL / URINARY TRACT ULTRASOUND COMPLETE COMPARISON:  Noncontrast CT yesterday. FINDINGS: Right Kidney: Length: 12.2 cm. No hydronephrosis. There is a 1.8 cm cyst in the interpolar kidney. Small shadowing calculus in the mid upper kidney. Renal echogenicity is normal. Trace perinephric fluid laterally. Left Kidney: Length: 10.9 cm. Echogenicity within normal limits. No mass or hydronephrosis visualized.  Small stone on prior CT not appreciated sonographically. Bladder: Foley catheter in place, bladder not visualized. IMPRESSION: 1. Nonobstructing right nephrolithiasis.  No hydronephrosis. 2. Trace right perinephric fluid, nonspecific. Electronically Signed   By: Jeb Levering M.D.   On: 03/14/2017 23:30   Dg Chest Port 1 View  Result Date: 03/21/2017 CLINICAL DATA:  Shortness of breath . EXAM: PORTABLE CHEST 1 VIEW COMPARISON:  03/12/2017. FINDINGS: Mild cardiomegaly with mild bilateral pulmonary interstitial prominence. Mild CHF cannot be excluded. Pneumonitis cannot be excluded. Mild right mid lung field and bibasilar subsegmental atelectasis No pleural effusion or pneumothorax . No pneumothorax. IMPRESSION: 1. Mild cardiomegaly with mild bilateral interstitial prominence. Mild CHF cannot be excluded. Mild pneumonitis cannot be excluded. 2. Low lung volumes. Mild right mid lung field and bibasilar subsegmental atelectasis. Electronically Signed   By: Marcello Moores  Register   On: 03/21/2017 09:13     Assessment & Plan:   Cough variant asthma vs UACS Stable currently  Patient's medications were reviewed today and patient education was given. Computerized medication calendar was adjusted/completed   Plan  Patient Instructions  Labs today .    Follow med calendar closely and bring to each visit .  Follow up with Dr. Melvyn Novas  In 2 months and .As needed       Bronchiectasis assoc with MPNs CT chest ? MAI  No acute sx . If active sx , consider sputum cx w/ AFB  Check IGE/RAST  Continue to monitor .   Plan  Patient Instructions  Labs today .  Follow med calendar closely and bring to each visit .  Follow up with Dr. Melvyn Novas  In 2 months and .As needed          Rexene Edison, NP 04/04/2017

## 2017-04-04 NOTE — Patient Instructions (Signed)
Medication Instructions:  NO CHANGES- Your physician recommends that you continue on your current medications as directed. Please refer to the Current Medication list given to you today.  If you need a refill on your cardiac medications before your next appointment, please call your pharmacy.  Follow-Up: Your physician wants you to follow-up in: 3 MONTHS WITH DR BERRY.    Thank you for choosing [facility]!!       

## 2017-04-04 NOTE — Progress Notes (Signed)
Cardiology Office Note   Date:  04/04/2017   ID:  Rachael Johnson, DOB February 09, 1951, MRN [id number]  PCP:  Mosie Lukes, MD  Cardiologist:  Dr Gwenlyn Found, 01/22/2015  Rosaria Ferries, PA-C   Chief Complaint  Patient presents with  . Follow-up    History of Present Illness: Rachael Johnson is a 66 y.o. female with a history of CP>>neg Lexi MV 2016, nl echo 2016, anemia, depression, GERD, thrombocytopenia, OA, HTN, restless legs, hypothyroid.   Admitted 08/13-08/24 for rhabdo (CK>32,000), ARF (Cr >7), from fall w/ LOC, elevated trop (21.50), prolonged QT, elevated LFTs. Dr Gwenlyn Found stated elevated troponin was secondary to her rhabdomyolysis and no further workup needed to be done.  Rachael Johnson presents for cardiology follow up.  She has weaned herself off some of her medications, including some of her antidepressants and sleeping meds.  She has not had chest pain or SOB. She is walking with a cane. She denies LE edema, orthopnea or PND. She slept very well last pm. No palpitations, no presyncope or syncope.  She refuses PT, will get her children to walk with her. They help keep an eye on her.   She has not had any chest pain with ambulation. She is very weak at baseline, but denies any new dyspnea on exertion, orthopnea or PND.   Past Medical History:  Diagnosis Date  . Anemia, iron deficiency 07/08/2012  . Arthritis   . Cataract 12/21/2014  . Depression with anxiety 03/24/2011  . Diverticulitis   . Esophageal reflux 08/17/2013  . Essential thrombocythemia ([place]) 01/24/2011  . Frequent episodic tension-type headache   . Gastroenteritis 12/21/2014  . 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 GERD (gastroesophageal reflux disease)   . H. pylori infection 12/19/2012  . Hyperglycemia 12/17/2015  . Hypertension   . Iron deficiency anemia due to chronic blood loss 04/03/2017  . Other and unspecified hyperlipidemia 02/25/2013  . Restless leg syndrome 09/30/2014  . Rhabdomyolysis 02/2017  . Scabies  03/28/2015  . Seizure ([place])    childhood  . Thyroid disease     Past Surgical History:  Procedure Laterality Date  . ABDOMINAL HYSTERECTOMY     1992  . BREAST SURGERY  1992   biopsy, benign. Fibrocystic  . UMBILICAL HERNIA REPAIR N/A 09/25/2012   Procedure: HERNIA REPAIR UMBILICAL ADULT;  Surgeon: Harl Bowie, MD;  Location: [facility];  Service: General;  Laterality: N/A;    Medication Sig  . albuterol (VENTOLIN HFA) 108 (90 Base) MCG/ACT inhaler INHALE 2 PUFFS INTO THE LUNGS EVERY 6 (SIX) HOURS AS NEEDED FOR WHEEZING OR SHORTNESS OF BREATH.  Can use Ventolin, ProAir or Proventil whichever cheaper  . ALPRAZolam (XANAX) 1 MG tablet TAKE 1 TABLET BY MOUTH THREE TIMES A DAY AS NEEDED FOR ANXIETY  . aspirin EC 81 MG tablet Take 81 mg by mouth every morning.  . budesonide-formoterol (SYMBICORT) 80-4.5 MCG/ACT inhaler Inhale 2 puffs into the lungs 2 (two) times daily.  . busPIRone (BUSPAR) 10 MG tablet Take 1 tablet (10 mg total) by mouth 3 (three) times daily.  . cetirizine (ZYRTEC) 10 MG tablet Take 1 tablet (10 mg total) by mouth daily.  . chlorpheniramine (CHLOR-TRIMETON) 4 MG tablet Take 8 mg by mouth at bedtime.  . citalopram (CELEXA) 40 MG tablet Take 0.5 tablets (20 mg total) by mouth every morning.  . collagenase (SANTYL) ointment Apply topically daily.  . Cranberry 300 MG tablet Take 300 mg by mouth 2 (two) times daily.  Marland Kitchen  cyclobenzaprine (FLEXERIL) 10 MG tablet Take 1 tablet (10 mg total) by mouth 2 (two) times daily as needed for muscle spasms.  Marland Kitchen estradiol (ESTRACE) 0.5 MG tablet Take 1 tablet (0.5 mg total) by mouth 2 (two) times daily.  . fluticasone (FLONASE) 50 MCG/ACT nasal spray Place 2 sprays into both nostrils daily.  Marland Kitchen HYDROcodone-acetaminophen (NORCO) 5-325 MG tablet Take 1 tablet by mouth every 6 (six) hours as needed for severe pain. Take one tablet by mouth every 6 hours as needed for severe pain  . hydroxyurea (HYDREA) 500 MG capsule TAKE 2 CAPSULES BY MOUTH  EVERY DAY  . lactulose (CHRONULAC) 10 GM/15ML solution TAKE 2 TABLESPOONFULS (30 ML) BY MOUTH 3 TIMES A DAY FOR CONSTIPATION  . levothyroxine (SYNTHROID, LEVOTHROID) 88 MCG tablet TAKE 1 TABLET DAILY  . meclizine (ANTIVERT) 12.5 MG tablet TAKE 1 TABLET THREE TIMES A DAY AS NEEDED FOR NAUSEA OR DIZZINESS  . montelukast (SINGULAIR) 10 MG tablet Take 1 tablet (10 mg total) by mouth at bedtime.  . Multiple Vitamin (MULTIVITAMIN PO) Take 1 tablet by mouth every morning.   Marland Kitchen omeprazole (PRILOSEC) 40 MG capsule Take 40 mg by mouth 2 (two) times daily.  . promethazine (PHENERGAN) 25 MG tablet Take 1 tablet (25 mg total) by mouth every 6 (six) hours as needed for nausea or vomiting.  . ranitidine (ZANTAC) 300 MG tablet Take 1 tablet (300 mg total) by mouth at bedtime.  . RESTASIS 0.05 % ophthalmic emulsion Place 1 drop into both eyes 2 (two) times daily.   Marland Kitchen venlafaxine XR (EFFEXOR-XR) 75 MG 24 hr capsule TAKE ONE CAPSULE BY MOUTH DAILY WITH BREAKFAST  . Vitamin D, Ergocalciferol, (DRISDOL) 50000 units CAPS capsule Take 1 capsule (50,000 Units total) by mouth every 7 (seven) days.   No current facility-administered medications for this visit.     Allergies:   Patient has no known allergies.    Social History:  The patient  reports that she quit smoking about 26 years ago. Her smoking use included Cigarettes. She started smoking about 46 years ago. She has a 70.00 pack-year smoking history. She has never used smokeless tobacco. She reports that she does not drink alcohol or use drugs.   Family History:  The patient's family history includes Alcohol abuse in her brother; Arthritis in her brother, father, and mother; COPD in her brother; Cancer in her brother, brother, brother, father, mother, and sister; Cirrhosis in her brother; Heart disease in her brother and mother; Heart failure in her mother; Hypertension in her brother, father, mother, and son; Seizures in her sister; Stroke in her sister.     ROS:  Please see the history of present illness. All other systems are reviewed and negative.    PHYSICAL EXAM: VS:  BP 118/68   Pulse 78   Ht 5\' 3"  (1.6 m)   Wt 143 lb 6.4 oz (65 kg)   LMP 07/31/1990   SpO2 97%   BMI 25.40 kg/m  , BMI Body mass index is 25.4 kg/m. GEN: Well nourished, well developed, female in no acute distress  HEENT: normal for age  Neck: no JVD, no carotid bruit, no masses Cardiac: RRR; no murmur, no rubs, or gallops Respiratory:  clear to auscultation bilaterally, normal work of breathing GI: soft, nontender, nondistended, + BS MS: no deformity or atrophy; no edema; distal pulses are 2+ in all 4 extremities   Skin: warm and dry, no rash; right shoulder wound is covered with a dry dressing, not disturbed.  Neuro:  Strength and sensation are intact Psych: euthymic mood, full affect   EKG:  EKG is ordered today. The ekg ordered today demonstrates sinus rhythm, occasional PVCs, possible sinus arrhythmia, no acute ischemic changes  ECHO: 03/14/2017 - Left ventricle: The cavity size was normal. Wall thickness was   normal. Systolic function was normal. The estimated ejection   fraction was in the range of 55% to 60%. Wall motion was normal;   there were no regional wall motion abnormalities. Features are   consistent with a pseudonormal left ventricular filling pattern,   with concomitant abnormal relaxation and increased filling   pressure (grade 2 diastolic dysfunction). - Aortic valve: There was no stenosis. - Mitral valve: There was trivial regurgitation. - Right ventricle: The cavity size was normal. Systolic function   was normal. - Tricuspid valve: Peak RV-RA gradient (S): 41 mm Hg. - Pulmonary arteries: PA peak pressure: 56 mm Hg (S). - Systemic veins: IVC measured 2.1 cm with < 50% respirophasic   variation, suggesting RA pressure 15 mmHg. Impressions: - Normal LV size with EF 55-60%. Moderate diastolic dysfunction.   Normal RV size and  systolic function. Moderate pulmonary   hypertension. Dilated IVC suggestive of elevated RV filling   pressure.   Recent Labs: 03/13/2017: TSH 2.683 03/23/2017: Magnesium 1.7 04/03/2017: ALT(SGPT) 16; BUN, Bld 13; Creat 1.4; HGB 8.7; Platelets 717 Platelet count consistent in citrate; Potassium 3.5; Sodium 139    Lipid Panel    Component Value Date/Time   CHOL 161 02/12/2017 1512   TRIG 60.0 02/12/2017 1512   HDL 66.40 02/12/2017 1512   CHOLHDL 2 02/12/2017 1512   VLDL 12.0 02/12/2017 1512   LDLCALC 82 02/12/2017 1512     Wt Readings from Last 3 Encounters:  04/04/17 143 lb 6.4 oz (65 kg)  04/04/17 140 lb 12.8 oz (63.9 kg)  04/03/17 140 lb 6.4 oz (63.7 kg)     Other studies Reviewed: Additional studies/ records that were reviewed today include: Hospital records and testing.  ASSESSMENT AND PLAN:  1.  Elevated troponin: She has had no chest pain since discharge. She is trying to increase her activity, but because of musculoskeletal issues, it is slow going. Her EF was normal on echocardiogram with no wall motion abnormalities. This supports Dr. Kennon Holter conclusion that her elevated troponin was not ischemic in origin and no further workup is needed.  2. Prolonged QT: On ECG today, her QT is 426 ms. Continue to follow.  Current medicines are reviewed at length with the patient today.  The patient does not have concerns regarding medicines.  The following changes have been made:  no change  Labs/ tests ordered today include:  No orders of the defined types were placed in this encounter.    Disposition:   FU with Dr. Gwenlyn Found in 3 months.  Augusto Garbe  04/04/2017 2:26 PM    [facility] Phone: [phone]; Fax: [phone]  This note was written with the assistance of speech recognition software. Please excuse any transcriptional errors.

## 2017-04-04 NOTE — Assessment & Plan Note (Signed)
CT chest ? MAI  No acute sx . If active sx , consider sputum cx w/ AFB  Check IGE/RAST  Continue to monitor .   Plan  Patient Instructions  Labs today .  Follow med calendar closely and bring to each visit .  Follow up with Dr. Melvyn Novas  In 2 months and .As needed

## 2017-04-04 NOTE — Patient Instructions (Signed)
Labs today .  Follow med calendar closely and bring to each visit .  Follow up with Dr. Melvyn Novas  In 2 months and .As needed

## 2017-04-04 NOTE — Progress Notes (Signed)
Chart and office note reviewed in detail  > agree with a/p as outlined    

## 2017-04-05 ENCOUNTER — Ambulatory Visit (HOSPITAL_BASED_OUTPATIENT_CLINIC_OR_DEPARTMENT_OTHER): Payer: Medicare Other

## 2017-04-05 ENCOUNTER — Telehealth: Payer: Self-pay | Admitting: *Deleted

## 2017-04-05 DIAGNOSIS — D500 Iron deficiency anemia secondary to blood loss (chronic): Secondary | ICD-10-CM

## 2017-04-05 DIAGNOSIS — N181 Chronic kidney disease, stage 1: Secondary | ICD-10-CM

## 2017-04-05 DIAGNOSIS — L89890 Pressure ulcer of other site, unstageable: Secondary | ICD-10-CM | POA: Diagnosis not present

## 2017-04-05 DIAGNOSIS — M6282 Rhabdomyolysis: Secondary | ICD-10-CM | POA: Diagnosis not present

## 2017-04-05 DIAGNOSIS — I10 Essential (primary) hypertension: Secondary | ICD-10-CM | POA: Diagnosis not present

## 2017-04-05 DIAGNOSIS — M150 Primary generalized (osteo)arthritis: Secondary | ICD-10-CM | POA: Diagnosis not present

## 2017-04-05 DIAGNOSIS — N170 Acute kidney failure with tubular necrosis: Secondary | ICD-10-CM | POA: Diagnosis not present

## 2017-04-05 DIAGNOSIS — S0100XD Unspecified open wound of scalp, subsequent encounter: Secondary | ICD-10-CM | POA: Diagnosis not present

## 2017-04-05 DIAGNOSIS — L89112 Pressure ulcer of right upper back, stage 2: Secondary | ICD-10-CM | POA: Diagnosis not present

## 2017-04-05 LAB — RESPIRATORY ALLERGY PROFILE REGION II ~~LOC~~
Allergen, A. alternata, m6: <0.1 kU/L
Allergen, Cedar tree, t12: <0.1 kU/L
Allergen, Comm Silver Birch, t9: <0.1 kU/L
Allergen, Cottonwood, t14: <0.1 kU/L
Allergen, D pternoyssinus,d7: <0.1 kU/L
Allergen, Mouse Urine Protein, e78: <0.1 kU/L
Allergen, Mulberry, t76: <0.1 kU/L
Allergen, Oak,t7: <0.1 kU/L
Allergen, P. notatum, m1: <0.1 kU/L
Aspergillus fumigatus, m3: <0.1 kU/L
Bermuda Grass: <0.1 kU/L
Box Elder IgE: <0.1 kU/L
CLADOSPORIUM HERBARUM (M2) IGE: <0.1 kU/L
COMMON RAGWEED (SHORT) (W1) IGE: <0.1 kU/L
Cat Dander: 0.37 kU/L — ABNORMAL HIGH
Class: 0
Class: 0
Class: 0
Class: 0
Class: 0
Class: 0
Class: 0
Class: 0
Class: 0
Class: 0
Class: 0
Class: 0
Class: 0
Class: 0
Class: 0
Class: 0
Class: 0
Class: 0
Class: 0
Class: 0
Class: 0
Class: 0
Class: 1
Class: 1
Cockroach: <0.1 kU/L
D. farinae: <0.1 kU/L
Dog Dander: 0.37 kU/L — ABNORMAL HIGH
Elm IgE: <0.1 kU/L
IgE (Immunoglobulin E), Serum: 68 kU/L (ref <=114)
Johnson Grass: <0.1 kU/L
Pecan/Hickory Tree IgE: <0.1 kU/L
Rough Pigweed  IgE: <0.1 kU/L
Sheep Sorrel IgE: <0.1 kU/L
Timothy Grass: <0.1 kU/L

## 2017-04-05 LAB — INTERPRETATION:

## 2017-04-05 LAB — PREPARE RBC (CROSSMATCH)

## 2017-04-05 MED ORDER — SODIUM CHLORIDE 0.9 % IV SOLN
250.0000 mL | Freq: Once | INTRAVENOUS | Status: AC
  Administered 2017-04-05: 250 mL via INTRAVENOUS

## 2017-04-05 MED ORDER — ACETAMINOPHEN 325 MG PO TABS
650.0000 mg | ORAL_TABLET | Freq: Once | ORAL | Status: AC
  Administered 2017-04-05: 650 mg via ORAL

## 2017-04-05 MED ORDER — BUDESONIDE-FORMOTEROL FUMARATE 80-4.5 MCG/ACT IN AERO: 2.0000 | INHALATION_SPRAY | Freq: Two times a day (BID) | RESPIRATORY_TRACT | 0 refills | Status: DC

## 2017-04-05 MED ORDER — FUROSEMIDE 10 MG/ML IJ SOLN
20.0000 mg | Freq: Once | INTRAMUSCULAR | Status: AC
  Administered 2017-04-05: 10 mg via INTRAVENOUS

## 2017-04-05 MED ORDER — FUROSEMIDE 10 MG/ML IJ SOLN
INTRAMUSCULAR | Status: AC
  Filled 2017-04-05: qty 4

## 2017-04-05 MED ORDER — DIPHENHYDRAMINE HCL 25 MG PO CAPS
ORAL_CAPSULE | ORAL | Status: AC
  Filled 2017-04-05: qty 1

## 2017-04-05 MED ORDER — ACETAMINOPHEN 325 MG PO TABS
ORAL_TABLET | ORAL | Status: AC
  Filled 2017-04-05: qty 2

## 2017-04-05 MED ORDER — DIPHENHYDRAMINE HCL 25 MG PO CAPS
25.0000 mg | ORAL_CAPSULE | Freq: Once | ORAL | Status: AC
  Administered 2017-04-05: 25 mg via ORAL

## 2017-04-05 NOTE — Addendum Note (Signed)
Addended by: Della Goo C on: 04/05/2017 10:15 AM   Modules accepted: Orders

## 2017-04-05 NOTE — Telephone Encounter (Signed)
Received Home Health Certification and Plan of Care; forwarded to provider/SLS 09/06

## 2017-04-05 NOTE — Patient Instructions (Signed)

## 2017-04-05 NOTE — Addendum Note (Signed)
Addended by: Parke Poisson E on: 04/05/2017 08:42 AM   Modules accepted: Orders

## 2017-04-06 DIAGNOSIS — D539 Nutritional anemia, unspecified: Secondary | ICD-10-CM | POA: Diagnosis not present

## 2017-04-06 DIAGNOSIS — N179 Acute kidney failure, unspecified: Secondary | ICD-10-CM | POA: Diagnosis not present

## 2017-04-06 DIAGNOSIS — M150 Primary generalized (osteo)arthritis: Secondary | ICD-10-CM | POA: Diagnosis not present

## 2017-04-06 DIAGNOSIS — J479 Bronchiectasis, uncomplicated: Secondary | ICD-10-CM | POA: Diagnosis not present

## 2017-04-06 DIAGNOSIS — D473 Essential (hemorrhagic) thrombocythemia: Secondary | ICD-10-CM | POA: Diagnosis not present

## 2017-04-06 DIAGNOSIS — S0100XD Unspecified open wound of scalp, subsequent encounter: Secondary | ICD-10-CM | POA: Diagnosis not present

## 2017-04-06 DIAGNOSIS — M6282 Rhabdomyolysis: Secondary | ICD-10-CM | POA: Diagnosis not present

## 2017-04-06 DIAGNOSIS — L89890 Pressure ulcer of other site, unstageable: Secondary | ICD-10-CM | POA: Diagnosis not present

## 2017-04-06 DIAGNOSIS — L89112 Pressure ulcer of right upper back, stage 2: Secondary | ICD-10-CM | POA: Diagnosis not present

## 2017-04-06 DIAGNOSIS — I10 Essential (primary) hypertension: Secondary | ICD-10-CM | POA: Diagnosis not present

## 2017-04-06 LAB — BPAM RBC
Blood Product Expiration Date: 201810042359
Blood Product Expiration Date: 201810042359
ISSUE DATE / TIME: 201809060812
ISSUE DATE / TIME: 201809060812
Unit Type and Rh: 5100
Unit Type and Rh: 5100

## 2017-04-06 LAB — TYPE AND SCREEN
ABO/RH(D): O POS
Antibody Screen: NEGATIVE
Unit division: 0
Unit division: 0

## 2017-04-09 ENCOUNTER — Ambulatory Visit (INDEPENDENT_AMBULATORY_CARE_PROVIDER_SITE_OTHER): Payer: Medicare Other | Admitting: Family Medicine

## 2017-04-09 ENCOUNTER — Encounter: Payer: Self-pay | Admitting: Family Medicine

## 2017-04-09 DIAGNOSIS — R600 Localized edema: Secondary | ICD-10-CM | POA: Diagnosis not present

## 2017-04-09 DIAGNOSIS — S41001S Unspecified open wound of right shoulder, sequela: Secondary | ICD-10-CM | POA: Diagnosis not present

## 2017-04-09 DIAGNOSIS — R739 Hyperglycemia, unspecified: Secondary | ICD-10-CM | POA: Diagnosis not present

## 2017-04-09 DIAGNOSIS — D473 Essential (hemorrhagic) thrombocythemia: Secondary | ICD-10-CM | POA: Diagnosis not present

## 2017-04-09 DIAGNOSIS — N289 Disorder of kidney and ureter, unspecified: Secondary | ICD-10-CM

## 2017-04-09 DIAGNOSIS — I10 Essential (primary) hypertension: Secondary | ICD-10-CM

## 2017-04-09 DIAGNOSIS — E559 Vitamin D deficiency, unspecified: Secondary | ICD-10-CM

## 2017-04-09 DIAGNOSIS — D75839 Thrombocytosis, unspecified: Secondary | ICD-10-CM

## 2017-04-09 HISTORY — DX: Disorder of kidney and ureter, unspecified: N28.9

## 2017-04-09 MED ORDER — FUROSEMIDE 20 MG PO TABS: 20.0000 mg | ORAL_TABLET | Freq: Every day | ORAL | 3 refills | Status: DC | PRN

## 2017-04-09 MED ORDER — HYDROCODONE-ACETAMINOPHEN 5-325 MG PO TABS: 1.0000 | ORAL_TABLET | Freq: Four times a day (QID) | ORAL | 0 refills | Status: DC | PRN

## 2017-04-09 NOTE — Assessment & Plan Note (Signed)
From a fall. Is having  A wet to dry dressing placed daily, home health and family are doing this well

## 2017-04-09 NOTE — Assessment & Plan Note (Addendum)
Creatinine was as high as 7.18 in August aftera  Fall and lying on the floor for 19 hours and suffering from Rhabdomyolysis. She is set up with [facility] now and her creatinine last week was down to 1.4. She will continue to follow with [facility] for the time being and monitor her renal functions closely.

## 2017-04-09 NOTE — Patient Instructions (Signed)
Tylenol/Acetaminophen ES 500 mg 1-2 tabs twice daily every day Rhabdomyolysis Rhabdomyolysis is a condition that happens when muscle cells break down and release substances into the blood that can damage the kidneys. This condition happens because of damage to the muscles that move bones (skeletal muscle). When the skeletal muscles are damaged, substances inside the muscle cells go into the blood. One of these substances is a protein called myoglobin. Large amounts of myoglobin can cause kidney damage or kidney failure. Other substances that are released by muscle cells may upset the balance of the minerals (electrolytes) in your blood. This imbalance causes your blood to have too much acid (acidosis). What are the causes? This condition is caused by muscle damage. Muscle damage often happens because of:  Using your muscles too much.  An injury that crushes or squeezes a muscle too tightly.  Using illegal drugs, mainly cocaine.  Alcohol abuse.  Other possible causes include:  Prescription medicines, such as those that: ? Lower cholesterol (statins). ? Treat ADHD (attention deficit hyperactivity disorder) or help with weight loss (amphetamines). ? Treat pain (opiates).  Infections.  Muscle diseases that are passed down from parent to child (inherited).  High fever.  Heatstroke.  Not having enough fluids in your body (dehydration).  Seizures.  Surgery.  What increases the risk? This condition is more likely to develop in people who:  Have a family history of muscle disease.  Take part in extreme sports, such as running in marathons.  Have diabetes.  Are older.  Abuse drugs or alcohol.  What are the signs or symptoms? Symptoms of this condition vary. Some people have very few symptoms, and other people have many symptoms. The most common symptoms include:  Muscle pain and swelling.  Weak muscles.  Dark urine.  Feeling weak and tired.  Other symptoms  include:  Nausea and vomiting.  Fever.  Pain in the abdomen.  Pain in the joints.  Symptoms of complications from this condition include:  Heart rhythm that is not normal (arrhythmia).  Seizures.  Not urinating enough because of kidney failure.  Very low blood pressure (shock). Signs of shock include dizziness, blurry vision, and clammy skin.  Bleeding that is hard to stop or control.  How is this diagnosed? This condition is diagnosed based on your medical history, your symptoms, and a physical exam. Tests may also be done, including:  Blood tests.  Urine tests to check for myoglobin.  You may also have other tests to check for causes of muscle damage and to check for complications. How is this treated? Treatment for this condition helps to:  Make sure you have enough fluids in your body.  Lower the acid levels in your blood to reverse acidosis.  Protect your kidneys.  Treatment may include:  Fluids and medicines given through an IV tube that is inserted into one of your veins.  Medicines to lower acidosis or to bring back the balance of the minerals in your body.  Hemodialysis. This treatment uses an artificial kidney machine to filter your blood while you recover. You may have this if other treatments are not helping.  Follow these instructions at home:  Take over-the-counter and prescription medicines only as told by your health care provider.  Rest at home until your health care provider says that you can return to your normal activities.  Drink enough fluid to keep your urine clear or pale yellow.  Do not do activities that take a lot of effort (are strenuous). Ask  your health care provider what level of exercise is safe for you.  Do not abuse drugs or alcohol. If you are having problems with drug or alcohol use, ask your health care provider for help.  Keep all follow-up visits as told by your health care provider. This is important. Contact a health  care provider if:  You start having symptoms of this condition after treatment. Get help right away if:  You have a seizure.  You bleed easily or cannot control bleeding.  You cannot urinate.  You have chest pain.  You have trouble breathing. This information is not intended to replace advice given to you by your health care provider. Make sure you discuss any questions you have with your health care provider. Document Released: 06/29/2004 Document Revised: 04/28/2016 Document Reviewed: 04/28/2016 Elsevier Interactive Patient Education  [employer].

## 2017-04-09 NOTE — Progress Notes (Signed)
Subjective:  I acted as a Education administrator for Dr. Charlett Blake. [name], [place]  Patient ID: Rachael Johnson, female    DOB: 10/20/1950, 66 y.o.   MRN: [id number]  Chief Complaint  Patient presents with  . Hospitalization Follow-up    Pt states that she's feeling better since leaving the ER but is still having body aches. Has has several changes to Rx list that will be reflected in chart.  . Medication Refill    HPI  Patient is in today for a 6 week follow up. She was recently hospitalized for a bad fall and episode of rhabdomyolysis. She doesn't recall falling but she severed a head injury in a very bad injury to her right shoulder. Her creatinine was above 7 by the time she was discovered and sent to the hospital. She saw nephrology last week and her creatinine had returned to 1.4. She continues to struggle with right shoulder pain and malaise and myalgias but is slowly improving. She has weakness and anorexia. She is changed her bandages to her right shoulder dealing with family and home health care assistance and it is healing slowly. No new or acute complaints but several medications including naproxen were discontinued secondary to her episode of rhabdo and acute kidney injury. Denies CP/palp/SOB/HA/congestion/fevers/GI or GU c/o. Taking meds as prescribed  Patient Care Team: Mosie Lukes, MD as PCP - General (Family Medicine) Marin Olp Rudell Cobb, MD as Consulting Physician (Oncology) Glenna Fellows, MD as Attending Physician (Neurosurgery)   Past Medical History:  Diagnosis Date  . Acute renal insufficiency 04/09/2017  . Anemia, iron deficiency 07/08/2012  . Arthritis   . Cataract 12/21/2014  . Depression with anxiety 03/24/2011  . Diverticulitis   . Esophageal reflux 08/17/2013  . Essential thrombocythemia ([place]) 01/24/2011  . Frequent episodic tension-type headache   . Gastroenteritis 12/21/2014  . 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 GERD (gastroesophageal reflux disease)   . H. pylori infection 12/19/2012  .  Hyperglycemia 12/17/2015  . Hypertension   . Iron deficiency anemia due to chronic blood loss 04/03/2017  . Other and unspecified hyperlipidemia 02/25/2013  . Restless leg syndrome 09/30/2014  . Rhabdomyolysis 02/2017  . Scabies 03/28/2015  . Seizure ([place])    childhood  . Shoulder wound, right, sequela 03/14/2017  . Thyroid disease     Past Surgical History:  Procedure Laterality Date  . ABDOMINAL HYSTERECTOMY     1992  . BREAST SURGERY  1992   biopsy, benign. Fibrocystic  . UMBILICAL HERNIA REPAIR N/A 09/25/2012   Procedure: HERNIA REPAIR UMBILICAL ADULT;  Surgeon: Harl Bowie, MD;  Location: [facility];  Service: General;  Laterality: N/A;    Family History  Problem Relation Age of Onset  . Arthritis Mother   . Cancer Mother        ovarian  . Hypertension Mother   . Heart disease Mother        pacer  . Heart failure Mother   . Arthritis Father   . Cancer Father        lung  . Hypertension Father   . Cancer Sister        lung  . Cancer Brother        prostate  . Cancer Brother        lung  . Hypertension Son   . COPD Brother   . Heart disease Brother        died from CHF  . Hypertension Brother   . Cancer Brother  colon  . Alcohol abuse Brother   . Cirrhosis Brother   . Seizures Sister   . Stroke Sister   . Arthritis Brother     Social History   Social History  . Marital status: Widowed    Spouse name: N/A  . Number of children: 2  . Years of education: N/A   Occupational History  . APL operator [employer]   Social History Main Topics  . Smoking status: Former Smoker    Packs/day: 3.50    Years: 20.00    Types: Cigarettes    Start date: 11/15/1970    Quit date: 07/31/1990  . Smokeless tobacco: Never Used     Comment: quit 23 years ago  . Alcohol use No  . Drug use: No  . Sexual activity: No   Other Topics Concern  . Not on file   Social History Narrative   Regular exercise: no   Caffeine Use: 2-3 weekly    Outpatient  Medications Prior to Visit  Medication Sig Dispense Refill  . albuterol (VENTOLIN HFA) 108 (90 Base) MCG/ACT inhaler INHALE 2 PUFFS INTO THE LUNGS EVERY 6 (SIX) HOURS AS NEEDED FOR WHEEZING OR SHORTNESS OF BREATH.  Can use Ventolin, ProAir or Proventil whichever cheaper 54 Inhaler 3  . ALPRAZolam (XANAX) 1 MG tablet TAKE 1 TABLET BY MOUTH THREE TIMES A DAY AS NEEDED FOR ANXIETY 180 tablet 1  . aspirin EC 81 MG tablet Take 81 mg by mouth every morning.    . budesonide-formoterol (SYMBICORT) 80-4.5 MCG/ACT inhaler Inhale 2 puffs into the lungs 2 (two) times daily. 1 Inhaler 0  . busPIRone (BUSPAR) 10 MG tablet Take 1 tablet (10 mg total) by mouth 3 (three) times daily. 90 tablet 0  . cetirizine (ZYRTEC) 10 MG tablet Take 1 tablet (10 mg total) by mouth daily. 90 tablet 1  . chlorpheniramine (CHLOR-TRIMETON) 4 MG tablet Take 8 mg by mouth at bedtime.    . collagenase (SANTYL) ointment Apply topically daily. 15 g 0  . Cranberry 300 MG tablet Take 300 mg by mouth 2 (two) times daily.    . cyclobenzaprine (FLEXERIL) 10 MG tablet Take 1 tablet (10 mg total) by mouth 2 (two) times daily as needed for muscle spasms. 60 tablet 3  . estradiol (ESTRACE) 0.5 MG tablet Take 1 tablet (0.5 mg total) by mouth 2 (two) times daily. 180 tablet 1  . fluticasone (FLONASE) 50 MCG/ACT nasal spray Place 2 sprays into both nostrils daily. 48 g 3  . hydroxyurea (HYDREA) 500 MG capsule TAKE 2 CAPSULES BY MOUTH EVERY DAY 60 capsule 0  . lactulose (CHRONULAC) 10 GM/15ML solution TAKE 2 TABLESPOONFULS (30 ML) BY MOUTH 3 TIMES A DAY FOR CONSTIPATION 1000 mL 8  . levothyroxine (SYNTHROID, LEVOTHROID) 88 MCG tablet TAKE 1 TABLET DAILY 30 tablet 2  . meclizine (ANTIVERT) 12.5 MG tablet TAKE 1 TABLET THREE TIMES A DAY AS NEEDED FOR NAUSEA OR DIZZINESS 30 tablet 1  . montelukast (SINGULAIR) 10 MG tablet Take 1 tablet (10 mg total) by mouth at bedtime. 90 tablet 3  . Multiple Vitamin (MULTIVITAMIN PO) Take 1 tablet by mouth every  morning.     . promethazine (PHENERGAN) 25 MG tablet Take 1 tablet (25 mg total) by mouth every 6 (six) hours as needed for nausea or vomiting. 60 tablet 4  . ranitidine (ZANTAC) 300 MG tablet Take 1 tablet (300 mg total) by mouth at bedtime. 90 tablet 1  . Vitamin D,  Ergocalciferol, (DRISDOL) 50000 units CAPS capsule Take 1 capsule (50,000 Units total) by mouth every 7 (seven) days. 12 capsule 1  . HYDROcodone-acetaminophen (NORCO) 5-325 MG tablet Take 1 tablet by mouth every 6 (six) hours as needed for severe pain. Take one tablet by mouth every 6 hours as needed for severe pain 120 tablet 0  . budesonide-formoterol (SYMBICORT) 80-4.5 MCG/ACT inhaler Inhale 2 puffs into the lungs 2 (two) times daily. 2 Inhaler 0  . citalopram (CELEXA) 40 MG tablet Take 0.5 tablets (20 mg total) by mouth every morning. 90 tablet 0  . omeprazole (PRILOSEC) 40 MG capsule Take 40 mg by mouth 2 (two) times daily.     No facility-administered medications prior to visit.     No Known Allergies  Review of Systems  Constitutional: Positive for malaise/fatigue. Negative for fever.  HENT: Negative for congestion.   Eyes: Negative for blurred vision.  Respiratory: Negative for cough and shortness of breath.   Cardiovascular: Positive for leg swelling. Negative for chest pain and palpitations.  Gastrointestinal: Negative for vomiting.  Musculoskeletal: Positive for myalgias. Negative for back pain.  Skin: Negative for rash.  Neurological: Negative for loss of consciousness and headaches.       Objective:    Physical Exam  Constitutional: She is oriented to person, place, and time. She appears well-developed and well-nourished. No distress.  HENT:  Head: Normocephalic and atraumatic.  Eyes: Conjunctivae are normal.  Neck: Normal range of motion. No thyromegaly present.  Cardiovascular: Normal rate and regular rhythm.   Pulmonary/Chest: Effort normal and breath sounds normal. She has no wheezes.  Abdominal:  Soft. Bowel sounds are normal. There is no tenderness.  Musculoskeletal: Normal range of motion. She exhibits no edema or deformity.  Neurological: She is alert and oriented to person, place, and time.  Skin: Skin is warm and dry. She is not diaphoretic.  Psychiatric: She has a normal mood and affect.    BP 139/71   Pulse 69   Temp 97.6 F (36.4 C) (Oral)   Ht '5\' 3"'  (1.6 m)   Wt 136 lb (61.7 kg)   LMP 07/31/1990   SpO2 95%   BMI 24.09 kg/m  Wt Readings from Last 3 Encounters:  04/09/17 136 lb (61.7 kg)  04/04/17 143 lb 6.4 oz (65 kg)  04/04/17 140 lb 12.8 oz (63.9 kg)   BP Readings from Last 3 Encounters:  04/09/17 139/71  04/05/17 119/67  04/04/17 118/68     Immunization History  Administered Date(s) Administered  . Influenza Whole 05/16/2013  . Influenza,inj,Quad PF,6+ Mos 04/02/2015, 04/18/2016  . Influenza-Unspecified 05/31/2014  . Pneumococcal Conjugate-13 04/04/2013  . Pneumococcal Polysaccharide-23 04/18/2016  . Pneumococcal-Unspecified 04/10/2011  . Td 08/01/2007  . Tdap 07/06/2014  . Zoster 04/28/2013    Health Maintenance  Topic Date Due  . Hepatitis C Screening  13-Nov-1950  . PAP SMEAR  10/30/2014  . INFLUENZA VACCINE  05/03/2017 (Originally 02/28/2017)  . MAMMOGRAM  11/03/2018  . COLONOSCOPY  11/28/2020  . PNA vac Low Risk Adult (2 of 2 - PPSV23) 04/18/2021  . TETANUS/TDAP  07/06/2024  . DEXA SCAN  Completed  . HIV Screening  Completed    Lab Results  Component Value Date   WBC 4.2 04/03/2017   HGB 8.7 (L) 04/03/2017   HCT 27.7 (L) 04/03/2017   PLT 717 Platelet count consistent in citrate (H) 04/03/2017   GLUCOSE 130 (H) 04/03/2017   CHOL 161 02/12/2017   TRIG 60.0 02/12/2017   HDL 66.40  02/12/2017   LDLCALC 82 02/12/2017   ALT 16 04/03/2017   AST 23 04/03/2017   NA 139 04/03/2017   K 3.5 04/03/2017   CL 104 04/03/2017   CREATININE 1.4 (H) 04/03/2017   BUN 13 04/03/2017   CO2 30 04/03/2017   TSH 2.683 03/13/2017   INR 1.23 03/13/2017     HGBA1C 5.3 02/12/2017    Lab Results  Component Value Date   TSH 2.683 03/13/2017   Lab Results  Component Value Date   WBC 4.2 04/03/2017   HGB 8.7 (L) 04/03/2017   HCT 27.7 (L) 04/03/2017   MCV 128 (H) 04/03/2017   PLT 717 Platelet count consistent in citrate (H) 04/03/2017   Lab Results  Component Value Date   NA 139 04/03/2017   K 3.5 04/03/2017   CHLORIDE 105 11/29/2016   CO2 30 04/03/2017   GLUCOSE 130 (H) 04/03/2017   BUN 13 04/03/2017   CREATININE 1.4 (H) 04/03/2017   BILITOT 0.50 04/03/2017   ALKPHOS 110 (H) 04/03/2017   AST 23 04/03/2017   ALT 16 04/03/2017   PROT 7.3 04/03/2017   ALBUMIN 3.1 (L) 04/03/2017   CALCIUM 8.2 04/03/2017   ANIONGAP 12 03/23/2017   EGFR 80 (L) 11/29/2016   GFR 86.39 02/12/2017   Lab Results  Component Value Date   CHOL 161 02/12/2017   Lab Results  Component Value Date   HDL 66.40 02/12/2017   Lab Results  Component Value Date   LDLCALC 82 02/12/2017   Lab Results  Component Value Date   TRIG 60.0 02/12/2017   Lab Results  Component Value Date   CHOLHDL 2 02/12/2017   Lab Results  Component Value Date   HGBA1C 5.3 02/12/2017         Assessment & Plan:   Problem List Items Addressed This Visit    Thrombocytosis ([place])    She did not get her Hydroxyurea while in hospital so her platelets spiked has now been corrected by Dr Marin Olp       Hyperglycemia    hgba1c acceptable, minimize simple carbs. Increase exercise as tolerated.       Vitamin D deficiency    Take daily supplements      Essential hypertension    Well controlled, no changes to meds. Encouraged heart healthy diet such as the DASH diet and exercise as tolerated.       Relevant Medications   furosemide (LASIX) 20 MG tablet   Pedal edema    Elevate feet, compression hose. Diuretics as prescribed and weigh self daily      Shoulder wound, right, sequela    From a fall. Is having  A wet to dry dressing placed daily, home health and  family are doing this well      Acute renal insufficiency    Creatinine was as high as 7.18 in August aftera  Fall and lying on the floor for 19 hours and suffering from Rhabdomyolysis. She is set up with [facility] now and her creatinine last week was down to 1.4. She will continue to follow with [facility] for the time being and monitor her renal functions closely.          I have discontinued Ms. Pavao's omeprazole and citalopram. I am also having her start on furosemide. Additionally, I am having her maintain her Multiple Vitamin (MULTIVITAMIN PO), aspirin EC, Cranberry, promethazine, albuterol, fluticasone, cyclobenzaprine, levothyroxine, meclizine, cetirizine, estradiol, ranitidine, lactulose, montelukast, Vitamin D (Ergocalciferol), ALPRAZolam, chlorpheniramine, hydroxyurea, busPIRone, collagenase,  budesonide-formoterol, and HYDROcodone-acetaminophen.  Meds ordered this encounter  Medications  . HYDROcodone-acetaminophen (NORCO) 5-325 MG tablet    Sig: Take 1 tablet by mouth every 6 (six) hours as needed for severe pain. Take one tablet by mouth every 6 hours as needed for severe pain    Dispense:  120 tablet    Refill:  0  . furosemide (LASIX) 20 MG tablet    Sig: Take 1 tablet (20 mg total) by mouth daily as needed.    Dispense:  30 tablet    Refill:  3    CMA served as scribe during this visit. History, Physical and Plan performed by medical provider. Documentation and orders reviewed and attested to.  Penni Homans, MD

## 2017-04-09 NOTE — Assessment & Plan Note (Signed)
She did not get her Hydroxyurea while in hospital so her platelets spiked has now been corrected by Dr Marin Olp

## 2017-04-10 ENCOUNTER — Other Ambulatory Visit: Payer: Self-pay | Admitting: Hematology & Oncology

## 2017-04-10 DIAGNOSIS — M150 Primary generalized (osteo)arthritis: Secondary | ICD-10-CM | POA: Diagnosis not present

## 2017-04-10 DIAGNOSIS — S0100XD Unspecified open wound of scalp, subsequent encounter: Secondary | ICD-10-CM | POA: Diagnosis not present

## 2017-04-10 DIAGNOSIS — I10 Essential (primary) hypertension: Secondary | ICD-10-CM | POA: Diagnosis not present

## 2017-04-10 DIAGNOSIS — L89890 Pressure ulcer of other site, unstageable: Secondary | ICD-10-CM | POA: Diagnosis not present

## 2017-04-10 DIAGNOSIS — M6282 Rhabdomyolysis: Secondary | ICD-10-CM | POA: Diagnosis not present

## 2017-04-10 DIAGNOSIS — L89112 Pressure ulcer of right upper back, stage 2: Secondary | ICD-10-CM | POA: Diagnosis not present

## 2017-04-10 DIAGNOSIS — D473 Essential (hemorrhagic) thrombocythemia: Secondary | ICD-10-CM

## 2017-04-10 NOTE — Assessment & Plan Note (Signed)
Take daily supplements 

## 2017-04-10 NOTE — Assessment & Plan Note (Signed)
Elevate feet, compression hose. Diuretics as prescribed and weigh self daily

## 2017-04-10 NOTE — Assessment & Plan Note (Signed)
hgba1c acceptable, minimize simple carbs. Increase exercise as tolerated.  

## 2017-04-10 NOTE — Assessment & Plan Note (Signed)
Well controlled, no changes to meds. Encouraged heart healthy diet such as the DASH diet and exercise as tolerated.  °

## 2017-04-12 ENCOUNTER — Telehealth: Payer: Self-pay | Admitting: Family Medicine

## 2017-04-12 ENCOUNTER — Telehealth: Payer: Self-pay | Admitting: *Deleted

## 2017-04-12 DIAGNOSIS — L89890 Pressure ulcer of other site, unstageable: Secondary | ICD-10-CM | POA: Diagnosis not present

## 2017-04-12 DIAGNOSIS — S0100XD Unspecified open wound of scalp, subsequent encounter: Secondary | ICD-10-CM | POA: Diagnosis not present

## 2017-04-12 DIAGNOSIS — M6282 Rhabdomyolysis: Secondary | ICD-10-CM | POA: Diagnosis not present

## 2017-04-12 DIAGNOSIS — M150 Primary generalized (osteo)arthritis: Secondary | ICD-10-CM | POA: Diagnosis not present

## 2017-04-12 DIAGNOSIS — I10 Essential (primary) hypertension: Secondary | ICD-10-CM | POA: Diagnosis not present

## 2017-04-12 DIAGNOSIS — L89112 Pressure ulcer of right upper back, stage 2: Secondary | ICD-10-CM | POA: Diagnosis not present

## 2017-04-12 NOTE — Telephone Encounter (Signed)
Caller name:Johnisha Lawn Relationship to patient: Can be [place]:  Reason for call:Requesting refill on hydrocodone

## 2017-04-12 NOTE — Telephone Encounter (Signed)
Spoke with pharmacy they stated she needs a prior authorization done.  Vilma Prader will cover the PA for this medication.

## 2017-04-12 NOTE — Telephone Encounter (Signed)
Received Physician Orders from [facility]; forwarded to provider/SLS 09/13  

## 2017-04-12 NOTE — Telephone Encounter (Deleted)
Requesting:[place] Contract:yes UDS:yes nxt scrn 06/24/17 Last OV:04/09/17 Next OV:05/24/17 Last Refill:   Please advise

## 2017-04-12 NOTE — Telephone Encounter (Signed)
Patient checking on the status of PA, patient states she completely out, please advise

## 2017-04-12 NOTE — Telephone Encounter (Signed)
Tried Doctor, general practice- spoke w/ Rachael Johnson- informed because I didn't have [place] Medicaid ID I could not start PA over phone and because PA is for hydrocodone-acetaminophen that the PA must be completed on [employer] portal or on paper. Form found online and printed. Spoke w/ [facility], received [place] Medicaid ID, form completed and faxed to [phone]. Awaiting determination.

## 2017-04-13 ENCOUNTER — Ambulatory Visit: Payer: Medicare Other

## 2017-04-13 ENCOUNTER — Ambulatory Visit (HOSPITAL_BASED_OUTPATIENT_CLINIC_OR_DEPARTMENT_OTHER): Payer: Medicare Other | Admitting: Hematology & Oncology

## 2017-04-13 ENCOUNTER — Ambulatory Visit: Payer: Self-pay | Admitting: Internal Medicine

## 2017-04-13 ENCOUNTER — Other Ambulatory Visit (HOSPITAL_BASED_OUTPATIENT_CLINIC_OR_DEPARTMENT_OTHER): Payer: Medicare Other

## 2017-04-13 VITALS — BP 130/73 | HR 84 | Temp 98.0°F | Resp 20 | Wt 133.0 lb

## 2017-04-13 DIAGNOSIS — N170 Acute kidney failure with tubular necrosis: Secondary | ICD-10-CM

## 2017-04-13 DIAGNOSIS — D500 Iron deficiency anemia secondary to blood loss (chronic): Secondary | ICD-10-CM | POA: Diagnosis not present

## 2017-04-13 DIAGNOSIS — D473 Essential (hemorrhagic) thrombocythemia: Secondary | ICD-10-CM

## 2017-04-13 DIAGNOSIS — D75839 Thrombocytosis, unspecified: Secondary | ICD-10-CM

## 2017-04-13 DIAGNOSIS — I10 Essential (primary) hypertension: Secondary | ICD-10-CM

## 2017-04-13 DIAGNOSIS — N181 Chronic kidney disease, stage 1: Secondary | ICD-10-CM

## 2017-04-13 LAB — CBC WITH DIFFERENTIAL (CANCER CENTER ONLY)
BASO#: 0 10*3/uL (ref 0.0–0.2)
BASO%: 1 % (ref 0.0–2.0)
EOS%: 6.5 % (ref 0.0–7.0)
Eosinophils Absolute: 0.3 10*3/uL (ref 0.0–0.5)
HCT: 38.2 % (ref 34.8–46.6)
HGB: 12.5 g/dL (ref 11.6–15.9)
LYMPH#: 0.9 10*3/uL (ref 0.9–3.3)
LYMPH%: 23.3 % (ref 14.0–48.0)
MCH: 37.4 pg — ABNORMAL HIGH (ref 26.0–34.0)
MCHC: 32.7 g/dL (ref 32.0–36.0)
MCV: 114 fL — ABNORMAL HIGH (ref 81–101)
MONO#: 0.3 10*3/uL (ref 0.1–0.9)
MONO%: 8.3 % (ref 0.0–13.0)
NEUT#: 2.4 10*3/uL (ref 1.5–6.5)
NEUT%: 60.9 % (ref 39.6–80.0)
Platelets: 454 10*3/uL — ABNORMAL HIGH (ref 145–400)
RBC: 3.34 10*6/uL — ABNORMAL LOW (ref 3.70–5.32)
RDW: 20.5 % — ABNORMAL HIGH (ref 11.1–15.7)
WBC: 4 10*3/uL (ref 3.9–10.0)

## 2017-04-13 LAB — IRON AND TIBC
%SAT: 76 % — ABNORMAL HIGH (ref 21–57)
Iron: 131 ug/dL (ref 41–142)
TIBC: 171 ug/dL — ABNORMAL LOW (ref 236–444)
UIBC: 40 ug/dL — ABNORMAL LOW (ref 120–384)

## 2017-04-13 LAB — CHCC SATELLITE - SMEAR

## 2017-04-13 LAB — FERRITIN: Ferritin: 1342 ng/ml — ABNORMAL HIGH (ref 9–269)
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
## 2017-04-17 NOTE — Telephone Encounter (Signed)
Again faxed PA form to [employer] at [phone], requesting urgent determination and requesting response be faxed to [phone] or [phone].

## 2017-04-26 NOTE — Telephone Encounter (Signed)
PA denied through Medicaid.

## 2017-04-30 ENCOUNTER — Other Ambulatory Visit (HOSPITAL_BASED_OUTPATIENT_CLINIC_OR_DEPARTMENT_OTHER): Payer: Medicare Other

## 2017-04-30 ENCOUNTER — Ambulatory Visit (HOSPITAL_BASED_OUTPATIENT_CLINIC_OR_DEPARTMENT_OTHER): Payer: Medicare Other | Admitting: Hematology & Oncology

## 2017-04-30 VITALS — BP 135/75 | HR 90 | Temp 98.3°F | Resp 20 | Wt 135.0 lb

## 2017-04-30 DIAGNOSIS — D473 Essential (hemorrhagic) thrombocythemia: Secondary | ICD-10-CM

## 2017-04-30 DIAGNOSIS — D500 Iron deficiency anemia secondary to blood loss (chronic): Secondary | ICD-10-CM

## 2017-04-30 DIAGNOSIS — D75839 Thrombocytosis, unspecified: Secondary | ICD-10-CM

## 2017-04-30 DIAGNOSIS — E031 Congenital hypothyroidism without goiter: Secondary | ICD-10-CM

## 2017-04-30 LAB — CBC WITH DIFFERENTIAL (CANCER CENTER ONLY)
BASO#: 0 10*3/uL (ref 0.0–0.2)
BASO%: 0.8 % (ref 0.0–2.0)
EOS%: 4.6 % (ref 0.0–7.0)
Eosinophils Absolute: 0.2 10*3/uL (ref 0.0–0.5)
HCT: 32.5 % — ABNORMAL LOW (ref 34.8–46.6)
HGB: 10.6 g/dL — ABNORMAL LOW (ref 11.6–15.9)
LYMPH#: 1 10*3/uL (ref 0.9–3.3)
LYMPH%: 21.3 % (ref 14.0–48.0)
MCH: 36.9 pg — ABNORMAL HIGH (ref 26.0–34.0)
MCHC: 32.6 g/dL (ref 32.0–36.0)
MCV: 113 fL — ABNORMAL HIGH (ref 81–101)
MONO#: 0.3 10*3/uL (ref 0.1–0.9)
MONO%: 5.6 % (ref 0.0–13.0)
NEUT#: 3.3 10*3/uL (ref 1.5–6.5)
NEUT%: 67.7 % (ref 39.6–80.0)
Platelets: 545 10*3/uL — ABNORMAL HIGH (ref 145–400)
RBC: 2.87 10*6/uL — ABNORMAL LOW (ref 3.70–5.32)
WBC: 4.8 10*3/uL (ref 3.9–10.0)

## 2017-04-30 LAB — CMP (CANCER CENTER ONLY)
ALT(SGPT): 19 U/L (ref 10–47)
AST: 21 U/L (ref 11–38)
Albumin: 3.5 g/dL (ref 3.3–5.5)
Alkaline Phosphatase: 80 U/L (ref 26–84)
BUN, Bld: 11 mg/dL (ref 7–22)
CO2: 32 mEq/L (ref 18–33)
Calcium: 8.6 mg/dL (ref 8.0–10.3)
Chloride: 102 mEq/L (ref 98–108)
Creat: 0.7 mg/dl (ref 0.6–1.2)
Glucose, Bld: 92 mg/dL (ref 73–118)
Potassium: 3.5 mEq/L (ref 3.3–4.7)
Sodium: 137 mEq/L (ref 128–145)
Total Bilirubin: 0.5 mg/dl (ref 0.20–1.60)
Total Protein: 7.1 g/dL (ref 6.4–8.1)

## 2017-04-30 NOTE — Progress Notes (Signed)
Hematology and Oncology Follow Up Visit  Rachael Johnson [id number] October 11, 1950 66 y.o. 04/30/2017   Principle Diagnosis:  Essential thrombocythemia - JAK2 negative. 2. Intermittent iron-deficiency anemia.  Current Therapy:   Hydrea 1000/1000/500 mg by mouth daily Aspirin 81 mg by mouth daily IV iron as indicated - last dose given on 04/03/2017      Interim History:  Rachael Johnson is back for followup. She is doing okay. She had a wonderful time at the beach. I'm glad that we got her to the beach in better shape. We saw her a couple weeks ago, her ferritin was 1300 with an iron saturation of 76%. We gave her blood and iron.  She was able to enjoy the beach. There went to [facility]. She was able to sit in the [place].  Her wound on the right shoulder is slowly healing. She has a dressing over this.  She's had no fever. She has had no melena. There's been no hematochezia.  Her appetite is doing better.  She's had no fever. She's had no leg swelling.   Overall, her performance status is now ECOG 1.  Medications:  Current Outpatient Prescriptions:  .  albuterol (VENTOLIN HFA) 108 (90 Base) MCG/ACT inhaler, INHALE 2 PUFFS INTO THE LUNGS EVERY 6 (SIX) HOURS AS NEEDED FOR WHEEZING OR SHORTNESS OF BREATH.  Can use Ventolin, ProAir or Proventil whichever cheaper, Disp: 54 Inhaler, Rfl: 3 .  ALPRAZolam (XANAX) 1 MG tablet, TAKE 1 TABLET BY MOUTH THREE TIMES A DAY AS NEEDED FOR ANXIETY, Disp: 180 tablet, Rfl: 1 .  aspirin EC 81 MG tablet, Take 81 mg by mouth every morning., Disp: , Rfl:  .  budesonide-formoterol (SYMBICORT) 80-4.5 MCG/ACT inhaler, Inhale 2 puffs into the lungs 2 (two) times daily., Disp: 1 Inhaler, Rfl: 0 .  busPIRone (BUSPAR) 10 MG tablet, Take 1 tablet (10 mg total) by mouth 3 (three) times daily., Disp: 90 tablet, Rfl: 0 .  cetirizine (ZYRTEC) 10 MG tablet, Take 1 tablet (10 mg total) by mouth daily., Disp: 90 tablet, Rfl: 1 .  chlorpheniramine (CHLOR-TRIMETON) 4 MG  tablet, Take 8 mg by mouth at bedtime., Disp: , Rfl:  .  collagenase (SANTYL) ointment, Apply topically daily., Disp: 15 g, Rfl: 0 .  Cranberry 300 MG tablet, Take 300 mg by mouth 2 (two) times daily., Disp: , Rfl:  .  cyclobenzaprine (FLEXERIL) 10 MG tablet, Take 1 tablet (10 mg total) by mouth 2 (two) times daily as needed for muscle spasms., Disp: 60 tablet, Rfl: 3 .  estradiol (ESTRACE) 0.5 MG tablet, Take 1 tablet (0.5 mg total) by mouth 2 (two) times daily., Disp: 180 tablet, Rfl: 1 .  fluticasone (FLONASE) 50 MCG/ACT nasal spray, Place 2 sprays into both nostrils daily., Disp: 48 g, Rfl: 3 .  furosemide (LASIX) 20 MG tablet, Take 1 tablet (20 mg total) by mouth daily as needed., Disp: 30 tablet, Rfl: 3 .  HYDROcodone-acetaminophen (NORCO) 5-325 MG tablet, Take 1 tablet by mouth every 6 (six) hours as needed for severe pain. Take one tablet by mouth every 6 hours as needed for severe pain, Disp: 120 tablet, Rfl: 0 .  hydroxyurea (HYDREA) 500 MG capsule, TAKE 2 CAPSULES BY MOUTH EVERY DAY, Disp: 60 capsule, Rfl: 0 .  lactulose (CHRONULAC) 10 GM/15ML solution, TAKE 2 TABLESPOONFULS (30 ML) BY MOUTH 3 TIMES A DAY FOR CONSTIPATION, Disp: 1000 mL, Rfl: 8 .  levothyroxine (SYNTHROID, LEVOTHROID) 88 MCG tablet, TAKE 1 TABLET DAILY, Disp: 30 tablet, Rfl:  2 .  meclizine (ANTIVERT) 12.5 MG tablet, TAKE 1 TABLET THREE TIMES A DAY AS NEEDED FOR NAUSEA OR DIZZINESS, Disp: 30 tablet, Rfl: 1 .  montelukast (SINGULAIR) 10 MG tablet, Take 1 tablet (10 mg total) by mouth at bedtime., Disp: 90 tablet, Rfl: 3 .  Multiple Vitamin (MULTIVITAMIN PO), Take 1 tablet by mouth every morning. , Disp: , Rfl:  .  promethazine (PHENERGAN) 25 MG tablet, Take 1 tablet (25 mg total) by mouth every 6 (six) hours as needed for nausea or vomiting., Disp: 60 tablet, Rfl: 4 .  ranitidine (ZANTAC) 300 MG tablet, Take 1 tablet (300 mg total) by mouth at bedtime., Disp: 90 tablet, Rfl: 1 .  Vitamin D, Ergocalciferol, (DRISDOL) 50000  units CAPS capsule, Take 1 capsule (50,000 Units total) by mouth every 7 (seven) days., Disp: 12 capsule, Rfl: 1  Allergies: No Known Allergies  Past Medical History, Surgical history, Social history, and Family History were reviewed and updated.  Review of Systems: As stated in the interim history  Physical Exam:  weight is 135 lb (61.2 kg). Her oral temperature is 98.3 F (36.8 C). Her blood pressure is 135/75 and her pulse is 90. Her respiration is 20 and oxygen saturation is 100%.   Her physical exam is as stated below. I examined Rachael Johnson. There is really no change from her exam we saw her a week or so ago.:  I examined Rachael Johnson. The results of my examination are noted below with appropriate changes:   Thin white female in no obvious distress. Head and neck exam shows pale conjunctiva. She has no oral lesions. There is no adenopathy in the neck. Lungs are clear. Cardiac exam regular rate and rhythm with no murmurs, rubs or bruits. Abdomen is soft. She has good bowel sounds. There is no fluid wave. There is no palpable liver or spleen tip. Back exam shows no tenderness over the spine, ribs or hips. Extremities shows no clubbing, cyanosis or edema. She may actually have a little bit of nonpitting edema in the lower legs. Skin exam shows a dressing over the right shoulder. She says that her skin broke down and wasn't necrotic. I'm not sure exactly what this means. Neurological exam shows no focal neurological deficits.  Lab Results  Component Value Date   WBC 4.8 04/30/2017   HGB 10.6 (L) 04/30/2017   HCT 32.5 (L) 04/30/2017   MCV 113 (H) 04/30/2017   PLT 545 (H) 04/30/2017     Chemistry      Component Value Date/Time   NA 137 04/30/2017 1315   NA 140 11/29/2016 0936   K 3.5 04/30/2017 1315   K 4.2 11/29/2016 0936   CL 102 04/30/2017 1315   CO2 32 04/30/2017 1315   CO2 27 11/29/2016 0936   BUN 11 04/30/2017 1315   BUN 18.3 11/29/2016 0936   CREATININE 0.7 04/30/2017  1315   CREATININE 0.8 11/29/2016 0936      Component Value Date/Time   CALCIUM 8.6 04/30/2017 1315   CALCIUM 8.9 11/29/2016 0936   ALKPHOS 80 04/30/2017 1315   ALKPHOS 77 11/29/2016 0936   AST 21 04/30/2017 1315   AST 19 11/29/2016 0936   ALT 19 04/30/2017 1315   ALT 10 11/29/2016 0936   BILITOT 0.50 04/30/2017 1315   BILITOT <0.22 11/29/2016 0936         Impression and Plan: Rachael Johnson is 66 year-old white female with essential thrombocythemia.   I am not sure why her  hemoglobin is dropping again. I have to believe that her iron levels are down. Her platelet count is back up which I think is from iron deficiency. I think that her MCV also is elevated from bleeding.  We will get her back in 2 weeks. We will see how everything looks in 2 weeks.  She will let us do a rectal exam we see her back if her levels are further down.   [name], MD 10/1/20186:04 PM

## 2017-05-01 ENCOUNTER — Telehealth: Payer: Self-pay | Admitting: *Deleted

## 2017-05-01 LAB — IRON AND TIBC
%SAT: 87 % — ABNORMAL HIGH (ref 21–57)
Iron: 152 ug/dL — ABNORMAL HIGH (ref 41–142)
TIBC: 174 ug/dL — ABNORMAL LOW (ref 236–444)
UIBC: 22 ug/dL — ABNORMAL LOW (ref 120–384)

## 2017-05-01 LAB — FERRITIN: Ferritin: 1180 ng/ml — ABNORMAL HIGH (ref 9–269)

## 2017-05-01 LAB — RETICULOCYTES: Reticulocyte Count: 0.9 % (ref 0.6–2.6)

## 2017-05-01 NOTE — Telephone Encounter (Addendum)
Patient is aware of results  ----- Message from [name], MD sent at 05/01/2017 10:56 AM EDT ----- Call - iron is ok!!  Rachael Johnson

## 2017-05-14 ENCOUNTER — Other Ambulatory Visit (HOSPITAL_BASED_OUTPATIENT_CLINIC_OR_DEPARTMENT_OTHER): Payer: Medicare Other

## 2017-05-14 ENCOUNTER — Ambulatory Visit (HOSPITAL_BASED_OUTPATIENT_CLINIC_OR_DEPARTMENT_OTHER): Payer: Medicare Other | Admitting: Hematology & Oncology

## 2017-05-14 VITALS — BP 136/78 | HR 82 | Temp 98.1°F | Resp 18 | Wt 132.0 lb

## 2017-05-14 DIAGNOSIS — D473 Essential (hemorrhagic) thrombocythemia: Secondary | ICD-10-CM | POA: Diagnosis not present

## 2017-05-14 DIAGNOSIS — E031 Congenital hypothyroidism without goiter: Secondary | ICD-10-CM | POA: Diagnosis not present

## 2017-05-14 DIAGNOSIS — D500 Iron deficiency anemia secondary to blood loss (chronic): Secondary | ICD-10-CM

## 2017-05-14 DIAGNOSIS — D75839 Thrombocytosis, unspecified: Secondary | ICD-10-CM

## 2017-05-14 LAB — CMP (CANCER CENTER ONLY)
ALT(SGPT): 17 U/L (ref 10–47)
AST: 22 U/L (ref 11–38)
Albumin: 3.6 g/dL (ref 3.3–5.5)
Alkaline Phosphatase: 67 U/L (ref 26–84)
BUN, Bld: 10 mg/dL (ref 7–22)
CO2: 31 mEq/L (ref 18–33)
Calcium: 9.7 mg/dL (ref 8.0–10.3)
Chloride: 102 mEq/L (ref 98–108)
Creat: 0.7 mg/dl (ref 0.6–1.2)
Glucose, Bld: 116 mg/dL (ref 73–118)
Potassium: 3.3 mEq/L (ref 3.3–4.7)
Sodium: 139 mEq/L (ref 128–145)
Total Bilirubin: 0.5 mg/dl (ref 0.20–1.60)
Total Protein: 7.4 g/dL (ref 6.4–8.1)

## 2017-05-14 LAB — CBC WITH DIFFERENTIAL (CANCER CENTER ONLY)
BASO#: 0 10*3/uL (ref 0.0–0.2)
BASO%: 0.9 % (ref 0.0–2.0)
EOS%: 4.4 % (ref 0.0–7.0)
Eosinophils Absolute: 0.2 10*3/uL (ref 0.0–0.5)
HCT: 30.1 % — ABNORMAL LOW (ref 34.8–46.6)
HGB: 10 g/dL — ABNORMAL LOW (ref 11.6–15.9)
LYMPH#: 1.1 10*3/uL (ref 0.9–3.3)
LYMPH%: 33.2 % (ref 14.0–48.0)
MCH: 37.7 pg — ABNORMAL HIGH (ref 26.0–34.0)
MCHC: 33.2 g/dL (ref 32.0–36.0)
MCV: 114 fL — ABNORMAL HIGH (ref 81–101)
MONO#: 0.2 10*3/uL (ref 0.1–0.9)
MONO%: 5.2 % (ref 0.0–13.0)
NEUT#: 1.9 10*3/uL (ref 1.5–6.5)
NEUT%: 56.3 % (ref 39.6–80.0)
Platelets: 397 10*3/uL (ref 145–400)
RBC: 2.65 10*6/uL — ABNORMAL LOW (ref 3.70–5.32)
RDW: 20.6 % — ABNORMAL HIGH (ref 11.1–15.7)
WBC: 3.4 10*3/uL — ABNORMAL LOW (ref 3.9–10.0)

## 2017-05-14 NOTE — Progress Notes (Signed)
Hematology and Oncology Follow Up Visit  Rachael Johnson [id number] February 08, 1951 66 y.o. 05/14/2017   Principle Diagnosis:  Essential thrombocythemia - JAK2 negative. 2. Intermittent iron-deficiency anemia.  Current Therapy:   Hydrea 1000/1000/500 mg by mouth daily - on hold      starting 05/14/2017 Aspirin 81 mg by mouth daily IV iron as indicated - last dose given on 04/03/2017      Interim History:  Ms.  Rachael Johnson is back for followup. The wound on her right shoulder is still healing up slowly. She has erythema around it. I wonder if the Hydrea that she is on is delaying the healing. I think we should stop the Hydrea and see how she does.  Her iron studies we saw her couple weeks ago showed a ferritin of 1180 with an iron saturation of 87%. Clearly, she is not iron deficient.  I will have to check her erythropoietin level. It is possible that she may have a low erythropoietin level that is causing this. Given all the stress that she is under, it is certain conceivable that this might happen.  She's had no obvious bleeding. There is no change in bowel or bladder habits. She's had no rashes. She's had no leg swelling. She's had no nausea or vomiting.  Overall, her performance status is now ECOG 1.  Medications:  Current Outpatient Prescriptions:  .  albuterol (VENTOLIN HFA) 108 (90 Base) MCG/ACT inhaler, INHALE 2 PUFFS INTO THE LUNGS EVERY 6 (SIX) HOURS AS NEEDED FOR WHEEZING OR SHORTNESS OF BREATH.  Can use Ventolin, ProAir or Proventil whichever cheaper, Disp: 54 Inhaler, Rfl: 3 .  ALPRAZolam (XANAX) 1 MG tablet, TAKE 1 TABLET BY MOUTH THREE TIMES A DAY AS NEEDED FOR ANXIETY, Disp: 180 tablet, Rfl: 1 .  aspirin EC 81 MG tablet, Take 81 mg by mouth every morning., Disp: , Rfl:  .  budesonide-formoterol (SYMBICORT) 80-4.5 MCG/ACT inhaler, Inhale 2 puffs into the lungs 2 (two) times daily., Disp: 1 Inhaler, Rfl: 0 .  busPIRone (BUSPAR) 10 MG tablet, Take 1 tablet (10 mg total) by mouth 3  (three) times daily., Disp: 90 tablet, Rfl: 0 .  cetirizine (ZYRTEC) 10 MG tablet, Take 1 tablet (10 mg total) by mouth daily., Disp: 90 tablet, Rfl: 1 .  chlorpheniramine (CHLOR-TRIMETON) 4 MG tablet, Take 8 mg by mouth at bedtime., Disp: , Rfl:  .  collagenase (SANTYL) ointment, Apply topically daily., Disp: 15 g, Rfl: 0 .  Cranberry 300 MG tablet, Take 300 mg by mouth 2 (two) times daily., Disp: , Rfl:  .  cyclobenzaprine (FLEXERIL) 10 MG tablet, Take 1 tablet (10 mg total) by mouth 2 (two) times daily as needed for muscle spasms., Disp: 60 tablet, Rfl: 3 .  estradiol (ESTRACE) 0.5 MG tablet, Take 1 tablet (0.5 mg total) by mouth 2 (two) times daily., Disp: 180 tablet, Rfl: 1 .  fluticasone (FLONASE) 50 MCG/ACT nasal spray, Place 2 sprays into both nostrils daily., Disp: 48 g, Rfl: 3 .  furosemide (LASIX) 20 MG tablet, Take 1 tablet (20 mg total) by mouth daily as needed., Disp: 30 tablet, Rfl: 3 .  HYDROcodone-acetaminophen (NORCO) 5-325 MG tablet, Take 1 tablet by mouth every 6 (six) hours as needed for severe pain. Take one tablet by mouth every 6 hours as needed for severe pain, Disp: 120 tablet, Rfl: 0 .  hydroxyurea (HYDREA) 500 MG capsule, TAKE 2 CAPSULES BY MOUTH EVERY DAY, Disp: 60 capsule, Rfl: 0 .  lactulose (CHRONULAC) 10 GM/15ML solution, TAKE  2 TABLESPOONFULS (30 ML) BY MOUTH 3 TIMES A DAY FOR CONSTIPATION, Disp: 1000 mL, Rfl: 8 .  levothyroxine (SYNTHROID, LEVOTHROID) 88 MCG tablet, TAKE 1 TABLET DAILY, Disp: 30 tablet, Rfl: 2 .  meclizine (ANTIVERT) 12.5 MG tablet, TAKE 1 TABLET THREE TIMES A DAY AS NEEDED FOR NAUSEA OR DIZZINESS, Disp: 30 tablet, Rfl: 1 .  montelukast (SINGULAIR) 10 MG tablet, Take 1 tablet (10 mg total) by mouth at bedtime., Disp: 90 tablet, Rfl: 3 .  Multiple Vitamin (MULTIVITAMIN PO), Take 1 tablet by mouth every morning. , Disp: , Rfl:  .  promethazine (PHENERGAN) 25 MG tablet, Take 1 tablet (25 mg total) by mouth every 6 (six) hours as needed for nausea or  vomiting., Disp: 60 tablet, Rfl: 4 .  ranitidine (ZANTAC) 300 MG tablet, Take 1 tablet (300 mg total) by mouth at bedtime., Disp: 90 tablet, Rfl: 1 .  Vitamin D, Ergocalciferol, (DRISDOL) 50000 units CAPS capsule, Take 1 capsule (50,000 Units total) by mouth every 7 (seven) days., Disp: 12 capsule, Rfl: 1  Allergies: No Known Allergies  Past Medical History, Surgical history, Social history, and Family History were reviewed and updated.  Review of Systems: As stated in the interim history  Physical Exam:  weight is 132 lb (59.9 kg). Her oral temperature is 98.1 F (36.7 C). Her blood pressure is 136/78 and her pulse is 82. Her respiration is 18 and oxygen saturation is 100%.   Thin white female in no obvious distress. Head and neck exam shows no ocular or oral lesions. Neck is supple with no lymph nodes. She has no palpable thyroid. Lungs are clear. Cardiac exam regular rate and rhythm with no murmurs, rubs or bruits. Abdomen is soft. She has good bowel sounds. There is no fluid wave. There is no palpable liver or spleen tip. Back exam shows the wound on the top of the right shoulder. It is healing by secondary intention. She does have quite a bit of surrounding erythema that is slightly tender. There is no exudate from the wound. Extremities shows no clubbing, cyanosis or edema. Neurological exam shows no focal neurological deficits.  Lab Results  Component Value Date   WBC 3.4 (L) 05/14/2017   HGB 10.0 (L) 05/14/2017   HCT 30.1 (L) 05/14/2017   MCV 114 (H) 05/14/2017   PLT 397 05/14/2017     Chemistry      Component Value Date/Time   NA 139 05/14/2017 1319   NA 140 11/29/2016 0936   K 3.3 05/14/2017 1319   K 4.2 11/29/2016 0936   CL 102 05/14/2017 1319   CO2 31 05/14/2017 1319   CO2 27 11/29/2016 0936   BUN 10 05/14/2017 1319   BUN 18.3 11/29/2016 0936   CREATININE 0.7 05/14/2017 1319   CREATININE 0.8 11/29/2016 0936      Component Value Date/Time   CALCIUM 9.7 05/14/2017  1319   CALCIUM 8.9 11/29/2016 0936   ALKPHOS 67 05/14/2017 1319   ALKPHOS 77 11/29/2016 0936   AST 22 05/14/2017 1319   AST 19 11/29/2016 0936   ALT 17 05/14/2017 1319   ALT 10 11/29/2016 0936   BILITOT 0.50 05/14/2017 1319   BILITOT <0.22 11/29/2016 0936         Impression and Plan: Ms. Pae is 66 year-old white female with essential thrombocythemia.   I'm going to stop the Hydrea for right now. This will, hopefully help with her hemoglobin and her white cell count. It also might help improve the healing  of the wound on her right shoulder. There is a lot of erythema around the wound.  I will like to see her back in 2 weeks. If the erythema is no better, then I think she will need some IV antibiotics. I would use Cubicin to try to help with the cellulitis.  Hopefully, we'll see her blood counts improving nicely.   [name], MD 10/15/20182:10 PM

## 2017-05-15 ENCOUNTER — Other Ambulatory Visit: Payer: Self-pay | Admitting: Hematology & Oncology

## 2017-05-15 DIAGNOSIS — D473 Essential (hemorrhagic) thrombocythemia: Secondary | ICD-10-CM

## 2017-05-15 LAB — FERRITIN: Ferritin: 980 ng/ml — ABNORMAL HIGH (ref 9–269)

## 2017-05-15 LAB — ERYTHROPOIETIN: Erythropoietin: 40.4 m[IU]/mL — ABNORMAL HIGH (ref 2.6–18.5)

## 2017-05-15 LAB — IRON AND TIBC
%SAT: 72 % — ABNORMAL HIGH (ref 21–57)
Iron: 137 ug/dL (ref 41–142)
TIBC: 190 ug/dL — ABNORMAL LOW (ref 236–444)
UIBC: 53 ug/dL — ABNORMAL LOW (ref 120–384)

## 2017-05-15 LAB — RETICULOCYTES: Reticulocyte Count: 1.5 % (ref 0.6–2.6)

## 2017-05-23 ENCOUNTER — Ambulatory Visit (INDEPENDENT_AMBULATORY_CARE_PROVIDER_SITE_OTHER): Payer: Medicare Other | Admitting: Internal Medicine

## 2017-05-23 ENCOUNTER — Encounter: Payer: Self-pay | Admitting: Internal Medicine

## 2017-05-23 VITALS — BP 130/70 | HR 95 | Ht 63.0 in | Wt 135.5 lb

## 2017-05-23 DIAGNOSIS — J45991 Cough variant asthma: Secondary | ICD-10-CM

## 2017-05-23 DIAGNOSIS — J479 Bronchiectasis, uncomplicated: Secondary | ICD-10-CM | POA: Diagnosis not present

## 2017-05-23 NOTE — Patient Instructions (Signed)
Plan A = Automatic = Symbicort 80 Take 2 puffs first thing in am and then another 2 puffs about 12 hours later.   Work on inhaler technique:  relax and gently blow all the way out then take a nice smooth deep breath back in, triggering the inhaler at same time you start breathing in.  Hold for up to 5 seconds if you can. Blow out thru nose. Rinse and gargle with water when done      Plan B = Backup Only use your albuterol as a rescue medication to be used if you can't catch your breath by resting or doing a relaxed purse lip breathing pattern.  - The less you use it, the better it will work when you need it. - Ok to use the inhaler up to 2 puffs  every 4 hours if you must but call for appointment if use goes up over your usual need - Don't leave home without it !!  (think of it like the spare tire for your car)    I strongly advise you to keep up with all your medications in writing in an updated, user friendly format we have provided you or some other equally precise format to prevent medication errors like the one that you just experienced with the use of naprosyn    If you are satisfied with your treatment plan,  let your doctor know and he/she can either refill your medications or you can return here when your prescription runs out.     If in any way you are not 100% satisfied,  please tell us.  If 100% better, tell your friends!  Pulmonary follow up is as needed
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
## 2017-05-24 ENCOUNTER — Encounter: Payer: Self-pay | Admitting: Family Medicine

## 2017-05-24 ENCOUNTER — Ambulatory Visit (INDEPENDENT_AMBULATORY_CARE_PROVIDER_SITE_OTHER): Payer: Medicare Other | Admitting: Family Medicine

## 2017-05-24 DIAGNOSIS — M1990 Unspecified osteoarthritis, unspecified site: Secondary | ICD-10-CM | POA: Diagnosis not present

## 2017-05-24 DIAGNOSIS — D539 Nutritional anemia, unspecified: Secondary | ICD-10-CM | POA: Diagnosis not present

## 2017-05-24 DIAGNOSIS — S41001S Unspecified open wound of right shoulder, sequela: Secondary | ICD-10-CM | POA: Diagnosis not present

## 2017-05-24 DIAGNOSIS — N179 Acute kidney failure, unspecified: Secondary | ICD-10-CM | POA: Diagnosis not present

## 2017-05-24 DIAGNOSIS — M159 Polyosteoarthritis, unspecified: Secondary | ICD-10-CM

## 2017-05-24 HISTORY — DX: Unspecified osteoarthritis, unspecified site: M19.90

## 2017-05-24 MED ORDER — ALPRAZOLAM 1 MG PO TABS: ORAL_TABLET | ORAL | 1 refills | Status: DC

## 2017-05-24 MED ORDER — HYDROCODONE-ACETAMINOPHEN 5-325 MG PO TABS: 1.0000 | ORAL_TABLET | Freq: Four times a day (QID) | ORAL | 0 refills | Status: DC | PRN

## 2017-05-24 MED ORDER — CYCLOBENZAPRINE HCL 10 MG PO TABS: 10.0000 mg | ORAL_TABLET | Freq: Two times a day (BID) | ORAL | 3 refills | Status: DC | PRN

## 2017-05-24 NOTE — Assessment & Plan Note (Signed)
Neck pain improved s/p fall

## 2017-05-24 NOTE — Assessment & Plan Note (Signed)
-  Spirometry 11/27/2016  wnl p am symbicort no curvature  -Sinus CT 12/14/2016 > neg - RAST 03/2017 mild pos Cat/Dog dander, IgE 68.  - 05/23/2017  After extensive coaching HFA effectiveness =    75% with baseline 25%   All goals of chronic asthma control met including optimal function and elimination of symptoms with minimal need for rescue therapy.  Contingencies discussed in full including contacting this office immediately if not controlling the symptoms using the rule of two's.      Pulmonary f/u can be prn    I had an extended discussion with the patient reviewing all relevant studies completed to date(including extensive notes in epic from last admit)  and  lasting 15 to 20 minutes of a 25 minute post hosp f/u office  visit    Each maintenance medication was reviewed in detail including most importantly the difference between maintenance and prns and under what circumstances the prns are to be triggered using an action plan format that is not reflected in the computer generated alphabetically organized AVS.    Please see AVS for specific instructions unique to this visit that I personally wrote and verbalized to the the pt in detail and then reviewed with pt  by my nurse highlighting any  changes in therapy recommended at today's visit to their plan of care.

## 2017-05-24 NOTE — Assessment & Plan Note (Signed)
Struggles with pain and stiffness diffusely especially since she had to stop the Celebrex and Naproxen. Hydrocodone and tylenol is helping some. Try topical lidocaine

## 2017-05-24 NOTE — Progress Notes (Signed)
Subjective:  I acted as a Education administrator for Dr. Charlett Blake. [name], [place]  Patient ID: Rachael Johnson, female    DOB: June 15, 1951, 66 y.o.   MRN: [id number]  No chief complaint on file.   HPI  Patient is in today for a 6 week follow up. She is reporting persistent fatigue and malaise. She is very tired and achy still status post her hospital admission for her fall and rhabdomyolysis. She's had no further falls. She does endorse she is slowly improving. The wound on her right shoulder is slowly improving and is now very superficial and small. No fevers or chills. She does have fatigue. Continues to follow closely with hematology. Is now established with nephrology. No other recent febrile illness or hospitalizations noted. Continues to struggle with daily pain and has been asked to stop all of her nonsteroidal anti-inflammatory meds so is relying on her hydrocodone. Denies CP/palp/SOB/HA/congestion/fevers/GI or GU c/o. Taking meds as prescribed  Patient Care Team: Mosie Lukes, MD as PCP - General (Family Medicine) Marin Olp Rudell Cobb, MD as Consulting Physician (Oncology) Glenna Fellows, MD as Attending Physician (Neurosurgery)   Past Medical History:  Diagnosis Date  . Acute renal insufficiency 04/09/2017  . Anemia, iron deficiency 07/08/2012  . Arthritis   . Cataract 12/21/2014  . Depression with anxiety 03/24/2011  . Diverticulitis   . Esophageal reflux 08/17/2013  . Essential thrombocythemia ([place]) 01/24/2011  . Frequent episodic tension-type headache   . Gastroenteritis 12/21/2014  . 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 GERD (gastroesophageal reflux disease)   . H. pylori infection 12/19/2012  . Hyperglycemia 12/17/2015  . Hypertension   . Iron deficiency anemia due to chronic blood loss 04/03/2017  . Osteoarthritis 05/24/2017  . Other and unspecified hyperlipidemia 02/25/2013  . Restless leg syndrome 09/30/2014  . Rhabdomyolysis 02/2017  . Scabies 03/28/2015  . Seizure ([place])    childhood  . Shoulder wound,  right, sequela 03/14/2017  . Thyroid disease     Past Surgical History:  Procedure Laterality Date  . ABDOMINAL HYSTERECTOMY     1992  . BREAST SURGERY  1992   biopsy, benign. Fibrocystic  . UMBILICAL HERNIA REPAIR N/A 09/25/2012   Procedure: HERNIA REPAIR UMBILICAL ADULT;  Surgeon: Harl Bowie, MD;  Location: [facility];  Service: General;  Laterality: N/A;    Family History  Problem Relation Age of Onset  . Arthritis Mother   . Cancer Mother        ovarian  . Hypertension Mother   . Heart disease Mother        pacer  . Heart failure Mother   . Arthritis Father   . Cancer Father        lung  . Hypertension Father   . Cancer Sister        lung  . Cancer Brother        prostate  . Cancer Brother        lung  . Hypertension Son   . COPD Brother   . Heart disease Brother        died from CHF  . Hypertension Brother   . Cancer Brother        colon  . Alcohol abuse Brother   . Cirrhosis Brother   . Seizures Sister   . Stroke Sister   . Arthritis Brother     Social History   Social History  . Marital status: Widowed    Spouse name: N/A  . Number of children: 2  .  Years of education: N/A   Occupational History  . APL operator [employer]   Social History Main Topics  . Smoking status: Former Smoker    Packs/day: 3.50    Years: 20.00    Types: Cigarettes    Start date: 11/15/1970    Quit date: 07/31/1990  . Smokeless tobacco: Never Used     Comment: quit 23 years ago  . Alcohol use No  . Drug use: No  . Sexual activity: No   Other Topics Concern  . Not on file   Social History Narrative   Regular exercise: no   Caffeine Use: 2-3 weekly    Outpatient Medications Prior to Visit  Medication Sig Dispense Refill  . albuterol (VENTOLIN HFA) 108 (90 Base) MCG/ACT inhaler INHALE 2 PUFFS INTO THE LUNGS EVERY 6 (SIX) HOURS AS NEEDED FOR WHEEZING OR SHORTNESS OF BREATH.  Can use Ventolin, ProAir or Proventil whichever cheaper 54 Inhaler 3  .  aspirin EC 81 MG tablet Take 81 mg by mouth every morning.    . budesonide-formoterol (SYMBICORT) 80-4.5 MCG/ACT inhaler Inhale 2 puffs into the lungs 2 (two) times daily. 1 Inhaler 0  . busPIRone (BUSPAR) 10 MG tablet Take 1 tablet (10 mg total) by mouth 3 (three) times daily. 90 tablet 0  . cetirizine (ZYRTEC) 10 MG tablet Take 1 tablet (10 mg total) by mouth daily. 90 tablet 1  . chlorpheniramine (CHLOR-TRIMETON) 4 MG tablet Take 8 mg by mouth at bedtime.    . collagenase (SANTYL) ointment Apply topically daily. 15 g 0  . Cranberry 300 MG tablet Take 300 mg by mouth 2 (two) times daily.    Marland Kitchen estradiol (ESTRACE) 0.5 MG tablet Take 1 tablet (0.5 mg total) by mouth 2 (two) times daily. 180 tablet 1  . fluticasone (FLONASE) 50 MCG/ACT nasal spray Place 2 sprays into both nostrils daily. 48 g 3  . furosemide (LASIX) 20 MG tablet Take 1 tablet (20 mg total) by mouth daily as needed. 30 tablet 3  . hydroxyurea (HYDREA) 500 MG capsule TAKE 2 CAPSULES BY MOUTH EVERY DAY 60 capsule 1  . lactulose (CHRONULAC) 10 GM/15ML solution TAKE 2 TABLESPOONFULS (30 ML) BY MOUTH 3 TIMES A DAY FOR CONSTIPATION 1000 mL 8  . levothyroxine (SYNTHROID, LEVOTHROID) 88 MCG tablet TAKE 1 TABLET DAILY 30 tablet 2  . meclizine (ANTIVERT) 12.5 MG tablet TAKE 1 TABLET THREE TIMES A DAY AS NEEDED FOR NAUSEA OR DIZZINESS 30 tablet 1  . montelukast (SINGULAIR) 10 MG tablet Take 1 tablet (10 mg total) by mouth at bedtime. 90 tablet 3  . Multiple Vitamin (MULTIVITAMIN PO) Take 1 tablet by mouth every morning.     . promethazine (PHENERGAN) 25 MG tablet Take 1 tablet (25 mg total) by mouth every 6 (six) hours as needed for nausea or vomiting. 60 tablet 4  . ranitidine (ZANTAC) 300 MG tablet Take 1 tablet (300 mg total) by mouth at bedtime. 90 tablet 1  . Vitamin D, Ergocalciferol, (DRISDOL) 50000 units CAPS capsule Take 1 capsule (50,000 Units total) by mouth every 7 (seven) days. 12 capsule 1  . ALPRAZolam (XANAX) 1 MG tablet TAKE 1  TABLET BY MOUTH THREE TIMES A DAY AS NEEDED FOR ANXIETY 180 tablet 1  . cyclobenzaprine (FLEXERIL) 10 MG tablet Take 1 tablet (10 mg total) by mouth 2 (two) times daily as needed for muscle spasms. 60 tablet 3  . HYDROcodone-acetaminophen (NORCO) 5-325 MG tablet Take 1 tablet by mouth every  6 (six) hours as needed for severe pain. Take one tablet by mouth every 6 hours as needed for severe pain 120 tablet 0   No facility-administered medications prior to visit.     No Known Allergies  Review of Systems  Constitutional: Positive for malaise/fatigue. Negative for fever.  HENT: Negative for congestion.   Eyes: Negative for blurred vision.  Respiratory: Negative for shortness of breath.   Cardiovascular: Negative for chest pain, palpitations and leg swelling.  Gastrointestinal: Negative for abdominal pain, blood in stool and nausea.  Genitourinary: Negative for dysuria and frequency.  Musculoskeletal: Positive for back pain, joint pain and myalgias. Negative for falls and neck pain.  Skin: Negative for rash.  Neurological: Negative for dizziness, loss of consciousness and headaches.  Endo/Heme/Allergies: Negative for environmental allergies.  Psychiatric/Behavioral: Negative for depression. The patient is not nervous/anxious.        Objective:    Physical Exam  Constitutional: She is oriented to person, place, and time. She appears well-developed and well-nourished. No distress.  HENT:  Head: Normocephalic and atraumatic.  Nose: Nose normal.  Eyes: Right eye exhibits no discharge. Left eye exhibits no discharge.  Neck: Normal range of motion. Neck supple.  Cardiovascular: Normal rate and regular rhythm.   No murmur heard. Pulmonary/Chest: Effort normal and breath sounds normal.  Abdominal: Soft. Bowel sounds are normal. There is no tenderness.  Musculoskeletal: She exhibits no edema.  Neurological: She is alert and oriented to person, place, and time.  Skin: Skin is warm and dry.    Psychiatric: She has a normal mood and affect.  Nursing note and vitals reviewed.   BP 128/80 (BP Location: Left Arm, Patient Position: Sitting, Cuff Size: Normal)   Pulse 91   Temp 98.7 F (37.1 C) (Oral)   Resp 18   Ht '5\' 3"'  (1.6 m)   Wt 137 lb 12.8 oz (62.5 kg)   LMP 07/31/1990   SpO2 98%   BMI 24.41 kg/m  Wt Readings from Last 3 Encounters:  05/24/17 137 lb 12.8 oz (62.5 kg)  05/23/17 135 lb 8 oz (61.5 kg)  05/14/17 132 lb (59.9 kg)   BP Readings from Last 3 Encounters:  05/24/17 128/80  05/23/17 130/70  05/14/17 136/78     Immunization History  Administered Date(s) Administered  . Influenza Whole 05/16/2013  . Influenza, High Dose Seasonal PF 04/30/2017  . Influenza,inj,Quad PF,6+ Mos 04/02/2015, 04/18/2016  . Influenza-Unspecified 05/31/2014  . Pneumococcal Conjugate-13 04/04/2013  . Pneumococcal Polysaccharide-23 04/18/2016  . Pneumococcal-Unspecified 04/10/2011  . Td 08/01/2007  . Tdap 07/06/2014  . Zoster 04/28/2013    Health Maintenance  Topic Date Due  . Hepatitis C Screening  [birth year]  . PAP SMEAR  10/30/2014  . INFLUENZA VACCINE  02/28/2017  . MAMMOGRAM  11/03/2018  . COLONOSCOPY  11/28/2020  . PNA vac Low Risk Adult (2 of 2 - PPSV23) 04/18/2021  . TETANUS/TDAP  07/06/2024  . DEXA SCAN  Completed  . HIV Screening  Completed    Lab Results  Component Value Date   WBC 3.4 (L) 05/14/2017   HGB 10.0 (L) 05/14/2017   HCT 30.1 (L) 05/14/2017   PLT 397 05/14/2017   GLUCOSE 116 05/14/2017   CHOL 161 02/12/2017   TRIG 60.0 02/12/2017   HDL 66.40 02/12/2017   LDLCALC 82 02/12/2017   ALT 17 05/14/2017   AST 22 05/14/2017   NA 139 05/14/2017   K 3.3 05/14/2017   CL 102 05/14/2017   CREATININE 0.7 05/14/2017  BUN 10 05/14/2017   CO2 31 05/14/2017   TSH 2.683 03/13/2017   INR 1.23 03/13/2017   HGBA1C 5.3 02/12/2017    Lab Results  Component Value Date   TSH 2.683 03/13/2017   Lab Results  Component Value Date   WBC 3.4 (L)  05/14/2017   HGB 10.0 (L) 05/14/2017   HCT 30.1 (L) 05/14/2017   MCV 114 (H) 05/14/2017   PLT 397 05/14/2017   Lab Results  Component Value Date   NA 139 05/14/2017   K 3.3 05/14/2017   CHLORIDE 105 11/29/2016   CO2 31 05/14/2017   GLUCOSE 116 05/14/2017   BUN 10 05/14/2017   CREATININE 0.7 05/14/2017   BILITOT 0.50 05/14/2017   ALKPHOS 67 05/14/2017   AST 22 05/14/2017   ALT 17 05/14/2017   PROT 7.4 05/14/2017   ALBUMIN 3.6 05/14/2017   CALCIUM 9.7 05/14/2017   ANIONGAP 12 03/23/2017   EGFR 80 (L) 11/29/2016   GFR 86.39 02/12/2017   Lab Results  Component Value Date   CHOL 161 02/12/2017   Lab Results  Component Value Date   HDL 66.40 02/12/2017   Lab Results  Component Value Date   LDLCALC 82 02/12/2017   Lab Results  Component Value Date   TRIG 60.0 02/12/2017   Lab Results  Component Value Date   CHOLHDL 2 02/12/2017   Lab Results  Component Value Date   HGBA1C 5.3 02/12/2017         Assessment & Plan:   Problem List Items Addressed This Visit    Generalized OA    Neck pain improved s/p fall      Relevant Medications   cyclobenzaprine (FLEXERIL) 10 MG tablet   HYDROcodone-acetaminophen (NORCO) 5-325 MG tablet   Macrocytic anemia    Is following with Dr Marin Olp       AKI (acute kidney injury) [facility])    Is following with nephrology and has improved      Shoulder wound, right, sequela    Slowly improving discussed the possibility of a wound clinic referral will hold off for now but if it worsens she will let us know so we can referl      Osteoarthritis    Struggles with pain and stiffness diffusely especially since she had to stop the Celebrex and Naproxen. Hydrocodone and tylenol is helping some. Try topical lidocaine      Relevant Medications   cyclobenzaprine (FLEXERIL) 10 MG tablet   HYDROcodone-acetaminophen (NORCO) 5-325 MG tablet      I am having Ms. Hauk maintain her Multiple Vitamin (MULTIVITAMIN PO), aspirin EC,  Cranberry, promethazine, albuterol, fluticasone, levothyroxine, meclizine, cetirizine, estradiol, ranitidine, lactulose, montelukast, Vitamin D (Ergocalciferol), chlorpheniramine, busPIRone, collagenase, budesonide-formoterol, furosemide, hydroxyurea, cyclobenzaprine, HYDROcodone-acetaminophen, and ALPRAZolam.  Meds ordered this encounter  Medications  . DISCONTD: HYDROcodone-acetaminophen (NORCO) 5-325 MG tablet    Sig: Take 1 tablet by mouth every 6 (six) hours as needed for severe pain. Take one tablet by mouth every 6 hours as needed for severe pain    Dispense:  120 tablet    Refill:  0  . DISCONTD: ALPRAZolam (XANAX) 1 MG tablet    Sig: TAKE 1 TABLET BY MOUTH THREE TIMES A DAY AS NEEDED FOR ANXIETY    Dispense:  180 tablet    Refill:  1  . cyclobenzaprine (FLEXERIL) 10 MG tablet    Sig: Take 1 tablet (10 mg total) by mouth 2 (two) times daily as needed for muscle spasms.    Dispense:  60 tablet    Refill:  3  . HYDROcodone-acetaminophen (NORCO) 5-325 MG tablet    Sig: Take 1 tablet by mouth every 6 (six) hours as needed for severe pain. Take one tablet by mouth every 6 hours as needed for severe pain    Dispense:  120 tablet    Refill:  0  . ALPRAZolam (XANAX) 1 MG tablet    Sig: TAKE 1 TABLET BY MOUTH THREE TIMES A DAY AS NEEDED FOR ANXIETY    Dispense:  180 tablet    Refill:  1    CMA served as scribe during this visit. History, Physical and Plan performed by medical provider. Documentation and orders reviewed and attested to.  Penni Homans, MD

## 2017-05-24 NOTE — Assessment & Plan Note (Signed)
Is following with nephrology and has improved

## 2017-05-24 NOTE — Assessment & Plan Note (Signed)
Is following with Dr Marin Olp

## 2017-05-24 NOTE — Assessment & Plan Note (Signed)
Slowly improving discussed the possibility of a wound clinic referral will hold off for now but if it worsens she will let us know so we can referl

## 2017-05-24 NOTE — Assessment & Plan Note (Signed)
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 2. Larger nodules seen in the right upper lobe on the prior study are far less distinct on today's examination, likely part of the same underlying chronic infectious process.   This is an extremely common benign condition in the elderly and does not warrant aggressive eval/ rx at this point unless there is a clinical correlation suggesting unaddressed pulmonary infection (purulent sputum, night sweats, unintended wt loss, doe) or evolution of  obvious changes on plain cxr (as opposed to serial CT, which is way over sensitive to make clinical decisions re intervention and treatment in the elderly, who tend to tolerate both dx and treatment poorly) .   I am concerned at this point that she has too many sources of care and trouble keeping up with meds (note did not list nsaids on med calendar here because "my primary prescribed it, he has an accurate list of my meds")   I tried to explained how dangerous it was to fail to disclose all meds to all sources of care.... To no avail.  In this setting I have no choice but withdraw from regular f/u care but happy to see prn at request of her pcp but must be return with all meds in hand using a trust but verify approach to confirm accurate Medication  Reconciliation The principal here is that until we are certain that the  patients are doing what we've asked, it makes no sense to ask them to do more.

## 2017-05-24 NOTE — Patient Instructions (Signed)

## 2017-05-28 ENCOUNTER — Ambulatory Visit (HOSPITAL_BASED_OUTPATIENT_CLINIC_OR_DEPARTMENT_OTHER): Payer: Medicare Other | Admitting: Hematology & Oncology

## 2017-05-28 ENCOUNTER — Ambulatory Visit: Payer: Medicare Other

## 2017-05-28 ENCOUNTER — Other Ambulatory Visit (HOSPITAL_BASED_OUTPATIENT_CLINIC_OR_DEPARTMENT_OTHER): Payer: Medicare Other

## 2017-05-28 VITALS — BP 124/73 | HR 107 | Temp 98.3°F | Resp 16 | Wt 132.0 lb

## 2017-05-28 DIAGNOSIS — D473 Essential (hemorrhagic) thrombocythemia: Secondary | ICD-10-CM

## 2017-05-28 DIAGNOSIS — D500 Iron deficiency anemia secondary to blood loss (chronic): Secondary | ICD-10-CM

## 2017-05-28 DIAGNOSIS — D75839 Thrombocytosis, unspecified: Secondary | ICD-10-CM

## 2017-05-28 LAB — CBC WITH DIFFERENTIAL (CANCER CENTER ONLY)
BASO#: 0 10*3/uL (ref 0.0–0.2)
BASO%: 0.7 % (ref 0.0–2.0)
EOS%: 3.4 % (ref 0.0–7.0)
Eosinophils Absolute: 0.2 10*3/uL (ref 0.0–0.5)
HCT: 34.6 % — ABNORMAL LOW (ref 34.8–46.6)
HGB: 11.4 g/dL — ABNORMAL LOW (ref 11.6–15.9)
LYMPH#: 1.2 10*3/uL (ref 0.9–3.3)
LYMPH%: 26.5 % (ref 14.0–48.0)
MCH: 38 pg — ABNORMAL HIGH (ref 26.0–34.0)
MCHC: 32.9 g/dL (ref 32.0–36.0)
MCV: 115 fL — ABNORMAL HIGH (ref 81–101)
MONO#: 0.3 10*3/uL (ref 0.1–0.9)
MONO%: 6.3 % (ref 0.0–13.0)
NEUT#: 2.8 10*3/uL (ref 1.5–6.5)
NEUT%: 63.1 % (ref 39.6–80.0)
Platelets: 564 10*3/uL — ABNORMAL HIGH (ref 145–400)
RBC: 3 10*6/uL — ABNORMAL LOW (ref 3.70–5.32)
WBC: 4.5 10*3/uL (ref 3.9–10.0)

## 2017-05-28 LAB — CHCC SATELLITE - SMEAR

## 2017-05-28 LAB — CMP (CANCER CENTER ONLY)
ALT(SGPT): 17 U/L (ref 10–47)
AST: 21 U/L (ref 11–38)
Albumin: 3.8 g/dL (ref 3.3–5.5)
Alkaline Phosphatase: 70 U/L (ref 26–84)
BUN, Bld: 18 mg/dL (ref 7–22)
CO2: 31 mEq/L (ref 18–33)
Calcium: 9.5 mg/dL (ref 8.0–10.3)
Chloride: 99 mEq/L (ref 98–108)
Creat: 1.1 mg/dl (ref 0.6–1.2)
Glucose, Bld: 83 mg/dL (ref 73–118)
Potassium: 3.5 mEq/L (ref 3.3–4.7)
Sodium: 141 mEq/L (ref 128–145)
Total Bilirubin: 0.6 mg/dl (ref 0.20–1.60)
Total Protein: 8 g/dL (ref 6.4–8.1)

## 2017-05-28 MED ORDER — ANAGRELIDE HCL 0.5 MG PO CAPS: 0.5000 mg | ORAL_CAPSULE | Freq: Every day | ORAL | 3 refills | Status: DC

## 2017-05-28 NOTE — Progress Notes (Signed)
Hematology and Oncology Follow Up Visit  Rachael Johnson [id number] Jan 19, 1951 66 y.o. 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 2. Intermittent iron-deficiency anemia.  Current Therapy:   Hydrea 1000/1000/500 mg by mouth daily - on hold      starting 05/14/2017 - DC'd on 05/28/2017 Anagrelide 0.5 mg by mouth daily-start on     05/28/2017 Aspirin 81 mg by mouth daily IV iron as indicated - last dose given on 04/03/2017      Interim History:  [name] is back for followup. She is feeling a little bit better. She is off Hydrea. Unfortunately, her platelet count is going up. As such, I think we have to restart her and get her on anagrelide.  She has had no bleeding. The wound on her right shoulder is still healing. Hopefully, this will heal up now that she is off Hydrea.  She's had no fever. She's had no cough or shortness of breath. She's had no change in bowel or bladder habits.  She's had no rashes. There's been no leg swelling.  Overall, her performance status is now ECOG 1.  Medications:  Current Outpatient Prescriptions:  .  albuterol (VENTOLIN HFA) 108 (90 Base) MCG/ACT inhaler, INHALE 2 PUFFS INTO THE LUNGS EVERY 6 (SIX) HOURS AS NEEDED FOR WHEEZING OR SHORTNESS OF BREATH.  Can use Ventolin, ProAir or Proventil whichever cheaper, Disp: 54 Inhaler, Rfl: 3 .  ALPRAZolam (XANAX) 1 MG tablet, TAKE 1 TABLET BY MOUTH THREE TIMES A DAY AS NEEDED FOR ANXIETY, Disp: 180 tablet, Rfl: 1 .  anagrelide (AGRYLIN) 0.5 MG capsule, Take 1 capsule (0.5 mg total) by mouth daily., Disp: 30 capsule, Rfl: 3 .  aspirin EC 81 MG tablet, Take 81 mg by mouth every morning., Disp: , Rfl:  .  budesonide-formoterol (SYMBICORT) 80-4.5 MCG/ACT inhaler, Inhale 2 puffs into the lungs 2 (two) times daily., Disp: 1 Inhaler, Rfl: 0 .  busPIRone (BUSPAR) 10 MG tablet, Take 1 tablet (10 mg total) by mouth 3 (three) times daily., Disp: 90 tablet, Rfl: 0 .  cetirizine (ZYRTEC)  10 MG tablet, Take 1 tablet (10 mg total) by mouth daily., Disp: 90 tablet, Rfl: 1 .  chlorpheniramine (CHLOR-TRIMETON) 4 MG tablet, Take 8 mg by mouth at bedtime., Disp: , Rfl:  .  collagenase (SANTYL) ointment, Apply topically daily., Disp: 15 g, Rfl: 0 .  Cranberry 300 MG tablet, Take 300 mg by mouth 2 (two) times daily., Disp: , Rfl:  .  cyclobenzaprine (FLEXERIL) 10 MG tablet, Take 1 tablet (10 mg total) by mouth 2 (two) times daily as needed for muscle spasms., Disp: 60 tablet, Rfl: 3 .  estradiol (ESTRACE) 0.5 MG tablet, Take 1 tablet (0.5 mg total) by mouth 2 (two) times daily., Disp: 180 tablet, Rfl: 1 .  fluticasone (FLONASE) 50 MCG/ACT nasal spray, Place 2 sprays into both nostrils daily., Disp: 48 g, Rfl: 3 .  furosemide (LASIX) 20 MG tablet, Take 1 tablet (20 mg total) by mouth daily as needed., Disp: 30 tablet, Rfl: 3 .  HYDROcodone-acetaminophen (NORCO) 5-325 MG tablet, Take 1 tablet by mouth every 6 (six) hours as needed for severe pain. Take one tablet by mouth every 6 hours as needed for severe pain, Disp: 120 tablet, Rfl: 0 .  lactulose (CHRONULAC) 10 GM/15ML solution, TAKE 2 TABLESPOONFULS (30 ML) BY MOUTH 3 TIMES A DAY FOR CONSTIPATION, Disp: 1000 mL, Rfl: 8 .  levothyroxine (SYNTHROID, LEVOTHROID) 88 MCG tablet, TAKE 1 TABLET DAILY, Disp:  30 tablet, Rfl: 2 .  meclizine (ANTIVERT) 12.5 MG tablet, TAKE 1 TABLET THREE TIMES A DAY AS NEEDED FOR NAUSEA OR DIZZINESS, Disp: 30 tablet, Rfl: 1 .  montelukast (SINGULAIR) 10 MG tablet, Take 1 tablet (10 mg total) by mouth at bedtime., Disp: 90 tablet, Rfl: 3 .  Multiple Vitamin (MULTIVITAMIN PO), Take 1 tablet by mouth every morning. , Disp: , Rfl:  .  pramipexole (MIRAPEX) 0.25 MG tablet, TAKE ONE OR TWO TABLETS BY MOUTH AT BEDTIME AS NEEDED, Disp: , Rfl: 1 .  promethazine (PHENERGAN) 25 MG tablet, Take 1 tablet (25 mg total) by mouth every 6 (six) hours as needed for nausea or vomiting., Disp: 60 tablet, Rfl: 4 .  ranitidine (ZANTAC) 300  MG tablet, Take 1 tablet (300 mg total) by mouth at bedtime., Disp: 90 tablet, Rfl: 1 .  RESTASIS 0.05 % ophthalmic emulsion, PLACE ONE DROP INTO EACH EYE EVERY 12 HOURS, Disp: , Rfl: 3 .  Vitamin D, Ergocalciferol, (DRISDOL) 50000 units CAPS capsule, Take 1 capsule (50,000 Units total) by mouth every 7 (seven) days., Disp: 12 capsule, Rfl: 1  Allergies: No Known Allergies  Past Medical History, Surgical history, Social history, and Family History were reviewed and updated.  Review of Systems: As stated in the interim history  Physical Exam:  weight is 132 lb (59.9 kg). Her oral temperature is 98.3 F (36.8 C). Her blood pressure is 124/73 and her pulse is 107 (abnormal). Her respiration is 16 and oxygen saturation is 97%.   I examined Rachael Johnson today. The exam is stated below with any appropriate changes:   Thin white female in no obvious distress. Head and neck exam shows no ocular or oral lesions. Neck is supple with no lymph nodes. She has no palpable thyroid. Lungs are clear. Cardiac exam regular rate and rhythm with no murmurs, rubs or bruits. Abdomen is soft. She has good bowel sounds. There is no fluid wave. There is no palpable liver or spleen tip. Back exam shows the wound on the top of the right shoulder. It is healing by secondary intention. She does have quite a bit of surrounding erythema that is slightly tender. There is no exudate from the wound. Extremities shows no clubbing, cyanosis or edema. Neurological exam shows no focal neurological deficits.  Lab Results  Component Value Date   WBC 4.5 05/28/2017   HGB 11.4 (L) 05/28/2017   HCT 34.6 (L) 05/28/2017   MCV 115 (H) 05/28/2017   PLT 564 (H) 05/28/2017     Chemistry      Component Value Date/Time   NA 141 05/28/2017 1251   NA 140 11/29/2016 0936   K 3.5 05/28/2017 1251   K 4.2 11/29/2016 0936   CL 99 05/28/2017 1251   CO2 31 05/28/2017 1251   CO2 27 11/29/2016 0936   BUN 18 05/28/2017 1251   BUN 18.3  11/29/2016 0936   CREATININE 1.1 05/28/2017 1251   CREATININE 0.8 11/29/2016 0936      Component Value Date/Time   CALCIUM 9.5 05/28/2017 1251   CALCIUM 8.9 11/29/2016 0936   ALKPHOS 70 05/28/2017 1251   ALKPHOS 77 11/29/2016 0936   AST 21 05/28/2017 1251   AST 19 11/29/2016 0936   ALT 17 05/28/2017 1251   ALT 10 11/29/2016 0936   BILITOT 0.60 05/28/2017 1251   BILITOT <0.22 11/29/2016 0936         Impression and Plan: Ms. Sperry is 66 year-old white female with essential thrombocythemia.  I'm going to switch her over to anagrelide. Hopefully, this will control her thrombocytosis and not affect the white cells or red cells.  We will start her on 0.5 mg daily and see how she does with this.  We will have to watch her blood counts every 2 weeks or so.  The wound on her right shoulder still is healing up. Maybe, when this finally heals up, her platelets will normalize. I do think that this is playing a role with respect to her blood counts.  I will like to see her back in one month.    [name], MD 10/29/20182:20 PM

## 2017-05-28 NOTE — Addendum Note (Signed)
Addended by: Burney Gauze R on: 05/28/2017 02:28 PM   Modules accepted: Orders

## 2017-05-29 LAB — IRON AND TIBC
%SAT: 45 % (ref 21–57)
Iron: 105 ug/dL (ref 41–142)
TIBC: 231 ug/dL — ABNORMAL LOW (ref 236–444)
UIBC: 126 ug/dL (ref 120–384)

## 2017-05-29 LAB — LACTATE DEHYDROGENASE: LDH: 182 U/L (ref 125–245)

## 2017-05-29 LAB — FERRITIN: Ferritin: 1008 ng/ml — ABNORMAL HIGH (ref 9–269)

## 2017-05-29 LAB — RETICULOCYTES: Reticulocyte Count: 3.1 % — ABNORMAL HIGH (ref 0.6–2.6)

## 2017-05-30 ENCOUNTER — Telehealth: Payer: Self-pay | Admitting: *Deleted

## 2017-05-30 NOTE — Telephone Encounter (Signed)
Received call from [place] office of drug interaction between Celexa and Anagrelide. There is a possibility of QT prolongation and increased risk of bleeding.   Reviewed with Dr Marin Olp. He believes the benefit outweighs the risk and wants patient to start anagrelide.   Alexis notified and she will proceed with filling the medication.

## 2017-06-04 ENCOUNTER — Other Ambulatory Visit: Payer: Self-pay | Admitting: Family Medicine

## 2017-06-11 ENCOUNTER — Other Ambulatory Visit (HOSPITAL_BASED_OUTPATIENT_CLINIC_OR_DEPARTMENT_OTHER): Payer: Medicare Other

## 2017-06-11 DIAGNOSIS — D500 Iron deficiency anemia secondary to blood loss (chronic): Secondary | ICD-10-CM | POA: Diagnosis not present

## 2017-06-11 DIAGNOSIS — D473 Essential (hemorrhagic) thrombocythemia: Secondary | ICD-10-CM

## 2017-06-11 LAB — CBC WITH DIFFERENTIAL (CANCER CENTER ONLY)
BASO#: 0 10*3/uL (ref 0.0–0.2)
BASO%: 0.7 % (ref 0.0–2.0)
EOS%: 2.6 % (ref 0.0–7.0)
Eosinophils Absolute: 0.1 10*3/uL (ref 0.0–0.5)
HCT: 33.6 % — ABNORMAL LOW (ref 34.8–46.6)
HGB: 10.9 g/dL — ABNORMAL LOW (ref 11.6–15.9)
LYMPH#: 1.2 10*3/uL (ref 0.9–3.3)
LYMPH%: 26.5 % (ref 14.0–48.0)
MCH: 38.2 pg — ABNORMAL HIGH (ref 26.0–34.0)
MCHC: 32.4 g/dL (ref 32.0–36.0)
MCV: 118 fL — ABNORMAL HIGH (ref 81–101)
MONO#: 0.3 10*3/uL (ref 0.1–0.9)
MONO%: 6.4 % (ref 0.0–13.0)
NEUT#: 2.9 10*3/uL (ref 1.5–6.5)
NEUT%: 63.8 % (ref 39.6–80.0)
Platelets: 909 10*3/uL — ABNORMAL HIGH (ref 145–400)
RBC: 2.85 10*6/uL — ABNORMAL LOW (ref 3.70–5.32)
RDW: 19.1 % — ABNORMAL HIGH (ref 11.1–15.7)
WBC: 4.5 10*3/uL (ref 3.9–10.0)

## 2017-06-25 ENCOUNTER — Other Ambulatory Visit: Payer: Self-pay

## 2017-06-25 ENCOUNTER — Encounter: Payer: Self-pay | Admitting: Hematology & Oncology

## 2017-06-25 ENCOUNTER — Other Ambulatory Visit (HOSPITAL_BASED_OUTPATIENT_CLINIC_OR_DEPARTMENT_OTHER): Payer: Medicare Other

## 2017-06-25 ENCOUNTER — Ambulatory Visit (HOSPITAL_BASED_OUTPATIENT_CLINIC_OR_DEPARTMENT_OTHER): Payer: Medicare Other | Admitting: Hematology & Oncology

## 2017-06-25 DIAGNOSIS — D500 Iron deficiency anemia secondary to blood loss (chronic): Secondary | ICD-10-CM

## 2017-06-25 DIAGNOSIS — D473 Essential (hemorrhagic) thrombocythemia: Secondary | ICD-10-CM

## 2017-06-25 LAB — CBC WITH DIFFERENTIAL (CANCER CENTER ONLY)
BASO#: 0 10*3/uL (ref 0.0–0.2)
BASO%: 0.9 % (ref 0.0–2.0)
EOS%: 3.7 % (ref 0.0–7.0)
Eosinophils Absolute: 0.2 10*3/uL (ref 0.0–0.5)
HCT: 31.8 % — ABNORMAL LOW (ref 34.8–46.6)
HGB: 10.2 g/dL — ABNORMAL LOW (ref 11.6–15.9)
LYMPH#: 1.2 10*3/uL (ref 0.9–3.3)
LYMPH%: 27.5 % (ref 14.0–48.0)
MCH: 38.8 pg — ABNORMAL HIGH (ref 26.0–34.0)
MCHC: 32.1 g/dL (ref 32.0–36.0)
MCV: 121 fL — ABNORMAL HIGH (ref 81–101)
MONO#: 0.4 10*3/uL (ref 0.1–0.9)
MONO%: 10 % (ref 0.0–13.0)
NEUT#: 2.5 10*3/uL (ref 1.5–6.5)
NEUT%: 57.9 % (ref 39.6–80.0)
Platelets: 874 10*3/uL — ABNORMAL HIGH (ref 145–400)
RBC: 2.63 10*6/uL — ABNORMAL LOW (ref 3.70–5.32)
RDW: 15.8 % — ABNORMAL HIGH (ref 11.1–15.7)
WBC: 4.3 10*3/uL (ref 3.9–10.0)

## 2017-06-25 LAB — CMP (CANCER CENTER ONLY)
ALT(SGPT): 12 U/L (ref 10–47)
AST: 23 U/L (ref 11–38)
Albumin: 3.5 g/dL (ref 3.3–5.5)
Alkaline Phosphatase: 72 U/L (ref 26–84)
BUN, Bld: 9 mg/dL (ref 7–22)
CO2: 30 mEq/L (ref 18–33)
Calcium: 8.7 mg/dL (ref 8.0–10.3)
Chloride: 106 mEq/L (ref 98–108)
Creat: 0.7 mg/dl (ref 0.6–1.2)
Glucose, Bld: 94 mg/dL (ref 73–118)
Potassium: 3.4 mEq/L (ref 3.3–4.7)
Sodium: 145 mEq/L (ref 128–145)
Total Bilirubin: 0.4 mg/dl (ref 0.20–1.60)
Total Protein: 7 g/dL (ref 6.4–8.1)

## 2017-06-25 LAB — IRON AND TIBC
%SAT: 37 % (ref 21–57)
Iron: 68 ug/dL (ref 41–142)
TIBC: 184 ug/dL — ABNORMAL LOW (ref 236–444)
UIBC: 116 ug/dL — ABNORMAL LOW (ref 120–384)

## 2017-06-25 LAB — RETICULOCYTES: Reticulocyte Count: 2.1 % (ref 0.6–2.6)

## 2017-06-25 LAB — FERRITIN: Ferritin: 706 ng/ml — ABNORMAL HIGH (ref 9–269)

## 2017-06-25 LAB — LACTATE DEHYDROGENASE: LDH: 188 U/L (ref 125–245)

## 2017-06-25 MED ORDER — SUVOREXANT 15 MG PO TABS: 15.0000 mg | ORAL_TABLET | Freq: Every evening | ORAL | 2 refills | Status: DC | PRN

## 2017-06-25 MED ORDER — AMOXICILLIN-POT CLAVULANATE 875-125 MG PO TABS: 1.0000 | ORAL_TABLET | Freq: Two times a day (BID) | ORAL | 0 refills | Status: DC

## 2017-06-25 MED ORDER — ANAGRELIDE HCL 0.5 MG PO CAPS: 0.5000 mg | ORAL_CAPSULE | Freq: Two times a day (BID) | ORAL | 3 refills | Status: DC

## 2017-06-25 NOTE — Progress Notes (Signed)
Hematology and Oncology Follow Up Visit  Rachael Johnson [id number] 1950-11-16 66 y.o. 06/25/2017   Principle Diagnosis:  Essential thrombocythemia - JAK2 negative. 2. Intermittent iron-deficiency anemia.  Current Therapy:   Hydrea 1000/1000/500 mg by mouth daily - on hold starting 05/14/2017 - DC'd on 05/28/2017 Anagrelide 0.5 mg by mouth twice daily-start on 06/25/2017 Aspirin 81 mg by mouth daily IV iron as indicated - last dose given on 04/03/2017      Interim History:  [name] is back for followup.  She is doing okay.  She had a very quiet Thanksgiving.  She was by herself.  She wanted to be by herself.  She made homemade soup.  She has done well with the anagrelide.  Her platelet count is slowly coming down.  She has not had any issues with side effects.  She is off her arthritis medicines.  She sees her doctor next month.  She wants to get back on something to help with her arthritis.  She is off Hydrea.  The wound on the right shoulder is healing in.  The process is quite slow.  It is still somewhat tender for her.  Her weight is up 7 pounds since we last saw her.  Some of this is fluid weight.  I told her to double her furosemide for the next 4 days.  She has had no fever.  She has had no nausea or vomiting.  She has had no diarrhea.  Overall, her performance status is now ECOG 1.  Medications:  Current Outpatient Medications:  .  albuterol (VENTOLIN HFA) 108 (90 Base) MCG/ACT inhaler, INHALE 2 PUFFS INTO THE LUNGS EVERY 6 (SIX) HOURS AS NEEDED FOR WHEEZING OR SHORTNESS OF BREATH.  Can use Ventolin, ProAir or Proventil whichever cheaper, Disp: 54 Inhaler, Rfl: 3 .  ALPRAZolam (XANAX) 1 MG tablet, TAKE 1 TABLET BY MOUTH THREE TIMES A DAY AS NEEDED FOR ANXIETY, Disp: 180 tablet, Rfl: 1 .  anagrelide (AGRYLIN) 0.5 MG capsule, Take 1 capsule (0.5 mg total) by mouth daily., Disp: 30 capsule, Rfl: 3 .  aspirin EC 81 MG tablet, Take 81 mg by mouth every morning., Disp: , Rfl:   .  budesonide-formoterol (SYMBICORT) 80-4.5 MCG/ACT inhaler, Inhale 2 puffs into the lungs 2 (two) times daily., Disp: 1 Inhaler, Rfl: 0 .  busPIRone (BUSPAR) 10 MG tablet, Take 1 tablet (10 mg total) by mouth 3 (three) times daily., Disp: 90 tablet, Rfl: 0 .  cetirizine (ZYRTEC) 10 MG tablet, Take 1 tablet (10 mg total) by mouth daily., Disp: 90 tablet, Rfl: 1 .  chlorpheniramine (CHLOR-TRIMETON) 4 MG tablet, Take 8 mg by mouth at bedtime., Disp: , Rfl:  .  collagenase (SANTYL) ointment, Apply topically daily., Disp: 15 g, Rfl: 0 .  Cranberry 300 MG tablet, Take 300 mg by mouth 2 (two) times daily., Disp: , Rfl:  .  cyclobenzaprine (FLEXERIL) 10 MG tablet, TAKE 1 TABLET (10 MG TOTAL) BY MOUTH 2 (TWO) TIMES DAILY AS NEEDED FOR MUSCLE SPASMS., Disp: 60 tablet, Rfl: 3 .  estradiol (ESTRACE) 0.5 MG tablet, Take 1 tablet (0.5 mg total) by mouth 2 (two) times daily., Disp: 180 tablet, Rfl: 1 .  fluticasone (FLONASE) 50 MCG/ACT nasal spray, Place 2 sprays into both nostrils daily., Disp: 48 g, Rfl: 3 .  furosemide (LASIX) 20 MG tablet, Take 1 tablet (20 mg total) by mouth daily as needed., Disp: 30 tablet, Rfl: 3 .  HYDROcodone-acetaminophen (NORCO) 5-325 MG tablet, Take 1 tablet by mouth  every 6 (six) hours as needed for severe pain. Take one tablet by mouth every 6 hours as needed for severe pain, Disp: 120 tablet, Rfl: 0 .  lactulose (CHRONULAC) 10 GM/15ML solution, TAKE 2 TABLESPOONFULS (30 ML) BY MOUTH 3 TIMES A DAY FOR CONSTIPATION, Disp: 1000 mL, Rfl: 8 .  levothyroxine (SYNTHROID, LEVOTHROID) 88 MCG tablet, TAKE 1 TABLET DAILY, Disp: 30 tablet, Rfl: 2 .  meclizine (ANTIVERT) 12.5 MG tablet, TAKE 1 TABLET THREE TIMES A DAY AS NEEDED FOR NAUSEA OR DIZZINESS, Disp: 30 tablet, Rfl: 1 .  montelukast (SINGULAIR) 10 MG tablet, Take 1 tablet (10 mg total) by mouth at bedtime., Disp: 90 tablet, Rfl: 3 .  Multiple Vitamin (MULTIVITAMIN PO), Take 1 tablet by mouth every morning. , Disp: , Rfl:  .   pramipexole (MIRAPEX) 0.25 MG tablet, TAKE ONE OR TWO TABLETS BY MOUTH AT BEDTIME AS NEEDED, Disp: , Rfl: 1 .  promethazine (PHENERGAN) 25 MG tablet, Take 1 tablet (25 mg total) by mouth every 6 (six) hours as needed for nausea or vomiting., Disp: 60 tablet, Rfl: 4 .  ranitidine (ZANTAC) 300 MG tablet, Take 1 tablet (300 mg total) by mouth at bedtime., Disp: 90 tablet, Rfl: 1 .  RESTASIS 0.05 % ophthalmic emulsion, PLACE ONE DROP INTO EACH EYE EVERY 12 HOURS, Disp: , Rfl: 3 .  venlafaxine XR (EFFEXOR-XR) 150 MG 24 hr capsule, , Disp: , Rfl:  .  Vitamin D, Ergocalciferol, (DRISDOL) 50000 units CAPS capsule, Take 1 capsule (50,000 Units total) by mouth every 7 (seven) days., Disp: 12 capsule, Rfl: 1  Allergies: No Known Allergies  Past Medical History, Surgical history, Social history, and Family History were reviewed and updated.  Review of Systems: As stated in the interim history  Physical Exam:  weight is 139 lb (63 kg). Her oral temperature is 97.7 F (36.5 C). Her blood pressure is 144/84 (abnormal) and her pulse is 99. Her respiration is 16 and oxygen saturation is 100%.   Thin white female in no obvious distress.  Head and neck exam shows no ocular or oral lesions.  There are no palpable cervical or supraclavicular lymph nodes.  Lungs are clear bilaterally.  Cardiac exam regular rate and rhythm with no murmurs, rubs or bruits.  Abdomen is soft.  She has good bowel sounds.  There is no fluid wave.  There is no palpable liver or spleen tip.  Back exam shows no kyphosis.  There is no tenderness over the spine ribs or hips.  Extremities shows the wound on the top of the right shoulder healing by secondary intention.  She does have some slight erythema on the lateral aspect of the wound that is slightly tender to touch.  Neurological exam is nonfocal.    Lab Results  Component Value Date   WBC 4.3 06/25/2017   HGB 10.2 (L) 06/25/2017   HCT 31.8 (L) 06/25/2017   MCV 121 (H) 06/25/2017   PLT  874 (H) 06/25/2017     Chemistry      Component Value Date/Time   NA 141 05/28/2017 1251   NA 140 11/29/2016 0936   K 3.5 05/28/2017 1251   K 4.2 11/29/2016 0936   CL 99 05/28/2017 1251   CO2 31 05/28/2017 1251   CO2 27 11/29/2016 0936   BUN 18 05/28/2017 1251   BUN 18.3 11/29/2016 0936   CREATININE 1.1 05/28/2017 1251   CREATININE 0.8 11/29/2016 0936      Component Value Date/Time   CALCIUM 9.5  05/28/2017 1251   CALCIUM 8.9 11/29/2016 0936   ALKPHOS 70 05/28/2017 1251   ALKPHOS 77 11/29/2016 0936   AST 21 05/28/2017 1251   AST 19 11/29/2016 0936   ALT 17 05/28/2017 1251   ALT 10 11/29/2016 0936   BILITOT 0.60 05/28/2017 1251   BILITOT <0.22 11/29/2016 0936         Impression and Plan: Rachael Johnson is 66 year-old white female with essential thrombocythemia.  I am going to increase her anagrelide to 0.5 mg twice daily dosing.  Hopefully this will help bring her platelet count down a little bit better.  I am going to try her on some Augmentin for the right shoulder wound.  It is healing up nicely from the inside.  Looks that there may be a little bit of cellulitis on the surface.  May be some Augmentin will help.  I will also have her on some BELSOMRA (15 mg p.o. nightly) to try to help with her sleep.  I would like to see her back in 3 weeks.  I think we have to keep in close touch with her with her blood counts.     [name], MD 11/26/201812:03 PM

## 2017-07-10 ENCOUNTER — Ambulatory Visit: Payer: Medicare Other | Admitting: Cardiovascular Disease

## 2017-07-16 ENCOUNTER — Ambulatory Visit (HOSPITAL_BASED_OUTPATIENT_CLINIC_OR_DEPARTMENT_OTHER): Payer: Medicare Other

## 2017-07-16 ENCOUNTER — Ambulatory Visit (HOSPITAL_BASED_OUTPATIENT_CLINIC_OR_DEPARTMENT_OTHER): Payer: Medicare Other | Admitting: Hematology & Oncology

## 2017-07-16 ENCOUNTER — Other Ambulatory Visit (HOSPITAL_BASED_OUTPATIENT_CLINIC_OR_DEPARTMENT_OTHER): Payer: Medicare Other

## 2017-07-16 ENCOUNTER — Encounter: Payer: Self-pay | Admitting: Hematology & Oncology

## 2017-07-16 ENCOUNTER — Other Ambulatory Visit: Payer: Self-pay

## 2017-07-16 VITALS — BP 97/70 | HR 112 | Temp 98.6°F | Resp 18 | Wt 137.0 lb

## 2017-07-16 DIAGNOSIS — D473 Essential (hemorrhagic) thrombocythemia: Secondary | ICD-10-CM | POA: Diagnosis not present

## 2017-07-16 DIAGNOSIS — D500 Iron deficiency anemia secondary to blood loss (chronic): Secondary | ICD-10-CM

## 2017-07-16 DIAGNOSIS — L03119 Cellulitis of unspecified part of limb: Secondary | ICD-10-CM | POA: Diagnosis present

## 2017-07-16 LAB — CMP (CANCER CENTER ONLY)
ALT(SGPT): 27 U/L (ref 10–47)
AST: 24 U/L (ref 11–38)
Albumin: 3.4 g/dL (ref 3.3–5.5)
Alkaline Phosphatase: 71 U/L (ref 26–84)
BUN, Bld: 10 mg/dL (ref 7–22)
CO2: 25 mEq/L (ref 18–33)
Calcium: 8.9 mg/dL (ref 8.0–10.3)
Chloride: 104 mEq/L (ref 98–108)
Creat: 0.8 mg/dl (ref 0.6–1.2)
Glucose, Bld: 112 mg/dL (ref 73–118)
Potassium: 3.2 mEq/L — ABNORMAL LOW (ref 3.3–4.7)
Sodium: 143 mEq/L (ref 128–145)
Total Bilirubin: 0.4 mg/dl (ref 0.20–1.60)
Total Protein: 7.1 g/dL (ref 6.4–8.1)

## 2017-07-16 LAB — CBC WITH DIFFERENTIAL (CANCER CENTER ONLY)
BASO#: 0.1 10*3/uL (ref 0.0–0.2)
BASO%: 1.3 % (ref 0.0–2.0)
EOS%: 6.9 % (ref 0.0–7.0)
Eosinophils Absolute: 0.3 10*3/uL (ref 0.0–0.5)
HCT: 36.1 % (ref 34.8–46.6)
HGB: 11.7 g/dL (ref 11.6–15.9)
LYMPH#: 1.3 10*3/uL (ref 0.9–3.3)
LYMPH%: 32.2 % (ref 14.0–48.0)
MCH: 38.1 pg — ABNORMAL HIGH (ref 26.0–34.0)
MCHC: 32.4 g/dL (ref 32.0–36.0)
MCV: 118 fL — ABNORMAL HIGH (ref 81–101)
MONO#: 0.3 10*3/uL (ref 0.1–0.9)
MONO%: 7.6 % (ref 0.0–13.0)
NEUT#: 2.1 10*3/uL (ref 1.5–6.5)
NEUT%: 52 % (ref 39.6–80.0)
Platelets: 935 10*3/uL — ABNORMAL HIGH (ref 145–400)
RBC: 3.07 10*6/uL — ABNORMAL LOW (ref 3.70–5.32)
RDW: 12.9 % (ref 11.1–15.7)
WBC: 3.9 10*3/uL (ref 3.9–10.0)

## 2017-07-16 LAB — IRON AND TIBC
%SAT: 44 % (ref 21–57)
Iron: 89 ug/dL (ref 41–142)
TIBC: 205 ug/dL — ABNORMAL LOW (ref 236–444)
UIBC: 116 ug/dL — ABNORMAL LOW (ref 120–384)

## 2017-07-16 LAB — FERRITIN: Ferritin: 876 ng/ml — ABNORMAL HIGH (ref 9–269)

## 2017-07-16 LAB — LACTATE DEHYDROGENASE: LDH: 172 U/L (ref 125–245)

## 2017-07-16 MED ORDER — ANAGRELIDE HCL 1 MG PO CAPS: 1.0000 mg | ORAL_CAPSULE | Freq: Two times a day (BID) | ORAL | 3 refills | Status: DC

## 2017-07-16 MED ORDER — SODIUM CHLORIDE 0.9 % IV SOLN
250.0000 mg | INTRAVENOUS | Status: DC
  Administered 2017-07-16: 250 mg via INTRAVENOUS
  Filled 2017-07-16: qty 5

## 2017-07-16 NOTE — Progress Notes (Signed)
Hematology and Oncology Follow Up Visit  Rachael Johnson [id number] [birth year] 66 y.o. 07/16/2017   Principle Diagnosis:  Essential thrombocythemia - JAK2 negative. 2. Intermittent iron-deficiency anemia.  Current Therapy:   Hydrea 1000/1000/500 mg by mouth daily - on hold starting 05/14/2017 - DC'd on 05/28/2017 Anagrelide 1 mg by mouth twice daily-start on 07/16/2017 Aspirin 81 mg by mouth daily IV iron as indicated - last dose given on 04/03/2017      Interim History:  [name] is back for followup.  She is feeling a little bit better.  She still having problems with healing of this wound in the right shoulder.  She has been on oral antibiotics.  I will try her on some IV Cubicin.  Hopefully this will help heal this area up.  We increase her dose of anagrelide more last saw her.  Although this might be able to help with her platelet count.  Unfortunately, her platelet count is still quite high.  We will have to further increase the anagrelide dose.  She has had no headache.  There is been no pain in her hands or feet.  She has had no cough or shortness of breath.  She had no nausea or vomiting.  Overall, her performance status is now ECOG 1.  Medications:  Current Outpatient Medications:  .  albuterol (VENTOLIN HFA) 108 (90 Base) MCG/ACT inhaler, INHALE 2 PUFFS INTO THE LUNGS EVERY 6 (SIX) HOURS AS NEEDED FOR WHEEZING OR SHORTNESS OF BREATH.  Can use Ventolin, ProAir or Proventil whichever cheaper, Disp: 54 Inhaler, Rfl: 3 .  ALPRAZolam (XANAX) 1 MG tablet, TAKE 1 TABLET BY MOUTH THREE TIMES A DAY AS NEEDED FOR ANXIETY, Disp: 180 tablet, Rfl: 1 .  amoxicillin-clavulanate (AUGMENTIN) 875-125 MG tablet, Take 1 tablet by mouth 2 (two) times daily., Disp: 20 tablet, Rfl: 0 .  anagrelide (AGRYLIN) 0.5 MG capsule, Take 1 capsule (0.5 mg total) by mouth 2 (two) times daily., Disp: 60 capsule, Rfl: 3 .  aspirin EC 81 MG tablet, Take 81 mg by mouth every morning., Disp: , Rfl:  .   budesonide-formoterol (SYMBICORT) 80-4.5 MCG/ACT inhaler, Inhale 2 puffs into the lungs 2 (two) times daily., Disp: 1 Inhaler, Rfl: 0 .  busPIRone (BUSPAR) 10 MG tablet, Take 1 tablet (10 mg total) by mouth 3 (three) times daily., Disp: 90 tablet, Rfl: 0 .  cetirizine (ZYRTEC) 10 MG tablet, Take 1 tablet (10 mg total) by mouth daily., Disp: 90 tablet, Rfl: 1 .  chlorpheniramine (CHLOR-TRIMETON) 4 MG tablet, Take 8 mg by mouth at bedtime., Disp: , Rfl:  .  collagenase (SANTYL) ointment, Apply topically daily., Disp: 15 g, Rfl: 0 .  Cranberry 300 MG tablet, Take 300 mg by mouth 2 (two) times daily., Disp: , Rfl:  .  cyclobenzaprine (FLEXERIL) 10 MG tablet, TAKE 1 TABLET (10 MG TOTAL) BY MOUTH 2 (TWO) TIMES DAILY AS NEEDED FOR MUSCLE SPASMS., Disp: 60 tablet, Rfl: 3 .  estradiol (ESTRACE) 0.5 MG tablet, Take 1 tablet (0.5 mg total) by mouth 2 (two) times daily., Disp: 180 tablet, Rfl: 1 .  fluticasone (FLONASE) 50 MCG/ACT nasal spray, Place 2 sprays into both nostrils daily., Disp: 48 g, Rfl: 3 .  furosemide (LASIX) 20 MG tablet, Take 1 tablet (20 mg total) by mouth daily as needed., Disp: 30 tablet, Rfl: 3 .  HYDROcodone-acetaminophen (NORCO) 5-325 MG tablet, Take 1 tablet by mouth every 6 (six) hours as needed for severe pain. Take one tablet by mouth every 6  hours as needed for severe pain, Disp: 120 tablet, Rfl: 0 .  lactulose (CHRONULAC) 10 GM/15ML solution, TAKE 2 TABLESPOONFULS (30 ML) BY MOUTH 3 TIMES A DAY FOR CONSTIPATION, Disp: 1000 mL, Rfl: 8 .  levothyroxine (SYNTHROID, LEVOTHROID) 88 MCG tablet, TAKE 1 TABLET DAILY, Disp: 30 tablet, Rfl: 2 .  meclizine (ANTIVERT) 12.5 MG tablet, TAKE 1 TABLET THREE TIMES A DAY AS NEEDED FOR NAUSEA OR DIZZINESS, Disp: 30 tablet, Rfl: 1 .  montelukast (SINGULAIR) 10 MG tablet, Take 1 tablet (10 mg total) by mouth at bedtime., Disp: 90 tablet, Rfl: 3 .  Multiple Vitamin (MULTIVITAMIN PO), Take 1 tablet by mouth every morning. , Disp: , Rfl:  .  pramipexole  (MIRAPEX) 0.25 MG tablet, TAKE ONE OR TWO TABLETS BY MOUTH AT BEDTIME AS NEEDED, Disp: , Rfl: 1 .  promethazine (PHENERGAN) 25 MG tablet, Take 1 tablet (25 mg total) by mouth every 6 (six) hours as needed for nausea or vomiting., Disp: 60 tablet, Rfl: 4 .  ranitidine (ZANTAC) 300 MG tablet, Take 1 tablet (300 mg total) by mouth at bedtime., Disp: 90 tablet, Rfl: 1 .  RESTASIS 0.05 % ophthalmic emulsion, PLACE ONE DROP INTO EACH EYE EVERY 12 HOURS, Disp: , Rfl: 3 .  Suvorexant (BELSOMRA) 15 MG TABS, Take 15 mg by mouth at bedtime as needed., Disp: 30 tablet, Rfl: 2 .  venlafaxine XR (EFFEXOR-XR) 150 MG 24 hr capsule, , Disp: , Rfl:  .  Vitamin D, Ergocalciferol, (DRISDOL) 50000 units CAPS capsule, Take 1 capsule (50,000 Units total) by mouth every 7 (seven) days., Disp: 12 capsule, Rfl: 1  Current Facility-Administered Medications:  .  DAPTOmycin (CUBICIN) 250 mg in sodium chloride 0.9 % IVPB, 250 mg, Intravenous, Q24H, Ennever, Rudell Cobb, MD, Stopped at 07/16/17 1403  Allergies: No Known Allergies  Past Medical History, Surgical history, Social history, and Family History were reviewed and updated.  Review of Systems: As stated in the interim history  Physical Exam:  weight is 137 lb (62.1 kg). Her oral temperature is 98.6 F (37 C). Her blood pressure is 97/70 and her pulse is 112 (abnormal). Her respiration is 18 and oxygen saturation is 99%.   Physical Exam  Constitutional: She is oriented to person, place, and time.  HENT:  Head: Normocephalic and atraumatic.  Mouth/Throat: Oropharynx is clear and moist.  Eyes: EOM are normal. Pupils are equal, round, and reactive to light.  Neck: Normal range of motion.  Cardiovascular: Normal rate, regular rhythm and normal heart sounds.  Pulmonary/Chest: Effort normal and breath sounds normal.  Abdominal: Soft. Bowel sounds are normal.  Musculoskeletal: Normal range of motion. She exhibits no edema, tenderness or deformity.  Lymphadenopathy:     She has no cervical adenopathy.  Neurological: She is alert and oriented to person, place, and time.  Skin: Skin is warm and dry. No rash noted. No erythema.  Psychiatric: She has a normal mood and affect. Her behavior is normal. Judgment and thought content normal.  Vitals reviewed.    Lab Results  Component Value Date   WBC 3.9 07/16/2017   HGB 11.7 07/16/2017   HCT 36.1 07/16/2017   MCV 118 (H) 07/16/2017   PLT 935 (H) 07/16/2017     Chemistry      Component Value Date/Time   NA 143 07/16/2017 1057   NA 140 11/29/2016 0936   K 3.2 (L) 07/16/2017 1057   K 4.2 11/29/2016 0936   CL 104 07/16/2017 1057   CO2 25 07/16/2017 1057  CO2 27 11/29/2016 0936   BUN 10 07/16/2017 1057   BUN 18.3 11/29/2016 0936   CREATININE 0.8 07/16/2017 1057   CREATININE 0.8 11/29/2016 0936      Component Value Date/Time   CALCIUM 8.9 07/16/2017 1057   CALCIUM 8.9 11/29/2016 0936   ALKPHOS 71 07/16/2017 1057   ALKPHOS 77 11/29/2016 0936   AST 24 07/16/2017 1057   AST 19 11/29/2016 0936   ALT 27 07/16/2017 1057   ALT 10 11/29/2016 0936   BILITOT 0.40 07/16/2017 1057   BILITOT <0.22 11/29/2016 0936         Impression and Plan: Rachael Johnson is 66 year-old white female with essential thrombocythemia.  We will go ahead and increase her anagrelide to 1 mg p.o. twice daily.  She is not iron deficient.  Her iron levels today showed a ferritin of 876 and iron saturation of 44%.  If her platelet count does not improve, we will have to seriously consider a bone marrow biopsy on her to see what is going on.  May be, treating this shoulder infection will help.  We will have to watch her closely.  I am glad that her hemoglobin is better.  Her white cell count certainly is not going up.  I will plan to see her back in another 6 weeks.  By then, we should hopefully see the increase dose of anagrelide work.  If not, then I will have to push to get a bone marrow test done.  Counts.     [name], MD 12/17/20186:07 PM

## 2017-07-16 NOTE — Patient Instructions (Signed)
Daptomycin injection What is this medicine? DAPTOMYCIN (DAP toe MYE sin) is a lipopeptide antibiotic. It is used to treat certain kinds of bacterial infections. It will not work for colds, flu, or other viral infections. This medicine may be used for other purposes; ask your health care provider or pharmacist if you have questions. COMMON BRAND NAME(S): Cubicin, Cubicin RF What should I tell my health care provider before I take this medicine? They need to know if you have any of these conditions: -kidney disease -an unusual or allergic reaction to daptomycin, other medicines, foods, dyes, or preservatives -pregnant or trying to get pregnant -breast-feeding How should I use this medicine? This medicine is for infusion into a vein. It is usually given by a health care professional in a hospital or clinic setting. If you get this medicine at home, you will be taught how to prepare and give this medicine. Use exactly as directed. Take your medicine at regular intervals. Do not take your medicine more often than directed. Take all of your medicine as directed even if you think you are better. Do not skip doses or stop your medicine early. It is important that you put your used needles and syringes in a special sharps container. Do not put them in a trash can. If you do not have a sharps container, call your pharmacist or healthcare provider to get one. Talk to your pediatrician regarding the use of this medicine in children. While this drug may be prescribed for children as young as 1 year for selected conditions, precautions do apply. Overdosage: If you think you have taken too much of this medicine contact a poison control center or emergency room at once. NOTE: This medicine is only for you. Do not share this medicine with others. What if I miss a dose? If you miss a dose, take it as soon as you can. If it is almost time for your next dose, take only that dose. Do not take double or extra  doses. What may interact with this medicine? -birth control pills -some antibiotics like tobramycin This list may not describe all possible interactions. Give your health care provider a list of all the medicines, herbs, non-prescription drugs, or dietary supplements you use. Also tell them if you smoke, drink alcohol, or use illegal drugs. Some items may interact with your medicine. What should I watch for while using this medicine? Your condition will be monitored carefully while you are receiving this medicine. Do not treat diarrhea with over the counter products. Contact your doctor if you have diarrhea that lasts more than 2 days or if it is severe and watery. What side effects may I notice from receiving this medicine? Side effects that you should report to your doctor or health care professional as soon as possible: -allergic reactions like skin rash, itching or hives, swelling of the face, lips, or tongue -breathing problems -fever, infection -high or low blood pressure -muscle pain -numb or tingling pain -trouble passing urine or change in the amount of urine -unusually tired or weak -vomiting Side effects that usually do not require medical attention (report to your doctor or health care professional if they continue or are bothersome): -constipation or diarrhea -trouble sleeping -headache -nausea -stomach upset This list may not describe all possible side effects. Call your doctor for medical advice about side effects. You may report side effects to FDA at 1-800-FDA-1088. Where should I keep my medicine? Keep out of the reach of children. If you are using   this medicine at home, you will be instructed on how to store this medicine. Throw away any unused medicine after the expiration date on the label. NOTE: This sheet is a summary. It may not cover all possible information. If you have questions about this medicine, talk to your doctor, pharmacist, or health care provider.   2018 [employer]/Gold Standard (2015-10-29 11:21:16)  

## 2017-07-17 ENCOUNTER — Other Ambulatory Visit: Payer: Self-pay

## 2017-07-17 ENCOUNTER — Ambulatory Visit (HOSPITAL_BASED_OUTPATIENT_CLINIC_OR_DEPARTMENT_OTHER): Payer: Medicare Other

## 2017-07-17 ENCOUNTER — Other Ambulatory Visit: Payer: Self-pay | Admitting: *Deleted

## 2017-07-17 DIAGNOSIS — L03119 Cellulitis of unspecified part of limb: Secondary | ICD-10-CM | POA: Diagnosis present

## 2017-07-17 MED ORDER — DAPTOMYCIN 500 MG IV SOLR
4.0000 mg/kg | INTRAVENOUS | Status: DC
Start: 2017-07-17 — End: 2017-07-17

## 2017-07-17 MED ORDER — SODIUM CHLORIDE 0.9 % IV SOLN
250.0000 mg | INTRAVENOUS | Status: DC
  Administered 2017-07-17: 250 mg via INTRAVENOUS
  Filled 2017-07-17: qty 5

## 2017-07-17 MED ORDER — DOXYCYCLINE HYCLATE 100 MG PO TABS: 100.0000 mg | ORAL_TABLET | Freq: Two times a day (BID) | ORAL | 0 refills | Status: DC

## 2017-07-17 MED ORDER — SODIUM CHLORIDE 0.9 % IV SOLN
INTRAVENOUS | Status: DC
  Administered 2017-07-17: 14:00:00 via INTRAVENOUS

## 2017-07-17 NOTE — Patient Instructions (Signed)
Daptomycin injection What is this medicine? DAPTOMYCIN (DAP toe MYE sin) is a lipopeptide antibiotic. It is used to treat certain kinds of bacterial infections. It will not work for colds, flu, or other viral infections. This medicine may be used for other purposes; ask your health care provider or pharmacist if you have questions. COMMON BRAND NAME(S): Cubicin, Cubicin RF What should I tell my health care provider before I take this medicine? They need to know if you have any of these conditions: -kidney disease -an unusual or allergic reaction to daptomycin, other medicines, foods, dyes, or preservatives -pregnant or trying to get pregnant -breast-feeding How should I use this medicine? This medicine is for infusion into a vein. It is usually given by a health care professional in a hospital or clinic setting. If you get this medicine at home, you will be taught how to prepare and give this medicine. Use exactly as directed. Take your medicine at regular intervals. Do not take your medicine more often than directed. Take all of your medicine as directed even if you think you are better. Do not skip doses or stop your medicine early. It is important that you put your used needles and syringes in a special sharps container. Do not put them in a trash can. If you do not have a sharps container, call your pharmacist or healthcare provider to get one. Talk to your pediatrician regarding the use of this medicine in children. While this drug may be prescribed for children as young as 1 year for selected conditions, precautions do apply. Overdosage: If you think you have taken too much of this medicine contact a poison control center or emergency room at once. NOTE: This medicine is only for you. Do not share this medicine with others. What if I miss a dose? If you miss a dose, take it as soon as you can. If it is almost time for your next dose, take only that dose. Do not take double or extra  doses. What may interact with this medicine? -birth control pills -some antibiotics like tobramycin This list may not describe all possible interactions. Give your health care provider a list of all the medicines, herbs, non-prescription drugs, or dietary supplements you use. Also tell them if you smoke, drink alcohol, or use illegal drugs. Some items may interact with your medicine. What should I watch for while using this medicine? Your condition will be monitored carefully while you are receiving this medicine. Do not treat diarrhea with over the counter products. Contact your doctor if you have diarrhea that lasts more than 2 days or if it is severe and watery. What side effects may I notice from receiving this medicine? Side effects that you should report to your doctor or health care professional as soon as possible: -allergic reactions like skin rash, itching or hives, swelling of the face, lips, or tongue -breathing problems -fever, infection -high or low blood pressure -muscle pain -numb or tingling pain -trouble passing urine or change in the amount of urine -unusually tired or weak -vomiting Side effects that usually do not require medical attention (report to your doctor or health care professional if they continue or are bothersome): -constipation or diarrhea -trouble sleeping -headache -nausea -stomach upset This list may not describe all possible side effects. Call your doctor for medical advice about side effects. You may report side effects to FDA at 1-800-FDA-1088. Where should I keep my medicine? Keep out of the reach of children. If you are using   this medicine at home, you will be instructed on how to store this medicine. Throw away any unused medicine after the expiration date on the label. NOTE: This sheet is a summary. It may not cover all possible information. If you have questions about this medicine, talk to your doctor, pharmacist, or health care provider.   2018 [employer]/Gold Standard (2015-10-29 11:21:16)  

## 2017-07-17 NOTE — Addendum Note (Signed)
Addended by: Burney Gauze R on: 07/17/2017 01:56 PM   Modules accepted: Orders

## 2017-07-23 ENCOUNTER — Other Ambulatory Visit: Payer: Self-pay | Admitting: *Deleted

## 2017-07-27 ENCOUNTER — Ambulatory Visit: Payer: Medicare Other | Admitting: Family Medicine

## 2017-08-10 ENCOUNTER — Encounter: Payer: Self-pay | Admitting: Family Medicine

## 2017-08-10 ENCOUNTER — Ambulatory Visit (INDEPENDENT_AMBULATORY_CARE_PROVIDER_SITE_OTHER): Payer: Medicare Other | Admitting: Family Medicine

## 2017-08-10 ENCOUNTER — Telehealth: Payer: Self-pay | Admitting: Emergency Medicine

## 2017-08-10 VITALS — BP 126/80 | HR 82 | Temp 97.6°F | Resp 16 | Ht 62.99 in | Wt 140.8 lb

## 2017-08-10 DIAGNOSIS — G8929 Other chronic pain: Secondary | ICD-10-CM | POA: Diagnosis not present

## 2017-08-10 DIAGNOSIS — D473 Essential (hemorrhagic) thrombocythemia: Secondary | ICD-10-CM

## 2017-08-10 DIAGNOSIS — S41001S Unspecified open wound of right shoulder, sequela: Secondary | ICD-10-CM

## 2017-08-10 DIAGNOSIS — E079 Disorder of thyroid, unspecified: Secondary | ICD-10-CM | POA: Diagnosis not present

## 2017-08-10 DIAGNOSIS — E559 Vitamin D deficiency, unspecified: Secondary | ICD-10-CM

## 2017-08-10 DIAGNOSIS — E031 Congenital hypothyroidism without goiter: Secondary | ICD-10-CM | POA: Diagnosis not present

## 2017-08-10 DIAGNOSIS — E785 Hyperlipidemia, unspecified: Secondary | ICD-10-CM | POA: Diagnosis not present

## 2017-08-10 DIAGNOSIS — M549 Dorsalgia, unspecified: Secondary | ICD-10-CM | POA: Diagnosis not present

## 2017-08-10 DIAGNOSIS — R739 Hyperglycemia, unspecified: Secondary | ICD-10-CM | POA: Diagnosis not present

## 2017-08-10 DIAGNOSIS — D539 Nutritional anemia, unspecified: Secondary | ICD-10-CM | POA: Diagnosis not present

## 2017-08-10 DIAGNOSIS — I10 Essential (primary) hypertension: Secondary | ICD-10-CM | POA: Diagnosis not present

## 2017-08-10 LAB — COMPREHENSIVE METABOLIC PANEL
ALT: 10 U/L (ref 0–35)
AST: 18 U/L (ref 0–37)
Albumin: 4.2 g/dL (ref 3.5–5.2)
Alkaline Phosphatase: 71 U/L (ref 39–117)
BUN: 13 mg/dL (ref 6–23)
CO2: 32 mEq/L (ref 19–32)
Calcium: 9.3 mg/dL (ref 8.4–10.5)
Chloride: 99 mEq/L (ref 96–112)
Creatinine, Ser: 0.72 mg/dL (ref 0.40–1.20)
GFR: 86.26 mL/min (ref >60.00)
Glucose, Bld: 85 mg/dL (ref 70–99)
Potassium: 4 mEq/L (ref 3.5–5.1)
Sodium: 136 mEq/L (ref 135–145)
Total Bilirubin: 0.2 mg/dL (ref 0.2–1.2)
Total Protein: 7.7 g/dL (ref 6.0–8.3)

## 2017-08-10 LAB — LIPID PANEL
Cholesterol: 250 mg/dL — ABNORMAL HIGH (ref 0–200)
HDL: 56.3 mg/dL (ref >39.00)
NonHDL: 193.39
Total CHOL/HDL Ratio: 4
Triglycerides: 313 mg/dL — ABNORMAL HIGH (ref 0.0–149.0)
VLDL: 62.6 mg/dL — ABNORMAL HIGH (ref 0.0–40.0)

## 2017-08-10 LAB — CBC
HCT: 40.9 % (ref 36.0–46.0)
Hemoglobin: 13.4 g/dL (ref 12.0–15.0)
MCHC: 32.7 g/dL (ref 30.0–36.0)
MCV: 111 fl — ABNORMAL HIGH (ref 78.0–100.0)
Platelets: 1018 10*3/uL — ABNORMAL HIGH (ref 150.0–400.0)
RBC: 3.68 Mil/uL — ABNORMAL LOW (ref 3.87–5.11)
RDW: 12.8 % (ref 11.5–15.5)
WBC: 5 10*3/uL (ref 4.0–10.5)

## 2017-08-10 LAB — TSH: TSH: 48.45 u[IU]/mL — ABNORMAL HIGH (ref 0.35–4.50)

## 2017-08-10 LAB — HEMOGLOBIN A1C: Hgb A1c MFr Bld: 5 % (ref 4.6–6.5)

## 2017-08-10 LAB — VITAMIN D 25 HYDROXY (VIT D DEFICIENCY, FRACTURES): VITD: 43.7 ng/mL (ref 30.00–100.00)

## 2017-08-10 LAB — LDL CHOLESTEROL, DIRECT: Direct LDL: 123 mg/dL

## 2017-08-10 LAB — MAGNESIUM: Magnesium: 2 mg/dL (ref 1.5–2.5)

## 2017-08-10 MED ORDER — HYDROCODONE-ACETAMINOPHEN 7.5-325 MG PO TABS: 1.0000 | ORAL_TABLET | Freq: Four times a day (QID) | ORAL | 0 refills | Status: DC | PRN

## 2017-08-10 NOTE — Assessment & Plan Note (Signed)
Improving recheck

## 2017-08-10 NOTE — Telephone Encounter (Signed)
"  CRITICAL VALUE STICKER  CRITICAL VALUE:Plt Count [phone]  RECEIVER (on-site recipient of call):Kristy P.  DATE & TIME NOTIFIED: 08-10-17 330  MESSENGER (representative from lab):Hope  MD NOTIFIED:   TIME OF NOTIFICATION:  RESPONSE:

## 2017-08-10 NOTE — Assessment & Plan Note (Signed)
Check level, continue supplements

## 2017-08-10 NOTE — Telephone Encounter (Signed)
Did labs with our mutual patient thought I would forward them for your review

## 2017-08-10 NOTE — Assessment & Plan Note (Signed)
Check level 

## 2017-08-10 NOTE — Assessment & Plan Note (Addendum)
On Levothyroxine, continue to monitor. TSH markedly elevated will increase Levothyroxine dosing.

## 2017-08-10 NOTE — Assessment & Plan Note (Signed)
minimize simple carbs. Increase exercise as tolerated.  

## 2017-08-10 NOTE — Progress Notes (Signed)
Subjective:  I acted as a Education administrator for [employer]. [name], [name]   Patient ID: Rachael Johnson, female    DOB: 1951/01/29, 67 y.o.   MRN: [id number]  Chief Complaint  Patient presents with  . Follow-up    HPI  Patient is in today for follow up visit and continues to struggle with wound on right shoulder. It is painful but improving. No fevers or chills but still has fatigue and myalgias. Continues to have daily back and neck pain. No recent injury or hospitalizations. She is on Doxycycline for the wound on her shoulder. She feels it is stable but not improving. She had been given some IV antibiotics for a couple of doses and she thought it was improving at that time. Denies CP/palp/SOB/HA/congestion/fevers/GI or GU c/o. Taking meds as prescribed  Patient Care Team: Mosie Lukes, MD as PCP - General (Family Medicine) Marin Olp Rudell Cobb, MD as Consulting Physician (Oncology) Glenna Fellows, MD as Attending Physician (Neurosurgery)   Past Medical History:  Diagnosis Date  . Acute renal insufficiency 04/09/2017  . Anemia, iron deficiency 07/08/2012  . Arthritis   . Cataract 12/21/2014  . Depression with anxiety 03/24/2011  . Diverticulitis   . Esophageal reflux 08/17/2013  . Essential thrombocythemia ([place]) 01/24/2011  . Frequent episodic tension-type headache   . Gastroenteritis 12/21/2014  . 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 GERD (gastroesophageal reflux disease)   . H. pylori infection 12/19/2012  . Hyperglycemia 12/17/2015  . Hypertension   . Iron deficiency anemia due to chronic blood loss 04/03/2017  . Osteoarthritis 05/24/2017  . Other and unspecified hyperlipidemia 02/25/2013  . Restless leg syndrome 09/30/2014  . Rhabdomyolysis 02/2017  . Scabies 03/28/2015  . Seizure ([place])    childhood  . Shoulder wound, right, sequela 03/14/2017  . Thyroid disease     Past Surgical History:  Procedure Laterality Date  . ABDOMINAL HYSTERECTOMY     1992  . BREAST SURGERY  1992   biopsy, benign. Fibrocystic   . UMBILICAL HERNIA REPAIR N/A 09/25/2012   Procedure: HERNIA REPAIR UMBILICAL ADULT;  Surgeon: Harl Bowie, MD;  Location: [facility];  Service: General;  Laterality: N/A;    Family History  Problem Relation Age of Onset  . Arthritis Mother   . Cancer Mother        ovarian  . Hypertension Mother   . Heart disease Mother        pacer  . Heart failure Mother   . Arthritis Father   . Cancer Father        lung  . Hypertension Father   . Cancer Sister        lung  . Cancer Brother        prostate  . Cancer Brother        lung  . Hypertension Son   . COPD Brother   . Heart disease Brother        died from CHF  . Hypertension Brother   . Cancer Brother        colon  . Alcohol abuse Brother   . Cirrhosis Brother   . Seizures Sister   . Stroke Sister   . Arthritis Brother     Social History   Socioeconomic History  . Marital status: Widowed    Spouse name: Not on file  . Number of children: 2  . Years of education: Not on file  . Highest education level: Not on file  Social Needs  . Emergency planning/management officer  strain: Not on file  . Food insecurity - worry: Not on file  . Food insecurity - inability: Not on file  . Transportation needs - medical: Not on file  . Transportation needs - non-medical: Not on file  Occupational History  . Occupation: APL IT sales professional: [employer]    Comment: [employer]  Tobacco Use  . Smoking status: Former Smoker    Packs/day: 3.50    Years: 20.00    Pack years: 70.00    Types: Cigarettes    Start date: 11/15/1970    Last attempt to quit: 07/31/1990    Years since quitting: 27.0  . Smokeless tobacco: Never Used  . Tobacco comment: quit 23 years ago  Substance and Sexual Activity  . Alcohol use: No    Alcohol/week: 0.0 oz  . Drug use: No  . Sexual activity: No  Other Topics Concern  . Not on file  Social History Narrative   Regular exercise: no   Caffeine Use: 2-3 weekly    Outpatient Medications Prior to Visit    Medication Sig Dispense Refill  . albuterol (VENTOLIN HFA) 108 (90 Base) MCG/ACT inhaler INHALE 2 PUFFS INTO THE LUNGS EVERY 6 (SIX) HOURS AS NEEDED FOR WHEEZING OR SHORTNESS OF BREATH.  Can use Ventolin, ProAir or Proventil whichever cheaper 54 Inhaler 3  . ALPRAZolam (XANAX) 1 MG tablet TAKE 1 TABLET BY MOUTH THREE TIMES A DAY AS NEEDED FOR ANXIETY 180 tablet 1  . anagrelide (AGRYLIN) 1 MG capsule Take 1 capsule (1 mg total) by mouth 2 (two) times daily. 60 capsule 3  . aspirin EC 81 MG tablet Take 81 mg by mouth every morning.    . budesonide-formoterol (SYMBICORT) 80-4.5 MCG/ACT inhaler Inhale 2 puffs into the lungs 2 (two) times daily. 1 Inhaler 0  . busPIRone (BUSPAR) 10 MG tablet Take 1 tablet (10 mg total) by mouth 3 (three) times daily. 90 tablet 0  . cetirizine (ZYRTEC) 10 MG tablet Take 1 tablet (10 mg total) by mouth daily. 90 tablet 1  . chlorpheniramine (CHLOR-TRIMETON) 4 MG tablet Take 8 mg by mouth at bedtime.    . collagenase (SANTYL) ointment Apply topically daily. 15 g 0  . Cranberry 300 MG tablet Take 300 mg by mouth 2 (two) times daily.    . cyclobenzaprine (FLEXERIL) 10 MG tablet TAKE 1 TABLET (10 MG TOTAL) BY MOUTH 2 (TWO) TIMES DAILY AS NEEDED FOR MUSCLE SPASMS. 60 tablet 3  . doxycycline (VIBRA-TABS) 100 MG tablet Take 1 tablet (100 mg total) by mouth 2 (two) times daily. For 2 weeks. 28 tablet 0  . estradiol (ESTRACE) 0.5 MG tablet Take 1 tablet (0.5 mg total) by mouth 2 (two) times daily. 180 tablet 1  . fluticasone (FLONASE) 50 MCG/ACT nasal spray Place 2 sprays into both nostrils daily. 48 g 3  . furosemide (LASIX) 20 MG tablet Take 1 tablet (20 mg total) by mouth daily as needed. 30 tablet 3  . HYDROcodone-acetaminophen (NORCO) 5-325 MG tablet Take 1 tablet by mouth every 6 (six) hours as needed for severe pain. Take one tablet by mouth every 6 hours as needed for severe pain 120 tablet 0  . lactulose (CHRONULAC) 10 GM/15ML solution TAKE 2 TABLESPOONFULS (30 ML) BY  MOUTH 3 TIMES A DAY FOR CONSTIPATION 1000 mL 8  . levothyroxine (SYNTHROID, LEVOTHROID) 88 MCG tablet TAKE 1 TABLET DAILY 30 tablet 2  . meclizine (ANTIVERT) 12.5 MG tablet TAKE 1 TABLET THREE TIMES A DAY AS NEEDED  FOR NAUSEA OR DIZZINESS 30 tablet 1  . montelukast (SINGULAIR) 10 MG tablet Take 1 tablet (10 mg total) by mouth at bedtime. 90 tablet 3  . Multiple Vitamin (MULTIVITAMIN PO) Take 1 tablet by mouth every morning.     . pramipexole (MIRAPEX) 0.25 MG tablet TAKE ONE OR TWO TABLETS BY MOUTH AT BEDTIME AS NEEDED  1  . promethazine (PHENERGAN) 25 MG tablet Take 1 tablet (25 mg total) by mouth every 6 (six) hours as needed for nausea or vomiting. 60 tablet 4  . ranitidine (ZANTAC) 300 MG tablet Take 1 tablet (300 mg total) by mouth at bedtime. 90 tablet 1  . RESTASIS 0.05 % ophthalmic emulsion PLACE ONE DROP INTO EACH EYE EVERY 12 HOURS  3  . Suvorexant (BELSOMRA) 15 MG TABS Take 15 mg by mouth at bedtime as needed. 30 tablet 2  . venlafaxine XR (EFFEXOR-XR) 150 MG 24 hr capsule     . Vitamin D, Ergocalciferol, (DRISDOL) 50000 units CAPS capsule Take 1 capsule (50,000 Units total) by mouth every 7 (seven) days. 12 capsule 1   No facility-administered medications prior to visit.     No Known Allergies  Review of Systems  Constitutional: Positive for malaise/fatigue. Negative for fever.  HENT: Negative for congestion.   Eyes: Negative for blurred vision.  Respiratory: Negative for shortness of breath.   Cardiovascular: Negative for chest pain, palpitations and leg swelling.  Gastrointestinal: Positive for nausea and vomiting. Negative for abdominal pain, blood in stool, constipation and diarrhea.  Genitourinary: Negative for dysuria and frequency.  Musculoskeletal: Negative for falls.  Skin: Positive for rash.  Neurological: Negative for dizziness, loss of consciousness and headaches.  Endo/Heme/Allergies: Negative for environmental allergies.  Psychiatric/Behavioral: Negative for  depression. The patient is not nervous/anxious.        Objective:    Physical Exam  Constitutional: She is oriented to person, place, and time. She appears well-developed and well-nourished. No distress.  HENT:  Head: Normocephalic and atraumatic.  Nose: Nose normal.  Eyes: Right eye exhibits no discharge. Left eye exhibits no discharge.  Neck: Normal range of motion. Neck supple.  Cardiovascular: Normal rate and regular rhythm.  No murmur heard. Pulmonary/Chest: Effort normal and breath sounds normal.  Abdominal: Soft. Bowel sounds are normal. There is no tenderness.  Musculoskeletal: She exhibits no edema.  Neurological: She is alert and oriented to person, place, and time.  Skin: Skin is warm and dry.  2x4 cm raised erythematous lesion on posterior right shoulder. Scab in center.   Psychiatric: She has a normal mood and affect.  Nursing note and vitals reviewed.   BP 126/80 (BP Location: Left Arm, Patient Position: Sitting, Cuff Size: Normal)   Pulse 82   Temp 97.6 F (36.4 C) (Oral)   Resp 16   Ht 5' 2.99" (1.6 m)   Wt 140 lb 12.8 oz (63.9 kg)   LMP 07/31/1990   SpO2 96%   BMI 24.95 kg/m  Wt Readings from Last 3 Encounters:  08/10/17 140 lb 12.8 oz (63.9 kg)  07/16/17 137 lb (62.1 kg)  06/25/17 139 lb (63 kg)   BP Readings from Last 3 Encounters:  08/10/17 126/80  07/17/17 133/71  07/16/17 97/70     Immunization History  Administered Date(s) Administered  . Influenza Whole 05/16/2013  . Influenza, High Dose Seasonal PF 04/30/2017  . Influenza,inj,Quad PF,6+ Mos 04/02/2015, 04/18/2016  . Influenza-Unspecified 05/31/2014  . Pneumococcal Conjugate-13 04/04/2013  . Pneumococcal Polysaccharide-23 04/18/2016  . Pneumococcal-Unspecified 04/10/2011  .  Td 08/01/2007  . Tdap 07/06/2014  . Zoster 04/28/2013    Health Maintenance  Topic Date Due  . Hepatitis C Screening  1950/12/13  . PAP SMEAR  10/30/2014  . MAMMOGRAM  11/03/2018  . COLONOSCOPY  11/28/2020    . PNA vac Low Risk Adult (2 of 2 - PPSV23) 04/18/2021  . TETANUS/TDAP  07/06/2024  . INFLUENZA VACCINE  Completed  . DEXA SCAN  Completed  . HIV Screening  Completed    Lab Results  Component Value Date   WBC 5.0 08/10/2017   HGB 13.4 08/10/2017   HCT 40.9 08/10/2017   PLT (H) 08/10/2017    1018.0 Critical result called to [facility] on 08/10/2017 3:09 PM by Scales, [place]. Results were read back to caller.   GLUCOSE 85 08/10/2017   CHOL 250 (H) 08/10/2017   TRIG 313.0 (H) 08/10/2017   HDL 56.30 08/10/2017   LDLDIRECT 123.0 08/10/2017   LDLCALC 82 02/12/2017   ALT 10 08/10/2017   AST 18 08/10/2017   NA 136 08/10/2017   K 4.0 08/10/2017   CL 99 08/10/2017   CREATININE 0.72 08/10/2017   BUN 13 08/10/2017   CO2 32 08/10/2017   TSH 48.45 (H) 08/10/2017   INR 1.23 03/13/2017   HGBA1C 5.0 08/10/2017    Lab Results  Component Value Date   TSH 48.45 (H) 08/10/2017   Lab Results  Component Value Date   WBC 5.0 08/10/2017   HGB 13.4 08/10/2017   HCT 40.9 08/10/2017   MCV 111.0 (H) 08/10/2017   PLT (H) 08/10/2017    1018.0 Critical result called to [place] on 08/10/2017 3:09 PM by Scales, [place]. Results were read back to caller.   Lab Results  Component Value Date   NA 136 08/10/2017   K 4.0 08/10/2017   CHLORIDE 105 11/29/2016   CO2 32 08/10/2017   GLUCOSE 85 08/10/2017   BUN 13 08/10/2017   CREATININE 0.72 08/10/2017   BILITOT 0.2 08/10/2017   ALKPHOS 71 08/10/2017   AST 18 08/10/2017   ALT 10 08/10/2017   PROT 7.7 08/10/2017   ALBUMIN 4.2 08/10/2017   CALCIUM 9.3 08/10/2017   ANIONGAP 12 03/23/2017   EGFR 80 (L) 11/29/2016   GFR 86.26 08/10/2017   Lab Results  Component Value Date   CHOL 250 (H) 08/10/2017   Lab Results  Component Value Date   HDL 56.30 08/10/2017   Lab Results  Component Value Date   LDLCALC 82 02/12/2017   Lab Results  Component Value Date   TRIG 313.0 (H) 08/10/2017   Lab Results  Component Value Date   CHOLHDL 4 08/10/2017    Lab Results  Component Value Date   HGBA1C 5.0 08/10/2017         Assessment & Plan:   Problem List Items Addressed This Visit    Hypothyroid    On Levothyroxine, continue to monitor. TSH markedly elevated will increase Levothyroxine dosing.       Macrocytic anemia    Improving recheck      Relevant Orders   CBC (Completed)   Hyperlipidemia    Encouraged heart healthy diet, increase exercise, avoid trans fats, consider a krill oil cap daily      Relevant Orders   Lipid panel (Completed)   Essential thrombocythemia ([place])    Is following closely with hemaatology      Hyperglycemia    minimize simple carbs. Increase exercise as tolerated.       Relevant Orders   Hemoglobin  A1c (Completed)   Comprehensive metabolic panel (Completed)   Vitamin D deficiency    Check level      Relevant Orders   VITAMIN D 25 Hydroxy (Vit-D Deficiency, Fractures) (Completed)   Essential hypertension    Well controlled, no changes to meds. Encouraged heart healthy diet such as the DASH diet and exercise as tolerated.       Shoulder wound, right, sequela    Is currently on oral antibiotics and lesion is stable but not resolve completely she is due to see hematology soon and when they previously administered IV antibiotics she felt she improved. After she follows with them if no response she will let us know so we can refer to dermatology for further consideration and consideration of biopsy      Hypomagnesemia    Check level, continue supplements      Relevant Orders   Magnesium (Completed)    Other Visit Diagnoses    Thyroid disease    -  Primary   Relevant Orders   TSH (Completed)   Chronic back pain, unspecified back location, unspecified back pain laterality       Relevant Medications   HYDROcodone-acetaminophen (NORCO) 7.5-325 MG tablet      I am having Madelaine D. Noreen start on HYDROcodone-acetaminophen. I am also having her maintain her Multiple Vitamin  (MULTIVITAMIN PO), aspirin EC, Cranberry, promethazine, albuterol, fluticasone, levothyroxine, meclizine, cetirizine, estradiol, ranitidine, lactulose, montelukast, Vitamin D (Ergocalciferol), chlorpheniramine, busPIRone, collagenase, budesonide-formoterol, furosemide, HYDROcodone-acetaminophen, ALPRAZolam, RESTASIS, pramipexole, cyclobenzaprine, venlafaxine XR, Suvorexant, anagrelide, and doxycycline.  Meds ordered this encounter  Medications  . HYDROcodone-acetaminophen (NORCO) 7.5-325 MG tablet    Sig: Take 1 tablet by mouth every 6 (six) hours as needed for moderate pain.    Dispense:  120 tablet    Refill:  0    CMA served as scribe during this visit. History, Physical and Plan performed by medical provider. Documentation and orders reviewed and attested to.  Penni Homans, MD

## 2017-08-10 NOTE — Assessment & Plan Note (Signed)
Encouraged heart healthy diet, increase exercise, avoid trans fats, consider a krill oil cap daily 

## 2017-08-10 NOTE — Patient Instructions (Signed)
Food Choices to Help Relieve Diarrhea, Adult  When you have diarrhea, the foods you eat and your eating habits are very important. Choosing the right foods and drinks can help:  · Relieve diarrhea.  · Replace lost fluids and nutrients.  · Prevent dehydration.    What general guidelines should I follow?  Relieving diarrhea  · Choose foods with less than 2 g or .07 oz. of fiber per serving.  · Limit fats to less than 8 tsp (38 g or 1.34 oz.) a day.  · Avoid the following:  ? Foods and beverages sweetened with high-fructose corn syrup, honey, or sugar alcohols such as xylitol, sorbitol, and mannitol.  ? Foods that contain a lot of fat or sugar.  ? Fried, greasy, or spicy foods.  ? High-fiber grains, breads, and cereals.  ? Raw fruits and vegetables.  · Eat foods that are rich in probiotics. These foods include dairy products such as yogurt and fermented milk products. They help increase healthy bacteria in the stomach and intestines (gastrointestinal tract, or GI tract).  · If you have lactose intolerance, avoid dairy products. These may make your diarrhea worse.  · Take medicine to help stop diarrhea (antidiarrheal medicine) only as told by your health care provider.  Replacing nutrients  · Eat small meals or snacks every 3–4 hours.  · Eat bland foods, such as white rice, toast, or baked potato, until your diarrhea starts to get better. Gradually reintroduce nutrient-rich foods as tolerated or as told by your health care provider. This includes:  ? Well-cooked protein foods.  ? Peeled, seeded, and soft-cooked fruits and vegetables.  ? Low-fat dairy products.  · Take vitamin and mineral supplements as told by your health care provider.  Preventing dehydration    · Start by sipping water or a special solution to prevent dehydration (oral rehydration solution, ORS). Urine that is clear or pale yellow means that you are getting enough fluid.  · Try to drink at least 8–10 cups of fluid each day to help replace lost  fluids.  · You may add other liquids in addition to water, such as clear juice or decaffeinated sports drinks, as tolerated or as told by your health care provider.  · Avoid drinks with caffeine, such as coffee, tea, or soft drinks.  · Avoid alcohol.  What foods are recommended?  The items listed may not be a complete list. Talk with your health care provider about what dietary choices are best for you.  Grains  White rice. White, French, or pita breads (fresh or toasted), including plain rolls, buns, or bagels. White pasta. Saltine, soda, or graham crackers. Pretzels. Low-fiber cereal. Cooked cereals made with water (such as cornmeal, farina, or cream cereals). Plain muffins. Matzo. Melba toast. Zwieback.  Vegetables  Potatoes (without the skin). Most well-cooked and canned vegetables without skins or seeds. Tender lettuce.  Fruits  Apple sauce. Fruits canned in juice. Cooked apricots, cherries, grapefruit, peaches, pears, or plums. Fresh bananas and cantaloupe.  Meats and other protein foods  Baked or boiled chicken. Eggs. Tofu. Fish. Seafood. Smooth nut butters. Ground or well-cooked tender beef, ham, veal, lamb, pork, or poultry.  Dairy  Plain yogurt, kefir, and unsweetened liquid yogurt. Lactose-free milk, buttermilk, skim milk, or soy milk. Low-fat or nonfat hard cheese.  Beverages  Water. Low-calorie sports drinks. Fruit juices without pulp. Strained tomato and vegetable juices. Decaffeinated teas. Sugar-free beverages not sweetened with sugar alcohols. Oral rehydration solutions, if approved by your health care   provider.  Seasoning and other foods  Bouillon, broth, or soups made from recommended foods.  What foods are not recommended?  The items listed may not be a complete list. Talk with your health care provider about what dietary choices are best for you.  Grains  Whole grain, whole wheat, bran, or rye breads, rolls, pastas, and crackers. Wild or brown rice. Whole grain or bran cereals. Barley. Oats  and oatmeal. Corn tortillas or taco shells. Granola. Popcorn.  Vegetables  Raw vegetables. Fried vegetables. Cabbage, broccoli, Brussels sprouts, artichokes, baked beans, beet greens, corn, kale, legumes, peas, sweet potatoes, and yams. Potato skins. Cooked spinach and cabbage.  Fruits  Dried fruit, including raisins and dates. Raw fruits. Stewed or dried prunes. Canned fruits with syrup.  Meat and other protein foods  Fried or fatty meats. Deli meats. Chunky nut butters. Nuts and seeds. Beans and lentils. Bacon. Hot dogs. Sausage.  Dairy  High-fat cheeses. Whole milk, chocolate milk, and beverages made with milk, such as milk shakes. Half-and-half. Cream. sour cream. Ice cream.  Beverages  Caffeinated beverages (such as coffee, tea, soda, or energy drinks). Alcoholic beverages. Fruit juices with pulp. Prune juice. Soft drinks sweetened with high-fructose corn syrup or sugar alcohols. High-calorie sports drinks.  Fats and oils  Butter. Cream sauces. Margarine. Salad oils. Plain salad dressings. Olives. Avocados. Mayonnaise.  Sweets and desserts  Sweet rolls, doughnuts, and sweet breads. Sugar-free desserts sweetened with sugar alcohols such as xylitol and sorbitol.  Seasoning and other foods  Honey. Hot sauce. Chili powder. Gravy. Cream-based or milk-based soups. Pancakes and waffles.  Summary  · When you have diarrhea, the foods you eat and your eating habits are very important.  · Make sure you get at least 8–10 cups of fluid each day, or enough to keep your urine clear or pale yellow.  · Eat bland foods and gradually reintroduce healthy, nutrient-rich foods as tolerated, or as told by your health care provider.  · Avoid high-fiber, fried, greasy, or spicy foods.  This information is not intended to replace advice given to you by your health care provider. Make sure you discuss any questions you have with your health care provider.  Document Released: 10/07/2003 Document Revised: 07/14/2016 Document  Reviewed: 07/14/2016  Elsevier Interactive Patient Education © 2018 [employer]

## 2017-08-12 NOTE — Assessment & Plan Note (Signed)
Well controlled, no changes to meds. Encouraged heart healthy diet such as the DASH diet and exercise as tolerated.  °

## 2017-08-12 NOTE — Assessment & Plan Note (Signed)
Is following closely with hemaatology

## 2017-08-12 NOTE — Assessment & Plan Note (Signed)
Is currently on oral antibiotics and lesion is stable but not resolve completely she is due to see hematology soon and when they previously administered IV antibiotics she felt she improved. After she follows with them if no response she will let us know so we can refer to dermatology for further consideration and consideration of biopsy

## 2017-08-16 ENCOUNTER — Telehealth: Payer: Self-pay | Admitting: Family Medicine

## 2017-08-16 NOTE — Telephone Encounter (Signed)
So her thyroid is actually underactive. When TSH is high it means her thyroid supplement is too low. This can make you tired, constipated, have dry skin, when bad enough can lead to heart trouble so it is important for her to increase the Levothyroxine dose as directed

## 2017-08-16 NOTE — Telephone Encounter (Signed)
Patient called in for lab results, results given per note Dr. Charlett Blake 08/10/17, patient verbalized understanding, she says she is taking her synthroid 88 mcg daily and just picked up a refill. She said the bottle she picked up is 88 mcg. She asked what are symptoms of having a high thyroid and would like a call back with what it can do to her body, her organs, etc. Unable to chart in result note due to result note not routed to [facility].

## 2017-08-16 NOTE — Telephone Encounter (Signed)
Please advise 

## 2017-08-16 NOTE — Telephone Encounter (Signed)
Copied from [place] [id number]. Topic: Quick Communication - Rx Refill/Question >> Aug 16, 2017  1:39 PM Ahmed Prima L wrote: Medication: levothyroxine (SYNTHROID, LEVOTHROID) 88 MCG tablet  Said the dr wanted her to start taking 141mcg. She wants to know if that is still correct & if so, is that what she is going to call in or will it be the 52mcg. Please advise    Has the patient contacted their pharmacy? yes   (Agent: If no, request that the patient contact the pharmacy for the refill.)   Preferred Pharmacy (with phone number or street name[facility] [place]   Agent: Please be advised that RX refills may take up to 3 business days. We ask that you follow-up with your pharmacy.

## 2017-08-17 ENCOUNTER — Other Ambulatory Visit: Payer: Self-pay | Admitting: *Deleted

## 2017-08-17 ENCOUNTER — Inpatient Hospital Stay: Payer: Medicare Other | Attending: Hematology & Oncology

## 2017-08-17 DIAGNOSIS — D473 Essential (hemorrhagic) thrombocythemia: Secondary | ICD-10-CM

## 2017-08-17 DIAGNOSIS — Z79899 Other long term (current) drug therapy: Secondary | ICD-10-CM | POA: Insufficient documentation

## 2017-08-17 DIAGNOSIS — D509 Iron deficiency anemia, unspecified: Secondary | ICD-10-CM | POA: Insufficient documentation

## 2017-08-17 DIAGNOSIS — Z7982 Long term (current) use of aspirin: Secondary | ICD-10-CM | POA: Diagnosis not present

## 2017-08-17 LAB — CBC WITH DIFFERENTIAL (CANCER CENTER ONLY)
Basophils Absolute: 0.1 10*3/uL (ref 0.0–0.1)
Basophils Relative: 1 %
Eosinophils Absolute: 0.2 10*3/uL (ref 0.0–0.5)
Eosinophils Relative: 4 %
HCT: 38.2 % (ref 34.8–46.6)
Hemoglobin: 12.2 g/dL (ref 11.6–15.9)
Lymphocytes Relative: 29 %
Lymphs Abs: 1.4 10*3/uL (ref 0.9–3.3)
MCH: 36.2 pg — ABNORMAL HIGH (ref 26.0–34.0)
MCHC: 31.9 g/dL — ABNORMAL LOW (ref 32.0–36.0)
MCV: 113.4 fL — ABNORMAL HIGH (ref 81.0–101.0)
Monocytes Absolute: 0.3 10*3/uL (ref 0.1–0.9)
Monocytes Relative: 7 %
Neutro Abs: 2.9 10*3/uL (ref 1.5–6.5)
Neutrophils Relative %: 59 %
Platelet Count: 886 10*3/uL — ABNORMAL HIGH (ref 145–400)
RBC: 3.37 MIL/uL — ABNORMAL LOW (ref 3.70–5.32)
RDW: 11.9 % (ref 11.1–15.7)
WBC Count: 5 10*3/uL (ref 3.9–10.3)

## 2017-08-17 MED ORDER — LEVOTHYROXINE SODIUM 112 MCG PO TABS: 112.0000 ug | ORAL_TABLET | Freq: Every day | ORAL | 3 refills | Status: DC

## 2017-08-17 NOTE — Telephone Encounter (Signed)
rx was sent in

## 2017-08-17 NOTE — Telephone Encounter (Signed)
Pt walked in to the office to ask if Rx had been sent yet. States she got her letter about lab results and dose increase.  [facility]s [facility] [place]. Pt notified Rx is being sent now.  Notes recorded by Mosie Lukes, MD on 08/10/2017 at 10:04 PM EST Notify labs stable except her TSH is very high confirm she is taking her Levothyroxine. If she is not have her start back on the 88 mcg tab daily if she is have her increase to 112 mcg tab, 1 tab po daily disp #30 with 3 rf

## 2017-08-20 NOTE — Telephone Encounter (Signed)
Thanks Tricia  

## 2017-08-27 ENCOUNTER — Inpatient Hospital Stay: Payer: Medicare Other

## 2017-08-27 ENCOUNTER — Inpatient Hospital Stay: Payer: Medicare Other | Admitting: Family

## 2017-08-28 ENCOUNTER — Inpatient Hospital Stay (HOSPITAL_BASED_OUTPATIENT_CLINIC_OR_DEPARTMENT_OTHER): Payer: Medicare Other | Admitting: Hematology & Oncology

## 2017-08-28 ENCOUNTER — Other Ambulatory Visit: Payer: Self-pay

## 2017-08-28 ENCOUNTER — Inpatient Hospital Stay: Payer: Medicare Other

## 2017-08-28 VITALS — BP 133/82 | HR 84 | Temp 97.8°F | Resp 20 | Wt 142.2 lb

## 2017-08-28 DIAGNOSIS — Z79899 Other long term (current) drug therapy: Secondary | ICD-10-CM | POA: Diagnosis not present

## 2017-08-28 DIAGNOSIS — D473 Essential (hemorrhagic) thrombocythemia: Secondary | ICD-10-CM | POA: Diagnosis not present

## 2017-08-28 DIAGNOSIS — Z7982 Long term (current) use of aspirin: Secondary | ICD-10-CM | POA: Diagnosis not present

## 2017-08-28 DIAGNOSIS — D500 Iron deficiency anemia secondary to blood loss (chronic): Secondary | ICD-10-CM

## 2017-08-28 DIAGNOSIS — D509 Iron deficiency anemia, unspecified: Secondary | ICD-10-CM | POA: Diagnosis not present

## 2017-08-28 LAB — CMP (CANCER CENTER ONLY)
ALT: 8 U/L (ref 0–55)
AST: 21 U/L (ref 5–34)
Albumin: 4 g/dL (ref 3.5–5.0)
Alkaline Phosphatase: 84 U/L (ref 40–150)
Anion gap: 7 (ref 3–11)
BUN: 10 mg/dL (ref 7–26)
CO2: 30 mmol/L — ABNORMAL HIGH (ref 22–29)
Calcium: 8.8 mg/dL (ref 8.4–10.4)
Chloride: 102 mmol/L (ref 98–109)
Creatinine: 0.83 mg/dL (ref 0.60–1.10)
GFR, Est AFR Am: >60 mL/min (ref >60)
GFR, Estimated: >60 mL/min (ref >60)
Glucose, Bld: 81 mg/dL (ref 70–140)
Potassium: 3.5 mmol/L (ref 3.3–4.7)
Sodium: 139 mmol/L (ref 136–145)
Total Bilirubin: <0.2 mg/dL — ABNORMAL LOW (ref 0.2–1.2)
Total Protein: 7.6 g/dL (ref 6.4–8.3)

## 2017-08-28 LAB — CBC WITH DIFFERENTIAL (CANCER CENTER ONLY)
Basophils Absolute: 0 10*3/uL (ref 0.0–0.1)
Basophils Relative: 1 %
Eosinophils Absolute: 0.2 10*3/uL (ref 0.0–0.5)
Eosinophils Relative: 4 %
HCT: 38 % (ref 34.8–46.6)
Hemoglobin: 12.4 g/dL (ref 11.6–15.9)
Lymphocytes Relative: 32 %
Lymphs Abs: 1.2 10*3/uL (ref 0.9–3.3)
MCH: 35.7 pg — ABNORMAL HIGH (ref 26.0–34.0)
MCHC: 32.6 g/dL (ref 32.0–36.0)
MCV: 109.5 fL — ABNORMAL HIGH (ref 81.0–101.0)
Monocytes Absolute: 0.4 10*3/uL (ref 0.1–0.9)
Monocytes Relative: 10 %
Neutro Abs: 2 10*3/uL (ref 1.5–6.5)
Neutrophils Relative %: 53 %
Platelet Count: 759 10*3/uL — ABNORMAL HIGH (ref 145–400)
RBC: 3.47 MIL/uL — ABNORMAL LOW (ref 3.70–5.32)
RDW: 12 % (ref 11.1–15.7)
WBC Count: 3.9 10*3/uL (ref 3.9–10.3)

## 2017-08-28 LAB — LACTATE DEHYDROGENASE: LDH: 204 U/L (ref 125–245)

## 2017-08-28 LAB — SAVE SMEAR

## 2017-08-28 NOTE — Progress Notes (Signed)
Hematology and Oncology Follow Up Visit  Rachael Johnson [id number] 1951/04/23 67 y.o. 08/28/2017   Principle Diagnosis:  Essential thrombocythemia - JAK2 negative. 2. Intermittent iron-deficiency anemia.  Current Therapy:   Hydrea 1000/1000/500 mg by mouth daily - on hold starting 05/14/2017 - DC'd on 05/28/2017 Anagrelide 1 mg by mouth twice daily-start on 07/16/2017 Aspirin 81 mg by mouth daily IV iron as indicated - last dose given on 04/03/2017      Interim History:  Ms.  Rachael Johnson is back for followup.  She actually looks pretty good.  She is still worried that the wound in the right shoulder is not healing.  She is doing a great job trying to keep this nice and clean and dry.  Her family is helping her out.  She has had no fever.  She had a very quiet Christmas and New Year's.  We adjusted her anagrelide dose upward the last time that we saw her.  This increased anagrelide dose is helping.  Her platelet count is slowly coming down.  We last saw her, her iron studies looked okay.  Her ferritin was 876 with an iron saturation of 44%.  I did go ahead and give her some supplies to help with the wound.  I told her that she actually may need to have a skin graft done for this wound.  I am not sure if she is seeing a wound clinic yet.  Her appetite is doing a little bit better.  She has some swelling in the legs.  There is no bleeding.  She has had no diarrhea or constipation.  There is no dysuria.  Overall, her performance status is now ECOG 1.  Medications:  Current Outpatient Medications:  .  albuterol (VENTOLIN HFA) 108 (90 Base) MCG/ACT inhaler, INHALE 2 PUFFS INTO THE LUNGS EVERY 6 (SIX) HOURS AS NEEDED FOR WHEEZING OR SHORTNESS OF BREATH.  Can use Ventolin, ProAir or Proventil whichever cheaper, Disp: 54 Inhaler, Rfl: 3 .  ALPRAZolam (XANAX) 1 MG tablet, TAKE 1 TABLET BY MOUTH THREE TIMES A DAY AS NEEDED FOR ANXIETY, Disp: 180 tablet, Rfl: 1 .  anagrelide (AGRYLIN) 1 MG  capsule, Take 1 capsule (1 mg total) by mouth 2 (two) times daily., Disp: 60 capsule, Rfl: 3 .  aspirin EC 81 MG tablet, Take 81 mg by mouth every morning., Disp: , Rfl:  .  budesonide-formoterol (SYMBICORT) 80-4.5 MCG/ACT inhaler, Inhale 2 puffs into the lungs 2 (two) times daily., Disp: 1 Inhaler, Rfl: 0 .  busPIRone (BUSPAR) 10 MG tablet, Take 1 tablet (10 mg total) by mouth 3 (three) times daily., Disp: 90 tablet, Rfl: 0 .  cetirizine (ZYRTEC) 10 MG tablet, Take 1 tablet (10 mg total) by mouth daily., Disp: 90 tablet, Rfl: 1 .  chlorpheniramine (CHLOR-TRIMETON) 4 MG tablet, Take 8 mg by mouth at bedtime., Disp: , Rfl:  .  collagenase (SANTYL) ointment, Apply topically daily., Disp: 15 g, Rfl: 0 .  Cranberry 300 MG tablet, Take 300 mg by mouth 2 (two) times daily., Disp: , Rfl:  .  cyclobenzaprine (FLEXERIL) 10 MG tablet, TAKE 1 TABLET (10 MG TOTAL) BY MOUTH 2 (TWO) TIMES DAILY AS NEEDED FOR MUSCLE SPASMS., Disp: 60 tablet, Rfl: 3 .  estradiol (ESTRACE) 0.5 MG tablet, Take 1 tablet (0.5 mg total) by mouth 2 (two) times daily., Disp: 180 tablet, Rfl: 1 .  fluticasone (FLONASE) 50 MCG/ACT nasal spray, Place 2 sprays into both nostrils daily., Disp: 48 g, Rfl: 3 .  furosemide (LASIX)  20 MG tablet, Take 1 tablet (20 mg total) by mouth daily as needed., Disp: 30 tablet, Rfl: 3 .  HYDROcodone-acetaminophen (NORCO) 7.5-325 MG tablet, Take 1 tablet by mouth every 6 (six) hours as needed for moderate pain., Disp: 120 tablet, Rfl: 0 .  lactulose (CHRONULAC) 10 GM/15ML solution, TAKE 2 TABLESPOONFULS (30 ML) BY MOUTH 3 TIMES A DAY FOR CONSTIPATION, Disp: 1000 mL, Rfl: 8 .  levothyroxine (SYNTHROID, LEVOTHROID) 112 MCG tablet, Take 1 tablet (112 mcg total) by mouth daily., Disp: 30 tablet, Rfl: 3 .  meclizine (ANTIVERT) 12.5 MG tablet, TAKE 1 TABLET THREE TIMES A DAY AS NEEDED FOR NAUSEA OR DIZZINESS, Disp: 30 tablet, Rfl: 1 .  montelukast (SINGULAIR) 10 MG tablet, Take 1 tablet (10 mg total) by mouth at  bedtime., Disp: 90 tablet, Rfl: 3 .  Multiple Vitamin (MULTIVITAMIN PO), Take 1 tablet by mouth every morning. , Disp: , Rfl:  .  pramipexole (MIRAPEX) 0.25 MG tablet, TAKE ONE OR TWO TABLETS BY MOUTH AT BEDTIME AS NEEDED, Disp: , Rfl: 1 .  promethazine (PHENERGAN) 25 MG tablet, Take 1 tablet (25 mg total) by mouth every 6 (six) hours as needed for nausea or vomiting., Disp: 60 tablet, Rfl: 4 .  ranitidine (ZANTAC) 300 MG tablet, Take 1 tablet (300 mg total) by mouth at bedtime., Disp: 90 tablet, Rfl: 1 .  RESTASIS 0.05 % ophthalmic emulsion, PLACE ONE DROP INTO EACH EYE EVERY 12 HOURS, Disp: , Rfl: 3 .  Suvorexant (BELSOMRA) 15 MG TABS, Take 15 mg by mouth at bedtime as needed., Disp: 30 tablet, Rfl: 2 .  venlafaxine XR (EFFEXOR-XR) 150 MG 24 hr capsule, , Disp: , Rfl:  .  Vitamin D, Ergocalciferol, (DRISDOL) 50000 units CAPS capsule, Take 1 capsule (50,000 Units total) by mouth every 7 (seven) days., Disp: 12 capsule, Rfl: 1  Allergies: No Known Allergies  Past Medical History, Surgical history, Social history, and Family History were reviewed and updated.  Review of Systems: Review of Systems  Constitutional: Negative.   HENT: Negative.   Eyes: Negative.   Respiratory: Negative.   Cardiovascular: Negative.   Gastrointestinal: Negative.   Genitourinary: Negative.   Musculoskeletal: Positive for joint pain.  Skin:       Nonhealing wound in the upper posterior right shoulder.  Currently, this is dressed.  I saw a picture that she took when she was cleaning the wound.  Neurological: Negative.   Endo/Heme/Allergies: Negative.   Psychiatric/Behavioral: Negative.     Physical Exam:  weight is 142 lb 4 oz (64.5 kg). Her oral temperature is 97.8 F (36.6 C). Her blood pressure is 133/82 and her pulse is 84. Her respiration is 20 and oxygen saturation is 99%.   Physical Exam  Constitutional: She is oriented to person, place, and time.  HENT:  Head: Normocephalic and atraumatic.   Mouth/Throat: Oropharynx is clear and moist.  Eyes: EOM are normal. Pupils are equal, round, and reactive to light.  Neck: Normal range of motion.  Cardiovascular: Normal rate, regular rhythm and normal heart sounds.  Pulmonary/Chest: Effort normal and breath sounds normal.  Abdominal: Soft. Bowel sounds are normal.  Musculoskeletal: Normal range of motion. She exhibits no edema, tenderness or deformity.  Lymphadenopathy:    She has no cervical adenopathy.  Neurological: She is alert and oriented to person, place, and time.  Skin: Skin is warm and dry. No rash noted. No erythema.  Psychiatric: She has a normal mood and affect. Her behavior is normal. Judgment and thought  content normal.  Vitals reviewed.    Lab Results  Component Value Date   WBC 3.9 08/28/2017   HGB 13.4 08/10/2017   HCT 38.0 08/28/2017   MCV 109.5 (H) 08/28/2017   PLT 759 (H) 08/28/2017     Chemistry      Component Value Date/Time   NA 136 08/10/2017 1110   NA 143 07/16/2017 1057   NA 140 11/29/2016 0936   K 4.0 08/10/2017 1110   K 3.2 (L) 07/16/2017 1057   K 4.2 11/29/2016 0936   CL 99 08/10/2017 1110   CL 104 07/16/2017 1057   CO2 32 08/10/2017 1110   CO2 25 07/16/2017 1057   CO2 27 11/29/2016 0936   BUN 13 08/10/2017 1110   BUN 10 07/16/2017 1057   BUN 18.3 11/29/2016 0936   CREATININE 0.72 08/10/2017 1110   CREATININE 0.8 07/16/2017 1057   CREATININE 0.8 11/29/2016 0936      Component Value Date/Time   CALCIUM 9.3 08/10/2017 1110   CALCIUM 8.9 07/16/2017 1057   CALCIUM 8.9 11/29/2016 0936   ALKPHOS 71 08/10/2017 1110   ALKPHOS 71 07/16/2017 1057   ALKPHOS 77 11/29/2016 0936   AST 18 08/10/2017 1110   AST 24 07/16/2017 1057   AST 19 11/29/2016 0936   ALT 10 08/10/2017 1110   ALT 27 07/16/2017 1057   ALT 10 11/29/2016 0936   BILITOT 0.2 08/10/2017 1110   BILITOT 0.40 07/16/2017 1057   BILITOT <0.22 11/29/2016 0936         Impression and Plan: Ms. Meske is 67 year-old white  female with essential thrombocythemia.  Her platelet count is slowly trending downward.  Hopefully, this will continue to improve as she stays on the anagrelide.  I told her that the wound in the right shoulder just might take a while to heal up because of her medications.  She is off Hydrea.  I would not think that anagrelide will cause a lot of wound healing delay.  We will have her come back to see [place] in another 3-4 weeks.  We still need to stay on top of her blood counts and make adjustments with her medications.  We will see what her iron studies are.  I spent 30 minutes with her.  Greater than 50% of this time was face-to-face talking with her and answering her questions.  [name], MD 1/29/20193:29 PM

## 2017-08-29 LAB — IRON AND TIBC
Iron: 111 ug/dL (ref 41–142)
Saturation Ratios: 52 % (ref 21–57)
TIBC: 211 ug/dL — ABNORMAL LOW (ref 236–444)
UIBC: 101 ug/dL

## 2017-08-29 LAB — FERRITIN: Ferritin: 686 ng/mL — ABNORMAL HIGH (ref 9–269)

## 2017-09-04 ENCOUNTER — Ambulatory Visit: Payer: Medicare Other | Admitting: Hematology & Oncology

## 2017-09-04 ENCOUNTER — Other Ambulatory Visit: Payer: Medicare Other

## 2017-09-07 ENCOUNTER — Encounter: Payer: Self-pay | Admitting: Family Medicine

## 2017-09-07 ENCOUNTER — Ambulatory Visit (INDEPENDENT_AMBULATORY_CARE_PROVIDER_SITE_OTHER): Payer: Medicare Other | Admitting: Family Medicine

## 2017-09-07 VITALS — BP 120/76 | HR 118 | Temp 97.6°F | Resp 18 | Wt 142.2 lb

## 2017-09-07 DIAGNOSIS — R739 Hyperglycemia, unspecified: Secondary | ICD-10-CM | POA: Diagnosis not present

## 2017-09-07 DIAGNOSIS — D473 Essential (hemorrhagic) thrombocythemia: Secondary | ICD-10-CM

## 2017-09-07 DIAGNOSIS — Z79899 Other long term (current) drug therapy: Secondary | ICD-10-CM

## 2017-09-07 DIAGNOSIS — M549 Dorsalgia, unspecified: Secondary | ICD-10-CM

## 2017-09-07 DIAGNOSIS — G8929 Other chronic pain: Secondary | ICD-10-CM | POA: Diagnosis not present

## 2017-09-07 DIAGNOSIS — D500 Iron deficiency anemia secondary to blood loss (chronic): Secondary | ICD-10-CM

## 2017-09-07 DIAGNOSIS — S41001S Unspecified open wound of right shoulder, sequela: Secondary | ICD-10-CM

## 2017-09-07 DIAGNOSIS — D539 Nutritional anemia, unspecified: Secondary | ICD-10-CM | POA: Diagnosis not present

## 2017-09-07 DIAGNOSIS — E785 Hyperlipidemia, unspecified: Secondary | ICD-10-CM | POA: Diagnosis not present

## 2017-09-07 DIAGNOSIS — R634 Abnormal weight loss: Secondary | ICD-10-CM | POA: Diagnosis not present

## 2017-09-07 DIAGNOSIS — I10 Essential (primary) hypertension: Secondary | ICD-10-CM

## 2017-09-07 DIAGNOSIS — M159 Polyosteoarthritis, unspecified: Secondary | ICD-10-CM | POA: Diagnosis not present

## 2017-09-07 MED ORDER — HYDROCODONE-ACETAMINOPHEN 7.5-325 MG PO TABS: 1.0000 | ORAL_TABLET | Freq: Four times a day (QID) | ORAL | 0 refills | Status: DC | PRN

## 2017-09-07 NOTE — Assessment & Plan Note (Signed)
Elevated recently. Has had her medicines changed and it is now improving.

## 2017-09-07 NOTE — Assessment & Plan Note (Signed)
Well controlled, no changes to meds. Encouraged heart healthy diet such as the DASH diet and exercise as tolerated.  °

## 2017-09-07 NOTE — Assessment & Plan Note (Addendum)
Improving no recent antibiotics feeling better and filling in with granulation tissue.

## 2017-09-07 NOTE — Assessment & Plan Note (Signed)
hgba1c acceptable, minimize simple carbs. Increase exercise as tolerated.  

## 2017-09-07 NOTE — Assessment & Plan Note (Signed)
Follows with hematology

## 2017-09-07 NOTE — Assessment & Plan Note (Signed)
Has been eating better and weight is coming up as she eats better.

## 2017-09-07 NOTE — Assessment & Plan Note (Signed)
Encouraged heart healthy diet, increase exercise, avoid trans fats, consider a krill oil cap daily 

## 2017-09-07 NOTE — Progress Notes (Signed)
 Subjective:  I acted as a scribe for Dr. Blyth. [name], RMA  Patient ID: Rachael Johnson, female    DOB: 10/30/1950, 66 y.o.   MRN: [id number]  No chief complaint on file.   HPI  Patient is in today for a 4 week follow up and she notes overall she is improving.  Her right shoulder wound is less painful and has been closing up.  No fevers and chills.  She has not needed any recent antibiotics.  No new concerns.  She does have chronic trouble with back pain but is well treated with her current medications.  She denies any concerning side effects.  No recent hospitalizations.  She continues to follow closely with hematolo for her anemia and thrombocythemia.  They had to alter meds recently for her elevated platelets and she is improving. Denies CP/palp/SOB/HA/congestion/fevers/GI or GU c/o. Taking meds as prescribed Patient Care Team: Blyth, Stacey A, MD as PCP - General (Family Medicine) Ennever, Peter R, MD as Consulting Physician (Oncology) Roy, Mark, MD as Attending Physician (Neurosurgery)   Past Medical History:  Diagnosis Date  . Acute renal insufficiency 04/09/2017  . Anemia, iron deficiency 07/08/2012  . Arthritis   . Cataract 12/21/2014  . Depression with anxiety 03/24/2011  . Diverticulitis   . Esophageal reflux 08/17/2013  . Essential thrombocythemia (HCC) 01/24/2011  . Frequent episodic tension-type headache   . Gastroenteritis 12/21/2014  . 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 GERD (gastroesophageal reflux disease)   . H. pylori infection 12/19/2012  . Hyperglycemia 12/17/2015  . Hypertension   . Iron deficiency anemia due to chronic blood loss 04/03/2017  . Osteoarthritis 05/24/2017  . Other and unspecified hyperlipidemia 02/25/2013  . Restless leg syndrome 09/30/2014  . Rhabdomyolysis 02/2017  . Scabies 03/28/2015  . Seizure (HCC)    childhood  . Shoulder wound, right, sequela 03/14/2017  . Thyroid disease     Past Surgical History:  Procedure Laterality Date  . ABDOMINAL  HYSTERECTOMY     1992  . BREAST SURGERY  1992   biopsy, benign. Fibrocystic  . UMBILICAL HERNIA REPAIR N/A 09/25/2012   Procedure: HERNIA REPAIR UMBILICAL ADULT;  Surgeon: Douglas A Blackman, MD;  Location: [facility];  Service: General;  Laterality: N/A;    Family History  Problem Relation Age of Onset  . Arthritis Mother   . Cancer Mother        ovarian  . Hypertension Mother   . Heart disease Mother        pacer  . Heart failure Mother   . Arthritis Father   . Cancer Father        lung  . Hypertension Father   . Cancer Sister        lung  . Cancer Brother        prostate  . Cancer Brother        lung  . Hypertension Son   . COPD Brother   . Heart disease Brother        died from CHF  . Hypertension Brother   . Cancer Brother        colon  . Alcohol abuse Brother   . Cirrhosis Brother   . Seizures Sister   . Stroke Sister   . Arthritis Brother     Social History   Socioeconomic History  . Marital status: Widowed    Spouse name: Not on file  . Number of children: 2  . Years of education: Not on file  .   Highest education level: Not on file  Social Needs  . Financial resource strain: Not on file  . Food insecurity - worry: Not on file  . Food insecurity - inability: Not on file  . Transportation needs - medical: Not on file  . Transportation needs - non-medical: Not on file  Occupational History  . Occupation: APL operator    Employer: [employer]    Comment: [employer]  Tobacco Use  . Smoking status: Former Smoker    Packs/day: 3.50    Years: 20.00    Pack years: 70.00    Types: Cigarettes    Start date: 11/15/1970    Last attempt to quit: 07/31/1990    Years since quitting: 27.1  . Smokeless tobacco: Never Used  . Tobacco comment: quit 23 years ago  Substance and Sexual Activity  . Alcohol use: No    Alcohol/week: 0.0 oz  . Drug use: No  . Sexual activity: No  Other Topics Concern  . Not on file  Social History Narrative   Regular exercise: no    Caffeine Use: 2-3 weekly    Outpatient Medications Prior to Visit  Medication Sig Dispense Refill  . albuterol (VENTOLIN HFA) 108 (90 Base) MCG/ACT inhaler INHALE 2 PUFFS INTO THE LUNGS EVERY 6 (SIX) HOURS AS NEEDED FOR WHEEZING OR SHORTNESS OF BREATH.  Can use Ventolin, ProAir or Proventil whichever cheaper 54 Inhaler 3  . ALPRAZolam (XANAX) 1 MG tablet TAKE 1 TABLET BY MOUTH THREE TIMES A DAY AS NEEDED FOR ANXIETY 180 tablet 1  . anagrelide (AGRYLIN) 1 MG capsule Take 1 capsule (1 mg total) by mouth 2 (two) times daily. 60 capsule 3  . aspirin EC 81 MG tablet Take 81 mg by mouth every morning.    . budesonide-formoterol (SYMBICORT) 80-4.5 MCG/ACT inhaler Inhale 2 puffs into the lungs 2 (two) times daily. 1 Inhaler 0  . busPIRone (BUSPAR) 10 MG tablet Take 1 tablet (10 mg total) by mouth 3 (three) times daily. 90 tablet 0  . cetirizine (ZYRTEC) 10 MG tablet Take 1 tablet (10 mg total) by mouth daily. 90 tablet 1  . chlorpheniramine (CHLOR-TRIMETON) 4 MG tablet Take 8 mg by mouth at bedtime.    . collagenase (SANTYL) ointment Apply topically daily. 15 g 0  . Cranberry 300 MG tablet Take 300 mg by mouth 2 (two) times daily.    . cyclobenzaprine (FLEXERIL) 10 MG tablet TAKE 1 TABLET (10 MG TOTAL) BY MOUTH 2 (TWO) TIMES DAILY AS NEEDED FOR MUSCLE SPASMS. 60 tablet 3  . estradiol (ESTRACE) 0.5 MG tablet Take 1 tablet (0.5 mg total) by mouth 2 (two) times daily. 180 tablet 1  . fluticasone (FLONASE) 50 MCG/ACT nasal spray Place 2 sprays into both nostrils daily. 48 g 3  . furosemide (LASIX) 20 MG tablet Take 1 tablet (20 mg total) by mouth daily as needed. 30 tablet 3  . lactulose (CHRONULAC) 10 GM/15ML solution TAKE 2 TABLESPOONFULS (30 ML) BY MOUTH 3 TIMES A DAY FOR CONSTIPATION 1000 mL 8  . levothyroxine (SYNTHROID, LEVOTHROID) 112 MCG tablet Take 1 tablet (112 mcg total) by mouth daily. 30 tablet 3  . meclizine (ANTIVERT) 12.5 MG tablet TAKE 1 TABLET THREE TIMES A DAY AS NEEDED FOR NAUSEA OR  DIZZINESS 30 tablet 1  . montelukast (SINGULAIR) 10 MG tablet Take 1 tablet (10 mg total) by mouth at bedtime. 90 tablet 3  . Multiple Vitamin (MULTIVITAMIN PO) Take 1 tablet by mouth every morning.     .   pramipexole (MIRAPEX) 0.25 MG tablet TAKE ONE OR TWO TABLETS BY MOUTH AT BEDTIME AS NEEDED  1  . promethazine (PHENERGAN) 25 MG tablet Take 1 tablet (25 mg total) by mouth every 6 (six) hours as needed for nausea or vomiting. 60 tablet 4  . ranitidine (ZANTAC) 300 MG tablet Take 1 tablet (300 mg total) by mouth at bedtime. 90 tablet 1  . RESTASIS 0.05 % ophthalmic emulsion PLACE ONE DROP INTO EACH EYE EVERY 12 HOURS  3  . Suvorexant (BELSOMRA) 15 MG TABS Take 15 mg by mouth at bedtime as needed. 30 tablet 2  . venlafaxine XR (EFFEXOR-XR) 150 MG 24 hr capsule     . Vitamin D, Ergocalciferol, (DRISDOL) 50000 units CAPS capsule Take 1 capsule (50,000 Units total) by mouth every 7 (seven) days. 12 capsule 1  . HYDROcodone-acetaminophen (NORCO) 7.5-325 MG tablet Take 1 tablet by mouth every 6 (six) hours as needed for moderate pain. 120 tablet 0   No facility-administered medications prior to visit.     No Known Allergies  Review of Systems  Constitutional: Positive for malaise/fatigue. Negative for fever.  HENT: Negative for congestion.   Eyes: Negative for blurred vision.  Respiratory: Negative for shortness of breath.   Cardiovascular: Negative for chest pain, palpitations and leg swelling.  Gastrointestinal: Negative for abdominal pain, blood in stool and nausea.  Genitourinary: Negative for dysuria and frequency.  Musculoskeletal: Positive for back pain and myalgias. Negative for falls.  Skin: Negative for rash.  Neurological: Negative for dizziness, loss of consciousness and headaches.  Endo/Heme/Allergies: Negative for environmental allergies.  Psychiatric/Behavioral: Negative for depression. The patient is nervous/anxious.        Objective:    Physical Exam  Constitutional:  She is oriented to person, place, and time. She appears well-developed and well-nourished. No distress.  HENT:  Head: Normocephalic and atraumatic.  Nose: Nose normal.  Eyes: Right eye exhibits no discharge. Left eye exhibits no discharge.  Neck: Normal range of motion. Neck supple.  Cardiovascular: Normal rate and regular rhythm.  No murmur heard. Pulmonary/Chest: Effort normal and breath sounds normal.  Abdominal: Soft. Bowel sounds are normal. There is no tenderness.  Musculoskeletal: She exhibits no edema.  Neurological: She is alert and oriented to person, place, and time.  Skin: Skin is warm and dry.  Right should 2 cm wound posteriorly, no surrounding erythema or fluctuance. Filled with granulation tissue.   Psychiatric: She has a normal mood and affect.  Nursing note and vitals reviewed.   BP 120/76 (BP Location: Left Arm, Patient Position: Sitting, Cuff Size: Normal)   Pulse (!) 118   Temp 97.6 F (36.4 C) (Oral)   Resp 18   Wt 142 lb 3.2 oz (64.5 kg)   LMP 07/31/1990   SpO2 97%   BMI 25.20 kg/m  Wt Readings from Last 3 Encounters:  09/07/17 142 lb 3.2 oz (64.5 kg)  08/28/17 142 lb 4 oz (64.5 kg)  08/10/17 140 lb 12.8 oz (63.9 kg)   BP Readings from Last 3 Encounters:  09/07/17 120/76  08/28/17 133/82  08/10/17 126/80     Immunization History  Administered Date(s) Administered  . Influenza Whole 05/16/2013  . Influenza, High Dose Seasonal PF 04/30/2017  . Influenza,inj,Quad PF,6+ Mos 04/02/2015, 04/18/2016  . Influenza-Unspecified 05/31/2014  . Pneumococcal Conjugate-13 04/04/2013  . Pneumococcal Polysaccharide-23 04/18/2016  . Pneumococcal-Unspecified 04/10/2011  . Td 08/01/2007  . Tdap 07/06/2014  . Zoster 04/28/2013    Health Maintenance  Topic Date Due  .   Hepatitis C Screening  28-May-1951  . PAP SMEAR  10/30/2014  . MAMMOGRAM  11/03/2018  . COLONOSCOPY  11/28/2020  . PNA vac Low Risk Adult (2 of 2 - PPSV23) 04/18/2021  . TETANUS/TDAP   07/06/2024  . INFLUENZA VACCINE  Completed  . DEXA SCAN  Completed  . HIV Screening  Completed    Lab Results  Component Value Date   WBC 3.9 08/28/2017   HGB 13.4 08/10/2017   HCT 38.0 08/28/2017   PLT 759 (H) 08/28/2017   GLUCOSE 81 08/28/2017   CHOL 250 (H) 08/10/2017   TRIG 313.0 (H) 08/10/2017   HDL 56.30 08/10/2017   LDLDIRECT 123.0 08/10/2017   LDLCALC 82 02/12/2017   ALT 8 08/28/2017   AST 21 08/28/2017   NA 139 08/28/2017   K 3.5 08/28/2017   CL 102 08/28/2017   CREATININE 0.83 08/28/2017   BUN 10 08/28/2017   CO2 30 (H) 08/28/2017   TSH 48.45 (H) 08/10/2017   INR 1.23 03/13/2017   HGBA1C 5.0 08/10/2017    Lab Results  Component Value Date   TSH 48.45 (H) 08/10/2017   Lab Results  Component Value Date   WBC 3.9 08/28/2017   HGB 13.4 08/10/2017   HCT 38.0 08/28/2017   MCV 109.5 (H) 08/28/2017   PLT 759 (H) 08/28/2017   Lab Results  Component Value Date   NA 139 08/28/2017   K 3.5 08/28/2017   CHLORIDE 105 11/29/2016   CO2 30 (H) 08/28/2017   GLUCOSE 81 08/28/2017   BUN 10 08/28/2017   CREATININE 0.83 08/28/2017   BILITOT <0.2 (L) 08/28/2017   ALKPHOS 84 08/28/2017   AST 21 08/28/2017   ALT 8 08/28/2017   PROT 7.6 08/28/2017   ALBUMIN 4.0 08/28/2017   CALCIUM 8.8 08/28/2017   ANIONGAP 7 08/28/2017   EGFR 80 (L) 11/29/2016   GFR 86.26 08/10/2017   Lab Results  Component Value Date   CHOL 250 (H) 08/10/2017   Lab Results  Component Value Date   HDL 56.30 08/10/2017   Lab Results  Component Value Date   LDLCALC 82 02/12/2017   Lab Results  Component Value Date   TRIG 313.0 (H) 08/10/2017   Lab Results  Component Value Date   CHOLHDL 4 08/10/2017   Lab Results  Component Value Date   HGBA1C 5.0 08/10/2017         Assessment & Plan:   Problem List Items Addressed This Visit    Generalized OA    Has been struggling with the weather changes she has been flared. Her pain meds mixed with plain tylenol has been helpful       Relevant Medications   HYDROcodone-acetaminophen (NORCO) 7.5-325 MG tablet   Weight loss    Has been eating better and weight is coming up as she eats better.       Macrocytic anemia    Following with hematology..       Hyperlipidemia    Encouraged heart healthy diet, increase exercise, avoid trans fats, consider a krill oil cap daily      Essential thrombocythemia ([place])    Elevated recently. Has had her medicines changed and it is now improving.       Iron deficiency anemia due to chronic blood loss (Chronic)    Follows with hematology      Hyperglycemia    hgba1c acceptable, minimize simple carbs. Increase exercise as tolerated.       Essential hypertension    Well controlled,  no changes to meds. Encouraged heart healthy diet such as the DASH diet and exercise as tolerated.       Shoulder wound, right, sequela    Improving no recent antibiotics feeling better and filling in with granulation tissue.       Chronic back pain    Encouraged moist heat and gentle stretching as tolerated. May try NSAIDs and prescription meds as directed and report if symptoms worsen or seek immediate care. Uds, contract up to date. Tolerable pain on current meds. Refills allowed      Relevant Medications   HYDROcodone-acetaminophen (NORCO) 7.5-325 MG tablet    Other Visit Diagnoses    High risk medication use    -  Primary   Relevant Orders   Pain Mgmt, Profile 8 w/Conf, U      I am having Lanai D. Szczerba maintain her Multiple Vitamin (MULTIVITAMIN PO), aspirin EC, Cranberry, promethazine, albuterol, fluticasone, meclizine, cetirizine, estradiol, ranitidine, lactulose, montelukast, Vitamin D (Ergocalciferol), chlorpheniramine, busPIRone, collagenase, budesonide-formoterol, furosemide, ALPRAZolam, RESTASIS, pramipexole, cyclobenzaprine, venlafaxine XR, Suvorexant, anagrelide, levothyroxine, and HYDROcodone-acetaminophen.  Meds ordered this encounter  Medications  .  HYDROcodone-acetaminophen (NORCO) 7.5-325 MG tablet    Sig: Take 1 tablet by mouth every 6 (six) hours as needed for moderate pain.    Dispense:  120 tablet    Refill:  0    CMA served as scribe during this visit. History, Physical and Plan performed by medical provider. Documentation and orders reviewed and attested to.  Penni Homans, MD

## 2017-09-07 NOTE — Assessment & Plan Note (Signed)
Has been struggling with the weather changes she has been flared. Her pain meds mixed with plain tylenol has been helpful

## 2017-09-07 NOTE — Patient Instructions (Signed)
Hypothyroidism Hypothyroidism is a disorder of the thyroid. The thyroid is a large gland that is located in the lower front of the neck. The thyroid releases hormones that control how the body works. With hypothyroidism, the thyroid does not make enough of these hormones. What are the causes? Causes of hypothyroidism may include:  Viral infections.  Pregnancy.  Your own defense system (immune system) attacking your thyroid.  Certain medicines.  Birth defects.  Past radiation treatments to your head or neck.  Past treatment with radioactive iodine.  Past surgical removal of part or all of your thyroid.  Problems with the gland that is located in the center of your brain (pituitary).  What are the signs or symptoms? Signs and symptoms of hypothyroidism may include:  Feeling as though you have no energy (lethargy).  Inability to tolerate cold.  Weight gain that is not explained by a change in diet or exercise habits.  Dry skin.  Coarse hair.  Menstrual irregularity.  Slowing of thought processes.  Constipation.  Sadness or depression.  How is this diagnosed? Your health care provider may diagnose hypothyroidism with blood tests and ultrasound tests. How is this treated? Hypothyroidism is treated with medicine that replaces the hormones that your body does not make. After you begin treatment, it may take several weeks for symptoms to go away. Follow these instructions at home:  Take medicines only as directed by your health care provider.  If you start taking any new medicines, tell your health care provider.  Keep all follow-up visits as directed by your health care provider. This is important. As your condition improves, your dosage needs may change. You will need to have blood tests regularly so that your health care provider can watch your condition. Contact a health care provider if:  Your symptoms do not get better with treatment.  You are taking thyroid  replacement medicine and: ? You sweat excessively. ? You have tremors. ? You feel anxious. ? You lose weight rapidly. ? You cannot tolerate heat. ? You have emotional swings. ? You have diarrhea. ? You feel weak. Get help right away if:  You develop chest pain.  You develop an irregular heartbeat.  You develop a rapid heartbeat. This information is not intended to replace advice given to you by your health care provider. Make sure you discuss any questions you have with your health care provider. Document Released: 07/17/2005 Document Revised: 12/23/2015 Document Reviewed: 12/02/2013 Elsevier Interactive Patient Education  2018 Elsevier Inc.  

## 2017-09-09 DIAGNOSIS — M549 Dorsalgia, unspecified: Secondary | ICD-10-CM

## 2017-09-09 DIAGNOSIS — G8929 Other chronic pain: Secondary | ICD-10-CM | POA: Insufficient documentation

## 2017-09-09 NOTE — Assessment & Plan Note (Signed)
Following with hematology.

## 2017-09-09 NOTE — Assessment & Plan Note (Signed)
Encouraged moist heat and gentle stretching as tolerated. May try NSAIDs and prescription meds as directed and report if symptoms worsen or seek immediate care. Uds, contract up to date. Tolerable pain on current meds. Refills allowed

## 2017-09-12 LAB — PAIN MGMT, PROFILE 8 W/CONF, U
6 Acetylmorphine: NEGATIVE ng/mL (ref <10)
Alcohol Metabolites: NEGATIVE ng/mL (ref <500)
Alphahydroxyalprazolam: 182 ng/mL — ABNORMAL HIGH (ref <25)
Alphahydroxymidazolam: NEGATIVE ng/mL (ref <50)
Alphahydroxytriazolam: NEGATIVE ng/mL (ref <50)
Aminoclonazepam: NEGATIVE ng/mL (ref <25)
Amphetamines: NEGATIVE ng/mL (ref <500)
Benzodiazepines: POSITIVE ng/mL — AB (ref <100)
Buprenorphine, Urine: NEGATIVE ng/mL (ref <5)
Cocaine Metabolite: NEGATIVE ng/mL (ref <150)
Codeine: NEGATIVE ng/mL (ref <50)
Creatinine: 24.8 mg/dL
Hydrocodone: 60 ng/mL — ABNORMAL HIGH (ref <50)
Hydromorphone: 81 ng/mL — ABNORMAL HIGH (ref <50)
Hydroxyethylflurazepam: NEGATIVE ng/mL (ref <50)
Lorazepam: NEGATIVE ng/mL (ref <50)
MDMA: NEGATIVE ng/mL (ref <500)
Marijuana Metabolite: NEGATIVE ng/mL (ref <20)
Morphine: NEGATIVE ng/mL (ref <50)
Nordiazepam: NEGATIVE ng/mL (ref <50)
Norhydrocodone: 271 ng/mL — ABNORMAL HIGH (ref <50)
Opiates: POSITIVE ng/mL — AB (ref <100)
Oxazepam: NEGATIVE ng/mL (ref <50)
Oxidant: NEGATIVE ug/mL (ref <200)
Oxycodone: NEGATIVE ng/mL (ref <100)
Temazepam: NEGATIVE ng/mL (ref <50)
pH: 6.64 (ref 4.5–9.0)

## 2017-09-13 ENCOUNTER — Telehealth: Payer: Self-pay

## 2017-09-13 NOTE — Telephone Encounter (Signed)
PA initiated via Covermymeds; KEY: [id number]. Awaiting determination.

## 2017-09-13 NOTE — Telephone Encounter (Signed)
PA approved. Effective 07/29/2017 through 07/30/2018.  

## 2017-09-14 ENCOUNTER — Other Ambulatory Visit: Payer: Self-pay | Admitting: Family Medicine

## 2017-09-19 ENCOUNTER — Other Ambulatory Visit: Payer: Self-pay | Admitting: Family Medicine

## 2017-09-23 ENCOUNTER — Other Ambulatory Visit: Payer: Self-pay | Admitting: Family Medicine

## 2017-09-24 ENCOUNTER — Telehealth: Payer: Self-pay | Admitting: Family Medicine

## 2017-09-24 NOTE — Telephone Encounter (Signed)
Copied from [place] [id number]. Topic: Quick Communication - See Telephone Encounter >> Sep 24, 2017  2:18 PM Ether Griffins B wrote: CRM for notification. See Telephone encounter for:  Pt received a letter from [employer] stating she needs a PA on her estradiol. Please contact them at [phone]. Pt requesting a call once completed. Ok to lvm  09/24/17.

## 2017-09-24 NOTE — Telephone Encounter (Signed)
PA initiated via Covermymeds; KEY: [id number]. Awaiting determination.

## 2017-09-24 NOTE — Telephone Encounter (Signed)
PA approved. Effective 07/29/2017 through 07/29/2018.

## 2017-09-25 ENCOUNTER — Inpatient Hospital Stay: Payer: Medicare Other

## 2017-09-25 ENCOUNTER — Other Ambulatory Visit: Payer: Self-pay

## 2017-09-25 ENCOUNTER — Encounter: Payer: Self-pay | Admitting: Hematology & Oncology

## 2017-09-25 ENCOUNTER — Inpatient Hospital Stay: Payer: Medicare Other | Attending: Hematology & Oncology | Admitting: Hematology & Oncology

## 2017-09-25 VITALS — BP 148/82 | HR 94 | Temp 98.2°F | Resp 19 | Wt 145.0 lb

## 2017-09-25 DIAGNOSIS — D509 Iron deficiency anemia, unspecified: Secondary | ICD-10-CM | POA: Diagnosis not present

## 2017-09-25 DIAGNOSIS — Z7982 Long term (current) use of aspirin: Secondary | ICD-10-CM | POA: Diagnosis not present

## 2017-09-25 DIAGNOSIS — Z79899 Other long term (current) drug therapy: Secondary | ICD-10-CM

## 2017-09-25 DIAGNOSIS — D500 Iron deficiency anemia secondary to blood loss (chronic): Secondary | ICD-10-CM

## 2017-09-25 DIAGNOSIS — D473 Essential (hemorrhagic) thrombocythemia: Secondary | ICD-10-CM | POA: Diagnosis not present

## 2017-09-25 LAB — CBC WITH DIFFERENTIAL (CANCER CENTER ONLY)
Basophils Absolute: 0.1 10*3/uL (ref 0.0–0.1)
Basophils Relative: 1 %
Eosinophils Absolute: 0.2 10*3/uL (ref 0.0–0.5)
Eosinophils Relative: 4 %
HCT: 36.6 % (ref 34.8–46.6)
Hemoglobin: 11.8 g/dL (ref 11.6–15.9)
Lymphocytes Relative: 22 %
Lymphs Abs: 1.1 10*3/uL (ref 0.9–3.3)
MCH: 34.7 pg — ABNORMAL HIGH (ref 26.0–34.0)
MCHC: 32.2 g/dL (ref 32.0–36.0)
MCV: 107.6 fL — ABNORMAL HIGH (ref 81.0–101.0)
Monocytes Absolute: 0.3 10*3/uL (ref 0.1–0.9)
Monocytes Relative: 6 %
Neutro Abs: 3.5 10*3/uL (ref 1.5–6.5)
Neutrophils Relative %: 67 %
Platelet Count: 889 10*3/uL — ABNORMAL HIGH (ref 145–400)
RBC: 3.4 MIL/uL — ABNORMAL LOW (ref 3.70–5.32)
RDW: 12.1 % (ref 11.1–15.7)
WBC Count: 5.2 10*3/uL (ref 3.9–10.0)

## 2017-09-25 LAB — CMP (CANCER CENTER ONLY)
ALT: 10 U/L (ref 0–55)
AST: 20 U/L (ref 5–34)
Albumin: 3.4 g/dL — ABNORMAL LOW (ref 3.5–5.0)
Alkaline Phosphatase: 100 U/L (ref 40–150)
Anion gap: 9 (ref 3–11)
BUN: 11 mg/dL (ref 7–26)
CO2: 28 mmol/L (ref 22–29)
Calcium: 9.3 mg/dL (ref 8.4–10.4)
Chloride: 103 mmol/L (ref 98–109)
Creatinine: 0.76 mg/dL (ref 0.60–1.10)
GFR, Est AFR Am: >60 mL/min (ref >60)
GFR, Estimated: >60 mL/min (ref >60)
Glucose, Bld: 86 mg/dL (ref 70–140)
Potassium: 4 mmol/L (ref 3.5–5.1)
Sodium: 140 mmol/L (ref 136–145)
Total Bilirubin: <0.2 mg/dL — ABNORMAL LOW (ref 0.2–1.2)
Total Protein: 7.4 g/dL (ref 6.4–8.3)

## 2017-09-25 LAB — TECHNOLOGIST SMEAR REVIEW

## 2017-09-25 LAB — IRON AND TIBC
Iron: 100 ug/dL (ref 41–142)
Saturation Ratios: 48 % (ref 21–57)
TIBC: 208 ug/dL — ABNORMAL LOW (ref 236–444)
UIBC: 107 ug/dL

## 2017-09-25 LAB — FERRITIN: Ferritin: 654 ng/mL — ABNORMAL HIGH (ref 9–269)

## 2017-09-25 NOTE — Progress Notes (Signed)
Hematology and Oncology Follow Up Visit  Rachael Johnson [id number] [birth year] 67 y.o. 09/25/2017   Principle Diagnosis:  Essential thrombocythemia - JAK2 negative. 2. Intermittent iron-deficiency anemia.  Current Therapy:   Hydrea 1000/1000/500 mg by mouth daily - on hold starting 05/14/2017 - DC'd on 05/28/2017 Anagrelide 1 mg by mouth twice daily-start on 07/16/2017 Aspirin 81 mg by mouth daily IV iron as indicated - last dose given on 04/03/2017      Interim History:  [name] is back for followup.  She is not feeling all that well.  She apparently had the flu.  She then got pneumonia.  She did not see a doctor for this.  She treated this herself with a little bit of "white liquor."  She has had no bleeding.  The wound on her right shoulder is slowly healing up.  She has had no nausea or vomiting.  She is doing well on the anagrelide.  She is had no leg swelling.  She had a fever of 102.5 while she had this pneumonia.  Again she took her "white liquor" and this broke the fever.  She has had no diarrhea.  Her appetite is doing marginal.  Again, she is had no nausea or vomiting.  Overall, her performance status is now ECOG 1-2.  Medications:  Current Outpatient Medications:  .  albuterol (VENTOLIN HFA) 108 (90 Base) MCG/ACT inhaler, INHALE 2 PUFFS INTO THE LUNGS EVERY 6 (SIX) HOURS AS NEEDED FOR WHEEZING OR SHORTNESS OF BREATH.  Can use Ventolin, ProAir or Proventil whichever cheaper, Disp: 54 Inhaler, Rfl: 3 .  ALPRAZolam (XANAX) 1 MG tablet, TAKE 1 TABLET BY MOUTH THREE TIMES A DAY AS NEEDED FOR ANXIETY, Disp: 180 tablet, Rfl: 1 .  anagrelide (AGRYLIN) 1 MG capsule, Take 1 capsule (1 mg total) by mouth 2 (two) times daily., Disp: 60 capsule, Rfl: 3 .  aspirin EC 81 MG tablet, Take 81 mg by mouth every morning., Disp: , Rfl:  .  budesonide-formoterol (SYMBICORT) 80-4.5 MCG/ACT inhaler, Inhale 2 puffs into the lungs 2 (two) times daily., Disp: 1 Inhaler, Rfl: 0 .  busPIRone  (BUSPAR) 10 MG tablet, Take 1 tablet (10 mg total) by mouth 3 (three) times daily., Disp: 90 tablet, Rfl: 0 .  cetirizine (ZYRTEC) 10 MG tablet, Take 1 tablet (10 mg total) by mouth daily., Disp: 90 tablet, Rfl: 1 .  chlorpheniramine (CHLOR-TRIMETON) 4 MG tablet, Take 8 mg by mouth at bedtime., Disp: , Rfl:  .  collagenase (SANTYL) ointment, Apply topically daily., Disp: 15 g, Rfl: 0 .  Cranberry 300 MG tablet, Take 300 mg by mouth 2 (two) times daily., Disp: , Rfl:  .  cyclobenzaprine (FLEXERIL) 10 MG tablet, TAKE 1 TABLET (10 MG TOTAL) BY MOUTH 2 (TWO) TIMES DAILY AS NEEDED FOR MUSCLE SPASMS., Disp: 60 tablet, Rfl: 3 .  estradiol (ESTRACE) 0.5 MG tablet, TAKE 1 TABLET (0.5 MG TOTAL) BY MOUTH 2 (TWO) TIMES DAILY., Disp: 180 tablet, Rfl: 1 .  fluticasone (FLONASE) 50 MCG/ACT nasal spray, Place 2 sprays into both nostrils daily., Disp: 48 g, Rfl: 3 .  furosemide (LASIX) 20 MG tablet, Take 1 tablet (20 mg total) by mouth daily as needed., Disp: 30 tablet, Rfl: 3 .  HYDROcodone-acetaminophen (NORCO) 7.5-325 MG tablet, Take 1 tablet by mouth every 6 (six) hours as needed for moderate pain., Disp: 120 tablet, Rfl: 0 .  lactulose (CHRONULAC) 10 GM/15ML solution, TAKE 2 TABLESPOONFULS (30 ML) BY MOUTH 3 TIMES A DAY FOR CONSTIPATION, Disp:  1000 mL, Rfl: 8 .  levothyroxine (SYNTHROID, LEVOTHROID) 112 MCG tablet, Take 1 tablet (112 mcg total) by mouth daily., Disp: 30 tablet, Rfl: 3 .  meclizine (ANTIVERT) 12.5 MG tablet, TAKE 1 TABLET THREE TIMES A DAY AS NEEDED FOR NAUSEA OR DIZZINESS, Disp: 30 tablet, Rfl: 1 .  montelukast (SINGULAIR) 10 MG tablet, Take 1 tablet (10 mg total) by mouth at bedtime., Disp: 90 tablet, Rfl: 3 .  Multiple Vitamin (MULTIVITAMIN PO), Take 1 tablet by mouth every morning. , Disp: , Rfl:  .  pramipexole (MIRAPEX) 0.25 MG tablet, TAKE ONE OR TWO TABLETS BY MOUTH AT BEDTIME AS NEEDED, Disp: , Rfl: 1 .  promethazine (PHENERGAN) 25 MG tablet, Take 1 tablet (25 mg total) by mouth every 6  (six) hours as needed for nausea or vomiting., Disp: 60 tablet, Rfl: 4 .  ranitidine (ZANTAC) 300 MG tablet, Take 1 tablet (300 mg total) by mouth at bedtime., Disp: 90 tablet, Rfl: 1 .  RESTASIS 0.05 % ophthalmic emulsion, PLACE ONE DROP INTO EACH EYE EVERY 12 HOURS, Disp: , Rfl: 3 .  Suvorexant (BELSOMRA) 15 MG TABS, Take 15 mg by mouth at bedtime as needed., Disp: 30 tablet, Rfl: 2 .  venlafaxine XR (EFFEXOR-XR) 150 MG 24 hr capsule, TAKE 1 CAPSULE (150 MG TOTAL) BY MOUTH DAILY WITH BREAKFAST., Disp: 60 capsule, Rfl: 0 .  venlafaxine XR (EFFEXOR-XR) 150 MG 24 hr capsule, TAKE 1 CAPSULE (150 MG TOTAL) BY MOUTH DAILY WITH BREAKFAST., Disp: 90 capsule, Rfl: 1 .  Vitamin D, Ergocalciferol, (DRISDOL) 50000 units CAPS capsule, Take 1 capsule (50,000 Units total) by mouth every 7 (seven) days., Disp: 12 capsule, Rfl: 1  Allergies: No Known Allergies  Past Medical History, Surgical history, Social history, and Family History were reviewed and updated.  Review of Systems: Review of Systems  Constitutional: Negative.   HENT: Negative.   Eyes: Negative.   Respiratory: Negative.   Cardiovascular: Negative.   Gastrointestinal: Negative.   Genitourinary: Negative.   Musculoskeletal: Positive for joint pain.  Skin:       Nonhealing wound in the upper posterior right shoulder.  Currently, this is dressed.  I saw a picture that she took when she was cleaning the wound.  Neurological: Negative.   Endo/Heme/Allergies: Negative.   Psychiatric/Behavioral: Negative.     Physical Exam:  weight is 145 lb (65.8 kg). Her oral temperature is 98.2 F (36.8 C). Her blood pressure is 148/82 (abnormal) and her pulse is 94. Her respiration is 19 and oxygen saturation is 100%.   Physical Exam  Constitutional: She is oriented to person, place, and time.  HENT:  Head: Normocephalic and atraumatic.  Mouth/Throat: Oropharynx is clear and moist.  Eyes: EOM are normal. Pupils are equal, round, and reactive to  light.  Neck: Normal range of motion.  Cardiovascular: Normal rate, regular rhythm and normal heart sounds.  Pulmonary/Chest: Effort normal and breath sounds normal.  Abdominal: Soft. Bowel sounds are normal.  Musculoskeletal: Normal range of motion. She exhibits no edema, tenderness or deformity.  Lymphadenopathy:    She has no cervical adenopathy.  Neurological: She is alert and oriented to person, place, and time.  Skin: Skin is warm and dry. No rash noted. No erythema.  Psychiatric: She has a normal mood and affect. Her behavior is normal. Judgment and thought content normal.  Vitals reviewed.    Lab Results  Component Value Date   WBC 5.2 09/25/2017   HGB 13.4 08/10/2017   HCT 36.6 09/25/2017  MCV 107.6 (H) 09/25/2017   PLT 889 (H) 09/25/2017     Chemistry      Component Value Date/Time   NA 139 08/28/2017 1259   NA 143 07/16/2017 1057   NA 140 11/29/2016 0936   K 3.5 08/28/2017 1259   K 3.2 (L) 07/16/2017 1057   K 4.2 11/29/2016 0936   CL 102 08/28/2017 1259   CL 104 07/16/2017 1057   CO2 30 (H) 08/28/2017 1259   CO2 25 07/16/2017 1057   CO2 27 11/29/2016 0936   BUN 10 08/28/2017 1259   BUN 10 07/16/2017 1057   BUN 18.3 11/29/2016 0936   CREATININE 0.83 08/28/2017 1259   CREATININE 0.8 07/16/2017 1057   CREATININE 0.8 11/29/2016 0936      Component Value Date/Time   CALCIUM 8.8 08/28/2017 1259   CALCIUM 8.9 07/16/2017 1057   CALCIUM 8.9 11/29/2016 0936   ALKPHOS 84 08/28/2017 1259   ALKPHOS 71 07/16/2017 1057   ALKPHOS 77 11/29/2016 0936   AST 21 08/28/2017 1259   AST 19 11/29/2016 0936   ALT 8 08/28/2017 1259   ALT 27 07/16/2017 1057   ALT 10 11/29/2016 0936   BILITOT <0.2 (L) 08/28/2017 1259   BILITOT <0.22 11/29/2016 0936         Impression and Plan: Ms. Riga is 67 year-old white female with essential thrombocythemia.  Hopefully, her platelet count is back up because of the recent infection that she had.  I will not change the anagrelide  dose right now.  I will plan to get her back in 4-5 weeks.  If her platelet count is not trended downward, then I will increase her anagrelide dose.  Hopefully, the wound on her right shoulder will continue to heal.      [name], MD 2/26/201912:06 PM

## 2017-10-07 ENCOUNTER — Other Ambulatory Visit: Payer: Self-pay | Admitting: Family Medicine

## 2017-10-07 DIAGNOSIS — R300 Dysuria: Secondary | ICD-10-CM

## 2017-10-07 DIAGNOSIS — R739 Hyperglycemia, unspecified: Secondary | ICD-10-CM

## 2017-10-07 DIAGNOSIS — I10 Essential (primary) hypertension: Secondary | ICD-10-CM

## 2017-10-07 DIAGNOSIS — E559 Vitamin D deficiency, unspecified: Secondary | ICD-10-CM

## 2017-10-30 ENCOUNTER — Inpatient Hospital Stay: Payer: Medicare Other | Attending: Hematology & Oncology | Admitting: Family

## 2017-10-30 ENCOUNTER — Inpatient Hospital Stay: Payer: Medicare Other

## 2017-10-30 ENCOUNTER — Other Ambulatory Visit: Payer: Self-pay | Admitting: *Deleted

## 2017-10-30 ENCOUNTER — Other Ambulatory Visit: Payer: Self-pay

## 2017-10-30 ENCOUNTER — Other Ambulatory Visit: Payer: Self-pay | Admitting: Family

## 2017-10-30 VITALS — BP 119/72 | HR 106 | Temp 98.0°F | Resp 20 | Wt 145.2 lb

## 2017-10-30 DIAGNOSIS — Z79899 Other long term (current) drug therapy: Secondary | ICD-10-CM | POA: Diagnosis not present

## 2017-10-30 DIAGNOSIS — Z7982 Long term (current) use of aspirin: Secondary | ICD-10-CM | POA: Diagnosis not present

## 2017-10-30 DIAGNOSIS — D473 Essential (hemorrhagic) thrombocythemia: Secondary | ICD-10-CM

## 2017-10-30 DIAGNOSIS — D509 Iron deficiency anemia, unspecified: Secondary | ICD-10-CM | POA: Insufficient documentation

## 2017-10-30 DIAGNOSIS — D500 Iron deficiency anemia secondary to blood loss (chronic): Secondary | ICD-10-CM

## 2017-10-30 LAB — SAVE SMEAR

## 2017-10-30 LAB — CBC WITH DIFFERENTIAL (CANCER CENTER ONLY)
Basophils Absolute: 0.1 10*3/uL (ref 0.0–0.1)
Basophils Relative: 1 %
Eosinophils Absolute: 0.4 10*3/uL (ref 0.0–0.5)
Eosinophils Relative: 5 %
HCT: 37 % (ref 34.8–46.6)
Hemoglobin: 11.8 g/dL (ref 11.6–15.9)
Lymphocytes Relative: 20 %
Lymphs Abs: 1.4 10*3/uL (ref 0.9–3.3)
MCH: 33.8 pg (ref 26.0–34.0)
MCHC: 31.9 g/dL — ABNORMAL LOW (ref 32.0–36.0)
MCV: 106 fL — ABNORMAL HIGH (ref 81.0–101.0)
Monocytes Absolute: 0.6 10*3/uL (ref 0.1–0.9)
Monocytes Relative: 8 %
Neutro Abs: 4.7 10*3/uL (ref 1.5–6.5)
Neutrophils Relative %: 66 %
Platelet Count: 932 10*3/uL — ABNORMAL HIGH (ref 145–400)
RBC: 3.49 MIL/uL — ABNORMAL LOW (ref 3.70–5.32)
RDW: 13.5 % (ref 11.1–15.7)
WBC Count: 7 10*3/uL (ref 3.9–10.0)

## 2017-10-30 LAB — CMP (CANCER CENTER ONLY)
ALT: 13 U/L (ref 0–55)
AST: 23 U/L (ref 5–34)
Albumin: 3.7 g/dL (ref 3.5–5.0)
Alkaline Phosphatase: 80 U/L (ref 40–150)
Anion gap: 8 (ref 3–11)
BUN: 17 mg/dL (ref 7–26)
CO2: 22 mmol/L (ref 22–29)
Calcium: 9.1 mg/dL (ref 8.4–10.4)
Chloride: 110 mmol/L — ABNORMAL HIGH (ref 98–109)
Creatinine: 0.78 mg/dL (ref 0.60–1.10)
GFR, Est AFR Am: >60 mL/min (ref >60)
GFR, Estimated: >60 mL/min (ref >60)
Glucose, Bld: 89 mg/dL (ref 70–140)
Potassium: 3.7 mmol/L (ref 3.5–5.1)
Sodium: 140 mmol/L (ref 136–145)
Total Bilirubin: <0.2 mg/dL — ABNORMAL LOW (ref 0.2–1.2)
Total Protein: 7.3 g/dL (ref 6.4–8.3)

## 2017-10-30 LAB — LACTATE DEHYDROGENASE: LDH: 243 U/L (ref 125–245)

## 2017-10-30 MED ORDER — ANAGRELIDE HCL 1 MG PO CAPS: 1.0000 mg | ORAL_CAPSULE | Freq: Three times a day (TID) | ORAL | 3 refills | Status: DC

## 2017-10-30 NOTE — Progress Notes (Signed)
Hematology and Oncology Follow Up Visit  Rachael Johnson [id number] [birth year] 66 y.o. 10/30/2017   Principle Diagnosis:  Essential thrombocythemia - JAK2 negative Intermittent iron-deficiency anemia  Past Therapy: Hydrea 1000/1000/500 mg by mouth daily - on hold starting 05/14/2017 - DC'd on 05/28/2017  Current Therapy:    Anagrelide 1 mg by mouth twice daily - started on 07/16/2017 Aspirin 81 mg by mouth daily IV iron as indicated - last dose given on 04/03/2017   Interim History:  Rachael Johnson is here today for follow-up. She is feeling fatigued and has a decrease in her appetite. Her weight is stable and she states that she is staying hydrated.  Her platelet count is up to 933, Hgb 11.8 and WBC count 7.0. She verbalized that she is is taking her daily aspirin and Anagrelide 1 mg PO BID as prescribed.  She states that she has never had a bone marrow biopsy.  She has had no episodes of bleeding. No bruising or petechiae.  No lymphadenopathy noted on her exam.  No fever, chills, n/v, cough, rash, dizziness, SOB, chest pain, palpitations, abdominal pain or changes in bowel or bladder habits.  She has pain in her knees and swelling in her joints due to arthritis. This waxes and wanes. The numbness and tingling in her hands and feet is unchanged.   ECOG Performance Status: 1 - Symptomatic but completely ambulatory  Medications:  Allergies as of 10/30/2017   No Known Allergies     Medication List        Accurate as of 10/30/17 10:25 AM. Always use your most recent med list.          albuterol 108 (90 Base) MCG/ACT inhaler Commonly known as:  VENTOLIN HFA INHALE 2 PUFFS INTO THE LUNGS EVERY 6 (SIX) HOURS AS NEEDED FOR WHEEZING OR SHORTNESS OF BREATH.  Can use Ventolin, ProAir or Proventil whichever cheaper   ALPRAZolam 1 MG tablet Commonly known as:  XANAX TAKE 1 TABLET BY MOUTH THREE TIMES A DAY AS NEEDED FOR ANXIETY   anagrelide 1 MG capsule Commonly known as:   AGRYLIN Take 1 capsule (1 mg total) by mouth 2 (two) times daily.   aspirin EC 81 MG tablet Take 81 mg by mouth every morning.   budesonide-formoterol 80-4.5 MCG/ACT inhaler Commonly known as:  SYMBICORT Inhale 2 puffs into the lungs 2 (two) times daily.   busPIRone 10 MG tablet Commonly known as:  BUSPAR Take 1 tablet (10 mg total) by mouth 3 (three) times daily.   cetirizine 10 MG tablet Commonly known as:  ZYRTEC Take 1 tablet (10 mg total) by mouth daily.   chlorpheniramine 4 MG tablet Commonly known as:  CHLOR-TRIMETON Take 8 mg by mouth at bedtime.   collagenase ointment Commonly known as:  SANTYL Apply topically daily.   Cranberry 300 MG tablet Take 300 mg by mouth 2 (two) times daily.   cyclobenzaprine 10 MG tablet Commonly known as:  FLEXERIL TAKE 1 TABLET (10 MG TOTAL) BY MOUTH 2 (TWO) TIMES DAILY AS NEEDED FOR MUSCLE SPASMS.   estradiol 0.5 MG tablet Commonly known as:  ESTRACE TAKE 1 TABLET (0.5 MG TOTAL) BY MOUTH 2 (TWO) TIMES DAILY.   fluticasone 50 MCG/ACT nasal spray Commonly known as:  FLONASE Place 2 sprays into both nostrils daily.   furosemide 20 MG tablet Commonly known as:  LASIX Take 1 tablet (20 mg total) by mouth daily as needed.   furosemide 20 MG tablet Commonly known as:  LASIX TAKE  1 TABLET (20 MG TOTAL) BY MOUTH 2 (TWO) TIMES DAILY AS NEEDED FOR FLUID OR EDEMA.   HYDROcodone-acetaminophen 7.5-325 MG tablet Commonly known as:  NORCO Take 1 tablet by mouth every 6 (six) hours as needed for moderate pain.   lactulose 10 GM/15ML solution Commonly known as:  CHRONULAC TAKE 2 TABLESPOONFULS (30 ML) BY MOUTH 3 TIMES A DAY FOR CONSTIPATION   levothyroxine 112 MCG tablet Commonly known as:  SYNTHROID, LEVOTHROID Take 1 tablet (112 mcg total) by mouth daily.   meclizine 12.5 MG tablet Commonly known as:  ANTIVERT TAKE 1 TABLET THREE TIMES A DAY AS NEEDED FOR NAUSEA OR DIZZINESS   montelukast 10 MG tablet Commonly known as:   SINGULAIR Take 1 tablet (10 mg total) by mouth at bedtime.   MULTIVITAMIN PO Take 1 tablet by mouth every morning.   pramipexole 0.25 MG tablet Commonly known as:  MIRAPEX TAKE ONE OR TWO TABLETS BY MOUTH AT BEDTIME AS NEEDED   promethazine 25 MG tablet Commonly known as:  PHENERGAN Take 1 tablet (25 mg total) by mouth every 6 (six) hours as needed for nausea or vomiting.   ranitidine 300 MG tablet Commonly known as:  ZANTAC Take 1 tablet (300 mg total) by mouth at bedtime.   RESTASIS 0.05 % ophthalmic emulsion Generic drug:  cycloSPORINE PLACE ONE DROP INTO EACH EYE EVERY 12 HOURS   Suvorexant 15 MG Tabs Commonly known as:  BELSOMRA Take 15 mg by mouth at bedtime as needed.   venlafaxine XR 150 MG 24 hr capsule Commonly known as:  EFFEXOR-XR TAKE 1 CAPSULE (150 MG TOTAL) BY MOUTH DAILY WITH BREAKFAST.   Vitamin D (Ergocalciferol) 50000 units Caps capsule Commonly known as:  DRISDOL Take 1 capsule (50,000 Units total) by mouth every 7 (seven) days.       Allergies: No Known Allergies  Past Medical History, Surgical history, Social history, and Family History were reviewed and updated.  Review of Systems: All other 10 point review of systems is negative.   Physical Exam:  weight is 145 lb 4 oz (65.9 kg). Her oral temperature is 98 F (36.7 C). Her blood pressure is 119/72 and her pulse is 106 (abnormal). Her respiration is 20 and oxygen saturation is 99%.   Wt Readings from Last 3 Encounters:  10/30/17 145 lb 4 oz (65.9 kg)  09/25/17 145 lb (65.8 kg)  09/07/17 142 lb 3.2 oz (64.5 kg)    Ocular: Sclerae unicteric, pupils equal, round and reactive to light Ear-nose-throat: Oropharynx clear, dentition fair Lymphatic: No cervical, supraclavicular or axillary adenopathy Lungs no rales or rhonchi, good excursion bilaterally Heart regular rate and rhythm, no murmur appreciated Abd soft, nontender, positive bowel sounds, no liver or spleen tip palpated on exam, no  fluid wave  MSK no focal spinal tenderness, no joint edema Neuro: non-focal, well-oriented, appropriate affect Breasts: Deferred   Lab Results  Component Value Date   WBC 7.0 10/30/2017   HGB 13.4 08/10/2017   HCT 37.0 10/30/2017   MCV 106.0 (H) 10/30/2017   PLT 932 (H) 10/30/2017   Lab Results  Component Value Date   FERRITIN 654 (H) 09/25/2017   IRON 100 09/25/2017   TIBC 208 (L) 09/25/2017   UIBC 107 09/25/2017   IRONPCTSAT 48 09/25/2017   Lab Results  Component Value Date   RETICCTPCT 1.6 06/02/2015   RBC 3.49 (L) 10/30/2017   RETICCTABS 45.8 06/02/2015   No results found for: KPAFRELGTCHN, LAMBDASER, KAPLAMBRATIO No results found for: IGGSERUM, IGA,  IGMSERUM No results found for: Odetta Pink, SPEI   Chemistry      Component Value Date/Time   NA 140 09/25/2017 1050   NA 143 07/16/2017 1057   NA 140 11/29/2016 0936   K 4.0 09/25/2017 1050   K 3.2 (L) 07/16/2017 1057   K 4.2 11/29/2016 0936   CL 103 09/25/2017 1050   CL 104 07/16/2017 1057   CO2 28 09/25/2017 1050   CO2 25 07/16/2017 1057   CO2 27 11/29/2016 0936   BUN 11 09/25/2017 1050   BUN 10 07/16/2017 1057   BUN 18.3 11/29/2016 0936   CREATININE 0.76 09/25/2017 1050   CREATININE 0.8 07/16/2017 1057   CREATININE 0.8 11/29/2016 0936      Component Value Date/Time   CALCIUM 9.3 09/25/2017 1050   CALCIUM 8.9 07/16/2017 1057   CALCIUM 8.9 11/29/2016 0936   ALKPHOS 100 09/25/2017 1050   ALKPHOS 71 07/16/2017 1057   ALKPHOS 77 11/29/2016 0936   AST 20 09/25/2017 1050   AST 19 11/29/2016 0936   ALT 10 09/25/2017 1050   ALT 27 07/16/2017 1057   ALT 10 11/29/2016 0936   BILITOT <0.2 (L) 09/25/2017 1050   BILITOT <0.22 11/29/2016 0936      Impression and Plan: Ms. Bless is a very pleasant 67 yo caucasian female with essential thrombocythemia. Her platelet count continues to climb and is now 932. I spoke with Dr. Marin Olp and we will increase her  Anagrelide to TID. She verbalized understanding of this.  We will also schedule her for a bone marrow biopsy.  We will plan to see her back in another month for follow-up.  She will contact our office with any questions or concerns. We can certainly see her sooner if need be.    Laverna Peace, NP 4/2/201910:25 AM

## 2017-10-31 ENCOUNTER — Other Ambulatory Visit: Payer: Self-pay | Admitting: Family Medicine

## 2017-10-31 LAB — IRON AND TIBC
Iron: 55 ug/dL (ref 41–142)
Saturation Ratios: 26 % (ref 21–57)
TIBC: 214 ug/dL — ABNORMAL LOW (ref 236–444)
UIBC: 159 ug/dL

## 2017-10-31 LAB — FERRITIN: Ferritin: 631 ng/mL — ABNORMAL HIGH (ref 9–269)

## 2017-11-01 NOTE — Telephone Encounter (Signed)
[name] 1 MG Contract: 12/22/16 UDS: 09/07/17 MODERATE RISK Last OV: 09/07/17 Next OV:4/819 Last Refill:  05/24/17  #[id number]   Please advise

## 2017-11-05 ENCOUNTER — Ambulatory Visit (INDEPENDENT_AMBULATORY_CARE_PROVIDER_SITE_OTHER): Payer: Medicare Other | Admitting: Family Medicine

## 2017-11-05 VITALS — BP 98/72 | HR 117 | Temp 97.7°F | Resp 18 | Wt 143.8 lb

## 2017-11-05 DIAGNOSIS — E559 Vitamin D deficiency, unspecified: Secondary | ICD-10-CM

## 2017-11-05 DIAGNOSIS — N289 Disorder of kidney and ureter, unspecified: Secondary | ICD-10-CM

## 2017-11-05 DIAGNOSIS — I10 Essential (primary) hypertension: Secondary | ICD-10-CM | POA: Diagnosis not present

## 2017-11-05 DIAGNOSIS — E039 Hypothyroidism, unspecified: Secondary | ICD-10-CM

## 2017-11-05 DIAGNOSIS — J019 Acute sinusitis, unspecified: Secondary | ICD-10-CM

## 2017-11-05 DIAGNOSIS — R739 Hyperglycemia, unspecified: Secondary | ICD-10-CM

## 2017-11-05 DIAGNOSIS — T7840XD Allergy, unspecified, subsequent encounter: Secondary | ICD-10-CM

## 2017-11-05 DIAGNOSIS — D473 Essential (hemorrhagic) thrombocythemia: Secondary | ICD-10-CM | POA: Diagnosis not present

## 2017-11-05 DIAGNOSIS — S41001S Unspecified open wound of right shoulder, sequela: Secondary | ICD-10-CM

## 2017-11-05 DIAGNOSIS — G8929 Other chronic pain: Secondary | ICD-10-CM | POA: Diagnosis not present

## 2017-11-05 DIAGNOSIS — Z1231 Encounter for screening mammogram for malignant neoplasm of breast: Secondary | ICD-10-CM

## 2017-11-05 DIAGNOSIS — M549 Dorsalgia, unspecified: Secondary | ICD-10-CM

## 2017-11-05 DIAGNOSIS — Z1239 Encounter for other screening for malignant neoplasm of breast: Secondary | ICD-10-CM

## 2017-11-05 DIAGNOSIS — J329 Chronic sinusitis, unspecified: Secondary | ICD-10-CM | POA: Insufficient documentation

## 2017-11-05 LAB — TSH: TSH: 1.6 u[IU]/mL (ref 0.35–4.50)

## 2017-11-05 LAB — VITAMIN D 25 HYDROXY (VIT D DEFICIENCY, FRACTURES): VITD: 36.73 ng/mL (ref 30.00–100.00)

## 2017-11-05 MED ORDER — HYDROCODONE-ACETAMINOPHEN 7.5-325 MG PO TABS: 1.0000 | ORAL_TABLET | Freq: Four times a day (QID) | ORAL | 0 refills | Status: DC | PRN

## 2017-11-05 MED ORDER — CETIRIZINE HCL 10 MG PO TABS: 10.0000 mg | ORAL_TABLET | Freq: Every day | ORAL | 1 refills | Status: DC

## 2017-11-05 MED ORDER — AMOXICILLIN-POT CLAVULANATE 875-125 MG PO TABS
1.0000 | ORAL_TABLET | Freq: Two times a day (BID) | ORAL | 0 refills | Status: DC
Start: 2017-11-05 — End: 2017-11-29

## 2017-11-05 NOTE — Assessment & Plan Note (Signed)
They are proceeding with biopsy due to worsening

## 2017-11-05 NOTE — Assessment & Plan Note (Signed)
Encouraged moist heat and gentle stretching as tolerated. May try NSAIDs and prescription meds as directed and report if symptoms worsen or seek immediate care. Allowed refill on Hydrocodone. UDS and contract UTD

## 2017-11-05 NOTE — Progress Notes (Signed)
Subjective:  I acted as a Education administrator for Dr. Charlett Blake. Rachael Johnson, [place]  Patient ID: Rachael Johnson, female    DOB: 1950-12-01, 67 y.o.   MRN: [id number]  No chief complaint on file.   HPI  Patient is in today for a 2 month follow up. Patient c/o sinus pressure. She has felt poorly this week.  She notes increased facial pressure and pain.  She notes rhinorrhea and itchy irritated ears.  She also notes fevers, malaise.  She has a cough that is productive at times.  She had been feeling well until this week.  She continues to struggle with back pain but finds her medications helpful when she needs them.  Her cough has worsened to the point where she has used her Symbicort again.  She is not using it regularly due to the cost. Denies any CP/palp/GI or GU c/o. Taking meds as prescribed  Patient Care Team: Mosie Lukes, MD as PCP - General (Family Medicine) Marin Olp Rudell Cobb, MD as Consulting Physician (Oncology) Glenna Fellows, MD as Attending Physician (Neurosurgery)   Past Medical History:  Diagnosis Date  . Acute renal insufficiency 04/09/2017  . Anemia, iron deficiency 07/08/2012  . Arthritis   . Cataract 12/21/2014  . Depression with anxiety 03/24/2011  . Diverticulitis   . Esophageal reflux 08/17/2013  . Essential thrombocythemia ([place]) 01/24/2011  . Frequent episodic tension-type headache   . Gastroenteritis 12/21/2014  . 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 GERD (gastroesophageal reflux disease)   . H. pylori infection 12/19/2012  . Hyperglycemia 12/17/2015  . Hypertension   . Iron deficiency anemia due to chronic blood loss 04/03/2017  . Osteoarthritis 05/24/2017  . Other and unspecified hyperlipidemia 02/25/2013  . Restless leg syndrome 09/30/2014  . Rhabdomyolysis 02/2017  . Scabies 03/28/2015  . Seizure ([place])    childhood  . Shoulder wound, right, sequela 03/14/2017  . Thyroid disease     Past Surgical History:  Procedure Laterality Date  . ABDOMINAL HYSTERECTOMY     1992  . BREAST SURGERY  1992     biopsy, benign. Fibrocystic  . UMBILICAL HERNIA REPAIR N/A 09/25/2012   Procedure: HERNIA REPAIR UMBILICAL ADULT;  Surgeon: Harl Bowie, MD;  Location: [facility];  Service: General;  Laterality: N/A;    Family History  Problem Relation Age of Onset  . Arthritis Mother   . Cancer Mother        ovarian  . Hypertension Mother   . Heart disease Mother        pacer  . Heart failure Mother   . Arthritis Father   . Cancer Father        lung  . Hypertension Father   . Cancer Sister        lung  . Cancer Brother        prostate  . Cancer Brother        lung  . Hypertension Son   . COPD Brother   . Heart disease Brother        died from CHF  . Hypertension Brother   . Cancer Brother        colon  . Alcohol abuse Brother   . Cirrhosis Brother   . Seizures Sister   . Stroke Sister   . Arthritis Brother     Social History   Socioeconomic History  . Marital status: Widowed    Spouse name: Not on file  . Number of children: 2  . Years of education: Not  on file  . Highest education level: Not on file  Occupational History  . Occupation: APL IT sales professional: [employer]    Comment: [place]  . Financial resource strain: Not on file  . Food insecurity:    Worry: Not on file    Inability: Not on file  . Transportation needs:    Medical: Not on file    Non-medical: Not on file  Tobacco Use  . Smoking status: Former Smoker    Packs/day: 3.50    Years: 20.00    Pack years: 70.00    Types: Cigarettes    Start date: 11/15/1970    Last attempt to quit: 07/31/1990    Years since quitting: 27.2  . Smokeless tobacco: Never Used  . Tobacco comment: quit 23 years ago  Substance and Sexual Activity  . Alcohol use: No    Alcohol/week: 0.0 oz  . Drug use: No  . Sexual activity: Never  Lifestyle  . Physical activity:    Days per week: Not on file    Minutes per session: Not on file  . Stress: Not on file  Relationships  . Social connections:     Talks on phone: Not on file    Gets together: Not on file    Attends religious service: Not on file    Active member of club or organization: Not on file    Attends meetings of clubs or organizations: Not on file    Relationship status: Not on file  . Intimate partner violence:    Fear of current or ex partner: Not on file    Emotionally abused: Not on file    Physically abused: Not on file    Forced sexual activity: Not on file  Other Topics Concern  . Not on file  Social History Narrative   Regular exercise: no   Caffeine Use: 2-3 weekly    Outpatient Medications Prior to Visit  Medication Sig Dispense Refill  . albuterol (VENTOLIN HFA) 108 (90 Base) MCG/ACT inhaler INHALE 2 PUFFS INTO THE LUNGS EVERY 6 (SIX) HOURS AS NEEDED FOR WHEEZING OR SHORTNESS OF BREATH.  Can use Ventolin, ProAir or Proventil whichever cheaper 54 Inhaler 3  . ALPRAZolam (XANAX) 1 MG tablet TAKE 1 TABLET 3 TIMES A DAY AS NEEDED FOR ANIXETY 180 tablet 1  . anagrelide (AGRYLIN) 1 MG capsule Take 1 capsule (1 mg total) by mouth 3 (three) times daily. 90 capsule 3  . aspirin EC 81 MG tablet Take 81 mg by mouth every morning.    . budesonide-formoterol (SYMBICORT) 80-4.5 MCG/ACT inhaler Inhale 2 puffs into the lungs 2 (two) times daily. 1 Inhaler 0  . busPIRone (BUSPAR) 10 MG tablet Take 1 tablet (10 mg total) by mouth 3 (three) times daily. 90 tablet 0  . chlorpheniramine (CHLOR-TRIMETON) 4 MG tablet Take 8 mg by mouth at bedtime.    . collagenase (SANTYL) ointment Apply topically daily. 15 g 0  . Cranberry 300 MG tablet Take 300 mg by mouth 2 (two) times daily.    . cyclobenzaprine (FLEXERIL) 10 MG tablet TAKE 1 TABLET (10 MG TOTAL) BY MOUTH 2 (TWO) TIMES DAILY AS NEEDED FOR MUSCLE SPASMS. 60 tablet 3  . estradiol (ESTRACE) 0.5 MG tablet TAKE 1 TABLET (0.5 MG TOTAL) BY MOUTH 2 (TWO) TIMES DAILY. 180 tablet 1  . fluticasone (FLONASE) 50 MCG/ACT nasal spray Place 2 sprays into both nostrils daily. 48 g 3  .  furosemide (LASIX) 20 MG  tablet TAKE 1 TABLET (20 MG TOTAL) BY MOUTH 2 (TWO) TIMES DAILY AS NEEDED FOR FLUID OR EDEMA. 90 tablet 0  . lactulose (CHRONULAC) 10 GM/15ML solution TAKE 2 TABLESPOONFULS (30 ML) BY MOUTH 3 TIMES A DAY FOR CONSTIPATION 1000 mL 8  . levothyroxine (SYNTHROID, LEVOTHROID) 112 MCG tablet Take 1 tablet (112 mcg total) by mouth daily. 30 tablet 3  . meclizine (ANTIVERT) 12.5 MG tablet TAKE 1 TABLET THREE TIMES A DAY AS NEEDED FOR NAUSEA OR DIZZINESS 30 tablet 1  . montelukast (SINGULAIR) 10 MG tablet Take 1 tablet (10 mg total) by mouth at bedtime. 90 tablet 3  . Multiple Vitamin (MULTIVITAMIN PO) Take 1 tablet by mouth every morning.     . pramipexole (MIRAPEX) 0.25 MG tablet TAKE ONE OR TWO TABLETS BY MOUTH AT BEDTIME AS NEEDED  1  . ranitidine (ZANTAC) 300 MG tablet Take 1 tablet (300 mg total) by mouth at bedtime. 90 tablet 1  . RESTASIS 0.05 % ophthalmic emulsion PLACE ONE DROP INTO EACH EYE EVERY 12 HOURS  3  . Suvorexant (BELSOMRA) 15 MG TABS Take 15 mg by mouth at bedtime as needed. 30 tablet 2  . venlafaxine XR (EFFEXOR-XR) 150 MG 24 hr capsule TAKE 1 CAPSULE (150 MG TOTAL) BY MOUTH DAILY WITH BREAKFAST. 90 capsule 1  . Vitamin D, Ergocalciferol, (DRISDOL) 50000 units CAPS capsule Take 1 capsule (50,000 Units total) by mouth every 7 (seven) days. 12 capsule 1  . cetirizine (ZYRTEC) 10 MG tablet Take 1 tablet (10 mg total) by mouth daily. 90 tablet 1  . furosemide (LASIX) 20 MG tablet Take 1 tablet (20 mg total) by mouth daily as needed. 30 tablet 3  . HYDROcodone-acetaminophen (NORCO) 7.5-325 MG tablet Take 1 tablet by mouth every 6 (six) hours as needed for moderate pain. 120 tablet 0  . promethazine (PHENERGAN) 25 MG tablet Take 1 tablet (25 mg total) by mouth every 6 (six) hours as needed for nausea or vomiting. 60 tablet 4   No facility-administered medications prior to visit.     No Known Allergies  Review of Systems  Constitutional: Positive for fever and  malaise/fatigue. Negative for chills.  HENT: Positive for congestion, ear pain and sinus pain.   Eyes: Negative for blurred vision.  Respiratory: Positive for cough and sputum production. Negative for shortness of breath.   Cardiovascular: Negative for chest pain, palpitations and leg swelling.  Gastrointestinal: Negative for abdominal pain, blood in stool and nausea.  Genitourinary: Negative for dysuria and frequency.  Musculoskeletal: Positive for back pain, myalgias and neck pain. Negative for falls.  Skin: Negative for rash.  Neurological: Negative for dizziness, loss of consciousness and headaches.  Endo/Heme/Allergies: Negative for environmental allergies.  Psychiatric/Behavioral: Negative for depression. The patient is not nervous/anxious.        Objective:    Physical Exam  Constitutional: She is oriented to person, place, and time. She appears well-developed and well-nourished. No distress.  HENT:  Head: Normocephalic and atraumatic.  Nose: Nose normal.  TMs erythematous and dull. Nasal mucosa boggy and erythematous  Eyes: Right eye exhibits no discharge. Left eye exhibits no discharge.  Neck: Normal range of motion. Neck supple.  Cardiovascular: Normal rate and regular rhythm.  No murmur heard. Pulmonary/Chest: Effort normal and breath sounds normal.  Abdominal: Soft. Bowel sounds are normal. There is no tenderness.  Musculoskeletal: She exhibits no edema.  Neurological: She is alert and oriented to person, place, and time.  Skin: Skin is warm and dry.  Psychiatric: She has a normal mood and affect.  Nursing note and vitals reviewed.   BP 98/72 (BP Location: Left Arm, Patient Position: Sitting, Cuff Size: Normal)   Pulse (!) 117   Temp 97.7 F (36.5 C) (Oral)   Resp 18   Wt 143 lb 12.8 oz (65.2 kg)   LMP 07/31/1990   SpO2 97%   BMI 25.48 kg/m  Wt Readings from Last 3 Encounters:  11/05/17 143 lb 12.8 oz (65.2 kg)  10/30/17 145 lb 4 oz (65.9 kg)  09/25/17 145  lb (65.8 kg)   BP Readings from Last 3 Encounters:  11/05/17 98/72  10/30/17 119/72  09/25/17 (!) 148/82     Immunization History  Administered Date(s) Administered  . Influenza Whole 05/16/2013  . Influenza, High Dose Seasonal PF 04/30/2017  . Influenza,inj,Quad PF,6+ Mos 04/02/2015, 04/18/2016  . Influenza-Unspecified 05/31/2014  . Pneumococcal Conjugate-13 04/04/2013  . Pneumococcal Polysaccharide-23 04/18/2016  . Pneumococcal-Unspecified 04/10/2011  . Td 08/01/2007  . Tdap 07/06/2014  . Zoster 04/28/2013    Health Maintenance  Topic Date Due  . Hepatitis C Screening  [birth year]  . PAP SMEAR  10/30/2014  . INFLUENZA VACCINE  02/28/2018  . MAMMOGRAM  11/03/2018  . COLONOSCOPY  11/28/2020  . PNA vac Low Risk Adult (2 of 2 - PPSV23) 04/18/2021  . TETANUS/TDAP  07/06/2024  . DEXA SCAN  Completed  . HIV Screening  Completed    Lab Results  Component Value Date   WBC 7.0 10/30/2017   HGB 13.4 08/10/2017   HCT 37.0 10/30/2017   PLT 932 (H) 10/30/2017   GLUCOSE 89 10/30/2017   CHOL 250 (H) 08/10/2017   TRIG 313.0 (H) 08/10/2017   HDL 56.30 08/10/2017   LDLDIRECT 123.0 08/10/2017   LDLCALC 82 02/12/2017   ALT 13 10/30/2017   AST 23 10/30/2017   NA 140 10/30/2017   K 3.7 10/30/2017   CL 110 (H) 10/30/2017   CREATININE 0.78 10/30/2017   BUN 17 10/30/2017   CO2 22 10/30/2017   TSH 48.45 (H) 08/10/2017   INR 1.23 03/13/2017   HGBA1C 5.0 08/10/2017    Lab Results  Component Value Date   TSH 48.45 (H) 08/10/2017   Lab Results  Component Value Date   WBC 7.0 10/30/2017   HGB 13.4 08/10/2017   HCT 37.0 10/30/2017   MCV 106.0 (H) 10/30/2017   PLT 932 (H) 10/30/2017   Lab Results  Component Value Date   NA 140 10/30/2017   K 3.7 10/30/2017   CHLORIDE 105 11/29/2016   CO2 22 10/30/2017   GLUCOSE 89 10/30/2017   BUN 17 10/30/2017   CREATININE 0.78 10/30/2017   BILITOT <0.2 (L) 10/30/2017   ALKPHOS 80 10/30/2017   AST 23 10/30/2017   ALT 13 10/30/2017     PROT 7.3 10/30/2017   ALBUMIN 3.7 10/30/2017   CALCIUM 9.1 10/30/2017   ANIONGAP 8 10/30/2017   EGFR 80 (L) 11/29/2016   GFR 86.26 08/10/2017   Lab Results  Component Value Date   CHOL 250 (H) 08/10/2017   Lab Results  Component Value Date   HDL 56.30 08/10/2017   Lab Results  Component Value Date   LDLCALC 82 02/12/2017   Lab Results  Component Value Date   TRIG 313.0 (H) 08/10/2017   Lab Results  Component Value Date   CHOLHDL 4 08/10/2017   Lab Results  Component Value Date   HGBA1C 5.0 08/10/2017         Assessment & Plan:  Problem List Items Addressed This Visit    Hypothyroid    Repeat TSH today      Relevant Orders   TSH   Essential thrombocythemia ([place])    They are proceeding with biopsy due to worsening      Relevant Medications   amoxicillin-clavulanate (AUGMENTIN) 875-125 MG tablet   Allergy    Flared recently star Cetirizine once or twice and Flonase report if no improvement.      Hyperglycemia    hgba1c acceptable, minimize simple carbs. Increase exercise as tolerated.       Vitamin D deficiency    Check level today, not taking supplements      Relevant Orders   Vitamin D (25 hydroxy)   Essential hypertension    Well controlled, no changes to meds. Encouraged heart healthy diet such as the DASH diet and exercise as tolerated.       Shoulder wound, right, sequela    Wound closed and healing well. No surrounding fluctuance      Acute renal insufficiency    Kidneys stable and doing much better, recent labs reviewed      Chronic back pain    Encouraged moist heat and gentle stretching as tolerated. May try NSAIDs and prescription meds as directed and report if symptoms worsen or seek immediate care. Allowed refill on Hydrocodone. UDS and contract UTD      Relevant Medications   HYDROcodone-acetaminophen (NORCO) 7.5-325 MG tablet   Sinusitis    Mucinex twice daily, Augmentin and a probiotic daily      Relevant  Medications   amoxicillin-clavulanate (AUGMENTIN) 875-125 MG tablet   cetirizine (ZYRTEC) 10 MG tablet    Other Visit Diagnoses    Breast cancer screening    -  Primary   Relevant Orders   MM 3D SCREEN BREAST BILATERAL      I have discontinued Rolando D. Norkus's promethazine. I am also having her start on amoxicillin-clavulanate. Additionally, I am having her maintain her Multiple Vitamin (MULTIVITAMIN PO), aspirin EC, Cranberry, albuterol, fluticasone, meclizine, ranitidine, lactulose, montelukast, Vitamin D (Ergocalciferol), chlorpheniramine, busPIRone, collagenase, budesonide-formoterol, RESTASIS, pramipexole, Suvorexant, levothyroxine, estradiol, venlafaxine XR, cyclobenzaprine, furosemide, anagrelide, ALPRAZolam, HYDROcodone-acetaminophen, and cetirizine.  Meds ordered this encounter  Medications  . HYDROcodone-acetaminophen (NORCO) 7.5-325 MG tablet    Sig: Take 1 tablet by mouth every 6 (six) hours as needed for moderate pain.    Dispense:  120 tablet    Refill:  0  . amoxicillin-clavulanate (AUGMENTIN) 875-125 MG tablet    Sig: Take 1 tablet by mouth 2 (two) times daily.    Dispense:  20 tablet    Refill:  0  . cetirizine (ZYRTEC) 10 MG tablet    Sig: Take 1 tablet (10 mg total) by mouth daily.    Dispense:  90 tablet    Refill:  1    CMA served as Education administrator during this visit. History, Physical and Plan performed by medical provider. Documentation and orders reviewed and attested to.  Penni Homans, MD

## 2017-11-05 NOTE — Assessment & Plan Note (Signed)
Kidneys stable and doing much better, recent labs reviewed

## 2017-11-05 NOTE — Assessment & Plan Note (Signed)
Repeat TSH today

## 2017-11-05 NOTE — Assessment & Plan Note (Signed)
Mucinex twice daily, Augmentin and a probiotic daily

## 2017-11-05 NOTE — Assessment & Plan Note (Signed)
hgba1c acceptable, minimize simple carbs. Increase exercise as tolerated.  

## 2017-11-05 NOTE — Assessment & Plan Note (Signed)
Check level today, not taking supplements

## 2017-11-05 NOTE — Assessment & Plan Note (Signed)
Flared recently star Cetirizine once or twice and Flonase report if no improvement.

## 2017-11-05 NOTE — Assessment & Plan Note (Signed)
Wound closed and healing well. No surrounding fluctuance

## 2017-11-05 NOTE — Assessment & Plan Note (Signed)
Well controlled, no changes to meds. Encouraged heart healthy diet such as the DASH diet and exercise as tolerated.  °

## 2017-11-05 NOTE — Patient Instructions (Signed)
Good Rx or CoverMyMeds Sinusitis, Adult Sinusitis is soreness and inflammation of your sinuses. Sinuses are hollow spaces in the bones around your face. Your sinuses are located:  Around your eyes.  In the middle of your forehead.  Behind your nose.  In your cheekbones.  Your sinuses and nasal passages are lined with a stringy fluid (mucus). Mucus normally drains out of your sinuses. When your nasal tissues become inflamed or swollen, the mucus can become trapped or blocked so air cannot flow through your sinuses. This allows bacteria, viruses, and funguses to grow, which leads to infection. Sinusitis can develop quickly and last for 7?10 days (acute) or for more than 12 weeks (chronic). Sinusitis often develops after a cold. What are the causes? This condition is caused by anything that creates swelling in the sinuses or stops mucus from draining, including:  Allergies.  Asthma.  Bacterial or viral infection.  Abnormally shaped bones between the nasal passages.  Nasal growths that contain mucus (nasal polyps).  Narrow sinus openings.  Pollutants, such as chemicals or irritants in the air.  A foreign object stuck in the nose.  A fungal infection. This is rare.  What increases the risk? The following factors may make you more likely to develop this condition:  Having allergies or asthma.  Having had a recent cold or respiratory tract infection.  Having structural deformities or blockages in your nose or sinuses.  Having a weak immune system.  Doing a lot of swimming or diving.  Overusing nasal sprays.  Smoking.  What are the signs or symptoms? The main symptoms of this condition are pain and a feeling of pressure around the affected sinuses. Other symptoms include:  Upper toothache.  Earache.  Headache.  Bad breath.  Decreased sense of smell and taste.  A cough that may get worse at night.  Fatigue.  Fever.  Thick drainage from your nose. The  drainage is often green and it may contain pus (purulent).  Stuffy nose or congestion.  Postnasal drip. This is when extra mucus collects in the throat or back of the nose.  Swelling and warmth over the affected sinuses.  Sore throat.  Sensitivity to light.  How is this diagnosed? This condition is diagnosed based on symptoms, a medical history, and a physical exam. To find out if your condition is acute or chronic, your health care provider may:  Look in your nose for signs of nasal polyps.  Tap over the affected sinus to check for signs of infection.  View the inside of your sinuses using an imaging device that has a light attached (endoscope).  If your health care provider suspects that you have chronic sinusitis, you may also:  Be tested for allergies.  Have a sample of mucus taken from your nose (nasal culture) and checked for bacteria.  Have a mucus sample examined to see if your sinusitis is related to an allergy.  If your sinusitis does not respond to treatment and it lasts longer than 8 weeks, you may have an MRI or CT scan to check your sinuses. These scans also help to determine how severe your infection is. In rare cases, a bone biopsy may be done to rule out more serious types of fungal sinus disease. How is this treated? Treatment for sinusitis depends on the cause and whether your condition is chronic or acute. If a virus is causing your sinusitis, your symptoms will go away on their own within 10 days. You may be given medicines  to relieve your symptoms, including:  Topical nasal decongestants. They shrink swollen nasal passages and let mucus drain from your sinuses.  Antihistamines. These drugs block inflammation that is triggered by allergies. This can help to ease swelling in your nose and sinuses.  Topical nasal corticosteroids. These are nasal sprays that ease inflammation and swelling in your nose and sinuses.  Nasal saline washes. These rinses can help  to get rid of thick mucus in your nose.  If your condition is caused by bacteria, you will be given an antibiotic medicine. If your condition is caused by a fungus, you will be given an antifungal medicine. Surgery may be needed to correct underlying conditions, such as narrow nasal passages. Surgery may also be needed to remove polyps. Follow these instructions at home: Medicines  Take, use, or apply over-the-counter and prescription medicines only as told by your health care provider. These may include nasal sprays.  If you were prescribed an antibiotic medicine, take it as told by your health care provider. Do not stop taking the antibiotic even if you start to feel better. Hydrate and Humidify  Drink enough water to keep your urine clear or pale yellow. Staying hydrated will help to thin your mucus.  Use a cool mist humidifier to keep the humidity level in your home above 50%.  Inhale steam for 10-15 minutes, 3-4 times a day or as told by your health care provider. You can do this in the bathroom while a hot shower is running.  Limit your exposure to cool or dry air. Rest  Rest as much as possible.  Sleep with your head raised (elevated).  Make sure to get enough sleep each night. General instructions  Apply a warm, moist washcloth to your face 3-4 times a day or as told by your health care provider. This will help with discomfort.  Wash your hands often with soap and water to reduce your exposure to viruses and other germs. If soap and water are not available, use hand sanitizer.  Do not smoke. Avoid being around people who are smoking (secondhand smoke).  Keep all follow-up visits as told by your health care provider. This is important. Contact a health care provider if:  You have a fever.  Your symptoms get worse.  Your symptoms do not improve within 10 days. Get help right away if:  You have a severe headache.  You have persistent vomiting.  You have pain or  swelling around your face or eyes.  You have vision problems.  You develop confusion.  Your neck is stiff.  You have trouble breathing. This information is not intended to replace advice given to you by your health care provider. Make sure you discuss any questions you have with your health care provider. Document Released: 07/17/2005 Document Revised: 03/12/2016 Document Reviewed: 05/12/2015 Elsevier Interactive Patient Education  [employer].

## 2017-11-13 ENCOUNTER — Other Ambulatory Visit: Payer: Self-pay | Admitting: Radiology

## 2017-11-13 ENCOUNTER — Other Ambulatory Visit: Payer: Self-pay | Admitting: Hematology & Oncology

## 2017-11-13 DIAGNOSIS — D500 Iron deficiency anemia secondary to blood loss (chronic): Secondary | ICD-10-CM

## 2017-11-13 DIAGNOSIS — D473 Essential (hemorrhagic) thrombocythemia: Secondary | ICD-10-CM

## 2017-11-16 ENCOUNTER — Ambulatory Visit (HOSPITAL_COMMUNITY)
Admission: RE | Admit: 2017-11-16 | Discharge: 2017-11-16 | Disposition: A | Discharge status: Home / Self Care | Payer: Medicare Other | Source: Ambulatory Visit | Attending: Family | Admitting: Family

## 2017-11-16 ENCOUNTER — Encounter (HOSPITAL_COMMUNITY): Payer: Self-pay

## 2017-11-16 DIAGNOSIS — K219 Gastro-esophageal reflux disease without esophagitis: Secondary | ICD-10-CM | POA: Diagnosis not present

## 2017-11-16 DIAGNOSIS — Z8042 Family history of malignant neoplasm of prostate: Secondary | ICD-10-CM | POA: Insufficient documentation

## 2017-11-16 DIAGNOSIS — Z7951 Long term (current) use of inhaled steroids: Secondary | ICD-10-CM | POA: Diagnosis not present

## 2017-11-16 DIAGNOSIS — R7989 Other specified abnormal findings of blood chemistry: Secondary | ICD-10-CM | POA: Insufficient documentation

## 2017-11-16 DIAGNOSIS — Z9889 Other specified postprocedural states: Secondary | ICD-10-CM | POA: Diagnosis not present

## 2017-11-16 DIAGNOSIS — Z90710 Acquired absence of both cervix and uterus: Secondary | ICD-10-CM | POA: Insufficient documentation

## 2017-11-16 DIAGNOSIS — D473 Essential (hemorrhagic) thrombocythemia: Secondary | ICD-10-CM | POA: Insufficient documentation

## 2017-11-16 DIAGNOSIS — Z8261 Family history of arthritis: Secondary | ICD-10-CM | POA: Insufficient documentation

## 2017-11-16 DIAGNOSIS — Z820 Family history of epilepsy and other diseases of the nervous system: Secondary | ICD-10-CM | POA: Insufficient documentation

## 2017-11-16 DIAGNOSIS — Z836 Family history of other diseases of the respiratory system: Secondary | ICD-10-CM | POA: Insufficient documentation

## 2017-11-16 DIAGNOSIS — D539 Nutritional anemia, unspecified: Secondary | ICD-10-CM | POA: Diagnosis not present

## 2017-11-16 DIAGNOSIS — Z823 Family history of stroke: Secondary | ICD-10-CM | POA: Insufficient documentation

## 2017-11-16 DIAGNOSIS — I10 Essential (primary) hypertension: Secondary | ICD-10-CM | POA: Diagnosis not present

## 2017-11-16 DIAGNOSIS — D474 Osteomyelofibrosis: Secondary | ICD-10-CM | POA: Diagnosis not present

## 2017-11-16 DIAGNOSIS — E785 Hyperlipidemia, unspecified: Secondary | ICD-10-CM | POA: Insufficient documentation

## 2017-11-16 DIAGNOSIS — Z8249 Family history of ischemic heart disease and other diseases of the circulatory system: Secondary | ICD-10-CM | POA: Insufficient documentation

## 2017-11-16 DIAGNOSIS — Z79899 Other long term (current) drug therapy: Secondary | ICD-10-CM | POA: Diagnosis not present

## 2017-11-16 DIAGNOSIS — G2581 Restless legs syndrome: Secondary | ICD-10-CM | POA: Insufficient documentation

## 2017-11-16 DIAGNOSIS — Z800 Family history of malignant neoplasm of digestive organs: Secondary | ICD-10-CM | POA: Insufficient documentation

## 2017-11-16 DIAGNOSIS — Z801 Family history of malignant neoplasm of trachea, bronchus and lung: Secondary | ICD-10-CM | POA: Insufficient documentation

## 2017-11-16 DIAGNOSIS — Z8041 Family history of malignant neoplasm of ovary: Secondary | ICD-10-CM | POA: Insufficient documentation

## 2017-11-16 DIAGNOSIS — E079 Disorder of thyroid, unspecified: Secondary | ICD-10-CM | POA: Diagnosis not present

## 2017-11-16 DIAGNOSIS — F418 Other specified anxiety disorders: Secondary | ICD-10-CM | POA: Diagnosis not present

## 2017-11-16 DIAGNOSIS — D509 Iron deficiency anemia, unspecified: Secondary | ICD-10-CM | POA: Insufficient documentation

## 2017-11-16 DIAGNOSIS — Z8619 Personal history of other infectious and parasitic diseases: Secondary | ICD-10-CM | POA: Diagnosis not present

## 2017-11-16 DIAGNOSIS — Z7982 Long term (current) use of aspirin: Secondary | ICD-10-CM | POA: Insufficient documentation

## 2017-11-16 DIAGNOSIS — D696 Thrombocytopenia, unspecified: Secondary | ICD-10-CM | POA: Diagnosis not present

## 2017-11-16 DIAGNOSIS — D7589 Other specified diseases of blood and blood-forming organs: Secondary | ICD-10-CM | POA: Diagnosis not present

## 2017-11-16 DIAGNOSIS — M1990 Unspecified osteoarthritis, unspecified site: Secondary | ICD-10-CM | POA: Diagnosis not present

## 2017-11-16 DIAGNOSIS — Z8379 Family history of other diseases of the digestive system: Secondary | ICD-10-CM | POA: Insufficient documentation

## 2017-11-16 DIAGNOSIS — Z79890 Hormone replacement therapy (postmenopausal): Secondary | ICD-10-CM | POA: Insufficient documentation

## 2017-11-16 DIAGNOSIS — Z87891 Personal history of nicotine dependence: Secondary | ICD-10-CM | POA: Insufficient documentation

## 2017-11-16 LAB — CBC WITH DIFFERENTIAL/PLATELET
Basophils Absolute: 0.1 10*3/uL (ref 0.0–0.1)
Basophils Relative: 1 %
Eosinophils Absolute: 0.4 10*3/uL (ref 0.0–0.7)
Eosinophils Relative: 6 %
HCT: 36.7 % (ref 36.0–46.0)
Hemoglobin: 11.9 g/dL — ABNORMAL LOW (ref 12.0–15.0)
Lymphocytes Relative: 30 %
Lymphs Abs: 1.7 10*3/uL (ref 0.7–4.0)
MCH: 33.6 pg (ref 26.0–34.0)
MCHC: 32.4 g/dL (ref 30.0–36.0)
MCV: 103.7 fL — ABNORMAL HIGH (ref 78.0–100.0)
Monocytes Absolute: 0.2 10*3/uL (ref 0.1–1.0)
Monocytes Relative: 4 %
Neutro Abs: 3.2 10*3/uL (ref 1.7–7.7)
Neutrophils Relative %: 59 %
Platelets: 1120 10*3/uL (ref 150–400)
RBC: 3.54 MIL/uL — ABNORMAL LOW (ref 3.87–5.11)
RDW: 13.6 % (ref 11.5–15.5)
WBC: 5.5 10*3/uL (ref 4.0–10.5)

## 2017-11-16 LAB — PROTIME-INR
INR: 0.95
Prothrombin Time: 12.6 seconds (ref 11.4–15.2)

## 2017-11-16 MED ORDER — FENTANYL CITRATE (PF) 100 MCG/2ML IJ SOLN
INTRAMUSCULAR | Status: AC | PRN
  Administered 2017-11-16 (×2): 50 ug via INTRAVENOUS

## 2017-11-16 MED ORDER — SODIUM CHLORIDE 0.9 % IV SOLN
INTRAVENOUS | Status: DC
  Administered 2017-11-16: 08:00:00 via INTRAVENOUS

## 2017-11-16 MED ORDER — LIDOCAINE HCL 1 % IJ SOLN
INTRAMUSCULAR | Status: AC | PRN
  Administered 2017-11-16: 10 mL via INTRADERMAL

## 2017-11-16 MED ORDER — MIDAZOLAM HCL 2 MG/2ML IJ SOLN
INTRAMUSCULAR | Status: AC | PRN
  Administered 2017-11-16 (×2): 1 mg via INTRAVENOUS

## 2017-11-16 MED ORDER — FENTANYL CITRATE (PF) 100 MCG/2ML IJ SOLN
INTRAMUSCULAR | Status: AC
  Filled 2017-11-16: qty 4

## 2017-11-16 MED ORDER — MIDAZOLAM HCL 2 MG/2ML IJ SOLN
INTRAMUSCULAR | Status: AC
  Filled 2017-11-16: qty 4

## 2017-11-16 NOTE — Procedures (Signed)
  Procedure: CT bone marrow biopsy R iliac EBL:   minimal Complications:  none immediate  See full dictation in [facility].  Dillard Cannon MD Main # [phone] Pager  [phone]

## 2017-11-16 NOTE — Consult Note (Addendum)
Chief Complaint: Patient was seen in consultation today for CT-guided bone marrow biopsy  Referring Physician(s): Ennever,P  Supervising Physician: Arne Cleveland  Patient Status: Rachael Johnson - Out-pt  History of Present Illness: Rachael Johnson is a 67 y.o. female, ex- smoker,  with history of iron deficiency anemia as well as essential thrombocythemia who presents today for CT guided bone marrow biopsy for further evaluation.   Past Medical History:  Diagnosis Date  . Acute renal insufficiency 04/09/2017  . Anemia, iron deficiency 07/08/2012  . Arthritis   . Cataract 12/21/2014  . Depression with anxiety 03/24/2011  . Diverticulitis   . Esophageal reflux 08/17/2013  . Essential thrombocythemia ([place]) 01/24/2011  . Frequent episodic tension-type headache   . Gastroenteritis 12/21/2014  . 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 GERD (gastroesophageal reflux disease)   . H. pylori infection 12/19/2012  . Hyperglycemia 12/17/2015  . Hypertension   . Iron deficiency anemia due to chronic blood loss 04/03/2017  . Osteoarthritis 05/24/2017  . Other and unspecified hyperlipidemia 02/25/2013  . Restless leg syndrome 09/30/2014  . Rhabdomyolysis 02/2017  . Scabies 03/28/2015  . Seizure (Rachael Johnson)    childhood  . Shoulder wound, right, sequela 03/14/2017  . Thyroid disease     Past Surgical History:  Procedure Laterality Date  . ABDOMINAL HYSTERECTOMY     1992  . BREAST SURGERY  1992   biopsy, benign. Fibrocystic  . UMBILICAL HERNIA REPAIR N/A 09/25/2012   Procedure: HERNIA REPAIR UMBILICAL ADULT;  Surgeon: Harl Bowie, MD;  Location: [facility];  Service: General;  Laterality: N/A;    Allergies: Patient has no known allergies.  Medications: Prior to Admission medications   Medication Sig Start Date End Date Taking? Authorizing Provider  albuterol (VENTOLIN HFA) 108 (90 Base) MCG/ACT inhaler INHALE 2 PUFFS INTO THE LUNGS EVERY 6 (SIX) HOURS AS NEEDED FOR WHEEZING OR SHORTNESS OF BREATH.  Can use  Ventolin, ProAir or Proventil whichever cheaper 12/21/16  Yes Mosie Lukes, MD  ALPRAZolam Duanne Moron) 1 MG tablet TAKE 1 TABLET 3 TIMES A DAY AS NEEDED FOR ANIXETY 11/01/17  Yes Mosie Lukes, MD  amoxicillin-clavulanate (AUGMENTIN) 875-125 MG tablet Take 1 tablet by mouth 2 (two) times daily. 11/05/17  Yes Mosie Lukes, MD  anagrelide (AGRYLIN) 1 MG capsule Take 1 capsule (1 mg total) by mouth 3 (three) times daily. 10/30/17  Yes [place], Holli Humbles, NP  aspirin EC 81 MG tablet Take 81 mg by mouth every morning.   Yes [provider]  budesonide-formoterol (SYMBICORT) 80-4.5 MCG/ACT inhaler Inhale 2 puffs into the lungs 2 (two) times daily. 04/04/17  Yes Parrett, Tammy S, NP  busPIRone (BUSPAR) 10 MG tablet Take 1 tablet (10 mg total) by mouth 3 (three) times daily. 03/23/17  Yes Ritta Slot, NP  cetirizine (ZYRTEC) 10 MG tablet Take 1 tablet (10 mg total) by mouth daily. 11/05/17  Yes Mosie Lukes, MD  chlorpheniramine (CHLOR-TRIMETON) 4 MG tablet Take 8 mg by mouth at bedtime.   Yes [provider]  collagenase (SANTYL) ointment Apply topically daily. 03/24/17  Yes Ritta Slot, NP  Cranberry 300 MG tablet Take 300 mg by mouth 2 (two) times daily.   Yes [provider]  cyclobenzaprine (FLEXERIL) 10 MG tablet TAKE 1 TABLET (10 MG TOTAL) BY MOUTH 2 (TWO) TIMES DAILY AS NEEDED FOR MUSCLE SPASMS. 10/09/17  Yes Mosie Lukes, MD  estradiol (ESTRACE) 0.5 MG tablet TAKE 1 TABLET (0.5 MG TOTAL) BY MOUTH 2 (TWO) TIMES  DAILY. 09/14/17  Yes Mosie Lukes, MD  fluticasone (FLONASE) 50 MCG/ACT nasal spray Place 2 sprays into both nostrils daily. 12/21/16  Yes Mosie Lukes, MD  furosemide (LASIX) 20 MG tablet TAKE 1 TABLET (20 MG TOTAL) BY MOUTH 2 (TWO) TIMES DAILY AS NEEDED FOR FLUID OR EDEMA. 10/09/17  Yes Mosie Lukes, MD  HYDROcodone-acetaminophen (NORCO) 7.5-325 MG tablet Take 1 tablet by mouth every 6 (six) hours as needed for moderate pain. 11/05/17  Yes Mosie Lukes, MD    lactulose (CHRONULAC) 10 GM/15ML solution TAKE 2 TABLESPOONFULS (30 ML) BY MOUTH 3 TIMES A DAY FOR CONSTIPATION 02/12/17  Yes Mosie Lukes, MD  levothyroxine (SYNTHROID, LEVOTHROID) 112 MCG tablet Take 1 tablet (112 mcg total) by mouth daily. 08/17/17  Yes Mosie Lukes, MD  meclizine (ANTIVERT) 12.5 MG tablet TAKE 1 TABLET THREE TIMES A DAY AS NEEDED FOR NAUSEA OR DIZZINESS 02/12/17  Yes Mosie Lukes, MD  montelukast (SINGULAIR) 10 MG tablet Take 1 tablet (10 mg total) by mouth at bedtime. 02/12/17  Yes Mosie Lukes, MD  Multiple Vitamin (MULTIVITAMIN PO) Take 1 tablet by mouth every morning.    Yes [provider]  pramipexole (MIRAPEX) 0.25 MG tablet TAKE ONE OR TWO TABLETS BY MOUTH AT BEDTIME AS NEEDED 05/11/17  Yes [provider]  ranitidine (ZANTAC) 300 MG tablet Take 1 tablet (300 mg total) by mouth at bedtime. 02/12/17  Yes Mosie Lukes, MD  RESTASIS 0.05 % ophthalmic emulsion PLACE ONE DROP INTO EACH EYE EVERY 12 HOURS 05/09/17  Yes [provider]  Suvorexant (BELSOMRA) 15 MG TABS Take 15 mg by mouth at bedtime as needed. 06/25/17  Yes [name], MD  venlafaxine XR (EFFEXOR-XR) 150 MG 24 hr capsule TAKE 1 CAPSULE (150 MG TOTAL) BY MOUTH DAILY WITH BREAKFAST. 09/24/17  Yes Mosie Lukes, MD  anagrelide (AGRYLIN) 1 MG capsule TAKE 1 CAPSULE (1 MG TOTAL) BY MOUTH 2 (TWO) TIMES DAILY. 11/13/17   [name], MD  Vitamin D, Ergocalciferol, (DRISDOL) 50000 units CAPS capsule Take 1 capsule (50,000 Units total) by mouth every 7 (seven) days. 02/12/17   Mosie Lukes, MD     Family History  Problem Relation Age of Onset  . Arthritis Mother   . Cancer Mother        ovarian  . Hypertension Mother   . Heart disease Mother        pacer  . Heart failure Mother   . Arthritis Father   . Cancer Father        lung  . Hypertension Father   . Cancer Sister        lung  . Cancer Brother        prostate  . Cancer Brother        lung  .  Hypertension Son   . COPD Brother   . Heart disease Brother        died from CHF  . Hypertension Brother   . Cancer Brother        colon  . Alcohol abuse Brother   . Cirrhosis Brother   . Seizures Sister   . Stroke Sister   . Arthritis Brother     Social History   Socioeconomic History  . Marital status: Widowed    Spouse name: Not on file  . Number of children: 2  . Years of education: Not on file  . Highest education level: Not on file  Occupational History  . Occupation: APL IT sales professional: [employer]    Comment: [place]  . Financial resource strain: Not on file  . Food insecurity:    Worry: Not on file    Inability: Not on file  . Transportation needs:    Medical: Not on file    Non-medical: Not on file  Tobacco Use  . Smoking status: Former Smoker    Packs/day: 3.50    Years: 20.00    Pack years: 70.00    Types: Cigarettes    Start date: 11/15/1970    Last attempt to quit: 07/31/1990    Years since quitting: 27.3  . Smokeless tobacco: Never Used  . Tobacco comment: quit 23 years ago  Substance and Sexual Activity  . Alcohol use: No    Alcohol/week: 0.0 oz  . Drug use: No  . Sexual activity: Never  Lifestyle  . Physical activity:    Days per week: Not on file    Minutes per session: Not on file  . Stress: Not on file  Relationships  . Social connections:    Talks on phone: Not on file    Gets together: Not on file    Attends religious service: Not on file    Active member of club or organization: Not on file    Attends meetings of clubs or organizations: Not on file    Relationship status: Not on file  Other Topics Concern  . Not on file  Social History Narrative   Regular exercise: no   Caffeine Use: 2-3 weekly    ECOG Status:   Review of Systems denies fever, chest pain, or abnormal bleeding.  She does have fatigue, occasional headaches, some dyspnea with exertion, occasional cough, intermittent abdominal and back  discomfort as well as occasional nausea and vomiting.  Vital Signs: BP (!) 149/79   Pulse 75   Temp (!) 97.4 F (36.3 C) (Oral)   Resp 18   Ht 5' 3.5" (1.613 m)   Wt 143 lb (64.9 kg)   LMP 07/31/1990   SpO2 98%   BMI 24.93 kg/m   Physical Exam awake, alert.  Chest with distant breath sounds bilaterally.  Heart with regular rate and rhythm.  Abdomen soft, positive bowel sounds, nontender.  Right posterior shoulder wound noted.  Imaging: No results found.  Labs:  CBC: Recent Labs    06/25/17 1122 07/16/17 1057 08/10/17 1110  08/28/17 1259 09/25/17 1053 10/30/17 0959 11/16/17 0715  WBC 4.3 3.9 5.0   < > 3.9 5.2 7.0 5.5  HGB 10.2* 11.7 13.4  --   --   --   --  11.9*  HCT 31.8* 36.1 40.9   < > 38.0 36.6 37.0 36.7  PLT 874* 935* 1018.0 Critical result called to [place] on 08/10/2017 3:09 PM by Scales, Hope. Results were read back to caller.*   < > 759* 889* 932* 1,120*   < > = values in this interval not displayed.    COAGS: Recent Labs    03/13/17 0016 11/16/17 0715  INR 1.23 0.95  APTT 29  --     BMP: Recent Labs    03/23/17 0626  08/10/17 1110 08/28/17 1259 09/25/17 1050 10/30/17 0959  NA 137   < > 136 139 140 140  K 3.4*   < > 4.0 3.5 4.0 3.7  CL 98*   < > 99 102 103 110*  CO2 27   < > 32 30*  28 22  GLUCOSE 92   < > 85 81 86 89  BUN 66*   < > '13 10 11 17  ' CALCIUM 8.5*   < > 9.3 8.8 9.3 9.1  CREATININE 4.49*   < > 0.72 0.83 0.76 0.78  GFRNONAA 9*  --   --  >60 >60 >60  GFRAA 11*  --   --  >60 >60 >60   < > = values in this interval not displayed.    LIVER FUNCTION TESTS: Recent Labs    08/10/17 1110 08/28/17 1259 09/25/17 1050 10/30/17 0959  BILITOT 0.2 <0.2* <0.2* <0.2*  AST '18 21 20 23  ' ALT '10 8 10 13  ' ALKPHOS 71 84 100 80  PROT 7.7 7.6 7.4 7.3  ALBUMIN 4.2 4.0 3.4* 3.7    TUMOR MARKERS: No results for input(s): AFPTM, CEA, CA199, CHROMGRNA in the last 8760 hours.  Assessment and Plan: 67 y.o. female with history of iron deficiency  anemia as well as essential thrombocythemia  (plt count 1120k today)who presents today for CT guided bone marrow biopsy for further evaluation.Risks and benefits discussed with the patient/son including, but not limited to bleeding, infection, damage to adjacent structures or low yield requiring additional tests.  All of the patient's questions were answered, patient is agreeable to proceed. Consent signed and in chart.  Dr. Antonieta Pert office  notified of pt's current platelet count   Thank you for this interesting consult.  I greatly enjoyed meeting KIHANNA KAMIYA and look forward to participating in their care.  A copy of this report was sent to the requesting provider on this date.  Electronically Signed: D. Rowe Robert, PA-C 11/16/2017, 8:24 AM   I spent a total of 20 minutes  in face to face in clinical consultation, greater than 50% of which was counseling/coordinating care for CT-guided bone marrow biopsy

## 2017-11-16 NOTE — Discharge Instructions (Signed)

## 2017-11-16 NOTE — Progress Notes (Signed)
CRITICAL VALUE ALERT  Critical Value:  Platelet count 1120  Date & Time Notied:  11/16/17 0815  Provider Notified: Rowe Robert PA  Orders Received/Actions taken: Patient is here for bone marrow biopsy to diagnose reason for high platelet count

## 2017-11-18 ENCOUNTER — Other Ambulatory Visit: Payer: Self-pay | Admitting: Family Medicine

## 2017-11-19 DIAGNOSIS — H2513 Age-related nuclear cataract, bilateral: Secondary | ICD-10-CM | POA: Diagnosis not present

## 2017-11-19 DIAGNOSIS — H04123 Dry eye syndrome of bilateral lacrimal glands: Secondary | ICD-10-CM | POA: Diagnosis not present

## 2017-11-28 ENCOUNTER — Other Ambulatory Visit: Payer: Self-pay | Admitting: Family Medicine

## 2017-11-28 DIAGNOSIS — E559 Vitamin D deficiency, unspecified: Secondary | ICD-10-CM

## 2017-11-28 DIAGNOSIS — I10 Essential (primary) hypertension: Secondary | ICD-10-CM

## 2017-11-28 DIAGNOSIS — R300 Dysuria: Secondary | ICD-10-CM

## 2017-11-28 DIAGNOSIS — R739 Hyperglycemia, unspecified: Secondary | ICD-10-CM

## 2017-11-29 ENCOUNTER — Inpatient Hospital Stay: Payer: Medicare Other | Attending: Hematology & Oncology | Admitting: Hematology & Oncology

## 2017-11-29 ENCOUNTER — Encounter: Payer: Self-pay | Admitting: Hematology & Oncology

## 2017-11-29 ENCOUNTER — Telehealth: Payer: Self-pay | Admitting: Family Medicine

## 2017-11-29 ENCOUNTER — Inpatient Hospital Stay: Payer: Medicare Other

## 2017-11-29 ENCOUNTER — Other Ambulatory Visit: Payer: Self-pay

## 2017-11-29 ENCOUNTER — Telehealth: Payer: Self-pay | Admitting: Pharmacist

## 2017-11-29 ENCOUNTER — Encounter (HOSPITAL_BASED_OUTPATIENT_CLINIC_OR_DEPARTMENT_OTHER): Payer: Self-pay

## 2017-11-29 ENCOUNTER — Ambulatory Visit (HOSPITAL_BASED_OUTPATIENT_CLINIC_OR_DEPARTMENT_OTHER): Payer: Medicare Other

## 2017-11-29 ENCOUNTER — Other Ambulatory Visit: Payer: Self-pay | Admitting: *Deleted

## 2017-11-29 ENCOUNTER — Ambulatory Visit (HOSPITAL_BASED_OUTPATIENT_CLINIC_OR_DEPARTMENT_OTHER)
Admission: RE | Admit: 2017-11-29 | Discharge: 2017-11-29 | Disposition: A | Discharge status: Home / Self Care | Payer: Medicare Other | Source: Ambulatory Visit | Attending: Family Medicine | Admitting: Family Medicine

## 2017-11-29 VITALS — BP 126/64 | HR 86 | Temp 97.6°F | Resp 18 | Wt 147.0 lb

## 2017-11-29 DIAGNOSIS — Z1231 Encounter for screening mammogram for malignant neoplasm of breast: Secondary | ICD-10-CM | POA: Diagnosis not present

## 2017-11-29 DIAGNOSIS — D500 Iron deficiency anemia secondary to blood loss (chronic): Secondary | ICD-10-CM | POA: Diagnosis not present

## 2017-11-29 DIAGNOSIS — D509 Iron deficiency anemia, unspecified: Secondary | ICD-10-CM | POA: Diagnosis not present

## 2017-11-29 DIAGNOSIS — Z79899 Other long term (current) drug therapy: Secondary | ICD-10-CM | POA: Diagnosis not present

## 2017-11-29 DIAGNOSIS — Z1239 Encounter for other screening for malignant neoplasm of breast: Secondary | ICD-10-CM

## 2017-11-29 DIAGNOSIS — D7581 Myelofibrosis: Secondary | ICD-10-CM | POA: Insufficient documentation

## 2017-11-29 DIAGNOSIS — D473 Essential (hemorrhagic) thrombocythemia: Secondary | ICD-10-CM | POA: Insufficient documentation

## 2017-11-29 DIAGNOSIS — E032 Hypothyroidism due to medicaments and other exogenous substances: Secondary | ICD-10-CM | POA: Diagnosis not present

## 2017-11-29 DIAGNOSIS — Z7982 Long term (current) use of aspirin: Secondary | ICD-10-CM | POA: Diagnosis not present

## 2017-11-29 HISTORY — DX: Myelofibrosis: D75.81

## 2017-11-29 LAB — CMP (CANCER CENTER ONLY)
ALT: 35 U/L (ref 10–47)
AST: 31 U/L (ref 11–38)
Albumin: 3.9 g/dL (ref 3.5–5.0)
Alkaline Phosphatase: 91 U/L — ABNORMAL HIGH (ref 26–84)
Anion gap: 7 (ref 5–15)
BUN: 15 mg/dL (ref 7–22)
CO2: 33 mmol/L (ref 18–33)
Calcium: 9 mg/dL (ref 8.0–10.3)
Chloride: 101 mmol/L (ref 98–108)
Creatinine: 1 mg/dL (ref 0.60–1.20)
Glucose, Bld: 94 mg/dL (ref 73–118)
Potassium: 3.7 mmol/L (ref 3.3–4.7)
Sodium: 141 mmol/L (ref 128–145)
Total Bilirubin: 0.6 mg/dL (ref 0.2–1.6)
Total Protein: 7.8 g/dL (ref 6.4–8.1)

## 2017-11-29 LAB — CBC WITH DIFFERENTIAL (CANCER CENTER ONLY)
Basophils Absolute: 0.1 10*3/uL (ref 0.0–0.1)
Basophils Relative: 1 %
Eosinophils Absolute: 0.3 10*3/uL (ref 0.0–0.5)
Eosinophils Relative: 5 %
HCT: 36.8 % (ref 34.8–46.6)
Hemoglobin: 12 g/dL (ref 11.6–15.9)
Lymphocytes Relative: 34 %
Lymphs Abs: 2.1 10*3/uL (ref 0.9–3.3)
MCH: 34 pg (ref 26.0–34.0)
MCHC: 32.6 g/dL (ref 32.0–36.0)
MCV: 104.2 fL — ABNORMAL HIGH (ref 81.0–101.0)
Monocytes Absolute: 0.5 10*3/uL (ref 0.1–0.9)
Monocytes Relative: 8 %
Neutro Abs: 3.2 10*3/uL (ref 1.5–6.5)
Neutrophils Relative %: 52 %
Platelet Count: 1039 10*3/uL — ABNORMAL HIGH (ref 145–400)
RBC: 3.53 MIL/uL — ABNORMAL LOW (ref 3.70–5.32)
RDW: 13.1 % (ref 11.1–15.7)
WBC Count: 6.2 10*3/uL (ref 3.9–10.0)

## 2017-11-29 LAB — IRON AND TIBC
Iron: 89 ug/dL (ref 41–142)
Saturation Ratios: 40 % (ref 21–57)
TIBC: 225 ug/dL — ABNORMAL LOW (ref 236–444)
UIBC: 136 ug/dL

## 2017-11-29 LAB — RETICULOCYTES
RBC.: 3.5 MIL/uL — ABNORMAL LOW (ref 3.70–5.45)
Retic Count, Absolute: 56 10*3/uL (ref 33.7–90.7)
Retic Ct Pct: 1.6 % (ref 0.7–2.1)

## 2017-11-29 LAB — FERRITIN: Ferritin: 792 ng/mL — ABNORMAL HIGH (ref 9–269)

## 2017-11-29 MED ORDER — RUXOLITINIB PHOSPHATE 20 MG PO TABS: 20.0000 mg | ORAL_TABLET | Freq: Two times a day (BID) | ORAL | 4 refills | Status: DC

## 2017-11-29 NOTE — Progress Notes (Signed)
Hematology and Oncology Follow Up Visit  Rachael Johnson [id number] 01/23/1951 67 y.o. 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  Unfortunately, we are running into more difficulties with respect to her disease.  Her platelet count keeps rising.  I have her on anagrelide.  I had her on Hydrea.  I went ahead and got a bone marrow biopsy done on her.  This was on 11/16/2017.  The bone marrow pathology report (([id number] showed a normocellular marrow.  She had marked megakaryocytic increase.  She had some areas of fibrosis.  As such, I suspect that she might be transforming over to a post-ET myelofibrosis.  As such, I think we need to try Jakafi.  I realize that she is Jak 2-.  However, I believe that Rachael Johnson be able to help her out.  I know that she is quite sensitive to medications.  I will not use full dose Jakafi.  I will try her on 20 mg p.o. twice daily dosing.    She is doing her best.  She just does not feel all that well.  This certainly might be from the beginning of her marrow transformation.  She is complaining of some bony pain.  She has some achiness in her bones.  She is having some sweats.  She does have some element of splenomegaly.  We have done iron studies on her.  The iron studies have been doing okay.  We have been treating her now for over 10 years.  She is really managed to do well until recently.  Her appetite is down a little bit.  She says that she just does not have much of an appetite.  She  said that she gets full more easily.  Overall, she is always too many medications.  I went through her medications with her today.  We are trying to cut back on some of her medicines.  Currently, her performance status is ECOG 1.  Medications:  Allergies as of 11/29/2017   No Known Allergies     Medication List        Accurate as of 11/29/17 12:36 PM. Always use your most recent med list.          albuterol 108 (90 Base) MCG/ACT inhaler Commonly known as:  VENTOLIN HFA INHALE 2 PUFFS INTO THE LUNGS EVERY 6 (SIX) HOURS AS NEEDED FOR WHEEZING OR SHORTNESS OF BREATH.  Can use Ventolin, ProAir or Proventil whichever cheaper   ALPRAZolam 1 MG tablet Commonly known as:  XANAX TAKE 1 TABLET 3 TIMES A DAY AS NEEDED FOR ANIXETY   anagrelide 1 MG capsule Commonly known as:  AGRYLIN Take 1 capsule (1 mg total) by mouth 3 (three) times daily.   aspirin EC 81 MG tablet Take 81 mg by mouth every morning.   budesonide-formoterol 80-4.5 MCG/ACT inhaler Commonly known as:  SYMBICORT Inhale 2 puffs into the lungs 2 (two) times daily.   busPIRone 10 MG tablet Commonly known as:  BUSPAR Take 1 tablet (10 mg total) by mouth 3 (  three) times daily.   cetirizine 10 MG tablet Commonly known as:  ZYRTEC Take 1 tablet (10 mg total) by mouth daily.   chlorpheniramine 4 MG tablet Commonly known as:  CHLOR-TRIMETON Take 8 mg by mouth at bedtime.   collagenase ointment Commonly known as:  SANTYL Apply topically daily.   Cranberry 300 MG tablet Take 300 mg by mouth 2 (two) times daily.   cyclobenzaprine 10 MG tablet Commonly known as:  FLEXERIL TAKE 1 TABLET (10 MG TOTAL) BY MOUTH 2 (TWO) TIMES DAILY AS NEEDED FOR MUSCLE SPASMS.   estradiol 0.5 MG tablet Commonly known as:  ESTRACE TAKE 1 TABLET (0.5 MG TOTAL) BY MOUTH 2 (TWO) TIMES DAILY.   fluticasone 50 MCG/ACT nasal spray Commonly known as:  FLONASE Place 2 sprays into both nostrils daily.   furosemide 20 MG tablet Commonly  known as:  LASIX TAKE 1 TABLET (20 MG TOTAL) BY MOUTH 2 (TWO) TIMES DAILY AS NEEDED FOR FLUID OR EDEMA.   HYDROcodone-acetaminophen 7.5-325 MG tablet Commonly known as:  NORCO Take 1 tablet by mouth every 6 (six) hours as needed for moderate pain.   lactulose 10 GM/15ML solution Commonly known as:  CHRONULAC TAKE 2 TABLESPOONFULS (30 ML) BY MOUTH 3 TIMES A DAY FOR CONSTIPATION   levothyroxine 112 MCG tablet Commonly known as:  SYNTHROID, LEVOTHROID Take 1 tablet (112 mcg total) by mouth daily.   meclizine 12.5 MG tablet Commonly known as:  ANTIVERT TAKE 1 TABLET THREE TIMES A DAY AS NEEDED FOR NAUSEA OR DIZZINESS   montelukast 10 MG tablet Commonly known as:  SINGULAIR TAKE 1 TABLET (10 MG TOTAL) BY MOUTH AT BEDTIME.   MULTIVITAMIN PO Take 1 tablet by mouth every morning.   pramipexole 0.25 MG tablet Commonly known as:  MIRAPEX TAKE ONE OR TWO TABLETS BY MOUTH AT BEDTIME AS NEEDED   ranitidine 300 MG tablet Commonly known as:  ZANTAC Take 1 tablet (300 mg total) by mouth at bedtime.   RESTASIS 0.05 % ophthalmic emulsion Generic drug:  cycloSPORINE PLACE ONE DROP INTO EACH EYE EVERY 12 HOURS   Suvorexant 15 MG Tabs Commonly known as:  BELSOMRA Take 15 mg by mouth at bedtime as needed.   venlafaxine XR 150 MG 24 hr capsule Commonly known as:  EFFEXOR-XR TAKE 1 CAPSULE (150 MG TOTAL) BY MOUTH DAILY WITH BREAKFAST.   Vitamin D (Ergocalciferol) 50000 units Caps capsule Commonly known as:  DRISDOL Take 1 capsule (50,000 Units total) by mouth every 7 (seven) days.       Allergies: No Known Allergies  Past Medical History, Surgical history, Social history, and Family History were reviewed and updated.  Review of Systems: Review of Systems  Constitutional: Positive for diaphoresis and malaise/fatigue.  HENT: Negative.   Eyes: Negative.   Respiratory: Positive for shortness of breath.   Cardiovascular: Negative.   Gastrointestinal: Positive for abdominal pain,  heartburn and nausea.  Genitourinary: Negative.   Musculoskeletal: Positive for joint pain and myalgias.  Skin: Negative.   Neurological: Positive for dizziness.  Endo/Heme/Allergies: Negative.   Psychiatric/Behavioral: Negative.      Physical Exam:  weight is 147 lb (66.7 kg). Her oral temperature is 97.6 F (36.4 C). Her blood pressure is 126/64 and her pulse is 86. Her respiration is 18.   Wt Readings from Last 3 Encounters:  11/29/17 147 lb (66.7 kg)  11/16/17 143 lb (64.9 kg)  11/05/17 143 lb 12.8 oz (65.2 kg)    Physical Exam  Constitutional: She is oriented to  person, place, and time.  HENT:  Head: Normocephalic and atraumatic.  Mouth/Throat: Oropharynx is clear and moist.  Eyes: Pupils are equal, round, and reactive to light. EOM are normal.  Neck: Normal range of motion.  Cardiovascular: Normal rate, regular rhythm and normal heart sounds.  Pulmonary/Chest: Effort normal and breath sounds normal.  Abdominal: Soft. Bowel sounds are normal.  Her spleen tip is palpable a couple centimeters below the left costal margin.  Musculoskeletal: Normal range of motion. She exhibits no edema, tenderness or deformity.  Lymphadenopathy:    She has no cervical adenopathy.  Neurological: She is alert and oriented to person, place, and time.  Skin: Skin is warm and dry. No rash noted. No erythema.  Psychiatric: She has a normal mood and affect. Her behavior is normal. Judgment and thought content normal.  Vitals reviewed.    Lab Results  Component Value Date   WBC 6.2 11/29/2017   HGB 12.0 11/29/2017   HCT 36.8 11/29/2017   MCV 104.2 (H) 11/29/2017   PLT 1,039 (H) 11/29/2017   Lab Results  Component Value Date   FERRITIN 631 (H) 10/30/2017   IRON 55 10/30/2017   TIBC 214 (L) 10/30/2017   UIBC 159 10/30/2017   IRONPCTSAT 26 10/30/2017   Lab Results  Component Value Date   RETICCTPCT 1.6 06/02/2015   RBC 3.53 (L) 11/29/2017   RETICCTABS 45.8 06/02/2015   No results  found for: KPAFRELGTCHN, LAMBDASER, KAPLAMBRATIO No results found for: IGGSERUM, IGA, IGMSERUM No results found for: Odetta Pink, SPEI   Chemistry      Component Value Date/Time   NA 141 11/29/2017 1106   NA 143 07/16/2017 1057   NA 140 11/29/2016 0936   K 3.7 11/29/2017 1106   K 3.2 (L) 07/16/2017 1057   K 4.2 11/29/2016 0936   CL 101 11/29/2017 1106   CL 104 07/16/2017 1057   CO2 33 11/29/2017 1106   CO2 25 07/16/2017 1057   CO2 27 11/29/2016 0936   BUN 15 11/29/2017 1106   BUN 10 07/16/2017 1057   BUN 18.3 11/29/2016 0936   CREATININE 1.00 11/29/2017 1106   CREATININE 0.8 07/16/2017 1057   CREATININE 0.8 11/29/2016 0936      Component Value Date/Time   CALCIUM 9.0 11/29/2017 1106   CALCIUM 8.9 07/16/2017 1057   CALCIUM 8.9 11/29/2016 0936   ALKPHOS 91 (H) 11/29/2017 1106   ALKPHOS 71 07/16/2017 1057   ALKPHOS 77 11/29/2016 0936   AST 31 11/29/2017 1106   AST 19 11/29/2016 0936   ALT 35 11/29/2017 1106   ALT 27 07/16/2017 1057   ALT 10 11/29/2016 0936   BILITOT 0.6 11/29/2017 1106   BILITOT <0.22 11/29/2016 0936      Impression and Plan: Ms. Hottel is a very pleasant 67 yo caucasian female with essential thrombocythemia.  By the bone marrow report that she just had done, I believe that she is beginning to transform over to a myelofibrosis.  This could certainly explain the marrow fibrosis and thrombocytosis and the palpable spleen.  Again, we will have to try Jakafi.  Hopefully, this will help.  Hopefully this will bring her platelet count down.  We will also to watch out for her being anemic.  She really is trying her best.  She has a lot of good support from her family.  We will have her come back in 3 weeks.  I spent about 40-45 minutes with her.  Over half  the time was spent face-to-face.  I was going over her bone marrow report.  I was going over the labs that she had done.  I was going over the side effects  of Jakafi.  She understands.    [name], MD 5/2/201912:36 PM

## 2017-11-29 NOTE — Telephone Encounter (Signed)
Patient made aware she needs to contact pharmacy.  She has 1 refill remaining on her Xanax.

## 2017-11-29 NOTE — Telephone Encounter (Signed)
Oral Oncology Pharmacist Encounter  Received new prescription for Jakafi (ruxolitinib) for the treatment of post-essential thrombocythemia myelofibrosis, planned duration until disease progression or unacceptable drug toxicity.  CBC from 11/29/17 assessed, no relevant lab abnormalities. Prescription dose and frequency assessed.   Current medication list in Epic reviewed, no DDIs with Jakafi identified.  Prescription has been e-scribed to the [facility] for benefits analysis and approval.  Oral Oncology Clinic will continue to follow for insurance authorization, copayment issues, initial counseling and start date.  Darl Pikes, PharmD, BCPS Hematology/Oncology Clinical Pharmacist [facility] [phone]  11/29/2017 3:21 PM

## 2017-11-29 NOTE — Telephone Encounter (Signed)
Copied from [place] [id number]. Topic: Quick Communication - Rx Refill/Question >> Nov 29, 2017  7:49 AM Scherrie Gerlach wrote: Medication: ALPRAZolam Duanne Moron) 1 MG tablet Has the patient contacted their pharmacy? no [id number] [place],  - [place] [phone] (Phone) [phone] (Fax)

## 2017-12-03 ENCOUNTER — Telehealth: Payer: Self-pay | Admitting: Pharmacy Technician

## 2017-12-03 NOTE — Telephone Encounter (Signed)
Oral Oncology Patient Advocate Encounter  Received notification from [facility] that prior authorization for Shanon Brow is required.  PA submitted on CoverMyMeds Key [id number] Status is pending  Oral Oncology Clinic will continue to follow.  Fabio Asa. Melynda Keller, [place] Patient [place] [phone] 12/03/2017 1:12 PM'

## 2017-12-04 ENCOUNTER — Telehealth: Payer: Self-pay | Admitting: Pharmacist

## 2017-12-04 ENCOUNTER — Encounter (HOSPITAL_COMMUNITY): Payer: Self-pay | Admitting: Hematology & Oncology

## 2017-12-04 NOTE — Telephone Encounter (Signed)
Oral Chemotherapy Pharmacist Encounter  Successfully enrolled patient for copayment assistance funds from [place] from the Myeloproliferative Neoplasms fund   Award amount: $7000 Effective dates: 12/04/17 - 12/05/2018  ID: [id number]  Patient Responsibility: $0.00  Rx BIN: [id number]  Rx PCN: [id number]  Group #: [id number]  Billing information will shared with [place]. I will place a copy of the award letter to be scanned into patient's chart.  Darl Pikes, PharmD, BCPS Hematology/Oncology Clinical Pharmacist [facility] [phone]  12/04/2017 11:31 AM

## 2017-12-04 NOTE — Telephone Encounter (Signed)
Oral Oncology Pharmacist Encounter   Prior Authorization for Shanon Brow has been approved.     Referral # [id number] Effective dates: 07/29/17 through 07/30/2018   Oral Oncology Clinic will continue to follow.   Darl Pikes, PharmD, BCPS Hematology/Oncology Clinical Pharmacist [facility] [phone]  12/04/2017 11:43 AM

## 2017-12-04 NOTE — Telephone Encounter (Signed)
Oral Chemotherapy Pharmacist Encounter   Attempted to call patient to set her up with [facility] shipment and provide medication education. LVM for patient to return my call.    Darl Pikes, PharmD, BCPS Hematology/Oncology Clinical Pharmacist [facility] [phone]  12/04/2017 2:20 PM

## 2017-12-05 NOTE — Telephone Encounter (Signed)
Oral Chemotherapy Pharmacist Encounter  Patient Education I spoke with patient for overview of new oral chemotherapy medication: Jakafi (ruxolitinib) for the treatment of post-essential thrombocythemia myelofibrosis, planned duration until disease progression or unacceptable drug toxicity.   Pt is doing well. Counseled patient on administration, dosing, side effects, monitoring, drug-food interactions, safe handling, storage, and disposal. Patient will take 1 tablet (20 mg total) by mouth 2 (two) times daily.  Side effects include but not limited to: decreased plt/hgb, elevated cholesterol.    Reviewed with patient importance of keeping a medication schedule and plan for any missed doses.  Rachael Johnson voiced understanding and appreciation. All questions answered. Placed medication handout in mail. She knows to get started when she receives her medication.   Provided patient with [facility] phone number. Patient knows to call the office with questions or concerns. Oral Chemotherapy [facility] will continue to follow.  Darl Pikes, PharmD, BCPS Hematology/Oncology Clinical Pharmacist [facility] [phone]  12/05/2017 10:43 AM

## 2017-12-06 LAB — CHROMOSOME ANALYSIS, BONE MARROW

## 2017-12-06 MED FILL — JAKAFI 20 MG TABLET: 20 | 30 days supply | Qty: 60 | Fill #0

## 2017-12-20 ENCOUNTER — Other Ambulatory Visit: Payer: Self-pay

## 2017-12-20 MED ORDER — LEVOTHYROXINE SODIUM 112 MCG PO TABS: 112.0000 ug | ORAL_TABLET | Freq: Every day | ORAL | 3 refills | Status: DC

## 2017-12-27 ENCOUNTER — Ambulatory Visit (INDEPENDENT_AMBULATORY_CARE_PROVIDER_SITE_OTHER): Payer: Medicare Other | Admitting: Family Medicine

## 2017-12-27 ENCOUNTER — Encounter: Payer: Self-pay | Admitting: Family Medicine

## 2017-12-27 ENCOUNTER — Inpatient Hospital Stay: Payer: Medicare Other

## 2017-12-27 ENCOUNTER — Encounter: Payer: Self-pay | Admitting: Hematology & Oncology

## 2017-12-27 ENCOUNTER — Inpatient Hospital Stay (HOSPITAL_BASED_OUTPATIENT_CLINIC_OR_DEPARTMENT_OTHER): Payer: Medicare Other | Admitting: Hematology & Oncology

## 2017-12-27 ENCOUNTER — Other Ambulatory Visit: Payer: Self-pay

## 2017-12-27 VITALS — BP 140/82 | HR 88 | Temp 98.4°F | Resp 18 | Wt 150.4 lb

## 2017-12-27 VITALS — BP 150/75 | HR 88 | Temp 98.2°F | Resp 18 | Wt 150.0 lb

## 2017-12-27 DIAGNOSIS — Z79899 Other long term (current) drug therapy: Secondary | ICD-10-CM

## 2017-12-27 DIAGNOSIS — I10 Essential (primary) hypertension: Secondary | ICD-10-CM

## 2017-12-27 DIAGNOSIS — E039 Hypothyroidism, unspecified: Secondary | ICD-10-CM

## 2017-12-27 DIAGNOSIS — M549 Dorsalgia, unspecified: Secondary | ICD-10-CM | POA: Diagnosis not present

## 2017-12-27 DIAGNOSIS — D509 Iron deficiency anemia, unspecified: Secondary | ICD-10-CM | POA: Diagnosis not present

## 2017-12-27 DIAGNOSIS — G8929 Other chronic pain: Secondary | ICD-10-CM

## 2017-12-27 DIAGNOSIS — E032 Hypothyroidism due to medicaments and other exogenous substances: Secondary | ICD-10-CM | POA: Diagnosis not present

## 2017-12-27 DIAGNOSIS — R300 Dysuria: Secondary | ICD-10-CM | POA: Diagnosis not present

## 2017-12-27 DIAGNOSIS — D500 Iron deficiency anemia secondary to blood loss (chronic): Secondary | ICD-10-CM

## 2017-12-27 DIAGNOSIS — E559 Vitamin D deficiency, unspecified: Secondary | ICD-10-CM | POA: Diagnosis not present

## 2017-12-27 DIAGNOSIS — D473 Essential (hemorrhagic) thrombocythemia: Secondary | ICD-10-CM

## 2017-12-27 DIAGNOSIS — Z7982 Long term (current) use of aspirin: Secondary | ICD-10-CM

## 2017-12-27 DIAGNOSIS — D7581 Myelofibrosis: Secondary | ICD-10-CM

## 2017-12-27 DIAGNOSIS — R739 Hyperglycemia, unspecified: Secondary | ICD-10-CM | POA: Diagnosis not present

## 2017-12-27 LAB — CMP (CANCER CENTER ONLY)
ALT: 23 U/L (ref 10–47)
AST: 33 U/L (ref 11–38)
Albumin: 4 g/dL (ref 3.5–5.0)
Alkaline Phosphatase: 89 U/L — ABNORMAL HIGH (ref 26–84)
Anion gap: 3 — ABNORMAL LOW (ref 5–15)
BUN: 12 mg/dL (ref 7–22)
CO2: 30 mmol/L (ref 18–33)
Calcium: 8.9 mg/dL (ref 8.0–10.3)
Chloride: 108 mmol/L (ref 98–108)
Creatinine: 0.6 mg/dL (ref 0.60–1.20)
Glucose, Bld: 120 mg/dL — ABNORMAL HIGH (ref 73–118)
Potassium: 3.4 mmol/L (ref 3.3–4.7)
Sodium: 141 mmol/L (ref 128–145)
Total Bilirubin: 0.5 mg/dL (ref 0.2–1.6)
Total Protein: 7.5 g/dL (ref 6.4–8.1)

## 2017-12-27 LAB — CBC WITH DIFFERENTIAL (CANCER CENTER ONLY)
Basophils Absolute: 0 10*3/uL (ref 0.0–0.1)
Basophils Relative: 0 %
Eosinophils Absolute: 0.3 10*3/uL (ref 0.0–0.5)
Eosinophils Relative: 5 %
HCT: 37.9 % (ref 34.8–46.6)
Hemoglobin: 12.3 g/dL (ref 11.6–15.9)
Lymphocytes Relative: 18 %
Lymphs Abs: 0.8 10*3/uL — ABNORMAL LOW (ref 0.9–3.3)
MCH: 33.8 pg (ref 26.0–34.0)
MCHC: 32.5 g/dL (ref 32.0–36.0)
MCV: 104.1 fL — ABNORMAL HIGH (ref 81.0–101.0)
Monocytes Absolute: 0.4 10*3/uL (ref 0.1–0.9)
Monocytes Relative: 8 %
Neutro Abs: 3.3 10*3/uL (ref 1.5–6.5)
Neutrophils Relative %: 69 %
Platelet Count: 529 10*3/uL — ABNORMAL HIGH (ref 145–400)
RBC: 3.64 MIL/uL — ABNORMAL LOW (ref 3.70–5.32)
RDW: 13.1 % (ref 11.1–15.7)
WBC Count: 4.8 10*3/uL (ref 3.9–10.0)

## 2017-12-27 MED ORDER — HYDROCODONE-ACETAMINOPHEN 7.5-325 MG PO TABS: 1.0000 | ORAL_TABLET | Freq: Four times a day (QID) | ORAL | 0 refills | Status: DC | PRN

## 2017-12-27 MED ORDER — MONTELUKAST SODIUM 10 MG PO TABS: 10.0000 mg | ORAL_TABLET | Freq: Every day | ORAL | 2 refills | Status: DC

## 2017-12-27 MED ORDER — SILVER SULFADIAZINE 1 % EX CREA: 1.0000 "application " | TOPICAL_CREAM | Freq: Every day | CUTANEOUS | 1 refills | Status: DC

## 2017-12-27 MED ORDER — FUROSEMIDE 20 MG PO TABS: 20.0000 mg | ORAL_TABLET | Freq: Three times a day (TID) | ORAL | 2 refills | Status: DC | PRN

## 2017-12-27 NOTE — Progress Notes (Signed)
Subjective:  I acted as a Education administrator for Dr. Charlett Blake. [name], [place]  Patient ID: Rachael Johnson, female    DOB: 1951/04/23, 67 y.o.   MRN: [id number]  No chief complaint on file.   HPI  Patient is in today for a 6 week follow up and she continues to improve. Her lesion on her right shoulder is finally completely healed. No recent febrile illness or hospitalizations. She continues to struggle with stress and anxiety but is managing well with current meds. Her chronic pain is stable and well managed. No recent fall or injury. Denies CP/palp/SOB/HA/congestion/fevers/GI or GU c/o. Taking meds as prescribed  Patient Care Team: Mosie Lukes, MD as PCP - General (Family Medicine) Marin Olp Rudell Cobb, MD as Consulting Physician (Oncology) Glenna Fellows, MD as Attending Physician (Neurosurgery)   Past Medical History:  Diagnosis Date  . Acute renal insufficiency 04/09/2017  . Anemia, iron deficiency 07/08/2012  . Arthritis   . Cataract 12/21/2014  . Depression with anxiety 03/24/2011  . Diverticulitis   . Esophageal reflux 08/17/2013  . Essential thrombocythemia ([place]) 01/24/2011  . Frequent episodic tension-type headache   . Gastroenteritis 12/21/2014  . 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 GERD (gastroesophageal reflux disease)   . H. pylori infection 12/19/2012  . Hyperglycemia 12/17/2015  . Hypertension   . Iron deficiency anemia due to chronic blood loss 04/03/2017  . Osteoarthritis 05/24/2017  . Other and unspecified hyperlipidemia 02/25/2013  . Restless leg syndrome 09/30/2014  . Rhabdomyolysis 02/2017  . Scabies 03/28/2015  . 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 Seizure ([place])    childhood  . Shoulder wound, right, sequela 03/14/2017  . Thyroid disease     Past Surgical History:  Procedure Laterality Date  . ABDOMINAL HYSTERECTOMY     1992  . BREAST CYST ASPIRATION    . BREAST SURGERY  1992   biopsy, benign. Fibrocystic  . UMBILICAL HERNIA REPAIR N/A 09/25/2012   Procedure: HERNIA REPAIR  UMBILICAL ADULT;  Surgeon: Harl Bowie, MD;  Location: [facility];  Service: General;  Laterality: N/A;    Family History  Problem Relation Age of Onset  . Arthritis Mother   . Cancer Mother        ovarian  . Hypertension Mother   . Heart disease Mother        pacer  . Heart failure Mother   . Arthritis Father   . Cancer Father        lung  . Hypertension Father   . Cancer Sister        lung  . Cancer Brother        prostate  . Cancer Brother        lung  . Hypertension Son   . COPD Brother   . Heart disease Brother        died from CHF  . Hypertension Brother   . Cancer Brother        colon  . Alcohol abuse Brother   . Cirrhosis Brother   . Seizures Sister   . Stroke Sister   . Arthritis Brother     Social History   Socioeconomic History  . Marital status: Widowed    Spouse name: Not on file  . Number of children: 2  . Years of education: Not on file  . Highest education level: Not on file  Occupational History  . Occupation: APL IT sales professional: [employer]    Comment: [employer] [place]  .  Financial resource strain: Not on file  . Food insecurity:    Worry: Not on file    Inability: Not on file  . Transportation needs:    Medical: Not on file    Non-medical: Not on file  Tobacco Use  . Smoking status: Former Smoker    Packs/day: 3.50    Years: 20.00    Pack years: 70.00    Types: Cigarettes    Start date: 11/15/1970    Last attempt to quit: 07/31/1990    Years since quitting: 27.4  . Smokeless tobacco: Never Used  . Tobacco comment: quit 23 years ago  Substance and Sexual Activity  . Alcohol use: No    Alcohol/week: 0.0 oz  . Drug use: No  . Sexual activity: Never  Lifestyle  . Physical activity:    Days per week: Not on file    Minutes per session: Not on file  . Stress: Not on file  Relationships  . Social connections:    Talks on phone: Not on file    Gets together: Not on file    Attends religious service: Not on file     Active member of club or organization: Not on file    Attends meetings of clubs or organizations: Not on file    Relationship status: Not on file  . Intimate partner violence:    Fear of current or ex partner: Not on file    Emotionally abused: Not on file    Physically abused: Not on file    Forced sexual activity: Not on file  Other Topics Concern  . Not on file  Social History Narrative   Regular exercise: no   Caffeine Use: 2-3 weekly    Outpatient Medications Prior to Visit  Medication Sig Dispense Refill  . albuterol (VENTOLIN HFA) 108 (90 Base) MCG/ACT inhaler INHALE 2 PUFFS INTO THE LUNGS EVERY 6 (SIX) HOURS AS NEEDED FOR WHEEZING OR SHORTNESS OF BREATH.  Can use Ventolin, ProAir or Proventil whichever cheaper 54 Inhaler 3  . ALPRAZolam (XANAX) 1 MG tablet TAKE 1 TABLET 3 TIMES A DAY AS NEEDED FOR ANIXETY 180 tablet 1  . aspirin EC 81 MG tablet Take 81 mg by mouth every morning.    . budesonide-formoterol (SYMBICORT) 80-4.5 MCG/ACT inhaler Inhale 2 puffs into the lungs 2 (two) times daily. 1 Inhaler 0  . busPIRone (BUSPAR) 10 MG tablet Take 1 tablet (10 mg total) by mouth 3 (three) times daily. 90 tablet 0  . cetirizine (ZYRTEC) 10 MG tablet Take 1 tablet (10 mg total) by mouth daily. 90 tablet 1  . chlorpheniramine (CHLOR-TRIMETON) 4 MG tablet Take 8 mg by mouth at bedtime.    . Cranberry 300 MG tablet Take 300 mg by mouth 2 (two) times daily.    . cyclobenzaprine (FLEXERIL) 10 MG tablet TAKE 1 TABLET (10 MG TOTAL) BY MOUTH 2 (TWO) TIMES DAILY AS NEEDED FOR MUSCLE SPASMS. 60 tablet 3  . estradiol (ESTRACE) 0.5 MG tablet TAKE 1 TABLET (0.5 MG TOTAL) BY MOUTH 2 (TWO) TIMES DAILY. 180 tablet 1  . fluticasone (FLONASE) 50 MCG/ACT nasal spray Place 2 sprays into both nostrils daily. 48 g 3  . levothyroxine (SYNTHROID, LEVOTHROID) 112 MCG tablet Take 1 tablet (112 mcg total) by mouth daily. 30 tablet 3  . meclizine (ANTIVERT) 12.5 MG tablet TAKE 1 TABLET THREE TIMES A DAY AS NEEDED  FOR NAUSEA OR DIZZINESS 30 tablet 1  . Multiple Vitamin (MULTIVITAMIN PO) Take 1 tablet by mouth  every morning.     . ranitidine (ZANTAC) 300 MG tablet Take 1 tablet (300 mg total) by mouth at bedtime. 90 tablet 1  . RESTASIS 0.05 % ophthalmic emulsion PLACE ONE DROP INTO EACH EYE EVERY 12 HOURS  3  . ruxolitinib phosphate (JAKAFI) 20 MG tablet Take 1 tablet (20 mg total) by mouth 2 (two) times daily. 60 tablet 4  . venlafaxine XR (EFFEXOR-XR) 150 MG 24 hr capsule TAKE 1 CAPSULE (150 MG TOTAL) BY MOUTH DAILY WITH BREAKFAST. 90 capsule 1  . furosemide (LASIX) 20 MG tablet TAKE 1 TABLET (20 MG TOTAL) BY MOUTH 2 (TWO) TIMES DAILY AS NEEDED FOR FLUID OR EDEMA. 90 tablet 0  . HYDROcodone-acetaminophen (NORCO) 7.5-325 MG tablet Take 1 tablet by mouth every 6 (six) hours as needed for moderate pain. 120 tablet 0  . montelukast (SINGULAIR) 10 MG tablet TAKE 1 TABLET (10 MG TOTAL) BY MOUTH AT BEDTIME. 90 tablet 2   No facility-administered medications prior to visit.     No Known Allergies  Review of Systems  Constitutional: Positive for malaise/fatigue. Negative for fever.  HENT: Negative for congestion.   Eyes: Negative for blurred vision.  Respiratory: Negative for shortness of breath.   Cardiovascular: Negative for chest pain, palpitations and leg swelling.  Gastrointestinal: Negative for abdominal pain, blood in stool and nausea.  Genitourinary: Negative for dysuria and frequency.  Musculoskeletal: Negative for falls.  Skin: Negative for rash.  Neurological: Negative for dizziness, loss of consciousness and headaches.  Endo/Heme/Allergies: Negative for environmental allergies.  Psychiatric/Behavioral: Negative for depression. The patient is nervous/anxious.        Objective:    Physical Exam  Constitutional: She is oriented to person, place, and time. No distress.  HENT:  Head: Normocephalic and atraumatic.  Eyes: Conjunctivae are normal.  Neck: Neck supple. No thyromegaly present.    Cardiovascular: Normal rate, regular rhythm and normal heart sounds.  No murmur heard. Pulmonary/Chest: Effort normal and breath sounds normal. She has no wheezes.  Abdominal: She exhibits no distension and no mass.  Musculoskeletal: She exhibits no edema.  Lymphadenopathy:    She has no cervical adenopathy.  Neurological: She is alert and oriented to person, place, and time.  Skin: Skin is warm and dry. No rash noted. She is not diaphoretic.  Psychiatric: Judgment normal.    BP 140/82   Pulse 88   Temp 98.4 F (36.9 C) (Oral)   Resp 18   Wt 150 lb 6.4 oz (68.2 kg)   LMP 07/31/1990   SpO2 97%   BMI 26.22 kg/m  Wt Readings from Last 3 Encounters:  12/27/17 150 lb 6.4 oz (68.2 kg)  12/27/17 150 lb (68 kg)  11/29/17 147 lb (66.7 kg)   BP Readings from Last 3 Encounters:  12/30/17 140/82  12/27/17 (!) 150/75  11/29/17 126/64     Immunization History  Administered Date(s) Administered  . Influenza Whole 05/16/2013  . Influenza, High Dose Seasonal PF 04/30/2017  . Influenza,inj,Quad PF,6+ Mos 04/02/2015, 04/18/2016  . Influenza-Unspecified 05/31/2014  . Pneumococcal Conjugate-13 04/04/2013  . Pneumococcal Polysaccharide-23 04/18/2016  . Pneumococcal-Unspecified 04/10/2011  . Td 08/01/2007  . Tdap 07/06/2014  . Zoster 04/28/2013    Health Maintenance  Topic Date Due  . Hepatitis C Screening  12-06-1950  . PAP SMEAR  10/30/2014  . INFLUENZA VACCINE  02/28/2018  . MAMMOGRAM  11/30/2019  . COLONOSCOPY  11/28/2020  . PNA vac Low Risk Adult (2 of 2 - PPSV23) 04/18/2021  . TETANUS/TDAP  07/06/2024  . DEXA SCAN  Completed  . HIV Screening  Completed    Lab Results  Component Value Date   WBC 4.8 12/27/2017   HGB 12.3 12/27/2017   HCT 37.9 12/27/2017   PLT 529 (H) 12/27/2017   GLUCOSE 120 (H) 12/27/2017   CHOL 250 (H) 08/10/2017   TRIG 313.0 (H) 08/10/2017   HDL 56.30 08/10/2017   LDLDIRECT 123.0 08/10/2017   LDLCALC 82 02/12/2017   ALT 23 12/27/2017   AST  33 12/27/2017   NA 141 12/27/2017   K 3.4 12/27/2017   CL 108 12/27/2017   CREATININE 0.60 12/27/2017   BUN 12 12/27/2017   CO2 30 12/27/2017   TSH 0.112 (L) 12/27/2017   INR 0.95 11/16/2017   HGBA1C 5.0 08/10/2017    Lab Results  Component Value Date   TSH 0.112 (L) 12/27/2017   Lab Results  Component Value Date   WBC 4.8 12/27/2017   HGB 12.3 12/27/2017   HCT 37.9 12/27/2017   MCV 104.1 (H) 12/27/2017   PLT 529 (H) 12/27/2017   Lab Results  Component Value Date   NA 141 12/27/2017   K 3.4 12/27/2017   CHLORIDE 105 11/29/2016   CO2 30 12/27/2017   GLUCOSE 120 (H) 12/27/2017   BUN 12 12/27/2017   CREATININE 0.60 12/27/2017   BILITOT 0.5 12/27/2017   ALKPHOS 89 (H) 12/27/2017   AST 33 12/27/2017   ALT 23 12/27/2017   PROT 7.5 12/27/2017   ALBUMIN 4.0 12/27/2017   CALCIUM 8.9 12/27/2017   ANIONGAP 3 (L) 12/27/2017   EGFR 80 (L) 11/29/2016   GFR 86.26 08/10/2017   Lab Results  Component Value Date   CHOL 250 (H) 08/10/2017   Lab Results  Component Value Date   HDL 56.30 08/10/2017   Lab Results  Component Value Date   LDLCALC 82 02/12/2017   Lab Results  Component Value Date   TRIG 313.0 (H) 08/10/2017   Lab Results  Component Value Date   CHOLHDL 4 08/10/2017   Lab Results  Component Value Date   HGBA1C 5.0 08/10/2017         Assessment & Plan:   Problem List Items Addressed This Visit    Hypothyroid    On Levothyroxine, continue to monitor      Hyperglycemia    hgba1c acceptable, minimize simple carbs. Increase exercise as tolerated      Relevant Medications   furosemide (LASIX) 20 MG tablet   Vitamin D deficiency   Relevant Medications   furosemide (LASIX) 20 MG tablet   Dysuria   Relevant Medications   furosemide (LASIX) 20 MG tablet   Essential hypertension    Well controlled, no changes to meds. Encouraged heart healthy diet such as the DASH diet and exercise as tolerated.       Relevant Medications   furosemide  (LASIX) 20 MG tablet   Chronic back pain   Relevant Medications   HYDROcodone-acetaminophen (NORCO) 7.5-325 MG tablet    Other Visit Diagnoses    High risk medication use    -  Primary   Relevant Orders   Pain Mgmt, Profile 8 w/Conf, U      I have changed Kinzlee D. Zambito's furosemide. I am also having her start on silver sulfADIAZINE. Additionally, I am having her maintain her Multiple Vitamin (MULTIVITAMIN PO), aspirin EC, Cranberry, albuterol, fluticasone, meclizine, ranitidine, chlorpheniramine, busPIRone, budesonide-formoterol, RESTASIS, estradiol, venlafaxine XR, cyclobenzaprine, ALPRAZolam, cetirizine, ruxolitinib phosphate, levothyroxine, HYDROcodone-acetaminophen, and montelukast.  Meds ordered  this encounter  Medications  . HYDROcodone-acetaminophen (NORCO) 7.5-325 MG tablet    Sig: Take 1 tablet by mouth every 6 (six) hours as needed for moderate pain.    Dispense:  120 tablet    Refill:  0  . silver sulfADIAZINE (SILVADENE) 1 % cream    Sig: Apply 1 application topically daily.    Dispense:  50 g    Refill:  1  . furosemide (LASIX) 20 MG tablet    Sig: Take 1 tablet (20 mg total) by mouth 3 (three) times daily as needed for fluid or edema.    Dispense:  90 tablet    Refill:  2  . montelukast (SINGULAIR) 10 MG tablet    Sig: Take 1 tablet (10 mg total) by mouth at bedtime.    Dispense:  90 tablet    Refill:  2    CMA served as scribe during this visit. History, Physical and Plan performed by medical provider. Documentation and orders reviewed and attested to.  Penni Homans, MD

## 2017-12-27 NOTE — Patient Instructions (Signed)

## 2017-12-27 NOTE — Progress Notes (Signed)
Hematology and Oncology Follow Up Visit  Rachael Johnson [id number] 1950/11/10 66 y.o. 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  Finally, we have some success with her thrombocytosis.  I did start her on Jakafi.  Her platelet count has come down quite nicely.  Her platelet count is come down by about 50%.  She does feel tired.  She has lower energy.  She is had no fever.  Her right shoulder wound has healed up but is still quite tender.  She has had no headaches.  She has had no mouth sores.  She has had no diarrhea.  I am so happy that her platelet count has responded.  Hopefully, we will see a continued improvement in her platelet count and we can decrease the Jakafi dose further.   Currently, her performance status is ECOG 1.  Medications:  Allergies as of 12/27/2017   No Known Allergies     Medication List        Accurate as of 12/27/17 12:21 PM. Always use your most recent med list.          albuterol 108 (90 Base) MCG/ACT inhaler Commonly known as:  VENTOLIN HFA INHALE 2 PUFFS INTO THE LUNGS EVERY 6 (SIX) HOURS AS NEEDED FOR WHEEZING OR SHORTNESS OF BREATH.  Can use Ventolin, ProAir or Proventil whichever cheaper   ALPRAZolam 1 MG tablet Commonly known as:  XANAX TAKE 1 TABLET 3 TIMES A DAY AS NEEDED FOR ANIXETY   aspirin EC 81 MG tablet Take 81 mg by mouth every morning.   budesonide-formoterol 80-4.5 MCG/ACT inhaler Commonly known as:  SYMBICORT Inhale 2 puffs into the lungs 2 (two) times  daily.   busPIRone 10 MG tablet Commonly known as:  BUSPAR Take 1 tablet (10 mg total) by mouth 3 (three) times daily.   cetirizine 10 MG tablet Commonly known as:  ZYRTEC Take 1 tablet (10 mg total) by mouth daily.   chlorpheniramine 4 MG tablet Commonly known as:  CHLOR-TRIMETON Take 8 mg by mouth at bedtime.   Cranberry 300 MG tablet Take 300 mg by mouth 2 (two) times daily.   cyclobenzaprine 10 MG tablet Commonly known as:  FLEXERIL TAKE 1 TABLET (10 MG TOTAL) BY MOUTH 2 (TWO) TIMES DAILY AS NEEDED FOR MUSCLE SPASMS.   estradiol 0.5 MG tablet Commonly known as:  ESTRACE TAKE 1 TABLET (0.5 MG TOTAL) BY MOUTH 2 (TWO) TIMES DAILY.   fluticasone 50 MCG/ACT nasal spray Commonly known as:  FLONASE Place 2 sprays into both nostrils daily.   furosemide 20 MG tablet Commonly known as:  LASIX TAKE 1 TABLET (20 MG TOTAL) BY MOUTH 2 (TWO) TIMES DAILY AS NEEDED FOR FLUID OR EDEMA.   HYDROcodone-acetaminophen 7.5-325 MG tablet Commonly known as:  NORCO Take 1 tablet by mouth every 6 (six) hours as needed for moderate pain.   levothyroxine 112 MCG tablet Commonly known as:  SYNTHROID, LEVOTHROID Take 1 tablet (112 mcg total) by mouth daily.   meclizine 12.5 MG tablet Commonly known as:  ANTIVERT  TAKE 1 TABLET THREE TIMES A DAY AS NEEDED FOR NAUSEA OR DIZZINESS   montelukast 10 MG tablet Commonly known as:  SINGULAIR TAKE 1 TABLET (10 MG TOTAL) BY MOUTH AT BEDTIME.   MULTIVITAMIN PO Take 1 tablet by mouth every morning.   ranitidine 300 MG tablet Commonly known as:  ZANTAC Take 1 tablet (300 mg total) by mouth at bedtime.   RESTASIS 0.05 % ophthalmic emulsion Generic drug:  cycloSPORINE PLACE ONE DROP INTO EACH EYE EVERY 12 HOURS   ruxolitinib phosphate 20 MG tablet Commonly known as:  JAKAFI Take 1 tablet (20 mg total) by mouth 2 (two) times daily.   venlafaxine XR 150 MG 24 hr capsule Commonly known as:  EFFEXOR-XR TAKE 1 CAPSULE (150 MG TOTAL) BY MOUTH DAILY  WITH BREAKFAST.       Allergies: No Known Allergies  Past Medical History, Surgical history, Social history, and Family History were reviewed and updated.  Review of Systems: Review of Systems  Constitutional: Positive for diaphoresis and malaise/fatigue.  HENT: Negative.   Eyes: Negative.   Respiratory: Positive for shortness of breath.   Cardiovascular: Negative.   Gastrointestinal: Positive for abdominal pain, heartburn and nausea.  Genitourinary: Negative.   Musculoskeletal: Positive for joint pain and myalgias.  Skin: Negative.   Neurological: Positive for dizziness.  Endo/Heme/Allergies: Negative.   Psychiatric/Behavioral: Negative.      Physical Exam:  weight is 150 lb (68 kg). Her oral temperature is 98.2 F (36.8 C). Her blood pressure is 150/75 (abnormal) and her pulse is 88. Her respiration is 18 and oxygen saturation is 99%.   Wt Readings from Last 3 Encounters:  12/27/17 150 lb (68 kg)  11/29/17 147 lb (66.7 kg)  11/16/17 143 lb (64.9 kg)    Physical Exam  Constitutional: She is oriented to person, place, and time.  HENT:  Head: Normocephalic and atraumatic.  Mouth/Throat: Oropharynx is clear and moist.  Eyes: Pupils are equal, round, and reactive to light. EOM are normal.  Neck: Normal range of motion.  Cardiovascular: Normal rate, regular rhythm and normal heart sounds.  Pulmonary/Chest: Effort normal and breath sounds normal.  Abdominal: Soft. Bowel sounds are normal.  Her spleen tip is palpable a couple centimeters below the left costal margin.  Musculoskeletal: Normal range of motion. She exhibits no edema, tenderness or deformity.  Lymphadenopathy:    She has no cervical adenopathy.  Neurological: She is alert and oriented to person, place, and time.  Skin: Skin is warm and dry. No rash noted. No erythema.  Psychiatric: She has a normal mood and affect. Her behavior is normal. Judgment and thought content normal.  Vitals reviewed.    Lab  Results  Component Value Date   WBC 4.8 12/27/2017   HGB 12.3 12/27/2017   HCT 37.9 12/27/2017   MCV 104.1 (H) 12/27/2017   PLT 529 (H) 12/27/2017   Lab Results  Component Value Date   FERRITIN 792 (H) 11/29/2017   IRON 89 11/29/2017   TIBC 225 (L) 11/29/2017   UIBC 136 11/29/2017   IRONPCTSAT 40 11/29/2017   Lab Results  Component Value Date   RETICCTPCT 1.6 11/29/2017   RBC 3.64 (L) 12/27/2017   RETICCTABS 45.8 06/02/2015   No results found for: KPAFRELGTCHN, LAMBDASER, KAPLAMBRATIO No results found for: IGGSERUM, IGA, IGMSERUM No results found for: TOTALPROTELP, ALBUMINELP, A1GS, A2GS, BETS, BETA2SER, GAMS, MSPIKE, SPEI   Chemistry      Component Value Date/Time   NA 141 12/27/2017 1050   NA  143 07/16/2017 1057   NA 140 11/29/2016 0936   K 3.4 12/27/2017 1050   K 3.2 (L) 07/16/2017 1057   K 4.2 11/29/2016 0936   CL 108 12/27/2017 1050   CL 104 07/16/2017 1057   CO2 30 12/27/2017 1050   CO2 25 07/16/2017 1057   CO2 27 11/29/2016 0936   BUN 12 12/27/2017 1050   BUN 10 07/16/2017 1057   BUN 18.3 11/29/2016 0936   CREATININE 0.60 12/27/2017 1050   CREATININE 0.8 07/16/2017 1057   CREATININE 0.8 11/29/2016 0936      Component Value Date/Time   CALCIUM 8.9 12/27/2017 1050   CALCIUM 8.9 07/16/2017 1057   CALCIUM 8.9 11/29/2016 0936   ALKPHOS 89 (H) 12/27/2017 1050   ALKPHOS 71 07/16/2017 1057   ALKPHOS 77 11/29/2016 0936   AST 33 12/27/2017 1050   AST 19 11/29/2016 0936   ALT 23 12/27/2017 1050   ALT 27 07/16/2017 1057   ALT 10 11/29/2016 0936   BILITOT 0.5 12/27/2017 1050   BILITOT <0.22 11/29/2016 0936      Impression and Plan: Rachael Johnson is a very pleasant 67 yo caucasian female with essential thrombocythemia.  By the bone marrow report that she just had done, I believe that she is beginning to transform over to a myelofibrosis.  This could certainly explain the marrow fibrosis and thrombocytosis and the palpable spleen.   Hopefully, the Shanon Brow will  continue to work.  At some point, we will have to recheck her spleen.  Her quality of life is what I really want to focus on.  Maybe we will try to see this improve.  I will see her back in a month.  [name], MD 5/30/201912:21 PM

## 2017-12-28 LAB — IRON AND TIBC
Iron: 194 ug/dL — ABNORMAL HIGH (ref 41–142)
Saturation Ratios: 86 % — ABNORMAL HIGH (ref 21–57)
TIBC: 226 ug/dL — ABNORMAL LOW (ref 236–444)
UIBC: 32 ug/dL

## 2017-12-28 LAB — FERRITIN: Ferritin: 1048 ng/mL — ABNORMAL HIGH (ref 9–269)

## 2017-12-28 LAB — TSH: TSH: 0.112 u[IU]/mL — ABNORMAL LOW (ref 0.308–3.960)

## 2017-12-30 NOTE — Assessment & Plan Note (Signed)
On Levothyroxine, continue to monitor 

## 2017-12-30 NOTE — Assessment & Plan Note (Signed)
hgba1c acceptable, minimize simple carbs. Increase exercise as tolerated.  

## 2017-12-30 NOTE — Assessment & Plan Note (Signed)
Well controlled, no changes to meds. Encouraged heart healthy diet such as the DASH diet and exercise as tolerated.  °

## 2018-01-02 ENCOUNTER — Other Ambulatory Visit: Payer: Self-pay | Admitting: Family Medicine

## 2018-01-02 DIAGNOSIS — E559 Vitamin D deficiency, unspecified: Secondary | ICD-10-CM

## 2018-01-02 DIAGNOSIS — I10 Essential (primary) hypertension: Secondary | ICD-10-CM

## 2018-01-02 DIAGNOSIS — R739 Hyperglycemia, unspecified: Secondary | ICD-10-CM

## 2018-01-02 DIAGNOSIS — R300 Dysuria: Secondary | ICD-10-CM

## 2018-01-02 MED FILL — JAKAFI 20 MG TABLET: 20 | 30 days supply | Qty: 60 | Fill #1

## 2018-01-15 ENCOUNTER — Other Ambulatory Visit: Payer: Self-pay | Admitting: *Deleted

## 2018-01-15 MED ORDER — ONDANSETRON HCL 8 MG PO TABS: 8.0000 mg | ORAL_TABLET | Freq: Three times a day (TID) | ORAL | 3 refills | Status: DC | PRN

## 2018-01-17 ENCOUNTER — Other Ambulatory Visit: Payer: Self-pay | Admitting: Family Medicine

## 2018-01-17 ENCOUNTER — Encounter (INDEPENDENT_AMBULATORY_CARE_PROVIDER_SITE_OTHER): Payer: Self-pay

## 2018-01-17 MED ORDER — ONDANSETRON HCL 8 MG PO TABS: 4.0000 mg | ORAL_TABLET | Freq: Three times a day (TID) | ORAL | 1 refills | Status: DC | PRN

## 2018-01-22 ENCOUNTER — Other Ambulatory Visit: Payer: Self-pay | Admitting: Family Medicine

## 2018-01-22 DIAGNOSIS — G8929 Other chronic pain: Secondary | ICD-10-CM

## 2018-01-22 DIAGNOSIS — M549 Dorsalgia, unspecified: Principal | ICD-10-CM

## 2018-01-22 NOTE — Telephone Encounter (Signed)
Copied from [place] [id number]. Topic: Quick Communication - Rx Refill/Question >> Jan 22, 2018 11:13 AM Bea Graff, NT wrote: Medication: HYDROcodone-acetaminophen (NORCO) 7.5-325 MG tablet   Has the patient contacted their pharmacy? Yes.   (Agent: If no, request that the patient contact the pharmacy for the refill.) (Agent: If yes, when and what did the pharmacy advise?)  Preferred Pharmacy (with phone number or street name): [id number] - [place], [place] [phone] (Phone) [phone] (Fax)      Agent: Please be advised that RX refills may take up to 3 business days. We ask that you follow-up with your pharmacy.

## 2018-01-22 NOTE — Telephone Encounter (Signed)
Patient said she was unaware that her children switched all her medications to [facility] in [place]. She needs this to go to [facility]. Please advise.

## 2018-01-23 ENCOUNTER — Telehealth: Payer: Self-pay

## 2018-01-23 ENCOUNTER — Telehealth: Payer: Self-pay | Admitting: Family Medicine

## 2018-01-23 MED ORDER — BUDESONIDE-FORMOTEROL FUMARATE 80-4.5 MCG/ACT IN AERO: 2.0000 | INHALATION_SPRAY | Freq: Two times a day (BID) | RESPIRATORY_TRACT | 0 refills | Status: DC

## 2018-01-23 NOTE — Telephone Encounter (Addendum)
Patient would like her Budesonide-Formoterol Fumarate medication to be sent to her preferred pharmacy [facility] in [place]. She would also like a return call letting her know when the medication has been sent to the pharmacy.

## 2018-01-23 NOTE — Telephone Encounter (Signed)
She would also like a return call letting her know when the medication has been sent to the pharmacy.

## 2018-01-23 NOTE — Telephone Encounter (Signed)
See request °

## 2018-01-23 NOTE — Telephone Encounter (Addendum)
Copied from [place]. Topic: Quick Communication - See Telephone Encounter >> Jan 23, 2018 10:26 AM Ivar Drape wrote: CRM for notification. See Telephone encounter for: 01/23/18. Patient would like to have her albuterol (VENTOLIN HFA) 108 (90 Base) MCG/ACT inhaler medication refilled and sent to her preferred pharmacy [facility] in [place].   Patient would also like to have ALL of her medications sent to the [facility] in [place].  She would also like a return call letting her know when the medication has been sent to the pharmacy.

## 2018-01-24 ENCOUNTER — Other Ambulatory Visit: Payer: Self-pay | Admitting: Family Medicine

## 2018-01-24 ENCOUNTER — Other Ambulatory Visit: Payer: Self-pay

## 2018-01-24 DIAGNOSIS — M549 Dorsalgia, unspecified: Principal | ICD-10-CM

## 2018-01-24 DIAGNOSIS — G8929 Other chronic pain: Secondary | ICD-10-CM

## 2018-01-24 MED ORDER — BUDESONIDE-FORMOTEROL FUMARATE 80-4.5 MCG/ACT IN AERO: 2.0000 | INHALATION_SPRAY | Freq: Two times a day (BID) | RESPIRATORY_TRACT | 0 refills | Status: DC

## 2018-01-24 MED ORDER — ALBUTEROL SULFATE HFA 108 (90 BASE) MCG/ACT IN AERS: INHALATION_SPRAY | RESPIRATORY_TRACT | 1 refills | Status: DC

## 2018-01-24 MED ORDER — HYDROCODONE-ACETAMINOPHEN 7.5-325 MG PO TABS: 1.0000 | ORAL_TABLET | Freq: Four times a day (QID) | ORAL | 0 refills | Status: DC | PRN

## 2018-01-24 NOTE — Telephone Encounter (Signed)
Requesting:hydrocodone  Contract:yes ETI:[id number] risk next screen 12/15/17 Last OV:12/27/17 Next OV:03/04/18 Last Refill:12/27/17 Database:   Please advise

## 2018-01-25 ENCOUNTER — Other Ambulatory Visit: Payer: Self-pay

## 2018-01-25 ENCOUNTER — Inpatient Hospital Stay (HOSPITAL_BASED_OUTPATIENT_CLINIC_OR_DEPARTMENT_OTHER): Payer: Medicare Other | Admitting: Hematology & Oncology

## 2018-01-25 ENCOUNTER — Inpatient Hospital Stay: Payer: Medicare Other | Attending: Hematology & Oncology

## 2018-01-25 VITALS — BP 129/64 | HR 75 | Temp 97.6°F | Resp 18 | Wt 151.0 lb

## 2018-01-25 DIAGNOSIS — D473 Essential (hemorrhagic) thrombocythemia: Secondary | ICD-10-CM | POA: Diagnosis not present

## 2018-01-25 DIAGNOSIS — D509 Iron deficiency anemia, unspecified: Secondary | ICD-10-CM | POA: Insufficient documentation

## 2018-01-25 DIAGNOSIS — M549 Dorsalgia, unspecified: Principal | ICD-10-CM

## 2018-01-25 DIAGNOSIS — D7581 Myelofibrosis: Secondary | ICD-10-CM | POA: Diagnosis not present

## 2018-01-25 DIAGNOSIS — Z79899 Other long term (current) drug therapy: Secondary | ICD-10-CM

## 2018-01-25 DIAGNOSIS — Z7982 Long term (current) use of aspirin: Secondary | ICD-10-CM

## 2018-01-25 DIAGNOSIS — R5383 Other fatigue: Secondary | ICD-10-CM | POA: Diagnosis not present

## 2018-01-25 DIAGNOSIS — G8929 Other chronic pain: Secondary | ICD-10-CM

## 2018-01-25 DIAGNOSIS — M7989 Other specified soft tissue disorders: Secondary | ICD-10-CM

## 2018-01-25 DIAGNOSIS — D500 Iron deficiency anemia secondary to blood loss (chronic): Secondary | ICD-10-CM

## 2018-01-25 LAB — CBC WITH DIFFERENTIAL (CANCER CENTER ONLY)
Basophils Absolute: 0 10*3/uL (ref 0.0–0.1)
Basophils Relative: 1 %
Eosinophils Absolute: 0.1 10*3/uL (ref 0.0–0.5)
Eosinophils Relative: 3 %
HCT: 33.1 % — ABNORMAL LOW (ref 34.8–46.6)
Hemoglobin: 10.6 g/dL — ABNORMAL LOW (ref 11.6–15.9)
Lymphocytes Relative: 30 %
Lymphs Abs: 1.1 10*3/uL (ref 0.9–3.3)
MCH: 32.7 pg (ref 26.0–34.0)
MCHC: 32 g/dL (ref 32.0–36.0)
MCV: 102.2 fL — ABNORMAL HIGH (ref 81.0–101.0)
Monocytes Absolute: 0.4 10*3/uL (ref 0.1–0.9)
Monocytes Relative: 12 %
Neutro Abs: 1.9 10*3/uL (ref 1.5–6.5)
Neutrophils Relative %: 54 %
Platelet Count: 438 10*3/uL — ABNORMAL HIGH (ref 145–400)
RBC: 3.24 MIL/uL — ABNORMAL LOW (ref 3.70–5.32)
RDW: 12.9 % (ref 11.1–15.7)
WBC Count: 3.6 10*3/uL — ABNORMAL LOW (ref 3.9–10.0)

## 2018-01-25 LAB — CMP (CANCER CENTER ONLY)
ALT: 22 U/L (ref 10–47)
AST: 28 U/L (ref 11–38)
Albumin: 3.5 g/dL (ref 3.5–5.0)
Alkaline Phosphatase: 85 U/L — ABNORMAL HIGH (ref 26–84)
Anion gap: 7 (ref 5–15)
BUN: 13 mg/dL (ref 7–22)
CO2: 30 mmol/L (ref 18–33)
Calcium: 8.5 mg/dL (ref 8.0–10.3)
Chloride: 101 mmol/L (ref 98–108)
Creatinine: 0.9 mg/dL (ref 0.60–1.20)
Glucose, Bld: 101 mg/dL (ref 73–118)
Potassium: 3.5 mmol/L (ref 3.3–4.7)
Sodium: 138 mmol/L (ref 128–145)
Total Bilirubin: 0.4 mg/dL (ref 0.2–1.6)
Total Protein: 6.6 g/dL (ref 6.4–8.1)

## 2018-01-25 LAB — LACTATE DEHYDROGENASE: LDH: 189 U/L (ref 98–192)

## 2018-01-25 LAB — TECHNOLOGIST SMEAR REVIEW

## 2018-01-25 MED ORDER — BUDESONIDE-FORMOTEROL FUMARATE 80-4.5 MCG/ACT IN AERO: 2.0000 | INHALATION_SPRAY | Freq: Two times a day (BID) | RESPIRATORY_TRACT | 0 refills | Status: DC

## 2018-01-25 MED ORDER — HYDROCODONE-ACETAMINOPHEN 7.5-325 MG PO TABS: 1.0000 | ORAL_TABLET | Freq: Four times a day (QID) | ORAL | 0 refills | Status: DC | PRN

## 2018-01-25 NOTE — Progress Notes (Signed)
Hematology and Oncology Follow Up Visit  Rachael Johnson [id number] 10/15/1950 66 y.o. 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  Everything is doing better with her with respect to her blood.  Her platelet count is come down right nicely.  However, she is still quite tired.  It is been a very busy week for her.  She is doing Bible study.  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  She has a little bit of leg swelling.  This might be from medications that she is on.  Again the Shanon Brow is really helping her.  Her platelet count is now down to 438,000.  I am very impressed by this.  She has had no bleeding.  She still has pain with her right shoulder.  The wound has healed in finally but I am sure there will always be some type of neuropathy.  Currently, her performance status is ECOG 1.  Medications:  Allergies as of 01/25/2018   No Known Allergies     Medication List        Accurate as of 01/25/18  1:03 PM. Always use your most recent med list.          albuterol 108 (90 Base) MCG/ACT inhaler Commonly known as:  VENTOLIN HFA INHALE 2 PUFFS INTO THE LUNGS EVERY 6 (SIX) HOURS AS NEEDED FOR WHEEZING OR SHORTNESS OF BREATH.  Can use Ventolin, ProAir or Proventil whichever cheaper   ALPRAZolam 1 MG tablet Commonly known as:  XANAX TAKE 1 TABLET 3 TIMES A DAY AS NEEDED FOR ANIXETY   aspirin EC 81 MG tablet Take 81 mg by mouth every morning.   budesonide-formoterol 80-4.5 MCG/ACT inhaler Commonly known as:   SYMBICORT Inhale 2 puffs into the lungs 2 (two) times daily.   busPIRone 10 MG tablet Commonly known as:  BUSPAR Take 1 tablet (10 mg total) by mouth 3 (three) times daily.   cetirizine 10 MG tablet Commonly known as:  ZYRTEC Take 1 tablet (10 mg total) by mouth daily.   chlorpheniramine 4 MG tablet Commonly known as:  CHLOR-TRIMETON Take 8 mg by mouth at bedtime.   Cranberry 300 MG tablet Take 300 mg by mouth 2 (two) times daily.   cyclobenzaprine 10 MG tablet Commonly known as:  FLEXERIL TAKE 1 TABLET (10 MG TOTAL) BY MOUTH 2 (TWO) TIMES DAILY AS NEEDED FOR MUSCLE SPASMS.   estradiol 0.5 MG tablet Commonly known as:  ESTRACE TAKE 1 TABLET (0.5 MG TOTAL) BY MOUTH 2 (TWO) TIMES DAILY.   fluticasone 50 MCG/ACT nasal spray Commonly known as:  FLONASE Place 2 sprays into both nostrils daily.   furosemide 20 MG tablet Commonly known as:  LASIX Take 1 tablet (20 mg total) by mouth 3 (three) times daily as needed for fluid or edema.   HYDROcodone-acetaminophen 7.5-325 MG tablet Commonly known as:  NORCO Take 1 tablet by mouth every 6 (six) hours as needed for moderate pain.   levothyroxine 112 MCG tablet Commonly known as:  SYNTHROID, LEVOTHROID Take 1  tablet (112 mcg total) by mouth daily.   meclizine 12.5 MG tablet Commonly known as:  ANTIVERT TAKE 1 TABLET THREE TIMES A DAY AS NEEDED FOR NAUSEA OR DIZZINESS   meclizine 12.5 MG tablet Commonly known as:  ANTIVERT TAKE 1 TABLET THREE TIMES A DAY AS NEEDED FOR NAUSEA OR DIZZINESS   montelukast 10 MG tablet Commonly known as:  SINGULAIR Take 1 tablet (10 mg total) by mouth at bedtime.   MULTIVITAMIN PO Take 1 tablet by mouth every morning.   ondansetron 8 MG tablet Commonly known as:  ZOFRAN Take 1 tablet (8 mg total) by mouth every 8 (eight) hours as needed for nausea or vomiting.   ranitidine 300 MG tablet Commonly known as:  ZANTAC Take 1 tablet (300 mg total) by mouth at bedtime.   RESTASIS 0.05 %  ophthalmic emulsion Generic drug:  cycloSPORINE PLACE ONE DROP INTO EACH EYE EVERY 12 HOURS   ruxolitinib phosphate 20 MG tablet Commonly known as:  JAKAFI Take 1 tablet (20 mg total) by mouth 2 (two) times daily.   silver sulfADIAZINE 1 % cream Commonly known as:  SILVADENE Apply 1 application topically daily.   venlafaxine XR 150 MG 24 hr capsule Commonly known as:  EFFEXOR-XR TAKE 1 CAPSULE (150 MG TOTAL) BY MOUTH DAILY WITH BREAKFAST.       Allergies: No Known Allergies  Past Medical History, Surgical history, Social history, and Family History were reviewed and updated.  Review of Systems: Review of Systems  Constitutional: Positive for diaphoresis and malaise/fatigue.  HENT: Negative.   Eyes: Negative.   Respiratory: Positive for shortness of breath.   Cardiovascular: Negative.   Gastrointestinal: Positive for abdominal pain, heartburn and nausea.  Genitourinary: Negative.   Musculoskeletal: Positive for joint pain and myalgias.  Skin: Negative.   Neurological: Positive for dizziness.  Endo/Heme/Allergies: Negative.   Psychiatric/Behavioral: Negative.      Physical Exam:  weight is 151 lb (68.5 kg). Her oral temperature is 97.6 F (36.4 C). Her blood pressure is 129/64 and her pulse is 75. Her respiration is 18 and oxygen saturation is 97%.   Wt Readings from Last 3 Encounters:  01/25/18 151 lb (68.5 kg)  12/27/17 150 lb 6.4 oz (68.2 kg)  12/27/17 150 lb (68 kg)    Physical Exam  Constitutional: She is oriented to person, place, and time.  HENT:  Head: Normocephalic and atraumatic.  Mouth/Throat: Oropharynx is clear and moist.  Eyes: Pupils are equal, round, and reactive to light. EOM are normal.  Neck: Normal range of motion.  Cardiovascular: Normal rate, regular rhythm and normal heart sounds.  Pulmonary/Chest: Effort normal and breath sounds normal.  Abdominal: Soft. Bowel sounds are normal.  Her spleen tip is palpable a couple centimeters below  the left costal margin.  Musculoskeletal: Normal range of motion. She exhibits no edema, tenderness or deformity.  Lymphadenopathy:    She has no cervical adenopathy.  Neurological: She is alert and oriented to person, place, and time.  Skin: Skin is warm and dry. No rash noted. No erythema.  Psychiatric: She has a normal mood and affect. Her behavior is normal. Judgment and thought content normal.  Vitals reviewed.    Lab Results  Component Value Date   WBC 3.6 (L) 01/25/2018   HGB 10.6 (L) 01/25/2018   HCT 33.1 (L) 01/25/2018   MCV 102.2 (H) 01/25/2018   PLT 438 (H) 01/25/2018   Lab Results  Component Value Date   FERRITIN 1,048 (H) 12/27/2017  IRON 194 (H) 12/27/2017   TIBC 226 (L) 12/27/2017   UIBC 32 12/27/2017   IRONPCTSAT 86 (H) 12/27/2017   Lab Results  Component Value Date   RETICCTPCT 1.6 11/29/2017   RBC 3.24 (L) 01/25/2018   RETICCTABS 45.8 06/02/2015   No results found for: KPAFRELGTCHN, LAMBDASER, KAPLAMBRATIO No results found for: Kandis Cocking, IGMSERUM No results found for: Odetta Pink, SPEI   Chemistry      Component Value Date/Time   NA 138 01/25/2018 1105   NA 143 07/16/2017 1057   NA 140 11/29/2016 0936   K 3.5 01/25/2018 1105   K 3.2 (L) 07/16/2017 1057   K 4.2 11/29/2016 0936   CL 101 01/25/2018 1105   CL 104 07/16/2017 1057   CO2 30 01/25/2018 1105   CO2 25 07/16/2017 1057   CO2 27 11/29/2016 0936   BUN 13 01/25/2018 1105   BUN 10 07/16/2017 1057   BUN 18.3 11/29/2016 0936   CREATININE 0.90 01/25/2018 1105   CREATININE 0.8 07/16/2017 1057   CREATININE 0.8 11/29/2016 0936      Component Value Date/Time   CALCIUM 8.5 01/25/2018 1105   CALCIUM 8.9 07/16/2017 1057   CALCIUM 8.9 11/29/2016 0936   ALKPHOS 85 (H) 01/25/2018 1105   ALKPHOS 71 07/16/2017 1057   ALKPHOS 77 11/29/2016 0936   AST 28 01/25/2018 1105   AST 19 11/29/2016 0936   ALT 22 01/25/2018 1105   ALT 27 07/16/2017  1057   ALT 10 11/29/2016 0936   BILITOT 0.4 01/25/2018 1105   BILITOT <0.22 11/29/2016 0936      Impression and Plan: Rachael Johnson is a very pleasant 67 yo caucasian female with essential thrombocythemia.  By the bone marrow report that she just had done, I believe that she is beginning to transform over to a myelofibrosis.  This could certainly explain the marrow fibrosis and thrombocytosis and the palpable spleen.  We will plan to get her back in another month.  If her platelet count continues to improve, then I will decrease the dose of Jakafi.  I told her to make sure she drinks a lot of water.  [name], MD 6/28/20191:03 PM

## 2018-01-29 ENCOUNTER — Other Ambulatory Visit: Payer: Self-pay | Admitting: Family Medicine

## 2018-01-29 DIAGNOSIS — G8929 Other chronic pain: Secondary | ICD-10-CM

## 2018-01-29 DIAGNOSIS — M549 Dorsalgia, unspecified: Principal | ICD-10-CM

## 2018-01-30 NOTE — Telephone Encounter (Signed)
Both norco and symbicort refilled on 01/25/18. Request denied.

## 2018-02-02 ENCOUNTER — Other Ambulatory Visit: Payer: Self-pay | Admitting: Family Medicine

## 2018-02-04 MED FILL — JAKAFI 20 MG TABLET: 20 | 30 days supply | Qty: 60 | Fill #2

## 2018-02-04 NOTE — Telephone Encounter (Signed)
Symbicort refilled 7/6.

## 2018-02-06 ENCOUNTER — Encounter: Payer: Self-pay | Admitting: Hematology & Oncology

## 2018-02-07 ENCOUNTER — Encounter: Payer: Self-pay | Admitting: Hematology & Oncology

## 2018-02-07 ENCOUNTER — Other Ambulatory Visit: Payer: Self-pay | Admitting: Family Medicine

## 2018-02-07 MED ORDER — ALBUTEROL SULFATE HFA 108 (90 BASE) MCG/ACT IN AERS: 2.0000 | INHALATION_SPRAY | Freq: Four times a day (QID) | RESPIRATORY_TRACT | 1 refills | Status: DC | PRN

## 2018-02-07 MED ORDER — BUDESONIDE-FORMOTEROL FUMARATE 80-4.5 MCG/ACT IN AERO: 2.0000 | INHALATION_SPRAY | Freq: Two times a day (BID) | RESPIRATORY_TRACT | 5 refills | Status: DC

## 2018-02-07 NOTE — Telephone Encounter (Signed)
Copied from [place] [id number]. Topic: Inquiry >> Feb 07, 2018 12:49 PM Pricilla Handler wrote: Reason for CRM: Patient called requesting refills for Budesonide-formoterol (SYMBICORT) 80-4.5 MCG/ACT inhaler and Albuterol (VENTOLIN HFA) 108 (90 Base) MCG/ACT inhaler. Patient's preferred pharmacy is [facility] [address], [address] [place] [address] Phone: [phone] Fax: [phone] Not a 24 hour pharmacy; exact hours not known.       Thank You!!!

## 2018-02-07 NOTE — Telephone Encounter (Signed)
Req. Refill on Symbicort Inhaler and Albuterol Inhaler.  Last refill noted on Albuterol Inh.; 01/24/18; "# 54 Inhaler; RF x 1" (please address quantity, and note that this was printed and not sent electronically)  Last refill noted on Symbicort Inh.; 01/25/18; "#1 Inh.; Sample "  See progress note on 11/05/17, indicating the pt. does not use this very often, due to not being able to afford this.    [facility] in [place] was contacted.  They have no record of either of these medications on file.  Please review and advise.  See TE on 01/23/18, that pt. req. all of her medications be sent to [facility].

## 2018-02-07 NOTE — Telephone Encounter (Signed)
Rxs sent

## 2018-02-08 ENCOUNTER — Other Ambulatory Visit: Payer: Self-pay | Admitting: *Deleted

## 2018-02-08 MED ORDER — MAGIC MOUTHWASH: 5.0000 mL | Freq: Three times a day (TID) | ORAL | 3 refills | Status: DC | PRN

## 2018-02-20 ENCOUNTER — Telehealth: Payer: Self-pay | Admitting: Pharmacist

## 2018-02-20 NOTE — Telephone Encounter (Signed)
Oral Chemotherapy Pharmacist Encounter   Attempted to reach patient for follow up on oral medication: Jakafi. No answer. Left VM for patient to call back.    Darl Pikes, PharmD, BCPS, BCOP Hematology/Oncology Clinical Pharmacist [facility] [phone]  02/20/2018 12:00 PM

## 2018-02-21 NOTE — Telephone Encounter (Signed)
Oral Chemotherapy Pharmacist Encounter   Attempted to reach patient for follow up on oral medication: Jakafi. No answer. Left VM for patient to call back.   Darl Pikes, PharmD, BCPS, [facility] Hematology/Oncology Clinical Pharmacist [facility] [phone]  02/21/2018 11:53 AM

## 2018-02-22 ENCOUNTER — Other Ambulatory Visit: Payer: Self-pay | Admitting: Family Medicine

## 2018-02-22 ENCOUNTER — Inpatient Hospital Stay (HOSPITAL_BASED_OUTPATIENT_CLINIC_OR_DEPARTMENT_OTHER): Payer: Medicare Other | Admitting: Hematology & Oncology

## 2018-02-22 ENCOUNTER — Inpatient Hospital Stay: Payer: Medicare Other | Attending: Hematology & Oncology

## 2018-02-22 ENCOUNTER — Encounter: Payer: Self-pay | Admitting: Hematology & Oncology

## 2018-02-22 ENCOUNTER — Other Ambulatory Visit: Payer: Self-pay

## 2018-02-22 VITALS — BP 127/69 | HR 90 | Temp 98.1°F | Resp 16 | Wt 152.0 lb

## 2018-02-22 DIAGNOSIS — M2550 Pain in unspecified joint: Secondary | ICD-10-CM | POA: Insufficient documentation

## 2018-02-22 DIAGNOSIS — R5383 Other fatigue: Secondary | ICD-10-CM | POA: Insufficient documentation

## 2018-02-22 DIAGNOSIS — D473 Essential (hemorrhagic) thrombocythemia: Secondary | ICD-10-CM

## 2018-02-22 DIAGNOSIS — R12 Heartburn: Secondary | ICD-10-CM | POA: Insufficient documentation

## 2018-02-22 DIAGNOSIS — R0602 Shortness of breath: Secondary | ICD-10-CM

## 2018-02-22 DIAGNOSIS — M549 Dorsalgia, unspecified: Principal | ICD-10-CM

## 2018-02-22 DIAGNOSIS — D500 Iron deficiency anemia secondary to blood loss (chronic): Secondary | ICD-10-CM

## 2018-02-22 DIAGNOSIS — D509 Iron deficiency anemia, unspecified: Secondary | ICD-10-CM | POA: Diagnosis not present

## 2018-02-22 DIAGNOSIS — R42 Dizziness and giddiness: Secondary | ICD-10-CM | POA: Insufficient documentation

## 2018-02-22 DIAGNOSIS — R109 Unspecified abdominal pain: Secondary | ICD-10-CM

## 2018-02-22 DIAGNOSIS — Z79899 Other long term (current) drug therapy: Secondary | ICD-10-CM | POA: Insufficient documentation

## 2018-02-22 DIAGNOSIS — Z7982 Long term (current) use of aspirin: Secondary | ICD-10-CM | POA: Diagnosis not present

## 2018-02-22 DIAGNOSIS — D7581 Myelofibrosis: Secondary | ICD-10-CM

## 2018-02-22 DIAGNOSIS — M791 Myalgia, unspecified site: Secondary | ICD-10-CM | POA: Insufficient documentation

## 2018-02-22 DIAGNOSIS — G8929 Other chronic pain: Secondary | ICD-10-CM

## 2018-02-22 DIAGNOSIS — R61 Generalized hyperhidrosis: Secondary | ICD-10-CM

## 2018-02-22 LAB — CBC WITH DIFFERENTIAL (CANCER CENTER ONLY)
Basophils Absolute: 0 10*3/uL (ref 0.0–0.1)
Basophils Relative: 1 %
Eosinophils Absolute: 0.2 10*3/uL (ref 0.0–0.5)
Eosinophils Relative: 5 %
HCT: 35.3 % (ref 34.8–46.6)
Hemoglobin: 11.6 g/dL (ref 11.6–15.9)
Lymphocytes Relative: 41 %
Lymphs Abs: 1.4 10*3/uL (ref 0.9–3.3)
MCH: 33.1 pg (ref 26.0–34.0)
MCHC: 32.9 g/dL (ref 32.0–36.0)
MCV: 100.9 fL (ref 81.0–101.0)
Monocytes Absolute: 0.2 10*3/uL (ref 0.1–0.9)
Monocytes Relative: 7 %
Neutro Abs: 1.7 10*3/uL (ref 1.5–6.5)
Neutrophils Relative %: 46 %
Platelet Count: 491 10*3/uL — ABNORMAL HIGH (ref 145–400)
RBC: 3.5 MIL/uL — ABNORMAL LOW (ref 3.70–5.32)
RDW: 13.5 % (ref 11.1–15.7)
WBC Count: 3.5 10*3/uL — ABNORMAL LOW (ref 3.9–10.0)

## 2018-02-22 LAB — CMP (CANCER CENTER ONLY)
ALT: 24 U/L (ref 10–47)
AST: 34 U/L (ref 11–38)
Albumin: 3.8 g/dL (ref 3.5–5.0)
Alkaline Phosphatase: 94 U/L — ABNORMAL HIGH (ref 26–84)
Anion gap: 3 — ABNORMAL LOW (ref 5–15)
BUN: 16 mg/dL (ref 7–22)
CO2: 31 mmol/L (ref 18–33)
Calcium: 8.8 mg/dL (ref 8.0–10.3)
Chloride: 100 mmol/L (ref 98–108)
Creatinine: 1 mg/dL (ref 0.60–1.20)
Glucose, Bld: 105 mg/dL (ref 73–118)
Potassium: 3.4 mmol/L (ref 3.3–4.7)
Sodium: 134 mmol/L (ref 128–145)
Total Bilirubin: 0.4 mg/dL (ref 0.2–1.6)
Total Protein: 7.3 g/dL (ref 6.4–8.1)

## 2018-02-22 LAB — LACTATE DEHYDROGENASE: LDH: 254 U/L — ABNORMAL HIGH (ref 98–192)

## 2018-02-22 LAB — SAVE SMEAR

## 2018-02-22 LAB — SAMPLE TO BLOOD BANK

## 2018-02-22 MED ORDER — CYCLOBENZAPRINE HCL 10 MG PO TABS: 10.0000 mg | ORAL_TABLET | Freq: Two times a day (BID) | ORAL | 0 refills | Status: DC | PRN

## 2018-02-22 MED ORDER — ALPRAZOLAM 1 MG PO TABS: ORAL_TABLET | ORAL | 1 refills | Status: DC

## 2018-02-22 MED ORDER — HYDROCODONE-ACETAMINOPHEN 7.5-325 MG PO TABS: 1.0000 | ORAL_TABLET | Freq: Four times a day (QID) | ORAL | 0 refills | Status: DC | PRN

## 2018-02-22 NOTE — Telephone Encounter (Signed)
She should probably have a contract if Dr. Charlett Blake wants this. Will refill in PCP's absence. TY.

## 2018-02-22 NOTE — Progress Notes (Signed)
Hematology and Oncology Follow Up Visit  Rachael Johnson [id number] 02-18-1951 67 y.o. 02/22/2018   Principle Diagnosis:  Post-ET myelofibrosis Essential thrombocythemia - JAK2 negative Intermittent iron-deficiency anemia  Past Therapy: Hydrea 1000/1000/500 mg by mouth daily - on hold starting 05/14/2017 - DC'd on 05/28/2017  Current Therapy:    Anagrelide 1 mg by mouth twice daily - started on 07/16/2017 -- d/c on 11/29/2017 Aspirin 81 mg by mouth daily IV iron as indicated - last dose given on 04/03/2017 Jakafi 20mg  po BID   Interim History:  [name] is here today for follow-up.  She feels tired.  She does not sleep at nighttime.  This is been a long-standing problem for her.  She is here to be tolerating the [facility] all right.  She is had no nausea or vomiting with this.  She is eating fairly well.  She has had no bleeding.  There is been no diarrhea.  She is had no rashes.  Her right shoulder is doing okay.  Patient did fall and her left foot hurts.  We will get an x-ray of the left foot.   Currently, her performance status is ECOG 1.  Medications:  Allergies as of 02/22/2018   No Known Allergies     Medication List        Accurate as of 02/22/18  2:15 PM. Always use your most recent med list.          albuterol 108 (90 Base) MCG/ACT inhaler Commonly known as:  VENTOLIN HFA Inhale 2 puffs into the lungs every 6 (six) hours as needed for wheezing or shortness of breath.   ALPRAZolam 1 MG tablet Commonly known as:  XANAX TAKE 1 TABLET 3 TIMES A DAY AS NEEDED FOR ANIXETY   aspirin EC 81 MG tablet Take 81 mg by mouth every morning.   budesonide-formoterol 80-4.5 MCG/ACT inhaler Commonly known as:  SYMBICORT Inhale 2 puffs into the lungs 2 (two) times daily.   busPIRone 10 MG tablet Commonly known as:  BUSPAR Take 1 tablet (10 mg total) by mouth 3 (three) times daily.   cetirizine 10 MG tablet Commonly known as:  ZYRTEC Take 1 tablet (10 mg total) by  mouth daily.   chlorpheniramine 4 MG tablet Commonly known as:  CHLOR-TRIMETON Take 8 mg by mouth at bedtime.   Cranberry 300 MG tablet Take 300 mg by mouth 2 (two) times daily.   cyclobenzaprine 10 MG tablet Commonly known as:  FLEXERIL TAKE 1 TABLET (10 MG TOTAL) BY MOUTH 2 (TWO) TIMES DAILY AS NEEDED FOR MUSCLE SPASMS.   estradiol 0.5 MG tablet Commonly known as:  ESTRACE TAKE 1 TABLET (0.5 MG TOTAL) BY MOUTH 2 (TWO) TIMES DAILY.   fluticasone 50 MCG/ACT nasal spray Commonly known as:  FLONASE Place 2 sprays into both nostrils daily.   furosemide 20 MG tablet Commonly known as:  LASIX Take 1 tablet (20 mg total) by mouth 3 (three) times daily as needed for fluid or edema.   HYDROcodone-acetaminophen 7.5-325 MG tablet Commonly known as:  NORCO Take 1 tablet by mouth every 6 (six) hours as needed for moderate pain.   levothyroxine 112 MCG tablet Commonly known as:  SYNTHROID, LEVOTHROID Take 1 tablet (112 mcg total) by mouth daily.   magic mouthwash Soln Take 5 mLs by mouth 3 (three) times daily as needed for mouth pain. Components benadryl  525 mg, hydrocortisone 60 mg and nystatin 0.6 mg. 240 ml - Oral   meclizine 12.5 MG tablet Commonly  known as:  ANTIVERT TAKE 1 TABLET THREE TIMES A DAY AS NEEDED FOR NAUSEA OR DIZZINESS   meclizine 12.5 MG tablet Commonly known as:  ANTIVERT TAKE 1 TABLET THREE TIMES A DAY AS NEEDED FOR NAUSEA OR DIZZINESS   montelukast 10 MG tablet Commonly known as:  SINGULAIR Take 1 tablet (10 mg total) by mouth at bedtime.   MULTIVITAMIN PO Take 1 tablet by mouth every morning.   ondansetron 8 MG tablet Commonly known as:  ZOFRAN Take 1 tablet (8 mg total) by mouth every 8 (eight) hours as needed for nausea or vomiting.   ranitidine 300 MG tablet Commonly known as:  ZANTAC Take 1 tablet (300 mg total) by mouth at bedtime.   RESTASIS 0.05 % ophthalmic emulsion Generic drug:  cycloSPORINE PLACE ONE DROP INTO EACH EYE EVERY 12  HOURS   ruxolitinib phosphate 20 MG tablet Commonly known as:  JAKAFI Take 1 tablet (20 mg total) by mouth 2 (two) times daily.   silver sulfADIAZINE 1 % cream Commonly known as:  SILVADENE Apply 1 application topically daily.   venlafaxine XR 150 MG 24 hr capsule Commonly known as:  EFFEXOR-XR TAKE 1 CAPSULE (150 MG TOTAL) BY MOUTH DAILY WITH BREAKFAST.       Allergies: No Known Allergies  Past Medical History, Surgical history, Social history, and Family History were reviewed and updated.  Review of Systems: Review of Systems  Constitutional: Positive for diaphoresis and malaise/fatigue.  HENT: Negative.   Eyes: Negative.   Respiratory: Positive for shortness of breath.   Cardiovascular: Negative.   Gastrointestinal: Positive for abdominal pain, heartburn and nausea.  Genitourinary: Negative.   Musculoskeletal: Positive for joint pain and myalgias.  Skin: Negative.   Neurological: Positive for dizziness.  Endo/Heme/Allergies: Negative.   Psychiatric/Behavioral: Negative.      Physical Exam:  vitals were not taken for this visit.   Wt Readings from Last 3 Encounters:  01/25/18 151 lb (68.5 kg)  12/27/17 150 lb 6.4 oz (68.2 kg)  12/27/17 150 lb (68 kg)    Physical Exam  Constitutional: She is oriented to person, place, and time.  HENT:  Head: Normocephalic and atraumatic.  Mouth/Throat: Oropharynx is clear and moist.  Eyes: Pupils are equal, round, and reactive to light. EOM are normal.  Neck: Normal range of motion.  Cardiovascular: Normal rate, regular rhythm and normal heart sounds.  Pulmonary/Chest: Effort normal and breath sounds normal.  Abdominal: Soft. Bowel sounds are normal.  Her spleen tip is palpable a couple centimeters below the left costal margin.  Musculoskeletal: Normal range of motion. She exhibits no edema, tenderness or deformity.  Lymphadenopathy:    She has no cervical adenopathy.  Neurological: She is alert and oriented to person,  place, and time.  Skin: Skin is warm and dry. No rash noted. No erythema.  Psychiatric: She has a normal mood and affect. Her behavior is normal. Judgment and thought content normal.  Vitals reviewed.    Lab Results  Component Value Date   WBC 3.5 (L) 02/22/2018   HGB 11.6 02/22/2018   HCT 35.3 02/22/2018   MCV 100.9 02/22/2018   PLT 491 (H) 02/22/2018   Lab Results  Component Value Date   FERRITIN 1,048 (H) 12/27/2017   IRON 194 (H) 12/27/2017   TIBC 226 (L) 12/27/2017   UIBC 32 12/27/2017   IRONPCTSAT 86 (H) 12/27/2017   Lab Results  Component Value Date   RETICCTPCT 1.6 11/29/2017   RBC 3.50 (L) 02/22/2018   RETICCTABS  45.8 06/02/2015   No results found for: KPAFRELGTCHN, LAMBDASER, KAPLAMBRATIO No results found for: Kandis Cocking, IGMSERUM No results found for: Kathrynn Ducking, MSPIKE, SPEI   Chemistry      Component Value Date/Time   NA 138 01/25/2018 1105   NA 143 07/16/2017 1057   NA 140 11/29/2016 0936   K 3.5 01/25/2018 1105   K 3.2 (L) 07/16/2017 1057   K 4.2 11/29/2016 0936   CL 101 01/25/2018 1105   CL 104 07/16/2017 1057   CO2 30 01/25/2018 1105   CO2 25 07/16/2017 1057   CO2 27 11/29/2016 0936   BUN 13 01/25/2018 1105   BUN 10 07/16/2017 1057   BUN 18.3 11/29/2016 0936   CREATININE 0.90 01/25/2018 1105   CREATININE 0.8 07/16/2017 1057   CREATININE 0.8 11/29/2016 0936      Component Value Date/Time   CALCIUM 8.5 01/25/2018 1105   CALCIUM 8.9 07/16/2017 1057   CALCIUM 8.9 11/29/2016 0936   ALKPHOS 85 (H) 01/25/2018 1105   ALKPHOS 71 07/16/2017 1057   ALKPHOS 77 11/29/2016 0936   AST 28 01/25/2018 1105   AST 19 11/29/2016 0936   ALT 22 01/25/2018 1105   ALT 27 07/16/2017 1057   ALT 10 11/29/2016 0936   BILITOT 0.4 01/25/2018 1105   BILITOT <0.22 11/29/2016 0936      Impression and Plan: Ms. Whitehair is a very pleasant 67 yo caucasian female with essential thrombocythemia.    By the bone marrow  report that she just had done, I believe that she is beginning to transform over to a myelofibrosis.  This could certainly explain the marrow fibrosis and thrombocytosis and the palpable spleen.  I would not change the Jakafi dose right now.  Hopefully, we will not see that her platelet count continues to rise.  If it does, then I might have to add anagrelide.  I would like to see her back in another month.  I think we had maintained close follow-up with her.    [name], MD 7/26/20192:15 PM

## 2018-02-22 NOTE — Telephone Encounter (Signed)
Requesting:xanax & norco Contract:no  KDP:TELM Last OV:12/27/17 Next OV:03/04/18 Last Refill: Xanax 11/01/17 #180-1rf Norco 01/25/18 #120-0rf Database:   Please advise

## 2018-02-22 NOTE — Telephone Encounter (Signed)
Copied from [place] [id number]. Topic: Quick Communication - See Telephone Encounter >> Feb 22, 2018  1:37 PM Rosalin Hawking wrote: CRM for notification. See Telephone encounter for: 02/22/18.   Pt came in office stating is needing refill on HYDROcodone-acetaminophen (NORCO) 7.5-325 MG tablet,  ALPRAZolam (XANAX) 1 MG tablet and cyclobenzaprine (FLEXERIL) 10 MG tablet. Please send to [facility] (that is on file). Please advise pt when rx sent to pharmacy.

## 2018-02-23 ENCOUNTER — Other Ambulatory Visit: Payer: Self-pay | Admitting: Family Medicine

## 2018-02-23 DIAGNOSIS — G8929 Other chronic pain: Secondary | ICD-10-CM

## 2018-02-23 DIAGNOSIS — M549 Dorsalgia, unspecified: Principal | ICD-10-CM

## 2018-02-25 LAB — IRON AND TIBC
Iron: 220 ug/dL — ABNORMAL HIGH (ref 41–142)
Saturation Ratios: 93 % — ABNORMAL HIGH (ref 21–57)
TIBC: 238 ug/dL (ref 236–444)
UIBC: 17 ug/dL

## 2018-02-25 LAB — FERRITIN: Ferritin: 1638 ng/mL — ABNORMAL HIGH (ref 11–307)

## 2018-02-26 ENCOUNTER — Ambulatory Visit: Payer: Medicare Other | Admitting: Family Medicine

## 2018-02-26 ENCOUNTER — Other Ambulatory Visit: Payer: Self-pay

## 2018-02-26 NOTE — Telephone Encounter (Signed)
Oral Chemotherapy Pharmacist Encounter   Attempted to reach patient for follow up on oral medication:Jakafi. No answer. Left VM for patient to call back.   Darl Pikes, PharmD, BCPS, [facility] Hematology/Oncology Clinical Pharmacist [facility] [phone]  02/26/2018 11:48 AM

## 2018-02-27 NOTE — Telephone Encounter (Signed)
Oral Chemotherapy Pharmacist Encounter   Attempted to reach patient for follow up on oral medication:Jakafi. No answer. Left VM for patient to call back.  Darl Pikes, PharmD, BCPS, [facility] Hematology/Oncology Clinical Pharmacist [facility] [phone]  02/27/2018 3:44 PM

## 2018-02-28 ENCOUNTER — Other Ambulatory Visit: Payer: Self-pay | Admitting: Family Medicine

## 2018-02-28 ENCOUNTER — Encounter: Payer: Self-pay | Admitting: Family Medicine

## 2018-02-28 ENCOUNTER — Telehealth: Payer: Self-pay | Admitting: Family Medicine

## 2018-02-28 DIAGNOSIS — M549 Dorsalgia, unspecified: Principal | ICD-10-CM

## 2018-02-28 DIAGNOSIS — G8929 Other chronic pain: Secondary | ICD-10-CM

## 2018-02-28 MED ORDER — HYDROCODONE-ACETAMINOPHEN 7.5-325 MG PO TABS: 1.0000 | ORAL_TABLET | Freq: Four times a day (QID) | ORAL | 0 refills | Status: DC | PRN

## 2018-02-28 MED ORDER — CYCLOBENZAPRINE HCL 10 MG PO TABS: 10.0000 mg | ORAL_TABLET | Freq: Two times a day (BID) | ORAL | 0 refills | Status: DC | PRN

## 2018-02-28 MED FILL — JAKAFI 20 MG TABLET: 20 | 30 days supply | Qty: 60 | Fill #3

## 2018-02-28 NOTE — Addendum Note (Signed)
Addended by: Raynelle Dick R on: 02/28/2018 11:01 AM   Modules accepted: Orders

## 2018-02-28 NOTE — Telephone Encounter (Signed)
Author phoned pharmacy at [facility] where flexeril and norco were sent on 7/26, and cancelled the orders. Orders for norco and flexeril sent to [facility] in [place] instead per pt. Request.

## 2018-02-28 NOTE — Telephone Encounter (Signed)
Copied from [place] [id number]. Topic: Quick Communication - Rx Refill/Question >> Feb 28, 2018  2:43 PM Burchel, Abbi R wrote: Medication: HYDROcodone-acetaminophen (NORCO) 7.5-325 MG tablet  [facility] ([facility]) states that this Rx cannot be filled bc it was faxed over.  Please e-scribe it.

## 2018-02-28 NOTE — Telephone Encounter (Signed)
Please send to [facility] in [place], were sent to [facility] on 02/22/18. Patient is out of meds.

## 2018-03-01 ENCOUNTER — Other Ambulatory Visit: Payer: Self-pay | Admitting: Family Medicine

## 2018-03-01 DIAGNOSIS — M549 Dorsalgia, unspecified: Principal | ICD-10-CM

## 2018-03-01 DIAGNOSIS — G8929 Other chronic pain: Secondary | ICD-10-CM

## 2018-03-01 MED ORDER — HYDROCODONE-ACETAMINOPHEN 7.5-325 MG PO TABS: 1.0000 | ORAL_TABLET | Freq: Four times a day (QID) | ORAL | 0 refills | Status: DC | PRN

## 2018-03-01 NOTE — Telephone Encounter (Signed)
Medication was faxed over can you please escribe

## 2018-03-01 NOTE — Telephone Encounter (Signed)
Sent to [facility]

## 2018-03-04 ENCOUNTER — Ambulatory Visit (INDEPENDENT_AMBULATORY_CARE_PROVIDER_SITE_OTHER): Payer: Medicare Other | Admitting: Family Medicine

## 2018-03-04 ENCOUNTER — Telehealth: Payer: Self-pay | Admitting: Family Medicine

## 2018-03-04 DIAGNOSIS — R739 Hyperglycemia, unspecified: Secondary | ICD-10-CM

## 2018-03-04 DIAGNOSIS — E039 Hypothyroidism, unspecified: Secondary | ICD-10-CM | POA: Diagnosis not present

## 2018-03-04 DIAGNOSIS — R634 Abnormal weight loss: Secondary | ICD-10-CM

## 2018-03-04 DIAGNOSIS — E559 Vitamin D deficiency, unspecified: Secondary | ICD-10-CM

## 2018-03-04 DIAGNOSIS — I10 Essential (primary) hypertension: Secondary | ICD-10-CM

## 2018-03-04 DIAGNOSIS — D7581 Myelofibrosis: Secondary | ICD-10-CM

## 2018-03-04 DIAGNOSIS — M549 Dorsalgia, unspecified: Secondary | ICD-10-CM | POA: Diagnosis not present

## 2018-03-04 DIAGNOSIS — D539 Nutritional anemia, unspecified: Secondary | ICD-10-CM

## 2018-03-04 DIAGNOSIS — R300 Dysuria: Secondary | ICD-10-CM | POA: Diagnosis not present

## 2018-03-04 DIAGNOSIS — M1990 Unspecified osteoarthritis, unspecified site: Secondary | ICD-10-CM

## 2018-03-04 DIAGNOSIS — M159 Polyosteoarthritis, unspecified: Secondary | ICD-10-CM

## 2018-03-04 DIAGNOSIS — G8929 Other chronic pain: Secondary | ICD-10-CM

## 2018-03-04 MED ORDER — OXYCODONE-ACETAMINOPHEN 7.5-325 MG PO TABS: 1.0000 | ORAL_TABLET | ORAL | 0 refills | Status: DC | PRN

## 2018-03-04 MED ORDER — BUSPIRONE HCL 10 MG PO TABS: 10.0000 mg | ORAL_TABLET | Freq: Three times a day (TID) | ORAL | 0 refills | Status: DC

## 2018-03-04 MED ORDER — MECLIZINE HCL 12.5 MG PO TABS
12.5000 mg | ORAL_TABLET | Freq: Three times a day (TID) | ORAL | 2 refills | Status: DC | PRN
Start: 2018-03-04 — End: 2018-08-08

## 2018-03-04 MED ORDER — ALPRAZOLAM 1 MG PO TABS: 0.5000 mg | ORAL_TABLET | Freq: Three times a day (TID) | ORAL | 1 refills | Status: DC | PRN

## 2018-03-04 MED ORDER — SILVER SULFADIAZINE 1 % EX CREA: 1.0000 "application " | TOPICAL_CREAM | Freq: Every day | CUTANEOUS | 1 refills | Status: DC

## 2018-03-04 NOTE — Patient Instructions (Signed)

## 2018-03-04 NOTE — Telephone Encounter (Signed)
Copied from [place] [phone]. Topic: Quick Communication - See Telephone Encounter >> Mar 04, 2018  5:52 PM Vernona Rieger wrote: CRM for notification. See Telephone encounter for: 03/04/18.  Patient said she was just in the office and Dr Charlett Blake changed her pain medication from hydrocodone to oxyCODONE-acetaminophen (PERCOCET) 7.5-325 MG tablet. They told her at the pharmacy that she can not pick it up because she has 2 refills on the hydrocodone. Please advise.

## 2018-03-05 NOTE — Telephone Encounter (Signed)
Called pharmacy and had them d/c hydrocodone and approve percocet

## 2018-03-06 NOTE — Assessment & Plan Note (Signed)
Has noted some recent weight gain and she is eating some better. Encouraged to ely on protein and veg with plenty of water.

## 2018-03-06 NOTE — Assessment & Plan Note (Signed)
Well controlled, no changes to meds. Encouraged heart healthy diet such as the DASH diet and exercise as tolerated.  °

## 2018-03-06 NOTE — Assessment & Plan Note (Signed)
Follows with hematology. Increase leafy greens, consider increased lean red meat and using cast iron cookware. Continue to monitor, report any concerns

## 2018-03-06 NOTE — Assessment & Plan Note (Signed)
Struggles with daily pain and manages with Norco but it has worked as well lately so we will try a course of Percocet and she is encouraged to stay as active as possible and use pain meds as little as possible

## 2018-03-06 NOTE — Assessment & Plan Note (Signed)
On Levothyroxine, continue to monitor 

## 2018-03-06 NOTE — Assessment & Plan Note (Signed)
Supplement and monitor 

## 2018-03-06 NOTE — Progress Notes (Signed)
Subjective:    Patient ID: Rachael Johnson, female    DOB: 1950-10-16, 67 y.o.   MRN: [id number]  No chief complaint on file.   HPI Patient is in today for follow up and she cotninues to struggle with daily pain in back, joints and muscles. Has used Norco historically with some relief but lately it has worked less well. No falls or trauma noted. No recent falls or trauma. She notes some recent weight gain but she notes this has happenedin the past. She does not endorse eating more or moving less. Denies CP/palp/SOB/HA/congestion/fevers/GI or GU c/o. Taking meds as prescribed  Past Medical History:  Diagnosis Date  . Acute renal insufficiency 04/09/2017  . Anemia, iron deficiency 07/08/2012  . Arthritis   . Cataract 12/21/2014  . Depression with anxiety 03/24/2011  . Diverticulitis   . Esophageal reflux 08/17/2013  . Essential thrombocythemia ([place]) 01/24/2011  . Frequent episodic tension-type headache   . Gastroenteritis 12/21/2014  . 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 GERD (gastroesophageal reflux disease)   . H. pylori infection 12/19/2012  . Hyperglycemia 12/17/2015  . Hypertension   . Iron deficiency anemia due to chronic blood loss 04/03/2017  . Osteoarthritis 05/24/2017  . Other and unspecified hyperlipidemia 02/25/2013  . Restless leg syndrome 09/30/2014  . Rhabdomyolysis 02/2017  . Scabies 03/28/2015  . 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 Seizure ([place])    childhood  . Shoulder wound, right, sequela 03/14/2017  . Thyroid disease     Past Surgical History:  Procedure Laterality Date  . ABDOMINAL HYSTERECTOMY     1992  . BREAST CYST ASPIRATION    . BREAST SURGERY  1992   biopsy, benign. Fibrocystic  . UMBILICAL HERNIA REPAIR N/A 09/25/2012   Procedure: HERNIA REPAIR UMBILICAL ADULT;  Surgeon: Harl Bowie, MD;  Location: [facility];  Service: General;  Laterality: N/A;    Family History  Problem Relation Age of Onset  . Arthritis Mother   . Cancer Mother        ovarian  .  Hypertension Mother   . Heart disease Mother        pacer  . Heart failure Mother   . Arthritis Father   . Cancer Father        lung  . Hypertension Father   . Cancer Sister        lung  . Cancer Brother        prostate  . Cancer Brother        lung  . Hypertension Son   . COPD Brother   . Heart disease Brother        died from CHF  . Hypertension Brother   . Cancer Brother        colon  . Alcohol abuse Brother   . Cirrhosis Brother   . Seizures Sister   . Stroke Sister   . Arthritis Brother     Social History   Socioeconomic History  . Marital status: Widowed    Spouse name: Not on file  . Number of children: 2  . Years of education: Not on file  . Highest education level: Not on file  Occupational History  . Occupation: APL IT sales professional: [employer]    Comment: [employer] [place]  . Financial resource strain: Not on file  . Food insecurity:    Worry: Not on file    Inability: Not on file  . Transportation needs:    Medical:  Not on file    Non-medical: Not on file  Tobacco Use  . Smoking status: Former Smoker    Packs/day: 3.50    Years: 20.00    Pack years: 70.00    Types: Cigarettes    Start date: 11/15/1970    Last attempt to quit: 07/31/1990    Years since quitting: 27.6  . Smokeless tobacco: Never Used  . Tobacco comment: quit 23 years ago  Substance and Sexual Activity  . Alcohol use: No    Alcohol/week: 0.0 oz  . Drug use: No  . Sexual activity: Never  Lifestyle  . Physical activity:    Days per week: Not on file    Minutes per session: Not on file  . Stress: Not on file  Relationships  . Social connections:    Talks on phone: Not on file    Gets together: Not on file    Attends religious service: Not on file    Active member of club or organization: Not on file    Attends meetings of clubs or organizations: Not on file    Relationship status: Not on file  . Intimate partner violence:    Fear of current or ex partner: Not  on file    Emotionally abused: Not on file    Physically abused: Not on file    Forced sexual activity: Not on file  Other Topics Concern  . Not on file  Social History Narrative   Regular exercise: no   Caffeine Use: 2-3 weekly    Outpatient Medications Prior to Visit  Medication Sig Dispense Refill  . albuterol (VENTOLIN HFA) 108 (90 Base) MCG/ACT inhaler Inhale 2 puffs into the lungs every 6 (six) hours as needed for wheezing or shortness of breath. 54 Inhaler 1  . aspirin EC 81 MG tablet Take 81 mg by mouth every morning.    . budesonide-formoterol (SYMBICORT) 80-4.5 MCG/ACT inhaler Inhale 2 puffs into the lungs 2 (two) times daily. 10.2 g 5  . cetirizine (ZYRTEC) 10 MG tablet Take 1 tablet (10 mg total) by mouth daily. 90 tablet 1  . chlorpheniramine (CHLOR-TRIMETON) 4 MG tablet Take 8 mg by mouth at bedtime.    . Cranberry 300 MG tablet Take 300 mg by mouth 2 (two) times daily.    . cyclobenzaprine (FLEXERIL) 10 MG tablet Take 1 tablet (10 mg total) by mouth 2 (two) times daily as needed for muscle spasms. 60 tablet 0  . estradiol (ESTRACE) 0.5 MG tablet TAKE 1 TABLET (0.5 MG TOTAL) BY MOUTH 2 (TWO) TIMES DAILY. 180 tablet 1  . fluticasone (FLONASE) 50 MCG/ACT nasal spray Place 2 sprays into both nostrils daily. 48 g 3  . furosemide (LASIX) 20 MG tablet Take 1 tablet (20 mg total) by mouth 3 (three) times daily as needed for fluid or edema. 90 tablet 2  . levothyroxine (SYNTHROID, LEVOTHROID) 112 MCG tablet Take 1 tablet (112 mcg total) by mouth daily. 30 tablet 3  . magic mouthwash SOLN Take 5 mLs by mouth 3 (three) times daily as needed for mouth pain. Components benadryl  525 mg, hydrocortisone 60 mg and nystatin 0.6 mg. 240 ml - Oral 240 mL 3  . montelukast (SINGULAIR) 10 MG tablet Take 1 tablet (10 mg total) by mouth at bedtime. 90 tablet 2  . Multiple Vitamin (MULTIVITAMIN PO) Take 1 tablet by mouth every morning.     . nystatin (MYCOSTATIN) 100000 UNIT/ML suspension     .  ondansetron (ZOFRAN) 8 MG  tablet Take 1 tablet (8 mg total) by mouth every 8 (eight) hours as needed for nausea or vomiting. 60 tablet 3  . ranitidine (ZANTAC) 300 MG tablet Take 1 tablet (300 mg total) by mouth at bedtime. 90 tablet 1  . RESTASIS 0.05 % ophthalmic emulsion PLACE ONE DROP INTO EACH EYE EVERY 12 HOURS  3  . ruxolitinib phosphate (JAKAFI) 20 MG tablet Take 1 tablet (20 mg total) by mouth 2 (two) times daily. 60 tablet 4  . venlafaxine XR (EFFEXOR-XR) 150 MG 24 hr capsule TAKE 1 CAPSULE (150 MG TOTAL) BY MOUTH DAILY WITH BREAKFAST. 90 capsule 1  . ALPRAZolam (XANAX) 1 MG tablet TAKE 1 TABLET 3 TIMES A DAY AS NEEDED FOR ANIXETY 90 tablet 1  . busPIRone (BUSPAR) 10 MG tablet Take 1 tablet (10 mg total) by mouth 3 (three) times daily. 90 tablet 0  . HYDROcodone-acetaminophen (NORCO) 7.5-325 MG tablet Take 1 tablet by mouth every 6 (six) hours as needed for moderate pain. 120 tablet 0  . meclizine (ANTIVERT) 12.5 MG tablet TAKE 1 TABLET THREE TIMES A DAY AS NEEDED FOR NAUSEA OR DIZZINESS 30 tablet 1  . silver sulfADIAZINE (SILVADENE) 1 % cream Apply 1 application topically daily. 50 g 1   No facility-administered medications prior to visit.     No Known Allergies  Review of Systems  Constitutional: Positive for malaise/fatigue. Negative for fever.  HENT: Negative for congestion.   Eyes: Negative for blurred vision.  Respiratory: Negative for shortness of breath.   Cardiovascular: Negative for chest pain, palpitations and leg swelling.  Gastrointestinal: Negative for abdominal pain, blood in stool and nausea.  Genitourinary: Negative for dysuria and frequency.  Musculoskeletal: Positive for back pain and joint pain. Negative for falls.  Skin: Negative for rash.  Neurological: Negative for dizziness, loss of consciousness and headaches.  Endo/Heme/Allergies: Negative for environmental allergies.  Psychiatric/Behavioral: Positive for depression. The patient is not nervous/anxious.         Objective:    Physical Exam  Constitutional: She is oriented to person, place, and time. She appears well-developed and well-nourished. No distress.  HENT:  Head: Normocephalic and atraumatic.  Nose: Nose normal.  Eyes: Right eye exhibits no discharge. Left eye exhibits no discharge.  Neck: Normal range of motion. Neck supple.  Cardiovascular: Normal rate and regular rhythm.  No murmur heard. Pulmonary/Chest: Effort normal and breath sounds normal.  Abdominal: Soft. Bowel sounds are normal. There is no tenderness.  Musculoskeletal: She exhibits no edema.  Neurological: She is alert and oriented to person, place, and time.  Skin: Skin is warm and dry.  Psychiatric: She has a normal mood and affect.  Nursing note and vitals reviewed.   LMP 07/31/1990  Wt Readings from Last 3 Encounters:  02/22/18 152 lb (68.9 kg)  01/25/18 151 lb (68.5 kg)  12/27/17 150 lb 6.4 oz (68.2 kg)     Lab Results  Component Value Date   WBC 3.5 (L) 02/22/2018   HGB 11.6 02/22/2018   HCT 35.3 02/22/2018   PLT 491 (H) 02/22/2018   GLUCOSE 105 02/22/2018   CHOL 250 (H) 08/10/2017   TRIG 313.0 (H) 08/10/2017   HDL 56.30 08/10/2017   LDLDIRECT 123.0 08/10/2017   LDLCALC 82 02/12/2017   ALT 24 02/22/2018   AST 34 02/22/2018   NA 134 02/22/2018   K 3.4 02/22/2018   CL 100 02/22/2018   CREATININE 1.00 02/22/2018   BUN 16 02/22/2018   CO2 31 02/22/2018   TSH  0.112 (L) 12/27/2017   INR 0.95 11/16/2017   HGBA1C 5.0 08/10/2017    Lab Results  Component Value Date   TSH 0.112 (L) 12/27/2017   Lab Results  Component Value Date   WBC 3.5 (L) 02/22/2018   HGB 11.6 02/22/2018   HCT 35.3 02/22/2018   MCV 100.9 02/22/2018   PLT 491 (H) 02/22/2018   Lab Results  Component Value Date   NA 134 02/22/2018   K 3.4 02/22/2018   CHLORIDE 105 11/29/2016   CO2 31 02/22/2018   GLUCOSE 105 02/22/2018   BUN 16 02/22/2018   CREATININE 1.00 02/22/2018   BILITOT 0.4 02/22/2018   ALKPHOS 94  (H) 02/22/2018   AST 34 02/22/2018   ALT 24 02/22/2018   PROT 7.3 02/22/2018   ALBUMIN 3.8 02/22/2018   CALCIUM 8.8 02/22/2018   ANIONGAP 3 (L) 02/22/2018   EGFR 80 (L) 11/29/2016   GFR 86.26 08/10/2017   Lab Results  Component Value Date   CHOL 250 (H) 08/10/2017   Lab Results  Component Value Date   HDL 56.30 08/10/2017   Lab Results  Component Value Date   LDLCALC 82 02/12/2017   Lab Results  Component Value Date   TRIG 313.0 (H) 08/10/2017   Lab Results  Component Value Date   CHOLHDL 4 08/10/2017   Lab Results  Component Value Date   HGBA1C 5.0 08/10/2017       Assessment & Plan:   Problem List Items Addressed This Visit    Hypothyroid    On Levothyroxine, continue to monitor      Generalized OA    Struggles with daily pain and manages with Norco but it has worked as well lately so we will try a course of Percocet and she is encouraged to stay as active as possible and use pain meds as little as possible      Relevant Medications   oxyCODONE-acetaminophen (PERCOCET) 7.5-325 MG tablet   Weight loss    Has noted some recent weight gain and she is eating some better. Encouraged to ely on protein and veg with plenty of water.       Macrocytic anemia    Follows with hematology. Increase leafy greens, consider increased lean red meat and using cast iron cookware. 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 Encouraged heart healthy diet such as the DASH diet and exercise as tolerated.       Relevant Medications   meclizine (ANTIVERT) 12.5 MG tablet   Hypomagnesemia - Primary    Osteoarthritis   Relevant Medications   oxyCODONE-acetaminophen (PERCOCET) 7.5-325 MG tablet   Chronic back pain   Relevant Medications   oxyCODONE-acetaminophen (PERCOCET) 7.5-325 MG tablet      I have discontinued Rachael Johnson's HYDROcodone-acetaminophen. I have also changed her ALPRAZolam and meclizine. Additionally, I am having her start on oxyCODONE-acetaminophen. Lastly, I am having her maintain her Multiple Vitamin (MULTIVITAMIN PO), aspirin EC, Cranberry, fluticasone, ranitidine, chlorpheniramine, RESTASIS, estradiol, venlafaxine XR, cetirizine, ruxolitinib phosphate, levothyroxine, furosemide, montelukast, ondansetron, budesonide-formoterol, albuterol, magic mouthwash, nystatin, cyclobenzaprine, busPIRone, and  silver sulfADIAZINE.  Meds ordered this encounter  Medications  . busPIRone (BUSPAR) 10 MG tablet    Sig: Take 1 tablet (10 mg total) by mouth 3 (three) times daily.    Dispense:  90 tablet    Refill:  0  . ALPRAZolam (XANAX) 1 MG tablet    Sig: Take 0.5-1 tablets (0.5-1 mg total) by mouth 3 (three) times daily as needed for anxiety. TAKE 1 TABLET 3 TIMES A DAY AS NEEDED FOR ANIXETY    Dispense:  90 tablet    Refill:  1    Not to exceed 4 additional fills before 11/20/2017  . oxyCODONE-acetaminophen (PERCOCET) 7.5-325 MG tablet    Sig: Take 1 tablet by mouth every 4 (four) hours as needed for severe pain.    Dispense:  120 tablet    Refill:  0  . meclizine (ANTIVERT) 12.5 MG tablet    Sig: Take 1 tablet (12.5 mg total) by mouth 3 (three) times daily as needed for dizziness.    Dispense:  40 tablet    Refill:  2  . silver sulfADIAZINE (SILVADENE) 1 % cream    Sig: Apply 1 application topically daily.    Dispense:  50 g    Refill:  1     Penni Homans, MD

## 2018-03-07 ENCOUNTER — Encounter: Payer: Self-pay | Admitting: Hematology & Oncology

## 2018-03-22 ENCOUNTER — Encounter: Payer: Self-pay | Admitting: Hematology & Oncology

## 2018-03-22 ENCOUNTER — Inpatient Hospital Stay: Payer: Medicare Other

## 2018-03-22 ENCOUNTER — Inpatient Hospital Stay: Payer: Medicare Other | Attending: Hematology & Oncology | Admitting: Hematology & Oncology

## 2018-03-22 ENCOUNTER — Other Ambulatory Visit: Payer: Self-pay

## 2018-03-22 VITALS — BP 124/78 | HR 87 | Temp 98.0°F | Resp 18 | Wt 151.0 lb

## 2018-03-22 DIAGNOSIS — D509 Iron deficiency anemia, unspecified: Secondary | ICD-10-CM | POA: Diagnosis not present

## 2018-03-22 DIAGNOSIS — Z7982 Long term (current) use of aspirin: Secondary | ICD-10-CM | POA: Diagnosis not present

## 2018-03-22 DIAGNOSIS — M791 Myalgia, unspecified site: Secondary | ICD-10-CM

## 2018-03-22 DIAGNOSIS — Z79899 Other long term (current) drug therapy: Secondary | ICD-10-CM | POA: Diagnosis not present

## 2018-03-22 DIAGNOSIS — D500 Iron deficiency anemia secondary to blood loss (chronic): Secondary | ICD-10-CM

## 2018-03-22 DIAGNOSIS — R109 Unspecified abdominal pain: Secondary | ICD-10-CM | POA: Diagnosis not present

## 2018-03-22 DIAGNOSIS — D7581 Myelofibrosis: Secondary | ICD-10-CM | POA: Diagnosis not present

## 2018-03-22 DIAGNOSIS — R5383 Other fatigue: Secondary | ICD-10-CM

## 2018-03-22 DIAGNOSIS — M2550 Pain in unspecified joint: Secondary | ICD-10-CM | POA: Diagnosis not present

## 2018-03-22 DIAGNOSIS — D473 Essential (hemorrhagic) thrombocythemia: Secondary | ICD-10-CM

## 2018-03-22 DIAGNOSIS — R42 Dizziness and giddiness: Secondary | ICD-10-CM

## 2018-03-22 DIAGNOSIS — J040 Acute laryngitis: Secondary | ICD-10-CM | POA: Diagnosis not present

## 2018-03-22 DIAGNOSIS — R0602 Shortness of breath: Secondary | ICD-10-CM

## 2018-03-22 DIAGNOSIS — R61 Generalized hyperhidrosis: Secondary | ICD-10-CM | POA: Diagnosis not present

## 2018-03-22 LAB — CMP (CANCER CENTER ONLY)
ALT: 19 U/L (ref 10–47)
AST: 34 U/L (ref 11–38)
Albumin: 3.9 g/dL (ref 3.5–5.0)
Alkaline Phosphatase: 90 U/L — ABNORMAL HIGH (ref 26–84)
Anion gap: 7 (ref 5–15)
BUN: 17 mg/dL (ref 7–22)
CO2: 31 mmol/L (ref 18–33)
Calcium: 9 mg/dL (ref 8.0–10.3)
Chloride: 102 mmol/L (ref 98–108)
Creatinine: 1.1 mg/dL (ref 0.60–1.20)
Glucose, Bld: 99 mg/dL (ref 73–118)
Potassium: 4.1 mmol/L (ref 3.3–4.7)
Sodium: 140 mmol/L (ref 128–145)
Total Bilirubin: 0.5 mg/dL (ref 0.2–1.6)
Total Protein: 7.7 g/dL (ref 6.4–8.1)

## 2018-03-22 LAB — CBC WITH DIFFERENTIAL (CANCER CENTER ONLY)
Basophils Absolute: 0 10*3/uL (ref 0.0–0.1)
Basophils Relative: 0 %
Eosinophils Absolute: 0.1 10*3/uL (ref 0.0–0.5)
Eosinophils Relative: 2 %
HCT: 34.2 % — ABNORMAL LOW (ref 34.8–46.6)
Hemoglobin: 11 g/dL — ABNORMAL LOW (ref 11.6–15.9)
Lymphocytes Relative: 27 %
Lymphs Abs: 1.5 10*3/uL (ref 0.9–3.3)
MCH: 33.4 pg (ref 26.0–34.0)
MCHC: 32.2 g/dL (ref 32.0–36.0)
MCV: 104 fL — ABNORMAL HIGH (ref 81.0–101.0)
Monocytes Absolute: 0.4 10*3/uL (ref 0.1–0.9)
Monocytes Relative: 8 %
Neutro Abs: 3.6 10*3/uL (ref 1.5–6.5)
Neutrophils Relative %: 63 %
Platelet Count: 603 10*3/uL — ABNORMAL HIGH (ref 145–400)
RBC: 3.29 MIL/uL — ABNORMAL LOW (ref 3.70–5.32)
RDW: 14.9 % (ref 11.1–15.7)
WBC Count: 5.7 10*3/uL (ref 3.9–10.0)

## 2018-03-22 LAB — LACTATE DEHYDROGENASE: LDH: 272 U/L — ABNORMAL HIGH (ref 98–192)

## 2018-03-22 LAB — RETICULOCYTES
RBC.: 3.34 MIL/uL — ABNORMAL LOW (ref 3.70–5.45)
Retic Count, Absolute: 73.5 10*3/uL (ref 33.7–90.7)
Retic Ct Pct: 2.2 % — ABNORMAL HIGH (ref 0.7–2.1)

## 2018-03-22 MED ORDER — RUXOLITINIB PHOSPHATE 20 MG PO TABS: 20.0000 mg | ORAL_TABLET | Freq: Two times a day (BID) | ORAL | 4 refills | Status: DC

## 2018-03-22 MED ORDER — ANAGRELIDE HCL 1 MG PO CAPS: 1.0000 mg | ORAL_CAPSULE | Freq: Every day | ORAL | 3 refills | Status: DC

## 2018-03-22 MED FILL — ANAGRELIDE HCL 1 MG CAPSULE: 1 | 30 days supply | Qty: 30 | Fill #0

## 2018-03-22 NOTE — Progress Notes (Signed)
Hematology and Oncology Follow Up Visit  Rachael Johnson [id number] 26-Jun-1951 67 y.o. 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  I just wish I had a good answer as to why this is.  We will restart the anagrelide.  I will start on 1 mg daily along with the Jakafi.  She otherwise is doing okay.  She is had no cough.  She is a little bit of a headache.  She is had no bleeding.  Is been no rashes.  There is been no change in bowel or bladder habits.  Para she has had a problem with some laryngitis.  She has had a little bit of bronchitis.  There is been no productive cough.  Currently, her performance status is ECOG 1.  Medications:  Allergies as of 03/22/2018   No Known Allergies     Medication List        Accurate as of 03/22/18  1:48 PM. Always use your most recent med list.          albuterol 108 (90 Base) MCG/ACT inhaler Commonly known as:  PROVENTIL HFA;VENTOLIN HFA Inhale 2 puffs into the lungs every 6 (six) hours as needed for wheezing or shortness of breath.   ALPRAZolam 1 MG tablet Commonly known as:  XANAX Take 0.5-1 tablets (0.5-1 mg total) by mouth 3 (three) times daily as needed for anxiety. TAKE 1 TABLET 3 TIMES A DAY AS NEEDED FOR ANIXETY   aspirin EC 81 MG tablet Take 81 mg by mouth every morning.   budesonide-formoterol 80-4.5 MCG/ACT inhaler Commonly known as:  SYMBICORT Inhale 2 puffs into the lungs 2 (two) times daily.   busPIRone 10 MG tablet Commonly known as:  BUSPAR Take 1 tablet (10 mg total) by mouth  3 (three) times daily.   cetirizine 10 MG tablet Commonly known as:  ZYRTEC Take 1 tablet (10 mg total) by mouth daily.   chlorpheniramine 4 MG tablet Commonly known as:  CHLOR-TRIMETON Take 8 mg by mouth at bedtime.   Cranberry 300 MG tablet Take 300 mg by mouth 2 (two) times daily.   cyclobenzaprine 10 MG tablet Commonly known as:  FLEXERIL Take 1 tablet (10 mg total) by mouth 2 (two) times daily as needed for muscle spasms.   estradiol 0.5 MG tablet Commonly known as:  ESTRACE TAKE 1 TABLET (0.5 MG TOTAL) BY MOUTH 2 (TWO) TIMES DAILY.   fluticasone 50 MCG/ACT nasal spray Commonly known as:  FLONASE Place 2 sprays into both nostrils daily.   furosemide 20 MG tablet Commonly known as:  LASIX Take 1 tablet (20 mg total) by mouth 3 (three) times daily as needed for fluid or edema.   levothyroxine 112 MCG tablet Commonly known as:  SYNTHROID, LEVOTHROID Take 1 tablet (112 mcg total) by mouth daily.   magic mouthwash Soln Take 5 mLs by mouth 3 (three) times daily as needed for mouth pain. Components benadryl  525 mg, hydrocortisone 60 mg and nystatin 0.6 mg. 240 ml - Oral   meclizine 12.5 MG tablet Commonly known as:  ANTIVERT Take 1 tablet (12.5 mg total) by mouth 3 (three) times daily as needed for dizziness.   montelukast 10 MG tablet Commonly known as:  SINGULAIR Take 1 tablet (10 mg total) by mouth at bedtime.   MULTIVITAMIN PO Take 1 tablet by mouth every morning.   nystatin 100000 UNIT/ML suspension Commonly known as:  MYCOSTATIN   ondansetron 8 MG tablet Commonly known as:  ZOFRAN Take 1 tablet (8 mg total) by mouth every 8 (eight) hours as needed for nausea or vomiting.   oxyCODONE-acetaminophen 7.5-325 MG tablet Commonly known as:  PERCOCET Take 1 tablet by mouth every 4 (four) hours as needed for severe pain.   ranitidine 300 MG tablet Commonly known as:  ZANTAC Take 1 tablet (300 mg total) by mouth at bedtime.   RESTASIS 0.05 % ophthalmic  emulsion Generic drug:  cycloSPORINE PLACE ONE DROP INTO EACH EYE EVERY 12 HOURS   ruxolitinib phosphate 20 MG tablet Commonly known as:  JAKAFI Take 1 tablet (20 mg total) by mouth 2 (two) times daily.   silver sulfADIAZINE 1 % cream Commonly known as:  SILVADENE Apply 1 application topically daily.   venlafaxine XR 150 MG 24 hr capsule Commonly known as:  EFFEXOR-XR TAKE 1 CAPSULE (150 MG TOTAL) BY MOUTH DAILY WITH BREAKFAST.       Allergies: No Known Allergies  Past Medical History, Surgical history, Social history, and Family History were reviewed and updated.  Review of Systems: Review of Systems  Constitutional: Positive for diaphoresis and malaise/fatigue.  HENT: Negative.   Eyes: Negative.   Respiratory: Positive for shortness of breath.   Cardiovascular: Negative.   Gastrointestinal: Positive for abdominal pain, heartburn and nausea.  Genitourinary: Negative.   Musculoskeletal: Positive for joint pain and myalgias.  Skin: Negative.   Neurological: Positive for dizziness.  Endo/Heme/Allergies: Negative.   Psychiatric/Behavioral: Negative.      Physical Exam:  weight is 151 lb (68.5 kg). Her oral temperature is 98 F (36.7 C). Her blood pressure is 124/78 and her pulse is 87. Her respiration is 18 and oxygen saturation is 100%.   Wt Readings from Last 3 Encounters:  03/22/18 151 lb (68.5 kg)  02/22/18 152 lb (68.9 kg)  01/25/18 151 lb (68.5 kg)    Physical Exam  Constitutional: She is oriented to person, place, and time.  HENT:  Head: Normocephalic and atraumatic.  Mouth/Throat: Oropharynx is clear and moist.  Eyes: Pupils are equal, round, and reactive to light. EOM are normal.  Neck: Normal range of motion.  Cardiovascular: Normal rate, regular rhythm and normal heart sounds.  Pulmonary/Chest: Effort normal and breath sounds normal.  Abdominal: Soft. Bowel sounds are normal.  Her spleen tip is palpable a couple centimeters below the left costal  margin.  Musculoskeletal: Normal range of motion. She exhibits no edema, tenderness or deformity.  Lymphadenopathy:    She has no cervical adenopathy.  Neurological: She is alert and oriented to person, place, and time.  Skin: Skin is warm and dry. No rash noted. No erythema.  Psychiatric: She has a normal mood and affect. Her behavior is normal. Judgment and thought content normal.  Vitals reviewed.    Lab Results  Component Value Date   WBC 5.7 03/22/2018   HGB 11.0 (L) 03/22/2018   HCT 34.2 (L) 03/22/2018   MCV 104.0 (H) 03/22/2018   PLT 603 (H) 03/22/2018   Lab Results  Component Value Date   FERRITIN 1,638 (H) 02/22/2018   IRON 220 (H) 02/22/2018  TIBC 238 02/22/2018   UIBC 17 02/22/2018   IRONPCTSAT 93 (H) 02/22/2018   Lab Results  Component Value Date   RETICCTPCT 1.6 11/29/2017   RBC 3.29 (L) 03/22/2018   RETICCTABS 45.8 06/02/2015   No results found for: KPAFRELGTCHN, LAMBDASER, KAPLAMBRATIO No results found for: IGGSERUM, IGA, IGMSERUM No results found for: Odetta Pink, SPEI   Chemistry      Component Value Date/Time   NA 140 03/22/2018 1308   NA 143 07/16/2017 1057   NA 140 11/29/2016 0936   K 4.1 03/22/2018 1308   K 3.2 (L) 07/16/2017 1057   K 4.2 11/29/2016 0936   CL 102 03/22/2018 1308   CL 104 07/16/2017 1057   CO2 31 03/22/2018 1308   CO2 25 07/16/2017 1057   CO2 27 11/29/2016 0936   BUN 17 03/22/2018 1308   BUN 10 07/16/2017 1057   BUN 18.3 11/29/2016 0936   CREATININE 1.10 03/22/2018 1308   CREATININE 0.8 07/16/2017 1057   CREATININE 0.8 11/29/2016 0936      Component Value Date/Time   CALCIUM 9.0 03/22/2018 1308   CALCIUM 8.9 07/16/2017 1057   CALCIUM 8.9 11/29/2016 0936   ALKPHOS 90 (H) 03/22/2018 1308   ALKPHOS 71 07/16/2017 1057   ALKPHOS 77 11/29/2016 0936   AST 34 03/22/2018 1308   AST 19 11/29/2016 0936   ALT 19 03/22/2018 1308   ALT 27 07/16/2017 1057   ALT 10  11/29/2016 0936   BILITOT 0.5 03/22/2018 1308   BILITOT <0.22 11/29/2016 0936      Impression and Plan: Rachael Johnson is a very pleasant 67 yo caucasian female with essential thrombocythemia.   We will see how the anagrelide will do with the [facility].  Hopefully, her platelet count will be controlled a little bit better.  I will plan to see her back in another 3 or 4 weeks.  As always, we had a long talk about her family and what is going on.  It is somewhat difficult to hear what has happened.  I am praying hard for her.  As always, I spent 35 minutes with her.  All the time spent face-to-face with counseling and coordinating care.  [name], MD 8/23/20191:48 PM

## 2018-03-25 ENCOUNTER — Other Ambulatory Visit: Payer: Self-pay | Admitting: Family Medicine

## 2018-03-25 LAB — IRON AND TIBC
Iron: 144 ug/dL — ABNORMAL HIGH (ref 41–142)
Saturation Ratios: 57 % (ref 21–57)
TIBC: 254 ug/dL (ref 236–444)
UIBC: 109 ug/dL

## 2018-03-25 LAB — FERRITIN: Ferritin: 1155 ng/mL — ABNORMAL HIGH (ref 11–307)

## 2018-03-25 MED FILL — JAKAFI 20 MG TABLET: 20 | 30 days supply | Qty: 60 | Fill #0

## 2018-03-27 ENCOUNTER — Telehealth: Payer: Self-pay

## 2018-03-27 MED ORDER — ESTRADIOL 0.5 MG PO TABS: 0.5000 mg | ORAL_TABLET | Freq: Two times a day (BID) | ORAL | 1 refills | Status: DC

## 2018-03-27 NOTE — Telephone Encounter (Signed)
Received refill request for estradiol via fax; refilled per protocol.

## 2018-03-31 ENCOUNTER — Other Ambulatory Visit: Payer: Self-pay | Admitting: Family Medicine

## 2018-03-31 DIAGNOSIS — M1990 Unspecified osteoarthritis, unspecified site: Secondary | ICD-10-CM

## 2018-03-31 DIAGNOSIS — G8929 Other chronic pain: Secondary | ICD-10-CM

## 2018-03-31 DIAGNOSIS — M549 Dorsalgia, unspecified: Secondary | ICD-10-CM

## 2018-04-02 ENCOUNTER — Other Ambulatory Visit: Payer: Self-pay | Admitting: Family Medicine

## 2018-04-02 DIAGNOSIS — G8929 Other chronic pain: Secondary | ICD-10-CM

## 2018-04-02 DIAGNOSIS — M549 Dorsalgia, unspecified: Secondary | ICD-10-CM

## 2018-04-02 DIAGNOSIS — M1990 Unspecified osteoarthritis, unspecified site: Secondary | ICD-10-CM

## 2018-04-02 MED ORDER — CYCLOBENZAPRINE HCL 10 MG PO TABS: 10.0000 mg | ORAL_TABLET | Freq: Two times a day (BID) | ORAL | 0 refills | Status: DC | PRN

## 2018-04-02 MED ORDER — OXYCODONE-ACETAMINOPHEN 7.5-325 MG PO TABS: 1.0000 | ORAL_TABLET | ORAL | 0 refills | Status: DC | PRN

## 2018-04-02 NOTE — Telephone Encounter (Signed)
Refill Request: Percocet   Last RX:03/04/18 Last OV:03/04/18 Next OV:05/06/18 UDS:12/27/17 CSC:12/22/16 CSR:

## 2018-04-03 ENCOUNTER — Other Ambulatory Visit: Payer: Self-pay | Admitting: Family Medicine

## 2018-04-03 ENCOUNTER — Telehealth: Payer: Self-pay | Admitting: Family Medicine

## 2018-04-03 DIAGNOSIS — G8929 Other chronic pain: Secondary | ICD-10-CM

## 2018-04-03 DIAGNOSIS — I10 Essential (primary) hypertension: Secondary | ICD-10-CM

## 2018-04-03 DIAGNOSIS — M549 Dorsalgia, unspecified: Secondary | ICD-10-CM

## 2018-04-03 DIAGNOSIS — E559 Vitamin D deficiency, unspecified: Secondary | ICD-10-CM

## 2018-04-03 DIAGNOSIS — R739 Hyperglycemia, unspecified: Secondary | ICD-10-CM

## 2018-04-03 DIAGNOSIS — M1990 Unspecified osteoarthritis, unspecified site: Secondary | ICD-10-CM

## 2018-04-03 DIAGNOSIS — R300 Dysuria: Secondary | ICD-10-CM

## 2018-04-03 NOTE — Telephone Encounter (Signed)
Medication was sent to the pharmacy  °

## 2018-04-03 NOTE — Telephone Encounter (Signed)
Copied from [place] [phone]. Topic: General - Other >> Apr 03, 2018  8:13 AM Lennox Solders wrote: reason for CRM: pt is calling and would like to know why percocet was denied. [facility] in [place]. Pt saw dr Charlett Blake on 03-04-18. Pt would like a callback

## 2018-04-15 MED ORDER — FUROSEMIDE 20 MG PO TABS: 20.0000 mg | ORAL_TABLET | Freq: Three times a day (TID) | ORAL | 2 refills | Status: DC | PRN

## 2018-04-15 NOTE — Addendum Note (Signed)
Addended by: Magdalene Molly A on: 04/15/2018 11:06 AM   Modules accepted: Orders

## 2018-04-18 MED FILL — ANAGRELIDE HCL 1 MG CAPSULE: 1 | 30 days supply | Qty: 30 | Fill #1

## 2018-04-18 MED FILL — JAKAFI 20 MG TABLET: 20 | 30 days supply | Qty: 60 | Fill #4

## 2018-04-24 ENCOUNTER — Other Ambulatory Visit: Payer: Self-pay | Admitting: Family Medicine

## 2018-04-24 DIAGNOSIS — M1990 Unspecified osteoarthritis, unspecified site: Secondary | ICD-10-CM

## 2018-04-24 DIAGNOSIS — G8929 Other chronic pain: Secondary | ICD-10-CM

## 2018-04-24 DIAGNOSIS — M549 Dorsalgia, unspecified: Secondary | ICD-10-CM

## 2018-04-24 NOTE — Telephone Encounter (Signed)
Copied from [place] [phone]. Topic: Quick Communication - Rx Refill/Question >> Apr 24, 2018  9:47 AM Scherrie Gerlach wrote: Medication: oxyCODONE-acetaminophen (PERCOCET) 7.5-325 MG tablet  Pt called to follow up on something and while I had her on the phone she requested a refill on this med. Pt knows not due until 10/03, (next Friday) but thought she would let the dr know so maybe she could call it in to be picked up that day. [facility] [address], [place] - [place] [address] [phone] (Phone) [phone] (Fax)  Last OV  03/04/18

## 2018-04-25 ENCOUNTER — Encounter (INDEPENDENT_AMBULATORY_CARE_PROVIDER_SITE_OTHER): Payer: Self-pay | Admitting: Orthopedic Surgery

## 2018-04-25 ENCOUNTER — Ambulatory Visit (INDEPENDENT_AMBULATORY_CARE_PROVIDER_SITE_OTHER): Payer: Medicare Other | Admitting: Orthopedic Surgery

## 2018-04-25 DIAGNOSIS — B351 Tinea unguium: Secondary | ICD-10-CM | POA: Diagnosis not present

## 2018-04-25 MED ORDER — OXYCODONE-ACETAMINOPHEN 7.5-325 MG PO TABS: 1.0000 | ORAL_TABLET | ORAL | 0 refills | Status: DC | PRN

## 2018-04-25 NOTE — Telephone Encounter (Signed)
Requesting:oxycodone Contract:no VWA:[id number] risk next screen 12/10/17 Last OV:03/04/18 Next OV:05/06/18 Last Refill:04/02/18 #[id number] Database:   Please advise

## 2018-04-25 NOTE — Progress Notes (Signed)
Office Visit Note   Patient: Rachael Johnson           Date of Birth: 03/21/1951           MRN: [id number] Visit Date: 04/25/2018 Requested by: Mosie Lukes, MD [address] [place], [place] [address] PCP: Mosie Lukes, MD  Subjective: Chief Complaint  Patient presents with  . Left Foot - Pain  . Right Foot - Pain    HPI: Rachael Johnson is a patient with bilateral great toenail pain.  She states that both toenails are loose and painful.  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  Toenails became loose after changing these agents.  She reports it is painful for her to walk.  The left hurts more than the right.  She denies any drainage from the toenails but does report pain with ambulation localizing to both great toenails.  Denies any looseness of the other toenails.              ROS: All systems reviewed are negative as they relate to the chief complaint within the history of present illness.  Patient denies  fevers or chills.   Assessment & Plan: Visit Diagnoses:  1. Fungal infection of toenail     Plan: Impression is fungal infection both great toe toenails with some looseness of both nail plates.  I think this loose this is contributing to her pain and symptoms.  Both toenails are removed today.  After sterile prep and drape and numbing of the toes with lidocaine plain 10 cc each the area is prepped with alcohol and Betadine and then the toenail is removed using a combination of a freer elevator and a grasper.  Both toenails are dressed.  We will see her back as needed.  Follow-Up Instructions: Return if symptoms worsen or fail to improve.   Orders:  No orders of the defined types were placed in this encounter.  No orders of the defined types were placed in this encounter.     Procedures: No procedures performed   Clinical Data: No additional findings.  Objective: Vital Signs: LMP 07/31/1990   Physical Exam:    Constitutional: Patient appears well-developed HEENT:  Head: Normocephalic Eyes:EOM are normal Neck: Normal range of motion Cardiovascular: Normal rate Pulmonary/chest: Effort normal Neurologic: Patient is alert Skin: Skin is warm Psychiatric: Patient has normal mood and affect    Ortho Exam: Ortho exam demonstrates palpable pedal pulses.  She has good ankle dorsiflexion plantarflexion quad hamstring strength good sensation in the dorsal plantar aspect of the foot.  The left toenail is definitely loose to palpation the right one also is slightly loose but not as much as the left.  Both toenails are tender to palpation.  No evidence of erythema infection or drainage around the toenail regions.  Specialty Comments:  No specialty comments available.  Imaging: No results found.   PMFS History: Patient Active Problem List   Diagnosis Date Noted  . 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 Sinusitis 11/05/2017  . Chronic back pain 09/09/2017  . Osteoarthritis 05/24/2017  . Iron deficiency anemia due to chronic blood loss 04/03/2017  . Hypomagnesemia 03/18/2017  . Shoulder wound, right, sequela 03/14/2017  . LFTs abnormal 03/13/2017  . Hypernatremia 03/13/2017  . Elevated LFTs   . SOB (shortness of breath) 01/23/2017  . Lung nodule 12/18/2016  . Bronchiectasis assoc with MPNs 11/28/2016  . Cough variant asthma vs UACS 11/27/2016  .  Vitamin D deficiency 03/07/2016  . Dysuria 03/07/2016  . Essential hypertension 03/07/2016  . Benign paroxysmal positional vertigo 03/07/2016  . Hyperglycemia 12/17/2015  . Cataract 12/21/2014  . Restless leg syndrome 09/30/2014  . Constipation 11/24/2013  . Preventative health care 04/20/2013  . Hyperlipidemia 02/25/2013  . H. pylori infection 12/19/2012  . Umbilical hernia 97/08/6376  . Macrocytic anemia 07/08/2012  . Weight loss 12/29/2011  . Depression with anxiety 03/24/2011  . 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 Hypothyroid 02/06/2011   . Essential thrombocythemia ([place]) 01/24/2011   Past Medical History:  Diagnosis Date  . Acute renal insufficiency 04/09/2017  . Anemia, iron deficiency 07/08/2012  . Arthritis   . Cataract 12/21/2014  . Depression with anxiety 03/24/2011  . Diverticulitis   . Esophageal reflux 08/17/2013  . Essential thrombocythemia ([place]) 01/24/2011  . Frequent episodic tension-type headache   . Gastroenteritis 12/21/2014  . 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 GERD (gastroesophageal reflux disease)   . H. pylori infection 12/19/2012  . Hyperglycemia 12/17/2015  . Hypertension   . Iron deficiency anemia due to chronic blood loss 04/03/2017  . Osteoarthritis 05/24/2017  . Other and unspecified hyperlipidemia 02/25/2013  . Restless leg syndrome 09/30/2014  . Rhabdomyolysis 02/2017  . Scabies 03/28/2015  . 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 Seizure ([place])    childhood  . Shoulder wound, right, sequela 03/14/2017  . Thyroid disease     Family History  Problem Relation Age of Onset  . Arthritis Mother   . Cancer Mother        ovarian  . Hypertension Mother   . Heart disease Mother        pacer  . Heart failure Mother   . Arthritis Father   . Cancer Father        lung  . Hypertension Father   . Cancer Sister        lung  . Cancer Brother        prostate  . Cancer Brother        lung  . Hypertension Son   . COPD Brother   . Heart disease Brother        died from CHF  . Hypertension Brother   . Cancer Brother        colon  . Alcohol abuse Brother   . Cirrhosis Brother   . Seizures Sister   . Stroke Sister   . Arthritis Brother     Past Surgical History:  Procedure Laterality Date  . ABDOMINAL HYSTERECTOMY     1992  . BREAST CYST ASPIRATION    . BREAST SURGERY  1992   biopsy, benign. Fibrocystic  . UMBILICAL HERNIA REPAIR N/A 09/25/2012   Procedure: HERNIA REPAIR UMBILICAL ADULT;  Surgeon: Harl Bowie, MD;  Location: [facility];  Service: General;  Laterality: N/A;   Social History    Occupational History  . Occupation: APL IT sales professional: [employer]    Comment: [employer]  Tobacco Use  . Smoking status: Former Smoker    Packs/day: 3.50    Years: 20.00    Pack years: 70.00    Types: Cigarettes    Start date: 11/15/1970    Last attempt to quit: 07/31/1990    Years since quitting: 27.7  . Smokeless tobacco: Never Used  . Tobacco comment: quit 23 years ago  Substance and Sexual Activity  . Alcohol use: No    Alcohol/week: 0.0 standard drinks  . Drug use: No  . Sexual activity:  Never

## 2018-04-26 ENCOUNTER — Other Ambulatory Visit: Payer: Self-pay

## 2018-04-26 ENCOUNTER — Inpatient Hospital Stay: Payer: Medicare Other | Attending: Hematology & Oncology | Admitting: Hematology & Oncology

## 2018-04-26 ENCOUNTER — Inpatient Hospital Stay: Payer: Medicare Other

## 2018-04-26 ENCOUNTER — Encounter: Payer: Self-pay | Admitting: Hematology & Oncology

## 2018-04-26 ENCOUNTER — Other Ambulatory Visit: Payer: Self-pay | Admitting: Family Medicine

## 2018-04-26 VITALS — BP 125/73 | HR 91 | Temp 98.7°F | Resp 18 | Wt 152.0 lb

## 2018-04-26 DIAGNOSIS — F418 Other specified anxiety disorders: Secondary | ICD-10-CM

## 2018-04-26 DIAGNOSIS — G8929 Other chronic pain: Secondary | ICD-10-CM

## 2018-04-26 DIAGNOSIS — D473 Essential (hemorrhagic) thrombocythemia: Secondary | ICD-10-CM | POA: Diagnosis not present

## 2018-04-26 DIAGNOSIS — Z79899 Other long term (current) drug therapy: Secondary | ICD-10-CM | POA: Insufficient documentation

## 2018-04-26 DIAGNOSIS — D509 Iron deficiency anemia, unspecified: Secondary | ICD-10-CM | POA: Diagnosis not present

## 2018-04-26 DIAGNOSIS — D7581 Myelofibrosis: Secondary | ICD-10-CM | POA: Insufficient documentation

## 2018-04-26 DIAGNOSIS — Z7982 Long term (current) use of aspirin: Secondary | ICD-10-CM | POA: Insufficient documentation

## 2018-04-26 DIAGNOSIS — M549 Dorsalgia, unspecified: Principal | ICD-10-CM

## 2018-04-26 DIAGNOSIS — D500 Iron deficiency anemia secondary to blood loss (chronic): Secondary | ICD-10-CM

## 2018-04-26 LAB — CMP (CANCER CENTER ONLY)
ALT: 29 U/L (ref 10–47)
AST: 37 U/L (ref 11–38)
Albumin: 3.8 g/dL (ref 3.5–5.0)
Alkaline Phosphatase: 85 U/L — ABNORMAL HIGH (ref 26–84)
Anion gap: 0 — ABNORMAL LOW (ref 5–15)
BUN: 12 mg/dL (ref 7–22)
CO2: 31 mmol/L (ref 18–33)
Calcium: 8.9 mg/dL (ref 8.0–10.3)
Chloride: 108 mmol/L (ref 98–108)
Creatinine: 1.1 mg/dL (ref 0.60–1.20)
Glucose, Bld: 83 mg/dL (ref 73–118)
Potassium: 3.3 mmol/L (ref 3.3–4.7)
Sodium: 139 mmol/L (ref 128–145)
Total Bilirubin: 0.4 mg/dL (ref 0.2–1.6)
Total Protein: 7.2 g/dL (ref 6.4–8.1)

## 2018-04-26 LAB — CBC WITH DIFFERENTIAL (CANCER CENTER ONLY)
Basophils Absolute: 0 10*3/uL (ref 0.0–0.1)
Basophils Relative: 0 %
Eosinophils Absolute: 0.1 10*3/uL (ref 0.0–0.5)
Eosinophils Relative: 2 %
HCT: 32.4 % — ABNORMAL LOW (ref 34.8–46.6)
Hemoglobin: 10.3 g/dL — ABNORMAL LOW (ref 11.6–15.9)
Lymphocytes Relative: 13 %
Lymphs Abs: 0.8 10*3/uL — ABNORMAL LOW (ref 0.9–3.3)
MCH: 34.8 pg — ABNORMAL HIGH (ref 26.0–34.0)
MCHC: 31.8 g/dL — ABNORMAL LOW (ref 32.0–36.0)
MCV: 109.5 fL — ABNORMAL HIGH (ref 81.0–101.0)
Monocytes Absolute: 0.5 10*3/uL (ref 0.1–0.9)
Monocytes Relative: 9 %
Neutro Abs: 4.6 10*3/uL (ref 1.5–6.5)
Neutrophils Relative %: 76 %
Platelet Count: 599 10*3/uL — ABNORMAL HIGH (ref 145–400)
RBC: 2.96 MIL/uL — ABNORMAL LOW (ref 3.70–5.32)
RDW: 14.9 % (ref 11.1–15.7)
WBC Count: 6.1 10*3/uL (ref 3.9–10.0)

## 2018-04-26 LAB — SAVE SMEAR

## 2018-04-26 LAB — LACTATE DEHYDROGENASE: LDH: 238 U/L — ABNORMAL HIGH (ref 98–192)

## 2018-04-26 MED ORDER — ANAGRELIDE HCL 1 MG PO CAPS: 1.0000 mg | ORAL_CAPSULE | Freq: Two times a day (BID) | ORAL | 3 refills | Status: DC

## 2018-04-26 NOTE — Progress Notes (Signed)
Hematology and Oncology Follow Up Visit  Rachael Johnson [id number] 09/24/1950 67 y.o. 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  She is doing pretty well.  She just had her toenails taken off both feet.  These were the tendons on the big toe.  This was done yesterday.  I think she is tolerated the anagrelide and Jakafi quite well.  I suspect we probably will have to increase the anagrelide dose.  She had a platelet count of 600,000.  Some of this might be from her toenails being taken off.  However, I would like to drive her platelet count down to less than 400,000 if possible.  As such, I am going to increase her anagrelide to 1 mg p.o. twice daily.  She is had no nausea or vomiting.  She is not as fatigued.  She is had no bleeding.  She is had no fever.  Her appetite is good.  Her son wants her to move in with he and his family.  She just is not ready for this yet.  She is going to have to pray about this.  Overall, her performance status is ECOG 1.     Medications:  Allergies as of 04/26/2018   No Known Allergies     Medication List        Accurate as of 04/26/18  1:03 PM. Always use your most recent med list.          albuterol 108 (90 Base) MCG/ACT inhaler Commonly known as:  PROVENTIL HFA;VENTOLIN HFA Inhale 2 puffs into the lungs every 6 (six) hours as needed for wheezing or shortness of breath.   ALPRAZolam 1 MG tablet Commonly known as:  XANAX Take 0.5-1 tablets (0.5-1 mg total) by mouth 3 (three) times daily as needed for anxiety. TAKE 1 TABLET  3 TIMES A DAY AS NEEDED FOR ANIXETY   anagrelide 1 MG capsule Commonly known as:  AGRYLIN Take 1 capsule (1 mg total) by mouth daily.   aspirin EC 81 MG tablet Take 81 mg by mouth every morning.   budesonide-formoterol 80-4.5 MCG/ACT inhaler Commonly known as:  SYMBICORT Inhale 2 puffs into the lungs 2 (two) times daily.   busPIRone 10 MG tablet Commonly known as:  BUSPAR Take 1 tablet (10 mg total) by mouth 3 (three) times daily.   cetirizine 10 MG tablet Commonly known as:  ZYRTEC Take 1 tablet (10 mg total) by mouth daily.   chlorpheniramine 4 MG tablet Commonly known as:  CHLOR-TRIMETON Take 8 mg by mouth at bedtime.   Cranberry 300 MG tablet Take 300 mg by mouth 2 (two) times daily.   cyclobenzaprine 10 MG tablet Commonly known as:  FLEXERIL Take 1 tablet (10 mg total) by mouth 2 (two) times daily as needed for muscle spasms.   estradiol 0.5 MG tablet Commonly known as:  ESTRACE Take 1 tablet (0.5 mg total) by mouth 2 (two) times daily.   fluticasone 50 MCG/ACT nasal spray Commonly known as:  FLONASE Place 2 sprays into both nostrils daily.   furosemide 20 MG tablet  Commonly known as:  LASIX Take 1 tablet (20 mg total) by mouth 3 (three) times daily as needed for fluid or edema.   levothyroxine 112 MCG tablet Commonly known as:  SYNTHROID, LEVOTHROID Take 1 tablet (112 mcg total) by mouth daily.   magic mouthwash Soln Take 5 mLs by mouth 3 (three) times daily as needed for mouth pain. Components benadryl  525 mg, hydrocortisone 60 mg and nystatin 0.6 mg. 240 ml - Oral   meclizine 12.5 MG tablet Commonly known as:  ANTIVERT Take 1 tablet (12.5 mg total) by mouth 3 (three) times daily as needed for dizziness.   montelukast 10 MG tablet Commonly known as:  SINGULAIR Take 1 tablet (10 mg total) by mouth at bedtime.   MULTIVITAMIN PO Take 1 tablet by mouth every morning.   nystatin 100000 UNIT/ML suspension Commonly known as:  MYCOSTATIN   ondansetron  8 MG tablet Commonly known as:  ZOFRAN TAKE 1/2 TO 1 TABLET BY MOUTH EVERY 8 HOURS AS NEEDED FOR NAUSEA AND VOMITING   oxyCODONE-acetaminophen 7.5-325 MG tablet Commonly known as:  PERCOCET Take 1 tablet by mouth every 4 (four) hours as needed for severe pain.   ranitidine 300 MG tablet Commonly known as:  ZANTAC Take 1 tablet (300 mg total) by mouth at bedtime.   RESTASIS 0.05 % ophthalmic emulsion Generic drug:  cycloSPORINE PLACE ONE DROP INTO EACH EYE EVERY 12 HOURS   ruxolitinib phosphate 20 MG tablet Commonly known as:  JAKAFI Take 1 tablet (20 mg total) by mouth 2 (two) times daily.   silver sulfADIAZINE 1 % cream Commonly known as:  SILVADENE Apply 1 application topically daily.   venlafaxine XR 150 MG 24 hr capsule Commonly known as:  EFFEXOR-XR TAKE 1 CAPSULE (150 MG TOTAL) BY MOUTH DAILY WITH BREAKFAST.       Allergies: No Known Allergies  Past Medical History, Surgical history, Social history, and Family History were reviewed and updated.  Review of Systems: Review of Systems  Constitutional: Positive for diaphoresis and malaise/fatigue.  HENT: Negative.   Eyes: Negative.   Respiratory: Positive for shortness of breath.   Cardiovascular: Negative.   Gastrointestinal: Positive for abdominal pain, heartburn and nausea.  Genitourinary: Negative.   Musculoskeletal: Positive for joint pain and myalgias.  Skin: Negative.   Neurological: Positive for dizziness.  Endo/Heme/Allergies: Negative.   Psychiatric/Behavioral: Negative.      Physical Exam:  weight is 152 lb (68.9 kg). Her oral temperature is 98.7 F (37.1 C). Her blood pressure is 125/73 and her pulse is 91. Her respiration is 18 and oxygen saturation is 100%.   Wt Readings from Last 3 Encounters:  04/26/18 152 lb (68.9 kg)  03/22/18 151 lb (68.5 kg)  02/22/18 152 lb (68.9 kg)    Physical Exam  Constitutional: She is oriented to person, place, and time.  HENT:  Head: Normocephalic and  atraumatic.  Mouth/Throat: Oropharynx is clear and moist.  Eyes: Pupils are equal, round, and reactive to light. EOM are normal.  Neck: Normal range of motion.  Cardiovascular: Normal rate, regular rhythm and normal heart sounds.  Pulmonary/Chest: Effort normal and breath sounds normal.  Abdominal: Soft. Bowel sounds are normal.  Her spleen tip is palpable a couple centimeters below the left costal margin.  Musculoskeletal: Normal range of motion. She exhibits no edema, tenderness or deformity.  Lymphadenopathy:    She has no cervical adenopathy.  Neurological: She is alert and oriented to person, place, and time.  Skin: Skin is warm  and dry. No rash noted. No erythema.  Psychiatric: She has a normal mood and affect. Her behavior is normal. Judgment and thought content normal.  Vitals reviewed.    Lab Results  Component Value Date   WBC 6.1 04/26/2018   HGB 10.3 (L) 04/26/2018   HCT 32.4 (L) 04/26/2018   MCV 109.5 (H) 04/26/2018   PLT 599 (H) 04/26/2018   Lab Results  Component Value Date   FERRITIN 1,155 (H) 03/22/2018   IRON 144 (H) 03/22/2018   TIBC 254 03/22/2018   UIBC 109 03/22/2018   IRONPCTSAT 57 03/22/2018   Lab Results  Component Value Date   RETICCTPCT 2.2 (H) 03/22/2018   RBC 2.96 (L) 04/26/2018   RETICCTABS 45.8 06/02/2015   No results found for: KPAFRELGTCHN, LAMBDASER, KAPLAMBRATIO No results found for: IGGSERUM, IGA, IGMSERUM No results found for: Kathrynn Ducking, MSPIKE, SPEI   Chemistry      Component Value Date/Time   NA 139 04/26/2018 1208   NA 143 07/16/2017 1057   NA 140 11/29/2016 0936   K 3.3 04/26/2018 1208   K 3.2 (L) 07/16/2017 1057   K 4.2 11/29/2016 0936   CL 108 04/26/2018 1208   CL 104 07/16/2017 1057   CO2 31 04/26/2018 1208   CO2 25 07/16/2017 1057   CO2 27 11/29/2016 0936   BUN 12 04/26/2018 1208   BUN 10 07/16/2017 1057   BUN 18.3 11/29/2016 0936   CREATININE 1.10 04/26/2018 1208     CREATININE 0.8 07/16/2017 1057   CREATININE 0.8 11/29/2016 0936      Component Value Date/Time   CALCIUM 8.9 04/26/2018 1208   CALCIUM 8.9 07/16/2017 1057   CALCIUM 8.9 11/29/2016 0936   ALKPHOS 85 (H) 04/26/2018 1208   ALKPHOS 71 07/16/2017 1057   ALKPHOS 77 11/29/2016 0936   AST 37 04/26/2018 1208   AST 19 11/29/2016 0936   ALT 29 04/26/2018 1208   ALT 27 07/16/2017 1057   ALT 10 11/29/2016 0936   BILITOT 0.4 04/26/2018 1208   BILITOT <0.22 11/29/2016 0936      Impression and Plan: Ms. Sowder is a very pleasant 67 yo caucasian female with essential thrombocythemia.   Again, we will increase her anagrelide dose to 1 mg p.o. twice daily.  Hopefully this, in combination with the [facility], will get her platelet count down.  I will plan to get her back in another 4 weeks.  I think this would be reasonable.  I know that there is a new medication that we have for patients who have post -ET myelofibrosis.  She might qualify for this.  The name of the medication is fedratinib.  Unfortunately, it cost $22,000 a month.  [name], MD 9/27/20191:03 PM

## 2018-04-29 LAB — IRON AND TIBC
Iron: 63 ug/dL (ref 41–142)
Saturation Ratios: 27 % (ref 21–57)
TIBC: 238 ug/dL (ref 236–444)
UIBC: 174 ug/dL

## 2018-04-29 LAB — FERRITIN: Ferritin: 1180 ng/mL — ABNORMAL HIGH (ref 11–307)

## 2018-04-29 MED ORDER — CYCLOBENZAPRINE HCL 10 MG PO TABS: 10.0000 mg | ORAL_TABLET | Freq: Two times a day (BID) | ORAL | 0 refills | Status: DC | PRN

## 2018-04-29 MED ORDER — BUSPIRONE HCL 10 MG PO TABS: 10.0000 mg | ORAL_TABLET | Freq: Three times a day (TID) | ORAL | 0 refills | Status: DC

## 2018-04-30 ENCOUNTER — Other Ambulatory Visit: Payer: Self-pay | Admitting: Family Medicine

## 2018-04-30 ENCOUNTER — Other Ambulatory Visit: Payer: Self-pay | Admitting: Hematology & Oncology

## 2018-04-30 DIAGNOSIS — D473 Essential (hemorrhagic) thrombocythemia: Secondary | ICD-10-CM

## 2018-04-30 MED ORDER — LEVOTHYROXINE SODIUM 112 MCG PO TABS: 112.0000 ug | ORAL_TABLET | Freq: Every day | ORAL | 6 refills | Status: DC

## 2018-04-30 MED FILL — ANAGRELIDE HCL 1 MG CAPSULE: 1 | 30 days supply | Qty: 60 | Fill #0

## 2018-04-30 NOTE — Telephone Encounter (Signed)
Copied from [facility] [id number]. Topic: Quick Communication - Rx Refill/Question >> Apr 30, 2018  8:58 AM Bea Graff, NT wrote: Medication: levothyroxine (SYNTHROID, LEVOTHROID) 112 MCG tablet    Has the patient contacted their pharmacy? Yes.   (Agent: If no, request that the patient contact the pharmacy for the refill.) (Agent: If yes, when and what did the pharmacy advise?)  Preferred Pharmacy (with phone number or street name): [place], [place] [phone] (Phone) [phone] (Fax)    Agent: Please be advised that RX refills may take up to 3 business days. We ask that you follow-up with your pharmacy.

## 2018-04-30 NOTE — Telephone Encounter (Signed)
Requested Prescriptions  Pending Prescriptions Disp Refills  . levothyroxine (SYNTHROID, LEVOTHROID) 112 MCG tablet 30 tablet 6    Sig: Take 1 tablet (112 mcg total) by mouth daily.     Endocrinology:  Hypothyroid Agents Failed - 04/30/2018 10:49 AM      Failed - TSH needs to be rechecked within 3 months after an abnormal result. Refill until TSH is due.      Failed - TSH in normal range and within 360 days    TSH  Date Value Ref Range Status  12/27/2017 0.112 (L) 0.308 - 3.960 uIU/mL Final    Comment:    Performed at [facility], [address], [place], [place] [address]  11/05/2017 1.60 0.35 - 4.50 uIU/mL Final         Passed - Valid encounter within last 12 months    Recent Outpatient Visits          1 month ago Hypomagnesemia   [facility], MD   4 months ago High risk medication use   [facility], MD   5 months ago Breast cancer screening   [facility], MD   7 months ago High risk medication use   [facility] [place] [facility], MD   8 months ago Thyroid disease   [facility] [place], MD      Future Appointments            In 6 days Mosie Lukes, MD [facility]

## 2018-05-06 ENCOUNTER — Encounter: Payer: Self-pay | Admitting: Family Medicine

## 2018-05-06 ENCOUNTER — Ambulatory Visit (INDEPENDENT_AMBULATORY_CARE_PROVIDER_SITE_OTHER): Payer: Medicare Other | Admitting: Family Medicine

## 2018-05-06 VITALS — BP 100/62 | HR 88 | Temp 97.5°F | Resp 18 | Ht 63.0 in | Wt 157.4 lb

## 2018-05-06 DIAGNOSIS — E785 Hyperlipidemia, unspecified: Secondary | ICD-10-CM

## 2018-05-06 DIAGNOSIS — M25511 Pain in right shoulder: Secondary | ICD-10-CM

## 2018-05-06 DIAGNOSIS — E039 Hypothyroidism, unspecified: Secondary | ICD-10-CM | POA: Diagnosis not present

## 2018-05-06 DIAGNOSIS — E559 Vitamin D deficiency, unspecified: Secondary | ICD-10-CM

## 2018-05-06 DIAGNOSIS — I10 Essential (primary) hypertension: Secondary | ICD-10-CM | POA: Diagnosis not present

## 2018-05-06 DIAGNOSIS — Z79899 Other long term (current) drug therapy: Secondary | ICD-10-CM

## 2018-05-06 MED ORDER — GABAPENTIN 100 MG PO CAPS: 100.0000 mg | ORAL_CAPSULE | Freq: Every day | ORAL | 3 refills | Status: DC

## 2018-05-06 MED ORDER — OXYCODONE-ACETAMINOPHEN 10-325 MG PO TABS: 1.0000 | ORAL_TABLET | Freq: Three times a day (TID) | ORAL | 0 refills | Status: DC | PRN

## 2018-05-06 NOTE — Assessment & Plan Note (Signed)
Describes sharp, electrical pain over wound especially when she lies on it. Gabapentin will be added 100 to 300 mg qhs. As needed and as tolerated

## 2018-05-06 NOTE — Assessment & Plan Note (Signed)
Well controlled, no changes to meds. Encouraged heart healthy diet such as the DASH diet and exercise as tolerated.  °

## 2018-05-06 NOTE — Assessment & Plan Note (Signed)
On Levothyroxine, continue to monitor 

## 2018-05-06 NOTE — Patient Instructions (Signed)

## 2018-05-06 NOTE — Assessment & Plan Note (Signed)
Supplement and monitor 

## 2018-05-06 NOTE — Assessment & Plan Note (Signed)
Encouraged heart healthy diet, increase exercise, avoid trans fats, consider a krill oil cap daily 

## 2018-05-07 LAB — CBC
HCT: 31.8 % — ABNORMAL LOW (ref 36.0–46.0)
Hemoglobin: 10.5 g/dL — ABNORMAL LOW (ref 12.0–15.0)
MCHC: 33.1 g/dL (ref 30.0–36.0)
MCV: 107 fl — ABNORMAL HIGH (ref 78.0–100.0)
Platelets: 609 10*3/uL — ABNORMAL HIGH (ref 150.0–400.0)
RBC: 2.97 Mil/uL — ABNORMAL LOW (ref 3.87–5.11)
RDW: 15.5 % (ref 11.5–15.5)
WBC: 4.9 10*3/uL (ref 4.0–10.5)

## 2018-05-07 LAB — COMPREHENSIVE METABOLIC PANEL
ALT: 19 U/L (ref 0–35)
AST: 29 U/L (ref 0–37)
Albumin: 4.3 g/dL (ref 3.5–5.2)
Alkaline Phosphatase: 88 U/L (ref 39–117)
BUN: 16 mg/dL (ref 6–23)
CO2: 32 mEq/L (ref 19–32)
Calcium: 9.2 mg/dL (ref 8.4–10.5)
Chloride: 100 mEq/L (ref 96–112)
Creatinine, Ser: 0.95 mg/dL (ref 0.40–1.20)
GFR: 62.5 mL/min (ref >60.00)
Glucose, Bld: 101 mg/dL — ABNORMAL HIGH (ref 70–99)
Potassium: 5.2 mEq/L — ABNORMAL HIGH (ref 3.5–5.1)
Sodium: 138 mEq/L (ref 135–145)
Total Bilirubin: 0.3 mg/dL (ref 0.2–1.2)
Total Protein: 7.1 g/dL (ref 6.0–8.3)

## 2018-05-07 LAB — VITAMIN D 25 HYDROXY (VIT D DEFICIENCY, FRACTURES): VITD: 37.72 ng/mL (ref 30.00–100.00)

## 2018-05-07 LAB — LIPID PANEL
Cholesterol: 236 mg/dL — ABNORMAL HIGH (ref 0–200)
HDL: 59 mg/dL (ref >39.00)
NonHDL: 177.33
Total CHOL/HDL Ratio: 4
Triglycerides: 322 mg/dL — ABNORMAL HIGH (ref 0.0–149.0)
VLDL: 64.4 mg/dL — ABNORMAL HIGH (ref 0.0–40.0)

## 2018-05-07 LAB — LDL CHOLESTEROL, DIRECT: Direct LDL: 95 mg/dL

## 2018-05-07 LAB — TSH: TSH: 0.02 u[IU]/mL — ABNORMAL LOW (ref 0.35–4.50)

## 2018-05-09 ENCOUNTER — Telehealth: Payer: Self-pay | Admitting: Family Medicine

## 2018-05-09 NOTE — Telephone Encounter (Signed)
Copied from [facility] [id number]. Topic: Quick Communication - See Telephone Encounter >> May 09, 2018 10:06 AM Conception Chancy, NT wrote: CRM for notification. See Telephone encounter for: 05/09/18.  Patient is calling and states [facility] is calling and requesting a call stating it is okay to fill oxyCODONE-acetaminophen (PERCOCET) 10-325 MG tablet and needs to know to stop the 7.5mg .  Patient would like a call back once this has been completed so she can get her medicine.   [place] [address], [place] - [address] [place] [address] Phone: [phone] Fax: [phone]

## 2018-05-09 NOTE — Telephone Encounter (Signed)
Pharmacy calling to get clarification on Dr. Randel Pigg sending a script for 7.5-325 percocet on 04/25/2018 one month supply, and sending another script for 10-325 percocet on 05/06/2018? Please contact pharmacy to assist.

## 2018-05-09 NOTE — Telephone Encounter (Signed)
Called pharmacy no one answered. Unable to leave voice mail. Patient dosage was increased

## 2018-05-10 ENCOUNTER — Telehealth: Payer: Self-pay

## 2018-05-10 LAB — PAIN MGMT, PROFILE 8 W/CONF, U
6 Acetylmorphine: NEGATIVE ng/mL (ref <10)
Alcohol Metabolites: NEGATIVE ng/mL (ref <500)
Alphahydroxyalprazolam: 1104 ng/mL — ABNORMAL HIGH (ref <25)
Alphahydroxymidazolam: NEGATIVE ng/mL (ref <50)
Alphahydroxytriazolam: NEGATIVE ng/mL (ref <50)
Aminoclonazepam: NEGATIVE ng/mL (ref <25)
Amphetamines: NEGATIVE ng/mL (ref <500)
Benzodiazepines: POSITIVE ng/mL — AB (ref <100)
Buprenorphine, Urine: NEGATIVE ng/mL (ref <5)
Cocaine Metabolite: NEGATIVE ng/mL (ref <150)
Codeine: NEGATIVE ng/mL (ref <50)
Creatinine: 71 mg/dL
Hydrocodone: NEGATIVE ng/mL (ref <50)
Hydromorphone: 74 ng/mL — ABNORMAL HIGH (ref <50)
Hydroxyethylflurazepam: NEGATIVE ng/mL (ref <50)
Lorazepam: NEGATIVE ng/mL (ref <50)
MDMA: NEGATIVE ng/mL (ref <500)
Marijuana Metabolite: NEGATIVE ng/mL (ref <20)
Morphine: NEGATIVE ng/mL (ref <50)
Nordiazepam: NEGATIVE ng/mL (ref <50)
Norhydrocodone: 107 ng/mL — ABNORMAL HIGH (ref <50)
Noroxycodone: 2502 ng/mL — ABNORMAL HIGH (ref <50)
Opiates: POSITIVE ng/mL — AB (ref <100)
Oxazepam: NEGATIVE ng/mL (ref <50)
Oxidant: NEGATIVE ug/mL (ref <200)
Oxycodone: 2021 ng/mL — ABNORMAL HIGH (ref <50)
Oxycodone: POSITIVE ng/mL — AB (ref <100)
Oxymorphone: 3421 ng/mL — ABNORMAL HIGH (ref <50)
Temazepam: NEGATIVE ng/mL (ref <50)
pH: 5.96 (ref 4.5–9.0)

## 2018-05-10 NOTE — Telephone Encounter (Signed)
Author phoned pt. to relay that Pryor Curia spoke to pharmacy and clarified dosage of percocet with Jenny Reichmann. Author ensured pt. knew that the dosage had been increased with frequency lengthened (10mg -325mg  every 8 hr prn) and pt. Verified that, and denied taking the older 7.5mg  dosage. No other concerns at this time.

## 2018-05-10 NOTE — Telephone Encounter (Signed)
Rachael Johnson with [facility] would like a call back from a clinical staff member. He said that she just filled 10-325 percocet on 10/7 for #120 tablets, as well as filling the 7.5-325 percocet on 10/4 for #120 quantity. The one on 10/4 was to take every 4 hours and the one on 10/7 was to take every 8 hours. Please call back @ [phone]

## 2018-05-10 NOTE — Telephone Encounter (Signed)
See telephone note.

## 2018-05-12 NOTE — Progress Notes (Signed)
Subjective:    Patient ID: Rachael Johnson, female    DOB: Apr 08, 1951, 67 y.o.   MRN: [id number]  No chief complaint on file.   HPI Patient is in today for follow up. No recent febrile illness or hospitalizations. She continues to struggle with right shoulder pain as well as diffuse joint pain. She has been noting some swelling and pain in her knees recently. No fall or injury. Denies CP/palp/SOB/HA/congestion/fevers/GI or GU c/o. Taking meds as prescribed  Past Medical History:  Diagnosis Date  . Acute renal insufficiency 04/09/2017  . Anemia, iron deficiency 07/08/2012  . Arthritis   . Cataract 12/21/2014  . Depression with anxiety 03/24/2011  . Diverticulitis   . Esophageal reflux 08/17/2013  . Essential thrombocythemia ([place]) 01/24/2011  . Frequent episodic tension-type headache   . Gastroenteritis 12/21/2014  . 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 GERD (gastroesophageal reflux disease)   . H. pylori infection 12/19/2012  . Hyperglycemia 12/17/2015  . Hypertension   . Iron deficiency anemia due to chronic blood loss 04/03/2017  . Osteoarthritis 05/24/2017  . Other and unspecified hyperlipidemia 02/25/2013  . Restless leg syndrome 09/30/2014  . Rhabdomyolysis 02/2017  . Scabies 03/28/2015  . 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 Seizure ([place])    childhood  . Shoulder wound, right, sequela 03/14/2017  . Thyroid disease     Past Surgical History:  Procedure Laterality Date  . ABDOMINAL HYSTERECTOMY     1992  . BREAST CYST ASPIRATION    . BREAST SURGERY  1992   biopsy, benign. Fibrocystic  . UMBILICAL HERNIA REPAIR N/A 09/25/2012   Procedure: HERNIA REPAIR UMBILICAL ADULT;  Surgeon: Harl Bowie, MD;  Location: [facility];  Service: General;  Laterality: N/A;    Family History  Problem Relation Age of Onset  . Arthritis Mother   . Cancer Mother        ovarian  . Hypertension Mother   . Heart disease Mother        pacer  . Heart failure Mother   . Arthritis Father   . Cancer  Father        lung  . Hypertension Father   . Cancer Sister        lung  . Cancer Brother        prostate  . Cancer Brother        lung  . Hypertension Son   . COPD Brother   . Heart disease Brother        died from CHF  . Hypertension Brother   . Cancer Brother        colon  . Alcohol abuse Brother   . Cirrhosis Brother   . Seizures Sister   . Stroke Sister   . Arthritis Brother     Social History   Socioeconomic History  . Marital status: Widowed    Spouse name: Not on file  . Number of children: 2  . Years of education: Not on file  . Highest education level: Not on file  Occupational History  . Occupation: APL IT sales professional: [employer]    Comment: [place]  . Financial resource strain: Not on file  . Food insecurity:    Worry: Not on file    Inability: Not on file  . Transportation needs:    Medical: Not on file    Non-medical: Not on file  Tobacco Use  . Smoking status: Former Smoker    Packs/day: 3.50  Years: 20.00    Pack years: 70.00    Types: Cigarettes    Start date: 11/15/1970    Last attempt to quit: 07/31/1990    Years since quitting: 27.8  . Smokeless tobacco: Never Used  . Tobacco comment: quit 23 years ago  Substance and Sexual Activity  . Alcohol use: No    Alcohol/week: 0.0 standard drinks  . Drug use: No  . Sexual activity: Never  Lifestyle  . Physical activity:    Days per week: Not on file    Minutes per session: Not on file  . Stress: Not on file  Relationships  . Social connections:    Talks on phone: Not on file    Gets together: Not on file    Attends religious service: Not on file    Active member of club or organization: Not on file    Attends meetings of clubs or organizations: Not on file    Relationship status: Not on file  . Intimate partner violence:    Fear of current or ex partner: Not on file    Emotionally abused: Not on file    Physically abused: Not on file    Forced sexual activity:  Not on file  Other Topics Concern  . Not on file  Social History Narrative   Regular exercise: no   Caffeine Use: 2-3 weekly    Outpatient Medications Prior to Visit  Medication Sig Dispense Refill  . albuterol (VENTOLIN HFA) 108 (90 Base) MCG/ACT inhaler Inhale 2 puffs into the lungs every 6 (six) hours as needed for wheezing or shortness of breath. 54 Inhaler 1  . ALPRAZolam (XANAX) 1 MG tablet Take 0.5-1 tablets (0.5-1 mg total) by mouth 3 (three) times daily as needed for anxiety. TAKE 1 TABLET 3 TIMES A DAY AS NEEDED FOR ANIXETY 90 tablet 1  . anagrelide (AGRYLIN) 1 MG capsule Take 1 capsule (1 mg total) by mouth 2 (two) times daily. 60 capsule 3  . aspirin EC 81 MG tablet Take 81 mg by mouth every morning.    . budesonide-formoterol (SYMBICORT) 80-4.5 MCG/ACT inhaler Inhale 2 puffs into the lungs 2 (two) times daily. 10.2 g 5  . busPIRone (BUSPAR) 10 MG tablet Take 1 tablet (10 mg total) by mouth 3 (three) times daily. 90 tablet 0  . cetirizine (ZYRTEC) 10 MG tablet Take 1 tablet (10 mg total) by mouth daily. 90 tablet 1  . chlorpheniramine (CHLOR-TRIMETON) 4 MG tablet Take 8 mg by mouth at bedtime.    . Cranberry 300 MG tablet Take 300 mg by mouth 2 (two) times daily.    . cyclobenzaprine (FLEXERIL) 10 MG tablet Take 1 tablet (10 mg total) by mouth 2 (two) times daily as needed for muscle spasms. 60 tablet 0  . estradiol (ESTRACE) 0.5 MG tablet Take 1 tablet (0.5 mg total) by mouth 2 (two) times daily. 180 tablet 1  . fluticasone (FLONASE) 50 MCG/ACT nasal spray Place 2 sprays into both nostrils daily. 48 g 3  . furosemide (LASIX) 20 MG tablet Take 1 tablet (20 mg total) by mouth 3 (three) times daily as needed for fluid or edema. 90 tablet 2  . levothyroxine (SYNTHROID, LEVOTHROID) 112 MCG tablet Take 1 tablet (112 mcg total) by mouth daily. 30 tablet 6  . magic mouthwash SOLN Take 5 mLs by mouth 3 (three) times daily as needed for mouth pain. Components benadryl  525 mg,  hydrocortisone 60 mg and nystatin 0.6 mg. 240 ml - Oral  240 mL 3  . meclizine (ANTIVERT) 12.5 MG tablet Take 1 tablet (12.5 mg total) by mouth 3 (three) times daily as needed for dizziness. 40 tablet 2  . montelukast (SINGULAIR) 10 MG tablet Take 1 tablet (10 mg total) by mouth at bedtime. 90 tablet 2  . Multiple Vitamin (MULTIVITAMIN PO) Take 1 tablet by mouth every morning.     . nystatin (MYCOSTATIN) 100000 UNIT/ML suspension     . ondansetron (ZOFRAN) 8 MG tablet TAKE 1/2 TO 1 TABLET BY MOUTH EVERY 8 HOURS AS NEEDED FOR NAUSEA AND VOMITING 90 tablet 1  . ranitidine (ZANTAC) 300 MG tablet Take 1 tablet (300 mg total) by mouth at bedtime. 90 tablet 1  . RESTASIS 0.05 % ophthalmic emulsion PLACE ONE DROP INTO EACH EYE EVERY 12 HOURS  3  . ruxolitinib phosphate (JAKAFI) 20 MG tablet Take 1 tablet (20 mg total) by mouth 2 (two) times daily. 60 tablet 4  . silver sulfADIAZINE (SILVADENE) 1 % cream Apply 1 application topically daily. 50 g 1  . venlafaxine XR (EFFEXOR-XR) 150 MG 24 hr capsule TAKE 1 CAPSULE (150 MG TOTAL) BY MOUTH DAILY WITH BREAKFAST. 90 capsule 1  . oxyCODONE-acetaminophen (PERCOCET) 7.5-325 MG tablet Take 1 tablet by mouth every 4 (four) hours as needed for severe pain. 120 tablet 0   No facility-administered medications prior to visit.     No Known Allergies  Review of Systems  Constitutional: Positive for malaise/fatigue. Negative for fever.  HENT: Negative for congestion.   Eyes: Negative for blurred vision.  Respiratory: Negative for shortness of breath.   Cardiovascular: Negative for chest pain, palpitations and leg swelling.  Gastrointestinal: Negative for abdominal pain, blood in stool and nausea.  Genitourinary: Negative for dysuria and frequency.  Musculoskeletal: Positive for joint pain. Negative for falls.  Skin: Negative for rash.  Neurological: Negative for dizziness, loss of consciousness and headaches.  Endo/Heme/Allergies: Negative for environmental  allergies.  Psychiatric/Behavioral: Negative for depression. The patient is not nervous/anxious.        Objective:    Physical Exam  Constitutional: She is oriented to person, place, and time. She appears well-developed and well-nourished. No distress.  HENT:  Head: Normocephalic and atraumatic.  Nose: Nose normal.  Eyes: Right eye exhibits no discharge. Left eye exhibits no discharge.  Neck: Normal range of motion. Neck supple.  Cardiovascular: Normal rate and regular rhythm.  No murmur heard. Pulmonary/Chest: Effort normal and breath sounds normal.  Abdominal: Soft. Bowel sounds are normal. There is no tenderness.  Musculoskeletal: She exhibits no edema.  Neurological: She is alert and oriented to person, place, and time.  Skin: Skin is warm and dry.  Psychiatric: She has a normal mood and affect.  Nursing note and vitals reviewed.   BP 100/62 (BP Location: Left Arm, Patient Position: Sitting, Cuff Size: Normal)   Pulse 88   Temp (!) 97.5 F (36.4 C) (Oral)   Resp 18   Ht _0  (1.6 m)   Wt 157 lb 6.4 oz (71.4 kg)   LMP 07/31/1990   SpO2 95%   BMI 27.88 kg/m  Wt Readings from Last 3 Encounters:  05/06/18 157 lb 6.4 oz (71.4 kg)  04/26/18 152 lb (68.9 kg)  03/22/18 151 lb (68.5 kg)     Lab Results  Component Value Date   WBC 4.9 05/06/2018   HGB 10.5 (L) 05/06/2018   HCT 31.8 (L) 05/06/2018   PLT 609.0 (H) 05/06/2018   GLUCOSE 101 (H) 05/06/2018  CHOL 236 (H) 05/06/2018   TRIG 322.0 (H) 05/06/2018   HDL 59.00 05/06/2018   LDLDIRECT 95.0 05/06/2018   LDLCALC 82 02/12/2017   ALT 19 05/06/2018   AST 29 05/06/2018   NA 138 05/06/2018   K 5.2 (H) 05/06/2018   CL 100 05/06/2018   CREATININE 0.95 05/06/2018   BUN 16 05/06/2018   CO2 32 05/06/2018   TSH 0.02 (L) 05/06/2018   INR 0.95 11/16/2017   HGBA1C 5.0 08/10/2017    Lab Results  Component Value Date   TSH 0.02 (L) 05/06/2018   Lab Results  Component Value Date   WBC 4.9 05/06/2018   HGB 10.5  (L) 05/06/2018   HCT 31.8 (L) 05/06/2018   MCV 107.0 (H) 05/06/2018   PLT 609.0 (H) 05/06/2018   Lab Results  Component Value Date   NA 138 05/06/2018   K 5.2 (H) 05/06/2018   CHLORIDE 105 11/29/2016   CO2 32 05/06/2018   GLUCOSE 101 (H) 05/06/2018   BUN 16 05/06/2018   CREATININE 0.95 05/06/2018   BILITOT 0.3 05/06/2018   ALKPHOS 88 05/06/2018   AST 29 05/06/2018   ALT 19 05/06/2018   PROT 7.1 05/06/2018   ALBUMIN 4.3 05/06/2018   CALCIUM 9.2 05/06/2018   ANIONGAP 0 (L) 04/26/2018   EGFR 80 (L) 11/29/2016   GFR 62.50 05/06/2018   Lab Results  Component Value Date   CHOL 236 (H) 05/06/2018   Lab Results  Component Value Date   HDL 59.00 05/06/2018   Lab Results  Component Value Date   LDLCALC 82 02/12/2017   Lab Results  Component Value Date   TRIG 322.0 (H) 05/06/2018   Lab Results  Component Value Date   CHOLHDL 4 05/06/2018   Lab Results  Component Value Date   HGBA1C 5.0 08/10/2017       Assessment & Plan:   Problem List Items Addressed This Visit    Hypothyroid    On Levothyroxine, continue to monitor      Relevant Orders   TSH (Completed)   Hyperlipidemia    Encouraged heart healthy diet, increase exercise, avoid trans fats, consider a krill oil cap daily      Relevant Orders   Lipid panel (Completed)   Vitamin D deficiency    Supplement and monitor       Relevant Orders   VITAMIN D 25 Hydroxy (Vit-D Deficiency, Fractures) (Completed)   Essential hypertension    Well controlled, no changes to meds. Encouraged heart healthy diet such as the DASH diet and exercise as tolerated.       Relevant Orders   CBC (Completed)   Comprehensive metabolic panel (Completed)   Right shoulder pain    Describes sharp, electrical pain over wound especially when she lies on it. Gabapentin will be added 100 to 300 mg qhs. As needed and as tolerated       Other Visit Diagnoses    High risk medication use    -  Primary   Relevant Orders   Pain  Mgmt, Profile 8 w/Conf, U (Completed)      I have discontinued Jacky D. Rabine's oxyCODONE-acetaminophen. I am also having her start on gabapentin and oxyCODONE-acetaminophen. Additionally, I am having her maintain her Multiple Vitamin (MULTIVITAMIN PO), aspirin EC, Cranberry, fluticasone, ranitidine, chlorpheniramine, RESTASIS, venlafaxine XR, cetirizine, montelukast, budesonide-formoterol, albuterol, magic mouthwash, nystatin, ALPRAZolam, meclizine, silver sulfADIAZINE, ruxolitinib phosphate, ondansetron, estradiol, furosemide, busPIRone, cyclobenzaprine, anagrelide, and levothyroxine.  Meds ordered this encounter  Medications  .  gabapentin (NEURONTIN) 100 MG capsule    Sig: Take 1-3 capsules (100-300 mg total) by mouth at bedtime.    Dispense:  90 capsule    Refill:  3  . oxyCODONE-acetaminophen (PERCOCET) 10-325 MG tablet    Sig: Take 1 tablet by mouth every 8 (eight) hours as needed for pain.    Dispense:  120 tablet    Refill:  0     Penni Homans, MD

## 2018-05-13 ENCOUNTER — Encounter: Payer: Self-pay | Admitting: Family Medicine

## 2018-05-14 MED ORDER — GABAPENTIN 100 MG PO CAPS: 400.0000 mg | ORAL_CAPSULE | Freq: Every day | ORAL | 3 refills | Status: DC

## 2018-05-14 MED ORDER — ATORVASTATIN CALCIUM 10 MG PO TABS: 10.0000 mg | ORAL_TABLET | Freq: Every day | ORAL | 3 refills | Status: DC

## 2018-05-14 NOTE — Addendum Note (Signed)
Addended by: Magdalene Molly A on: 05/14/2018 06:20 PM   Modules accepted: Orders

## 2018-05-14 NOTE — Telephone Encounter (Signed)
Pt. With low grade fever, clogged ears, runny nose. Pt. Requests gabapentin refill as well, as taking up to "4 at a time". Last ordered 10/7 #90, 3 RF. Routed to Dr. Charlett Blake to advise.

## 2018-05-15 ENCOUNTER — Telehealth: Payer: Self-pay

## 2018-05-15 NOTE — Telephone Encounter (Signed)
Pt's plan only covers maximum of 3 capsules per day. However, will request QL override. PA initiated via Covermymeds; KEY: [id number]. Awaiting determination.

## 2018-05-15 NOTE — Telephone Encounter (Signed)
PA approved.   PA Case: [id number], Status: Approved, Coverage Starts on: 07/31/2017, Coverage Ends on: 07/30/2018. Questions? Contact [phone].

## 2018-05-16 ENCOUNTER — Encounter: Payer: Self-pay | Admitting: Family Medicine

## 2018-05-16 DIAGNOSIS — E039 Hypothyroidism, unspecified: Secondary | ICD-10-CM

## 2018-05-16 MED FILL — JAKAFI 20 MG TABLET: 20 | 30 days supply | Qty: 60 | Fill #1

## 2018-05-17 MED ORDER — LEVOTHYROXINE SODIUM 100 MCG PO TABS: 100.0000 ug | ORAL_TABLET | Freq: Every day | ORAL | 3 refills | Status: DC

## 2018-05-24 ENCOUNTER — Other Ambulatory Visit: Payer: Self-pay | Admitting: Family Medicine

## 2018-05-27 NOTE — Telephone Encounter (Signed)
Requesting:Xanax Contract:yes UDS: 05/06/18 low risk next screen 11/05/18 Last OV:05/06/18 Next OV:06/17/18 Last Refill:03/04/18 #90-1rf Database:   Please advise

## 2018-05-30 ENCOUNTER — Inpatient Hospital Stay: Payer: Medicare Other | Attending: Hematology & Oncology

## 2018-05-30 ENCOUNTER — Encounter: Payer: Self-pay | Admitting: Hematology & Oncology

## 2018-05-30 ENCOUNTER — Inpatient Hospital Stay (HOSPITAL_BASED_OUTPATIENT_CLINIC_OR_DEPARTMENT_OTHER): Payer: Medicare Other | Admitting: Hematology & Oncology

## 2018-05-30 ENCOUNTER — Other Ambulatory Visit: Payer: Self-pay

## 2018-05-30 ENCOUNTER — Other Ambulatory Visit: Payer: Self-pay | Admitting: Family Medicine

## 2018-05-30 VITALS — BP 111/64 | HR 83 | Temp 98.4°F | Resp 18 | Wt 155.0 lb

## 2018-05-30 DIAGNOSIS — Z7982 Long term (current) use of aspirin: Secondary | ICD-10-CM | POA: Insufficient documentation

## 2018-05-30 DIAGNOSIS — Z79899 Other long term (current) drug therapy: Secondary | ICD-10-CM

## 2018-05-30 DIAGNOSIS — D500 Iron deficiency anemia secondary to blood loss (chronic): Secondary | ICD-10-CM

## 2018-05-30 DIAGNOSIS — D7581 Myelofibrosis: Secondary | ICD-10-CM

## 2018-05-30 DIAGNOSIS — G8929 Other chronic pain: Secondary | ICD-10-CM

## 2018-05-30 DIAGNOSIS — D473 Essential (hemorrhagic) thrombocythemia: Secondary | ICD-10-CM | POA: Insufficient documentation

## 2018-05-30 DIAGNOSIS — M549 Dorsalgia, unspecified: Principal | ICD-10-CM

## 2018-05-30 LAB — CBC WITH DIFFERENTIAL (CANCER CENTER ONLY)
Abs Immature Granulocytes: 0.02 10*3/uL (ref 0.00–0.07)
Basophils Absolute: 0 10*3/uL (ref 0.0–0.1)
Basophils Relative: 1 %
Eosinophils Absolute: 0.1 10*3/uL (ref 0.0–0.5)
Eosinophils Relative: 3 %
HCT: 32.8 % — ABNORMAL LOW (ref 36.0–46.0)
Hemoglobin: 10 g/dL — ABNORMAL LOW (ref 12.0–15.0)
Immature Granulocytes: 1 %
Lymphocytes Relative: 30 %
Lymphs Abs: 1.3 10*3/uL (ref 0.7–4.0)
MCH: 33.6 pg (ref 26.0–34.0)
MCHC: 30.5 g/dL (ref 30.0–36.0)
MCV: 110.1 fL — ABNORMAL HIGH (ref 80.0–100.0)
Monocytes Absolute: 0.4 10*3/uL (ref 0.1–1.0)
Monocytes Relative: 9 %
Neutro Abs: 2.5 10*3/uL (ref 1.7–7.7)
Neutrophils Relative %: 56 %
Platelet Count: 667 10*3/uL — ABNORMAL HIGH (ref 150–400)
RBC: 2.98 MIL/uL — ABNORMAL LOW (ref 3.87–5.11)
RDW: 13.8 % (ref 11.5–15.5)
WBC Count: 4.4 10*3/uL (ref 4.0–10.5)
nRBC: 0 % (ref 0.0–0.2)

## 2018-05-30 LAB — CMP (CANCER CENTER ONLY)
ALT: 28 U/L (ref 10–47)
AST: 58 U/L — ABNORMAL HIGH (ref 11–38)
Albumin: 3.6 g/dL (ref 3.5–5.0)
Alkaline Phosphatase: 71 U/L (ref 26–84)
Anion gap: 4 — ABNORMAL LOW (ref 5–15)
BUN: 11 mg/dL (ref 7–22)
CO2: 32 mmol/L (ref 18–33)
Calcium: 8.4 mg/dL (ref 8.0–10.3)
Chloride: 102 mmol/L (ref 98–108)
Creatinine: 1.2 mg/dL (ref 0.60–1.20)
Glucose, Bld: 138 mg/dL — ABNORMAL HIGH (ref 73–118)
Potassium: 3.7 mmol/L (ref 3.3–4.7)
Sodium: 138 mmol/L (ref 128–145)
Total Bilirubin: 0.4 mg/dL (ref 0.2–1.6)
Total Protein: 6.8 g/dL (ref 6.4–8.1)

## 2018-05-30 LAB — SAVE SMEAR(SSMR), FOR PROVIDER SLIDE REVIEW

## 2018-05-30 LAB — LACTATE DEHYDROGENASE: LDH: 300 U/L — ABNORMAL HIGH (ref 98–192)

## 2018-05-30 NOTE — Progress Notes (Signed)
Hematology and Oncology Follow Up Visit  Rachael Johnson [id number] [birth year] 67 y.o. 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  Rachael Johnson is not feeling all that well.  Rachael Johnson said that Rachael Johnson has not felt well for about 3 weeks.  Rachael Johnson is had a bit of a temperature.  Rachael Johnson said that at one point temperature was 102 degrees.  Rachael Johnson has not been on any antibiotics.  Rachael Johnson has not seen Rachael Johnson doctor about this.  Rachael Johnson still not sure about moving in with Rachael Johnson son and daughter-in-law.  Rachael Johnson has had no diarrhea.  Rachael Johnson is had no cough.  Rachael Johnson is had no rashes.  There is been no leg swelling.  Rachael Johnson last iron studies that we did on Rachael Johnson back in September showed a ferritin of 1180 with iron saturation of 27%.  Rachael Johnson is had no bleeding.  There is been no mouth sores.  Rachael Johnson right shoulder hurts chronically because of the wound Rachael Johnson incurred on the top of the right shoulder.    Overall, Rachael Johnson performance status is ECOG 1.     Medications:  Allergies as of 05/30/2018   No Known Allergies     Medication List        Accurate as of 05/30/18 12:32 PM. Always use your most recent med list.          albuterol 108 (90 Base) MCG/ACT inhaler Commonly known as:  PROVENTIL HFA;VENTOLIN HFA Inhale 2 puffs into the lungs every 6 (six) hours as needed for wheezing or shortness of breath.   ALPRAZolam 1 MG tablet Commonly known as:  XANAX TAKE 1/2 TO 1 (ONE-HALF TO ONE) TABLET BY MOUTH THREE TIMES DAILY AS NEEDED FOR ANXIETY   anagrelide 1 MG capsule Commonly known as:  AGRYLIN Take 1 capsule (1 mg total) by  mouth 2 (two) times daily.   aspirin EC 81 MG tablet Take 81 mg by mouth every morning.   atorvastatin 10 MG tablet Commonly known as:  LIPITOR Take 1 tablet (10 mg total) by mouth daily.   budesonide-formoterol 80-4.5 MCG/ACT inhaler Commonly known as:  SYMBICORT Inhale 2 puffs into the lungs 2 (two) times daily.   busPIRone 10 MG tablet Commonly known as:  BUSPAR Take 1 tablet (10 mg total) by mouth 3 (three) times daily.   busPIRone 10 MG tablet Commonly known as:  BUSPAR TAKE 1 TABLET BY MOUTH THREE TIMES DAILY   cetirizine 10 MG tablet Commonly known as:  ZYRTEC Take 1 tablet (10 mg total) by mouth daily.   chlorpheniramine 4 MG tablet Commonly known as:  CHLOR-TRIMETON Take 8 mg by mouth at bedtime.   Cranberry 300 MG tablet Take 300 mg by mouth 2 (two) times daily.   cyclobenzaprine 10 MG tablet Commonly known as:  FLEXERIL Take 1 tablet (10 mg total) by mouth 2 (two) times daily as needed for muscle spasms.   estradiol 0.5 MG tablet Commonly known as:  ESTRACE Take 1 tablet (0.5 mg total) by mouth 2 (two) times daily.   fluticasone 50 MCG/ACT nasal spray Commonly known as:  FLONASE Place 2 sprays into both nostrils daily.   furosemide 20 MG tablet Commonly known as:  LASIX Take 1 tablet (20 mg total) by mouth 3 (three) times daily as needed for fluid or edema.   gabapentin 100 MG capsule Commonly known as:  NEURONTIN Take 4 capsules (400 mg total) by mouth at bedtime.   levothyroxine 100 MCG tablet Commonly known as:  SYNTHROID, LEVOTHROID Take 1 tablet (100 mcg total) by mouth daily before breakfast.   magic mouthwash Soln Take 5 mLs by mouth 3 (three) times daily as needed for mouth pain. Components benadryl  525 mg, hydrocortisone 60 mg and nystatin 0.6 mg. 240 ml - Oral   meclizine 12.5 MG tablet Commonly known as:  ANTIVERT Take 1 tablet (12.5 mg total) by mouth 3 (three) times daily as needed for dizziness.   montelukast 10 MG  tablet Commonly known as:  SINGULAIR Take 1 tablet (10 mg total) by mouth at bedtime.   MULTIVITAMIN PO Take 1 tablet by mouth every morning.   nystatin 100000 UNIT/ML suspension Commonly known as:  MYCOSTATIN   ondansetron 8 MG tablet Commonly known as:  ZOFRAN TAKE 1/2 TO 1 TABLET BY MOUTH EVERY 8 HOURS AS NEEDED FOR NAUSEA AND VOMITING   oxyCODONE-acetaminophen 10-325 MG tablet Commonly known as:  PERCOCET Take 1 tablet by mouth every 8 (eight) hours as needed for pain.   ranitidine 300 MG tablet Commonly known as:  ZANTAC Take 1 tablet (300 mg total) by mouth at bedtime.   RESTASIS 0.05 % ophthalmic emulsion Generic drug:  cycloSPORINE PLACE ONE DROP INTO EACH EYE EVERY 12 HOURS   ruxolitinib phosphate 20 MG tablet Commonly known as:  JAKAFI Take 1 tablet (20 mg total) by mouth 2 (two) times daily.   silver sulfADIAZINE 1 % cream Commonly known as:  SILVADENE Apply 1 application topically daily.   venlafaxine XR 150 MG 24 hr capsule Commonly known as:  EFFEXOR-XR TAKE 1 CAPSULE (150 MG TOTAL) BY MOUTH DAILY WITH BREAKFAST.       Allergies: No Known Allergies  Past Medical History, Surgical history, Social history, and Family History were reviewed and updated.  Review of Systems: Review of Systems  Constitutional: Positive for diaphoresis and malaise/fatigue.  HENT: Negative.   Eyes: Negative.   Respiratory: Positive for shortness of breath.   Cardiovascular: Negative.   Gastrointestinal: Positive for abdominal pain, heartburn and nausea.  Genitourinary: Negative.   Musculoskeletal: Positive for joint pain and myalgias.  Skin: Negative.   Neurological: Positive for dizziness.  Endo/Heme/Allergies: Negative.   Psychiatric/Behavioral: Negative.      Physical Exam:  weight is 155 lb (70.3 kg). Rachael Johnson oral temperature is 98.4 F (36.9 C). Rachael Johnson blood pressure is 111/64 and Rachael Johnson pulse is 83. Rachael Johnson respiration is 18 and oxygen saturation is 97%.   Wt Readings  from Last 3 Encounters:  05/30/18 155 lb (70.3 kg)  05/06/18 157 lb 6.4 oz (71.4 kg)  04/26/18 152 lb (68.9 kg)    Physical Exam  Constitutional: Rachael Johnson is oriented to person, place, and time.  HENT:  Head: Normocephalic and atraumatic.  Mouth/Throat: Oropharynx is clear and moist.  Eyes: Pupils are equal, round, and reactive to light. EOM are normal.  Neck: Normal range of motion.  Cardiovascular: Normal rate, regular rhythm and normal heart sounds.  Pulmonary/Chest: Effort normal and breath sounds normal.  Abdominal: Soft. Bowel sounds are normal.  Rachael Johnson spleen tip is palpable a couple centimeters below the left costal margin.  Musculoskeletal:  Normal range of motion. Rachael Johnson exhibits no edema, tenderness or deformity.  Lymphadenopathy:    Rachael Johnson has no cervical adenopathy.  Neurological: Rachael Johnson is alert and oriented to person, place, and time.  Skin: Skin is warm and dry. No rash noted. No erythema.  Psychiatric: Rachael Johnson has a normal mood and affect. Rachael Johnson behavior is normal. Judgment and thought content normal.  Vitals reviewed.    Lab Results  Component Value Date   WBC 4.4 05/30/2018   HGB 10.0 (L) 05/30/2018   HCT 32.8 (L) 05/30/2018   MCV 110.1 (H) 05/30/2018   PLT 667 (H) 05/30/2018   Lab Results  Component Value Date   FERRITIN 1,180 (H) 04/26/2018   IRON 63 04/26/2018   TIBC 238 04/26/2018   UIBC 174 04/26/2018   IRONPCTSAT 27 04/26/2018   Lab Results  Component Value Date   RETICCTPCT 2.2 (H) 03/22/2018   RBC 2.98 (L) 05/30/2018   RETICCTABS 45.8 06/02/2015   No results found for: KPAFRELGTCHN, LAMBDASER, KAPLAMBRATIO No results found for: IGGSERUM, IGA, IGMSERUM No results found for: Kathrynn Ducking, MSPIKE, SPEI   Chemistry      Component Value Date/Time   NA 138 05/06/2018 1535   NA 143 07/16/2017 1057   NA 140 11/29/2016 0936   K 5.2 (H) 05/06/2018 1535   K 3.2 (L) 07/16/2017 1057   K 4.2 11/29/2016 0936   CL 100  05/06/2018 1535   CL 104 07/16/2017 1057   CO2 32 05/06/2018 1535   CO2 25 07/16/2017 1057   CO2 27 11/29/2016 0936   BUN 16 05/06/2018 1535   BUN 10 07/16/2017 1057   BUN 18.3 11/29/2016 0936   CREATININE 0.95 05/06/2018 1535   CREATININE 1.10 04/26/2018 1208   CREATININE 0.8 07/16/2017 1057   CREATININE 0.8 11/29/2016 0936      Component Value Date/Time   CALCIUM 9.2 05/06/2018 1535   CALCIUM 8.9 07/16/2017 1057   CALCIUM 8.9 11/29/2016 0936   ALKPHOS 88 05/06/2018 1535   ALKPHOS 71 07/16/2017 1057   ALKPHOS 77 11/29/2016 0936   AST 29 05/06/2018 1535   AST 37 04/26/2018 1208   AST 19 11/29/2016 0936   ALT 19 05/06/2018 1535   ALT 29 04/26/2018 1208   ALT 27 07/16/2017 1057   ALT 10 11/29/2016 0936   BILITOT 0.3 05/06/2018 1535   BILITOT 0.4 04/26/2018 1208   BILITOT <0.22 11/29/2016 0936      Impression and Plan: Rachael Johnson is a very pleasant 67 yo caucasian female with essential thrombocythemia.   I am a little bit disappointed that Rachael Johnson platelet count has not improved.  We increase Rachael Johnson dose of anagrelide.  Despite that, Rachael Johnson platelet count is still elevated.  I will not make an adjustment right now.  Have Rachael Johnson come back in 4 weeks.  We will see how everything looks in 4 weeks.  If Rachael Johnson platelet count is not improved, then we will further increase the anagrelide dose.  Of note, there is the new indication for myelofibrosis that the FDA has approved.  We might be able to consider this.  [name], MD 10/31/201912:32 PM

## 2018-05-31 MED FILL — ANAGRELIDE HCL 1 MG CAPS: 1 | 30 days supply | Qty: 60 | Fill #1

## 2018-06-02 ENCOUNTER — Encounter: Payer: Self-pay | Admitting: Family Medicine

## 2018-06-03 ENCOUNTER — Other Ambulatory Visit: Payer: Self-pay | Admitting: Family Medicine

## 2018-06-03 ENCOUNTER — Encounter: Payer: Self-pay | Admitting: Hematology & Oncology

## 2018-06-03 ENCOUNTER — Telehealth: Payer: Self-pay | Admitting: Family Medicine

## 2018-06-03 ENCOUNTER — Encounter: Payer: Self-pay | Admitting: Family Medicine

## 2018-06-03 MED ORDER — CEFDINIR 300 MG PO CAPS: 300.0000 mg | ORAL_CAPSULE | Freq: Two times a day (BID) | ORAL | 0 refills | Status: AC

## 2018-06-03 MED ORDER — OXYCODONE-ACETAMINOPHEN 10-325 MG PO TABS: 1.0000 | ORAL_TABLET | Freq: Three times a day (TID) | ORAL | 0 refills | Status: DC | PRN

## 2018-06-03 MED ORDER — CYCLOBENZAPRINE HCL 10 MG PO TABS: 10.0000 mg | ORAL_TABLET | Freq: Two times a day (BID) | ORAL | 0 refills | Status: DC | PRN

## 2018-06-03 NOTE — Telephone Encounter (Signed)
Patient is calling to check on the status of her oxyCODONE-acetaminophen (PERCOCET) 10-325 MG tablet medication.

## 2018-06-03 NOTE — Telephone Encounter (Signed)
Copied from [place] [phone]. Topic: Quick Communication - See Telephone Encounter >> Jun 03, 2018  9:54 AM Ivar Drape wrote: CRM for notification. See Telephone encounter for: 06/03/18. Patient stated her cancer doctor said she would probably not get rid of her virus. She would like an antibiotic called in for that.

## 2018-06-03 NOTE — Telephone Encounter (Signed)
Pt is requesting refill on oxycodone.   Last OV: 05/06/2018 Last Fill: 05/06/2018 #120 and 0RF UDS: 05/06/2018 Low risk

## 2018-06-05 NOTE — Telephone Encounter (Signed)
Dr. Charlett Blake already sent in cefdinir. Pt. Was made aware via mychart.

## 2018-06-07 ENCOUNTER — Other Ambulatory Visit: Payer: Self-pay | Admitting: Family Medicine

## 2018-06-07 NOTE — Telephone Encounter (Signed)
Requested medication (s) are due for refill today: yes  Requested medication (s) are on the active medication list: yes  Last refill:  06/03/18, but pt states she is told no refill is available at the pharmacy  Future visit scheduled: yes  Notes to clinic:  Attempted to call [facility] in [place] x3 but no answer and call is disconnected automatically    Requested Prescriptions  Pending Prescriptions Disp Refills   oxyCODONE-acetaminophen (PERCOCET) 10-325 MG tablet 120 tablet 0    Sig: Take 1 tablet by mouth every 8 (eight) hours as needed for pain.     Not Delegated - Analgesics:  Opioid Agonist Combinations Failed - 06/07/2018  4:16 PM      Failed - This refill cannot be delegated      Failed - Urine Drug Screen completed in last 360 days.      Passed - Valid encounter within last 6 months    Recent Outpatient Visits          1 month ago High risk medication use   [facility] [place], MD   3 months ago Hypomagnesemia   [facility] [place], MD   5 months ago High risk medication use   [facility] [place], MD   7 months ago Breast cancer screening   [facility] [place], MD   9 months ago High risk medication use   [facility] [place], MD      Future Appointments            In 1 week Mosie Lukes, MD [facility] [place]

## 2018-06-07 NOTE — Telephone Encounter (Signed)
Copied from [place] [id number]. Topic: Quick Communication - Rx Refill/Question >> Jun 07, 2018  4:10 PM Reyne Dumas L wrote: Medication: oxyCODONE-acetaminophen (PERCOCET) 10-325 MG tablet  Has the patient contacted their pharmacy? Yes - states pharmacy doesn't have a refill on file.  Pt has no medication left (Agent: If no, request that the patient contact the pharmacy for the refill.) (Agent: If yes, when and what did the pharmacy advise?)  Preferred Pharmacy (with phone number or street name): [facility], [place] [phone] (Phone) [phone] (Fax)  Agent: Please be advised that RX refills may take up to 3 business days. We ask that you follow-up with your pharmacy.

## 2018-06-09 ENCOUNTER — Encounter: Payer: Self-pay | Admitting: Family Medicine

## 2018-06-09 ENCOUNTER — Encounter: Payer: Self-pay | Admitting: Hematology & Oncology

## 2018-06-10 ENCOUNTER — Other Ambulatory Visit: Payer: Self-pay | Admitting: Family Medicine

## 2018-06-10 DIAGNOSIS — M549 Dorsalgia, unspecified: Principal | ICD-10-CM

## 2018-06-10 DIAGNOSIS — G8929 Other chronic pain: Secondary | ICD-10-CM

## 2018-06-10 MED ORDER — OXYCODONE-ACETAMINOPHEN 10-325 MG PO TABS: 1.0000 | ORAL_TABLET | Freq: Three times a day (TID) | ORAL | 0 refills | Status: DC | PRN

## 2018-06-10 MED FILL — JAKAFI 20 MG TABLET: 20 | 30 days supply | Qty: 60 | Fill #2

## 2018-06-10 NOTE — Telephone Encounter (Signed)
Copied from [place] [id number]. Topic: General - Other >> Jun 10, 2018  9:37 AM Alfredia Ferguson R wrote: Patient wants to know if she can take oxyCODONE-acetaminophen (PERCOCET) 10-325 MG tablet q6s due to pain. Patient also wants to know if gabapentin (NEURONTIN) 100 MG capsule can be switched to 500mg  1 tab po every night.

## 2018-06-10 NOTE — Telephone Encounter (Signed)
Yes we can increase Oxycodone to q 6 hr pn. Can also increase the Gabapentin to 300 mg tab 1 tab po qhs, disp #30 with 1 rf and then can increase to 600 mg later if she tolerates it and she needs it.

## 2018-06-10 NOTE — Telephone Encounter (Signed)
Dr Charlett Blake -- please see phone note from today as well. Thanks!

## 2018-06-11 ENCOUNTER — Encounter: Payer: Self-pay | Admitting: Family Medicine

## 2018-06-11 MED ORDER — GABAPENTIN 300 MG PO CAPS: 300.0000 mg | ORAL_CAPSULE | Freq: Every day | ORAL | 1 refills | Status: DC

## 2018-06-11 MED ORDER — OXYCODONE-ACETAMINOPHEN 10-325 MG PO TABS: 1.0000 | ORAL_TABLET | Freq: Four times a day (QID) | ORAL | 0 refills | Status: DC | PRN

## 2018-06-11 NOTE — Telephone Encounter (Signed)
Author phoned pt. to relay medication changes, and pt. agreeable to plan. Pt. also asking to adjust flexeril, and author encouraged pt. To talk to Dr. Charlett Blake about that in person at her 11/18 appointment, and pt. agreeable. New gabapentin order sent to pharmacy; New percocet order pended for Dr. Charlett Blake review.

## 2018-06-17 ENCOUNTER — Ambulatory Visit (INDEPENDENT_AMBULATORY_CARE_PROVIDER_SITE_OTHER): Payer: Medicare Other | Admitting: Family Medicine

## 2018-06-17 VITALS — BP 119/73 | HR 97 | Temp 97.9°F | Resp 18 | Wt 159.0 lb

## 2018-06-17 DIAGNOSIS — M25511 Pain in right shoulder: Secondary | ICD-10-CM | POA: Diagnosis not present

## 2018-06-17 DIAGNOSIS — R350 Frequency of micturition: Secondary | ICD-10-CM

## 2018-06-17 DIAGNOSIS — F418 Other specified anxiety disorders: Secondary | ICD-10-CM

## 2018-06-17 DIAGNOSIS — R739 Hyperglycemia, unspecified: Secondary | ICD-10-CM

## 2018-06-17 DIAGNOSIS — R6883 Chills (without fever): Secondary | ICD-10-CM

## 2018-06-17 MED ORDER — VENLAFAXINE HCL ER 150 MG PO CP24: 150.0000 mg | ORAL_CAPSULE | Freq: Every day | ORAL | 1 refills | Status: DC

## 2018-06-17 MED ORDER — BUSPIRONE HCL 10 MG PO TABS: 10.0000 mg | ORAL_TABLET | Freq: Three times a day (TID) | ORAL | 1 refills | Status: DC

## 2018-06-17 MED ORDER — MONTELUKAST SODIUM 10 MG PO TABS: 10.0000 mg | ORAL_TABLET | Freq: Every day | ORAL | 2 refills | Status: DC

## 2018-06-17 MED ORDER — CETIRIZINE HCL 10 MG PO TABS: 10.0000 mg | ORAL_TABLET | Freq: Every day | ORAL | 1 refills | Status: DC

## 2018-06-17 MED ORDER — FLUTICASONE PROPIONATE 50 MCG/ACT NA SUSP: 2.0000 | Freq: Every day | NASAL | 3 refills | Status: DC

## 2018-06-17 MED ORDER — FLUCONAZOLE 150 MG PO TABS: 150.0000 mg | ORAL_TABLET | ORAL | 1 refills | Status: DC

## 2018-06-17 MED ORDER — ESTRADIOL 0.5 MG PO TABS: 0.5000 mg | ORAL_TABLET | Freq: Two times a day (BID) | ORAL | 1 refills | Status: DC

## 2018-06-17 MED ORDER — FAMOTIDINE 40 MG PO TABS: 40.0000 mg | ORAL_TABLET | Freq: Every day | ORAL | 1 refills | Status: DC

## 2018-06-17 NOTE — Patient Instructions (Signed)

## 2018-06-18 LAB — URINE CULTURE
MICRO NUMBER:: 91386093
Result:: NO GROWTH
SPECIMEN QUALITY:: ADEQUATE

## 2018-06-18 LAB — URINALYSIS
Bilirubin Urine: NEGATIVE
Hgb urine dipstick: NEGATIVE
Ketones, ur: NEGATIVE
Leukocytes, UA: NEGATIVE
Nitrite: NEGATIVE
Specific Gravity, Urine: 1.02 (ref 1.000–1.030)
Total Protein, Urine: NEGATIVE
Urine Glucose: NEGATIVE
Urobilinogen, UA: 0.2 (ref 0.0–1.0)
pH: 5 (ref 5.0–8.0)

## 2018-06-19 DIAGNOSIS — R350 Frequency of micturition: Secondary | ICD-10-CM | POA: Insufficient documentation

## 2018-06-19 NOTE — Assessment & Plan Note (Signed)
UA with culture is negative for UTI

## 2018-06-19 NOTE — Assessment & Plan Note (Signed)
Continues to struggle with diffuse daily pain and right shoulder is significant. Pain meds prn are helpful

## 2018-06-19 NOTE — Assessment & Plan Note (Signed)
Encouraged heart healthy diet, increase exercise, avoid trans fats, consider a krill oil cap daily 

## 2018-06-19 NOTE — Progress Notes (Signed)
Subjective:    Patient ID: Rachael Johnson, female    DOB: February 01, 1951, 67 y.o.   MRN: [id number]  Chief Complaint  Patient presents with  . Follow-up    HPI Patient is in today for follow up. She does not feel well. She endorses anhedonia, fatigue, malaise, myalgias and diffuse joint and back pain. Right shoulder is the worst. She has recently noted some intermittent chills with urinary frequency. No incontinence or dysuria. No hematuria. Denies CP/palp/SOB/HA/congestion/fevers/GI or GU c/o. Taking meds as prescribed  Past Medical History:  Diagnosis Date  . Acute renal insufficiency 04/09/2017  . Anemia, iron deficiency 07/08/2012  . Arthritis   . Cataract 12/21/2014  . Depression with anxiety 03/24/2011  . Diverticulitis   . Esophageal reflux 08/17/2013  . Essential thrombocythemia ([place]) 01/24/2011  . Frequent episodic tension-type headache   . Gastroenteritis 12/21/2014  . 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 GERD (gastroesophageal reflux disease)   . H. pylori infection 12/19/2012  . Hyperglycemia 12/17/2015  . Hypertension   . Iron deficiency anemia due to chronic blood loss 04/03/2017  . Osteoarthritis 05/24/2017  . Other and unspecified hyperlipidemia 02/25/2013  . Restless leg syndrome 09/30/2014  . Rhabdomyolysis 02/2017  . Scabies 03/28/2015  . 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 Seizure ([place])    childhood  . Shoulder wound, right, sequela 03/14/2017  . Thyroid disease     Past Surgical History:  Procedure Laterality Date  . ABDOMINAL HYSTERECTOMY     1992  . BREAST CYST ASPIRATION    . BREAST SURGERY  1992   biopsy, benign. Fibrocystic  . UMBILICAL HERNIA REPAIR N/A 09/25/2012   Procedure: HERNIA REPAIR UMBILICAL ADULT;  Surgeon: Harl Bowie, MD;  Location: [facility];  Service: General;  Laterality: N/A;    Family History  Problem Relation Age of Onset  . Arthritis Mother   . Cancer Mother        ovarian  . Hypertension Mother   . Heart disease Mother    pacer  . Heart failure Mother   . Arthritis Father   . Cancer Father        lung  . Hypertension Father   . Cancer Sister        lung  . Cancer Brother        prostate  . Cancer Brother        lung  . Hypertension Son   . COPD Brother   . Heart disease Brother        died from CHF  . Hypertension Brother   . Cancer Brother        colon  . Alcohol abuse Brother   . Cirrhosis Brother   . Seizures Sister   . Stroke Sister   . Arthritis Brother     Social History   Socioeconomic History  . Marital status: Widowed    Spouse name: Not on file  . Number of children: 2  . Years of education: Not on file  . Highest education level: Not on file  Occupational History  . Occupation: APL IT sales professional: [employer]    Comment: [place]  . Financial resource strain: Not on file  . Food insecurity:    Worry: Not on file    Inability: Not on file  . Transportation needs:    Medical: Not on file    Non-medical: Not on file  Tobacco Use  . Smoking status: Former Smoker  Packs/day: 3.50    Years: 20.00    Pack years: 70.00    Types: Cigarettes    Start date: 11/15/1970    Last attempt to quit: 07/31/1990    Years since quitting: 27.9  . Smokeless tobacco: Never Used  . Tobacco comment: quit 23 years ago  Substance and Sexual Activity  . Alcohol use: No    Alcohol/week: 0.0 standard drinks  . Drug use: No  . Sexual activity: Never  Lifestyle  . Physical activity:    Days per week: Not on file    Minutes per session: Not on file  . Stress: Not on file  Relationships  . Social connections:    Talks on phone: Not on file    Gets together: Not on file    Attends religious service: Not on file    Active member of club or organization: Not on file    Attends meetings of clubs or organizations: Not on file    Relationship status: Not on file  . Intimate partner violence:    Fear of current or ex partner: Not on file    Emotionally abused: Not on  file    Physically abused: Not on file    Forced sexual activity: Not on file  Other Topics Concern  . Not on file  Social History Narrative   Regular exercise: no   Caffeine Use: 2-3 weekly    Outpatient Medications Prior to Visit  Medication Sig Dispense Refill  . albuterol (VENTOLIN HFA) 108 (90 Base) MCG/ACT inhaler Inhale 2 puffs into the lungs every 6 (six) hours as needed for wheezing or shortness of breath. 54 Inhaler 1  . ALPRAZolam (XANAX) 1 MG tablet TAKE 1/2 TO 1 (ONE-HALF TO ONE) TABLET BY MOUTH THREE TIMES DAILY AS NEEDED FOR ANXIETY 90 tablet 1  . anagrelide (AGRYLIN) 1 MG capsule Take 1 capsule (1 mg total) by mouth 2 (two) times daily. 60 capsule 3  . aspirin EC 81 MG tablet Take 81 mg by mouth every morning.    Marland Kitchen atorvastatin (LIPITOR) 10 MG tablet Take 1 tablet (10 mg total) by mouth daily. 90 tablet 3  . budesonide-formoterol (SYMBICORT) 80-4.5 MCG/ACT inhaler Inhale 2 puffs into the lungs 2 (two) times daily. 10.2 g 5  . chlorpheniramine (CHLOR-TRIMETON) 4 MG tablet Take 8 mg by mouth at bedtime.    . Cranberry 300 MG tablet Take 300 mg by mouth 2 (two) times daily.    . cyclobenzaprine (FLEXERIL) 10 MG tablet Take 1 tablet (10 mg total) by mouth 2 (two) times daily as needed for muscle spasms. 60 tablet 0  . furosemide (LASIX) 20 MG tablet Take 1 tablet (20 mg total) by mouth 3 (three) times daily as needed for fluid or edema. 90 tablet 2  . gabapentin (NEURONTIN) 300 MG capsule Take 1 capsule (300 mg total) by mouth at bedtime. 30 capsule 1  . levothyroxine (SYNTHROID, LEVOTHROID) 100 MCG tablet Take 1 tablet (100 mcg total) by mouth daily before breakfast. 30 tablet 3  . magic mouthwash SOLN Take 5 mLs by mouth 3 (three) times daily as needed for mouth pain. Components benadryl  525 mg, hydrocortisone 60 mg and nystatin 0.6 mg. 240 ml - Oral 240 mL 3  . meclizine (ANTIVERT) 12.5 MG tablet Take 1 tablet (12.5 mg total) by mouth 3 (three) times daily as needed for  dizziness. 40 tablet 2  . Multiple Vitamin (MULTIVITAMIN PO) Take 1 tablet by mouth every morning.     Marland Kitchen  nystatin (MYCOSTATIN) 100000 UNIT/ML suspension     . ondansetron (ZOFRAN) 8 MG tablet TAKE 1/2 TO 1 TABLET BY MOUTH EVERY 8 HOURS AS NEEDED FOR NAUSEA AND VOMITING 90 tablet 1  . oxyCODONE-acetaminophen (PERCOCET) 10-325 MG tablet Take 1 tablet by mouth every 6 (six) hours as needed for pain. 120 tablet 0  . RESTASIS 0.05 % ophthalmic emulsion PLACE ONE DROP INTO EACH EYE EVERY 12 HOURS  3  . ruxolitinib phosphate (JAKAFI) 20 MG tablet Take 1 tablet (20 mg total) by mouth 2 (two) times daily. 60 tablet 4  . silver sulfADIAZINE (SILVADENE) 1 % cream Apply 1 application topically daily. 50 g 1  . busPIRone (BUSPAR) 10 MG tablet Take 1 tablet (10 mg total) by mouth 3 (three) times daily. 90 tablet 0  . busPIRone (BUSPAR) 10 MG tablet TAKE 1 TABLET BY MOUTH THREE TIMES DAILY 90 tablet 0  . cetirizine (ZYRTEC) 10 MG tablet Take 1 tablet (10 mg total) by mouth daily. 90 tablet 1  . estradiol (ESTRACE) 0.5 MG tablet Take 1 tablet (0.5 mg total) by mouth 2 (two) times daily. 180 tablet 1  . fluticasone (FLONASE) 50 MCG/ACT nasal spray Place 2 sprays into both nostrils daily. 48 g 3  . montelukast (SINGULAIR) 10 MG tablet Take 1 tablet (10 mg total) by mouth at bedtime. 90 tablet 2  . ranitidine (ZANTAC) 300 MG tablet Take 1 tablet (300 mg total) by mouth at bedtime. 90 tablet 1  . venlafaxine XR (EFFEXOR-XR) 150 MG 24 hr capsule TAKE 1 CAPSULE (150 MG TOTAL) BY MOUTH DAILY WITH BREAKFAST. 90 capsule 1   No facility-administered medications prior to visit.     No Known Allergies  Review of Systems  Constitutional: Positive for chills and malaise/fatigue. Negative for fever.  HENT: Negative for congestion.   Eyes: Negative for blurred vision.  Respiratory: Negative for shortness of breath.   Cardiovascular: Negative for chest pain, palpitations and leg swelling.  Gastrointestinal: Negative for  abdominal pain, blood in stool and nausea.  Genitourinary: Negative for dysuria and frequency.  Musculoskeletal: Positive for back pain, joint pain and myalgias. Negative for falls.  Skin: Negative for rash.  Neurological: Negative for dizziness, loss of consciousness and headaches.  Endo/Heme/Allergies: Negative for environmental allergies.  Psychiatric/Behavioral: Positive for depression. The patient is not nervous/anxious.        Objective:    Physical Exam  Constitutional: She is oriented to person, place, and time. She appears well-developed and well-nourished. No distress.  HENT:  Head: Normocephalic and atraumatic.  Nose: Nose normal.  Eyes: Right eye exhibits no discharge. Left eye exhibits no discharge.  Neck: Normal range of motion. Neck supple.  Cardiovascular: Normal rate and regular rhythm.  No murmur heard. Pulmonary/Chest: Effort normal and breath sounds normal.  Abdominal: Soft. Bowel sounds are normal. There is no tenderness.  Musculoskeletal: She exhibits no edema.  Neurological: She is alert and oriented to person, place, and time.  Skin: Skin is warm and dry.  Psychiatric: She has a normal mood and affect.  Nursing note and vitals reviewed.   BP 119/73 (BP Location: Left Arm, Patient Position: Sitting, Cuff Size: Normal)   Pulse 97   Temp 97.9 F (36.6 C) (Oral)   Resp 18   Wt 159 lb (72.1 kg)   LMP 07/31/1990   SpO2 97%   BMI 28.17 kg/m  Wt Readings from Last 3 Encounters:  06/17/18 159 lb (72.1 kg)  05/30/18 155 lb (70.3 kg)  05/06/18  157 lb 6.4 oz (71.4 kg)     Lab Results  Component Value Date   WBC 4.4 05/30/2018   HGB 10.0 (L) 05/30/2018   HCT 32.8 (L) 05/30/2018   PLT 667 (H) 05/30/2018   GLUCOSE 138 (H) 05/30/2018   CHOL 236 (H) 05/06/2018   TRIG 322.0 (H) 05/06/2018   HDL 59.00 05/06/2018   LDLDIRECT 95.0 05/06/2018   LDLCALC 82 02/12/2017   ALT 28 05/30/2018   AST 58 (H) 05/30/2018   NA 138 05/30/2018   K 3.7 05/30/2018   CL  102 05/30/2018   CREATININE 1.20 05/30/2018   BUN 11 05/30/2018   CO2 32 05/30/2018   TSH 0.02 (L) 05/06/2018   INR 0.95 11/16/2017   HGBA1C 5.0 08/10/2017    Lab Results  Component Value Date   TSH 0.02 (L) 05/06/2018   Lab Results  Component Value Date   WBC 4.4 05/30/2018   HGB 10.0 (L) 05/30/2018   HCT 32.8 (L) 05/30/2018   MCV 110.1 (H) 05/30/2018   PLT 667 (H) 05/30/2018   Lab Results  Component Value Date   NA 138 05/30/2018   K 3.7 05/30/2018   CHLORIDE 105 11/29/2016   CO2 32 05/30/2018   GLUCOSE 138 (H) 05/30/2018   BUN 11 05/30/2018   CREATININE 1.20 05/30/2018   BILITOT 0.4 05/30/2018   ALKPHOS 71 05/30/2018   AST 58 (H) 05/30/2018   ALT 28 05/30/2018   PROT 6.8 05/30/2018   ALBUMIN 3.6 05/30/2018   CALCIUM 8.4 05/30/2018   ANIONGAP 4 (L) 05/30/2018   EGFR 80 (L) 11/29/2016   GFR 62.50 05/06/2018   Lab Results  Component Value Date   CHOL 236 (H) 05/06/2018   Lab Results  Component Value Date   HDL 59.00 05/06/2018   Lab Results  Component Value Date   LDLCALC 82 02/12/2017   Lab Results  Component Value Date   TRIG 322.0 (H) 05/06/2018   Lab Results  Component Value Date   CHOLHDL 4 05/06/2018   Lab Results  Component Value Date   HGBA1C 5.0 08/10/2017       Assessment & Plan:   Problem List Items Addressed This Visit    Depression with anxiety   Relevant Medications   busPIRone (BUSPAR) 10 MG tablet   venlafaxine XR (EFFEXOR-XR) 150 MG 24 hr capsule   Hyperglycemia    Has been watching her carbohydrates and trying to stay active.       Right shoulder pain    Continues to struggle with diffuse daily pain and right shoulder is significant. Pain meds prn are helpful      Urinary frequency - Primary    UA with culture is negative for UTI      Relevant Orders   Urinalysis (Completed)   Urine Culture (Completed)    Other Visit Diagnoses    Chills       Relevant Orders   Urinalysis (Completed)   Urine Culture  (Completed)      I have discontinued Shakila D. Armel's ranitidine. I am also having her start on famotidine and fluconazole. Additionally, I am having her maintain her Multiple Vitamin (MULTIVITAMIN PO), aspirin EC, Cranberry, chlorpheniramine, RESTASIS, budesonide-formoterol, albuterol, magic mouthwash, nystatin, meclizine, silver sulfADIAZINE, ruxolitinib phosphate, ondansetron, furosemide, anagrelide, atorvastatin, levothyroxine, ALPRAZolam, cyclobenzaprine, gabapentin, oxyCODONE-acetaminophen, busPIRone, cetirizine, estradiol, fluticasone, montelukast, and venlafaxine XR.  Meds ordered this encounter  Medications  . busPIRone (BUSPAR) 10 MG tablet    Sig: Take 1 tablet (10 mg total)  by mouth 3 (three) times daily.    Dispense:  90 tablet    Refill:  1  . cetirizine (ZYRTEC) 10 MG tablet    Sig: Take 1 tablet (10 mg total) by mouth daily.    Dispense:  90 tablet    Refill:  1  . estradiol (ESTRACE) 0.5 MG tablet    Sig: Take 1 tablet (0.5 mg total) by mouth 2 (two) times daily.    Dispense:  180 tablet    Refill:  1  . fluticasone (FLONASE) 50 MCG/ACT nasal spray    Sig: Place 2 sprays into both nostrils daily.    Dispense:  48 g    Refill:  3  . montelukast (SINGULAIR) 10 MG tablet    Sig: Take 1 tablet (10 mg total) by mouth at bedtime.    Dispense:  90 tablet    Refill:  2  . famotidine (PEPCID) 40 MG tablet    Sig: Take 1 tablet (40 mg total) by mouth daily.    Dispense:  90 tablet    Refill:  1  . venlafaxine XR (EFFEXOR-XR) 150 MG 24 hr capsule    Sig: Take 1 capsule (150 mg total) by mouth daily with breakfast.    Dispense:  90 capsule    Refill:  1  . fluconazole (DIFLUCAN) 150 MG tablet    Sig: Take 1 tablet (150 mg total) by mouth once a week.    Dispense:  2 tablet    Refill:  1     Penni Homans, MD

## 2018-06-19 NOTE — Assessment & Plan Note (Signed)
Has been watching her carbohydrates and trying to stay active.

## 2018-07-02 ENCOUNTER — Other Ambulatory Visit: Payer: Self-pay | Admitting: *Deleted

## 2018-07-02 ENCOUNTER — Other Ambulatory Visit: Payer: Medicare Other

## 2018-07-02 ENCOUNTER — Ambulatory Visit: Payer: Medicare Other | Admitting: Hematology & Oncology

## 2018-07-02 DIAGNOSIS — D473 Essential (hemorrhagic) thrombocythemia: Secondary | ICD-10-CM

## 2018-07-02 MED ORDER — ANAGRELIDE HCL 1 MG PO CAPS: 1.0000 mg | ORAL_CAPSULE | Freq: Two times a day (BID) | ORAL | 3 refills | Status: DC

## 2018-07-02 MED FILL — ANAGRELIDE HCL 1 MG CAPS: 1 | 30 days supply | Qty: 60 | Fill #2

## 2018-07-03 MED FILL — JAKAFI 20 MG TABLET: 20 | 30 days supply | Qty: 60 | Fill #3

## 2018-07-05 ENCOUNTER — Inpatient Hospital Stay (HOSPITAL_BASED_OUTPATIENT_CLINIC_OR_DEPARTMENT_OTHER): Payer: Medicare Other | Admitting: Hematology & Oncology

## 2018-07-05 ENCOUNTER — Other Ambulatory Visit: Payer: Self-pay

## 2018-07-05 ENCOUNTER — Inpatient Hospital Stay: Payer: Medicare Other | Attending: Hematology & Oncology

## 2018-07-05 ENCOUNTER — Encounter: Payer: Self-pay | Admitting: Hematology & Oncology

## 2018-07-05 ENCOUNTER — Telehealth: Payer: Self-pay | Admitting: Hematology & Oncology

## 2018-07-05 VITALS — BP 139/72 | HR 119 | Temp 98.4°F | Resp 17 | Wt 152.1 lb

## 2018-07-05 DIAGNOSIS — D509 Iron deficiency anemia, unspecified: Secondary | ICD-10-CM

## 2018-07-05 DIAGNOSIS — D7581 Myelofibrosis: Secondary | ICD-10-CM

## 2018-07-05 DIAGNOSIS — G8929 Other chronic pain: Secondary | ICD-10-CM | POA: Diagnosis not present

## 2018-07-05 DIAGNOSIS — D500 Iron deficiency anemia secondary to blood loss (chronic): Secondary | ICD-10-CM

## 2018-07-05 DIAGNOSIS — M25511 Pain in right shoulder: Secondary | ICD-10-CM | POA: Insufficient documentation

## 2018-07-05 DIAGNOSIS — Z7982 Long term (current) use of aspirin: Secondary | ICD-10-CM

## 2018-07-05 DIAGNOSIS — D473 Essential (hemorrhagic) thrombocythemia: Secondary | ICD-10-CM | POA: Diagnosis not present

## 2018-07-05 DIAGNOSIS — Z79899 Other long term (current) drug therapy: Secondary | ICD-10-CM | POA: Insufficient documentation

## 2018-07-05 LAB — IRON AND TIBC
Iron: 120 ug/dL (ref 41–142)
Saturation Ratios: 53 % (ref 21–57)
TIBC: 229 ug/dL — ABNORMAL LOW (ref 236–444)
UIBC: 108 ug/dL — ABNORMAL LOW (ref 120–384)

## 2018-07-05 LAB — CBC WITH DIFFERENTIAL (CANCER CENTER ONLY)
Abs Immature Granulocytes: 0.06 10*3/uL (ref 0.00–0.07)
Basophils Absolute: 0 10*3/uL (ref 0.0–0.1)
Basophils Relative: 0 %
Eosinophils Absolute: 0.1 10*3/uL (ref 0.0–0.5)
Eosinophils Relative: 2 %
HCT: 36.4 % (ref 36.0–46.0)
Hemoglobin: 11.2 g/dL — ABNORMAL LOW (ref 12.0–15.0)
Immature Granulocytes: 1 %
Lymphocytes Relative: 29 %
Lymphs Abs: 1.7 10*3/uL (ref 0.7–4.0)
MCH: 34 pg (ref 26.0–34.0)
MCHC: 30.8 g/dL (ref 30.0–36.0)
MCV: 110.6 fL — ABNORMAL HIGH (ref 80.0–100.0)
Monocytes Absolute: 0.5 10*3/uL (ref 0.1–1.0)
Monocytes Relative: 9 %
Neutro Abs: 3.4 10*3/uL (ref 1.7–7.7)
Neutrophils Relative %: 59 %
Platelet Count: 895 10*3/uL — ABNORMAL HIGH (ref 150–400)
RBC: 3.29 MIL/uL — ABNORMAL LOW (ref 3.87–5.11)
RDW: 13.9 % (ref 11.5–15.5)
WBC Count: 5.8 10*3/uL (ref 4.0–10.5)
nRBC: 0 % (ref 0.0–0.2)

## 2018-07-05 LAB — CMP (CANCER CENTER ONLY)
ALT: 18 U/L (ref 0–44)
AST: 30 U/L (ref 15–41)
Albumin: 4.7 g/dL (ref 3.5–5.0)
Alkaline Phosphatase: 98 U/L (ref 38–126)
Anion gap: 10 (ref 5–15)
BUN: 13 mg/dL (ref 8–23)
CO2: 31 mmol/L (ref 22–32)
Calcium: 9.4 mg/dL (ref 8.9–10.3)
Chloride: 100 mmol/L (ref 98–111)
Creatinine: 0.93 mg/dL (ref 0.44–1.00)
GFR, Est AFR Am: >60 mL/min (ref >60)
GFR, Estimated: >60 mL/min (ref >60)
Glucose, Bld: 105 mg/dL — ABNORMAL HIGH (ref 70–99)
Potassium: 3.5 mmol/L (ref 3.5–5.1)
Sodium: 141 mmol/L (ref 135–145)
Total Bilirubin: 0.3 mg/dL (ref 0.3–1.2)
Total Protein: 7.9 g/dL (ref 6.5–8.1)

## 2018-07-05 LAB — FERRITIN: Ferritin: 1286 ng/mL — ABNORMAL HIGH (ref 11–307)

## 2018-07-05 LAB — LACTATE DEHYDROGENASE: LDH: 337 U/L — ABNORMAL HIGH (ref 98–192)

## 2018-07-05 MED ORDER — ANAGRELIDE HCL 1 MG PO CAPS: 3.0000 mg | ORAL_CAPSULE | Freq: Two times a day (BID) | ORAL | 3 refills | Status: DC

## 2018-07-05 NOTE — Telephone Encounter (Signed)
Appointments scheduled per 12/6 los

## 2018-07-05 NOTE — Progress Notes (Signed)
Hematology and Oncology Follow Up Visit  Rachael Johnson [id number] 1950-12-27 67 y.o. 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  We are still having an acrylic difficult time with her platelets.  Her platelet count is almost 900,000.  She is on 5 mg of anagrelide.  I am very surprised that this has not worked.  We are going to get a CT scan to see what might be going on.  I do not know if there is something else that is triggering and driving this thrombocytosis.  We may have to consider another bone marrow biopsy on her.  Her last one was back in April.  I am just very disappointed that we have not be able to control her thrombocythemia.  She is on Jakafi 20 mg p.o. twice daily.  I thought this might help even though she is JAK 2 (-).  I think that the next treatment option that we have for her is interferon.  This might be a decent idea.  I realize that this would involve injections which she is not too happy about.  Her appetite is okay.  She just does not have a lot of energy.  The holidays are always a tough time for her as she still misses her husband who passed away probably 8 years ago.  She still is having some pain issues.  Her right shoulder is still bothering her.  This is where she fell.  She was hospitalized because she had a bad open wound that took quite a while to heal.  Her iron levels have been okay.  Today, her ferritin was 1286 with an iron saturation of 53%.  Overall, her performance status is ECOG 1.     Medications:  Allergies as of  07/05/2018   No Known Allergies     Medication List        Accurate as of 07/05/18 11:19 AM. Always use your most recent med list.          albuterol 108 (90 Base) MCG/ACT inhaler Commonly known as:  PROVENTIL HFA;VENTOLIN HFA Inhale 2 puffs into the lungs every 6 (six) hours as needed for wheezing or shortness of breath.   ALPRAZolam 1 MG tablet Commonly known as:  XANAX TAKE 1/2 TO 1 (ONE-HALF TO ONE) TABLET BY MOUTH THREE TIMES DAILY AS NEEDED FOR ANXIETY   anagrelide 1 MG capsule Commonly known as:  AGRYLIN Take 1 capsule (1 mg total) by mouth 2 (two) times daily.   aspirin EC 81 MG tablet Take 81 mg by mouth every morning.   atorvastatin 10 MG tablet Commonly known as:  LIPITOR Take 1 tablet (10 mg total) by mouth daily.   budesonide-formoterol 80-4.5 MCG/ACT inhaler Commonly known as:  SYMBICORT Inhale 2 puffs into the lungs 2 (two) times daily.   busPIRone 10 MG tablet Commonly known as:  BUSPAR Take 1 tablet (10 mg total) by mouth 3 (three) times daily.   cetirizine 10 MG tablet Commonly known as:  ZYRTEC Take 1 tablet (10 mg total) by mouth daily.   chlorpheniramine 4  MG tablet Commonly known as:  CHLOR-TRIMETON Take 8 mg by mouth at bedtime.   Cranberry 300 MG tablet Take 300 mg by mouth 2 (two) times daily.   cyclobenzaprine 10 MG tablet Commonly known as:  FLEXERIL Take 1 tablet (10 mg total) by mouth 2 (two) times daily as needed for muscle spasms.   estradiol 0.5 MG tablet Commonly known as:  ESTRACE Take 1 tablet (0.5 mg total) by mouth 2 (two) times daily.   famotidine 40 MG tablet Commonly known as:  PEPCID Take 1 tablet (40 mg total) by mouth daily.   fluconazole 150 MG tablet Commonly known as:  DIFLUCAN Take 1 tablet (150 mg total) by mouth once a week.   fluticasone 50 MCG/ACT nasal spray Commonly known as:  FLONASE Place 2 sprays into both nostrils daily.   furosemide 20 MG tablet Commonly known as:  LASIX Take 1 tablet (20  mg total) by mouth 3 (three) times daily as needed for fluid or edema.   gabapentin 300 MG capsule Commonly known as:  NEURONTIN Take 1 capsule (300 mg total) by mouth at bedtime.   levothyroxine 100 MCG tablet Commonly known as:  SYNTHROID, LEVOTHROID Take 1 tablet (100 mcg total) by mouth daily before breakfast.   magic mouthwash Soln Take 5 mLs by mouth 3 (three) times daily as needed for mouth pain. Components benadryl  525 mg, hydrocortisone 60 mg and nystatin 0.6 mg. 240 ml - Oral   meclizine 12.5 MG tablet Commonly known as:  ANTIVERT Take 1 tablet (12.5 mg total) by mouth 3 (three) times daily as needed for dizziness.   montelukast 10 MG tablet Commonly known as:  SINGULAIR Take 1 tablet (10 mg total) by mouth at bedtime.   MULTIVITAMIN PO Take 1 tablet by mouth every morning.   nystatin 100000 UNIT/ML suspension Commonly known as:  MYCOSTATIN   ondansetron 8 MG tablet Commonly known as:  ZOFRAN TAKE 1/2 TO 1 TABLET BY MOUTH EVERY 8 HOURS AS NEEDED FOR NAUSEA AND VOMITING   oxyCODONE-acetaminophen 10-325 MG tablet Commonly known as:  PERCOCET Take 1 tablet by mouth every 6 (six) hours as needed for pain.   RESTASIS 0.05 % ophthalmic emulsion Generic drug:  cycloSPORINE PLACE ONE DROP INTO EACH EYE EVERY 12 HOURS   ruxolitinib phosphate 20 MG tablet Commonly known as:  JAKAFI Take 1 tablet (20 mg total) by mouth 2 (two) times daily.   silver sulfADIAZINE 1 % cream Commonly known as:  SILVADENE Apply 1 application topically daily.   venlafaxine XR 150 MG 24 hr capsule Commonly known as:  EFFEXOR-XR Take 1 capsule (150 mg total) by mouth daily with breakfast.       Allergies: No Known Allergies  Past Medical History, Surgical history, Social history, and Family History were reviewed and updated.  Review of Systems: Review of Systems  Constitutional: Positive for diaphoresis and malaise/fatigue.  HENT: Negative.   Eyes: Negative.   Respiratory:  Positive for shortness of breath.   Cardiovascular: Negative.   Gastrointestinal: Positive for abdominal pain, heartburn and nausea.  Genitourinary: Negative.   Musculoskeletal: Positive for joint pain and myalgias.  Skin: Negative.   Neurological: Positive for dizziness.  Endo/Heme/Allergies: Negative.   Psychiatric/Behavioral: Negative.      Physical Exam:  weight is 152 lb 1.9 oz (69 kg). Her oral temperature is 98.4 F (36.9 C). Her blood pressure is 139/72 and her pulse is 119 (abnormal). Her respiration is 17 and oxygen saturation is 97%.  Wt Readings from Last 3 Encounters:  07/05/18 152 lb 1.9 oz (69 kg)  06/17/18 159 lb (72.1 kg)  05/30/18 155 lb (70.3 kg)    Physical Exam  Constitutional: She is oriented to person, place, and time.  HENT:  Head: Normocephalic and atraumatic.  Mouth/Throat: Oropharynx is clear and moist.  Eyes: Pupils are equal, round, and reactive to light. EOM are normal.  Neck: Normal range of motion.  Cardiovascular: Normal rate, regular rhythm and normal heart sounds.  Pulmonary/Chest: Effort normal and breath sounds normal.  Abdominal: Soft. Bowel sounds are normal.  Her spleen tip is palpable a couple centimeters below the left costal margin.  Musculoskeletal: Normal range of motion. She exhibits no edema, tenderness or deformity.  Lymphadenopathy:    She has no cervical adenopathy.  Neurological: She is alert and oriented to person, place, and time.  Skin: Skin is warm and dry. No rash noted. No erythema.  Psychiatric: She has a normal mood and affect. Her behavior is normal. Judgment and thought content normal.  Vitals reviewed.    Lab Results  Component Value Date   WBC 5.8 07/05/2018   HGB 11.2 (L) 07/05/2018   HCT 36.4 07/05/2018   MCV 110.6 (H) 07/05/2018   PLT 895 (H) 07/05/2018   Lab Results  Component Value Date   FERRITIN 1,180 (H) 04/26/2018   IRON 63 04/26/2018   TIBC 238 04/26/2018   UIBC 174 04/26/2018    IRONPCTSAT 27 04/26/2018   Lab Results  Component Value Date   RETICCTPCT 2.2 (H) 03/22/2018   RBC 3.29 (L) 07/05/2018   RETICCTABS 45.8 06/02/2015   No results found for: KPAFRELGTCHN, LAMBDASER, KAPLAMBRATIO No results found for: Kandis Cocking, IGMSERUM No results found for: Odetta Pink, SPEI   Chemistry      Component Value Date/Time   NA 141 07/05/2018 1019   NA 143 07/16/2017 1057   NA 140 11/29/2016 0936   K 3.5 07/05/2018 1019   K 3.2 (L) 07/16/2017 1057   K 4.2 11/29/2016 0936   CL 100 07/05/2018 1019   CL 104 07/16/2017 1057   CO2 31 07/05/2018 1019   CO2 25 07/16/2017 1057   CO2 27 11/29/2016 0936   BUN 13 07/05/2018 1019   BUN 10 07/16/2017 1057   BUN 18.3 11/29/2016 0936   CREATININE 0.93 07/05/2018 1019   CREATININE 0.8 07/16/2017 1057   CREATININE 0.8 11/29/2016 0936      Component Value Date/Time   CALCIUM 9.4 07/05/2018 1019   CALCIUM 8.9 07/16/2017 1057   CALCIUM 8.9 11/29/2016 0936   ALKPHOS 98 07/05/2018 1019   ALKPHOS 71 07/16/2017 1057   ALKPHOS 77 11/29/2016 0936   AST 30 07/05/2018 1019   AST 19 11/29/2016 0936   ALT 18 07/05/2018 1019   ALT 27 07/16/2017 1057   ALT 10 11/29/2016 0936   BILITOT 0.3 07/05/2018 1019   BILITOT <0.22 11/29/2016 0936      Impression and Plan: Ms. Arterburn is a very pleasant 67 yo caucasian female with essential thrombocythemia.   I am a little bit disappointed that her platelet count has not improved.  I will increase her anagrelide dose to 3 mg twice a day.  She will take this in addition to the [place].  I have thought about referring her to [facility] to see if the esteemed myeloproliferative expert, Dr. Annabelle Harman, can help [place] out.  She really would not prefer to go to [facility].  I  told her that [facility] would just help [place] out and she would not have to stay there for treatment.  This made her feel a little bit better.  I will plan to see her back in 3 weeks.  If I do not  see any improvement in her platelets, then we might consider another bone marrow biopsy.  I have not forgotten about the use of interferon.  Ms. Pichon has a very complicated hematologic condition.  I spent a good 45 minutes with her.  All the time spent face to face.    [name], MD 12/6/201911:19 AM

## 2018-07-10 ENCOUNTER — Ambulatory Visit (HOSPITAL_BASED_OUTPATIENT_CLINIC_OR_DEPARTMENT_OTHER)
Admission: RE | Admit: 2018-07-10 | Discharge: 2018-07-10 | Disposition: A | Discharge status: Home / Self Care | Payer: Medicare Other | Source: Ambulatory Visit | Attending: Hematology & Oncology | Admitting: Hematology & Oncology

## 2018-07-10 ENCOUNTER — Encounter (HOSPITAL_BASED_OUTPATIENT_CLINIC_OR_DEPARTMENT_OTHER): Payer: Self-pay

## 2018-07-10 ENCOUNTER — Encounter: Payer: Self-pay | Admitting: *Deleted

## 2018-07-10 DIAGNOSIS — R918 Other nonspecific abnormal finding of lung field: Secondary | ICD-10-CM | POA: Diagnosis not present

## 2018-07-10 DIAGNOSIS — D473 Essential (hemorrhagic) thrombocythemia: Secondary | ICD-10-CM

## 2018-07-10 DIAGNOSIS — N200 Calculus of kidney: Secondary | ICD-10-CM | POA: Diagnosis not present

## 2018-07-10 DIAGNOSIS — J479 Bronchiectasis, uncomplicated: Secondary | ICD-10-CM | POA: Diagnosis not present

## 2018-07-10 DIAGNOSIS — I700 Atherosclerosis of aorta: Secondary | ICD-10-CM | POA: Insufficient documentation

## 2018-07-10 DIAGNOSIS — N281 Cyst of kidney, acquired: Secondary | ICD-10-CM | POA: Diagnosis not present

## 2018-07-10 MED ORDER — IOPAMIDOL (ISOVUE-300) INJECTION 61%
100.0000 mL | Freq: Once | INTRAVENOUS | Status: AC | PRN
  Administered 2018-07-10: 100 mL via INTRAVENOUS

## 2018-07-12 ENCOUNTER — Encounter: Payer: Self-pay | Admitting: Family Medicine

## 2018-07-15 ENCOUNTER — Other Ambulatory Visit: Payer: Self-pay | Admitting: Family Medicine

## 2018-07-15 DIAGNOSIS — G8929 Other chronic pain: Secondary | ICD-10-CM

## 2018-07-15 DIAGNOSIS — M549 Dorsalgia, unspecified: Principal | ICD-10-CM

## 2018-07-15 MED ORDER — OXYCODONE-ACETAMINOPHEN 10-325 MG PO TABS: 1.0000 | ORAL_TABLET | Freq: Four times a day (QID) | ORAL | 0 refills | Status: DC | PRN

## 2018-07-17 LAB — MYELOPROLIFERATIVE NEOPLASM PANEL

## 2018-07-26 ENCOUNTER — Inpatient Hospital Stay (HOSPITAL_BASED_OUTPATIENT_CLINIC_OR_DEPARTMENT_OTHER): Payer: Medicare Other | Admitting: Hematology & Oncology

## 2018-07-26 ENCOUNTER — Other Ambulatory Visit: Payer: Self-pay

## 2018-07-26 ENCOUNTER — Inpatient Hospital Stay: Payer: Medicare Other

## 2018-07-26 ENCOUNTER — Encounter: Payer: Self-pay | Admitting: Hematology & Oncology

## 2018-07-26 VITALS — BP 154/70 | HR 82 | Temp 98.2°F | Resp 18 | Wt 162.0 lb

## 2018-07-26 DIAGNOSIS — D7581 Myelofibrosis: Secondary | ICD-10-CM | POA: Diagnosis not present

## 2018-07-26 DIAGNOSIS — Z79899 Other long term (current) drug therapy: Secondary | ICD-10-CM | POA: Diagnosis not present

## 2018-07-26 DIAGNOSIS — D473 Essential (hemorrhagic) thrombocythemia: Secondary | ICD-10-CM

## 2018-07-26 DIAGNOSIS — D509 Iron deficiency anemia, unspecified: Secondary | ICD-10-CM | POA: Diagnosis not present

## 2018-07-26 DIAGNOSIS — G8929 Other chronic pain: Secondary | ICD-10-CM

## 2018-07-26 DIAGNOSIS — Z7982 Long term (current) use of aspirin: Secondary | ICD-10-CM | POA: Diagnosis not present

## 2018-07-26 DIAGNOSIS — M25511 Pain in right shoulder: Secondary | ICD-10-CM | POA: Diagnosis not present

## 2018-07-26 LAB — CMP (CANCER CENTER ONLY)
ALT: 16 U/L (ref 0–44)
AST: 25 U/L (ref 15–41)
Albumin: 3.9 g/dL (ref 3.5–5.0)
Alkaline Phosphatase: 70 U/L (ref 38–126)
Anion gap: 5 (ref 5–15)
BUN: 11 mg/dL (ref 8–23)
CO2: 31 mmol/L (ref 22–32)
Calcium: 8.5 mg/dL — ABNORMAL LOW (ref 8.9–10.3)
Chloride: 103 mmol/L (ref 98–111)
Creatinine: 0.93 mg/dL (ref 0.44–1.00)
GFR, Est AFR Am: >60 mL/min (ref >60)
GFR, Estimated: >60 mL/min (ref >60)
Glucose, Bld: 92 mg/dL (ref 70–99)
Potassium: 3.9 mmol/L (ref 3.5–5.1)
Sodium: 139 mmol/L (ref 135–145)
Total Bilirubin: 0.2 mg/dL — ABNORMAL LOW (ref 0.3–1.2)
Total Protein: 7 g/dL (ref 6.5–8.1)

## 2018-07-26 LAB — CBC WITH DIFFERENTIAL (CANCER CENTER ONLY)
Abs Immature Granulocytes: 0.03 10*3/uL (ref 0.00–0.07)
Basophils Absolute: 0 10*3/uL (ref 0.0–0.1)
Basophils Relative: 1 %
Eosinophils Absolute: 0.3 10*3/uL (ref 0.0–0.5)
Eosinophils Relative: 5 %
HCT: 32.7 % — ABNORMAL LOW (ref 36.0–46.0)
Hemoglobin: 9.9 g/dL — ABNORMAL LOW (ref 12.0–15.0)
Immature Granulocytes: 1 %
Lymphocytes Relative: 17 %
Lymphs Abs: 1 10*3/uL (ref 0.7–4.0)
MCH: 34 pg (ref 26.0–34.0)
MCHC: 30.3 g/dL (ref 30.0–36.0)
MCV: 112.4 fL — ABNORMAL HIGH (ref 80.0–100.0)
Monocytes Absolute: 0.5 10*3/uL (ref 0.1–1.0)
Monocytes Relative: 9 %
Neutro Abs: 4.3 10*3/uL (ref 1.7–7.7)
Neutrophils Relative %: 67 %
Platelet Count: 478 10*3/uL — ABNORMAL HIGH (ref 150–400)
RBC: 2.91 MIL/uL — ABNORMAL LOW (ref 3.87–5.11)
RDW: 13.3 % (ref 11.5–15.5)
WBC Count: 6.2 10*3/uL (ref 4.0–10.5)
nRBC: 0 % (ref 0.0–0.2)

## 2018-07-26 LAB — RETICULOCYTES
Immature Retic Fract: 14.7 % (ref 2.3–15.9)
RBC.: 2.91 MIL/uL — ABNORMAL LOW (ref 3.87–5.11)
Retic Count, Absolute: 56.7 10*3/uL (ref 19.0–186.0)
Retic Ct Pct: 2 % (ref 0.4–3.1)

## 2018-07-26 LAB — SAVE SMEAR(SSMR), FOR PROVIDER SLIDE REVIEW

## 2018-07-26 LAB — IRON AND TIBC
Iron: 50 ug/dL (ref 41–142)
Saturation Ratios: 24 % (ref 21–57)
TIBC: 206 ug/dL — ABNORMAL LOW (ref 236–444)
UIBC: 157 ug/dL (ref 120–384)

## 2018-07-26 LAB — FERRITIN: Ferritin: 861 ng/mL — ABNORMAL HIGH (ref 11–307)

## 2018-07-26 LAB — LACTATE DEHYDROGENASE: LDH: 276 U/L — ABNORMAL HIGH (ref 98–192)

## 2018-07-26 NOTE — Progress Notes (Signed)
Hematology and Oncology Follow Up Visit  Rachael Johnson [id number] 10-27-1950 67 y.o. 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  She comes in with 1 of her granddaughters.  As always, it is a lot of fun talking to her granddaughter.  Finally, her platelet count is responding.  We increased her anagrelide to 6 mg daily.  Her platelet count has come down to 498,000.  Hopefully, it will continue to improve or at least stabilize.  She actually did enjoy Christmas.  I know that the holidays can be tough for her as she still misses her husband.  He passed away 8 years ago.  She has had no nausea or vomiting.  There is been no cough.  There is been no rashes.  She has had no change in bowel or bladder habits.  Her appetite is doing relatively well.  There is been no problems with increased pain.  She has chronic pain in the right shoulder.  This I think will always be with her.   Her iron studies back in early December showed a ferritin of 1286 with an iron saturation of 53%.  Overall, her performance status is ECOG 1.     Medications:  Allergies as of 07/26/2018   No Known Allergies     Medication List       Accurate as of July 26, 2018  9:17 AM. Always use your most recent med list.        albuterol 108 (90 Base) MCG/ACT inhaler Commonly known as:  VENTOLIN HFA Inhale 2 puffs into the lungs every 6 (six) hours as needed for wheezing or shortness of breath.   ALPRAZolam 1 MG tablet Commonly known as:  XANAX TAKE 1/2 TO 1 (ONE-HALF TO ONE)  TABLET BY MOUTH THREE TIMES DAILY AS NEEDED FOR ANXIETY   anagrelide 1 MG capsule Commonly known as:  AGRYLIN Take 3 capsules (3 mg total) by mouth 2 (two) times daily.   aspirin EC 81 MG tablet Take 81 mg by mouth every morning.   atorvastatin 10 MG tablet Commonly known as:  LIPITOR Take 1 tablet (10 mg total) by mouth daily.   budesonide-formoterol 80-4.5 MCG/ACT inhaler Commonly known as:  SYMBICORT Inhale 2 puffs into the lungs 2 (two) times daily.   busPIRone 10 MG tablet Commonly known as:  BUSPAR Take 1 tablet (10 mg total) by mouth 3 (three) times daily.   cetirizine 10 MG tablet Commonly known as:  ZYRTEC Take 1 tablet (10 mg total) by mouth daily.   chlorpheniramine 4 MG tablet Commonly known as:  CHLOR-TRIMETON Take 8 mg by mouth at bedtime.   Cranberry 300 MG tablet Take 300 mg by mouth 2 (two) times daily.   cyclobenzaprine 10 MG tablet Commonly known as:  FLEXERIL Take 1 tablet (10 mg total) by mouth 2 (two) times daily as needed for muscle spasms.   estradiol 0.5 MG tablet Commonly known as:  ESTRACE Take 1 tablet (0.5 mg total) by mouth 2 (two) times daily.   famotidine 40 MG tablet Commonly known  as:  PEPCID Take 1 tablet (40 mg total) by mouth daily.   fluconazole 150 MG tablet Commonly known as:  DIFLUCAN Take 1 tablet (150 mg total) by mouth once a week.   fluticasone 50 MCG/ACT nasal spray Commonly known as:  FLONASE Place 2 sprays into both nostrils daily.   furosemide 20 MG tablet Commonly known as:  LASIX Take 1 tablet (20 mg total) by mouth 3 (three) times daily as needed for fluid or edema.   gabapentin 300 MG capsule Commonly known as:  NEURONTIN Take 1 capsule (300 mg total) by mouth at bedtime.   levothyroxine 100 MCG tablet Commonly known as:  SYNTHROID, LEVOTHROID Take 1 tablet (100 mcg total) by mouth daily before breakfast.   magic mouthwash Soln Take 5 mLs by mouth 3 (three) times daily as needed for mouth pain.  Components benadryl  525 mg, hydrocortisone 60 mg and nystatin 0.6 mg. 240 ml - Oral   meclizine 12.5 MG tablet Commonly known as:  ANTIVERT Take 1 tablet (12.5 mg total) by mouth 3 (three) times daily as needed for dizziness.   montelukast 10 MG tablet Commonly known as:  SINGULAIR Take 1 tablet (10 mg total) by mouth at bedtime.   MULTIVITAMIN PO Take 1 tablet by mouth every morning.   nystatin 100000 UNIT/ML suspension Commonly known as:  MYCOSTATIN   ondansetron 8 MG tablet Commonly known as:  ZOFRAN TAKE 1/2 TO 1 TABLET BY MOUTH EVERY 8 HOURS AS NEEDED FOR NAUSEA AND VOMITING   oxyCODONE-acetaminophen 10-325 MG tablet Commonly known as:  PERCOCET Take 1 tablet by mouth every 6 (six) hours as needed for pain.   RESTASIS 0.05 % ophthalmic emulsion Generic drug:  cycloSPORINE PLACE ONE DROP INTO EACH EYE EVERY 12 HOURS   ruxolitinib phosphate 20 MG tablet Commonly known as:  JAKAFI Take 1 tablet (20 mg total) by mouth 2 (two) times daily.   silver sulfADIAZINE 1 % cream Commonly known as:  SILVADENE Apply 1 application topically daily.   venlafaxine XR 150 MG 24 hr capsule Commonly known as:  EFFEXOR-XR Take 1 capsule (150 mg total) by mouth daily with breakfast.       Allergies: No Known Allergies  Past Medical History, Surgical history, Social history, and Family History were reviewed and updated.  Review of Systems: Review of Systems  Constitutional: Positive for diaphoresis and malaise/fatigue.  HENT: Negative.   Eyes: Negative.   Respiratory: Positive for shortness of breath.   Cardiovascular: Negative.   Gastrointestinal: Positive for abdominal pain, heartburn and nausea.  Genitourinary: Negative.   Musculoskeletal: Positive for joint pain and myalgias.  Skin: Negative.   Neurological: Positive for dizziness.  Endo/Heme/Allergies: Negative.   Psychiatric/Behavioral: Negative.      Physical Exam:  weight is 162 lb (73.5 kg). Her oral temperature  is 98.2 F (36.8 C). Her blood pressure is 154/70 (abnormal) and her pulse is 82. Her respiration is 18 and oxygen saturation is 98%.   Wt Readings from Last 3 Encounters:  07/26/18 162 lb (73.5 kg)  07/05/18 152 lb 1.9 oz (69 kg)  06/17/18 159 lb (72.1 kg)    Physical Exam Vitals signs reviewed.  HENT:     Head: Normocephalic and atraumatic.  Eyes:     Pupils: Pupils are equal, round, and reactive to light.  Neck:     Musculoskeletal: Normal range of motion.  Cardiovascular:     Rate and Rhythm: Normal rate and regular rhythm.     Heart sounds:  Normal heart sounds.  Pulmonary:     Effort: Pulmonary effort is normal.     Breath sounds: Normal breath sounds.  Abdominal:     General: Bowel sounds are normal.     Palpations: Abdomen is soft.     Comments: Her spleen tip is palpable a couple centimeters below the left costal margin.  Musculoskeletal: Normal range of motion.        General: No tenderness or deformity.  Lymphadenopathy:     Cervical: No cervical adenopathy.  Skin:    General: Skin is warm and dry.     Findings: No erythema or rash.  Neurological:     Mental Status: She is alert and oriented to person, place, and time.  Psychiatric:        Behavior: Behavior normal.        Thought Content: Thought content normal.        Judgment: Judgment normal.      Lab Results  Component Value Date   WBC 6.2 07/26/2018   HGB 9.9 (L) 07/26/2018   HCT 32.7 (L) 07/26/2018   MCV 112.4 (H) 07/26/2018   PLT 478 (H) 07/26/2018   Lab Results  Component Value Date   FERRITIN 1,286 (H) 07/05/2018   IRON 120 07/05/2018   TIBC 229 (L) 07/05/2018   UIBC 108 (L) 07/05/2018   IRONPCTSAT 53 07/05/2018   Lab Results  Component Value Date   RETICCTPCT 2.0 07/26/2018   RBC 2.91 (L) 07/26/2018   RBC 2.91 (L) 07/26/2018   RETICCTABS 45.8 06/02/2015   No results found for: KPAFRELGTCHN, LAMBDASER, KAPLAMBRATIO No results found for: IGGSERUM, IGA, IGMSERUM No results found  for: Odetta Pink, SPEI   Chemistry      Component Value Date/Time   NA 139 07/26/2018 0831   NA 143 07/16/2017 1057   NA 140 11/29/2016 0936   K 3.9 07/26/2018 0831   K 3.2 (L) 07/16/2017 1057   K 4.2 11/29/2016 0936   CL 103 07/26/2018 0831   CL 104 07/16/2017 1057   CO2 31 07/26/2018 0831   CO2 25 07/16/2017 1057   CO2 27 11/29/2016 0936   BUN 11 07/26/2018 0831   BUN 10 07/16/2017 1057   BUN 18.3 11/29/2016 0936   CREATININE 0.93 07/26/2018 0831   CREATININE 0.8 07/16/2017 1057   CREATININE 0.8 11/29/2016 0936      Component Value Date/Time   CALCIUM 8.5 (L) 07/26/2018 0831   CALCIUM 8.9 07/16/2017 1057   CALCIUM 8.9 11/29/2016 0936   ALKPHOS 70 07/26/2018 0831   ALKPHOS 71 07/16/2017 1057   ALKPHOS 77 11/29/2016 0936   AST 25 07/26/2018 0831   AST 19 11/29/2016 0936   ALT 16 07/26/2018 0831   ALT 27 07/16/2017 1057   ALT 10 11/29/2016 0936   BILITOT 0.2 (L) 07/26/2018 0831   BILITOT <0.22 11/29/2016 0936      Impression and Plan: Ms. Peggs is a very pleasant 67 yo caucasian female with essential thrombocythemia.   I am so happy to see that her platelet count is responding.  Hopefully, we will see the platelets continue to improve or at least stabilize.  We will hold off on sending her to [place] right now.  I want to see her back in 3 weeks.  If her platelet count continues to drop, then we will try to decrease the anagrelide dose.    I spent a good 45 minutes with her.  All the  time spent face to face.    [name], MD 12/27/20199:17 AM

## 2018-07-30 ENCOUNTER — Encounter: Payer: Self-pay | Admitting: Family Medicine

## 2018-07-30 ENCOUNTER — Ambulatory Visit (INDEPENDENT_AMBULATORY_CARE_PROVIDER_SITE_OTHER): Payer: Medicare Other | Admitting: Family Medicine

## 2018-07-30 DIAGNOSIS — R739 Hyperglycemia, unspecified: Secondary | ICD-10-CM

## 2018-07-30 DIAGNOSIS — G8929 Other chronic pain: Secondary | ICD-10-CM | POA: Diagnosis not present

## 2018-07-30 DIAGNOSIS — E039 Hypothyroidism, unspecified: Secondary | ICD-10-CM

## 2018-07-30 DIAGNOSIS — E559 Vitamin D deficiency, unspecified: Secondary | ICD-10-CM

## 2018-07-30 DIAGNOSIS — D473 Essential (hemorrhagic) thrombocythemia: Secondary | ICD-10-CM

## 2018-07-30 DIAGNOSIS — M549 Dorsalgia, unspecified: Secondary | ICD-10-CM | POA: Diagnosis not present

## 2018-07-30 DIAGNOSIS — J019 Acute sinusitis, unspecified: Secondary | ICD-10-CM

## 2018-07-30 DIAGNOSIS — E785 Hyperlipidemia, unspecified: Secondary | ICD-10-CM | POA: Diagnosis not present

## 2018-07-30 MED ORDER — CEFDINIR 300 MG PO CAPS: 300.0000 mg | ORAL_CAPSULE | Freq: Two times a day (BID) | ORAL | 0 refills | Status: AC

## 2018-07-30 MED ORDER — HYDROCODONE-HOMATROPINE 5-1.5 MG/5ML PO SYRP: 5.0000 mL | ORAL_SOLUTION | Freq: Three times a day (TID) | ORAL | 0 refills | Status: DC | PRN

## 2018-07-30 NOTE — Assessment & Plan Note (Signed)
hgba1c acceptable, minimize simple carbs. Increase exercise as tolerated.  

## 2018-07-30 NOTE — Assessment & Plan Note (Signed)
Supplement and monitor 

## 2018-07-30 NOTE — Assessment & Plan Note (Signed)
Encouraged heart healthy diet, increase exercise, avoid trans fats, consider a krill oil cap daily 

## 2018-07-30 NOTE — Patient Instructions (Signed)
Encouraged increased rest and hydration, add probiotics, zinc such as Coldeze or Xicam. Treat fevers as needed, elderberry, vitamin C 500 to 1000 mg and Mucinex twice daily Sinusitis, Adult Sinusitis is inflammation of your sinuses. Sinuses are hollow spaces in the bones around your face. Your sinuses are located:  Around your eyes.  In the middle of your forehead.  Behind your nose.  In your cheekbones. Mucus normally drains out of your sinuses. When your nasal tissues become inflamed or swollen, mucus can become trapped or blocked. This allows bacteria, viruses, and fungi to grow, which leads to infection. Most infections of the sinuses are caused by a virus. Sinusitis can develop quickly. It can last for up to 4 weeks (acute) or for more than 12 weeks (chronic). Sinusitis often develops after a cold. What are the causes? This condition is caused by anything that creates swelling in the sinuses or stops mucus from draining. This includes:  Allergies.  Asthma.  Infection from bacteria or viruses.  Deformities or blockages in your nose or sinuses.  Abnormal growths in the nose (nasal polyps).  Pollutants, such as chemicals or irritants in the air.  Infection from fungi (rare). What increases the risk? You are more likely to develop this condition if you:  Have a weak body defense system (immune system).  Do a lot of swimming or diving.  Overuse nasal sprays.  Smoke. What are the signs or symptoms? The main symptoms of this condition are pain and a feeling of pressure around the affected sinuses. Other symptoms include:  Stuffy nose or congestion.  Thick drainage from your nose.  Swelling and warmth over the affected sinuses.  Headache.  Upper toothache.  A cough that may get worse at night.  Extra mucus that collects in the throat or the back of the nose (postnasal drip).  Decreased sense of smell and taste.  Fatigue.  A fever.  Sore throat.  Bad  breath. How is this diagnosed? This condition is diagnosed based on:  Your symptoms.  Your medical history.  A physical exam.  Tests to find out if your condition is acute or chronic. This may include: ? Checking your nose for nasal polyps. ? Viewing your sinuses using a device that has a light (endoscope). ? Testing for allergies or bacteria. ? Imaging tests, such as an MRI or CT scan. In rare cases, a bone biopsy may be done to rule out more serious types of fungal sinus disease. How is this treated? Treatment for sinusitis depends on the cause and whether your condition is chronic or acute.  If caused by a virus, your symptoms should go away on their own within 10 days. You may be given medicines to relieve symptoms. They include: ? Medicines that shrink swollen nasal passages (topical intranasal decongestants). ? Medicines that treat allergies (antihistamines). ? A spray that eases inflammation of the nostrils (topical intranasal corticosteroids). ? Rinses that help get rid of thick mucus in your nose (nasal saline washes).  If caused by bacteria, your health care provider may recommend waiting to see if your symptoms improve. Most bacterial infections will get better without antibiotic medicine. You may be given antibiotics if you have: ? A severe infection. ? A weak immune system.  If caused by narrow nasal passages or nasal polyps, you may need to have surgery. Follow these instructions at home: Medicines  Take, use, or apply over-the-counter and prescription medicines only as told by your health care provider. These may include nasal  sprays.  If you were prescribed an antibiotic medicine, take it as told by your health care provider. Do not stop taking the antibiotic even if you start to feel better. Hydrate and humidify   Drink enough fluid to keep your urine pale yellow. Staying hydrated will help to thin your mucus.  Use a cool mist humidifier to keep the humidity  level in your home above 50%.  Inhale steam for 10-15 minutes, 3-4 times a day, or as told by your health care provider. You can do this in the bathroom while a hot shower is running.  Limit your exposure to cool or dry air. Rest  Rest as much as possible.  Sleep with your head raised (elevated).  Make sure you get enough sleep each night. General instructions   Apply a warm, moist washcloth to your face 3-4 times a day or as told by your health care provider. This will help with discomfort.  Wash your hands often with soap and water to reduce your exposure to germs. If soap and water are not available, use hand sanitizer.  Do not smoke. Avoid being around people who are smoking (secondhand smoke).  Keep all follow-up visits as told by your health care provider. This is important. Contact a health care provider if:  You have a fever.  Your symptoms get worse.  Your symptoms do not improve within 10 days. Get help right away if:  You have a severe headache.  You have persistent vomiting.  You have severe pain or swelling around your face or eyes.  You have vision problems.  You develop confusion.  Your neck is stiff.  You have trouble breathing. Summary  Sinusitis is soreness and inflammation of your sinuses. Sinuses are hollow spaces in the bones around your face.  This condition is caused by nasal tissues that become inflamed or swollen. The swelling traps or blocks the flow of mucus. This allows bacteria, viruses, and fungi to grow, which leads to infection.  If you were prescribed an antibiotic medicine, take it as told by your health care provider. Do not stop taking the antibiotic even if you start to feel better.  Keep all follow-up visits as told by your health care provider. This is important. This information is not intended to replace advice given to you by your health care provider. Make sure you discuss any questions you have with your health care  provider. Document Released: 07/17/2005 Document Revised: 12/17/2017 Document Reviewed: 12/17/2017 Elsevier Interactive Patient Education  2019 [employer].

## 2018-07-30 NOTE — Assessment & Plan Note (Signed)
Continues to work with hematology but they have not been able to diagnose the actual cause. They are considering a referral to [facility] if her new treatment does not help her numbers. She notes some nausea with the new treatment

## 2018-07-31 ENCOUNTER — Encounter: Payer: Self-pay | Admitting: Family Medicine

## 2018-08-01 ENCOUNTER — Encounter: Payer: Self-pay | Admitting: Family Medicine

## 2018-08-01 NOTE — Assessment & Plan Note (Signed)
Encouraged increased rest and hydration, add probiotics, zinc such as Coldeze or Xicam. Treat fevers as needed. Start on antibiotics and increased rest and hydration. Return if no improvement

## 2018-08-01 NOTE — Assessment & Plan Note (Signed)
On Levothyroxine, continue to monitor 

## 2018-08-01 NOTE — Assessment & Plan Note (Signed)
Encouraged moist heat and gentle stretching as tolerated. May try NSAIDs and prescription meds as directed and report if symptoms worsen or seek immediate care. UDS and contract are UTD. May continue current meds

## 2018-08-01 NOTE — Progress Notes (Signed)
Subjective:    Patient ID: Rachael Johnson, female    DOB: [birth year], 68 y.o.   MRN: [id number]  No chief complaint on file.   HPI Patient is in today for follow up. No recent febrile illness or hospitalizations but she feels badly today. She is noting increased congestion, headache, malaise, chills, cough, anorexia and nausea. She continues to struggle with ongoing back pain and right shoulder pain. Denies CP/palp/SOB or GU c/o. Taking meds as prescribed  Past Medical History:  Diagnosis Date  . Acute renal insufficiency 04/09/2017  . Anemia, iron deficiency 07/08/2012  . Arthritis   . Cataract 12/21/2014  . Depression with anxiety 03/24/2011  . Diverticulitis   . Esophageal reflux 08/17/2013  . Essential thrombocythemia ([place]) 01/24/2011  . Frequent episodic tension-type headache   . Gastroenteritis 12/21/2014  . 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 GERD (gastroesophageal reflux disease)   . H. pylori infection 12/19/2012  . Hyperglycemia 12/17/2015  . Hypertension   . Iron deficiency anemia due to chronic blood loss 04/03/2017  . Osteoarthritis 05/24/2017  . Other and unspecified hyperlipidemia 02/25/2013  . Restless leg syndrome 09/30/2014  . Rhabdomyolysis 02/2017  . Scabies 03/28/2015  . 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 Seizure ([place])    childhood  . Shoulder wound, right, sequela 03/14/2017  . Thyroid disease     Past Surgical History:  Procedure Laterality Date  . ABDOMINAL HYSTERECTOMY     1992  . BREAST CYST ASPIRATION    . BREAST SURGERY  1992   biopsy, benign. Fibrocystic  . UMBILICAL HERNIA REPAIR N/A 09/25/2012   Procedure: HERNIA REPAIR UMBILICAL ADULT;  Surgeon: Harl Bowie, MD;  Location: [facility];  Service: General;  Laterality: N/A;    Family History  Problem Relation Age of Onset  . Arthritis Mother   . Cancer Mother        ovarian  . Hypertension Mother   . Heart disease Mother        pacer  . Heart failure Mother   . Arthritis Father   . Cancer  Father        lung  . Hypertension Father   . Cancer Sister        lung  . Cancer Brother        prostate  . Cancer Brother        lung  . Hypertension Son   . COPD Brother   . Heart disease Brother        died from CHF  . Hypertension Brother   . Cancer Brother        colon  . Alcohol abuse Brother   . Cirrhosis Brother   . Seizures Sister   . Stroke Sister   . Arthritis Brother     Social History   Socioeconomic History  . Marital status: Widowed    Spouse name: Not on file  . Number of children: 2  . Years of education: Not on file  . Highest education level: Not on file  Occupational History  . Occupation: APL IT sales professional: [employer]    Comment: [employer] [place]  . Financial resource strain: Not on file  . Food insecurity:    Worry: Not on file    Inability: Not on file  . Transportation needs:    Medical: Not on file    Non-medical: Not on file  Tobacco Use  . Smoking status: Former Smoker    Packs/day: 3.50  Years: 20.00    Pack years: 70.00    Types: Cigarettes    Start date: 11/15/1970    Last attempt to quit: 07/31/1990    Years since quitting: 28.0  . Smokeless tobacco: Never Used  . Tobacco comment: quit 23 years ago  Substance and Sexual Activity  . Alcohol use: No    Alcohol/week: 0.0 standard drinks  . Drug use: No  . Sexual activity: Never  Lifestyle  . Physical activity:    Days per week: Not on file    Minutes per session: Not on file  . Stress: Not on file  Relationships  . Social connections:    Talks on phone: Not on file    Gets together: Not on file    Attends religious service: Not on file    Active member of club or organization: Not on file    Attends meetings of clubs or organizations: Not on file    Relationship status: Not on file  . Intimate partner violence:    Fear of current or ex partner: Not on file    Emotionally abused: Not on file    Physically abused: Not on file    Forced sexual activity:  Not on file  Other Topics Concern  . Not on file  Social History Narrative   Regular exercise: no   Caffeine Use: 2-3 weekly    Outpatient Medications Prior to Visit  Medication Sig Dispense Refill  . albuterol (VENTOLIN HFA) 108 (90 Base) MCG/ACT inhaler Inhale 2 puffs into the lungs every 6 (six) hours as needed for wheezing or shortness of breath. 54 Inhaler 1  . ALPRAZolam (XANAX) 1 MG tablet TAKE 1/2 TO 1 (ONE-HALF TO ONE) TABLET BY MOUTH THREE TIMES DAILY AS NEEDED FOR ANXIETY 90 tablet 1  . anagrelide (AGRYLIN) 1 MG capsule Take 3 capsules (3 mg total) by mouth 2 (two) times daily. 180 capsule 3  . aspirin EC 81 MG tablet Take 81 mg by mouth every morning.    Marland Kitchen atorvastatin (LIPITOR) 10 MG tablet Take 1 tablet (10 mg total) by mouth daily. 90 tablet 3  . budesonide-formoterol (SYMBICORT) 80-4.5 MCG/ACT inhaler Inhale 2 puffs into the lungs 2 (two) times daily. 10.2 g 5  . busPIRone (BUSPAR) 10 MG tablet Take 1 tablet (10 mg total) by mouth 3 (three) times daily. 90 tablet 1  . cetirizine (ZYRTEC) 10 MG tablet Take 1 tablet (10 mg total) by mouth daily. 90 tablet 1  . chlorpheniramine (CHLOR-TRIMETON) 4 MG tablet Take 8 mg by mouth at bedtime.    . Cranberry 300 MG tablet Take 300 mg by mouth 2 (two) times daily.    . cyclobenzaprine (FLEXERIL) 10 MG tablet Take 1 tablet (10 mg total) by mouth 2 (two) times daily as needed for muscle spasms. 60 tablet 0  . estradiol (ESTRACE) 0.5 MG tablet Take 1 tablet (0.5 mg total) by mouth 2 (two) times daily. 180 tablet 1  . famotidine (PEPCID) 40 MG tablet Take 1 tablet (40 mg total) by mouth daily. 90 tablet 1  . fluconazole (DIFLUCAN) 150 MG tablet Take 1 tablet (150 mg total) by mouth once a week. 2 tablet 1  . fluticasone (FLONASE) 50 MCG/ACT nasal spray Place 2 sprays into both nostrils daily. 48 g 3  . furosemide (LASIX) 20 MG tablet Take 1 tablet (20 mg total) by mouth 3 (three) times daily as needed for fluid or edema. 90 tablet 2  .  gabapentin (NEURONTIN) 300 MG  capsule Take 1 capsule (300 mg total) by mouth at bedtime. 30 capsule 1  . levothyroxine (SYNTHROID, LEVOTHROID) 100 MCG tablet Take 1 tablet (100 mcg total) by mouth daily before breakfast. 30 tablet 3  . magic mouthwash SOLN Take 5 mLs by mouth 3 (three) times daily as needed for mouth pain. Components benadryl  525 mg, hydrocortisone 60 mg and nystatin 0.6 mg. 240 ml - Oral 240 mL 3  . meclizine (ANTIVERT) 12.5 MG tablet Take 1 tablet (12.5 mg total) by mouth 3 (three) times daily as needed for dizziness. 40 tablet 2  . montelukast (SINGULAIR) 10 MG tablet Take 1 tablet (10 mg total) by mouth at bedtime. 90 tablet 2  . Multiple Vitamin (MULTIVITAMIN PO) Take 1 tablet by mouth every morning.     . nystatin (MYCOSTATIN) 100000 UNIT/ML suspension     . ondansetron (ZOFRAN) 8 MG tablet TAKE 1/2 TO 1 TABLET BY MOUTH EVERY 8 HOURS AS NEEDED FOR NAUSEA AND VOMITING 90 tablet 1  . oxyCODONE-acetaminophen (PERCOCET) 10-325 MG tablet Take 1 tablet by mouth every 6 (six) hours as needed for pain. 120 tablet 0  . RESTASIS 0.05 % ophthalmic emulsion PLACE ONE DROP INTO EACH EYE EVERY 12 HOURS  3  . ruxolitinib phosphate (JAKAFI) 20 MG tablet Take 1 tablet (20 mg total) by mouth 2 (two) times daily. 60 tablet 4  . silver sulfADIAZINE (SILVADENE) 1 % cream Apply 1 application topically daily. 50 g 1  . venlafaxine XR (EFFEXOR-XR) 150 MG 24 hr capsule Take 1 capsule (150 mg total) by mouth daily with breakfast. 90 capsule 1   No facility-administered medications prior to visit.     No Known Allergies  Review of Systems  Constitutional: Positive for malaise/fatigue. Negative for fever.  HENT: Positive for congestion and sinus pain.   Eyes: Negative for blurred vision.  Respiratory: Positive for cough. Negative for shortness of breath.   Cardiovascular: Negative for chest pain, palpitations and leg swelling.  Gastrointestinal: Negative for abdominal pain, blood in stool and  nausea.  Genitourinary: Negative for dysuria and frequency.  Musculoskeletal: Positive for back pain, joint pain and myalgias. Negative for falls.  Skin: Negative for rash.  Neurological: Negative for dizziness, loss of consciousness and headaches.  Endo/Heme/Allergies: Negative for environmental allergies.  Psychiatric/Behavioral: Negative for depression. The patient is not nervous/anxious.        Objective:    Physical Exam Vitals signs and nursing note reviewed.  Constitutional:      General: She is not in acute distress.    Appearance: She is well-developed.  HENT:     Head: Normocephalic and atraumatic.     Right Ear: Tympanic membrane normal.     Left Ear: Tympanic membrane normal.     Nose: Nose normal.     Comments: Nasal mucosa boggy and erythematous Eyes:     General:        Right eye: No discharge.        Left eye: No discharge.  Neck:     Musculoskeletal: Normal range of motion and neck supple.  Cardiovascular:     Rate and Rhythm: Normal rate and regular rhythm.     Heart sounds: No murmur.  Pulmonary:     Effort: Pulmonary effort is normal.     Breath sounds: Normal breath sounds.  Abdominal:     General: Bowel sounds are normal.     Palpations: Abdomen is soft.     Tenderness: There is no abdominal tenderness.  Skin:    General: Skin is warm and dry.  Neurological:     Mental Status: She is alert and oriented to person, place, and time.     BP (!) 142/82 (BP Location: Left Arm, Patient Position: Sitting, Cuff Size: Normal)   Pulse 82   Temp 98.3 F (36.8 C) (Oral)   Resp 18   Wt 162 lb (73.5 kg)   LMP 07/31/1990   SpO2 95%   BMI 28.70 kg/m  Wt Readings from Last 3 Encounters:  07/30/18 162 lb (73.5 kg)  07/26/18 162 lb (73.5 kg)  07/05/18 152 lb 1.9 oz (69 kg)     Lab Results  Component Value Date   WBC 6.2 07/26/2018   HGB 9.9 (L) 07/26/2018   HCT 32.7 (L) 07/26/2018   PLT 478 (H) 07/26/2018   GLUCOSE 92 07/26/2018   CHOL 236 (H)  05/06/2018   TRIG 322.0 (H) 05/06/2018   HDL 59.00 05/06/2018   LDLDIRECT 95.0 05/06/2018   LDLCALC 82 02/12/2017   ALT 16 07/26/2018   AST 25 07/26/2018   NA 139 07/26/2018   K 3.9 07/26/2018   CL 103 07/26/2018   CREATININE 0.93 07/26/2018   BUN 11 07/26/2018   CO2 31 07/26/2018   TSH 0.02 (L) 05/06/2018   INR 0.95 11/16/2017   HGBA1C 5.0 08/10/2017    Lab Results  Component Value Date   TSH 0.02 (L) 05/06/2018   Lab Results  Component Value Date   WBC 6.2 07/26/2018   HGB 9.9 (L) 07/26/2018   HCT 32.7 (L) 07/26/2018   MCV 112.4 (H) 07/26/2018   PLT 478 (H) 07/26/2018   Lab Results  Component Value Date   NA 139 07/26/2018   K 3.9 07/26/2018   CHLORIDE 105 11/29/2016   CO2 31 07/26/2018   GLUCOSE 92 07/26/2018   BUN 11 07/26/2018   CREATININE 0.93 07/26/2018   BILITOT 0.2 (L) 07/26/2018   ALKPHOS 70 07/26/2018   AST 25 07/26/2018   ALT 16 07/26/2018   PROT 7.0 07/26/2018   ALBUMIN 3.9 07/26/2018   CALCIUM 8.5 (L) 07/26/2018   ANIONGAP 5 07/26/2018   EGFR 80 (L) 11/29/2016   GFR 62.50 05/06/2018   Lab Results  Component Value Date   CHOL 236 (H) 05/06/2018   Lab Results  Component Value Date   HDL 59.00 05/06/2018   Lab Results  Component Value Date   LDLCALC 82 02/12/2017   Lab Results  Component Value Date   TRIG 322.0 (H) 05/06/2018   Lab Results  Component Value Date   CHOLHDL 4 05/06/2018   Lab Results  Component Value Date   HGBA1C 5.0 08/10/2017       Assessment & Plan:   Problem List Items Addressed This Visit    Hypothyroid    On Levothyroxine, continue to monitor      Hyperlipidemia    Encouraged heart healthy diet, increase exercise, avoid trans fats, consider a krill oil cap daily      Essential thrombocythemia ([place])    Continues to work with hematology but they have not been able to diagnose the actual cause. [facility] are [facility] a referral [facility] if her new treatment does not help her numbers. She notes some  nausea with the new treatment      Relevant Medications   cefdinir (OMNICEF) 300 MG capsule   Hyperglycemia    hgba1c acceptable, minimize simple carbs. Increase exercise as tolerated.       Vitamin D  deficiency    Supplement and monitor      Chronic back pain    Encouraged moist heat and gentle stretching as tolerated. May try NSAIDs and prescription meds as directed and report if symptoms worsen or seek immediate care. UDS and contract are UTD. May continue current meds      Sinusitis    Encouraged increased rest and hydration, add probiotics, zinc such as Coldeze or Xicam. Treat fevers as needed. Start on antibiotics and increased rest and hydration. Return if no improvement      Relevant Medications   cefdinir (OMNICEF) 300 MG capsule   HYDROcodone-homatropine (HYCODAN) 5-1.5 MG/5ML syrup      I am having Rachael Johnson start on cefdinir and HYDROcodone-homatropine. I am also having her maintain her Multiple Vitamin (MULTIVITAMIN PO), aspirin EC, Cranberry, chlorpheniramine, RESTASIS, budesonide-formoterol, albuterol, magic mouthwash, nystatin, meclizine, silver sulfADIAZINE, ruxolitinib phosphate, ondansetron, furosemide, atorvastatin, levothyroxine, ALPRAZolam, cyclobenzaprine, gabapentin, busPIRone, cetirizine, estradiol, fluticasone, montelukast, famotidine, venlafaxine XR, fluconazole, anagrelide, and oxyCODONE-acetaminophen.  Meds ordered this encounter  Medications  . cefdinir (OMNICEF) 300 MG capsule    Sig: Take 1 capsule (300 mg total) by mouth 2 (two) times daily for 10 days.    Dispense:  20 capsule    Refill:  0  . HYDROcodone-homatropine (HYCODAN) 5-1.5 MG/5ML syrup    Sig: Take 5 mLs by mouth every 8 (eight) hours as needed for cough.    Dispense:  120 mL    Refill:  0     Penni Homans, MD

## 2018-08-08 ENCOUNTER — Other Ambulatory Visit: Payer: Self-pay | Admitting: Family Medicine

## 2018-08-08 ENCOUNTER — Encounter: Payer: Self-pay | Admitting: Family Medicine

## 2018-08-08 DIAGNOSIS — G8929 Other chronic pain: Secondary | ICD-10-CM

## 2018-08-08 DIAGNOSIS — R300 Dysuria: Secondary | ICD-10-CM

## 2018-08-08 DIAGNOSIS — I10 Essential (primary) hypertension: Secondary | ICD-10-CM

## 2018-08-08 DIAGNOSIS — M549 Dorsalgia, unspecified: Principal | ICD-10-CM

## 2018-08-08 DIAGNOSIS — R739 Hyperglycemia, unspecified: Secondary | ICD-10-CM

## 2018-08-08 DIAGNOSIS — E559 Vitamin D deficiency, unspecified: Secondary | ICD-10-CM

## 2018-08-08 MED ORDER — OXYCODONE-ACETAMINOPHEN 10-325 MG PO TABS: 1.0000 | ORAL_TABLET | Freq: Four times a day (QID) | ORAL | 0 refills | Status: DC | PRN

## 2018-08-09 ENCOUNTER — Encounter: Payer: Self-pay | Admitting: Pharmacy Technician

## 2018-08-13 ENCOUNTER — Other Ambulatory Visit: Payer: Self-pay | Admitting: Family Medicine

## 2018-08-13 DIAGNOSIS — M549 Dorsalgia, unspecified: Principal | ICD-10-CM

## 2018-08-13 DIAGNOSIS — G8929 Other chronic pain: Secondary | ICD-10-CM

## 2018-08-16 ENCOUNTER — Encounter: Payer: Self-pay | Admitting: Hematology & Oncology

## 2018-08-16 ENCOUNTER — Inpatient Hospital Stay: Payer: Medicare Other

## 2018-08-16 ENCOUNTER — Inpatient Hospital Stay: Payer: Medicare Other | Attending: Hematology & Oncology | Admitting: Hematology & Oncology

## 2018-08-16 ENCOUNTER — Other Ambulatory Visit: Payer: Self-pay

## 2018-08-16 VITALS — BP 121/66 | HR 91 | Resp 18

## 2018-08-16 VITALS — BP 112/62 | HR 102 | Temp 98.2°F | Resp 20 | Wt 162.1 lb

## 2018-08-16 DIAGNOSIS — Z7982 Long term (current) use of aspirin: Secondary | ICD-10-CM

## 2018-08-16 DIAGNOSIS — D539 Nutritional anemia, unspecified: Secondary | ICD-10-CM

## 2018-08-16 DIAGNOSIS — Z79899 Other long term (current) drug therapy: Secondary | ICD-10-CM | POA: Diagnosis not present

## 2018-08-16 DIAGNOSIS — D473 Essential (hemorrhagic) thrombocythemia: Secondary | ICD-10-CM | POA: Diagnosis not present

## 2018-08-16 DIAGNOSIS — M25511 Pain in right shoulder: Secondary | ICD-10-CM

## 2018-08-16 DIAGNOSIS — M7989 Other specified soft tissue disorders: Secondary | ICD-10-CM | POA: Diagnosis not present

## 2018-08-16 DIAGNOSIS — D500 Iron deficiency anemia secondary to blood loss (chronic): Secondary | ICD-10-CM

## 2018-08-16 DIAGNOSIS — D509 Iron deficiency anemia, unspecified: Secondary | ICD-10-CM

## 2018-08-16 DIAGNOSIS — D7581 Myelofibrosis: Secondary | ICD-10-CM | POA: Diagnosis not present

## 2018-08-16 LAB — CMP (CANCER CENTER ONLY)
ALT: 12 U/L (ref 0–44)
AST: 23 U/L (ref 15–41)
Albumin: 4.4 g/dL (ref 3.5–5.0)
Alkaline Phosphatase: 69 U/L (ref 38–126)
Anion gap: 7 (ref 5–15)
BUN: 17 mg/dL (ref 8–23)
CO2: 32 mmol/L (ref 22–32)
Calcium: 9.8 mg/dL (ref 8.9–10.3)
Chloride: 103 mmol/L (ref 98–111)
Creatinine: 1.01 mg/dL — ABNORMAL HIGH (ref 0.44–1.00)
GFR, Est AFR Am: >60 mL/min (ref >60)
GFR, Estimated: 58 mL/min — ABNORMAL LOW (ref >60)
Glucose, Bld: 113 mg/dL — ABNORMAL HIGH (ref 70–99)
Potassium: 3.8 mmol/L (ref 3.5–5.1)
Sodium: 142 mmol/L (ref 135–145)
Total Bilirubin: 0.3 mg/dL (ref 0.3–1.2)
Total Protein: 6.9 g/dL (ref 6.5–8.1)

## 2018-08-16 LAB — CBC WITH DIFFERENTIAL (CANCER CENTER ONLY)
Abs Immature Granulocytes: 0.02 10*3/uL (ref 0.00–0.07)
Basophils Absolute: 0 10*3/uL (ref 0.0–0.1)
Basophils Relative: 1 %
Eosinophils Absolute: 0.1 10*3/uL (ref 0.0–0.5)
Eosinophils Relative: 3 %
HCT: 30.8 % — ABNORMAL LOW (ref 36.0–46.0)
Hemoglobin: 9.7 g/dL — ABNORMAL LOW (ref 12.0–15.0)
Immature Granulocytes: 1 %
Lymphocytes Relative: 27 %
Lymphs Abs: 1.2 10*3/uL (ref 0.7–4.0)
MCH: 34 pg (ref 26.0–34.0)
MCHC: 31.5 g/dL (ref 30.0–36.0)
MCV: 108.1 fL — ABNORMAL HIGH (ref 80.0–100.0)
Monocytes Absolute: 0.4 10*3/uL (ref 0.1–1.0)
Monocytes Relative: 10 %
Neutro Abs: 2.5 10*3/uL (ref 1.7–7.7)
Neutrophils Relative %: 58 %
Platelet Count: 521 10*3/uL — ABNORMAL HIGH (ref 150–400)
RBC: 2.85 MIL/uL — ABNORMAL LOW (ref 3.87–5.11)
RDW: 13.2 % (ref 11.5–15.5)
WBC Count: 4.3 10*3/uL (ref 4.0–10.5)
nRBC: 0 % (ref 0.0–0.2)

## 2018-08-16 LAB — IRON AND TIBC
Iron: 114 ug/dL (ref 41–142)
Saturation Ratios: 51 % (ref 21–57)
TIBC: 221 ug/dL — ABNORMAL LOW (ref 236–444)
UIBC: 108 ug/dL — ABNORMAL LOW (ref 120–384)

## 2018-08-16 LAB — LACTATE DEHYDROGENASE: LDH: 268 U/L — ABNORMAL HIGH (ref 98–192)

## 2018-08-16 LAB — RETICULOCYTES
Immature Retic Fract: 15.8 % (ref 2.3–15.9)
RBC.: 2.85 MIL/uL — ABNORMAL LOW (ref 3.87–5.11)
Retic Count, Absolute: 41.3 10*3/uL (ref 19.0–186.0)
Retic Ct Pct: 1.5 % (ref 0.4–3.1)

## 2018-08-16 LAB — SAMPLE TO BLOOD BANK

## 2018-08-16 LAB — FERRITIN: Ferritin: 839 ng/mL — ABNORMAL HIGH (ref 11–307)

## 2018-08-16 MED ORDER — SODIUM CHLORIDE 0.9 % IV SOLN
510.0000 mg | Freq: Once | INTRAVENOUS | Status: AC
  Administered 2018-08-16: 510 mg via INTRAVENOUS
  Filled 2018-08-16: qty 17

## 2018-08-16 MED ORDER — SODIUM CHLORIDE 0.9 % IV SOLN
INTRAVENOUS | Status: DC
  Administered 2018-08-16: 12:00:00 via INTRAVENOUS
  Filled 2018-08-16: qty 250

## 2018-08-16 NOTE — Patient Instructions (Signed)

## 2018-08-16 NOTE — Patient Instructions (Signed)

## 2018-08-16 NOTE — Progress Notes (Signed)
Hematology and Oncology Follow Up Visit  Rachael Johnson [id number] March 06, 1951 68 y.o. 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  She feels quite tired.  When we last saw her back in December, her ferritin was 861.  Her serum iron was only 50.  Her iron saturation was only 24%.  I do believe that she is iron deficient.  We will see about giving her a dose of IV iron today and maybe that will make her feel a little bit better.  She is doing okay with the anagrelide.  She really has had no problems with the anagrelide or with the [facility].  There is been no change in bowel or bladder habits.  She has had no diarrhea.  She has had no rashes.  There is been some leg swelling.  I think the leg swelling probably is from her anemia.  She is sleeping okay.  Her right shoulder is doing little bit better.  It is taking quite a while for that wound to close in.  Overall, her performance status is ECOG 1.      Medications:  Allergies as of 08/16/2018   No Known Allergies     Medication List       Accurate as of August 16, 2018 11:26 AM. Always use your most recent med list.        albuterol 108 (90 Base) MCG/ACT inhaler Commonly known as:  VENTOLIN HFA Inhale 2 puffs into the lungs every 6 (six) hours as needed for wheezing or shortness of breath.   ALPRAZolam 1 MG tablet Commonly known as:  XANAX TAKE 1/2 TO 1 (ONE-HALF TO ONE) TABLET BY MOUTH THREE TIMES DAILY AS NEEDED FOR ANXIETY   anagrelide 1 MG capsule Commonly known as:  AGRYLIN Take 3 capsules  (3 mg total) by mouth 2 (two) times daily.   aspirin EC 81 MG tablet Take 81 mg by mouth every morning.   atorvastatin 10 MG tablet Commonly known as:  LIPITOR Take 1 tablet (10 mg total) by mouth daily.   budesonide-formoterol 80-4.5 MCG/ACT inhaler Commonly known as:  SYMBICORT Inhale 2 puffs into the lungs 2 (two) times daily.   busPIRone 10 MG tablet Commonly known as:  BUSPAR Take 1 tablet (10 mg total) by mouth 3 (three) times daily.   cetirizine 10 MG tablet Commonly known as:  ZYRTEC Take 1 tablet (10 mg total) by mouth daily.   chlorpheniramine 4 MG tablet Commonly known as:  CHLOR-TRIMETON Take 8 mg by mouth at bedtime.   Cranberry 300 MG tablet Take 300 mg by mouth 2 (two) times daily.   cyclobenzaprine 10 MG tablet Commonly known as:  FLEXERIL Take 1 tablet (10 mg total) by mouth 2 (two) times daily as needed for muscle spasms.   estradiol 0.5 MG tablet Commonly known as:  ESTRACE Take 1 tablet (0.5 mg total) by mouth 2 (two) times daily.   famotidine 40 MG tablet Commonly known as:  PEPCID Take 1 tablet (40 mg total) by mouth daily.   fluconazole 150 MG tablet  Commonly known as:  DIFLUCAN Take 1 tablet (150 mg total) by mouth once a week.   fluticasone 50 MCG/ACT nasal spray Commonly known as:  FLONASE Place 2 sprays into both nostrils daily.   furosemide 20 MG tablet Commonly known as:  LASIX Take 1 tablet (20 mg total) by mouth 3 (three) times daily as needed for fluid or edema.   gabapentin 300 MG capsule Commonly known as:  NEURONTIN TAKE 1 CAPSULE BY MOUTH AT BEDTIME   HYDROcodone-homatropine 5-1.5 MG/5ML syrup Commonly known as:  HYCODAN Take 5 mLs by mouth every 8 (eight) hours as needed for cough.   levothyroxine 100 MCG tablet Commonly known as:  SYNTHROID, LEVOTHROID Take 1 tablet (100 mcg total) by mouth daily before breakfast.   magic mouthwash Soln Take 5 mLs by mouth 3 (three) times daily as needed for mouth pain. Components  benadryl  525 mg, hydrocortisone 60 mg and nystatin 0.6 mg. 240 ml - Oral   meclizine 12.5 MG tablet Commonly known as:  ANTIVERT TAKE 1 TABLET BY MOUTH THREE TIMES DAILY AS NEEDED AS NEEDED FOR DIZZINESS   montelukast 10 MG tablet Commonly known as:  SINGULAIR Take 1 tablet (10 mg total) by mouth at bedtime.   MULTIVITAMIN PO Take 1 tablet by mouth every morning.   nystatin 100000 UNIT/ML suspension Commonly known as:  MYCOSTATIN   ondansetron 8 MG tablet Commonly known as:  ZOFRAN TAKE 1/2 TO 1 TABLET BY MOUTH EVERY 8 HOURS AS NEEDED FOR NAUSEA AND VOMITING   oxyCODONE-acetaminophen 10-325 MG tablet Commonly known as:  PERCOCET Take 1 tablet by mouth every 6 (six) hours as needed for pain.   RESTASIS 0.05 % ophthalmic emulsion Generic drug:  cycloSPORINE PLACE ONE DROP INTO EACH EYE EVERY 12 HOURS   ruxolitinib phosphate 20 MG tablet Commonly known as:  JAKAFI Take 1 tablet (20 mg total) by mouth 2 (two) times daily.   silver sulfADIAZINE 1 % cream Commonly known as:  SILVADENE Apply 1 application topically daily.   venlafaxine XR 150 MG 24 hr capsule Commonly known as:  EFFEXOR-XR Take 1 capsule (150 mg total) by mouth daily with breakfast.       Allergies: No Known Allergies  Past Medical History, Surgical history, Social history, and Family History were reviewed and updated.  Review of Systems: Review of Systems  Constitutional: Positive for diaphoresis and malaise/fatigue.  HENT: Negative.   Eyes: Negative.   Respiratory: Positive for shortness of breath.   Cardiovascular: Negative.   Gastrointestinal: Positive for abdominal pain, heartburn and nausea.  Genitourinary: Negative.   Musculoskeletal: Positive for joint pain and myalgias.  Skin: Negative.   Neurological: Positive for dizziness.  Endo/Heme/Allergies: Negative.   Psychiatric/Behavioral: Negative.      Physical Exam:  weight is 162 lb 1.9 oz (73.5 kg). Her oral temperature is 98.2 F  (36.8 C). Her blood pressure is 112/62 and her pulse is 102 (abnormal). Her respiration is 20 and oxygen saturation is 98%.   Wt Readings from Last 3 Encounters:  08/16/18 162 lb 1.9 oz (73.5 kg)  07/30/18 162 lb (73.5 kg)  07/26/18 162 lb (73.5 kg)    Physical Exam Vitals signs reviewed.  HENT:     Head: Normocephalic and atraumatic.  Eyes:     Pupils: Pupils are equal, round, and reactive to light.  Neck:     Musculoskeletal: Normal range of motion.  Cardiovascular:     Rate and Rhythm: Normal rate and regular rhythm.  Heart sounds: Normal heart sounds.  Pulmonary:     Effort: Pulmonary effort is normal.     Breath sounds: Normal breath sounds.  Abdominal:     General: Bowel sounds are normal.     Palpations: Abdomen is soft.     Comments: Her spleen tip is palpable a couple centimeters below the left costal margin.  Musculoskeletal: Normal range of motion.        General: No tenderness or deformity.  Lymphadenopathy:     Cervical: No cervical adenopathy.  Skin:    General: Skin is warm and dry.     Findings: No erythema or rash.  Neurological:     Mental Status: She is alert and oriented to person, place, and time.  Psychiatric:        Behavior: Behavior normal.        Thought Content: Thought content normal.        Judgment: Judgment normal.      Lab Results  Component Value Date   WBC 4.3 08/16/2018   HGB 9.7 (L) 08/16/2018   HCT 30.8 (L) 08/16/2018   MCV 108.1 (H) 08/16/2018   PLT 521 (H) 08/16/2018   Lab Results  Component Value Date   FERRITIN 861 (H) 07/26/2018   IRON 50 07/26/2018   TIBC 206 (L) 07/26/2018   UIBC 157 07/26/2018   IRONPCTSAT 24 07/26/2018   Lab Results  Component Value Date   RETICCTPCT 1.5 08/16/2018   RBC 2.85 (L) 08/16/2018   RBC 2.85 (L) 08/16/2018   RETICCTABS 45.8 06/02/2015   No results found for: KPAFRELGTCHN, LAMBDASER, KAPLAMBRATIO No results found for: IGGSERUM, IGA, IGMSERUM No results found for:  Odetta Pink, SPEI   Chemistry      Component Value Date/Time   NA 142 08/16/2018 0933   NA 143 07/16/2017 1057   NA 140 11/29/2016 0936   K 3.8 08/16/2018 0933   K 3.2 (L) 07/16/2017 1057   K 4.2 11/29/2016 0936   CL 103 08/16/2018 0933   CL 104 07/16/2017 1057   CO2 32 08/16/2018 0933   CO2 25 07/16/2017 1057   CO2 27 11/29/2016 0936   BUN 17 08/16/2018 0933   BUN 10 07/16/2017 1057   BUN 18.3 11/29/2016 0936   CREATININE 1.01 (H) 08/16/2018 0933   CREATININE 0.8 07/16/2017 1057   CREATININE 0.8 11/29/2016 0936      Component Value Date/Time   CALCIUM 9.8 08/16/2018 0933   CALCIUM 8.9 07/16/2017 1057   CALCIUM 8.9 11/29/2016 0936   ALKPHOS 69 08/16/2018 0933   ALKPHOS 71 07/16/2017 1057   ALKPHOS 77 11/29/2016 0936   AST 23 08/16/2018 0933   AST 19 11/29/2016 0936   ALT 12 08/16/2018 0933   ALT 27 07/16/2017 1057   ALT 10 11/29/2016 0936   BILITOT 0.3 08/16/2018 0933   BILITOT <0.22 11/29/2016 0936      Impression and Plan: Ms. Ullman is a very pleasant 68 yo caucasian female with essential thrombocythemia.   We will see how she does with the IV iron today.  I will not make a change with her anagrelide dose.  Let us plan to get her back in about 3 to 4 weeks.  Maybe, we will see that she is responding to the iron and that her platelet count is improving as well as her hemoglobin.    [name], MD 1/17/202011:26 AM

## 2018-08-17 ENCOUNTER — Encounter: Payer: Self-pay | Admitting: Family Medicine

## 2018-08-17 ENCOUNTER — Encounter: Payer: Self-pay | Admitting: Hematology & Oncology

## 2018-08-19 ENCOUNTER — Other Ambulatory Visit: Payer: Self-pay | Admitting: Hematology & Oncology

## 2018-08-19 ENCOUNTER — Other Ambulatory Visit: Payer: Self-pay | Admitting: Family Medicine

## 2018-08-19 DIAGNOSIS — D473 Essential (hemorrhagic) thrombocythemia: Secondary | ICD-10-CM

## 2018-08-19 MED ORDER — AMOXICILLIN-POT CLAVULANATE 875-125 MG PO TABS: 1.0000 | ORAL_TABLET | Freq: Two times a day (BID) | ORAL | 0 refills | Status: DC

## 2018-08-20 MED FILL — JAKAFI 20 MG TABLET: 20 | 30 days supply | Qty: 60 | Fill #4

## 2018-08-20 MED FILL — ANAGRELIDE HCL 1 MG CAPS: 1 | 25 days supply | Qty: 150 | Fill #0

## 2018-08-25 ENCOUNTER — Encounter: Payer: Self-pay | Admitting: Family Medicine

## 2018-08-26 ENCOUNTER — Other Ambulatory Visit: Payer: Self-pay | Admitting: Family Medicine

## 2018-08-26 DIAGNOSIS — R300 Dysuria: Secondary | ICD-10-CM

## 2018-08-26 DIAGNOSIS — G8929 Other chronic pain: Secondary | ICD-10-CM

## 2018-08-26 DIAGNOSIS — I10 Essential (primary) hypertension: Secondary | ICD-10-CM

## 2018-08-26 DIAGNOSIS — M549 Dorsalgia, unspecified: Secondary | ICD-10-CM

## 2018-08-26 DIAGNOSIS — R739 Hyperglycemia, unspecified: Secondary | ICD-10-CM

## 2018-08-26 DIAGNOSIS — E559 Vitamin D deficiency, unspecified: Secondary | ICD-10-CM

## 2018-08-26 MED ORDER — FUROSEMIDE 20 MG PO TABS: 20.0000 mg | ORAL_TABLET | Freq: Three times a day (TID) | ORAL | 2 refills | Status: DC | PRN

## 2018-08-26 MED ORDER — ALPRAZOLAM 1 MG PO TABS: ORAL_TABLET | ORAL | 1 refills | Status: DC

## 2018-08-26 MED ORDER — CYCLOBENZAPRINE HCL 10 MG PO TABS: 10.0000 mg | ORAL_TABLET | Freq: Two times a day (BID) | ORAL | 0 refills | Status: DC | PRN

## 2018-08-26 NOTE — Telephone Encounter (Signed)
Copied from [place] [id number]. Topic: Quick Communication - Rx Refill/Question >> Aug 26, 2018 11:55 AM Yvette Rack wrote: Medication: ALPRAZolam (XANAX) 1 MG tablet, cyclobenzaprine (FLEXERIL) 10 MG tablet, and furosemide (LASIX) 20 MG tablet  Has the patient contacted their pharmacy? yes   Preferred Pharmacy (with phone number or street name): [place], [address] [phone] (Phone)  [phone] (Fax)  Agent: Please be advised that RX refills may take up to 3 business days. We ask that you follow-up with your pharmacy.

## 2018-09-09 ENCOUNTER — Encounter: Payer: Self-pay | Admitting: Hematology & Oncology

## 2018-09-09 ENCOUNTER — Inpatient Hospital Stay: Payer: Medicare Other | Attending: Hematology & Oncology | Admitting: Hematology & Oncology

## 2018-09-09 ENCOUNTER — Other Ambulatory Visit: Payer: Self-pay

## 2018-09-09 ENCOUNTER — Inpatient Hospital Stay: Payer: Medicare Other

## 2018-09-09 VITALS — BP 125/79 | HR 103 | Temp 98.0°F | Resp 16 | Wt 162.0 lb

## 2018-09-09 DIAGNOSIS — D473 Essential (hemorrhagic) thrombocythemia: Secondary | ICD-10-CM

## 2018-09-09 DIAGNOSIS — D509 Iron deficiency anemia, unspecified: Secondary | ICD-10-CM

## 2018-09-09 DIAGNOSIS — Z7982 Long term (current) use of aspirin: Secondary | ICD-10-CM | POA: Diagnosis not present

## 2018-09-09 DIAGNOSIS — R5383 Other fatigue: Secondary | ICD-10-CM | POA: Insufficient documentation

## 2018-09-09 DIAGNOSIS — D500 Iron deficiency anemia secondary to blood loss (chronic): Secondary | ICD-10-CM

## 2018-09-09 DIAGNOSIS — R51 Headache: Secondary | ICD-10-CM | POA: Diagnosis not present

## 2018-09-09 DIAGNOSIS — R61 Generalized hyperhidrosis: Secondary | ICD-10-CM | POA: Insufficient documentation

## 2018-09-09 DIAGNOSIS — Z79899 Other long term (current) drug therapy: Secondary | ICD-10-CM

## 2018-09-09 DIAGNOSIS — D7581 Myelofibrosis: Secondary | ICD-10-CM | POA: Diagnosis not present

## 2018-09-09 DIAGNOSIS — R42 Dizziness and giddiness: Secondary | ICD-10-CM | POA: Insufficient documentation

## 2018-09-09 LAB — RETICULOCYTES
Immature Retic Fract: 21.8 % — ABNORMAL HIGH (ref 2.3–15.9)
RBC.: 3.13 MIL/uL — ABNORMAL LOW (ref 3.87–5.11)
Retic Count, Absolute: 88 10*3/uL (ref 19.0–186.0)
Retic Ct Pct: 2.8 % (ref 0.4–3.1)

## 2018-09-09 LAB — CBC WITH DIFFERENTIAL (CANCER CENTER ONLY)
Abs Immature Granulocytes: 0.03 10*3/uL (ref 0.00–0.07)
Basophils Absolute: 0 10*3/uL (ref 0.0–0.1)
Basophils Relative: 0 %
Eosinophils Absolute: 0.2 10*3/uL (ref 0.0–0.5)
Eosinophils Relative: 2 %
HCT: 33.7 % — ABNORMAL LOW (ref 36.0–46.0)
Hemoglobin: 10.5 g/dL — ABNORMAL LOW (ref 12.0–15.0)
Immature Granulocytes: 0 %
Lymphocytes Relative: 17 %
Lymphs Abs: 1.2 10*3/uL (ref 0.7–4.0)
MCH: 33.5 pg (ref 26.0–34.0)
MCHC: 31.2 g/dL (ref 30.0–36.0)
MCV: 107.7 fL — ABNORMAL HIGH (ref 80.0–100.0)
Monocytes Absolute: 0.5 10*3/uL (ref 0.1–1.0)
Monocytes Relative: 7 %
Neutro Abs: 5.3 10*3/uL (ref 1.7–7.7)
Neutrophils Relative %: 74 %
Platelet Count: 867 10*3/uL — ABNORMAL HIGH (ref 150–400)
RBC: 3.13 MIL/uL — ABNORMAL LOW (ref 3.87–5.11)
RDW: 14.6 % (ref 11.5–15.5)
WBC Count: 7.1 10*3/uL (ref 4.0–10.5)
nRBC: 0 % (ref 0.0–0.2)

## 2018-09-09 LAB — CMP (CANCER CENTER ONLY)
ALT: 14 U/L (ref 0–44)
AST: 24 U/L (ref 15–41)
Albumin: 4.4 g/dL (ref 3.5–5.0)
Alkaline Phosphatase: 74 U/L (ref 38–126)
Anion gap: 9 (ref 5–15)
BUN: 9 mg/dL (ref 8–23)
CO2: 29 mmol/L (ref 22–32)
Calcium: 9.4 mg/dL (ref 8.9–10.3)
Chloride: 103 mmol/L (ref 98–111)
Creatinine: 0.88 mg/dL (ref 0.44–1.00)
GFR, Est AFR Am: >60 mL/min (ref >60)
GFR, Estimated: >60 mL/min (ref >60)
Glucose, Bld: 119 mg/dL — ABNORMAL HIGH (ref 70–99)
Potassium: 3.1 mmol/L — ABNORMAL LOW (ref 3.5–5.1)
Sodium: 141 mmol/L (ref 135–145)
Total Bilirubin: 0.3 mg/dL (ref 0.3–1.2)
Total Protein: 7.1 g/dL (ref 6.5–8.1)

## 2018-09-09 LAB — SAVE SMEAR(SSMR), FOR PROVIDER SLIDE REVIEW

## 2018-09-09 LAB — SAMPLE TO BLOOD BANK

## 2018-09-09 LAB — LACTATE DEHYDROGENASE: LDH: 306 U/L — ABNORMAL HIGH (ref 98–192)

## 2018-09-09 NOTE — Progress Notes (Signed)
Hematology and Oncology Follow Up Visit  Rachael Johnson [id number] [birth year] 68 y.o. 09/09/2018   Principle Diagnosis:  Post-ET myelofibrosis Essential thrombocythemia - JAK2 negative -- NGS (-) Intermittent iron-deficiency anemia  Past Therapy: Hydrea 1000/1000/500 mg by mouth daily - on hold starting 05/14/2017 - DC'd on 05/28/2017  Current Therapy:    Anagrelide 3 mg by mouth BID - started on  07/05/2018 Aspirin 81 mg by mouth daily IV iron as indicated - last dose given on 08/16/2018 Jakafi 12m po BID   HISTORY: Rachael Johnson back for follow-up.  She really is no better.  We did going give her iron when she was last here.  I thought the iron would help her.  However, she just feels tired and fatigued.  She is having more headaches.  Her platelet count is back up again.  Her blood count is now over 800,000.  We clearly are going to have to send her to the myeloproliferative disease expert, Dr[facility] at [facility]  He is only 1 that I can think of who might be able to help [place] with her situation.  Her last bone marrow was in April 2019.  We are going to have to get another bone marrow on her.  I think a bone marrow that is recent will help out Dr. AAnnabelle Harmanif he considers her for a clinical trial.  She has had no bleeding.  She has had no nausea or vomiting.  Her appetite is down a little bit.  There is been no change in bowel or bladder habits.  Overall, her performance status is ECOG 1.      Medications:  Allergies as of 09/09/2018   No Known Allergies     Medication List       Accurate as of September 09, 2018  1:29 PM. Always use your most recent med list.        albuterol 108 (90 Base) MCG/ACT inhaler Commonly known as:  VENTOLIN HFA Inhale 2 puffs into the lungs every 6 (six) hours as needed for wheezing or shortness of breath.   ALPRAZolam 1 MG tablet Commonly known as:  XANAX TAKE 1/2 TO 1 (ONE-HALF TO ONE) TABLET BY MOUTH THREE TIMES DAILY AS NEEDED  FOR ANXIETY   anagrelide 1 MG capsule Commonly known as:  AGRYLIN Take 1 mg by mouth 2 (two) times daily. Take 3 tablets, total of 3 mg twice a day.   aspirin EC 81 MG tablet Take 81 mg by mouth every morning.   atorvastatin 10 MG tablet Commonly known as:  LIPITOR Take 1 tablet (10 mg total) by mouth daily.   budesonide-formoterol 80-4.5 MCG/ACT inhaler Commonly known as:  SYMBICORT Inhale 2 puffs into the lungs 2 (two) times daily.   busPIRone 10 MG tablet Commonly known as:  BUSPAR Take 1 tablet (10 mg total) by mouth 3 (three) times daily.   cetirizine 10 MG tablet Commonly known as:  ZYRTEC Take 1 tablet (10 mg total) by mouth daily.   chlorpheniramine 4 MG tablet Commonly known as:  CHLOR-TRIMETON Take 8 mg by mouth at bedtime.   Cranberry 300 MG tablet Take 300 mg by mouth 2 (two) times daily.   cyclobenzaprine 10 MG tablet Commonly known as:  FLEXERIL Take 1 tablet (10 mg total) by mouth 2 (two) times daily as needed for muscle spasms.   estradiol 0.5 MG tablet Commonly known as:  ESTRACE Take 1 tablet (0.5 mg total) by mouth 2 (two) times daily.  famotidine 40 MG tablet Commonly known as:  PEPCID Take 1 tablet (40 mg total) by mouth daily.   fluconazole 150 MG tablet Commonly known as:  DIFLUCAN Take 1 tablet (150 mg total) by mouth once a week.   fluticasone 50 MCG/ACT nasal spray Commonly known as:  FLONASE Place 2 sprays into both nostrils daily.   furosemide 20 MG tablet Commonly known as:  LASIX Take 1 tablet (20 mg total) by mouth 3 (three) times daily as needed for fluid or edema.   gabapentin 300 MG capsule Commonly known as:  NEURONTIN TAKE 1 CAPSULE BY MOUTH AT BEDTIME   levothyroxine 100 MCG tablet Commonly known as:  SYNTHROID, LEVOTHROID Take 1 tablet (100 mcg total) by mouth daily before breakfast.   magic mouthwash Soln Take 5 mLs by mouth 3 (three) times daily as needed for mouth pain. Components benadryl  525 mg,  hydrocortisone 60 mg and nystatin 0.6 mg. 240 ml - Oral   meclizine 12.5 MG tablet Commonly known as:  ANTIVERT TAKE 1 TABLET BY MOUTH THREE TIMES DAILY AS NEEDED AS NEEDED FOR DIZZINESS   montelukast 10 MG tablet Commonly known as:  SINGULAIR Take 1 tablet (10 mg total) by mouth at bedtime.   MULTIVITAMIN PO Take 1 tablet by mouth every morning.   nystatin 100000 UNIT/ML suspension Commonly known as:  MYCOSTATIN   ondansetron 8 MG tablet Commonly known as:  ZOFRAN TAKE 1/2 TO 1 TABLET BY MOUTH EVERY 8 HOURS AS NEEDED FOR NAUSEA AND VOMITING   oxyCODONE-acetaminophen 10-325 MG tablet Commonly known as:  PERCOCET Take 1 tablet by mouth every 6 (six) hours as needed for pain.   RESTASIS 0.05 % ophthalmic emulsion Generic drug:  cycloSPORINE PLACE ONE DROP INTO EACH EYE EVERY 12 HOURS   ruxolitinib phosphate 20 MG tablet Commonly known as:  JAKAFI Take 1 tablet (20 mg total) by mouth 2 (two) times daily.   silver sulfADIAZINE 1 % cream Commonly known as:  SILVADENE Apply 1 application topically daily.   venlafaxine XR 150 MG 24 hr capsule Commonly known as:  EFFEXOR-XR Take 1 capsule (150 mg total) by mouth daily with breakfast.       Allergies: No Known Allergies  Past Medical History, Surgical history, Social history, and Family History were reviewed and updated.  Review of Systems: Review of Systems  Constitutional: Positive for diaphoresis and malaise/fatigue.  HENT: Negative.   Eyes: Negative.   Respiratory: Positive for shortness of breath.   Cardiovascular: Negative.   Gastrointestinal: Positive for abdominal pain, heartburn and nausea.  Genitourinary: Negative.   Musculoskeletal: Positive for joint pain and myalgias.  Skin: Negative.   Neurological: Positive for dizziness.  Endo/Heme/Allergies: Negative.   Psychiatric/Behavioral: Negative.      Physical Exam:  weight is 162 lb (73.5 kg). Her oral temperature is 98 F (36.7 C). Her blood pressure  is 125/79 and her pulse is 103 (abnormal). Her respiration is 16 and oxygen saturation is 96%.   Wt Readings from Last 3 Encounters:  09/09/18 162 lb (73.5 kg)  08/16/18 162 lb 1.9 oz (73.5 kg)  07/30/18 162 lb (73.5 kg)    Physical Exam Vitals signs reviewed.  HENT:     Head: Normocephalic and atraumatic.  Eyes:     Pupils: Pupils are equal, round, and reactive to light.  Neck:     Musculoskeletal: Normal range of motion.  Cardiovascular:     Rate and Rhythm: Normal rate and regular rhythm.     Heart  sounds: Normal heart sounds.  Pulmonary:     Effort: Pulmonary effort is normal.     Breath sounds: Normal breath sounds.  Abdominal:     General: Bowel sounds are normal.     Palpations: Abdomen is soft.     Comments: Her spleen tip is palpable a couple centimeters below the left costal margin.  Musculoskeletal: Normal range of motion.        General: No tenderness or deformity.  Lymphadenopathy:     Cervical: No cervical adenopathy.  Skin:    General: Skin is warm and dry.     Findings: No erythema or rash.  Neurological:     Mental Status: She is alert and oriented to person, place, and time.  Psychiatric:        Behavior: Behavior normal.        Thought Content: Thought content normal.        Judgment: Judgment normal.      Lab Results  Component Value Date   WBC 7.1 09/09/2018   HGB 10.5 (L) 09/09/2018   HCT 33.7 (L) 09/09/2018   MCV 107.7 (H) 09/09/2018   PLT 867 (H) 09/09/2018   Lab Results  Component Value Date   FERRITIN 839 (H) 08/16/2018   IRON 114 08/16/2018   TIBC 221 (L) 08/16/2018   UIBC 108 (L) 08/16/2018   IRONPCTSAT 51 08/16/2018   Lab Results  Component Value Date   RETICCTPCT 2.8 09/09/2018   RBC 3.13 (L) 09/09/2018   RBC 3.13 (L) 09/09/2018   RETICCTABS 45.8 06/02/2015   No results found for: KPAFRELGTCHN, LAMBDASER, KAPLAMBRATIO No results found for: IGGSERUM, IGA, IGMSERUM No results found for: Odetta Pink, SPEI   Chemistry      Component Value Date/Time   NA 141 09/09/2018 1202   NA 143 07/16/2017 1057   NA 140 11/29/2016 0936   K 3.1 (L) 09/09/2018 1202   K 3.2 (L) 07/16/2017 1057   K 4.2 11/29/2016 0936   CL 103 09/09/2018 1202   CL 104 07/16/2017 1057   CO2 29 09/09/2018 1202   CO2 25 07/16/2017 1057   CO2 27 11/29/2016 0936   BUN 9 09/09/2018 1202   BUN 10 07/16/2017 1057   BUN 18.3 11/29/2016 0936   CREATININE 0.88 09/09/2018 1202   CREATININE 0.8 07/16/2017 1057   CREATININE 0.8 11/29/2016 0936      Component Value Date/Time   CALCIUM 9.4 09/09/2018 1202   CALCIUM 8.9 07/16/2017 1057   CALCIUM 8.9 11/29/2016 0936   ALKPHOS 74 09/09/2018 1202   ALKPHOS 71 07/16/2017 1057   ALKPHOS 77 11/29/2016 0936   AST 24 09/09/2018 1202   AST 19 11/29/2016 0936   ALT 14 09/09/2018 1202   ALT 27 07/16/2017 1057   ALT 10 11/29/2016 0936   BILITOT 0.3 09/09/2018 1202   BILITOT <0.22 11/29/2016 0936      Impression and Plan: Rachael Johnson is a very pleasant 68 yo caucasian female with essential thrombocythemia.   It is now time for [place] to make a referral to [facility] so that she can see Dr. Annabelle Harman.  I will call him about her so that he can get her on his schedule.  She will be set up with a bone marrow biopsy.  I think this is essential.  We have done NGS studies and these have all been normal.  I know that she is JAK2 (-), but I still use Jakafi.  We  will hopefully be able to get her to Dr. Annabelle Harman in 2 or 3 weeks.  I will plan to see her back in a month.  If her headaches seem to get worse, then I will think about increasing her anagrelide.      [name], MD 2/10/20201:29 PM

## 2018-09-10 LAB — IRON AND TIBC
Iron: 86 ug/dL (ref 41–142)
Saturation Ratios: 43 % (ref 21–57)
TIBC: 200 ug/dL — ABNORMAL LOW (ref 236–444)
UIBC: 114 ug/dL — ABNORMAL LOW (ref 120–384)

## 2018-09-10 LAB — FERRITIN: Ferritin: 1066 ng/mL — ABNORMAL HIGH (ref 11–307)

## 2018-09-11 ENCOUNTER — Telehealth: Payer: Self-pay | Admitting: Pharmacist

## 2018-09-11 NOTE — Telephone Encounter (Signed)
Oral Chemotherapy Pharmacist Encounter   Attempted to reach patient for follow up on oral medication: ruxolitnib (Jakafi). No answer. Left VM for patient to call back.   Jalene Mullet, PharmD PGY2 Hematology/ Oncology Pharmacy Resident [facility] [phone] 09/11/2018 2:06 PM

## 2018-09-12 ENCOUNTER — Other Ambulatory Visit: Payer: Self-pay | Admitting: Radiology

## 2018-09-12 NOTE — Telephone Encounter (Signed)
Oral Chemotherapy Pharmacist Encounter   Attempted to reach patient for follow up on oral medication: ruxolitinib (Jakafi). No answer. Left VM for patient to call back.    Jalene Mullet, PharmD PGY2 Hematology/ Oncology Pharmacy Resident [facility] [phone] 09/12/2018 10:37 AM

## 2018-09-13 ENCOUNTER — Other Ambulatory Visit: Payer: Self-pay | Admitting: Radiology

## 2018-09-13 ENCOUNTER — Other Ambulatory Visit: Payer: Self-pay | Admitting: Hematology & Oncology

## 2018-09-13 ENCOUNTER — Other Ambulatory Visit: Payer: Self-pay | Admitting: Family Medicine

## 2018-09-13 DIAGNOSIS — M549 Dorsalgia, unspecified: Principal | ICD-10-CM

## 2018-09-13 DIAGNOSIS — G8929 Other chronic pain: Secondary | ICD-10-CM

## 2018-09-13 MED ORDER — OXYCODONE-ACETAMINOPHEN 10-325 MG PO TABS: 1.0000 | ORAL_TABLET | Freq: Four times a day (QID) | ORAL | 0 refills | Status: DC | PRN

## 2018-09-13 NOTE — Telephone Encounter (Signed)
Copied from [place] [id number]. Topic: Quick Communication - Rx Refill/Question >> Sep 13, 2018  9:09 AM Rayann Heman wrote: Medication:oxyCODONE-acetaminophen (PERCOCET) 10-325 MG tablet [[id number]]   Has the patient contacted their pharmacy? no Preferred Pharmacy (with phone number or street name):[place], [address] [phone] (Phone) [phone] (Fax)   Agent: Please be advised that RX refills may take up to 3 business days. We ask that you follow-up with your pharmacy.

## 2018-09-13 NOTE — Telephone Encounter (Signed)
Requested medication (s) are due for refill today  YES  Requested medication (s) are on the active medication }yes  Last refill   08/28/18                                                                                                                                                                                                                                                                                                                                                                                                                                                                                                                                                                     Future visit scheduled: {Yes/      Requested Prescriptions  Pending Prescriptions Disp Refills   oxyCODONE-acetaminophen (PERCOCET) 10-325 MG tablet 120 tablet 0  Sig: Take 1 tablet by mouth every 6 (six) hours as needed for pain.     Not Delegated - Analgesics:  Opioid Agonist Combinations Failed - 09/13/2018  9:18 AM      Failed - This refill cannot be delegated      Passed - Urine Drug Screen completed in last 360 days.      Passed - Valid encounter within last 6 months    Recent Outpatient Visits          1 month ago Hyperglycemia   [facility] [place], MD   2 months ago Urinary frequency   [facility] [place], MD   4 months ago High risk medication use   [facility] [place], MD   6 months ago Hypomagnesemia   [facility] [place], MD   8 months ago High risk medication use   [facility] [place], MD      Future Appointments            In 2 weeks Mosie Lukes, MD [place] [facility] [place]

## 2018-09-13 NOTE — Telephone Encounter (Signed)
Oral Chemotherapy Pharmacist Encounter  Follow-Up Form  Called patient today to follow up regarding patient's oral chemotherapy medication: Jakafi (ruxolitinib)  Original Start date of oral chemotherapy: 11/2017  Pt reports 0 tablets/doses of ruxolitinib missed in the last 4 weeks.   Pt reports the following side effects: None reported, she stated she feels about the same as normal  Recent labs reviewed: CBC from 09/09/2018   New medications?: None reported  Other Issues: Patient stated that she was going to be referred to [facility] for a second opinion but she has not heard anything yet. She thinks this may be because they are waiting until after her upcoming bone marrow biopsy.  Patient knows to call the office with questions or concerns. Oral Oncology Clinic will continue to follow.  Darl Pikes, PharmD, BCPS, [facility] Hematology/Oncology Clinical Pharmacist [facility] [phone]  09/13/2018 9:30 AM

## 2018-09-16 ENCOUNTER — Encounter (HOSPITAL_COMMUNITY): Payer: Self-pay

## 2018-09-16 ENCOUNTER — Ambulatory Visit (HOSPITAL_COMMUNITY)
Admission: RE | Admit: 2018-09-16 | Discharge: 2018-09-16 | Disposition: A | Discharge status: Home / Self Care | Payer: Medicare Other | Source: Ambulatory Visit | Attending: Hematology & Oncology | Admitting: Hematology & Oncology

## 2018-09-16 DIAGNOSIS — D473 Essential (hemorrhagic) thrombocythemia: Secondary | ICD-10-CM | POA: Diagnosis not present

## 2018-09-16 DIAGNOSIS — D7581 Myelofibrosis: Secondary | ICD-10-CM | POA: Diagnosis not present

## 2018-09-16 DIAGNOSIS — D509 Iron deficiency anemia, unspecified: Secondary | ICD-10-CM | POA: Diagnosis not present

## 2018-09-16 DIAGNOSIS — D539 Nutritional anemia, unspecified: Secondary | ICD-10-CM | POA: Diagnosis not present

## 2018-09-16 LAB — BASIC METABOLIC PANEL
Anion gap: 8 (ref 5–15)
BUN: 11 mg/dL (ref 8–23)
CO2: 25 mmol/L (ref 22–32)
Calcium: 8.6 mg/dL — ABNORMAL LOW (ref 8.9–10.3)
Chloride: 106 mmol/L (ref 98–111)
Creatinine, Ser: 0.73 mg/dL (ref 0.44–1.00)
GFR calc Af Amer: >60 mL/min (ref >60)
GFR calc non Af Amer: >60 mL/min (ref >60)
Glucose, Bld: 96 mg/dL (ref 70–99)
Potassium: 3.8 mmol/L (ref 3.5–5.1)
Sodium: 139 mmol/L (ref 135–145)

## 2018-09-16 LAB — CBC WITH DIFFERENTIAL/PLATELET
Abs Immature Granulocytes: 0.07 10*3/uL (ref 0.00–0.07)
Basophils Absolute: 0.1 10*3/uL (ref 0.0–0.1)
Basophils Relative: 1 %
Eosinophils Absolute: 0.4 10*3/uL (ref 0.0–0.5)
Eosinophils Relative: 4 %
HCT: 33.2 % — ABNORMAL LOW (ref 36.0–46.0)
Hemoglobin: 10.9 g/dL — ABNORMAL LOW (ref 12.0–15.0)
Immature Granulocytes: 1 %
Lymphocytes Relative: 9 %
Lymphs Abs: 0.8 10*3/uL (ref 0.7–4.0)
MCH: 35.3 pg — ABNORMAL HIGH (ref 26.0–34.0)
MCHC: 32.8 g/dL (ref 30.0–36.0)
MCV: 107.4 fL — ABNORMAL HIGH (ref 80.0–100.0)
Monocytes Absolute: 0.8 10*3/uL (ref 0.1–1.0)
Monocytes Relative: 10 %
Neutro Abs: 6.5 10*3/uL (ref 1.7–7.7)
Neutrophils Relative %: 75 %
Platelets: 1093 10*3/uL (ref 150–400)
RBC: 3.09 MIL/uL — ABNORMAL LOW (ref 3.87–5.11)
RDW: 14.1 % (ref 11.5–15.5)
WBC: 8.6 10*3/uL (ref 4.0–10.5)
nRBC: 0.3 % — ABNORMAL HIGH (ref 0.0–0.2)

## 2018-09-16 LAB — PROTIME-INR
INR: 0.94
Prothrombin Time: 12.5 seconds (ref 11.4–15.2)

## 2018-09-16 MED ORDER — FENTANYL CITRATE (PF) 100 MCG/2ML IJ SOLN
INTRAMUSCULAR | Status: AC | PRN
  Administered 2018-09-16 (×2): 50 ug via INTRAVENOUS

## 2018-09-16 MED ORDER — MIDAZOLAM HCL 2 MG/2ML IJ SOLN
INTRAMUSCULAR | Status: AC
  Filled 2018-09-16: qty 4

## 2018-09-16 MED ORDER — FENTANYL CITRATE (PF) 100 MCG/2ML IJ SOLN
INTRAMUSCULAR | Status: AC
  Filled 2018-09-16: qty 2

## 2018-09-16 MED ORDER — SODIUM CHLORIDE 0.9 % IV SOLN
INTRAVENOUS | Status: DC
  Administered 2018-09-16: 08:00:00 via INTRAVENOUS

## 2018-09-16 MED ORDER — LIDOCAINE HCL (PF) 1 % IJ SOLN
INTRAMUSCULAR | Status: AC | PRN
  Administered 2018-09-16: 10 mL

## 2018-09-16 MED ORDER — MIDAZOLAM HCL 2 MG/2ML IJ SOLN
INTRAMUSCULAR | Status: AC | PRN
  Administered 2018-09-16: 2 mg via INTRAVENOUS
  Administered 2018-09-16 (×2): 1 mg via INTRAVENOUS

## 2018-09-16 MED FILL — JAKAFI 20 MG TABLET: 20 | 30 days supply | Qty: 60 | Fill #0

## 2018-09-16 MED FILL — ANAGRELIDE HCL 1 MG CAPS: 1 | 10 days supply | Qty: 60 | Fill #1 | Status: TO

## 2018-09-16 NOTE — H&P (Signed)
Referring Physician(s): Ennever,Peter R  Supervising Physician: Marybelle Killings  Patient Status:  [facility]  Chief Complaint: "I'm here for another bone marrow biopsy"   Subjective: Patient familiar to IR service from prior bone marrow biopsy on 11/16/2017.  She has a history of essential thrombocythemia, secondary myelofibrosis and iron deficiency anemia.  She has worsening of her blood counts and presents again today for CT-guided bone marrow biopsy for further evaluation-prior to possible clinical trial.  She denies fever, chest pain, dyspnea, cough, abdominal/back pain, vomiting or abnormal bleeding.  She does have a headache and occasional nausea.  Past Medical History:  Diagnosis Date  . Acute renal insufficiency 04/09/2017  . Anemia, iron deficiency 07/08/2012  . Arthritis   . Cataract 12/21/2014  . Depression with anxiety 03/24/2011  . Diverticulitis   . Esophageal reflux 08/17/2013  . Essential thrombocythemia ([place]) 01/24/2011  . Frequent episodic tension-type headache   . Gastroenteritis 12/21/2014  . 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 GERD (gastroesophageal reflux disease)   . H. pylori infection 12/19/2012  . Hyperglycemia 12/17/2015  . Hypertension   . Iron deficiency anemia due to chronic blood loss 04/03/2017  . Osteoarthritis 05/24/2017  . Other and unspecified hyperlipidemia 02/25/2013  . Restless leg syndrome 09/30/2014  . Rhabdomyolysis 02/2017  . Scabies 03/28/2015  . Secondary myelofibrosis ([place]) 11/29/2017  . Seizure ([place])    childhood  . Shoulder wound, right, sequela 03/14/2017  . Thyroid disease    Past Surgical History:  Procedure Laterality Date  . ABDOMINAL HYSTERECTOMY     1992  . BREAST CYST ASPIRATION    . BREAST SURGERY  1992   biopsy, benign. Fibrocystic  . UMBILICAL HERNIA REPAIR N/A 09/25/2012   Procedure: HERNIA REPAIR UMBILICAL ADULT;  Surgeon: Harl Bowie, MD;  Location: [facility];  Service: General;  Laterality: N/A;      Allergies: Patient has  no known allergies.  Medications: Prior to Admission medications   Medication Sig Start Date End Date Taking? Authorizing Provider  albuterol (VENTOLIN HFA) 108 (90 Base) MCG/ACT inhaler Inhale 2 puffs into the lungs every 6 (six) hours as needed for wheezing or shortness of breath. 02/07/18  Yes Mosie Lukes, MD  ALPRAZolam Duanne Moron) 1 MG tablet TAKE 1/2 TO 1 (ONE-HALF TO ONE) TABLET BY MOUTH THREE TIMES DAILY AS NEEDED FOR ANXIETY 08/26/18  Yes Mosie Lukes, MD  anagrelide (AGRYLIN) 1 MG capsule Take 1 mg by mouth 2 (two) times daily. Take 3 tablets, total of 3 mg twice a day.   Yes [provider]  aspirin EC 81 MG tablet Take 81 mg by mouth every morning.   Yes [provider]  atorvastatin (LIPITOR) 10 MG tablet Take 1 tablet (10 mg total) by mouth daily. 05/14/18  Yes Mosie Lukes, MD  budesonide-formoterol (SYMBICORT) 80-4.5 MCG/ACT inhaler Inhale 2 puffs into the lungs 2 (two) times daily. 02/07/18  Yes Mosie Lukes, MD  busPIRone (BUSPAR) 10 MG tablet Take 1 tablet (10 mg total) by mouth 3 (three) times daily. 06/17/18  Yes Mosie Lukes, MD  cetirizine (ZYRTEC) 10 MG tablet Take 1 tablet (10 mg total) by mouth daily. 06/17/18  Yes Mosie Lukes, MD  chlorpheniramine (CHLOR-TRIMETON) 4 MG tablet Take 8 mg by mouth at bedtime.   Yes [provider]  Cranberry 300 MG tablet Take 300 mg by mouth 2 (two) times daily.   Yes [provider]  cyclobenzaprine (FLEXERIL) 10 MG tablet Take 1 tablet (10  mg total) by mouth 2 (two) times daily as needed for muscle spasms. 08/26/18  Yes Mosie Lukes, MD  estradiol (ESTRACE) 0.5 MG tablet Take 1 tablet (0.5 mg total) by mouth 2 (two) times daily. 06/17/18  Yes Mosie Lukes, MD  famotidine (PEPCID) 40 MG tablet Take 1 tablet (40 mg total) by mouth daily. 06/17/18  Yes Mosie Lukes, MD  fluconazole (DIFLUCAN) 150 MG tablet Take 1 tablet (150 mg total) by mouth once a week. 06/17/18  Yes Mosie Lukes, MD  fluticasone (FLONASE) 50 MCG/ACT nasal spray Place 2 sprays into both nostrils daily. 06/17/18  Yes Mosie Lukes, MD  furosemide (LASIX) 20 MG tablet Take 1 tablet (20 mg total) by mouth 3 (three) times daily as needed for fluid or edema. 08/26/18  Yes Mosie Lukes, MD  gabapentin (NEURONTIN) 300 MG capsule TAKE 1 CAPSULE BY MOUTH AT BEDTIME 08/13/18  Yes Mosie Lukes, MD  JAKAFI 20 MG tablet TAKE 1 TABLET (20 MG TOTAL) BY MOUTH 2 TIMES DAILY. 09/13/18  Yes [name], MD  levothyroxine (SYNTHROID, LEVOTHROID) 100 MCG tablet Take 1 tablet (100 mcg total) by mouth daily before breakfast. 05/17/18  Yes Mosie Lukes, MD  magic mouthwash SOLN Take 5 mLs by mouth 3 (three) times daily as needed for mouth pain. Components benadryl  525 mg, hydrocortisone 60 mg and nystatin 0.6 mg. 240 ml - Oral 02/08/18  Yes Ennever, Rudell Cobb, MD  meclizine (ANTIVERT) 12.5 MG tablet TAKE 1 TABLET BY MOUTH THREE TIMES DAILY AS NEEDED AS NEEDED FOR DIZZINESS 08/08/18  Yes Mosie Lukes, MD  montelukast (SINGULAIR) 10 MG tablet Take 1 tablet (10 mg total) by mouth at bedtime. 06/17/18  Yes Mosie Lukes, MD  Multiple Vitamin (MULTIVITAMIN PO) Take 1 tablet by mouth every morning.    Yes [provider]  nystatin (MYCOSTATIN) 100000 UNIT/ML suspension  02/08/18  Yes [provider]  ondansetron (ZOFRAN) 8 MG tablet TAKE 1/2 TO 1 TABLET BY MOUTH EVERY 8 HOURS AS NEEDED FOR NAUSEA AND VOMITING 03/27/18  Yes Mosie Lukes, MD  oxyCODONE-acetaminophen (PERCOCET) 10-325 MG tablet Take 1 tablet by mouth every 6 (six) hours as needed for pain. 09/13/18  Yes Mosie Lukes, MD  RESTASIS 0.05 % ophthalmic emulsion PLACE ONE DROP INTO EACH EYE EVERY 12 HOURS 05/09/17  Yes [provider]  silver sulfADIAZINE (SILVADENE) 1 % cream Apply 1 application topically daily. 03/04/18  Yes Mosie Lukes, MD  venlafaxine XR (EFFEXOR-XR) 150 MG 24 hr capsule Take 1 capsule (150 mg total) by mouth  daily with breakfast. 06/17/18  Yes Mosie Lukes, MD     Vital Signs: BP (!) 166/99   Pulse 80   Temp (!) 97.5 F (36.4 C) (Oral)   Resp 16   LMP 07/31/1990   SpO2 95%   Physical Exam awake, alert.  Chest with distant breath sounds bilaterally.  Heart with regular rate and rhythm.  Abdomen soft, positive bowel sounds, mild generalized tenderness to palpation; extremities with full range of motion.  Imaging: No results found.  Labs:  CBC: Recent Labs    07/05/18 1019 07/26/18 0831 08/16/18 0933 09/09/18 1202  WBC 5.8 6.2 4.3 7.1  HGB 11.2* 9.9* 9.7* 10.5*  HCT 36.4 32.7* 30.8* 33.7*  PLT 895* 478* 521* 867*    COAGS: Recent Labs    11/16/17 0715  INR 0.95    BMP: Recent Labs    07/05/18 1019  07/26/18 0831 08/16/18 0933 09/09/18 1202  NA 141 139 142 141  K 3.5 3.9 3.8 3.1*  CL 100 103 103 103  CO2 31 31 32 29  GLUCOSE 105* 92 113* 119*  BUN _0 CALCIUM 9.4 8.5* 9.8 9.4  CREATININE 0.93 0.93 1.01* 0.88  GFRNONAA >60 >60 58* >60  GFRAA >60 >60 >60 >60    LIVER FUNCTION TESTS: Recent Labs    07/05/18 1019 07/26/18 0831 08/16/18 0933 09/09/18 1202  BILITOT 0.3 0.2* 0.3 0.3  AST _1 ALT _2 ALKPHOS 98 70 69 74  PROT 7.9 7.0 6.9 7.1  ALBUMIN 4.7 3.9 4.4 4.4    Assessment and Plan: Pt with history of essential thrombocythemia, secondary myelofibrosis and iron deficiency anemia.  She has worsening of her blood counts and presents  today for CT-guided bone marrow biopsy for further evaluation-prior to possible clinical trial.Risks and benefits of procedure was discussed with the patient and/or patient's family including, but not limited to bleeding, infection, damage to adjacent structures or low yield requiring additional tests.  All of the questions were answered and there is agreement to proceed.  Consent signed and in chart.     Electronically Signed: D. Rowe Robert, PA-C 09/16/2018, 8:23 AM   I spent a  total of 20 minutes at the the patient's bedside AND on the patient's hospital floor or unit, greater than 50% of which was counseling/coordinating care for CT-guided bone marrow biopsy

## 2018-09-16 NOTE — Progress Notes (Signed)
CRITICAL VALUE ALERT  Critical Value:  Platelets 1093  Date & Time Notied:  09/16/18 0825  Provider Notified: Rowe Robert, PA  Orders Received/Actions taken: no further action

## 2018-09-16 NOTE — Procedures (Signed)
R iliac BM aspirate and core EBL 0 Comp 0 

## 2018-09-16 NOTE — Discharge Instructions (Signed)

## 2018-09-20 ENCOUNTER — Encounter: Payer: Self-pay | Admitting: Hematology & Oncology

## 2018-09-25 ENCOUNTER — Other Ambulatory Visit: Payer: Self-pay | Admitting: Family Medicine

## 2018-09-25 DIAGNOSIS — R300 Dysuria: Secondary | ICD-10-CM

## 2018-09-25 DIAGNOSIS — I10 Essential (primary) hypertension: Secondary | ICD-10-CM

## 2018-09-25 DIAGNOSIS — E559 Vitamin D deficiency, unspecified: Secondary | ICD-10-CM

## 2018-09-25 DIAGNOSIS — M549 Dorsalgia, unspecified: Principal | ICD-10-CM

## 2018-09-25 DIAGNOSIS — G8929 Other chronic pain: Secondary | ICD-10-CM

## 2018-09-25 DIAGNOSIS — R739 Hyperglycemia, unspecified: Secondary | ICD-10-CM

## 2018-09-26 ENCOUNTER — Encounter: Payer: Self-pay | Admitting: Hematology & Oncology

## 2018-09-27 ENCOUNTER — Other Ambulatory Visit: Payer: Self-pay

## 2018-09-27 ENCOUNTER — Inpatient Hospital Stay: Payer: Medicare Other

## 2018-09-27 ENCOUNTER — Inpatient Hospital Stay (HOSPITAL_BASED_OUTPATIENT_CLINIC_OR_DEPARTMENT_OTHER): Payer: Medicare Other | Admitting: Hematology & Oncology

## 2018-09-27 ENCOUNTER — Encounter: Payer: Self-pay | Admitting: Hematology & Oncology

## 2018-09-27 ENCOUNTER — Other Ambulatory Visit: Payer: Self-pay | Admitting: Hematology & Oncology

## 2018-09-27 VITALS — BP 152/79 | HR 85 | Temp 98.2°F | Resp 18 | Wt 162.0 lb

## 2018-09-27 DIAGNOSIS — D7581 Myelofibrosis: Secondary | ICD-10-CM | POA: Diagnosis not present

## 2018-09-27 DIAGNOSIS — Z79899 Other long term (current) drug therapy: Secondary | ICD-10-CM

## 2018-09-27 DIAGNOSIS — R5383 Other fatigue: Secondary | ICD-10-CM

## 2018-09-27 DIAGNOSIS — D509 Iron deficiency anemia, unspecified: Secondary | ICD-10-CM | POA: Diagnosis not present

## 2018-09-27 DIAGNOSIS — D473 Essential (hemorrhagic) thrombocythemia: Secondary | ICD-10-CM | POA: Diagnosis not present

## 2018-09-27 DIAGNOSIS — D500 Iron deficiency anemia secondary to blood loss (chronic): Secondary | ICD-10-CM

## 2018-09-27 DIAGNOSIS — Z7982 Long term (current) use of aspirin: Secondary | ICD-10-CM | POA: Diagnosis not present

## 2018-09-27 DIAGNOSIS — R61 Generalized hyperhidrosis: Secondary | ICD-10-CM

## 2018-09-27 DIAGNOSIS — R51 Headache: Secondary | ICD-10-CM

## 2018-09-27 LAB — CBC WITH DIFFERENTIAL (CANCER CENTER ONLY)
Abs Immature Granulocytes: 0.04 10*3/uL (ref 0.00–0.07)
Basophils Absolute: 0 10*3/uL (ref 0.0–0.1)
Basophils Relative: 0 %
Eosinophils Absolute: 0.2 10*3/uL (ref 0.0–0.5)
Eosinophils Relative: 5 %
HCT: 32.7 % — ABNORMAL LOW (ref 36.0–46.0)
Hemoglobin: 10.4 g/dL — ABNORMAL LOW (ref 12.0–15.0)
Immature Granulocytes: 1 %
Lymphocytes Relative: 18 %
Lymphs Abs: 0.8 10*3/uL (ref 0.7–4.0)
MCH: 33.9 pg (ref 26.0–34.0)
MCHC: 31.8 g/dL (ref 30.0–36.0)
MCV: 106.5 fL — ABNORMAL HIGH (ref 80.0–100.0)
Monocytes Absolute: 0.4 10*3/uL (ref 0.1–1.0)
Monocytes Relative: 9 %
Neutro Abs: 3.1 10*3/uL (ref 1.7–7.7)
Neutrophils Relative %: 67 %
Platelet Count: 691 10*3/uL — ABNORMAL HIGH (ref 150–400)
RBC: 3.07 MIL/uL — ABNORMAL LOW (ref 3.87–5.11)
RDW: 13.9 % (ref 11.5–15.5)
WBC Count: 4.6 10*3/uL (ref 4.0–10.5)
nRBC: 0 % (ref 0.0–0.2)

## 2018-09-27 LAB — RETICULOCYTES
Immature Retic Fract: 14.2 % (ref 2.3–15.9)
RBC.: 3.07 MIL/uL — ABNORMAL LOW (ref 3.87–5.11)
Retic Count, Absolute: 35.3 10*3/uL (ref 19.0–186.0)
Retic Ct Pct: 1.2 % (ref 0.4–3.1)

## 2018-09-27 LAB — SAVE SMEAR(SSMR), FOR PROVIDER SLIDE REVIEW

## 2018-09-27 LAB — CMP (CANCER CENTER ONLY)
ALT: 20 U/L (ref 0–44)
AST: 31 U/L (ref 15–41)
Albumin: 4.3 g/dL (ref 3.5–5.0)
Alkaline Phosphatase: 91 U/L (ref 38–126)
Anion gap: 6 (ref 5–15)
BUN: 15 mg/dL (ref 8–23)
CO2: 30 mmol/L (ref 22–32)
Calcium: 9.1 mg/dL (ref 8.9–10.3)
Chloride: 102 mmol/L (ref 98–111)
Creatinine: 0.84 mg/dL (ref 0.44–1.00)
GFR, Est AFR Am: >60 mL/min (ref >60)
GFR, Estimated: >60 mL/min (ref >60)
Glucose, Bld: 111 mg/dL — ABNORMAL HIGH (ref 70–99)
Potassium: 3.8 mmol/L (ref 3.5–5.1)
Sodium: 138 mmol/L (ref 135–145)
Total Bilirubin: 0.3 mg/dL (ref 0.3–1.2)
Total Protein: 7.4 g/dL (ref 6.5–8.1)

## 2018-09-27 MED ORDER — PEGINTERFERON ALFA-2A 180 MCG/ML ~~LOC~~ SOLN: 45.0000 ug | SUBCUTANEOUS | 6 refills | Status: DC

## 2018-09-27 NOTE — Progress Notes (Signed)
Hematology and Oncology Follow Up Visit  MAURINA FAWAZ [id number] Aug 04, 1950 68 y.o. 09/27/2018   Principle Diagnosis:  Post-ET myelofibrosis Essential thrombocythemia - JAK2 negative -- NGS (-) Intermittent iron-deficiency anemia  Past Therapy: Hydrea 1000/1000/500 mg by mouth daily - on hold starting 05/14/2017 - DC'd on 05/28/2017  Current Therapy:    Anagrelide 3 mg by mouth BID - started on  07/05/2018 Aspirin 81 mg by mouth daily IV iron as indicated - last dose given on 08/16/2018 Jakafi 81m po BID PEG-Interferon 45 mcg sq q week -- start 10/07/2018   HISTORY: Ms. WTackettis back for follow-up.  Unfortunately, we just have not had much success with the anagrelide or the [facility]  We did go ahead and do a bone marrow biopsy on her.  This was done on 09/16/2018.  The pathology report (([id number] showed a variably cellular marrow with features consistent with essential thrombocythemia.  There is mild reticulin fibrosis.  No increase in blast cells.  Cytogenetic analysis did not show any abnormal chromosomes.  [facility]  As always, Dr. AAnnabelle Harmanhas come through.  He recommended that we try her on PEG-interferon.  This I think would be reasonable.  I think this would be the next option for [place]  He recommended that we start off with a dose of 45 mcg subcu weekly and then try to work her way up from there.  He thinks that this will help.  He thinks that this will get her platelet count back down.  I had Ms. WSparecome in today.  I talked to her about this.  Unfortunately, her insurance will not allow [place] provide the interferon for her.  I went over side effects with her.  We will have to watch out for the thyroid.  We will have to make sure that she does not have too much of a temperature, myalgias/arthralgias, diarrhea.  [facility]  I might show much anagrelide she has left as this is on back order.  Her appetite is  okay.  She has a performance status right now of ECOG 1.    Medications:  Allergies as of 09/27/2018   No Known Allergies     Medication List       Accurate as of September 27, 2018  2:11 PM. Always use your most recent med list.        albuterol 108 (90 Base) MCG/ACT inhaler Commonly known as:  VENTOLIN HFA Inhale 2 puffs into the lungs every 6 (six) hours as needed for wheezing or shortness of breath.   ALPRAZolam 1 MG tablet Commonly known as:  XANAX TAKE 1/2 TO 1 (ONE-HALF TO ONE) TABLET BY MOUTH THREE TIMES DAILY AS NEEDED FOR ANXIETY   anagrelide 1 MG capsule Commonly known as:  AGRYLIN Take 1 mg by mouth 2 (two) times daily. Take 3 tablets, total of 3 mg twice a day.   aspirin EC 81 MG tablet Take 81 mg by mouth every morning.   atorvastatin 10 MG tablet Commonly known as:  LIPITOR Take 1 tablet (10 mg total) by mouth daily.   budesonide-formoterol 80-4.5 MCG/ACT inhaler Commonly known as:  SYMBICORT Inhale 2 puffs into the lungs 2 (two) times daily.   busPIRone 10 MG tablet Commonly known as:  BUSPAR Take 1 tablet (10 mg total) by mouth 3 (three) times daily.   cetirizine 10 MG tablet Commonly known as:  ZYRTEC Take 1  tablet (10 mg total) by mouth daily.   chlorpheniramine 4 MG tablet Commonly known as:  CHLOR-TRIMETON Take 8 mg by mouth at bedtime.   Cranberry 300 MG tablet Take 300 mg by mouth 2 (two) times daily.   cyclobenzaprine 10 MG tablet Commonly known as:  FLEXERIL TAKE 1 TABLET BY MOUTH TWICE DAILY AS NEEDED FOR MUSCLE SPASM   estradiol 0.5 MG tablet Commonly known as:  ESTRACE Take 1 tablet (0.5 mg total) by mouth 2 (two) times daily.   famotidine 40 MG tablet Commonly known as:  PEPCID Take 1 tablet (40 mg total) by mouth daily.   fluconazole 150 MG tablet Commonly known as:  DIFLUCAN Take 1 tablet (150 mg total) by mouth once a week.   fluticasone 50 MCG/ACT nasal spray Commonly known as:  FLONASE Place 2 sprays into both  nostrils daily.   furosemide 20 MG tablet Commonly known as:  LASIX Take 1 tablet (20 mg total) by mouth 3 (three) times daily as needed for fluid or edema.   gabapentin 300 MG capsule Commonly known as:  NEURONTIN TAKE 1 CAPSULE BY MOUTH AT BEDTIME   JAKAFI 20 MG tablet Generic drug:  ruxolitinib phosphate TAKE 1 TABLET (20 MG TOTAL) BY MOUTH 2 TIMES DAILY.   levothyroxine 100 MCG tablet Commonly known as:  SYNTHROID, LEVOTHROID Take 1 tablet (100 mcg total) by mouth daily before breakfast.   magic mouthwash Soln Take 5 mLs by mouth 3 (three) times daily as needed for mouth pain. Components benadryl  525 mg, hydrocortisone 60 mg and nystatin 0.6 mg. 240 ml - Oral   meclizine 12.5 MG tablet Commonly known as:  ANTIVERT TAKE 1 TABLET BY MOUTH THREE TIMES DAILY AS NEEDED FOR DIZZINESS   montelukast 10 MG tablet Commonly known as:  SINGULAIR Take 1 tablet (10 mg total) by mouth at bedtime.   MULTIVITAMIN PO Take 1 tablet by mouth every morning.   nystatin 100000 UNIT/ML suspension Commonly known as:  MYCOSTATIN   ondansetron 8 MG tablet Commonly known as:  ZOFRAN TAKE 1/2 TO 1 TABLET BY MOUTH EVERY 8 HOURS AS NEEDED FOR NAUSEA AND VOMITING   oxyCODONE-acetaminophen 10-325 MG tablet Commonly known as:  PERCOCET Take 1 tablet by mouth every 6 (six) hours as needed for pain.   peginterferon alfa-2a 180 MCG/ML injection Commonly known as:  PEGASYS Inject 0.25 mLs (45 mcg total) into the skin every 7 (seven) days.   RESTASIS 0.05 % ophthalmic emulsion Generic drug:  cycloSPORINE PLACE ONE DROP INTO EACH EYE EVERY 12 HOURS   silver sulfADIAZINE 1 % cream Commonly known as:  SILVADENE Apply 1 application topically daily.   venlafaxine XR 150 MG 24 hr capsule Commonly known as:  EFFEXOR-XR Take 1 capsule (150 mg total) by mouth daily with breakfast.       Allergies: No Known Allergies  Past Medical History, Surgical history, Social history, and Family History were  reviewed and updated.  Review of Systems: Review of Systems  Constitutional: Positive for diaphoresis and malaise/fatigue.  HENT: Negative.   Eyes: Negative.   Respiratory: Positive for shortness of breath.   Cardiovascular: Negative.   Gastrointestinal: Positive for abdominal pain, heartburn and nausea.  Genitourinary: Negative.   Musculoskeletal: Positive for joint pain and myalgias.  Skin: Negative.   Neurological: Positive for dizziness.  Endo/Heme/Allergies: Negative.   Psychiatric/Behavioral: Negative.      Physical Exam:  weight is 162 lb (73.5 kg). Her oral temperature is 98.2 F (36.8 C). Her blood  pressure is 152/79 (abnormal) and her pulse is 85. Her respiration is 18 and oxygen saturation is 98%.   Wt Readings from Last 3 Encounters:  09/27/18 162 lb (73.5 kg)  09/09/18 162 lb (73.5 kg)  08/16/18 162 lb 1.9 oz (73.5 kg)    Physical Exam Vitals signs reviewed.  HENT:     Head: Normocephalic and atraumatic.  Eyes:     Pupils: Pupils are equal, round, and reactive to light.  Neck:     Musculoskeletal: Normal range of motion.  Cardiovascular:     Rate and Rhythm: Normal rate and regular rhythm.     Heart sounds: Normal heart sounds.  Pulmonary:     Effort: Pulmonary effort is normal.     Breath sounds: Normal breath sounds.  Abdominal:     General: Bowel sounds are normal.     Palpations: Abdomen is soft.     Comments: Her spleen tip is palpable a couple centimeters below the left costal margin.  Musculoskeletal: Normal range of motion.        General: No tenderness or deformity.  Lymphadenopathy:     Cervical: No cervical adenopathy.  Skin:    General: Skin is warm and dry.     Findings: No erythema or rash.  Neurological:     Mental Status: She is alert and oriented to person, place, and time.  Psychiatric:        Behavior: Behavior normal.        Thought Content: Thought content normal.        Judgment: Judgment normal.      Lab Results    Component Value Date   WBC 4.6 09/27/2018   HGB 10.4 (L) 09/27/2018   HCT 32.7 (L) 09/27/2018   MCV 106.5 (H) 09/27/2018   PLT 691 (H) 09/27/2018   Lab Results  Component Value Date   FERRITIN 1,066 (H) 09/09/2018   IRON 86 09/09/2018   TIBC 200 (L) 09/09/2018   UIBC 114 (L) 09/09/2018   IRONPCTSAT 43 09/09/2018   Lab Results  Component Value Date   RETICCTPCT 1.2 09/27/2018   RBC 3.07 (L) 09/27/2018   RBC 3.07 (L) 09/27/2018   RETICCTABS 45.8 06/02/2015   No results found for: KPAFRELGTCHN, LAMBDASER, KAPLAMBRATIO No results found for: IGGSERUM, IGA, IGMSERUM No results found for: Odetta Pink, SPEI   Chemistry      Component Value Date/Time   NA 138 09/27/2018 1323   NA 143 07/16/2017 1057   NA 140 11/29/2016 0936   K 3.8 09/27/2018 1323   K 3.2 (L) 07/16/2017 1057   K 4.2 11/29/2016 0936   CL 102 09/27/2018 1323   CL 104 07/16/2017 1057   CO2 30 09/27/2018 1323   CO2 25 07/16/2017 1057   CO2 27 11/29/2016 0936   BUN 15 09/27/2018 1323   BUN 10 07/16/2017 1057   BUN 18.3 11/29/2016 0936   CREATININE 0.84 09/27/2018 1323   CREATININE 0.8 07/16/2017 1057   CREATININE 0.8 11/29/2016 0936      Component Value Date/Time   CALCIUM 9.1 09/27/2018 1323   CALCIUM 8.9 07/16/2017 1057   CALCIUM 8.9 11/29/2016 0936   ALKPHOS 91 09/27/2018 1323   ALKPHOS 71 07/16/2017 1057   ALKPHOS 77 11/29/2016 0936   AST 31 09/27/2018 1323   AST 19 11/29/2016 0936   ALT 20 09/27/2018 1323   ALT 27 07/16/2017 1057   ALT 10 11/29/2016 0936   BILITOT 0.3 09/27/2018  [place] <0.22 11/29/2016 0936      Impression and Plan: Ms. Andrzejewski is a very pleasant 68 yo caucasian female with essential thrombocythemia.   Hopefully, we will be able to get the PEG interferon started soon.  I am unsure how much this will cost.  We will probably have to get assistance for Ms. Blackburn.  At least, her platelet count did not go up further  when I last saw her.  Her platelet count has come down nicely.  I we will plan to get her back to see [place] once I know when she will get the interferon.  I spent about 40 minutes with her today.  All the time spent face-to-face with her.  I spent quite a bit of time trying to get the EPIC system to provide the information for me for the prescription.        [name], MD 2/28/20202:11 PM

## 2018-09-30 ENCOUNTER — Ambulatory Visit (INDEPENDENT_AMBULATORY_CARE_PROVIDER_SITE_OTHER): Payer: Medicare Other | Admitting: Family Medicine

## 2018-09-30 ENCOUNTER — Encounter (HOSPITAL_COMMUNITY): Payer: Self-pay | Admitting: Hematology & Oncology

## 2018-09-30 ENCOUNTER — Telehealth: Payer: Self-pay | Admitting: Pharmacy Technician

## 2018-09-30 ENCOUNTER — Other Ambulatory Visit: Payer: Self-pay | Admitting: Family Medicine

## 2018-09-30 ENCOUNTER — Telehealth: Payer: Self-pay | Admitting: Pharmacist

## 2018-09-30 VITALS — BP 110/68 | HR 90 | Temp 97.2°F | Resp 18 | Wt 159.0 lb

## 2018-09-30 DIAGNOSIS — D473 Essential (hemorrhagic) thrombocythemia: Secondary | ICD-10-CM

## 2018-09-30 DIAGNOSIS — E559 Vitamin D deficiency, unspecified: Secondary | ICD-10-CM

## 2018-09-30 DIAGNOSIS — R739 Hyperglycemia, unspecified: Secondary | ICD-10-CM

## 2018-09-30 DIAGNOSIS — Z79899 Other long term (current) drug therapy: Secondary | ICD-10-CM

## 2018-09-30 DIAGNOSIS — F418 Other specified anxiety disorders: Secondary | ICD-10-CM | POA: Diagnosis not present

## 2018-09-30 DIAGNOSIS — E039 Hypothyroidism, unspecified: Secondary | ICD-10-CM

## 2018-09-30 DIAGNOSIS — D75839 Thrombocytosis, unspecified: Secondary | ICD-10-CM

## 2018-09-30 LAB — IRON AND TIBC
Iron: 174 ug/dL — ABNORMAL HIGH (ref 41–142)
Saturation Ratios: 90 % — ABNORMAL HIGH (ref 21–57)
TIBC: 193 ug/dL — ABNORMAL LOW (ref 236–444)
UIBC: 19 ug/dL — ABNORMAL LOW (ref 120–384)

## 2018-09-30 LAB — TSH: TSH: <0.08 u[IU]/mL — ABNORMAL LOW (ref 0.308–3.960)

## 2018-09-30 LAB — FERRITIN: Ferritin: 1833 ng/mL — ABNORMAL HIGH (ref 11–307)

## 2018-09-30 MED ORDER — BUSPIRONE HCL 15 MG PO TABS: 15.0000 mg | ORAL_TABLET | Freq: Four times a day (QID) | ORAL | 2 refills | Status: DC | PRN

## 2018-09-30 MED ORDER — ATORVASTATIN CALCIUM 10 MG PO TABS: 10.0000 mg | ORAL_TABLET | Freq: Every day | ORAL | 3 refills | Status: DC

## 2018-09-30 MED ORDER — FAMOTIDINE 40 MG PO TABS: 40.0000 mg | ORAL_TABLET | Freq: Every day | ORAL | 1 refills | Status: DC

## 2018-09-30 MED ORDER — BUSPIRONE HCL 10 MG PO TABS: 10.0000 mg | ORAL_TABLET | Freq: Three times a day (TID) | ORAL | 1 refills | Status: DC

## 2018-09-30 MED ORDER — VENLAFAXINE HCL ER 150 MG PO CP24: 150.0000 mg | ORAL_CAPSULE | Freq: Every day | ORAL | 1 refills | Status: DC

## 2018-09-30 MED ORDER — ONDANSETRON HCL 8 MG PO TABS: ORAL_TABLET | ORAL | 1 refills | Status: DC

## 2018-09-30 MED ORDER — ALPRAZOLAM 1 MG PO TABS: ORAL_TABLET | ORAL | 1 refills | Status: DC

## 2018-09-30 MED ORDER — ESTRADIOL 0.5 MG PO TABS: 0.5000 mg | ORAL_TABLET | Freq: Two times a day (BID) | ORAL | 1 refills | Status: DC

## 2018-09-30 MED ORDER — PEGINTERFERON ALFA-2A 180 MCG/ML ~~LOC~~ SOLN: 45.0000 ug | SUBCUTANEOUS | 6 refills | Status: DC

## 2018-09-30 NOTE — Assessment & Plan Note (Signed)
hgba1c acceptable, minimize simple carbs. Increase exercise as tolerated.  

## 2018-09-30 NOTE — Telephone Encounter (Signed)
Copied from [place] [phone]. Topic: Quick Communication - Rx Refill/Question >> Sep 30, 2018  4:19 PM Alanda Slim E wrote: Medication: ALPRAZolam (XANAX) 1 MG tablet atorvastatin (LIPITOR) 10 MG tablet busPIRone (BUSPAR) 15 MG tablet  estradiol (ESTRACE) 0.5 MG tablet famotidine (PEPCID) 40 MG tablet ondansetron (ZOFRAN) 8 MG tablet peginterferon alfa-2a (PEGASYS) 180 MCG/ML injection  venlafaxine XR (EFFEXOR-XR) 150 MG 24 hr capsule  Has the patient contacted their pharmacy? Yes - sent to wrong pharmacy / please send the the preferred pharmacy below   Preferred Pharmacy (with phone number or street name): [place], [address] [phone] (Phone) [phone] (Fax)    Agent: Please be advised that RX refills may take up to 3 business days. We ask that you follow-up with your pharmacy.

## 2018-09-30 NOTE — Assessment & Plan Note (Signed)
Very stressed due to worsening Thrombocytopenia. She is anxious about adding the PEG Interferon per a specialist at [facility]. Will increase Buspar 15 mg qid continue Venlafaxine and Alprazolam

## 2018-09-30 NOTE — Assessment & Plan Note (Signed)
Supplement and mnitor

## 2018-09-30 NOTE — Telephone Encounter (Signed)
Oral Oncology Pharmacist Encounter  Received new prescription for peginterferon alfa-2a (PEGASYS) for the treatment of ET, planned duration until disease progression or unacceptable drug toxicity. The study "Pegylated interferon alfa-2a for polycythemia vera or essential thrombocythemia resistant or intolerant to hydroxyurea" published in blood Oct 2019 found peg-interferon to be an effective therapy for ET.  CBC/CMP from 09/27/2018 assessed, no relevant lab abnormalities. Prescription dose and frequency assessed.   Current medication list in Epic reviewed, no DDIs with peginterferon alfa-2a identified.  Prescription has been e-scribed to the [facility] for benefits analysis and approval.  Oral Oncology Clinic will continue to follow for insurance authorization, copayment issues, initial counseling and start date.  Darl Pikes, PharmD, BCPS, [facility] Hematology/Oncology Clinical Pharmacist [facility] [phone]  09/30/2018 1:26 PM

## 2018-09-30 NOTE — Patient Instructions (Addendum)
Your hematologist recommended PEG Interferon  Encouraged increased hydration and fiber in diet. Daily probiotics. If bowels not moving can use MOM 2 tbls po in 4 oz of warm prune juice by mouth every 2-3 days. If no results then repeat in 4 hours with  Dulcolax suppository pr, may repeat again in 4 more hours as needed. Seek care if symptoms worsen. Consider daily Miralax and/or Dulcolax if symptoms persist.   Thrombocytopenia  Thrombocytopenia is a condition in which you have an abnormally decreased number of platelets in your blood. Platelets are also called thrombocytes. Platelets are needed for blood clotting. Some cases of thrombocytopenia are mild while others are more severe. What are the causes? This condition may be caused by:  Decreased production of platelets. This can be caused by: ? Aplastic anemia, in which your bone marrow quits making blood cells. ? Cancer in the bone marrow. ? Use of certain medicines, including chemotherapy. ? Infection in the bone marrow. ? Heavy alcohol consumption.  Increased destruction of platelets. This can be caused by: ? Certain immune diseases. ? Use of certain drugs. ? Certain blood clotting disorders. ? Certain inherited disorders. ? Certain bleeding disorders. ? Pregnancy.  Having an enlarged spleen (hypersplenism). In hypersplenism, the spleen gathers up platelets from circulation. This means that the platelets are not available to help with blood clotting. The spleen can be enlarged because of cirrhosis or other conditions. What are the signs or symptoms? Symptoms of this condition are side effects of poor blood clotting. They will vary depending on how low the platelet counts are. Symptoms may include:  Abnormal bleeding.  Nosebleeds.  Heavy menstrual periods.  Blood in the urine or stool (feces).  A purplish discoloration in the skin (purpura).  Bruising.  A rash that looks like pinpoint, purplish-red spots (petechiae) on the  skin and mucous membranes. How is this diagnosed? This condition may be diagnosed with blood tests and a physical exam. Sometimes, a sample of bone marrow may be removed to look for the original cells (megakaryocytes) that make platelets. How is this treated? Treatment for this condition depends on the cause. Treatment options may include:  Treatment of another condition that is causing the low platelet count.  Medicines to help protect your platelets from being destroyed.  A replacement (transfusion) of platelets to stop or prevent bleeding.  Surgery to remove the spleen. Follow these instructions at home: General instructions  Check your skin and the linings inside your mouth for bruising or bleeding as told by your health care provider.  Check your sputum, urine, and stool for blood as told by your health care provider.  Ask your health care provider if it is okay for you to drink alcohol.  Take over-the-counter and prescription medicines only as told by your health care provider.  Tell all of your health care providers, including dentists and eye doctors, about your condition. Activity  Until your health care provider says it is okay. ? Do not return to any activities that could cause bumps or bruises.  Take extra care not to cut yourself when you shave or when you use scissors, needles, knives, and other tools.  Take extra care not to burn yourself when ironing or cooking. Contact a health care provider if:  You have unexplained bruising. Get help right away if:  You have active bleeding from anywhere on your body.  You have blood in your sputum, urine, or stool. This information is not intended to replace advice given to  you by your health care provider. Make sure you discuss any questions you have with your health care provider. Document Released: 07/17/2005 Document Revised: 03/19/2016 Document Reviewed: 01/18/2015 Elsevier Interactive Patient Education  2019  [employer].

## 2018-09-30 NOTE — Telephone Encounter (Signed)
Requested medication (s) are due for refill today: Yes  Requested medication (s) are on the active medication list: Yes  Last refill:  09/30/18  Future visit scheduled: Yes  Notes to clinic:  All sent to the wrong pharmacy today, please resend.  Requested Prescriptions  Pending Prescriptions Disp Refills   ALPRAZolam (XANAX) 1 MG tablet 90 tablet 1    Sig: TAKE 1/2 TO 1 (ONE-HALF TO ONE) TABLET BY MOUTH THREE TIMES DAILY AS NEEDED FOR ANXIETY     Not Delegated - Psychiatry:  Anxiolytics/Hypnotics Failed - 09/30/2018  5:23 PM      Failed - This refill cannot be delegated      Passed - Urine Drug Screen completed in last 360 days.      Passed - Valid encounter within last 6 months    Recent Outpatient Visits          Today High risk medication use   [facility] at [place], [place]   2 months ago Hyperglycemia   Archivist at [place], MD   3 months ago Urinary frequency   Archivist at [place], MD   4 months ago High risk medication use   Archivist at [place], MD   7 months ago Hypomagnesemia   Archivist at [place], MD      Future Appointments            In 2 months Mosie Lukes, MD [place] at [facility]          atorvastatin (LIPITOR) 10 MG tablet 90 tablet 3    Sig: Take 1 tablet (10 mg total) by mouth daily.     Cardiovascular:  Antilipid - Statins Failed - 09/30/2018  5:23 PM      Failed - Total Cholesterol in normal range and within 360 days    Cholesterol  Date Value Ref Range Status  05/06/2018 236 (H) 0 - 200 mg/dL Final    Comment:    ATP III Classification       Desirable:  < 200 mg/dL               Borderline High:  200 - 239 mg/dL          High:  > = 240 mg/dL         Failed - LDL in  normal range and within 360 days    LDL Cholesterol  Date Value Ref Range Status  02/12/2017 82 0 - 99 mg/dL Final         Failed - Triglycerides in normal range and within 360 days    Triglycerides  Date Value Ref Range Status  05/06/2018 322.0 (H) 0.0 - 149.0 mg/dL Final    Comment:    Normal:  <150 mg/dLBorderline High:  150 - 199 mg/dL         Passed - HDL in normal range and within 360 days    HDL  Date Value Ref Range Status  05/06/2018 59.00 >39.00 mg/dL Final         Passed - Patient is not pregnant      Passed - Valid encounter within last 12 months    Recent Outpatient Visits          Today High risk medication  use   Archivist at [place], MD   2 months ago Hyperglycemia   Archivist at [place], MD   3 months ago Urinary frequency   Archivist at [place], MD   4 months ago High risk medication use   Archivist at [place], MD   7 months ago Hypomagnesemia   Archivist at [place], MD      Future Appointments            In 2 months Mosie Lukes, MD [place] at [facility]          estradiol (ESTRACE) 0.5 MG tablet 180 tablet 1    Sig: Take 1 tablet (0.5 mg total) by mouth 2 (two) times daily.     OB/GYN:  Estrogens Passed - 09/30/2018  5:23 PM      Passed - Mammogram is up-to-date per Health Maintenance      Passed - Last BP in normal range    BP Readings from Last 1 Encounters:  09/30/18 110/68         Passed - Valid encounter within last 12 months    Recent Outpatient Visits          Today High risk medication use   Archivist at [place], MD   2 months ago Hyperglycemia   Archivist at [place], MD   3 months ago Urinary frequency   Archivist at [place], MD   4 months ago High risk medication use   Archivist at [place], MD   7 months ago Hypomagnesemia   Archivist at [place], MD      Future Appointments            In 2 months Mosie Lukes, MD [place] at [facility]          busPIRone (BUSPAR) 15 MG tablet 120 tablet 2    Sig: Take 1 tablet (15 mg total) by mouth 4 (four) times daily as needed.     Psychiatry: Anxiolytics/Hypnotics - Non-controlled Passed - 09/30/2018  5:23 PM      Passed - Valid encounter within last 6 months    Recent Outpatient Visits          Today High risk medication use   Archivist at [place], MD   2 months ago Hyperglycemia   Archivist at [place], MD   3 months ago Urinary frequency   Archivist at [place], MD   4 months ago High risk medication use   Archivist at [place], MD   7 months ago Hypomagnesemia   Archivist at [place], MD      Future Appointments            In 2 months Mosie Lukes, MD [place] at [place]  famotidine (PEPCID) 40 MG tablet 90 tablet 1    Sig: Take 1 tablet (40 mg total) by mouth daily.     Gastroenterology:  H2 Antagonists Passed - 09/30/2018  5:23 PM      Passed - Valid encounter within last 12 months    Recent Outpatient Visits          Today High risk medication use   Archivist at [place], MD   2 months ago Hyperglycemia   Archivist at [place], MD   3 months ago Urinary frequency   Archivist at [place], MD   4 months ago High risk medication use   Archivist at [place], MD   7 months ago Hypomagnesemia   Archivist at [place], MD      Future Appointments            In 2 months Mosie Lukes, MD [place] at [facility]          ondansetron (ZOFRAN) 8 MG tablet 90 tablet 1    Sig: TAKE 1/2 TO 1 TABLET BY MOUTH EVERY 8 HOURS AS NEEDED FOR NAUSEA AND VOMITING     Not Delegated - Gastroenterology: Antiemetics Failed - 09/30/2018  5:23 PM      Failed - This refill cannot be delegated      Passed - Valid encounter within last 6 months    Recent Outpatient Visits          Today High risk medication use   Archivist at [place], MD   2 months ago Hyperglycemia   Archivist at [place], MD   3 months ago Urinary frequency   [place] at [place], MD   4 months ago High risk medication use   Archivist at [place], MD   7 months ago Hypomagnesemia   Archivist at [place], MD      Future Appointments            In 2 months Mosie Lukes, MD [place] at [facility]          venlafaxine XR (EFFEXOR-XR) 150 MG 24 hr capsule 90 capsule 1    Sig: Take 1 capsule (150 mg total) by mouth daily with breakfast.     Psychiatry: Antidepressants - SNRI - desvenlafaxine & venlafaxine Failed - 09/30/2018  5:23 PM      Failed - LDL in normal range and within 360 days    LDL Cholesterol  Date Value Ref Range Status  02/12/2017 82 0 - 99 mg/dL Final         Failed  - Total Cholesterol in normal range and within 360 days    Cholesterol  Date Value Ref Range Status  05/06/2018 236 (H) 0 - 200 mg/dL Final    Comment:    ATP III Classification       Desirable:  < 200 mg/dL               Borderline High:  200 - 239 mg/dL  High:  > = 240 mg/dL         Failed - Triglycerides in normal range and within 360 days    Triglycerides  Date Value Ref Range Status  05/06/2018 322.0 (H) 0.0 - 149.0 mg/dL Final    Comment:    Normal:  <150 mg/dLBorderline High:  150 - 199 mg/dL         Passed - Completed PHQ-2 or PHQ-9 in the last 360 days.      Passed - Last BP in normal range    BP Readings from Last 1 Encounters:  09/30/18 110/68         Passed - Valid encounter within last 6 months    Recent Outpatient Visits          Today High risk medication use   Archivist at [place], MD   2 months ago Hyperglycemia   Archivist at [place], MD   3 months ago Urinary frequency   Archivist at [place], MD   4 months ago High risk medication use   Archivist at [place], MD   7 months ago Hypomagnesemia   Archivist at [place], MD      Future Appointments            In 2 months Mosie Lukes, MD [place] at [facility]          peginterferon alfa-2a (PEGASYS) 180 MCG/ML injection 4 mL 6    Sig: Inject 0.25 mLs (45 mcg total) into the skin every 7 (seven) days. Single use vials, after injection dispose of remaining vial content.     Off-Protocol Failed - 09/30/2018  5:23 PM      Failed - Medication not assigned to a protocol, review manually.      Passed - Valid encounter within last 12 months    Recent Outpatient Visits          Today High risk medication use   Arts development officer at [place], MD   2 months ago Hyperglycemia   Archivist at [place], MD   3 months ago Urinary frequency   Archivist at [place], MD   4 months ago High risk medication use   Archivist at [place], MD   7 months ago Hypomagnesemia   Archivist at [place], MD      Future Appointments            In 2 months Mosie Lukes, MD [place] at [place]

## 2018-09-30 NOTE — Assessment & Plan Note (Signed)
Following with hematology and they are starting her on PEG Interferon this week at the recommendation of a specialist at [facility]

## 2018-09-30 NOTE — Progress Notes (Signed)
Subjective:    Patient ID: Rachael Johnson, female    DOB: 1950/08/17, 68 y.o.   MRN: [id number]  No chief complaint on file.   HPI Patient is in today for follow up. She is sad and frustrated due to her worsening health and her Thrombocythemia is worsening s they are starting the PEG Interferon this week. She notes her anxiety is worsening. Notes some mild constipation also straining to move her bowels every 2-3 days. No black or bloody stool. Denies CP/palp/SOB/HA/congestion/fevers or GU c/o. Taking meds as prescribed  Past Medical History:  Diagnosis Date  . Acute renal insufficiency 04/09/2017  . Anemia, iron deficiency 07/08/2012  . Arthritis   . Cataract 12/21/2014  . Depression with anxiety 03/24/2011  . Diverticulitis   . Esophageal reflux 08/17/2013  . Essential thrombocythemia ([place]) 01/24/2011  . Frequent episodic tension-type headache   . Gastroenteritis 12/21/2014  . 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 GERD (gastroesophageal reflux disease)   . H. pylori infection 12/19/2012  . Hyperglycemia 12/17/2015  . Hypertension   . Iron deficiency anemia due to chronic blood loss 04/03/2017  . Osteoarthritis 05/24/2017  . Other and unspecified hyperlipidemia 02/25/2013  . Restless leg syndrome 09/30/2014  . Rhabdomyolysis 02/2017  . Scabies 03/28/2015  . 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 Seizure ([place])    childhood  . Shoulder wound, right, sequela 03/14/2017  . Thyroid disease     Past Surgical History:  Procedure Laterality Date  . ABDOMINAL HYSTERECTOMY     1992  . BREAST CYST ASPIRATION    . BREAST SURGERY  1992   biopsy, benign. Fibrocystic  . UMBILICAL HERNIA REPAIR N/A 09/25/2012   Procedure: HERNIA REPAIR UMBILICAL ADULT;  Surgeon: Harl Bowie, MD;  Location: [facility];  Service: General;  Laterality: N/A;    Family History  Problem Relation Age of Onset  . Arthritis Mother   . Cancer Mother        ovarian  . Hypertension Mother   . Heart disease Mother    pacer  . Heart failure Mother   . Arthritis Father   . Cancer Father        lung  . Hypertension Father   . Cancer Sister        lung  . Cancer Brother        prostate  . Cancer Brother        lung  . Hypertension Son   . COPD Brother   . Heart disease Brother        died from CHF  . Hypertension Brother   . Cancer Brother        colon  . Alcohol abuse Brother   . Cirrhosis Brother   . Seizures Sister   . Stroke Sister   . Arthritis Brother     Social History   Socioeconomic History  . Marital status: Widowed    Spouse name: Not on file  . Number of children: 2  . Years of education: Not on file  . Highest education level: Not on file  Occupational History  . Occupation: APL IT sales professional: [employer]    Comment: [employer]  . Financial resource strain: Not on file  . Food insecurity:    Worry: Not on file    Inability: Not on file  . Transportation needs:    Medical: Not on file    Non-medical: Not on file  Tobacco Use  . Smoking status:  Former Smoker    Packs/day: 3.50    Years: 20.00    Pack years: 70.00    Types: Cigarettes    Start date: 11/15/1970    Last attempt to quit: 07/31/1990    Years since quitting: 28.1  . Smokeless tobacco: Never Used  . Tobacco comment: quit 23 years ago  Substance and Sexual Activity  . Alcohol use: No    Alcohol/week: 0.0 standard drinks  . Drug use: No  . Sexual activity: Never  Lifestyle  . Physical activity:    Days per week: Not on file    Minutes per session: Not on file  . Stress: Not on file  Relationships  . Social connections:    Talks on phone: Not on file    Gets together: Not on file    Attends religious service: Not on file    Active member of club or organization: Not on file    Attends meetings of clubs or organizations: Not on file    Relationship status: Not on file  . Intimate partner violence:    Fear of current or ex partner: Not on file    Emotionally abused: Not on  file    Physically abused: Not on file    Forced sexual activity: Not on file  Other Topics Concern  . Not on file  Social History Narrative   Regular exercise: no   Caffeine Use: 2-3 weekly    Outpatient Medications Prior to Visit  Medication Sig Dispense Refill  . albuterol (VENTOLIN HFA) 108 (90 Base) MCG/ACT inhaler Inhale 2 puffs into the lungs every 6 (six) hours as needed for wheezing or shortness of breath. 54 Inhaler 1  . anagrelide (AGRYLIN) 1 MG capsule Take 1 mg by mouth 2 (two) times daily. Take 3 tablets, total of 3 mg twice a day.    Marland Kitchen aspirin EC 81 MG tablet Take 81 mg by mouth every morning.    . budesonide-formoterol (SYMBICORT) 80-4.5 MCG/ACT inhaler Inhale 2 puffs into the lungs 2 (two) times daily. 10.2 g 5  . cetirizine (ZYRTEC) 10 MG tablet Take 1 tablet (10 mg total) by mouth daily. 90 tablet 1  . chlorpheniramine (CHLOR-TRIMETON) 4 MG tablet Take 8 mg by mouth at bedtime.    . Cranberry 300 MG tablet Take 300 mg by mouth 2 (two) times daily.    . cyclobenzaprine (FLEXERIL) 10 MG tablet TAKE 1 TABLET BY MOUTH TWICE DAILY AS NEEDED FOR MUSCLE SPASM 60 tablet 0  . fluconazole (DIFLUCAN) 150 MG tablet Take 1 tablet (150 mg total) by mouth once a week. 2 tablet 1  . fluticasone (FLONASE) 50 MCG/ACT nasal spray Place 2 sprays into both nostrils daily. 48 g 3  . furosemide (LASIX) 20 MG tablet Take 1 tablet (20 mg total) by mouth 3 (three) times daily as needed for fluid or edema. 90 tablet 2  . gabapentin (NEURONTIN) 300 MG capsule TAKE 1 CAPSULE BY MOUTH AT BEDTIME 30 capsule 0  . JAKAFI 20 MG tablet TAKE 1 TABLET (20 MG TOTAL) BY MOUTH 2 TIMES DAILY. 60 tablet 4  . magic mouthwash SOLN Take 5 mLs by mouth 3 (three) times daily as needed for mouth pain. Components benadryl  525 mg, hydrocortisone 60 mg and nystatin 0.6 mg. 240 ml - Oral 240 mL 3  . meclizine (ANTIVERT) 12.5 MG tablet TAKE 1 TABLET BY MOUTH THREE TIMES DAILY AS NEEDED FOR DIZZINESS 40 tablet 0  .  montelukast (SINGULAIR) 10 MG tablet  Take 1 tablet (10 mg total) by mouth at bedtime. 90 tablet 2  . Multiple Vitamin (MULTIVITAMIN PO) Take 1 tablet by mouth every morning.     . nystatin (MYCOSTATIN) 100000 UNIT/ML suspension     . oxyCODONE-acetaminophen (PERCOCET) 10-325 MG tablet Take 1 tablet by mouth every 6 (six) hours as needed for pain. 120 tablet 0  . peginterferon alfa-2a (PEGASYS) 180 MCG/ML injection Inject 0.25 mLs (45 mcg total) into the skin every 7 (seven) days. 0.25 mL 6  . RESTASIS 0.05 % ophthalmic emulsion PLACE ONE DROP INTO EACH EYE EVERY 12 HOURS  3  . silver sulfADIAZINE (SILVADENE) 1 % cream Apply 1 application topically daily. 50 g 1  . ALPRAZolam (XANAX) 1 MG tablet TAKE 1/2 TO 1 (ONE-HALF TO ONE) TABLET BY MOUTH THREE TIMES DAILY AS NEEDED FOR ANXIETY 90 tablet 1  . atorvastatin (LIPITOR) 10 MG tablet Take 1 tablet (10 mg total) by mouth daily. 90 tablet 3  . busPIRone (BUSPAR) 10 MG tablet Take 1 tablet (10 mg total) by mouth 3 (three) times daily. 90 tablet 1  . estradiol (ESTRACE) 0.5 MG tablet Take 1 tablet (0.5 mg total) by mouth 2 (two) times daily. 180 tablet 1  . famotidine (PEPCID) 40 MG tablet Take 1 tablet (40 mg total) by mouth daily. 90 tablet 1  . levothyroxine (SYNTHROID, LEVOTHROID) 100 MCG tablet Take 1 tablet (100 mcg total) by mouth daily before breakfast. 30 tablet 3  . ondansetron (ZOFRAN) 8 MG tablet TAKE 1/2 TO 1 TABLET BY MOUTH EVERY 8 HOURS AS NEEDED FOR NAUSEA AND VOMITING 90 tablet 1  . venlafaxine XR (EFFEXOR-XR) 150 MG 24 hr capsule Take 1 capsule (150 mg total) by mouth daily with breakfast. 90 capsule 1   No facility-administered medications prior to visit.     No Known Allergies  Review of Systems  Constitutional: Positive for malaise/fatigue. Negative for fever.  HENT: Negative for congestion.   Eyes: Negative for blurred vision.  Respiratory: Negative for shortness of breath.   Cardiovascular: Negative for chest pain,  palpitations and leg swelling.  Gastrointestinal: Negative for abdominal pain, blood in stool and nausea.  Genitourinary: Negative for dysuria and frequency.  Musculoskeletal: Negative for falls.  Skin: Negative for rash.  Neurological: Negative for dizziness, loss of consciousness and headaches.  Endo/Heme/Allergies: Negative for environmental allergies.  Psychiatric/Behavioral: Positive for depression. Negative for hallucinations, substance abuse and suicidal ideas. The patient is nervous/anxious.        Objective:    Physical Exam Vitals signs and nursing note reviewed.  Constitutional:      General: She is not in acute distress.    Appearance: She is well-developed.  HENT:     Head: Normocephalic and atraumatic.     Nose: Nose normal.  Eyes:     General:        Right eye: No discharge.        Left eye: No discharge.  Neck:     Musculoskeletal: Normal range of motion and neck supple.  Cardiovascular:     Rate and Rhythm: Normal rate and regular rhythm.     Heart sounds: No murmur.  Pulmonary:     Effort: Pulmonary effort is normal.     Breath sounds: Normal breath sounds.  Abdominal:     General: Bowel sounds are normal.     Palpations: Abdomen is soft.     Tenderness: There is no abdominal tenderness.  Skin:    General: Skin is warm  and dry.  Neurological:     Mental Status: She is alert and oriented to person, place, and time.     BP 110/68 (BP Location: Left Arm, Patient Position: Sitting, Cuff Size: Normal)   Pulse 100   Temp (!) 97.2 F (36.2 C) (Oral)   Resp 18   Wt 159 lb (72.1 kg)   LMP 07/31/1990   SpO2 98%   BMI 28.17 kg/m  Wt Readings from Last 3 Encounters:  09/30/18 159 lb (72.1 kg)  09/27/18 162 lb (73.5 kg)  09/09/18 162 lb (73.5 kg)     Lab Results  Component Value Date   WBC 4.6 09/27/2018   HGB 10.4 (L) 09/27/2018   HCT 32.7 (L) 09/27/2018   PLT 691 (H) 09/27/2018   GLUCOSE 111 (H) 09/27/2018   CHOL 236 (H) 05/06/2018   TRIG  322.0 (H) 05/06/2018   HDL 59.00 05/06/2018   LDLDIRECT 95.0 05/06/2018   LDLCALC 82 02/12/2017   ALT 20 09/27/2018   AST 31 09/27/2018   NA 138 09/27/2018   K 3.8 09/27/2018   CL 102 09/27/2018   CREATININE 0.84 09/27/2018   BUN 15 09/27/2018   CO2 30 09/27/2018   TSH <0.080 (L) 09/27/2018   INR 0.94 09/16/2018   HGBA1C 5.0 08/10/2017    Lab Results  Component Value Date   TSH <0.080 (L) 09/27/2018   Lab Results  Component Value Date   WBC 4.6 09/27/2018   HGB 10.4 (L) 09/27/2018   HCT 32.7 (L) 09/27/2018   MCV 106.5 (H) 09/27/2018   PLT 691 (H) 09/27/2018   Lab Results  Component Value Date   NA 138 09/27/2018   K 3.8 09/27/2018   CHLORIDE 105 11/29/2016   CO2 30 09/27/2018   GLUCOSE 111 (H) 09/27/2018   BUN 15 09/27/2018   CREATININE 0.84 09/27/2018   BILITOT 0.3 09/27/2018   ALKPHOS 91 09/27/2018   AST 31 09/27/2018   ALT 20 09/27/2018   PROT 7.4 09/27/2018   ALBUMIN 4.3 09/27/2018   CALCIUM 9.1 09/27/2018   ANIONGAP 6 09/27/2018   EGFR 80 (L) 11/29/2016   GFR 62.50 05/06/2018   Lab Results  Component Value Date   CHOL 236 (H) 05/06/2018   Lab Results  Component Value Date   HDL 59.00 05/06/2018   Lab Results  Component Value Date   LDLCALC 82 02/12/2017   Lab Results  Component Value Date   TRIG 322.0 (H) 05/06/2018   Lab Results  Component Value Date   CHOLHDL 4 05/06/2018   Lab Results  Component Value Date   HGBA1C 5.0 08/10/2017       Assessment & Plan:   Problem List Items Addressed This Visit    Hypothyroid    Her TSH is suppressed but she is starting PEG Interferon this week and then she will likely need more Levothyroxine. She will skip one dose a week until she starts the interferon and then we wil lmonitor closely      Depression with anxiety    Very stressed due to worsening Thrombocytopenia. She is anxious about adding the PEG Interferon per a specialist at [facility]. Will increase Buspar 15 mg qid continue Venlafaxine and  Alprazolam      Relevant Medications   ALPRAZolam (XANAX) 1 MG tablet   venlafaxine XR (EFFEXOR-XR) 150 MG 24 hr capsule   busPIRone (BUSPAR) 15 MG tablet   Thrombocythemia (HCC)    Following with hematology and they are starting her on PEG Interferon  this week at the recommendation of a specialist at [facility]       Hyperglycemia    hgba1c acceptable, minimize simple carbs. Increase exercise as tolerated      Vitamin D deficiency    Supplement and mnitor      Hypomagnesemia    Supplement and monitor       Other Visit Diagnoses    High risk medication use    -  Primary   Relevant Orders   Pain Mgmt, Profile 8 w/Conf, U      I have discontinued Rachael Johnson's levothyroxine, busPIRone, and busPIRone. I am also having her start on busPIRone. Additionally, I am having her maintain her Multiple Vitamin (MULTIVITAMIN PO), aspirin EC, Cranberry, chlorpheniramine, RESTASIS, budesonide-formoterol, albuterol, magic mouthwash, nystatin, silver sulfADIAZINE, cetirizine, fluticasone, montelukast, fluconazole, gabapentin, furosemide, anagrelide, oxyCODONE-acetaminophen, JAKAFI, cyclobenzaprine, meclizine, peginterferon alfa-2a, ondansetron, ALPRAZolam, estradiol, famotidine, atorvastatin, and venlafaxine XR.  Meds ordered this encounter  Medications  . ondansetron (ZOFRAN) 8 MG tablet    Sig: TAKE 1/2 TO 1 TABLET BY MOUTH EVERY 8 HOURS AS NEEDED FOR NAUSEA AND VOMITING    Dispense:  90 tablet    Refill:  1    Please consider 90 day supplies to promote better adherence  . DISCONTD: busPIRone (BUSPAR) 10 MG tablet    Sig: Take 1 tablet (10 mg total) by mouth 3 (three) times daily.    Dispense:  90 tablet    Refill:  1  . ALPRAZolam (XANAX) 1 MG tablet    Sig: TAKE 1/2 TO 1 (ONE-HALF TO ONE) TABLET BY MOUTH THREE TIMES DAILY AS NEEDED FOR ANXIETY    Dispense:  90 tablet    Refill:  1  . estradiol (ESTRACE) 0.5 MG tablet    Sig: Take 1 tablet (0.5 mg total) by mouth 2 (two) times daily.     Dispense:  180 tablet    Refill:  1  . famotidine (PEPCID) 40 MG tablet    Sig: Take 1 tablet (40 mg total) by mouth daily.    Dispense:  90 tablet    Refill:  1  . atorvastatin (LIPITOR) 10 MG tablet    Sig: Take 1 tablet (10 mg total) by mouth daily.    Dispense:  90 tablet    Refill:  3  . venlafaxine XR (EFFEXOR-XR) 150 MG 24 hr capsule    Sig: Take 1 capsule (150 mg total) by mouth daily with breakfast.    Dispense:  90 capsule    Refill:  1  . busPIRone (BUSPAR) 15 MG tablet    Sig: Take 1 tablet (15 mg total) by mouth 4 (four) times daily as needed.    Dispense:  120 tablet    Refill:  2     Penni Homans, MD

## 2018-09-30 NOTE — Assessment & Plan Note (Signed)
Supplement and monitor 

## 2018-09-30 NOTE — Assessment & Plan Note (Signed)
Her TSH is suppressed but she is starting PEG Interferon this week and then she will likely need more Levothyroxine. She will skip one dose a week until she starts the interferon and then we wil lmonitor closely

## 2018-10-01 ENCOUNTER — Other Ambulatory Visit: Payer: Self-pay

## 2018-10-01 MED ORDER — VENLAFAXINE HCL ER 150 MG PO CP24: 150.0000 mg | ORAL_CAPSULE | Freq: Every day | ORAL | 1 refills | Status: DC

## 2018-10-01 MED ORDER — ATORVASTATIN CALCIUM 10 MG PO TABS: 10.0000 mg | ORAL_TABLET | Freq: Every day | ORAL | 3 refills | Status: DC

## 2018-10-01 MED ORDER — BUSPIRONE HCL 15 MG PO TABS: 15.0000 mg | ORAL_TABLET | Freq: Four times a day (QID) | ORAL | 2 refills | Status: DC | PRN

## 2018-10-01 MED ORDER — FAMOTIDINE 40 MG PO TABS: 40.0000 mg | ORAL_TABLET | Freq: Every day | ORAL | 1 refills | Status: DC

## 2018-10-01 MED ORDER — ALPRAZOLAM 1 MG PO TABS: ORAL_TABLET | ORAL | 1 refills | Status: DC

## 2018-10-01 MED ORDER — ONDANSETRON HCL 8 MG PO TABS: ORAL_TABLET | ORAL | 1 refills | Status: DC

## 2018-10-01 MED ORDER — ESTRADIOL 0.5 MG PO TABS: 0.5000 mg | ORAL_TABLET | Freq: Two times a day (BID) | ORAL | 1 refills | Status: DC

## 2018-10-01 MED ORDER — PEGINTERFERON ALFA-2A 180 MCG/ML ~~LOC~~ SOLN: 45.0000 ug | SUBCUTANEOUS | 6 refills | Status: DC

## 2018-10-01 NOTE — Telephone Encounter (Signed)
Please resend meds to the [facility] for patient

## 2018-10-02 ENCOUNTER — Telehealth: Payer: Self-pay | Admitting: *Deleted

## 2018-10-02 ENCOUNTER — Other Ambulatory Visit: Payer: Self-pay | Admitting: *Deleted

## 2018-10-02 DIAGNOSIS — D500 Iron deficiency anemia secondary to blood loss (chronic): Secondary | ICD-10-CM

## 2018-10-02 DIAGNOSIS — D473 Essential (hemorrhagic) thrombocythemia: Secondary | ICD-10-CM

## 2018-10-02 DIAGNOSIS — D7581 Myelofibrosis: Secondary | ICD-10-CM

## 2018-10-02 MED ORDER — "TUBERCULIN SYRINGE 27G X 1/2"" 1 ML MISC": 11 refills | Status: DC

## 2018-10-02 NOTE — Telephone Encounter (Signed)
Referral for [facility] faxed to [phone] to assist pt with Peginterferon self administered injections and teachings. Spoke with janet at the [place] office who advised Referral can be seen in Epic and must be faxed to the office as well. Call to [facility] to confirm referral was received and can be expedited. S/w Jasmine at [facility] call center office regarding pt referral. Fax has been received. [facility] will contact pt.  Attempt to reach pt regarding RN coming out to teach self injections. Unable to reach pt.

## 2018-10-02 NOTE — Telephone Encounter (Signed)
Patient called back.  Scheduled shipment of medication for 3/5, delivery 3/6 (with syringes).

## 2018-10-02 NOTE — Progress Notes (Signed)
Referral to Home health for pt teaching and administration of Peginterferon. Insurance has advised pt will need to administer injections at home as they will not pay for pt to have injections administered at clinic.

## 2018-10-02 NOTE — Telephone Encounter (Signed)
Oral Oncology Patient Advocate Encounter  Prior Authorization for Pegasys (Peg-Interferon) has been approved.    PA# [id number] Start date: 10/02/2018 End date: until further notice  Patients co-pay is $8.95  Oral Oncology Clinic will continue to follow.   Howe Patient [place] Phone [phone] Fax [phone] 10/02/2018 9:59 AM

## 2018-10-02 NOTE — Telephone Encounter (Signed)
Oral Oncology Patient Advocate Encounter  Received notification from [facility] that prior authorization for Pegasys (Peg-Interferon) is required.  PA submitted on CoverMyMeds Key [id number] Status is pending  Oral Oncology Clinic will continue to follow.

## 2018-10-02 NOTE — Telephone Encounter (Signed)
Oral Chemotherapy Pharmacist Encounter   Called patient on 10/02/18 and 10/03/18 in an attempt to provide patient education on the Pegasys, no answer, LVM for patient to call be back. Her can not sent out her medication until she returns our call.   Darl Pikes, PharmD, BCPS, [facility] Hematology/Oncology Clinical Pharmacist [facility] [phone]  10/03/2018 10:45 AM

## 2018-10-02 NOTE — Telephone Encounter (Signed)
Called and left patient a voicemail to call me back regarding new medication.

## 2018-10-03 ENCOUNTER — Other Ambulatory Visit: Payer: Self-pay | Admitting: Family Medicine

## 2018-10-04 ENCOUNTER — Telehealth: Payer: Self-pay | Admitting: *Deleted

## 2018-10-04 ENCOUNTER — Other Ambulatory Visit: Payer: Self-pay | Admitting: *Deleted

## 2018-10-04 ENCOUNTER — Encounter: Payer: Self-pay | Admitting: Hematology & Oncology

## 2018-10-04 DIAGNOSIS — D7581 Myelofibrosis: Secondary | ICD-10-CM | POA: Diagnosis not present

## 2018-10-04 DIAGNOSIS — D473 Essential (hemorrhagic) thrombocythemia: Secondary | ICD-10-CM | POA: Diagnosis not present

## 2018-10-04 DIAGNOSIS — D509 Iron deficiency anemia, unspecified: Secondary | ICD-10-CM | POA: Diagnosis not present

## 2018-10-04 MED FILL — BD TB SYRINGE 27GX1/2": 27G X 1/2" | 28 days supply | Qty: 4 | Fill #0

## 2018-10-04 MED FILL — BD TB SYRINGE 27GX1/2: 27G X 1/2" | 28 days supply | Qty: 4 | Fill #0

## 2018-10-04 MED FILL — PEGASYS 180 MCG/ML VIAL: 180 | 28 days supply | Qty: 4 | Fill #0

## 2018-10-04 NOTE — Telephone Encounter (Signed)
Called [facility] to f/u about pt teaching/injection administration for peg interferon. [facility] advised RN is scheduled to go out to see pt today.

## 2018-10-04 NOTE — Telephone Encounter (Signed)
Spoke to patient yesterday at 430pm.  I reiterated that the pharmacist must speak with her so we can ship out her medication.  She said that she would call in Friday morning to speak with the her.

## 2018-10-07 NOTE — Telephone Encounter (Signed)
Patient spoke with pharmacist on Friday and medication has been scheduled to be shipped out 3/9 for delivery 3/10.

## 2018-10-11 ENCOUNTER — Other Ambulatory Visit: Payer: Self-pay | Admitting: Family Medicine

## 2018-10-11 ENCOUNTER — Encounter: Payer: Self-pay | Admitting: Family Medicine

## 2018-10-11 DIAGNOSIS — M549 Dorsalgia, unspecified: Principal | ICD-10-CM

## 2018-10-11 DIAGNOSIS — G8929 Other chronic pain: Secondary | ICD-10-CM

## 2018-10-11 NOTE — Telephone Encounter (Signed)
Copied from [place] [id number]. Topic: Quick Communication - Rx Refill/Question >> Oct 11, 2018  3:58 PM Ahmed Prima L wrote: Medication: oxyCODONE-acetaminophen (PERCOCET) 10-325 MG tablet  Has the patient contacted their pharmacy? No (Agent: If no, request that the patient contact the pharmacy for the refill.) (Agent: If yes, when and what did the pharmacy advise?)  Preferred Pharmacy (with phone number or street name): [facility] [address], [place] - [address] [place] [address] Phone: [phone] Fax: [phone]    Agent: Please be advised that RX refills may take up to 3 business days. We ask that you follow-up with your pharmacy.

## 2018-10-12 ENCOUNTER — Encounter: Payer: Self-pay | Admitting: Family Medicine

## 2018-10-12 ENCOUNTER — Other Ambulatory Visit: Payer: Self-pay | Admitting: Family Medicine

## 2018-10-12 DIAGNOSIS — D509 Iron deficiency anemia, unspecified: Secondary | ICD-10-CM | POA: Diagnosis not present

## 2018-10-12 DIAGNOSIS — D7581 Myelofibrosis: Secondary | ICD-10-CM | POA: Diagnosis not present

## 2018-10-12 DIAGNOSIS — D473 Essential (hemorrhagic) thrombocythemia: Secondary | ICD-10-CM | POA: Diagnosis not present

## 2018-10-13 ENCOUNTER — Encounter: Payer: Self-pay | Admitting: Family Medicine

## 2018-10-13 MED ORDER — OXYCODONE-ACETAMINOPHEN 10-325 MG PO TABS: 1.0000 | ORAL_TABLET | Freq: Four times a day (QID) | ORAL | 0 refills | Status: DC | PRN

## 2018-10-14 MED ORDER — ALBUTEROL SULFATE HFA 108 (90 BASE) MCG/ACT IN AERS: INHALATION_SPRAY | RESPIRATORY_TRACT | 5 refills | Status: DC

## 2018-10-19 DIAGNOSIS — D509 Iron deficiency anemia, unspecified: Secondary | ICD-10-CM | POA: Diagnosis not present

## 2018-10-19 DIAGNOSIS — D473 Essential (hemorrhagic) thrombocythemia: Secondary | ICD-10-CM | POA: Diagnosis not present

## 2018-10-19 DIAGNOSIS — D7581 Myelofibrosis: Secondary | ICD-10-CM | POA: Diagnosis not present

## 2018-10-21 ENCOUNTER — Inpatient Hospital Stay: Payer: Medicare Other

## 2018-10-21 ENCOUNTER — Inpatient Hospital Stay: Payer: Medicare Other | Attending: Hematology & Oncology | Admitting: Hematology & Oncology

## 2018-10-21 ENCOUNTER — Other Ambulatory Visit: Payer: Self-pay | Admitting: Family Medicine

## 2018-10-21 ENCOUNTER — Other Ambulatory Visit: Payer: Self-pay

## 2018-10-21 ENCOUNTER — Encounter: Payer: Self-pay | Admitting: Hematology & Oncology

## 2018-10-21 VITALS — BP 132/85 | HR 93 | Temp 98.3°F | Resp 16 | Wt 161.0 lb

## 2018-10-21 DIAGNOSIS — G8929 Other chronic pain: Secondary | ICD-10-CM

## 2018-10-21 DIAGNOSIS — Z79899 Other long term (current) drug therapy: Secondary | ICD-10-CM | POA: Insufficient documentation

## 2018-10-21 DIAGNOSIS — Z7982 Long term (current) use of aspirin: Secondary | ICD-10-CM | POA: Insufficient documentation

## 2018-10-21 DIAGNOSIS — D473 Essential (hemorrhagic) thrombocythemia: Secondary | ICD-10-CM | POA: Insufficient documentation

## 2018-10-21 DIAGNOSIS — E039 Hypothyroidism, unspecified: Secondary | ICD-10-CM | POA: Diagnosis not present

## 2018-10-21 DIAGNOSIS — E559 Vitamin D deficiency, unspecified: Secondary | ICD-10-CM

## 2018-10-21 DIAGNOSIS — I10 Essential (primary) hypertension: Secondary | ICD-10-CM

## 2018-10-21 DIAGNOSIS — R300 Dysuria: Secondary | ICD-10-CM

## 2018-10-21 DIAGNOSIS — M549 Dorsalgia, unspecified: Principal | ICD-10-CM

## 2018-10-21 DIAGNOSIS — D7581 Myelofibrosis: Secondary | ICD-10-CM

## 2018-10-21 DIAGNOSIS — D75839 Thrombocytosis, unspecified: Secondary | ICD-10-CM

## 2018-10-21 DIAGNOSIS — R739 Hyperglycemia, unspecified: Secondary | ICD-10-CM

## 2018-10-21 LAB — CMP (CANCER CENTER ONLY)
ALT: 26 U/L (ref 0–44)
AST: 32 U/L (ref 15–41)
Albumin: 4.4 g/dL (ref 3.5–5.0)
Alkaline Phosphatase: 91 U/L (ref 38–126)
Anion gap: 8 (ref 5–15)
BUN: 9 mg/dL (ref 8–23)
CO2: 28 mmol/L (ref 22–32)
Calcium: 8.7 mg/dL — ABNORMAL LOW (ref 8.9–10.3)
Chloride: 103 mmol/L (ref 98–111)
Creatinine: 0.88 mg/dL (ref 0.44–1.00)
GFR, Est AFR Am: >60 mL/min (ref >60)
GFR, Estimated: >60 mL/min (ref >60)
Glucose, Bld: 123 mg/dL — ABNORMAL HIGH (ref 70–99)
Potassium: 3.8 mmol/L (ref 3.5–5.1)
Sodium: 139 mmol/L (ref 135–145)
Total Bilirubin: 0.3 mg/dL (ref 0.3–1.2)
Total Protein: 7.3 g/dL (ref 6.5–8.1)

## 2018-10-21 LAB — CBC WITH DIFFERENTIAL (CANCER CENTER ONLY)
Abs Immature Granulocytes: 0.04 10*3/uL (ref 0.00–0.07)
Basophils Absolute: 0 10*3/uL (ref 0.0–0.1)
Basophils Relative: 1 %
Eosinophils Absolute: 0.2 10*3/uL (ref 0.0–0.5)
Eosinophils Relative: 6 %
HCT: 34.7 % — ABNORMAL LOW (ref 36.0–46.0)
Hemoglobin: 11 g/dL — ABNORMAL LOW (ref 12.0–15.0)
Immature Granulocytes: 1 %
Lymphocytes Relative: 24 %
Lymphs Abs: 0.9 10*3/uL (ref 0.7–4.0)
MCH: 33.5 pg (ref 26.0–34.0)
MCHC: 31.7 g/dL (ref 30.0–36.0)
MCV: 105.8 fL — ABNORMAL HIGH (ref 80.0–100.0)
Monocytes Absolute: 0.4 10*3/uL (ref 0.1–1.0)
Monocytes Relative: 10 %
Neutro Abs: 2.2 10*3/uL (ref 1.7–7.7)
Neutrophils Relative %: 58 %
Platelet Count: 756 10*3/uL — ABNORMAL HIGH (ref 150–400)
RBC: 3.28 MIL/uL — ABNORMAL LOW (ref 3.87–5.11)
RDW: 14.8 % (ref 11.5–15.5)
WBC Count: 3.8 10*3/uL — ABNORMAL LOW (ref 4.0–10.5)
nRBC: 0 % (ref 0.0–0.2)

## 2018-10-21 LAB — RETICULOCYTES
Immature Retic Fract: 23.4 % — ABNORMAL HIGH (ref 2.3–15.9)
RBC.: 3.24 MIL/uL — ABNORMAL LOW (ref 3.87–5.11)
Retic Count, Absolute: 60.3 10*3/uL (ref 19.0–186.0)
Retic Ct Pct: 1.9 % (ref 0.4–3.1)

## 2018-10-21 LAB — LACTATE DEHYDROGENASE: LDH: 368 U/L — ABNORMAL HIGH (ref 98–192)

## 2018-10-21 MED ORDER — GRANISETRON 3.1 MG/24HR TD PTCH: MEDICATED_PATCH | TRANSDERMAL | 4 refills | Status: DC

## 2018-10-21 NOTE — Progress Notes (Signed)
Hematology and Oncology Follow Up Visit  Rachael Johnson [id number] 1951/01/11 68 y.o. 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 Doerner is back for follow-up.  She is now started the PEG interferon.  We have her on the lowest dose.  She is having a little bit of a tough time with this.  She just feels achy.  She does not have a lot of energy.  Her appetite is still doing okay.  Her platelet count I think for the most part is holding steady.  Hopefully this is a good sign.  She has only had 2 doses of the PEG-interferon.  There is been no rashes.  She has had no fever..  She does have quite a bit of nausea.  I will try her on a Sancuso patch.  Maybe this will help her.  There is been no diarrhea.  She has had no leg swelling.  She has had no obvious fever.  She said there is no flushing.  Overall, I would say her performance status is ECOG 1.    Medications:  Allergies as of 10/21/2018   No Known Allergies     Medication List       Accurate as of October 21, 2018 11:57 AM. Always use your most recent med list.        albuterol 108 (90 Base) MCG/ACT inhaler Commonly known as:  PROVENTIL HFA;VENTOLIN HFA INHALE 2 PUFFS BY MOUTH EVERY 6 HOURS AS NEEDED FOR WHEEZING OR SHORTNESS OF BREATH   albuterol 108 (90 Base) MCG/ACT inhaler Commonly known as:  PROVENTIL HFA;VENTOLIN HFA INHALE 2 PUFFS BY MOUTH EVERY 6 HOURS AS NEEDED FOR WHEEZING OR SHORTNESS OF BREATH   ALPRAZolam 1 MG tablet Commonly known as:  XANAX TAKE 1/2 TO 1 (ONE-HALF TO ONE) TABLET BY MOUTH THREE TIMES DAILY AS NEEDED FOR ANXIETY    aspirin EC 81 MG tablet Take 81 mg by mouth every morning.   atorvastatin 10 MG tablet Commonly known as:  LIPITOR Take 1 tablet (10 mg total) by mouth daily.   budesonide-formoterol 80-4.5 MCG/ACT inhaler Commonly known as:  Symbicort Inhale 2 puffs into the lungs 2 (two) times daily.   busPIRone 15 MG tablet Commonly known as:  BUSPAR Take 1 tablet (15 mg total) by mouth 4 (four) times daily as needed.   cetirizine 10 MG tablet Commonly known as:  ZYRTEC Take 1 tablet (10 mg total) by mouth daily.   chlorpheniramine 4 MG tablet Commonly known as:  CHLOR-TRIMETON Take 8 mg by mouth at bedtime.   Cranberry 300 MG tablet Take 300 mg by mouth 2 (two) times daily.   cyclobenzaprine 10 MG tablet Commonly known as:  FLEXERIL TAKE 1 TABLET BY MOUTH TWICE DAILY AS NEEDED FOR MUSCLE SPASM   estradiol 0.5 MG tablet Commonly known as:  ESTRACE Take 1 tablet (0.5 mg total) by mouth 2 (two) times daily.   famotidine 40 MG tablet Commonly known as:  PEPCID Take 1 tablet (40 mg total) by mouth daily.   fluconazole 150 MG tablet Commonly known as:  DIFLUCAN Take 1 tablet (150 mg total) by  mouth once a week.   fluticasone 50 MCG/ACT nasal spray Commonly known as:  FLONASE Place 2 sprays into both nostrils daily.   furosemide 20 MG tablet Commonly known as:  LASIX Take 1 tablet (20 mg total) by mouth 3 (three) times daily as needed for fluid or edema.   gabapentin 300 MG capsule Commonly known as:  NEURONTIN TAKE 1 CAPSULE BY MOUTH AT BEDTIME   magic mouthwash Soln Take 5 mLs by mouth 3 (three) times daily as needed for mouth pain. Components benadryl  525 mg, hydrocortisone 60 mg and nystatin 0.6 mg. 240 ml - Oral   meclizine 12.5 MG tablet Commonly known as:  ANTIVERT TAKE 1 TABLET BY MOUTH THREE TIMES DAILY AS NEEDED FOR DIZZINESS   montelukast 10 MG tablet Commonly known as:  SINGULAIR Take 1 tablet (10 mg total) by mouth at bedtime.   MULTIVITAMIN PO Take 1  tablet by mouth every morning.   nystatin 100000 UNIT/ML suspension Commonly known as:  MYCOSTATIN   ondansetron 8 MG tablet Commonly known as:  ZOFRAN TAKE 1/2 TO 1 TABLET BY MOUTH EVERY 8 HOURS AS NEEDED FOR NAUSEA AND VOMITING   oxyCODONE-acetaminophen 10-325 MG tablet Commonly known as:  PERCOCET Take 1 tablet by mouth every 6 (six) hours as needed for pain.   peginterferon alfa-2a 180 MCG/ML injection Commonly known as:  PEGASYS Inject 45 mcg into the skin every 7 (seven) days.   Restasis 0.05 % ophthalmic emulsion Generic drug:  cycloSPORINE PLACE ONE DROP INTO EACH EYE EVERY 12 HOURS   silver sulfADIAZINE 1 % cream Commonly known as:  SILVADENE Apply 1 application topically daily.   TUBERCULIN SYR 1CC/27GX1/2" 27G X 1/2" 1 ML Misc Use as directed once weekly for peg-interferon injections   venlafaxine XR 150 MG 24 hr capsule Commonly known as:  EFFEXOR-XR Take 1 capsule (150 mg total) by mouth daily with breakfast.       Allergies: No Known Allergies  Past Medical History, Surgical history, Social history, and Family History were reviewed and updated.  Review of Systems: Review of Systems  Constitutional: Positive for diaphoresis and malaise/fatigue.  HENT: Negative.   Eyes: Negative.   Respiratory: Positive for shortness of breath.   Cardiovascular: Negative.   Gastrointestinal: Positive for abdominal pain, heartburn and nausea.  Genitourinary: Negative.   Musculoskeletal: Positive for joint pain and myalgias.  Skin: Negative.   Neurological: Positive for dizziness.  Endo/Heme/Allergies: Negative.   Psychiatric/Behavioral: Negative.      Physical Exam:  weight is 161 lb (73 kg). Her oral temperature is 98.3 F (36.8 C). Her blood pressure is 132/85 and her pulse is 93. Her respiration is 16 and oxygen saturation is 97%.   Wt Readings from Last 3 Encounters:  10/21/18 161 lb (73 kg)  09/30/18 159 lb (72.1 kg)  09/27/18 162 lb (73.5 kg)     Physical Exam Vitals signs reviewed.  HENT:     Head: Normocephalic and atraumatic.  Eyes:     Pupils: Pupils are equal, round, and reactive to light.  Neck:     Musculoskeletal: Normal range of motion.  Cardiovascular:     Rate and Rhythm: Normal rate and regular rhythm.     Heart sounds: Normal heart sounds.  Pulmonary:     Effort: Pulmonary effort is normal.     Breath sounds: Normal breath sounds.  Abdominal:     General: Bowel sounds are normal.     Palpations: Abdomen is soft.  Comments: Her spleen tip is palpable a couple centimeters below the left costal margin.  Musculoskeletal: Normal range of motion.        General: No tenderness or deformity.  Lymphadenopathy:     Cervical: No cervical adenopathy.  Skin:    General: Skin is warm and dry.     Findings: No erythema or rash.  Neurological:     Mental Status: She is alert and oriented to person, place, and time.  Psychiatric:        Behavior: Behavior normal.        Thought Content: Thought content normal.        Judgment: Judgment normal.      Lab Results  Component Value Date   WBC 3.8 (L) 10/21/2018   HGB 11.0 (L) 10/21/2018   HCT 34.7 (L) 10/21/2018   MCV 105.8 (H) 10/21/2018   PLT 756 (H) 10/21/2018   Lab Results  Component Value Date   FERRITIN 1,833 (H) 09/27/2018   IRON 174 (H) 09/27/2018   TIBC 193 (L) 09/27/2018   UIBC 19 (L) 09/27/2018   IRONPCTSAT 90 (H) 09/27/2018   Lab Results  Component Value Date   RETICCTPCT 1.9 10/21/2018   RBC 3.24 (L) 10/21/2018   RETICCTABS 45.8 06/02/2015   No results found for: KPAFRELGTCHN, LAMBDASER, KAPLAMBRATIO No results found for: IGGSERUM, IGA, IGMSERUM No results found for: Odetta Pink, SPEI   Chemistry      Component Value Date/Time   NA 139 10/21/2018 1026   NA 143 07/16/2017 1057   NA 140 11/29/2016 0936   K 3.8 10/21/2018 1026   K 3.2 (L) 07/16/2017 1057   K 4.2 11/29/2016 0936    CL 103 10/21/2018 1026   CL 104 07/16/2017 1057   CO2 28 10/21/2018 1026   CO2 25 07/16/2017 1057   CO2 27 11/29/2016 0936   BUN 9 10/21/2018 1026   BUN 10 07/16/2017 1057   BUN 18.3 11/29/2016 0936   CREATININE 0.88 10/21/2018 1026   CREATININE 0.8 07/16/2017 1057   CREATININE 0.8 11/29/2016 0936      Component Value Date/Time   CALCIUM 8.7 (L) 10/21/2018 1026   CALCIUM 8.9 07/16/2017 1057   CALCIUM 8.9 11/29/2016 0936   ALKPHOS 91 10/21/2018 1026   ALKPHOS 71 07/16/2017 1057   ALKPHOS 77 11/29/2016 0936   AST 32 10/21/2018 1026   AST 19 11/29/2016 0936   ALT 26 10/21/2018 1026   ALT 27 07/16/2017 1057   ALT 10 11/29/2016 0936   BILITOT 0.3 10/21/2018 1026   BILITOT <0.22 11/29/2016 0936      Impression and Plan: Ms. Filosa is a very pleasant 68 yo caucasian female with essential thrombocythemia.   I know that we are still early on with the PEG interferon.  We can definitely increase the dose if necessary.  Again she is on a low dose.  I want to see her back in 3 weeks.  I really hope and I am praying that her platelet count will be coming down at that time.  If it is, then we will continue her on the same dose.  If not, then I will increase the dose up to 90 mcg weekly.  Apparently, she has a very nice nurse who comes out to do her injections.  I am grateful that [facility] is helping her out.         [name], MD 3/23/202011:57 AM

## 2018-10-22 ENCOUNTER — Encounter: Payer: Self-pay | Admitting: Hematology & Oncology

## 2018-10-22 LAB — IRON AND TIBC
Iron: 90 ug/dL (ref 41–142)
Saturation Ratios: 46 % (ref 21–57)
TIBC: 195 ug/dL — ABNORMAL LOW (ref 236–444)
UIBC: 105 ug/dL — ABNORMAL LOW (ref 120–384)

## 2018-10-22 LAB — FERRITIN: Ferritin: 2054 ng/mL — ABNORMAL HIGH (ref 11–307)

## 2018-10-23 ENCOUNTER — Other Ambulatory Visit: Payer: Self-pay | Admitting: *Deleted

## 2018-10-23 ENCOUNTER — Encounter: Payer: Self-pay | Admitting: Hematology & Oncology

## 2018-10-23 MED ORDER — GRANISETRON 3.1 MG/24HR TD PTCH: MEDICATED_PATCH | TRANSDERMAL | 4 refills | Status: DC

## 2018-10-24 ENCOUNTER — Encounter: Payer: Self-pay | Admitting: Hematology & Oncology

## 2018-10-26 DIAGNOSIS — D7581 Myelofibrosis: Secondary | ICD-10-CM | POA: Diagnosis not present

## 2018-10-26 DIAGNOSIS — D509 Iron deficiency anemia, unspecified: Secondary | ICD-10-CM | POA: Diagnosis not present

## 2018-10-26 DIAGNOSIS — D473 Essential (hemorrhagic) thrombocythemia: Secondary | ICD-10-CM | POA: Diagnosis not present

## 2018-10-28 ENCOUNTER — Telehealth: Payer: Self-pay | Admitting: *Deleted

## 2018-10-28 NOTE — Telephone Encounter (Signed)
Spoke with pt regarding injections. Pt sates I have received 3 and will need more. Pt advised Medication Pegasys came via [employer] and she was assisted by Clearnce Sorrel our pharmacist. Hanley Seamen to Alyson's phone # requested pt call her as she has attempted to reach her multiple times.  Called lmovm for Alyson regarding above.

## 2018-10-29 ENCOUNTER — Telehealth: Payer: Self-pay | Admitting: *Deleted

## 2018-10-29 NOTE — Telephone Encounter (Signed)
Oral Chemotherapy Pharmacist Encounter    Spoke with Rachael Johnson on 10/04/2018 to review dosing, administration, and side effects for peginterferon alfa-2a (PEGASYS).   Darl Pikes, PharmD, BCPS, [facility]/Oncology Clinical Pharmacist [facility] [phone]  10/29/2018 9:47 AM

## 2018-10-29 NOTE — Telephone Encounter (Signed)
Call from [facility] regarding Pt medication refill Pt is to call [facility] pharmacy for refill.  Called pt notified pt of above instructions. Gave pt # to call. Pt verbalized understanding.

## 2018-10-30 MED FILL — PEGASYS 180 MCG/ML VIAL: 180 | 28 days supply | Qty: 4 | Fill #1

## 2018-10-30 MED FILL — BD TB SYRINGE 27GX1/2: 27G X 1/2" | 28 days supply | Qty: 4 | Fill #1

## 2018-10-30 MED FILL — BD TB SYRINGE 27GX1/2": 27G X 1/2" | 28 days supply | Qty: 4 | Fill #1

## 2018-11-02 DIAGNOSIS — D473 Essential (hemorrhagic) thrombocythemia: Secondary | ICD-10-CM | POA: Diagnosis not present

## 2018-11-02 DIAGNOSIS — D509 Iron deficiency anemia, unspecified: Secondary | ICD-10-CM | POA: Diagnosis not present

## 2018-11-02 DIAGNOSIS — D7581 Myelofibrosis: Secondary | ICD-10-CM | POA: Diagnosis not present

## 2018-11-03 ENCOUNTER — Other Ambulatory Visit: Payer: Self-pay | Admitting: Family Medicine

## 2018-11-03 DIAGNOSIS — D509 Iron deficiency anemia, unspecified: Secondary | ICD-10-CM | POA: Diagnosis not present

## 2018-11-03 DIAGNOSIS — G8929 Other chronic pain: Secondary | ICD-10-CM

## 2018-11-03 DIAGNOSIS — M549 Dorsalgia, unspecified: Principal | ICD-10-CM

## 2018-11-03 DIAGNOSIS — D473 Essential (hemorrhagic) thrombocythemia: Secondary | ICD-10-CM | POA: Diagnosis not present

## 2018-11-03 DIAGNOSIS — D7581 Myelofibrosis: Secondary | ICD-10-CM | POA: Diagnosis not present

## 2018-11-06 ENCOUNTER — Other Ambulatory Visit: Payer: Self-pay | Admitting: Family Medicine

## 2018-11-06 DIAGNOSIS — G8929 Other chronic pain: Secondary | ICD-10-CM

## 2018-11-06 DIAGNOSIS — D500 Iron deficiency anemia secondary to blood loss (chronic): Secondary | ICD-10-CM

## 2018-11-06 DIAGNOSIS — M549 Dorsalgia, unspecified: Principal | ICD-10-CM

## 2018-11-06 DIAGNOSIS — D473 Essential (hemorrhagic) thrombocythemia: Secondary | ICD-10-CM

## 2018-11-06 NOTE — Telephone Encounter (Signed)
Copied from [place] [phone] [phone]. Topic: Quick Communication - Rx Refill/Question >> Nov 06, 2018  4:16 PM Oneta Rack wrote: Medication: chlorpheniramine (CHLOR-TRIMETON) 4 MG tablet, RESTASIS 0.05 % ophthalmic emulsion , oxyCODONE-acetaminophen (PERCOCET) 10-325 MG tablet (patient states she know's she not due but the pharmacy is slow)   Preferred Pharmacy (with phone number or street name):  [place], [address] [phone] (Phone)  [phone] (Fax)  Agent: Please be advised that RX refills may take up to 3 business days. We ask that you follow-up with your pharmacy.

## 2018-11-07 ENCOUNTER — Telehealth: Payer: Self-pay

## 2018-11-07 DIAGNOSIS — G8929 Other chronic pain: Secondary | ICD-10-CM

## 2018-11-07 DIAGNOSIS — M549 Dorsalgia, unspecified: Principal | ICD-10-CM

## 2018-11-07 MED ORDER — CHLORPHENIRAMINE MALEATE 4 MG PO TABS: 8.0000 mg | ORAL_TABLET | Freq: Every evening | ORAL | 1 refills | Status: DC | PRN

## 2018-11-07 MED ORDER — RESTASIS 0.05 % OP EMUL: OPHTHALMIC | 3 refills | Status: DC

## 2018-11-07 MED ORDER — OXYCODONE-ACETAMINOPHEN 10-325 MG PO TABS: 1.0000 | ORAL_TABLET | Freq: Four times a day (QID) | ORAL | 0 refills | Status: DC | PRN

## 2018-11-07 MED ORDER — CYCLOBENZAPRINE HCL 10 MG PO TABS: ORAL_TABLET | ORAL | 0 refills | Status: DC

## 2018-11-07 NOTE — Telephone Encounter (Signed)
Rx sent 

## 2018-11-07 NOTE — Telephone Encounter (Signed)
Copied from [place] [id number]. Topic: General - Other >> Nov 07, 2018 12:59 PM Mcneil, Ja-Kwan wrote: Reason for CRM: Pt stated she contacted [facility] regarding the Rx for cyclobenzaprine (FLEXERIL) 10 MG tablet but she was told that no Rx was received. Pt asked that the Rx be resent to [facility] in [place].

## 2018-11-09 DIAGNOSIS — D473 Essential (hemorrhagic) thrombocythemia: Secondary | ICD-10-CM | POA: Diagnosis not present

## 2018-11-09 DIAGNOSIS — D509 Iron deficiency anemia, unspecified: Secondary | ICD-10-CM | POA: Diagnosis not present

## 2018-11-09 DIAGNOSIS — D7581 Myelofibrosis: Secondary | ICD-10-CM | POA: Diagnosis not present

## 2018-11-10 ENCOUNTER — Other Ambulatory Visit: Payer: Self-pay | Admitting: Family Medicine

## 2018-11-10 DIAGNOSIS — M549 Dorsalgia, unspecified: Principal | ICD-10-CM

## 2018-11-10 DIAGNOSIS — G8929 Other chronic pain: Secondary | ICD-10-CM

## 2018-11-11 ENCOUNTER — Inpatient Hospital Stay: Payer: Medicare Other | Attending: Hematology & Oncology | Admitting: Hematology & Oncology

## 2018-11-11 ENCOUNTER — Other Ambulatory Visit: Payer: Self-pay

## 2018-11-11 ENCOUNTER — Encounter: Payer: Self-pay | Admitting: Hematology & Oncology

## 2018-11-11 ENCOUNTER — Inpatient Hospital Stay: Payer: Medicare Other

## 2018-11-11 VITALS — BP 124/86 | HR 87 | Temp 98.3°F | Resp 18 | Wt 162.0 lb

## 2018-11-11 DIAGNOSIS — D7581 Myelofibrosis: Secondary | ICD-10-CM | POA: Diagnosis not present

## 2018-11-11 DIAGNOSIS — D473 Essential (hemorrhagic) thrombocythemia: Secondary | ICD-10-CM | POA: Insufficient documentation

## 2018-11-11 DIAGNOSIS — Z7982 Long term (current) use of aspirin: Secondary | ICD-10-CM | POA: Diagnosis not present

## 2018-11-11 DIAGNOSIS — R5383 Other fatigue: Secondary | ICD-10-CM

## 2018-11-11 DIAGNOSIS — E039 Hypothyroidism, unspecified: Secondary | ICD-10-CM | POA: Diagnosis not present

## 2018-11-11 DIAGNOSIS — D75839 Thrombocytosis, unspecified: Secondary | ICD-10-CM

## 2018-11-11 DIAGNOSIS — D509 Iron deficiency anemia, unspecified: Secondary | ICD-10-CM | POA: Insufficient documentation

## 2018-11-11 DIAGNOSIS — R110 Nausea: Secondary | ICD-10-CM | POA: Insufficient documentation

## 2018-11-11 LAB — CBC WITH DIFFERENTIAL (CANCER CENTER ONLY)
Abs Immature Granulocytes: 0.03 10*3/uL (ref 0.00–0.07)
Basophils Absolute: 0 10*3/uL (ref 0.0–0.1)
Basophils Relative: 1 %
Eosinophils Absolute: 0.2 10*3/uL (ref 0.0–0.5)
Eosinophils Relative: 5 %
HCT: 33.6 % — ABNORMAL LOW (ref 36.0–46.0)
Hemoglobin: 10.7 g/dL — ABNORMAL LOW (ref 12.0–15.0)
Immature Granulocytes: 1 %
Lymphocytes Relative: 30 %
Lymphs Abs: 1.1 10*3/uL (ref 0.7–4.0)
MCH: 33.5 pg (ref 26.0–34.0)
MCHC: 31.8 g/dL (ref 30.0–36.0)
MCV: 105.3 fL — ABNORMAL HIGH (ref 80.0–100.0)
Monocytes Absolute: 0.5 10*3/uL (ref 0.1–1.0)
Monocytes Relative: 13 %
Neutro Abs: 1.9 10*3/uL (ref 1.7–7.7)
Neutrophils Relative %: 50 %
Platelet Count: 658 10*3/uL — ABNORMAL HIGH (ref 150–400)
RBC: 3.19 MIL/uL — ABNORMAL LOW (ref 3.87–5.11)
RDW: 15 % (ref 11.5–15.5)
WBC Count: 3.7 10*3/uL — ABNORMAL LOW (ref 4.0–10.5)
nRBC: 0.5 % — ABNORMAL HIGH (ref 0.0–0.2)

## 2018-11-11 LAB — CMP (CANCER CENTER ONLY)
ALT: 28 U/L (ref 0–44)
AST: 43 U/L — ABNORMAL HIGH (ref 15–41)
Albumin: 4.1 g/dL (ref 3.5–5.0)
Alkaline Phosphatase: 100 U/L (ref 38–126)
Anion gap: 6 (ref 5–15)
BUN: 13 mg/dL (ref 8–23)
CO2: 31 mmol/L (ref 22–32)
Calcium: 8.7 mg/dL — ABNORMAL LOW (ref 8.9–10.3)
Chloride: 102 mmol/L (ref 98–111)
Creatinine: 0.84 mg/dL (ref 0.44–1.00)
GFR, Est AFR Am: >60 mL/min (ref >60)
GFR, Estimated: >60 mL/min (ref >60)
Glucose, Bld: 92 mg/dL (ref 70–99)
Potassium: 3.8 mmol/L (ref 3.5–5.1)
Sodium: 139 mmol/L (ref 135–145)
Total Bilirubin: 0.3 mg/dL (ref 0.3–1.2)
Total Protein: 7 g/dL (ref 6.5–8.1)

## 2018-11-11 LAB — SAVE SMEAR (SSMR)

## 2018-11-11 LAB — TSH: TSH: 0.365 u[IU]/mL (ref 0.308–3.960)

## 2018-11-11 LAB — LACTATE DEHYDROGENASE: LDH: 394 U/L — ABNORMAL HIGH (ref 98–192)

## 2018-11-11 NOTE — Progress Notes (Signed)
Hematology and Oncology Follow Up Visit  Rachael Johnson [id number] Nov 16, 1950 68 y.o. 11/11/2018   Principle Diagnosis:  Post-ET myelofibrosis Essential thrombocythemia - JAK2 negative -- NGS (-) Intermittent iron-deficiency anemia  Past Therapy: Hydrea 1000/1000/500 mg by mouth daily - on hold starting 05/14/2017 - DC'd on 05/28/2017  Current Therapy:     Aspirin 81 mg by mouth daily IV iron as indicated - last dose given on 08/16/2018 PEG-Interferon 45 mcg sq q week -- start 10/07/2018   HISTORY: Rachael Johnson is back for follow-up.  She is doing a little bit better.  Sancuso patch does seem to help with her nausea..  She does feel tired.  We are checking her TSH.  She does have hypothyroidism.  Hopefully, the PEG-interferon is not causing more problems for her.  She has had no diarrhea.  There is been no mouth sores.  She has had no fever.  There is been no cough.  She has had no leg swelling.  Thankfully, her platelet count is slowly coming down.  The interferon is certainly doing a job which has not been done by other medicines.  Overall, I would say her performance status is ECOG 1.    Medications:  Allergies as of 11/11/2018   No Known Allergies     Medication List       Accurate as of November 11, 2018 11:52 AM. Always use your most recent med list.        albuterol 108 (90 Base) MCG/ACT inhaler Commonly known as:  PROVENTIL HFA;VENTOLIN HFA INHALE 2 PUFFS BY MOUTH EVERY 6 HOURS AS NEEDED FOR WHEEZING OR SHORTNESS OF BREATH   albuterol 108 (90 Base) MCG/ACT inhaler Commonly known as:  PROVENTIL HFA;VENTOLIN HFA INHALE 2 PUFFS BY MOUTH EVERY 6 HOURS AS NEEDED FOR WHEEZING OR SHORTNESS OF BREATH   ALPRAZolam 1 MG tablet Commonly known as:  XANAX TAKE 1/2 TO 1 (ONE-HALF TO ONE) TABLET BY MOUTH THREE TIMES DAILY AS NEEDED FOR ANXIETY   aspirin EC 81 MG tablet Take 81 mg by mouth every morning.   atorvastatin 10 MG tablet Commonly known as:  LIPITOR Take 1  tablet (10 mg total) by mouth daily.   budesonide-formoterol 80-4.5 MCG/ACT inhaler Commonly known as:  Symbicort Inhale 2 puffs into the lungs 2 (two) times daily.   busPIRone 15 MG tablet Commonly known as:  BUSPAR Take 1 tablet (15 mg total) by mouth 4 (four) times daily as needed.   cetirizine 10 MG tablet Commonly known as:  ZYRTEC Take 1 tablet (10 mg total) by mouth daily.   chlorpheniramine 4 MG tablet Commonly known as:  CHLOR-TRIMETON Take 2 tablets (8 mg total) by mouth at bedtime as needed for allergies.   Cranberry 300 MG tablet Take 300 mg by mouth 2 (two) times daily.   cyclobenzaprine 10 MG tablet Commonly known as:  FLEXERIL TAKE 1 TABLET BY MOUTH TWICE DAILY AS NEEDED FOR MUSCLE SPASM   cyclobenzaprine 10 MG tablet Commonly known as:  FLEXERIL TAKE 1 TABLET BY MOUTH TWICE DAILY AS NEEDED FOR MUSCLE SPASM   estradiol 0.5 MG tablet Commonly known as:  ESTRACE Take 1 tablet (0.5 mg total) by mouth 2 (two) times daily.   famotidine 40 MG tablet Commonly known as:  PEPCID Take 1 tablet (40 mg total) by mouth daily.   fluconazole 150 MG tablet Commonly known as:  DIFLUCAN Take 1 tablet (150 mg total) by mouth once a week.   fluticasone 50 MCG/ACT nasal spray  Commonly known as:  FLONASE Place 2 sprays into both nostrils daily.   furosemide 20 MG tablet Commonly known as:  LASIX Take 1 tablet (20 mg total) by mouth 3 (three) times daily as needed for fluid or edema.   gabapentin 100 MG capsule Commonly known as:  NEURONTIN Take 100 mg by mouth daily. Take 4 capsules by mouth at bedtime   granisetron 3.1 MG/24HR Commonly known as:  SANCUSO Apply to skin starting 24 hours before chemotherapy. Remove after 7 days.   magic mouthwash Soln Take 5 mLs by mouth 3 (three) times daily as needed for mouth pain. Components benadryl  525 mg, hydrocortisone 60 mg and nystatin 0.6 mg. 240 ml - Oral   meclizine 12.5 MG tablet Commonly known as:  ANTIVERT TAKE 1  TABLET BY MOUTH THREE TIMES DAILY AS NEEDED FOR DIZZINESS   montelukast 10 MG tablet Commonly known as:  SINGULAIR Take 1 tablet (10 mg total) by mouth at bedtime.   MULTIVITAMIN PO Take 1 tablet by mouth every morning.   nystatin 100000 UNIT/ML suspension Commonly known as:  MYCOSTATIN   ondansetron 8 MG tablet Commonly known as:  ZOFRAN TAKE 1/2 TO 1 TABLET BY MOUTH EVERY 8 HOURS AS NEEDED FOR NAUSEA AND VOMITING   oxyCODONE-acetaminophen 10-325 MG tablet Commonly known as:  PERCOCET Take 1 tablet by mouth every 6 (six) hours as needed for pain.   peginterferon alfa-2a 180 MCG/ML injection Commonly known as:  PEGASYS Inject 45 mcg into the skin every 7 (seven) days.   Restasis 0.05 % ophthalmic emulsion Generic drug:  cycloSPORINE PLACE ONE DROP INTO EACH EYE EVERY 12 HOURS   silver sulfADIAZINE 1 % cream Commonly known as:  SILVADENE Apply 1 application topically daily.   TUBERCULIN SYR 1CC/27GX1/2" 27G X 1/2" 1 ML Misc Use as directed once weekly for peg-interferon injections   venlafaxine XR 150 MG 24 hr capsule Commonly known as:  EFFEXOR-XR Take 1 capsule (150 mg total) by mouth daily with breakfast.       Allergies: No Known Allergies  Past Medical History, Surgical history, Social history, and Family History were reviewed and updated.  Review of Systems: Review of Systems  Constitutional: Positive for diaphoresis and malaise/fatigue.  HENT: Negative.   Eyes: Negative.   Respiratory: Positive for shortness of breath.   Cardiovascular: Negative.   Gastrointestinal: Positive for abdominal pain, heartburn and nausea.  Genitourinary: Negative.   Musculoskeletal: Positive for joint pain and myalgias.  Skin: Negative.   Neurological: Positive for dizziness.  Endo/Heme/Allergies: Negative.   Psychiatric/Behavioral: Negative.      Physical Exam:  weight is 162 lb (73.5 kg). Her oral temperature is 98.3 F (36.8 C). Her blood pressure is 124/86 and her  pulse is 87. Her respiration is 18 and oxygen saturation is 96%.   Wt Readings from Last 3 Encounters:  11/11/18 162 lb (73.5 kg)  10/21/18 161 lb (73 kg)  09/30/18 159 lb (72.1 kg)    Physical Exam Vitals signs reviewed.  HENT:     Head: Normocephalic and atraumatic.  Eyes:     Pupils: Pupils are equal, round, and reactive to light.  Neck:     Musculoskeletal: Normal range of motion.  Cardiovascular:     Rate and Rhythm: Normal rate and regular rhythm.     Heart sounds: Normal heart sounds.  Pulmonary:     Effort: Pulmonary effort is normal.     Breath sounds: Normal breath sounds.  Abdominal:  General: Bowel sounds are normal.     Palpations: Abdomen is soft.     Comments: Her spleen tip is palpable a couple centimeters below the left costal margin.  Musculoskeletal: Normal range of motion.        General: No tenderness or deformity.  Lymphadenopathy:     Cervical: No cervical adenopathy.  Skin:    General: Skin is warm and dry.     Findings: No erythema or rash.  Neurological:     Mental Status: She is alert and oriented to person, place, and time.  Psychiatric:        Behavior: Behavior normal.        Thought Content: Thought content normal.        Judgment: Judgment normal.      Lab Results  Component Value Date   WBC 3.7 (L) 11/11/2018   HGB 10.7 (L) 11/11/2018   HCT 33.6 (L) 11/11/2018   MCV 105.3 (H) 11/11/2018   PLT 658 (H) 11/11/2018   Lab Results  Component Value Date   FERRITIN 2,054 (H) 10/21/2018   IRON 90 10/21/2018   TIBC 195 (L) 10/21/2018   UIBC 105 (L) 10/21/2018   IRONPCTSAT 46 10/21/2018   Lab Results  Component Value Date   RETICCTPCT 1.9 10/21/2018   RBC 3.19 (L) 11/11/2018   RETICCTABS 45.8 06/02/2015   No results found for: KPAFRELGTCHN, LAMBDASER, KAPLAMBRATIO No results found for: IGGSERUM, IGA, IGMSERUM No results found for: Odetta Pink, SPEI   Chemistry       Component Value Date/Time   NA 139 11/11/2018 0938   NA 143 07/16/2017 1057   NA 140 11/29/2016 0936   K 3.8 11/11/2018 0938   K 3.2 (L) 07/16/2017 1057   K 4.2 11/29/2016 0936   CL 102 11/11/2018 0938   CL 104 07/16/2017 1057   CO2 31 11/11/2018 0938   CO2 25 07/16/2017 1057   CO2 27 11/29/2016 0936   BUN 13 11/11/2018 0938   BUN 10 07/16/2017 1057   BUN 18.3 11/29/2016 0936   CREATININE 0.84 11/11/2018 0938   CREATININE 0.8 07/16/2017 1057   CREATININE 0.8 11/29/2016 0936      Component Value Date/Time   CALCIUM 8.7 (L) 11/11/2018 0938   CALCIUM 8.9 07/16/2017 1057   CALCIUM 8.9 11/29/2016 0936   ALKPHOS 100 11/11/2018 0938   ALKPHOS 71 07/16/2017 1057   ALKPHOS 77 11/29/2016 0936   AST 43 (H) 11/11/2018 0938   AST 19 11/29/2016 0936   ALT 28 11/11/2018 0938   ALT 27 07/16/2017 1057   ALT 10 11/29/2016 0936   BILITOT 0.3 11/11/2018 0938   BILITOT <0.22 11/29/2016 0936      Impression and Plan: Ms. Ahart is a very pleasant 68 yo caucasian female with essential thrombocythemia.   I will not change the dose of the PEG interferon as of yet.  We will see how everything looks in the future with her platelets.  Hopefully, we can spread the interval out that we have to give the PEG-interferon.  Again, I just want to make sure that her quality of life is as good as possible.  This is very important for her.  We will get her back in 3 weeks.        [name], MD 4/13/202011:52 AM

## 2018-11-16 DIAGNOSIS — D473 Essential (hemorrhagic) thrombocythemia: Secondary | ICD-10-CM | POA: Diagnosis not present

## 2018-11-16 DIAGNOSIS — D509 Iron deficiency anemia, unspecified: Secondary | ICD-10-CM | POA: Diagnosis not present

## 2018-11-16 DIAGNOSIS — D7581 Myelofibrosis: Secondary | ICD-10-CM | POA: Diagnosis not present

## 2018-11-23 DIAGNOSIS — D7581 Myelofibrosis: Secondary | ICD-10-CM | POA: Diagnosis not present

## 2018-11-23 DIAGNOSIS — D473 Essential (hemorrhagic) thrombocythemia: Secondary | ICD-10-CM | POA: Diagnosis not present

## 2018-11-23 DIAGNOSIS — D509 Iron deficiency anemia, unspecified: Secondary | ICD-10-CM | POA: Diagnosis not present

## 2018-11-26 MED FILL — BD TB SYRINGE 27GX1/2": 27G X 1/2" | 28 days supply | Qty: 4 | Fill #2

## 2018-11-26 MED FILL — PEGASYS 180 MCG/ML VIAL: 180 | 28 days supply | Qty: 4 | Fill #2

## 2018-11-26 MED FILL — BD TB SYRINGE 27GX1/2: 27G X 1/2" | 28 days supply | Qty: 4 | Fill #2

## 2018-11-28 ENCOUNTER — Other Ambulatory Visit: Payer: Self-pay | Admitting: Family Medicine

## 2018-11-28 DIAGNOSIS — R300 Dysuria: Secondary | ICD-10-CM

## 2018-11-28 DIAGNOSIS — I10 Essential (primary) hypertension: Secondary | ICD-10-CM

## 2018-11-28 DIAGNOSIS — R739 Hyperglycemia, unspecified: Secondary | ICD-10-CM

## 2018-11-28 DIAGNOSIS — E559 Vitamin D deficiency, unspecified: Secondary | ICD-10-CM

## 2018-11-30 DIAGNOSIS — D473 Essential (hemorrhagic) thrombocythemia: Secondary | ICD-10-CM | POA: Diagnosis not present

## 2018-11-30 DIAGNOSIS — D509 Iron deficiency anemia, unspecified: Secondary | ICD-10-CM | POA: Diagnosis not present

## 2018-11-30 DIAGNOSIS — D7581 Myelofibrosis: Secondary | ICD-10-CM | POA: Diagnosis not present

## 2018-12-02 ENCOUNTER — Encounter: Payer: Self-pay | Admitting: Hematology & Oncology

## 2018-12-02 ENCOUNTER — Other Ambulatory Visit: Payer: Self-pay

## 2018-12-02 ENCOUNTER — Inpatient Hospital Stay: Payer: Medicare Other | Attending: Hematology & Oncology | Admitting: Hematology & Oncology

## 2018-12-02 ENCOUNTER — Telehealth: Payer: Self-pay | Admitting: Family Medicine

## 2018-12-02 ENCOUNTER — Ambulatory Visit (INDEPENDENT_AMBULATORY_CARE_PROVIDER_SITE_OTHER): Payer: Medicare Other | Admitting: Family Medicine

## 2018-12-02 ENCOUNTER — Telehealth: Payer: Self-pay | Admitting: *Deleted

## 2018-12-02 ENCOUNTER — Telehealth: Payer: Self-pay | Admitting: Hematology & Oncology

## 2018-12-02 ENCOUNTER — Inpatient Hospital Stay: Payer: Medicare Other

## 2018-12-02 VITALS — BP 106/66 | HR 96 | Temp 98.6°F | Resp 18 | Wt 159.0 lb

## 2018-12-02 DIAGNOSIS — E559 Vitamin D deficiency, unspecified: Secondary | ICD-10-CM | POA: Diagnosis not present

## 2018-12-02 DIAGNOSIS — M549 Dorsalgia, unspecified: Secondary | ICD-10-CM | POA: Diagnosis not present

## 2018-12-02 DIAGNOSIS — D75839 Thrombocytosis, unspecified: Secondary | ICD-10-CM

## 2018-12-02 DIAGNOSIS — F418 Other specified anxiety disorders: Secondary | ICD-10-CM

## 2018-12-02 DIAGNOSIS — G8929 Other chronic pain: Secondary | ICD-10-CM

## 2018-12-02 DIAGNOSIS — Z79899 Other long term (current) drug therapy: Secondary | ICD-10-CM | POA: Insufficient documentation

## 2018-12-02 DIAGNOSIS — I10 Essential (primary) hypertension: Secondary | ICD-10-CM | POA: Diagnosis not present

## 2018-12-02 DIAGNOSIS — F329 Major depressive disorder, single episode, unspecified: Secondary | ICD-10-CM | POA: Insufficient documentation

## 2018-12-02 DIAGNOSIS — D509 Iron deficiency anemia, unspecified: Secondary | ICD-10-CM | POA: Diagnosis not present

## 2018-12-02 DIAGNOSIS — G4700 Insomnia, unspecified: Secondary | ICD-10-CM | POA: Diagnosis not present

## 2018-12-02 DIAGNOSIS — D7581 Myelofibrosis: Secondary | ICD-10-CM | POA: Diagnosis not present

## 2018-12-02 DIAGNOSIS — Z7982 Long term (current) use of aspirin: Secondary | ICD-10-CM | POA: Diagnosis not present

## 2018-12-02 DIAGNOSIS — R739 Hyperglycemia, unspecified: Secondary | ICD-10-CM

## 2018-12-02 DIAGNOSIS — D500 Iron deficiency anemia secondary to blood loss (chronic): Secondary | ICD-10-CM

## 2018-12-02 DIAGNOSIS — E039 Hypothyroidism, unspecified: Secondary | ICD-10-CM | POA: Diagnosis not present

## 2018-12-02 DIAGNOSIS — D473 Essential (hemorrhagic) thrombocythemia: Secondary | ICD-10-CM | POA: Diagnosis not present

## 2018-12-02 LAB — CMP (CANCER CENTER ONLY)
ALT: 29 U/L (ref 0–44)
AST: 39 U/L (ref 15–41)
Albumin: 4.1 g/dL (ref 3.5–5.0)
Alkaline Phosphatase: 87 U/L (ref 38–126)
Anion gap: 7 (ref 5–15)
BUN: 19 mg/dL (ref 8–23)
CO2: 29 mmol/L (ref 22–32)
Calcium: 8.8 mg/dL — ABNORMAL LOW (ref 8.9–10.3)
Chloride: 102 mmol/L (ref 98–111)
Creatinine: 0.93 mg/dL (ref 0.44–1.00)
GFR, Est AFR Am: >60 mL/min (ref >60)
GFR, Estimated: >60 mL/min (ref >60)
Glucose, Bld: 95 mg/dL (ref 70–99)
Potassium: 3.5 mmol/L (ref 3.5–5.1)
Sodium: 138 mmol/L (ref 135–145)
Total Bilirubin: 0.3 mg/dL (ref 0.3–1.2)
Total Protein: 7.3 g/dL (ref 6.5–8.1)

## 2018-12-02 LAB — CBC WITH DIFFERENTIAL (CANCER CENTER ONLY)
Abs Immature Granulocytes: 0.02 10*3/uL (ref 0.00–0.07)
Basophils Absolute: 0 10*3/uL (ref 0.0–0.1)
Basophils Relative: 1 %
Eosinophils Absolute: 0.2 10*3/uL (ref 0.0–0.5)
Eosinophils Relative: 6 %
HCT: 32.1 % — ABNORMAL LOW (ref 36.0–46.0)
Hemoglobin: 10.3 g/dL — ABNORMAL LOW (ref 12.0–15.0)
Immature Granulocytes: 1 %
Lymphocytes Relative: 23 %
Lymphs Abs: 0.8 10*3/uL (ref 0.7–4.0)
MCH: 33.7 pg (ref 26.0–34.0)
MCHC: 32.1 g/dL (ref 30.0–36.0)
MCV: 104.9 fL — ABNORMAL HIGH (ref 80.0–100.0)
Monocytes Absolute: 0.4 10*3/uL (ref 0.1–1.0)
Monocytes Relative: 10 %
Neutro Abs: 2.1 10*3/uL (ref 1.7–7.7)
Neutrophils Relative %: 59 %
Platelet Count: 415 10*3/uL — ABNORMAL HIGH (ref 150–400)
RBC: 3.06 MIL/uL — ABNORMAL LOW (ref 3.87–5.11)
RDW: 15.6 % — ABNORMAL HIGH (ref 11.5–15.5)
WBC Count: 3.5 10*3/uL — ABNORMAL LOW (ref 4.0–10.5)
nRBC: 0 % (ref 0.0–0.2)

## 2018-12-02 LAB — SAVE SMEAR(SSMR), FOR PROVIDER SLIDE REVIEW

## 2018-12-02 LAB — PLATELET BY CITRATE

## 2018-12-02 LAB — TSH: TSH: 0.379 u[IU]/mL (ref 0.308–3.960)

## 2018-12-02 MED ORDER — OXYCODONE-ACETAMINOPHEN 10-325 MG PO TABS: 1.0000 | ORAL_TABLET | Freq: Four times a day (QID) | ORAL | 0 refills | Status: DC | PRN

## 2018-12-02 MED ORDER — TRAZODONE HCL 50 MG PO TABS: 50.0000 mg | ORAL_TABLET | Freq: Every day | ORAL | 3 refills | Status: DC

## 2018-12-02 NOTE — Telephone Encounter (Signed)
Message left on patient's private cell phone to inform her per order of Dr. Marin Olp that thyroid is OK.  Instructed pt to call office back with any questions or concerns.

## 2018-12-02 NOTE — Progress Notes (Signed)
Hematology and Oncology Follow Up Visit  Rachael Johnson [id number] 1951-04-03 68 y.o. 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  Finally, the PEG-interferon is working.  Her platelet count is now down to 415,000.  Her white cell count is stable and her hemoglobin is stable.  Her main complaint is that she is not sleeping well.  She says she has not slept for the past couple nights.  I will try her on some trazodone (50 mg p.o. nightly) and let us see if this cannot help.  She is isolating herself.  She has not grown anywhere.  She has had no problems with fever.  She has had no cough.  She has had no rashes.  She does have some depression.  She will see her family doctor later today.  She is on BuSpar and Effexor.  I am unsure what would need to be done outside of maybe increasing the dose of one or the other.  We are checking her TSH.  We checked it back in April, her TSH was 0.365.    Medications:  Allergies as of 12/02/2018   No Known Allergies     Medication List       Accurate as of Dec 02, 2018  8:42 AM. Always use your most recent med list.        albuterol 108 (90 Base) MCG/ACT inhaler Commonly known as:  VENTOLIN HFA INHALE 2 PUFFS BY MOUTH EVERY 6 HOURS AS NEEDED FOR WHEEZING OR SHORTNESS OF BREATH   albuterol 108 (90 Base) MCG/ACT inhaler Commonly known as:  VENTOLIN HFA INHALE 2 PUFFS BY MOUTH EVERY 6 HOURS AS NEEDED FOR WHEEZING OR SHORTNESS OF BREATH   ALPRAZolam 1 MG tablet Commonly known as:  XANAX TAKE 1/2 TO 1 (ONE-HALF TO ONE) TABLET BY MOUTH  THREE TIMES DAILY AS NEEDED FOR ANXIETY   aspirin EC 81 MG tablet Take 81 mg by mouth every morning.   atorvastatin 10 MG tablet Commonly known as:  LIPITOR Take 1 tablet (10 mg total) by mouth daily.   budesonide-formoterol 80-4.5 MCG/ACT inhaler Commonly known as:  Symbicort Inhale 2 puffs into the lungs 2 (two) times daily.   busPIRone 15 MG tablet Commonly known as:  BUSPAR Take 1 tablet (15 mg total) by mouth 4 (four) times daily as needed.   cetirizine 10 MG tablet Commonly known as:  ZYRTEC Take 1 tablet (10 mg total) by mouth daily.   chlorpheniramine 4 MG tablet Commonly known as:  CHLOR-TRIMETON Take 2 tablets (8 mg total) by mouth at bedtime as needed for allergies.   Cranberry 300 MG tablet Take 300 mg by mouth 2 (two) times daily.   cyclobenzaprine 10 MG tablet Commonly known as:  FLEXERIL TAKE 1 TABLET BY MOUTH TWICE DAILY AS NEEDED FOR MUSCLE SPASM   cyclobenzaprine 10 MG tablet Commonly known as:  FLEXERIL TAKE 1 TABLET BY MOUTH TWICE DAILY AS NEEDED FOR MUSCLE SPASM   estradiol 0.5 MG tablet Commonly known as:  ESTRACE Take 1 tablet (0.5 mg total) by mouth 2 (two) times daily.  Euthyrox 100 MCG tablet Generic drug:  levothyroxine Take 100 mcg by mouth daily.   famotidine 40 MG tablet Commonly known as:  PEPCID Take 1 tablet (40 mg total) by mouth daily.   fluconazole 150 MG tablet Commonly known as:  DIFLUCAN Take 1 tablet (150 mg total) by mouth once a week.   fluticasone 50 MCG/ACT nasal spray Commonly known as:  FLONASE Place 2 sprays into both nostrils daily.   furosemide 20 MG tablet Commonly known as:  LASIX TAKE 1 TABLET BY MOUTH THREE TIMES DAILY AS NEEDED FOR  FLIUD  OR  EDEMA   gabapentin 100 MG capsule Commonly known as:  NEURONTIN Take 100 mg by mouth daily. Take 4 capsules by mouth at bedtime   granisetron 3.1 MG/24HR Commonly known as:  SANCUSO Apply to skin starting 24 hours before chemotherapy. Remove after 7 days.    magic mouthwash Soln Take 5 mLs by mouth 3 (three) times daily as needed for mouth pain. Components benadryl  525 mg, hydrocortisone 60 mg and nystatin 0.6 mg. 240 ml - Oral   meclizine 12.5 MG tablet Commonly known as:  ANTIVERT TAKE 1 TABLET BY MOUTH THREE TIMES DAILY AS NEEDED FOR DIZZINESS   montelukast 10 MG tablet Commonly known as:  SINGULAIR Take 1 tablet (10 mg total) by mouth at bedtime.   MULTIVITAMIN PO Take 1 tablet by mouth every morning.   nystatin 100000 UNIT/ML suspension Commonly known as:  MYCOSTATIN   ondansetron 8 MG tablet Commonly known as:  ZOFRAN TAKE 1/2 TO 1 TABLET BY MOUTH EVERY 8 HOURS AS NEEDED FOR NAUSEA AND VOMITING   oxyCODONE-acetaminophen 10-325 MG tablet Commonly known as:  PERCOCET Take 1 tablet by mouth every 6 (six) hours as needed for pain.   peginterferon alfa-2a 180 MCG/ML injection Commonly known as:  PEGASYS Inject 45 mcg into the skin every 7 (seven) days.   Restasis 0.05 % ophthalmic emulsion Generic drug:  cycloSPORINE PLACE ONE DROP INTO EACH EYE EVERY 12 HOURS   silver sulfADIAZINE 1 % cream Commonly known as:  SILVADENE Apply 1 application topically daily.   traZODone 50 MG tablet Commonly known as:  DESYREL Take 1 tablet (50 mg total) by mouth at bedtime.   TUBERCULIN SYR 1CC/27GX1/2" 27G X 1/2" 1 ML Misc Use as directed once weekly for peg-interferon injections   venlafaxine XR 150 MG 24 hr capsule Commonly known as:  EFFEXOR-XR Take 1 capsule (150 mg total) by mouth daily with breakfast.       Allergies: No Known Allergies  Past Medical History, Surgical history, Social history, and Family History were reviewed and updated.  Review of Systems: Review of Systems  Constitutional: Positive for diaphoresis and malaise/fatigue.  HENT: Negative.   Eyes: Negative.   Respiratory: Positive for shortness of breath.   Cardiovascular: Negative.   Gastrointestinal: Positive for abdominal pain, heartburn and nausea.   Genitourinary: Negative.   Musculoskeletal: Positive for joint pain and myalgias.  Skin: Negative.   Neurological: Positive for dizziness.  Endo/Heme/Allergies: Negative.   Psychiatric/Behavioral: Negative.      Physical Exam:  weight is 159 lb (72.1 kg). Her oral temperature is 98.6 F (37 C). Her blood pressure is 106/66 and her pulse is 96. Her respiration is 18 and oxygen saturation is 96%.   Wt Readings from Last 3 Encounters:  12/02/18 159 lb (72.1 kg)  11/11/18 162 lb (73.5 kg)  10/21/18 161 lb (73 kg)    Physical Exam Vitals signs reviewed.  HENT:  Head: Normocephalic and atraumatic.  Eyes:     Pupils: Pupils are equal, round, and reactive to light.  Neck:     Musculoskeletal: Normal range of motion.  Cardiovascular:     Rate and Rhythm: Normal rate and regular rhythm.     Heart sounds: Normal heart sounds.  Pulmonary:     Effort: Pulmonary effort is normal.     Breath sounds: Normal breath sounds.  Abdominal:     General: Bowel sounds are normal.     Palpations: Abdomen is soft.     Comments: Her spleen tip is palpable a couple centimeters below the left costal margin.  Musculoskeletal: Normal range of motion.        General: No tenderness or deformity.  Lymphadenopathy:     Cervical: No cervical adenopathy.  Skin:    General: Skin is warm and dry.     Findings: No erythema or rash.  Neurological:     Mental Status: She is alert and oriented to person, place, and time.  Psychiatric:        Behavior: Behavior normal.        Thought Content: Thought content normal.        Judgment: Judgment normal.      Lab Results  Component Value Date   WBC 3.5 (L) 12/02/2018   HGB 10.3 (L) 12/02/2018   HCT 32.1 (L) 12/02/2018   MCV 104.9 (H) 12/02/2018   PLT 415 (H) 12/02/2018   Lab Results  Component Value Date   FERRITIN 2,054 (H) 10/21/2018   IRON 90 10/21/2018   TIBC 195 (L) 10/21/2018   UIBC 105 (L) 10/21/2018   IRONPCTSAT 46 10/21/2018   Lab  Results  Component Value Date   RETICCTPCT 1.9 10/21/2018   RBC 3.06 (L) 12/02/2018   RETICCTABS 45.8 06/02/2015   No results found for: KPAFRELGTCHN, LAMBDASER, KAPLAMBRATIO No results found for: IGGSERUM, IGA, IGMSERUM No results found for: Odetta Pink, SPEI   Chemistry      Component Value Date/Time   NA 138 12/02/2018 0759   NA 143 07/16/2017 1057   NA 140 11/29/2016 0936   K 3.5 12/02/2018 0759   K 3.2 (L) 07/16/2017 1057   K 4.2 11/29/2016 0936   CL 102 12/02/2018 0759   CL 104 07/16/2017 1057   CO2 29 12/02/2018 0759   CO2 25 07/16/2017 1057   CO2 27 11/29/2016 0936   BUN 19 12/02/2018 0759   BUN 10 07/16/2017 1057   BUN 18.3 11/29/2016 0936   CREATININE 0.93 12/02/2018 0759   CREATININE 0.8 07/16/2017 1057   CREATININE 0.8 11/29/2016 0936      Component Value Date/Time   CALCIUM 8.8 (L) 12/02/2018 0759   CALCIUM 8.9 07/16/2017 1057   CALCIUM 8.9 11/29/2016 0936   ALKPHOS 87 12/02/2018 0759   ALKPHOS 71 07/16/2017 1057   ALKPHOS 77 11/29/2016 0936   AST 39 12/02/2018 0759   AST 19 11/29/2016 0936   ALT 29 12/02/2018 0759   ALT 27 07/16/2017 1057   ALT 10 11/29/2016 0936   BILITOT 0.3 12/02/2018 0759   BILITOT <0.22 11/29/2016 0936      Impression and Plan: Ms. Andreasen is a very pleasant 68 yo caucasian female with essential thrombocythemia.   I will not change the dose of the PEG interferon as of yet.  We will see how everything looks in the future with her platelets.  Hopefully, we can spread the interval out that  we have to give the PEG-interferon.  Again, I just want to make sure that her quality of life is as good as possible.  This is very important for her.  We will get her back in 4 weeks.        [name], MD 5/4/20208:42 AM

## 2018-12-02 NOTE — Telephone Encounter (Signed)
-----   Message from [name], MD sent at 12/02/2018  1:46 PM EDT ----- Call -- thyroid is ok!!  Laurey Arrow

## 2018-12-02 NOTE — Telephone Encounter (Signed)
LVM for patient to call back to schedule 6 week f/u appointment from 12/02/18.

## 2018-12-02 NOTE — Telephone Encounter (Signed)
lmom to inform pt of 6/1 appts at 1045 am per 5/4 LOS

## 2018-12-04 NOTE — Assessment & Plan Note (Signed)
no changes to meds. Encouraged heart healthy diet such as the DASH diet and exercise as tolerated.  

## 2018-12-04 NOTE — Assessment & Plan Note (Signed)
Is managing the stress of the pandemic with the support of her family. Her son is a Airline pilot so she worries about him quite a bit. Does not feel she needs a change in treatment in this regard.

## 2018-12-04 NOTE — Assessment & Plan Note (Signed)
Managing her pain well with current meds.

## 2018-12-04 NOTE — Assessment & Plan Note (Signed)
Continues to follow closely with oncology and they continue to find the right medicaiton regimen for her.

## 2018-12-04 NOTE — Progress Notes (Signed)
Virtual Visit via Telephone Note  I connected with Rachael Johnson on 12/04/18 at 10:00 AM EDT by a telephone enabled telemedicine application and verified that I am speaking with the correct person using two identifiers.  Location: Patient: home Provider: office   I discussed the limitations of evaluation and management by telemedicine and the availability of in person appointments. The patient expressed understanding and agreed to proceed. Magdalene Molly, CMA attempted to get patient set up on video platform so visit was completed with a phone call    Subjective:    Patient ID: Rachael Johnson, female    DOB: [birth year], 68 y.o.   MRN: [id number]  No chief complaint on file.   HPI Patient is in today for follow up on chronic medical concerns including anxiety, chronic pain, hyperglycemia and hypertension. She feels well today. She is managing her quarantine well with the help of her family. No recent febrile illness or recent hospitalizations. Continues to follow closely with oncology. Denies CP/palp/SOB/HA/congestion/fevers/GI or GU c/o. Taking meds as prescribed  Past Medical History:  Diagnosis Date  . Acute renal insufficiency 04/09/2017  . Anemia, iron deficiency 07/08/2012  . Arthritis   . Cataract 12/21/2014  . Depression with anxiety 03/24/2011  . Diverticulitis   . Esophageal reflux 08/17/2013  . Essential thrombocythemia ([place]) 01/24/2011  . Frequent episodic tension-type headache   . Gastroenteritis 12/21/2014  . 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 GERD (gastroesophageal reflux disease)   . H. pylori infection 12/19/2012  . Hyperglycemia 12/17/2015  . Hypertension   . Iron deficiency anemia due to chronic blood loss 04/03/2017  . Osteoarthritis 05/24/2017  . Other and unspecified hyperlipidemia 02/25/2013  . Restless leg syndrome 09/30/2014  . Rhabdomyolysis 02/2017  . Scabies 03/28/2015  . Secondary myelofibrosis ([place]) 11/29/2017  . Seizure ([place])    childhood  . Shoulder wound,  right, sequela 03/14/2017  . Thyroid disease     Past Surgical History:  Procedure Laterality Date  . ABDOMINAL HYSTERECTOMY     1992  . BREAST CYST ASPIRATION    . BREAST SURGERY  1992   biopsy, benign. Fibrocystic  . UMBILICAL HERNIA REPAIR N/A 09/25/2012   Procedure: HERNIA REPAIR UMBILICAL ADULT;  Surgeon: Harl Bowie, MD;  Location: [facility];  Service: General;  Laterality: N/A;    Family History  Problem Relation Age of Onset  . Arthritis Mother   . Cancer Mother        ovarian  . Hypertension Mother   . Heart disease Mother        pacer  . Heart failure Mother   . Arthritis Father   . Cancer Father        lung  . Hypertension Father   . Cancer Sister        lung  . Cancer Brother        prostate  . Cancer Brother        lung  . Hypertension Son   . COPD Brother   . Heart disease Brother        died from CHF  . Hypertension Brother   . Cancer Brother        colon  . Alcohol abuse Brother   . Cirrhosis Brother   . Seizures Sister   . Stroke Sister   . Arthritis Brother     Social History   Socioeconomic History  . Marital status: Widowed    Spouse name: Not on file  . Number of children: 2  .  Years of education: Not on file  . Highest education level: Not on file  Occupational History  . Occupation: APL IT sales professional: [employer]    Comment: [place]  . Financial resource strain: Not on file  . Food insecurity:    Worry: Not on file    Inability: Not on file  . Transportation needs:    Medical: Not on file    Non-medical: Not on file  Tobacco Use  . Smoking status: Former Smoker    Packs/day: 3.50    Years: 20.00    Pack years: 70.00    Types: Cigarettes    Start date: 11/15/1970    Last attempt to quit: 07/31/1990    Years since quitting: 28.3  . Smokeless tobacco: Never Used  . Tobacco comment: quit 23 years ago  Substance and Sexual Activity  . Alcohol use: No    Alcohol/week: 0.0 standard drinks  . Drug  use: No  . Sexual activity: Never  Lifestyle  . Physical activity:    Days per week: Not on file    Minutes per session: Not on file  . Stress: Not on file  Relationships  . Social connections:    Talks on phone: Not on file    Gets together: Not on file    Attends religious service: Not on file    Active member of club or organization: Not on file    Attends meetings of clubs or organizations: Not on file    Relationship status: Not on file  . Intimate partner violence:    Fear of current or ex partner: Not on file    Emotionally abused: Not on file    Physically abused: Not on file    Forced sexual activity: Not on file  Other Topics Concern  . Not on file  Social History Narrative   Regular exercise: no   Caffeine Use: 2-3 weekly    Outpatient Medications Prior to Visit  Medication Sig Dispense Refill  . albuterol (PROVENTIL HFA;VENTOLIN HFA) 108 (90 Base) MCG/ACT inhaler INHALE 2 PUFFS BY MOUTH EVERY 6 HOURS AS NEEDED FOR WHEEZING OR SHORTNESS OF BREATH 54 g 0  . albuterol (PROVENTIL HFA;VENTOLIN HFA) 108 (90 Base) MCG/ACT inhaler INHALE 2 PUFFS BY MOUTH EVERY 6 HOURS AS NEEDED FOR WHEEZING OR SHORTNESS OF BREATH 18 g 5  . ALPRAZolam (XANAX) 1 MG tablet TAKE 1/2 TO 1 (ONE-HALF TO ONE) TABLET BY MOUTH THREE TIMES DAILY AS NEEDED FOR ANXIETY 90 tablet 1  . aspirin EC 81 MG tablet Take 81 mg by mouth every morning.    Marland Kitchen atorvastatin (LIPITOR) 10 MG tablet Take 1 tablet (10 mg total) by mouth daily. 90 tablet 3  . budesonide-formoterol (SYMBICORT) 80-4.5 MCG/ACT inhaler Inhale 2 puffs into the lungs 2 (two) times daily. 10.2 g 5  . busPIRone (BUSPAR) 15 MG tablet Take 1 tablet (15 mg total) by mouth 4 (four) times daily as needed. 120 tablet 2  . cetirizine (ZYRTEC) 10 MG tablet Take 1 tablet (10 mg total) by mouth daily. 90 tablet 1  . chlorpheniramine (CHLOR-TRIMETON) 4 MG tablet Take 2 tablets (8 mg total) by mouth at bedtime as needed for allergies. 30 tablet 1  . Cranberry  300 MG tablet Take 300 mg by mouth 2 (two) times daily.    . cyclobenzaprine (FLEXERIL) 10 MG tablet TAKE 1 TABLET BY MOUTH TWICE DAILY AS NEEDED FOR MUSCLE SPASM 60 tablet 0  . cyclobenzaprine (FLEXERIL) 10  MG tablet TAKE 1 TABLET BY MOUTH TWICE DAILY AS NEEDED FOR MUSCLE SPASM 60 tablet 0  . estradiol (ESTRACE) 0.5 MG tablet Take 1 tablet (0.5 mg total) by mouth 2 (two) times daily. 180 tablet 1  . EUTHYROX 100 MCG tablet Take 100 mcg by mouth daily.    . famotidine (PEPCID) 40 MG tablet Take 1 tablet (40 mg total) by mouth daily. 90 tablet 1  . fluconazole (DIFLUCAN) 150 MG tablet Take 1 tablet (150 mg total) by mouth once a week. 2 tablet 1  . fluticasone (FLONASE) 50 MCG/ACT nasal spray Place 2 sprays into both nostrils daily. 48 g 3  . furosemide (LASIX) 20 MG tablet TAKE 1 TABLET BY MOUTH THREE TIMES DAILY AS NEEDED FOR  FLIUD  OR  EDEMA 90 tablet 0  . gabapentin (NEURONTIN) 100 MG capsule Take 100 mg by mouth daily. Take 4 capsules by mouth at bedtime    . granisetron (SANCUSO) 3.1 MG/24HR Apply to skin starting 24 hours before chemotherapy. Remove after 7 days. 4 each 4  . magic mouthwash SOLN Take 5 mLs by mouth 3 (three) times daily as needed for mouth pain. Components benadryl  525 mg, hydrocortisone 60 mg and nystatin 0.6 mg. 240 ml - Oral 240 mL 3  . meclizine (ANTIVERT) 12.5 MG tablet TAKE 1 TABLET BY MOUTH THREE TIMES DAILY AS NEEDED FOR DIZZINESS 40 tablet 0  . montelukast (SINGULAIR) 10 MG tablet Take 1 tablet (10 mg total) by mouth at bedtime. 90 tablet 2  . Multiple Vitamin (MULTIVITAMIN PO) Take 1 tablet by mouth every morning.     . nystatin (MYCOSTATIN) 100000 UNIT/ML suspension     . ondansetron (ZOFRAN) 8 MG tablet TAKE 1/2 TO 1 TABLET BY MOUTH EVERY 8 HOURS AS NEEDED FOR NAUSEA AND VOMITING 90 tablet 1  . peginterferon alfa-2a (PEGASYS) 180 MCG/ML injection Inject 45 mcg into the skin every 7 (seven) days.    . RESTASIS 0.05 % ophthalmic emulsion PLACE ONE DROP INTO EACH  EYE EVERY 12 HOURS 0.4 mL 3  . silver sulfADIAZINE (SILVADENE) 1 % cream Apply 1 application topically daily. 50 g 1  . traZODone (DESYREL) 50 MG tablet Take 1 tablet (50 mg total) by mouth at bedtime. 30 tablet 3  . TUBERCULIN SYR 1CC/27GX1/2" 27G X 1/2" 1 ML MISC Use as directed once weekly for peg-interferon injections 4 each 11  . venlafaxine XR (EFFEXOR-XR) 150 MG 24 hr capsule Take 1 capsule (150 mg total) by mouth daily with breakfast. 90 capsule 1  . oxyCODONE-acetaminophen (PERCOCET) 10-325 MG tablet Take 1 tablet by mouth every 6 (six) hours as needed for pain. 120 tablet 0   No facility-administered medications prior to visit.     No Known Allergies  Review of Systems  Constitutional: Positive for malaise/fatigue. Negative for fever.  HENT: Negative for congestion.   Eyes: Negative for blurred vision.  Respiratory: Negative for shortness of breath.   Cardiovascular: Negative for chest pain, palpitations and leg swelling.  Gastrointestinal: Negative for abdominal pain, blood in stool and nausea.  Genitourinary: Negative for dysuria and frequency.  Musculoskeletal: Positive for back pain, joint pain, myalgias and neck pain. Negative for falls.  Skin: Negative for rash.  Neurological: Negative for dizziness, loss of consciousness and headaches.  Endo/Heme/Allergies: Negative for environmental allergies.  Psychiatric/Behavioral: Negative for depression. The patient is nervous/anxious.        Objective:    Physical Exam unable to obtain due to telephone encounter  LMP  07/31/1990  Wt Readings from Last 3 Encounters:  12/02/18 159 lb (72.1 kg)  11/11/18 162 lb (73.5 kg)  10/21/18 161 lb (73 kg)    Diabetic Foot Exam - Simple   No data filed     Lab Results  Component Value Date   WBC 3.5 (L) 12/02/2018   HGB 10.3 (L) 12/02/2018   HCT 32.1 (L) 12/02/2018   PLT 415 (H) 12/02/2018   GLUCOSE 95 12/02/2018   CHOL 236 (H) 05/06/2018   TRIG 322.0 (H) 05/06/2018   HDL  59.00 05/06/2018   LDLDIRECT 95.0 05/06/2018   LDLCALC 82 02/12/2017   ALT 29 12/02/2018   AST 39 12/02/2018   NA 138 12/02/2018   K 3.5 12/02/2018   CL 102 12/02/2018   CREATININE 0.93 12/02/2018   BUN 19 12/02/2018   CO2 29 12/02/2018   TSH 0.379 12/02/2018   INR 0.94 09/16/2018   HGBA1C 5.0 08/10/2017    Lab Results  Component Value Date   TSH 0.379 12/02/2018   Lab Results  Component Value Date   WBC 3.5 (L) 12/02/2018   HGB 10.3 (L) 12/02/2018   HCT 32.1 (L) 12/02/2018   MCV 104.9 (H) 12/02/2018   PLT 415 (H) 12/02/2018   Lab Results  Component Value Date   NA 138 12/02/2018   K 3.5 12/02/2018   CHLORIDE 105 11/29/2016   CO2 29 12/02/2018   GLUCOSE 95 12/02/2018   BUN 19 12/02/2018   CREATININE 0.93 12/02/2018   BILITOT 0.3 12/02/2018   ALKPHOS 87 12/02/2018   AST 39 12/02/2018   ALT 29 12/02/2018   PROT 7.3 12/02/2018   ALBUMIN 4.1 12/02/2018   CALCIUM 8.8 (L) 12/02/2018   ANIONGAP 7 12/02/2018   EGFR 80 (L) 11/29/2016   GFR 62.50 05/06/2018   Lab Results  Component Value Date   CHOL 236 (H) 05/06/2018   Lab Results  Component Value Date   HDL 59.00 05/06/2018   Lab Results  Component Value Date   LDLCALC 82 02/12/2017   Lab Results  Component Value Date   TRIG 322.0 (H) 05/06/2018   Lab Results  Component Value Date   CHOLHDL 4 05/06/2018   Lab Results  Component Value Date   HGBA1C 5.0 08/10/2017       Assessment & Plan:   Problem List Items Addressed This Visit    Depression with anxiety    Is managing the stress of the pandemic with the support of her family. Her son is a Airline pilot so she worries about him quite a bit. Does not feel she needs a change in treatment in this regard.       Secondary myelofibrosis ([place]) (Chronic)    Continues to follow closely with oncology and they continue to find the right medicaiton regimen for her.       Hyperglycemia    hgba1c acceptable, minimize simple carbs. Increase exercise as  tolerated.       Vitamin D deficiency    Supplement and monitor      Essential hypertension     no changes to meds. Encouraged heart healthy diet such as the DASH diet and exercise as tolerated.       Chronic back pain    Managing her pain well with current meds.       Relevant Medications   oxyCODONE-acetaminophen (PERCOCET) 10-325 MG tablet      I am having Jason D. Chevez maintain her Multiple Vitamin (MULTIVITAMIN PO), aspirin EC, Cranberry, budesonide-formoterol,  magic mouthwash, nystatin, silver sulfADIAZINE, cetirizine, fluticasone, montelukast, fluconazole, ALPRAZolam, atorvastatin, estradiol, busPIRone, famotidine, ondansetron, venlafaxine XR, TUBERCULIN SYR 1CC/27GX1/2", peginterferon alfa-2a, albuterol, albuterol, granisetron, cyclobenzaprine, chlorpheniramine, Restasis, cyclobenzaprine, gabapentin, furosemide, meclizine, Euthyrox, traZODone, and oxyCODONE-acetaminophen.  Meds ordered this encounter  Medications  . oxyCODONE-acetaminophen (PERCOCET) 10-325 MG tablet    Sig: Take 1 tablet by mouth every 6 (six) hours as needed for pain.    Dispense:  120 tablet    Refill:  0     I discussed the assessment and treatment plan with the patient. The patient was provided an opportunity to ask questions and all were answered. The patient agreed with the plan and demonstrated an understanding of the instructions.   The patient was advised to call back or seek an in-person evaluation if the symptoms worsen or if the condition fails to improve as anticipated.  I provided 25 minutes of non-face-to-face time during this encounter.   Penni Homans, MD

## 2018-12-04 NOTE — Assessment & Plan Note (Signed)
Supplement and monitor 

## 2018-12-04 NOTE — Assessment & Plan Note (Signed)
hgba1c acceptable, minimize simple carbs. Increase exercise as tolerated.  

## 2018-12-06 ENCOUNTER — Telehealth: Payer: Self-pay | Admitting: *Deleted

## 2018-12-06 NOTE — Telephone Encounter (Signed)
Received Home Health Discharge-Transfer Summary for review from [place]; forwarded to provider/SLS 05/08

## 2018-12-11 ENCOUNTER — Other Ambulatory Visit: Payer: Self-pay | Admitting: Family Medicine

## 2018-12-12 ENCOUNTER — Other Ambulatory Visit: Payer: Self-pay

## 2018-12-12 MED ORDER — GABAPENTIN 400 MG PO CAPS: 400.0000 mg | ORAL_CAPSULE | Freq: Every day | ORAL | 1 refills | Status: DC

## 2018-12-16 ENCOUNTER — Telehealth: Payer: Self-pay | Admitting: *Deleted

## 2018-12-16 NOTE — Telephone Encounter (Signed)
Message received from patient stating that she needs clearance for cataract surgery on 12/27/18.  Call placed back to patient and patient states that cataract surgery will not be done on 12/27/18, but that she has initial appt with her eye dr for cataract surgery on 12/27/18. Pt given office fax number and instructed to give number to her surgeon to fax Dr. Marin Olp clearance letter.  Pt appreciative of call back and has no further questions or concerns at this time.

## 2018-12-20 MED FILL — BD TB SYRINGE 27GX1/2: 27G X 1/2" | 28 days supply | Qty: 4 | Fill #3

## 2018-12-20 MED FILL — PEGASYS 180 MCG/ML VIAL: 180 | 28 days supply | Qty: 4 | Fill #3

## 2018-12-20 MED FILL — BD TB SYRINGE 27GX1/2": 27G X 1/2" | 28 days supply | Qty: 4 | Fill #3

## 2018-12-27 ENCOUNTER — Other Ambulatory Visit: Payer: Self-pay | Admitting: Family Medicine

## 2018-12-27 DIAGNOSIS — H04123 Dry eye syndrome of bilateral lacrimal glands: Secondary | ICD-10-CM | POA: Diagnosis not present

## 2018-12-27 DIAGNOSIS — H2512 Age-related nuclear cataract, left eye: Secondary | ICD-10-CM | POA: Diagnosis not present

## 2018-12-27 DIAGNOSIS — H2511 Age-related nuclear cataract, right eye: Secondary | ICD-10-CM | POA: Diagnosis not present

## 2018-12-27 DIAGNOSIS — R739 Hyperglycemia, unspecified: Secondary | ICD-10-CM

## 2018-12-27 DIAGNOSIS — R300 Dysuria: Secondary | ICD-10-CM

## 2018-12-27 DIAGNOSIS — G8929 Other chronic pain: Secondary | ICD-10-CM

## 2018-12-27 DIAGNOSIS — E559 Vitamin D deficiency, unspecified: Secondary | ICD-10-CM

## 2018-12-27 DIAGNOSIS — Z01818 Encounter for other preprocedural examination: Secondary | ICD-10-CM | POA: Diagnosis not present

## 2018-12-27 DIAGNOSIS — I10 Essential (primary) hypertension: Secondary | ICD-10-CM

## 2018-12-30 ENCOUNTER — Other Ambulatory Visit: Payer: Self-pay

## 2018-12-30 ENCOUNTER — Encounter: Payer: Self-pay | Admitting: Hematology & Oncology

## 2018-12-30 ENCOUNTER — Inpatient Hospital Stay: Payer: Medicare Other

## 2018-12-30 ENCOUNTER — Inpatient Hospital Stay: Payer: Medicare Other | Attending: Hematology & Oncology | Admitting: Hematology & Oncology

## 2018-12-30 ENCOUNTER — Telehealth: Payer: Self-pay | Admitting: Hematology & Oncology

## 2018-12-30 VITALS — BP 137/80 | HR 92 | Temp 98.4°F | Resp 16 | Wt 157.0 lb

## 2018-12-30 DIAGNOSIS — F419 Anxiety disorder, unspecified: Secondary | ICD-10-CM

## 2018-12-30 DIAGNOSIS — Z7982 Long term (current) use of aspirin: Secondary | ICD-10-CM | POA: Diagnosis not present

## 2018-12-30 DIAGNOSIS — Z79899 Other long term (current) drug therapy: Secondary | ICD-10-CM

## 2018-12-30 DIAGNOSIS — D473 Essential (hemorrhagic) thrombocythemia: Secondary | ICD-10-CM

## 2018-12-30 DIAGNOSIS — D75839 Thrombocytosis, unspecified: Secondary | ICD-10-CM

## 2018-12-30 DIAGNOSIS — E032 Hypothyroidism due to medicaments and other exogenous substances: Secondary | ICD-10-CM | POA: Insufficient documentation

## 2018-12-30 DIAGNOSIS — R110 Nausea: Secondary | ICD-10-CM | POA: Diagnosis not present

## 2018-12-30 DIAGNOSIS — F329 Major depressive disorder, single episode, unspecified: Secondary | ICD-10-CM | POA: Diagnosis not present

## 2018-12-30 DIAGNOSIS — D7581 Myelofibrosis: Secondary | ICD-10-CM | POA: Diagnosis not present

## 2018-12-30 DIAGNOSIS — Z7951 Long term (current) use of inhaled steroids: Secondary | ICD-10-CM

## 2018-12-30 DIAGNOSIS — D509 Iron deficiency anemia, unspecified: Secondary | ICD-10-CM

## 2018-12-30 DIAGNOSIS — M7989 Other specified soft tissue disorders: Secondary | ICD-10-CM | POA: Diagnosis not present

## 2018-12-30 DIAGNOSIS — R5383 Other fatigue: Secondary | ICD-10-CM | POA: Diagnosis not present

## 2018-12-30 DIAGNOSIS — E039 Hypothyroidism, unspecified: Secondary | ICD-10-CM

## 2018-12-30 DIAGNOSIS — D500 Iron deficiency anemia secondary to blood loss (chronic): Secondary | ICD-10-CM

## 2018-12-30 LAB — CMP (CANCER CENTER ONLY)
ALT: 28 U/L (ref 0–44)
AST: 38 U/L (ref 15–41)
Albumin: 4.4 g/dL (ref 3.5–5.0)
Alkaline Phosphatase: 89 U/L (ref 38–126)
Anion gap: 8 (ref 5–15)
BUN: 11 mg/dL (ref 8–23)
CO2: 32 mmol/L (ref 22–32)
Calcium: 9 mg/dL (ref 8.9–10.3)
Chloride: 101 mmol/L (ref 98–111)
Creatinine: 0.97 mg/dL (ref 0.44–1.00)
GFR, Est AFR Am: >60 mL/min (ref >60)
GFR, Estimated: >60 mL/min (ref >60)
Glucose, Bld: 104 mg/dL — ABNORMAL HIGH (ref 70–99)
Potassium: 3.6 mmol/L (ref 3.5–5.1)
Sodium: 141 mmol/L (ref 135–145)
Total Bilirubin: 0.4 mg/dL (ref 0.3–1.2)
Total Protein: 7.9 g/dL (ref 6.5–8.1)

## 2018-12-30 LAB — CBC WITH DIFFERENTIAL (CANCER CENTER ONLY)
Abs Immature Granulocytes: 0.01 10*3/uL (ref 0.00–0.07)
Basophils Absolute: 0 10*3/uL (ref 0.0–0.1)
Basophils Relative: 0 %
Eosinophils Absolute: 0.1 10*3/uL (ref 0.0–0.5)
Eosinophils Relative: 3 %
HCT: 34.2 % — ABNORMAL LOW (ref 36.0–46.0)
Hemoglobin: 10.8 g/dL — ABNORMAL LOW (ref 12.0–15.0)
Immature Granulocytes: 0 %
Lymphocytes Relative: 17 %
Lymphs Abs: 0.8 10*3/uL (ref 0.7–4.0)
MCH: 34 pg (ref 26.0–34.0)
MCHC: 31.6 g/dL (ref 30.0–36.0)
MCV: 107.5 fL — ABNORMAL HIGH (ref 80.0–100.0)
Monocytes Absolute: 0.4 10*3/uL (ref 0.1–1.0)
Monocytes Relative: 8 %
Neutro Abs: 3.4 10*3/uL (ref 1.7–7.7)
Neutrophils Relative %: 72 %
Platelet Count: 645 10*3/uL — ABNORMAL HIGH (ref 150–400)
RBC: 3.18 MIL/uL — ABNORMAL LOW (ref 3.87–5.11)
RDW: 15.9 % — ABNORMAL HIGH (ref 11.5–15.5)
WBC Count: 4.7 10*3/uL (ref 4.0–10.5)
nRBC: 0 % (ref 0.0–0.2)

## 2018-12-30 LAB — SAVE SMEAR (SSMR)

## 2018-12-30 LAB — LACTATE DEHYDROGENASE: LDH: 282 U/L — ABNORMAL HIGH (ref 98–192)

## 2018-12-30 NOTE — Telephone Encounter (Signed)
lmom to inform pt of 6/22 appt at 11 am per 6/1 los

## 2018-12-30 NOTE — Progress Notes (Signed)
Hematology and Oncology Follow Up Visit  Rachael Johnson [id number] 07-20-1951 68 y.o. 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  Unfortunately, her blood count is back up.  The platelet count is now 645,000.  She is doing the interferon weekly.  I told her that we have a lot of room to move on the interferon and that we can always increase the dose.  She has had denies much problems with fatigue and weakness.  She actually sounds a lot better.  She is more active.  She has had no fever.  She says a Sancuso patch really does help with the nausea.  She has had no diarrhea.  There is been no headache.  She has had no rashes.  Overall, her performance status is ECOG 1.    Medications:  Allergies as of 12/30/2018   No Known Allergies     Medication List       Accurate as of December 30, 2018 11:45 AM. If you have any questions, ask your nurse or doctor.        STOP taking these medications   aspirin EC 81 MG tablet Stopped by:  Rachael Napoleon, MD     TAKE these medications   albuterol 108 (90 Base) MCG/ACT inhaler Commonly known as:  VENTOLIN HFA INHALE 2 PUFFS BY MOUTH EVERY 6 HOURS AS NEEDED FOR WHEEZING OR SHORTNESS OF BREATH   ALPRAZolam 1 MG tablet Commonly known as:  XANAX TAKE 1/2 TO 1 (ONE-HALF TO ONE) TABLET BY MOUTH THREE TIMES DAILY AS NEEDED FOR ANXIETY   atorvastatin 10 MG tablet Commonly known as:  LIPITOR Take 1 tablet (10 mg total) by mouth daily.   budesonide-formoterol 80-4.5 MCG/ACT inhaler Commonly known as:  Symbicort Inhale 2 puffs into  the lungs 2 (two) times daily.   busPIRone 15 MG tablet Commonly known as:  BUSPAR Take 1 tablet (15 mg total) by mouth 4 (four) times daily as needed.   cetirizine 10 MG tablet Commonly known as:  ZYRTEC Take 1 tablet (10 mg total) by mouth daily.   chlorpheniramine 4 MG tablet Commonly known as:  CHLOR-TRIMETON Take 2 tablets (8 mg total) by mouth at bedtime as needed for allergies.   Cranberry 300 MG tablet Take 300 mg by mouth 2 (two) times daily.   cyclobenzaprine 10 MG tablet Commonly known as:  FLEXERIL TAKE 1 TABLET BY MOUTH TWICE DAILY AS NEEDED FOR MUSCLE SPASM   estradiol 0.5 MG tablet Commonly known as:  ESTRACE Take 1 tablet (0.5 mg total) by mouth 2 (two) times daily.   Euthyrox 100 MCG tablet Generic drug:  levothyroxine Take 100 mcg by mouth daily.   famotidine 40 MG tablet Commonly known as:  PEPCID Take 1 tablet (40 mg total) by mouth daily.   fluconazole 150 MG tablet Commonly known as:  DIFLUCAN Take 1 tablet (150 mg total) by mouth once a week.   fluticasone 50 MCG/ACT nasal spray Commonly known as:  FLONASE Place 2 sprays into both nostrils daily.   furosemide 20 MG tablet Commonly known as:  LASIX  TAKE 1 TABLET BY MOUTH THREE TIMES DAILY AS NEEDED FOR  FLIUD  OR  EDEMA   gabapentin 400 MG capsule Commonly known as:  NEURONTIN Take 1 capsule (400 mg total) by mouth daily.   granisetron 3.1 MG/24HR Commonly known as:  SANCUSO Apply to skin starting 24 hours before chemotherapy. Remove after 7 days.   magic mouthwash Soln Take 5 mLs by mouth 3 (three) times daily as needed for mouth pain. Components benadryl  525 mg, hydrocortisone 60 mg and nystatin 0.6 mg. 240 ml - Oral   meclizine 12.5 MG tablet Commonly known as:  ANTIVERT TAKE 1 TABLET BY MOUTH THREE TIMES DAILY AS NEEDED FOR DIZZINESS   montelukast 10 MG tablet Commonly known as:  SINGULAIR Take 1 tablet (10 mg total) by mouth at bedtime.   MULTIVITAMIN PO Take 1 tablet by  mouth every morning.   nystatin 100000 UNIT/ML suspension Commonly known as:  MYCOSTATIN   ondansetron 8 MG tablet Commonly known as:  ZOFRAN TAKE 1/2 TO 1 TABLET BY MOUTH EVERY 8 HOURS AS NEEDED FOR NAUSEA AND VOMITING   oxyCODONE-acetaminophen 10-325 MG tablet Commonly known as:  PERCOCET Take 1 tablet by mouth every 6 (six) hours as needed for pain.   peginterferon alfa-2a 180 MCG/ML injection Commonly known as:  PEGASYS Inject 45 mcg into the skin every 7 (seven) days.   Restasis 0.05 % ophthalmic emulsion Generic drug:  cycloSPORINE PLACE ONE DROP INTO EACH EYE EVERY 12 HOURS   silver sulfADIAZINE 1 % cream Commonly known as:  SILVADENE Apply 1 application topically daily.   traZODone 50 MG tablet Commonly known as:  DESYREL Take 1 tablet (50 mg total) by mouth at bedtime.   TUBERCULIN SYR 1CC/27GX1/2" 27G X 1/2" 1 ML Misc Use as directed once weekly for peg-interferon injections   venlafaxine XR 150 MG 24 hr capsule Commonly known as:  EFFEXOR-XR Take 1 capsule (150 mg total) by mouth daily with breakfast.       Allergies: No Known Allergies  Past Medical History, Surgical history, Social history, and Family History were reviewed and updated.  Review of Systems: Review of Systems  Constitutional: Positive for diaphoresis and malaise/fatigue.  HENT: Negative.   Eyes: Negative.   Respiratory: Positive for shortness of breath.   Cardiovascular: Negative.   Gastrointestinal: Positive for abdominal pain, heartburn and nausea.  Genitourinary: Negative.   Musculoskeletal: Positive for joint pain and myalgias.  Skin: Negative.   Neurological: Positive for dizziness.  Endo/Heme/Allergies: Negative.   Psychiatric/Behavioral: Negative.      Physical Exam:  weight is 157 lb (71.2 kg). Her oral temperature is 98.4 F (36.9 C). Her blood pressure is 137/80 and her pulse is 92. Her respiration is 16 and oxygen saturation is 100%.   Wt Readings from Last 3  Encounters:  12/30/18 157 lb (71.2 kg)  12/02/18 159 lb (72.1 kg)  11/11/18 162 lb (73.5 kg)    Physical Exam Vitals signs reviewed.  HENT:     Head: Normocephalic and atraumatic.  Eyes:     Pupils: Pupils are equal, round, and reactive to light.  Neck:     Musculoskeletal: Normal range of motion.  Cardiovascular:     Rate and Rhythm: Normal rate and regular rhythm.     Heart sounds: Normal heart sounds.  Pulmonary:     Effort: Pulmonary effort is normal.     Breath sounds: Normal breath sounds.  Abdominal:     General: Bowel sounds are normal.  Palpations: Abdomen is soft.     Comments: Her spleen tip is palpable a couple centimeters below the left costal margin.  Musculoskeletal: Normal range of motion.        General: No tenderness or deformity.  Lymphadenopathy:     Cervical: No cervical adenopathy.  Skin:    General: Skin is warm and dry.     Findings: No erythema or rash.  Neurological:     Mental Status: She is alert and oriented to person, place, and time.  Psychiatric:        Behavior: Behavior normal.        Thought Content: Thought content normal.        Judgment: Judgment normal.      Lab Results  Component Value Date   WBC 4.7 12/30/2018   HGB 10.8 (L) 12/30/2018   HCT 34.2 (L) 12/30/2018   MCV 107.5 (H) 12/30/2018   PLT 645 (H) 12/30/2018   Lab Results  Component Value Date   FERRITIN 2,054 (H) 10/21/2018   IRON 90 10/21/2018   TIBC 195 (L) 10/21/2018   UIBC 105 (L) 10/21/2018   IRONPCTSAT 46 10/21/2018   Lab Results  Component Value Date   RETICCTPCT 1.9 10/21/2018   RBC 3.18 (L) 12/30/2018   RETICCTABS 45.8 06/02/2015   No results found for: KPAFRELGTCHN, LAMBDASER, KAPLAMBRATIO No results found for: IGGSERUM, IGA, IGMSERUM No results found for: Kathrynn Ducking, MSPIKE, SPEI   Chemistry      Component Value Date/Time   NA 141 12/30/2018 1040   NA 143 07/16/2017 1057   NA 140 11/29/2016  0936   K 3.6 12/30/2018 1040   K 3.2 (L) 07/16/2017 1057   K 4.2 11/29/2016 0936   CL 101 12/30/2018 1040   CL 104 07/16/2017 1057   CO2 32 12/30/2018 1040   CO2 25 07/16/2017 1057   CO2 27 11/29/2016 0936   BUN 11 12/30/2018 1040   BUN 10 07/16/2017 1057   BUN 18.3 11/29/2016 0936   CREATININE 0.97 12/30/2018 1040   CREATININE 0.8 07/16/2017 1057   CREATININE 0.8 11/29/2016 0936      Component Value Date/Time   CALCIUM 9.0 12/30/2018 1040   CALCIUM 8.9 07/16/2017 1057   CALCIUM 8.9 11/29/2016 0936   ALKPHOS 89 12/30/2018 1040   ALKPHOS 71 07/16/2017 1057   ALKPHOS 77 11/29/2016 0936   AST 38 12/30/2018 1040   AST 19 11/29/2016 0936   ALT 28 12/30/2018 1040   ALT 27 07/16/2017 1057   ALT 10 11/29/2016 0936   BILITOT 0.4 12/30/2018 1040   BILITOT <0.22 11/29/2016 0936      Impression and Plan: Ms. Kratz is a very pleasant 68 yo caucasian female with essential thrombocythemia.   I will not change the dose of the PEG interferon as of yet.  We will see the platelet count go back down.  If not, then we will have to bump the dose of interferon up a little bit.  I will get her back in 3 weeks.  That will really determine whether or not we have to change the interferon dosing.         Rachael Napoleon, MD 6/1/202011:45 AM

## 2018-12-30 NOTE — Telephone Encounter (Signed)
Pt calling to check on the status of these medications.  States she was told they would be called in today.  Pt would like a call when this has been done.

## 2018-12-31 ENCOUNTER — Telehealth: Payer: Self-pay

## 2018-12-31 ENCOUNTER — Other Ambulatory Visit: Payer: Self-pay | Admitting: Family Medicine

## 2018-12-31 DIAGNOSIS — G8929 Other chronic pain: Secondary | ICD-10-CM

## 2018-12-31 LAB — TSH: TSH: 0.137 u[IU]/mL — ABNORMAL LOW (ref 0.308–3.960)

## 2018-12-31 NOTE — Telephone Encounter (Signed)
Copied from [place] [id number]. Topic: Quick Communication - Rx Refill/Question >> Dec 31, 2018 11:38 AM Alanda Slim E wrote: Medication: oxyCODONE-acetaminophen (PERCOCET) 10-325 MG tablet  Has the patient contacted their pharmacy? Yes - request sent   Preferred Pharmacy (with phone number or street name): [facility], [address] [phone] (Phone) [phone] (Fax)    Agent: Please be advised that RX refills may take up to 3 business days. We ask that you follow-up with your pharmacy.

## 2018-12-31 NOTE — Telephone Encounter (Signed)
Copied from [place] [id number]. Topic: Appointment Scheduling - Scheduling Inquiry for Clinic >> Dec 30, 2018  5:18 PM Nils Flack wrote: Reason for CRM: pt woul dlike to know when she is due for follow up appt  Plesase call back

## 2019-01-01 ENCOUNTER — Telehealth: Payer: Self-pay | Admitting: Family Medicine

## 2019-01-01 MED ORDER — OXYCODONE-ACETAMINOPHEN 10-325 MG PO TABS: 1.0000 | ORAL_TABLET | Freq: Four times a day (QID) | ORAL | 0 refills | Status: DC | PRN

## 2019-01-01 NOTE — Telephone Encounter (Signed)
Again will not eprescribe. Imprivata proble. Whey will have to pick up if this does not get better by tomorrow

## 2019-01-01 NOTE — Telephone Encounter (Signed)
Requesting:percocet Contract:yes UDS:n/a Last OV:12/02/18 Next OV:n/a Last Refill:12/02/18 #[id number] Database:   Please advise

## 2019-01-01 NOTE — Telephone Encounter (Signed)
Cop[place] [id number]. Topic: Quick Communication - Rx Refill/Question >> Jan 01, 2019  3:20 PM Leward Quan A wrote: Medication: oxyCODONE-acetaminophen (PERCOCET) 10-325 MG tablet   Has the patient contacted their pharmacy? Yes.   (Agent: If no, request that the patient contact the pharmacy for the refill.) (Agent: If yes, when and what did the pharmacy advise?)  Preferred Pharmacy (with phone number or street name): [facility], [address] [phone] (Phone) [phone] (Fax)    Agent: Please be advised that RX refills may take up to 3 business days. We ask that you follow-up with your pharmacy.

## 2019-01-01 NOTE — Telephone Encounter (Signed)
Patient calling to check status of refill.  °

## 2019-01-01 NOTE — Telephone Encounter (Signed)
Please schedule patient 6 weeks out Per her last visit on 12/02/18

## 2019-01-01 NOTE — Telephone Encounter (Signed)
appt scheduled

## 2019-01-02 ENCOUNTER — Other Ambulatory Visit: Payer: Self-pay | Admitting: Family Medicine

## 2019-01-02 DIAGNOSIS — R739 Hyperglycemia, unspecified: Secondary | ICD-10-CM

## 2019-01-02 DIAGNOSIS — E559 Vitamin D deficiency, unspecified: Secondary | ICD-10-CM

## 2019-01-02 DIAGNOSIS — I10 Essential (primary) hypertension: Secondary | ICD-10-CM

## 2019-01-02 DIAGNOSIS — R300 Dysuria: Secondary | ICD-10-CM

## 2019-01-02 NOTE — Telephone Encounter (Signed)
Medication was sent in   

## 2019-01-03 NOTE — Telephone Encounter (Signed)
meds sent in

## 2019-01-17 DIAGNOSIS — H2512 Age-related nuclear cataract, left eye: Secondary | ICD-10-CM | POA: Diagnosis not present

## 2019-01-17 DIAGNOSIS — H25812 Combined forms of age-related cataract, left eye: Secondary | ICD-10-CM | POA: Diagnosis not present

## 2019-01-20 ENCOUNTER — Encounter: Payer: Self-pay | Admitting: Hematology & Oncology

## 2019-01-20 ENCOUNTER — Inpatient Hospital Stay (HOSPITAL_BASED_OUTPATIENT_CLINIC_OR_DEPARTMENT_OTHER): Payer: Medicare Other | Admitting: Hematology & Oncology

## 2019-01-20 ENCOUNTER — Inpatient Hospital Stay: Payer: Medicare Other

## 2019-01-20 ENCOUNTER — Other Ambulatory Visit: Payer: Self-pay

## 2019-01-20 VITALS — BP 117/79 | HR 117 | Temp 98.7°F | Resp 19 | Wt 159.0 lb

## 2019-01-20 DIAGNOSIS — D473 Essential (hemorrhagic) thrombocythemia: Secondary | ICD-10-CM | POA: Diagnosis not present

## 2019-01-20 DIAGNOSIS — F419 Anxiety disorder, unspecified: Secondary | ICD-10-CM

## 2019-01-20 DIAGNOSIS — Z7951 Long term (current) use of inhaled steroids: Secondary | ICD-10-CM

## 2019-01-20 DIAGNOSIS — D7581 Myelofibrosis: Secondary | ICD-10-CM | POA: Diagnosis not present

## 2019-01-20 DIAGNOSIS — D75839 Thrombocytosis, unspecified: Secondary | ICD-10-CM

## 2019-01-20 DIAGNOSIS — R5383 Other fatigue: Secondary | ICD-10-CM | POA: Diagnosis not present

## 2019-01-20 DIAGNOSIS — M7989 Other specified soft tissue disorders: Secondary | ICD-10-CM

## 2019-01-20 DIAGNOSIS — R110 Nausea: Secondary | ICD-10-CM | POA: Diagnosis not present

## 2019-01-20 DIAGNOSIS — D500 Iron deficiency anemia secondary to blood loss (chronic): Secondary | ICD-10-CM | POA: Diagnosis not present

## 2019-01-20 DIAGNOSIS — Z7982 Long term (current) use of aspirin: Secondary | ICD-10-CM | POA: Diagnosis not present

## 2019-01-20 DIAGNOSIS — D509 Iron deficiency anemia, unspecified: Secondary | ICD-10-CM | POA: Diagnosis not present

## 2019-01-20 DIAGNOSIS — E032 Hypothyroidism due to medicaments and other exogenous substances: Secondary | ICD-10-CM

## 2019-01-20 DIAGNOSIS — Z79899 Other long term (current) drug therapy: Secondary | ICD-10-CM

## 2019-01-20 DIAGNOSIS — F329 Major depressive disorder, single episode, unspecified: Secondary | ICD-10-CM

## 2019-01-20 LAB — CMP (CANCER CENTER ONLY)
ALT: 28 U/L (ref 0–44)
AST: 40 U/L (ref 15–41)
Albumin: 4.2 g/dL (ref 3.5–5.0)
Alkaline Phosphatase: 96 U/L (ref 38–126)
Anion gap: 7 (ref 5–15)
BUN: 14 mg/dL (ref 8–23)
CO2: 30 mmol/L (ref 22–32)
Calcium: 9.3 mg/dL (ref 8.9–10.3)
Chloride: 101 mmol/L (ref 98–111)
Creatinine: 0.82 mg/dL (ref 0.44–1.00)
GFR, Est AFR Am: >60 mL/min (ref >60)
GFR, Estimated: >60 mL/min (ref >60)
Glucose, Bld: 99 mg/dL (ref 70–99)
Potassium: 3.5 mmol/L (ref 3.5–5.1)
Sodium: 138 mmol/L (ref 135–145)
Total Bilirubin: 0.4 mg/dL (ref 0.3–1.2)
Total Protein: 7.4 g/dL (ref 6.5–8.1)

## 2019-01-20 LAB — CBC WITH DIFFERENTIAL (CANCER CENTER ONLY)
Abs Immature Granulocytes: 0.03 10*3/uL (ref 0.00–0.07)
Basophils Absolute: 0 10*3/uL (ref 0.0–0.1)
Basophils Relative: 1 %
Eosinophils Absolute: 0.2 10*3/uL (ref 0.0–0.5)
Eosinophils Relative: 6 %
HCT: 35.7 % — ABNORMAL LOW (ref 36.0–46.0)
Hemoglobin: 11.5 g/dL — ABNORMAL LOW (ref 12.0–15.0)
Immature Granulocytes: 1 %
Lymphocytes Relative: 31 %
Lymphs Abs: 1.1 10*3/uL (ref 0.7–4.0)
MCH: 34.4 pg — ABNORMAL HIGH (ref 26.0–34.0)
MCHC: 32.2 g/dL (ref 30.0–36.0)
MCV: 106.9 fL — ABNORMAL HIGH (ref 80.0–100.0)
Monocytes Absolute: 0.3 10*3/uL (ref 0.1–1.0)
Monocytes Relative: 8 %
Neutro Abs: 2 10*3/uL (ref 1.7–7.7)
Neutrophils Relative %: 53 %
Platelet Count: 597 10*3/uL — ABNORMAL HIGH (ref 150–400)
RBC: 3.34 MIL/uL — ABNORMAL LOW (ref 3.87–5.11)
RDW: 15 % (ref 11.5–15.5)
WBC Count: 3.6 10*3/uL — ABNORMAL LOW (ref 4.0–10.5)
nRBC: 0 % (ref 0.0–0.2)

## 2019-01-20 LAB — LACTATE DEHYDROGENASE: LDH: 248 U/L — ABNORMAL HIGH (ref 98–192)

## 2019-01-20 NOTE — Progress Notes (Signed)
Hematology and Oncology Follow Up Visit  Rachael Johnson [id number] March 13, 1951 68 y.o. 01/20/2019   Principle Diagnosis:  Post-ET myelofibrosis Essential thrombocythemia - JAK2 negative -- NGS (-) Intermittent iron-deficiency anemia  Past Therapy: Hydrea 1000/1000/500 mg by mouth daily - on hold starting 05/14/2017 - DC'd on 05/28/2017  Current Therapy:     Aspirin 81 mg by mouth daily IV iron as indicated - last dose given on 08/16/2018 PEG-Interferon 45 mcg sq q week -- start 10/07/2018   HISTORY: Rachael Johnson is back for follow-up.  She is doing a little better.  She seems to be tolerating the interferon a little bit better.  She had cataract surgery on Friday.  Her next cataract surgery will be in about a month.  She has had no bleeding.  There has been no problems with cough or shortness of breath.  Her appetite is doing okay.  She has had no problems with bowels or bladder.  Does have a little bit of swelling in her legs.  There is been no rashes.  We are watching her thyroid.  We checked her TSH 3 weeks ago.  It was normal at 0.137  Overall, her performance status is ECOG 1.    Medications:  Allergies as of 01/20/2019   No Known Allergies     Medication List       Accurate as of January 20, 2019 12:16 PM. If you have any questions, ask your nurse or doctor.        albuterol 108 (90 Base) MCG/ACT inhaler Commonly known as: VENTOLIN HFA INHALE 2 PUFFS BY MOUTH EVERY 6 HOURS AS NEEDED FOR WHEEZING OR SHORTNESS OF BREATH   ALPRAZolam 1 MG tablet Commonly known as: XANAX TAKE 1/2 TO 1 (ONE-HALF TO ONE) TABLET BY MOUTH THREE TIMES DAILY AS NEEDED FOR ANXIETY   atorvastatin 10 MG tablet Commonly known as: LIPITOR Take 1 tablet (10 mg total) by mouth daily.   budesonide-formoterol 80-4.5 MCG/ACT inhaler Commonly known as: Symbicort Inhale 2 puffs into the lungs 2 (two) times daily.   busPIRone 15 MG tablet Commonly known as: BUSPAR Take 1 tablet (15 mg total)  by mouth 4 (four) times daily as needed.   cetirizine 10 MG tablet Commonly known as: ZYRTEC Take 1 tablet (10 mg total) by mouth daily.   chlorpheniramine 4 MG tablet Commonly known as: CHLOR-TRIMETON Take 2 tablets (8 mg total) by mouth at bedtime as needed for allergies.   Cranberry 300 MG tablet Take 300 mg by mouth 2 (two) times daily.   cyclobenzaprine 10 MG tablet Commonly known as: FLEXERIL TAKE 1 TABLET BY MOUTH TWICE DAILY AS NEEDED FOR MUSCLE SPASM   cyclobenzaprine 10 MG tablet Commonly known as: FLEXERIL TAKE 1 TABLET BY MOUTH TWICE DAILY AS NEEDED FOR MUSCLE SPASM   estradiol 0.5 MG tablet Commonly known as: ESTRACE Take 1 tablet (0.5 mg total) by mouth 2 (two) times daily.   Euthyrox 100 MCG tablet Generic drug: levothyroxine TAKE 1 TABLET BY MOUTH ONCE DAILY BEFORE BREAKFAST   famotidine 40 MG tablet Commonly known as: PEPCID Take 1 tablet (40 mg total) by mouth daily.   fluconazole 150 MG tablet Commonly known as: DIFLUCAN Take 1 tablet (150 mg total) by mouth once a week.   fluticasone 50 MCG/ACT nasal spray Commonly known as: FLONASE Place 2 sprays into both nostrils daily.   furosemide 20 MG tablet Commonly known as: LASIX TAKE 1 TABLET BY MOUTH THREE TIMES DAILY AS NEEDED FOR FLUID OR  EDEMA   gabapentin 400 MG capsule Commonly known as: NEURONTIN Take 1 capsule (400 mg total) by mouth daily.   granisetron 3.1 MG/24HR Commonly known as: SANCUSO Apply to skin starting 24 hours before chemotherapy. Remove after 7 days.   magic mouthwash Soln Take 5 mLs by mouth 3 (three) times daily as needed for mouth pain. Components benadryl  525 mg, hydrocortisone 60 mg and nystatin 0.6 mg. 240 ml - Oral   meclizine 12.5 MG tablet Commonly known as: ANTIVERT TAKE 1 TABLET BY MOUTH THREE TIMES DAILY AS NEEDED FOR DIZZINESS   montelukast 10 MG tablet Commonly known as: SINGULAIR Take 1 tablet (10 mg total) by mouth at bedtime.   MULTIVITAMIN PO Take  1 tablet by mouth every morning.   nystatin 100000 UNIT/ML suspension Commonly known as: MYCOSTATIN   ondansetron 8 MG tablet Commonly known as: ZOFRAN TAKE 1/2 TO 1 TABLET BY MOUTH EVERY 8 HOURS AS NEEDED FOR NAUSEA AND VOMITING   oxyCODONE-acetaminophen 10-325 MG tablet Commonly known as: PERCOCET Take 1 tablet by mouth every 6 (six) hours as needed for pain.   peginterferon alfa-2a 180 MCG/ML injection Commonly known as: PEGASYS Inject 45 mcg into the skin every 7 (seven) days.   Restasis 0.05 % ophthalmic emulsion Generic drug: cycloSPORINE PLACE ONE DROP INTO EACH EYE EVERY 12 HOURS   SSD 1 % cream Generic drug: silver sulfADIAZINE APPLY TOPICALLY DAILY   traZODone 50 MG tablet Commonly known as: DESYREL Take 1 tablet (50 mg total) by mouth at bedtime.   TUBERCULIN SYR 1CC/27GX1/2" 27G X 1/2" 1 ML Misc Use as directed once weekly for peg-interferon injections   venlafaxine XR 150 MG 24 hr capsule Commonly known as: EFFEXOR-XR Take 1 capsule (150 mg total) by mouth daily with breakfast.       Allergies: No Known Allergies  Past Medical History, Surgical history, Social history, and Family History were reviewed and updated.  Review of Systems: Review of Systems  Constitutional: Positive for diaphoresis and malaise/fatigue.  HENT: Negative.   Eyes: Negative.   Respiratory: Positive for shortness of breath.   Cardiovascular: Negative.   Gastrointestinal: Positive for abdominal pain, heartburn and nausea.  Genitourinary: Negative.   Musculoskeletal: Positive for joint pain and myalgias.  Skin: Negative.   Neurological: Positive for dizziness.  Endo/Heme/Allergies: Negative.   Psychiatric/Behavioral: Negative.      Physical Exam:  weight is 159 lb (72.1 kg). Her oral temperature is 98.7 F (37.1 C). Her blood pressure is 117/79 and her pulse is 117 (abnormal). Her respiration is 19 and oxygen saturation is 98%.   Wt Readings from Last 3 Encounters:   01/20/19 159 lb (72.1 kg)  12/30/18 157 lb (71.2 kg)  12/02/18 159 lb (72.1 kg)    Physical Exam Vitals signs reviewed.  HENT:     Head: Normocephalic and atraumatic.  Eyes:     Pupils: Pupils are equal, round, and reactive to light.  Neck:     Musculoskeletal: Normal range of motion.  Cardiovascular:     Rate and Rhythm: Normal rate and regular rhythm.     Heart sounds: Normal heart sounds.  Pulmonary:     Effort: Pulmonary effort is normal.     Breath sounds: Normal breath sounds.  Abdominal:     General: Bowel sounds are normal.     Palpations: Abdomen is soft.     Comments: Her spleen tip is palpable a couple centimeters below the left costal margin.  Musculoskeletal: Normal range of motion.  General: No tenderness or deformity.  Lymphadenopathy:     Cervical: No cervical adenopathy.  Skin:    General: Skin is warm and dry.     Findings: No erythema or rash.  Neurological:     Mental Status: She is alert and oriented to person, place, and time.  Psychiatric:        Behavior: Behavior normal.        Thought Content: Thought content normal.        Judgment: Judgment normal.      Lab Results  Component Value Date   WBC 3.6 (L) 01/20/2019   HGB 11.5 (L) 01/20/2019   HCT 35.7 (L) 01/20/2019   MCV 106.9 (H) 01/20/2019   PLT 597 (H) 01/20/2019   Lab Results  Component Value Date   FERRITIN 2,054 (H) 10/21/2018   IRON 90 10/21/2018   TIBC 195 (L) 10/21/2018   UIBC 105 (L) 10/21/2018   IRONPCTSAT 46 10/21/2018   Lab Results  Component Value Date   RETICCTPCT 1.9 10/21/2018   RBC 3.34 (L) 01/20/2019   RETICCTABS 45.8 06/02/2015   No results found for: KPAFRELGTCHN, LAMBDASER, KAPLAMBRATIO No results found for: IGGSERUM, IGA, IGMSERUM No results found for: Kathrynn Ducking, MSPIKE, SPEI   Chemistry      Component Value Date/Time   NA 138 01/20/2019 1057   NA 143 07/16/2017 1057   NA 140 11/29/2016 0936    K 3.5 01/20/2019 1057   K 3.2 (L) 07/16/2017 1057   K 4.2 11/29/2016 0936   CL 101 01/20/2019 1057   CL 104 07/16/2017 1057   CO2 30 01/20/2019 1057   CO2 25 07/16/2017 1057   CO2 27 11/29/2016 0936   BUN 14 01/20/2019 1057   BUN 10 07/16/2017 1057   BUN 18.3 11/29/2016 0936   CREATININE 0.82 01/20/2019 1057   CREATININE 0.8 07/16/2017 1057   CREATININE 0.8 11/29/2016 0936      Component Value Date/Time   CALCIUM 9.3 01/20/2019 1057   CALCIUM 8.9 07/16/2017 1057   CALCIUM 8.9 11/29/2016 0936   ALKPHOS 96 01/20/2019 1057   ALKPHOS 71 07/16/2017 1057   ALKPHOS 77 11/29/2016 0936   AST 40 01/20/2019 1057   AST 19 11/29/2016 0936   ALT 28 01/20/2019 1057   ALT 27 07/16/2017 1057   ALT 10 11/29/2016 0936   BILITOT 0.4 01/20/2019 1057   BILITOT <0.22 11/29/2016 0936      Impression and Plan: Ms. Fate is a very pleasant 68 yo caucasian female with essential thrombocythemia.   I will not change the dose of the PEG interferon as of yet.  We will see the platelet count go back down.  If not, then we will have to bump the dose of interferon up a little bit.  I will get her back in 4 weeks.  Hopefully we will see her platelet count continue to improve.  If not, then we may have to increase the dose of the interferon.       [name], MD 6/22/202012:16 PM

## 2019-01-21 LAB — TSH: TSH: 0.289 u[IU]/mL — ABNORMAL LOW (ref 0.308–3.960)

## 2019-01-23 DIAGNOSIS — H25811 Combined forms of age-related cataract, right eye: Secondary | ICD-10-CM | POA: Diagnosis not present

## 2019-01-23 DIAGNOSIS — H2511 Age-related nuclear cataract, right eye: Secondary | ICD-10-CM | POA: Diagnosis not present

## 2019-01-27 MED FILL — BD TB SYRINGE 27GX1/2: 27G X 1/2" | 28 days supply | Qty: 4 | Fill #4

## 2019-01-27 MED FILL — BD TB SYRINGE 27GX1/2": 27G X 1/2" | 28 days supply | Qty: 4 | Fill #4

## 2019-01-27 MED FILL — PEGASYS 180 MCG/ML VIAL: 180 | 28 days supply | Qty: 4 | Fill #4

## 2019-01-28 ENCOUNTER — Ambulatory Visit (INDEPENDENT_AMBULATORY_CARE_PROVIDER_SITE_OTHER): Payer: Medicare Other | Admitting: Family Medicine

## 2019-01-28 ENCOUNTER — Other Ambulatory Visit: Payer: Self-pay

## 2019-01-28 DIAGNOSIS — E785 Hyperlipidemia, unspecified: Secondary | ICD-10-CM | POA: Diagnosis not present

## 2019-01-28 DIAGNOSIS — D473 Essential (hemorrhagic) thrombocythemia: Secondary | ICD-10-CM

## 2019-01-28 DIAGNOSIS — E559 Vitamin D deficiency, unspecified: Secondary | ICD-10-CM

## 2019-01-28 DIAGNOSIS — G8929 Other chronic pain: Secondary | ICD-10-CM | POA: Diagnosis not present

## 2019-01-28 DIAGNOSIS — D75839 Thrombocytosis, unspecified: Secondary | ICD-10-CM

## 2019-01-28 DIAGNOSIS — I10 Essential (primary) hypertension: Secondary | ICD-10-CM | POA: Diagnosis not present

## 2019-01-28 DIAGNOSIS — M549 Dorsalgia, unspecified: Secondary | ICD-10-CM

## 2019-01-28 DIAGNOSIS — E032 Hypothyroidism due to medicaments and other exogenous substances: Secondary | ICD-10-CM | POA: Diagnosis not present

## 2019-01-28 DIAGNOSIS — R739 Hyperglycemia, unspecified: Secondary | ICD-10-CM | POA: Diagnosis not present

## 2019-01-28 MED ORDER — FAMOTIDINE 40 MG PO TABS: 40.0000 mg | ORAL_TABLET | Freq: Every day | ORAL | 1 refills | Status: DC

## 2019-01-28 MED ORDER — ALPRAZOLAM 1 MG PO TABS: ORAL_TABLET | ORAL | 1 refills | Status: DC

## 2019-01-28 MED ORDER — ESTRADIOL 0.5 MG PO TABS: 0.5000 mg | ORAL_TABLET | Freq: Two times a day (BID) | ORAL | 1 refills | Status: DC

## 2019-01-28 MED ORDER — ATORVASTATIN CALCIUM 10 MG PO TABS: 10.0000 mg | ORAL_TABLET | Freq: Every day | ORAL | 3 refills | Status: DC

## 2019-01-28 MED ORDER — GABAPENTIN 400 MG PO CAPS: 400.0000 mg | ORAL_CAPSULE | Freq: Every day | ORAL | 1 refills | Status: DC

## 2019-01-28 MED ORDER — OXYCODONE-ACETAMINOPHEN 10-325 MG PO TABS: 1.0000 | ORAL_TABLET | Freq: Four times a day (QID) | ORAL | 0 refills | Status: DC | PRN

## 2019-01-28 MED ORDER — CYCLOBENZAPRINE HCL 10 MG PO TABS: 10.0000 mg | ORAL_TABLET | Freq: Every day | ORAL | 2 refills | Status: DC | PRN

## 2019-01-28 MED ORDER — BUSPIRONE HCL 15 MG PO TABS: 15.0000 mg | ORAL_TABLET | Freq: Four times a day (QID) | ORAL | 2 refills | Status: DC | PRN

## 2019-01-28 MED ORDER — CETIRIZINE HCL 10 MG PO TABS: 10.0000 mg | ORAL_TABLET | Freq: Every day | ORAL | 1 refills | Status: DC

## 2019-01-28 NOTE — Assessment & Plan Note (Signed)
Encouraged to check Vital Signs weekly and let us know if any concerns develop, no changes to meds. Encouraged heart healthy diet such as the DASH diet and exercise as tolerated.

## 2019-01-28 NOTE — Assessment & Plan Note (Signed)
hgba1c acceptable, minimize simple carbs. Increase exercise as tolerated.  

## 2019-01-28 NOTE — Progress Notes (Signed)
Virtual Visit via Video Note  I connected with Rachael Johnson on 01/28/19 at 10:00 AM EDT by a video enabled telemedicine application and verified that I am speaking with the correct person using two identifiers.  Location: Patient: home Provider: home   I discussed the limitations of evaluation and management by telemedicine and the availability of in person appointments. The patient expressed understanding and agreed to proceed.    Subjective:    Patient ID: Rachael Johnson, female    DOB: 07/16/1951, 68 y.o.   MRN: [id number]  No chief complaint on file.   HPI Patient is in today for follow up on chronic medical concerns including hypothyroidism, hyperlipidemia, hyperglycemia and more. No recent febrile illness or hospitalizations. No polyuria or polydipsia. Has daily pain but manages with current meds. Is maintaining quarantine with her family. Denies CP/palp/SOB/HA/congestion/fevers/GI or GU c/o. Taking meds as prescribed  Past Medical History:  Diagnosis Date  . Acute renal insufficiency 04/09/2017  . Anemia, iron deficiency 07/08/2012  . Arthritis   . Cataract 12/21/2014  . Depression with anxiety 03/24/2011  . Diverticulitis   . Esophageal reflux 08/17/2013  . Essential thrombocythemia ([place]) 01/24/2011  . Frequent episodic tension-type headache   . Gastroenteritis 12/21/2014  . 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 GERD (gastroesophageal reflux disease)   . H. pylori infection 12/19/2012  . Hyperglycemia 12/17/2015  . Hypertension   . Iron deficiency anemia due to chronic blood loss 04/03/2017  . Osteoarthritis 05/24/2017  . Other and unspecified hyperlipidemia 02/25/2013  . Restless leg syndrome 09/30/2014  . Rhabdomyolysis 02/2017  . Scabies 03/28/2015  . 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 Seizure ([place])    childhood  . Shoulder wound, right, sequela 03/14/2017  . Thyroid disease     Past Surgical History:  Procedure Laterality Date  . ABDOMINAL HYSTERECTOMY     1992  .  BREAST CYST ASPIRATION    . BREAST SURGERY  1992   biopsy, benign. Fibrocystic  . UMBILICAL HERNIA REPAIR N/A 09/25/2012   Procedure: HERNIA REPAIR UMBILICAL ADULT;  Surgeon: Harl Bowie, MD;  Location: [facility];  Service: General;  Laterality: N/A;    Family History  Problem Relation Age of Onset  . Arthritis Mother   . Cancer Mother        ovarian  . Hypertension Mother   . Heart disease Mother        pacer  . Heart failure Mother   . Arthritis Father   . Cancer Father        lung  . Hypertension Father   . Cancer Sister        lung  . Cancer Brother        prostate  . Cancer Brother        lung  . Hypertension Son   . COPD Brother   . Heart disease Brother        died from CHF  . Hypertension Brother   . Cancer Brother        colon  . Alcohol abuse Brother   . Cirrhosis Brother   . Seizures Sister   . Stroke Sister   . Arthritis Brother     Social History   Socioeconomic History  . Marital status: Widowed    Spouse name: Not on file  . Number of children: 2  . Years of education: Not on file  . Highest education level: Not on file  Occupational History  . Occupation: Control and instrumentation engineer  Employer: [employer]    Comment: cotton [employer]  Social Needs  . Financial resource strain: Not on file  . Food insecurity    Worry: Not on file    Inability: Not on file  . Transportation needs    Medical: Not on file    Non-medical: Not on file  Tobacco Use  . Smoking status: Former Smoker    Packs/day: 3.50    Years: 20.00    Pack years: 70.00    Types: Cigarettes    Start date: 11/15/1970    Quit date: 07/31/1990    Years since quitting: 28.5  . Smokeless tobacco: Never Used  . Tobacco comment: quit 23 years ago  Substance and Sexual Activity  . Alcohol use: No    Alcohol/week: 0.0 standard drinks  . Drug use: No  . Sexual activity: Never  Lifestyle  . Physical activity    Days per week: Not on file    Minutes per session: Not on file  . Stress: Not on  file  Relationships  . Social Herbalist on phone: Not on file    Gets together: Not on file    Attends religious service: Not on file    Active member of club or organization: Not on file    Attends meetings of clubs or organizations: Not on file    Relationship status: Not on file  . Intimate partner violence    Fear of current or ex partner: Not on file    Emotionally abused: Not on file    Physically abused: Not on file    Forced sexual activity: Not on file  Other Topics Concern  . Not on file  Social History Narrative   Regular exercise: no   Caffeine Use: 2-3 weekly    Outpatient Medications Prior to Visit  Medication Sig Dispense Refill  . albuterol (PROVENTIL HFA;VENTOLIN HFA) 108 (90 Base) MCG/ACT inhaler INHALE 2 PUFFS BY MOUTH EVERY 6 HOURS AS NEEDED FOR WHEEZING OR SHORTNESS OF BREATH 54 g 0  . budesonide-formoterol (SYMBICORT) 80-4.5 MCG/ACT inhaler Inhale 2 puffs into the lungs 2 (two) times daily. 10.2 g 5  . chlorpheniramine (CHLOR-TRIMETON) 4 MG tablet Take 2 tablets (8 mg total) by mouth at bedtime as needed for allergies. 30 tablet 1  . Cranberry 300 MG tablet Take 300 mg by mouth 2 (two) times daily.    . EUTHYROX 100 MCG tablet TAKE 1 TABLET BY MOUTH ONCE DAILY BEFORE BREAKFAST 90 tablet 0  . fluconazole (DIFLUCAN) 150 MG tablet Take 1 tablet (150 mg total) by mouth once a week. 2 tablet 1  . fluticasone (FLONASE) 50 MCG/ACT nasal spray Place 2 sprays into both nostrils daily. 48 g 3  . furosemide (LASIX) 20 MG tablet TAKE 1 TABLET BY MOUTH THREE TIMES DAILY AS NEEDED FOR FLUID OR EDEMA 90 tablet 0  . granisetron (SANCUSO) 3.1 MG/24HR Apply to skin starting 24 hours before chemotherapy. Remove after 7 days. 4 each 4  . magic mouthwash SOLN Take 5 mLs by mouth 3 (three) times daily as needed for mouth pain. Components benadryl  525 mg, hydrocortisone 60 mg and nystatin 0.6 mg. 240 ml - Oral 240 mL 3  . meclizine (ANTIVERT) 12.5 MG tablet TAKE 1 TABLET  BY MOUTH THREE TIMES DAILY AS NEEDED FOR DIZZINESS 40 tablet 0  . montelukast (SINGULAIR) 10 MG tablet Take 1 tablet (10 mg total) by mouth at bedtime. 90 tablet 2  . Multiple Vitamin (MULTIVITAMIN PO) Take  1 tablet by mouth every morning.     . nystatin (MYCOSTATIN) 100000 UNIT/ML suspension     . ondansetron (ZOFRAN) 8 MG tablet TAKE 1/2 TO 1 TABLET BY MOUTH EVERY 8 HOURS AS NEEDED FOR NAUSEA AND VOMITING 90 tablet 1  . peginterferon alfa-2a (PEGASYS) 180 MCG/ML injection Inject 45 mcg into the skin every 7 (seven) days.    . RESTASIS 0.05 % ophthalmic emulsion PLACE ONE DROP INTO EACH EYE EVERY 12 HOURS 0.4 mL 3  . SSD 1 % cream APPLY TOPICALLY DAILY 50 g 0  . traZODone (DESYREL) 50 MG tablet Take 1 tablet (50 mg total) by mouth at bedtime. 30 tablet 3  . TUBERCULIN SYR 1CC/27GX1/2" 27G X 1/2" 1 ML MISC Use as directed once weekly for peg-interferon injections 4 each 11  . venlafaxine XR (EFFEXOR-XR) 150 MG 24 hr capsule Take 1 capsule (150 mg total) by mouth daily with breakfast. 90 capsule 1  . ALPRAZolam (XANAX) 1 MG tablet TAKE 1/2 TO 1 (ONE-HALF TO ONE) TABLET BY MOUTH THREE TIMES DAILY AS NEEDED FOR ANXIETY 90 tablet 1  . atorvastatin (LIPITOR) 10 MG tablet Take 1 tablet (10 mg total) by mouth daily. 90 tablet 3  . busPIRone (BUSPAR) 15 MG tablet Take 1 tablet (15 mg total) by mouth 4 (four) times daily as needed. 120 tablet 2  . cetirizine (ZYRTEC) 10 MG tablet Take 1 tablet (10 mg total) by mouth daily. 90 tablet 1  . cyclobenzaprine (FLEXERIL) 10 MG tablet TAKE 1 TABLET BY MOUTH TWICE DAILY AS NEEDED FOR MUSCLE SPASM 60 tablet 0  . cyclobenzaprine (FLEXERIL) 10 MG tablet TAKE 1 TABLET BY MOUTH TWICE DAILY AS NEEDED FOR MUSCLE SPASM 60 tablet 0  . estradiol (ESTRACE) 0.5 MG tablet Take 1 tablet (0.5 mg total) by mouth 2 (two) times daily. 180 tablet 1  . famotidine (PEPCID) 40 MG tablet Take 1 tablet (40 mg total) by mouth daily. 90 tablet 1  . gabapentin (NEURONTIN) 400 MG capsule Take  1 capsule (400 mg total) by mouth daily. 90 capsule 1  . oxyCODONE-acetaminophen (PERCOCET) 10-325 MG tablet Take 1 tablet by mouth every 6 (six) hours as needed for pain. 120 tablet 0   No facility-administered medications prior to visit.     No Known Allergies  Review of Systems  Constitutional: Positive for malaise/fatigue. Negative for fever.  HENT: Negative for congestion.   Eyes: Negative for blurred vision.  Respiratory: Negative for shortness of breath.   Cardiovascular: Negative for chest pain, palpitations and leg swelling.  Gastrointestinal: Negative for abdominal pain, blood in stool and nausea.  Genitourinary: Negative for dysuria and frequency.  Musculoskeletal: Positive for back pain and joint pain. Negative for falls.  Skin: Negative for rash.  Neurological: Negative for dizziness, loss of consciousness and headaches.  Endo/Heme/Allergies: Negative for environmental allergies.  Psychiatric/Behavioral: Negative for depression. The patient is nervous/anxious.        Objective:    Physical Exam unable to obtain via telephone visit  BP 117/79 (BP Location: Left Arm, Patient Position: Sitting, Cuff Size: Normal)   Wt 158 lb (71.7 kg)   LMP 07/31/1990   BMI 27.99 kg/m  Wt Readings from Last 3 Encounters:  01/28/19 158 lb (71.7 kg)  01/20/19 159 lb (72.1 kg)  12/30/18 157 lb (71.2 kg)    Diabetic Foot Exam - Simple   No data filed     Lab Results  Component Value Date   WBC 3.6 (L) 01/20/2019  HGB 11.5 (L) 01/20/2019   HCT 35.7 (L) 01/20/2019   PLT 597 (H) 01/20/2019   GLUCOSE 99 01/20/2019   CHOL 236 (H) 05/06/2018   TRIG 322.0 (H) 05/06/2018   HDL 59.00 05/06/2018   LDLDIRECT 95.0 05/06/2018   LDLCALC 82 02/12/2017   ALT 28 01/20/2019   AST 40 01/20/2019   NA 138 01/20/2019   K 3.5 01/20/2019   CL 101 01/20/2019   CREATININE 0.82 01/20/2019   BUN 14 01/20/2019   CO2 30 01/20/2019   TSH 0.289 (L) 01/20/2019   INR 0.94 09/16/2018   HGBA1C 5.0  08/10/2017    Lab Results  Component Value Date   TSH 0.289 (L) 01/20/2019   Lab Results  Component Value Date   WBC 3.6 (L) 01/20/2019   HGB 11.5 (L) 01/20/2019   HCT 35.7 (L) 01/20/2019   MCV 106.9 (H) 01/20/2019   PLT 597 (H) 01/20/2019   Lab Results  Component Value Date   NA 138 01/20/2019   K 3.5 01/20/2019   CHLORIDE 105 11/29/2016   CO2 30 01/20/2019   GLUCOSE 99 01/20/2019   BUN 14 01/20/2019   CREATININE 0.82 01/20/2019   BILITOT 0.4 01/20/2019   ALKPHOS 96 01/20/2019   AST 40 01/20/2019   ALT 28 01/20/2019   PROT 7.4 01/20/2019   ALBUMIN 4.2 01/20/2019   CALCIUM 9.3 01/20/2019   ANIONGAP 7 01/20/2019   EGFR 80 (L) 11/29/2016   GFR 62.50 05/06/2018   Lab Results  Component Value Date   CHOL 236 (H) 05/06/2018   Lab Results  Component Value Date   HDL 59.00 05/06/2018   Lab Results  Component Value Date   LDLCALC 82 02/12/2017   Lab Results  Component Value Date   TRIG 322.0 (H) 05/06/2018   Lab Results  Component Value Date   CHOLHDL 4 05/06/2018   Lab Results  Component Value Date   HGBA1C 5.0 08/10/2017       Assessment & Plan:   Problem List Items Addressed This Visit    Hypothyroid    On Levothyroxine, continue to monitor      Hyperlipidemia    Encouraged heart healthy diet, increase exercise, avoid trans fats, consider a krill oil cap daily      Relevant Medications   atorvastatin (LIPITOR) 10 MG tablet   Thrombocythemia ([place])    Continues to follow closely with hematology      Hyperglycemia    hgba1c acceptable, minimize simple carbs. Increase exercise as tolerated.       Vitamin D deficiency    Supplement and monitor      Essential hypertension    Encouraged to check Vital Signs weekly and let us know if any concerns develop, no changes to meds. Encouraged heart healthy diet such as the DASH diet and exercise as tolerated.       Relevant Medications   atorvastatin (LIPITOR) 10 MG tablet   Chronic back pain     Struggles with daily pain but manages her symptoms by staying active as tolerated and pain medications prn.      Relevant Medications   oxyCODONE-acetaminophen (PERCOCET) 10-325 MG tablet   cyclobenzaprine (FLEXERIL) 10 MG tablet   gabapentin (NEURONTIN) 400 MG capsule      I have changed Analissa D. Callas's cyclobenzaprine. I am also having her maintain her Multiple Vitamin (MULTIVITAMIN PO), Cranberry, budesonide-formoterol, magic mouthwash, nystatin, fluticasone, montelukast, fluconazole, ondansetron, venlafaxine XR, TUBERCULIN SYR 1CC/27GX1/2", peginterferon alfa-2a, albuterol, granisetron, chlorpheniramine, Restasis, traZODone, Euthyrox,  meclizine, SSD, furosemide, oxyCODONE-acetaminophen, ALPRAZolam, busPIRone, gabapentin, cetirizine, estradiol, famotidine, and atorvastatin.  Meds ordered this encounter  Medications  . oxyCODONE-acetaminophen (PERCOCET) 10-325 MG tablet    Sig: Take 1 tablet by mouth every 6 (six) hours as needed for pain.    Dispense:  120 tablet    Refill:  0  . ALPRAZolam (XANAX) 1 MG tablet    Sig: TAKE 1/2 TO 1 (ONE-HALF TO ONE) TABLET BY MOUTH THREE TIMES DAILY AS NEEDED FOR ANXIETY    Dispense:  90 tablet    Refill:  1  . cyclobenzaprine (FLEXERIL) 10 MG tablet    Sig: Take 1 tablet (10 mg total) by mouth daily as needed for muscle spasms.    Dispense:  60 tablet    Refill:  2  . busPIRone (BUSPAR) 15 MG tablet    Sig: Take 1 tablet (15 mg total) by mouth 4 (four) times daily as needed.    Dispense:  120 tablet    Refill:  2  . gabapentin (NEURONTIN) 400 MG capsule    Sig: Take 1 capsule (400 mg total) by mouth daily.    Dispense:  90 capsule    Refill:  1  . cetirizine (ZYRTEC) 10 MG tablet    Sig: Take 1 tablet (10 mg total) by mouth daily.    Dispense:  90 tablet    Refill:  1  . estradiol (ESTRACE) 0.5 MG tablet    Sig: Take 1 tablet (0.5 mg total) by mouth 2 (two) times daily.    Dispense:  180 tablet    Refill:  1  . famotidine (PEPCID) 40  MG tablet    Sig: Take 1 tablet (40 mg total) by mouth daily.    Dispense:  90 tablet    Refill:  1  . atorvastatin (LIPITOR) 10 MG tablet    Sig: Take 1 tablet (10 mg total) by mouth daily.    Dispense:  90 tablet    Refill:  3   I discussed the assessment and treatment plan with the patient. The patient was provided an opportunity to ask questions and all were answered. The patient agreed with the plan and demonstrated an understanding of the instructions.   The patient was advised to call back or seek an in-person evaluation if the symptoms worsen or if the condition fails to improve as anticipated.  I provided 25 minutes of non-face-to-face time during this encounter.   Penni Homans, MD

## 2019-01-28 NOTE — Assessment & Plan Note (Signed)
Struggles with daily pain but manages her symptoms by staying active as tolerated and pain medications prn.

## 2019-01-28 NOTE — Assessment & Plan Note (Signed)
Continues to follow closely with hematology

## 2019-01-28 NOTE — Assessment & Plan Note (Signed)
Supplement and monitor 

## 2019-01-28 NOTE — Assessment & Plan Note (Signed)
On Levothyroxine, continue to monitor 

## 2019-01-28 NOTE — Assessment & Plan Note (Signed)
Encouraged heart healthy diet, increase exercise, avoid trans fats, consider a krill oil cap daily 

## 2019-02-01 ENCOUNTER — Other Ambulatory Visit: Payer: Self-pay | Admitting: Family Medicine

## 2019-02-01 DIAGNOSIS — E559 Vitamin D deficiency, unspecified: Secondary | ICD-10-CM

## 2019-02-01 DIAGNOSIS — R739 Hyperglycemia, unspecified: Secondary | ICD-10-CM

## 2019-02-01 DIAGNOSIS — R300 Dysuria: Secondary | ICD-10-CM

## 2019-02-01 DIAGNOSIS — I10 Essential (primary) hypertension: Secondary | ICD-10-CM

## 2019-02-05 ENCOUNTER — Other Ambulatory Visit: Payer: Self-pay

## 2019-02-05 ENCOUNTER — Ambulatory Visit (INDEPENDENT_AMBULATORY_CARE_PROVIDER_SITE_OTHER): Payer: Medicare Other | Admitting: Family Medicine

## 2019-02-05 DIAGNOSIS — F418 Other specified anxiety disorders: Secondary | ICD-10-CM | POA: Diagnosis not present

## 2019-02-05 DIAGNOSIS — J029 Acute pharyngitis, unspecified: Secondary | ICD-10-CM | POA: Diagnosis not present

## 2019-02-05 DIAGNOSIS — G4700 Insomnia, unspecified: Secondary | ICD-10-CM

## 2019-02-05 DIAGNOSIS — I10 Essential (primary) hypertension: Secondary | ICD-10-CM

## 2019-02-05 MED ORDER — VENLAFAXINE HCL ER 150 MG PO CP24: 150.0000 mg | ORAL_CAPSULE | Freq: Every day | ORAL | 1 refills | Status: DC

## 2019-02-05 MED ORDER — AZITHROMYCIN 250 MG PO TABS: ORAL_TABLET | ORAL | 0 refills | Status: DC

## 2019-02-05 MED ORDER — TRAZODONE HCL 100 MG PO TABS: 100.0000 mg | ORAL_TABLET | Freq: Every day | ORAL | 1 refills | Status: DC

## 2019-02-05 NOTE — Progress Notes (Signed)
Virtual Visit via home Note  I connected with Rachael Johnson on 02/05/19 at  9:00 AM EDT by a home enabled telemedicine application and verified that I am speaking with the correct person using two identifiers.  Location: Patient: home Provider: home   I discussed the limitations of evaluation and management by telemedicine and the availability of in person appointments. The patient expressed understanding and agreed to proceed. [name] was able to get patient set up on phone visit since she was unable to complete a video visit    Subjective:    Patient ID: Rachael Johnson, female    DOB: [birth year], 68 y.o.   MRN: [id number]  No chief complaint on file.   HPI Patient is in today for evaluation of worsening depression.  Her family member contacted [place] and was worried about her worsening depression.  She has just passed the anniversary of her husband's death and she does acknowledge that every year this is a difficult time for her.  She does feel she will come through it and she denies suicidal ideation.  She does endorse some anhedonia and fatigue.  She also notes last night she developed a sore throat with a subjective fever and malaise.  Feels that is contributing as well as the pandemic lockdown and her chronic medical states.  She notes she is having trouble sleeping and will go 2-3 nights before she is able to sleep she is only taking the trazodone intermittently.  But it does not seem to help her sleeping. Denies CP/palp/SOB/HA/congestion/GI or GU c/o. Taking meds as prescribed  Past Medical History:  Diagnosis Date  . Acute renal insufficiency 04/09/2017  . Anemia, iron deficiency 07/08/2012  . Arthritis   . Cataract 12/21/2014  . Depression with anxiety 03/24/2011  . Diverticulitis   . Esophageal reflux 08/17/2013  . Essential thrombocythemia ([place]) 01/24/2011  . Frequent episodic tension-type headache   . Gastroenteritis 12/21/2014  . 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 GERD  (gastroesophageal reflux disease)   . H. pylori infection 12/19/2012  . Hyperglycemia 12/17/2015  . Hypertension   . Iron deficiency anemia due to chronic blood loss 04/03/2017  . Osteoarthritis 05/24/2017  . Other and unspecified hyperlipidemia 02/25/2013  . Restless leg syndrome 09/30/2014  . Rhabdomyolysis 02/2017  . Scabies 03/28/2015  . 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 Seizure ([place])    childhood  . Shoulder wound, right, sequela 03/14/2017  . Thyroid disease     Past Surgical History:  Procedure Laterality Date  . ABDOMINAL HYSTERECTOMY     1992  . BREAST CYST ASPIRATION    . BREAST SURGERY  1992   biopsy, benign. Fibrocystic  . UMBILICAL HERNIA REPAIR N/A 09/25/2012   Procedure: HERNIA REPAIR UMBILICAL ADULT;  Surgeon: Harl Bowie, MD;  Location: [facility];  Service: General;  Laterality: N/A;    Family History  Problem Relation Age of Onset  . Arthritis Mother   . Cancer Mother        ovarian  . Hypertension Mother   . Heart disease Mother        pacer  . Heart failure Mother   . Arthritis Father   . Cancer Father        lung  . Hypertension Father   . Cancer Sister        lung  . Cancer Brother        prostate  . Cancer Brother        lung  . Hypertension  Son   . COPD Brother   . Heart disease Brother        died from CHF  . Hypertension Brother   . Cancer Brother        colon  . Alcohol abuse Brother   . Cirrhosis Brother   . Seizures Sister   . Stroke Sister   . Arthritis Brother     Social History   Socioeconomic History  . Marital status: Widowed    Spouse name: Not on file  . Number of children: 2  . Years of education: Not on file  . Highest education level: Not on file  Occupational History  . Occupation: APL IT sales professional: [employer]    Comment: [place]  . Financial resource strain: Not on file  . Food insecurity    Worry: Not on file    Inability: Not on file  . Transportation needs    Medical:  Not on file    Non-medical: Not on file  Tobacco Use  . Smoking status: Former Smoker    Packs/day: 3.50    Years: 20.00    Pack years: 70.00    Types: Cigarettes    Start date: 11/15/1970    Quit date: 07/31/1990    Years since quitting: 28.5  . Smokeless tobacco: Never Used  . Tobacco comment: quit 23 years ago  Substance and Sexual Activity  . Alcohol use: No    Alcohol/week: 0.0 standard drinks  . Drug use: No  . Sexual activity: Never  Lifestyle  . Physical activity    Days per week: Not on file    Minutes per session: Not on file  . Stress: Not on file  Relationships  . Social Herbalist on phone: Not on file    Gets together: Not on file    Attends religious service: Not on file    Active member of club or organization: Not on file    Attends meetings of clubs or organizations: Not on file    Relationship status: Not on file  . Intimate partner violence    Fear of current or ex partner: Not on file    Emotionally abused: Not on file    Physically abused: Not on file    Forced sexual activity: Not on file  Other Topics Concern  . Not on file  Social History Narrative   Regular exercise: no   Caffeine Use: 2-3 weekly    Outpatient Medications Prior to Visit  Medication Sig Dispense Refill  . albuterol (PROVENTIL HFA;VENTOLIN HFA) 108 (90 Base) MCG/ACT inhaler INHALE 2 PUFFS BY MOUTH EVERY 6 HOURS AS NEEDED FOR WHEEZING OR SHORTNESS OF BREATH 54 g 0  . ALPRAZolam (XANAX) 1 MG tablet TAKE 1/2 TO 1 (ONE-HALF TO ONE) TABLET BY MOUTH THREE TIMES DAILY AS NEEDED FOR ANXIETY 90 tablet 1  . atorvastatin (LIPITOR) 10 MG tablet Take 1 tablet (10 mg total) by mouth daily. 90 tablet 3  . budesonide-formoterol (SYMBICORT) 80-4.5 MCG/ACT inhaler Inhale 2 puffs into the lungs 2 (two) times daily. 10.2 g 5  . busPIRone (BUSPAR) 15 MG tablet Take 1 tablet (15 mg total) by mouth 4 (four) times daily as needed. 120 tablet 2  . cetirizine (ZYRTEC) 10 MG tablet Take 1 tablet  (10 mg total) by mouth daily. 90 tablet 1  . chlorpheniramine (CHLOR-TRIMETON) 4 MG tablet Take 2 tablets (8 mg total) by mouth at bedtime as needed for allergies.  30 tablet 1  . Cranberry 300 MG tablet Take 300 mg by mouth 2 (two) times daily.    . cyclobenzaprine (FLEXERIL) 10 MG tablet Take 1 tablet (10 mg total) by mouth daily as needed for muscle spasms. 60 tablet 2  . estradiol (ESTRACE) 0.5 MG tablet Take 1 tablet (0.5 mg total) by mouth 2 (two) times daily. 180 tablet 1  . EUTHYROX 100 MCG tablet TAKE 1 TABLET BY MOUTH ONCE DAILY BEFORE BREAKFAST 90 tablet 0  . famotidine (PEPCID) 40 MG tablet Take 1 tablet (40 mg total) by mouth daily. 90 tablet 1  . fluconazole (DIFLUCAN) 150 MG tablet Take 1 tablet (150 mg total) by mouth once a week. 2 tablet 1  . fluticasone (FLONASE) 50 MCG/ACT nasal spray Place 2 sprays into both nostrils daily. 48 g 3  . furosemide (LASIX) 20 MG tablet TAKE 1 TABLET BY MOUTH THREE TIMES DAILY AS NEEDED FOR EDEMA OR  FLUID 90 tablet 0  . gabapentin (NEURONTIN) 400 MG capsule Take 1 capsule (400 mg total) by mouth daily. 90 capsule 1  . granisetron (SANCUSO) 3.1 MG/24HR Apply to skin starting 24 hours before chemotherapy. Remove after 7 days. 4 each 4  . magic mouthwash SOLN Take 5 mLs by mouth 3 (three) times daily as needed for mouth pain. Components benadryl  525 mg, hydrocortisone 60 mg and nystatin 0.6 mg. 240 ml - Oral 240 mL 3  . meclizine (ANTIVERT) 12.5 MG tablet TAKE 1 TABLET BY MOUTH THREE TIMES DAILY AS NEEDED FOR DIZZINESS 40 tablet 0  . meclizine (ANTIVERT) 12.5 MG tablet TAKE 1 TABLET BY MOUTH THREE TIMES DAILY AS NEEDED FOR DIZZINESS 40 tablet 0  . montelukast (SINGULAIR) 10 MG tablet Take 1 tablet (10 mg total) by mouth at bedtime. 90 tablet 2  . Multiple Vitamin (MULTIVITAMIN PO) Take 1 tablet by mouth every morning.     . nystatin (MYCOSTATIN) 100000 UNIT/ML suspension     . ondansetron (ZOFRAN) 8 MG tablet TAKE 1/2 TO 1 TABLET BY MOUTH EVERY 8  HOURS AS NEEDED FOR NAUSEA AND VOMITING 90 tablet 1  . oxyCODONE-acetaminophen (PERCOCET) 10-325 MG tablet Take 1 tablet by mouth every 6 (six) hours as needed for pain. 120 tablet 0  . peginterferon alfa-2a (PEGASYS) 180 MCG/ML injection Inject 45 mcg into the skin every 7 (seven) days.    . RESTASIS 0.05 % ophthalmic emulsion PLACE ONE DROP INTO EACH EYE EVERY 12 HOURS 0.4 mL 3  . SSD 1 % cream APPLY TOPICALLY DAILY 50 g 0  . TUBERCULIN SYR 1CC/27GX1/2" 27G X 1/2" 1 ML MISC Use as directed once weekly for peg-interferon injections 4 each 11  . traZODone (DESYREL) 50 MG tablet Take 1 tablet (50 mg total) by mouth at bedtime. 30 tablet 3  . venlafaxine XR (EFFEXOR-XR) 150 MG 24 hr capsule Take 1 capsule (150 mg total) by mouth daily with breakfast. 90 capsule 1   No facility-administered medications prior to visit.     No Known Allergies  Review of Systems  Constitutional: Positive for fever and malaise/fatigue.  HENT: Positive for sore throat. Negative for congestion.   Eyes: Negative for blurred vision.  Respiratory: Negative for shortness of breath.   Cardiovascular: Negative for chest pain, palpitations and leg swelling.  Gastrointestinal: Negative for abdominal pain, blood in stool and nausea.  Genitourinary: Negative for dysuria and frequency.  Musculoskeletal: Negative for falls.  Skin: Negative for rash.  Neurological: Negative for dizziness, loss of consciousness and headaches.  Endo/Heme/Allergies: Negative for environmental allergies.  Psychiatric/Behavioral: Positive for depression. Negative for suicidal ideas. The patient is nervous/anxious and has insomnia.        Objective:    Physical Exam unable to obtain via phone  LMP 07/31/1990  Wt Readings from Last 3 Encounters:  01/28/19 158 lb (71.7 kg)  01/20/19 159 lb (72.1 kg)  12/30/18 157 lb (71.2 kg)    Diabetic Foot Exam - Simple   No data filed     Lab Results  Component Value Date   WBC 3.6 (L)  01/20/2019   HGB 11.5 (L) 01/20/2019   HCT 35.7 (L) 01/20/2019   PLT 597 (H) 01/20/2019   GLUCOSE 99 01/20/2019   CHOL 236 (H) 05/06/2018   TRIG 322.0 (H) 05/06/2018   HDL 59.00 05/06/2018   LDLDIRECT 95.0 05/06/2018   LDLCALC 82 02/12/2017   ALT 28 01/20/2019   AST 40 01/20/2019   NA 138 01/20/2019   K 3.5 01/20/2019   CL 101 01/20/2019   CREATININE 0.82 01/20/2019   BUN 14 01/20/2019   CO2 30 01/20/2019   TSH 0.289 (L) 01/20/2019   INR 0.94 09/16/2018   HGBA1C 5.0 08/10/2017    Lab Results  Component Value Date   TSH 0.289 (L) 01/20/2019   Lab Results  Component Value Date   WBC 3.6 (L) 01/20/2019   HGB 11.5 (L) 01/20/2019   HCT 35.7 (L) 01/20/2019   MCV 106.9 (H) 01/20/2019   PLT 597 (H) 01/20/2019   Lab Results  Component Value Date   NA 138 01/20/2019   K 3.5 01/20/2019   CHLORIDE 105 11/29/2016   CO2 30 01/20/2019   GLUCOSE 99 01/20/2019   BUN 14 01/20/2019   CREATININE 0.82 01/20/2019   BILITOT 0.4 01/20/2019   ALKPHOS 96 01/20/2019   AST 40 01/20/2019   ALT 28 01/20/2019   PROT 7.4 01/20/2019   ALBUMIN 4.2 01/20/2019   CALCIUM 9.3 01/20/2019   ANIONGAP 7 01/20/2019   EGFR 80 (L) 11/29/2016   GFR 62.50 05/06/2018   Lab Results  Component Value Date   CHOL 236 (H) 05/06/2018   Lab Results  Component Value Date   HDL 59.00 05/06/2018   Lab Results  Component Value Date   LDLCALC 82 02/12/2017   Lab Results  Component Value Date   TRIG 322.0 (H) 05/06/2018   Lab Results  Component Value Date   CHOLHDL 4 05/06/2018   Lab Results  Component Value Date   HGBA1C 5.0 08/10/2017       Assessment & Plan:   Problem List Items Addressed This Visit    Depression with anxiety    Has just passed the anniversary of her husband's death and that has really caused her depression to worsen but there is no suicidal ideation and she does not feel she wants to chang her meds too much today. Because she is not sleeping well that is contributing so  she can increase her Trazodone to 100 mg qhs and if she sleeps that help. She is only using Alprazolam at 1/2 tab dose a couple of times a week       Relevant Medications   traZODone (DESYREL) 100 MG tablet   venlafaxine XR (EFFEXOR-XR) 150 MG 24 hr capsule   Pharyngitis    Symptoms began last night with malaise and subjective fever. She is quarantining well but does have some minimal contact with family weekly. No one else is sick. Will treat with azithromycin and reevaluate symptoms  next week or sooner if patient contacts [place] with worsening symptoms      Essential hypertension    no changes to meds. Encouraged heart healthy diet such as the DASH diet and exercise as tolerated.       Insomnia    Encouraged good sleep hygiene such as dark, quiet room. No blue/green glowing lights such as computer screens in bedroom. No alcohol or stimulants in evening. Cut down on caffeine as able. Regular exercise is helpful but not just prior to bed time. Increase Trazodone to 100 mg qhs         I have discontinued Naveen D. Loeber's traZODone. I am also having her start on azithromycin and traZODone. Additionally, I am having her maintain her Multiple Vitamin (MULTIVITAMIN PO), Cranberry, budesonide-formoterol, magic mouthwash, nystatin, fluticasone, montelukast, fluconazole, ondansetron, TUBERCULIN SYR 1CC/27GX1/2", peginterferon alfa-2a, albuterol, granisetron, chlorpheniramine, Restasis, Euthyrox, SSD, oxyCODONE-acetaminophen, ALPRAZolam, cyclobenzaprine, busPIRone, gabapentin, cetirizine, estradiol, famotidine, atorvastatin, meclizine, furosemide, meclizine, and venlafaxine XR.  Meds ordered this encounter  Medications  . azithromycin (ZITHROMAX) 250 MG tablet    Sig: 2 tabs po once and then 1 tab po daily x 4 days    Dispense:  6 tablet    Refill:  0  . traZODone (DESYREL) 100 MG tablet    Sig: Take 1 tablet (100 mg total) by mouth at bedtime.    Dispense:  30 tablet    Refill:  1  .  venlafaxine XR (EFFEXOR-XR) 150 MG 24 hr capsule    Sig: Take 1 capsule (150 mg total) by mouth daily with breakfast.    Dispense:  90 capsule    Refill:  1    I discussed the assessment and treatment plan with the patient. The patient was provided an opportunity to ask questions and all were answered. The patient agreed with the plan and demonstrated an understanding of the instructions.   The patient was advised to call back or seek an in-person evaluation if the symptoms worsen or if the condition fails to improve as anticipated.  I provided 30 minutes of non-face-to-face time during this encounter.   Penni Homans, MD

## 2019-02-05 NOTE — Assessment & Plan Note (Signed)
Has just passed the anniversary of her husband's death and that has really caused her depression to worsen but there is no suicidal ideation and she does not feel she wants to chang her meds too much today. Because she is not sleeping well that is contributing so she can increase her Trazodone to 100 mg qhs and if she sleeps that help. She is only using Alprazolam at 1/2 tab dose a couple of times a week

## 2019-02-05 NOTE — Assessment & Plan Note (Signed)
Symptoms began last night with malaise and subjective fever. She is quarantining well but does have some minimal contact with family weekly. No one else is sick. Will treat with azithromycin and reevaluate symptoms next week or sooner if patient contacts [place] with worsening symptoms

## 2019-02-05 NOTE — Assessment & Plan Note (Signed)
no changes to meds. Encouraged heart healthy diet such as the DASH diet and exercise as tolerated.  

## 2019-02-05 NOTE — Assessment & Plan Note (Signed)
Encouraged good sleep hygiene such as dark, quiet room. No blue/green glowing lights such as computer screens in bedroom. No alcohol or stimulants in evening. Cut down on caffeine as able. Regular exercise is helpful but not just prior to bed time. Increase Trazodone to 100 mg qhs

## 2019-02-17 ENCOUNTER — Telehealth: Payer: Self-pay | Admitting: *Deleted

## 2019-02-17 ENCOUNTER — Inpatient Hospital Stay: Payer: Medicare Other

## 2019-02-17 ENCOUNTER — Inpatient Hospital Stay: Payer: Medicare Other | Attending: Hematology & Oncology | Admitting: Hematology & Oncology

## 2019-02-17 ENCOUNTER — Telehealth: Payer: Self-pay

## 2019-02-17 ENCOUNTER — Ambulatory Visit (HOSPITAL_BASED_OUTPATIENT_CLINIC_OR_DEPARTMENT_OTHER)
Admission: RE | Admit: 2019-02-17 | Discharge: 2019-02-17 | Disposition: A | Discharge status: Home / Self Care | Payer: Medicare Other | Source: Ambulatory Visit | Attending: Hematology & Oncology | Admitting: Hematology & Oncology

## 2019-02-17 ENCOUNTER — Encounter: Payer: Self-pay | Admitting: Hematology & Oncology

## 2019-02-17 ENCOUNTER — Other Ambulatory Visit: Payer: Self-pay

## 2019-02-17 ENCOUNTER — Ambulatory Visit (INDEPENDENT_AMBULATORY_CARE_PROVIDER_SITE_OTHER): Payer: Medicare Other | Admitting: Family Medicine

## 2019-02-17 VITALS — BP 137/75 | HR 89 | Temp 97.7°F | Resp 18 | Wt 166.0 lb

## 2019-02-17 DIAGNOSIS — I10 Essential (primary) hypertension: Secondary | ICD-10-CM

## 2019-02-17 DIAGNOSIS — G4700 Insomnia, unspecified: Secondary | ICD-10-CM | POA: Diagnosis not present

## 2019-02-17 DIAGNOSIS — Z7982 Long term (current) use of aspirin: Secondary | ICD-10-CM | POA: Diagnosis not present

## 2019-02-17 DIAGNOSIS — E032 Hypothyroidism due to medicaments and other exogenous substances: Secondary | ICD-10-CM | POA: Diagnosis not present

## 2019-02-17 DIAGNOSIS — D7581 Myelofibrosis: Secondary | ICD-10-CM | POA: Diagnosis not present

## 2019-02-17 DIAGNOSIS — F419 Anxiety disorder, unspecified: Secondary | ICD-10-CM | POA: Diagnosis not present

## 2019-02-17 DIAGNOSIS — D473 Essential (hemorrhagic) thrombocythemia: Secondary | ICD-10-CM

## 2019-02-17 DIAGNOSIS — M7989 Other specified soft tissue disorders: Secondary | ICD-10-CM

## 2019-02-17 DIAGNOSIS — K219 Gastro-esophageal reflux disease without esophagitis: Secondary | ICD-10-CM | POA: Diagnosis not present

## 2019-02-17 DIAGNOSIS — Z79899 Other long term (current) drug therapy: Secondary | ICD-10-CM | POA: Insufficient documentation

## 2019-02-17 DIAGNOSIS — F418 Other specified anxiety disorders: Secondary | ICD-10-CM | POA: Diagnosis not present

## 2019-02-17 DIAGNOSIS — E785 Hyperlipidemia, unspecified: Secondary | ICD-10-CM | POA: Diagnosis not present

## 2019-02-17 DIAGNOSIS — D75839 Thrombocytosis, unspecified: Secondary | ICD-10-CM

## 2019-02-17 DIAGNOSIS — D500 Iron deficiency anemia secondary to blood loss (chronic): Secondary | ICD-10-CM

## 2019-02-17 DIAGNOSIS — D509 Iron deficiency anemia, unspecified: Secondary | ICD-10-CM | POA: Diagnosis not present

## 2019-02-17 DIAGNOSIS — R739 Hyperglycemia, unspecified: Secondary | ICD-10-CM | POA: Diagnosis not present

## 2019-02-17 LAB — CBC WITH DIFFERENTIAL (CANCER CENTER ONLY)
Abs Immature Granulocytes: 0.01 10*3/uL (ref 0.00–0.07)
Basophils Absolute: 0 10*3/uL (ref 0.0–0.1)
Basophils Relative: 1 %
Eosinophils Absolute: 0.3 10*3/uL (ref 0.0–0.5)
Eosinophils Relative: 9 %
HCT: 30.5 % — ABNORMAL LOW (ref 36.0–46.0)
Hemoglobin: 9.8 g/dL — ABNORMAL LOW (ref 12.0–15.0)
Immature Granulocytes: 0 %
Lymphocytes Relative: 26 %
Lymphs Abs: 0.9 10*3/uL (ref 0.7–4.0)
MCH: 34.1 pg — ABNORMAL HIGH (ref 26.0–34.0)
MCHC: 32.1 g/dL (ref 30.0–36.0)
MCV: 106.3 fL — ABNORMAL HIGH (ref 80.0–100.0)
Monocytes Absolute: 0.3 10*3/uL (ref 0.1–1.0)
Monocytes Relative: 8 %
Neutro Abs: 2.1 10*3/uL (ref 1.7–7.7)
Neutrophils Relative %: 56 %
Platelet Count: 486 10*3/uL — ABNORMAL HIGH (ref 150–400)
RBC: 2.87 MIL/uL — ABNORMAL LOW (ref 3.87–5.11)
RDW: 15.6 % — ABNORMAL HIGH (ref 11.5–15.5)
WBC Count: 3.6 10*3/uL — ABNORMAL LOW (ref 4.0–10.5)
nRBC: 0 % (ref 0.0–0.2)

## 2019-02-17 LAB — CMP (CANCER CENTER ONLY)
ALT: 19 U/L (ref 0–44)
AST: 30 U/L (ref 15–41)
Albumin: 3.5 g/dL (ref 3.5–5.0)
Alkaline Phosphatase: 106 U/L (ref 38–126)
Anion gap: 8 (ref 5–15)
BUN: 6 mg/dL — ABNORMAL LOW (ref 8–23)
CO2: 28 mmol/L (ref 22–32)
Calcium: 7.7 mg/dL — ABNORMAL LOW (ref 8.9–10.3)
Chloride: 105 mmol/L (ref 98–111)
Creatinine: 0.69 mg/dL (ref 0.44–1.00)
GFR, Est AFR Am: >60 mL/min (ref >60)
GFR, Estimated: >60 mL/min (ref >60)
Glucose, Bld: 122 mg/dL — ABNORMAL HIGH (ref 70–99)
Potassium: 2.8 mmol/L — CL (ref 3.5–5.1)
Sodium: 141 mmol/L (ref 135–145)
Total Bilirubin: 0.4 mg/dL (ref 0.3–1.2)
Total Protein: 6.3 g/dL — ABNORMAL LOW (ref 6.5–8.1)

## 2019-02-17 LAB — SAVE SMEAR(SSMR), FOR PROVIDER SLIDE REVIEW

## 2019-02-17 LAB — LACTATE DEHYDROGENASE: LDH: 257 U/L — ABNORMAL HIGH (ref 98–192)

## 2019-02-17 LAB — TSH: TSH: 0.134 u[IU]/mL — ABNORMAL LOW (ref 0.308–3.960)

## 2019-02-17 MED ORDER — VENLAFAXINE HCL ER 75 MG PO CP24: 75.0000 mg | ORAL_CAPSULE | Freq: Three times a day (TID) | ORAL | 2 refills | Status: DC

## 2019-02-17 MED ORDER — DOXEPIN HCL 6 MG PO TABS: 6.0000 mg | ORAL_TABLET | Freq: Every day | ORAL | 1 refills | Status: DC

## 2019-02-17 NOTE — Progress Notes (Signed)
Hematology and Oncology Follow Up Visit  Rachael Johnson [id number] May 24, 1951 68 y.o. 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  Her problem now is that she has a swollen left leg.  Is been that way for about 3 days.  She denies any kind of falling.  There is no trauma.  There is no erythema or warmth of the left leg.  We will get a Doppler of her leg today.  The interferon is working.  Her potassium is quite low at 2.8.  We will have to give her some supplemental potassium.  I will try to move her interferon to every 2-week dosing.  Maybe this will be a little more tolerable for her and still effective.  She has had no fever.  She has had no cough.  There is been no nausea or vomiting.  There has been no change in bowel or bladder habits.  She has had no diarrhea.  Overall, her performance status is ECOG 1.    Medications:  Allergies as of 02/17/2019   No Known Allergies     Medication List       Accurate as of February 17, 2019 12:17 PM. If you have any questions, ask your nurse or doctor.        albuterol 108 (90 Base) MCG/ACT inhaler Commonly known as: VENTOLIN HFA INHALE 2 PUFFS BY MOUTH EVERY 6 HOURS AS NEEDED FOR WHEEZING OR SHORTNESS OF BREATH   ALPRAZolam 1 MG tablet Commonly known as: XANAX TAKE 1/2 TO 1 (ONE-HALF TO ONE) TABLET BY MOUTH THREE TIMES DAILY AS NEEDED FOR ANXIETY   atorvastatin 10 MG tablet Commonly known as: LIPITOR Take 1 tablet (10 mg total) by mouth daily.   azithromycin 250 MG tablet Commonly known as: ZITHROMAX 2 tabs po once  and then 1 tab po daily x 4 days   budesonide-formoterol 80-4.5 MCG/ACT inhaler Commonly known as: Symbicort Inhale 2 puffs into the lungs 2 (two) times daily.   busPIRone 15 MG tablet Commonly known as: BUSPAR Take 1 tablet (15 mg total) by mouth 4 (four) times daily as needed.   cetirizine 10 MG tablet Commonly known as: ZYRTEC Take 1 tablet (10 mg total) by mouth daily.   chlorpheniramine 4 MG tablet Commonly known as: CHLOR-TRIMETON Take 2 tablets (8 mg total) by mouth at bedtime as needed for allergies.   Cranberry 300 MG tablet Take 300 mg by mouth 2 (two) times daily.   cyclobenzaprine 10 MG tablet Commonly known as: FLEXERIL Take 1 tablet (10 mg total) by mouth daily as needed for muscle spasms.   estradiol 0.5 MG tablet Commonly known as: ESTRACE Take 1 tablet (0.5 mg total) by mouth 2 (two) times daily.   Euthyrox 100 MCG tablet Generic drug: levothyroxine TAKE 1 TABLET BY MOUTH ONCE DAILY BEFORE BREAKFAST   famotidine 40 MG tablet Commonly known as: PEPCID Take 1 tablet (40 mg total) by mouth daily.   fluconazole 150 MG tablet Commonly known as: DIFLUCAN Take 1 tablet (150 mg total) by mouth once a week.   fluticasone 50 MCG/ACT  nasal spray Commonly known as: FLONASE Place 2 sprays into both nostrils daily.   furosemide 20 MG tablet Commonly known as: LASIX TAKE 1 TABLET BY MOUTH THREE TIMES DAILY AS NEEDED FOR EDEMA OR  FLUID   gabapentin 400 MG capsule Commonly known as: NEURONTIN Take 1 capsule (400 mg total) by mouth daily.   granisetron 3.1 MG/24HR Commonly known as: SANCUSO Apply to skin starting 24 hours before chemotherapy. Remove after 7 days.   magic mouthwash Soln Take 5 mLs by mouth 3 (three) times daily as needed for mouth pain. Components benadryl  525 mg, hydrocortisone 60 mg and nystatin 0.6 mg. 240 ml - Oral   meclizine 12.5 MG tablet Commonly known as: ANTIVERT TAKE 1 TABLET BY MOUTH THREE TIMES DAILY AS NEEDED FOR DIZZINESS    meclizine 12.5 MG tablet Commonly known as: ANTIVERT TAKE 1 TABLET BY MOUTH THREE TIMES DAILY AS NEEDED FOR DIZZINESS   montelukast 10 MG tablet Commonly known as: SINGULAIR Take 1 tablet (10 mg total) by mouth at bedtime.   MULTIVITAMIN PO Take 1 tablet by mouth every morning.   nystatin 100000 UNIT/ML suspension Commonly known as: MYCOSTATIN   ondansetron 8 MG tablet Commonly known as: ZOFRAN TAKE 1/2 TO 1 TABLET BY MOUTH EVERY 8 HOURS AS NEEDED FOR NAUSEA AND VOMITING   oxyCODONE-acetaminophen 10-325 MG tablet Commonly known as: PERCOCET Take 1 tablet by mouth every 6 (six) hours as needed for pain.   peginterferon alfa-2a 180 MCG/ML injection Commonly known as: PEGASYS Inject 45 mcg into the skin every 7 (seven) days.   penicillin v potassium 500 MG tablet Commonly known as: VEETID Take 500 mg by mouth every 8 (eight) hours.   Restasis 0.05 % ophthalmic emulsion Generic drug: cycloSPORINE PLACE ONE DROP INTO EACH EYE EVERY 12 HOURS   SSD 1 % cream Generic drug: silver sulfADIAZINE APPLY TOPICALLY DAILY   traZODone 100 MG tablet Commonly known as: DESYREL Take 1 tablet (100 mg total) by mouth at bedtime.   TUBERCULIN SYR 1CC/27GX1/2" 27G X 1/2" 1 ML Misc Use as directed once weekly for peg-interferon injections   venlafaxine XR 150 MG 24 hr capsule Commonly known as: EFFEXOR-XR Take 1 capsule (150 mg total) by mouth daily with breakfast.       Allergies: No Known Allergies  Past Medical History, Surgical history, Social history, and Family History were reviewed and updated.  Review of Systems: Review of Systems  Constitutional: Positive for diaphoresis and malaise/fatigue.  HENT: Negative.   Eyes: Negative.   Respiratory: Positive for shortness of breath.   Cardiovascular: Negative.   Gastrointestinal: Positive for abdominal pain, heartburn and nausea.  Genitourinary: Negative.   Musculoskeletal: Positive for joint pain and myalgias.  Skin:  Negative.   Neurological: Positive for dizziness.  Endo/Heme/Allergies: Negative.   Psychiatric/Behavioral: Negative.      Physical Exam:  weight is 166 lb (75.3 kg). Her oral temperature is 97.7 F (36.5 C). Her blood pressure is 137/75 and her pulse is 89. Her respiration is 18 and oxygen saturation is 97%.   Wt Readings from Last 3 Encounters:  02/17/19 166 lb (75.3 kg)  01/28/19 158 lb (71.7 kg)  01/20/19 159 lb (72.1 kg)    Physical Exam Vitals signs reviewed.  HENT:     Head: Normocephalic and atraumatic.  Eyes:     Pupils: Pupils are equal, round, and reactive to light.  Neck:     Musculoskeletal: Normal range of motion.  Cardiovascular:     Rate  and Rhythm: Normal rate and regular rhythm.     Heart sounds: Normal heart sounds.  Pulmonary:     Effort: Pulmonary effort is normal.     Breath sounds: Normal breath sounds.  Abdominal:     General: Bowel sounds are normal.     Palpations: Abdomen is soft.     Comments: Her spleen tip is palpable a couple centimeters below the left costal margin.  Musculoskeletal: Normal range of motion.        General: No tenderness or deformity.     Comments: Left leg is somewhat swollen.  There is nonpitting edema of the left leg.  No venous cord is noted in the left leg.  She may have a positive Homans sign.  Pulses are intact.  Lymphadenopathy:     Cervical: No cervical adenopathy.  Skin:    General: Skin is warm and dry.     Findings: No erythema or rash.  Neurological:     Mental Status: She is alert and oriented to person, place, and time.  Psychiatric:        Behavior: Behavior normal.        Thought Content: Thought content normal.        Judgment: Judgment normal.      Lab Results  Component Value Date   WBC 3.6 (L) 02/17/2019   HGB 9.8 (L) 02/17/2019   HCT 30.5 (L) 02/17/2019   MCV 106.3 (H) 02/17/2019   PLT 486 (H) 02/17/2019   Lab Results  Component Value Date   FERRITIN 2,054 (H) 10/21/2018   IRON 90  10/21/2018   TIBC 195 (L) 10/21/2018   UIBC 105 (L) 10/21/2018   IRONPCTSAT 46 10/21/2018   Lab Results  Component Value Date   RETICCTPCT 1.9 10/21/2018   RBC 2.87 (L) 02/17/2019   RETICCTABS 45.8 06/02/2015   No results found for: KPAFRELGTCHN, LAMBDASER, KAPLAMBRATIO No results found for: IGGSERUM, IGA, IGMSERUM No results found for: Odetta Pink, SPEI   Chemistry      Component Value Date/Time   NA 141 02/17/2019 1027   NA 143 07/16/2017 1057   NA 140 11/29/2016 0936   K 2.8 (LL) 02/17/2019 1027   K 3.2 (L) 07/16/2017 1057   K 4.2 11/29/2016 0936   CL 105 02/17/2019 1027   CL 104 07/16/2017 1057   CO2 28 02/17/2019 1027   CO2 25 07/16/2017 1057   CO2 27 11/29/2016 0936   BUN 6 (L) 02/17/2019 1027   BUN 10 07/16/2017 1057   BUN 18.3 11/29/2016 0936   CREATININE 0.69 02/17/2019 1027   CREATININE 0.8 07/16/2017 1057   CREATININE 0.8 11/29/2016 0936      Component Value Date/Time   CALCIUM 7.7 (L) 02/17/2019 1027   CALCIUM 8.9 07/16/2017 1057   CALCIUM 8.9 11/29/2016 0936   ALKPHOS 106 02/17/2019 1027   ALKPHOS 71 07/16/2017 1057   ALKPHOS 77 11/29/2016 0936   AST 30 02/17/2019 1027   AST 19 11/29/2016 0936   ALT 19 02/17/2019 1027   ALT 27 07/16/2017 1057   ALT 10 11/29/2016 0936   BILITOT 0.4 02/17/2019 1027   BILITOT <0.22 11/29/2016 0936      Impression and Plan: Ms. Belfield is a very pleasant 68 yo caucasian female with essential thrombocythemia.   The problem right now is his left leg swelling.  Again, she would be at a high risk for thromboembolic disease because of her underlying blood disorder and being  on interferon.  If she does have a thrombus, we will have to get her on blood thinner.  I probably would put her on Xarelto.  If there is no blood clot, then she may have a Bakers cyst.  Hopefully, having the interferon to every 2-week interferon will help with her quality of life and will also be  effective.  I spent about 30 minutes with her today.  It was complicated because of the left leg issue.  Also, we had to make a change with her interfere protocol.  I will see her back in 4 weeks.  I will see her back sooner if need be.  [name], MD 7/20/202012:17 PM

## 2019-02-17 NOTE — Telephone Encounter (Signed)
PA initiated via Covermymeds; KEY: [id number]. Awaiting determination.

## 2019-02-17 NOTE — Assessment & Plan Note (Signed)
On Levothyroxine, continue to euthyrox

## 2019-02-17 NOTE — Assessment & Plan Note (Signed)
With calf pain, no injury. She has already been seen by oncology and is awaiting [place] to rule out DVT

## 2019-02-17 NOTE — Assessment & Plan Note (Signed)
hgba1c acceptable, minimize simple carbs. Increase exercise as tolerated.  

## 2019-02-17 NOTE — Progress Notes (Signed)
Virtual Visit via phone Note  I connected with Rachael Johnson on 02/17/19 at  3:20 PM EDT by a phone enabled telemedicine application and verified that I am speaking with the correct person using two identifiers.  Location: Patient: [facility] Provider: home   I discussed the limitations of evaluation and management by telemedicine and the availability of in person appointments. The patient expressed understanding and agreed to proceed. [name] CMA was able to get patient set up on visit, phone as patient was not able to get on video platform.     Subjective:    Patient ID: Rachael Johnson, female    DOB: 13-Nov-1950, 68 y.o.   MRN: [id number]  No chief complaint on file.   HPI Patient is in today for follow up on chronic medical concerns including depression, anxiety, hypothyroidism, and more. She is currently waiting to have an ultrasound performed on her right leg. She was seen by oncology and she noted right calf pain with right foot swelling which began 3 days ago. She denies any trauma or falls. She continues to struggle with fatigue and anhedonia. No suicidal ideation. No recent febrile illlness. Denies CP/palp/SOB/HA/congestion/fevers/GI or GU c/o. Taking meds as prescribed  Past Medical History:  Diagnosis Date  . Acute renal insufficiency 04/09/2017  . Anemia, iron deficiency 07/08/2012  . Arthritis   . Cataract 12/21/2014  . Depression with anxiety 03/24/2011  . Diverticulitis   . Esophageal reflux 08/17/2013  . Essential thrombocythemia ([place]) 01/24/2011  . Frequent episodic tension-type headache   . Gastroenteritis 12/21/2014  . 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 GERD (gastroesophageal reflux disease)   . H. pylori infection 12/19/2012  . Hyperglycemia 12/17/2015  . Hypertension   . Iron deficiency anemia due to chronic blood loss 04/03/2017  . Osteoarthritis 05/24/2017  . Other and unspecified hyperlipidemia 02/25/2013  . Restless leg syndrome 09/30/2014  . Rhabdomyolysis 02/2017   . Scabies 03/28/2015  . Secondary myelofibrosis ([place]) 11/29/2017  . Seizure ([place])    childhood  . Shoulder wound, right, sequela 03/14/2017  . Thyroid disease     Past Surgical History:  Procedure Laterality Date  . ABDOMINAL HYSTERECTOMY     1992  . BREAST CYST ASPIRATION    . BREAST SURGERY  1992   biopsy, benign. Fibrocystic  . UMBILICAL HERNIA REPAIR N/A 09/25/2012   Procedure: HERNIA REPAIR UMBILICAL ADULT;  Surgeon: Harl Bowie, MD;  Location: [facility];  Service: General;  Laterality: N/A;    Family History  Problem Relation Age of Onset  . Arthritis Mother   . Cancer Mother        ovarian  . Hypertension Mother   . Heart disease Mother        pacer  . Heart failure Mother   . Arthritis Father   . Cancer Father        lung  . Hypertension Father   . Cancer Sister        lung  . Cancer Brother        prostate  . Cancer Brother        lung  . Hypertension Son   . COPD Brother   . Heart disease Brother        died from CHF  . Hypertension Brother   . Cancer Brother        colon  . Alcohol abuse Brother   . Cirrhosis Brother   . Seizures Sister   . Stroke Sister   . Arthritis Brother  Social History   Socioeconomic History  . Marital status: Widowed    Spouse name: Not on file  . Number of children: 2  . Years of education: Not on file  . Highest education level: Not on file  Occupational History  . Occupation: APL IT sales professional: [employer]    Comment: [employer] [place]  . Financial resource strain: Not on file  . Food insecurity    Worry: Not on file    Inability: Not on file  . Transportation needs    Medical: Not on file    Non-medical: Not on file  Tobacco Use  . Smoking status: Former Smoker    Packs/day: 3.50    Years: 20.00    Pack years: 70.00    Types: Cigarettes    Start date: 11/15/1970    Quit date: 07/31/1990    Years since quitting: 28.5  . Smokeless tobacco: Never Used  . Tobacco comment: quit 23 years  ago  Substance and Sexual Activity  . Alcohol use: No    Alcohol/week: 0.0 standard drinks  . Drug use: No  . Sexual activity: Never  Lifestyle  . Physical activity    Days per week: Not on file    Minutes per session: Not on file  . Stress: Not on file  Relationships  . Social Herbalist on phone: Not on file    Gets together: Not on file    Attends religious service: Not on file    Active member of club or organization: Not on file    Attends meetings of clubs or organizations: Not on file    Relationship status: Not on file  . Intimate partner violence    Fear of current or ex partner: Not on file    Emotionally abused: Not on file    Physically abused: Not on file    Forced sexual activity: Not on file  Other Topics Concern  . Not on file  Social History Narrative   Regular exercise: no   Caffeine Use: 2-3 weekly    Outpatient Medications Prior to Visit  Medication Sig Dispense Refill  . albuterol (PROVENTIL HFA;VENTOLIN HFA) 108 (90 Base) MCG/ACT inhaler INHALE 2 PUFFS BY MOUTH EVERY 6 HOURS AS NEEDED FOR WHEEZING OR SHORTNESS OF BREATH 54 g 0  . ALPRAZolam (XANAX) 1 MG tablet TAKE 1/2 TO 1 (ONE-HALF TO ONE) TABLET BY MOUTH THREE TIMES DAILY AS NEEDED FOR ANXIETY 90 tablet 1  . atorvastatin (LIPITOR) 10 MG tablet Take 1 tablet (10 mg total) by mouth daily. 90 tablet 3  . budesonide-formoterol (SYMBICORT) 80-4.5 MCG/ACT inhaler Inhale 2 puffs into the lungs 2 (two) times daily. 10.2 g 5  . busPIRone (BUSPAR) 15 MG tablet Take 1 tablet (15 mg total) by mouth 4 (four) times daily as needed. 120 tablet 2  . cetirizine (ZYRTEC) 10 MG tablet Take 1 tablet (10 mg total) by mouth daily. 90 tablet 1  . chlorpheniramine (CHLOR-TRIMETON) 4 MG tablet Take 2 tablets (8 mg total) by mouth at bedtime as needed for allergies. 30 tablet 1  . Cranberry 300 MG tablet Take 300 mg by mouth 2 (two) times daily.    . cyclobenzaprine (FLEXERIL) 10 MG tablet Take 1 tablet (10 mg total)  by mouth daily as needed for muscle spasms. 60 tablet 2  . estradiol (ESTRACE) 0.5 MG tablet Take 1 tablet (0.5 mg total) by mouth 2 (two) times daily. 180 tablet 1  .  EUTHYROX 100 MCG tablet TAKE 1 TABLET BY MOUTH ONCE DAILY BEFORE BREAKFAST 90 tablet 0  . famotidine (PEPCID) 40 MG tablet Take 1 tablet (40 mg total) by mouth daily. 90 tablet 1  . fluconazole (DIFLUCAN) 150 MG tablet Take 1 tablet (150 mg total) by mouth once a week. 2 tablet 1  . fluticasone (FLONASE) 50 MCG/ACT nasal spray Place 2 sprays into both nostrils daily. 48 g 3  . furosemide (LASIX) 20 MG tablet TAKE 1 TABLET BY MOUTH THREE TIMES DAILY AS NEEDED FOR EDEMA OR  FLUID 90 tablet 0  . gabapentin (NEURONTIN) 400 MG capsule Take 1 capsule (400 mg total) by mouth daily. 90 capsule 1  . granisetron (SANCUSO) 3.1 MG/24HR Apply to skin starting 24 hours before chemotherapy. Remove after 7 days. 4 each 4  . magic mouthwash SOLN Take 5 mLs by mouth 3 (three) times daily as needed for mouth pain. Components benadryl  525 mg, hydrocortisone 60 mg and nystatin 0.6 mg. 240 ml - Oral 240 mL 3  . meclizine (ANTIVERT) 12.5 MG tablet TAKE 1 TABLET BY MOUTH THREE TIMES DAILY AS NEEDED FOR DIZZINESS 40 tablet 0  . montelukast (SINGULAIR) 10 MG tablet Take 1 tablet (10 mg total) by mouth at bedtime. 90 tablet 2  . Multiple Vitamin (MULTIVITAMIN PO) Take 1 tablet by mouth every morning.     . nystatin (MYCOSTATIN) 100000 UNIT/ML suspension     . ondansetron (ZOFRAN) 8 MG tablet TAKE 1/2 TO 1 TABLET BY MOUTH EVERY 8 HOURS AS NEEDED FOR NAUSEA AND VOMITING 90 tablet 1  . oxyCODONE-acetaminophen (PERCOCET) 10-325 MG tablet Take 1 tablet by mouth every 6 (six) hours as needed for pain. 120 tablet 0  . peginterferon alfa-2a (PEGASYS) 180 MCG/ML injection Inject 45 mcg into the skin every 7 (seven) days.    . penicillin v potassium (VEETID) 500 MG tablet Take 500 mg by mouth every 8 (eight) hours.    . RESTASIS 0.05 % ophthalmic emulsion PLACE ONE  DROP INTO EACH EYE EVERY 12 HOURS 0.4 mL 3  . SSD 1 % cream APPLY TOPICALLY DAILY 50 g 0  . TUBERCULIN SYR 1CC/27GX1/2" 27G X 1/2" 1 ML MISC Use as directed once weekly for peg-interferon injections 4 each 11  . azithromycin (ZITHROMAX) 250 MG tablet 2 tabs po once and then 1 tab po daily x 4 days 6 tablet 0  . meclizine (ANTIVERT) 12.5 MG tablet TAKE 1 TABLET BY MOUTH THREE TIMES DAILY AS NEEDED FOR DIZZINESS 40 tablet 0  . traZODone (DESYREL) 100 MG tablet Take 1 tablet (100 mg total) by mouth at bedtime. 30 tablet 1  . venlafaxine XR (EFFEXOR-XR) 150 MG 24 hr capsule Take 1 capsule (150 mg total) by mouth daily with breakfast. 90 capsule 1   No facility-administered medications prior to visit.     No Known Allergies  Review of Systems  Constitutional: Positive for malaise/fatigue. Negative for fever.  HENT: Negative for congestion.   Eyes: Negative for blurred vision.  Respiratory: Negative for shortness of breath.   Cardiovascular: Positive for leg swelling. Negative for chest pain and palpitations.  Gastrointestinal: Negative for abdominal pain, blood in stool and nausea.  Genitourinary: Negative for dysuria and frequency.  Musculoskeletal: Positive for joint pain. Negative for falls.  Skin: Negative for rash.  Neurological: Negative for dizziness, loss of consciousness and headaches.  Endo/Heme/Allergies: Negative for environmental allergies.  Psychiatric/Behavioral: Positive for depression. The patient is nervous/anxious.  Objective:    Physical Exam unable to obtain via phone visit  LMP 07/31/1990  Wt Readings from Last 3 Encounters:  02/17/19 166 lb (75.3 kg)  01/28/19 158 lb (71.7 kg)  01/20/19 159 lb (72.1 kg)    Diabetic Foot Exam - Simple   No data filed     Lab Results  Component Value Date   WBC 3.6 (L) 02/17/2019   HGB 9.8 (L) 02/17/2019   HCT 30.5 (L) 02/17/2019   PLT 486 (H) 02/17/2019   GLUCOSE 122 (H) 02/17/2019   CHOL 236 (H) 05/06/2018    TRIG 322.0 (H) 05/06/2018   HDL 59.00 05/06/2018   LDLDIRECT 95.0 05/06/2018   LDLCALC 82 02/12/2017   ALT 19 02/17/2019   AST 30 02/17/2019   NA 141 02/17/2019   K 2.8 (LL) 02/17/2019   CL 105 02/17/2019   CREATININE 0.69 02/17/2019   BUN 6 (L) 02/17/2019   CO2 28 02/17/2019   TSH 0.134 (L) 02/17/2019   INR 0.94 09/16/2018   HGBA1C 5.0 08/10/2017    Lab Results  Component Value Date   TSH 0.134 (L) 02/17/2019   Lab Results  Component Value Date   WBC 3.6 (L) 02/17/2019   HGB 9.8 (L) 02/17/2019   HCT 30.5 (L) 02/17/2019   MCV 106.3 (H) 02/17/2019   PLT 486 (H) 02/17/2019   Lab Results  Component Value Date   NA 141 02/17/2019   K 2.8 (LL) 02/17/2019   CHLORIDE 105 11/29/2016   CO2 28 02/17/2019   GLUCOSE 122 (H) 02/17/2019   BUN 6 (L) 02/17/2019   CREATININE 0.69 02/17/2019   BILITOT 0.4 02/17/2019   ALKPHOS 106 02/17/2019   AST 30 02/17/2019   ALT 19 02/17/2019   PROT 6.3 (L) 02/17/2019   ALBUMIN 3.5 02/17/2019   CALCIUM 7.7 (L) 02/17/2019   ANIONGAP 8 02/17/2019   EGFR 80 (L) 11/29/2016   GFR 62.50 05/06/2018   Lab Results  Component Value Date   CHOL 236 (H) 05/06/2018   Lab Results  Component Value Date   HDL 59.00 05/06/2018   Lab Results  Component Value Date   LDLCALC 82 02/12/2017   Lab Results  Component Value Date   TRIG 322.0 (H) 05/06/2018   Lab Results  Component Value Date   CHOLHDL 4 05/06/2018   Lab Results  Component Value Date   HGBA1C 5.0 08/10/2017       Assessment & Plan:   Problem List Items Addressed This Visit    Hypothyroid    On Levothyroxine, continue to euthyrox      Depression with anxiety    Still struggling with anhedonia and we will try increasing Venlafaxine to 75 mg po tid and reassess. Report any concerns      Relevant Medications   venlafaxine XR (EFFEXOR XR) 75 MG 24 hr capsule   Hyperglycemia    hgba1c acceptable, minimize simple carbs. Increase exercise as tolerated.       Essential  hypertension     no changes to meds. Encouraged heart healthy diet such as the DASH diet and exercise as tolerated.       Insomnia    Did not respond to trazodone will switch to doxepin and reassess. Encouraged good sleep hygiene such as dark, quiet room. No blue/green glowing lights such as computer screens in bedroom. No alcohol or stimulants in evening. Cut down on caffeine as able. Regular exercise is helpful but not just prior to bed time.  Swelling of right foot    With calf pain, no injury. She has already been seen by oncology and is awaiting [place] to rule out DVT         I have discontinued Aamya D. Tsui's azithromycin, traZODone, and venlafaxine XR. I am also having her start on Doxepin HCl and venlafaxine XR. Additionally, I am having her maintain her Multiple Vitamin (MULTIVITAMIN PO), Cranberry, budesonide-formoterol, magic mouthwash, nystatin, fluticasone, montelukast, fluconazole, ondansetron, TUBERCULIN SYR 1CC/27GX1/2", peginterferon alfa-2a, albuterol, granisetron, chlorpheniramine, Restasis, Euthyrox, SSD, oxyCODONE-acetaminophen, ALPRAZolam, cyclobenzaprine, busPIRone, gabapentin, cetirizine, estradiol, famotidine, atorvastatin, furosemide, meclizine, and penicillin v potassium.  Meds ordered this encounter  Medications  . Doxepin HCl 6 MG TABS    Sig: Take 1 tablet (6 mg total) by mouth at bedtime.    Dispense:  30 tablet    Refill:  1  . venlafaxine XR (EFFEXOR XR) 75 MG 24 hr capsule    Sig: Take 1 capsule (75 mg total) by mouth 3 (three) times daily.    Dispense:  90 capsule    Refill:  2     I discussed the assessment and treatment plan with the patient. The patient was provided an opportunity to ask questions and all were answered. The patient agreed with the plan and demonstrated an understanding of the instructions.   The patient was advised to call back or seek an in-person evaluation if the symptoms worsen or if the condition fails to improve as  anticipated.  I provided 25 minutes of non-face-to-face time during this encounter.   Penni Homans, MD

## 2019-02-17 NOTE — Assessment & Plan Note (Signed)
Still struggling with anhedonia and we will try increasing Venlafaxine to 75 mg po tid and reassess. Report any concerns

## 2019-02-17 NOTE — Assessment & Plan Note (Signed)
Did not respond to trazodone will switch to doxepin and reassess. Encouraged good sleep hygiene such as dark, quiet room. No blue/green glowing lights such as computer screens in bedroom. No alcohol or stimulants in evening. Cut down on caffeine as able. Regular exercise is helpful but not just prior to bed time.

## 2019-02-17 NOTE — Telephone Encounter (Signed)
Rachael Johnson from lab brought me a critical Potassium level of 2.8. MD notified.

## 2019-02-17 NOTE — Assessment & Plan Note (Signed)
no changes to meds. Encouraged heart healthy diet such as the DASH diet and exercise as tolerated.  

## 2019-02-18 ENCOUNTER — Telehealth: Payer: Self-pay | Admitting: *Deleted

## 2019-02-18 NOTE — Telephone Encounter (Signed)
Notified pt of results. No concerns at this time, pt advised she will try the ankle brace

## 2019-02-18 NOTE — Telephone Encounter (Signed)
-----   Message from [name], MD sent at 02/17/2019  5:39 PM EDT ----- Call -- NO blood clot in the left leg!!  Try an ankle brace for the swelling.  You may need to see an orthopedist.  Laurey Arrow

## 2019-02-19 MED ORDER — DOXEPIN HCL 3 MG PO TABS: 3.0000 mg | ORAL_TABLET | Freq: Every day | ORAL | 2 refills | Status: DC

## 2019-02-19 NOTE — Telephone Encounter (Signed)
Please let her know not covered and switch to Silenor 3 mg po qhs, disp #30 with 2 rf

## 2019-02-19 NOTE — Telephone Encounter (Signed)
PA denied. Not a covered benefit on plan. Preferred alternatives: Belsomra, Temazepam 7.5mg , and 15mg  (w/ prior auth), zolpidem 5mg , 10mg , and Silenor 3mg , 6mg .

## 2019-02-19 NOTE — Telephone Encounter (Signed)
Sent in new script  

## 2019-02-25 MED FILL — PEGASYS 180 MCG/ML VIAL: 180 | 28 days supply | Qty: 4 | Fill #5

## 2019-02-25 MED FILL — BD TB SYRINGE 27GX1/2: 27G X 1/2" | 28 days supply | Qty: 4 | Fill #5

## 2019-02-28 ENCOUNTER — Other Ambulatory Visit: Payer: Self-pay | Admitting: Family Medicine

## 2019-02-28 DIAGNOSIS — G8929 Other chronic pain: Secondary | ICD-10-CM

## 2019-02-28 NOTE — Telephone Encounter (Signed)
Medication Refill - Medication: oxyCODONE-acetaminophen (PERCOCET) 10-325 MG tablet  Patient stated she is all out of the medication and needs as soon as possible.  Preferred Pharmacy (with phone number or street name):  [facility] [address], [place] - [place]  [address] [phone] (Phone) [phone] (Fax)

## 2019-03-03 ENCOUNTER — Ambulatory Visit (INDEPENDENT_AMBULATORY_CARE_PROVIDER_SITE_OTHER): Payer: Medicare Other | Admitting: Family Medicine

## 2019-03-03 ENCOUNTER — Other Ambulatory Visit: Payer: Self-pay

## 2019-03-03 ENCOUNTER — Encounter: Payer: Self-pay | Admitting: Family Medicine

## 2019-03-03 VITALS — BP 97/80 | HR 112 | Temp 97.9°F | Wt 164.0 lb

## 2019-03-03 DIAGNOSIS — Z1239 Encounter for other screening for malignant neoplasm of breast: Secondary | ICD-10-CM

## 2019-03-03 DIAGNOSIS — M549 Dorsalgia, unspecified: Secondary | ICD-10-CM

## 2019-03-03 DIAGNOSIS — I10 Essential (primary) hypertension: Secondary | ICD-10-CM

## 2019-03-03 DIAGNOSIS — G8929 Other chronic pain: Secondary | ICD-10-CM | POA: Diagnosis not present

## 2019-03-03 DIAGNOSIS — R739 Hyperglycemia, unspecified: Secondary | ICD-10-CM

## 2019-03-03 DIAGNOSIS — F418 Other specified anxiety disorders: Secondary | ICD-10-CM | POA: Diagnosis not present

## 2019-03-03 MED ORDER — OXYCODONE-ACETAMINOPHEN 10-325 MG PO TABS: 1.0000 | ORAL_TABLET | Freq: Four times a day (QID) | ORAL | 0 refills | Status: DC | PRN

## 2019-03-03 NOTE — Assessment & Plan Note (Signed)
She is feeling better with change in medications. Will continue to monitor.

## 2019-03-03 NOTE — Progress Notes (Signed)
Virtual Visit via phone Note  I connected with Rachael Johnson on 03/03/19 at  2:20 PM EDT by a phone enabled telemedicine application and verified that I am speaking with the correct person using two identifiers.  Location: Patient: home Provider: office   I discussed the limitations of evaluation and management by telemedicine and the availability of in person appointments. The patient expressed understanding and agreed to proceed. Magdalene Molly, CMA was able to get patient set up on visit, phone after being unable to complete video   Subjective:    Patient ID: Rachael Johnson, female    DOB: 1950-12-15, 68 y.o.   MRN: [id number]  Chief Complaint  Patient presents with  . Follow-up    two week follow up     HPI Patient is in today for follow up on depression, anxiety, hypertension and more. No recent febrile illness or hospitalizations. No polyuria or polydipsia. Her depression is some better today and while some anhedonia is noted it is better and no suicidal ideation. No new concerns. Denies CP/palp/SOB/HA/congestion/fevers/GI or GU c/o. Taking meds as prescribed  Past Medical History:  Diagnosis Date  . Acute renal insufficiency 04/09/2017  . Anemia, iron deficiency 07/08/2012  . Arthritis   . Cataract 12/21/2014  . Depression with anxiety 03/24/2011  . Diverticulitis   . Esophageal reflux 08/17/2013  . Essential thrombocythemia ([place]) 01/24/2011  . Frequent episodic tension-type headache   . Gastroenteritis 12/21/2014  . 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 GERD (gastroesophageal reflux disease)   . H. pylori infection 12/19/2012  . Hyperglycemia 12/17/2015  . Hypertension   . Iron deficiency anemia due to chronic blood loss 04/03/2017  . Osteoarthritis 05/24/2017  . Other and unspecified hyperlipidemia 02/25/2013  . Restless leg syndrome 09/30/2014  . Rhabdomyolysis 02/2017  . Scabies 03/28/2015  . 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 Seizure ([place])    childhood  . Shoulder wound,  right, sequela 03/14/2017  . Thyroid disease     Past Surgical History:  Procedure Laterality Date  . ABDOMINAL HYSTERECTOMY     1992  . BREAST CYST ASPIRATION    . BREAST SURGERY  1992   biopsy, benign. Fibrocystic  . UMBILICAL HERNIA REPAIR N/A 09/25/2012   Procedure: HERNIA REPAIR UMBILICAL ADULT;  Surgeon: Harl Bowie, MD;  Location: [facility];  Service: General;  Laterality: N/A;    Family History  Problem Relation Age of Onset  . Arthritis Mother   . Cancer Mother        ovarian  . Hypertension Mother   . Heart disease Mother        pacer  . Heart failure Mother   . Arthritis Father   . Cancer Father        lung  . Hypertension Father   . Cancer Sister        lung  . Cancer Brother        prostate  . Cancer Brother        lung  . Hypertension Son   . COPD Brother   . Heart disease Brother        died from CHF  . Hypertension Brother   . Cancer Brother        colon  . Alcohol abuse Brother   . Cirrhosis Brother   . Seizures Sister   . Stroke Sister   . Arthritis Brother     Social History   Socioeconomic History  . Marital status: Widowed    Spouse  name: Not on file  . Number of children: 2  . Years of education: Not on file  . Highest education level: Not on file  Occupational History  . Occupation: APL IT sales professional: [employer]    Comment: [place]  . Financial resource strain: Not on file  . Food insecurity    Worry: Not on file    Inability: Not on file  . Transportation needs    Medical: Not on file    Non-medical: Not on file  Tobacco Use  . Smoking status: Former Smoker    Packs/day: 3.50    Years: 20.00    Pack years: 70.00    Types: Cigarettes    Start date: 11/15/1970    Quit date: 07/31/1990    Years since quitting: 28.6  . Smokeless tobacco: Never Used  . Tobacco comment: quit 23 years ago  Substance and Sexual Activity  . Alcohol use: No    Alcohol/week: 0.0 standard drinks  . Drug use: No  .  Sexual activity: Never  Lifestyle  . Physical activity    Days per week: Not on file    Minutes per session: Not on file  . Stress: Not on file  Relationships  . Social Herbalist on phone: Not on file    Gets together: Not on file    Attends religious service: Not on file    Active member of club or organization: Not on file    Attends meetings of clubs or organizations: Not on file    Relationship status: Not on file  . Intimate partner violence    Fear of current or ex partner: Not on file    Emotionally abused: Not on file    Physically abused: Not on file    Forced sexual activity: Not on file  Other Topics Concern  . Not on file  Social History Narrative   Regular exercise: no   Caffeine Use: 2-3 weekly    Outpatient Medications Prior to Visit  Medication Sig Dispense Refill  . albuterol (PROVENTIL HFA;VENTOLIN HFA) 108 (90 Base) MCG/ACT inhaler INHALE 2 PUFFS BY MOUTH EVERY 6 HOURS AS NEEDED FOR WHEEZING OR SHORTNESS OF BREATH 54 g 0  . ALPRAZolam (XANAX) 1 MG tablet TAKE 1/2 TO 1 (ONE-HALF TO ONE) TABLET BY MOUTH THREE TIMES DAILY AS NEEDED FOR ANXIETY 90 tablet 1  . atorvastatin (LIPITOR) 10 MG tablet Take 1 tablet (10 mg total) by mouth daily. 90 tablet 3  . budesonide-formoterol (SYMBICORT) 80-4.5 MCG/ACT inhaler Inhale 2 puffs into the lungs 2 (two) times daily. 10.2 g 5  . busPIRone (BUSPAR) 15 MG tablet Take 1 tablet (15 mg total) by mouth 4 (four) times daily as needed. 120 tablet 2  . cetirizine (ZYRTEC) 10 MG tablet Take 1 tablet (10 mg total) by mouth daily. 90 tablet 1  . chlorpheniramine (CHLOR-TRIMETON) 4 MG tablet Take 2 tablets (8 mg total) by mouth at bedtime as needed for allergies. 30 tablet 1  . Cranberry 300 MG tablet Take 300 mg by mouth 2 (two) times daily.    . cyclobenzaprine (FLEXERIL) 10 MG tablet Take 1 tablet (10 mg total) by mouth daily as needed for muscle spasms. 60 tablet 2  . Doxepin HCl 3 MG TABS Take 1 tablet (3 mg total) by  mouth at bedtime. 30 tablet 2  . estradiol (ESTRACE) 0.5 MG tablet Take 1 tablet (0.5 mg total) by mouth 2 (two) times daily.  180 tablet 1  . EUTHYROX 100 MCG tablet TAKE 1 TABLET BY MOUTH ONCE DAILY BEFORE BREAKFAST 90 tablet 0  . famotidine (PEPCID) 40 MG tablet Take 1 tablet (40 mg total) by mouth daily. 90 tablet 1  . fluconazole (DIFLUCAN) 150 MG tablet Take 1 tablet (150 mg total) by mouth once a week. 2 tablet 1  . fluticasone (FLONASE) 50 MCG/ACT nasal spray Place 2 sprays into both nostrils daily. 48 g 3  . furosemide (LASIX) 20 MG tablet TAKE 1 TABLET BY MOUTH THREE TIMES DAILY AS NEEDED FOR EDEMA OR  FLUID 90 tablet 0  . gabapentin (NEURONTIN) 400 MG capsule Take 1 capsule (400 mg total) by mouth daily. 90 capsule 1  . granisetron (SANCUSO) 3.1 MG/24HR Apply to skin starting 24 hours before chemotherapy. Remove after 7 days. 4 each 4  . magic mouthwash SOLN Take 5 mLs by mouth 3 (three) times daily as needed for mouth pain. Components benadryl  525 mg, hydrocortisone 60 mg and nystatin 0.6 mg. 240 ml - Oral 240 mL 3  . meclizine (ANTIVERT) 12.5 MG tablet TAKE 1 TABLET BY MOUTH THREE TIMES DAILY AS NEEDED FOR DIZZINESS 40 tablet 0  . montelukast (SINGULAIR) 10 MG tablet Take 1 tablet (10 mg total) by mouth at bedtime. 90 tablet 2  . Multiple Vitamin (MULTIVITAMIN PO) Take 1 tablet by mouth every morning.     . nystatin (MYCOSTATIN) 100000 UNIT/ML suspension     . ondansetron (ZOFRAN) 8 MG tablet TAKE 1/2 TO 1 TABLET BY MOUTH EVERY 8 HOURS AS NEEDED FOR NAUSEA AND VOMITING 90 tablet 1  . peginterferon alfa-2a (PEGASYS) 180 MCG/ML injection Inject 45 mcg into the skin every 7 (seven) days.    . penicillin v potassium (VEETID) 500 MG tablet Take 500 mg by mouth every 8 (eight) hours.    . RESTASIS 0.05 % ophthalmic emulsion PLACE ONE DROP INTO EACH EYE EVERY 12 HOURS 0.4 mL 3  . SSD 1 % cream APPLY TOPICALLY DAILY 50 g 0  . TUBERCULIN SYR 1CC/27GX1/2" 27G X 1/2" 1 ML MISC Use as directed  once weekly for peg-interferon injections 4 each 11  . venlafaxine XR (EFFEXOR XR) 75 MG 24 hr capsule Take 1 capsule (75 mg total) by mouth 3 (three) times daily. 90 capsule 2  . oxyCODONE-acetaminophen (PERCOCET) 10-325 MG tablet Take 1 tablet by mouth every 6 (six) hours as needed for pain. 120 tablet 0  . oxyCODONE-acetaminophen (PERCOCET) 10-325 MG tablet Take 1 tablet by mouth every 6 (six) hours as needed for pain. 120 tablet 0   No facility-administered medications prior to visit.     No Known Allergies  Review of Systems  Constitutional: Positive for malaise/fatigue. Negative for fever.  HENT: Negative for congestion.   Eyes: Negative for blurred vision.  Respiratory: Negative for shortness of breath.   Cardiovascular: Negative for chest pain, palpitations and leg swelling.  Gastrointestinal: Negative for abdominal pain, blood in stool and nausea.  Genitourinary: Negative for dysuria and frequency.  Musculoskeletal: Positive for back pain and joint pain. Negative for falls.  Skin: Negative for rash.  Neurological: Negative for dizziness, loss of consciousness and headaches.  Endo/Heme/Allergies: Negative for environmental allergies.  Psychiatric/Behavioral: Positive for depression. The patient has insomnia. The patient is not nervous/anxious.        Objective:    Physical Exam unable to obtain via phone.   BP 97/80   Pulse (!) 112   Temp 97.9 F (36.6 C) (Oral)  Wt 164 lb (74.4 kg)   LMP 07/31/1990   BMI 29.05 kg/m  Wt Readings from Last 3 Encounters:  03/03/19 164 lb (74.4 kg)  02/17/19 166 lb (75.3 kg)  01/28/19 158 lb (71.7 kg)    Diabetic Foot Exam - Simple   No data filed     Lab Results  Component Value Date   WBC 3.6 (L) 02/17/2019   HGB 9.8 (L) 02/17/2019   HCT 30.5 (L) 02/17/2019   PLT 486 (H) 02/17/2019   GLUCOSE 122 (H) 02/17/2019   CHOL 236 (H) 05/06/2018   TRIG 322.0 (H) 05/06/2018   HDL 59.00 05/06/2018   LDLDIRECT 95.0 05/06/2018    LDLCALC 82 02/12/2017   ALT 19 02/17/2019   AST 30 02/17/2019   NA 141 02/17/2019   K 2.8 (LL) 02/17/2019   CL 105 02/17/2019   CREATININE 0.69 02/17/2019   BUN 6 (L) 02/17/2019   CO2 28 02/17/2019   TSH 0.134 (L) 02/17/2019   INR 0.94 09/16/2018   HGBA1C 5.0 08/10/2017    Lab Results  Component Value Date   TSH 0.134 (L) 02/17/2019   Lab Results  Component Value Date   WBC 3.6 (L) 02/17/2019   HGB 9.8 (L) 02/17/2019   HCT 30.5 (L) 02/17/2019   MCV 106.3 (H) 02/17/2019   PLT 486 (H) 02/17/2019   Lab Results  Component Value Date   NA 141 02/17/2019   K 2.8 (LL) 02/17/2019   CHLORIDE 105 11/29/2016   CO2 28 02/17/2019   GLUCOSE 122 (H) 02/17/2019   BUN 6 (L) 02/17/2019   CREATININE 0.69 02/17/2019   BILITOT 0.4 02/17/2019   ALKPHOS 106 02/17/2019   AST 30 02/17/2019   ALT 19 02/17/2019   PROT 6.3 (L) 02/17/2019   ALBUMIN 3.5 02/17/2019   CALCIUM 7.7 (L) 02/17/2019   ANIONGAP 8 02/17/2019   EGFR 80 (L) 11/29/2016   GFR 62.50 05/06/2018   Lab Results  Component Value Date   CHOL 236 (H) 05/06/2018   Lab Results  Component Value Date   HDL 59.00 05/06/2018   Lab Results  Component Value Date   LDLCALC 82 02/12/2017   Lab Results  Component Value Date   TRIG 322.0 (H) 05/06/2018   Lab Results  Component Value Date   CHOLHDL 4 05/06/2018   Lab Results  Component Value Date   HGBA1C 5.0 08/10/2017       Assessment & Plan:   Problem List Items Addressed This Visit    Depression with anxiety    She is feeling better with change in medications. Will continue to monitor.      Hyperglycemia    Eating better and no c/o polyuria or polydipsia      Essential hypertension     no changes to meds. Encouraged heart healthy diet such as the DASH diet and exercise as tolerated.       Chronic back pain    Continues to struggle with daily pain but does get some relief from medications, refill allowed today.       Relevant Medications    oxyCODONE-acetaminophen (PERCOCET) 10-325 MG tablet    Other Visit Diagnoses    Breast cancer screening    -  Primary   Relevant Orders   MM 3D SCREEN BREAST BILATERAL      I am having Rachael Johnson maintain her Multiple Vitamin (MULTIVITAMIN PO), Cranberry, budesonide-formoterol, magic mouthwash, nystatin, fluticasone, montelukast, fluconazole, ondansetron, TUBERCULIN SYR 1CC/27GX1/2", peginterferon alfa-2a, albuterol, granisetron, chlorpheniramine,  Restasis, Euthyrox, SSD, ALPRAZolam, cyclobenzaprine, busPIRone, gabapentin, cetirizine, estradiol, famotidine, atorvastatin, furosemide, meclizine, penicillin v potassium, venlafaxine XR, Doxepin HCl, and oxyCODONE-acetaminophen.  Meds ordered this encounter  Medications  . oxyCODONE-acetaminophen (PERCOCET) 10-325 MG tablet    Sig: Take 1 tablet by mouth every 6 (six) hours as needed for pain.    Dispense:  120 tablet    Refill:  0     I discussed the assessment and treatment plan with the patient. The patient was provided an opportunity to ask questions and all were answered. The patient agreed with the plan and demonstrated an understanding of the instructions.   The patient was advised to call back or seek an in-person evaluation if the sym ptoms worsen or if the condition fails to improve as anticipated.  I provided 25 minutes of non-face-to-face time during this encounter.   Penni Homans, MD

## 2019-03-03 NOTE — Telephone Encounter (Signed)
Pt is requesting refill urgently, she is out of her medication. She says she would not be able to walk without it.

## 2019-03-03 NOTE — Assessment & Plan Note (Signed)
Continues to struggle with daily pain but does get some relief from medications, refill allowed today.

## 2019-03-03 NOTE — Assessment & Plan Note (Signed)
Eating better and no c/o polyuria or polydipsia

## 2019-03-03 NOTE — Assessment & Plan Note (Signed)
no changes to meds. Encouraged heart healthy diet such as the DASH diet and exercise as tolerated.  

## 2019-03-04 NOTE — Telephone Encounter (Signed)
Medication refilled

## 2019-03-08 ENCOUNTER — Other Ambulatory Visit: Payer: Self-pay | Admitting: Family Medicine

## 2019-03-08 DIAGNOSIS — G8929 Other chronic pain: Secondary | ICD-10-CM

## 2019-03-17 ENCOUNTER — Other Ambulatory Visit: Payer: Self-pay

## 2019-03-17 ENCOUNTER — Telehealth: Payer: Self-pay | Admitting: Hematology & Oncology

## 2019-03-17 ENCOUNTER — Inpatient Hospital Stay: Payer: Medicare Other | Attending: Hematology & Oncology | Admitting: Hematology & Oncology

## 2019-03-17 ENCOUNTER — Inpatient Hospital Stay: Payer: Medicare Other

## 2019-03-17 ENCOUNTER — Encounter: Payer: Self-pay | Admitting: Hematology & Oncology

## 2019-03-17 VITALS — BP 149/91 | HR 94 | Temp 97.1°F | Resp 18 | Wt 159.0 lb

## 2019-03-17 DIAGNOSIS — R12 Heartburn: Secondary | ICD-10-CM | POA: Diagnosis not present

## 2019-03-17 DIAGNOSIS — D7581 Myelofibrosis: Secondary | ICD-10-CM | POA: Insufficient documentation

## 2019-03-17 DIAGNOSIS — R61 Generalized hyperhidrosis: Secondary | ICD-10-CM | POA: Insufficient documentation

## 2019-03-17 DIAGNOSIS — D500 Iron deficiency anemia secondary to blood loss (chronic): Secondary | ICD-10-CM

## 2019-03-17 DIAGNOSIS — D509 Iron deficiency anemia, unspecified: Secondary | ICD-10-CM | POA: Diagnosis not present

## 2019-03-17 DIAGNOSIS — R109 Unspecified abdominal pain: Secondary | ICD-10-CM | POA: Diagnosis not present

## 2019-03-17 DIAGNOSIS — D473 Essential (hemorrhagic) thrombocythemia: Secondary | ICD-10-CM

## 2019-03-17 DIAGNOSIS — M791 Myalgia, unspecified site: Secondary | ICD-10-CM | POA: Diagnosis not present

## 2019-03-17 DIAGNOSIS — R5383 Other fatigue: Secondary | ICD-10-CM | POA: Insufficient documentation

## 2019-03-17 DIAGNOSIS — R42 Dizziness and giddiness: Secondary | ICD-10-CM | POA: Diagnosis not present

## 2019-03-17 DIAGNOSIS — Z79899 Other long term (current) drug therapy: Secondary | ICD-10-CM | POA: Diagnosis not present

## 2019-03-17 DIAGNOSIS — R0602 Shortness of breath: Secondary | ICD-10-CM | POA: Insufficient documentation

## 2019-03-17 DIAGNOSIS — R110 Nausea: Secondary | ICD-10-CM | POA: Diagnosis not present

## 2019-03-17 DIAGNOSIS — M2550 Pain in unspecified joint: Secondary | ICD-10-CM | POA: Insufficient documentation

## 2019-03-17 DIAGNOSIS — D75839 Thrombocytosis, unspecified: Secondary | ICD-10-CM

## 2019-03-17 LAB — CBC WITH DIFFERENTIAL (CANCER CENTER ONLY)
Abs Immature Granulocytes: 0.02 10*3/uL (ref 0.00–0.07)
Basophils Absolute: 0 10*3/uL (ref 0.0–0.1)
Basophils Relative: 0 %
Eosinophils Absolute: 0.4 10*3/uL (ref 0.0–0.5)
Eosinophils Relative: 7 %
HCT: 37.5 % (ref 36.0–46.0)
Hemoglobin: 11.8 g/dL — ABNORMAL LOW (ref 12.0–15.0)
Immature Granulocytes: 0 %
Lymphocytes Relative: 16 %
Lymphs Abs: 0.8 10*3/uL (ref 0.7–4.0)
MCH: 33.2 pg (ref 26.0–34.0)
MCHC: 31.5 g/dL (ref 30.0–36.0)
MCV: 105.6 fL — ABNORMAL HIGH (ref 80.0–100.0)
Monocytes Absolute: 0.5 10*3/uL (ref 0.1–1.0)
Monocytes Relative: 8 %
Neutro Abs: 3.7 10*3/uL (ref 1.7–7.7)
Neutrophils Relative %: 69 %
Platelet Count: 705 10*3/uL — ABNORMAL HIGH (ref 150–400)
RBC: 3.55 MIL/uL — ABNORMAL LOW (ref 3.87–5.11)
RDW: 14.2 % (ref 11.5–15.5)
WBC Count: 5.3 10*3/uL (ref 4.0–10.5)
nRBC: 0 % (ref 0.0–0.2)

## 2019-03-17 LAB — CMP (CANCER CENTER ONLY)
ALT: 17 U/L (ref 0–44)
AST: 24 U/L (ref 15–41)
Albumin: 3.7 g/dL (ref 3.5–5.0)
Alkaline Phosphatase: 114 U/L (ref 38–126)
Anion gap: 9 (ref 5–15)
BUN: 11 mg/dL (ref 8–23)
CO2: 29 mmol/L (ref 22–32)
Calcium: 9 mg/dL (ref 8.9–10.3)
Chloride: 98 mmol/L (ref 98–111)
Creatinine: 0.82 mg/dL (ref 0.44–1.00)
GFR, Est AFR Am: >60 mL/min (ref >60)
GFR, Estimated: >60 mL/min (ref >60)
Glucose, Bld: 105 mg/dL — ABNORMAL HIGH (ref 70–99)
Potassium: 3.6 mmol/L (ref 3.5–5.1)
Sodium: 136 mmol/L (ref 135–145)
Total Bilirubin: 0.4 mg/dL (ref 0.3–1.2)
Total Protein: 7.1 g/dL (ref 6.5–8.1)

## 2019-03-17 LAB — LACTATE DEHYDROGENASE: LDH: 263 U/L — ABNORMAL HIGH (ref 98–192)

## 2019-03-17 NOTE — Progress Notes (Signed)
Hematology and Oncology Follow Up Visit  GOLDYE TOURANGEAU [id number] 09-07-1950 68 y.o. 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 Aulds is back for follow-up.  Unfortunately, we do not have to go back to weekly interferon.  Her platelet count went up to 705,000.  I really thought that we could get away with every 2-week interferon.  Unfortunately, her bone marrow just did not get enough suppression with every 2-week dosing.  She feels okay.  She had a feeling that her platelet count was on the higher side.  She has had no bleeding.  There is been no diarrhea.  She has had no problems with further leg swelling.  The last time she was here, we did a Doppler of her left leg.  Everything looked fine with no evidence of thromboembolic disease.    Her TSH has been doing okay.  There is no evidence of hypothyroidism from the interferon.  Overall, her performance status is ECOG 1.    Medications:  Allergies as of 03/17/2019   No Known Allergies     Medication List       Accurate as of March 17, 2019 12:21 PM. If you have any questions, ask your nurse or doctor.        albuterol 108 (90 Base) MCG/ACT inhaler Commonly known as: VENTOLIN HFA INHALE 2 PUFFS BY MOUTH EVERY 6 HOURS AS NEEDED FOR WHEEZING OR SHORTNESS OF BREATH   ALPRAZolam 1 MG tablet Commonly known as: XANAX TAKE 1/2 TO 1 (ONE-HALF TO ONE) TABLET BY MOUTH THREE TIMES DAILY AS NEEDED FOR ANXIETY   atorvastatin 10 MG tablet Commonly known as: LIPITOR Take 1 tablet (10 mg total) by mouth daily.   budesonide-formoterol 80-4.5 MCG/ACT  inhaler Commonly known as: Symbicort Inhale 2 puffs into the lungs 2 (two) times daily.   busPIRone 15 MG tablet Commonly known as: BUSPAR Take 1 tablet (15 mg total) by mouth 4 (four) times daily as needed.   cetirizine 10 MG tablet Commonly known as: ZYRTEC Take 1 tablet (10 mg total) by mouth daily.   chlorpheniramine 4 MG tablet Commonly known as: CHLOR-TRIMETON Take 2 tablets (8 mg total) by mouth at bedtime as needed for allergies.   Cranberry 300 MG tablet Take 300 mg by mouth 2 (two) times daily.   cyclobenzaprine 10 MG tablet Commonly known as: FLEXERIL TAKE 1 TABLET BY MOUTH TWICE DAILY AS NEEDED FOR MUSCLE SPASM   Doxepin HCl 3 MG Tabs Take 1 tablet (3 mg total) by mouth at bedtime.   estradiol 0.5 MG tablet Commonly known as: ESTRACE Take 1 tablet (0.5 mg total) by mouth 2 (two) times daily.   Euthyrox 100 MCG tablet Generic drug: levothyroxine TAKE 1 TABLET BY MOUTH ONCE DAILY BEFORE BREAKFAST   famotidine 40 MG tablet Commonly known as: PEPCID Take 1 tablet (40 mg total) by mouth daily.   fluconazole 150 MG tablet Commonly known as: DIFLUCAN Take 1 tablet (150 mg total) by mouth once a week.   fluticasone 50 MCG/ACT nasal spray Commonly known as: FLONASE Place 2 sprays into both nostrils daily.   furosemide 20  MG tablet Commonly known as: LASIX TAKE 1 TABLET BY MOUTH THREE TIMES DAILY AS NEEDED FOR EDEMA OR  FLUID   gabapentin 400 MG capsule Commonly known as: NEURONTIN Take 1 capsule (400 mg total) by mouth daily.   granisetron 3.1 MG/24HR Commonly known as: SANCUSO Apply to skin starting 24 hours before chemotherapy. Remove after 7 days.   magic mouthwash Soln Take 5 mLs by mouth 3 (three) times daily as needed for mouth pain. Components benadryl  525 mg, hydrocortisone 60 mg and nystatin 0.6 mg. 240 ml - Oral   meclizine 12.5 MG tablet Commonly known as: ANTIVERT TAKE 1 TABLET BY MOUTH THREE TIMES DAILY AS NEEDED FOR DIZZINESS    montelukast 10 MG tablet Commonly known as: SINGULAIR Take 1 tablet (10 mg total) by mouth at bedtime.   MULTIVITAMIN PO Take 1 tablet by mouth every morning.   nystatin 100000 UNIT/ML suspension Commonly known as: MYCOSTATIN   ondansetron 8 MG tablet Commonly known as: ZOFRAN TAKE 1/2 TO 1 TABLET BY MOUTH EVERY 8 HOURS AS NEEDED FOR NAUSEA AND VOMITING   oxyCODONE-acetaminophen 10-325 MG tablet Commonly known as: PERCOCET Take 1 tablet by mouth every 6 (six) hours as needed for pain.   peginterferon alfa-2a 180 MCG/ML injection Commonly known as: PEGASYS Inject 45 mcg into the skin every 7 (seven) days.   penicillin v potassium 500 MG tablet Commonly known as: VEETID Take 500 mg by mouth every 8 (eight) hours.   Restasis 0.05 % ophthalmic emulsion Generic drug: cycloSPORINE PLACE ONE DROP INTO EACH EYE EVERY 12 HOURS   SSD 1 % cream Generic drug: silver sulfADIAZINE APPLY TOPICALLY DAILY   traZODone 100 MG tablet Commonly known as: DESYREL Take 100 mg by mouth at bedtime.   TUBERCULIN SYR 1CC/27GX1/2" 27G X 1/2" 1 ML Misc Use as directed once weekly for peg-interferon injections   venlafaxine XR 75 MG 24 hr capsule Commonly known as: Effexor XR Take 1 capsule (75 mg total) by mouth 3 (three) times daily.       Allergies: No Known Allergies  Past Medical History, Surgical history, Social history, and Family History were reviewed and updated.  Review of Systems: Review of Systems  Constitutional: Positive for diaphoresis and malaise/fatigue.  HENT: Negative.   Eyes: Negative.   Respiratory: Positive for shortness of breath.   Cardiovascular: Negative.   Gastrointestinal: Positive for abdominal pain, heartburn and nausea.  Genitourinary: Negative.   Musculoskeletal: Positive for joint pain and myalgias.  Skin: Negative.   Neurological: Positive for dizziness.  Endo/Heme/Allergies: Negative.   Psychiatric/Behavioral: Negative.      Physical Exam:   weight is 159 lb (72.1 kg). Her oral temperature is 97.1 F (36.2 C) (abnormal). Her blood pressure is 149/91 (abnormal) and her pulse is 94. Her respiration is 18 and oxygen saturation is 98%.   Wt Readings from Last 3 Encounters:  03/17/19 159 lb (72.1 kg)  03/03/19 164 lb (74.4 kg)  02/17/19 166 lb (75.3 kg)    Physical Exam Vitals signs reviewed.  HENT:     Head: Normocephalic and atraumatic.  Eyes:     Pupils: Pupils are equal, round, and reactive to light.  Neck:     Musculoskeletal: Normal range of motion.  Cardiovascular:     Rate and Rhythm: Normal rate and regular rhythm.     Heart sounds: Normal heart sounds.  Pulmonary:     Effort: Pulmonary effort is normal.     Breath sounds: Normal breath sounds.  Abdominal:  General: Bowel sounds are normal.     Palpations: Abdomen is soft.     Comments: Her spleen tip is palpable a couple centimeters below the left costal margin.  Musculoskeletal: Normal range of motion.        General: No tenderness or deformity.  Lymphadenopathy:     Cervical: No cervical adenopathy.  Skin:    General: Skin is warm and dry.     Findings: No erythema or rash.  Neurological:     Mental Status: She is alert and oriented to person, place, and time.  Psychiatric:        Behavior: Behavior normal.        Thought Content: Thought content normal.        Judgment: Judgment normal.      Lab Results  Component Value Date   WBC 5.3 03/17/2019   HGB 11.8 (L) 03/17/2019   HCT 37.5 03/17/2019   MCV 105.6 (H) 03/17/2019   PLT 705 (H) 03/17/2019   Lab Results  Component Value Date   FERRITIN 2,054 (H) 10/21/2018   IRON 90 10/21/2018   TIBC 195 (L) 10/21/2018   UIBC 105 (L) 10/21/2018   IRONPCTSAT 46 10/21/2018   Lab Results  Component Value Date   RETICCTPCT 1.9 10/21/2018   RBC 3.55 (L) 03/17/2019   RETICCTABS 45.8 06/02/2015   No results found for: KPAFRELGTCHN, LAMBDASER, KAPLAMBRATIO No results found for: Kandis Cocking,  IGMSERUM No results found for: Odetta Pink, SPEI   Chemistry      Component Value Date/Time   NA 136 03/17/2019 1058   NA 143 07/16/2017 1057   NA 140 11/29/2016 0936   K 3.6 03/17/2019 1058   K 3.2 (L) 07/16/2017 1057   K 4.2 11/29/2016 0936   CL 98 03/17/2019 1058   CL 104 07/16/2017 1057   CO2 29 03/17/2019 1058   CO2 25 07/16/2017 1057   CO2 27 11/29/2016 0936   BUN 11 03/17/2019 1058   BUN 10 07/16/2017 1057   BUN 18.3 11/29/2016 0936   CREATININE 0.82 03/17/2019 1058   CREATININE 0.8 07/16/2017 1057   CREATININE 0.8 11/29/2016 0936      Component Value Date/Time   CALCIUM 9.0 03/17/2019 1058   CALCIUM 8.9 07/16/2017 1057   CALCIUM 8.9 11/29/2016 0936   ALKPHOS 114 03/17/2019 1058   ALKPHOS 71 07/16/2017 1057   ALKPHOS 77 11/29/2016 0936   AST 24 03/17/2019 1058   AST 19 11/29/2016 0936   ALT 17 03/17/2019 1058   ALT 27 07/16/2017 1057   ALT 10 11/29/2016 0936   BILITOT 0.4 03/17/2019 1058   BILITOT <0.22 11/29/2016 0936      Impression and Plan: Ms. Belen is a very pleasant 68 yo caucasian female with essential thrombocythemia.   Hopefully, the weekly interferon dosing will get her back to where she needs to be with her platelet count.  We will plan to get her back in 4 weeks.  Hopefully, we will see her platelet count responding.  I must send this Oborn clearly has very "active" disease with respect to her bone marrow.    [name], MD 8/17/202012:21 PM

## 2019-03-17 NOTE — Telephone Encounter (Signed)
lmom to inform patient of Sept appts per 8/17 los

## 2019-03-18 LAB — IRON AND TIBC
Iron: 97 ug/dL (ref 41–142)
Saturation Ratios: 56 % (ref 21–57)
TIBC: 174 ug/dL — ABNORMAL LOW (ref 236–444)
UIBC: 76 ug/dL — ABNORMAL LOW (ref 120–384)

## 2019-03-18 LAB — FERRITIN: Ferritin: 2362 ng/mL — ABNORMAL HIGH (ref 11–307)

## 2019-03-20 ENCOUNTER — Telehealth: Payer: Self-pay | Admitting: *Deleted

## 2019-03-20 ENCOUNTER — Ambulatory Visit: Payer: Medicare Other | Admitting: Family Medicine

## 2019-03-20 NOTE — Telephone Encounter (Signed)
Copied from [place] [phone]. Topic: General - Inquiry >> Mar 18, 2019 10:45 AM Scherrie Gerlach wrote: Reason for CRM:  pt states she went to Dr Marin Olp yesterday and  her bp was 149/91.  The dr told her that was not too bad, but she thinks it is.  Wants to speak with Dr Charlett Blake about this, no virtual appts. Wants to ask a few questions.

## 2019-03-21 ENCOUNTER — Other Ambulatory Visit: Payer: Self-pay | Admitting: *Deleted

## 2019-03-21 DIAGNOSIS — G8929 Other chronic pain: Secondary | ICD-10-CM

## 2019-03-21 MED ORDER — CYCLOBENZAPRINE HCL 10 MG PO TABS: ORAL_TABLET | ORAL | 0 refills | Status: DC

## 2019-03-21 NOTE — Telephone Encounter (Signed)
Please advise 

## 2019-03-23 NOTE — Telephone Encounter (Signed)
See how she is feeling and set her up with a visit if her numbers remained elevated at home. That number is not too bad if it was just at the office.

## 2019-03-25 ENCOUNTER — Other Ambulatory Visit: Payer: Self-pay | Admitting: Family Medicine

## 2019-03-25 DIAGNOSIS — R300 Dysuria: Secondary | ICD-10-CM

## 2019-03-25 DIAGNOSIS — I10 Essential (primary) hypertension: Secondary | ICD-10-CM

## 2019-03-25 DIAGNOSIS — R739 Hyperglycemia, unspecified: Secondary | ICD-10-CM

## 2019-03-25 DIAGNOSIS — E559 Vitamin D deficiency, unspecified: Secondary | ICD-10-CM

## 2019-03-26 MED FILL — PEGASYS 180 MCG/ML VIAL: 180 | 28 days supply | Qty: 4 | Fill #6

## 2019-03-26 MED FILL — BD TB SYRINGE 27GX1/2": 27G X 1/2" | 28 days supply | Qty: 4 | Fill #6

## 2019-03-26 MED FILL — BD TB SYRINGE 27GX1/2: 27G X 1/2" | 28 days supply | Qty: 4 | Fill #6

## 2019-03-29 ENCOUNTER — Other Ambulatory Visit: Payer: Self-pay | Admitting: Family Medicine

## 2019-03-29 DIAGNOSIS — I10 Essential (primary) hypertension: Secondary | ICD-10-CM

## 2019-03-29 DIAGNOSIS — R300 Dysuria: Secondary | ICD-10-CM

## 2019-03-29 DIAGNOSIS — R739 Hyperglycemia, unspecified: Secondary | ICD-10-CM

## 2019-03-29 DIAGNOSIS — E559 Vitamin D deficiency, unspecified: Secondary | ICD-10-CM

## 2019-03-31 ENCOUNTER — Telehealth: Payer: Self-pay | Admitting: Family Medicine

## 2019-03-31 NOTE — Telephone Encounter (Signed)
Patient has recurrent trouble with bronchitis and sinusitis. Please confirm her symptoms then we can give diagnosis code to pharmacy

## 2019-03-31 NOTE — Telephone Encounter (Signed)
Please advise 

## 2019-03-31 NOTE — Telephone Encounter (Signed)
Pharmacy called and is needing a diagnosis code for pts z pack. Please advise.

## 2019-04-01 NOTE — Telephone Encounter (Signed)
Left message for patient to call the office back

## 2019-04-02 ENCOUNTER — Other Ambulatory Visit: Payer: Self-pay | Admitting: Family Medicine

## 2019-04-02 NOTE — Telephone Encounter (Signed)
Requesting: xanax Contract:yes UDS:n/a Last OV:03/03/19 Next OV:n/a Last Refill:01/28/19  #[id number] Database:   Please advise

## 2019-04-04 ENCOUNTER — Other Ambulatory Visit: Payer: Self-pay | Admitting: Family Medicine

## 2019-04-04 DIAGNOSIS — G8929 Other chronic pain: Secondary | ICD-10-CM

## 2019-04-04 MED ORDER — OXYCODONE-ACETAMINOPHEN 10-325 MG PO TABS: 1.0000 | ORAL_TABLET | Freq: Four times a day (QID) | ORAL | 0 refills | Status: DC | PRN

## 2019-04-04 NOTE — Telephone Encounter (Signed)
Copied from [place] [phone]. Topic: Quick Communication - Rx Refill/Question >> Apr 04, 2019  2:38 PM Izola Price, [place] A wrote: Medication: oxyCODONE-acetaminophen (PERCOCET) 10-325 MG tablet (Patient would like a callback once medication has been sent to pharmacy.)  Has the patient contacted their pharmacy? {Yes (Agent: If no, request that the patient contact the pharmacy for the refill.) (Agent: If yes, when and what did the pharmacy advise?)Contact PCP  Preferred Pharmacy (with phone number or street name): [facility], [place] [address] [phone] (Phone) [phone] (Fax)    Agent: Please be advised that RX refills may take up to 3 business days. We ask that you follow-up with your pharmacy.

## 2019-04-04 NOTE — Telephone Encounter (Signed)
Called pharmacy and patient was placed on a hold for the pharmacy too long, patient states she is coughing and it is dry

## 2019-04-04 NOTE — Telephone Encounter (Signed)
Requested medication (s) are due for refill today: yes  Requested medication (s) are on the active medication list:yes  Last refill:  03/03/2019  Future visit scheduled:no  Notes to clinic: This refill cannot be delegated  Requested Prescriptions  Pending Prescriptions Disp Refills   oxyCODONE-acetaminophen (PERCOCET) 10-325 MG tablet 120 tablet 0    Sig: Take 1 tablet by mouth every 6 (six) hours as needed for pain.     Not Delegated - Analgesics:  Opioid Agonist Combinations Failed - 04/04/2019  2:47 PM      Failed - This refill cannot be delegated      Passed - Urine Drug Screen completed in last 360 days.      Passed - Valid encounter within last 6 months    Recent Outpatient Visits          1 month ago Breast cancer screening   [facility], MD   1 month ago Essential hypertension   [facility], MD   1 month ago Essential hypertension   [facility], MD   2 months ago Chronic back pain, unspecified back location, unspecified back pain laterality   [facility], MD   4 months ago Chronic back pain, unspecified back location, unspecified back pain laterality   [facility], Bonnita Levan, MD

## 2019-04-06 ENCOUNTER — Other Ambulatory Visit: Payer: Self-pay | Admitting: Hematology & Oncology

## 2019-04-08 ENCOUNTER — Other Ambulatory Visit: Payer: Self-pay | Admitting: Family Medicine

## 2019-04-08 ENCOUNTER — Other Ambulatory Visit: Payer: Self-pay | Admitting: *Deleted

## 2019-04-08 MED ORDER — MAGIC MOUTHWASH: 5.0000 mL | Freq: Three times a day (TID) | ORAL | 3 refills | Status: DC | PRN

## 2019-04-10 ENCOUNTER — Telehealth: Payer: Self-pay | Admitting: General Practice

## 2019-04-10 MED ORDER — MONTELUKAST SODIUM 10 MG PO TABS: 10.0000 mg | ORAL_TABLET | Freq: Every day | ORAL | 2 refills | Status: DC

## 2019-04-10 NOTE — Telephone Encounter (Signed)
Called and advised pt that the only medication that needed to be filled was the singulair. Pt wanted to discuss with PCP about a fall per Dr. Jacinto Reap appt was made for tomorrow at 11 am.

## 2019-04-10 NOTE — Telephone Encounter (Signed)
Copied from [place] [phone]. Topic: General - Other >> Apr 04, 2019  2:40 PM Rainey Pines A wrote: Patient would like a nurse to call her back to let her know which medication she needs to be filled. Patient is confused on which medications to get refills on.

## 2019-04-10 NOTE — Addendum Note (Signed)
Addended by: Davis Gourd on: 04/10/2019 03:46 PM   Modules accepted: Orders

## 2019-04-11 ENCOUNTER — Ambulatory Visit (INDEPENDENT_AMBULATORY_CARE_PROVIDER_SITE_OTHER): Payer: Medicare Other | Admitting: Family Medicine

## 2019-04-11 ENCOUNTER — Other Ambulatory Visit: Payer: Self-pay

## 2019-04-11 ENCOUNTER — Ambulatory Visit: Payer: Medicare Other | Admitting: Family Medicine

## 2019-04-11 DIAGNOSIS — F418 Other specified anxiety disorders: Secondary | ICD-10-CM | POA: Diagnosis not present

## 2019-04-11 DIAGNOSIS — G44309 Post-traumatic headache, unspecified, not intractable: Secondary | ICD-10-CM | POA: Diagnosis not present

## 2019-04-11 DIAGNOSIS — M1990 Unspecified osteoarthritis, unspecified site: Secondary | ICD-10-CM

## 2019-04-11 DIAGNOSIS — I10 Essential (primary) hypertension: Secondary | ICD-10-CM | POA: Diagnosis not present

## 2019-04-11 DIAGNOSIS — W19XXXA Unspecified fall, initial encounter: Secondary | ICD-10-CM

## 2019-04-11 DIAGNOSIS — G4700 Insomnia, unspecified: Secondary | ICD-10-CM | POA: Diagnosis not present

## 2019-04-11 DIAGNOSIS — W19XXXS Unspecified fall, sequela: Secondary | ICD-10-CM

## 2019-04-11 NOTE — Assessment & Plan Note (Signed)
She is using Alprazolam bid and did not take Buspar for long enough to know if it was helpful. She is encouraged to consider switching to Buspar again if any further falls or episodes

## 2019-04-11 NOTE — Assessment & Plan Note (Signed)
Doxepin is not helpful but Trazodone has been more helpful, will continue Trazodone but should not take other sedating meds at the same time

## 2019-04-11 NOTE — Assessment & Plan Note (Addendum)
She tripped while walking on a hill a couple of days ago and she reports being sore all over but no bony tenderness or need for xray. She will notify us if pain worsens. Encouraged to minimize taking sedating medications and try and cut down further.

## 2019-04-11 NOTE — Progress Notes (Signed)
Virtual Visit via Phone Note  I connected with Rachael Johnson on 04/11/19 at 11:00 AM EDT by a phone enabled telemedicine application and verified that I am speaking with the correct person using two identifiers.  Location: Patient: home Provider: home   I discussed the limitations of evaluation and management by telemedicine and the availability of in person appointments. The patient expressed understanding and agreed to proceed. Doran Clay, CMA was able to get the patient set up on visit, phone after being unable to complete the video visit.     Subjective:    Patient ID: Rachael Johnson, female    DOB: 11-Jul-1951, 68 y.o.   MRN: [id number]  No chief complaint on file.   HPI Patient is in today for evaluation after a fall. She tripped while walking on a hill a couple of days ago and she reports being sore all over but no bony tenderness or need for xray. Is able to ambulate and get ADLs done and is improving. Her family is concerned about the fall and an episode of possible loss of consciousness. No incontinence or seizure activity but does note some headaches. No recent febrile illness or hospitalizations. Denies CP/palp/SOB/congestion/fevers/GI or GU c/o. Taking meds as prescribed  Past Medical History:  Diagnosis Date  . Acute renal insufficiency 04/09/2017  . Anemia, iron deficiency 07/08/2012  . Arthritis   . Cataract 12/21/2014  . Depression with anxiety 03/24/2011  . Diverticulitis   . Esophageal reflux 08/17/2013  . Essential thrombocythemia ([place]) 01/24/2011  . Frequent episodic tension-type headache   . Gastroenteritis 12/21/2014  . 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 GERD (gastroesophageal reflux disease)   . H. pylori infection 12/19/2012  . Hyperglycemia 12/17/2015  . Hypertension   . Iron deficiency anemia due to chronic blood loss 04/03/2017  . Osteoarthritis 05/24/2017  . Other and unspecified hyperlipidemia 02/25/2013  . Restless leg syndrome 09/30/2014  . Rhabdomyolysis  02/2017  . Scabies 03/28/2015  . 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 Seizure ([place])    childhood  . Shoulder wound, right, sequela 03/14/2017  . Thyroid disease     Past Surgical History:  Procedure Laterality Date  . ABDOMINAL HYSTERECTOMY     1992  . BREAST CYST ASPIRATION    . BREAST SURGERY  1992   biopsy, benign. Fibrocystic  . UMBILICAL HERNIA REPAIR N/A 09/25/2012   Procedure: HERNIA REPAIR UMBILICAL ADULT;  Surgeon: Harl Bowie, MD;  Location: [facility];  Service: General;  Laterality: N/A;    Family History  Problem Relation Age of Onset  . Arthritis Mother   . Cancer Mother        ovarian  . Hypertension Mother   . Heart disease Mother        pacer  . Heart failure Mother   . Arthritis Father   . Cancer Father        lung  . Hypertension Father   . Cancer Sister        lung  . Cancer Brother        prostate  . Cancer Brother        lung  . Hypertension Son   . COPD Brother   . Heart disease Brother        died from CHF  . Hypertension Brother   . Cancer Brother        colon  . Alcohol abuse Brother   . Cirrhosis Brother   . Seizures Sister   . Stroke Sister   .  Arthritis Brother     Social History   Socioeconomic History  . Marital status: Widowed    Spouse name: Not on file  . Number of children: 2  . Years of education: Not on file  . Highest education level: Not on file  Occupational History  . Occupation: APL IT sales professional: [employer]    Comment: [employer] [place]  . Financial resource strain: Not on file  . Food insecurity    Worry: Not on file    Inability: Not on file  . Transportation needs    Medical: Not on file    Non-medical: Not on file  Tobacco Use  . Smoking status: Former Smoker    Packs/day: 3.50    Years: 20.00    Pack years: 70.00    Types: Cigarettes    Start date: 11/15/1970    Quit date: 07/31/1990    Years since quitting: 28.7  . Smokeless tobacco: Never Used  . Tobacco comment: quit 23  years ago  Substance and Sexual Activity  . Alcohol use: No    Alcohol/week: 0.0 standard drinks  . Drug use: No  . Sexual activity: Never  Lifestyle  . Physical activity    Days per week: Not on file    Minutes per session: Not on file  . Stress: Not on file  Relationships  . Social Herbalist on phone: Not on file    Gets together: Not on file    Attends religious service: Not on file    Active member of club or organization: Not on file    Attends meetings of clubs or organizations: Not on file    Relationship status: Not on file  . Intimate partner violence    Fear of current or ex partner: Not on file    Emotionally abused: Not on file    Physically abused: Not on file    Forced sexual activity: Not on file  Other Topics Concern  . Not on file  Social History Narrative   Regular exercise: no   Caffeine Use: 2-3 weekly    Outpatient Medications Prior to Visit  Medication Sig Dispense Refill  . albuterol (VENTOLIN HFA) 108 (90 Base) MCG/ACT inhaler INHALE 2 PUFFS BY MOUTH EVERY 6 HOURS AS NEEDED FOR WHEEZING OR SHORTNESS OF BREATH 18 g 0  . ALPRAZolam (XANAX) 1 MG tablet TAKE 1/2 TO 1 (ONE-HALF TO ONE) TABLET BY MOUTH THREE TIMES DAILY AS NEEDED FOR ANXIETY 90 tablet 0  . atorvastatin (LIPITOR) 10 MG tablet Take 1 tablet (10 mg total) by mouth daily. 90 tablet 3  . azithromycin (ZITHROMAX) 250 MG tablet TAKE 2 TABLETS BY MOUTH ONCE THEN TAKE 1 ONCE DAILY FOR 4 DAYS 6 tablet 0  . budesonide-formoterol (SYMBICORT) 80-4.5 MCG/ACT inhaler Inhale 2 puffs into the lungs 2 (two) times daily. 10.2 g 5  . cetirizine (ZYRTEC) 10 MG tablet Take 1 tablet (10 mg total) by mouth daily. 90 tablet 1  . chlorpheniramine (CHLOR-TRIMETON) 4 MG tablet Take 2 tablets (8 mg total) by mouth at bedtime as needed for allergies. 30 tablet 1  . Cranberry 300 MG tablet Take 300 mg by mouth 2 (two) times daily.    . cyclobenzaprine (FLEXERIL) 10 MG tablet TAKE 1 TABLET BY MOUTH TWICE DAILY  AS NEEDED FOR MUSCLE SPASM 60 tablet 0  . EQ ALLERGY RELIEF CHILDRENS 12.5 MG/5ML liquid TAKE 5 ML BY MOUTH THREE TIMES DAILY AS NEEDED FOR MOUTH  PAIN 240 mL 0  . estradiol (ESTRACE) 0.5 MG tablet Take 1 tablet (0.5 mg total) by mouth 2 (two) times daily. 180 tablet 1  . EUTHYROX 100 MCG tablet TAKE 1 TABLET BY MOUTH ONCE DAILY BEFORE BREAKFAST 90 tablet 0  . famotidine (PEPCID) 40 MG tablet Take 1 tablet (40 mg total) by mouth daily. 90 tablet 1  . fluconazole (DIFLUCAN) 150 MG tablet Take 1 tablet (150 mg total) by mouth once a week. 2 tablet 1  . fluticasone (FLONASE) 50 MCG/ACT nasal spray Place 2 sprays into both nostrils daily. 48 g 3  . furosemide (LASIX) 20 MG tablet TAKE 1 TABLET BY MOUTH THREE TIMES DAILY AS NEEDED FOR EDEMA OR FLUID 90 tablet 0  . gabapentin (NEURONTIN) 400 MG capsule Take 1 capsule (400 mg total) by mouth daily. 90 capsule 1  . granisetron (SANCUSO) 3.1 MG/24HR Apply to skin starting 24 hours before chemotherapy. Remove after 7 days. 4 each 4  . magic mouthwash SOLN Take 5 mLs by mouth 3 (three) times daily as needed for mouth pain. Components benadryl  525 mg, hydrocortisone 60 mg and nystatin 0.6 mg. 240 ml - Oral 240 mL 3  . meclizine (ANTIVERT) 12.5 MG tablet TAKE 1 TABLET BY MOUTH THREE TIMES DAILY AS NEEDED FOR DIZZINESS 40 tablet 0  . montelukast (SINGULAIR) 10 MG tablet Take 1 tablet (10 mg total) by mouth at bedtime. 90 tablet 2  . Multiple Vitamin (MULTIVITAMIN PO) Take 1 tablet by mouth every morning.     . nystatin (MYCOSTATIN) 100000 UNIT/ML suspension     . ondansetron (ZOFRAN) 8 MG tablet TAKE 1/2 TO 1 TABLET BY MOUTH EVERY 8 HOURS AS NEEDED FOR NAUSEA AND VOMITING 90 tablet 1  . oxyCODONE-acetaminophen (PERCOCET) 10-325 MG tablet Take 1 tablet by mouth every 6 (six) hours as needed for pain. 120 tablet 0  . peginterferon alfa-2a (PEGASYS) 180 MCG/ML injection Inject 45 mcg into the skin every 7 (seven) days.    . penicillin v potassium (VEETID) 500 MG  tablet Take 500 mg by mouth every 8 (eight) hours.    . RESTASIS 0.05 % ophthalmic emulsion PLACE ONE DROP INTO EACH EYE EVERY 12 HOURS 0.4 mL 3  . SSD 1 % cream APPLY TOPICALLY DAILY 50 g 0  . traZODone (DESYREL) 100 MG tablet Take 100 mg by mouth at bedtime.    . TUBERCULIN SYR 1CC/27GX1/2" 27G X 1/2" 1 ML MISC Use as directed once weekly for peg-interferon injections 4 each 11  . venlafaxine XR (EFFEXOR XR) 75 MG 24 hr capsule Take 1 capsule (75 mg total) by mouth 3 (three) times daily. 90 capsule 2  . busPIRone (BUSPAR) 15 MG tablet Take 1 tablet (15 mg total) by mouth 4 (four) times daily as needed. 120 tablet 2  . Doxepin HCl 3 MG TABS Take 1 tablet (3 mg total) by mouth at bedtime. 30 tablet 2   No facility-administered medications prior to visit.     No Known Allergies  Review of Systems  Constitutional: Positive for malaise/fatigue. Negative for fever.  HENT: Negative for congestion.   Eyes: Negative for blurred vision.  Respiratory: Negative for shortness of breath.   Cardiovascular: Negative for chest pain, palpitations and leg swelling.  Gastrointestinal: Negative for abdominal pain, blood in stool and nausea.  Genitourinary: Negative for dysuria and frequency.  Musculoskeletal: Positive for back pain, falls and myalgias.  Skin: Negative for rash.  Neurological: Positive for loss of consciousness and headaches. Negative  for dizziness.  Endo/Heme/Allergies: Negative for environmental allergies.  Psychiatric/Behavioral: Negative for depression. The patient is nervous/anxious.        Objective:    Physical Exam unable to obtain via phone visit  LMP 07/31/1990  Wt Readings from Last 3 Encounters:  03/17/19 159 lb (72.1 kg)  03/03/19 164 lb (74.4 kg)  02/17/19 166 lb (75.3 kg)    Diabetic Foot Exam - Simple   No data filed     Lab Results  Component Value Date   WBC 5.3 03/17/2019   HGB 11.8 (L) 03/17/2019   HCT 37.5 03/17/2019   PLT 705 (H) 03/17/2019    GLUCOSE 105 (H) 03/17/2019   CHOL 236 (H) 05/06/2018   TRIG 322.0 (H) 05/06/2018   HDL 59.00 05/06/2018   LDLDIRECT 95.0 05/06/2018   LDLCALC 82 02/12/2017   ALT 17 03/17/2019   AST 24 03/17/2019   NA 136 03/17/2019   K 3.6 03/17/2019   CL 98 03/17/2019   CREATININE 0.82 03/17/2019   BUN 11 03/17/2019   CO2 29 03/17/2019   TSH 0.134 (L) 02/17/2019   INR 0.94 09/16/2018   HGBA1C 5.0 08/10/2017    Lab Results  Component Value Date   TSH 0.134 (L) 02/17/2019   Lab Results  Component Value Date   WBC 5.3 03/17/2019   HGB 11.8 (L) 03/17/2019   HCT 37.5 03/17/2019   MCV 105.6 (H) 03/17/2019   PLT 705 (H) 03/17/2019   Lab Results  Component Value Date   NA 136 03/17/2019   K 3.6 03/17/2019   CHLORIDE 105 11/29/2016   CO2 29 03/17/2019   GLUCOSE 105 (H) 03/17/2019   BUN 11 03/17/2019   CREATININE 0.82 03/17/2019   BILITOT 0.4 03/17/2019   ALKPHOS 114 03/17/2019   AST 24 03/17/2019   ALT 17 03/17/2019   PROT 7.1 03/17/2019   ALBUMIN 3.7 03/17/2019   CALCIUM 9.0 03/17/2019   ANIONGAP 9 03/17/2019   EGFR 80 (L) 11/29/2016   GFR 62.50 05/06/2018   Lab Results  Component Value Date   CHOL 236 (H) 05/06/2018   Lab Results  Component Value Date   HDL 59.00 05/06/2018   Lab Results  Component Value Date   LDLCALC 82 02/12/2017   Lab Results  Component Value Date   TRIG 322.0 (H) 05/06/2018   Lab Results  Component Value Date   CHOLHDL 4 05/06/2018   Lab Results  Component Value Date   HGBA1C 5.0 08/10/2017       Assessment & Plan:   Problem List Items Addressed This Visit    Depression with anxiety    She is using Alprazolam bid and did not take Buspar for long enough to know if it was helpful. She is encouraged to consider switching to Buspar again if any further falls or episodes       Essential hypertension    Monitor weekly, no changes to meds. Encouraged heart healthy diet such as the DASH diet and exercise as tolerated.        Osteoarthritis    Has used Percocet prn for pain for years and she reports it works well for her pain and is typically taking 1 tab po bid but she is encouraged to try and cut down use if possible      Insomnia    Doxepin is not helpful but Trazodone has been more helpful, will continue Trazodone but should not take other sedating meds at the same time  Fall - Primary    She tripped while walking on a hill a couple of days ago and she reports being sore all over but no bony tenderness or need for xray. She will notify us if pain worsens. Encouraged to minimize taking sedating medications and try and cut down further.       Relevant Orders   CT Head Wo Contrast    Other Visit Diagnoses    Post-traumatic headache, not intractable, unspecified chronicity pattern       Relevant Orders   CT Head Wo Contrast      I have discontinued Rachael Johnson's busPIRone and Doxepin HCl. I am also having her maintain her Multiple Vitamin (MULTIVITAMIN PO), Cranberry, budesonide-formoterol, nystatin, fluticasone, fluconazole, ondansetron, TUBERCULIN SYR 1CC/27GX1/2", peginterferon alfa-2a, granisetron, chlorpheniramine, Restasis, gabapentin, cetirizine, estradiol, famotidine, atorvastatin, penicillin v potassium, venlafaxine XR, traZODone, cyclobenzaprine, furosemide, SSD, albuterol, meclizine, azithromycin, ALPRAZolam, oxyCODONE-acetaminophen, EQ Allergy Relief Childrens, magic mouthwash, Euthyrox, and montelukast.  No orders of the defined types were placed in this encounter.    I discussed the assessment and treatment plan with the patient. The patient was provided an opportunity to ask questions and all were answered. The patient agreed with the plan and demonstrated an understanding of the instructions.   The patient was advised to call back or seek an in-person evaluation if the symptoms worsen or if the condition fails to improve as anticipated.  I provided 25 minutes of non-face-to-face  time during this encounter.   Penni Homans, MD

## 2019-04-11 NOTE — Assessment & Plan Note (Signed)
Monitor weekly, no changes to meds. Encouraged heart healthy diet such as the DASH diet and exercise as tolerated.  

## 2019-04-11 NOTE — Assessment & Plan Note (Signed)
Has used Percocet prn for pain for years and she reports it works well for her pain and is typically taking 1 tab po bid but she is encouraged to try and cut down use if possible

## 2019-04-14 ENCOUNTER — Telehealth: Payer: Self-pay | Admitting: Family Medicine

## 2019-04-14 DIAGNOSIS — Z79899 Other long term (current) drug therapy: Secondary | ICD-10-CM

## 2019-04-14 DIAGNOSIS — G8929 Other chronic pain: Secondary | ICD-10-CM
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
## 2019-04-14 NOTE — Telephone Encounter (Signed)
Please advise 

## 2019-04-15 ENCOUNTER — Ambulatory Visit (HOSPITAL_BASED_OUTPATIENT_CLINIC_OR_DEPARTMENT_OTHER)
Admission: RE | Admit: 2019-04-15 | Discharge: 2019-04-15 | Disposition: A | Discharge status: Home / Self Care | Payer: Medicare Other | Source: Ambulatory Visit | Attending: Family Medicine | Admitting: Family Medicine

## 2019-04-15 ENCOUNTER — Other Ambulatory Visit: Payer: Self-pay

## 2019-04-15 ENCOUNTER — Encounter (HOSPITAL_BASED_OUTPATIENT_CLINIC_OR_DEPARTMENT_OTHER): Payer: Self-pay

## 2019-04-15 ENCOUNTER — Inpatient Hospital Stay: Payer: Medicare Other

## 2019-04-15 ENCOUNTER — Inpatient Hospital Stay: Payer: Medicare Other | Attending: Hematology & Oncology | Admitting: Hematology & Oncology

## 2019-04-15 ENCOUNTER — Encounter: Payer: Self-pay | Admitting: Hematology & Oncology

## 2019-04-15 ENCOUNTER — Other Ambulatory Visit: Payer: Medicare Other

## 2019-04-15 VITALS — BP 134/83 | HR 96 | Temp 97.1°F | Resp 20 | Wt 159.0 lb

## 2019-04-15 DIAGNOSIS — R12 Heartburn: Secondary | ICD-10-CM | POA: Diagnosis not present

## 2019-04-15 DIAGNOSIS — Z7982 Long term (current) use of aspirin: Secondary | ICD-10-CM | POA: Insufficient documentation

## 2019-04-15 DIAGNOSIS — D7581 Myelofibrosis: Secondary | ICD-10-CM | POA: Insufficient documentation

## 2019-04-15 DIAGNOSIS — G44309 Post-traumatic headache, unspecified, not intractable: Secondary | ICD-10-CM | POA: Diagnosis not present

## 2019-04-15 DIAGNOSIS — R5383 Other fatigue: Secondary | ICD-10-CM | POA: Diagnosis not present

## 2019-04-15 DIAGNOSIS — R110 Nausea: Secondary | ICD-10-CM | POA: Diagnosis not present

## 2019-04-15 DIAGNOSIS — R42 Dizziness and giddiness: Secondary | ICD-10-CM | POA: Insufficient documentation

## 2019-04-15 DIAGNOSIS — D500 Iron deficiency anemia secondary to blood loss (chronic): Secondary | ICD-10-CM

## 2019-04-15 DIAGNOSIS — W19XXXS Unspecified fall, sequela: Secondary | ICD-10-CM | POA: Diagnosis not present

## 2019-04-15 DIAGNOSIS — R0602 Shortness of breath: Secondary | ICD-10-CM | POA: Insufficient documentation

## 2019-04-15 DIAGNOSIS — R109 Unspecified abdominal pain: Secondary | ICD-10-CM | POA: Insufficient documentation

## 2019-04-15 DIAGNOSIS — D509 Iron deficiency anemia, unspecified: Secondary | ICD-10-CM | POA: Insufficient documentation

## 2019-04-15 DIAGNOSIS — M2550 Pain in unspecified joint: Secondary | ICD-10-CM | POA: Insufficient documentation

## 2019-04-15 DIAGNOSIS — D75839 Thrombocytosis, unspecified: Secondary | ICD-10-CM

## 2019-04-15 DIAGNOSIS — D473 Essential (hemorrhagic) thrombocythemia: Secondary | ICD-10-CM | POA: Diagnosis not present

## 2019-04-15 DIAGNOSIS — R61 Generalized hyperhidrosis: Secondary | ICD-10-CM | POA: Insufficient documentation

## 2019-04-15 DIAGNOSIS — E032 Hypothyroidism due to medicaments and other exogenous substances: Secondary | ICD-10-CM | POA: Diagnosis not present

## 2019-04-15 DIAGNOSIS — M791 Myalgia, unspecified site: Secondary | ICD-10-CM | POA: Insufficient documentation

## 2019-04-15 DIAGNOSIS — Z1231 Encounter for screening mammogram for malignant neoplasm of breast: Secondary | ICD-10-CM | POA: Insufficient documentation

## 2019-04-15 DIAGNOSIS — Z1239 Encounter for other screening for malignant neoplasm of breast: Secondary | ICD-10-CM

## 2019-04-15 DIAGNOSIS — Z79899 Other long term (current) drug therapy: Secondary | ICD-10-CM | POA: Diagnosis not present

## 2019-04-15 LAB — CBC WITH DIFFERENTIAL (CANCER CENTER ONLY)
Abs Immature Granulocytes: 0.02 10*3/uL (ref 0.00–0.07)
Basophils Absolute: 0 10*3/uL (ref 0.0–0.1)
Basophils Relative: 1 %
Eosinophils Absolute: 0.3 10*3/uL (ref 0.0–0.5)
Eosinophils Relative: 6 %
HCT: 33.6 % — ABNORMAL LOW (ref 36.0–46.0)
Hemoglobin: 10.8 g/dL — ABNORMAL LOW (ref 12.0–15.0)
Immature Granulocytes: 1 %
Lymphocytes Relative: 25 %
Lymphs Abs: 1 10*3/uL (ref 0.7–4.0)
MCH: 33.4 pg (ref 26.0–34.0)
MCHC: 32.1 g/dL (ref 30.0–36.0)
MCV: 104 fL — ABNORMAL HIGH (ref 80.0–100.0)
Monocytes Absolute: 0.4 10*3/uL (ref 0.1–1.0)
Monocytes Relative: 11 %
Neutro Abs: 2.3 10*3/uL (ref 1.7–7.7)
Neutrophils Relative %: 56 %
Platelet Count: 432 10*3/uL — ABNORMAL HIGH (ref 150–400)
RBC: 3.23 MIL/uL — ABNORMAL LOW (ref 3.87–5.11)
RDW: 14.5 % (ref 11.5–15.5)
WBC Count: 4 10*3/uL (ref 4.0–10.5)
nRBC: 0 % (ref 0.0–0.2)

## 2019-04-15 LAB — CMP (CANCER CENTER ONLY)
ALT: 14 U/L (ref 0–44)
AST: 26 U/L (ref 15–41)
Albumin: 3.9 g/dL (ref 3.5–5.0)
Alkaline Phosphatase: 104 U/L (ref 38–126)
Anion gap: 8 (ref 5–15)
BUN: 9 mg/dL (ref 8–23)
CO2: 27 mmol/L (ref 22–32)
Calcium: 8.8 mg/dL — ABNORMAL LOW (ref 8.9–10.3)
Chloride: 102 mmol/L (ref 98–111)
Creatinine: 0.85 mg/dL (ref 0.44–1.00)
GFR, Est AFR Am: >60 mL/min (ref >60)
GFR, Estimated: >60 mL/min (ref >60)
Glucose, Bld: 100 mg/dL — ABNORMAL HIGH (ref 70–99)
Potassium: 3.1 mmol/L — ABNORMAL LOW (ref 3.5–5.1)
Sodium: 137 mmol/L (ref 135–145)
Total Bilirubin: 0.4 mg/dL (ref 0.3–1.2)
Total Protein: 7.3 g/dL (ref 6.5–8.1)

## 2019-04-15 LAB — SAVE SMEAR(SSMR), FOR PROVIDER SLIDE REVIEW

## 2019-04-15 MED ORDER — OXYCODONE-ACETAMINOPHEN 10-325 MG PO TABS: 1.0000 | ORAL_TABLET | Freq: Four times a day (QID) | ORAL | 0 refills | Status: AC | PRN

## 2019-04-15 NOTE — Telephone Encounter (Signed)
Pt dropping off plice report for provider to see.(COPY) Please send meds to  [place], [place]. Please advise. Pt stated does not have any meds at all. Document given to CMA.
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
## 2019-04-15 NOTE — Addendum Note (Signed)
Addended by: Magdalene Molly A on: 04/15/2019 02:25 PM   Modules accepted: Orders

## 2019-04-16 LAB — FERRITIN: Ferritin: 2916 ng/mL — ABNORMAL HIGH (ref 11–307)

## 2019-04-16 LAB — LACTATE DEHYDROGENASE: LDH: 277 U/L — ABNORMAL HIGH (ref 98–192)

## 2019-04-16 LAB — IRON AND TIBC
Iron: 70 ug/dL (ref 41–142)
Saturation Ratios: 39 % (ref 21–57)
TIBC: 178 ug/dL — ABNORMAL LOW (ref 236–444)
UIBC: 108 ug/dL — ABNORMAL LOW (ref 120–384)

## 2019-04-16 NOTE — Telephone Encounter (Signed)
Patient notified

## 2019-04-19 LAB — PAIN MGMT, PROFILE 8 W/CONF, U
6 Acetylmorphine: NEGATIVE ng/mL
Alcohol Metabolites: NEGATIVE ng/mL (ref <500)
Alphahydroxyalprazolam: 3050 ng/mL
Alphahydroxymidazolam: NEGATIVE ng/mL
Alphahydroxytriazolam: NEGATIVE ng/mL
Aminoclonazepam: NEGATIVE ng/mL
Amphetamines: NEGATIVE ng/mL
Benzodiazepines: POSITIVE ng/mL
Buprenorphine, Urine: NEGATIVE ng/mL
Cocaine Metabolite: NEGATIVE ng/mL
Codeine: NEGATIVE ng/mL
Creatinine: 119.6 mg/dL
Hydrocodone: NEGATIVE ng/mL
Hydromorphone: NEGATIVE ng/mL
Hydroxyethylflurazepam: NEGATIVE ng/mL
Lorazepam: NEGATIVE ng/mL
MDMA: NEGATIVE ng/mL
Marijuana Metabolite: NEGATIVE ng/mL
Morphine: NEGATIVE ng/mL
Nordiazepam: NEGATIVE ng/mL
Norhydrocodone: NEGATIVE ng/mL
Noroxycodone: 5703 ng/mL
Opiates: NEGATIVE ng/mL
Oxazepam: NEGATIVE ng/mL
Oxidant: NEGATIVE ug/mL
Oxycodone: 3393 ng/mL
Oxycodone: POSITIVE ng/mL
Oxymorphone: 8091 ng/mL
Temazepam: NEGATIVE ng/mL
pH: 5.9 (ref 4.5–9.0)

## 2019-04-21 ENCOUNTER — Other Ambulatory Visit: Payer: Self-pay

## 2019-04-21 ENCOUNTER — Ambulatory Visit (INDEPENDENT_AMBULATORY_CARE_PROVIDER_SITE_OTHER): Payer: Medicare Other | Admitting: Family Medicine

## 2019-04-21 ENCOUNTER — Other Ambulatory Visit: Payer: Self-pay | Admitting: Hematology & Oncology

## 2019-04-21 ENCOUNTER — Telehealth: Payer: Self-pay | Admitting: *Deleted

## 2019-04-21 DIAGNOSIS — J029 Acute pharyngitis, unspecified: Secondary | ICD-10-CM | POA: Diagnosis not present

## 2019-04-21 DIAGNOSIS — I10 Essential (primary) hypertension: Secondary | ICD-10-CM

## 2019-04-21 DIAGNOSIS — R739 Hyperglycemia, unspecified: Secondary | ICD-10-CM

## 2019-04-21 DIAGNOSIS — D473 Essential (hemorrhagic) thrombocythemia: Secondary | ICD-10-CM

## 2019-04-21 DIAGNOSIS — J45991 Cough variant asthma: Secondary | ICD-10-CM

## 2019-04-21 MED ORDER — FLUCONAZOLE 150 MG PO TABS: 150.0000 mg | ORAL_TABLET | ORAL | 1 refills | Status: DC

## 2019-04-21 MED ORDER — AMOXICILLIN 500 MG PO CAPS: 500.0000 mg | ORAL_CAPSULE | Freq: Three times a day (TID) | ORAL | 0 refills | Status: DC

## 2019-04-21 NOTE — Assessment & Plan Note (Signed)
hgba1c acceptable, minimize simple carbs. Increase exercise as tolerated.  

## 2019-04-21 NOTE — Assessment & Plan Note (Addendum)
Sore throat and cough with malaise and fatigue are noted. She reports this is similar to a syndrome she suffers with roughly annually. She is started on Amoxicillin and she is asked to go for COVID testing but she is noncommital we will check in on her in the morning and encouraged testing.

## 2019-04-21 NOTE — Assessment & Plan Note (Signed)
Despite cough no c/o wheezing or other worsening respiratory symptoms

## 2019-04-21 NOTE — Progress Notes (Signed)
Virtual Visit via phone Note  I connected with Rachael Johnson on 04/21/19 at  3:00 PM EDT by a phone enabled telemedicine application and verified that I am speaking with the correct person using two identifiers.  Location: Patient: home Provider: office   I discussed the limitations of evaluation and management by telemedicine and the availability of in person appointments. The patient expressed understanding and agreed to proceed. Marin Roberts, CMA was able to get the patient set up on a phone visit after being unable to set up a video visit   Subjective:    Patient ID: Rachael Johnson, female    DOB: [birth year], 68 y.o.   MRN: [id number]  Chief Complaint  Patient presents with   Sore Throat    complains of sore throat for about 2 days     HPI Patient is in today for evaluation of sore throat, cough and more. She began feeling badly ove rthe weekend with painful throat that was bad enough to keep her from eating well. She has been able to drink fluids well. She notes increased congestion and a dry cough. She also notes fatigue and myalgias. She reports it is similar to her annual illness she tends to get. She has been quarantining well and denies any concerning exposure. Denies CP/palp/SOB/HA/fevers/GI or GU c/o. 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 pylori infection 12/19/2012   Hyperglycemia 12/17/2015   Hypertension    Iron deficiency anemia due to chronic blood loss 04/03/2017   Osteoarthritis 05/24/2017   Other and unspecified hyperlipidemia 02/25/2013     Restless leg syndrome 09/30/2014   Rhabdomyolysis 02/2017   Scabies 03/28/2015   Secondary myelofibrosis ([place]) 11/29/2017   Seizure ([place])    childhood   Shoulder wound, right, sequela 03/14/2017   Thyroid disease     Past Surgical History:  Procedure Laterality Date   ABDOMINAL HYSTERECTOMY     1992   BREAST BIOPSY     BREAST CYST ASPIRATION     BREAST SURGERY  1992   biopsy, benign. Fibrocystic   UMBILICAL HERNIA REPAIR N/A 09/25/2012   Procedure: HERNIA REPAIR UMBILICAL ADULT;  Surgeon: Harl Bowie, MD;  Location: [facility];  Service: General;  Laterality: N/A;    Family History  Problem Relation Age of Onset   Arthritis Mother    Cancer Mother        ovarian   Hypertension Mother    Heart disease Mother        pacer   Heart failure Mother    Arthritis Father    Cancer Father        lung   Hypertension Father    Cancer Sister        lung   Cancer Brother        prostate   Cancer Brother        lung   Hypertension Son    COPD Brother    Heart disease Brother        died from CHF   Hypertension Brother    Cancer Brother  colon   Alcohol abuse Brother    Cirrhosis Brother    Seizures Sister    Stroke Sister    Arthritis Brother     Social History   Socioeconomic History   Marital status: Widowed    Spouse name: Not on file   Number of children: 2   Years of education: Not on file   Highest education level: Not on file  Occupational History   Occupation: APL Mining engineer    Employer: [employer]    Comment: Pensions consultant strain: Not on file   Food insecurity    Worry: Not on file    Inability: Not on file   Transportation needs    Medical: Not on file    Non-medical: Not on file  Tobacco Use   Smoking status: Former Smoker    Packs/day: 3.50    Years: 20.00    Pack years: 70.00    Types: Cigarettes    Start date: 11/15/1970    Quit date: 07/31/1990    Years since  quitting: 28.7   Smokeless tobacco: Never Used   Tobacco comment: quit 23 years ago  Substance and Sexual Activity   Alcohol use: No    Alcohol/week: 0.0 standard drinks   Drug use: No   Sexual activity: Never  Lifestyle   Physical activity    Days per week: Not on file    Minutes per session: Not on file   Stress: Not on file  Relationships   Social connections    Talks on phone: Not on file    Gets together: Not on file    Attends religious service: Not on file    Active member of club or organization: Not on file    Attends meetings of clubs or organizations: Not on file    Relationship status: Not on file   Intimate partner violence    Fear of current or ex partner: Not on file    Emotionally abused: Not on file    Physically abused: Not on file    Forced sexual activity: Not on file  Other Topics Concern   Not on file  Social History Narrative   Regular exercise: no   Caffeine Use: 2-3 weekly    Outpatient Medications Prior to Visit  Medication Sig Dispense Refill   albuterol (VENTOLIN HFA) 108 (90 Base) MCG/ACT inhaler INHALE 2 PUFFS BY MOUTH EVERY 6 HOURS AS NEEDED FOR WHEEZING OR SHORTNESS OF BREATH 18 g 0   ALPRAZolam (XANAX) 1 MG tablet TAKE 1/2 TO 1 (ONE-HALF TO ONE) TABLET BY MOUTH THREE TIMES DAILY AS NEEDED FOR ANXIETY 90 tablet 0   atorvastatin (LIPITOR) 10 MG tablet Take 1 tablet (10 mg total) by mouth daily. 90 tablet 3   budesonide-formoterol (SYMBICORT) 80-4.5 MCG/ACT inhaler Inhale 2 puffs into the lungs 2 (two) times daily. 10.2 g 5   cetirizine (ZYRTEC) 10 MG tablet Take 1 tablet (10 mg total) by mouth daily. 90 tablet 1   chlorpheniramine (CHLOR-TRIMETON) 4 MG tablet Take 2 tablets (8 mg total) by mouth at bedtime as needed for allergies. 30 tablet 1   Cranberry 300 MG tablet Take 300 mg by mouth 2 (two) times daily.     cyclobenzaprine (FLEXERIL) 10 MG tablet TAKE 1 TABLET BY MOUTH TWICE DAILY AS NEEDED FOR MUSCLE SPASM 60 tablet 0     EQ ALLERGY RELIEF CHILDRENS 12.5 MG/5ML liquid TAKE 5 ML BY MOUTH THREE TIMES DAILY AS NEEDED FOR  MOUTH  PAIN 240 mL 0   estradiol (ESTRACE) 0.5 MG tablet Take 1 tablet (0.5 mg total) by mouth 2 (two) times daily. 180 tablet 1   EUTHYROX 100 MCG tablet TAKE 1 TABLET BY MOUTH ONCE DAILY BEFORE BREAKFAST 90 tablet 0   famotidine (PEPCID) 40 MG tablet Take 1 tablet (40 mg total) by mouth daily. 90 tablet 1   fluticasone (FLONASE) 50 MCG/ACT nasal spray Place 2 sprays into both nostrils daily. 48 g 3   furosemide (LASIX) 20 MG tablet TAKE 1 TABLET BY MOUTH THREE TIMES DAILY AS NEEDED FOR EDEMA OR FLUID 90 tablet 0   gabapentin (NEURONTIN) 400 MG capsule Take 1 capsule (400 mg total) by mouth daily. 90 capsule 1   granisetron (SANCUSO) 3.1 MG/24HR Apply to skin starting 24 hours before chemotherapy. Remove after 7 days. 4 each 4   magic mouthwash SOLN Take 5 mLs by mouth 3 (three) times daily as needed for mouth pain. Components benadryl  525 mg, hydrocortisone 60 mg and nystatin 0.6 mg. 240 ml - Oral 240 mL 3   meclizine (ANTIVERT) 12.5 MG tablet TAKE 1 TABLET BY MOUTH THREE TIMES DAILY AS NEEDED FOR DIZZINESS 40 tablet 0   montelukast (SINGULAIR) 10 MG tablet Take 1 tablet (10 mg total) by mouth at bedtime. 90 tablet 2   Multiple Vitamin (MULTIVITAMIN PO) Take 1 tablet by mouth every morning.      nystatin (MYCOSTATIN) 100000 UNIT/ML suspension      ondansetron (ZOFRAN) 8 MG tablet TAKE 1/2 TO 1 TABLET BY MOUTH EVERY 8 HOURS AS NEEDED FOR NAUSEA AND VOMITING 90 tablet 1   oxyCODONE-acetaminophen (PERCOCET) 10-325 MG tablet Take 1 tablet by mouth every 6 (six) hours as needed for up to 14 days for pain. 56 tablet 0   PEGASYS 180 MCG/ML injection INJECT 0.25 MLS (45 MCG TOTAL) INTO THE SKIN EVERY 7 (SEVEN) DAYS. SINGLE USE VIALS, AFTER INJECTION DISPOSE OF REMAINING VIAL CONTENT. 4 mL 6   penicillin v potassium (VEETID) 500 MG tablet Take 500 mg by mouth every 8 (eight) hours.      RESTASIS 0.05 % ophthalmic emulsion PLACE ONE DROP INTO EACH EYE EVERY 12 HOURS 0.4 mL 3   SSD 1 % cream APPLY TOPICALLY DAILY 50 g 0   traZODone (DESYREL) 100 MG tablet Take 100 mg by mouth at bedtime.     TUBERCULIN SYR 1CC/27GX1/2" 27G X 1/2" 1 ML MISC Use as directed once weekly for peg-interferon injections 4 each 11   venlafaxine XR (EFFEXOR XR) 75 MG 24 hr capsule Take 1 capsule (75 mg total) by mouth 3 (three) times daily. 90 capsule 2   azithromycin (ZITHROMAX) 250 MG tablet TAKE 2 TABLETS BY MOUTH ONCE THEN TAKE 1 ONCE DAILY FOR 4 DAYS 6 tablet 0   fluconazole (DIFLUCAN) 150 MG tablet Take 1 tablet (150 mg total) by mouth once a week. 2 tablet 1   No facility-administered medications prior to visit.     No Known Allergies  Review of Systems  Constitutional: Positive for malaise/fatigue. Negative for fever.  HENT: Positive for congestion and sore throat.   Eyes: Negative for blurred vision.  Respiratory: Positive for cough. Negative for shortness of breath.   Cardiovascular: Negative for chest pain, palpitations and leg swelling.  Gastrointestinal: Negative for abdominal pain, blood in stool and nausea.  Genitourinary: Negative for dysuria and frequency.  Musculoskeletal: Positive for myalgias. Negative for falls.  Skin: Negative for rash.  Neurological: Negative for dizziness, loss  of consciousness and headaches.  Endo/Heme/Allergies: Negative for environmental allergies.  Psychiatric/Behavioral: Negative for depression. The patient is not nervous/anxious.        Objective:    Physical Exam unable to perform via phone visit.  Temp (!) 97.2 F (36.2 C) (Oral)    Ht 5' 3.5" (1.613 m)    Wt 156 lb (70.8 kg)    LMP 07/31/1990    BMI 27.20 kg/m  Wt Readings from Last 3 Encounters:  04/21/19 156 lb (70.8 kg)  04/15/19 159 lb (72.1 kg)  03/17/19 159 lb (72.1 kg)    Diabetic Foot Exam - Simple   No data filed     Lab Results  Component Value Date   WBC 4.0  04/15/2019   HGB 10.8 (L) 04/15/2019   HCT 33.6 (L) 04/15/2019   PLT 432 (H) 04/15/2019   GLUCOSE 100 (H) 04/15/2019   CHOL 236 (H) 05/06/2018   TRIG 322.0 (H) 05/06/2018   HDL 59.00 05/06/2018   LDLDIRECT 95.0 05/06/2018   LDLCALC 82 02/12/2017   ALT 14 04/15/2019   AST 26 04/15/2019   NA 137 04/15/2019   K 3.1 (L) 04/15/2019   CL 102 04/15/2019   CREATININE 0.85 04/15/2019   BUN 9 04/15/2019   CO2 27 04/15/2019   TSH 0.134 (L) 02/17/2019   INR 0.94 09/16/2018   HGBA1C 5.0 08/10/2017    Lab Results  Component Value Date   TSH 0.134 (L) 02/17/2019   Lab Results  Component Value Date   WBC 4.0 04/15/2019   HGB 10.8 (L) 04/15/2019   HCT 33.6 (L) 04/15/2019   MCV 104.0 (H) 04/15/2019   PLT 432 (H) 04/15/2019   Lab Results  Component Value Date   NA 137 04/15/2019   K 3.1 (L) 04/15/2019   CHLORIDE 105 11/29/2016   CO2 27 04/15/2019   GLUCOSE 100 (H) 04/15/2019   BUN 9 04/15/2019   CREATININE 0.85 04/15/2019   BILITOT 0.4 04/15/2019   ALKPHOS 104 04/15/2019   AST 26 04/15/2019   ALT 14 04/15/2019   PROT 7.3 04/15/2019   ALBUMIN 3.9 04/15/2019   CALCIUM 8.8 (L) 04/15/2019   ANIONGAP 8 04/15/2019   EGFR 80 (L) 11/29/2016   GFR 62.50 05/06/2018   Lab Results  Component Value Date   CHOL 236 (H) 05/06/2018   Lab Results  Component Value Date   HDL 59.00 05/06/2018   Lab Results  Component Value Date   LDLCALC 82 02/12/2017   Lab Results  Component Value Date   TRIG 322.0 (H) 05/06/2018   Lab Results  Component Value Date   CHOLHDL 4 05/06/2018   Lab Results  Component Value Date   HGBA1C 5.0 08/10/2017       Assessment & Plan:   Problem List Items Addressed This Visit    Pharyngitis    Sore throat and cough with malaise and fatigue are noted. She reports this is similar to a syndrome she suffers with roughly annually. She is started on Amoxicillin and she is asked to go for COVID testing but she is noncommital we will check in on her in the  morning and encouraged testing.       Hyperglycemia    hgba1c acceptable, minimize simple carbs. Increase exercise as tolerated.      Essential hypertension    Monitor vitals weekly, no changes to meds. Encouraged heart healthy diet such as the DASH diet and exercise as tolerated.       Cough variant  asthma vs UACS    Despite cough no c/o wheezing or other worsening respiratory symptoms         I have discontinued Taria D. Catanzaro's azithromycin. I am also having her start on amoxicillin. Additionally, I am having her maintain her Multiple Vitamin (MULTIVITAMIN PO), Cranberry, budesonide-formoterol, nystatin, fluticasone, ondansetron, TUBERCULIN SYR 1CC/27GX1/2", granisetron, chlorpheniramine, Restasis, gabapentin, cetirizine, estradiol, famotidine, atorvastatin, penicillin v potassium, venlafaxine XR, traZODone, cyclobenzaprine, furosemide, SSD, albuterol, meclizine, ALPRAZolam, EQ Allergy Relief Childrens, magic mouthwash, Euthyrox, montelukast, oxyCODONE-acetaminophen, Pegasys, and fluconazole.  Meds ordered this encounter  Medications   amoxicillin (AMOXIL) 500 MG capsule    Sig: Take 1 capsule (500 mg total) by mouth 3 (three) times daily.    Dispense:  30 capsule    Refill:  0   fluconazole (DIFLUCAN) 150 MG tablet    Sig: Take 1 tablet (150 mg total) by mouth once a week.    Dispense:  2 tablet    Refill:  1     I discussed the assessment and treatment plan with the patient. The patient was provided an opportunity to ask questions and all were answered. The patient agreed with the plan and demonstrated an understanding of the instructions.   The patient was advised to call back or seek an in-person evaluation if the symptoms worsen or if the condition fails to improve as anticipated.  I provided 25 minutes of non-face-to-face time during this encounter.   Penni Homans, MD

## 2019-04-21 NOTE — Telephone Encounter (Signed)
Left detailed message machine that I did not see no show on chart and to call to make a virtual appointment to be seen in regards to her sore throat and that if she cannot get in with Dr. Charlett Blake that one of the other providers would be happy to see her.
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
## 2019-04-21 NOTE — Assessment & Plan Note (Signed)
Monitor vitals weekly, no changes to meds. Encouraged heart healthy diet such as the DASH diet and exercise as tolerated.

## 2019-04-22 ENCOUNTER — Telehealth: Payer: Self-pay | Admitting: *Deleted

## 2019-04-22 ENCOUNTER — Other Ambulatory Visit: Payer: Self-pay | Admitting: Family Medicine

## 2019-04-22 DIAGNOSIS — R739 Hyperglycemia, unspecified: Secondary | ICD-10-CM

## 2019-04-22 DIAGNOSIS — E559 Vitamin D deficiency, unspecified: Secondary | ICD-10-CM

## 2019-04-22 DIAGNOSIS — R300 Dysuria: Secondary | ICD-10-CM

## 2019-04-22 DIAGNOSIS — I10 Essential (primary) hypertension: Secondary | ICD-10-CM

## 2019-04-22 NOTE — Telephone Encounter (Signed)
Notified pt of normal CT and Mammogram. Pt states she is still having syncopal episodes. "Passed out the other day while I was hugging my niece".  Pt wants to know what she should do next and when does she need to follow up with PCP.   Copied from [place] [id number]. Topic: Quick Communication - Lab Results (Clinic Use ONLY) >> Apr 22, 2019  9:14 AM Pauline Good wrote: Pt want call back to get her results from her scan and mammogram

## 2019-04-22 NOTE — Telephone Encounter (Signed)
She needs COVID testing and evaluation. If she is worsening she needs to consider going to ER. If stable, do COVID testing today and then a visit in the office next week

## 2019-04-22 NOTE — Telephone Encounter (Signed)
Per Dr. Charlett Blake she would need to follow up with her after she gets tested for COVID and we can either do a virtual visit or go to the ER. Called patient she voiced her understanding

## 2019-04-23 ENCOUNTER — Telehealth: Payer: Self-pay | Admitting: Family Medicine

## 2019-04-23 MED FILL — BD TB SYRINGE 27GX1/2: 27G X 1/2" | 28 days supply | Qty: 4 | Fill #7

## 2019-04-23 MED FILL — PEGASYS 180 MCG/ML VIAL: 180 | 28 days supply | Qty: 4 | Fill #0

## 2019-04-23 MED FILL — BD TB SYRINGE 27GX1/2": 27G X 1/2" | 28 days supply | Qty: 4 | Fill #7

## 2019-04-23 NOTE — Telephone Encounter (Signed)
Patient called and would like a call back from [facility] regarding visit. Please call patient back, thanks.

## 2019-04-24 NOTE — Telephone Encounter (Signed)
Spoke with patient she states that she does not feel like she needs to go get tested. She states if she changes her mind she will call and let us know

## 2019-04-25 NOTE — Telephone Encounter (Signed)
Called patient she was upset about how ling it was taking for Dr. Charlett Blake to get to call her. I did let her know that pmd was running behind and would get to her soon

## 2019-04-27 ENCOUNTER — Other Ambulatory Visit: Payer: Self-pay | Admitting: Hematology & Oncology

## 2019-04-28 ENCOUNTER — Other Ambulatory Visit: Payer: Self-pay | Admitting: Family Medicine

## 2019-04-28 DIAGNOSIS — R739 Hyperglycemia, unspecified: Secondary | ICD-10-CM

## 2019-04-28 DIAGNOSIS — R300 Dysuria: Secondary | ICD-10-CM

## 2019-04-28 DIAGNOSIS — I10 Essential (primary) hypertension: Secondary | ICD-10-CM | POA: Diagnosis not present

## 2019-04-28 DIAGNOSIS — D473 Essential (hemorrhagic) thrombocythemia: Secondary | ICD-10-CM | POA: Diagnosis not present

## 2019-04-28 DIAGNOSIS — E559 Vitamin D deficiency, unspecified: Secondary | ICD-10-CM

## 2019-04-29 ENCOUNTER — Other Ambulatory Visit: Payer: Self-pay | Admitting: Family Medicine

## 2019-04-29 DIAGNOSIS — G8929 Other chronic pain: Secondary | ICD-10-CM

## 2019-04-29 NOTE — Telephone Encounter (Signed)
Pt called in requesting a refill on any/all medications that is due a refill in the next couple of days.  Pt says that her pain medication can be filled on 10/20.   Pharmacy:  [facility] [address] [place] [address] [phone] (Phone) [phone] (Fax)   Pt would also like to have her last AVS mailed out to her at the address on file.    Please advise.

## 2019-04-29 NOTE — Telephone Encounter (Signed)
Requesting:xanax Contract:yes [id number] risk next screen 3 to 6 months  Last OV:911/20 Next OV: 08/14/19 Last Refill:04/02/19  #[id number] Database:   Please advise

## 2019-05-01 ENCOUNTER — Other Ambulatory Visit: Payer: Self-pay | Admitting: Family Medicine

## 2019-05-01 DIAGNOSIS — M549 Dorsalgia, unspecified: Secondary | ICD-10-CM

## 2019-05-01 DIAGNOSIS — G8929 Other chronic pain: Secondary | ICD-10-CM

## 2019-05-01 NOTE — Telephone Encounter (Signed)
Medication Refill - Medication:  ALPRAZolam (XANAX) 1 MG tablet Percocet cyclobenzaprine (FLEXERIL) 10 MG tablet  Has the patient contacted their pharmacy? Yes - states they haven't received this (Agent: If no, request that the patient contact the pharmacy for the refill.) (Agent: If yes, when and what did the pharmacy advise?)  Preferred Pharmacy (with phone number or street name):  [facility], [place] [phone] (Phone) [phone] (Fax)     Agent: Please be advised that RX refills may take up to 3 business days. We ask that you follow-up with your pharmacy.

## 2019-05-01 NOTE — Telephone Encounter (Signed)
Requested medication (s) are due for refill today: yes  Requested medication (s) are on the active medication list: yes  Last refill:  04/29/2019  Future visit scheduled: yes  Notes to clinic:  Refill cannot be delegated  Patient states pharmacy didn't receive refill   Requested Prescriptions  Pending Prescriptions Disp Refills   ALPRAZolam (XANAX) 1 MG tablet 90 tablet 0     Not Delegated - Psychiatry:  Anxiolytics/Hypnotics Failed - 05/01/2019 10:59 AM      Failed - This refill cannot be delegated      Passed - Urine Drug Screen completed in last 360 days.      Passed - Valid encounter within last 6 months    Recent Outpatient Visits          1 week ago Pharyngitis, unspecified etiology   [facility] [place], [place]   2 weeks ago Fall, sequela   [facility] [place], MD   1 month ago Breast cancer screening   [facility] [place], MD   2 months ago Essential hypertension   [facility] [place], MD   2 months ago Essential hypertension   [facility] [place], MD      Future Appointments            In 3 months Mosie Lukes, MD [place] [facility]            cyclobenzaprine (FLEXERIL) 10 MG tablet 60 tablet 0    Sig: TAKE 1 TABLET BY MOUTH TWICE DAILY AS NEEDED FOR MUSCLE SPASM     Not Delegated - Analgesics:  Muscle Relaxants Failed - 05/01/2019 10:59 AM      Failed - This refill cannot be delegated      Passed - Valid encounter within last 6 months    Recent Outpatient Visits          1 week ago Pharyngitis, unspecified etiology   [facility] [place], MD   2 weeks ago Fall, sequela   [facility] [place], MD   1 month ago Breast cancer screening   [facility] [place], MD   2 months ago Essential hypertension   [facility] [place], MD   2 months ago Essential hypertension   [facility] [place], MD      Future Appointments            In 3 months Mosie Lukes, MD [place] [facility]

## 2019-05-02 ENCOUNTER — Other Ambulatory Visit: Payer: Self-pay | Admitting: Family Medicine

## 2019-05-02 MED ORDER — CYCLOBENZAPRINE HCL 10 MG PO TABS: ORAL_TABLET | ORAL | 0 refills | Status: DC

## 2019-05-02 NOTE — Telephone Encounter (Signed)
Pt following up on refill request for  oxyCODONE-acetaminophen (PERCOCET) 10-325 MG tablet   This can be refilled 05/04/19.(not 10/20 as previous message) Also needs: cyclobenzaprine (FLEXERIL) 10 MG tablet  [facility] [address], [place] [address] [phone] (Phone) [phone] (Fax)

## 2019-05-02 NOTE — Telephone Encounter (Signed)
Looks like she got Oxycodone on 9/14 #120, that should last her a month. Also she had her Flexeril refilled very recently so I also do not need that now? What is she saying she needs now?

## 2019-05-02 NOTE — Telephone Encounter (Signed)
Prescription not currently on medication list. Please advise.

## 2019-05-05 ENCOUNTER — Encounter: Payer: Self-pay | Admitting: Family Medicine

## 2019-05-05 ENCOUNTER — Encounter: Payer: Self-pay | Admitting: *Deleted

## 2019-05-05 ENCOUNTER — Other Ambulatory Visit: Payer: Self-pay | Admitting: Family Medicine

## 2019-05-05 ENCOUNTER — Other Ambulatory Visit: Payer: Self-pay | Admitting: *Deleted

## 2019-05-05 ENCOUNTER — Other Ambulatory Visit: Payer: Self-pay

## 2019-05-05 DIAGNOSIS — G8929 Other chronic pain: Secondary | ICD-10-CM

## 2019-05-05 DIAGNOSIS — M549 Dorsalgia, unspecified: Secondary | ICD-10-CM

## 2019-05-05 MED ORDER — OXYCODONE-ACETAMINOPHEN 10-325 MG PO TABS: 1.0000 | ORAL_TABLET | Freq: Four times a day (QID) | ORAL | 0 refills | Status: DC | PRN

## 2019-05-05 MED ORDER — CEFDINIR 300 MG PO CAPS: 300.0000 mg | ORAL_CAPSULE | Freq: Two times a day (BID) | ORAL | 0 refills | Status: DC

## 2019-05-05 NOTE — Telephone Encounter (Signed)
1.  Patient needs refill on Percocet 10/325mg  (med cancel out so not on med list).  Medication is pended for you. She has been out since Friday.  Last written: 04/15/19 for 56 tabs.   Last ov: 04/21/19 Next ov: 08/14/19 Contract: 04/15/19 UDS: 04/15/19  2. Patient also stated that she is still having chest congestion and dry cough.  She feels that the antibiotic, amoxicillin, did not work.  She had a visit on 04/21/19.  She has been taking ibuprofen and tussin.  She stated that she did not ever get tested for covid  Because she did not have any loss of taste or smell.  I advised her of the symptoms of covid and she will try and go today for testing.  Can you recommend anything for her symptoms?

## 2019-05-05 NOTE — Telephone Encounter (Signed)
I cannot treat again until she gets tested, once that is done can send in Cefdinir 300 mg po bid x 7 days. alsos add some plain mucinex twice a day, probiotics and elderberry capsules. Make another appointment if worsens

## 2019-05-05 NOTE — Telephone Encounter (Signed)
Pt called for a refill for oxyCODONE-acetaminophen (PERCOCET) 10-325 MG tablet  Pt states she was suppose to refill it on 10.2.20 but couldn't reach anyone. Pt states she is out of medication/ please advise

## 2019-05-05 NOTE — Telephone Encounter (Signed)
mychart message sent to patient

## 2019-05-05 NOTE — Telephone Encounter (Signed)
Requesting:oxycodone  Contract:yes [id number] risk next screen 07/09/19 Last OV:04/21/19 Next OV:08/14/19 Last Refill:04/04/19  #[id number] Database:     Please advise

## 2019-05-06 ENCOUNTER — Encounter: Payer: Self-pay | Admitting: Family Medicine

## 2019-05-07 ENCOUNTER — Encounter: Payer: Self-pay | Admitting: Family Medicine

## 2019-05-07 ENCOUNTER — Other Ambulatory Visit: Payer: Self-pay | Admitting: *Deleted

## 2019-05-07 ENCOUNTER — Other Ambulatory Visit: Payer: Self-pay

## 2019-05-07 DIAGNOSIS — Z20822 Contact with and (suspected) exposure to covid-19: Secondary | ICD-10-CM

## 2019-05-07 DIAGNOSIS — Z20828 Contact with and (suspected) exposure to other viral communicable diseases: Secondary | ICD-10-CM | POA: Diagnosis not present

## 2019-05-07 NOTE — Progress Notes (Signed)
Error

## 2019-05-08 ENCOUNTER — Encounter: Payer: Self-pay | Admitting: Hematology & Oncology

## 2019-05-08 LAB — NOVEL CORONAVIRUS, NAA: SARS-CoV-2, NAA: NOT DETECTED

## 2019-05-12 ENCOUNTER — Encounter: Payer: Self-pay | Admitting: Family Medicine

## 2019-05-13 ENCOUNTER — Other Ambulatory Visit: Payer: Self-pay

## 2019-05-13 ENCOUNTER — Encounter: Payer: Self-pay | Admitting: Hematology & Oncology

## 2019-05-13 ENCOUNTER — Inpatient Hospital Stay: Payer: Medicare Other | Attending: Hematology & Oncology | Admitting: Hematology & Oncology

## 2019-05-13 ENCOUNTER — Inpatient Hospital Stay: Payer: Medicare Other

## 2019-05-13 ENCOUNTER — Telehealth: Payer: Self-pay | Admitting: Hematology & Oncology

## 2019-05-13 ENCOUNTER — Encounter: Payer: Self-pay | Admitting: Family Medicine

## 2019-05-13 VITALS — BP 127/69 | HR 88 | Temp 98.2°F | Resp 18 | Ht 63.5 in | Wt 151.2 lb

## 2019-05-13 DIAGNOSIS — D7581 Myelofibrosis: Secondary | ICD-10-CM | POA: Insufficient documentation

## 2019-05-13 DIAGNOSIS — Z7982 Long term (current) use of aspirin: Secondary | ICD-10-CM | POA: Diagnosis not present

## 2019-05-13 DIAGNOSIS — R110 Nausea: Secondary | ICD-10-CM | POA: Diagnosis not present

## 2019-05-13 DIAGNOSIS — R5383 Other fatigue: Secondary | ICD-10-CM | POA: Diagnosis not present

## 2019-05-13 DIAGNOSIS — Z23 Encounter for immunization: Secondary | ICD-10-CM

## 2019-05-13 DIAGNOSIS — R109 Unspecified abdominal pain: Secondary | ICD-10-CM | POA: Diagnosis not present

## 2019-05-13 DIAGNOSIS — Z79899 Other long term (current) drug therapy: Secondary | ICD-10-CM | POA: Diagnosis not present

## 2019-05-13 DIAGNOSIS — R12 Heartburn: Secondary | ICD-10-CM | POA: Diagnosis not present

## 2019-05-13 DIAGNOSIS — R0602 Shortness of breath: Secondary | ICD-10-CM | POA: Diagnosis not present

## 2019-05-13 DIAGNOSIS — D473 Essential (hemorrhagic) thrombocythemia: Secondary | ICD-10-CM | POA: Insufficient documentation

## 2019-05-13 DIAGNOSIS — D500 Iron deficiency anemia secondary to blood loss (chronic): Secondary | ICD-10-CM

## 2019-05-13 DIAGNOSIS — R42 Dizziness and giddiness: Secondary | ICD-10-CM | POA: Diagnosis not present

## 2019-05-13 DIAGNOSIS — D509 Iron deficiency anemia, unspecified: Secondary | ICD-10-CM | POA: Diagnosis not present

## 2019-05-13 DIAGNOSIS — E032 Hypothyroidism due to medicaments and other exogenous substances: Secondary | ICD-10-CM

## 2019-05-13 DIAGNOSIS — M7989 Other specified soft tissue disorders: Secondary | ICD-10-CM | POA: Insufficient documentation

## 2019-05-13 DIAGNOSIS — R61 Generalized hyperhidrosis: Secondary | ICD-10-CM | POA: Diagnosis not present

## 2019-05-13 DIAGNOSIS — M2550 Pain in unspecified joint: Secondary | ICD-10-CM | POA: Insufficient documentation

## 2019-05-13 DIAGNOSIS — E162 Hypoglycemia, unspecified: Secondary | ICD-10-CM | POA: Insufficient documentation

## 2019-05-13 DIAGNOSIS — M791 Myalgia, unspecified site: Secondary | ICD-10-CM | POA: Diagnosis not present

## 2019-05-13 DIAGNOSIS — D75839 Thrombocytosis, unspecified: Secondary | ICD-10-CM

## 2019-05-13 LAB — CMP (CANCER CENTER ONLY)
ALT: 14 U/L (ref 0–44)
AST: 20 U/L (ref 15–41)
Albumin: 3.9 g/dL (ref 3.5–5.0)
Alkaline Phosphatase: 86 U/L (ref 38–126)
Anion gap: 7 (ref 5–15)
BUN: 15 mg/dL (ref 8–23)
CO2: 31 mmol/L (ref 22–32)
Calcium: 9 mg/dL (ref 8.9–10.3)
Chloride: 104 mmol/L (ref 98–111)
Creatinine: 0.88 mg/dL (ref 0.44–1.00)
GFR, Est AFR Am: >60 mL/min (ref >60)
GFR, Estimated: >60 mL/min (ref >60)
Glucose, Bld: 56 mg/dL — ABNORMAL LOW (ref 70–99)
Potassium: 3.7 mmol/L (ref 3.5–5.1)
Sodium: 142 mmol/L (ref 135–145)
Total Bilirubin: 0.2 mg/dL — ABNORMAL LOW (ref 0.3–1.2)
Total Protein: 7.1 g/dL (ref 6.5–8.1)

## 2019-05-13 LAB — CBC WITH DIFFERENTIAL (CANCER CENTER ONLY)
Abs Immature Granulocytes: 0.04 10*3/uL (ref 0.00–0.07)
Basophils Absolute: 0 10*3/uL (ref 0.0–0.1)
Basophils Relative: 1 %
Eosinophils Absolute: 0.4 10*3/uL (ref 0.0–0.5)
Eosinophils Relative: 9 %
HCT: 33.6 % — ABNORMAL LOW (ref 36.0–46.0)
Hemoglobin: 10.7 g/dL — ABNORMAL LOW (ref 12.0–15.0)
Immature Granulocytes: 1 %
Lymphocytes Relative: 23 %
Lymphs Abs: 1 10*3/uL (ref 0.7–4.0)
MCH: 33.6 pg (ref 26.0–34.0)
MCHC: 31.8 g/dL (ref 30.0–36.0)
MCV: 105.7 fL — ABNORMAL HIGH (ref 80.0–100.0)
Monocytes Absolute: 0.4 10*3/uL (ref 0.1–1.0)
Monocytes Relative: 10 %
Neutro Abs: 2.4 10*3/uL (ref 1.7–7.7)
Neutrophils Relative %: 56 %
Platelet Count: 557 10*3/uL — ABNORMAL HIGH (ref 150–400)
RBC: 3.18 MIL/uL — ABNORMAL LOW (ref 3.87–5.11)
RDW: 14.6 % (ref 11.5–15.5)
WBC Count: 4.2 10*3/uL (ref 4.0–10.5)
nRBC: 0 % (ref 0.0–0.2)

## 2019-05-13 LAB — SAMPLE TO BLOOD BANK

## 2019-05-13 LAB — RETICULOCYTES
Immature Retic Fract: 17.1 % — ABNORMAL HIGH (ref 2.3–15.9)
RBC.: 3.21 MIL/uL — ABNORMAL LOW (ref 3.87–5.11)
Retic Count, Absolute: 42.7 10*3/uL (ref 19.0–186.0)
Retic Ct Pct: 1.3 % (ref 0.4–3.1)

## 2019-05-13 LAB — SAVE SMEAR(SSMR), FOR PROVIDER SLIDE REVIEW

## 2019-05-13 MED ORDER — INFLUENZA VAC SPLIT QUAD 0.5 ML IM SUSY
PREFILLED_SYRINGE | INTRAMUSCULAR | Status: AC
  Filled 2019-05-13: qty 0.5

## 2019-05-13 MED ORDER — OPTICHAMBER DIAMOND MISC: 0 refills | Status: DC

## 2019-05-13 MED ORDER — PNEUMOCOCCAL 13-VAL CONJ VACC IM SUSP
0.5000 mL | Freq: Once | INTRAMUSCULAR | Status: AC
  Administered 2019-05-13: 0.5 mL via INTRAMUSCULAR
  Filled 2019-05-13: qty 0.5

## 2019-05-13 MED ORDER — INFLUENZA VAC SPLIT QUAD 0.5 ML IM SUSY
0.5000 mL | PREFILLED_SYRINGE | Freq: Once | INTRAMUSCULAR | Status: AC
  Administered 2019-05-13: 0.5 mL via INTRAMUSCULAR

## 2019-05-13 NOTE — Patient Instructions (Signed)
Preventing Influenza, Adult Influenza, more commonly known as the flu, is a viral infection that mainly affects the respiratory tract. The respiratory tract includes structures that help you breathe, such as the lungs, nose, and throat. The flu causes many common cold symptoms, as well as a high fever and body aches. The flu spreads easily from person to person (is contagious). The flu is most common from December through March. This is called flu season.You can catch the flu virus by:  Breathing in droplets from an infected person's cough or sneeze.  Touching something that was recently contaminated with the virus and then touching your mouth, nose, or eyes. What can I do to lower my risk?        You can decrease your risk of getting the flu by:  Getting a flu shot (influenza vaccination) every year. This is the best way to prevent the flu. A flu shot is recommended for everyone age 52 months and older. ? It is best to get a flu shot in the fall, as soon as it is available. Getting a flu shot during winter or spring instead is still a good idea. Flu season can last into early spring. ? Preventing the flu through vaccination requires getting a new flu shot every year. This is because the flu virus changes slightly (mutates) from one year to the next. Even if a flu shot does not completely protect you from all flu virus mutations, it can reduce the severity of your illness and prevent dangerous complications of the flu. ? If you are pregnant, you can and should get a flu shot. ? If you have had a reaction to the shot in the past or if you are allergic to eggs, check with your health care provider before getting a flu shot. ? Sometimes the vaccine is available as a nasal spray. In some years, the nasal spray has not been as effective against the flu virus. Check with your health care provider if you have questions about this.  Practicing good health habits. This is especially important during  flu season. ? Avoid contact with people who are sick with flu or cold symptoms. ? Wash your hands with soap and water often. If soap and water are not available, use alcohol-based hand sanitizer. ? Avoid touching your hands to your face, especially when you have not washed your hands recently. ? Use a disinfectant to clean surfaces at home and at work that may be contaminated with the flu virus. ? Keep your bodys disease-fighting system (immune system) in good shape by eating a healthy diet, drinking plenty of fluids, getting enough sleep, and exercising regularly. If you do get the flu, avoid spreading it to others by:  Staying home until your symptoms have been gone for at least one day.  Covering your mouth and nose when you cough or sneeze.  Avoiding close contact with others, especially babies and elderly people. Why are these changes important? Getting a flu shot and practicing good health habits protects you as well as other people. If you get the flu, your friends, family, and co-workers are also at risk of getting it, because it spreads so easily to others. Each year, about 2 out of every 10 people get the flu. Having the flu can lead to complications, such as pneumonia, ear infection, and sinus infection. The flu also can be deadly, especially for babies, people older than age 38, and people who have serious long-term diseases. How is this treated? Most  people recover from the flu by resting at home and drinking plenty of fluids. However, a prescription antiviral medicine may reduce your flu symptoms and may make your flu go away sooner. This medicine must be started within a few days of getting flu symptoms. You can talk with your health care provider about whether you need an antiviral medicine. Antiviral medicine may be prescribed for people who are at risk for more serious flu symptoms. This includes people who:  Are older than age 90.  Are pregnant.  Have a condition that  makes the flu worse or more dangerous. Where to find more information  Centers for Disease Control and Prevention: http://www.smith-bell.org/  [employer].com.ee: azureicus.com  American Academy of Family Physicians: familydoctor.org/familydoctor/en/kids/vaccines/preventing-the-flu.html Contact a health care provider if:  You have influenza and you develop new symptoms.  You have: ? Chest pain. ? Diarrhea. ? A fever.  Your cough gets worse, or you produce more mucus. Summary  The best way to prevent the flu is to get a flu shot every year in the fall.  Even if you get the flu after you have received the yearly vaccine, your flu may be milder and go away sooner because of your flu shot.  If you get the flu, antiviral medicines that are started with a few days of symptoms may reduce your flu symptoms and may make your flu go away sooner.  You can also help prevent the flu by practicing good health habits. This information is not intended to replace advice given to you by your health care provider. Make sure you discuss any questions you have with your health care provider. Document Released: 08/01/2015 Document Revised: 06/29/2017 Document Reviewed: 03/25/2016 Elsevier Patient Education  [place]. Pneumococcal Conjugate Vaccine suspension for injection What is this medicine? PNEUMOCOCCAL VACCINE (NEU mo KOK al vak SEEN) is a vaccine used to prevent pneumococcus bacterial infections. These bacteria can cause serious infections like pneumonia, meningitis, and blood infections. This vaccine will lower your chance of getting pneumonia. If you do get pneumonia, it can make your symptoms milder and your illness shorter. This vaccine will not treat an infection and will not cause infection. This vaccine is recommended for infants and young children, adults with certain medical conditions, and adults 13 years or older. This medicine may be used for other purposes; ask your  health care provider or pharmacist if you have questions. COMMON BRAND NAME(S): Prevnar, Prevnar 13 What should I tell my health care provider before I take this medicine? They need to know if you have any of these conditions:  bleeding problems  fever  immune system problems  an unusual or allergic reaction to pneumococcal vaccine, diphtheria toxoid, other vaccines, latex, other medicines, foods, dyes, or preservatives  pregnant or trying to get pregnant  breast-feeding How should I use this medicine? This vaccine is for injection into a muscle. It is given by a health care professional. A copy of Vaccine Information Statements will be given before each vaccination. Read this sheet carefully each time. The sheet may change frequently. Talk to your pediatrician regarding the use of this medicine in children. While this drug may be prescribed for children as young as 48 weeks old for selected conditions, precautions do apply. Overdosage: If you think you have taken too much of this medicine contact a poison control center or emergency room at once. NOTE: This medicine is only for you. Do not share this medicine with others. What if I miss a dose? It  is important not to miss your dose. Call your doctor or health care professional if you are unable to keep an appointment. What may interact with this medicine?  medicines for cancer chemotherapy  medicines that suppress your immune function  steroid medicines like prednisone or cortisone This list may not describe all possible interactions. Give your health care provider a list of all the medicines, herbs, non-prescription drugs, or dietary supplements you use. Also tell them if you smoke, drink alcohol, or use illegal drugs. Some items may interact with your medicine. What should I watch for while using this medicine? Mild fever and pain should go away in 3 days or less. Report any unusual symptoms to your doctor or health care  professional. What side effects may I notice from receiving this medicine? Side effects that you should report to your doctor or health care professional as soon as possible:  allergic reactions like skin rash, itching or hives, swelling of the face, lips, or tongue  breathing problems  confused  fast or irregular heartbeat  fever over 102 degrees F  seizures  unusual bleeding or bruising  unusual muscle weakness Side effects that usually do not require medical attention (report to your doctor or health care professional if they continue or are bothersome):  aches and pains  diarrhea  fever of 102 degrees F or less  headache  irritable  loss of appetite  pain, tender at site where injected  trouble sleeping This list may not describe all possible side effects. Call your doctor for medical advice about side effects. You may report side effects to FDA at 1-800-FDA-1088. Where should I keep my medicine? This does not apply. This vaccine is given in a clinic, pharmacy, doctor's office, or other health care setting and will not be stored at home. NOTE: This sheet is a summary. It may not cover all possible information. If you have questions about this medicine, talk to your doctor, pharmacist, or health care provider.  2020 Elsevier/Gold Standard (2014-04-23 10:27:27)
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
## 2019-05-13 NOTE — Telephone Encounter (Signed)
Spoke with patient to confirm 11/9 appt at 11 am

## 2019-05-14 ENCOUNTER — Encounter: Payer: Self-pay | Admitting: Family Medicine

## 2019-05-14 ENCOUNTER — Encounter: Payer: Self-pay | Admitting: *Deleted

## 2019-05-14 DIAGNOSIS — E032 Hypothyroidism due to medicaments and other exogenous substances: Secondary | ICD-10-CM | POA: Diagnosis present

## 2019-05-14 DIAGNOSIS — Z23 Encounter for immunization: Secondary | ICD-10-CM

## 2019-05-14 LAB — TSH: TSH: 0.088 u[IU]/mL — ABNORMAL LOW (ref 0.308–3.960)

## 2019-05-14 LAB — IRON AND TIBC
Iron: 90 ug/dL (ref 41–142)
Saturation Ratios: 51 % (ref 21–57)
TIBC: 178 ug/dL — ABNORMAL LOW (ref 236–444)
UIBC: 88 ug/dL — ABNORMAL LOW (ref 120–384)

## 2019-05-14 LAB — FERRITIN: Ferritin: 2303 ng/mL — ABNORMAL HIGH (ref 11–307)

## 2019-05-19 MED FILL — PEGASYS 180 MCG/ML VIAL: 180 | 28 days supply | Qty: 4 | Fill #1

## 2019-05-19 MED FILL — BD TB SYRINGE 27GX1/2: 27G X 1/2" | 28 days supply | Qty: 4 | Fill #8

## 2019-05-19 MED FILL — BD TB SYRINGE 27GX1/2": 27G X 1/2" | 28 days supply | Qty: 4 | Fill #8

## 2019-05-20 ENCOUNTER — Encounter: Payer: Self-pay | Admitting: Hematology & Oncology

## 2019-05-24 ENCOUNTER — Other Ambulatory Visit: Payer: Self-pay | Admitting: Hematology & Oncology

## 2019-05-27 ENCOUNTER — Encounter: Payer: Self-pay | Admitting: Family Medicine

## 2019-05-28 ENCOUNTER — Encounter: Payer: Self-pay | Admitting: Family Medicine

## 2019-05-29 ENCOUNTER — Encounter: Payer: Self-pay | Admitting: Family Medicine

## 2019-05-29 DIAGNOSIS — R739 Hyperglycemia, unspecified: Secondary | ICD-10-CM

## 2019-05-29 DIAGNOSIS — E559 Vitamin D deficiency, unspecified: Secondary | ICD-10-CM

## 2019-05-29 DIAGNOSIS — I10 Essential (primary) hypertension: Secondary | ICD-10-CM

## 2019-05-29 DIAGNOSIS — R300 Dysuria: Secondary | ICD-10-CM

## 2019-05-29 NOTE — Telephone Encounter (Signed)
Sent to [place] to look into

## 2019-05-29 NOTE — Telephone Encounter (Signed)
Error. Please disregard message below

## 2019-05-30 MED ORDER — GABAPENTIN 400 MG PO CAPS: 400.0000 mg | ORAL_CAPSULE | Freq: Every day | ORAL | 1 refills | Status: DC

## 2019-05-30 MED ORDER — GABAPENTIN 100 MG PO CAPS: ORAL_CAPSULE | ORAL | 1 refills | Status: DC

## 2019-05-30 MED ORDER — CHLORPHENIRAMINE MALEATE 4 MG PO TABS: 8.0000 mg | ORAL_TABLET | Freq: Every evening | ORAL | 1 refills | Status: DC | PRN

## 2019-05-30 MED ORDER — LEVOTHYROXINE SODIUM 100 MCG PO TABS: ORAL_TABLET | ORAL | 1 refills | Status: DC

## 2019-05-30 MED ORDER — FAMOTIDINE 40 MG PO TABS: 40.0000 mg | ORAL_TABLET | Freq: Every day | ORAL | 1 refills | Status: DC

## 2019-05-30 MED ORDER — ATORVASTATIN CALCIUM 10 MG PO TABS: 10.0000 mg | ORAL_TABLET | Freq: Every day | ORAL | 3 refills | Status: DC

## 2019-05-30 MED ORDER — FUROSEMIDE 20 MG PO TABS: ORAL_TABLET | ORAL | 1 refills | Status: DC

## 2019-05-30 MED ORDER — ESTRADIOL 0.5 MG PO TABS: 0.5000 mg | ORAL_TABLET | Freq: Two times a day (BID) | ORAL | 1 refills | Status: DC

## 2019-06-02 ENCOUNTER — Encounter: Payer: Self-pay | Admitting: Family Medicine

## 2019-06-02 ENCOUNTER — Other Ambulatory Visit: Payer: Self-pay

## 2019-06-02 DIAGNOSIS — D500 Iron deficiency anemia secondary to blood loss (chronic): Secondary | ICD-10-CM

## 2019-06-02 DIAGNOSIS — E032 Hypothyroidism due to medicaments and other exogenous substances: Secondary | ICD-10-CM

## 2019-06-02 MED ORDER — SANCUSO 3.1 MG/24HR TD PTCH: MEDICATED_PATCH | TRANSDERMAL | 0 refills | Status: DC

## 2019-06-04 ENCOUNTER — Telehealth: Payer: Self-pay | Admitting: *Deleted

## 2019-06-04 NOTE — Telephone Encounter (Signed)
Copied from [place] [id number]. Topic: General - Other >> Jun 03, 2019 10:59 AM Yvette Rack wrote: Reason for CRM: CJ with [facility] stated she will be faxing over a list of 21 of patient's medications. Cb# [phone]

## 2019-06-09 ENCOUNTER — Encounter: Payer: Self-pay | Admitting: Family Medicine

## 2019-06-09 ENCOUNTER — Ambulatory Visit: Payer: Medicare Other | Admitting: Hematology & Oncology

## 2019-06-09 ENCOUNTER — Other Ambulatory Visit: Payer: Medicare Other

## 2019-06-09 ENCOUNTER — Other Ambulatory Visit: Payer: Self-pay | Admitting: Family Medicine

## 2019-06-09 DIAGNOSIS — D473 Essential (hemorrhagic) thrombocythemia: Secondary | ICD-10-CM

## 2019-06-09 DIAGNOSIS — G8929 Other chronic pain: Secondary | ICD-10-CM

## 2019-06-09 DIAGNOSIS — D500 Iron deficiency anemia secondary to blood loss (chronic): Secondary | ICD-10-CM

## 2019-06-09 MED ORDER — OXYCODONE-ACETAMINOPHEN 10-325 MG PO TABS: 1.0000 | ORAL_TABLET | Freq: Four times a day (QID) | ORAL | 0 refills | Status: DC | PRN

## 2019-06-09 MED ORDER — BUDESONIDE-FORMOTEROL FUMARATE 80-4.5 MCG/ACT IN AERO: 2.0000 | INHALATION_SPRAY | Freq: Two times a day (BID) | RESPIRATORY_TRACT | 1 refills | Status: DC

## 2019-06-09 MED ORDER — TRAZODONE HCL 100 MG PO TABS: 100.0000 mg | ORAL_TABLET | Freq: Every day | ORAL | 0 refills | Status: DC

## 2019-06-09 MED ORDER — VENLAFAXINE HCL ER 75 MG PO CP24: 75.0000 mg | ORAL_CAPSULE | Freq: Three times a day (TID) | ORAL | 3 refills | Status: DC

## 2019-06-09 MED ORDER — RESTASIS 0.05 % OP EMUL: OPHTHALMIC | 3 refills | Status: DC

## 2019-06-09 MED ORDER — ALBUTEROL SULFATE HFA 108 (90 BASE) MCG/ACT IN AERS: INHALATION_SPRAY | RESPIRATORY_TRACT | 0 refills | Status: AC

## 2019-06-09 NOTE — Telephone Encounter (Signed)
Message has been taken care of.  She has sent several of them.

## 2019-06-10 ENCOUNTER — Encounter: Payer: Self-pay | Admitting: Family Medicine

## 2019-06-10 ENCOUNTER — Other Ambulatory Visit: Payer: Self-pay | Admitting: Family Medicine

## 2019-06-10 MED ORDER — ALPRAZOLAM 1 MG PO TABS: 1.0000 mg | ORAL_TABLET | Freq: Three times a day (TID) | ORAL | 0 refills | Status: DC | PRN

## 2019-06-13 ENCOUNTER — Other Ambulatory Visit: Payer: Self-pay | Admitting: Family

## 2019-06-13 ENCOUNTER — Other Ambulatory Visit: Payer: Self-pay | Admitting: Family Medicine

## 2019-06-13 DIAGNOSIS — D500 Iron deficiency anemia secondary to blood loss (chronic): Secondary | ICD-10-CM

## 2019-06-13 DIAGNOSIS — R300 Dysuria: Secondary | ICD-10-CM

## 2019-06-13 DIAGNOSIS — D7581 Myelofibrosis: Secondary | ICD-10-CM

## 2019-06-13 DIAGNOSIS — R739 Hyperglycemia, unspecified: Secondary | ICD-10-CM

## 2019-06-13 DIAGNOSIS — D473 Essential (hemorrhagic) thrombocythemia: Secondary | ICD-10-CM

## 2019-06-13 DIAGNOSIS — I10 Essential (primary) hypertension: Secondary | ICD-10-CM

## 2019-06-13 DIAGNOSIS — E559 Vitamin D deficiency, unspecified: Secondary | ICD-10-CM

## 2019-06-13 MED ORDER — MECLIZINE HCL 12.5 MG PO TABS: ORAL_TABLET | ORAL | 0 refills | Status: DC

## 2019-06-13 MED ORDER — ONDANSETRON HCL 8 MG PO TABS: ORAL_TABLET | ORAL | 1 refills | Status: DC

## 2019-06-13 MED ORDER — NYSTATIN 100000 UNIT/ML MT SUSP: 5.0000 mL | Freq: Four times a day (QID) | OROMUCOSAL | 3 refills | Status: DC

## 2019-06-13 NOTE — Telephone Encounter (Signed)
meclizine (ANTIVERT) 12.5 MG tablet  nystatin (MYCOSTATIN) 100000 UNIT/ML suspension ondansetron (ZOFRAN) 8 MG tablet  SSD 1 % cream  ALPRAZolam (XANAX) 1 MG tablet   Patient requesting refills sent to [facility].     [place], [place] [phone] (Phone) [phone] (Fax)

## 2019-06-13 NOTE — Telephone Encounter (Signed)
Requested medication (s) are due for refill today: yes  Requested medication (s) are on the active medication list: yes  Last refill:  06/10/2019  Future visit scheduled: yes  Notes to clinic:  Refill cannot be delegated  Patient would like script sent to [facility]   Requested Prescriptions  Pending Prescriptions Disp Refills   ALPRAZolam (XANAX) 1 MG tablet 90 tablet 0    Sig: Take 1 tablet (1 mg total) by mouth 3 (three) times daily as needed for anxiety.     Not Delegated - Psychiatry:  Anxiolytics/Hypnotics Failed - 06/13/2019 11:22 AM      Failed - This refill cannot be delegated      Passed - Urine Drug Screen completed in last 360 days.      Passed - Valid encounter within last 6 months    Recent Outpatient Visits          1 month ago Pharyngitis, unspecified etiology   [facility] at [place], MD   2 months ago Fall, sequela   [facility]ivist at [facility], Bonnita Levan, MD   3 months ago Breast cancer screening   Archivist at [place], MD   3 months ago Essential hypertension   [facility]ivist at [place], MD   4 months ago Essential hypertension   [facility] at [facility] Mosie Lukes, MD      Future Appointments            In 2 months Mosie Lukes, MD [place] at [facility]            meclizine (ANTIVERT) 12.5 MG tablet 40 tablet 0    Sig: TAKE 1 TABLET BY MOUTH THREE TIMES DAILY AS NEEDED FOR DIZZINESS     Not Delegated - Gastroenterology: Antiemetics Failed - 06/13/2019 11:22 AM      Failed - This refill cannot be delegated      Passed - Valid encounter within last 6 months    Recent Outpatient Visits          1 month ago Pharyngitis, unspecified etiology   Archivist at [place], MD   2 months ago Fall, sequela   Archivist at [facility], Bonnita Levan, MD   3 months ago Breast cancer screening   Archivist at [place], MD   3 months ago Essential hypertension   Archivist at [place], MD   4 months ago Essential hypertension   Archivist at [place], MD      Future Appointments            In 2 months Mosie Lukes, MD [place] at [facility]            nystatin (MYCOSTATIN) 100000 UNIT/ML suspension 60 mL      Off-Protocol Failed - 06/13/2019 11:22 AM      Failed - Medication not assigned to a protocol, review manually.      Passed - Valid encounter within last 12 months    Recent Outpatient Visits          1 month ago Pharyngitis,  unspecified etiology   Archivist at [place], MD   2 months ago Fall, sequela   Archivist at [place], MD   3 months ago Breast cancer screening   Archivist at [place], MD   3 months ago Essential hypertension   Archivist at [place], MD   4 months ago Essential hypertension   Archivist at [place], MD      Future Appointments            In 2 months Mosie Lukes, MD [place] at [facility], [facility]            ondansetron (ZOFRAN) 8 MG tablet 90 tablet 1    Sig: TAKE 1/2 TO 1 TABLET BY MOUTH EVERY 8 HOURS AS NEEDED FOR NAUSEA AND VOMITING     Not Delegated - Gastroenterology: Antiemetics Failed - 06/13/2019 11:22 AM      Failed - This refill cannot be delegated      Passed - Valid encounter within last 6 months    Recent  Outpatient Visits          1 month ago Pharyngitis, unspecified etiology   Archivist at [place], MD   2 months ago Fall, sequela   Archivist at [place], MD   3 months ago Breast cancer screening   Archivist at [place], MD   3 months ago Essential hypertension   Archivist at [place], MD   4 months ago Essential hypertension   Archivist at [place], MD      Future Appointments            In 2 months Mosie Lukes, MD [place] at [place]

## 2019-06-16 ENCOUNTER — Inpatient Hospital Stay: Payer: Medicare Other | Attending: Hematology & Oncology

## 2019-06-16 ENCOUNTER — Inpatient Hospital Stay (HOSPITAL_BASED_OUTPATIENT_CLINIC_OR_DEPARTMENT_OTHER): Payer: Medicare Other | Admitting: Family

## 2019-06-16 ENCOUNTER — Other Ambulatory Visit: Payer: Self-pay

## 2019-06-16 VITALS — BP 128/84 | HR 103 | Temp 97.3°F | Resp 18 | Wt 151.5 lb

## 2019-06-16 DIAGNOSIS — Z7982 Long term (current) use of aspirin: Secondary | ICD-10-CM | POA: Insufficient documentation

## 2019-06-16 DIAGNOSIS — D509 Iron deficiency anemia, unspecified: Secondary | ICD-10-CM | POA: Diagnosis not present

## 2019-06-16 DIAGNOSIS — R52 Pain, unspecified: Secondary | ICD-10-CM | POA: Diagnosis not present

## 2019-06-16 DIAGNOSIS — D500 Iron deficiency anemia secondary to blood loss (chronic): Secondary | ICD-10-CM

## 2019-06-16 DIAGNOSIS — E039 Hypothyroidism, unspecified: Secondary | ICD-10-CM

## 2019-06-16 DIAGNOSIS — R5382 Chronic fatigue, unspecified: Secondary | ICD-10-CM | POA: Insufficient documentation

## 2019-06-16 DIAGNOSIS — R0602 Shortness of breath: Secondary | ICD-10-CM | POA: Insufficient documentation

## 2019-06-16 DIAGNOSIS — Z79899 Other long term (current) drug therapy: Secondary | ICD-10-CM | POA: Diagnosis not present

## 2019-06-16 DIAGNOSIS — D7581 Myelofibrosis: Secondary | ICD-10-CM | POA: Diagnosis not present

## 2019-06-16 DIAGNOSIS — E032 Hypothyroidism due to medicaments and other exogenous substances: Secondary | ICD-10-CM

## 2019-06-16 DIAGNOSIS — M1990 Unspecified osteoarthritis, unspecified site: Secondary | ICD-10-CM | POA: Diagnosis not present

## 2019-06-16 DIAGNOSIS — D473 Essential (hemorrhagic) thrombocythemia: Secondary | ICD-10-CM | POA: Diagnosis not present

## 2019-06-16 DIAGNOSIS — R519 Headache, unspecified: Secondary | ICD-10-CM | POA: Insufficient documentation

## 2019-06-16 LAB — CBC WITH DIFFERENTIAL (CANCER CENTER ONLY)
Abs Immature Granulocytes: 0.01 10*3/uL (ref 0.00–0.07)
Basophils Absolute: 0 10*3/uL (ref 0.0–0.1)
Basophils Relative: 0 %
Eosinophils Absolute: 0.1 10*3/uL (ref 0.0–0.5)
Eosinophils Relative: 3 %
HCT: 36.1 % (ref 36.0–46.0)
Hemoglobin: 11.5 g/dL — ABNORMAL LOW (ref 12.0–15.0)
Immature Granulocytes: 0 %
Lymphocytes Relative: 19 %
Lymphs Abs: 0.9 10*3/uL (ref 0.7–4.0)
MCH: 33.9 pg (ref 26.0–34.0)
MCHC: 31.9 g/dL (ref 30.0–36.0)
MCV: 106.5 fL — ABNORMAL HIGH (ref 80.0–100.0)
Monocytes Absolute: 0.4 10*3/uL (ref 0.1–1.0)
Monocytes Relative: 7 %
Neutro Abs: 3.4 10*3/uL (ref 1.7–7.7)
Neutrophils Relative %: 71 %
Platelet Count: 579 10*3/uL — ABNORMAL HIGH (ref 150–400)
RBC: 3.39 MIL/uL — ABNORMAL LOW (ref 3.87–5.11)
RDW: 14.4 % (ref 11.5–15.5)
WBC Count: 4.8 10*3/uL (ref 4.0–10.5)
nRBC: 0 % (ref 0.0–0.2)

## 2019-06-16 LAB — RETICULOCYTES
Immature Retic Fract: 12.3 % (ref 2.3–15.9)
RBC.: 3.33 MIL/uL — ABNORMAL LOW (ref 3.87–5.11)
Retic Count, Absolute: 34.6 10*3/uL (ref 19.0–186.0)
Retic Ct Pct: 1 % (ref 0.4–3.1)

## 2019-06-16 LAB — SAVE SMEAR(SSMR), FOR PROVIDER SLIDE REVIEW

## 2019-06-16 LAB — CMP (CANCER CENTER ONLY)
ALT: 21 U/L (ref 0–44)
AST: 31 U/L (ref 15–41)
Albumin: 4.5 g/dL (ref 3.5–5.0)
Alkaline Phosphatase: 92 U/L (ref 38–126)
Anion gap: 8 (ref 5–15)
BUN: 15 mg/dL (ref 8–23)
CO2: 30 mmol/L (ref 22–32)
Calcium: 9.4 mg/dL (ref 8.9–10.3)
Chloride: 102 mmol/L (ref 98–111)
Creatinine: 1.02 mg/dL — ABNORMAL HIGH (ref 0.44–1.00)
GFR, Est AFR Am: >60 mL/min (ref >60)
GFR, Estimated: 57 mL/min — ABNORMAL LOW (ref >60)
Glucose, Bld: 111 mg/dL — ABNORMAL HIGH (ref 70–99)
Potassium: 3.9 mmol/L (ref 3.5–5.1)
Sodium: 140 mmol/L (ref 135–145)
Total Bilirubin: 0.5 mg/dL (ref 0.3–1.2)
Total Protein: 7.6 g/dL (ref 6.5–8.1)

## 2019-06-16 NOTE — Progress Notes (Signed)
Hematology and Oncology Follow Up Visit  Rachael Johnson:[id number] [birth year] 68 y.o. 06/16/2019   Principle Diagnosis:  Post-ET myelofibrosis Essential thrombocythemia - JAK2 negative -- NGS (-) Intermittent iron-deficiency anemia  Past Therapy: Hydrea 1000/1000/500 mg by mouth daily - on hold starting 05/14/2017 - DC'd on 05/28/2017  Current Therapy:   Aspirin 81 mg by mouth daily IV iron as indicated - last dose given on 08/16/2018 PEG-Interferon 45 mcg sq q week -- start 03/17/2019   Interim History:  Ms. Krenz is here today for follow-up. She is doing fairly well but has chronic fatigue.  She has headaches off and on. She states that these worsen when her platelets are high.  No episodes of bleeding. No bruising or petechiae.  She has SOB with over exertion and takes a break to rest when needed. She has 2 inhalers that she uses when needed.  No fever, chills, n/v, cough, rash, dizziness, chest pain, palpitations, abdominal pain or changes in bowel or bladder habits.  No swelling in her extremities at this time.  She has generalized aches and pains due to arthritis.  She states that her appetite comes and goes. She is staying well hydrated. Her weight is stable.   ECOG Performance Status: 1 - Symptomatic but completely ambulatory  Medications:  Allergies as of 06/16/2019   No Known Allergies     Medication List       Accurate as of June 16, 2019  2:44 PM. If you have any questions, ask your nurse or doctor.        albuterol 108 (90 Base) MCG/ACT inhaler Commonly known as: VENTOLIN HFA INHALE 2 PUFFS BY MOUTH EVERY 6 HOURS AS NEEDED FOR WHEEZING OR SHORTNESS OF BREATH   ALPRAZolam 1 MG tablet Commonly known as: XANAX Take 1 tablet (1 mg total) by mouth 3 (three) times daily as needed for anxiety.   atorvastatin 10 MG tablet Commonly known as: LIPITOR Take 1 tablet (10 mg total) by mouth daily.   budesonide-formoterol 80-4.5 MCG/ACT inhaler Commonly  known as: Symbicort Inhale 2 puffs into the lungs 2 (two) times daily.   chlorpheniramine 4 MG tablet Commonly known as: CHLOR-TRIMETON Take 2 tablets (8 mg total) by mouth at bedtime as needed for allergies.   Cranberry 300 MG tablet Take 300 mg by mouth 2 (two) times daily.   cyclobenzaprine 10 MG tablet Commonly known as: FLEXERIL TAKE 1 TABLET BY MOUTH TWICE DAILY AS NEEDED FOR MUSCLE SPASM   EQ Allergy Relief Childrens 12.5 MG/5ML liquid Generic drug: diphenhydrAMINE TAKE 5 ML BY MOUTH THREE TIMES DAILY AS NEEDED FOR MOUTH  PAIN   estradiol 0.5 MG tablet Commonly known as: ESTRACE Take 1 tablet (0.5 mg total) by mouth 2 (two) times daily.   famotidine 40 MG tablet Commonly known as: PEPCID Take 1 tablet (40 mg total) by mouth daily.   fluconazole 150 MG tablet Commonly known as: DIFLUCAN Take 1 tablet (150 mg total) by mouth once a week.   fluticasone 50 MCG/ACT nasal spray Commonly known as: FLONASE Place 2 sprays into both nostrils daily.   furosemide 20 MG tablet Commonly known as: LASIX TAKE 1 TABLET BY MOUTH THREE TIMES DAILY AS NEEDED FOR EDEMA   gabapentin 100 MG capsule Commonly known as: NEURONTIN TAKE 4 CAPSULES BY MOUTH AT BEDTIME   gabapentin 400 MG capsule Commonly known as: NEURONTIN Take 1 capsule (400 mg total) by mouth daily.   levothyroxine 100 MCG tablet Commonly known as: Euthyrox TAKE 1  TABLET BY MOUTH ONCE DAILY BEFORE BREAKFAST   magic mouthwash Soln Take 5 mLs by mouth 3 (three) times daily as needed for mouth pain. Components benadryl  525 mg, hydrocortisone 60 mg and nystatin 0.6 mg. 240 ml - Oral   meclizine 12.5 MG tablet Commonly known as: ANTIVERT TAKE 1 TABLET BY MOUTH THREE TIMES DAILY AS NEEDED FOR DIZZINESS   montelukast 10 MG tablet Commonly known as: SINGULAIR Take 1 tablet (10 mg total) by mouth at bedtime.   MULTIVITAMIN PO Take 1 tablet by mouth every morning.   nystatin 100000 UNIT/ML suspension Commonly  known as: MYCOSTATIN Take 5 mLs (500,000 Units total) by mouth 4 (four) times daily.   ondansetron 8 MG tablet Commonly known as: ZOFRAN TAKE 1/2 TO 1 TABLET BY MOUTH EVERY 8 HOURS AS NEEDED FOR NAUSEA AND VOMITING   optichamber diamond Misc USE as directed. Use with inhaler   oxyCODONE-acetaminophen 10-325 MG tablet Commonly known as: PERCOCET Take 1 tablet by mouth every 6 (six) hours as needed for pain.   Pegasys 180 MCG/ML injection Generic drug: peginterferon alfa-2a INJECT 0.25 MLS (45 MCG TOTAL) INTO THE SKIN EVERY 7 (SEVEN) DAYS. SINGLE USE VIALS, AFTER INJECTION DISPOSE OF REMAINING VIAL CONTENT.   Restasis 0.05 % ophthalmic emulsion Generic drug: cycloSPORINE PLACE ONE DROP INTO EACH EYE EVERY 12 HOURS   Sancuso 3.1 MG/24HR Generic drug: granisetron Apply to skin as needed for nausea.  Remove after 7 days.   SSD 1 % cream Generic drug: silver sulfADIAZINE APPLY TOPICALLY DAILY   traZODone 100 MG tablet Commonly known as: DESYREL Take 1 tablet (100 mg total) by mouth at bedtime.   TUBERCULIN SYR 1CC/27GX1/2" 27G X 1/2" 1 ML Misc Use as directed once weekly for peg-interferon injections   venlafaxine XR 75 MG 24 hr capsule Commonly known as: Effexor XR Take 1 capsule (75 mg total) by mouth 3 (three) times daily.       Allergies: No Known Allergies  Past Medical History, Surgical history, Social history, and Family History were reviewed and updated.  Review of Systems: All other 10 point review of systems is negative.   Physical Exam:  weight is 151 lb 8 oz (68.7 kg). Her tympanic temperature is 97.3 F (36.3 C) (abnormal). Her blood pressure is 128/84 and her pulse is 103 (abnormal). Her respiration is 18.   Wt Readings from Last 3 Encounters:  06/16/19 151 lb 8 oz (68.7 kg)  05/13/19 151 lb 3.2 oz (68.6 kg)  04/21/19 156 lb (70.8 kg)    Ocular: Sclerae unicteric, pupils equal, round and reactive to light Ear-nose-throat: Oropharynx clear,  dentition fair Lymphatic: No cervical or supraclavicular adenopathy Lungs no rales or rhonchi, good excursion bilaterally Heart regular rate and rhythm, no murmur appreciated Abd soft, nontender, positive bowel sounds, no liver or spleen tip palpated on exam, no fluid wave  MSK no focal spinal tenderness, no joint edema Neuro: non-focal, well-oriented, appropriate affect Breasts: Deferred   Lab Results  Component Value Date   WBC 4.8 06/16/2019   HGB 11.5 (L) 06/16/2019   HCT 36.1 06/16/2019   MCV 106.5 (H) 06/16/2019   PLT 579 (H) 06/16/2019   Lab Results  Component Value Date   FERRITIN 2,303 (H) 05/13/2019   IRON 90 05/13/2019   TIBC 178 (L) 05/13/2019   UIBC 88 (L) 05/13/2019   IRONPCTSAT 51 05/13/2019   Lab Results  Component Value Date   RETICCTPCT 1.0 06/16/2019   RBC 3.39 (L) 06/16/2019  RBC 3.33 (L) 06/16/2019   RETICCTABS 45.8 06/02/2015   No results found for: KPAFRELGTCHN, LAMBDASER, KAPLAMBRATIO No results found for: IGGSERUM, IGA, IGMSERUM No results found for: Kathrynn Ducking, MSPIKE, SPEI   Chemistry      Component Value Date/Time   NA 142 05/13/2019 1052   NA 143 07/16/2017 1057   NA 140 11/29/2016 0936   K 3.7 05/13/2019 1052   K 3.2 (L) 07/16/2017 1057   K 4.2 11/29/2016 0936   CL 104 05/13/2019 1052   CL 104 07/16/2017 1057   CO2 31 05/13/2019 1052   CO2 25 07/16/2017 1057   CO2 27 11/29/2016 0936   BUN 15 05/13/2019 1052   BUN 10 07/16/2017 1057   BUN 18.3 11/29/2016 0936   CREATININE 0.88 05/13/2019 1052   CREATININE 0.8 07/16/2017 1057   CREATININE 0.8 11/29/2016 0936      Component Value Date/Time   CALCIUM 9.0 05/13/2019 1052   CALCIUM 8.9 07/16/2017 1057   CALCIUM 8.9 11/29/2016 0936   ALKPHOS 86 05/13/2019 1052   ALKPHOS 71 07/16/2017 1057   ALKPHOS 77 11/29/2016 0936   AST 20 05/13/2019 1052   AST 19 11/29/2016 0936   ALT 14 05/13/2019 1052   ALT 27 07/16/2017 1057   ALT 10  11/29/2016 0936   BILITOT 0.2 (L) 05/13/2019 1052   BILITOT <0.22 11/29/2016 0936       Impression and Plan: Ms. Kempen is a very pleasant 68 yo caucasian female with essential thrombocythemia.  She continues to do well on Interferon and states that she is having her son administer her injection once a week on Fridays.  I went over her lab work with Dr. Marin Olp and she will continue her on her same dose.  We will plan to see her back in another month for follow-up.  She will contact our office with any questions or concerns. We can certainly see her sooner if needed.   Laverna Peace, NP 11/16/20202:44 PM

## 2019-06-17 LAB — FERRITIN: Ferritin: 2348 ng/mL — ABNORMAL HIGH (ref 11–307)

## 2019-06-17 LAB — IRON AND TIBC
Iron: 107 ug/dL (ref 41–142)
Saturation Ratios: 55 % (ref 21–57)
TIBC: 195 ug/dL — ABNORMAL LOW (ref 236–444)
UIBC: 88 ug/dL — ABNORMAL LOW (ref 120–384)

## 2019-06-17 LAB — TSH: TSH: <0.08 u[IU]/mL — ABNORMAL LOW (ref 0.308–3.960)

## 2019-06-19 ENCOUNTER — Encounter: Payer: Self-pay | Admitting: Family Medicine

## 2019-06-19 MED FILL — BD TB SYRINGE 27GX1/2": 27G X 1/2" | 28 days supply | Qty: 4 | Fill #9

## 2019-06-19 MED FILL — BD TB SYRINGE 27GX1/2: 27G X 1/2" | 28 days supply | Qty: 4 | Fill #9

## 2019-06-19 MED FILL — PEGASYS 180 MCG/ML VIAL: 180 | 28 days supply | Qty: 4 | Fill #2

## 2019-06-20 ENCOUNTER — Other Ambulatory Visit: Payer: Self-pay

## 2019-06-20 ENCOUNTER — Encounter: Payer: Self-pay | Admitting: Family Medicine

## 2019-06-21 ENCOUNTER — Encounter: Payer: Self-pay | Admitting: Family Medicine

## 2019-06-22 ENCOUNTER — Encounter: Payer: Self-pay | Admitting: Family Medicine

## 2019-06-23 ENCOUNTER — Other Ambulatory Visit: Payer: Self-pay

## 2019-06-23 ENCOUNTER — Encounter: Payer: Self-pay | Admitting: Family Medicine

## 2019-06-23 MED ORDER — ALPRAZOLAM 1 MG PO TABS: 1.0000 mg | ORAL_TABLET | Freq: Three times a day (TID) | ORAL | 0 refills | Status: DC | PRN

## 2019-06-23 NOTE — Telephone Encounter (Signed)
Can you sned this rx in for patient. I have wrote on there to fill 07/2019, due to last rx being 06/2019 and she is changing pharmacies.

## 2019-06-30 ENCOUNTER — Other Ambulatory Visit: Payer: Self-pay | Admitting: Family Medicine

## 2019-06-30 DIAGNOSIS — E559 Vitamin D deficiency, unspecified: Secondary | ICD-10-CM

## 2019-06-30 DIAGNOSIS — R739 Hyperglycemia, unspecified: Secondary | ICD-10-CM

## 2019-06-30 DIAGNOSIS — I10 Essential (primary) hypertension: Secondary | ICD-10-CM

## 2019-06-30 DIAGNOSIS — R300 Dysuria: Secondary | ICD-10-CM

## 2019-07-02 ENCOUNTER — Other Ambulatory Visit: Payer: Self-pay | Admitting: Family Medicine

## 2019-07-06 ENCOUNTER — Encounter: Payer: Self-pay | Admitting: Family Medicine

## 2019-07-07 ENCOUNTER — Encounter: Payer: Self-pay | Admitting: Family Medicine

## 2019-07-08 ENCOUNTER — Other Ambulatory Visit: Payer: Self-pay | Admitting: Family Medicine

## 2019-07-08 ENCOUNTER — Other Ambulatory Visit: Payer: Self-pay

## 2019-07-08 DIAGNOSIS — G8929 Other chronic pain: Secondary | ICD-10-CM

## 2019-07-08 MED ORDER — OXYCODONE-ACETAMINOPHEN 10-325 MG PO TABS: 1.0000 | ORAL_TABLET | Freq: Four times a day (QID) | ORAL | 0 refills | Status: DC | PRN

## 2019-07-08 NOTE — Telephone Encounter (Signed)
See previous mychart message

## 2019-07-08 NOTE — Telephone Encounter (Signed)
Requesting:oxycodone  Contract:yes UDS: low risk next screen 10/13/19 Last OV:04/21/19 Next OV: 08/14/19 Last Refill:06/09/19  #[id number] Database:   Please advise

## 2019-07-15 ENCOUNTER — Other Ambulatory Visit: Payer: Self-pay | Admitting: Family Medicine

## 2019-07-15 ENCOUNTER — Other Ambulatory Visit: Payer: Self-pay | Admitting: Hematology & Oncology

## 2019-07-15 DIAGNOSIS — D500 Iron deficiency anemia secondary to blood loss (chronic): Secondary | ICD-10-CM

## 2019-07-16 MED FILL — BD TB SYRINGE 27GX1/2: 27G X 1/2" | 28 days supply | Qty: 4 | Fill #10

## 2019-07-16 MED FILL — PEGASYS 180 MCG/ML VIAL: 180 | 28 days supply | Qty: 4 | Fill #3

## 2019-07-16 MED FILL — BD TB SYRINGE 27GX1/2": 27G X 1/2" | 28 days supply | Qty: 4 | Fill #10

## 2019-07-17 ENCOUNTER — Other Ambulatory Visit: Payer: Self-pay | Admitting: Family Medicine

## 2019-07-17 NOTE — Telephone Encounter (Signed)
Last OV 04/21/19 Last refill - not on current med list Next OV 08/14/19

## 2019-07-21 ENCOUNTER — Encounter: Payer: Self-pay | Admitting: *Deleted

## 2019-07-21 ENCOUNTER — Inpatient Hospital Stay: Payer: Medicare Other | Attending: Hematology & Oncology

## 2019-07-21 ENCOUNTER — Encounter: Payer: Self-pay | Admitting: Hematology & Oncology

## 2019-07-21 ENCOUNTER — Inpatient Hospital Stay (HOSPITAL_BASED_OUTPATIENT_CLINIC_OR_DEPARTMENT_OTHER): Payer: Medicare Other | Admitting: Hematology & Oncology

## 2019-07-21 DIAGNOSIS — R110 Nausea: Secondary | ICD-10-CM | POA: Diagnosis not present

## 2019-07-21 DIAGNOSIS — R0602 Shortness of breath: Secondary | ICD-10-CM | POA: Diagnosis not present

## 2019-07-21 DIAGNOSIS — R42 Dizziness and giddiness: Secondary | ICD-10-CM | POA: Diagnosis not present

## 2019-07-21 DIAGNOSIS — R109 Unspecified abdominal pain: Secondary | ICD-10-CM | POA: Diagnosis not present

## 2019-07-21 DIAGNOSIS — Z634 Disappearance and death of family member: Secondary | ICD-10-CM | POA: Diagnosis not present

## 2019-07-21 DIAGNOSIS — Z79899 Other long term (current) drug therapy: Secondary | ICD-10-CM | POA: Diagnosis not present

## 2019-07-21 DIAGNOSIS — M791 Myalgia, unspecified site: Secondary | ICD-10-CM | POA: Insufficient documentation

## 2019-07-21 DIAGNOSIS — D500 Iron deficiency anemia secondary to blood loss (chronic): Secondary | ICD-10-CM

## 2019-07-21 DIAGNOSIS — R5383 Other fatigue: Secondary | ICD-10-CM | POA: Insufficient documentation

## 2019-07-21 DIAGNOSIS — R12 Heartburn: Secondary | ICD-10-CM | POA: Insufficient documentation

## 2019-07-21 DIAGNOSIS — D509 Iron deficiency anemia, unspecified: Secondary | ICD-10-CM | POA: Insufficient documentation

## 2019-07-21 DIAGNOSIS — D473 Essential (hemorrhagic) thrombocythemia: Secondary | ICD-10-CM | POA: Diagnosis present

## 2019-07-21 DIAGNOSIS — E039 Hypothyroidism, unspecified: Secondary | ICD-10-CM

## 2019-07-21 DIAGNOSIS — R61 Generalized hyperhidrosis: Secondary | ICD-10-CM | POA: Diagnosis not present

## 2019-07-21 DIAGNOSIS — Z7982 Long term (current) use of aspirin: Secondary | ICD-10-CM | POA: Diagnosis not present

## 2019-07-21 DIAGNOSIS — D7581 Myelofibrosis: Secondary | ICD-10-CM

## 2019-07-21 DIAGNOSIS — R519 Headache, unspecified: Secondary | ICD-10-CM | POA: Diagnosis not present

## 2019-07-21 DIAGNOSIS — M2550 Pain in unspecified joint: Secondary | ICD-10-CM | POA: Diagnosis not present

## 2019-07-21 LAB — CMP (CANCER CENTER ONLY)
ALT: 20 U/L (ref 0–44)
AST: 22 U/L (ref 15–41)
Albumin: 3.9 g/dL (ref 3.5–5.0)
Alkaline Phosphatase: 81 U/L (ref 38–126)
Anion gap: 7 (ref 5–15)
BUN: 20 mg/dL (ref 8–23)
CO2: 30 mmol/L (ref 22–32)
Calcium: 8.8 mg/dL — ABNORMAL LOW (ref 8.9–10.3)
Chloride: 104 mmol/L (ref 98–111)
Creatinine: 1.2 mg/dL — ABNORMAL HIGH (ref 0.44–1.00)
GFR, Est AFR Am: 54 mL/min — ABNORMAL LOW (ref >60)
GFR, Estimated: 47 mL/min — ABNORMAL LOW (ref >60)
Glucose, Bld: 94 mg/dL (ref 70–99)
Potassium: 3.8 mmol/L (ref 3.5–5.1)
Sodium: 141 mmol/L (ref 135–145)
Total Bilirubin: 0.2 mg/dL — ABNORMAL LOW (ref 0.3–1.2)
Total Protein: 7.1 g/dL (ref 6.5–8.1)

## 2019-07-21 LAB — FERRITIN: Ferritin: 1631 ng/mL — ABNORMAL HIGH (ref 11–307)

## 2019-07-21 LAB — TSH: TSH: 0.201 u[IU]/mL — ABNORMAL LOW (ref 0.308–3.960)

## 2019-07-21 LAB — CBC WITH DIFFERENTIAL (CANCER CENTER ONLY)
Abs Immature Granulocytes: 0.05 10*3/uL (ref 0.00–0.07)
Basophils Absolute: 0.1 10*3/uL (ref 0.0–0.1)
Basophils Relative: 1 %
Eosinophils Absolute: 0.3 10*3/uL (ref 0.0–0.5)
Eosinophils Relative: 5 %
HCT: 33 % — ABNORMAL LOW (ref 36.0–46.0)
Hemoglobin: 10.5 g/dL — ABNORMAL LOW (ref 12.0–15.0)
Immature Granulocytes: 1 %
Lymphocytes Relative: 23 %
Lymphs Abs: 1.2 10*3/uL (ref 0.7–4.0)
MCH: 34.1 pg — ABNORMAL HIGH (ref 26.0–34.0)
MCHC: 31.8 g/dL (ref 30.0–36.0)
MCV: 107.1 fL — ABNORMAL HIGH (ref 80.0–100.0)
Monocytes Absolute: 0.6 10*3/uL (ref 0.1–1.0)
Monocytes Relative: 10 %
Neutro Abs: 3.2 10*3/uL (ref 1.7–7.7)
Neutrophils Relative %: 60 %
Platelet Count: 602 10*3/uL — ABNORMAL HIGH (ref 150–400)
RBC: 3.08 MIL/uL — ABNORMAL LOW (ref 3.87–5.11)
RDW: 14.1 % (ref 11.5–15.5)
WBC Count: 5.3 10*3/uL (ref 4.0–10.5)
nRBC: 0 % (ref 0.0–0.2)

## 2019-07-21 LAB — IRON AND TIBC
Iron: 89 ug/dL (ref 41–142)
Saturation Ratios: 47 % (ref 21–57)
TIBC: 191 ug/dL — ABNORMAL LOW (ref 236–444)
UIBC: 102 ug/dL — ABNORMAL LOW (ref 120–384)

## 2019-07-21 LAB — RETIC PANEL
Immature Retic Fract: 15.3 % (ref 2.3–15.9)
RBC.: 3.1 MIL/uL — ABNORMAL LOW (ref 3.87–5.11)
Retic Count, Absolute: 53.3 10*3/uL (ref 19.0–186.0)
Retic Ct Pct: 1.7 % (ref 0.4–3.1)
Reticulocyte Hemoglobin: 37.2 pg (ref >27.9)

## 2019-07-21 LAB — SAVE SMEAR(SSMR), FOR PROVIDER SLIDE REVIEW

## 2019-07-21 MED ORDER — PEGASYS 180 MCG/ML ~~LOC~~ SOLN: 90.0000 ug | SUBCUTANEOUS | 6 refills | Status: DC

## 2019-07-21 NOTE — Progress Notes (Signed)
Hematology and Oncology Follow Up Visit  MICAILA WEINSTOCK [id number] 01-Dec-1950 68 y.o. 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 Bronkhorst is back for follow-up.  She is having more headaches.  Because of this, she knows that her platelet count is up.  As always, she is correct.  Her platelet count continues to trend upward.  We are going to have to make a change of the interferon.  I will go ahead and put her on 90 mcg.  Hopefully increasing the inferior dose will slow down her thrombopoiesis.  She does feel tired.  She says that she does not have as much energy.  Her iron studies have been good.  Back in 07/06/23, her ferritin was 2350 with an iron saturation of 55%.  She still mourns the loss of her husband.  Is now been 8-1/2 years.  It has been very difficult for her.  She has had no problems with cough.  There is no fever.  She has had no obvious change in bowel or bladder habits.  Overall, I would say her performance status is ECOG 1.     Medications:  Allergies as of 07/21/2019   No Known Allergies     Medication List       Accurate as of July 21, 2019  8:16 AM. If you have any questions, ask your nurse or doctor.        albuterol 108 (90 Base) MCG/ACT inhaler Commonly known as: VENTOLIN HFA INHALE 2 PUFFS BY MOUTH EVERY 6 HOURS AS NEEDED FOR WHEEZING OR SHORTNESS OF BREATH   Aller-Chlor 4 MG tablet Generic drug: chlorpheniramine TAKE 2 TABLETS BY MOUTH EVERY NIGHT AT BEDTIME AS NEEDED FOR ALLERGIES   ALPRAZolam 1 MG tablet Commonly known as:  XANAX Take 1 tablet (1 mg total) by mouth 3 (three) times daily as needed for anxiety.   atorvastatin 10 MG tablet Commonly known as: LIPITOR Take 1 tablet (10 mg total) by mouth daily.   budesonide-formoterol 80-4.5 MCG/ACT inhaler Commonly known as: Symbicort Inhale 2 puffs into the lungs 2 (two) times daily.   busPIRone 15 MG tablet Commonly known as: BUSPAR TAKE 1 TABLET BY MOUTH FOUR TIMES DAILY AS NEEDED   Cranberry 300 MG tablet Take 300 mg by mouth 2 (two) times daily.   cyclobenzaprine 10 MG tablet Commonly known as: FLEXERIL TAKE 1 TABLET BY MOUTH TWICE DAILY AS NEEDED FOR MUSCLE SPASM   EQ Allergy Relief Childrens 12.5 MG/5ML liquid Generic drug: diphenhydrAMINE TAKE 5 ML BY MOUTH THREE TIMES DAILY AS NEEDED FOR MOUTH  PAIN   estradiol 0.5 MG tablet Commonly known as: ESTRACE Take 1 tablet (0.5 mg total) by mouth 2 (two) times daily.   famotidine 40 MG tablet Commonly known as: PEPCID Take 1 tablet (40 mg total) by mouth daily. What changed: Another medication with the same name was removed. Continue taking this medication, and follow the directions you see here. Changed by: [name], MD   fluconazole 150 MG tablet Commonly known as: DIFLUCAN TAKE 1 TABLET BY MOUTH ONCE WEEKLY  fluticasone 50 MCG/ACT nasal spray Commonly known as: FLONASE Place 2 sprays into both nostrils daily.   furosemide 20 MG tablet Commonly known as: LASIX TAKE 1 TABLET BY MOUTH THREE TIMES DAILY AS NEEDED FOR EDEMA   gabapentin 100 MG capsule Commonly known as: NEURONTIN TAKE 4 CAPSULES BY MOUTH AT BEDTIME   gabapentin 400 MG capsule Commonly known as: NEURONTIN Take 1 capsule (400 mg total) by mouth daily.   levothyroxine 100 MCG tablet Commonly known as: Euthyrox TAKE 1 TABLET BY MOUTH ONCE DAILY BEFORE BREAKFAST   magic mouthwash Soln Take 5 mLs by mouth 3 (three) times daily as needed for mouth pain. Components benadryl  525 mg, hydrocortisone 60 mg and  nystatin 0.6 mg. 240 ml - Oral   meclizine 12.5 MG tablet Commonly known as: ANTIVERT TAKE 1 TABLET BY MOUTH 3 TIMES DAILY AS NEEDED FOR DIZZINESS   montelukast 10 MG tablet Commonly known as: SINGULAIR Take 1 tablet (10 mg total) by mouth at bedtime.   MULTIVITAMIN PO Take 1 tablet by mouth every morning.   nystatin 100000 UNIT/ML suspension Commonly known as: MYCOSTATIN Take 5 mLs (500,000 Units total) by mouth 4 (four) times daily.   ondansetron 8 MG tablet Commonly known as: ZOFRAN TAKE 1/2 TO 1 TABLET BY MOUTH EVERY 8 HOURS AS NEEDED FOR NAUSEA AND VOMITING   optichamber diamond Misc USE as directed. Use with inhaler   oxyCODONE-acetaminophen 10-325 MG tablet Commonly known as: PERCOCET Take 1 tablet by mouth every 6 (six) hours as needed for pain.   Pegasys 180 MCG/ML injection Generic drug: peginterferon alfa-2a INJECT 0.25 MLS (45 MCG TOTAL) INTO THE SKIN EVERY 7 (SEVEN) DAYS. SINGLE USE VIALS, AFTER INJECTION DISPOSE OF REMAINING VIAL CONTENT.   Restasis 0.05 % ophthalmic emulsion Generic drug: cycloSPORINE PLACE ONE DROP INTO EACH EYE EVERY 12 HOURS   Sancuso 3.1 MG/24HR Generic drug: granisetron APPLY 1 PATCH TOPICALLY AS NEEDED FOR NAUSEA, REMOVE AFTER 7 DAYS   SSD 1 % cream Generic drug: silver sulfADIAZINE APPLY TOPICALLY DAILY   traZODone 100 MG tablet Commonly known as: DESYREL Take 1 tablet (100 mg total) by mouth at bedtime.   TUBERCULIN SYR 1CC/27GX1/2" 27G X 1/2" 1 ML Misc Use as directed once weekly for peg-interferon injections   venlafaxine XR 75 MG 24 hr capsule Commonly known as: Effexor XR Take 1 capsule (75 mg total) by mouth 3 (three) times daily.       Allergies: No Known Allergies  Past Medical History, Surgical history, Social history, and Family History were reviewed and updated.  Review of Systems: Review of Systems  Constitutional: Positive for diaphoresis and malaise/fatigue.  HENT: Negative.   Eyes: Negative.     Respiratory: Positive for shortness of breath.   Cardiovascular: Negative.   Gastrointestinal: Positive for abdominal pain, heartburn and nausea.  Genitourinary: Negative.   Musculoskeletal: Positive for joint pain and myalgias.  Skin: Negative.   Neurological: Positive for dizziness.  Endo/Heme/Allergies: Negative.   Psychiatric/Behavioral: Negative.      Physical Exam:  weight is 152 lb (68.9 kg). Her temporal temperature is 97.5 F (36.4 C) (abnormal). Her blood pressure is 128/72 and her pulse is 82. Her respiration is 18 and oxygen saturation is 100%.   Wt Readings from Last 3 Encounters:  07/21/19 152 lb (68.9 kg)  06/16/19 151 lb 8 oz (68.7 kg)  05/13/19 151 lb 3.2 oz (68.6 kg)    Physical Exam Vitals reviewed.  HENT:     Head: Normocephalic and  atraumatic.  Eyes:     Pupils: Pupils are equal, round, and reactive to light.  Cardiovascular:     Rate and Rhythm: Normal rate and regular rhythm.     Heart sounds: Normal heart sounds.  Pulmonary:     Effort: Pulmonary effort is normal.     Breath sounds: Normal breath sounds.  Abdominal:     General: Bowel sounds are normal.     Palpations: Abdomen is soft.     Comments: Her spleen tip is palpable a couple centimeters below the left costal margin.  Musculoskeletal:        General: No tenderness or deformity. Normal range of motion.     Cervical back: Normal range of motion.  Lymphadenopathy:     Cervical: No cervical adenopathy.  Skin:    General: Skin is warm and dry.     Findings: No erythema or rash.  Neurological:     Mental Status: She is alert and oriented to person, place, and time.  Psychiatric:        Behavior: Behavior normal.        Thought Content: Thought content normal.        Judgment: Judgment normal.      Lab Results  Component Value Date   WBC 5.3 07/21/2019   HGB 10.5 (L) 07/21/2019   HCT 33.0 (L) 07/21/2019   MCV 107.1 (H) 07/21/2019   PLT 602 (H) 07/21/2019   Lab Results   Component Value Date   FERRITIN 2,348 (H) 06/16/2019   IRON 107 06/16/2019   TIBC 195 (L) 06/16/2019   UIBC 88 (L) 06/16/2019   IRONPCTSAT 55 06/16/2019   Lab Results  Component Value Date   RETICCTPCT 1.7 07/21/2019   RBC 3.10 (L) 07/21/2019   RETICCTABS 45.8 06/02/2015   No results found for: KPAFRELGTCHN, LAMBDASER, KAPLAMBRATIO No results found for: IGGSERUM, IGA, IGMSERUM No results found for: Odetta Pink, SPEI   Chemistry      Component Value Date/Time   NA 140 06/16/2019 1415   NA 143 07/16/2017 1057   NA 140 11/29/2016 0936   K 3.9 06/16/2019 1415   K 3.2 (L) 07/16/2017 1057   K 4.2 11/29/2016 0936   CL 102 06/16/2019 1415   CL 104 07/16/2017 1057   CO2 30 06/16/2019 1415   CO2 25 07/16/2017 1057   CO2 27 11/29/2016 0936   BUN 15 06/16/2019 1415   BUN 10 07/16/2017 1057   BUN 18.3 11/29/2016 0936   CREATININE 1.02 (H) 06/16/2019 1415   CREATININE 0.8 07/16/2017 1057   CREATININE 0.8 11/29/2016 0936      Component Value Date/Time   CALCIUM 9.4 06/16/2019 1415   CALCIUM 8.9 07/16/2017 1057   CALCIUM 8.9 11/29/2016 0936   ALKPHOS 92 06/16/2019 1415   ALKPHOS 71 07/16/2017 1057   ALKPHOS 77 11/29/2016 0936   AST 31 06/16/2019 1415   AST 19 11/29/2016 0936   ALT 21 06/16/2019 1415   ALT 27 07/16/2017 1057   ALT 10 11/29/2016 0936   BILITOT 0.5 06/16/2019 1415   BILITOT <0.22 11/29/2016 0936      Impression and Plan: Ms. Coon is a very pleasant 68 yo caucasian female with essential thrombocythemia.   We will make dose of interferon now.  Hopefully, this will slow her platelet platelet production up a little bit.  I know that she is doing her best.  I know that she is trying hard.  Maybe, at  the recent [facility] 2020 Meeting, there will be some new information regarding thrombocythemia.  I would like to see her back in about 1 month.   [name], MD 12/21/20208:16 AM

## 2019-07-24 ENCOUNTER — Other Ambulatory Visit: Payer: Self-pay | Admitting: Family Medicine

## 2019-07-24 DIAGNOSIS — I10 Essential (primary) hypertension: Secondary | ICD-10-CM

## 2019-07-24 DIAGNOSIS — E559 Vitamin D deficiency, unspecified: Secondary | ICD-10-CM

## 2019-07-24 DIAGNOSIS — R739 Hyperglycemia, unspecified: Secondary | ICD-10-CM

## 2019-07-24 DIAGNOSIS — R300 Dysuria: Secondary | ICD-10-CM

## 2019-07-29 ENCOUNTER — Telehealth: Payer: Self-pay | Admitting: Family Medicine

## 2019-07-29 NOTE — Telephone Encounter (Signed)
Copied from [place] [id number]. Topic: General - Other >> Jul 28, 2019  4:14 PM Celene Kras wrote: Reason for CRM: Ovid Curd, from [employer], called stating he sent over a fax for cardiovascular health assessment for the pt.  Ovid Curd states he will send it again today. Please advise.    Callback # [phone] Fax # [phone]

## 2019-07-29 NOTE — Telephone Encounter (Signed)
Once I receive the forms will pass to PCP

## 2019-08-07 ENCOUNTER — Encounter: Payer: Self-pay | Admitting: Family Medicine

## 2019-08-07 ENCOUNTER — Other Ambulatory Visit: Payer: Self-pay

## 2019-08-07 DIAGNOSIS — G8929 Other chronic pain: Secondary | ICD-10-CM

## 2019-08-07 MED ORDER — OXYCODONE-ACETAMINOPHEN 10-325 MG PO TABS: 1.0000 | ORAL_TABLET | Freq: Four times a day (QID) | ORAL | 0 refills | Status: DC | PRN

## 2019-08-07 NOTE — Telephone Encounter (Signed)
Requesting:Percocet Contract:yes [id number] risk next screen 10/13/19 Last OV:04/21/19 Next OV:08/22/19 Last Refill:07/08/19  #[id number] Database:   Please advise

## 2019-08-11 MED ORDER — OXYCODONE-ACETAMINOPHEN 10-325 MG PO TABS: 1.0000 | ORAL_TABLET | Freq: Four times a day (QID) | ORAL | 0 refills | Status: DC | PRN

## 2019-08-11 NOTE — Addendum Note (Signed)
Addended by: Magdalene Molly A on: 08/11/2019 11:45 AM   Modules accepted: Orders

## 2019-08-11 NOTE — Telephone Encounter (Signed)
I have canceled the other rx sent in

## 2019-08-11 NOTE — Telephone Encounter (Signed)
Please resend Percocet to [facility] in [place], medication was sent to mail order pharmacy

## 2019-08-12 ENCOUNTER — Telehealth: Payer: Self-pay | Admitting: Pharmacy Technician

## 2019-08-12 MED FILL — BD TB SYRINGE 27GX1/2": 27G X 1/2" | 28 days supply | Qty: 4 | Fill #11

## 2019-08-12 MED FILL — BD TB SYRINGE 27GX1/2: 27G X 1/2" | 28 days supply | Qty: 4 | Fill #11

## 2019-08-12 MED FILL — PEGASYS 180 MCG/ML VIAL: 180 | 28 days supply | Qty: 4 | Fill #4

## 2019-08-12 NOTE — Telephone Encounter (Signed)
Oral Oncology Patient Advocate Encounter   Was successful in securing patient an $8,500 grant from [employer] ([employer] to provide copayment coverage for Pegasys.  This will keep the out of pocket expense at $0.     I have spoken with the patient.    The billing information is as follows and has been shared with [facility].   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     

## 2019-08-13 IMAGING — CT CT ABD-PELV W/O CM
2 of 4 series · 16 of 46 positions shown, 18 images · non-contrast
Comparison: 03/12/2017, 07/18/2015

CLINICAL DATA: Back and shoulder pain

EXAM:
CT ABDOMEN AND PELVIS WITHOUT CONTRAST
TECHNIQUE: Multidetector CT imaging of the abdomen and pelvis was performed
following the standard protocol without IV contrast.

[Series 3: ap without · axial · non-contrast · 0.66mm/px · z∈[+738,+1143]mm · 13 of 93 slices shown, 15 images]
[im 6/93  soft-tissue]
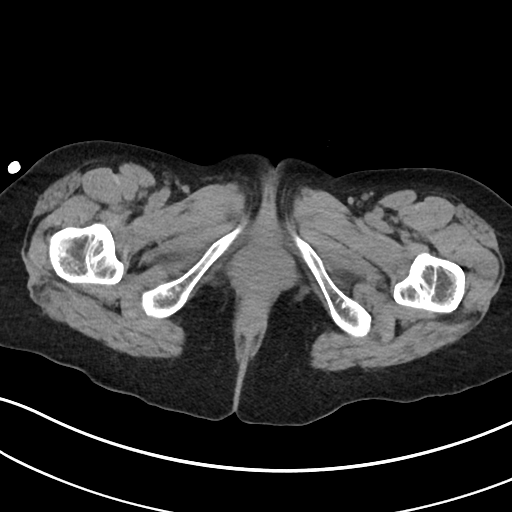
[im 6/93  bone]
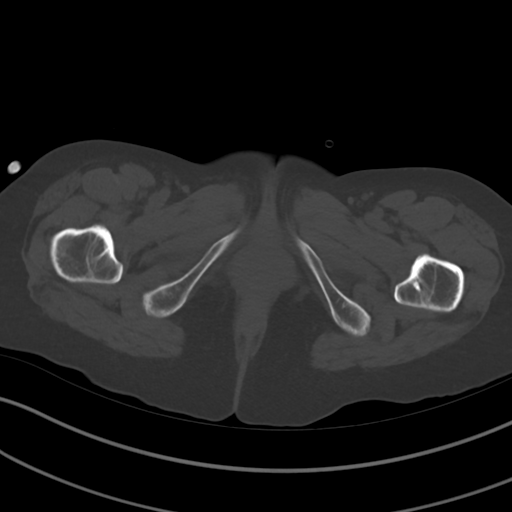
[im 11/93  soft-tissue]
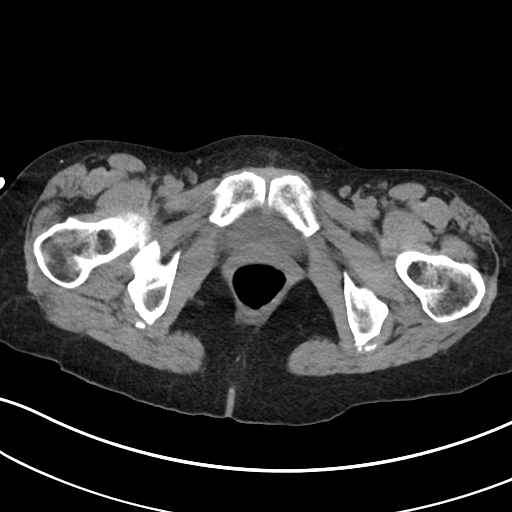
[im 22/93  soft-tissue]
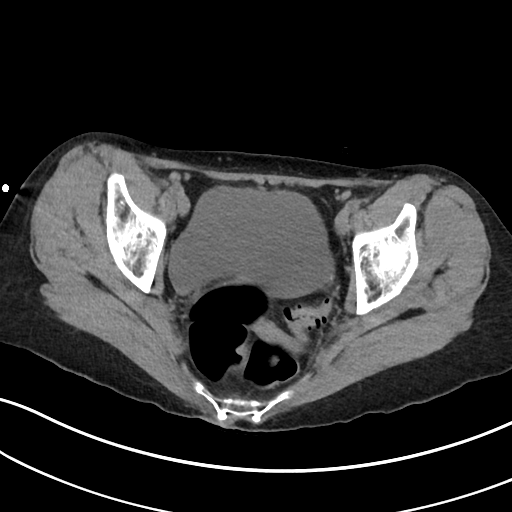
[im 28/93  soft-tissue]
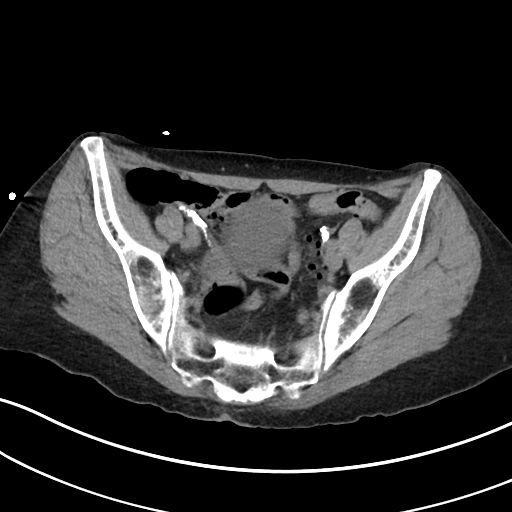
[im 33/93  soft-tissue]
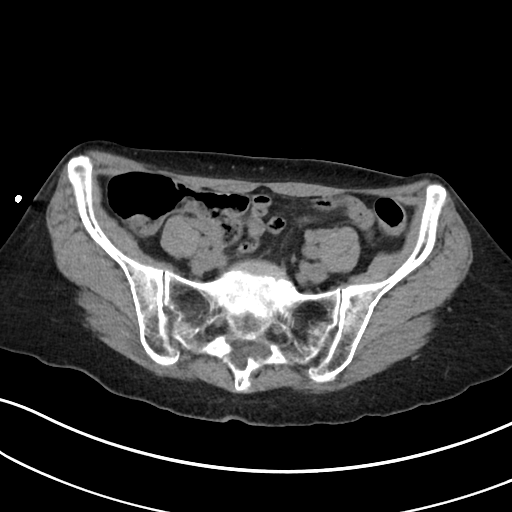
[im 38/93  soft-tissue]
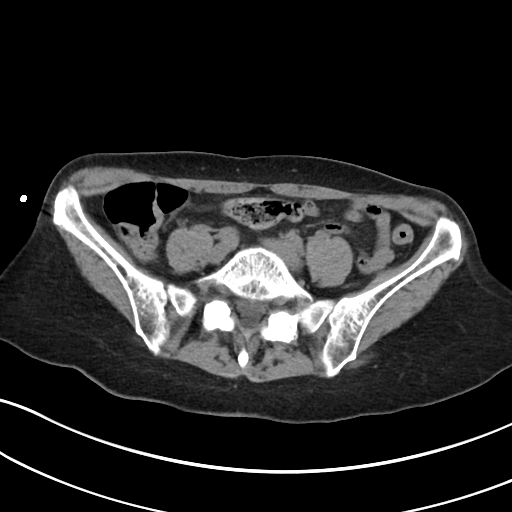
[im 49/93  soft-tissue]
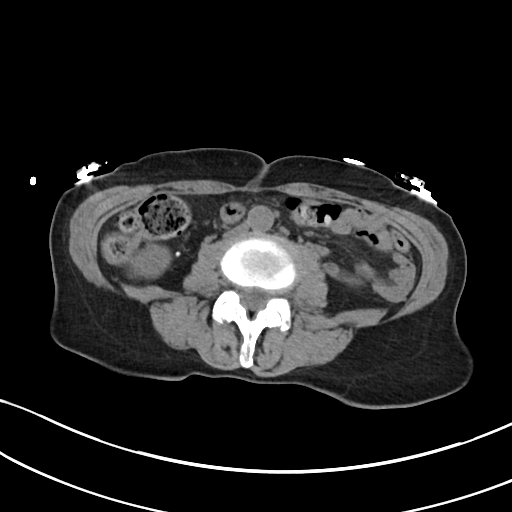
[im 55/93  soft-tissue]
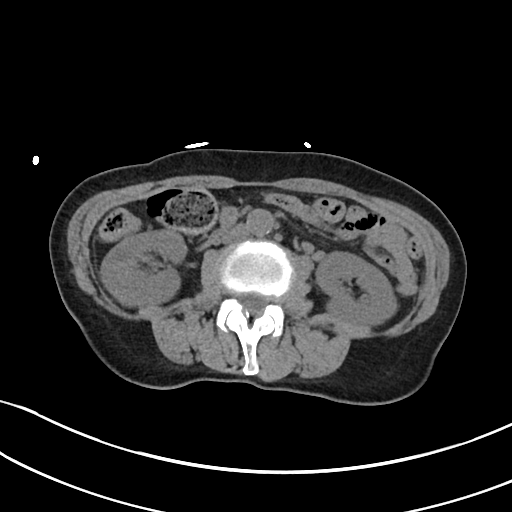
[im 60/93  soft-tissue]
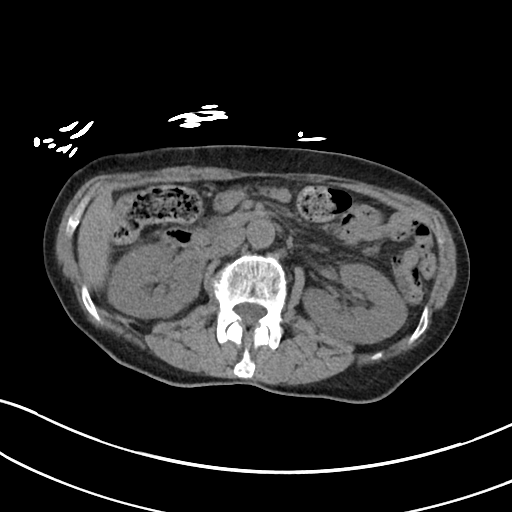
[im 60/93  bone]
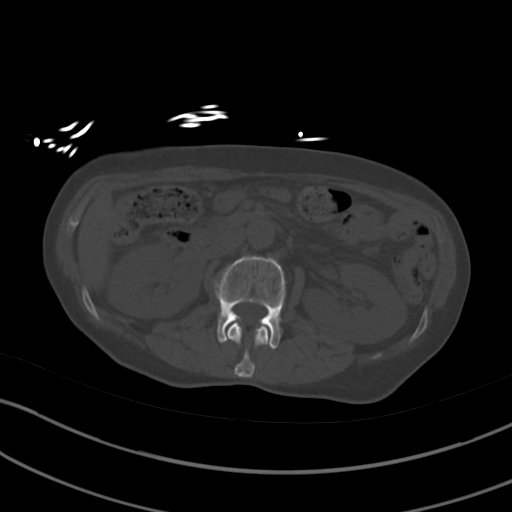
[im 65/93  soft-tissue]
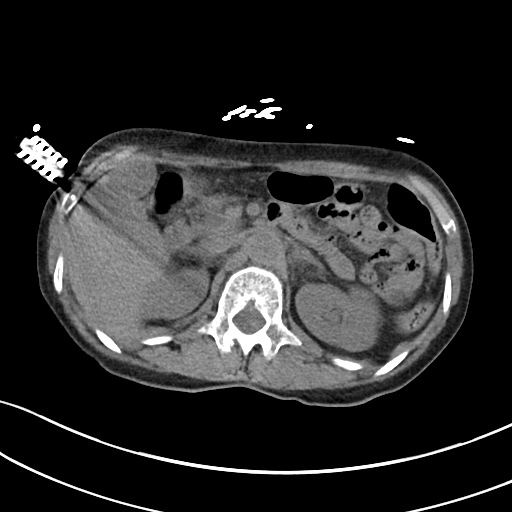
[im 71/93  soft-tissue]
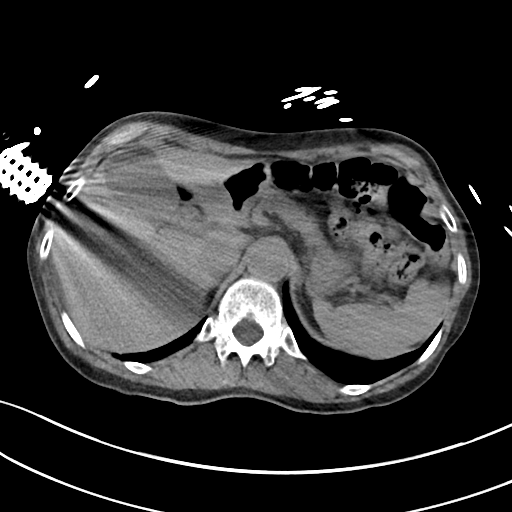
[im 82/93  soft-tissue]
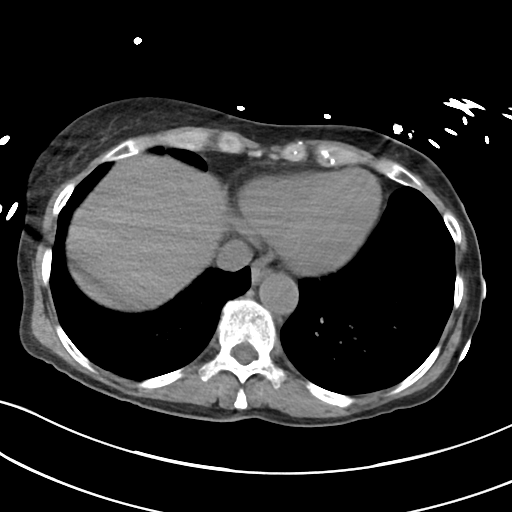
[im 87/93  soft-tissue]
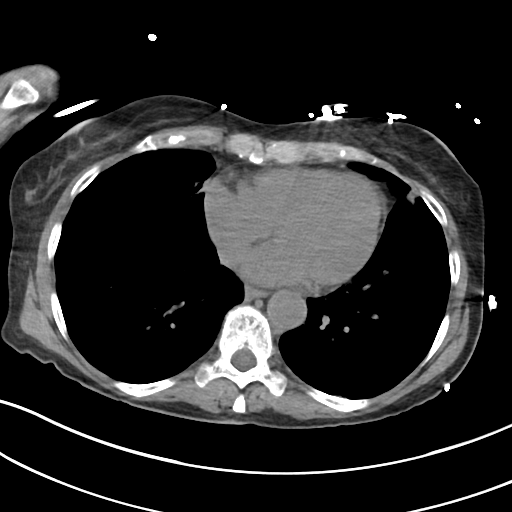

[Series 6: cor · coronal · 0.67mm/px · 3 of 67 slices shown]
[im 23/67  soft-tissue]
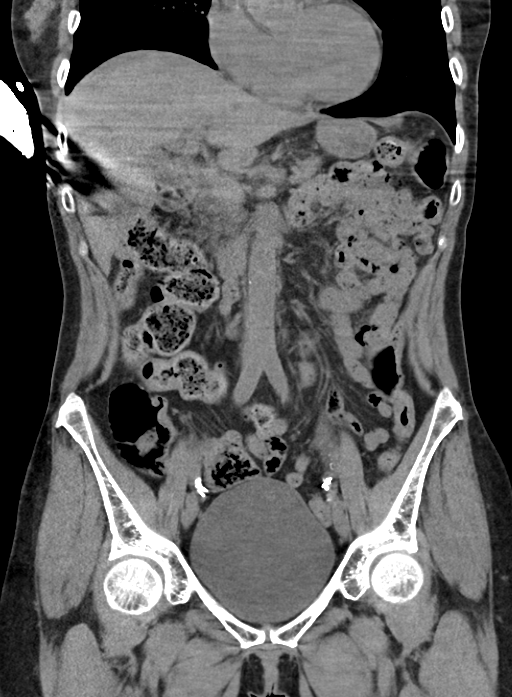
[im 30/67  soft-tissue]
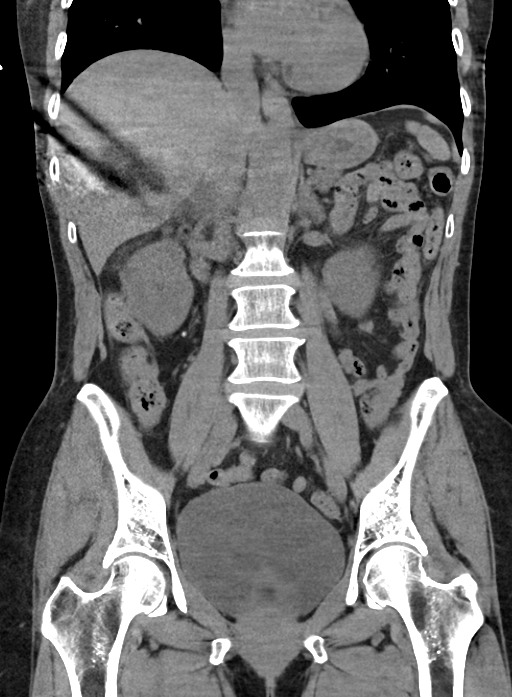
[im 37/67  soft-tissue]
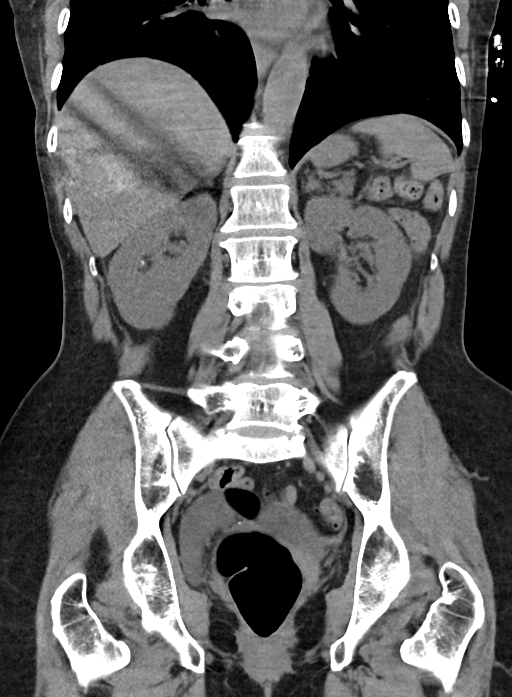

[16 of 46 positions shown; findings below may reference images not displayed]

FINDINGS: Lower chest: No consolidation or pleural effusion is seen. Mild
bronchiectasis and scarring in the right middle lobe and lingula.
Mild right lower lobe subpleural interstitial density and slight
nodularity. Heart size within normal limits.

Hepatobiliary: Liver partially obscured by artifact. No calcified
gallstones or biliary dilatation

Pancreas: Unremarkable. No pancreatic ductal dilatation or
surrounding inflammatory changes.

Spleen: Normal in size without focal abnormality.

Adrenals/Urinary Tract: Adrenal glands are within normal limits.
Punctate nonobstructing stone in the mid right kidney. Punctate
nonobstructing stone in the lower pole left kidney on coronal views.
1.2 cm cyst mid right kidney. Negative for hydronephrosis or
ureteral stone. Bladder within normal limits.

Stomach/Bowel: Stomach is within normal limits. Appendix appears
normal. No evidence of bowel wall thickening, distention, or
inflammatory changes. Scattered sigmoid colon diverticula without
acute inflammation

Vascular/Lymphatic: No significant vascular findings are present. No
enlarged abdominal or pelvic lymph nodes.

Reproductive: Status post hysterectomy. No adnexal masses.

Other: Negative for free air or free fluid

Musculoskeletal: No acute or suspicious bone lesion
IMPRESSION: 1. Punctate nonobstructing stones in the kidneys. Negative for
hydronephrosis or ureteral stone.
2. Scattered sigmoid colon diverticula without acute inflammation
3. Mild right lower lobe subpleural interstitial density and
nodularity, could reflect small airways infection or possible
atypical pneumonia.

## 2019-08-14 ENCOUNTER — Ambulatory Visit: Payer: Medicare Other | Admitting: Family Medicine

## 2019-08-14 ENCOUNTER — Telehealth: Payer: Self-pay | Admitting: Family Medicine

## 2019-08-14 NOTE — Telephone Encounter (Signed)
Copied from [place] [id number]. Topic: General - Inquiry >> Aug 14, 2019  9:11 AM Virl Axe D wrote: Reason for CRM: Ovid Curd with [employer] is faxing over a Cardiovascular Health Assessment Form this morning and is requesting it to be filled out and faxed back. Please advise.

## 2019-08-14 NOTE — Telephone Encounter (Signed)
Will complete and send to pcp for review

## 2019-08-15 ENCOUNTER — Other Ambulatory Visit: Payer: Self-pay | Admitting: Family Medicine

## 2019-08-19 ENCOUNTER — Encounter: Payer: Self-pay | Admitting: Hematology & Oncology

## 2019-08-19 ENCOUNTER — Inpatient Hospital Stay (HOSPITAL_BASED_OUTPATIENT_CLINIC_OR_DEPARTMENT_OTHER): Payer: Medicare Other | Admitting: Hematology & Oncology

## 2019-08-19 ENCOUNTER — Inpatient Hospital Stay: Payer: Medicare Other | Attending: Hematology & Oncology

## 2019-08-19 ENCOUNTER — Other Ambulatory Visit: Payer: Self-pay

## 2019-08-19 VITALS — BP 132/89 | HR 100 | Temp 98.4°F | Resp 18 | Ht 63.5 in | Wt 154.0 lb

## 2019-08-19 DIAGNOSIS — R109 Unspecified abdominal pain: Secondary | ICD-10-CM | POA: Diagnosis not present

## 2019-08-19 DIAGNOSIS — D509 Iron deficiency anemia, unspecified: Secondary | ICD-10-CM | POA: Insufficient documentation

## 2019-08-19 DIAGNOSIS — R0602 Shortness of breath: Secondary | ICD-10-CM | POA: Diagnosis not present

## 2019-08-19 DIAGNOSIS — Z79899 Other long term (current) drug therapy: Secondary | ICD-10-CM | POA: Diagnosis not present

## 2019-08-19 DIAGNOSIS — Z7982 Long term (current) use of aspirin: Secondary | ICD-10-CM | POA: Insufficient documentation

## 2019-08-19 DIAGNOSIS — R110 Nausea: Secondary | ICD-10-CM | POA: Insufficient documentation

## 2019-08-19 DIAGNOSIS — R42 Dizziness and giddiness: Secondary | ICD-10-CM | POA: Insufficient documentation

## 2019-08-19 DIAGNOSIS — R12 Heartburn: Secondary | ICD-10-CM | POA: Diagnosis not present

## 2019-08-19 DIAGNOSIS — D473 Essential (hemorrhagic) thrombocythemia: Secondary | ICD-10-CM

## 2019-08-19 DIAGNOSIS — M791 Myalgia, unspecified site: Secondary | ICD-10-CM | POA: Diagnosis not present

## 2019-08-19 DIAGNOSIS — D7581 Myelofibrosis: Secondary | ICD-10-CM | POA: Diagnosis not present

## 2019-08-19 DIAGNOSIS — R61 Generalized hyperhidrosis: Secondary | ICD-10-CM | POA: Insufficient documentation

## 2019-08-19 DIAGNOSIS — R5383 Other fatigue: Secondary | ICD-10-CM | POA: Diagnosis not present

## 2019-08-19 DIAGNOSIS — M2550 Pain in unspecified joint: Secondary | ICD-10-CM | POA: Diagnosis not present

## 2019-08-19 DIAGNOSIS — D75839 Thrombocytosis, unspecified: Secondary | ICD-10-CM

## 2019-08-19 LAB — CBC WITH DIFFERENTIAL (CANCER CENTER ONLY)
Abs Immature Granulocytes: 0.05 10*3/uL (ref 0.00–0.07)
Basophils Absolute: 0 10*3/uL (ref 0.0–0.1)
Basophils Relative: 0 %
Eosinophils Absolute: 0.1 10*3/uL (ref 0.0–0.5)
Eosinophils Relative: 3 %
HCT: 36.8 % (ref 36.0–46.0)
Hemoglobin: 11.7 g/dL — ABNORMAL LOW (ref 12.0–15.0)
Immature Granulocytes: 1 %
Lymphocytes Relative: 20 %
Lymphs Abs: 1 10*3/uL (ref 0.7–4.0)
MCH: 33.6 pg (ref 26.0–34.0)
MCHC: 31.8 g/dL (ref 30.0–36.0)
MCV: 105.7 fL — ABNORMAL HIGH (ref 80.0–100.0)
Monocytes Absolute: 0.4 10*3/uL (ref 0.1–1.0)
Monocytes Relative: 8 %
Neutro Abs: 3.3 10*3/uL (ref 1.7–7.7)
Neutrophils Relative %: 68 %
Platelet Count: 501 10*3/uL — ABNORMAL HIGH (ref 150–400)
RBC: 3.48 MIL/uL — ABNORMAL LOW (ref 3.87–5.11)
RDW: 14.3 % (ref 11.5–15.5)
WBC Count: 4.9 10*3/uL (ref 4.0–10.5)
nRBC: 0 % (ref 0.0–0.2)

## 2019-08-19 LAB — CMP (CANCER CENTER ONLY)
ALT: 23 U/L (ref 0–44)
AST: 31 U/L (ref 15–41)
Albumin: 4.3 g/dL (ref 3.5–5.0)
Alkaline Phosphatase: 80 U/L (ref 38–126)
Anion gap: 7 (ref 5–15)
BUN: 19 mg/dL (ref 8–23)
CO2: 30 mmol/L (ref 22–32)
Calcium: 9.2 mg/dL (ref 8.9–10.3)
Chloride: 98 mmol/L (ref 98–111)
Creatinine: 1.24 mg/dL — ABNORMAL HIGH (ref 0.44–1.00)
GFR, Est AFR Am: 52 mL/min — ABNORMAL LOW (ref >60)
GFR, Estimated: 45 mL/min — ABNORMAL LOW (ref >60)
Glucose, Bld: 126 mg/dL — ABNORMAL HIGH (ref 70–99)
Potassium: 3.7 mmol/L (ref 3.5–5.1)
Sodium: 135 mmol/L (ref 135–145)
Total Bilirubin: 0.4 mg/dL (ref 0.3–1.2)
Total Protein: 7.8 g/dL (ref 6.5–8.1)

## 2019-08-19 LAB — RETICULOCYTES
Immature Retic Fract: 14.2 % (ref 2.3–15.9)
RBC.: 3.44 MIL/uL — ABNORMAL LOW (ref 3.87–5.11)
Retic Count, Absolute: 48.5 10*3/uL (ref 19.0–186.0)
Retic Ct Pct: 1.4 % (ref 0.4–3.1)

## 2019-08-19 LAB — SAVE SMEAR(SSMR), FOR PROVIDER SLIDE REVIEW

## 2019-08-19 NOTE — Progress Notes (Signed)
Hematology and Oncology Follow Up Visit  Rachael Johnson [name] 09/20/1950 69 y.o. 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  She is feeling better.  She feels that her platelet count is improved.  In fact, she is right, as usual.  Her platelet count is now down to 500,000.  Hopefully, the increase in the PEG-interferon is going to help in the long-term.  She got through the Christmas and New Year's holiday.  She felt well.  She was able to be with her family.  She has had no problems with cough.  There is no shortness of breath.  She has had no change in bowel or bladder habits.  She has had no rashes.  There is been no leg swelling.  She has had no headache.  Overall, her performance status is ECOG 1.     Medications:  Allergies as of 08/19/2019   No Known Allergies     Medication List       Accurate as of August 19, 2019 11:27 AM. If you have any questions, ask your nurse or doctor.        albuterol 108 (90 Base) MCG/ACT inhaler Commonly known as: VENTOLIN HFA INHALE 2 PUFFS BY MOUTH EVERY 6 HOURS AS NEEDED FOR WHEEZING OR SHORTNESS OF BREATH   Aller-Chlor 4 MG tablet Generic drug: chlorpheniramine TAKE 2 TABLETS BY MOUTH EVERY NIGHT AT BEDTIME AS NEEDED FOR ALLERGIES   ALPRAZolam 1 MG tablet Commonly known as: XANAX Take 1 tablet (1 mg total) by mouth 3 (three) times daily as needed for anxiety.   atorvastatin 10 MG tablet Commonly known as: LIPITOR Take 1 tablet (10 mg total) by mouth daily.     budesonide-formoterol 80-4.5 MCG/ACT inhaler Commonly known as: Symbicort Inhale 2 puffs into the lungs 2 (two) times daily.   busPIRone 15 MG tablet Commonly known as: BUSPAR TAKE 1 TABLET BY MOUTH FOUR TIMES DAILY AS NEEDED   Cranberry 300 MG tablet Take 300 mg by mouth 2 (two) times daily.   cyclobenzaprine 10 MG tablet Commonly known as: FLEXERIL TAKE 1 TABLET BY MOUTH TWICE DAILY AS NEEDED FOR MUSCLE SPASM   EQ Allergy Relief Childrens 12.5 MG/5ML liquid Generic drug: diphenhydrAMINE TAKE 5 ML BY MOUTH THREE TIMES DAILY AS NEEDED FOR MOUTH  PAIN   estradiol 0.5 MG tablet Commonly known as: ESTRACE Take 1 tablet (0.5 mg total) by mouth 2 (two) times daily.   famotidine 40 MG tablet Commonly known as: PEPCID Take 1 tablet (40 mg total) by mouth daily.   fluconazole 150 MG tablet Commonly known as: DIFLUCAN TAKE 1 TABLET BY MOUTH ONCE WEEKLY   fluticasone 50 MCG/ACT nasal spray Commonly known as: FLONASE Place 2 sprays into both nostrils daily.   furosemide 20 MG tablet Commonly known as: LASIX TAKE 1 TABLET BY MOUTH 3 TIMES DAILY AS NEEDED FOR EDEMA   gabapentin 400 MG capsule Commonly known as: NEURONTIN Take 1 capsule (400 mg total) by mouth daily.   gabapentin 100 MG capsule Commonly known as: NEURONTIN  TAKE FOUR (4) CAPSULES BY MOUTH EVERY NIGHT AT BEDTIME   levothyroxine 100 MCG tablet Commonly known as: Euthyrox TAKE 1 TABLET BY MOUTH ONCE DAILY BEFORE BREAKFAST   magic mouthwash Soln Take 5 mLs by mouth 3 (three) times daily as needed for mouth pain. Components benadryl  525 mg, hydrocortisone 60 mg and nystatin 0.6 mg. 240 ml - Oral   meclizine 12.5 MG tablet Commonly known as: ANTIVERT TAKE 1 TABLET BY MOUTH 3 TIMES DAILY AS NEEDED FOR DIZZINESS   montelukast 10 MG tablet Commonly known as: SINGULAIR Take 1 tablet (10 mg total) by mouth at bedtime.   MULTIVITAMIN PO Take 1 tablet by mouth every morning.   nystatin 100000 UNIT/ML  suspension Commonly known as: MYCOSTATIN Take 5 mLs (500,000 Units total) by mouth 4 (four) times daily.   ondansetron 8 MG tablet Commonly known as: ZOFRAN TAKE 1/2-1 TABLET BY MOUTH EVERY 8 HOURS AS NEEDED FOR NAUSEA AND VOMITING   optichamber diamond Misc USE as directed. Use with inhaler   oxyCODONE-acetaminophen 10-325 MG tablet Commonly known as: PERCOCET Take 1 tablet by mouth every 6 (six) hours as needed for pain.   Pegasys 180 MCG/ML injection Generic drug: peginterferon alfa-2a Inject 0.5 mLs (90 mcg total) into the skin every 7 (seven) days.   Restasis 0.05 % ophthalmic emulsion Generic drug: cycloSPORINE PLACE ONE DROP INTO EACH EYE EVERY 12 HOURS   Sancuso 3.1 MG/24HR Generic drug: granisetron APPLY 1 PATCH TOPICALLY AS NEEDED FOR NAUSEA, REMOVE AFTER 7 DAYS   SSD 1 % cream Generic drug: silver sulfADIAZINE APPLY TOPICALLY DAILY   traZODone 100 MG tablet Commonly known as: DESYREL Take 1 tablet (100 mg total) by mouth at bedtime.   TUBERCULIN SYR 1CC/27GX1/2" 27G X 1/2" 1 ML Misc Use as directed once weekly for peg-interferon injections   venlafaxine XR 75 MG 24 hr capsule Commonly known as: Effexor XR Take 1 capsule (75 mg total) by mouth 3 (three) times daily.       Allergies: No Known Allergies  Past Medical History, Surgical history, Social history, and Family History were reviewed and updated.  Review of Systems: Review of Systems  Constitutional: Positive for diaphoresis and malaise/fatigue.  HENT: Negative.   Eyes: Negative.   Respiratory: Positive for shortness of breath.   Cardiovascular: Negative.   Gastrointestinal: Positive for abdominal pain, heartburn and nausea.  Genitourinary: Negative.   Musculoskeletal: Positive for joint pain and myalgias.  Skin: Negative.   Neurological: Positive for dizziness.  Endo/Heme/Allergies: Negative.   Psychiatric/Behavioral: Negative.      Physical Exam:  height is 5' 3.5" (1.613 m) and  weight is 154 lb (69.9 kg). Her temporal temperature is 98.4 F (36.9 C). Her blood pressure is 132/89 and her pulse is 100. Her respiration is 18 and oxygen saturation is 99%.   Wt Readings from Last 3 Encounters:  08/19/19 154 lb (69.9 kg)  07/21/19 152 lb (68.9 kg)  06/16/19 151 lb 8 oz (68.7 kg)    Physical Exam Vitals reviewed.  HENT:     Head: Normocephalic and atraumatic.  Eyes:     Pupils: Pupils are equal, round, and reactive to light.  Cardiovascular:     Rate and Rhythm: Normal rate and regular rhythm.     Heart sounds: Normal heart sounds.  Pulmonary:     Effort: Pulmonary effort is normal.     Breath sounds: Normal breath sounds.  Abdominal:     General: Bowel sounds are normal.  Palpations: Abdomen is soft.     Comments: Her spleen tip is palpable a couple centimeters below the left costal margin.  Musculoskeletal:        General: No tenderness or deformity. Normal range of motion.     Cervical back: Normal range of motion.  Lymphadenopathy:     Cervical: No cervical adenopathy.  Skin:    General: Skin is warm and dry.     Findings: No erythema or rash.  Neurological:     Mental Status: She is alert and oriented to person, place, and time.  Psychiatric:        Behavior: Behavior normal.        Thought Content: Thought content normal.        Judgment: Judgment normal.      Lab Results  Component Value Date   WBC 4.9 08/19/2019   HGB 11.7 (L) 08/19/2019   HCT 36.8 08/19/2019   MCV 105.7 (H) 08/19/2019   PLT 501 (H) 08/19/2019   Lab Results  Component Value Date   FERRITIN 1,631 (H) 07/21/2019   IRON 89 07/21/2019   TIBC 191 (L) 07/21/2019   UIBC 102 (L) 07/21/2019   IRONPCTSAT 47 07/21/2019   Lab Results  Component Value Date   RETICCTPCT 1.4 08/19/2019   RBC 3.44 (L) 08/19/2019   RBC 3.48 (L) 08/19/2019   RETICCTABS 45.8 06/02/2015   No results found for: KPAFRELGTCHN, LAMBDASER, KAPLAMBRATIO No results found for: IGGSERUM, IGA,  IGMSERUM No results found for: Odetta Pink, SPEI   Chemistry      Component Value Date/Time   NA 135 08/19/2019 1044   NA 143 07/16/2017 1057   NA 140 11/29/2016 0936   K 3.7 08/19/2019 1044   K 3.2 (L) 07/16/2017 1057   K 4.2 11/29/2016 0936   CL 98 08/19/2019 1044   CL 104 07/16/2017 1057   CO2 30 08/19/2019 1044   CO2 25 07/16/2017 1057   CO2 27 11/29/2016 0936   BUN 19 08/19/2019 1044   BUN 10 07/16/2017 1057   BUN 18.3 11/29/2016 0936   CREATININE 1.24 (H) 08/19/2019 1044   CREATININE 0.8 07/16/2017 1057   CREATININE 0.8 11/29/2016 0936      Component Value Date/Time   CALCIUM 9.2 08/19/2019 1044   CALCIUM 8.9 07/16/2017 1057   CALCIUM 8.9 11/29/2016 0936   ALKPHOS 80 08/19/2019 1044   ALKPHOS 71 07/16/2017 1057   ALKPHOS 77 11/29/2016 0936   AST 31 08/19/2019 1044   AST 19 11/29/2016 0936   ALT 23 08/19/2019 1044   ALT 27 07/16/2017 1057   ALT 10 11/29/2016 0936   BILITOT 0.4 08/19/2019 1044   BILITOT <0.22 11/29/2016 0936      Impression and Plan: Ms. Acevedo is a very pleasant 69 yo caucasian female with essential thrombocythemia.   Again, the increased dose of PEG interferon seems to be helping.  I think we can now move her appointments out a little bit more.  We will have her come back in 5 weeks.  Hopefully, her quality of life will continue to do okay.  [name], MD 1/19/202111:27 AM

## 2019-08-20 ENCOUNTER — Other Ambulatory Visit: Payer: Self-pay | Admitting: Family Medicine

## 2019-08-20 DIAGNOSIS — I10 Essential (primary) hypertension: Secondary | ICD-10-CM

## 2019-08-20 DIAGNOSIS — R739 Hyperglycemia, unspecified: Secondary | ICD-10-CM

## 2019-08-20 DIAGNOSIS — E559 Vitamin D deficiency, unspecified: Secondary | ICD-10-CM

## 2019-08-20 DIAGNOSIS — R300 Dysuria: Secondary | ICD-10-CM

## 2019-08-20 LAB — IRON AND TIBC
Iron: 73 ug/dL (ref 41–142)
Saturation Ratios: 34 % (ref 21–57)
TIBC: 216 ug/dL — ABNORMAL LOW (ref 236–444)
UIBC: 143 ug/dL (ref 120–384)

## 2019-08-20 LAB — FERRITIN: Ferritin: 3146 ng/mL — ABNORMAL HIGH (ref 11–307)

## 2019-08-21 NOTE — Telephone Encounter (Signed)
Requesting: xanax Contract:yes UDS: moderate risk next screen 07/15/19 Last OV:04/21/19 Next OV:08/22/19 Last Refill:06/23/19  #[id number] Database:   Please advise

## 2019-08-22 ENCOUNTER — Ambulatory Visit (INDEPENDENT_AMBULATORY_CARE_PROVIDER_SITE_OTHER): Payer: Medicare Other | Admitting: Family Medicine

## 2019-08-22 ENCOUNTER — Encounter: Payer: Self-pay | Admitting: Family Medicine

## 2019-08-22 ENCOUNTER — Other Ambulatory Visit: Payer: Self-pay

## 2019-08-22 VITALS — Wt 142.0 lb

## 2019-08-22 DIAGNOSIS — I10 Essential (primary) hypertension: Secondary | ICD-10-CM

## 2019-08-22 DIAGNOSIS — R911 Solitary pulmonary nodule: Secondary | ICD-10-CM

## 2019-08-22 DIAGNOSIS — M549 Dorsalgia, unspecified: Secondary | ICD-10-CM | POA: Diagnosis not present

## 2019-08-22 DIAGNOSIS — R0602 Shortness of breath: Secondary | ICD-10-CM

## 2019-08-22 DIAGNOSIS — J449 Chronic obstructive pulmonary disease, unspecified: Secondary | ICD-10-CM | POA: Diagnosis not present

## 2019-08-22 DIAGNOSIS — K047 Periapical abscess without sinus: Secondary | ICD-10-CM

## 2019-08-22 DIAGNOSIS — E559 Vitamin D deficiency, unspecified: Secondary | ICD-10-CM

## 2019-08-22 DIAGNOSIS — E032 Hypothyroidism due to medicaments and other exogenous substances: Secondary | ICD-10-CM

## 2019-08-22 DIAGNOSIS — G8929 Other chronic pain: Secondary | ICD-10-CM

## 2019-08-22 DIAGNOSIS — J45991 Cough variant asthma: Secondary | ICD-10-CM

## 2019-08-22 MED ORDER — AMOXICILLIN 500 MG PO CAPS: 500.0000 mg | ORAL_CAPSULE | Freq: Three times a day (TID) | ORAL | 0 refills | Status: DC

## 2019-08-22 NOTE — Assessment & Plan Note (Addendum)
Referred to pulmonology for further evaluation. She is requesting a transfer of her care to the [facility] location.

## 2019-08-22 NOTE — Assessment & Plan Note (Signed)
Supplement and monitor 

## 2019-08-22 NOTE — Assessment & Plan Note (Signed)
On Levothyroxine, continue to monitor 

## 2019-08-22 NOTE — Assessment & Plan Note (Addendum)
Monitor and report any concerns. No changes to meds. Encouraged heart healthy diet such as the DASH diet and exercise as tolerated.  °

## 2019-08-22 NOTE — Progress Notes (Signed)
Virtual Visit via phone Note  I connected with Rachael Johnson on 08/22/19 at 10:00 AM EST by a phone enabled telemedicine application and verified that I am speaking with the correct person using two identifiers.  Location: Patient: home Provider: home   I discussed the limitations of evaluation and management by telemedicine and the availability of in person appointments. The patient expressed understanding and agreed to proceed. Rachael Johnson, CMA was able to get the patient set upon a visit, phone after she was unable to set up a video visit   Subjective:    Patient ID: Rachael Johnson, female    DOB: [birth year], 69 y.o.   MRN: [id number]  Chief Complaint  Patient presents with  . Follow-up    HPI Patient is in today for follow up on chronic medical concerns. She is staying home and maintaining quarantine well with family help. She has not had any recent febrile illness or hospitalizations. She is noting she has had to have her oncology meds changed as her disease has progressed. Her chronic pain has persisted but is not worse. Denies CP/palp/HA/congestion/fevers/GI or GU c/o. Taking meds as prescribed. She is interested in transferring her pulmonary care her to the [facility] for convenience and has not seen pulmonology lately.   Past Medical History:  Diagnosis Date  . Acute renal insufficiency 04/09/2017  . Anemia, iron deficiency 07/08/2012  . Arthritis   . Cataract 12/21/2014  . COPD (chronic obstructive pulmonary disease) ([place]) 01/23/2017   01/23/2017   Walked RA  2 laps @ 185 ft each stopped due to  Cough > > sob/ no desat nl pace  . Depression with anxiety 03/24/2011  . Diverticulitis   . Esophageal reflux 08/17/2013  . Essential thrombocythemia ([place]) 01/24/2011  . Frequent episodic tension-type headache   . Gastroenteritis 12/21/2014  . 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 GERD (gastroesophageal reflux disease)   . H. pylori infection 12/19/2012  . Hyperglycemia 12/17/2015  .  Hypertension   . Iron deficiency anemia due to chronic blood loss 04/03/2017  . Osteoarthritis 05/24/2017  . Other and unspecified hyperlipidemia 02/25/2013  . Restless leg syndrome 09/30/2014  . Rhabdomyolysis 02/2017  . Scabies 03/28/2015  . 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 Seizure ([place])    childhood  . Shoulder wound, right, sequela 03/14/2017  . Thyroid disease     Past Surgical History:  Procedure Laterality Date  . ABDOMINAL HYSTERECTOMY     1992  . BREAST BIOPSY    . BREAST CYST ASPIRATION    . BREAST SURGERY  1992   biopsy, benign. Fibrocystic  . UMBILICAL HERNIA REPAIR N/A 09/25/2012   Procedure: HERNIA REPAIR UMBILICAL ADULT;  Surgeon: Harl Bowie, MD;  Location: [facility];  Service: General;  Laterality: N/A;    Family History  Problem Relation Age of Onset  . Arthritis Mother   . Cancer Mother        ovarian  . Hypertension Mother   . Heart disease Mother        pacer  . Heart failure Mother   . Arthritis Father   . Cancer Father        lung  . Hypertension Father   . Cancer Sister        lung  . Cancer Brother        prostate  . Cancer Brother        lung  . Hypertension Son   . COPD Brother   . Heart disease  Brother        died from CHF  . Hypertension Brother   . Cancer Brother        colon  . Alcohol abuse Brother   . Cirrhosis Brother   . Seizures Sister   . Stroke Sister   . Arthritis Brother     Social History   Socioeconomic History  . Marital status: Widowed    Spouse name: Not on file  . Number of children: 2  . Years of education: Not on file  . Highest education level: Not on file  Occupational History  . Occupation: APL IT sales professional: [employer]    Comment: [employer]  Tobacco Use  . Smoking status: Former Smoker    Packs/day: 3.50    Years: 20.00    Pack years: 70.00    Types: Cigarettes    Start date: 11/15/1970    Quit date: 07/31/1990    Years since quitting: 29.0  . Smokeless tobacco: Never Used  .  Tobacco comment: quit 23 years ago  Substance and Sexual Activity  . Alcohol use: No    Alcohol/week: 0.0 standard drinks  . Drug use: No  . Sexual activity: Never  Other Topics Concern  . Not on file  Social History Narrative   Regular exercise: no   Caffeine Use: 2-3 weekly   Social Determinants of Health   Financial Resource Strain:   . Difficulty of Paying Living Expenses: Not on file  Food Insecurity:   . Worried About Charity fundraiser in the Last Year: Not on file  . Ran Out of Food in the Last Year: Not on file  Transportation Needs:   . Lack of Transportation (Medical): Not on file  . Lack of Transportation (Non-Medical): Not on file  Physical Activity:   . Days of Exercise per Week: Not on file  . Minutes of Exercise per Session: Not on file  Stress:   . Feeling of Stress : Not on file  Social Connections:   . Frequency of Communication with Friends and Family: Not on file  . Frequency of Social Gatherings with Friends and Family: Not on file  . Attends Religious Services: Not on file  . Active Member of Clubs or Organizations: Not on file  . Attends Archivist Meetings: Not on file  . Marital Status: Not on file  Intimate Partner Violence:   . Fear of Current or Ex-Partner: Not on file  . Emotionally Abused: Not on file  . Physically Abused: Not on file  . Sexually Abused: Not on file    Outpatient Medications Prior to Visit  Medication Sig Dispense Refill  . ALPRAZolam (XANAX) 1 MG tablet TAKE 1 TABLET BY MOUTH 3 TIMES DAILY AS NEEDED FOR ANXIETY 90 tablet 0  . albuterol (VENTOLIN HFA) 108 (90 Base) MCG/ACT inhaler INHALE 2 PUFFS BY MOUTH EVERY 6 HOURS AS NEEDED FOR WHEEZING OR SHORTNESS OF BREATH 54 g 0  . ALLER-CHLOR 4 MG tablet TAKE 2 TABLETS BY MOUTH EVERY NIGHT AT BEDTIME AS NEEDED FOR ALLERGIES 30 tablet 1  . atorvastatin (LIPITOR) 10 MG tablet Take 1 tablet (10 mg total) by mouth daily. 90 tablet 3  . budesonide-formoterol (SYMBICORT)  80-4.5 MCG/ACT inhaler Inhale 2 puffs into the lungs 2 (two) times daily. 30.6 g 1  . busPIRone (BUSPAR) 15 MG tablet TAKE 1 TABLET BY MOUTH FOUR TIMES DAILY AS NEEDED 120 tablet 4  . Cranberry 300 MG tablet Take 300 mg  by mouth 2 (two) times daily.    . cyclobenzaprine (FLEXERIL) 10 MG tablet TAKE 1 TABLET BY MOUTH TWICE DAILY AS NEEDED FOR MUSCLE SPASM 60 tablet 0  . EQ ALLERGY RELIEF CHILDRENS 12.5 MG/5ML liquid TAKE 5 ML BY MOUTH THREE TIMES DAILY AS NEEDED FOR MOUTH  PAIN 240 mL 0  . estradiol (ESTRACE) 0.5 MG tablet Take 1 tablet (0.5 mg total) by mouth 2 (two) times daily. 180 tablet 1  . famotidine (PEPCID) 40 MG tablet Take 1 tablet (40 mg total) by mouth daily. 90 tablet 1  . fluconazole (DIFLUCAN) 150 MG tablet TAKE 1 TABLET BY MOUTH ONCE WEEKLY 2 tablet 10  . fluticasone (FLONASE) 50 MCG/ACT nasal spray INSTILL TWO (2) SPRAYS IN EACH NOSTRIL ONCE DAILY 16 g 2  . furosemide (LASIX) 20 MG tablet TAKE 1 TABLET BY MOUTH 3 TIMES DAILY AS NEEDED FOR EDEMA 90 tablet 0  . gabapentin (NEURONTIN) 100 MG capsule TAKE FOUR (4) CAPSULES BY MOUTH EVERY NIGHT AT BEDTIME 120 capsule 0  . gabapentin (NEURONTIN) 400 MG capsule Take 1 capsule (400 mg total) by mouth daily. 90 capsule 1  . granisetron (SANCUSO) 3.1 MG/24HR APPLY 1 PATCH TOPICALLY AS NEEDED FOR NAUSEA, REMOVE AFTER 7 DAYS 4 patch 4  . levothyroxine (EUTHYROX) 100 MCG tablet TAKE 1 TABLET BY MOUTH ONCE DAILY BEFORE BREAKFAST 90 tablet 1  . magic mouthwash SOLN Take 5 mLs by mouth 3 (three) times daily as needed for mouth pain. Components benadryl  525 mg, hydrocortisone 60 mg and nystatin 0.6 mg. 240 ml - Oral 240 mL 3  . meclizine (ANTIVERT) 12.5 MG tablet TAKE 1 TABLET BY MOUTH 3 TIMES DAILY AS NEEDED FOR DIZZINESS 40 tablet 0  . montelukast (SINGULAIR) 10 MG tablet Take 1 tablet (10 mg total) by mouth at bedtime. 90 tablet 2  . Multiple Vitamin (MULTIVITAMIN PO) Take 1 tablet by mouth every morning.     . nystatin (MYCOSTATIN) 100000  UNIT/ML suspension Take 5 mLs (500,000 Units total) by mouth 4 (four) times daily. 60 mL 3  . ondansetron (ZOFRAN) 8 MG tablet TAKE 1/2-1 TABLET BY MOUTH EVERY 8 HOURS AS NEEDED FOR NAUSEA AND VOMITING 90 tablet 10  . oxyCODONE-acetaminophen (PERCOCET) 10-325 MG tablet Take 1 tablet by mouth every 6 (six) hours as needed for pain. 120 tablet 0  . peginterferon alfa-2a (PEGASYS) 180 MCG/ML injection Inject 0.5 mLs (90 mcg total) into the skin every 7 (seven) days. 4 mL 6  . RESTASIS 0.05 % ophthalmic emulsion PLACE ONE DROP INTO EACH EYE EVERY 12 HOURS 360 each 3  . Spacer/Aero-Holding Chambers (OPTICHAMBER DIAMOND) MISC USE as directed. Use with inhaler 1 each 0  . SSD 1 % cream APPLY TOPICALLY DAILY 50 g 0  . traZODone (DESYREL) 100 MG tablet TAKE 1 TABLET BY MOUTH AT BEDTIME 30 tablet 0  . TUBERCULIN SYR 1CC/27GX1/2" 27G X 1/2" 1 ML MISC Use as directed once weekly for peg-interferon injections 4 each 11  . venlafaxine XR (EFFEXOR XR) 75 MG 24 hr capsule Take 1 capsule (75 mg total) by mouth 3 (three) times daily. 90 capsule 3   No facility-administered medications prior to visit.    No Known Allergies  Review of Systems  Constitutional: Positive for malaise/fatigue. Negative for fever.  HENT: Negative for congestion.   Eyes: Negative for blurred vision.  Respiratory: Negative for shortness of breath.   Cardiovascular: Negative for chest pain, palpitations and leg swelling.  Gastrointestinal: Negative for abdominal pain, blood  in stool and nausea.  Genitourinary: Negative for dysuria and frequency.  Musculoskeletal: Positive for back pain. Negative for falls.  Skin: Negative for rash.  Neurological: Negative for dizziness, loss of consciousness and headaches.  Endo/Heme/Allergies: Negative for environmental allergies.  Psychiatric/Behavioral: Negative for depression. The patient is nervous/anxious.        Objective:    Physical Exam unable to complete via phone visit.   Wt 142  lb (64.4 kg)   LMP 07/31/1990   BMI 24.76 kg/m  Wt Readings from Last 3 Encounters:  08/22/19 142 lb (64.4 kg)  08/19/19 154 lb (69.9 kg)  07/21/19 152 lb (68.9 kg)    Diabetic Foot Exam - Simple   No data filed     Lab Results  Component Value Date   WBC 4.9 08/19/2019   HGB 11.7 (L) 08/19/2019   HCT 36.8 08/19/2019   PLT 501 (H) 08/19/2019   GLUCOSE 126 (H) 08/19/2019   CHOL 236 (H) 05/06/2018   TRIG 322.0 (H) 05/06/2018   HDL 59.00 05/06/2018   LDLDIRECT 95.0 05/06/2018   LDLCALC 82 02/12/2017   ALT 23 08/19/2019   AST 31 08/19/2019   NA 135 08/19/2019   K 3.7 08/19/2019   CL 98 08/19/2019   CREATININE 1.24 (H) 08/19/2019   BUN 19 08/19/2019   CO2 30 08/19/2019   TSH 0.201 (L) 07/21/2019   INR 0.94 09/16/2018   HGBA1C 5.0 08/10/2017    Lab Results  Component Value Date   TSH 0.201 (L) 07/21/2019   Lab Results  Component Value Date   WBC 4.9 08/19/2019   HGB 11.7 (L) 08/19/2019   HCT 36.8 08/19/2019   MCV 105.7 (H) 08/19/2019   PLT 501 (H) 08/19/2019   Lab Results  Component Value Date   NA 135 08/19/2019   K 3.7 08/19/2019   CHLORIDE 105 11/29/2016   CO2 30 08/19/2019   GLUCOSE 126 (H) 08/19/2019   BUN 19 08/19/2019   CREATININE 1.24 (H) 08/19/2019   BILITOT 0.4 08/19/2019   ALKPHOS 80 08/19/2019   AST 31 08/19/2019   ALT 23 08/19/2019   PROT 7.8 08/19/2019   ALBUMIN 4.3 08/19/2019   CALCIUM 9.2 08/19/2019   ANIONGAP 7 08/19/2019   EGFR 80 (L) 11/29/2016   GFR 62.50 05/06/2018   Lab Results  Component Value Date   CHOL 236 (H) 05/06/2018   Lab Results  Component Value Date   HDL 59.00 05/06/2018   Lab Results  Component Value Date   LDLCALC 82 02/12/2017   Lab Results  Component Value Date   TRIG 322.0 (H) 05/06/2018   Lab Results  Component Value Date   CHOLHDL 4 05/06/2018   Lab Results  Component Value Date   HGBA1C 5.0 08/10/2017       Assessment & Plan:   Problem List Items Addressed This Visit    Hypothyroid      On Levothyroxine, continue to monitor      Vitamin D deficiency    Supplement and monitor      Essential hypertension    Monitor and report any concerns. No changes to meds. Encouraged heart healthy diet such as the DASH diet and exercise as tolerated.       Cough variant asthma vs UACS   Relevant Orders   Ambulatory referral to Pulmonology   Lung nodule - Primary   Relevant Orders   Ambulatory referral to Pulmonology   COPD (chronic obstructive pulmonary disease) ([place])    Referred to  pulmonology for further evaluation. She is requesting a transfer of her care to the [facility] location.       Chronic back pain    Encouraged moist heat and gentle stretching as tolerated. May try NSAIDs and prescription meds as directed and report if symptoms worsen or seek immediate care      Dental infection    Just had several teeth pulled and is having pain and swelling in right lower jaw line. Started on Amoxicillin 500 mg tid x 7 days. Follow up with dentist.          I am having Rebecah D. Greenley start on amoxicillin. I am also having her maintain her Multiple Vitamin (MULTIVITAMIN PO), Cranberry, TUBERCULIN SYR 1CC/27GX1/2", SSD, EQ Allergy Relief Childrens, magic mouthwash, montelukast, cyclobenzaprine, optichamber diamond, atorvastatin, estradiol, levothyroxine, famotidine, gabapentin, albuterol, venlafaxine XR, Restasis, budesonide-formoterol, nystatin, Aller-Chlor, fluconazole, busPIRone, Sancuso, Pegasys, oxyCODONE-acetaminophen, ondansetron, traZODone, fluticasone, gabapentin, furosemide, meclizine, and ALPRAZolam.  Meds ordered this encounter  Medications  . amoxicillin (AMOXIL) 500 MG capsule    Sig: Take 1 capsule (500 mg total) by mouth 3 (three) times daily.    Dispense:  30 capsule    Refill:  0     I discussed the assessment and treatment plan with the patient. The patient was provided an opportunity to ask questions and all were answered. The patient agreed  with the plan and demonstrated an understanding of the instructions.   The patient was advised to call back or seek an in-person evaluation if the symptoms worsen or if the condition fails to improve as anticipated.  I provided 25 minutes of non-face-to-face time during this encounter.   Penni Homans, MD

## 2019-08-22 NOTE — Assessment & Plan Note (Signed)
Encouraged moist heat and gentle stretching as tolerated. May try NSAIDs and prescription meds as directed and report if symptoms worsen or seek immediate care 

## 2019-08-22 NOTE — Assessment & Plan Note (Signed)
Just had several teeth pulled and is having pain and swelling in right lower jaw line. Started on Amoxicillin 500 mg tid x 7 days. Follow up with dentist.

## 2019-08-28 NOTE — Addendum Note (Signed)
Addended by: Magdalene Molly A on: 08/28/2019 08:31 AM   Modules accepted: Orders

## 2019-09-02 ENCOUNTER — Encounter: Payer: Self-pay | Admitting: Family Medicine

## 2019-09-04 NOTE — Telephone Encounter (Signed)
Please schedule patients appointment  Please advise

## 2019-09-08 ENCOUNTER — Other Ambulatory Visit: Payer: Self-pay | Admitting: Family Medicine

## 2019-09-09 ENCOUNTER — Other Ambulatory Visit: Payer: Self-pay | Admitting: Hematology & Oncology

## 2019-09-09 ENCOUNTER — Other Ambulatory Visit: Payer: Self-pay | Admitting: Family Medicine

## 2019-09-09 ENCOUNTER — Telehealth: Payer: Self-pay | Admitting: Family Medicine

## 2019-09-09 DIAGNOSIS — M549 Dorsalgia, unspecified: Secondary | ICD-10-CM

## 2019-09-09 DIAGNOSIS — D473 Essential (hemorrhagic) thrombocythemia: Secondary | ICD-10-CM

## 2019-09-09 DIAGNOSIS — G8929 Other chronic pain: Secondary | ICD-10-CM

## 2019-09-09 MED ORDER — OXYCODONE-ACETAMINOPHEN 10-325 MG PO TABS: 1.0000 | ORAL_TABLET | Freq: Four times a day (QID) | ORAL | 0 refills | Status: DC | PRN

## 2019-09-09 MED FILL — BD TB SYRINGE 27GX1/2: 27G X 1/2" | 28 days supply | Qty: 4 | Fill #0

## 2019-09-09 MED FILL — BD TB SYRINGE 27GX1/2": 27G X 1/2" | 28 days supply | Qty: 4 | Fill #0

## 2019-09-09 NOTE — Telephone Encounter (Signed)
Requesting:  Oxycodone Contract:  [place] 04/15/2019 UDS:   04/15/2019 Last Visit:   08/22/2019 Next Visit:   10/16/2019 Last Refill:    08/11/2019   #120 no refills  Please Advise

## 2019-09-09 NOTE — Telephone Encounter (Signed)
Medication: oxyCODONE-acetaminophen (PERCOCET) 10-325 MG tablet    Has the patient contacted their pharmacy? No.  Preferred Pharmacy (with phone number or street name): [facility]  [address] [place] [address]  Agent: Please be advised that RX refills may take up to 3 business days. We ask that you follow-up with your pharmacy.

## 2019-09-17 MED FILL — PEGASYS 180 MCG/ML VIAL: 180 | 28 days supply | Qty: 4 | Fill #5

## 2019-09-22 ENCOUNTER — Encounter: Payer: Self-pay | Admitting: Family Medicine

## 2019-09-23 ENCOUNTER — Inpatient Hospital Stay: Payer: Medicare Other

## 2019-09-23 ENCOUNTER — Inpatient Hospital Stay: Payer: Medicare Other | Admitting: Hematology & Oncology

## 2019-09-25 ENCOUNTER — Other Ambulatory Visit: Payer: Self-pay | Admitting: Family Medicine

## 2019-09-25 DIAGNOSIS — R300 Dysuria: Secondary | ICD-10-CM

## 2019-09-25 DIAGNOSIS — I10 Essential (primary) hypertension: Secondary | ICD-10-CM

## 2019-09-25 DIAGNOSIS — R739 Hyperglycemia, unspecified: Secondary | ICD-10-CM

## 2019-09-25 DIAGNOSIS — E559 Vitamin D deficiency, unspecified: Secondary | ICD-10-CM

## 2019-09-26 NOTE — Telephone Encounter (Signed)
Requesting:xanax Contract:yes UDS:mod risk next screen 10/13/19 Last OV:08/22/19 Next OV:10/16/19 Last Refill:08/21/19  #[id number] Database:   Please advise

## 2019-09-30 ENCOUNTER — Inpatient Hospital Stay (HOSPITAL_BASED_OUTPATIENT_CLINIC_OR_DEPARTMENT_OTHER): Payer: Medicare Other | Admitting: Hematology & Oncology

## 2019-09-30 ENCOUNTER — Other Ambulatory Visit: Payer: Self-pay

## 2019-09-30 ENCOUNTER — Encounter: Payer: Self-pay | Admitting: Hematology & Oncology

## 2019-09-30 ENCOUNTER — Inpatient Hospital Stay: Payer: Medicare Other | Attending: Hematology & Oncology

## 2019-09-30 VITALS — BP 142/92 | HR 51 | Temp 96.9°F | Resp 18 | Ht 63.5 in | Wt 147.1 lb

## 2019-09-30 DIAGNOSIS — R42 Dizziness and giddiness: Secondary | ICD-10-CM | POA: Diagnosis not present

## 2019-09-30 DIAGNOSIS — M791 Myalgia, unspecified site: Secondary | ICD-10-CM | POA: Diagnosis not present

## 2019-09-30 DIAGNOSIS — R109 Unspecified abdominal pain: Secondary | ICD-10-CM | POA: Insufficient documentation

## 2019-09-30 DIAGNOSIS — M2550 Pain in unspecified joint: Secondary | ICD-10-CM | POA: Insufficient documentation

## 2019-09-30 DIAGNOSIS — R61 Generalized hyperhidrosis: Secondary | ICD-10-CM | POA: Diagnosis not present

## 2019-09-30 DIAGNOSIS — D500 Iron deficiency anemia secondary to blood loss (chronic): Secondary | ICD-10-CM

## 2019-09-30 DIAGNOSIS — R0602 Shortness of breath: Secondary | ICD-10-CM | POA: Insufficient documentation

## 2019-09-30 DIAGNOSIS — D473 Essential (hemorrhagic) thrombocythemia: Secondary | ICD-10-CM | POA: Insufficient documentation

## 2019-09-30 DIAGNOSIS — D7581 Myelofibrosis: Secondary | ICD-10-CM | POA: Diagnosis not present

## 2019-09-30 DIAGNOSIS — R110 Nausea: Secondary | ICD-10-CM | POA: Diagnosis not present

## 2019-09-30 DIAGNOSIS — Z79899 Other long term (current) drug therapy: Secondary | ICD-10-CM | POA: Diagnosis not present

## 2019-09-30 DIAGNOSIS — R12 Heartburn: Secondary | ICD-10-CM | POA: Diagnosis not present

## 2019-09-30 DIAGNOSIS — D509 Iron deficiency anemia, unspecified: Secondary | ICD-10-CM | POA: Insufficient documentation

## 2019-09-30 DIAGNOSIS — Z7982 Long term (current) use of aspirin: Secondary | ICD-10-CM | POA: Insufficient documentation

## 2019-09-30 DIAGNOSIS — D75839 Thrombocytosis, unspecified: Secondary | ICD-10-CM

## 2019-09-30 LAB — FERRITIN: Ferritin: 2810 ng/mL — ABNORMAL HIGH (ref 11–307)

## 2019-09-30 LAB — IRON AND TIBC
Iron: 161 ug/dL — ABNORMAL HIGH (ref 41–142)
Saturation Ratios: 89 % — ABNORMAL HIGH (ref 21–57)
TIBC: 181 ug/dL — ABNORMAL LOW (ref 236–444)
UIBC: 20 ug/dL — ABNORMAL LOW (ref 120–384)

## 2019-09-30 LAB — CBC WITH DIFFERENTIAL (CANCER CENTER ONLY)
Abs Immature Granulocytes: 0.03 10*3/uL (ref 0.00–0.07)
Basophils Absolute: 0 10*3/uL (ref 0.0–0.1)
Basophils Relative: 1 %
Eosinophils Absolute: 0.2 10*3/uL (ref 0.0–0.5)
Eosinophils Relative: 4 %
HCT: 36.5 % (ref 36.0–46.0)
Hemoglobin: 11.9 g/dL — ABNORMAL LOW (ref 12.0–15.0)
Immature Granulocytes: 1 %
Lymphocytes Relative: 24 %
Lymphs Abs: 1.2 10*3/uL (ref 0.7–4.0)
MCH: 33.9 pg (ref 26.0–34.0)
MCHC: 32.6 g/dL (ref 30.0–36.0)
MCV: 104 fL — ABNORMAL HIGH (ref 80.0–100.0)
Monocytes Absolute: 0.5 10*3/uL (ref 0.1–1.0)
Monocytes Relative: 11 %
Neutro Abs: 2.9 10*3/uL (ref 1.7–7.7)
Neutrophils Relative %: 59 %
Platelet Count: 717 10*3/uL — ABNORMAL HIGH (ref 150–400)
RBC: 3.51 MIL/uL — ABNORMAL LOW (ref 3.87–5.11)
RDW: 14.6 % (ref 11.5–15.5)
WBC Count: 4.8 10*3/uL (ref 4.0–10.5)
nRBC: 0 % (ref 0.0–0.2)

## 2019-09-30 LAB — CMP (CANCER CENTER ONLY)
ALT: 30 U/L (ref 0–44)
AST: 37 U/L (ref 15–41)
Albumin: 4.3 g/dL (ref 3.5–5.0)
Alkaline Phosphatase: 110 U/L (ref 38–126)
Anion gap: 9 (ref 5–15)
BUN: 16 mg/dL (ref 8–23)
CO2: 30 mmol/L (ref 22–32)
Calcium: 9.3 mg/dL (ref 8.9–10.3)
Chloride: 102 mmol/L (ref 98–111)
Creatinine: 0.87 mg/dL (ref 0.44–1.00)
GFR, Est AFR Am: >60 mL/min (ref >60)
GFR, Estimated: >60 mL/min (ref >60)
Glucose, Bld: 77 mg/dL (ref 70–99)
Potassium: 3.1 mmol/L — ABNORMAL LOW (ref 3.5–5.1)
Sodium: 141 mmol/L (ref 135–145)
Total Bilirubin: 0.4 mg/dL (ref 0.3–1.2)
Total Protein: 8.1 g/dL (ref 6.5–8.1)

## 2019-09-30 LAB — SAVE SMEAR(SSMR), FOR PROVIDER SLIDE REVIEW

## 2019-09-30 NOTE — Progress Notes (Signed)
Hematology and Oncology Follow Up Visit  Rachael Johnson [name] 09/16/1950 69 y.o. 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  Unfortunately, her platelet count is back up.  Her blood count is 715,000.  She is taking the interferon diligently.  It is unusual that she responds initially.  After that, then it seems as if her disease seems to figure out a way around the dose of the interferon.  We will go ahead and increase her dose up to 135 mcg a week.  She has a family give her the medication.  They are doing a good job.  She has had no fever.  She does not feel all that well.  She has chronic issues.  She has had no diarrhea.  She has had no issues with iron deficiency.  More last saw her, her ferritin was 3100 with iron saturation of 34%.  She has had no issues with rashes.  There is been no swelling in the legs.  Overall, her performance status is ECOG 1.     Medications:  Allergies as of 09/30/2019   No Known Allergies     Medication List       Accurate as of September 30, 2019  8:12 AM. If you have any questions, ask your nurse or doctor.        albuterol 108 (90 Base) MCG/ACT inhaler Commonly known as: VENTOLIN HFA INHALE 2 PUFFS BY MOUTH EVERY 6 HOURS AS NEEDED FOR WHEEZING OR SHORTNESS OF BREATH   Aller-Chlor 4 MG tablet Generic drug: chlorpheniramine TAKE 2 TABLETS BY MOUTH EVERY NIGHT AT BEDTIME AS NEEDED FOR ALLERGIES   ALPRAZolam 1 MG tablet Commonly known as: XANAX TAKE 1 TABLET BY MOUTH THREE TIMES DAILY AS  NEEDED FOR ANXIETY   amoxicillin 500 MG capsule Commonly known as: AMOXIL Take 1 capsule (500 mg total) by mouth 3 (three) times daily.   atorvastatin 10 MG tablet Commonly known as: LIPITOR Take 1 tablet (10 mg total) by mouth daily.   budesonide-formoterol 80-4.5 MCG/ACT inhaler Commonly known as: Symbicort Inhale 2 puffs into the lungs 2 (two) times daily.   busPIRone 15 MG tablet Commonly known as: BUSPAR TAKE 1 TABLET BY MOUTH FOUR TIMES DAILY AS NEEDED   Cranberry 300 MG tablet Take 300 mg by mouth 2 (two) times daily.   cyclobenzaprine 10 MG tablet Commonly known as: FLEXERIL TAKE 1 TABLET BY MOUTH TWICE DAILY AS NEEDED FOR MUSCLE SPASM   EQ Allergy Relief Childrens 12.5 MG/5ML liquid Generic drug: diphenhydrAMINE TAKE 5 ML BY MOUTH THREE TIMES DAILY AS NEEDED FOR MOUTH  PAIN   estradiol 0.5 MG tablet Commonly known as: ESTRACE Take 1 tablet (0.5 mg total) by mouth 2 (two) times daily.   famotidine 40 MG tablet Commonly known as: PEPCID Take 1 tablet (40 mg total) by mouth daily.   famotidine 20 MG tablet Commonly known as: PEPCID TAKE 2 TABLETS BY MOUTH ONCE DAILY   fluconazole 150 MG tablet Commonly known as: DIFLUCAN TAKE 1 TABLET BY MOUTH ONCE WEEKLY  fluticasone 50 MCG/ACT nasal spray Commonly known as: FLONASE INSTILL TWO (2) SPRAYS IN EACH NOSTRIL ONCE DAILY   furosemide 20 MG tablet Commonly known as: LASIX TAKE 1 TABLET BY MOUTH 3 TIMES DAILY AS NEEDED FOR EDEMA   gabapentin 400 MG capsule Commonly known as: NEURONTIN Take 1 capsule (400 mg total) by mouth daily.   gabapentin 100 MG capsule Commonly known as: NEURONTIN TAKE FOUR (4) CAPSULES BY MOUTH EVERY NIGHT AT BEDTIME   levothyroxine 100 MCG tablet Commonly known as: Euthyrox TAKE 1 TABLET BY MOUTH ONCE DAILY BEFORE BREAKFAST   magic mouthwash Soln Take 5 mLs by mouth 3 (three) times daily as needed for mouth pain. Components benadryl  525 mg, hydrocortisone 60 mg and nystatin  0.6 mg. 240 ml - Oral   meclizine 12.5 MG tablet Commonly known as: ANTIVERT TAKE 1 TABLET BY MOUTH THREE TIMES DAILY AS NEEDED FOR DIZZINESS   montelukast 10 MG tablet Commonly known as: SINGULAIR Take 1 tablet (10 mg total) by mouth at bedtime.   MULTIVITAMIN PO Take 1 tablet by mouth every morning.   nystatin 100000 UNIT/ML suspension Commonly known as: MYCOSTATIN Take 5 mLs (500,000 Units total) by mouth 4 (four) times daily.   ondansetron 8 MG tablet Commonly known as: ZOFRAN TAKE 1/2-1 TABLET BY MOUTH EVERY 8 HOURS AS NEEDED FOR NAUSEA AND VOMITING   optichamber diamond Misc USE as directed. Use with inhaler   oxyCODONE-acetaminophen 10-325 MG tablet Commonly known as: PERCOCET Take 1 tablet by mouth every 6 (six) hours as needed for pain.   Pegasys 180 MCG/ML injection Generic drug: peginterferon alfa-2a Inject 0.5 mLs (90 mcg total) into the skin every 7 (seven) days.   Restasis 0.05 % ophthalmic emulsion Generic drug: cycloSPORINE PLACE ONE DROP INTO EACH EYE EVERY 12 HOURS   Sancuso 3.1 MG/24HR Generic drug: granisetron APPLY 1 PATCH TOPICALLY AS NEEDED FOR NAUSEA, REMOVE AFTER 7 DAYS   SSD 1 % cream Generic drug: silver sulfADIAZINE APPLY TOPICALLY DAILY   traZODone 100 MG tablet Commonly known as: DESYREL TAKE 1 TABLET BY MOUTH AT BEDTIME   TUBERCULIN SYR 1CC/27GX1/2" 27G X 1/2" 1 ML Misc Commonly known as: B-D TB SYRINGE 1CC/27GX1/2" USE AS DIRECTED ONCE WEEKLY FOR PEG-INTERFERON INJECTIONS   venlafaxine XR 75 MG 24 hr capsule Commonly known as: EFFEXOR-XR TAKE 1 CAPSULE BY MOUTH THREE TIMES DAILY       Allergies: No Known Allergies  Past Medical History, Surgical history, Social history, and Family History were reviewed and updated.  Review of Systems: Review of Systems  Constitutional: Positive for diaphoresis and malaise/fatigue.  HENT: Negative.   Eyes: Negative.   Respiratory: Positive for shortness of breath.   Cardiovascular:  Negative.   Gastrointestinal: Positive for abdominal pain, heartburn and nausea.  Genitourinary: Negative.   Musculoskeletal: Positive for joint pain and myalgias.  Skin: Negative.   Neurological: Positive for dizziness.  Endo/Heme/Allergies: Negative.   Psychiatric/Behavioral: Negative.      Physical Exam:  height is 5' 3.5" (1.613 m) and weight is 147 lb 1.9 oz (66.7 kg). Her temporal temperature is 96.9 F (36.1 C) (abnormal). Her blood pressure is 142/92 (abnormal) and her pulse is 51 (abnormal). Her respiration is 18 and oxygen saturation is 99%.   Wt Readings from Last 3 Encounters:  09/30/19 147 lb 1.9 oz (66.7 kg)  08/22/19 142 lb (64.4 kg)  08/19/19 154 lb (69.9 kg)    Physical Exam Vitals reviewed.  HENT:     Head: Normocephalic and  atraumatic.  Eyes:     Pupils: Pupils are equal, round, and reactive to light.  Cardiovascular:     Rate and Rhythm: Normal rate and regular rhythm.     Heart sounds: Normal heart sounds.  Pulmonary:     Effort: Pulmonary effort is normal.     Breath sounds: Normal breath sounds.  Abdominal:     General: Bowel sounds are normal.     Palpations: Abdomen is soft.     Comments: Her spleen tip is palpable a couple centimeters below the left costal margin.  Musculoskeletal:        General: No tenderness or deformity. Normal range of motion.     Cervical back: Normal range of motion.  Lymphadenopathy:     Cervical: No cervical adenopathy.  Skin:    General: Skin is warm and dry.     Findings: No erythema or rash.  Neurological:     Mental Status: She is alert and oriented to person, place, and time.  Psychiatric:        Behavior: Behavior normal.        Thought Content: Thought content normal.        Judgment: Judgment normal.      Lab Results  Component Value Date   WBC 4.8 09/30/2019   HGB 11.9 (L) 09/30/2019   HCT 36.5 09/30/2019   MCV 104.0 (H) 09/30/2019   PLT 717 (H) 09/30/2019   Lab Results  Component Value Date     FERRITIN 3,146 (H) 08/19/2019   IRON 73 08/19/2019   TIBC 216 (L) 08/19/2019   UIBC 143 08/19/2019   IRONPCTSAT 34 08/19/2019   Lab Results  Component Value Date   RETICCTPCT 1.4 08/19/2019   RBC 3.51 (L) 09/30/2019   RETICCTABS 45.8 06/02/2015   No results found for: KPAFRELGTCHN, LAMBDASER, KAPLAMBRATIO No results found for: IGGSERUM, IGA, IGMSERUM No results found for: Odetta Pink, SPEI   Chemistry      Component Value Date/Time   NA 135 08/19/2019 1044   NA 143 07/16/2017 1057   NA 140 11/29/2016 0936   K 3.7 08/19/2019 1044   K 3.2 (L) 07/16/2017 1057   K 4.2 11/29/2016 0936   CL 98 08/19/2019 1044   CL 104 07/16/2017 1057   CO2 30 08/19/2019 1044   CO2 25 07/16/2017 1057   CO2 27 11/29/2016 0936   BUN 19 08/19/2019 1044   BUN 10 07/16/2017 1057   BUN 18.3 11/29/2016 0936   CREATININE 1.24 (H) 08/19/2019 1044   CREATININE 0.8 07/16/2017 1057   CREATININE 0.8 11/29/2016 0936      Component Value Date/Time   CALCIUM 9.2 08/19/2019 1044   CALCIUM 8.9 07/16/2017 1057   CALCIUM 8.9 11/29/2016 0936   ALKPHOS 80 08/19/2019 1044   ALKPHOS 71 07/16/2017 1057   ALKPHOS 77 11/29/2016 0936   AST 31 08/19/2019 1044   AST 19 11/29/2016 0936   ALT 23 08/19/2019 1044   ALT 27 07/16/2017 1057   ALT 10 11/29/2016 0936   BILITOT 0.4 08/19/2019 1044   BILITOT <0.22 11/29/2016 0936      Impression and Plan: Ms. Mesaros is a very pleasant 69 yo caucasian female with essential thrombocythemia.   Hopefully, we will see if the increase in the interferon dose is going to help her.  I certainly hope that it does.  I have to see her back in 4 weeks now.  Again had to keep 4-week intervals  for follow-up for the near future.  I will also check her iron studies make sure that her iron level is okay.  I just want her quality of life to be as she would like.  [name], MD 3/2/20218:12 AM

## 2019-10-01 ENCOUNTER — Encounter: Payer: Self-pay | Admitting: Hematology & Oncology

## 2019-10-05 ENCOUNTER — Encounter: Payer: Self-pay | Admitting: Family Medicine

## 2019-10-06 ENCOUNTER — Encounter: Payer: Self-pay | Admitting: Family Medicine

## 2019-10-06 ENCOUNTER — Other Ambulatory Visit: Payer: Self-pay

## 2019-10-06 DIAGNOSIS — G8929 Other chronic pain: Secondary | ICD-10-CM

## 2019-10-06 DIAGNOSIS — M549 Dorsalgia, unspecified: Secondary | ICD-10-CM

## 2019-10-06 MED ORDER — OXYCODONE-ACETAMINOPHEN 10-325 MG PO TABS: 1.0000 | ORAL_TABLET | Freq: Four times a day (QID) | ORAL | 0 refills | Status: DC | PRN

## 2019-10-06 NOTE — Telephone Encounter (Signed)
Requesting: Percocet Contract: Yes UDS: UTD Last OV: 1.22.21 Next OV: 3.18.21 Last Refill: 2.9.21   Please advise

## 2019-10-08 ENCOUNTER — Encounter: Payer: Self-pay | Admitting: Hematology & Oncology

## 2019-10-08 ENCOUNTER — Other Ambulatory Visit: Payer: Self-pay | Admitting: *Deleted

## 2019-10-08 DIAGNOSIS — H8110 Benign paroxysmal vertigo, unspecified ear: Secondary | ICD-10-CM

## 2019-10-08 DIAGNOSIS — D500 Iron deficiency anemia secondary to blood loss (chronic): Secondary | ICD-10-CM

## 2019-10-08 MED ORDER — SANCUSO 3.1 MG/24HR TD PTCH: MEDICATED_PATCH | TRANSDERMAL | 4 refills | Status: DC

## 2019-10-09 ENCOUNTER — Telehealth: Payer: Self-pay | Admitting: Family Medicine

## 2019-10-09 ENCOUNTER — Telehealth: Payer: Self-pay | Admitting: *Deleted

## 2019-10-09 ENCOUNTER — Other Ambulatory Visit: Payer: Self-pay

## 2019-10-09 ENCOUNTER — Other Ambulatory Visit (INDEPENDENT_AMBULATORY_CARE_PROVIDER_SITE_OTHER): Payer: Medicare Other

## 2019-10-09 DIAGNOSIS — M549 Dorsalgia, unspecified: Secondary | ICD-10-CM

## 2019-10-09 DIAGNOSIS — G8929 Other chronic pain: Secondary | ICD-10-CM

## 2019-10-09 DIAGNOSIS — E032 Hypothyroidism due to medicaments and other exogenous substances: Secondary | ICD-10-CM

## 2019-10-09 DIAGNOSIS — I10 Essential (primary) hypertension: Secondary | ICD-10-CM

## 2019-10-09 DIAGNOSIS — Z79899 Other long term (current) drug therapy: Secondary | ICD-10-CM | POA: Diagnosis not present

## 2019-10-09 LAB — T4, FREE: Free T4: 1.14 ng/dL (ref 0.60–1.60)

## 2019-10-09 LAB — TSH: TSH: 0.12 u[IU]/mL — ABNORMAL LOW (ref 0.35–4.50)

## 2019-10-09 LAB — T3, FREE: T3, Free: 3.3 pg/mL (ref 2.3–4.2)

## 2019-10-09 NOTE — Telephone Encounter (Signed)
Thank you :)

## 2019-10-09 NOTE — Telephone Encounter (Signed)
Pt came in for labs today and said she was due for a UDS but no orders were in Epic.  Please place orders if appropriate.

## 2019-10-09 NOTE — Addendum Note (Signed)
Addended by: Kelle Darting A on: 10/09/2019 02:37 PM   Modules accepted: Orders

## 2019-10-09 NOTE — Telephone Encounter (Signed)
Per 04/15/19 FYI note, repeat UDS due 3 to 6 months. Order placed.

## 2019-10-10 ENCOUNTER — Encounter: Payer: Self-pay | Admitting: Family Medicine

## 2019-10-10 MED ORDER — DIPHENHYDRAMINE HCL 12.5 MG/5ML PO LIQD: ORAL | 1 refills | Status: DC

## 2019-10-12 LAB — PAIN MGMT, PROFILE 8 W/CONF, U
6 Acetylmorphine: NEGATIVE ng/mL
Alcohol Metabolites: NEGATIVE ng/mL (ref <500)
Alphahydroxyalprazolam: 413 ng/mL
Alphahydroxymidazolam: NEGATIVE ng/mL
Alphahydroxytriazolam: NEGATIVE ng/mL
Aminoclonazepam: NEGATIVE ng/mL
Amphetamines: NEGATIVE ng/mL
Benzodiazepines: POSITIVE ng/mL
Buprenorphine, Urine: NEGATIVE ng/mL
Cocaine Metabolite: NEGATIVE ng/mL
Codeine: NEGATIVE ng/mL
Creatinine: 74.4 mg/dL
Hydrocodone: NEGATIVE ng/mL
Hydromorphone: NEGATIVE ng/mL
Hydroxyethylflurazepam: NEGATIVE ng/mL
Lorazepam: NEGATIVE ng/mL
MDMA: NEGATIVE ng/mL
Marijuana Metabolite: NEGATIVE ng/mL
Morphine: NEGATIVE ng/mL
Nordiazepam: NEGATIVE ng/mL
Norhydrocodone: NEGATIVE ng/mL
Noroxycodone: 3156 ng/mL
Opiates: NEGATIVE ng/mL
Oxazepam: NEGATIVE ng/mL
Oxidant: NEGATIVE ug/mL
Oxycodone: 2049 ng/mL
Oxycodone: POSITIVE ng/mL
Oxymorphone: 1950 ng/mL
Temazepam: NEGATIVE ng/mL
pH: 6.1 (ref 4.5–9.0)

## 2019-10-16 ENCOUNTER — Other Ambulatory Visit: Payer: Self-pay | Admitting: Family Medicine

## 2019-10-16 ENCOUNTER — Other Ambulatory Visit: Payer: Self-pay

## 2019-10-16 ENCOUNTER — Ambulatory Visit (INDEPENDENT_AMBULATORY_CARE_PROVIDER_SITE_OTHER): Payer: Medicare Other | Admitting: Family Medicine

## 2019-10-16 DIAGNOSIS — D539 Nutritional anemia, unspecified: Secondary | ICD-10-CM

## 2019-10-16 DIAGNOSIS — E559 Vitamin D deficiency, unspecified: Secondary | ICD-10-CM

## 2019-10-16 DIAGNOSIS — E032 Hypothyroidism due to medicaments and other exogenous substances: Secondary | ICD-10-CM | POA: Diagnosis not present

## 2019-10-16 DIAGNOSIS — E876 Hypokalemia: Secondary | ICD-10-CM | POA: Diagnosis not present

## 2019-10-16 DIAGNOSIS — E785 Hyperlipidemia, unspecified: Secondary | ICD-10-CM

## 2019-10-16 DIAGNOSIS — D75839 Thrombocytosis, unspecified: Secondary | ICD-10-CM

## 2019-10-16 DIAGNOSIS — D473 Essential (hemorrhagic) thrombocythemia: Secondary | ICD-10-CM

## 2019-10-16 DIAGNOSIS — I10 Essential (primary) hypertension: Secondary | ICD-10-CM

## 2019-10-16 MED ORDER — AMOXICILLIN 500 MG PO CAPS: 500.0000 mg | ORAL_CAPSULE | Freq: Three times a day (TID) | ORAL | 0 refills | Status: DC

## 2019-10-16 MED FILL — BD TB SYRINGE 27GX1/2: 27G X 1/2" | 28 days supply | Qty: 4 | Fill #1

## 2019-10-16 MED FILL — PEGASYS 180 MCG/ML VIAL: 180 | 28 days supply | Qty: 4 | Fill #6

## 2019-10-16 NOTE — Progress Notes (Signed)
Virtual Visit via phone Note  I connected with Rachael Johnson on 10/16/19 at  2:20 PM EDT by a phone enabled telemedicine application and verified that I am speaking with the correct person using two identifiers.  Location: Patient: home Provider: home   I discussed the limitations of evaluation and management by telemedicine and the availability of in person appointments. The patient expressed understanding and agreed to proceed. Marin Roberts CMA was able to get the patient set up on a visit, phone after being unable to set up a video visit   Subjective:    Patient ID: Rachael Johnson, female    DOB: 05-24-1951, 69 y.o.   MRN: [id number]  No chief complaint on file.   HPI Patient is in today for follow up on chronic medical concerns. No recent febrile illness or hospitalizations. She continues to live with family and maintain quarantine well. They have arranged for her to get her COVID shots soon. No recent acute concerns. Denies CP/palp/SOB/HA/congestion/fevers/GI or GU c/o. Taking meds as prescribed  Past Medical History:  Diagnosis Date  . Acute renal insufficiency 04/09/2017  . Anemia, iron deficiency 07/08/2012  . Arthritis   . Cataract 12/21/2014  . COPD (chronic obstructive pulmonary disease) ([place]) 01/23/2017   01/23/2017   Walked RA  2 laps @ 185 ft each stopped due to  Cough > > sob/ no desat nl pace  . Depression with anxiety 03/24/2011  . Diverticulitis   . Esophageal reflux 08/17/2013  . Essential thrombocythemia ([place]) 01/24/2011  . Frequent episodic tension-type headache   . Gastroenteritis 12/21/2014  . 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 GERD (gastroesophageal reflux disease)   . H. pylori infection 12/19/2012  . Hyperglycemia 12/17/2015  . Hypertension   . Iron deficiency anemia due to chronic blood loss 04/03/2017  . Osteoarthritis 05/24/2017  . Other and unspecified hyperlipidemia 02/25/2013  . Restless leg syndrome 09/30/2014  . Rhabdomyolysis 02/2017  . Scabies 03/28/2015  .  Secondary myelofibrosis ([place]) 11/29/2017  . Seizure ([place])    childhood  . Shoulder wound, right, sequela 03/14/2017  . Thyroid disease     Past Surgical History:  Procedure Laterality Date  . ABDOMINAL HYSTERECTOMY     1992  . BREAST BIOPSY    . BREAST CYST ASPIRATION    . BREAST SURGERY  1992   biopsy, benign. Fibrocystic  . UMBILICAL HERNIA REPAIR N/A 09/25/2012   Procedure: HERNIA REPAIR UMBILICAL ADULT;  Surgeon: Harl Bowie, MD;  Location: [facility];  Service: General;  Laterality: N/A;    Family History  Problem Relation Age of Onset  . Arthritis Mother   . Cancer Mother        ovarian  . Hypertension Mother   . Heart disease Mother        pacer  . Heart failure Mother   . Arthritis Father   . Cancer Father        lung  . Hypertension Father   . Cancer Sister        lung  . Cancer Brother        prostate  . Cancer Brother        lung  . Hypertension Son   . COPD Brother   . Heart disease Brother        died from CHF  . Hypertension Brother   . Cancer Brother        colon  . Alcohol abuse Brother   . Cirrhosis Brother   . Seizures  Sister   . Stroke Sister   . Arthritis Brother     Social History   Socioeconomic History  . Marital status: Widowed    Spouse name: Not on file  . Number of children: 2  . Years of education: Not on file  . Highest education level: Not on file  Occupational History  . Occupation: APL IT sales professional: [employer]    Comment: [employer]  Tobacco Use  . Smoking status: Former Smoker    Packs/day: 3.50    Years: 20.00    Pack years: 70.00    Types: Cigarettes    Start date: 11/15/1970    Quit date: 07/31/1990    Years since quitting: 29.2  . Smokeless tobacco: Never Used  . Tobacco comment: quit 23 years ago  Substance and Sexual Activity  . Alcohol use: No    Alcohol/week: 0.0 standard drinks  . Drug use: No  . Sexual activity: Never  Other Topics Concern  . Not on file  Social History Narrative   Regular  exercise: no   Caffeine Use: 2-3 weekly   Social Determinants of Health   Financial Resource Strain:   . Difficulty of Paying Living Expenses:   Food Insecurity:   . Worried About Charity fundraiser in the Last Year:   . Arboriculturist in the Last Year:   Transportation Needs:   . Film/video editor (Medical):   Marland Kitchen Lack of Transportation (Non-Medical):   Physical Activity:   . Days of Exercise per Week:   . Minutes of Exercise per Session:   Stress:   . Feeling of Stress :   Social Connections:   . Frequency of Communication with Friends and Family:   . Frequency of Social Gatherings with Friends and Family:   . Attends Religious Services:   . Active Member of Clubs or Organizations:   . Attends Archivist Meetings:   Marland Kitchen Marital Status:   Intimate Partner Violence:   . Fear of Current or Ex-Partner:   . Emotionally Abused:   Marland Kitchen Physically Abused:   . Sexually Abused:     Outpatient Medications Prior to Visit  Medication Sig Dispense Refill  . albuterol (VENTOLIN HFA) 108 (90 Base) MCG/ACT inhaler INHALE 2 PUFFS BY MOUTH EVERY 6 HOURS AS NEEDED FOR WHEEZING OR SHORTNESS OF BREATH 54 g 0  . ALLER-CHLOR 4 MG tablet TAKE 2 TABLETS BY MOUTH EVERY NIGHT AT BEDTIME AS NEEDED FOR ALLERGIES 30 tablet 1  . ALPRAZolam (XANAX) 1 MG tablet TAKE 1 TABLET BY MOUTH THREE TIMES DAILY AS NEEDED FOR ANXIETY 90 tablet 4  . atorvastatin (LIPITOR) 10 MG tablet Take 1 tablet (10 mg total) by mouth daily. 90 tablet 3  . budesonide-formoterol (SYMBICORT) 80-4.5 MCG/ACT inhaler Inhale 2 puffs into the lungs 2 (two) times daily. 30.6 g 1  . busPIRone (BUSPAR) 15 MG tablet TAKE 1 TABLET BY MOUTH FOUR TIMES DAILY AS NEEDED 120 tablet 4  . Cranberry 300 MG tablet Take 300 mg by mouth 2 (two) times daily.    . cyclobenzaprine (FLEXERIL) 10 MG tablet TAKE 1 TABLET BY MOUTH TWICE DAILY AS NEEDED FOR MUSCLE SPASM 60 tablet 0  . diphenhydrAMINE (EQ ALLERGY RELIEF CHILDRENS) 12.5 MG/5ML liquid  TAKE 5 ML BY MOUTH THREE TIMES DAILY AS NEEDED FOR MOUTH  PAIN 240 mL 1  . estradiol (ESTRACE) 0.5 MG tablet Take 1 tablet (0.5 mg total) by mouth 2 (two) times daily. 180 tablet  1  . famotidine (PEPCID) 20 MG tablet TAKE 2 TABLETS BY MOUTH ONCE DAILY 60 tablet 0  . famotidine (PEPCID) 40 MG tablet Take 1 tablet (40 mg total) by mouth daily. 90 tablet 1  . fluconazole (DIFLUCAN) 150 MG tablet TAKE 1 TABLET BY MOUTH ONCE WEEKLY 2 tablet 10  . fluticasone (FLONASE) 50 MCG/ACT nasal spray INSTILL TWO (2) SPRAYS IN EACH NOSTRIL ONCE DAILY 16 g 2  . furosemide (LASIX) 20 MG tablet TAKE 1 TABLET BY MOUTH 3 TIMES DAILY AS NEEDED FOR EDEMA 90 tablet 10  . gabapentin (NEURONTIN) 100 MG capsule TAKE FOUR (4) CAPSULES BY MOUTH EVERY NIGHT AT BEDTIME 120 capsule 10  . gabapentin (NEURONTIN) 400 MG capsule Take 1 capsule (400 mg total) by mouth daily. 90 capsule 1  . granisetron (SANCUSO) 3.1 MG/24HR APPLY 1 PATCH TOPICALLY AS NEEDED FOR NAUSEA, REMOVE AFTER 7 DAYS 4 patch 4  . levothyroxine (EUTHYROX) 100 MCG tablet TAKE 1 TABLET BY MOUTH ONCE DAILY BEFORE BREAKFAST 90 tablet 1  . magic mouthwash SOLN Take 5 mLs by mouth 3 (three) times daily as needed for mouth pain. Components benadryl  525 mg, hydrocortisone 60 mg and nystatin 0.6 mg. 240 ml - Oral 240 mL 3  . meclizine (ANTIVERT) 12.5 MG tablet TAKE 1 TABLET BY MOUTH THREE TIMES DAILY AS NEEDED FOR DIZZINESS 40 tablet 10  . montelukast (SINGULAIR) 10 MG tablet Take 1 tablet (10 mg total) by mouth at bedtime. 90 tablet 2  . Multiple Vitamin (MULTIVITAMIN PO) Take 1 tablet by mouth every morning.     . nystatin (MYCOSTATIN) 100000 UNIT/ML suspension Take 5 mLs (500,000 Units total) by mouth 4 (four) times daily. 60 mL 3  . ondansetron (ZOFRAN) 8 MG tablet TAKE 1/2-1 TABLET BY MOUTH EVERY 8 HOURS AS NEEDED FOR NAUSEA AND VOMITING 90 tablet 10  . oxyCODONE-acetaminophen (PERCOCET) 10-325 MG tablet Take 1 tablet by mouth every 6 (six) hours as needed for pain.  120 tablet 0  . peginterferon alfa-2a (PEGASYS) 180 MCG/ML injection Inject 0.5 mLs (90 mcg total) into the skin every 7 (seven) days. 4 mL 6  . RESTASIS 0.05 % ophthalmic emulsion PLACE ONE DROP INTO EACH EYE EVERY 12 HOURS 360 each 3  . Spacer/Aero-Holding Chambers (OPTICHAMBER DIAMOND) MISC USE as directed. Use with inhaler 1 each 0  . SSD 1 % cream APPLY TOPICALLY DAILY 50 g 0  . traZODone (DESYREL) 100 MG tablet TAKE 1 TABLET BY MOUTH AT BEDTIME 30 tablet 10  . TUBERCULIN SYR 1CC/27GX1/2" (B-D TB SYRINGE 1CC/27GX1/2") 27G X 1/2" 1 ML MISC USE AS DIRECTED ONCE WEEKLY FOR PEG-INTERFERON INJECTIONS 4 each 11  . venlafaxine XR (EFFEXOR-XR) 75 MG 24 hr capsule TAKE 1 CAPSULE BY MOUTH THREE TIMES DAILY 90 capsule 10  . amoxicillin (AMOXIL) 500 MG capsule Take 1 capsule (500 mg total) by mouth 3 (three) times daily. 30 capsule 0   No facility-administered medications prior to visit.    No Known Allergies  Review of Systems  Constitutional: Positive for malaise/fatigue. Negative for fever.  HENT: Negative for congestion.   Eyes: Negative for blurred vision.  Respiratory: Negative for shortness of breath.   Cardiovascular: Negative for chest pain, palpitations and leg swelling.  Gastrointestinal: Negative for abdominal pain, blood in stool and nausea.  Genitourinary: Negative for dysuria and frequency.  Musculoskeletal: Positive for back pain. Negative for falls.  Skin: Negative for rash.  Neurological: Negative for dizziness, loss of consciousness and headaches.  Endo/Heme/Allergies: Negative for  environmental allergies.  Psychiatric/Behavioral: Negative for depression. The patient is nervous/anxious.        Objective:    Physical Exam unable to obtain via phone visit  LMP 07/31/1990  Wt Readings from Last 3 Encounters:  09/30/19 147 lb 1.9 oz (66.7 kg)  08/22/19 142 lb (64.4 kg)  08/19/19 154 lb (69.9 kg)    Diabetic Foot Exam - Simple   No data filed     Lab Results    Component Value Date   WBC 4.8 09/30/2019   HGB 11.9 (L) 09/30/2019   HCT 36.5 09/30/2019   PLT 717 (H) 09/30/2019   GLUCOSE 77 09/30/2019   CHOL 236 (H) 05/06/2018   TRIG 322.0 (H) 05/06/2018   HDL 59.00 05/06/2018   LDLDIRECT 95.0 05/06/2018   LDLCALC 82 02/12/2017   ALT 30 09/30/2019   AST 37 09/30/2019   NA 141 09/30/2019   K 3.1 (L) 09/30/2019   CL 102 09/30/2019   CREATININE 0.87 09/30/2019   BUN 16 09/30/2019   CO2 30 09/30/2019   TSH 0.12 (L) 10/09/2019   INR 0.94 09/16/2018   HGBA1C 5.0 08/10/2017    Lab Results  Component Value Date   TSH 0.12 (L) 10/09/2019   Lab Results  Component Value Date   WBC 4.8 09/30/2019   HGB 11.9 (L) 09/30/2019   HCT 36.5 09/30/2019   MCV 104.0 (H) 09/30/2019   PLT 717 (H) 09/30/2019   Lab Results  Component Value Date   NA 141 09/30/2019   K 3.1 (L) 09/30/2019   CHLORIDE 105 11/29/2016   CO2 30 09/30/2019   GLUCOSE 77 09/30/2019   BUN 16 09/30/2019   CREATININE 0.87 09/30/2019   BILITOT 0.4 09/30/2019   ALKPHOS 110 09/30/2019   AST 37 09/30/2019   ALT 30 09/30/2019   PROT 8.1 09/30/2019   ALBUMIN 4.3 09/30/2019   CALCIUM 9.3 09/30/2019   ANIONGAP 9 09/30/2019   EGFR 80 (L) 11/29/2016   GFR 62.50 05/06/2018   Lab Results  Component Value Date   CHOL 236 (H) 05/06/2018   Lab Results  Component Value Date   HDL 59.00 05/06/2018   Lab Results  Component Value Date   LDLCALC 82 02/12/2017   Lab Results  Component Value Date   TRIG 322.0 (H) 05/06/2018   Lab Results  Component Value Date   CHOLHDL 4 05/06/2018   Lab Results  Component Value Date   HGBA1C 5.0 08/10/2017       Assessment & Plan:   Problem List Items Addressed This Visit    Hypothyroid    On Levothyroxine, continue to monitor      Macrocytic anemia    Check a vitamin B 12 leel and intrinsic factor       Vitamin D deficiency    Supplement and monitor      Essential hypertension    Monitor and report any concerns. no  changes to meds. Encouraged heart healthy diet such as the DASH diet and exercise as tolerated.       Hypomagnesemia    Supplement and onitor      Hypokalemia    Increased intake and monitor         I have discontinued Deitra D. Presutti's amoxicillin. I am also having her maintain her Multiple Vitamin (MULTIVITAMIN PO), Cranberry, SSD, magic mouthwash, montelukast, cyclobenzaprine, optichamber diamond, atorvastatin, estradiol, levothyroxine, famotidine, gabapentin, albuterol, Restasis, budesonide-formoterol, nystatin, Aller-Chlor, fluconazole, busPIRone, Pegasys, ondansetron, fluticasone, famotidine, TUBERCULIN SYR 1CC/27GX1/2", furosemide, venlafaxine XR,  ALPRAZolam, gabapentin, meclizine, traZODone, oxyCODONE-acetaminophen, Sancuso, and diphenhydrAMINE.  No orders of the defined types were placed in this encounter.    I discussed the assessment and treatment plan with the patient. The patient was provided an opportunity to ask questions and all were answered. The patient agreed with the plan and demonstrated an understanding of the instructions.   The patient was advised to call back or seek an in-person evaluation if the symptoms worsen or if the condition fails to improve as anticipated.  I provided 25 minutes of non-face-to-face time during this encounter.   Penni Homans, MD

## 2019-10-16 NOTE — Assessment & Plan Note (Signed)
Supplement and monitor 

## 2019-10-16 NOTE — Assessment & Plan Note (Signed)
Check a vitamin B 12 leel and intrinsic factor

## 2019-10-16 NOTE — Assessment & Plan Note (Signed)
Supplement and onitor

## 2019-10-16 NOTE — Assessment & Plan Note (Signed)
On Levothyroxine, continue to monitor 

## 2019-10-16 NOTE — Assessment & Plan Note (Signed)
Increased intake and monitor

## 2019-10-16 NOTE — Assessment & Plan Note (Signed)
Monitor and report any concerns. no changes to meds. Encouraged heart healthy diet such as the DASH diet and exercise as tolerated.  

## 2019-10-20 ENCOUNTER — Other Ambulatory Visit: Payer: Self-pay | Admitting: Family Medicine

## 2019-10-20 DIAGNOSIS — G8929 Other chronic pain: Secondary | ICD-10-CM

## 2019-10-20 NOTE — Progress Notes (Signed)
Nurse connected with patient 10/21/19 at  1:45 PM EDT by a telephone enabled telemedicine application and verified that I am speaking with the correct person using two identifiers. Patient stated full name and DOB. Patient gave permission to continue with virtual visit. Patient's location was at home and Nurse's location was at [place] office.   Subjective:   Rachael Johnson is a 69 y.o. female who presents for an Initial Medicare Annual Wellness Visit.  Review of Systems    Home Safety/Smoke Alarms: Feels safe in home. Smoke alarms in place.  Lives alone with dog. 1 story home. Pt states she has a great support system.   Female:     Mammo-  04/05/19     Dexa scan-  ordered   CCS- 11/29/10     Objective:     Advanced Directives 10/21/2019 09/30/2019 08/19/2019 07/21/2019 05/13/2019 04/15/2019 03/17/2019  Does Patient Have a Medical Advance Directive? Yes Yes Yes Yes Yes Yes Yes  Type of Paramedic of [place];Living will [place];Living will [place];Living will [place];Living will [place];Living will - Living will;Healthcare Power of Attorney  Does patient want to make changes to medical advance directive? No - Patient declined No - Patient declined No - Patient declined - No - Patient declined - No - Patient declined  Copy of [place] in Chart? No - copy requested No - copy requested No - copy requested - No - copy requested - No - copy requested  Would patient like information on creating a medical advance directive? - No - Patient declined No - Patient declined No - Patient declined No - Patient declined - -  Pre-existing out of facility DNR order (yellow form or pink MOST form) - - - - - - -    Current Medications (verified) Outpatient Encounter Medications as of 10/21/2019  Medication Sig  . albuterol (VENTOLIN HFA) 108 (90 Base) MCG/ACT inhaler INHALE 2 PUFFS BY  MOUTH EVERY 6 HOURS AS NEEDED FOR WHEEZING OR SHORTNESS OF BREATH  . ALLER-CHLOR 4 MG tablet TAKE 2 TABLETS BY MOUTH EVERY NIGHT AT BEDTIME AS NEEDED FOR ALLERGIES  . ALPRAZolam (XANAX) 1 MG tablet TAKE 1 TABLET BY MOUTH THREE TIMES DAILY AS NEEDED FOR ANXIETY  . amoxicillin (AMOXIL) 500 MG capsule Take 1 capsule (500 mg total) by mouth 3 (three) times daily.  Marland Kitchen atorvastatin (LIPITOR) 10 MG tablet Take 1 tablet (10 mg total) by mouth daily.  . budesonide-formoterol (SYMBICORT) 80-4.5 MCG/ACT inhaler Inhale 2 puffs into the lungs 2 (two) times daily.  . busPIRone (BUSPAR) 15 MG tablet TAKE 1 TABLET BY MOUTH FOUR TIMES DAILY AS NEEDED  . Cranberry 300 MG tablet Take 300 mg by mouth 2 (two) times daily.  . cyclobenzaprine (FLEXERIL) 10 MG tablet TAKE 1 TABLET BY MOUTH TWICE DAILY AS NEEDED FOR MUSCLE SPASMS  . diphenhydrAMINE (EQ ALLERGY RELIEF CHILDRENS) 12.5 MG/5ML liquid TAKE 5 ML BY MOUTH THREE TIMES DAILY AS NEEDED FOR MOUTH  PAIN  . estradiol (ESTRACE) 0.5 MG tablet Take 1 tablet (0.5 mg total) by mouth 2 (two) times daily.  . famotidine (PEPCID) 40 MG tablet Take 1 tablet (40 mg total) by mouth daily.  . fluconazole (DIFLUCAN) 150 MG tablet TAKE 1 TABLET BY MOUTH ONCE WEEKLY  . fluticasone (FLONASE) 50 MCG/ACT nasal spray INSTILL TWO (2) SPRAYS IN EACH NOSTRIL ONCE DAILY  . furosemide (LASIX) 20 MG tablet TAKE 1 TABLET BY  MOUTH 3 TIMES DAILY AS NEEDED FOR EDEMA  . gabapentin (NEURONTIN) 100 MG capsule TAKE FOUR (4) CAPSULES BY MOUTH EVERY NIGHT AT BEDTIME  . gabapentin (NEURONTIN) 400 MG capsule Take 1 capsule (400 mg total) by mouth daily.  Marland Kitchen granisetron (SANCUSO) 3.1 MG/24HR APPLY 1 PATCH TOPICALLY AS NEEDED FOR NAUSEA, REMOVE AFTER 7 DAYS  . levothyroxine (EUTHYROX) 100 MCG tablet TAKE 1 TABLET BY MOUTH ONCE DAILY BEFORE BREAKFAST  . magic mouthwash SOLN Take 5 mLs by mouth 3 (three) times daily as needed for mouth pain. Components benadryl  525 mg, hydrocortisone 60 mg and nystatin 0.6 mg.  240 ml - Oral  . meclizine (ANTIVERT) 12.5 MG tablet TAKE 1 TABLET BY MOUTH THREE TIMES DAILY AS NEEDED FOR DIZZINESS  . montelukast (SINGULAIR) 10 MG tablet Take 1 tablet (10 mg total) by mouth at bedtime.  . Multiple Vitamin (MULTIVITAMIN PO) Take 1 tablet by mouth every morning.   . nystatin (MYCOSTATIN) 100000 UNIT/ML suspension Take 5 mLs (500,000 Units total) by mouth 4 (four) times daily.  . ondansetron (ZOFRAN) 8 MG tablet TAKE 1/2-1 TABLET BY MOUTH EVERY 8 HOURS AS NEEDED FOR NAUSEA AND VOMITING  . oxyCODONE-acetaminophen (PERCOCET) 10-325 MG tablet Take 1 tablet by mouth every 6 (six) hours as needed for pain.  . peginterferon alfa-2a (PEGASYS) 180 MCG/ML injection Inject 0.5 mLs (90 mcg total) into the skin every 7 (seven) days.  . RESTASIS 0.05 % ophthalmic emulsion PLACE ONE DROP INTO EACH EYE EVERY 12 HOURS  . Spacer/Aero-Holding Chambers [facility] DIAMOND) MISC USE as directed. Use with inhaler  . SSD 1 % cream APPLY TOPICALLY DAILY  . traZODone (DESYREL) 100 MG tablet TAKE 1 TABLET BY MOUTH AT BEDTIME  . TUBERCULIN SYR 1CC/27GX1/2" (B-D TB SYRINGE 1CC/27GX1/2") 27G X 1/2" 1 ML MISC USE AS DIRECTED ONCE WEEKLY FOR PEG-INTERFERON INJECTIONS  . venlafaxine XR (EFFEXOR-XR) 75 MG 24 hr capsule TAKE 1 CAPSULE BY MOUTH THREE TIMES DAILY  . [DISCONTINUED] cyclobenzaprine (FLEXERIL) 10 MG tablet TAKE 1 TABLET BY MOUTH TWICE DAILY AS NEEDED FOR MUSCLE SPASM  . [DISCONTINUED] famotidine (PEPCID) 20 MG tablet TAKE 2 TABLETS BY MOUTH ONCE DAILY   No facility-administered encounter medications on file as of 10/21/2019.    Allergies (verified) Patient has no known allergies.   History: Past Medical History:  Diagnosis Date  . Acute renal insufficiency 04/09/2017  . Anemia, iron deficiency 07/08/2012  . Arthritis   . Cataract 12/21/2014  . COPD (chronic obstructive pulmonary disease) ([place]) 01/23/2017   01/23/2017   Walked RA  2 laps @ 185 ft each stopped due to  Cough > > sob/ no desat nl  pace  . Depression with anxiety 03/24/2011  . Diverticulitis   . Esophageal reflux 08/17/2013  . Essential thrombocythemia ([place]) 01/24/2011  . Frequent episodic tension-type headache   . Gastroenteritis 12/21/2014  . 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 GERD (gastroesophageal reflux disease)   . H. pylori infection 12/19/2012  . Hyperglycemia 12/17/2015  . Hypertension   . Iron deficiency anemia due to chronic blood loss 04/03/2017  . Osteoarthritis 05/24/2017  . Other and unspecified hyperlipidemia 02/25/2013  . Restless leg syndrome 09/30/2014  . Rhabdomyolysis 02/2017  . Scabies 03/28/2015  . 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 Seizure ([place])    childhood  . Shoulder wound, right, sequela 03/14/2017  . Thyroid disease    Past Surgical History:  Procedure Laterality Date  . ABDOMINAL HYSTERECTOMY     1992  . BREAST BIOPSY    .  BREAST CYST ASPIRATION    . BREAST SURGERY  1992   biopsy, benign. Fibrocystic  . UMBILICAL HERNIA REPAIR N/A 09/25/2012   Procedure: HERNIA REPAIR UMBILICAL ADULT;  Surgeon: Harl Bowie, MD;  Location: [facility];  Service: General;  Laterality: N/A;   Family History  Problem Relation Age of Onset  . Arthritis Mother   . Cancer Mother        ovarian  . Hypertension Mother   . Heart disease Mother        pacer  . Heart failure Mother   . Arthritis Father   . Cancer Father        lung  . Hypertension Father   . Cancer Sister        lung  . Cancer Brother        prostate  . Cancer Brother        lung  . Hypertension Son   . COPD Brother   . Heart disease Brother        died from CHF  . Hypertension Brother   . Cancer Brother        colon  . Alcohol abuse Brother   . Cirrhosis Brother   . Seizures Sister   . Stroke Sister   . Arthritis Brother    Social History   Socioeconomic History  . Marital status: Widowed    Spouse name: Not on file  . Number of children: 2  . Years of education: Not on file  . Highest education level: Not on file   Occupational History  . Occupation: APL IT sales professional: [employer]    Comment: cotton [employer]  Tobacco Use  . Smoking status: Former Smoker    Packs/day: 3.50    Years: 20.00    Pack years: 70.00    Types: Cigarettes    Start date: 11/15/1970    Quit date: 07/31/1990    Years since quitting: 29.2  . Smokeless tobacco: Never Used  . Tobacco comment: quit 23 years ago  Substance and Sexual Activity  . Alcohol use: No    Alcohol/week: 0.0 standard drinks  . Drug use: No  . Sexual activity: Never  Other Topics Concern  . Not on file  Social History Narrative   Regular exercise: no   Caffeine Use: 2-3 weekly   Social Determinants of Health   Financial Resource Strain:   . Difficulty of Paying Living Expenses:   Food Insecurity:   . Worried About Charity fundraiser in the Last Year:   . Arboriculturist in the Last Year:   Transportation Needs:   . Film/video editor (Medical):   Marland Kitchen Lack of Transportation (Non-Medical):   Physical Activity:   . Days of Exercise per Week:   . Minutes of Exercise per Session:   Stress:   . Feeling of Stress :   Social Connections:   . Frequency of Communication with Friends and Family:   . Frequency of Social Gatherings with Friends and Family:   . Attends Religious Services:   . Active Member of Clubs or Organizations:   . Attends Archivist Meetings:   Marland Kitchen Marital Status:     Tobacco Counseling Counseling given: Not Answered Comment: quit 23 years ago   Clinical Intake: Pain : No/denies pain    Activities of Daily Living In your present state of health, do you have any difficulty performing the following activities: 10/21/2019  Hearing? N  Vision? N  Difficulty concentrating or making decisions? N  Walking or climbing stairs? N  Dressing or bathing? N  Doing errands, shopping? N  Preparing Food and eating ? N  Using the Toilet? N  In the past six months, have you accidently leaked urine? N  Do you have  problems with loss of bowel control? N  Managing your Medications? N  Managing your Finances? N  Housekeeping or managing your Housekeeping? N  Some recent data might be hidden     Immunizations and Health Maintenance Immunization History  Administered Date(s) Administered  . Influenza Whole 05/16/2013  . Influenza, High Dose Seasonal PF 04/30/2017  . Influenza,inj,Quad PF,6+ Mos 04/02/2015, 04/18/2016, 05/13/2019  . Influenza-Unspecified 05/31/2014, 03/01/2018  . Pneumococcal Conjugate-13 04/04/2013, 05/13/2019, 05/14/2019  . Pneumococcal Polysaccharide-23 04/18/2016  . Pneumococcal-Unspecified 04/10/2011  . Td 08/01/2007  . Tdap 07/06/2014  . Zoster 04/28/2013   Health Maintenance Due  Topic Date Due  . Hepatitis C Screening  Never done    Patient Care Team: Mosie Lukes, MD as PCP - General (Family Medicine) Marin Olp Rudell Cobb, MD as Consulting Physician (Oncology) Glenna Fellows, MD as Attending Physician (Neurosurgery)  Indicate any recent Medical Services you may have received from other than [facility] providers in the past year (date may be approximate).     Assessment:   This is a routine wellness examination for Marijean. Physical assessment deferred to PCP.  Hearing/Vision screen Unable to assess. This visit is enabled though telemedicine due to Covid 19.   Dietary issues and exercise activities discussed: Current Exercise Habits: The patient does not participate in regular exercise at present, Exercise limited by: None identified Diet (meal preparation, eat out, water intake, caffeinated beverages, dairy products, fruits and vegetables): well balanced    Goals    . Maintain health and independence      Depression Screen PHQ 2/9 Scores 10/21/2019 05/14/2018 04/09/2017 11/14/2013  PHQ - 2 Score 0 0 0 0  PHQ- 9 Score - - 0 -    Fall Risk Fall Risk  10/21/2019 06/20/2019 05/14/2018 04/09/2017 04/05/2017  Falls in the past year? 1 0 No Yes Yes  Comment - Emmi Telephone  Survey: data to providers prior to load - - -  Number falls in past yr: 0 - - 1 2 or more  Injury with Fall? 0 - - Yes Yes  Risk Factor Category  - - - - High Fall Risk  Follow up Education provided;Falls prevention discussed - - - -   Cognitive Function: Ad8 score reviewed for issues:  Issues making decisions:no  Less interest in hobbies / activities:no  Repeats questions, stories (family complaining):no  Trouble using ordinary gadgets (microwave, computer, phone):no  Forgets the month or year: no  Mismanaging finances: no  Remembering appts:no  Daily problems with thinking and/or memory:no Ad8 score is=0        Screening Tests Health Maintenance  Topic Date Due  . Hepatitis C Screening  Never done  . COLONOSCOPY  11/28/2020  . MAMMOGRAM  04/14/2021  . PNA vac Low Risk Adult (2 of 2 - PPSV23) 04/18/2021  . TETANUS/TDAP  07/06/2024  . INFLUENZA VACCINE  Completed  . DEXA SCAN  Completed      Plan:    Please schedule your next medicare wellness visit with me in 1 yr.  Continue to eat heart healthy diet (full of fruits, vegetables, whole grains, lean protein, water--limit salt, fat, and sugar intake) and increase physical activity as tolerated.  Continue doing brain  stimulating activities (puzzles, reading, adult coloring books, staying active) to keep memory sharp.   Bring a copy of your living will and/or healthcare power of attorney to your next office visit.  I have ordered your bone density scan.   I have personally reviewed and noted the following in the patient's chart:   . Medical and social history . Use of alcohol, tobacco or illicit drugs  . Current medications and supplements . Functional ability and status . Nutritional status . Physical activity . Advanced directives . List of other physicians . Hospitalizations, surgeries, and ER visits in previous 12 months . Vitals . Screenings to include cognitive, depression, and falls . Referrals and  appointments  In addition, I have reviewed and discussed with patient certain preventive protocols, quality metrics, and best practice recommendations. A written personalized care plan for preventive services as well as general preventive health recommendations were provided to patient.     Naaman Plummer [place], [place]   10/21/2019

## 2019-10-21 ENCOUNTER — Ambulatory Visit (INDEPENDENT_AMBULATORY_CARE_PROVIDER_SITE_OTHER): Payer: Medicare Other | Admitting: *Deleted

## 2019-10-21 ENCOUNTER — Other Ambulatory Visit: Payer: Self-pay

## 2019-10-21 ENCOUNTER — Encounter: Payer: Self-pay | Admitting: *Deleted

## 2019-10-21 DIAGNOSIS — Z0000 Encounter for general adult medical examination without abnormal findings: Secondary | ICD-10-CM | POA: Diagnosis not present

## 2019-10-21 DIAGNOSIS — Z780 Asymptomatic menopausal state: Secondary | ICD-10-CM

## 2019-10-21 NOTE — Patient Instructions (Signed)
Please schedule your next medicare wellness visit with me in 1 yr.  Continue to eat heart healthy diet (full of fruits, vegetables, whole grains, lean protein, water--limit salt, fat, and sugar intake) and increase physical activity as tolerated.  Continue doing brain stimulating activities (puzzles, reading, adult coloring books, staying active) to keep memory sharp.   Bring a copy of your living will and/or healthcare power of attorney to your next office visit.  I have ordered your bone density scan.    Rachael Johnson , Thank you for taking time to come for your Medicare Wellness Visit. I appreciate your ongoing commitment to your health goals. Please review the following plan we discussed and let me know if I can assist you in the future.   These are the goals we discussed: Goals    . Maintain health and independence       This is a list of the screening recommended for you and due dates:  Health Maintenance  Topic Date Due  .  Hepatitis C: One time screening is recommended by Center for Disease Control  (CDC) for  adults born from [birth year] through 1964.   Never done  . Colon Cancer Screening  11/28/2020  . Mammogram  04/14/2021  . Pneumonia vaccines (2 of 2 - PPSV23) 04/18/2021  . Tetanus Vaccine  07/06/2024  . Flu Shot  Completed  . DEXA scan (bone density measurement)  Completed    Preventive Care 65 Years and Older, Female Preventive care refers to lifestyle choices and visits with your health care provider that can promote health and wellness. This includes:  A yearly physical exam. This is also called an annual well check.  Regular dental and eye exams.  Immunizations.  Screening for certain conditions.  Healthy lifestyle choices, such as diet and exercise. What can I expect for my preventive care visit? Physical exam Your health care provider will check:  Height and weight. These may be used to calculate body mass index (BMI), which is a measurement that tells if  you are at a healthy weight.  Heart rate and blood pressure.  Your skin for abnormal spots. Counseling Your health care provider may ask you questions about:  Alcohol, tobacco, and drug use.  Emotional well-being.  Home and relationship well-being.  Sexual activity.  Eating habits.  History of falls.  Memory and ability to understand (cognition).  Work and work Statistician.  Pregnancy and menstrual history. What immunizations do I need?  Influenza (flu) vaccine  This is recommended every year. Tetanus, diphtheria, and pertussis (Tdap) vaccine  You may need a Td booster every 10 years. Varicella (chickenpox) vaccine  You may need this vaccine if you have not already been vaccinated. Zoster (shingles) vaccine  You may need this after age 71. Pneumococcal conjugate (PCV13) vaccine  One dose is recommended after age 68. Pneumococcal polysaccharide (PPSV23) vaccine  One dose is recommended after age 60. Measles, mumps, and rubella (MMR) vaccine  You may need at least one dose of MMR if you were born in 1957 or later. You may also need a second dose. Meningococcal conjugate (MenACWY) vaccine  You may need this if you have certain conditions. Hepatitis A vaccine  You may need this if you have certain conditions or if you travel or work in places where you may be exposed to hepatitis A. Hepatitis B vaccine  You may need this if you have certain conditions or if you travel or work in places where you may be exposed  to hepatitis B. Haemophilus influenzae type b (Hib) vaccine  You may need this if you have certain conditions. You may receive vaccines as individual doses or as more than one vaccine together in one shot (combination vaccines). Talk with your health care provider about the risks and benefits of combination vaccines. What tests do I need? Blood tests  Lipid and cholesterol levels. These may be checked every 5 years, or more frequently depending on  your overall health.  Hepatitis C test.  Hepatitis B test. Screening  Lung cancer screening. You may have this screening every year starting at age 61 if you have a 30-pack-year history of smoking and currently smoke or have quit within the past 15 years.  Colorectal cancer screening. All adults should have this screening starting at age 77 and continuing until age 3. Your health care provider may recommend screening at age 75 if you are at increased risk. You will have tests every 1-10 years, depending on your results and the type of screening test.  Diabetes screening. This is done by checking your blood sugar (glucose) after you have not eaten for a while (fasting). You may have this done every 1-3 years.  Mammogram. This may be done every 1-2 years. Talk with your health care provider about how often you should have regular mammograms.  BRCA-related cancer screening. This may be done if you have a family history of breast, ovarian, tubal, or peritoneal cancers. Other tests  Sexually transmitted disease (STD) testing.  Bone density scan. This is done to screen for osteoporosis. You may have this done starting at age 78. Follow these instructions at home: Eating and drinking  Eat a diet that includes fresh fruits and vegetables, whole grains, lean protein, and low-fat dairy products. Limit your intake of foods with high amounts of sugar, saturated fats, and salt.  Take vitamin and mineral supplements as recommended by your health care provider.  Do not drink alcohol if your health care provider tells you not to drink.  If you drink alcohol: ? Limit how much you have to 0-1 drink a day. ? Be aware of how much alcohol is in your drink. In the [place] one drink equals one 12 oz bottle of beer (355 mL), one 5 oz glass of wine (148 mL), or one 1 oz glass of hard liquor (44 mL). Lifestyle  Take daily care of your teeth and gums.  Stay active. Exercise for at least 30 minutes on 5 or  more days each week.  Do not use any products that contain nicotine or tobacco, such as cigarettes, e-cigarettes, and chewing tobacco. If you need help quitting, ask your health care provider.  If you are sexually active, practice safe sex. Use a condom or other form of protection in order to prevent STIs (sexually transmitted infections).  Talk with your health care provider about taking a low-dose aspirin or statin. What's next?  Go to your health care provider once a year for a well check visit.  Ask your health care provider how often you should have your eyes and teeth checked.  Stay up to date on all vaccines. This information is not intended to replace advice given to you by your health care provider. Make sure you discuss any questions you have with your health care provider. Document Revised: 07/11/2018 Document Reviewed: 07/11/2018 Elsevier Patient Education  2020 [employer].

## 2019-10-28 ENCOUNTER — Encounter: Payer: Self-pay | Admitting: Family Medicine

## 2019-10-29 ENCOUNTER — Other Ambulatory Visit (INDEPENDENT_AMBULATORY_CARE_PROVIDER_SITE_OTHER): Payer: Medicare Other

## 2019-10-29 ENCOUNTER — Ambulatory Visit (HOSPITAL_BASED_OUTPATIENT_CLINIC_OR_DEPARTMENT_OTHER)
Admission: RE | Admit: 2019-10-29 | Discharge: 2019-10-29 | Disposition: A | Discharge status: Home / Self Care | Payer: Medicare Other | Source: Ambulatory Visit | Attending: Family Medicine | Admitting: Family Medicine

## 2019-10-29 ENCOUNTER — Inpatient Hospital Stay: Payer: Medicare Other

## 2019-10-29 ENCOUNTER — Encounter: Payer: Self-pay | Admitting: Family Medicine

## 2019-10-29 ENCOUNTER — Inpatient Hospital Stay: Payer: Medicare Other | Admitting: Hematology & Oncology

## 2019-10-29 ENCOUNTER — Other Ambulatory Visit: Payer: Self-pay

## 2019-10-29 DIAGNOSIS — E559 Vitamin D deficiency, unspecified: Secondary | ICD-10-CM

## 2019-10-29 DIAGNOSIS — M85852 Other specified disorders of bone density and structure, left thigh: Secondary | ICD-10-CM | POA: Diagnosis not present

## 2019-10-29 DIAGNOSIS — Z780 Asymptomatic menopausal state: Secondary | ICD-10-CM | POA: Diagnosis not present

## 2019-10-29 DIAGNOSIS — D539 Nutritional anemia, unspecified: Secondary | ICD-10-CM | POA: Diagnosis not present

## 2019-10-29 DIAGNOSIS — E876 Hypokalemia: Secondary | ICD-10-CM | POA: Diagnosis not present

## 2019-10-29 LAB — COMPREHENSIVE METABOLIC PANEL
ALT: 26 U/L (ref 0–35)
AST: 32 U/L (ref 0–37)
Albumin: 4 g/dL (ref 3.5–5.2)
Alkaline Phosphatase: 97 U/L (ref 39–117)
BUN: 13 mg/dL (ref 6–23)
CO2: 32 mEq/L (ref 19–32)
Calcium: 8.9 mg/dL (ref 8.4–10.5)
Chloride: 102 mEq/L (ref 96–112)
Creatinine, Ser: 0.9 mg/dL (ref 0.40–1.20)
GFR: 62.31 mL/min (ref >60.00)
Glucose, Bld: 76 mg/dL (ref 70–99)
Potassium: 4 mEq/L (ref 3.5–5.1)
Sodium: 138 mEq/L (ref 135–145)
Total Bilirubin: 0.4 mg/dL (ref 0.2–1.2)
Total Protein: 7.1 g/dL (ref 6.0–8.3)

## 2019-10-29 LAB — VITAMIN D 25 HYDROXY (VIT D DEFICIENCY, FRACTURES): VITD: 37.89 ng/mL (ref 30.00–100.00)

## 2019-10-29 LAB — MAGNESIUM: Magnesium: 1.9 mg/dL (ref 1.5–2.5)

## 2019-10-29 LAB — VITAMIN B12: Vitamin B-12: 120 pg/mL — ABNORMAL LOW (ref 211–911)

## 2019-10-31 ENCOUNTER — Inpatient Hospital Stay: Payer: Medicare Other

## 2019-10-31 ENCOUNTER — Inpatient Hospital Stay (HOSPITAL_BASED_OUTPATIENT_CLINIC_OR_DEPARTMENT_OTHER): Payer: Medicare Other | Admitting: Hematology & Oncology

## 2019-10-31 ENCOUNTER — Inpatient Hospital Stay: Payer: Medicare Other | Attending: Hematology & Oncology

## 2019-10-31 ENCOUNTER — Other Ambulatory Visit: Payer: Self-pay

## 2019-10-31 ENCOUNTER — Encounter: Payer: Self-pay | Admitting: Hematology & Oncology

## 2019-10-31 VITALS — BP 155/99 | HR 97 | Temp 97.3°F | Resp 18 | Wt 147.0 lb

## 2019-10-31 DIAGNOSIS — D509 Iron deficiency anemia, unspecified: Secondary | ICD-10-CM | POA: Diagnosis not present

## 2019-10-31 DIAGNOSIS — D473 Essential (hemorrhagic) thrombocythemia: Secondary | ICD-10-CM | POA: Insufficient documentation

## 2019-10-31 DIAGNOSIS — D510 Vitamin B12 deficiency anemia due to intrinsic factor deficiency: Secondary | ICD-10-CM | POA: Diagnosis not present

## 2019-10-31 DIAGNOSIS — Z79899 Other long term (current) drug therapy: Secondary | ICD-10-CM | POA: Diagnosis not present

## 2019-10-31 DIAGNOSIS — D500 Iron deficiency anemia secondary to blood loss (chronic): Secondary | ICD-10-CM

## 2019-10-31 DIAGNOSIS — M2550 Pain in unspecified joint: Secondary | ICD-10-CM | POA: Insufficient documentation

## 2019-10-31 DIAGNOSIS — R0602 Shortness of breath: Secondary | ICD-10-CM | POA: Diagnosis not present

## 2019-10-31 DIAGNOSIS — M791 Myalgia, unspecified site: Secondary | ICD-10-CM | POA: Diagnosis not present

## 2019-10-31 DIAGNOSIS — R61 Generalized hyperhidrosis: Secondary | ICD-10-CM | POA: Diagnosis not present

## 2019-10-31 DIAGNOSIS — D7581 Myelofibrosis: Secondary | ICD-10-CM

## 2019-10-31 DIAGNOSIS — R5383 Other fatigue: Secondary | ICD-10-CM | POA: Insufficient documentation

## 2019-10-31 DIAGNOSIS — R110 Nausea: Secondary | ICD-10-CM | POA: Insufficient documentation

## 2019-10-31 DIAGNOSIS — D539 Nutritional anemia, unspecified: Secondary | ICD-10-CM

## 2019-10-31 DIAGNOSIS — R12 Heartburn: Secondary | ICD-10-CM | POA: Insufficient documentation

## 2019-10-31 DIAGNOSIS — R42 Dizziness and giddiness: Secondary | ICD-10-CM | POA: Diagnosis not present

## 2019-10-31 DIAGNOSIS — R109 Unspecified abdominal pain: Secondary | ICD-10-CM | POA: Diagnosis not present

## 2019-10-31 DIAGNOSIS — Z7982 Long term (current) use of aspirin: Secondary | ICD-10-CM | POA: Diagnosis not present

## 2019-10-31 DIAGNOSIS — D75839 Thrombocytosis, unspecified: Secondary | ICD-10-CM

## 2019-10-31 LAB — FERRITIN: Ferritin: 2970 ng/mL — ABNORMAL HIGH (ref 11–307)

## 2019-10-31 LAB — CBC WITH DIFFERENTIAL (CANCER CENTER ONLY)
Abs Immature Granulocytes: 0.01 10*3/uL (ref 0.00–0.07)
Basophils Absolute: 0 10*3/uL (ref 0.0–0.1)
Basophils Relative: 1 %
Eosinophils Absolute: 0.1 10*3/uL (ref 0.0–0.5)
Eosinophils Relative: 3 %
HCT: 36.4 % (ref 36.0–46.0)
Hemoglobin: 11.7 g/dL — ABNORMAL LOW (ref 12.0–15.0)
Immature Granulocytes: 0 %
Lymphocytes Relative: 24 %
Lymphs Abs: 1.1 10*3/uL (ref 0.7–4.0)
MCH: 33.6 pg (ref 26.0–34.0)
MCHC: 32.1 g/dL (ref 30.0–36.0)
MCV: 104.6 fL — ABNORMAL HIGH (ref 80.0–100.0)
Monocytes Absolute: 0.4 10*3/uL (ref 0.1–1.0)
Monocytes Relative: 9 %
Neutro Abs: 2.8 10*3/uL (ref 1.7–7.7)
Neutrophils Relative %: 63 %
Platelet Count: 503 10*3/uL — ABNORMAL HIGH (ref 150–400)
RBC: 3.48 MIL/uL — ABNORMAL LOW (ref 3.87–5.11)
RDW: 15.3 % (ref 11.5–15.5)
WBC Count: 4.4 10*3/uL (ref 4.0–10.5)
nRBC: 0 % (ref 0.0–0.2)

## 2019-10-31 LAB — SAVE SMEAR(SSMR), FOR PROVIDER SLIDE REVIEW

## 2019-10-31 LAB — INTRINSIC FACTOR ANTIBODIES: Intrinsic Factor: NEGATIVE

## 2019-10-31 LAB — IRON AND TIBC
Iron: 93 ug/dL (ref 41–142)
Saturation Ratios: 46 % (ref 21–57)
TIBC: 204 ug/dL — ABNORMAL LOW (ref 236–444)
UIBC: 110 ug/dL — ABNORMAL LOW (ref 120–384)

## 2019-10-31 LAB — CMP (CANCER CENTER ONLY)
ALT: 21 U/L (ref 0–44)
AST: 26 U/L (ref 15–41)
Albumin: 4 g/dL (ref 3.5–5.0)
Alkaline Phosphatase: 85 U/L (ref 38–126)
Anion gap: 7 (ref 5–15)
BUN: 15 mg/dL (ref 8–23)
CO2: 30 mmol/L (ref 22–32)
Calcium: 8.8 mg/dL — ABNORMAL LOW (ref 8.9–10.3)
Chloride: 103 mmol/L (ref 98–111)
Creatinine: 0.84 mg/dL (ref 0.44–1.00)
GFR, Est AFR Am: >60 mL/min (ref >60)
GFR, Estimated: >60 mL/min (ref >60)
Glucose, Bld: 98 mg/dL (ref 70–99)
Potassium: 3.3 mmol/L — ABNORMAL LOW (ref 3.5–5.1)
Sodium: 140 mmol/L (ref 135–145)
Total Bilirubin: 0.5 mg/dL (ref 0.3–1.2)
Total Protein: 7.4 g/dL (ref 6.5–8.1)

## 2019-10-31 LAB — LACTATE DEHYDROGENASE: LDH: 213 U/L — ABNORMAL HIGH (ref 98–192)

## 2019-10-31 MED ORDER — CYANOCOBALAMIN 1000 MCG/ML IJ SOLN
INTRAMUSCULAR | Status: AC
  Filled 2019-10-31: qty 1

## 2019-10-31 MED ORDER — CYANOCOBALAMIN 1000 MCG/ML IJ SOLN
1000.0000 ug | Freq: Once | INTRAMUSCULAR | Status: AC
  Administered 2019-10-31: 1000 ug via INTRAMUSCULAR

## 2019-10-31 MED ORDER — CYANOCOBALAMIN 1000 MCG/ML IJ SOLN: INTRAMUSCULAR | 2 refills | Status: AC

## 2019-10-31 NOTE — Progress Notes (Signed)
Hematology and Oncology Follow Up Visit  Rachael Johnson [name] [birth year] 69 y.o. 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  I must say that I have to give Dr. Randel Pigg a ton of credit for finding that she discovered that Rachael Johnson has low vitamin B12 level.  A vitamin B12 level was done a couple days ago.  This showed a level 120.  I am surprised that it is this low.  When we last checked the vitamin B12 a couple years ago, the level was 230.  Rachael Johnson wants to do vitamin B12 at home.  I do not see a problem with her doing this.  Her son gives her the interferon at home.  Doing the vitamin B-12 at home would be a lot easier for her so she does not have to drive down here all the time.  The increase in interferon has helped the platelets.  The platelet count is now 500,000.  She has had no problems with bleeding.  She has had no change in bowel or bladder habits.  She has had no rashes.  There has been no problems with fever.  She has had no headache.  Overall, her performance status is ECOG 1.     Medications:  Allergies as of 10/31/2019   No Known Allergies     Medication List       Accurate as of October 31, 2019  9:40 AM. If you have any questions, ask your nurse or doctor.        albuterol 108 (90 Base) MCG/ACT inhaler Commonly known as: VENTOLIN HFA INHALE 2 PUFFS BY MOUTH EVERY 6 HOURS AS NEEDED FOR WHEEZING OR SHORTNESS OF BREATH   Aller-Chlor 4 MG  tablet Generic drug: chlorpheniramine TAKE 2 TABLETS BY MOUTH EVERY NIGHT AT BEDTIME AS NEEDED FOR ALLERGIES   ALPRAZolam 1 MG tablet Commonly known as: XANAX TAKE 1 TABLET BY MOUTH THREE TIMES DAILY AS NEEDED FOR ANXIETY   amoxicillin 500 MG capsule Commonly known as: AMOXIL Take 1 capsule (500 mg total) by mouth 3 (three) times daily.   atorvastatin 10 MG tablet Commonly known as: LIPITOR Take 1 tablet (10 mg total) by mouth daily.   budesonide-formoterol 80-4.5 MCG/ACT inhaler Commonly known as: Symbicort Inhale 2 puffs into the lungs 2 (two) times daily.   busPIRone 15 MG tablet Commonly known as: BUSPAR TAKE 1 TABLET BY MOUTH FOUR TIMES DAILY AS NEEDED   Cranberry 300 MG tablet Take 300 mg by mouth 2 (two) times daily.   cyclobenzaprine 10 MG tablet Commonly known as: FLEXERIL TAKE 1 TABLET BY MOUTH TWICE DAILY AS NEEDED FOR MUSCLE SPASMS   diphenhydrAMINE 12.5 MG/5ML liquid Commonly known as: EQ Allergy Relief Childrens TAKE 5 ML BY MOUTH THREE TIMES DAILY AS NEEDED FOR MOUTH  PAIN   estradiol 0.5 MG tablet Commonly known as: ESTRACE Take 1 tablet (0.5 mg total) by mouth 2 (two) times daily.   famotidine 40 MG tablet  Commonly known as: PEPCID Take 1 tablet (40 mg total) by mouth daily.   fluconazole 150 MG tablet Commonly known as: DIFLUCAN TAKE 1 TABLET BY MOUTH ONCE WEEKLY   fluticasone 50 MCG/ACT nasal spray Commonly known as: FLONASE INSTILL TWO (2) SPRAYS IN EACH NOSTRIL ONCE DAILY   furosemide 20 MG tablet Commonly known as: LASIX TAKE 1 TABLET BY MOUTH 3 TIMES DAILY AS NEEDED FOR EDEMA   gabapentin 400 MG capsule Commonly known as: NEURONTIN Take 1 capsule (400 mg total) by mouth daily.   gabapentin 100 MG capsule Commonly known as: NEURONTIN TAKE FOUR (4) CAPSULES BY MOUTH EVERY NIGHT AT BEDTIME   levothyroxine 100 MCG tablet Commonly known as: Euthyrox TAKE 1 TABLET BY MOUTH ONCE DAILY BEFORE BREAKFAST   magic mouthwash Soln Take 5  mLs by mouth 3 (three) times daily as needed for mouth pain. Components benadryl  525 mg, hydrocortisone 60 mg and nystatin 0.6 mg. 240 ml - Oral   meclizine 12.5 MG tablet Commonly known as: ANTIVERT TAKE 1 TABLET BY MOUTH THREE TIMES DAILY AS NEEDED FOR DIZZINESS   montelukast 10 MG tablet Commonly known as: SINGULAIR Take 1 tablet (10 mg total) by mouth at bedtime.   MULTIVITAMIN PO Take 1 tablet by mouth every morning.   nystatin 100000 UNIT/ML suspension Commonly known as: MYCOSTATIN Take 5 mLs (500,000 Units total) by mouth 4 (four) times daily.   ondansetron 8 MG tablet Commonly known as: ZOFRAN TAKE 1/2-1 TABLET BY MOUTH EVERY 8 HOURS AS NEEDED FOR NAUSEA AND VOMITING   optichamber diamond Misc USE as directed. Use with inhaler   oxyCODONE-acetaminophen 10-325 MG tablet Commonly known as: PERCOCET Take 1 tablet by mouth every 6 (six) hours as needed for pain.   Pegasys 180 MCG/ML injection Generic drug: peginterferon alfa-2a Inject 0.5 mLs (90 mcg total) into the skin every 7 (seven) days.   Restasis 0.05 % ophthalmic emulsion Generic drug: cycloSPORINE PLACE ONE DROP INTO EACH EYE EVERY 12 HOURS   Sancuso 3.1 MG/24HR Generic drug: granisetron APPLY 1 PATCH TOPICALLY AS NEEDED FOR NAUSEA, REMOVE AFTER 7 DAYS   SSD 1 % cream Generic drug: silver sulfADIAZINE APPLY TOPICALLY DAILY   traZODone 100 MG tablet Commonly known as: DESYREL TAKE 1 TABLET BY MOUTH AT BEDTIME   TUBERCULIN SYR 1CC/27GX1/2" 27G X 1/2" 1 ML Misc Commonly known as: B-D TB SYRINGE 1CC/27GX1/2" USE AS DIRECTED ONCE WEEKLY FOR PEG-INTERFERON INJECTIONS   venlafaxine XR 75 MG 24 hr capsule Commonly known as: EFFEXOR-XR TAKE 1 CAPSULE BY MOUTH THREE TIMES DAILY       Allergies: No Known Allergies  Past Medical History, Surgical history, Social history, and Family History were reviewed and updated.  Review of Systems: Review of Systems  Constitutional: Positive for diaphoresis and  malaise/fatigue.  HENT: Negative.   Eyes: Negative.   Respiratory: Positive for shortness of breath.   Cardiovascular: Negative.   Gastrointestinal: Positive for abdominal pain, heartburn and nausea.  Genitourinary: Negative.   Musculoskeletal: Positive for joint pain and myalgias.  Skin: Negative.   Neurological: Positive for dizziness.  Endo/Heme/Allergies: Negative.   Psychiatric/Behavioral: Negative.      Physical Exam:  weight is 147 lb (66.7 kg). Her temporal temperature is 97.3 F (36.3 C) (abnormal). Her blood pressure is 155/99 (abnormal) and her pulse is 97. Her respiration is 18 and oxygen saturation is 99%.   Wt Readings from Last 3 Encounters:  10/31/19 147 lb (66.7 kg)  09/30/19 147 lb 1.9 oz (66.7 kg)  08/22/19 142 lb (64.4 kg)    Physical Exam Vitals reviewed.  HENT:     Head: Normocephalic and atraumatic.  Eyes:     Pupils: Pupils are equal, round, and reactive to light.  Cardiovascular:     Rate and Rhythm: Normal rate and regular rhythm.     Heart sounds: Normal heart sounds.  Pulmonary:     Effort: Pulmonary effort is normal.     Breath sounds: Normal breath sounds.  Abdominal:     General: Bowel sounds are normal.     Palpations: Abdomen is soft.     Comments: Her spleen tip is palpable a couple centimeters below the left costal margin.  Musculoskeletal:        General: No tenderness or deformity. Normal range of motion.     Cervical back: Normal range of motion.  Lymphadenopathy:     Cervical: No cervical adenopathy.  Skin:    General: Skin is warm and dry.     Findings: No erythema or rash.  Neurological:     Mental Status: She is alert and oriented to person, place, and time.  Psychiatric:        Behavior: Behavior normal.        Thought Content: Thought content normal.        Judgment: Judgment normal.      Lab Results  Component Value Date   WBC 4.4 10/31/2019   HGB 11.7 (L) 10/31/2019   HCT 36.4 10/31/2019   MCV 104.6 (H)  10/31/2019   PLT 503 (H) 10/31/2019   Lab Results  Component Value Date   FERRITIN 2,810 (H) 09/30/2019   IRON 161 (H) 09/30/2019   TIBC 181 (L) 09/30/2019   UIBC 20 (L) 09/30/2019   IRONPCTSAT 89 (H) 09/30/2019   Lab Results  Component Value Date   RETICCTPCT 1.4 08/19/2019   RBC 3.48 (L) 10/31/2019   RETICCTABS 45.8 06/02/2015   No results found for: KPAFRELGTCHN, LAMBDASER, KAPLAMBRATIO No results found for: IGGSERUM, IGA, IGMSERUM No results found for: Odetta Pink, SPEI   Chemistry      Component Value Date/Time   NA 140 10/31/2019 0843   NA 143 07/16/2017 1057   NA 140 11/29/2016 0936   K 3.3 (L) 10/31/2019 0843   K 3.2 (L) 07/16/2017 1057   K 4.2 11/29/2016 0936   CL 103 10/31/2019 0843   CL 104 07/16/2017 1057   CO2 30 10/31/2019 0843   CO2 25 07/16/2017 1057   CO2 27 11/29/2016 0936   BUN 15 10/31/2019 0843   BUN 10 07/16/2017 1057   BUN 18.3 11/29/2016 0936   CREATININE 0.84 10/31/2019 0843   CREATININE 0.8 07/16/2017 1057   CREATININE 0.8 11/29/2016 0936      Component Value Date/Time   CALCIUM 8.8 (L) 10/31/2019 0843   CALCIUM 8.9 07/16/2017 1057   CALCIUM 8.9 11/29/2016 0936   ALKPHOS 85 10/31/2019 0843   ALKPHOS 71 07/16/2017 1057   ALKPHOS 77 11/29/2016 0936   AST 26 10/31/2019 0843   AST 19 11/29/2016 0936   ALT 21 10/31/2019 0843   ALT 27 07/16/2017 1057   ALT 10 11/29/2016 0936   BILITOT 0.5 10/31/2019 0843   BILITOT <0.22 11/29/2016 0936      Impression and Plan: Rachael Johnson is a very pleasant 69 yo caucasian female with essential thrombocythemia.   For right now, we will see how the vitamin B12 does for her.  We will give  her a dose in the office today.  I went ahead and gave her a prescription for the vitamin B12 at home.  Hopefully, the increase in interferon dose is going to keep the platelet count at a reasonable level.  We will go ahead and plan to get her back in 6 weeks now.   Hopefully she will feel a bit better if her vitamin B12 level gets higher.   [name], MD 4/2/20219:40 AM

## 2019-10-31 NOTE — Patient Instructions (Signed)
Cyanocobalamin, Pyridoxine, and Folate What is this medicine? A multivitamin containing folic acid, vitamin B6, and vitamin B12. This medicine may be used for other purposes; ask your health care provider or pharmacist if you have questions. COMMON BRAND NAME(S): AllanFol RX, AllanTex, Av-Vite FB, B Complex with Folic Acid, ComBgen, FaBB, Folamin, Folastin, Folbalin, Folbee, Folbic, Folcaps, Folgard, Folgard RX, Folgard RX 2.2, Folplex, Folplex 2.2, Foltabs 800, Foltx, Homocysteine Formula, Niva-Fol, NuFol, TL Gard RX, Virt-Gard, Virt-Vite, Virt-Vite Forte, Vita-Respa What should I tell my health care provider before I take this medicine? They need to know if you have any of these conditions:  bleeding or clotting disorder  history of anemia of any type  other chronic health condition  an unusual or allergic reaction to vitamins, other medicines, foods, dyes, or preservatives  pregnant or trying to get pregnant  breast-feeding How should I use this medicine? Take by mouth with a glass of water. May take with food. Follow the directions on the prescription label. It is usually given once a day. Do not take your medicine more often than directed. Contact your pediatrician regarding the use of this medicine in children. Special care may be needed. Overdosage: If you think you have taken too much of this medicine contact a poison control center or emergency room at once. NOTE: This medicine is only for you. Do not share this medicine with others. What if I miss a dose? If you miss a dose, take it as soon as you can. If it is almost time for your next dose, take only that dose. Do not take double or extra doses. What may interact with this medicine?  levodopa This list may not describe all possible interactions. Give your health care provider a list of all the medicines, herbs, non-prescription drugs, or dietary supplements you use. Also tell them if you smoke, drink alcohol, or use illegal  drugs. Some items may interact with your medicine. What should I watch for while using this medicine? See your health care professional for regular checks on your progress. Remember that vitamin supplements do not replace the need for good nutrition from a balanced diet. What side effects may I notice from receiving this medicine? Side effects that you should report to your doctor or health care professional as soon as possible:  allergic reaction such as skin rash or difficulty breathing  vomiting Side effects that usually do not require medical attention (report to your doctor or health care professional if they continue or are bothersome):  nausea  stomach upset This list may not describe all possible side effects. Call your doctor for medical advice about side effects. You may report side effects to FDA at 1-800-FDA-1088. Where should I keep my medicine? Keep out of the reach of children. Most vitamins should be stored at controlled room temperature. Check your specific product directions. Protect from heat and moisture. Throw away any unused medicine after the expiration date. NOTE: This sheet is a summary. It may not cover all possible information. If you have questions about this medicine, talk to your doctor, pharmacist, or health care provider.  2020 [employer]/Gold Standard (2007-09-07 00:59:55)  

## 2019-11-02 ENCOUNTER — Encounter: Payer: Self-pay | Admitting: Family Medicine

## 2019-11-02 DIAGNOSIS — M549 Dorsalgia, unspecified: Secondary | ICD-10-CM

## 2019-11-02 DIAGNOSIS — G8929 Other chronic pain: Secondary | ICD-10-CM

## 2019-11-03 ENCOUNTER — Other Ambulatory Visit: Payer: Self-pay | Admitting: Family Medicine

## 2019-11-03 DIAGNOSIS — G8929 Other chronic pain: Secondary | ICD-10-CM

## 2019-11-03 MED ORDER — OXYCODONE-ACETAMINOPHEN 10-325 MG PO TABS: 1.0000 | ORAL_TABLET | Freq: Four times a day (QID) | ORAL | 0 refills | Status: DC | PRN

## 2019-11-03 NOTE — Telephone Encounter (Signed)
See other mychart message.

## 2019-11-06 ENCOUNTER — Encounter: Payer: Self-pay | Admitting: Family Medicine

## 2019-11-07 ENCOUNTER — Other Ambulatory Visit: Payer: Self-pay | Admitting: Hematology & Oncology

## 2019-11-07 DIAGNOSIS — D473 Essential (hemorrhagic) thrombocythemia: Secondary | ICD-10-CM

## 2019-11-10 MED FILL — BD TB SYRINGE 27GX1/2: 27G X 1/2" | 28 days supply | Qty: 4 | Fill #2

## 2019-11-10 MED FILL — PEGASYS 180 MCG/ML VIAL: 180 | 28 days supply | Qty: 4 | Fill #0

## 2019-11-13 ENCOUNTER — Other Ambulatory Visit: Payer: Self-pay | Admitting: Family Medicine

## 2019-11-13 ENCOUNTER — Other Ambulatory Visit: Payer: Self-pay

## 2019-11-13 ENCOUNTER — Ambulatory Visit (INDEPENDENT_AMBULATORY_CARE_PROVIDER_SITE_OTHER): Payer: Medicare Other | Admitting: Family Medicine

## 2019-11-13 DIAGNOSIS — J449 Chronic obstructive pulmonary disease, unspecified: Secondary | ICD-10-CM

## 2019-11-13 DIAGNOSIS — D510 Vitamin B12 deficiency anemia due to intrinsic factor deficiency: Secondary | ICD-10-CM

## 2019-11-13 DIAGNOSIS — E032 Hypothyroidism due to medicaments and other exogenous substances: Secondary | ICD-10-CM

## 2019-11-13 DIAGNOSIS — I10 Essential (primary) hypertension: Secondary | ICD-10-CM

## 2019-11-13 DIAGNOSIS — T7840XD Allergy, unspecified, subsequent encounter: Secondary | ICD-10-CM

## 2019-11-13 MED ORDER — MONTELUKAST SODIUM 10 MG PO TABS: 10.0000 mg | ORAL_TABLET | Freq: Every day | ORAL | 2 refills | Status: DC

## 2019-11-14 NOTE — Progress Notes (Signed)
Virtual Visit via phone Note  I connected with Rachael Johnson on 11/13/19 at  4:00 PM EDT by a phone enabled telemedicine application and verified that I am speaking with the correct person using two identifiers.  Location: Patient: home Provider: home   I discussed the limitations of evaluation and management by telemedicine and the availability of in person appointments. The patient expressed understanding and agreed to proceed. Marin Roberts CMA was able to get the patient set up on a phone visit after being unable to set up a video visit   Subjective:    Patient ID: Rachael Johnson, female    DOB: 09-18-1950, 69 y.o.   MRN: [id number]  No chief complaint on file.   HPI Patient is in today for follow up on chronic medical concerns. No recent febrile illness or hospitalizations. She is noting a flare in her allergies and she needs a refill on her Singulair that is usually helpful. She is feeling slightly better since starting Vitamin B 12 shots. Denies CP/palp/SOB/HA/fevers/GI or GU c/o. Taking meds as prescribed  Past Medical History:  Diagnosis Date  . Acute renal insufficiency 04/09/2017  . Anemia, iron deficiency 07/08/2012  . Arthritis   . Cataract 12/21/2014  . COPD (chronic obstructive pulmonary disease) ([place]) 01/23/2017   01/23/2017   Walked RA  2 laps @ 185 ft each stopped due to  Cough > > sob/ no desat nl pace  . Depression with anxiety 03/24/2011  . Diverticulitis   . Esophageal reflux 08/17/2013  . Essential thrombocythemia ([place]) 01/24/2011  . Frequent episodic tension-type headache   . Gastroenteritis 12/21/2014  . 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 GERD (gastroesophageal reflux disease)   . H. pylori infection 12/19/2012  . Hyperglycemia 12/17/2015  . Hypertension   . Iron deficiency anemia due to chronic blood loss 04/03/2017  . Osteoarthritis 05/24/2017  . Other and unspecified hyperlipidemia 02/25/2013  . Restless leg syndrome 09/30/2014  . Rhabdomyolysis 02/2017  . Scabies  03/28/2015  . 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 Seizure ([place])    childhood  . Shoulder wound, right, sequela 03/14/2017  . Thyroid disease     Past Surgical History:  Procedure Laterality Date  . ABDOMINAL HYSTERECTOMY     1992  . BREAST BIOPSY    . BREAST CYST ASPIRATION    . BREAST SURGERY  1992   biopsy, benign. Fibrocystic  . UMBILICAL HERNIA REPAIR N/A 09/25/2012   Procedure: HERNIA REPAIR UMBILICAL ADULT;  Surgeon: Harl Bowie, MD;  Location: [facility];  Service: General;  Laterality: N/A;    Family History  Problem Relation Age of Onset  . Arthritis Mother   . Cancer Mother        ovarian  . Hypertension Mother   . Heart disease Mother        pacer  . Heart failure Mother   . Arthritis Father   . Cancer Father        lung  . Hypertension Father   . Cancer Sister        lung  . Cancer Brother        prostate  . Cancer Brother        lung  . Hypertension Son   . COPD Brother   . Heart disease Brother        died from CHF  . Hypertension Brother   . Cancer Brother        colon  . Alcohol abuse Brother   .  Cirrhosis Brother   . Seizures Sister   . Stroke Sister   . Arthritis Brother     Social History   Socioeconomic History  . Marital status: Widowed    Spouse name: Not on file  . Number of children: 2  . Years of education: Not on file  . Highest education level: Not on file  Occupational History  . Occupation: APL IT sales professional: [employer]    Comment: [employer]  Tobacco Use  . Smoking status: Former Smoker    Packs/day: 3.50    Years: 20.00    Pack years: 70.00    Types: Cigarettes    Start date: 11/15/1970    Quit date: 07/31/1990    Years since quitting: 29.3  . Smokeless tobacco: Never Used  . Tobacco comment: quit 23 years ago  Substance and Sexual Activity  . Alcohol use: No    Alcohol/week: 0.0 standard drinks  . Drug use: No  . Sexual activity: Never  Other Topics Concern  . Not on file  Social History  Narrative   Regular exercise: no   Caffeine Use: 2-3 weekly   Social Determinants of Health   Financial Resource Strain: Low Risk   . Difficulty of Paying Living Expenses: Not hard at all  Food Insecurity: No Food Insecurity  . Worried About Charity fundraiser in the Last Year: Never true  . Ran Out of Food in the Last Year: Never true  Transportation Needs: No Transportation Needs  . Lack of Transportation (Medical): No  . Lack of Transportation (Non-Medical): No  Physical Activity:   . Days of Exercise per Week:   . Minutes of Exercise per Session:   Stress:   . Feeling of Stress :   Social Connections:   . Frequency of Communication with Friends and Family:   . Frequency of Social Gatherings with Friends and Family:   . Attends Religious Services:   . Active Member of Clubs or Organizations:   . Attends Archivist Meetings:   Marland Kitchen Marital Status:   Intimate Partner Violence:   . Fear of Current or Ex-Partner:   . Emotionally Abused:   Marland Kitchen Physically Abused:   . Sexually Abused:     Outpatient Medications Prior to Visit  Medication Sig Dispense Refill  . albuterol (VENTOLIN HFA) 108 (90 Base) MCG/ACT inhaler INHALE 2 PUFFS BY MOUTH EVERY 6 HOURS AS NEEDED FOR WHEEZING OR SHORTNESS OF BREATH 54 g 0  . ALLER-CHLOR 4 MG tablet TAKE 2 TABLETS BY MOUTH EVERY NIGHT AT BEDTIME AS NEEDED FOR ALLERGIES 30 tablet 1  . ALPRAZolam (XANAX) 1 MG tablet TAKE 1 TABLET BY MOUTH THREE TIMES DAILY AS NEEDED FOR ANXIETY 90 tablet 4  . amoxicillin (AMOXIL) 500 MG capsule Take 1 capsule (500 mg total) by mouth 3 (three) times daily. 30 capsule 0  . atorvastatin (LIPITOR) 10 MG tablet Take 1 tablet (10 mg total) by mouth daily. 90 tablet 3  . budesonide-formoterol (SYMBICORT) 80-4.5 MCG/ACT inhaler Inhale 2 puffs into the lungs 2 (two) times daily. 30.6 g 1  . busPIRone (BUSPAR) 15 MG tablet TAKE 1 TABLET BY MOUTH FOUR TIMES DAILY AS NEEDED 120 tablet 4  . Cranberry 300 MG tablet Take 300  mg by mouth 2 (two) times daily.    . cyanocobalamin (,VITAMIN B-12,) 1000 MCG/ML injection Inject 1 ml into muscle q week x 4 then q monthly 10 mL 2  . cyclobenzaprine (FLEXERIL) 10 MG tablet TAKE  1 TABLET BY MOUTH TWICE DAILY AS NEEDED FOR MUSCLE SPASMS 60 tablet 0  . diphenhydrAMINE (EQ ALLERGY RELIEF CHILDRENS) 12.5 MG/5ML liquid TAKE 5 ML BY MOUTH THREE TIMES DAILY AS NEEDED FOR MOUTH  PAIN 240 mL 1  . estradiol (ESTRACE) 0.5 MG tablet TAKE 1 TABLET BY MOUTH TWICE DAILY 60 tablet 1  . famotidine (PEPCID) 40 MG tablet Take 1 tablet (40 mg total) by mouth daily. 90 tablet 1  . fluconazole (DIFLUCAN) 150 MG tablet TAKE 1 TABLET BY MOUTH ONCE WEEKLY 2 tablet 10  . fluticasone (FLONASE) 50 MCG/ACT nasal spray INSTILL TWO (2) SPRAYS IN EACH NOSTRIL ONCE DAILY 16 g 2  . furosemide (LASIX) 20 MG tablet TAKE 1 TABLET BY MOUTH 3 TIMES DAILY AS NEEDED FOR EDEMA 90 tablet 10  . gabapentin (NEURONTIN) 100 MG capsule TAKE FOUR (4) CAPSULES BY MOUTH EVERY NIGHT AT BEDTIME 120 capsule 10  . gabapentin (NEURONTIN) 400 MG capsule Take 1 capsule (400 mg total) by mouth daily. 90 capsule 1  . granisetron (SANCUSO) 3.1 MG/24HR APPLY 1 PATCH TOPICALLY AS NEEDED FOR NAUSEA, REMOVE AFTER 7 DAYS 4 patch 4  . levothyroxine (SYNTHROID) 100 MCG tablet TAKE 1 TABLET BY MOUTH EVERY MORNING BEFORE BREAKFAST 30 tablet 1  . magic mouthwash SOLN Take 5 mLs by mouth 3 (three) times daily as needed for mouth pain. Components benadryl  525 mg, hydrocortisone 60 mg and nystatin 0.6 mg. 240 ml - Oral 240 mL 3  . meclizine (ANTIVERT) 12.5 MG tablet TAKE 1 TABLET BY MOUTH THREE TIMES DAILY AS NEEDED FOR DIZZINESS 40 tablet 10  . Multiple Vitamin (MULTIVITAMIN PO) Take 1 tablet by mouth every morning.     . nystatin (MYCOSTATIN) 100000 UNIT/ML suspension Take 5 mLs (500,000 Units total) by mouth 4 (four) times daily. 60 mL 3  . ondansetron (ZOFRAN) 8 MG tablet TAKE 1/2-1 TABLET BY MOUTH EVERY 8 HOURS AS NEEDED FOR NAUSEA AND VOMITING  90 tablet 10  . oxyCODONE-acetaminophen (PERCOCET) 10-325 MG tablet Take 1 tablet by mouth every 6 (six) hours as needed for pain. 120 tablet 0  . PEGASYS 180 MCG/ML injection INJECT 0.25 MLS (45 MCG TOTAL) INTO THE SKIN EVERY 7 (SEVEN) DAYS. SINGLE USE VIALS, AFTER INJECTION DISPOSE OF REMAINING VIAL CONTENT. 4 mL 6  . RESTASIS 0.05 % ophthalmic emulsion PLACE ONE DROP INTO EACH EYE EVERY 12 HOURS 360 each 3  . Spacer/Aero-Holding Chambers (OPTICHAMBER DIAMOND) MISC USE as directed. Use with inhaler 1 each 0  . SSD 1 % cream APPLY TOPICALLY DAILY 50 g 0  . traZODone (DESYREL) 100 MG tablet TAKE 1 TABLET BY MOUTH AT BEDTIME 30 tablet 10  . TUBERCULIN SYR 1CC/27GX1/2" (B-D TB SYRINGE 1CC/27GX1/2") 27G X 1/2" 1 ML MISC USE AS DIRECTED ONCE WEEKLY FOR PEG-INTERFERON INJECTIONS 4 each 11  . venlafaxine XR (EFFEXOR-XR) 75 MG 24 hr capsule TAKE 1 CAPSULE BY MOUTH THREE TIMES DAILY 90 capsule 10  . montelukast (SINGULAIR) 10 MG tablet Take 1 tablet (10 mg total) by mouth at bedtime. 90 tablet 2   No facility-administered medications prior to visit.    No Known Allergies  Review of Systems  Constitutional: Positive for malaise/fatigue. Negative for fever.  HENT: Positive for congestion. Negative for sinus pain.   Eyes: Negative for blurred vision.  Respiratory: Positive for shortness of breath.   Cardiovascular: Negative for chest pain, palpitations and leg swelling.  Gastrointestinal: Negative for abdominal pain, blood in stool and nausea.  Genitourinary: Negative for dysuria  and frequency.  Musculoskeletal: Positive for back pain and joint pain. Negative for falls.  Skin: Negative for rash.  Neurological: Negative for dizziness, loss of consciousness and headaches.  Endo/Heme/Allergies: Negative for environmental allergies.  Psychiatric/Behavioral: Positive for depression. The patient is nervous/anxious.        Objective:    Physical Exam unable to obtain via phone visit  LMP 07/31/1990   Wt Readings from Last 3 Encounters:  10/31/19 147 lb (66.7 kg)  09/30/19 147 lb 1.9 oz (66.7 kg)  08/22/19 142 lb (64.4 kg)    Diabetic Foot Exam - Simple   No data filed     Lab Results  Component Value Date   WBC 4.4 10/31/2019   HGB 11.7 (L) 10/31/2019   HCT 36.4 10/31/2019   PLT 503 (H) 10/31/2019   GLUCOSE 98 10/31/2019   CHOL 236 (H) 05/06/2018   TRIG 322.0 (H) 05/06/2018   HDL 59.00 05/06/2018   LDLDIRECT 95.0 05/06/2018   LDLCALC 82 02/12/2017   ALT 21 10/31/2019   AST 26 10/31/2019   NA 140 10/31/2019   K 3.3 (L) 10/31/2019   CL 103 10/31/2019   CREATININE 0.84 10/31/2019   BUN 15 10/31/2019   CO2 30 10/31/2019   TSH 0.12 (L) 10/09/2019   INR 0.94 09/16/2018   HGBA1C 5.0 08/10/2017    Lab Results  Component Value Date   TSH 0.12 (L) 10/09/2019   Lab Results  Component Value Date   WBC 4.4 10/31/2019   HGB 11.7 (L) 10/31/2019   HCT 36.4 10/31/2019   MCV 104.6 (H) 10/31/2019   PLT 503 (H) 10/31/2019   Lab Results  Component Value Date   NA 140 10/31/2019   K 3.3 (L) 10/31/2019   CHLORIDE 105 11/29/2016   CO2 30 10/31/2019   GLUCOSE 98 10/31/2019   BUN 15 10/31/2019   CREATININE 0.84 10/31/2019   BILITOT 0.5 10/31/2019   ALKPHOS 85 10/31/2019   AST 26 10/31/2019   ALT 21 10/31/2019   PROT 7.4 10/31/2019   ALBUMIN 4.0 10/31/2019   CALCIUM 8.8 (L) 10/31/2019   ANIONGAP 7 10/31/2019   EGFR 80 (L) 11/29/2016   GFR 62.31 10/29/2019   Lab Results  Component Value Date   CHOL 236 (H) 05/06/2018   Lab Results  Component Value Date   HDL 59.00 05/06/2018   Lab Results  Component Value Date   LDLCALC 82 02/12/2017   Lab Results  Component Value Date   TRIG 322.0 (H) 05/06/2018   Lab Results  Component Value Date   CHOLHDL 4 05/06/2018   Lab Results  Component Value Date   HGBA1C 5.0 08/10/2017       Assessment & Plan:   Problem List Items Addressed This Visit    Hypothyroid    On Levothyroxine, continue to monitor       Pernicious anemia (Chronic)    She thinks she is feeling a bit better on the vitamin B 12 shots but her Intrinsic Factor was negative. She would like to stay on the shotsto keep her numbers up      Allergies    Flared recently refill given on Singulair which hs been helpful previously      Essential hypertension    Monitor and report any concerning numbers.no changes to meds. Encouraged heart healthy diet such as the DASH diet and exercise as tolerated.       COPD (chronic obstructive pulmonary disease) ([facility])    Has an appt with new  pulmonologist later this month.      Relevant Medications   montelukast (SINGULAIR) 10 MG tablet      I am having Sumer D. Kushner maintain her Multiple Vitamin (MULTIVITAMIN PO), Cranberry, SSD, magic mouthwash, optichamber diamond, atorvastatin, famotidine, gabapentin, albuterol, Restasis, budesonide-formoterol, nystatin, Aller-Chlor, fluconazole, busPIRone, ondansetron, fluticasone, TUBERCULIN SYR 1CC/27GX1/2", furosemide, venlafaxine XR, ALPRAZolam, gabapentin, meclizine, traZODone, Sancuso, diphenhydrAMINE, amoxicillin, cyclobenzaprine, cyanocobalamin, oxyCODONE-acetaminophen, Pegasys, levothyroxine, estradiol, and montelukast.  Meds ordered this encounter  Medications  . montelukast (SINGULAIR) 10 MG tablet    Sig: Take 1 tablet (10 mg total) by mouth at bedtime.    Dispense:  90 tablet    Refill:  2     I discussed the assessment and treatment plan with the patient. The patient was provided an opportunity to ask questions and all were answered. The patient agreed with the plan and demonstrated an understanding of the instructions.   The patient was advised to call back or seek an in-person evaluation if the symptoms worsen or if the condition fails to improve as anticipated.  I provided 25 minutes of non-face-to-face time during this encounter.   Penni Homans, MD

## 2019-11-14 NOTE — Assessment & Plan Note (Signed)
Has an appt with new pulmonologist later this month.

## 2019-11-14 NOTE — Assessment & Plan Note (Signed)
She thinks she is feeling a bit better on the vitamin B 12 shots but her Intrinsic Factor was negative. She would like to stay on the shotsto keep her numbers up

## 2019-11-14 NOTE — Assessment & Plan Note (Signed)
On Levothyroxine, continue to monitor 

## 2019-11-14 NOTE — Assessment & Plan Note (Signed)
Monitor and report any concerning numbers.no changes to meds. Encouraged heart healthy diet such as the DASH diet and exercise as tolerated.

## 2019-11-14 NOTE — Assessment & Plan Note (Signed)
Flared recently refill given on Singulair which hs been helpful previously

## 2019-11-21 ENCOUNTER — Encounter: Payer: Self-pay | Admitting: Hematology & Oncology

## 2019-11-24 ENCOUNTER — Telehealth: Payer: Self-pay | Admitting: Family Medicine

## 2019-11-24 DIAGNOSIS — M549 Dorsalgia, unspecified: Secondary | ICD-10-CM

## 2019-11-24 DIAGNOSIS — G8929 Other chronic pain: Secondary | ICD-10-CM

## 2019-11-24 MED ORDER — CYCLOBENZAPRINE HCL 10 MG PO TABS: ORAL_TABLET | ORAL | 0 refills | Status: DC

## 2019-11-24 NOTE — Telephone Encounter (Signed)
Medication: cyclobenzaprine (FLEXERIL) 10 MG tablet  ALLER-CHLOR 4 MG tablet [  Has the patient contacted their pharmacy? No. (If no, request that the patient contact the pharmacy for the refill.) (If yes, when and what did the pharmacy advise?)  Preferred Pharmacy (with phone number or street name): [facility], [place]  [id number], [place] [address]  Phone:  [phone] Fax:  [phone]   Agent: Please be advised that RX refills may take up to 3 business days. We ask that you follow-up with your pharmacy.

## 2019-11-24 NOTE — Telephone Encounter (Signed)
Left message on machine that rx has been sent in. 

## 2019-11-28 ENCOUNTER — Institutional Professional Consult (permissible substitution): Payer: Medicare Other | Admitting: Pulmonary Disease

## 2019-12-02 ENCOUNTER — Encounter: Payer: Self-pay | Admitting: Family Medicine

## 2019-12-02 DIAGNOSIS — G8929 Other chronic pain: Secondary | ICD-10-CM

## 2019-12-02 MED ORDER — OXYCODONE-ACETAMINOPHEN 10-325 MG PO TABS: 1.0000 | ORAL_TABLET | Freq: Four times a day (QID) | ORAL | 0 refills | Status: DC | PRN

## 2019-12-02 NOTE — Telephone Encounter (Signed)
Refill request: Oxycodone 10-325mg .  Last refill: 11/03/19, #120, 0 refills UDS: 10/09/19 Contract: 04/29/2019 Last OV: 11/13/2019 Next OV: 02/05/2020

## 2019-12-04 ENCOUNTER — Other Ambulatory Visit: Payer: Self-pay | Admitting: Family Medicine

## 2019-12-04 MED FILL — BD TB SYRINGE 27GX1/2: 27G X 1/2" | 28 days supply | Qty: 4 | Fill #3

## 2019-12-04 MED FILL — PEGASYS 180 MCG/ML VIAL: 180 | 28 days supply | Qty: 4 | Fill #1

## 2019-12-11 ENCOUNTER — Telehealth: Payer: Self-pay | Admitting: *Deleted

## 2019-12-11 NOTE — Telephone Encounter (Signed)
Call received from patient stating that she had diarrhea on Saturday and Sunday and that the diarrhea started back again yesterday evening.  Since then, she has had approx ten loose stools and has taken four Imodium with no relief of diarrhea.  Dr. Marin Olp notified.  Call placed back to patient and patient notified per order of Dr. Marin Olp to take [facility] as directed on the bottle for diarrhea.  Pt appreciative of call back and has no further questions at this time.

## 2019-12-12 ENCOUNTER — Telehealth: Payer: Self-pay | Admitting: Hematology & Oncology

## 2019-12-12 ENCOUNTER — Telehealth: Payer: Self-pay | Admitting: *Deleted

## 2019-12-12 ENCOUNTER — Inpatient Hospital Stay: Payer: Medicare Other

## 2019-12-12 ENCOUNTER — Other Ambulatory Visit: Payer: Self-pay | Admitting: Family Medicine

## 2019-12-12 ENCOUNTER — Inpatient Hospital Stay: Payer: Medicare Other | Admitting: Hematology & Oncology

## 2019-12-12 NOTE — Telephone Encounter (Signed)
Sent MyChart message to inform patient of rescheduled appointments.

## 2019-12-12 NOTE — Telephone Encounter (Signed)
Call received from patient stating that her diarrhea is better, but that she would like to reschedule her appt for today d/t she is still feeling weak.  Dr. Marin Olp notified and message sent to scheduling.

## 2019-12-19 ENCOUNTER — Inpatient Hospital Stay: Payer: Medicare Other | Attending: Hematology & Oncology

## 2019-12-19 ENCOUNTER — Other Ambulatory Visit: Payer: Self-pay

## 2019-12-19 ENCOUNTER — Encounter: Payer: Self-pay | Admitting: Hematology & Oncology

## 2019-12-19 ENCOUNTER — Inpatient Hospital Stay (HOSPITAL_BASED_OUTPATIENT_CLINIC_OR_DEPARTMENT_OTHER): Payer: Medicare Other | Admitting: Hematology & Oncology

## 2019-12-19 VITALS — BP 115/82 | HR 110 | Temp 97.1°F | Resp 19 | Wt 145.0 lb

## 2019-12-19 DIAGNOSIS — R0602 Shortness of breath: Secondary | ICD-10-CM | POA: Diagnosis not present

## 2019-12-19 DIAGNOSIS — M2550 Pain in unspecified joint: Secondary | ICD-10-CM | POA: Diagnosis not present

## 2019-12-19 DIAGNOSIS — D473 Essential (hemorrhagic) thrombocythemia: Secondary | ICD-10-CM

## 2019-12-19 DIAGNOSIS — D75839 Thrombocytosis, unspecified: Secondary | ICD-10-CM

## 2019-12-19 DIAGNOSIS — D509 Iron deficiency anemia, unspecified: Secondary | ICD-10-CM | POA: Insufficient documentation

## 2019-12-19 DIAGNOSIS — D500 Iron deficiency anemia secondary to blood loss (chronic): Secondary | ICD-10-CM

## 2019-12-19 DIAGNOSIS — M791 Myalgia, unspecified site: Secondary | ICD-10-CM | POA: Insufficient documentation

## 2019-12-19 DIAGNOSIS — Z79899 Other long term (current) drug therapy: Secondary | ICD-10-CM | POA: Diagnosis not present

## 2019-12-19 DIAGNOSIS — Z7982 Long term (current) use of aspirin: Secondary | ICD-10-CM | POA: Insufficient documentation

## 2019-12-19 DIAGNOSIS — D510 Vitamin B12 deficiency anemia due to intrinsic factor deficiency: Secondary | ICD-10-CM | POA: Diagnosis not present

## 2019-12-19 DIAGNOSIS — A084 Viral intestinal infection, unspecified: Secondary | ICD-10-CM | POA: Insufficient documentation

## 2019-12-19 DIAGNOSIS — R12 Heartburn: Secondary | ICD-10-CM | POA: Diagnosis not present

## 2019-12-19 DIAGNOSIS — R5383 Other fatigue: Secondary | ICD-10-CM | POA: Insufficient documentation

## 2019-12-19 DIAGNOSIS — R110 Nausea: Secondary | ICD-10-CM | POA: Insufficient documentation

## 2019-12-19 DIAGNOSIS — R197 Diarrhea, unspecified: Secondary | ICD-10-CM | POA: Diagnosis not present

## 2019-12-19 DIAGNOSIS — D7581 Myelofibrosis: Secondary | ICD-10-CM

## 2019-12-19 DIAGNOSIS — R61 Generalized hyperhidrosis: Secondary | ICD-10-CM | POA: Insufficient documentation

## 2019-12-19 DIAGNOSIS — R109 Unspecified abdominal pain: Secondary | ICD-10-CM | POA: Diagnosis not present

## 2019-12-19 DIAGNOSIS — R42 Dizziness and giddiness: Secondary | ICD-10-CM | POA: Insufficient documentation

## 2019-12-19 LAB — CMP (CANCER CENTER ONLY)
ALT: 31 U/L (ref 0–44)
AST: 35 U/L (ref 15–41)
Albumin: 4.1 g/dL (ref 3.5–5.0)
Alkaline Phosphatase: 112 U/L (ref 38–126)
Anion gap: 8 (ref 5–15)
BUN: 19 mg/dL (ref 8–23)
CO2: 32 mmol/L (ref 22–32)
Calcium: 9.6 mg/dL (ref 8.9–10.3)
Chloride: 98 mmol/L (ref 98–111)
Creatinine: 1.09 mg/dL — ABNORMAL HIGH (ref 0.44–1.00)
GFR, Est AFR Am: >60 mL/min (ref >60)
GFR, Estimated: 52 mL/min — ABNORMAL LOW (ref >60)
Glucose, Bld: 97 mg/dL (ref 70–99)
Potassium: 3.7 mmol/L (ref 3.5–5.1)
Sodium: 138 mmol/L (ref 135–145)
Total Bilirubin: 0.4 mg/dL (ref 0.3–1.2)
Total Protein: 7.9 g/dL (ref 6.5–8.1)

## 2019-12-19 LAB — CBC WITH DIFFERENTIAL (CANCER CENTER ONLY)
Abs Immature Granulocytes: 0.12 10*3/uL — ABNORMAL HIGH (ref 0.00–0.07)
Basophils Absolute: 0 10*3/uL (ref 0.0–0.1)
Basophils Relative: 1 %
Eosinophils Absolute: 0.2 10*3/uL (ref 0.0–0.5)
Eosinophils Relative: 4 %
HCT: 35.7 % — ABNORMAL LOW (ref 36.0–46.0)
Hemoglobin: 11.4 g/dL — ABNORMAL LOW (ref 12.0–15.0)
Immature Granulocytes: 2 %
Lymphocytes Relative: 19 %
Lymphs Abs: 1.2 10*3/uL (ref 0.7–4.0)
MCH: 33.6 pg (ref 26.0–34.0)
MCHC: 31.9 g/dL (ref 30.0–36.0)
MCV: 105.3 fL — ABNORMAL HIGH (ref 80.0–100.0)
Monocytes Absolute: 0.5 10*3/uL (ref 0.1–1.0)
Monocytes Relative: 8 %
Neutro Abs: 4.2 10*3/uL (ref 1.7–7.7)
Neutrophils Relative %: 66 %
Platelet Count: 462 10*3/uL — ABNORMAL HIGH (ref 150–400)
RBC: 3.39 MIL/uL — ABNORMAL LOW (ref 3.87–5.11)
RDW: 15.2 % (ref 11.5–15.5)
WBC Count: 6.3 10*3/uL (ref 4.0–10.5)
nRBC: 0 % (ref 0.0–0.2)

## 2019-12-19 LAB — RETICULOCYTES
Immature Retic Fract: 22.9 % — ABNORMAL HIGH (ref 2.3–15.9)
RBC.: 3.37 MIL/uL — ABNORMAL LOW (ref 3.87–5.11)
Retic Count, Absolute: 69.8 10*3/uL (ref 19.0–186.0)
Retic Ct Pct: 2.1 % (ref 0.4–3.1)

## 2019-12-19 LAB — VITAMIN B12: Vitamin B-12: 718 pg/mL (ref 180–914)

## 2019-12-19 NOTE — Progress Notes (Signed)
Hematology and Oncology Follow Up Visit  LEANDER FAVRET [name] [birth year] 69 y.o. 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 Passarella is back for follow-up.  For some reason, she has had horrible diarrhea.  I am not sure as to why she developed this.  This came on all of a sudden.  It finally took Imodium and Pepto-Bismol to finally stop.  No one else in the family had diarrhea.  She had no vomiting.  It only sounds like she had a viral enteritis.  I will know if this was some kind of colitis.  Again there is no bleeding.  She is feeling better.  She does not feel as tired.  She is doing the vitamin B12 at home.  I would like to believe this is helping her.  She is responding nicely to the interferon.  Her platelet count continues to trend downward.  Unfortunately, typically does do this and then goes right back up.  She has had no fever.  She has had no cough.  She is seeing a pulmonologist.  Overall, her performance status is ECOG 1.     Medications:  Allergies as of 12/19/2019   No Known Allergies     Medication List       Accurate as of Dec 19, 2019 12:08 PM. If you have any questions, ask your nurse or doctor.        albuterol 108 (90 Base) MCG/ACT inhaler Commonly known as: VENTOLIN HFA INHALE 2 PUFFS BY MOUTH EVERY 6 HOURS AS NEEDED FOR WHEEZING OR SHORTNESS OF BREATH   Aller-Chlor 4 MG tablet Generic drug: chlorpheniramine TAKE 2 TABLETS BY MOUTH EVERY NIGHT AT BEDTIME AS  NEEDED FOR ALLERGIES   ALPRAZolam 1 MG tablet Commonly known as: XANAX TAKE 1 TABLET BY MOUTH THREE TIMES DAILY AS NEEDED FOR ANXIETY   amoxicillin 500 MG capsule Commonly known as: AMOXIL Take 1 capsule (500 mg total) by mouth 3 (three) times daily.   atorvastatin 10 MG tablet Commonly known as: LIPITOR Take 1 tablet (10 mg total) by mouth daily.   budesonide-formoterol 80-4.5 MCG/ACT inhaler Commonly known as: Symbicort Inhale 2 puffs into the lungs 2 (two) times daily.   busPIRone 15 MG tablet Commonly known as: BUSPAR TAKE (1) TABLET BY MOUTH FOUR TIMES A DAY AS NEEDED   Cranberry 300 MG tablet Take 300 mg by mouth 2 (two) times daily.   cyanocobalamin 1000 MCG/ML injection Commonly known as: (VITAMIN B-12) Inject 1 ml into muscle q week x 4 then q monthly   cyclobenzaprine 10 MG tablet Commonly known as: FLEXERIL TAKE 1 TABLET BY MOUTH TWICE DAILY AS NEEDED FOR MUSCLE SPASMS   diphenhydrAMINE 12.5 MG/5ML liquid Commonly known as: EQ Allergy Relief Childrens TAKE 5 ML BY MOUTH THREE TIMES DAILY AS NEEDED FOR MOUTH  PAIN   estradiol 0.5 MG tablet Commonly known as: ESTRACE TAKE 1 TABLET BY MOUTH TWICE DAILY   famotidine 40 MG tablet Commonly known as: PEPCID Take 1 tablet (  40 mg total) by mouth daily.   fluconazole 150 MG tablet Commonly known as: DIFLUCAN TAKE 1 TABLET BY MOUTH ONCE WEEKLY   fluticasone 50 MCG/ACT nasal spray Commonly known as: FLONASE INSTILL TWO (2) SPRAYS IN EACH NOSTRIL ONCE DAILY   furosemide 20 MG tablet Commonly known as: LASIX TAKE 1 TABLET BY MOUTH 3 TIMES DAILY AS NEEDED FOR EDEMA   gabapentin 400 MG capsule Commonly known as: NEURONTIN Take 1 capsule (400 mg total) by mouth daily.   gabapentin 100 MG capsule Commonly known as: NEURONTIN TAKE FOUR (4) CAPSULES BY MOUTH EVERY NIGHT AT BEDTIME   levothyroxine 100 MCG tablet Commonly known as: SYNTHROID TAKE 1 TABLET BY MOUTH EVERY MORNING BEFORE BREAKFAST   magic  mouthwash Soln Take 5 mLs by mouth 3 (three) times daily as needed for mouth pain. Components benadryl  525 mg, hydrocortisone 60 mg and nystatin 0.6 mg. 240 ml - Oral   meclizine 12.5 MG tablet Commonly known as: ANTIVERT TAKE 1 TABLET BY MOUTH THREE TIMES DAILY AS NEEDED FOR DIZZINESS   montelukast 10 MG tablet Commonly known as: SINGULAIR TAKE 1 TABLET BY MOUTH AT BEDTIME   MULTIVITAMIN PO Take 1 tablet by mouth every morning.   nystatin 100000 UNIT/ML suspension Commonly known as: MYCOSTATIN Take 5 mLs (500,000 Units total) by mouth 4 (four) times daily.   ondansetron 8 MG tablet Commonly known as: ZOFRAN TAKE 1/2-1 TABLET BY MOUTH EVERY 8 HOURS AS NEEDED FOR NAUSEA AND VOMITING   optichamber diamond Misc USE as directed. Use with inhaler   oxyCODONE-acetaminophen 10-325 MG tablet Commonly known as: PERCOCET Take 1 tablet by mouth every 6 (six) hours as needed for pain.   Pegasys 180 MCG/ML injection Generic drug: peginterferon alfa-2a INJECT 0.25 MLS (45 MCG TOTAL) INTO THE SKIN EVERY 7 (SEVEN) DAYS. SINGLE USE VIALS, AFTER INJECTION DISPOSE OF REMAINING VIAL CONTENT.   Restasis 0.05 % ophthalmic emulsion Generic drug: cycloSPORINE PLACE ONE DROP INTO EACH EYE EVERY 12 HOURS   Sancuso 3.1 MG/24HR Generic drug: granisetron APPLY 1 PATCH TOPICALLY AS NEEDED FOR NAUSEA, REMOVE AFTER 7 DAYS   SSD 1 % cream Generic drug: silver sulfADIAZINE APPLY TOPICALLY DAILY   traZODone 100 MG tablet Commonly known as: DESYREL TAKE 1 TABLET BY MOUTH AT BEDTIME   TUBERCULIN SYR 1CC/27GX1/2" 27G X 1/2" 1 ML Misc Commonly known as: B-D TB SYRINGE 1CC/27GX1/2" USE AS DIRECTED ONCE WEEKLY FOR PEG-INTERFERON INJECTIONS   venlafaxine XR 75 MG 24 hr capsule Commonly known as: EFFEXOR-XR TAKE 1 CAPSULE BY MOUTH THREE TIMES DAILY       Allergies: No Known Allergies  Past Medical History, Surgical history, Social history, and Family History were reviewed and updated.  Review  of Systems: Review of Systems  Constitutional: Positive for diaphoresis and malaise/fatigue.  HENT: Negative.   Eyes: Negative.   Respiratory: Positive for shortness of breath.   Cardiovascular: Negative.   Gastrointestinal: Positive for abdominal pain, heartburn and nausea.  Genitourinary: Negative.   Musculoskeletal: Positive for joint pain and myalgias.  Skin: Negative.   Neurological: Positive for dizziness.  Endo/Heme/Allergies: Negative.   Psychiatric/Behavioral: Negative.      Physical Exam:  weight is 145 lb (65.8 kg). Her temporal temperature is 97.1 F (36.2 C) (abnormal). Her blood pressure is 115/82 and her pulse is 110 (abnormal). Her respiration is 19 and oxygen saturation is 99%.   Wt Readings from Last 3 Encounters:  12/19/19 145 lb (65.8 kg)  10/31/19 147 lb (66.7 kg)  09/30/19  147 lb 1.9 oz (66.7 kg)    Physical Exam Vitals reviewed.  HENT:     Head: Normocephalic and atraumatic.  Eyes:     Pupils: Pupils are equal, round, and reactive to light.  Cardiovascular:     Rate and Rhythm: Normal rate and regular rhythm.     Heart sounds: Normal heart sounds.  Pulmonary:     Effort: Pulmonary effort is normal.     Breath sounds: Normal breath sounds.  Abdominal:     General: Bowel sounds are normal.     Palpations: Abdomen is soft.     Comments: Her spleen tip is palpable a couple centimeters below the left costal margin.  Musculoskeletal:        General: No tenderness or deformity. Normal range of motion.     Cervical back: Normal range of motion.  Lymphadenopathy:     Cervical: No cervical adenopathy.  Skin:    General: Skin is warm and dry.     Findings: No erythema or rash.  Neurological:     Mental Status: She is alert and oriented to person, place, and time.  Psychiatric:        Behavior: Behavior normal.        Thought Content: Thought content normal.        Judgment: Judgment normal.      Lab Results  Component Value Date   WBC 6.3  12/19/2019   HGB 11.4 (L) 12/19/2019   HCT 35.7 (L) 12/19/2019   MCV 105.3 (H) 12/19/2019   PLT 462 (H) 12/19/2019   Lab Results  Component Value Date   FERRITIN 2,970 (H) 10/31/2019   IRON 93 10/31/2019   TIBC 204 (L) 10/31/2019   UIBC 110 (L) 10/31/2019   IRONPCTSAT 46 10/31/2019   Lab Results  Component Value Date   RETICCTPCT 2.1 12/19/2019   RBC 3.37 (L) 12/19/2019   RETICCTABS 45.8 06/02/2015   No results found for: KPAFRELGTCHN, LAMBDASER, KAPLAMBRATIO No results found for: IGGSERUM, IGA, IGMSERUM No results found for: Odetta Pink, SPEI   Chemistry      Component Value Date/Time   NA 138 12/19/2019 1054   NA 143 07/16/2017 1057   NA 140 11/29/2016 0936   K 3.7 12/19/2019 1054   K 3.2 (L) 07/16/2017 1057   K 4.2 11/29/2016 0936   CL 98 12/19/2019 1054   CL 104 07/16/2017 1057   CO2 32 12/19/2019 1054   CO2 25 07/16/2017 1057   CO2 27 11/29/2016 0936   BUN 19 12/19/2019 1054   BUN 10 07/16/2017 1057   BUN 18.3 11/29/2016 0936   CREATININE 1.09 (H) 12/19/2019 1054   CREATININE 0.8 07/16/2017 1057   CREATININE 0.8 11/29/2016 0936      Component Value Date/Time   CALCIUM 9.6 12/19/2019 1054   CALCIUM 8.9 07/16/2017 1057   CALCIUM 8.9 11/29/2016 0936   ALKPHOS 112 12/19/2019 1054   ALKPHOS 71 07/16/2017 1057   ALKPHOS 77 11/29/2016 0936   AST 35 12/19/2019 1054   AST 19 11/29/2016 0936   ALT 31 12/19/2019 1054   ALT 27 07/16/2017 1057   ALT 10 11/29/2016 0936   BILITOT 0.4 12/19/2019 1054   BILITOT <0.22 11/29/2016 0936      Impression and Plan: Ms. Barratt is a very pleasant 69 yo caucasian female with essential thrombocythemia.   I am just happy that the diarrhea stopped.  Again I am unsure as to what the trigger  was for the diarrhea.  She says she did not eat anything unusual.  She has not traveled anywhere.  I do not think it is hyperthyroidism.  She is only lost a couple pounds with the  diarrhea.  We are not going to make any changes with the interferon.  We will plan to get her back in another 6 weeks.  This will be right around her birthday.  [name], MD 5/21/202112:08 PM

## 2019-12-22 LAB — FERRITIN: Ferritin: 2884 ng/mL — ABNORMAL HIGH (ref 11–307)

## 2019-12-22 LAB — LACTATE DEHYDROGENASE: LDH: 228 U/L — ABNORMAL HIGH (ref 98–192)

## 2019-12-22 LAB — IRON AND TIBC
Iron: 102 ug/dL (ref 41–142)
Saturation Ratios: 50 % (ref 21–57)
TIBC: 204 ug/dL — ABNORMAL LOW (ref 236–444)
UIBC: 102 ug/dL — ABNORMAL LOW (ref 120–384)

## 2019-12-23 ENCOUNTER — Other Ambulatory Visit: Payer: Self-pay | Admitting: Family Medicine

## 2019-12-26 ENCOUNTER — Institutional Professional Consult (permissible substitution): Payer: Medicare Other | Admitting: Pulmonary Disease

## 2019-12-30 ENCOUNTER — Encounter: Payer: Self-pay | Admitting: Family Medicine

## 2019-12-30 ENCOUNTER — Other Ambulatory Visit: Payer: Self-pay | Admitting: Family Medicine

## 2019-12-30 DIAGNOSIS — G8929 Other chronic pain: Secondary | ICD-10-CM

## 2019-12-30 DIAGNOSIS — M549 Dorsalgia, unspecified: Secondary | ICD-10-CM

## 2019-12-30 MED ORDER — OXYCODONE-ACETAMINOPHEN 10-325 MG PO TABS: 1.0000 | ORAL_TABLET | Freq: Four times a day (QID) | ORAL | 0 refills | Status: DC | PRN

## 2020-01-01 ENCOUNTER — Telehealth: Payer: Self-pay | Admitting: Internal Medicine

## 2020-01-01 NOTE — Telephone Encounter (Signed)
Dr Melvyn Novas please advise if you are okay with switch to Dr Elsworth Soho

## 2020-01-01 NOTE — Telephone Encounter (Signed)
Fine with me

## 2020-01-02 ENCOUNTER — Encounter: Payer: Self-pay | Admitting: Family Medicine

## 2020-01-02 NOTE — Telephone Encounter (Signed)
Dr. Elsworth Soho - please advise if you are okay with this switch.

## 2020-01-02 NOTE — Telephone Encounter (Signed)
ATC pt, no answer. Left message for pt to call back.  

## 2020-01-02 NOTE — Telephone Encounter (Signed)
Okay to switch. Next available appointment at [facility]

## 2020-01-05 NOTE — Telephone Encounter (Signed)
LMTCB x2 for pt 

## 2020-01-06 NOTE — Telephone Encounter (Signed)
Spoke with pt. She has been scheduled to see Dr. Elsworth Soho in our [place] office on 02/13/20 at 1000.

## 2020-01-12 ENCOUNTER — Other Ambulatory Visit: Payer: Self-pay | Admitting: Family Medicine

## 2020-01-12 MED FILL — PEGASYS 180 MCG/ML VIAL: 180 | 28 days supply | Qty: 4 | Fill #2

## 2020-01-12 MED FILL — BD TB SYRINGE 27GX1/2: 27G X 1/2" | 28 days supply | Qty: 4 | Fill #3

## 2020-01-17 ENCOUNTER — Other Ambulatory Visit: Payer: Self-pay | Admitting: Family Medicine

## 2020-01-17 DIAGNOSIS — G8929 Other chronic pain: Secondary | ICD-10-CM

## 2020-01-19 MED ORDER — CYCLOBENZAPRINE HCL 10 MG PO TABS: ORAL_TABLET | ORAL | 0 refills | Status: DC

## 2020-01-29 ENCOUNTER — Encounter: Payer: Self-pay | Admitting: Family Medicine

## 2020-01-30 ENCOUNTER — Other Ambulatory Visit: Payer: Self-pay | Admitting: Family Medicine

## 2020-01-30 ENCOUNTER — Inpatient Hospital Stay: Payer: Medicare Other

## 2020-01-30 ENCOUNTER — Inpatient Hospital Stay: Payer: Medicare Other | Attending: Hematology & Oncology | Admitting: Hematology & Oncology

## 2020-01-30 ENCOUNTER — Other Ambulatory Visit: Payer: Self-pay

## 2020-01-30 ENCOUNTER — Encounter: Payer: Self-pay | Admitting: Hematology & Oncology

## 2020-01-30 VITALS — BP 131/90 | HR 94 | Temp 98.9°F | Resp 18 | Ht 63.0 in | Wt 144.2 lb

## 2020-01-30 DIAGNOSIS — R110 Nausea: Secondary | ICD-10-CM | POA: Diagnosis not present

## 2020-01-30 DIAGNOSIS — D510 Vitamin B12 deficiency anemia due to intrinsic factor deficiency: Secondary | ICD-10-CM | POA: Insufficient documentation

## 2020-01-30 DIAGNOSIS — D509 Iron deficiency anemia, unspecified: Secondary | ICD-10-CM | POA: Diagnosis not present

## 2020-01-30 DIAGNOSIS — R61 Generalized hyperhidrosis: Secondary | ICD-10-CM | POA: Diagnosis not present

## 2020-01-30 DIAGNOSIS — D7581 Myelofibrosis: Secondary | ICD-10-CM | POA: Insufficient documentation

## 2020-01-30 DIAGNOSIS — R109 Unspecified abdominal pain: Secondary | ICD-10-CM | POA: Diagnosis not present

## 2020-01-30 DIAGNOSIS — D500 Iron deficiency anemia secondary to blood loss (chronic): Secondary | ICD-10-CM | POA: Diagnosis not present

## 2020-01-30 DIAGNOSIS — R42 Dizziness and giddiness: Secondary | ICD-10-CM | POA: Insufficient documentation

## 2020-01-30 DIAGNOSIS — R5383 Other fatigue: Secondary | ICD-10-CM | POA: Insufficient documentation

## 2020-01-30 DIAGNOSIS — M791 Myalgia, unspecified site: Secondary | ICD-10-CM | POA: Insufficient documentation

## 2020-01-30 DIAGNOSIS — R197 Diarrhea, unspecified: Secondary | ICD-10-CM | POA: Diagnosis not present

## 2020-01-30 DIAGNOSIS — R0602 Shortness of breath: Secondary | ICD-10-CM | POA: Insufficient documentation

## 2020-01-30 DIAGNOSIS — Z7982 Long term (current) use of aspirin: Secondary | ICD-10-CM | POA: Insufficient documentation

## 2020-01-30 DIAGNOSIS — D473 Essential (hemorrhagic) thrombocythemia: Secondary | ICD-10-CM | POA: Insufficient documentation

## 2020-01-30 DIAGNOSIS — M2550 Pain in unspecified joint: Secondary | ICD-10-CM | POA: Insufficient documentation

## 2020-01-30 DIAGNOSIS — M25511 Pain in right shoulder: Secondary | ICD-10-CM | POA: Diagnosis not present

## 2020-01-30 DIAGNOSIS — D75839 Thrombocytosis, unspecified: Secondary | ICD-10-CM

## 2020-01-30 DIAGNOSIS — G8929 Other chronic pain: Secondary | ICD-10-CM

## 2020-01-30 DIAGNOSIS — Z79899 Other long term (current) drug therapy: Secondary | ICD-10-CM | POA: Diagnosis not present

## 2020-01-30 DIAGNOSIS — R12 Heartburn: Secondary | ICD-10-CM | POA: Diagnosis not present

## 2020-01-30 LAB — CMP (CANCER CENTER ONLY)
ALT: 36 U/L (ref 0–44)
AST: 45 U/L — ABNORMAL HIGH (ref 15–41)
Albumin: 4.2 g/dL (ref 3.5–5.0)
Alkaline Phosphatase: 124 U/L (ref 38–126)
Anion gap: 6 (ref 5–15)
BUN: 12 mg/dL (ref 8–23)
CO2: 31 mmol/L (ref 22–32)
Calcium: 9.2 mg/dL (ref 8.9–10.3)
Chloride: 102 mmol/L (ref 98–111)
Creatinine: 0.94 mg/dL (ref 0.44–1.00)
GFR, Est AFR Am: >60 mL/min (ref >60)
GFR, Estimated: >60 mL/min (ref >60)
Glucose, Bld: 105 mg/dL — ABNORMAL HIGH (ref 70–99)
Potassium: 3.8 mmol/L (ref 3.5–5.1)
Sodium: 139 mmol/L (ref 135–145)
Total Bilirubin: 0.4 mg/dL (ref 0.3–1.2)
Total Protein: 8 g/dL (ref 6.5–8.1)

## 2020-01-30 LAB — VITAMIN B12: Vitamin B-12: 4848 pg/mL — ABNORMAL HIGH (ref 180–914)

## 2020-01-30 LAB — CBC WITH DIFFERENTIAL (CANCER CENTER ONLY)
Abs Immature Granulocytes: 0.02 10*3/uL (ref 0.00–0.07)
Basophils Absolute: 0 10*3/uL (ref 0.0–0.1)
Basophils Relative: 1 %
Eosinophils Absolute: 0.3 10*3/uL (ref 0.0–0.5)
Eosinophils Relative: 8 %
HCT: 36.9 % (ref 36.0–46.0)
Hemoglobin: 11.7 g/dL — ABNORMAL LOW (ref 12.0–15.0)
Immature Granulocytes: 1 %
Lymphocytes Relative: 20 %
Lymphs Abs: 0.9 10*3/uL (ref 0.7–4.0)
MCH: 33.2 pg (ref 26.0–34.0)
MCHC: 31.7 g/dL (ref 30.0–36.0)
MCV: 104.8 fL — ABNORMAL HIGH (ref 80.0–100.0)
Monocytes Absolute: 0.4 10*3/uL (ref 0.1–1.0)
Monocytes Relative: 8 %
Neutro Abs: 2.7 10*3/uL (ref 1.7–7.7)
Neutrophils Relative %: 62 %
Platelet Count: 545 10*3/uL — ABNORMAL HIGH (ref 150–400)
RBC: 3.52 MIL/uL — ABNORMAL LOW (ref 3.87–5.11)
RDW: 14.7 % (ref 11.5–15.5)
WBC Count: 4.3 10*3/uL (ref 4.0–10.5)
nRBC: 0 % (ref 0.0–0.2)

## 2020-01-30 LAB — LACTATE DEHYDROGENASE: LDH: 248 U/L — ABNORMAL HIGH (ref 98–192)

## 2020-01-30 LAB — SAVE SMEAR(SSMR), FOR PROVIDER SLIDE REVIEW

## 2020-01-30 MED ORDER — OXYCODONE-ACETAMINOPHEN 10-325 MG PO TABS: 1.0000 | ORAL_TABLET | Freq: Four times a day (QID) | ORAL | 0 refills | Status: DC | PRN

## 2020-01-30 NOTE — Progress Notes (Signed)
Hematology and Oncology Follow Up Visit  Rachael Johnson [id number] August 26, 1950 69 y.o. 01/30/2020   Principle Diagnosis:  Post-ET myelofibrosis Essential thrombocythemia - JAK2 negative -- NGS (-) Intermittent iron-deficiency anemia Pernicious Anemia  Past Therapy: Hydrea 1000/1000/500 mg by mouth daily - on hold starting 05/14/2017 - DC'd on 05/28/2017  Current Therapy:     Aspirin 81 mg by mouth daily IV iron as indicated - last dose given on 08/16/2018 PEG-Interferon 135 mcg sq q week --  Changed on 032/09/2019 -  Started Peg -Ifn on 03/17/2019 Vit B12 1000 mcg IM q month -- start on 10/31/2019  HISTORY: Rachael Johnson is back for follow-up.  [birth year] is her birthday.  She plans on just being home and being by herself.  I know her family probably will plan something for her.  Thankfully, the diarrhea has stopped.  This definitely has made her life better.  She has had no problems with fever.  There have been no bleeding.  She has had no cough or shortness of breath.  She is doing well with the PEG interferon.  She has had no rashes.  There is still the pain in the right shoulder where she had a injury.  Overall, her performance status is ECOG 1.     Medications:  Allergies as of 01/30/2020   No Known Allergies     Medication List       Accurate as of January 30, 2020 12:48 PM. If you have any questions, ask your nurse or doctor.        albuterol 108 (90 Base) MCG/ACT inhaler Commonly known as: VENTOLIN HFA INHALE 2 PUFFS BY MOUTH EVERY 6 HOURS AS NEEDED FOR WHEEZING OR SHORTNESS OF BREATH   Aller-Chlor 4 MG tablet Generic drug: chlorpheniramine TAKE 2 TABLETS BY MOUTH EVERY NIGHT AT BEDTIME AS NEEDED FOR ALLERGIES   ALPRAZolam 1 MG tablet Commonly known as: XANAX TAKE 1 TABLET BY MOUTH THREE TIMES DAILY AS NEEDED FOR ANXIETY   atorvastatin 10 MG tablet Commonly known as: LIPITOR Take 1 tablet (10 mg total) by mouth daily.   budesonide-formoterol 80-4.5 MCG/ACT  inhaler Commonly known as: Symbicort Inhale 2 puffs into the lungs 2 (two) times daily.   busPIRone 15 MG tablet Commonly known as: BUSPAR TAKE (1) TABLET BY MOUTH FOUR TIMES A DAY AS NEEDED   Cranberry 300 MG tablet Take 300 mg by mouth 2 (two) times daily.   cyanocobalamin 1000 MCG/ML injection Commonly known as: (VITAMIN B-12) Inject 1 ml into muscle q week x 4 then q monthly   cyclobenzaprine 10 MG tablet Commonly known as: FLEXERIL TAKE 1 TABLET BY MOUTH TWICE DAILY AS NEEDED FOR MUSCLE SPASMS   diphenhydrAMINE 12.5 MG/5ML liquid Commonly known as: EQ Allergy Relief Childrens TAKE 5 ML BY MOUTH THREE TIMES DAILY AS NEEDED FOR MOUTH  PAIN   estradiol 0.5 MG tablet Commonly known as: ESTRACE TAKE ONE (1) TABLET BY MOUTH TWICE DAILY   famotidine 40 MG tablet Commonly known as: PEPCID Take 1 tablet (40 mg total) by mouth daily.   fluconazole 150 MG tablet Commonly known as: DIFLUCAN TAKE (1) TABLET BY MOUTH ONCE WEEKLY   fluticasone 50 MCG/ACT nasal spray Commonly known as: FLONASE INSTILL TWO (2) SPRAYS IN EACH NOSTRIL ONCE DAILY   furosemide 20 MG tablet Commonly known as: LASIX TAKE 1 TABLET BY MOUTH 3 TIMES DAILY AS NEEDED FOR EDEMA   gabapentin 400 MG capsule Commonly known as: NEURONTIN Take 1 capsule (400 mg total) by  mouth daily.   gabapentin 100 MG capsule Commonly known as: NEURONTIN TAKE FOUR (4) CAPSULES BY MOUTH EVERY NIGHT AT BEDTIME   levothyroxine 100 MCG tablet Commonly known as: SYNTHROID TAKE 1 TABLET BY MOUTH EVERY MORNING BEFORE BREAKFAST   magic mouthwash Soln Take 5 mLs by mouth 3 (three) times daily as needed for mouth pain. Components benadryl  525 mg, hydrocortisone 60 mg and nystatin 0.6 mg. 240 ml - Oral   meclizine 12.5 MG tablet Commonly known as: ANTIVERT TAKE 1 TABLET BY MOUTH THREE TIMES DAILY AS NEEDED FOR DIZZINESS   montelukast 10 MG tablet Commonly known as: SINGULAIR TAKE 1 TABLET BY MOUTH AT BEDTIME     MULTIVITAMIN PO Take 1 tablet by mouth every morning.   nystatin 100000 UNIT/ML suspension Commonly known as: MYCOSTATIN Take 5 mLs (500,000 Units total) by mouth 4 (four) times daily.   ondansetron 8 MG tablet Commonly known as: ZOFRAN TAKE 1/2-1 TABLET BY MOUTH EVERY 8 HOURS AS NEEDED FOR NAUSEA AND VOMITING   optichamber diamond Misc USE as directed. Use with inhaler   oxyCODONE-acetaminophen 10-325 MG tablet Commonly known as: PERCOCET Take 1 tablet by mouth every 6 (six) hours as needed for pain.   Pegasys 180 MCG/ML injection Generic drug: peginterferon alfa-2a INJECT 0.25 MLS (45 MCG TOTAL) INTO THE SKIN EVERY 7 (SEVEN) DAYS. SINGLE USE VIALS, AFTER INJECTION DISPOSE OF REMAINING VIAL CONTENT.   Restasis 0.05 % ophthalmic emulsion Generic drug: cycloSPORINE PLACE ONE DROP INTO EACH EYE EVERY 12 HOURS   Sancuso 3.1 MG/24HR Generic drug: granisetron APPLY 1 PATCH TOPICALLY AS NEEDED FOR NAUSEA, REMOVE AFTER 7 DAYS   SSD 1 % cream Generic drug: silver sulfADIAZINE APPLY TOPICALLY DAILY   traZODone 100 MG tablet Commonly known as: DESYREL TAKE 1 TABLET BY MOUTH AT BEDTIME   TUBERCULIN SYR 1CC/27GX1/2" 27G X 1/2" 1 ML Misc Commonly known as: B-D TB SYRINGE 1CC/27GX1/2" USE AS DIRECTED ONCE WEEKLY FOR PEG-INTERFERON INJECTIONS   venlafaxine XR 75 MG 24 hr capsule Commonly known as: EFFEXOR-XR TAKE 1 CAPSULE BY MOUTH THREE TIMES DAILY       Allergies: No Known Allergies  Past Medical History, Surgical history, Social history, and Family History were reviewed and updated.  Review of Systems: Review of Systems  Constitutional: Positive for diaphoresis and malaise/fatigue.  HENT: Negative.   Eyes: Negative.   Respiratory: Positive for shortness of breath.   Cardiovascular: Negative.   Gastrointestinal: Positive for abdominal pain, heartburn and nausea.  Genitourinary: Negative.   Musculoskeletal: Positive for joint pain and myalgias.  Skin: Negative.    Neurological: Positive for dizziness.  Endo/Heme/Allergies: Negative.   Psychiatric/Behavioral: Negative.      Physical Exam:  height is 5\' 3"  (1.6 m) and weight is 144 lb 3.2 oz (65.4 kg). Her oral temperature is 98.9 F (37.2 C). Her blood pressure is 131/90 and her pulse is 94. Her respiration is 18 and oxygen saturation is 98%.   Wt Readings from Last 3 Encounters:  01/30/20 144 lb 3.2 oz (65.4 kg)  12/19/19 145 lb (65.8 kg)  10/31/19 147 lb (66.7 kg)    Physical Exam Vitals reviewed.  HENT:     Head: Normocephalic and atraumatic.  Eyes:     Pupils: Pupils are equal, round, and reactive to light.  Cardiovascular:     Rate and Rhythm: Normal rate and regular rhythm.     Heart sounds: Normal heart sounds.  Pulmonary:     Effort: Pulmonary effort is normal.  Breath sounds: Normal breath sounds.  Abdominal:     General: Bowel sounds are normal.     Palpations: Abdomen is soft.     Comments: Her spleen tip is palpable a couple centimeters below the left costal margin.  Musculoskeletal:        General: No tenderness or deformity. Normal range of motion.     Cervical back: Normal range of motion.  Lymphadenopathy:     Cervical: No cervical adenopathy.  Skin:    General: Skin is warm and dry.     Findings: No erythema or rash.  Neurological:     Mental Status: She is alert and oriented to person, place, and time.  Psychiatric:        Behavior: Behavior normal.        Thought Content: Thought content normal.        Judgment: Judgment normal.      Lab Results  Component Value Date   WBC 4.3 01/30/2020   HGB 11.7 (L) 01/30/2020   HCT 36.9 01/30/2020   MCV 104.8 (H) 01/30/2020   PLT 545 (H) 01/30/2020   Lab Results  Component Value Date   FERRITIN 2,884 (H) 12/19/2019   IRON 102 12/19/2019   TIBC 204 (L) 12/19/2019   UIBC 102 (L) 12/19/2019   IRONPCTSAT 50 12/19/2019   Lab Results  Component Value Date   RETICCTPCT 2.1 12/19/2019   RBC 3.52 (L)  01/30/2020   RETICCTABS 45.8 06/02/2015   No results found for: KPAFRELGTCHN, LAMBDASER, KAPLAMBRATIO No results found for: IGGSERUM, IGA, IGMSERUM No results found for: Kathrynn Ducking, MSPIKE, SPEI   Chemistry      Component Value Date/Time   NA 139 01/30/2020 1142   NA 143 07/16/2017 1057   NA 140 11/29/2016 0936   K 3.8 01/30/2020 1142   K 3.2 (L) 07/16/2017 1057   K 4.2 11/29/2016 0936   CL 102 01/30/2020 1142   CL 104 07/16/2017 1057   CO2 31 01/30/2020 1142   CO2 25 07/16/2017 1057   CO2 27 11/29/2016 0936   BUN 12 01/30/2020 1142   BUN 10 07/16/2017 1057   BUN 18.3 11/29/2016 0936   CREATININE 0.94 01/30/2020 1142   CREATININE 0.8 07/16/2017 1057   CREATININE 0.8 11/29/2016 0936      Component Value Date/Time   CALCIUM 9.2 01/30/2020 1142   CALCIUM 8.9 07/16/2017 1057   CALCIUM 8.9 11/29/2016 0936   ALKPHOS 124 01/30/2020 1142   ALKPHOS 71 07/16/2017 1057   ALKPHOS 77 11/29/2016 0936   AST 45 (H) 01/30/2020 1142   AST 19 11/29/2016 0936   ALT 36 01/30/2020 1142   ALT 27 07/16/2017 1057   ALT 10 11/29/2016 0936   BILITOT 0.4 01/30/2020 1142   BILITOT <0.22 11/29/2016 0936      Impression and Plan: Ms. Uehara is a very pleasant 69 yo caucasian female with essential thrombocythemia.   As expected, it is no surprise that her platelet count is going up a little bit.  I am not going to make any changes with the PEG-interferon dosing.  This is all about her quality of life.  I noted that she will have a wonderful birthday.  I gave her a box of chocolate covered cherries which she and her son both like.  I will plan to get her back to see [place] in another 4-5 weeks.  [name], MD 7/2/202112:48 PM

## 2020-02-03 LAB — IRON AND TIBC
Iron: 85 ug/dL (ref 41–142)
Saturation Ratios: 44 % (ref 21–57)
TIBC: 193 ug/dL — ABNORMAL LOW (ref 236–444)
UIBC: 107 ug/dL — ABNORMAL LOW (ref 120–384)

## 2020-02-03 LAB — FERRITIN: Ferritin: 3203 ng/mL — ABNORMAL HIGH (ref 11–307)

## 2020-02-05 ENCOUNTER — Telehealth (INDEPENDENT_AMBULATORY_CARE_PROVIDER_SITE_OTHER): Payer: Medicare Other | Admitting: Family Medicine

## 2020-02-05 ENCOUNTER — Telehealth: Payer: Medicare Other | Admitting: Family Medicine

## 2020-02-05 DIAGNOSIS — E559 Vitamin D deficiency, unspecified: Secondary | ICD-10-CM

## 2020-02-05 DIAGNOSIS — I10 Essential (primary) hypertension: Secondary | ICD-10-CM

## 2020-02-05 DIAGNOSIS — J45991 Cough variant asthma: Secondary | ICD-10-CM

## 2020-02-05 DIAGNOSIS — E785 Hyperlipidemia, unspecified: Secondary | ICD-10-CM | POA: Diagnosis not present

## 2020-02-05 DIAGNOSIS — R739 Hyperglycemia, unspecified: Secondary | ICD-10-CM | POA: Diagnosis not present

## 2020-02-05 MED ORDER — AMOXICILLIN 500 MG PO CAPS: 500.0000 mg | ORAL_CAPSULE | Freq: Three times a day (TID) | ORAL | 0 refills | Status: DC

## 2020-02-05 MED ORDER — METHYLPREDNISOLONE 4 MG PO TABS: ORAL_TABLET | ORAL | 0 refills | Status: DC

## 2020-02-09 NOTE — Assessment & Plan Note (Signed)
Monitor and report any concerns, no changes to meds. Encouraged heart healthy diet such as the DASH diet and exercise as tolerated.  ?

## 2020-02-09 NOTE — Progress Notes (Addendum)
Virtual Visit via Video Note  I connected with Rachael Johnson on 02/05/20 at  2:20 PM EDT by a video enabled telemedicine application and verified that I am speaking with the correct person using two identifiers.  Location: Patient: home, patient and provider in visit Provider: office   I discussed the limitations of evaluation and management by telemedicine and the availability of in person appointments. The patient expressed understanding and agreed to proceed. Kem Boroughs, CMA was able to get the patient set up on a video visit.    Subjective:    Patient ID: Rachael Johnson, female    DOB: 11/13/1950, 69 y.o.   MRN: [id number]  Chief Complaint  Patient presents with  . Follow-up  . Diarrhea    HPI Patient is in today for follow up on chronic medical concerns. She notes she has had an increased cough, congestion, malaise, fatigue, myalgias this week. No fevers or chills but she has noted several loose stool daily. No bloody or tarry loose stool. Denies CP/palp/HA/fevers/GI or GU c/o. Taking meds as prescribed  Past Medical History:  Diagnosis Date  . Acute renal insufficiency 04/09/2017  . Anemia, iron deficiency 07/08/2012  . Arthritis   . Cataract 12/21/2014  . COPD (chronic obstructive pulmonary disease) ([place]) 01/23/2017   01/23/2017   Walked RA  2 laps @ 185 ft each stopped due to  Cough > > sob/ no desat nl pace  . Depression with anxiety 03/24/2011  . Diverticulitis   . Esophageal reflux 08/17/2013  . Essential thrombocythemia ([place]) 01/24/2011  . Frequent episodic tension-type headache   . Gastroenteritis 12/21/2014  . 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 GERD (gastroesophageal reflux disease)   . H. pylori infection 12/19/2012  . Hyperglycemia 12/17/2015  . Hypertension   . Iron deficiency anemia due to chronic blood loss 04/03/2017  . Osteoarthritis 05/24/2017  . Other and unspecified hyperlipidemia 02/25/2013  . Restless leg syndrome 09/30/2014  . Rhabdomyolysis 02/2017  .  Scabies 03/28/2015  . 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 Seizure ([place])    childhood  . Shoulder wound, right, sequela 03/14/2017  . Thyroid disease     Past Surgical History:  Procedure Laterality Date  . ABDOMINAL HYSTERECTOMY     1992  . BREAST BIOPSY    . BREAST CYST ASPIRATION    . BREAST SURGERY  1992   biopsy, benign. Fibrocystic  . UMBILICAL HERNIA REPAIR N/A 09/25/2012   Procedure: HERNIA REPAIR UMBILICAL ADULT;  Surgeon: Harl Bowie, MD;  Location: [facility];  Service: General;  Laterality: N/A;    Family History  Problem Relation Age of Onset  . Arthritis Mother   . Cancer Mother        ovarian  . Hypertension Mother   . Heart disease Mother        pacer  . Heart failure Mother   . Arthritis Father   . Cancer Father        lung  . Hypertension Father   . Cancer Sister        lung  . Cancer Brother        prostate  . Cancer Brother        lung  . Hypertension Son   . COPD Brother   . Heart disease Brother        died from CHF  . Hypertension Brother   . Cancer Brother        colon  . Alcohol abuse Brother   .  Cirrhosis Brother   . Seizures Sister   . Stroke Sister   . Arthritis Brother     Social History   Socioeconomic History  . Marital status: Widowed    Spouse name: Not on file  . Number of children: 2  . Years of education: Not on file  . Highest education level: Not on file  Occupational History  . Occupation: APL IT sales professional: [employer]    Comment: cotton [employer]  Tobacco Use  . Smoking status: Former Smoker    Packs/day: 3.50    Years: 20.00    Pack years: 70.00    Types: Cigarettes    Start date: 11/15/1970    Quit date: 07/31/1990    Years since quitting: 29.5  . Smokeless tobacco: Never Used  . Tobacco comment: quit 23 years ago  Vaping Use  . Vaping Use: Never used  Substance and Sexual Activity  . Alcohol use: No    Alcohol/week: 0.0 standard drinks  . Drug use: No  . Sexual activity: Never  Other Topics  Concern  . Not on file  Social History Narrative   Regular exercise: no   Caffeine Use: 2-3 weekly   Social Determinants of Health   Financial Resource Strain: Low Risk   . Difficulty of Paying Living Expenses: Not hard at all  Food Insecurity: No Food Insecurity  . Worried About Charity fundraiser in the Last Year: Never true  . Ran Out of Food in the Last Year: Never true  Transportation Needs: No Transportation Needs  . Lack of Transportation (Medical): No  . Lack of Transportation (Non-Medical): No  Physical Activity:   . Days of Exercise per Week:   . Minutes of Exercise per Session:   Stress:   . Feeling of Stress :   Social Connections:   . Frequency of Communication with Friends and Family:   . Frequency of Social Gatherings with Friends and Family:   . Attends Religious Services:   . Active Member of Clubs or Organizations:   . Attends Archivist Meetings:   Marland Kitchen Marital Status:   Intimate Partner Violence:   . Fear of Current or Ex-Partner:   . Emotionally Abused:   Marland Kitchen Physically Abused:   . Sexually Abused:     Outpatient Medications Prior to Visit  Medication Sig Dispense Refill  . albuterol (VENTOLIN HFA) 108 (90 Base) MCG/ACT inhaler INHALE 2 PUFFS BY MOUTH EVERY 6 HOURS AS NEEDED FOR WHEEZING OR SHORTNESS OF BREATH 54 g 0  . ALLER-CHLOR 4 MG tablet TAKE 2 TABLETS BY MOUTH EVERY NIGHT AT BEDTIME AS NEEDED FOR ALLERGIES 30 tablet 1  . ALPRAZolam (XANAX) 1 MG tablet TAKE 1 TABLET BY MOUTH THREE TIMES DAILY AS NEEDED FOR ANXIETY 90 tablet 4  . atorvastatin (LIPITOR) 10 MG tablet Take 1 tablet (10 mg total) by mouth daily. 90 tablet 3  . budesonide-formoterol (SYMBICORT) 80-4.5 MCG/ACT inhaler Inhale 2 puffs into the lungs 2 (two) times daily. 30.6 g 1  . busPIRone (BUSPAR) 15 MG tablet TAKE (1) TABLET BY MOUTH FOUR TIMES A DAY AS NEEDED 120 tablet 4  . Cranberry 300 MG tablet Take 300 mg by mouth 2 (two) times daily.    . cyanocobalamin (,VITAMIN B-12,)  1000 MCG/ML injection Inject 1 ml into muscle q week x 4 then q monthly 10 mL 2  . cyclobenzaprine (FLEXERIL) 10 MG tablet Take 1 tablet (10 mg total) by mouth 2 (two) times daily as  needed for muscle spasms. 60 tablet 3  . diphenhydrAMINE (EQ ALLERGY RELIEF CHILDRENS) 12.5 MG/5ML liquid TAKE 5 ML BY MOUTH THREE TIMES DAILY AS NEEDED FOR MOUTH  PAIN 240 mL 1  . estradiol (ESTRACE) 0.5 MG tablet TAKE ONE (1) TABLET BY MOUTH TWICE DAILY 60 tablet 1  . famotidine (PEPCID) 40 MG tablet Take 1 tablet (40 mg total) by mouth daily. 90 tablet 1  . fluconazole (DIFLUCAN) 150 MG tablet TAKE (1) TABLET BY MOUTH ONCE WEEKLY 2 tablet 10  . fluticasone (FLONASE) 50 MCG/ACT nasal spray INSTILL TWO (2) SPRAYS IN EACH NOSTRIL ONCE DAILY 16 g 2  . furosemide (LASIX) 20 MG tablet TAKE 1 TABLET BY MOUTH 3 TIMES DAILY AS NEEDED FOR EDEMA 90 tablet 10  . gabapentin (NEURONTIN) 100 MG capsule TAKE FOUR (4) CAPSULES BY MOUTH EVERY NIGHT AT BEDTIME 120 capsule 10  . gabapentin (NEURONTIN) 400 MG capsule Take 1 capsule (400 mg total) by mouth daily. 90 capsule 1  . granisetron (SANCUSO) 3.1 MG/24HR APPLY 1 PATCH TOPICALLY AS NEEDED FOR NAUSEA, REMOVE AFTER 7 DAYS 4 patch 4  . levothyroxine (SYNTHROID) 100 MCG tablet TAKE 1 TABLET BY MOUTH EVERY MORNING BEFORE BREAKFAST 30 tablet 1  . magic mouthwash SOLN Take 5 mLs by mouth 3 (three) times daily as needed for mouth pain. Components benadryl  525 mg, hydrocortisone 60 mg and nystatin 0.6 mg. 240 ml - Oral 240 mL 3  . meclizine (ANTIVERT) 12.5 MG tablet TAKE 1 TABLET BY MOUTH THREE TIMES DAILY AS NEEDED FOR DIZZINESS 40 tablet 10  . montelukast (SINGULAIR) 10 MG tablet TAKE 1 TABLET BY MOUTH AT BEDTIME 30 tablet 1  . Multiple Vitamin (MULTIVITAMIN PO) Take 1 tablet by mouth every morning.     . nystatin (MYCOSTATIN) 100000 UNIT/ML suspension Take 5 mLs (500,000 Units total) by mouth 4 (four) times daily. 60 mL 3  . ondansetron (ZOFRAN) 8 MG tablet TAKE 1/2-1 TABLET BY MOUTH  EVERY 8 HOURS AS NEEDED FOR NAUSEA AND VOMITING 90 tablet 10  . oxyCODONE-acetaminophen (PERCOCET) 10-325 MG tablet Take 1 tablet by mouth every 6 (six) hours as needed for pain. 120 tablet 0  . PEGASYS 180 MCG/ML injection INJECT 0.25 MLS (45 MCG TOTAL) INTO THE SKIN EVERY 7 (SEVEN) DAYS. SINGLE USE VIALS, AFTER INJECTION DISPOSE OF REMAINING VIAL CONTENT. 4 mL 6  . RESTASIS 0.05 % ophthalmic emulsion PLACE ONE DROP INTO EACH EYE EVERY 12 HOURS 360 each 3  . Spacer/Aero-Holding Chambers (OPTICHAMBER DIAMOND) MISC USE as directed. Use with inhaler 1 each 0  . SSD 1 % cream APPLY TOPICALLY DAILY 50 g 0  . traZODone (DESYREL) 100 MG tablet TAKE 1 TABLET BY MOUTH AT BEDTIME 30 tablet 10  . TUBERCULIN SYR 1CC/27GX1/2" (B-D TB SYRINGE 1CC/27GX1/2") 27G X 1/2" 1 ML MISC USE AS DIRECTED ONCE WEEKLY FOR PEG-INTERFERON INJECTIONS 4 each 11  . venlafaxine XR (EFFEXOR-XR) 75 MG 24 hr capsule TAKE 1 CAPSULE BY MOUTH THREE TIMES DAILY 90 capsule 10   No facility-administered medications prior to visit.    No Known Allergies  Review of Systems  Constitutional: Positive for malaise/fatigue. Negative for fever.  HENT: Positive for congestion.   Eyes: Negative for blurred vision.  Respiratory: Positive for cough, sputum production and shortness of breath.   Cardiovascular: Negative for chest pain, palpitations and leg swelling.  Gastrointestinal: Negative for abdominal pain, blood in stool and nausea.  Genitourinary: Negative for dysuria and frequency.  Musculoskeletal: Positive for myalgias. Negative for  falls.  Skin: Negative for rash.  Neurological: Negative for dizziness, loss of consciousness and headaches.  Endo/Heme/Allergies: Negative for environmental allergies.  Psychiatric/Behavioral: Negative for depression. The patient is not nervous/anxious.        Objective:    Physical Exam Constitutional:      Appearance: Normal appearance. She is not ill-appearing.  HENT:     Head: Normocephalic  and atraumatic.     Right Ear: External ear normal.     Left Ear: External ear normal.     Nose: Nose normal.  Eyes:     General:        Right eye: No discharge.        Left eye: No discharge.  Pulmonary:     Effort: Pulmonary effort is normal.  Neurological:     Mental Status: She is alert and oriented to person, place, and time.  Psychiatric:        Behavior: Behavior normal.     BP 138/90   Pulse 99   Wt 144 lb (65.3 kg)   LMP 07/31/1990   BMI 25.51 kg/m  Wt Readings from Last 3 Encounters:  02/05/20 144 lb (65.3 kg)  01/30/20 144 lb 3.2 oz (65.4 kg)  12/19/19 145 lb (65.8 kg)    Diabetic Foot Exam - Simple   No data filed     Lab Results  Component Value Date   WBC 4.3 01/30/2020   HGB 11.7 (L) 01/30/2020   HCT 36.9 01/30/2020   PLT 545 (H) 01/30/2020   GLUCOSE 105 (H) 01/30/2020   CHOL 236 (H) 05/06/2018   TRIG 322.0 (H) 05/06/2018   HDL 59.00 05/06/2018   LDLDIRECT 95.0 05/06/2018   LDLCALC 82 02/12/2017   ALT 36 01/30/2020   AST 45 (H) 01/30/2020   NA 139 01/30/2020   K 3.8 01/30/2020   CL 102 01/30/2020   CREATININE 0.94 01/30/2020   BUN 12 01/30/2020   CO2 31 01/30/2020   TSH 0.12 (L) 10/09/2019   INR 0.94 09/16/2018   HGBA1C 5.0 08/10/2017    Lab Results  Component Value Date   TSH 0.12 (L) 10/09/2019   Lab Results  Component Value Date   WBC 4.3 01/30/2020   HGB 11.7 (L) 01/30/2020   HCT 36.9 01/30/2020   MCV 104.8 (H) 01/30/2020   PLT 545 (H) 01/30/2020   Lab Results  Component Value Date   NA 139 01/30/2020   K 3.8 01/30/2020   CHLORIDE 105 11/29/2016   CO2 31 01/30/2020   GLUCOSE 105 (H) 01/30/2020   BUN 12 01/30/2020   CREATININE 0.94 01/30/2020   BILITOT 0.4 01/30/2020   ALKPHOS 124 01/30/2020   AST 45 (H) 01/30/2020   ALT 36 01/30/2020   PROT 8.0 01/30/2020   ALBUMIN 4.2 01/30/2020   CALCIUM 9.2 01/30/2020   ANIONGAP 6 01/30/2020   EGFR 80 (L) 11/29/2016   GFR 62.31 10/29/2019   Lab Results  Component Value  Date   CHOL 236 (H) 05/06/2018   Lab Results  Component Value Date   HDL 59.00 05/06/2018   Lab Results  Component Value Date   LDLCALC 82 02/12/2017   Lab Results  Component Value Date   TRIG 322.0 (H) 05/06/2018   Lab Results  Component Value Date   CHOLHDL 4 05/06/2018   Lab Results  Component Value Date   HGBA1C 5.0 08/10/2017       Assessment & Plan:   Problem List Items Addressed This Visit  Hyperlipidemia    Encouraged heart healthy diet, increase exercise, avoid trans fats, consider a krill oil cap daily      Hyperglycemia    hgba1c acceptable, minimize simple carbs. Increase exercise as tolerated.       Vitamin D deficiency    supplement and monitor      Essential hypertension    Monitor and report any concerns, no changes to meds. Encouraged heart healthy diet such as the DASH diet and exercise as tolerated.       Cough variant asthma vs UACS    Has been flared this week. Started on Zpak and steroids. Use Mucinex and probiotics, Encouraged increased rest and hydration, add probiotics, zinc such as Coldeze or Xicam. Treat fevers as needed      Relevant Medications   methylPREDNISolone (MEDROL) 4 MG tablet      I am having Rachael Johnson start on methylPREDNISolone and amoxicillin. I am also having her maintain her Multiple Vitamin (MULTIVITAMIN PO), Cranberry, SSD, magic mouthwash, optichamber diamond, atorvastatin, famotidine, gabapentin, albuterol, Restasis, budesonide-formoterol, nystatin, ondansetron, fluticasone, TUBERCULIN SYR 1CC/27GX1/2", furosemide, venlafaxine XR, ALPRAZolam, gabapentin, meclizine, traZODone, Sancuso, diphenhydrAMINE, cyanocobalamin, Pegasys, montelukast, Aller-Chlor, busPIRone, fluconazole, levothyroxine, estradiol, oxyCODONE-acetaminophen, and cyclobenzaprine.  Meds ordered this encounter  Medications  . methylPREDNISolone (MEDROL) 4 MG tablet    Sig: 5 tab po qd X 1d then 4 tab po qd X 1d then 3 tab po qd X 1d then 2  tab po qd then 1 tab po qd    Dispense:  15 tablet    Refill:  0  . amoxicillin (AMOXIL) 500 MG capsule    Sig: Take 1 capsule (500 mg total) by mouth 3 (three) times daily.    Dispense:  21 capsule    Refill:  0     I discussed the assessment and treatment plan with the patient. The patient was provided an opportunity to ask questions and all were answered. The patient agreed with the plan and demonstrated an understanding of the instructions.   The patient was advised to call back or seek an in-person evaluation if the symptoms worsen or if the condition fails to improve as anticipated.  I provided 25 minutes of non-face-to-face time during this encounter.   Penni Homans, MD

## 2020-02-09 NOTE — Assessment & Plan Note (Signed)
hgba1c acceptable, minimize simple carbs. Increase exercise as tolerated.  

## 2020-02-09 NOTE — Assessment & Plan Note (Signed)
Encouraged heart healthy diet, increase exercise, avoid trans fats, consider a krill oil cap daily 

## 2020-02-09 NOTE — Assessment & Plan Note (Signed)
Has been flared this week. Started on Zpak and steroids. Use Mucinex and probiotics, Encouraged increased rest and hydration, add probiotics, zinc such as Coldeze or Xicam. Treat fevers as needed

## 2020-02-09 NOTE — Assessment & Plan Note (Signed)
supplement and monitor 

## 2020-02-12 MED FILL — BD TB SYRINGE 27GX1/2: 27G X 1/2" | 28 days supply | Qty: 4 | Fill #4

## 2020-02-12 MED FILL — PEGASYS 180 MCG/ML VIAL: 180 | 28 days supply | Qty: 4 | Fill #3

## 2020-02-13 ENCOUNTER — Other Ambulatory Visit: Payer: Self-pay | Admitting: Family Medicine

## 2020-02-13 ENCOUNTER — Ambulatory Visit (INDEPENDENT_AMBULATORY_CARE_PROVIDER_SITE_OTHER): Payer: Medicare Other | Admitting: Pulmonary Disease

## 2020-02-13 ENCOUNTER — Encounter: Payer: Self-pay | Admitting: Family Medicine

## 2020-02-13 ENCOUNTER — Encounter: Payer: Self-pay | Admitting: Pulmonary Disease

## 2020-02-13 ENCOUNTER — Other Ambulatory Visit: Payer: Self-pay

## 2020-02-13 VITALS — BP 134/84 | HR 90 | Temp 99.5°F | Ht 63.5 in | Wt 146.0 lb

## 2020-02-13 DIAGNOSIS — R1310 Dysphagia, unspecified: Secondary | ICD-10-CM | POA: Diagnosis not present

## 2020-02-13 DIAGNOSIS — J449 Chronic obstructive pulmonary disease, unspecified: Secondary | ICD-10-CM

## 2020-02-13 DIAGNOSIS — J479 Bronchiectasis, uncomplicated: Secondary | ICD-10-CM | POA: Diagnosis not present

## 2020-02-13 MED ORDER — SODIUM CHLORIDE 3 % IN NEBU
INHALATION_SOLUTION | Freq: Every day | RESPIRATORY_TRACT | 2 refills | Status: DC
Start: 2020-02-13 — End: 2020-02-16

## 2020-02-13 NOTE — Assessment & Plan Note (Addendum)
Even though she was a heavy smoker, CT does not show any evidence of emphysema. Spirometry did not show any evidence of airway obstruction Doubt Symbicort necessary here but will leave her on this for now given symptomatic benefit

## 2020-02-13 NOTE — Assessment & Plan Note (Addendum)
bronchiectasis  has not progressed since 2018, there is possibility of mycobacterial infection Schedule high-resolution CT scan of the chest Airway clearance will be the main strategy Prescription for nebulizer Hypertonic saline nebs once daily #60 with 2 refills  Main symptom is dry cough and hopefully this will help. Not sure Symbicort is contributing much or would leave her on this and she has been on this for many years  If disease progression, we will have to rule out MAC

## 2020-02-13 NOTE — Addendum Note (Signed)
Addended by: Merrilee Seashore on: 02/13/2020 10:56 AM   Modules accepted: Orders

## 2020-02-13 NOTE — Patient Instructions (Addendum)
  You have bronchiectasis , this has not progressed since 2018, there is possibility of mycobacterial infection   Schedule high-resolution CT scan of the chest @ [facility]  Prescription for nebulizer Hypertonic saline nebs once daily #60 with 2 refills  For swallowing problem, schedule esophagram

## 2020-02-13 NOTE — Telephone Encounter (Signed)
Last Alprazolam RX: 09/26/19, #90 x 4 refills Last OV: 02/05/20 Next OV: 02/20/20 UDS: 10/09/19 CSC: 04/15/19

## 2020-02-13 NOTE — Progress Notes (Signed)
Subjective:    Patient ID: Rachael Johnson, female    DOB: [birth year], 69 y.o.   MRN: [id number]  HPI  69 yo quit smoking  1992 presents to establish care for bronchiectasis and "COPD   She reports dyspnea for at least 3 years and has been diagnosed with "COPD". She has 3 pillow orthopnea. She reports dry cough especially worse at night . Symptoms are worse in the hot weather. She has been maintained on a regimen of Symbicort and albuterol which he uses almost every 6 hours and states that this helps symptomatically. She does report that when she catches a chest cold this was difficult to treat   I have reviewed prior evaluation by my partner Dr. 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 Relatively diffuse tree-in-bud nodularity is evident in the posterior right upper lobe, lingula, and both lower lobes. This appearance is relatively stable comparing back to 01/11/2017  CT chest  10/21/16   - Spirometry 11/27/2016 no airway obstruction, ratio 81, FEV1 85%, FVC 90% -CT chest 01/11/2017 >Slight progression of what appears to be a chronic indolent atypical infectious process such is mycobacterium avium intracellulare (MAI) compared to prior study 10/21/2016. 2. Larger nodules seen in the right upper lobe on the prior study are far less distinct on today's  examination, likely part of the same underlying chronic infectious process.    Past Medical History:  Diagnosis Date  . Acute renal insufficiency 04/09/2017  . Anemia, iron deficiency 07/08/2012  . Arthritis   . Cataract 12/21/2014  . COPD (chronic obstructive pulmonary disease) ([place]) 01/23/2017   01/23/2017   Walked RA  2 laps @ 185 ft each stopped due to  Cough > > sob/ no desat nl pace  . Depression with anxiety 03/24/2011  . Diverticulitis   . Esophageal reflux 08/17/2013  . Essential thrombocythemia ([place]) 01/24/2011  . Frequent episodic tension-type headache   . Gastroenteritis 12/21/2014  . 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 GERD (gastroesophageal reflux disease)   . H. pylori infection 12/19/2012  . Hyperglycemia 12/17/2015  . Hypertension   . Iron deficiency anemia due to chronic blood loss 04/03/2017  . Osteoarthritis 05/24/2017  . Other and unspecified hyperlipidemia 02/25/2013  . Restless leg syndrome 09/30/2014  . Rhabdomyolysis 02/2017  . Scabies 03/28/2015  . 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 Seizure ([place])    childhood  . Shoulder wound, right, sequela 03/14/2017  . Thyroid disease     Past Surgical History:  Procedure Laterality Date  . ABDOMINAL HYSTERECTOMY     1992  . BREAST BIOPSY    . BREAST CYST ASPIRATION    . BREAST SURGERY  1992   biopsy, benign. Fibrocystic  . UMBILICAL HERNIA REPAIR N/A 09/25/2012   Procedure: HERNIA REPAIR UMBILICAL ADULT;  Surgeon: Harl Bowie, MD;  Location: [facility];  Service: General;  Laterality: N/A;    No Known Allergies  Social History   Socioeconomic History  . Marital status: Widowed    Spouse name: Not on file  . Number of children: 2  . Years of education: Not on file  . Highest education level: Not on file  Occupational History  . Occupation: APL IT sales professional: [employer]    Comment: cotton mill  Tobacco Use  . Smoking status: Former Smoker    Packs/day: 3.50    Years: 20.00    Pack years: 70.00     Types: Cigarettes    Start date: 11/15/1970    Quit date: 07/31/1990    Years since quitting: 29.5  . Smokeless tobacco: Never Used  . Tobacco comment: quit 23 years ago  Vaping Use  . Vaping Use: Never used  Substance and Sexual Activity  . Alcohol use: No    Alcohol/week: 0.0 standard drinks  . Drug use: No  . Sexual activity: Never  Other Topics Concern  . Not on file  Social History Narrative   Regular exercise: no   Caffeine Use: 2-3 weekly   Social Determinants of Health   Financial Resource Strain: Low Risk   . Difficulty of Paying Living Expenses: Not hard at all  Food Insecurity: No Food Insecurity  . Worried About Charity fundraiser in the Last Year: Never true  . Ran Out of Food in the Last Year: Never true  Transportation Needs: No Transportation Needs  . Lack of Transportation (Medical): No  . Lack of Transportation (Non-Medical): No  Physical Activity:   . Days of Exercise per Week:   . Minutes of Exercise per Session:   Stress:   . Feeling of Stress :   Social Connections:   . Frequency of Communication with Friends and Family:   . Frequency of Social Gatherings with Friends and Family:   . Attends Religious Services:   . Active Member of Clubs or Organizations:   . Attends Archivist Meetings:   Marland Kitchen Marital Status:   Intimate Partner Violence:   . Fear of Current or Ex-Partner:   . Emotionally Abused:   Marland Kitchen Physically Abused:   . Sexually Abused:      Family History  Problem Relation Age of Onset  . Arthritis Mother   . Cancer Mother        ovarian  . Hypertension Mother   . Heart disease Mother        pacer  . Heart failure Mother   . Arthritis Father   . Cancer Father        lung  . Hypertension Father   . Cancer Sister        lung  . Cancer Brother        prostate  . Cancer Brother        lung  . Hypertension Son   . COPD Brother   . Heart disease Brother        died from CHF  . Hypertension Brother   . Cancer Brother         colon  . Alcohol abuse Brother   . Cirrhosis Brother   . Seizures Sister   . Stroke Sister   . Arthritis Brother      Review of Systems Dry cough Shortness of breath on activity No chest pain, sputum production, 3 pillow orthopnea Joint stiffness Nasal congestion Difficulty swallowing solids      Objective:   Physical Exam  Gen. Pleasant, well-nourished, in no distress, normal affect  ENT - no pallor,icterus, no post nasal drip Neck: No JVD, no thyromegaly, no carotid bruits Lungs: no use of accessory muscles, no dullness to percussion, RT base dry rales no rhonchi  Cardiovascular: Rhythm regular, heart sounds  normal, no murmurs or gallops, no peripheral edema Abdomen: soft and non-tender, no hepatosplenomegaly, BS normal. Musculoskeletal: No deformities, no cyanosis or clubbing Neuro:  alert, non focal       Assessment & Plan:

## 2020-02-16 ENCOUNTER — Telehealth: Payer: Self-pay | Admitting: Internal Medicine

## 2020-02-16 MED ORDER — SODIUM CHLORIDE 3 % IN NEBU: INHALATION_SOLUTION | Freq: Every day | RESPIRATORY_TRACT | 2 refills | Status: DC

## 2020-02-16 NOTE — Telephone Encounter (Signed)
Spoke with patient and she states that she would like RX for nebulizer medication and machine to be sent to [facility] because she can get it cheaper. Message sent to [facility] to send order for machine to [facility] and I have sent new RX for medication there as well. Nothing further needed at this time.

## 2020-02-16 NOTE — Telephone Encounter (Deleted)
lmom x1 

## 2020-02-20 ENCOUNTER — Telehealth: Payer: Medicare Other | Admitting: Family Medicine

## 2020-02-20 ENCOUNTER — Other Ambulatory Visit: Payer: Self-pay

## 2020-02-20 NOTE — Assessment & Plan Note (Deleted)
Supplement and monitor 

## 2020-02-20 NOTE — Progress Notes (Deleted)
Virtual Visit via Video Note  I connected with Rachael Johnson on 02/20/20 at 10:20 AM EDT by a video enabled telemedicine application and verified that I am speaking with the correct person using two identifiers.  Location: Patient: home, patient and provider are both in visit Provider: home   I discussed the limitations of evaluation and management by telemedicine and the availability of in person appointments. The patient expressed understanding and agreed to proceed. Rachael Johnson, CMA was able to get the patient setup on a video visit    Subjective:    Patient ID: Rachael Johnson, female    DOB: 07-Jul-1951, 69 y.o.   MRN: [id number]  No chief complaint on file.   HPI Patient is in today for follow up on chronic medical concerns. No recent febrile illness or hospitalizations.   Past Medical History:  Diagnosis Date  . Acute renal insufficiency 04/09/2017  . Anemia, iron deficiency 07/08/2012  . Arthritis   . Cataract 12/21/2014  . COPD (chronic obstructive pulmonary disease) ([place]) 01/23/2017   01/23/2017   Walked RA  2 laps @ 185 ft each stopped due to  Cough > > sob/ no desat nl pace  . Depression with anxiety 03/24/2011  . Diverticulitis   . Esophageal reflux 08/17/2013  . Essential thrombocythemia ([place]) 01/24/2011  . Frequent episodic tension-type headache   . Gastroenteritis 12/21/2014  . 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 GERD (gastroesophageal reflux disease)   . H. pylori infection 12/19/2012  . Hyperglycemia 12/17/2015  . Hypertension   . Iron deficiency anemia due to chronic blood loss 04/03/2017  . Osteoarthritis 05/24/2017  . Other and unspecified hyperlipidemia 02/25/2013  . Restless leg syndrome 09/30/2014  . Rhabdomyolysis 02/2017  . Scabies 03/28/2015  . 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 Seizure ([place])    childhood  . Shoulder wound, right, sequela 03/14/2017  . Thyroid disease     Past Surgical History:  Procedure Laterality Date  . ABDOMINAL HYSTERECTOMY      1992  . BREAST BIOPSY    . BREAST CYST ASPIRATION    . BREAST SURGERY  1992   biopsy, benign. Fibrocystic  . UMBILICAL HERNIA REPAIR N/A 09/25/2012   Procedure: HERNIA REPAIR UMBILICAL ADULT;  Surgeon: Rachael Bowie, MD;  Location: [facility];  Service: General;  Laterality: N/A;    Family History  Problem Relation Age of Onset  . Arthritis Mother   . Cancer Mother        ovarian  . Hypertension Mother   . Heart disease Mother        pacer  . Heart failure Mother   . Arthritis Father   . Cancer Father        lung  . Hypertension Father   . Cancer Sister        lung  . Cancer Brother        prostate  . Cancer Brother        lung  . Hypertension Son   . COPD Brother   . Heart disease Brother        died from CHF  . Hypertension Brother   . Cancer Brother        colon  . Alcohol abuse Brother   . Cirrhosis Brother   . Seizures Sister   . Stroke Sister   . Arthritis Brother     Social History   Socioeconomic History  . Marital status: Widowed    Spouse name: Not on file  .  Number of children: 2  . Years of education: Not on file  . Highest education level: Not on file  Occupational History  . Occupation: APL IT sales professional: [employer]    Comment: [employer]  Tobacco Use  . Smoking status: Former Smoker    Packs/day: 3.50    Years: 20.00    Pack years: 70.00    Types: Cigarettes    Start date: 11/15/1970    Quit date: 07/31/1990    Years since quitting: 29.5  . Smokeless tobacco: Never Used  . Tobacco comment: quit 23 years ago  Vaping Use  . Vaping Use: Never used  Substance and Sexual Activity  . Alcohol use: No    Alcohol/week: 0.0 standard drinks  . Drug use: No  . Sexual activity: Never  Other Topics Concern  . Not on file  Social History Narrative   Regular exercise: no   Caffeine Use: 2-3 weekly   Social Determinants of Health   Financial Resource Strain: Low Risk   . Difficulty of Paying Living Expenses: Not hard at all  Food  Insecurity: No Food Insecurity  . Worried About Charity fundraiser in the Last Year: Never true  . Ran Out of Food in the Last Year: Never true  Transportation Needs: No Transportation Needs  . Lack of Transportation (Medical): No  . Lack of Transportation (Non-Medical): No  Physical Activity:   . Days of Exercise per Week:   . Minutes of Exercise per Session:   Stress:   . Feeling of Stress :   Social Connections:   . Frequency of Communication with Friends and Family:   . Frequency of Social Gatherings with Friends and Family:   . Attends Religious Services:   . Active Member of Clubs or Organizations:   . Attends Archivist Meetings:   Marland Kitchen Marital Status:   Intimate Partner Violence:   . Fear of Current or Ex-Partner:   . Emotionally Abused:   Marland Kitchen Physically Abused:   . Sexually Abused:     Outpatient Medications Prior to Visit  Medication Sig Dispense Refill  . albuterol (VENTOLIN HFA) 108 (90 Base) MCG/ACT inhaler INHALE 2 PUFFS BY MOUTH EVERY 6 HOURS AS NEEDED FOR WHEEZING OR SHORTNESS OF BREATH 54 g 0  . ALLER-CHLOR 4 MG tablet TAKE 2 TABLETS BY MOUTH EVERY NIGHT AT BEDTIME AS NEEDED FOR ALLERGIES 30 tablet 1  . ALPRAZolam (XANAX) 1 MG tablet TAKE 1 TABLET BY MOUTH THREE TIMES DAILY AS NEEDED FOR ANXIETY 90 tablet 4  . atorvastatin (LIPITOR) 10 MG tablet Take 1 tablet (10 mg total) by mouth daily. 90 tablet 3  . budesonide-formoterol (SYMBICORT) 80-4.5 MCG/ACT inhaler Inhale 2 puffs into the lungs 2 (two) times daily. 30.6 g 1  . busPIRone (BUSPAR) 15 MG tablet TAKE (1) TABLET BY MOUTH FOUR TIMES A DAY AS NEEDED 120 tablet 4  . Cranberry 300 MG tablet Take 300 mg by mouth 2 (two) times daily.    . cyanocobalamin (,VITAMIN B-12,) 1000 MCG/ML injection Inject 1 ml into muscle q week x 4 then q monthly 10 mL 2  . cyclobenzaprine (FLEXERIL) 10 MG tablet Take 1 tablet (10 mg total) by mouth 2 (two) times daily as needed for muscle spasms. 60 tablet 3  . diphenhydrAMINE  (EQ ALLERGY RELIEF CHILDRENS) 12.5 MG/5ML liquid TAKE 5 ML BY MOUTH THREE TIMES DAILY AS NEEDED FOR MOUTH  PAIN 240 mL 1  . estradiol (ESTRACE) 0.5 MG tablet TAKE  ONE (1) TABLET BY MOUTH TWICE DAILY 60 tablet 1  . famotidine (PEPCID) 40 MG tablet Take 1 tablet (40 mg total) by mouth daily. 90 tablet 1  . fluconazole (DIFLUCAN) 150 MG tablet TAKE (1) TABLET BY MOUTH ONCE WEEKLY 2 tablet 10  . fluticasone (FLONASE) 50 MCG/ACT nasal spray INSTILL TWO (2) SPRAYS IN EACH NOSTRIL ONCE DAILY 16 g 2  . furosemide (LASIX) 20 MG tablet TAKE 1 TABLET BY MOUTH 3 TIMES DAILY AS NEEDED FOR EDEMA 90 tablet 10  . gabapentin (NEURONTIN) 100 MG capsule TAKE FOUR (4) CAPSULES BY MOUTH EVERY NIGHT AT BEDTIME 120 capsule 10  . gabapentin (NEURONTIN) 400 MG capsule Take 1 capsule (400 mg total) by mouth daily. 90 capsule 1  . granisetron (SANCUSO) 3.1 MG/24HR APPLY 1 PATCH TOPICALLY AS NEEDED FOR NAUSEA, REMOVE AFTER 7 DAYS 4 patch 4  . levothyroxine (SYNTHROID) 100 MCG tablet TAKE 1 TABLET BY MOUTH EVERY MORNING BEFORE BREAKFAST 30 tablet 1  . magic mouthwash SOLN Take 5 mLs by mouth 3 (three) times daily as needed for mouth pain. Components benadryl  525 mg, hydrocortisone 60 mg and nystatin 0.6 mg. 240 ml - Oral 240 mL 3  . meclizine (ANTIVERT) 12.5 MG tablet TAKE 1 TABLET BY MOUTH THREE TIMES DAILY AS NEEDED FOR DIZZINESS 40 tablet 10  . montelukast (SINGULAIR) 10 MG tablet TAKE 1 TABLET BY MOUTH AT BEDTIME 30 tablet 1  . Multiple Vitamin (MULTIVITAMIN PO) Take 1 tablet by mouth every morning.     . nystatin (MYCOSTATIN) 100000 UNIT/ML suspension Take 5 mLs (500,000 Units total) by mouth 4 (four) times daily. 60 mL 3  . ondansetron (ZOFRAN) 8 MG tablet TAKE 1/2-1 TABLET BY MOUTH EVERY 8 HOURS AS NEEDED FOR NAUSEA AND VOMITING 90 tablet 10  . oxyCODONE-acetaminophen (PERCOCET) 10-325 MG tablet Take 1 tablet by mouth every 6 (six) hours as needed for pain. 120 tablet 0  . PEGASYS 180 MCG/ML injection INJECT 0.25 MLS  (45 MCG TOTAL) INTO THE SKIN EVERY 7 (SEVEN) DAYS. SINGLE USE VIALS, AFTER INJECTION DISPOSE OF REMAINING VIAL CONTENT. 4 mL 6  . RESTASIS 0.05 % ophthalmic emulsion PLACE ONE DROP INTO EACH EYE EVERY 12 HOURS 360 each 3  . sodium chloride HYPERTONIC 3 % nebulizer solution Take by nebulization daily. 60 mL 2  . Spacer/Aero-Holding Chambers [facility] DIAMOND) MISC USE as directed. Use with inhaler 1 each 0  . SSD 1 % cream APPLY TOPICALLY DAILY 50 g 0  . traZODone (DESYREL) 100 MG tablet TAKE 1 TABLET BY MOUTH AT BEDTIME 30 tablet 10  . TUBERCULIN SYR 1CC/27GX1/2" (B-D TB SYRINGE 1CC/27GX1/2") 27G X 1/2" 1 ML MISC USE AS DIRECTED ONCE WEEKLY FOR PEG-INTERFERON INJECTIONS 4 each 11  . venlafaxine XR (EFFEXOR-XR) 75 MG 24 hr capsule TAKE 1 CAPSULE BY MOUTH THREE TIMES DAILY 90 capsule 10   No facility-administered medications prior to visit.    No Known Allergies  Review of Systems  Constitutional: Negative for fever and malaise/fatigue.  HENT: Negative for congestion.   Eyes: Negative for blurred vision.  Respiratory: Negative for shortness of breath.   Cardiovascular: Negative for chest pain, palpitations and leg swelling.  Gastrointestinal: Negative for abdominal pain, blood in stool and nausea.  Genitourinary: Negative for dysuria and frequency.  Musculoskeletal: Negative for falls.  Skin: Negative for rash.  Neurological: Negative for dizziness, loss of consciousness and headaches.  Endo/Heme/Allergies: Negative for environmental allergies.  Psychiatric/Behavioral: Negative for depression. The patient is not nervous/anxious.  Objective:    Physical Exam Constitutional:      Appearance: Normal appearance. She is not ill-appearing.  HENT:     Head: Normocephalic and atraumatic.     Right Ear: External ear normal.  Eyes:     General:        Right eye: No discharge.        Left eye: No discharge.  Pulmonary:     Effort: Pulmonary effort is normal.  Neurological:      Mental Status: She is alert and oriented to person, place, and time.  Psychiatric:        Behavior: Behavior normal.     LMP 07/31/1990  Wt Readings from Last 3 Encounters:  02/13/20 146 lb (66.2 kg)  02/05/20 144 lb (65.3 kg)  01/30/20 144 lb 3.2 oz (65.4 kg)    Diabetic Foot Exam - Simple   No data filed     Lab Results  Component Value Date   WBC 4.3 01/30/2020   HGB 11.7 (L) 01/30/2020   HCT 36.9 01/30/2020   PLT 545 (H) 01/30/2020   GLUCOSE 105 (H) 01/30/2020   CHOL 236 (H) 05/06/2018   TRIG 322.0 (H) 05/06/2018   HDL 59.00 05/06/2018   LDLDIRECT 95.0 05/06/2018   LDLCALC 82 02/12/2017   ALT 36 01/30/2020   AST 45 (H) 01/30/2020   NA 139 01/30/2020   K 3.8 01/30/2020   CL 102 01/30/2020   CREATININE 0.94 01/30/2020   BUN 12 01/30/2020   CO2 31 01/30/2020   TSH 0.12 (L) 10/09/2019   INR 0.94 09/16/2018   HGBA1C 5.0 08/10/2017    Lab Results  Component Value Date   TSH 0.12 (L) 10/09/2019   Lab Results  Component Value Date   WBC 4.3 01/30/2020   HGB 11.7 (L) 01/30/2020   HCT 36.9 01/30/2020   MCV 104.8 (H) 01/30/2020   PLT 545 (H) 01/30/2020   Lab Results  Component Value Date   NA 139 01/30/2020   K 3.8 01/30/2020   CHLORIDE 105 11/29/2016   CO2 31 01/30/2020   GLUCOSE 105 (H) 01/30/2020   BUN 12 01/30/2020   CREATININE 0.94 01/30/2020   BILITOT 0.4 01/30/2020   ALKPHOS 124 01/30/2020   AST 45 (H) 01/30/2020   ALT 36 01/30/2020   PROT 8.0 01/30/2020   ALBUMIN 4.2 01/30/2020   CALCIUM 9.2 01/30/2020   ANIONGAP 6 01/30/2020   EGFR 80 (L) 11/29/2016   GFR 62.31 10/29/2019   Lab Results  Component Value Date   CHOL 236 (H) 05/06/2018   Lab Results  Component Value Date   HDL 59.00 05/06/2018   Lab Results  Component Value Date   LDLCALC 82 02/12/2017   Lab Results  Component Value Date   TRIG 322.0 (H) 05/06/2018   Lab Results  Component Value Date   CHOLHDL 4 05/06/2018   Lab Results  Component Value Date   HGBA1C 5.0  08/10/2017       Assessment & Plan:   Problem List Items Addressed This Visit    Hypothyroid    On Levothyroxine, continue to monitor      Hyperglycemia    hgba1c acceptable, minimize simple carbs. Increase exercise as tolerated.       Vitamin D deficiency    Supplement and monitor      Essential hypertension    Monitor and report any concerns, no changes to meds. Encouraged heart healthy diet such as the DASH diet and exercise as tolerated.  Hypomagnesemia    Supplement and monitor         I am having Malie D. Hosek maintain her Multiple Vitamin (MULTIVITAMIN PO), Cranberry, SSD, magic mouthwash, optichamber diamond, atorvastatin, famotidine, gabapentin, albuterol, Restasis, budesonide-formoterol, nystatin, ondansetron, fluticasone, TUBERCULIN SYR 1CC/27GX1/2", furosemide, venlafaxine XR, gabapentin, meclizine, traZODone, Sancuso, diphenhydrAMINE, cyanocobalamin, Pegasys, montelukast, busPIRone, fluconazole, levothyroxine, estradiol, oxyCODONE-acetaminophen, cyclobenzaprine, ALPRAZolam, Aller-Chlor, and sodium chloride HYPERTONIC.  No orders of the defined types were placed in this encounter.   I discussed the assessment and treatment plan with the patient. The patient was provided an opportunity to ask questions and all were answered. The patient agreed with the plan and demonstrated an understanding of the instructions.   The patient was advised to call back or seek an in-person evaluation if the symptoms worsen or if the condition fails to improve as anticipated.  I provided 20 minutes of non-face-to-face time during this encounter.   Penni Homans, MD

## 2020-02-20 NOTE — Assessment & Plan Note (Deleted)
hgba1c acceptable, minimize simple carbs. Increase exercise as tolerated.  

## 2020-02-20 NOTE — Assessment & Plan Note (Deleted)
On Levothyroxine, continue to monitor 

## 2020-02-20 NOTE — Assessment & Plan Note (Deleted)
Monitor and report any concerns, no changes to meds. Encouraged heart healthy diet such as the DASH diet and exercise as tolerated.  ?

## 2020-02-23 ENCOUNTER — Ambulatory Visit (HOSPITAL_BASED_OUTPATIENT_CLINIC_OR_DEPARTMENT_OTHER): Payer: Medicare Other

## 2020-02-23 DIAGNOSIS — H04123 Dry eye syndrome of bilateral lacrimal glands: Secondary | ICD-10-CM | POA: Diagnosis not present

## 2020-02-23 DIAGNOSIS — Z961 Presence of intraocular lens: Secondary | ICD-10-CM | POA: Diagnosis not present

## 2020-02-24 ENCOUNTER — Ambulatory Visit (HOSPITAL_COMMUNITY): Payer: Medicare Other

## 2020-02-25 DIAGNOSIS — J189 Pneumonia, unspecified organism: Secondary | ICD-10-CM | POA: Diagnosis not present

## 2020-02-25 DIAGNOSIS — J9692 Respiratory failure, unspecified with hypercapnia: Secondary | ICD-10-CM | POA: Diagnosis not present

## 2020-02-25 DIAGNOSIS — R6521 Severe sepsis with septic shock: Secondary | ICD-10-CM | POA: Diagnosis not present

## 2020-02-25 DIAGNOSIS — R52 Pain, unspecified: Secondary | ICD-10-CM | POA: Diagnosis not present

## 2020-02-25 DIAGNOSIS — E876 Hypokalemia: Secondary | ICD-10-CM | POA: Diagnosis not present

## 2020-02-25 DIAGNOSIS — J9691 Respiratory failure, unspecified with hypoxia: Secondary | ICD-10-CM | POA: Diagnosis not present

## 2020-02-25 DIAGNOSIS — R509 Fever, unspecified: Secondary | ICD-10-CM | POA: Diagnosis not present

## 2020-02-25 DIAGNOSIS — J470 Bronchiectasis with acute lower respiratory infection: Secondary | ICD-10-CM | POA: Diagnosis not present

## 2020-02-25 DIAGNOSIS — A419 Sepsis, unspecified organism: Secondary | ICD-10-CM | POA: Diagnosis not present

## 2020-02-26 ENCOUNTER — Telehealth: Payer: Self-pay

## 2020-02-26 ENCOUNTER — Telehealth: Payer: Self-pay | Admitting: *Deleted

## 2020-02-26 DIAGNOSIS — R509 Fever, unspecified: Secondary | ICD-10-CM | POA: Diagnosis not present

## 2020-02-26 DIAGNOSIS — J90 Pleural effusion, not elsewhere classified: Secondary | ICD-10-CM | POA: Diagnosis present

## 2020-02-26 DIAGNOSIS — D510 Vitamin B12 deficiency anemia due to intrinsic factor deficiency: Secondary | ICD-10-CM | POA: Diagnosis present

## 2020-02-26 DIAGNOSIS — Z20822 Contact with and (suspected) exposure to covid-19: Secondary | ICD-10-CM | POA: Diagnosis present

## 2020-02-26 DIAGNOSIS — D539 Nutritional anemia, unspecified: Secondary | ICD-10-CM | POA: Diagnosis not present

## 2020-02-26 DIAGNOSIS — G894 Chronic pain syndrome: Secondary | ICD-10-CM | POA: Diagnosis present

## 2020-02-26 DIAGNOSIS — J9811 Atelectasis: Secondary | ICD-10-CM | POA: Diagnosis not present

## 2020-02-26 DIAGNOSIS — R000 Tachycardia, unspecified: Secondary | ICD-10-CM | POA: Diagnosis not present

## 2020-02-26 DIAGNOSIS — J168 Pneumonia due to other specified infectious organisms: Secondary | ICD-10-CM | POA: Diagnosis not present

## 2020-02-26 DIAGNOSIS — E878 Other disorders of electrolyte and fluid balance, not elsewhere classified: Secondary | ICD-10-CM | POA: Diagnosis not present

## 2020-02-26 DIAGNOSIS — R0902 Hypoxemia: Secondary | ICD-10-CM | POA: Diagnosis not present

## 2020-02-26 DIAGNOSIS — J189 Pneumonia, unspecified organism: Secondary | ICD-10-CM | POA: Diagnosis present

## 2020-02-26 DIAGNOSIS — H9190 Unspecified hearing loss, unspecified ear: Secondary | ICD-10-CM | POA: Diagnosis present

## 2020-02-26 DIAGNOSIS — J9692 Respiratory failure, unspecified with hypercapnia: Secondary | ICD-10-CM | POA: Diagnosis present

## 2020-02-26 DIAGNOSIS — Z87891 Personal history of nicotine dependence: Secondary | ICD-10-CM | POA: Diagnosis not present

## 2020-02-26 DIAGNOSIS — R091 Pleurisy: Secondary | ICD-10-CM | POA: Diagnosis not present

## 2020-02-26 DIAGNOSIS — J181 Lobar pneumonia, unspecified organism: Secondary | ICD-10-CM | POA: Diagnosis not present

## 2020-02-26 DIAGNOSIS — R7989 Other specified abnormal findings of blood chemistry: Secondary | ICD-10-CM | POA: Diagnosis not present

## 2020-02-26 DIAGNOSIS — E039 Hypothyroidism, unspecified: Secondary | ICD-10-CM | POA: Diagnosis present

## 2020-02-26 DIAGNOSIS — C946 Myelodysplastic disease, not classified: Secondary | ICD-10-CM | POA: Diagnosis present

## 2020-02-26 DIAGNOSIS — Z7951 Long term (current) use of inhaled steroids: Secondary | ICD-10-CM | POA: Diagnosis not present

## 2020-02-26 DIAGNOSIS — R2243 Localized swelling, mass and lump, lower limb, bilateral: Secondary | ICD-10-CM | POA: Diagnosis not present

## 2020-02-26 DIAGNOSIS — R6521 Severe sepsis with septic shock: Secondary | ICD-10-CM | POA: Diagnosis present

## 2020-02-26 DIAGNOSIS — I340 Nonrheumatic mitral (valve) insufficiency: Secondary | ICD-10-CM | POA: Diagnosis not present

## 2020-02-26 DIAGNOSIS — A419 Sepsis, unspecified organism: Secondary | ICD-10-CM | POA: Diagnosis present

## 2020-02-26 DIAGNOSIS — F05 Delirium due to known physiological condition: Secondary | ICD-10-CM | POA: Diagnosis not present

## 2020-02-26 DIAGNOSIS — J9691 Respiratory failure, unspecified with hypoxia: Secondary | ICD-10-CM | POA: Diagnosis present

## 2020-02-26 DIAGNOSIS — J470 Bronchiectasis with acute lower respiratory infection: Secondary | ICD-10-CM | POA: Diagnosis present

## 2020-02-26 DIAGNOSIS — I9589 Other hypotension: Secondary | ICD-10-CM | POA: Diagnosis present

## 2020-02-26 DIAGNOSIS — R918 Other nonspecific abnormal finding of lung field: Secondary | ICD-10-CM | POA: Diagnosis not present

## 2020-02-26 DIAGNOSIS — M6281 Muscle weakness (generalized): Secondary | ICD-10-CM | POA: Diagnosis present

## 2020-02-26 DIAGNOSIS — R0789 Other chest pain: Secondary | ICD-10-CM | POA: Diagnosis present

## 2020-02-26 DIAGNOSIS — E876 Hypokalemia: Secondary | ICD-10-CM | POA: Diagnosis present

## 2020-02-26 DIAGNOSIS — I509 Heart failure, unspecified: Secondary | ICD-10-CM | POA: Diagnosis not present

## 2020-02-26 DIAGNOSIS — R0602 Shortness of breath: Secondary | ICD-10-CM | POA: Diagnosis not present

## 2020-02-26 DIAGNOSIS — R410 Disorientation, unspecified: Secondary | ICD-10-CM | POA: Diagnosis not present

## 2020-02-26 DIAGNOSIS — J449 Chronic obstructive pulmonary disease, unspecified: Secondary | ICD-10-CM | POA: Diagnosis not present

## 2020-02-26 DIAGNOSIS — R790 Abnormal level of blood mineral: Secondary | ICD-10-CM | POA: Diagnosis not present

## 2020-02-26 DIAGNOSIS — Z79891 Long term (current) use of opiate analgesic: Secondary | ICD-10-CM | POA: Diagnosis not present

## 2020-02-26 DIAGNOSIS — J439 Emphysema, unspecified: Secondary | ICD-10-CM | POA: Diagnosis present

## 2020-02-26 DIAGNOSIS — F0390 Unspecified dementia without behavioral disturbance: Secondary | ICD-10-CM | POA: Diagnosis present

## 2020-02-26 NOTE — Telephone Encounter (Signed)
Call received from patient's daughter, Hope to inform Dr. Marin Olp that patient has been admitted to [facility] with pneumonia. Dr. Marin Olp notified.

## 2020-02-26 NOTE — Telephone Encounter (Signed)
Nurse Assessment Nurse: Martyn Ehrich RN, Felicia Date/Time (Eastern Time): 02/25/2020 3:52:32 PM Confirm and document reason for call. If symptomatic, describe symptoms. ---Dtr Lang[facility]ne is staying with her bc of her stability issues. Her MD knows about that. She complains of her R side worsening weakness the last 2d. She cant pick up R leg. She has had a fever since yesterday. Now it is 98.6 on her forehead. No cough. She had sharp pain in shoulder blade Sun night and was vomiting at that time. She is still having pain - earlier she had pain at chest from front to back - 3 very concerning scenarios - skipping nurse assessment for urgency - skipped 1st question in neuro guide to get to yes answer Has the patient had close contact with a person known or suspected to have the novel coronavirus illness OR traveled / lives in area with major community spread (including international travel) in the last 14 days from the onset of symptoms? * If Asymptomatic, screen for exposure and travel within the last 14 days. ---No Does the patient have any new or worsening symptoms? ---Yes Will a triage be completed? ---Yes Related visit to physician within the last 2 weeks? ---N/A Does the PT have any chronic conditions? (i.e. diabetes, asthma, this includes High risk factors for pregnancy, etc.) ---Unknown Is this a behavioral health or substance abuse call? ---No Guidelines Guideline Title Affirmed Question Affirmed Notes Nurse Date/Time (Eastern Time) Neurologic Deficit [1] Weakness (i.e., paralysis, loss of muscle strength) of the face, Martyn Ehrich, RN, Felicia 3/70/4888 9:16:94 PM PLEASE NOTE: All timestamps contained within this report are represented as [place] Standard Time. CONFIDENTIALTY NOTICE: This fax transmission is intended only for the addressee. It contains information that is legally privileged, confidential or otherwise protected from use or disclosure. If you are not the intended recipient, you  are strictly prohibited from reviewing, disclosing, copying using or disseminating any of this information or taking any action in reliance on or regarding this information. If you have received this fax in error, please notify us immediately by telephone so that we can arrange for its return to [place]. Phone: [phone], Toll-Free: [phone], Fax: [phone] Page: 2 of 2 Call Id: [id number] Guidelines Guideline Title Affirmed Question Affirmed Notes Nurse Date/Time Eilene Ghazi Time) arm / hand, or leg / foot on one side of the body AND [2] sudden onset AND [3] present now (Exception: Bell's palsy suspected [i.e., weakness only one side of the face, developing over hours to days, no other symptoms]) Disp. Time Eilene Ghazi Time) Disposition Final User 02/25/2020 4:02:43 PM Send To RN Personal Martyn Ehrich, RN, Felicia 7/86/7544 9:20:10 PM 911 Outcome Documentation Martyn Ehrich, RN, Solmon Ice Reason: No answer 02/25/2020 4:01:10 PM Call EMS 911 Now Yes Gaddy, RN, Solmon Ice Caller Disagree/Comply Comply Caller Understands Yes PreDisposition Did not know what to do Care Advice Given Per Guideline CALL EMS 911 NOW: * Immediate medical attention is needed. You need to hang up and call 911 (or an ambulance). * Triager Discretion: I'll call you back in a few minutes to be sure you were able to reach them.  Pt admitted to [facility].

## 2020-02-27 ENCOUNTER — Telehealth: Payer: Self-pay | Admitting: Family Medicine

## 2020-02-27 ENCOUNTER — Other Ambulatory Visit: Payer: Self-pay | Admitting: Family Medicine

## 2020-02-27 DIAGNOSIS — G8929 Other chronic pain: Secondary | ICD-10-CM

## 2020-02-27 MED ORDER — OXYCODONE-ACETAMINOPHEN 10-325 MG PO TABS: 1.0000 | ORAL_TABLET | Freq: Four times a day (QID) | ORAL | 0 refills | Status: DC | PRN

## 2020-02-27 NOTE — Telephone Encounter (Signed)
Left message on machine that rx has been sent in. 

## 2020-02-27 NOTE — Telephone Encounter (Signed)
done

## 2020-02-27 NOTE — Telephone Encounter (Signed)
Last written: 01/30/20 Last ov: 02/05/20 Next ov: 03/05/20 Contract:  UDS: 10/09/19

## 2020-02-27 NOTE — Telephone Encounter (Signed)
Medication: oxyCODONE-acetaminophen (PERCOCET) 10-325 MG tablet [[id number]      Has the patient contacted their pharmacy?  (If no, request that the patient contact the pharmacy for the refill.) (If yes, when and what did the pharmacy advise?)     Preferred Pharmacy (with phone number or street name): [place], [place] - [address] [place], [place] [address]  Phone:  [phone] Fax:  [phone]      Agent: Please be advised that RX refills may take up to 3 business days. We ask that you follow-up with your pharmacy.

## 2020-03-03 ENCOUNTER — Inpatient Hospital Stay: Payer: Medicare Other | Admitting: Hematology & Oncology

## 2020-03-03 ENCOUNTER — Ambulatory Visit (HOSPITAL_BASED_OUTPATIENT_CLINIC_OR_DEPARTMENT_OTHER): Admission: RE | Admit: 2020-03-03 | Payer: Medicare Other | Source: Ambulatory Visit

## 2020-03-03 ENCOUNTER — Inpatient Hospital Stay: Payer: Medicare Other

## 2020-03-03 DIAGNOSIS — J90 Pleural effusion, not elsewhere classified: Secondary | ICD-10-CM | POA: Diagnosis not present

## 2020-03-03 DIAGNOSIS — R918 Other nonspecific abnormal finding of lung field: Secondary | ICD-10-CM | POA: Diagnosis not present

## 2020-03-04 ENCOUNTER — Telehealth: Payer: Self-pay | Admitting: Pulmonary Disease

## 2020-03-04 ENCOUNTER — Ambulatory Visit (HOSPITAL_COMMUNITY): Admission: RE | Admit: 2020-03-04 | Payer: Medicare Other | Source: Ambulatory Visit

## 2020-03-04 ENCOUNTER — Telehealth: Payer: Self-pay | Admitting: Family Medicine

## 2020-03-04 ENCOUNTER — Inpatient Hospital Stay
Admission: AD | Admit: 2020-03-04 | Payer: Medicare Other | Source: Other Acute Inpatient Hospital | Admitting: Internal Medicine

## 2020-03-04 NOTE — Telephone Encounter (Signed)
ATC daughter. Unable to leave vm related to vm being full. Will attempt to call later.

## 2020-03-04 NOTE — Telephone Encounter (Signed)
Sorry to hear. We can arrange for office visit soon after discharge or if she indeed get does get transferred to [facility], hospitalist service can consult [place] as needed

## 2020-03-04 NOTE — Telephone Encounter (Signed)
Called patient's daughter and unable to leave a message. Patient is in the hospital, see message below.    Pt's daughter called and patient is in the hospital at [facility]. Had CT done there and drew fluid off lungs. Trying to transfer to [facility]. Wanted to let Dr. Elsworth Soho know. Please advise.

## 2020-03-04 NOTE — Telephone Encounter (Signed)
Caller: [name] (son) Call back # [phone]  [name] is calling stating mom is hospitalized at [facility] he would like his mom to be transferred to [facility]. He was told [facility] does not have any available bed and for him to call his PCP to expedite transfer process.

## 2020-03-05 ENCOUNTER — Telehealth: Payer: Medicare Other | Admitting: Family Medicine

## 2020-03-05 NOTE — Telephone Encounter (Signed)
We would be unable to help with this is that correct?

## 2020-03-06 ENCOUNTER — Inpatient Hospital Stay: Admit: 2020-03-06 | Payer: Medicare Other | Admitting: Specialist

## 2020-03-06 NOTE — Telephone Encounter (Signed)
I have never helped with this before. Has anyone else? Is she still in the hospital?

## 2020-03-07 NOTE — Telephone Encounter (Signed)
Unable to help with transfer since I am an outpatient doctor

## 2020-03-07 NOTE — Telephone Encounter (Signed)
Because we do not do inpt there is really nothing we can do ---- it is supposed to be handled with inpt docs---- at least that is how I understand it  The hospitalists would handle it I do believe

## 2020-03-08 NOTE — Telephone Encounter (Signed)
Son notified of Dr. Rhae Lerner note.  He states that patient is on a waiting list for [facility].

## 2020-03-09 ENCOUNTER — Ambulatory Visit (HOSPITAL_COMMUNITY): Payer: Medicare Other

## 2020-03-11 DIAGNOSIS — Z87891 Personal history of nicotine dependence: Secondary | ICD-10-CM | POA: Diagnosis not present

## 2020-03-11 DIAGNOSIS — Z79891 Long term (current) use of opiate analgesic: Secondary | ICD-10-CM | POA: Diagnosis not present

## 2020-03-11 DIAGNOSIS — E876 Hypokalemia: Secondary | ICD-10-CM | POA: Diagnosis not present

## 2020-03-11 DIAGNOSIS — Z7951 Long term (current) use of inhaled steroids: Secondary | ICD-10-CM | POA: Diagnosis not present

## 2020-03-11 DIAGNOSIS — J439 Emphysema, unspecified: Secondary | ICD-10-CM | POA: Diagnosis not present

## 2020-03-11 DIAGNOSIS — I959 Hypotension, unspecified: Secondary | ICD-10-CM | POA: Diagnosis not present

## 2020-03-11 DIAGNOSIS — A419 Sepsis, unspecified organism: Secondary | ICD-10-CM | POA: Diagnosis not present

## 2020-03-11 DIAGNOSIS — R0902 Hypoxemia: Secondary | ICD-10-CM | POA: Diagnosis not present

## 2020-03-11 DIAGNOSIS — E039 Hypothyroidism, unspecified: Secondary | ICD-10-CM | POA: Diagnosis not present

## 2020-03-11 DIAGNOSIS — J189 Pneumonia, unspecified organism: Secondary | ICD-10-CM | POA: Diagnosis not present

## 2020-03-11 DIAGNOSIS — C946 Myelodysplastic disease, not classified: Secondary | ICD-10-CM | POA: Diagnosis not present

## 2020-03-11 DIAGNOSIS — Z9981 Dependence on supplemental oxygen: Secondary | ICD-10-CM | POA: Diagnosis not present

## 2020-03-11 DIAGNOSIS — D510 Vitamin B12 deficiency anemia due to intrinsic factor deficiency: Secondary | ICD-10-CM | POA: Diagnosis not present

## 2020-03-12 ENCOUNTER — Other Ambulatory Visit: Payer: Self-pay | Admitting: Hematology & Oncology

## 2020-03-12 DIAGNOSIS — D473 Essential (hemorrhagic) thrombocythemia: Secondary | ICD-10-CM

## 2020-03-12 MED ORDER — PEGASYS 180 MCG/ML ~~LOC~~ SOLN: 135.0000 ug | SUBCUTANEOUS | 6 refills | Status: DC

## 2020-03-15 ENCOUNTER — Telehealth: Payer: Self-pay | Admitting: Family Medicine

## 2020-03-15 ENCOUNTER — Telehealth: Payer: Self-pay

## 2020-03-15 ENCOUNTER — Inpatient Hospital Stay: Payer: Medicare Other | Admitting: Family Medicine

## 2020-03-15 DIAGNOSIS — E876 Hypokalemia: Secondary | ICD-10-CM | POA: Diagnosis not present

## 2020-03-15 DIAGNOSIS — R0902 Hypoxemia: Secondary | ICD-10-CM | POA: Diagnosis not present

## 2020-03-15 DIAGNOSIS — J439 Emphysema, unspecified: Secondary | ICD-10-CM | POA: Diagnosis not present

## 2020-03-15 DIAGNOSIS — A419 Sepsis, unspecified organism: Secondary | ICD-10-CM | POA: Diagnosis not present

## 2020-03-15 DIAGNOSIS — I959 Hypotension, unspecified: Secondary | ICD-10-CM | POA: Diagnosis not present

## 2020-03-15 DIAGNOSIS — J189 Pneumonia, unspecified organism: Secondary | ICD-10-CM | POA: Diagnosis not present

## 2020-03-15 NOTE — Telephone Encounter (Signed)
Called HHRN informef ok per PCP for order.

## 2020-03-15 NOTE — Telephone Encounter (Signed)
Per Otila Kluver w/ [facility]  Call Back # [phone]     Pt recently d/c'd from hospital , Otila Kluver is requesting a verbal order for OT and  Nursing for patient.  Please Advise

## 2020-03-15 NOTE — Telephone Encounter (Signed)
I agree she needs to go to the emergency room

## 2020-03-15 NOTE — Telephone Encounter (Signed)
LM requesting call back.     [facility] Night - Client TELEPHONE ADVICE RECORD AccessNurse Patient Name: Rachael Johnson Gender: Female DOB: 09/28/1950 Age: 69 Y 49 M 12 D Return Phone Number: [phone] (Primary) [place] [address] Client [facility] Night - Client Client Site  [facility] - Night Physician Penni Homans - MD Contact Type Call Who Is Calling Patient / Member / Family / Caregiver Call Type Triage / Clinical Caller Name [facility] Relationship To Patient Daughter Return Phone Number [phone] (Primary) Chief Complaint Leg Swelling And Edema Reason for Call Symptomatic / Request for Rachael Johnson states she has been staying with her mother and she just got out of hospital. 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 She is tired and worn out. She states her left leg is swollen very bad. She states all she wants to do is sleep. She states she can not go to to her appointment tomorrow because she does not have any strength. Translation No Nurse Assessment Nurse: Noberto Retort, RN, Malvin Johns Date/Time Eilene Ghazi Time): 03/14/2020 1:31:48 PM Confirm and document reason for call. If symptomatic, describe symptoms. ---Caller states she has been staying with her mother and she just got out of hospital. 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 She is tired and worn out. She states her left leg is swollen very bad. She states all she wants to do is sleep. She states she can not go to to her appointment tomorrow because she does not have any strength. Right arm is mildly bruised due to IVs being placed while in the hospital. No fever. No other s/s at this time. Has the patient had close contact with a person known or suspected to have the novel coronavirus illness OR traveled / lives in area with major community spread (including international travel) in the last 14 days from  the onset of symptoms? * If Asymptomatic, screen for exposure and travel within the last 14 days. ---No Does the patient have any new or worsening symptoms? ---Yes Will a triage be completed? ---Yes Related visit to physician within the last 2 weeks? ---Yes Does the PT have any chronic conditions? (i.e. diabetes, asthma, this includes High risk factors for pregnancy, etc.) ---No Is this a behavioral health or substance abuse call? ---No PLEASE NOTE: All timestamps contained within this report are represented as [place] Standard Time. CONFIDENTIALTY NOTICE: This fax transmission is intended only for the addressee. It contains information that is legally privileged, confidential or otherwise protected from use or disclosure. If you are not the intended recipient, you are strictly prohibited from reviewing, disclosing, copying using or disseminating any of this information or taking any action in reliance on or regarding this information. If you have received this fax in error, please notify us immediately by telephone so that we can arrange for its return to [place]. Phone: [phone], Toll-Free: [phone], Fax: [phone] Page: 2 of 2 Call Id: [id number] Guidelines Guideline Title Affirmed Question Affirmed Notes Nurse Date/Time Eilene Ghazi Time) Leg Swelling and Edema [1] Can't walk or can barely walk AND [2] new-onset Rachael Johnson 03/14/2020 1:34:42 PM Disp. Time Eilene Ghazi Time) Disposition Final User 03/14/2020 1:36:58 PM Go to ED Now Yes Noberto Retort, RN, Renne Musca Disagree/Comply Comply Caller Understands Yes PreDisposition InappropriateToAsk Care Advice Given Per Guideline GO TO ED NOW: * You need to be seen in the Emergency Department. * Go to the ED at ___________ [place]  now. Drive carefully. BRING MEDICINES: * Please bring a list of your current medicines when you go to the Emergency Department (ER). CARE ADVICE given per Leg Swelling and Edema (Adult)  guideline. Referrals [facility] - ED

## 2020-03-16 NOTE — Telephone Encounter (Signed)
Spoke with patients daughter, [place].  Daughter states patient is much better today. She is less lethargic/sleepy.  HH RN and PT has made home visit. Leg edema has improved.  Advised to call with any questions/concerns. Has F/U with [facility] this week.

## 2020-03-18 ENCOUNTER — Inpatient Hospital Stay (HOSPITAL_BASED_OUTPATIENT_CLINIC_OR_DEPARTMENT_OTHER): Payer: Medicare Other | Admitting: Hematology & Oncology

## 2020-03-18 ENCOUNTER — Other Ambulatory Visit: Payer: Self-pay | Admitting: *Deleted

## 2020-03-18 ENCOUNTER — Other Ambulatory Visit: Payer: Self-pay

## 2020-03-18 ENCOUNTER — Encounter: Payer: Self-pay | Admitting: Hematology & Oncology

## 2020-03-18 ENCOUNTER — Ambulatory Visit (HOSPITAL_BASED_OUTPATIENT_CLINIC_OR_DEPARTMENT_OTHER)
Admission: RE | Admit: 2020-03-18 | Discharge: 2020-03-18 | Disposition: A | Discharge status: Home / Self Care | Payer: Medicare Other | Source: Ambulatory Visit | Attending: Hematology & Oncology | Admitting: Hematology & Oncology

## 2020-03-18 ENCOUNTER — Encounter: Payer: Self-pay | Admitting: *Deleted

## 2020-03-18 ENCOUNTER — Inpatient Hospital Stay: Payer: Medicare Other | Attending: Hematology & Oncology

## 2020-03-18 VITALS — BP 117/60 | HR 83 | Temp 99.0°F | Resp 18

## 2020-03-18 DIAGNOSIS — D510 Vitamin B12 deficiency anemia due to intrinsic factor deficiency: Secondary | ICD-10-CM | POA: Insufficient documentation

## 2020-03-18 DIAGNOSIS — Z7901 Long term (current) use of anticoagulants: Secondary | ICD-10-CM | POA: Diagnosis not present

## 2020-03-18 DIAGNOSIS — D509 Iron deficiency anemia, unspecified: Secondary | ICD-10-CM | POA: Insufficient documentation

## 2020-03-18 DIAGNOSIS — D473 Essential (hemorrhagic) thrombocythemia: Secondary | ICD-10-CM

## 2020-03-18 DIAGNOSIS — R531 Weakness: Secondary | ICD-10-CM | POA: Diagnosis not present

## 2020-03-18 DIAGNOSIS — M79606 Pain in leg, unspecified: Secondary | ICD-10-CM | POA: Insufficient documentation

## 2020-03-18 DIAGNOSIS — D500 Iron deficiency anemia secondary to blood loss (chronic): Secondary | ICD-10-CM

## 2020-03-18 DIAGNOSIS — M7989 Other specified soft tissue disorders: Secondary | ICD-10-CM | POA: Insufficient documentation

## 2020-03-18 DIAGNOSIS — Z79899 Other long term (current) drug therapy: Secondary | ICD-10-CM | POA: Insufficient documentation

## 2020-03-18 DIAGNOSIS — Z7982 Long term (current) use of aspirin: Secondary | ICD-10-CM | POA: Diagnosis not present

## 2020-03-18 DIAGNOSIS — D7581 Myelofibrosis: Secondary | ICD-10-CM | POA: Insufficient documentation

## 2020-03-18 DIAGNOSIS — Z8603 Personal history of neoplasm of uncertain behavior: Secondary | ICD-10-CM | POA: Diagnosis not present

## 2020-03-18 DIAGNOSIS — D75839 Thrombocytosis, unspecified: Secondary | ICD-10-CM

## 2020-03-18 DIAGNOSIS — R410 Disorientation, unspecified: Secondary | ICD-10-CM | POA: Diagnosis not present

## 2020-03-18 DIAGNOSIS — D539 Nutritional anemia, unspecified: Secondary | ICD-10-CM

## 2020-03-18 DIAGNOSIS — I82812 Embolism and thrombosis of superficial veins of left lower extremities: Secondary | ICD-10-CM | POA: Diagnosis not present

## 2020-03-18 LAB — CMP (CANCER CENTER ONLY)
ALT: 25 U/L (ref 0–44)
AST: 61 U/L — ABNORMAL HIGH (ref 15–41)
Albumin: 3.2 g/dL — ABNORMAL LOW (ref 3.5–5.0)
Alkaline Phosphatase: 194 U/L — ABNORMAL HIGH (ref 38–126)
Anion gap: 5 (ref 5–15)
BUN: 16 mg/dL (ref 8–23)
CO2: 45 mmol/L — ABNORMAL HIGH (ref 22–32)
Calcium: 9 mg/dL (ref 8.9–10.3)
Chloride: 93 mmol/L — ABNORMAL LOW (ref 98–111)
Creatinine: 0.82 mg/dL (ref 0.44–1.00)
GFR, Est AFR Am: >60 mL/min (ref >60)
GFR, Estimated: >60 mL/min (ref >60)
Glucose, Bld: 106 mg/dL — ABNORMAL HIGH (ref 70–99)
Potassium: 3.2 mmol/L — ABNORMAL LOW (ref 3.5–5.1)
Sodium: 143 mmol/L (ref 135–145)
Total Bilirubin: 0.6 mg/dL (ref 0.3–1.2)
Total Protein: 7.5 g/dL (ref 6.5–8.1)

## 2020-03-18 LAB — CBC WITH DIFFERENTIAL (CANCER CENTER ONLY)
Abs Immature Granulocytes: 0.18 10*3/uL — ABNORMAL HIGH (ref 0.00–0.07)
Basophils Absolute: 0.1 10*3/uL (ref 0.0–0.1)
Basophils Relative: 1 %
Eosinophils Absolute: 0.7 10*3/uL — ABNORMAL HIGH (ref 0.0–0.5)
Eosinophils Relative: 8 %
HCT: 33.2 % — ABNORMAL LOW (ref 36.0–46.0)
Hemoglobin: 10.1 g/dL — ABNORMAL LOW (ref 12.0–15.0)
Immature Granulocytes: 2 %
Lymphocytes Relative: 14 %
Lymphs Abs: 1.3 10*3/uL (ref 0.7–4.0)
MCH: 32.4 pg (ref 26.0–34.0)
MCHC: 30.4 g/dL (ref 30.0–36.0)
MCV: 106.4 fL — ABNORMAL HIGH (ref 80.0–100.0)
Monocytes Absolute: 0.9 10*3/uL (ref 0.1–1.0)
Monocytes Relative: 10 %
Neutro Abs: 6.2 10*3/uL (ref 1.7–7.7)
Neutrophils Relative %: 65 %
Platelet Count: 552 10*3/uL — ABNORMAL HIGH (ref 150–400)
RBC: 3.12 MIL/uL — ABNORMAL LOW (ref 3.87–5.11)
RDW: 16.7 % — ABNORMAL HIGH (ref 11.5–15.5)
WBC Count: 9.4 10*3/uL (ref 4.0–10.5)
nRBC: 0.3 % — ABNORMAL HIGH (ref 0.0–0.2)

## 2020-03-18 LAB — SAVE SMEAR(SSMR), FOR PROVIDER SLIDE REVIEW

## 2020-03-18 LAB — LACTATE DEHYDROGENASE: LDH: 436 U/L — ABNORMAL HIGH (ref 98–192)

## 2020-03-18 MED ORDER — RIVAROXABAN 10 MG PO TABS: 10.0000 mg | ORAL_TABLET | Freq: Every day | ORAL | 3 refills | Status: DC

## 2020-03-18 NOTE — Progress Notes (Signed)
Dr. Marin Olp notified of US Venous Bilateral results.  Order received for pt to take Xarelto 10 mg PO daily.  Pt.'s son notified and requests that Xarelto be sent to the [facility] in [place].

## 2020-03-18 NOTE — Progress Notes (Signed)
Hematology and Oncology Follow Up Visit  JESUS POPLIN [id number] September 30, 1950 69 y.o. 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 daily for 2 months-start treatment on 03/18/2020  HISTORY: Ms. Ardito is back for follow-up.  I am absolutely shocked to see what kind of shape she is in right now.  She was admitted to [facility] up in [place].she was there for 15 days.  She apparently had pneumonia on her right side.  She had a thoracentesis.  She had 430 cc of fluid taken off.  I do not think there was any malignancy in the fluid.  She had "sepsis".  She was on antibiotics.  She is incredibly weak.  It looks like she has aged about 20 years.  I am just stunned to see that she has gotten so weak.  She can barely move her legs.  She has a lot of pain in her legs.  There are some swelling in her legs.  She has not had the interferon for a month.  I am not sure if she had in the hospital.  It does not sound like she had in the hospital.  She has had some delirium.  She has had some confusion.  I suppose this was from the "sepsis."  She apparently was told that she had a stroke.  I cannot find anywhere in the discharge summary that she had any type of CVA.  She never had any brain CT scan or MRI.  She really cannot be by herself.  I think she is with her son or daughter.  She is getting some physical therapy at home.  We did do a Doppler of her legs.  I was  worried that she may have had a thrombus.  In fact, she does have a superficial thrombus in the left lower leg.  I think that we are going to have to treat this with Xarelto.  I would put her on 10 mg of Xarelto daily.  We probably will have her take this for maybe 2 months.    Is hard to say how well she is eating.  I suspect she probably has lost quite a bit of weight.  There has been no rashes.  She has had no obvious bleeding.  She has had no diarrhea.  Overall, I would say her performance status is probably ECOG 3.       Medications:  Allergies as of 03/18/2020   No Known Allergies     Medication List       Accurate as of March 18, 2020  4:54 PM. If you have any questions, ask your nurse or doctor.        albuterol 108 (90 Base) MCG/ACT inhaler Commonly known as: VENTOLIN HFA INHALE 2 PUFFS BY MOUTH EVERY 6 HOURS AS NEEDED FOR WHEEZING OR SHORTNESS OF BREATH   Aller-Chlor 4 MG tablet Generic drug: chlorpheniramine TAKE 2 TABLETS BY MOUTH EVERY NIGHT AT BEDTIME  AS NEEDED FOR ALLERGIES   ALPRAZolam 1 MG tablet Commonly known as: XANAX TAKE 1 TABLET BY MOUTH THREE TIMES DAILY AS NEEDED FOR ANXIETY   alum & mag hydroxide-simeth 200-200-25 MG chewable tablet Commonly known as: GELUSIL Chew by mouth.   atorvastatin 10 MG tablet Commonly known as: LIPITOR Take 1 tablet (10 mg total) by mouth daily.   B-D 3CC LUER-LOK SYR 25GX1" 25G X 1" 3 ML Misc Generic drug: SYRINGE-NEEDLE (DISP) 3 ML 1 Syringe by Subdermal route once a week.   B-D 3CC LUER-LOK SYR 25GX1" 25G X 1" 3 ML Misc Generic drug: SYRINGE-NEEDLE (DISP) 3 ML Inject into the skin.   budesonide-formoterol 80-4.5 MCG/ACT inhaler Commonly known as: Symbicort Inhale 2 puffs into the lungs 2 (two) times daily.   busPIRone 15 MG tablet Commonly known as: BUSPAR TAKE (1) TABLET BY MOUTH FOUR TIMES A DAY AS NEEDED   Cranberry 300 MG tablet Take 300 mg by mouth 2 (two) times daily.   cyanocobalamin 1000 MCG/ML  injection Commonly known as: (VITAMIN B-12) Inject 1 ml into muscle q week x 4 then q monthly   cyclobenzaprine 10 MG tablet Commonly known as: FLEXERIL Take 1 tablet (10 mg total) by mouth 2 (two) times daily as needed for muscle spasms.   diphenhydrAMINE 12.5 MG/5ML liquid Commonly known as: EQ Allergy Relief Childrens TAKE 5 ML BY MOUTH THREE TIMES DAILY AS NEEDED FOR MOUTH  PAIN   estradiol 0.5 MG tablet Commonly known as: ESTRACE TAKE ONE (1) TABLET BY MOUTH TWICE DAILY   famotidine 40 MG tablet Commonly known as: PEPCID Take 1 tablet (40 mg total) by mouth daily.   famotidine 20 MG tablet Commonly known as: PEPCID Take 20 mg by mouth 2 (two) times daily.   fluconazole 150 MG tablet Commonly known as: DIFLUCAN TAKE (1) TABLET BY MOUTH ONCE WEEKLY   fluticasone 50 MCG/ACT nasal spray Commonly known as: FLONASE INSTILL TWO (2) SPRAYS IN EACH NOSTRIL ONCE DAILY   furosemide 20 MG tablet Commonly known as: LASIX TAKE 1 TABLET BY MOUTH 3 TIMES DAILY AS NEEDED FOR EDEMA   gabapentin 400 MG capsule Commonly known as: NEURONTIN Take 1 capsule (400 mg total) by mouth daily.   gabapentin 100 MG capsule Commonly known as: NEURONTIN TAKE FOUR (4) CAPSULES BY MOUTH EVERY NIGHT AT BEDTIME   levothyroxine 100 MCG tablet Commonly known as: SYNTHROID TAKE 1 TABLET BY MOUTH EVERY MORNING BEFORE BREAKFAST   magic mouthwash Soln Take 5 mLs by mouth 3 (three) times daily as needed for mouth pain. Components benadryl  525 mg, hydrocortisone 60 mg and nystatin 0.6 mg. 240 ml - Oral   meclizine 12.5 MG tablet Commonly known as: ANTIVERT TAKE 1 TABLET BY MOUTH THREE TIMES DAILY AS NEEDED FOR DIZZINESS   montelukast 10 MG tablet Commonly known as: SINGULAIR TAKE 1 TABLET BY MOUTH AT BEDTIME   MULTIVITAMIN PO Take 1 tablet by mouth every morning.   Nebulizers Misc USE AS DIRECTED   nystatin 100000 UNIT/ML suspension Commonly known as: MYCOSTATIN Take 5 mLs (500,000 Units  total) by mouth 4 (four) times daily.   ondansetron 8 MG tablet Commonly known as: ZOFRAN TAKE 1/2-1 TABLET BY MOUTH EVERY 8 HOURS AS NEEDED FOR NAUSEA AND VOMITING   optichamber diamond Misc USE as directed. Use with inhaler   oxyCODONE-acetaminophen 10-325 MG tablet Commonly known as: PERCOCET Take 1 tablet by mouth every 6 (six) hours as needed for pain.   Pegasys 180 MCG/ML injection Generic drug:  peginterferon alfa-2a Inject 0.75 mLs (135 mcg total) into the skin every 7 (seven) days.   Restasis 0.05 % ophthalmic emulsion Generic drug: cycloSPORINE PLACE ONE DROP INTO EACH EYE EVERY 12 HOURS   rivaroxaban 10 MG Tabs tablet Commonly known as: XARELTO Take 1 tablet (10 mg total) by mouth daily. Started by: San Morelle, RN   Sancuso 3.1 MG/24HR Generic drug: granisetron APPLY 1 PATCH TOPICALLY AS NEEDED FOR NAUSEA, REMOVE AFTER 7 DAYS   sodium chloride (hypertonic) 3 % solution SMARTSIG:Via Nebulizer   sodium chloride HYPERTONIC 3 % nebulizer solution Take by nebulization daily.   SSD 1 % cream Generic drug: silver sulfADIAZINE APPLY TOPICALLY DAILY   traZODone 100 MG tablet Commonly known as: DESYREL TAKE 1 TABLET BY MOUTH AT BEDTIME   TUBERCULIN SYR 1CC/27GX1/2" 27G X 1/2" 1 ML Misc Commonly known as: B-D TB SYRINGE 1CC/27GX1/2" USE AS DIRECTED ONCE WEEKLY FOR PEG-INTERFERON INJECTIONS   venlafaxine XR 75 MG 24 hr capsule Commonly known as: EFFEXOR-XR TAKE 1 CAPSULE BY MOUTH THREE TIMES DAILY       Allergies: No Known Allergies  Past Medical History, Surgical history, Social history, and Family History were reviewed and updated.  Review of Systems: Review of Systems  Constitutional: Positive for diaphoresis and malaise/fatigue.  HENT: Negative.   Eyes: Negative.   Respiratory: Positive for shortness of breath.   Cardiovascular: Negative.   Gastrointestinal: Positive for abdominal pain, heartburn and nausea.  Genitourinary: Negative.     Musculoskeletal: Positive for joint pain and myalgias.  Skin: Negative.   Neurological: Positive for dizziness.  Endo/Heme/Allergies: Negative.   Psychiatric/Behavioral: Negative.      Physical Exam:  temperature is 99 F (37.2 C). Her blood pressure is 117/60 and her pulse is 83. Her respiration is 18 and oxygen saturation is 96%.   Wt Readings from Last 3 Encounters:  02/13/20 146 lb (66.2 kg)  02/05/20 144 lb (65.3 kg)  01/30/20 144 lb 3.2 oz (65.4 kg)    Physical Exam Vitals reviewed.  HENT:     Head: Normocephalic and atraumatic.  Eyes:     Pupils: Pupils are equal, round, and reactive to light.  Cardiovascular:     Rate and Rhythm: Normal rate and regular rhythm.     Heart sounds: Normal heart sounds.  Pulmonary:     Effort: Pulmonary effort is normal.     Breath sounds: Normal breath sounds.  Abdominal:     General: Bowel sounds are normal.     Palpations: Abdomen is soft.     Comments: Her spleen tip is palpable a couple centimeters below the left costal margin.  Musculoskeletal:        General: No tenderness or deformity. Normal range of motion.     Cervical back: Normal range of motion.  Lymphadenopathy:     Cervical: No cervical adenopathy.  Skin:    General: Skin is warm and dry.     Findings: No erythema or rash.  Neurological:     Mental Status: She is alert and oriented to person, place, and time.  Psychiatric:        Behavior: Behavior normal.        Thought Content: Thought content normal.        Judgment: Judgment normal.      Lab Results  Component Value Date   WBC 9.4 03/18/2020   HGB 10.1 (L) 03/18/2020   HCT 33.2 (L) 03/18/2020   MCV 106.4 (H) 03/18/2020   PLT 552 (H)  03/18/2020   Lab Results  Component Value Date   FERRITIN 3,203 (H) 01/30/2020   IRON 85 01/30/2020   TIBC 193 (L) 01/30/2020   UIBC 107 (L) 01/30/2020   IRONPCTSAT 44 01/30/2020   Lab Results  Component Value Date   RETICCTPCT 2.1 12/19/2019   RBC 3.12 (L)  03/18/2020   RETICCTABS 45.8 06/02/2015   No results found for: KPAFRELGTCHN, LAMBDASER, KAPLAMBRATIO No results found for: IGGSERUM, IGA, IGMSERUM No results found for: Odetta Pink, SPEI   Chemistry      Component Value Date/Time   NA 143 03/18/2020 1027   NA 143 07/16/2017 1057   NA 140 11/29/2016 0936   K 3.2 (L) 03/18/2020 1027   K 3.2 (L) 07/16/2017 1057   K 4.2 11/29/2016 0936   CL 93 (L) 03/18/2020 1027   CL 104 07/16/2017 1057   CO2 45 (H) 03/18/2020 1027   CO2 25 07/16/2017 1057   CO2 27 11/29/2016 0936   BUN 16 03/18/2020 1027   BUN 10 07/16/2017 1057   BUN 18.3 11/29/2016 0936   CREATININE 0.82 03/18/2020 1027   CREATININE 0.8 07/16/2017 1057   CREATININE 0.8 11/29/2016 0936      Component Value Date/Time   CALCIUM 9.0 03/18/2020 1027   CALCIUM 8.9 07/16/2017 1057   CALCIUM 8.9 11/29/2016 0936   ALKPHOS 194 (H) 03/18/2020 1027   ALKPHOS 71 07/16/2017 1057   ALKPHOS 77 11/29/2016 0936   AST 61 (H) 03/18/2020 1027   AST 19 11/29/2016 0936   ALT 25 03/18/2020 1027   ALT 27 07/16/2017 1057   ALT 10 11/29/2016 0936   BILITOT 0.6 03/18/2020 1027   BILITOT <0.22 11/29/2016 0936      Impression and Plan: Ms. Mentzer is a very pleasant 69 yo caucasian female with essential thrombocythemia.   I really feel bad for Ms. Loftus.  She has always come in here walking and wearing a lot of jewelry.  This is just a total surprise as to how rough she had in the hospital.  I am glad that her platelet count really has not changed since we last saw her.  We called in the interferon to her pharmacy.  Her family will pick it up today and give her a dose today.  I spent over an hour with her today.  This was incredibly complicated.  She just was in such a rough shape.  I am glad that her son was with her.  I would like to see her back in about 3-4 weeks and hopefully she will be improving.  We will have to make sure we do  another Doppler of her left leg in October.    [name], MD 8/19/20214:54 PM

## 2020-03-19 ENCOUNTER — Telehealth: Payer: Self-pay | Admitting: Hematology & Oncology

## 2020-03-19 LAB — FERRITIN: Ferritin: 4360 ng/mL — ABNORMAL HIGH (ref 11–307)

## 2020-03-19 LAB — IRON AND TIBC
Iron: 32 ug/dL — ABNORMAL LOW (ref 41–142)
Saturation Ratios: 24 % (ref 21–57)
TIBC: 136 ug/dL — ABNORMAL LOW (ref 236–444)
UIBC: 104 ug/dL — ABNORMAL LOW (ref 120–384)

## 2020-03-19 NOTE — Telephone Encounter (Signed)
No los 8/19

## 2020-03-23 DIAGNOSIS — I959 Hypotension, unspecified: Secondary | ICD-10-CM | POA: Diagnosis not present

## 2020-03-23 DIAGNOSIS — R0902 Hypoxemia: Secondary | ICD-10-CM | POA: Diagnosis not present

## 2020-03-23 DIAGNOSIS — J439 Emphysema, unspecified: Secondary | ICD-10-CM | POA: Diagnosis not present

## 2020-03-23 DIAGNOSIS — E876 Hypokalemia: Secondary | ICD-10-CM | POA: Diagnosis not present

## 2020-03-23 DIAGNOSIS — A419 Sepsis, unspecified organism: Secondary | ICD-10-CM | POA: Diagnosis not present

## 2020-03-23 DIAGNOSIS — J189 Pneumonia, unspecified organism: Secondary | ICD-10-CM | POA: Diagnosis not present

## 2020-03-26 DIAGNOSIS — J439 Emphysema, unspecified: Secondary | ICD-10-CM | POA: Diagnosis not present

## 2020-03-26 DIAGNOSIS — I959 Hypotension, unspecified: Secondary | ICD-10-CM | POA: Diagnosis not present

## 2020-03-26 DIAGNOSIS — R0902 Hypoxemia: Secondary | ICD-10-CM | POA: Diagnosis not present

## 2020-03-26 DIAGNOSIS — A419 Sepsis, unspecified organism: Secondary | ICD-10-CM | POA: Diagnosis not present

## 2020-03-26 DIAGNOSIS — J189 Pneumonia, unspecified organism: Secondary | ICD-10-CM | POA: Diagnosis not present

## 2020-03-26 DIAGNOSIS — E876 Hypokalemia: Secondary | ICD-10-CM | POA: Diagnosis not present

## 2020-03-28 ENCOUNTER — Other Ambulatory Visit: Payer: Self-pay | Admitting: Family Medicine

## 2020-03-28 DIAGNOSIS — G8929 Other chronic pain: Secondary | ICD-10-CM

## 2020-03-28 DIAGNOSIS — M549 Dorsalgia, unspecified: Secondary | ICD-10-CM

## 2020-03-29 ENCOUNTER — Encounter: Payer: Self-pay | Admitting: Family Medicine

## 2020-03-29 DIAGNOSIS — I959 Hypotension, unspecified: Secondary | ICD-10-CM | POA: Diagnosis not present

## 2020-03-29 DIAGNOSIS — J439 Emphysema, unspecified: Secondary | ICD-10-CM | POA: Diagnosis not present

## 2020-03-29 DIAGNOSIS — A419 Sepsis, unspecified organism: Secondary | ICD-10-CM | POA: Diagnosis not present

## 2020-03-29 DIAGNOSIS — E876 Hypokalemia: Secondary | ICD-10-CM | POA: Diagnosis not present

## 2020-03-29 DIAGNOSIS — R0902 Hypoxemia: Secondary | ICD-10-CM | POA: Diagnosis not present

## 2020-03-29 DIAGNOSIS — J189 Pneumonia, unspecified organism: Secondary | ICD-10-CM | POA: Diagnosis not present

## 2020-03-29 NOTE — Telephone Encounter (Signed)
Last written: 02/27/20 Last ov: 02/05/20 Next ov: none Contract:  UDS: 10/09/19

## 2020-03-30 ENCOUNTER — Other Ambulatory Visit: Payer: Self-pay | Admitting: Family Medicine

## 2020-03-30 DIAGNOSIS — J189 Pneumonia, unspecified organism: Secondary | ICD-10-CM | POA: Diagnosis not present

## 2020-03-30 DIAGNOSIS — A419 Sepsis, unspecified organism: Secondary | ICD-10-CM | POA: Diagnosis not present

## 2020-03-30 DIAGNOSIS — E876 Hypokalemia: Secondary | ICD-10-CM | POA: Diagnosis not present

## 2020-03-30 DIAGNOSIS — J439 Emphysema, unspecified: Secondary | ICD-10-CM | POA: Diagnosis not present

## 2020-03-30 DIAGNOSIS — R0902 Hypoxemia: Secondary | ICD-10-CM | POA: Diagnosis not present

## 2020-03-30 DIAGNOSIS — I959 Hypotension, unspecified: Secondary | ICD-10-CM | POA: Diagnosis not present

## 2020-03-30 MED ORDER — OXYCODONE-ACETAMINOPHEN 10-325 MG PO TABS: 1.0000 | ORAL_TABLET | Freq: Four times a day (QID) | ORAL | 0 refills | Status: DC | PRN

## 2020-03-30 NOTE — Telephone Encounter (Signed)
Left Voicemail message to call back. [place]

## 2020-04-01 DIAGNOSIS — I959 Hypotension, unspecified: Secondary | ICD-10-CM | POA: Diagnosis not present

## 2020-04-01 DIAGNOSIS — E876 Hypokalemia: Secondary | ICD-10-CM | POA: Diagnosis not present

## 2020-04-01 DIAGNOSIS — R0902 Hypoxemia: Secondary | ICD-10-CM | POA: Diagnosis not present

## 2020-04-01 DIAGNOSIS — J439 Emphysema, unspecified: Secondary | ICD-10-CM | POA: Diagnosis not present

## 2020-04-01 DIAGNOSIS — A419 Sepsis, unspecified organism: Secondary | ICD-10-CM | POA: Diagnosis not present

## 2020-04-01 DIAGNOSIS — J189 Pneumonia, unspecified organism: Secondary | ICD-10-CM | POA: Diagnosis not present

## 2020-04-02 DIAGNOSIS — E876 Hypokalemia: Secondary | ICD-10-CM | POA: Diagnosis not present

## 2020-04-02 DIAGNOSIS — R0902 Hypoxemia: Secondary | ICD-10-CM | POA: Diagnosis not present

## 2020-04-02 DIAGNOSIS — I959 Hypotension, unspecified: Secondary | ICD-10-CM | POA: Diagnosis not present

## 2020-04-02 DIAGNOSIS — A419 Sepsis, unspecified organism: Secondary | ICD-10-CM | POA: Diagnosis not present

## 2020-04-02 DIAGNOSIS — J439 Emphysema, unspecified: Secondary | ICD-10-CM | POA: Diagnosis not present

## 2020-04-02 DIAGNOSIS — J189 Pneumonia, unspecified organism: Secondary | ICD-10-CM | POA: Diagnosis not present

## 2020-04-02 MED FILL — BD TB SYRINGE 27GX1/2: 27G X 1/2" | 28 days supply | Qty: 4 | Fill #5

## 2020-04-06 DIAGNOSIS — R0902 Hypoxemia: Secondary | ICD-10-CM | POA: Diagnosis not present

## 2020-04-06 DIAGNOSIS — J189 Pneumonia, unspecified organism: Secondary | ICD-10-CM | POA: Diagnosis not present

## 2020-04-06 DIAGNOSIS — I959 Hypotension, unspecified: Secondary | ICD-10-CM | POA: Diagnosis not present

## 2020-04-06 DIAGNOSIS — E876 Hypokalemia: Secondary | ICD-10-CM | POA: Diagnosis not present

## 2020-04-06 DIAGNOSIS — A419 Sepsis, unspecified organism: Secondary | ICD-10-CM | POA: Diagnosis not present

## 2020-04-06 DIAGNOSIS — J439 Emphysema, unspecified: Secondary | ICD-10-CM | POA: Diagnosis not present

## 2020-04-06 MED FILL — PEGASYS 180 MCG/ML VIAL: 180 | 28 days supply | Qty: 4 | Fill #4

## 2020-04-08 DIAGNOSIS — A419 Sepsis, unspecified organism: Secondary | ICD-10-CM | POA: Diagnosis not present

## 2020-04-08 DIAGNOSIS — R0902 Hypoxemia: Secondary | ICD-10-CM | POA: Diagnosis not present

## 2020-04-08 DIAGNOSIS — J439 Emphysema, unspecified: Secondary | ICD-10-CM | POA: Diagnosis not present

## 2020-04-08 DIAGNOSIS — I959 Hypotension, unspecified: Secondary | ICD-10-CM | POA: Diagnosis not present

## 2020-04-08 DIAGNOSIS — J189 Pneumonia, unspecified organism: Secondary | ICD-10-CM | POA: Diagnosis not present

## 2020-04-08 DIAGNOSIS — E876 Hypokalemia: Secondary | ICD-10-CM | POA: Diagnosis not present

## 2020-04-09 ENCOUNTER — Telehealth: Payer: Self-pay | Admitting: Family Medicine

## 2020-04-09 DIAGNOSIS — J439 Emphysema, unspecified: Secondary | ICD-10-CM | POA: Diagnosis not present

## 2020-04-09 DIAGNOSIS — A419 Sepsis, unspecified organism: Secondary | ICD-10-CM | POA: Diagnosis not present

## 2020-04-09 DIAGNOSIS — I959 Hypotension, unspecified: Secondary | ICD-10-CM | POA: Diagnosis not present

## 2020-04-09 DIAGNOSIS — E876 Hypokalemia: Secondary | ICD-10-CM | POA: Diagnosis not present

## 2020-04-09 DIAGNOSIS — R0902 Hypoxemia: Secondary | ICD-10-CM | POA: Diagnosis not present

## 2020-04-09 DIAGNOSIS — J189 Pneumonia, unspecified organism: Secondary | ICD-10-CM | POA: Diagnosis not present

## 2020-04-09 NOTE — Telephone Encounter (Signed)
Caller: Otila Kluver ([place]) Call back number: [phone]  Patient has two pressure ulcers.One of them need Fentanyl RX.   Pharmacy:   [facility] [address], [place] Phone:  [phone]  Fax:  [phone]

## 2020-04-10 DIAGNOSIS — E876 Hypokalemia: Secondary | ICD-10-CM | POA: Diagnosis not present

## 2020-04-10 DIAGNOSIS — C946 Myelodysplastic disease, not classified: Secondary | ICD-10-CM | POA: Diagnosis not present

## 2020-04-10 DIAGNOSIS — I959 Hypotension, unspecified: Secondary | ICD-10-CM | POA: Diagnosis not present

## 2020-04-10 DIAGNOSIS — J439 Emphysema, unspecified: Secondary | ICD-10-CM | POA: Diagnosis not present

## 2020-04-10 DIAGNOSIS — Z9981 Dependence on supplemental oxygen: Secondary | ICD-10-CM | POA: Diagnosis not present

## 2020-04-10 DIAGNOSIS — Z79891 Long term (current) use of opiate analgesic: Secondary | ICD-10-CM | POA: Diagnosis not present

## 2020-04-10 DIAGNOSIS — D510 Vitamin B12 deficiency anemia due to intrinsic factor deficiency: Secondary | ICD-10-CM | POA: Diagnosis not present

## 2020-04-10 DIAGNOSIS — R0902 Hypoxemia: Secondary | ICD-10-CM | POA: Diagnosis not present

## 2020-04-10 DIAGNOSIS — E039 Hypothyroidism, unspecified: Secondary | ICD-10-CM | POA: Diagnosis not present

## 2020-04-10 DIAGNOSIS — Z7951 Long term (current) use of inhaled steroids: Secondary | ICD-10-CM | POA: Diagnosis not present

## 2020-04-10 DIAGNOSIS — A419 Sepsis, unspecified organism: Secondary | ICD-10-CM | POA: Diagnosis not present

## 2020-04-10 DIAGNOSIS — J189 Pneumonia, unspecified organism: Secondary | ICD-10-CM | POA: Diagnosis not present

## 2020-04-10 DIAGNOSIS — Z87891 Personal history of nicotine dependence: Secondary | ICD-10-CM | POA: Diagnosis not present

## 2020-04-11 NOTE — Telephone Encounter (Signed)
Please check with home health to confirm what they need for patient. Do they have a protocol they recommend for her pressure sores?

## 2020-04-12 ENCOUNTER — Other Ambulatory Visit: Payer: Self-pay | Admitting: Family Medicine

## 2020-04-12 DIAGNOSIS — R404 Transient alteration of awareness: Secondary | ICD-10-CM | POA: Diagnosis not present

## 2020-04-12 DIAGNOSIS — J439 Emphysema, unspecified: Secondary | ICD-10-CM | POA: Diagnosis not present

## 2020-04-12 DIAGNOSIS — R4020 Unspecified coma: Secondary | ICD-10-CM | POA: Diagnosis not present

## 2020-04-12 DIAGNOSIS — R0902 Hypoxemia: Secondary | ICD-10-CM | POA: Diagnosis not present

## 2020-04-12 DIAGNOSIS — E876 Hypokalemia: Secondary | ICD-10-CM | POA: Diagnosis not present

## 2020-04-12 DIAGNOSIS — A419 Sepsis, unspecified organism: Secondary | ICD-10-CM | POA: Diagnosis not present

## 2020-04-12 DIAGNOSIS — J189 Pneumonia, unspecified organism: Secondary | ICD-10-CM | POA: Diagnosis not present

## 2020-04-12 DIAGNOSIS — I959 Hypotension, unspecified: Secondary | ICD-10-CM | POA: Diagnosis not present

## 2020-04-12 DIAGNOSIS — R52 Pain, unspecified: Secondary | ICD-10-CM | POA: Diagnosis not present

## 2020-04-12 NOTE — Telephone Encounter (Signed)
Left message with verbal orders to [place], [place]

## 2020-04-12 NOTE — Telephone Encounter (Signed)
Please give home health a verbal order to apply Santyl Ointment to pressure sores, disp # 1 tube with 5 rf.

## 2020-04-12 NOTE — Telephone Encounter (Signed)
Per Juliane Poot, RN with [facility], has requested santyl ointment -1 tube for pressure sores.  Please disregard the other Rx request, was incorrect.  Yes, the pharmacy is the same.

## 2020-04-16 ENCOUNTER — Inpatient Hospital Stay (HOSPITAL_COMMUNITY)
Admission: EM | Admit: 2020-04-16 | Discharge: 2020-04-21 | DRG: 871 | Disposition: A | Discharge status: Skilled Nursing Facility | Payer: Medicare Other | Attending: Internal Medicine | Admitting: Internal Medicine

## 2020-04-16 ENCOUNTER — Emergency Department (HOSPITAL_COMMUNITY): Payer: Medicare Other

## 2020-04-16 ENCOUNTER — Encounter (HOSPITAL_COMMUNITY): Payer: Self-pay | Admitting: Emergency Medicine

## 2020-04-16 DIAGNOSIS — M4628 Osteomyelitis of vertebra, sacral and sacrococcygeal region: Secondary | ICD-10-CM | POA: Diagnosis not present

## 2020-04-16 DIAGNOSIS — Z7901 Long term (current) use of anticoagulants: Secondary | ICD-10-CM

## 2020-04-16 DIAGNOSIS — I10 Essential (primary) hypertension: Secondary | ICD-10-CM

## 2020-04-16 DIAGNOSIS — R330 Drug induced retention of urine: Secondary | ICD-10-CM | POA: Diagnosis not present

## 2020-04-16 DIAGNOSIS — D473 Essential (hemorrhagic) thrombocythemia: Secondary | ICD-10-CM | POA: Diagnosis present

## 2020-04-16 DIAGNOSIS — L97419 Non-pressure chronic ulcer of right heel and midfoot with unspecified severity: Secondary | ICD-10-CM | POA: Diagnosis not present

## 2020-04-16 DIAGNOSIS — Z8701 Personal history of pneumonia (recurrent): Secondary | ICD-10-CM

## 2020-04-16 DIAGNOSIS — Z87891 Personal history of nicotine dependence: Secondary | ICD-10-CM

## 2020-04-16 DIAGNOSIS — L89620 Pressure ulcer of left heel, unstageable: Secondary | ICD-10-CM

## 2020-04-16 DIAGNOSIS — Z20822 Contact with and (suspected) exposure to covid-19: Secondary | ICD-10-CM | POA: Diagnosis not present

## 2020-04-16 DIAGNOSIS — E43 Unspecified severe protein-calorie malnutrition: Secondary | ICD-10-CM

## 2020-04-16 DIAGNOSIS — Z823 Family history of stroke: Secondary | ICD-10-CM

## 2020-04-16 DIAGNOSIS — Z7189 Other specified counseling: Secondary | ICD-10-CM

## 2020-04-16 DIAGNOSIS — J189 Pneumonia, unspecified organism: Secondary | ICD-10-CM | POA: Diagnosis not present

## 2020-04-16 DIAGNOSIS — Z6823 Body mass index (BMI) 23.0-23.9, adult: Secondary | ICD-10-CM

## 2020-04-16 DIAGNOSIS — L98499 Non-pressure chronic ulcer of skin of other sites with unspecified severity: Secondary | ICD-10-CM | POA: Diagnosis not present

## 2020-04-16 DIAGNOSIS — Z79890 Hormone replacement therapy (postmenopausal): Secondary | ICD-10-CM

## 2020-04-16 DIAGNOSIS — G9340 Encephalopathy, unspecified: Secondary | ICD-10-CM

## 2020-04-16 DIAGNOSIS — Z7951 Long term (current) use of inhaled steroids: Secondary | ICD-10-CM

## 2020-04-16 DIAGNOSIS — Z7401 Bed confinement status: Secondary | ICD-10-CM

## 2020-04-16 DIAGNOSIS — R0902 Hypoxemia: Secondary | ICD-10-CM | POA: Diagnosis not present

## 2020-04-16 DIAGNOSIS — R300 Dysuria: Secondary | ICD-10-CM

## 2020-04-16 DIAGNOSIS — Z8249 Family history of ischemic heart disease and other diseases of the circulatory system: Secondary | ICD-10-CM

## 2020-04-16 DIAGNOSIS — M869 Osteomyelitis, unspecified: Secondary | ICD-10-CM | POA: Diagnosis present

## 2020-04-16 DIAGNOSIS — K219 Gastro-esophageal reflux disease without esophagitis: Secondary | ICD-10-CM | POA: Diagnosis present

## 2020-04-16 DIAGNOSIS — L89159 Pressure ulcer of sacral region, unspecified stage: Secondary | ICD-10-CM | POA: Diagnosis not present

## 2020-04-16 DIAGNOSIS — L89154 Pressure ulcer of sacral region, stage 4: Secondary | ICD-10-CM

## 2020-04-16 DIAGNOSIS — K5900 Constipation, unspecified: Secondary | ICD-10-CM | POA: Diagnosis present

## 2020-04-16 DIAGNOSIS — Z8041 Family history of malignant neoplasm of ovary: Secondary | ICD-10-CM

## 2020-04-16 DIAGNOSIS — Z23 Encounter for immunization: Secondary | ICD-10-CM | POA: Diagnosis not present

## 2020-04-16 DIAGNOSIS — E559 Vitamin D deficiency, unspecified: Secondary | ICD-10-CM

## 2020-04-16 DIAGNOSIS — A419 Sepsis, unspecified organism: Secondary | ICD-10-CM | POA: Diagnosis not present

## 2020-04-16 DIAGNOSIS — I82819 Embolism and thrombosis of superficial veins of unspecified lower extremities: Secondary | ICD-10-CM | POA: Diagnosis present

## 2020-04-16 DIAGNOSIS — Z515 Encounter for palliative care: Secondary | ICD-10-CM

## 2020-04-16 DIAGNOSIS — Z825 Family history of asthma and other chronic lower respiratory diseases: Secondary | ICD-10-CM

## 2020-04-16 DIAGNOSIS — G3184 Mild cognitive impairment, so stated: Secondary | ICD-10-CM | POA: Diagnosis present

## 2020-04-16 DIAGNOSIS — R918 Other nonspecific abnormal finding of lung field: Secondary | ICD-10-CM | POA: Diagnosis not present

## 2020-04-16 DIAGNOSIS — R531 Weakness: Secondary | ICD-10-CM

## 2020-04-16 DIAGNOSIS — I69354 Hemiplegia and hemiparesis following cerebral infarction affecting left non-dominant side: Secondary | ICD-10-CM

## 2020-04-16 DIAGNOSIS — J449 Chronic obstructive pulmonary disease, unspecified: Secondary | ICD-10-CM | POA: Diagnosis present

## 2020-04-16 DIAGNOSIS — T68XXXA Hypothermia, initial encounter: Secondary | ICD-10-CM | POA: Diagnosis not present

## 2020-04-16 DIAGNOSIS — R52 Pain, unspecified: Secondary | ICD-10-CM | POA: Diagnosis not present

## 2020-04-16 DIAGNOSIS — D509 Iron deficiency anemia, unspecified: Secondary | ICD-10-CM | POA: Diagnosis present

## 2020-04-16 DIAGNOSIS — Z79899 Other long term (current) drug therapy: Secondary | ICD-10-CM

## 2020-04-16 DIAGNOSIS — G8929 Other chronic pain: Secondary | ICD-10-CM

## 2020-04-16 DIAGNOSIS — E039 Hypothyroidism, unspecified: Secondary | ICD-10-CM | POA: Diagnosis present

## 2020-04-16 DIAGNOSIS — R569 Unspecified convulsions: Secondary | ICD-10-CM | POA: Diagnosis present

## 2020-04-16 DIAGNOSIS — F418 Other specified anxiety disorders: Secondary | ICD-10-CM | POA: Diagnosis present

## 2020-04-16 DIAGNOSIS — D638 Anemia in other chronic diseases classified elsewhere: Secondary | ICD-10-CM | POA: Diagnosis present

## 2020-04-16 DIAGNOSIS — L8995 Pressure ulcer of unspecified site, unstageable: Secondary | ICD-10-CM | POA: Diagnosis not present

## 2020-04-16 DIAGNOSIS — Z993 Dependence on wheelchair: Secondary | ICD-10-CM

## 2020-04-16 DIAGNOSIS — R627 Adult failure to thrive: Secondary | ICD-10-CM

## 2020-04-16 DIAGNOSIS — G9349 Other encephalopathy: Secondary | ICD-10-CM | POA: Diagnosis present

## 2020-04-16 DIAGNOSIS — T40605A Adverse effect of unspecified narcotics, initial encounter: Secondary | ICD-10-CM | POA: Diagnosis not present

## 2020-04-16 DIAGNOSIS — E876 Hypokalemia: Secondary | ICD-10-CM | POA: Diagnosis not present

## 2020-04-16 DIAGNOSIS — D75839 Thrombocytosis, unspecified: Secondary | ICD-10-CM | POA: Diagnosis present

## 2020-04-16 DIAGNOSIS — R739 Hyperglycemia, unspecified: Secondary | ICD-10-CM

## 2020-04-16 DIAGNOSIS — J470 Bronchiectasis with acute lower respiratory infection: Secondary | ICD-10-CM | POA: Diagnosis present

## 2020-04-16 DIAGNOSIS — R000 Tachycardia, unspecified: Secondary | ICD-10-CM | POA: Diagnosis not present

## 2020-04-16 DIAGNOSIS — N179 Acute kidney failure, unspecified: Secondary | ICD-10-CM | POA: Diagnosis present

## 2020-04-16 LAB — COMPREHENSIVE METABOLIC PANEL
ALT: 27 U/L (ref 0–44)
AST: 78 U/L — ABNORMAL HIGH (ref 15–41)
Albumin: 2.5 g/dL — ABNORMAL LOW (ref 3.5–5.0)
Alkaline Phosphatase: 182 U/L — ABNORMAL HIGH (ref 38–126)
Anion gap: 10 (ref 5–15)
BUN: 30 mg/dL — ABNORMAL HIGH (ref 8–23)
CO2: 33 mmol/L — ABNORMAL HIGH (ref 22–32)
Calcium: 8.8 mg/dL — ABNORMAL LOW (ref 8.9–10.3)
Chloride: 96 mmol/L — ABNORMAL LOW (ref 98–111)
Creatinine, Ser: 1.06 mg/dL — ABNORMAL HIGH (ref 0.44–1.00)
GFR calc Af Amer: >60 mL/min (ref >60)
GFR calc non Af Amer: 54 mL/min — ABNORMAL LOW (ref >60)
Glucose, Bld: 103 mg/dL — ABNORMAL HIGH (ref 70–99)
Potassium: 2.4 mmol/L — CL (ref 3.5–5.1)
Sodium: 139 mmol/L (ref 135–145)
Total Bilirubin: 1.1 mg/dL (ref 0.3–1.2)
Total Protein: 7.2 g/dL (ref 6.5–8.1)

## 2020-04-16 LAB — CBC WITH DIFFERENTIAL/PLATELET
Abs Immature Granulocytes: 0.13 10*3/uL — ABNORMAL HIGH (ref 0.00–0.07)
Basophils Absolute: 0 10*3/uL (ref 0.0–0.1)
Basophils Relative: 0 %
Eosinophils Absolute: 0.1 10*3/uL (ref 0.0–0.5)
Eosinophils Relative: 1 %
HCT: 31 % — ABNORMAL LOW (ref 36.0–46.0)
Hemoglobin: 9.3 g/dL — ABNORMAL LOW (ref 12.0–15.0)
Immature Granulocytes: 1 %
Lymphocytes Relative: 7 %
Lymphs Abs: 1 10*3/uL (ref 0.7–4.0)
MCH: 31.8 pg (ref 26.0–34.0)
MCHC: 30 g/dL (ref 30.0–36.0)
MCV: 106.2 fL — ABNORMAL HIGH (ref 80.0–100.0)
Monocytes Absolute: 0.8 10*3/uL (ref 0.1–1.0)
Monocytes Relative: 6 %
Neutro Abs: 11.4 10*3/uL — ABNORMAL HIGH (ref 1.7–7.7)
Neutrophils Relative %: 85 %
Platelets: 475 10*3/uL — ABNORMAL HIGH (ref 150–400)
RBC: 2.92 MIL/uL — ABNORMAL LOW (ref 3.87–5.11)
RDW: 15.3 % (ref 11.5–15.5)
WBC: 13.4 10*3/uL — ABNORMAL HIGH (ref 4.0–10.5)
nRBC: 0 % (ref 0.0–0.2)

## 2020-04-16 LAB — MAGNESIUM: Magnesium: 2.1 mg/dL (ref 1.7–2.4)

## 2020-04-16 LAB — SARS CORONAVIRUS 2 BY RT PCR (HOSPITAL ORDER, PERFORMED IN ~~LOC~~ HOSPITAL LAB): SARS Coronavirus 2: NEGATIVE

## 2020-04-16 LAB — LACTIC ACID, PLASMA: Lactic Acid, Venous: 1.2 mmol/L (ref 0.5–1.9)

## 2020-04-16 MED ORDER — SODIUM CHLORIDE 0.9 % IV BOLUS
1000.0000 mL | Freq: Once | INTRAVENOUS | Status: AC
  Administered 2020-04-16: 1000 mL via INTRAVENOUS

## 2020-04-16 MED ORDER — PIPERACILLIN-TAZOBACTAM 3.375 G IVPB
3.3750 g | Freq: Three times a day (TID) | INTRAVENOUS | Status: DC
  Administered 2020-04-17 – 2020-04-19 (×8): 3.375 g via INTRAVENOUS
  Filled 2020-04-16 (×8): qty 50

## 2020-04-16 MED ORDER — POTASSIUM CHLORIDE CRYS ER 20 MEQ PO TBCR
40.0000 meq | EXTENDED_RELEASE_TABLET | Freq: Once | ORAL | Status: AC
  Administered 2020-04-16: 40 meq via ORAL
  Filled 2020-04-16: qty 2

## 2020-04-16 MED ORDER — VANCOMYCIN HCL IN DEXTROSE 1-5 GM/200ML-% IV SOLN
1000.0000 mg | INTRAVENOUS | Status: AC
  Administered 2020-04-16: 1000 mg via INTRAVENOUS
  Filled 2020-04-16: qty 200

## 2020-04-16 MED ORDER — FENTANYL CITRATE (PF) 100 MCG/2ML IJ SOLN
50.0000 ug | Freq: Once | INTRAMUSCULAR | Status: AC
  Administered 2020-04-16: 50 ug via INTRAVENOUS
  Filled 2020-04-16: qty 2

## 2020-04-16 MED ORDER — PIPERACILLIN-TAZOBACTAM 3.375 G IVPB 30 MIN
3.3750 g | Freq: Once | INTRAVENOUS | Status: AC
  Administered 2020-04-16: 3.375 g via INTRAVENOUS
  Filled 2020-04-16: qty 50

## 2020-04-16 MED ORDER — POTASSIUM CHLORIDE 10 MEQ/100ML IV SOLN
10.0000 meq | INTRAVENOUS | Status: AC
  Administered 2020-04-17: 10 meq via INTRAVENOUS
  Filled 2020-04-16 (×7): qty 100

## 2020-04-16 MED ORDER — VANCOMYCIN HCL 1250 MG/250ML IV SOLN
1250.0000 mg | INTRAVENOUS | Status: DC
  Administered 2020-04-17 – 2020-04-21 (×4): 1250 mg via INTRAVENOUS
  Filled 2020-04-16 (×4): qty 250

## 2020-04-16 NOTE — ED Triage Notes (Signed)
EMS called to home where it was filled with bedbugs etc  Pt has a lage unstagable bedsore to her sacrum  She reports it has been there for 2 months  She has a deep tissue injury to her R heel

## 2020-04-16 NOTE — Progress Notes (Signed)
Pharmacy Antibiotic Note  Rachael Johnson is a 69 y.o. female admitted on 04/16/2020 with wound infection.  Pharmacy has been consulted for Vanco, Zosyn dosing.  CC/HPI: EMS called for wounds? Large home bedbug infestation, including on patient. - K+ 2.4, Bicarb 33 high.  PMH: Decub ulcer, deep tissue injury to her R heel , CVA, COPD, IDA, arthritis, cataracts, depression, anxiety, diverticulitis, GERD, thrombocythemia, tension HA, HPylori 2014, HTN, HLD, RLS, rhabdo 2018, scabies 2016, thyroid dz.   ID: Decub ulcer and deep tissue injury to her R heel - Afebrile. WBC 13.4. Scr 0.82 WNL.   Vanco 9/17>> Zosyn 9/17>>  9/17: COVID: neg  Plan: Vancomycin 1g IV x  in ED Vanco 1250mg  IV q24h. Trough after 3-5 doses at steady state. Con't Zosyn 3.375g IV q8hr per Dr. Regenia Skeeter    Height: 5\' 4"  (162.6 cm) Weight: 64.4 kg (142 lb) IBW/kg (Calculated) : 54.7  Temp (24hrs), Avg:98.1 F (36.7 C), Min:98.1 F (36.7 C), Max:98.1 F (36.7 C)  Recent Labs  Lab 04/16/20 2038 04/16/20 2116  WBC 13.4*  --   CREATININE 1.06*  --   LATICACIDVEN  --  1.2    Estimated Creatinine Clearance: 43.9 mL/min (A) (by C-G formula based on SCr of 1.06 mg/dL (H)).    No Known Allergies  Rachael Johnson S. Alford Highland, PharmD, BCPS Clinical Staff Pharmacist [email or web address] [place] 04/16/2020 9:48 PM

## 2020-04-16 NOTE — ED Notes (Signed)
Pt son   Rachael Johnson  [phone]

## 2020-04-16 NOTE — ED Notes (Signed)
Patient had pur wick in place at this time. Patient asking have I seen her white doggie and she needed her pocketbook. Reminded patient that she is in the Emergency Room and that neither are here.

## 2020-04-16 NOTE — ED Provider Notes (Signed)
[facility] EMERGENCY DEPARTMENT Provider Note   CSN: [id number] Arrival date & time: 04/16/20  1910     History Chief Complaint  Patient presents with  . Wound Check    Rachael Johnson is a 69 y.o. female.  HPI 69 year old female presents with sacral wound.  Patient states that she has been having a wound for a couple months.  She has less mobility because of a stroke.  The wound is painful and has drainage.  She thinks she had a fever tonight as well.  Also has a cough that is more prominent recently.  Not really much shortness of breath though she does have COPD.  No vomiting or diarrhea.  Past Medical History:  Diagnosis Date  . Acute renal insufficiency 04/09/2017  . Anemia, iron deficiency 07/08/2012  . Arthritis   . Cataract 12/21/2014  . COPD (chronic obstructive pulmonary disease) ([place]) 01/23/2017   01/23/2017   Walked RA  2 laps @ 185 ft each stopped due to  Cough > > sob/ no desat nl pace  . Depression with anxiety 03/24/2011  . Diverticulitis   . Esophageal reflux 08/17/2013  . Essential thrombocythemia ([place]) 01/24/2011  . Frequent episodic tension-type headache   . Gastroenteritis 12/21/2014  . 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 GERD (gastroesophageal reflux disease)   . H. pylori infection 12/19/2012  . Hyperglycemia 12/17/2015  . Hypertension   . Iron deficiency anemia due to chronic blood loss 04/03/2017  . Osteoarthritis 05/24/2017  . Other and unspecified hyperlipidemia 02/25/2013  . Restless leg syndrome 09/30/2014  . Rhabdomyolysis 02/2017  . Scabies 03/28/2015  . 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 Seizure ([place])    childhood  . Shoulder wound, right, sequela 03/14/2017  . Thyroid disease     Patient Active Problem List   Diagnosis Date Noted  . Pernicious anemia 10/31/2019  . Hypokalemia 10/16/2019  . Insomnia 02/05/2019  . Urinary frequency 06/19/2018  . 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 Sinusitis 11/05/2017  . Chronic back pain 09/09/2017  .  Osteoarthritis 05/24/2017  . Iron deficiency anemia due to chronic blood loss 04/03/2017  . Hypomagnesemia 03/18/2017  . Right shoulder pain 03/14/2017  . LFTs abnormal 03/13/2017  . Hypernatremia 03/13/2017  . Elevated LFTs   . COPD (chronic obstructive pulmonary disease) ([place]) 01/23/2017  . Lung nodule 12/18/2016  . Bronchiectasis assoc with MPNs 11/28/2016  . Cough variant asthma vs UACS 11/27/2016  . Vitamin D deficiency 03/07/2016  . Dysuria 03/07/2016  . Essential hypertension 03/07/2016  . Benign paroxysmal positional vertigo 03/07/2016  . Hyperglycemia 12/17/2015  . Cataract 12/21/2014  . Restless leg syndrome 09/30/2014  . Allergies 04/27/2014  . Constipation 11/24/2013  . Preventative health care 04/20/2013  . Hyperlipidemia 02/25/2013  . H. pylori infection 12/19/2012  . Umbilical hernia 32/99/2426  . Macrocytic anemia 07/08/2012  . Weight loss 12/29/2011  . Depression with anxiety 03/24/2011  . 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 Hypothyroid 02/06/2011  . Thrombocythemia ([place]) 01/24/2011    Past Surgical History:  Procedure Laterality Date  . ABDOMINAL HYSTERECTOMY     1992  . BREAST BIOPSY    . BREAST CYST ASPIRATION    . BREAST SURGERY  1992   biopsy, benign. Fibrocystic  . UMBILICAL HERNIA REPAIR N/A 09/25/2012   Procedure: HERNIA REPAIR UMBILICAL ADULT;  Surgeon: Harl Bowie, MD;  Location: [facility] ORS;  Service: General;  Laterality: N/A;     OB History    Gravida  3   Para  2   Term      Preterm      AB  1   Living  2     SAB  1   TAB      Ectopic      Multiple      Live Births              Family History  Problem Relation Age of Onset  . Arthritis Mother   . Cancer Mother        ovarian  . Hypertension Mother   . Heart disease Mother        pacer  . Heart failure Mother   . Arthritis Father   . Cancer Father        lung  . Hypertension Father   . Cancer Sister        lung  . Cancer Brother        prostate  . Cancer  Brother        lung  . Hypertension Son   . COPD Brother   . Heart disease Brother        died from CHF  . Hypertension Brother   . Cancer Brother        colon  . Alcohol abuse Brother   . Cirrhosis Brother   . Seizures Sister   . Stroke Sister   . Arthritis Brother     Social History   Tobacco Use  . Smoking status: Former Smoker    Packs/day: 3.50    Years: 20.00    Pack years: 70.00    Types: Cigarettes    Start date: 11/15/1970    Quit date: 07/31/1990    Years since quitting: 29.7  . Smokeless tobacco: Never Used  . Tobacco comment: quit 23 years ago  Vaping Use  . Vaping Use: Never used  Substance Use Topics  . Alcohol use: No    Alcohol/week: 0.0 standard drinks  . Drug use: No    Home Medications Prior to Admission medications   Medication Sig Start Date End Date Taking? Authorizing Provider  albuterol (VENTOLIN HFA) 108 (90 Base) MCG/ACT inhaler INHALE 2 PUFFS BY MOUTH EVERY 6 HOURS AS NEEDED FOR WHEEZING OR SHORTNESS OF BREATH 06/09/19   Mosie Lukes, MD  ALLER-CHLOR 4 MG tablet TAKE 2 TABLETS BY MOUTH EVERY NIGHT AT BEDTIME AS NEEDED FOR ALLERGIES Patient taking differently: Take 2 mg by mouth at bedtime as needed for allergies.  02/13/20   Mosie Lukes, MD  ALPRAZolam Duanne Moron) 1 MG tablet TAKE 1 TABLET BY MOUTH THREE TIMES DAILY AS NEEDED FOR ANXIETY Patient taking differently: Take 1 mg by mouth 3 (three) times daily as needed for anxiety.  02/13/20   Mosie Lukes, MD  alum & mag hydroxide-simeth (GELUSIL) 200-200-25 MG chewable tablet Chew by mouth. 04/08/19   [provider]  atorvastatin (LIPITOR) 10 MG tablet Take 1 tablet (10 mg total) by mouth daily. 05/30/19   Mosie Lukes, MD  B-D 3CC LUER-LOK SYR 25GX1" 25G X 1" 3 ML MISC Inject into the skin. 02/23/20   [provider]  budesonide-formoterol (SYMBICORT) 80-4.5 MCG/ACT inhaler Inhale 2 puffs into the lungs 2 (two) times daily. 06/09/19   Mosie Lukes, MD  busPIRone (BUSPAR)  15 MG tablet TAKE (1) TABLET BY MOUTH FOUR TIMES A DAY AS NEEDED Patient taking differently: Take 15 mg by mouth 4 (four) times daily as needed.  12/12/19   Mosie Lukes, MD  Cranberry 300 MG tablet Take 300 mg by mouth 2 (two) times daily.    [provider]  cyanocobalamin (,VITAMIN B-12,) 1000 MCG/ML injection Inject 1 ml into muscle q week x 4 then q monthly Patient taking differently: Inject 1,000 mcg into the muscle every 30 (thirty) days.  10/31/19   [name], MD  cyclobenzaprine (FLEXERIL) 10 MG tablet Take 1 tablet (10 mg total) by mouth 2 (two) times daily as needed for muscle spasms. 01/30/20   Mosie Lukes, MD  diphenhydrAMINE (EQ ALLERGY RELIEF CHILDRENS) 12.5 MG/5ML liquid TAKE 5 ML BY MOUTH THREE TIMES DAILY AS NEEDED FOR MOUTH  PAIN 10/10/19   Mosie Lukes, MD  estradiol (ESTRACE) 0.5 MG tablet TAKE ONE (1) TABLET BY MOUTH TWICE DAILY Patient taking differently: Take 0.5 mg by mouth in the morning and at bedtime.  01/12/20   Mosie Lukes, MD  famotidine (PEPCID) 20 MG tablet Take 20 mg by mouth 2 (two) times daily. 03/04/20   [provider]  famotidine (PEPCID) 40 MG tablet Take 1 tablet (40 mg total) by mouth daily. Patient not taking: Reported on 04/16/2020 05/30/19   Mosie Lukes, MD  fluconazole (DIFLUCAN) 150 MG tablet TAKE (1) TABLET BY MOUTH ONCE WEEKLY Patient taking differently: Take 150 mg by mouth once a week.  12/23/19   Mosie Lukes, MD  fluticasone (FLONASE) 50 MCG/ACT nasal spray INSTILL TWO (2) SPRAYS IN EACH NOSTRIL ONCE DAILY 08/21/19   Mosie Lukes, MD  furosemide (LASIX) 20 MG tablet TAKE 1 TABLET BY MOUTH 3 TIMES DAILY AS NEEDED FOR EDEMA Patient taking differently: Take 20 mg by mouth 3 (three) times daily as needed for fluid.  09/26/19   Mosie Lukes, MD  gabapentin (NEURONTIN) 100 MG capsule TAKE FOUR (4) CAPSULES BY MOUTH EVERY NIGHT AT BEDTIME Patient taking differently: Take 400 mg by mouth at bedtime.  09/26/19    Mosie Lukes, MD  gabapentin (NEURONTIN) 400 MG capsule Take 1 capsule (400 mg total) by mouth daily. Patient not taking: Reported on 04/16/2020 05/30/19   Mosie Lukes, MD  granisetron (SANCUSO) 3.1 MG/24HR APPLY 1 PATCH TOPICALLY AS NEEDED FOR NAUSEA, REMOVE AFTER 7 DAYS Patient taking differently: Place 1 patch onto the skin every 7 (seven) days.  10/08/19   [name], MD  levothyroxine (SYNTHROID) 100 MCG tablet TAKE 1 TABLET BY MOUTH EVERY MORNING BEFORE BREAKFAST Patient taking differently: Take 100 mcg by mouth daily before breakfast.  01/12/20   Mosie Lukes, MD  magic mouthwash SOLN Take 5 mLs by mouth 3 (three) times daily as needed for mouth pain. Components benadryl  525 mg, hydrocortisone 60 mg and nystatin 0.6 mg. 240 ml - Oral 04/08/19   Ennever, Rudell Cobb, MD  meclizine (ANTIVERT) 12.5 MG tablet TAKE 1 TABLET BY MOUTH THREE TIMES DAILY AS NEEDED FOR DIZZINESS Patient taking differently: Take 12.5 mg by mouth 3 (three) times daily as needed for dizziness.  09/26/19   Mosie Lukes, MD  montelukast (SINGULAIR) 10 MG tablet TAKE 1 TABLET BY MOUTH AT BEDTIME Patient taking differently: Take 10 mg by mouth at bedtime.  12/04/19   Mosie Lukes, MD  Multiple Vitamin (MULTIVITAMIN PO) Take 1 tablet by mouth every morning.     [provider]  Nebulizers MISC USE AS DIRECTED 02/16/20   [provider]  nystatin (MYCOSTATIN) 100000 UNIT/ML suspension Take 5 mLs (500,000 Units total) by mouth 4 (four) times daily. 06/13/19  Mosie Lukes, MD  ondansetron (ZOFRAN) 8 MG tablet TAKE 1/2-1 TABLET BY MOUTH EVERY 8 HOURS AS NEEDED FOR NAUSEA AND VOMITING Patient taking differently: Take 4-8 mg by mouth every 8 (eight) hours as needed for nausea or vomiting.  08/15/19   Mosie Lukes, MD  oxyCODONE-acetaminophen (PERCOCET) 10-325 MG tablet Take 1 tablet by mouth every 6 (six) hours as needed for pain. 03/30/20   Mosie Lukes, MD  peginterferon alfa-2a (PEGASYS) 180  MCG/ML injection Inject 0.75 mLs (135 mcg total) into the skin every 7 (seven) days. 03/12/20   [name], MD  RESTASIS 0.05 % ophthalmic emulsion PLACE ONE DROP INTO [facility] EYE EVERY 12 HOURS 06/09/19   Mosie Lukes, MD  rivaroxaban (XARELTO) 10 MG TABS tablet Take 1 tablet (10 mg total) by mouth daily. 03/18/20   [name], MD  sodium chloride HYPERTONIC 3 % nebulizer solution Take by nebulization daily. 02/16/20   Rigoberto Noel, MD  sodium chloride, hypertonic, 3 % solution SMARTSIG:Via Nebulizer 02/16/20   [provider]  Spacer/Aero-Holding Chambers ([place]) MISC USE as directed. Use with inhaler 05/13/19   Mosie Lukes, MD  SSD 1 % cream APPLY TOPICALLY DAILY 03/31/19   Mosie Lukes, MD  SYRINGE-NEEDLE, DISP, 3 ML (B-D 3CC LUER-LOK SYR 25GX1") 25G X 1" 3 ML MISC 1 Syringe by Subdermal route once a week. 12/02/19   [provider]  traZODone (DESYREL) 100 MG tablet TAKE 1 TABLET BY MOUTH AT BEDTIME Patient taking differently: Take 100 mg by mouth at bedtime.  09/26/19   Mosie Lukes, MD  TUBERCULIN SYR 1CC/27GX1/2" (B-D TB SYRINGE 1CC/27GX1/2") 27G X 1/2" 1 ML MISC USE AS DIRECTED ONCE WEEKLY FOR PEG-INTERFERON INJECTIONS 09/09/19   [name], MD  venlafaxine XR (EFFEXOR-XR) 75 MG 24 hr capsule TAKE 1 CAPSULE BY MOUTH THREE TIMES DAILY Patient taking differently: Take 75 mg by mouth in the morning, at noon, and at bedtime.  09/26/19   Mosie Lukes, MD    Allergies    Patient has no known allergies.  Review of Systems   Review of Systems  Constitutional: Positive for fever.  Respiratory: Positive for cough and shortness of breath.   Gastrointestinal: Positive for vomiting.  Skin: Positive for wound.  All other systems reviewed and are negative.   Physical Exam Updated Vital Signs BP 117/71   Pulse 94   Temp 98.1 F (36.7 C) (Oral)   Resp 17   Ht 5\' 4"  (1.626 m)   Wt 64.4 kg   LMP 07/31/1990   SpO2 98%   BMI 24.37 kg/m     Physical Exam Vitals and nursing note reviewed.  Constitutional:      Appearance: She is well-developed.  HENT:     Head: Normocephalic and atraumatic.     Right Ear: External ear normal.     Left Ear: External ear normal.     Nose: Nose normal.  Eyes:     General:        Right eye: No discharge.        Left eye: No discharge.  Cardiovascular:     Rate and Rhythm: Regular rhythm. Tachycardia present.     Heart sounds: Normal heart sounds.  Pulmonary:     Effort: Pulmonary effort is normal. No respiratory distress.     Breath sounds: Normal breath sounds.  Abdominal:     Palpations: Abdomen is soft.     Tenderness: There is  no abdominal tenderness.  Musculoskeletal:     Comments: Deep sacral wound with drainage.  Right heel has what is likely early pressure wound, but no skin breakdown yet  Skin:    General: Skin is warm and dry.  Neurological:     Mental Status: She is alert.  Psychiatric:        Mood and Affect: Mood is not anxious.     ED Results / Procedures / Treatments   Labs (all labs ordered are listed, but only abnormal results are displayed) Labs Reviewed  COMPREHENSIVE METABOLIC PANEL - Abnormal; Notable for the following components:      Result Value   Potassium 2.4 (*)    Chloride 96 (*)    CO2 33 (*)    Glucose, Bld 103 (*)    BUN 30 (*)    Creatinine, Ser 1.06 (*)    Calcium 8.8 (*)    Albumin 2.5 (*)    AST 78 (*)    Alkaline Phosphatase 182 (*)    GFR calc non Af Amer 54 (*)    All other components within normal limits  CBC WITH DIFFERENTIAL/PLATELET - Abnormal; Notable for the following components:   WBC 13.4 (*)    RBC 2.92 (*)    Hemoglobin 9.3 (*)    HCT 31.0 (*)    MCV 106.2 (*)    Platelets 475 (*)    Neutro Abs 11.4 (*)    Abs Immature Granulocytes 0.13 (*)    All other components within normal limits  SARS CORONAVIRUS 2 BY RT PCR (HOSPITAL ORDER, [place] [facility])  CULTURE, BLOOD (ROUTINE X 2)  CULTURE,  BLOOD (ROUTINE X 2)  LACTIC ACID, PLASMA  MAGNESIUM  LACTIC ACID, PLASMA  URINALYSIS, ROUTINE W REFLEX MICROSCOPIC    EKG EKG Interpretation  Date/Time:  Friday April 16 2020 19:38:56 EDT Ventricular Rate:  109 PR Interval:    QRS Duration: 79 QT Interval:  346 QTC Calculation: 466 R Axis:   18 Text Interpretation: Sinus tachycardia Nonspecific T abnrm, anterolateral leads Confirmed by Sherwood Gambler [phone]) on 04/16/2020 8:03:31 PM   Radiology DG Chest 1 View  Result Date: 04/16/2020 CLINICAL DATA:  Sacral ulcer EXAM: CHEST  1 VIEW COMPARISON:  03/05/2020 FINDINGS: Scattered opacities within both lungs. No large consolidation. No pleural effusion or pneumothorax. Normal cardiomediastinal contours. IMPRESSION: Scattered opacities within both lungs, likely indicating infection. Electronically Signed   By: Ulyses Jarred M.D.   On: 04/16/2020 23:53   DG Pelvis 1-2 Views  Result Date: 04/16/2020 CLINICAL DATA:  Sacral ulcer EXAM: PELVIS - 1-2 VIEW COMPARISON:  None. FINDINGS: There is no evidence of pelvic fracture or diastasis. No pelvic bone lesions are seen. IMPRESSION: Negative. Electronically Signed   By: Ulyses Jarred M.D.   On: 04/16/2020 23:46   DG Sacrum/Coccyx  Result Date: 04/16/2020 CLINICAL DATA:  Sacral ulcer EXAM: SACRUM AND COCCYX - 2+ VIEW COMPARISON:  None. FINDINGS: There is subcutaneous gas that abuts the coccyx on the lateral projection. There is no osteolysis. There is nearby soft tissue ulceration. IMPRESSION: Subcutaneous gas abuts the coccyx on the lateral projection with nearby soft tissue ulceration. This likely indicates osseous exposure to the external environment. Electronically Signed   By: Ulyses Jarred M.D.   On: 04/16/2020 23:48   DG Foot Complete Right  Result Date: 04/16/2020 CLINICAL DATA:  Heel ulcer EXAM: RIGHT FOOT COMPLETE - 3+ VIEW COMPARISON:  None. FINDINGS: There is no evidence of fracture or  dislocation. There is no evidence of  arthropathy or other focal bone abnormality. Superficial ulceration at the heel IMPRESSION: Superficial ulceration at the heel. No radiographic evidence of osteomyelitis. Electronically Signed   By: Ulyses Jarred M.D.   On: 04/16/2020 23:49    Procedures .Critical Care Performed by: Sherwood Gambler, MD Authorized by: Sherwood Gambler, MD   Critical care provider statement:    Critical care time (minutes):  35   Critical care time was exclusive of:  Separately billable procedures and treating other patients   Critical care was necessary to treat or prevent imminent or life-threatening deterioration of the following conditions:  Sepsis and metabolic crisis   Critical care was time spent personally by me on the following activities:  Discussions with consultants, evaluation of patient's response to treatment, examination of patient, ordering and performing treatments and interventions, ordering and review of laboratory studies, ordering and review of radiographic studies, pulse oximetry, re-evaluation of patient's condition, obtaining history from patient or surrogate and review of old charts   (including critical care time)  Medications Ordered in ED Medications  vancomycin (VANCOREADY) IVPB 1250 mg/250 mL (has no administration in time range)  potassium chloride 10 mEq in 100 mL IVPB (has no administration in time range)  piperacillin-tazobactam (ZOSYN) IVPB 3.375 g (has no administration in time range)  sodium chloride 0.9 % bolus 1,000 mL (1,000 mLs Intravenous New Bag/Given 04/16/20 2113)  fentaNYL (SUBLIMAZE) injection 50 mcg (50 mcg Intravenous Given 04/16/20 2112)  piperacillin-tazobactam (ZOSYN) IVPB 3.375 g (0 g Intravenous Stopped 04/16/20 2151)  vancomycin (VANCOCIN) IVPB 1000 mg/200 mL premix (1,000 mg Intravenous New Bag/Given 04/16/20 2213)  potassium chloride SA (KLOR-CON) CR tablet 40 mEq (40 mEq Oral Given 04/16/20 2213)    ED Course  I have reviewed the triage vital signs and  the nursing notes.  Pertinent labs & imaging results that were available during my care of the patient were reviewed by me and considered in my medical decision making (see chart for details).    MDM Rules/Calculators/A&P                          Patient has a very deep sacral ulcer that I think will need better wound care.  Probably is also infected, especially with her leukocytosis.  A little bit of an acute kidney injury and significant hypokalemia.  She will need IV antibiotics, wound care and potassium replacement which has been started in the ED.  Fortunately her lactate is okay and no hypotension.  Discussed with hospitalist for admission. Final Clinical Impression(s) / ED Diagnoses Final diagnoses:  Pressure injury of sacral region, stage 4 ([place])  Hypokalemia    Rx / DC Orders ED Discharge Orders    None       Sherwood Gambler, MD 04/17/20 0008

## 2020-04-16 NOTE — ED Notes (Addendum)
Call from lab  Critical value   K 2.4  Dr Darnell Level notified

## 2020-04-16 NOTE — ED Notes (Signed)
Patient has had EKG done and seen by Dr Regenia Skeeter.

## 2020-04-16 NOTE — ED Notes (Addendum)
Call to [employer] re wounds, bedbug on pt upon arrival   Heavy wet diaper   POC: Rachael Johnson [facility] on call

## 2020-04-16 NOTE — ED Notes (Signed)
EMS reports bed bugs in the home   One bed bug was on pt upon arrival, observed by EMS and this nurse as well as [place] by EMS beyond recognition for preservation for pest control

## 2020-04-17 ENCOUNTER — Encounter (HOSPITAL_COMMUNITY): Payer: Self-pay | Admitting: Family Medicine

## 2020-04-17 ENCOUNTER — Inpatient Hospital Stay (HOSPITAL_COMMUNITY): Payer: Medicare Other

## 2020-04-17 DIAGNOSIS — G9340 Encephalopathy, unspecified: Secondary | ICD-10-CM | POA: Diagnosis not present

## 2020-04-17 DIAGNOSIS — M4628 Osteomyelitis of vertebra, sacral and sacrococcygeal region: Secondary | ICD-10-CM | POA: Diagnosis present

## 2020-04-17 DIAGNOSIS — J90 Pleural effusion, not elsewhere classified: Secondary | ICD-10-CM | POA: Diagnosis not present

## 2020-04-17 DIAGNOSIS — I82819 Embolism and thrombosis of superficial veins of unspecified lower extremities: Secondary | ICD-10-CM | POA: Diagnosis present

## 2020-04-17 DIAGNOSIS — I10 Essential (primary) hypertension: Secondary | ICD-10-CM | POA: Diagnosis not present

## 2020-04-17 DIAGNOSIS — Z23 Encounter for immunization: Secondary | ICD-10-CM | POA: Diagnosis not present

## 2020-04-17 DIAGNOSIS — I700 Atherosclerosis of aorta: Secondary | ICD-10-CM | POA: Diagnosis not present

## 2020-04-17 DIAGNOSIS — N3289 Other specified disorders of bladder: Secondary | ICD-10-CM | POA: Diagnosis not present

## 2020-04-17 DIAGNOSIS — L89154 Pressure ulcer of sacral region, stage 4: Secondary | ICD-10-CM | POA: Diagnosis present

## 2020-04-17 DIAGNOSIS — L89620 Pressure ulcer of left heel, unstageable: Secondary | ICD-10-CM | POA: Diagnosis present

## 2020-04-17 DIAGNOSIS — K5900 Constipation, unspecified: Secondary | ICD-10-CM | POA: Diagnosis present

## 2020-04-17 DIAGNOSIS — Z7189 Other specified counseling: Secondary | ICD-10-CM | POA: Diagnosis not present

## 2020-04-17 DIAGNOSIS — N179 Acute kidney failure, unspecified: Secondary | ICD-10-CM | POA: Diagnosis present

## 2020-04-17 DIAGNOSIS — Z20822 Contact with and (suspected) exposure to covid-19: Secondary | ICD-10-CM | POA: Diagnosis present

## 2020-04-17 DIAGNOSIS — I69354 Hemiplegia and hemiparesis following cerebral infarction affecting left non-dominant side: Secondary | ICD-10-CM | POA: Diagnosis not present

## 2020-04-17 DIAGNOSIS — R627 Adult failure to thrive: Secondary | ICD-10-CM | POA: Diagnosis present

## 2020-04-17 DIAGNOSIS — J449 Chronic obstructive pulmonary disease, unspecified: Secondary | ICD-10-CM | POA: Diagnosis not present

## 2020-04-17 DIAGNOSIS — R531 Weakness: Secondary | ICD-10-CM | POA: Diagnosis not present

## 2020-04-17 DIAGNOSIS — J479 Bronchiectasis, uncomplicated: Secondary | ICD-10-CM | POA: Diagnosis not present

## 2020-04-17 DIAGNOSIS — M6281 Muscle weakness (generalized): Secondary | ICD-10-CM | POA: Diagnosis present

## 2020-04-17 DIAGNOSIS — M869 Osteomyelitis, unspecified: Secondary | ICD-10-CM

## 2020-04-17 DIAGNOSIS — G9341 Metabolic encephalopathy: Secondary | ICD-10-CM | POA: Diagnosis present

## 2020-04-17 DIAGNOSIS — Z515 Encounter for palliative care: Secondary | ICD-10-CM | POA: Diagnosis not present

## 2020-04-17 DIAGNOSIS — M549 Dorsalgia, unspecified: Secondary | ICD-10-CM | POA: Diagnosis not present

## 2020-04-17 DIAGNOSIS — A419 Sepsis, unspecified organism: Secondary | ICD-10-CM | POA: Diagnosis present

## 2020-04-17 DIAGNOSIS — J470 Bronchiectasis with acute lower respiratory infection: Secondary | ICD-10-CM | POA: Diagnosis present

## 2020-04-17 DIAGNOSIS — M533 Sacrococcygeal disorders, not elsewhere classified: Secondary | ICD-10-CM | POA: Diagnosis not present

## 2020-04-17 DIAGNOSIS — E876 Hypokalemia: Secondary | ICD-10-CM | POA: Diagnosis present

## 2020-04-17 DIAGNOSIS — E43 Unspecified severe protein-calorie malnutrition: Secondary | ICD-10-CM | POA: Diagnosis present

## 2020-04-17 DIAGNOSIS — G8929 Other chronic pain: Secondary | ICD-10-CM | POA: Diagnosis not present

## 2020-04-17 DIAGNOSIS — Z7401 Bed confinement status: Secondary | ICD-10-CM | POA: Diagnosis not present

## 2020-04-17 DIAGNOSIS — D473 Essential (hemorrhagic) thrombocythemia: Secondary | ICD-10-CM | POA: Diagnosis present

## 2020-04-17 DIAGNOSIS — I6930 Unspecified sequelae of cerebral infarction: Secondary | ICD-10-CM | POA: Diagnosis not present

## 2020-04-17 DIAGNOSIS — R2681 Unsteadiness on feet: Secondary | ICD-10-CM | POA: Diagnosis present

## 2020-04-17 DIAGNOSIS — R330 Drug induced retention of urine: Secondary | ICD-10-CM | POA: Diagnosis not present

## 2020-04-17 DIAGNOSIS — R1032 Left lower quadrant pain: Secondary | ICD-10-CM | POA: Diagnosis not present

## 2020-04-17 DIAGNOSIS — R1314 Dysphagia, pharyngoesophageal phase: Secondary | ICD-10-CM | POA: Diagnosis present

## 2020-04-17 DIAGNOSIS — G3184 Mild cognitive impairment, so stated: Secondary | ICD-10-CM | POA: Diagnosis present

## 2020-04-17 DIAGNOSIS — E032 Hypothyroidism due to medicaments and other exogenous substances: Secondary | ICD-10-CM | POA: Diagnosis not present

## 2020-04-17 DIAGNOSIS — J189 Pneumonia, unspecified organism: Secondary | ICD-10-CM | POA: Diagnosis present

## 2020-04-17 DIAGNOSIS — E039 Hypothyroidism, unspecified: Secondary | ICD-10-CM | POA: Diagnosis present

## 2020-04-17 DIAGNOSIS — G9349 Other encephalopathy: Secondary | ICD-10-CM | POA: Diagnosis present

## 2020-04-17 DIAGNOSIS — R1312 Dysphagia, oropharyngeal phase: Secondary | ICD-10-CM | POA: Diagnosis present

## 2020-04-17 DIAGNOSIS — R509 Fever, unspecified: Secondary | ICD-10-CM | POA: Diagnosis not present

## 2020-04-17 DIAGNOSIS — K219 Gastro-esophageal reflux disease without esophagitis: Secondary | ICD-10-CM | POA: Diagnosis present

## 2020-04-17 DIAGNOSIS — T40605A Adverse effect of unspecified narcotics, initial encounter: Secondary | ICD-10-CM | POA: Diagnosis not present

## 2020-04-17 DIAGNOSIS — Z993 Dependence on wheelchair: Secondary | ICD-10-CM | POA: Diagnosis not present

## 2020-04-17 DIAGNOSIS — R000 Tachycardia, unspecified: Secondary | ICD-10-CM | POA: Diagnosis not present

## 2020-04-17 DIAGNOSIS — M6282 Rhabdomyolysis: Secondary | ICD-10-CM | POA: Diagnosis present

## 2020-04-17 LAB — CBC WITH DIFFERENTIAL/PLATELET
Abs Immature Granulocytes: 0.12 10*3/uL — ABNORMAL HIGH (ref 0.00–0.07)
Basophils Absolute: 0 10*3/uL (ref 0.0–0.1)
Basophils Relative: 0 %
Eosinophils Absolute: 0.2 10*3/uL (ref 0.0–0.5)
Eosinophils Relative: 2 %
HCT: 29.1 % — ABNORMAL LOW (ref 36.0–46.0)
Hemoglobin: 8.6 g/dL — ABNORMAL LOW (ref 12.0–15.0)
Immature Granulocytes: 1 %
Lymphocytes Relative: 8 %
Lymphs Abs: 0.9 10*3/uL (ref 0.7–4.0)
MCH: 31.6 pg (ref 26.0–34.0)
MCHC: 29.6 g/dL — ABNORMAL LOW (ref 30.0–36.0)
MCV: 107 fL — ABNORMAL HIGH (ref 80.0–100.0)
Monocytes Absolute: 0.6 10*3/uL (ref 0.1–1.0)
Monocytes Relative: 5 %
Neutro Abs: 10 10*3/uL — ABNORMAL HIGH (ref 1.7–7.7)
Neutrophils Relative %: 84 %
Platelets: 427 10*3/uL — ABNORMAL HIGH (ref 150–400)
RBC: 2.72 MIL/uL — ABNORMAL LOW (ref 3.87–5.11)
RDW: 15.3 % (ref 11.5–15.5)
WBC: 11.8 10*3/uL — ABNORMAL HIGH (ref 4.0–10.5)
nRBC: 0 % (ref 0.0–0.2)

## 2020-04-17 LAB — COMPREHENSIVE METABOLIC PANEL
ALT: 25 U/L (ref 0–44)
AST: 72 U/L — ABNORMAL HIGH (ref 15–41)
Albumin: 2.2 g/dL — ABNORMAL LOW (ref 3.5–5.0)
Alkaline Phosphatase: 156 U/L — ABNORMAL HIGH (ref 38–126)
Anion gap: 8 (ref 5–15)
BUN: 25 mg/dL — ABNORMAL HIGH (ref 8–23)
CO2: 31 mmol/L (ref 22–32)
Calcium: 8.2 mg/dL — ABNORMAL LOW (ref 8.9–10.3)
Chloride: 99 mmol/L (ref 98–111)
Creatinine, Ser: 0.82 mg/dL (ref 0.44–1.00)
GFR calc Af Amer: >60 mL/min (ref >60)
GFR calc non Af Amer: >60 mL/min (ref >60)
Glucose, Bld: 106 mg/dL — ABNORMAL HIGH (ref 70–99)
Potassium: 3.6 mmol/L (ref 3.5–5.1)
Sodium: 138 mmol/L (ref 135–145)
Total Bilirubin: 0.7 mg/dL (ref 0.3–1.2)
Total Protein: 6.4 g/dL — ABNORMAL LOW (ref 6.5–8.1)

## 2020-04-17 LAB — HIV ANTIBODY (ROUTINE TESTING W REFLEX): HIV Screen 4th Generation wRfx: NONREACTIVE

## 2020-04-17 LAB — PHOSPHORUS: Phosphorus: 2.8 mg/dL (ref 2.5–4.6)

## 2020-04-17 LAB — MAGNESIUM: Magnesium: 2 mg/dL (ref 1.7–2.4)

## 2020-04-17 LAB — LACTIC ACID, PLASMA: Lactic Acid, Venous: 2.1 mmol/L (ref 0.5–1.9)

## 2020-04-17 MED ORDER — ACETAMINOPHEN 650 MG RE SUPP: 650.0000 mg | Freq: Four times a day (QID) | RECTAL | Status: DC | PRN

## 2020-04-17 MED ORDER — POTASSIUM CHLORIDE 10 MEQ/100ML IV SOLN
10.0000 meq | INTRAVENOUS | Status: AC
  Administered 2020-04-17 (×4): 10 meq via INTRAVENOUS

## 2020-04-17 MED ORDER — OXYCODONE HCL 5 MG PO TABS
5.0000 mg | ORAL_TABLET | ORAL | Status: DC | PRN
  Administered 2020-04-18 – 2020-04-20 (×2): 5 mg via ORAL
  Filled 2020-04-17 (×2): qty 1

## 2020-04-17 MED ORDER — INFLUENZA VAC A&B SA ADJ QUAD 0.5 ML IM PRSY
0.5000 mL | PREFILLED_SYRINGE | INTRAMUSCULAR | Status: AC
  Administered 2020-04-19: 0.5 mL via INTRAMUSCULAR
  Filled 2020-04-17: qty 0.5

## 2020-04-17 MED ORDER — MOMETASONE FURO-FORMOTEROL FUM 100-5 MCG/ACT IN AERO
2.0000 | INHALATION_SPRAY | Freq: Two times a day (BID) | RESPIRATORY_TRACT | Status: DC
  Administered 2020-04-17 – 2020-04-21 (×7): 2 via RESPIRATORY_TRACT
  Filled 2020-04-17: qty 8.8

## 2020-04-17 MED ORDER — IOHEXOL 300 MG/ML  SOLN
100.0000 mL | Freq: Once | INTRAMUSCULAR | Status: AC | PRN
  Administered 2020-04-17: 100 mL via INTRAVENOUS

## 2020-04-17 MED ORDER — COLLAGENASE 250 UNIT/GM EX OINT
TOPICAL_OINTMENT | Freq: Two times a day (BID) | CUTANEOUS | Status: DC
  Filled 2020-04-17: qty 30

## 2020-04-17 MED ORDER — GABAPENTIN 400 MG PO CAPS
400.0000 mg | ORAL_CAPSULE | Freq: Every day | ORAL | Status: DC
  Administered 2020-04-17 – 2020-04-19 (×4): 400 mg via ORAL
  Filled 2020-04-17 (×5): qty 1

## 2020-04-17 MED ORDER — FLUTICASONE PROPIONATE 50 MCG/ACT NA SUSP
1.0000 | Freq: Every day | NASAL | Status: DC
  Administered 2020-04-17 – 2020-04-21 (×5): 1 via NASAL
  Filled 2020-04-17 (×3): qty 16

## 2020-04-17 MED ORDER — ENSURE ENLIVE PO LIQD
237.0000 mL | Freq: Two times a day (BID) | ORAL | Status: DC
  Administered 2020-04-17 – 2020-04-19 (×5): 237 mL via ORAL

## 2020-04-17 MED ORDER — OCUVITE-LUTEIN PO CAPS
1.0000 | ORAL_CAPSULE | Freq: Every day | ORAL | Status: DC
  Administered 2020-04-18 – 2020-04-19 (×2): 1 via ORAL
  Filled 2020-04-17 (×3): qty 1

## 2020-04-17 MED ORDER — ALPRAZOLAM 1 MG PO TABS
1.0000 mg | ORAL_TABLET | Freq: Three times a day (TID) | ORAL | Status: DC | PRN
  Administered 2020-04-17 (×2): 1 mg via ORAL
  Filled 2020-04-17 (×2): qty 1

## 2020-04-17 MED ORDER — ACETAMINOPHEN 325 MG PO TABS: 650.0000 mg | ORAL_TABLET | Freq: Four times a day (QID) | ORAL | Status: DC | PRN

## 2020-04-17 MED ORDER — RIVAROXABAN 10 MG PO TABS
10.0000 mg | ORAL_TABLET | Freq: Every day | ORAL | Status: DC
  Administered 2020-04-17 – 2020-04-21 (×5): 10 mg via ORAL
  Filled 2020-04-17 (×5): qty 1

## 2020-04-17 MED ORDER — CHLORHEXIDINE GLUCONATE CLOTH 2 % EX PADS
6.0000 | MEDICATED_PAD | Freq: Every day | CUTANEOUS | Status: DC
  Administered 2020-04-17 – 2020-04-21 (×4): 6 via TOPICAL

## 2020-04-17 MED ORDER — ATORVASTATIN CALCIUM 10 MG PO TABS
10.0000 mg | ORAL_TABLET | Freq: Every day | ORAL | Status: DC
  Administered 2020-04-17 – 2020-04-21 (×5): 10 mg via ORAL
  Filled 2020-04-17 (×5): qty 1

## 2020-04-17 MED ORDER — SORBITOL 70 % SOLN
960.0000 mL | TOPICAL_OIL | Freq: Once | ORAL | Status: AC
  Administered 2020-04-17: 960 mL via RECTAL
  Filled 2020-04-17: qty 473

## 2020-04-17 MED ORDER — ONDANSETRON HCL 4 MG PO TABS: 4.0000 mg | ORAL_TABLET | Freq: Four times a day (QID) | ORAL | Status: DC | PRN

## 2020-04-17 MED ORDER — VENLAFAXINE HCL ER 75 MG PO CP24
75.0000 mg | ORAL_CAPSULE | Freq: Every day | ORAL | Status: DC
  Administered 2020-04-17 – 2020-04-21 (×5): 75 mg via ORAL
  Filled 2020-04-17 (×5): qty 1

## 2020-04-17 MED ORDER — IOHEXOL 9 MG/ML PO SOLN
ORAL | Status: AC
  Administered 2020-04-17: 500 mL
  Filled 2020-04-17: qty 1000

## 2020-04-17 MED ORDER — BUSPIRONE HCL 5 MG PO TABS
15.0000 mg | ORAL_TABLET | Freq: Two times a day (BID) | ORAL | Status: DC
  Administered 2020-04-17 – 2020-04-21 (×9): 15 mg via ORAL
  Filled 2020-04-17 (×10): qty 3

## 2020-04-17 MED ORDER — MONTELUKAST SODIUM 10 MG PO TABS
10.0000 mg | ORAL_TABLET | Freq: Every day | ORAL | Status: DC
  Administered 2020-04-17 – 2020-04-19 (×4): 10 mg via ORAL
  Filled 2020-04-17 (×5): qty 1

## 2020-04-17 MED ORDER — ONDANSETRON HCL 4 MG/2ML IJ SOLN: 4.0000 mg | Freq: Four times a day (QID) | INTRAMUSCULAR | Status: DC | PRN

## 2020-04-17 MED ORDER — MORPHINE SULFATE (PF) 2 MG/ML IV SOLN
2.0000 mg | INTRAVENOUS | Status: DC | PRN
  Administered 2020-04-17 (×3): 2 mg via INTRAVENOUS
  Filled 2020-04-17 (×3): qty 1

## 2020-04-17 MED ORDER — FAMOTIDINE 20 MG PO TABS
20.0000 mg | ORAL_TABLET | Freq: Two times a day (BID) | ORAL | Status: DC
  Administered 2020-04-17 – 2020-04-21 (×9): 20 mg via ORAL
  Filled 2020-04-17 (×10): qty 1

## 2020-04-17 MED ORDER — SODIUM CHLORIDE 0.9 % IV SOLN: INTRAVENOUS | Status: DC

## 2020-04-17 MED ORDER — POLYETHYLENE GLYCOL 3350 17 G PO PACK: 17.0000 g | PACK | Freq: Every day | ORAL | Status: DC | PRN

## 2020-04-17 MED ORDER — LEVOTHYROXINE SODIUM 100 MCG PO TABS
100.0000 ug | ORAL_TABLET | Freq: Every day | ORAL | Status: DC
  Administered 2020-04-17 – 2020-04-21 (×5): 100 ug via ORAL
  Filled 2020-04-17 (×5): qty 1

## 2020-04-17 MED ORDER — MAGIC MOUTHWASH W/LIDOCAINE: 15.0000 mL | Freq: Three times a day (TID) | ORAL | Status: DC | PRN

## 2020-04-17 MED ORDER — TRAZODONE HCL 50 MG PO TABS
100.0000 mg | ORAL_TABLET | Freq: Every day | ORAL | Status: DC
  Administered 2020-04-17 – 2020-04-19 (×4): 100 mg via ORAL
  Filled 2020-04-17 (×5): qty 2

## 2020-04-17 MED ORDER — OXYCODONE-ACETAMINOPHEN 10-325 MG PO TABS: 1.0000 | ORAL_TABLET | Freq: Four times a day (QID) | ORAL | Status: DC | PRN

## 2020-04-17 MED ORDER — ALBUTEROL SULFATE HFA 108 (90 BASE) MCG/ACT IN AERS: 2.0000 | INHALATION_SPRAY | RESPIRATORY_TRACT | Status: DC | PRN

## 2020-04-17 NOTE — Consult Note (Addendum)
[facility] Consult  Reason for Consult: Sacral decubitus ulcer  Referring Physician: Dr.Memon   Chief Complaint    Wound Check      HPI: Rachael Johnson is a 69 y.o. female with recent PNA, COPD, some degree of change in cognition and reported possible CVA with left sided weakness. The patient has been at home with her daughter and [facility]. I spoke with her son who is reported POA Event organiser, and he says that Rachael Johnson had a double pneumonia and sepsis about ~1 month ago and has gone down hill from there. He says that her cognition has not been th same. He was told that Rachael Johnson had CVA at [facility] but then was told by another doctor they did not see documentation that indicated a CVA. Rachael Johnson has been weak and bed bound since being home. Her daughter has been caring for her with a [facility] RN and taking photos of the sacral wound for them per Joey's report. He is upset bc the ED was talking about adult protective services and they have been doing everything they can for their mom.  Per his report before these events Rachael Johnson was active and independent and this is a real change.   Past Medical History:  Diagnosis Date  . Acute renal insufficiency 04/09/2017  . Anemia, iron deficiency 07/08/2012  . Arthritis   . Cataract 12/21/2014  . COPD (chronic obstructive pulmonary disease) ([place]) 01/23/2017   01/23/2017   Walked RA  2 laps @ 185 ft each stopped due to  Cough > > sob/ no desat nl pace  . Depression with anxiety 03/24/2011  . Diverticulitis   . Esophageal reflux 08/17/2013  . Essential thrombocythemia ([place]) 01/24/2011  . Frequent episodic tension-type headache   . Gastroenteritis 12/21/2014  . 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 GERD (gastroesophageal reflux disease)   . H. pylori infection 12/19/2012  . Hyperglycemia 12/17/2015  . Hypertension   . Iron deficiency anemia due to chronic blood loss 04/03/2017  . Osteoarthritis 05/24/2017  . Other and unspecified hyperlipidemia 02/25/2013  . Restless leg  syndrome 09/30/2014  . Rhabdomyolysis 02/2017  . Scabies 03/28/2015  . 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 Seizure ([place])    childhood  . Shoulder wound, right, sequela 03/14/2017  . Thyroid disease     Past Surgical History:  Procedure Laterality Date  . ABDOMINAL HYSTERECTOMY     1992  . BREAST BIOPSY    . BREAST CYST ASPIRATION    . BREAST SURGERY  1992   biopsy, benign. Fibrocystic  . UMBILICAL HERNIA REPAIR N/A 09/25/2012   Procedure: HERNIA REPAIR UMBILICAL ADULT;  Surgeon: Harl Bowie, MD;  Location: [facility];  Service: General;  Laterality: N/A;    Family History  Problem Relation Age of Onset  . Arthritis Mother   . Cancer Mother        ovarian  . Hypertension Mother   . Heart disease Mother        pacer  . Heart failure Mother   . Arthritis Father   . Cancer Father        lung  . Hypertension Father   . Cancer Sister        lung  . Cancer Brother        prostate  . Cancer Brother        lung  . Hypertension Son   . COPD Brother   . Heart disease Brother        died  from CHF  . Hypertension Brother   . Cancer Brother        colon  . Alcohol abuse Brother   . Cirrhosis Brother   . Seizures Sister   . Stroke Sister   . Arthritis Brother     Social History   Tobacco Use  . Smoking status: Former Smoker    Packs/day: 3.50    Years: 20.00    Pack years: 70.00    Types: Cigarettes    Start date: 11/15/1970    Quit date: 07/31/1990    Years since quitting: 29.7  . Smokeless tobacco: Never Used  . Tobacco comment: quit 23 years ago  Vaping Use  . Vaping Use: Never used  Substance Use Topics  . Alcohol use: No    Alcohol/week: 0.0 standard drinks  . Drug use: No    Medications: I have reviewed the patient's current medications. Current Facility-Administered Medications  Medication Dose Route Frequency Provider Last Rate Last Admin  . 0.9 %  sodium chloride infusion   Intravenous Continuous Zierle-Ghosh, Asia B, DO 100 mL/hr at 04/17/20  0314 Rate Verify at 04/17/20 0314  . acetaminophen (TYLENOL) tablet 650 mg  650 mg Oral Q6H PRN Zierle-Ghosh, Asia B, DO       Or  . acetaminophen (TYLENOL) suppository 650 mg  650 mg Rectal Q6H PRN Zierle-Ghosh, Asia B, DO      . albuterol (VENTOLIN HFA) 108 (90 Base) MCG/ACT inhaler 2 puff  2 puff Inhalation Q4H PRN Zierle-Ghosh, Asia B, DO      . ALPRAZolam (XANAX) tablet 1 mg  1 mg Oral TID PRN Zierle-Ghosh, Asia B, DO   1 mg at 04/17/20 0221  . atorvastatin (LIPITOR) tablet 10 mg  10 mg Oral Daily Zierle-Ghosh, Asia B, DO   10 mg at 04/17/20 1122  . busPIRone (BUSPAR) tablet 15 mg  15 mg Oral BID Zierle-Ghosh, Asia B, DO   15 mg at 04/17/20 1122  . collagenase (SANTYL) ointment   Topical BID Virl Cagey, MD      . famotidine (PEPCID) tablet 20 mg  20 mg Oral BID Zierle-Ghosh, Asia B, DO   20 mg at 04/17/20 1123  . feeding supplement (ENSURE ENLIVE) (ENSURE ENLIVE) liquid 237 mL  237 mL Oral BID BM Zierle-Ghosh, Asia B, DO   237 mL at 04/17/20 1123  . fluticasone (FLONASE) 50 MCG/ACT nasal spray 1 spray  1 spray Each Nare Daily Zierle-Ghosh, Asia B, DO   1 spray at 04/17/20 1123  . gabapentin (NEURONTIN) capsule 400 mg  400 mg Oral QHS Zierle-Ghosh, Asia B, DO   400 mg at 04/17/20 0225  . [START ON 04/18/2020] influenza vaccine adjuvanted (FLUAD) injection 0.5 mL  0.5 mL Intramuscular Tomorrow-1000 Kathie Dike, MD      . levothyroxine (SYNTHROID) tablet 100 mcg  100 mcg Oral Q0600 Zierle-Ghosh, Asia B, DO   100 mcg at 04/17/20 0402  . magic mouthwash w/lidocaine  15 mL Oral TID PRN Zierle-Ghosh, Asia B, DO      . mometasone-formoterol (DULERA) 100-5 MCG/ACT inhaler 2 puff  2 puff Inhalation BID Zierle-Ghosh, Asia B, DO      . montelukast (SINGULAIR) tablet 10 mg  10 mg Oral QHS Zierle-Ghosh, Asia B, DO   10 mg at 04/17/20 0220  . morphine 2 MG/ML injection 2 mg  2 mg Intravenous Q2H PRN Zierle-Ghosh, Asia B, DO   2 mg at 04/17/20 0220  . ondansetron (ZOFRAN) tablet 4 mg  4 mg  Oral  Q6H PRN Zierle-Ghosh, Asia B, DO       Or  . ondansetron (ZOFRAN) injection 4 mg  4 mg Intravenous Q6H PRN Zierle-Ghosh, Asia B, DO      . oxyCODONE (Oxy IR/ROXICODONE) immediate release tablet 5 mg  5 mg Oral Q4H PRN Zierle-Ghosh, Asia B, DO      . piperacillin-tazobactam (ZOSYN) IVPB 3.375 g  3.375 g Intravenous Q8H Zierle-Ghosh, Asia B, DO 12.5 mL/hr at 04/17/20 0605 3.375 g at 04/17/20 0605  . polyethylene glycol (MIRALAX / GLYCOLAX) packet 17 g  17 g Oral Daily PRN Zierle-Ghosh, Asia B, DO      . rivaroxaban (XARELTO) tablet 10 mg  10 mg Oral Daily Zierle-Ghosh, Asia B, DO   10 mg at 04/17/20 1123  . traZODone (DESYREL) tablet 100 mg  100 mg Oral QHS Zierle-Ghosh, Asia B, DO   100 mg at 04/17/20 0222  . vancomycin (VANCOREADY) IVPB 1250 mg/250 mL  1,250 mg Intravenous Q24H Zierle-Ghosh, Asia B, DO      . venlafaxine XR (EFFEXOR-XR) 24 hr capsule 75 mg  75 mg Oral Q breakfast Zierle-Ghosh, Asia B, DO   75 mg at 04/17/20 1122    No Known Allergies   ROS:  Review of systems not obtained due to patient factors.  Blood pressure (!) 147/68, pulse 99, temperature 98.2 F (36.8 C), temperature source Oral, resp. rate 20, height 5\' 4"  (1.626 m), weight 64.4 kg, last menstrual period 07/31/1990, SpO2 96 %. Physical Exam Vitals reviewed.  Constitutional:      General: Rachael Johnson is not in acute distress.    Appearance: Rachael Johnson is not toxic-appearing.  HENT:     Head: Normocephalic.     Nose: Nose normal.  Cardiovascular:     Rate and Rhythm: Normal rate.  Pulmonary:     Effort: Pulmonary effort is normal.  Abdominal:     Palpations: Abdomen is soft.  Musculoskeletal:        General: No swelling.     Comments: Sacral wound with eschar and necrotic fibrinous material, some drainage, measuring about 10cmX5cm X4cm deep down to bone, overlying eschar unroofed with sharp scissor debridement total area 6X3cm  Left heel with blister, fluid filled, minor erythema immediately around about 2 mm, no  drainage, unstageable   Skin:    General: Skin is warm.  Neurological:     Mental Status: Rachael Johnson is disoriented.     Results: Results for orders placed or performed during the hospital encounter of 04/16/20 (from the past 48 hour(s))  SARS Coronavirus 2 by RT PCR (hospital order, performed in [facility] hospital lab) Nasopharyngeal Nasopharyngeal Swab     Status: None   Collection Time: 04/16/20  7:39 PM   Specimen: Nasopharyngeal Swab  Result Value Ref Range   SARS Coronavirus 2 NEGATIVE NEGATIVE    Comment: (NOTE) SARS-CoV-2 target nucleic acids are NOT DETECTED.  The SARS-CoV-2 RNA is generally detectable in upper and lower respiratory specimens during the acute phase of infection. The lowest concentration of SARS-CoV-2 viral copies this assay can detect is 250 copies / mL. A negative result does not preclude SARS-CoV-2 infection and should not be used as the sole basis for treatment or other patient management decisions.  A negative result may occur with improper specimen collection / handling, submission of specimen other than nasopharyngeal swab, presence of viral mutation(s) within the areas targeted by this assay, and inadequate number of viral copies (<250 copies / mL). A negative result must be  combined with clinical observations, patient history, and epidemiological information.  Fact Sheet for Patients:   StrictlyIdeas.no  Fact Sheet for Healthcare Providers: BankingDealers.co.za  This test is not yet approved or  cleared by the [place] FDA and has been authorized for detection and/or diagnosis of SARS-CoV-2 by FDA under an Emergency Use Authorization (EUA).  This EUA will remain in effect (meaning this test can be used) for the duration of the COVID-19 declaration under Section 564(b)(1) of the Act, 21 U.S.C. section 360bbb-3(b)(1), unless the authorization is terminated or revoked sooner.  Performed at [facility], [address], [place], [place] [address]   Magnesium     Status: None   Collection Time: 04/16/20  7:48 PM  Result Value Ref Range   Magnesium 2.1 1.7 - 2.4 mg/dL    Comment: Performed at [facility], [address] [place], [place] [address]  Comprehensive metabolic panel     Status: Abnormal   Collection Time: 04/16/20  8:38 PM  Result Value Ref Range   Sodium 139 135 - 145 mmol/L   Potassium 2.4 (LL) 3.5 - 5.1 mmol/L    Comment: CRITICAL RESULT CALLED TO, READ BACK BY AND VERIFIED WITH: TUTTLE,A ON 04/16/20 AT 2140 BY LOY,C    Chloride 96 (L) 98 - 111 mmol/L   CO2 33 (H) 22 - 32 mmol/L   Glucose, Bld 103 (H) 70 - 99 mg/dL    Comment: Glucose reference range applies only to samples taken after fasting for at least 8 hours.   BUN 30 (H) 8 - 23 mg/dL   Creatinine, Ser 1.06 (H) 0.44 - 1.00 mg/dL   Calcium 8.8 (L) 8.9 - 10.3 mg/dL   Total Protein 7.2 6.5 - 8.1 g/dL   Albumin 2.5 (L) 3.5 - 5.0 g/dL   AST 78 (H) 15 - 41 U/L   ALT 27 0 - 44 U/L   Alkaline Phosphatase 182 (H) 38 - 126 U/L   Total Bilirubin 1.1 0.3 - 1.2 mg/dL   GFR calc non Af Amer 54 (L) >60 mL/min   GFR calc Af Amer >60 >60 mL/min   Anion gap 10 5 - 15    Comment: Performed at [facility], [address] [place], [place] [address]  CBC with Differential     Status: Abnormal   Collection Time: 04/16/20  8:38 PM  Result Value Ref Range   WBC 13.4 (H) 4.0 - 10.5 K/uL   RBC 2.92 (L) 3.87 - 5.11 MIL/uL   Hemoglobin 9.3 (L) 12.0 - 15.0 g/dL   HCT 31.0 (L) 36 - 46 %   MCV 106.2 (H) 80.0 - 100.0 fL   MCH 31.8 26.0 - 34.0 pg   MCHC 30.0 30.0 - 36.0 g/dL   RDW 15.3 11.5 - 15.5 %   Platelets 475 (H) 150 - 400 K/uL   nRBC 0.0 0.0 - 0.2 %   Neutrophils Relative % 85 %   Neutro Abs 11.4 (H) 1.7 - 7.7 K/uL   Lymphocytes Relative 7 %   Lymphs Abs 1.0 0.7 - 4.0 K/uL   Monocytes Relative 6 %   Monocytes Absolute 0.8 0 - 1 K/uL   Eosinophils Relative 1 %   Eosinophils Absolute 0.1 0 - 0 K/uL   Basophils  Relative 0 %   Basophils Absolute 0.0 0 - 0 K/uL   Immature Granulocytes 1 %   Abs Immature Granulocytes 0.13 (H) 0.00 - 0.07 K/uL    Comment: Performed at [facility],  [address], [place], [place] [address]  Lactic acid, plasma     Status: None   Collection Time: 04/16/20  9:16 PM  Result Value Ref Range   Lactic Acid, Venous 1.2 0.5 - 1.9 mmol/L    Comment: Performed at [facility], [address] [place], [place] [address]  Culture, blood (routine x 2)     Status: None (Preliminary result)   Collection Time: 04/16/20  9:16 PM   Specimen: Right Antecubital; Blood  Result Value Ref Range   Specimen Description RIGHT ANTECUBITAL    Special Requests      BOTTLES DRAWN AEROBIC AND ANAEROBIC Blood Culture adequate volume   Culture      NO GROWTH < 12 HOURS Performed at [facility], [address], [place], [place] [address]    Report Status PENDING   Culture, blood (routine x 2)     Status: None (Preliminary result)   Collection Time: 04/16/20  9:25 PM   Specimen: BLOOD RIGHT HAND  Result Value Ref Range   Specimen Description BLOOD RIGHT HAND    Special Requests      BOTTLES DRAWN AEROBIC AND ANAEROBIC Blood Culture adequate volume   Culture      NO GROWTH < 12 HOURS Performed at [facility], [address] [place], [place] [address]    Report Status PENDING   Lactic acid, plasma     Status: Abnormal   Collection Time: 04/17/20 12:13 AM  Result Value Ref Range   Lactic Acid, Venous 2.1 (HH) 0.5 - 1.9 mmol/L    Comment: CRITICAL RESULT CALLED TO, READ BACK BY AND VERIFIED WITH: EASTER,T @ 0050 ON 04/17/20 BY JUW Performed at [facility], [address], [place], [place] [address]   Comprehensive metabolic panel     Status: Abnormal   Collection Time: 04/17/20  6:30 AM  Result Value Ref Range   Sodium 138 135 - 145 mmol/L   Potassium 3.6 3.5 - 5.1 mmol/L    Comment: DELTA CHECK NOTED   Chloride 99 98 - 111 mmol/L   CO2 31 22 - 32 mmol/L   Glucose, Bld 106 (H) 70 -  99 mg/dL    Comment: Glucose reference range applies only to samples taken after fasting for at least 8 hours.   BUN 25 (H) 8 - 23 mg/dL   Creatinine, Ser 0.82 0.44 - 1.00 mg/dL   Calcium 8.2 (L) 8.9 - 10.3 mg/dL   Total Protein 6.4 (L) 6.5 - 8.1 g/dL   Albumin 2.2 (L) 3.5 - 5.0 g/dL   AST 72 (H) 15 - 41 U/L   ALT 25 0 - 44 U/L   Alkaline Phosphatase 156 (H) 38 - 126 U/L   Total Bilirubin 0.7 0.3 - 1.2 mg/dL   GFR calc non Af Amer >60 >60 mL/min   GFR calc Af Amer >60 >60 mL/min   Anion gap 8 5 - 15    Comment: Performed at [facility], [address] [place], [place] [address]  Magnesium     Status: None   Collection Time: 04/17/20  6:30 AM  Result Value Ref Range   Magnesium 2.0 1.7 - 2.4 mg/dL    Comment: Performed at [facility], [address], [place],  [address]  Phosphorus     Status: None   Collection Time: 04/17/20  6:30 AM  Result Value Ref Range   Phosphorus 2.8 2.5 - 4.6 mg/dL    Comment: Performed at [facility], [address],  [place], [place] [address]  CBC WITH DIFFERENTIAL     Status: Abnormal   Collection Time: 04/17/20  6:30 AM  Result Value Ref Range   WBC 11.8 (H) 4.0 - 10.5 K/uL   RBC 2.72 (L) 3.87 - 5.11 MIL/uL   Hemoglobin 8.6 (L) 12.0 - 15.0 g/dL   HCT 29.1 (L) 36 - 46 %   MCV 107.0 (H) 80.0 - 100.0 fL   MCH 31.6 26.0 - 34.0 pg   MCHC 29.6 (L) 30.0 - 36.0 g/dL   RDW 15.3 11.5 - 15.5 %   Platelets 427 (H) 150 - 400 K/uL   nRBC 0.0 0.0 - 0.2 %   Neutrophils Relative % 84 %   Neutro Abs 10.0 (H) 1.7 - 7.7 K/uL   Lymphocytes Relative 8 %   Lymphs Abs 0.9 0.7 - 4.0 K/uL   Monocytes Relative 5 %   Monocytes Absolute 0.6 0 - 1 K/uL   Eosinophils Relative 2 %   Eosinophils Absolute 0.2 0 - 0 K/uL   Basophils Relative 0 %   Basophils Absolute 0.0 0 - 0 K/uL   Immature Granulocytes 1 %   Abs Immature Granulocytes 0.12 (H) 0.00 - 0.07 K/uL    Comment: Performed at [facility], [address], [place], [place] [address]   *Note: Due to a  large number of results and/or encounters for the requested time period, some results have not been displayed. A complete set of results can be found in Results Review.   Personally reviewed imaging- sacral decubitus and left heel ulccer  DG Chest 1 View  Result Date: 04/16/2020 CLINICAL DATA:  Sacral ulcer EXAM: CHEST  1 VIEW COMPARISON:  03/05/2020 FINDINGS: Scattered opacities within both lungs. No large consolidation. No pleural effusion or pneumothorax. Normal cardiomediastinal contours. IMPRESSION: Scattered opacities within both lungs, likely indicating infection. Electronically Signed   By: Ulyses Jarred M.D.   On: 04/16/2020 23:53   DG Pelvis 1-2 Views  Result Date: 04/16/2020 CLINICAL DATA:  Sacral ulcer EXAM: PELVIS - 1-2 VIEW COMPARISON:  None. FINDINGS: There is no evidence of pelvic fracture or diastasis. No pelvic bone lesions are seen. IMPRESSION: Negative. Electronically Signed   By: Ulyses Jarred M.D.   On: 04/16/2020 23:46   DG Sacrum/Coccyx  Result Date: 04/16/2020 CLINICAL DATA:  Sacral ulcer EXAM: SACRUM AND COCCYX - 2+ VIEW COMPARISON:  None. FINDINGS: There is subcutaneous gas that abuts the coccyx on the lateral projection. There is no osteolysis. There is nearby soft tissue ulceration. IMPRESSION: Subcutaneous gas abuts the coccyx on the lateral projection with nearby soft tissue ulceration. This likely indicates osseous exposure to the external environment. Electronically Signed   By: Ulyses Jarred M.D.   On: 04/16/2020 23:48   CT ABDOMEN PELVIS W CONTRAST  Result Date: 04/17/2020 CLINICAL DATA:  69 year old female with a history of left lower quadrant pain, concerning for intra-abdominal abscess EXAM: CT ABDOMEN AND PELVIS WITH CONTRAST TECHNIQUE: Multidetector CT imaging of the abdomen and pelvis was performed using the standard protocol following bolus administration of intravenous contrast. CONTRAST:  182mL OMNIPAQUE IOHEXOL 300 MG/ML  SOLN COMPARISON:  07/10/2018  FINDINGS: Lower chest: Atelectasis/scarring at the bilateral lung bases. No acute finding at the lung bases. Hepatobiliary: Diffusely decreased attenuation of liver parenchyma. Unremarkable gallbladder. Pancreas: Unremarkable Spleen: Unremarkable Adrenals/Urinary Tract: - Right adrenal gland:  Unremarkable - Left adrenal gland: Unremarkable. - Right kidney: No hydronephrosis, nephrolithiasis, inflammation, or ureteral dilation. Low-density lesion on the lateral cortex of the right kidney,  unchanged from the prior, compatible with a benign cyst. - Left Kidney: No hydronephrosis, nephrolithiasis, inflammation, or ureteral dilation. No focal lesion. - Urinary Bladder: Urinary bladder distended. Stomach/Bowel: - Stomach: Unremarkable. - Small bowel: Unremarkable.  Enteric contrast reaches distal bowel. - Appendix: Normal appendix - Colon: Moderate two large formed stool burden. No focal inflammatory changes. No evidence of transition point. Vascular/Lymphatic: Minimal atherosclerotic changes of the abdominal aorta. Unremarkable portal venous system. Unremarkable systemic venous system. Reproductive: Hysterectomy. Other: Incidental imaging of the left arm demonstrates extravasation of contrast within the medial soft tissues, between the muscular compartment in the superficial soft tissues. Edema/inflammatory changes of the left upper extremity in this region, which is incompletely imaged. Musculoskeletal: Soft tissue defect within the sacral region overlying the lower sacrum and the coccyx. The depth appears to extend to the cortex/periosteum. No abscess present within the superficial soft tissues. No intra-abdominal abscess identified. IMPRESSION: CT is negative for intra-abdominal abscess. No finding that would account for left lower quadrant pain. Moderate to large formed stool burden. Recommend correlation with any history of constipation. No evidence of bowel obstruction. Steatosis. Sacral decubitus wound,  compatible with given history. Distention of the urinary bladder, perhaps incidental. Extravasation of contrast within the medial left upper arm. Correlation with physical exam and history of known contrast extravasation may be useful. Additional ancillary findings as above. Electronically Signed   By: Corrie Mckusick D.O.   On: 04/17/2020 11:14   DG Foot Complete Right  Result Date: 04/16/2020 CLINICAL DATA:  Heel ulcer EXAM: RIGHT FOOT COMPLETE - 3+ VIEW COMPARISON:  None. FINDINGS: There is no evidence of fracture or dislocation. There is no evidence of arthropathy or other focal bone abnormality. Superficial ulceration at the heel IMPRESSION: Superficial ulceration at the heel. No radiographic evidence of osteomyelitis. Electronically Signed   By: Ulyses Jarred M.D.   On: 04/16/2020 23:49   Procedure: Sacral decubitus excisional debridement Pre procedure diagnosis: Stage IV decubitus ulcer sacrum Post procedure diagnosis: Same  Description: Using sharp excisional debridement with scissors, I debrided skin and necrotic tissue from the wound to unroof the eschar, total measurement 6X3cm of debridement. Wound measuring 10X5X4cm with bone exposed at base.   Assessment & Plan:  KRISSA UTKE is a 69 y.o. 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 Rachael Johnson has been at home after recent PNA and sepsis and bed bound.   Bedside debridement done, but minimal given Xarelto, minimal bleeding noted. Santyl and BID dressing changes with saline gauze and ABD or sacral pad  Heel off load with Prevalon boots, would like the blister intact  All questions were answered to the satisfaction of the patient and family.     Virl Cagey 04/17/2020, 1:02 PM

## 2020-04-17 NOTE — ED Notes (Signed)
Date and time results received: 04/17/20 0050 (use smartphrase ".now" to insert current time)  Test: Lactic Critical Value: 2.1  Name of Provider Notified: Clearence Ped, MD  Orders Received? Or Actions Taken?:

## 2020-04-17 NOTE — Consult Note (Addendum)
[facility] Consult  Reason for Consult: Sacral decubitus ulcer  Referring Physician: Dr.Memon   Chief Complaint    Wound Check      HPI: Rachael Johnson is a 69 y.o. female with recent PNA, COPD, some degree of change in cognition and reported possible CVA with left sided weakness. The patient has been at home with her daughter and [facility]. I spoke with her son who is reported POA Event organiser, and he says that she had a double pneumonia and sepsis about ~1 month ago and has gone down hill from there. He says that her cognition has not been th same. He was told that she had CVA at [facility] but then was told by another doctor they did not see documentation that indicated a CVA. She has been weak and bed bound since being home. Her daughter has been caring for her with a [facility] RN and taking photos of the sacral wound for them per Joey's report. He is upset bc the ED was talking about adult protective services and they have been doing everything they can for their mom.  Per his report before these events she was active and independent and this is a real change.   Past Medical History:  Diagnosis Date  . Acute renal insufficiency 04/09/2017  . Anemia, iron deficiency 07/08/2012  . Arthritis   . Cataract 12/21/2014  . COPD (chronic obstructive pulmonary disease) ([place]) 01/23/2017   01/23/2017   Walked RA  2 laps @ 185 ft each stopped due to  Cough > > sob/ no desat nl pace  . Depression with anxiety 03/24/2011  . Diverticulitis   . Esophageal reflux 08/17/2013  . Essential thrombocythemia ([place]) 01/24/2011  . Frequent episodic tension-type headache   . Gastroenteritis 12/21/2014  . 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 GERD (gastroesophageal reflux disease)   . H. pylori infection 12/19/2012  . Hyperglycemia 12/17/2015  . Hypertension   . Iron deficiency anemia due to chronic blood loss 04/03/2017  . Osteoarthritis 05/24/2017  . Other and unspecified hyperlipidemia 02/25/2013  . Restless leg  syndrome 09/30/2014  . Rhabdomyolysis 02/2017  . Scabies 03/28/2015  . 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 Seizure ([place])    childhood  . Shoulder wound, right, sequela 03/14/2017  . Thyroid disease     Past Surgical History:  Procedure Laterality Date  . ABDOMINAL HYSTERECTOMY     1992  . BREAST BIOPSY    . BREAST CYST ASPIRATION    . BREAST SURGERY  1992   biopsy, benign. Fibrocystic  . UMBILICAL HERNIA REPAIR N/A 09/25/2012   Procedure: HERNIA REPAIR UMBILICAL ADULT;  Surgeon: Harl Bowie, MD;  Location: [facility];  Service: General;  Laterality: N/A;    Family History  Problem Relation Age of Onset  . Arthritis Mother   . Cancer Mother        ovarian  . Hypertension Mother   . Heart disease Mother        pacer  . Heart failure Mother   . Arthritis Father   . Cancer Father        lung  . Hypertension Father   . Cancer Sister        lung  . Cancer Brother        prostate  . Cancer Brother        lung  . Hypertension Son   . COPD Brother   . Heart disease Brother        died  from CHF  . Hypertension Brother   . Cancer Brother        colon  . Alcohol abuse Brother   . Cirrhosis Brother   . Seizures Sister   . Stroke Sister   . Arthritis Brother     Social History   Tobacco Use  . Smoking status: Former Smoker    Packs/day: 3.50    Years: 20.00    Pack years: 70.00    Types: Cigarettes    Start date: 11/15/1970    Quit date: 07/31/1990    Years since quitting: 29.7  . Smokeless tobacco: Never Used  . Tobacco comment: quit 23 years ago  Vaping Use  . Vaping Use: Never used  Substance Use Topics  . Alcohol use: No    Alcohol/week: 0.0 standard drinks  . Drug use: No    Medications: I have reviewed the patient's current medications. Current Facility-Administered Medications  Medication Dose Route Frequency Provider Last Rate Last Admin  . 0.9 %  sodium chloride infusion   Intravenous Continuous Zierle-Ghosh, Asia B, DO 100 mL/hr at 04/17/20  0314 Rate Verify at 04/17/20 0314  . acetaminophen (TYLENOL) tablet 650 mg  650 mg Oral Q6H PRN Zierle-Ghosh, Asia B, DO       Or  . acetaminophen (TYLENOL) suppository 650 mg  650 mg Rectal Q6H PRN Zierle-Ghosh, Asia B, DO      . albuterol (VENTOLIN HFA) 108 (90 Base) MCG/ACT inhaler 2 puff  2 puff Inhalation Q4H PRN Zierle-Ghosh, Asia B, DO      . ALPRAZolam (XANAX) tablet 1 mg  1 mg Oral TID PRN Zierle-Ghosh, Asia B, DO   1 mg at 04/17/20 0221  . atorvastatin (LIPITOR) tablet 10 mg  10 mg Oral Daily Zierle-Ghosh, Asia B, DO   10 mg at 04/17/20 1122  . busPIRone (BUSPAR) tablet 15 mg  15 mg Oral BID Zierle-Ghosh, Asia B, DO   15 mg at 04/17/20 1122  . collagenase (SANTYL) ointment   Topical BID Virl Cagey, MD      . famotidine (PEPCID) tablet 20 mg  20 mg Oral BID Zierle-Ghosh, Asia B, DO   20 mg at 04/17/20 1123  . feeding supplement (ENSURE ENLIVE) (ENSURE ENLIVE) liquid 237 mL  237 mL Oral BID BM Zierle-Ghosh, Asia B, DO   237 mL at 04/17/20 1123  . fluticasone (FLONASE) 50 MCG/ACT nasal spray 1 spray  1 spray Each Nare Daily Zierle-Ghosh, Asia B, DO   1 spray at 04/17/20 1123  . gabapentin (NEURONTIN) capsule 400 mg  400 mg Oral QHS Zierle-Ghosh, Asia B, DO   400 mg at 04/17/20 0225  . [START ON 04/18/2020] influenza vaccine adjuvanted (FLUAD) injection 0.5 mL  0.5 mL Intramuscular Tomorrow-1000 Kathie Dike, MD      . levothyroxine (SYNTHROID) tablet 100 mcg  100 mcg Oral Q0600 Zierle-Ghosh, Asia B, DO   100 mcg at 04/17/20 0402  . magic mouthwash w/lidocaine  15 mL Oral TID PRN Zierle-Ghosh, Asia B, DO      . mometasone-formoterol (DULERA) 100-5 MCG/ACT inhaler 2 puff  2 puff Inhalation BID Zierle-Ghosh, Asia B, DO      . montelukast (SINGULAIR) tablet 10 mg  10 mg Oral QHS Zierle-Ghosh, Asia B, DO   10 mg at 04/17/20 0220  . morphine 2 MG/ML injection 2 mg  2 mg Intravenous Q2H PRN Zierle-Ghosh, Asia B, DO   2 mg at 04/17/20 0220  . ondansetron (ZOFRAN) tablet 4 mg  4 mg  Oral  Q6H PRN Zierle-Ghosh, Asia B, DO       Or  . ondansetron (ZOFRAN) injection 4 mg  4 mg Intravenous Q6H PRN Zierle-Ghosh, Asia B, DO      . oxyCODONE (Oxy IR/ROXICODONE) immediate release tablet 5 mg  5 mg Oral Q4H PRN Zierle-Ghosh, Asia B, DO      . piperacillin-tazobactam (ZOSYN) IVPB 3.375 g  3.375 g Intravenous Q8H Zierle-Ghosh, Asia B, DO 12.5 mL/hr at 04/17/20 0605 3.375 g at 04/17/20 0605  . polyethylene glycol (MIRALAX / GLYCOLAX) packet 17 g  17 g Oral Daily PRN Zierle-Ghosh, Asia B, DO      . rivaroxaban (XARELTO) tablet 10 mg  10 mg Oral Daily Zierle-Ghosh, Asia B, DO   10 mg at 04/17/20 1123  . traZODone (DESYREL) tablet 100 mg  100 mg Oral QHS Zierle-Ghosh, Asia B, DO   100 mg at 04/17/20 0222  . vancomycin (VANCOREADY) IVPB 1250 mg/250 mL  1,250 mg Intravenous Q24H Zierle-Ghosh, Asia B, DO      . venlafaxine XR (EFFEXOR-XR) 24 hr capsule 75 mg  75 mg Oral Q breakfast Zierle-Ghosh, Asia B, DO   75 mg at 04/17/20 1122    No Known Allergies   ROS:  Review of systems not obtained due to patient factors.  Blood pressure (!) 147/68, pulse 99, temperature 98.2 F (36.8 C), temperature source Oral, resp. rate 20, height 5\' 4"  (1.626 m), weight 64.4 kg, last menstrual period 07/31/1990, SpO2 96 %. Physical Exam Vitals reviewed.  Constitutional:      General: She is not in acute distress.    Appearance: Normal appearance. She is not toxic-appearing.  HENT:     Head: Normocephalic.     Nose: Nose normal.  Cardiovascular:     Rate and Rhythm: Normal rate.  Pulmonary:     Effort: Pulmonary effort is normal.  Abdominal:     Palpations: Abdomen is soft.  Musculoskeletal:        General: No swelling.     Comments: Sacral wound with eschar and necrotic fibrinous material, some drainage, measuring about 10cmX5cm X4cm deep down to bone, overlying eschar unroofed with sharp scissor debridement total area 6X3cm  Left heel with blister, fluid filled, minor erythema immediately around  about 2 mm, no drainage, unstageable   Skin:    General: Skin is warm.  Neurological:     Mental Status: She is disoriented.    Pre-debridement:    Post debridement:   Left Heel:     Results: Results for orders placed or performed during the hospital encounter of 04/16/20 (from the past 48 hour(s))  SARS Coronavirus 2 by RT PCR (hospital order, performed in [facility] hospital lab) Nasopharyngeal Nasopharyngeal Swab     Status: None   Collection Time: 04/16/20  7:39 PM   Specimen: Nasopharyngeal Swab  Result Value Ref Range   SARS Coronavirus 2 NEGATIVE NEGATIVE    Comment: (NOTE) SARS-CoV-2 target nucleic acids are NOT DETECTED.  The SARS-CoV-2 RNA is generally detectable in upper and lower respiratory specimens during the acute phase of infection. The lowest concentration of SARS-CoV-2 viral copies this assay can detect is 250 copies / mL. A negative result does not preclude SARS-CoV-2 infection and should not be used as the sole basis for treatment or other patient management decisions.  A negative result may occur with improper specimen collection / handling, submission of specimen other than nasopharyngeal swab, presence of viral mutation(s) within the areas targeted by this assay,  and inadequate number of viral copies (<250 copies / mL). A negative result must be combined with clinical observations, patient history, and epidemiological information.  Fact Sheet for Patients:   StrictlyIdeas.no  Fact Sheet for Healthcare Providers: BankingDealers.co.za  This test is not yet approved or  cleared by the [place] FDA and has been authorized for detection and/or diagnosis of SARS-CoV-2 by FDA under an Emergency Use Authorization (EUA).  This EUA will remain in effect (meaning this test can be used) for the duration of the COVID-19 declaration under Section 564(b)(1) of the Act, 21 U.S.C. section 360bbb-3(b)(1),  unless the authorization is terminated or revoked sooner.  Performed at [facility], [address], [place], [place] [address]   Magnesium     Status: None   Collection Time: 04/16/20  7:48 PM  Result Value Ref Range   Magnesium 2.1 1.7 - 2.4 mg/dL    Comment: Performed at [facility], [address] [place], [place] [address]  Comprehensive metabolic panel     Status: Abnormal   Collection Time: 04/16/20  8:38 PM  Result Value Ref Range   Sodium 139 135 - 145 mmol/L   Potassium 2.4 (LL) 3.5 - 5.1 mmol/L    Comment: CRITICAL RESULT CALLED TO, READ BACK BY AND VERIFIED WITH: TUTTLE,A ON 04/16/20 AT 2140 BY LOY,C    Chloride 96 (L) 98 - 111 mmol/L   CO2 33 (H) 22 - 32 mmol/L   Glucose, Bld 103 (H) 70 - 99 mg/dL    Comment: Glucose reference range applies only to samples taken after fasting for at least 8 hours.   BUN 30 (H) 8 - 23 mg/dL   Creatinine, Ser 1.06 (H) 0.44 - 1.00 mg/dL   Calcium 8.8 (L) 8.9 - 10.3 mg/dL   Total Protein 7.2 6.5 - 8.1 g/dL   Albumin 2.5 (L) 3.5 - 5.0 g/dL   AST 78 (H) 15 - 41 U/L   ALT 27 0 - 44 U/L   Alkaline Phosphatase 182 (H) 38 - 126 U/L   Total Bilirubin 1.1 0.3 - 1.2 mg/dL   GFR calc non Af Amer 54 (L) >60 mL/min   GFR calc Af Amer >60 >60 mL/min   Anion gap 10 5 - 15    Comment: Performed at [facility], [address] [place], [place] [address]  CBC with Differential     Status: Abnormal   Collection Time: 04/16/20  8:38 PM  Result Value Ref Range   WBC 13.4 (H) 4.0 - 10.5 K/uL   RBC 2.92 (L) 3.87 - 5.11 MIL/uL   Hemoglobin 9.3 (L) 12.0 - 15.0 g/dL   HCT 31.0 (L) 36 - 46 %   MCV 106.2 (H) 80.0 - 100.0 fL   MCH 31.8 26.0 - 34.0 pg   MCHC 30.0 30.0 - 36.0 g/dL   RDW 15.3 11.5 - 15.5 %   Platelets 475 (H) 150 - 400 K/uL   nRBC 0.0 0.0 - 0.2 %   Neutrophils Relative % 85 %   Neutro Abs 11.4 (H) 1.7 - 7.7 K/uL   Lymphocytes Relative 7 %   Lymphs Abs 1.0 0.7 - 4.0 K/uL   Monocytes Relative 6 %   Monocytes Absolute 0.8 0 - 1 K/uL    Eosinophils Relative 1 %   Eosinophils Absolute 0.1 0 - 0 K/uL   Basophils Relative 0 %   Basophils Absolute 0.0 0 - 0 K/uL   Immature Granulocytes 1 %   Abs Immature Granulocytes  0.13 (H) 0.00 - 0.07 K/uL    Comment: Performed at [facility], [address] [place], [place] [address]  Lactic acid, plasma     Status: None   Collection Time: 04/16/20  9:16 PM  Result Value Ref Range   Lactic Acid, Venous 1.2 0.5 - 1.9 mmol/L    Comment: Performed at [facility], [address], [place], [place] [address]  Culture, blood (routine x 2)     Status: None (Preliminary result)   Collection Time: 04/16/20  9:16 PM   Specimen: Right Antecubital; Blood  Result Value Ref Range   Specimen Description RIGHT ANTECUBITAL    Special Requests      BOTTLES DRAWN AEROBIC AND ANAEROBIC Blood Culture adequate volume   Culture      NO GROWTH < 12 HOURS Performed at [facility], [address], [place], [place] [address]    Report Status PENDING   Culture, blood (routine x 2)     Status: None (Preliminary result)   Collection Time: 04/16/20  9:25 PM   Specimen: BLOOD RIGHT HAND  Result Value Ref Range   Specimen Description BLOOD RIGHT HAND    Special Requests      BOTTLES DRAWN AEROBIC AND ANAEROBIC Blood Culture adequate volume   Culture      NO GROWTH < 12 HOURS Performed at [facility], [address] [place], [place] [address]    Report Status PENDING   Lactic acid, plasma     Status: Abnormal   Collection Time: 04/17/20 12:13 AM  Result Value Ref Range   Lactic Acid, Venous 2.1 (HH) 0.5 - 1.9 mmol/L    Comment: CRITICAL RESULT CALLED TO, READ BACK BY AND VERIFIED WITH: EASTER,T @ 0050 ON 04/17/20 BY JUW Performed at [facility], [address], [place], [place] [address]   Comprehensive metabolic panel     Status: Abnormal   Collection Time: 04/17/20  6:30 AM  Result Value Ref Range   Sodium 138 135 - 145 mmol/L   Potassium 3.6 3.5 - 5.1 mmol/L    Comment: DELTA CHECK NOTED    Chloride 99 98 - 111 mmol/L   CO2 31 22 - 32 mmol/L   Glucose, Bld 106 (H) 70 - 99 mg/dL    Comment: Glucose reference range applies only to samples taken after fasting for at least 8 hours.   BUN 25 (H) 8 - 23 mg/dL   Creatinine, Ser 0.82 0.44 - 1.00 mg/dL   Calcium 8.2 (L) 8.9 - 10.3 mg/dL   Total Protein 6.4 (L) 6.5 - 8.1 g/dL   Albumin 2.2 (L) 3.5 - 5.0 g/dL   AST 72 (H) 15 - 41 U/L   ALT 25 0 - 44 U/L   Alkaline Phosphatase 156 (H) 38 - 126 U/L   Total Bilirubin 0.7 0.3 - 1.2 mg/dL   GFR calc non Af Amer >60 >60 mL/min   GFR calc Af Amer >60 >60 mL/min   Anion gap 8 5 - 15    Comment: Performed at [facility], [address], [place], [place] [address]  Magnesium     Status: None   Collection Time: 04/17/20  6:30 AM  Result Value Ref Range   Magnesium 2.0 1.7 - 2.4 mg/dL    Comment: Performed at [facility], [address] [place], [place] [address]  Phosphorus     Status: None   Collection Time: 04/17/20  6:30 AM  Result Value Ref Range   Phosphorus 2.8 2.5 -  4.6 mg/dL    Comment: Performed at [facility], [address] [place], [place] [address]  CBC WITH DIFFERENTIAL     Status: Abnormal   Collection Time: 04/17/20  6:30 AM  Result Value Ref Range   WBC 11.8 (H) 4.0 - 10.5 K/uL   RBC 2.72 (L) 3.87 - 5.11 MIL/uL   Hemoglobin 8.6 (L) 12.0 - 15.0 g/dL   HCT 29.1 (L) 36 - 46 %   MCV 107.0 (H) 80.0 - 100.0 fL   MCH 31.6 26.0 - 34.0 pg   MCHC 29.6 (L) 30.0 - 36.0 g/dL   RDW 15.3 11.5 - 15.5 %   Platelets 427 (H) 150 - 400 K/uL   nRBC 0.0 0.0 - 0.2 %   Neutrophils Relative % 84 %   Neutro Abs 10.0 (H) 1.7 - 7.7 K/uL   Lymphocytes Relative 8 %   Lymphs Abs 0.9 0.7 - 4.0 K/uL   Monocytes Relative 5 %   Monocytes Absolute 0.6 0 - 1 K/uL   Eosinophils Relative 2 %   Eosinophils Absolute 0.2 0 - 0 K/uL   Basophils Relative 0 %   Basophils Absolute 0.0 0 - 0 K/uL   Immature Granulocytes 1 %   Abs Immature Granulocytes 0.12 (H) 0.00 - 0.07 K/uL    Comment: Performed  at [facility], [address], [place], [place] [address]   *Note: Due to a large number of results and/or encounters for the requested time period, some results have not been displayed. A complete set of results can be found in Results Review.   Personally reviewed imaging- sacral decubitus and left heel ulccer  DG Chest 1 View  Result Date: 04/16/2020 CLINICAL DATA:  Sacral ulcer EXAM: CHEST  1 VIEW COMPARISON:  03/05/2020 FINDINGS: Scattered opacities within both lungs. No large consolidation. No pleural effusion or pneumothorax. Normal cardiomediastinal contours. IMPRESSION: Scattered opacities within both lungs, likely indicating infection. Electronically Signed   By: Ulyses Jarred M.D.   On: 04/16/2020 23:53   DG Pelvis 1-2 Views  Result Date: 04/16/2020 CLINICAL DATA:  Sacral ulcer EXAM: PELVIS - 1-2 VIEW COMPARISON:  None. FINDINGS: There is no evidence of pelvic fracture or diastasis. No pelvic bone lesions are seen. IMPRESSION: Negative. Electronically Signed   By: Ulyses Jarred M.D.   On: 04/16/2020 23:46   DG Sacrum/Coccyx  Result Date: 04/16/2020 CLINICAL DATA:  Sacral ulcer EXAM: SACRUM AND COCCYX - 2+ VIEW COMPARISON:  None. FINDINGS: There is subcutaneous gas that abuts the coccyx on the lateral projection. There is no osteolysis. There is nearby soft tissue ulceration. IMPRESSION: Subcutaneous gas abuts the coccyx on the lateral projection with nearby soft tissue ulceration. This likely indicates osseous exposure to the external environment. Electronically Signed   By: Ulyses Jarred M.D.   On: 04/16/2020 23:48   CT ABDOMEN PELVIS W CONTRAST  Result Date: 04/17/2020 CLINICAL DATA:  69 year old female with a history of left lower quadrant pain, concerning for intra-abdominal abscess EXAM: CT ABDOMEN AND PELVIS WITH CONTRAST TECHNIQUE: Multidetector CT imaging of the abdomen and pelvis was performed using the standard protocol following bolus administration of intravenous contrast.  CONTRAST:  157mL OMNIPAQUE IOHEXOL 300 MG/ML  SOLN COMPARISON:  07/10/2018 FINDINGS: Lower chest: Atelectasis/scarring at the bilateral lung bases. No acute finding at the lung bases. Hepatobiliary: Diffusely decreased attenuation of liver parenchyma. Unremarkable gallbladder. Pancreas: Unremarkable Spleen: Unremarkable Adrenals/Urinary Tract: - Right adrenal gland:  Unremarkable - Left adrenal gland: Unremarkable. - Right kidney: No hydronephrosis, nephrolithiasis,  inflammation, or ureteral dilation. Low-density lesion on the lateral cortex of the right kidney, unchanged from the prior, compatible with a benign cyst. - Left Kidney: No hydronephrosis, nephrolithiasis, inflammation, or ureteral dilation. No focal lesion. - Urinary Bladder: Urinary bladder distended. Stomach/Bowel: - Stomach: Unremarkable. - Small bowel: Unremarkable.  Enteric contrast reaches distal bowel. - Appendix: Normal appendix - Colon: Moderate two large formed stool burden. No focal inflammatory changes. No evidence of transition point. Vascular/Lymphatic: Minimal atherosclerotic changes of the abdominal aorta. Unremarkable portal venous system. Unremarkable systemic venous system. Reproductive: Hysterectomy. Other: Incidental imaging of the left arm demonstrates extravasation of contrast within the medial soft tissues, between the muscular compartment in the superficial soft tissues. Edema/inflammatory changes of the left upper extremity in this region, which is incompletely imaged. Musculoskeletal: Soft tissue defect within the sacral region overlying the lower sacrum and the coccyx. The depth appears to extend to the cortex/periosteum. No abscess present within the superficial soft tissues. No intra-abdominal abscess identified. IMPRESSION: CT is negative for intra-abdominal abscess. No finding that would account for left lower quadrant pain. Moderate to large formed stool burden. Recommend correlation with any history of constipation. No  evidence of bowel obstruction. Steatosis. Sacral decubitus wound, compatible with given history. Distention of the urinary bladder, perhaps incidental. Extravasation of contrast within the medial left upper arm. Correlation with physical exam and history of known contrast extravasation may be useful. Additional ancillary findings as above. Electronically Signed   By: Corrie Mckusick D.O.   On: 04/17/2020 11:14   DG Foot Complete Right  Result Date: 04/16/2020 CLINICAL DATA:  Heel ulcer EXAM: RIGHT FOOT COMPLETE - 3+ VIEW COMPARISON:  None. FINDINGS: There is no evidence of fracture or dislocation. There is no evidence of arthropathy or other focal bone abnormality. Superficial ulceration at the heel IMPRESSION: Superficial ulceration at the heel. No radiographic evidence of osteomyelitis. Electronically Signed   By: Ulyses Jarred M.D.   On: 04/16/2020 23:49   Procedure: Sacral decubitus excisional debridement Pre procedure diagnosis: Stage IV decubitus ulcer sacrum Post procedure diagnosis: Same  Description: Using sharp excisional debridement with scissors, I debrided skin and necrotic tissue from the wound to unroof the eschar, total measurement 6X3cm of debridement. Wound measuring 10X5X4cm with bone exposed at base.   Assessment & Plan:  MALARY AYLESWORTH is a 69 y.o. 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 She has been at home after recent PNA and sepsis and bed bound.   Bedside debridement done, but minimal given Xarelto, minimal bleeding noted. Santyl and BID dressing changes with saline gauze and ABD or sacral pad  Heel off load with Prevalon boots, would like the blister intact Defer to Hospitalist/ ID regarding osteo treatment as this is going to be a chronic wound/ chronic infection/ inflammation    All questions were answered to the satisfaction of the patient and family.     Virl Cagey 04/17/2020, 1:24 PM

## 2020-04-17 NOTE — H&P (Signed)
TRH H&P    Patient Demographics:    Rachael Johnson, is a 69 y.o. female  MRN: [id number]  DOB - August 19, 1950  Admit Date - 04/16/2020  Referring MD/NP/PA: Regenia Skeeter  Outpatient Primary MD for the patient is Mosie Lukes, MD  Patient coming from: Home   Chief complaint- Painful ulcer   HPI:    Rachael Johnson  is a 69 y.o. female, with history of CVA with residual left sided weakness, thyroid disease, seizure, rhabdo, restless leg syndrome, GERD, COPD, and more presents to the ED with a chief complaint of painful ulcer. Patient reports that the ulcer started 2 months ago. She says that she has not had any previous treatments for it. It started out small, and has worsened in size and pain over the two months. She reports that propping herself up with a pillow makes the pain better. Palpating or laying supine make it worse. Patient reports that since her stroke she has only been able to ambulate with a wheelchair. She is not sure when, but the wound has started draining brown, malodorous fluid. Patient reports that she has been feverish with Tmax 102. She has not taken any antipyretics. Patient reports that she has had a cough that also started two months ago. The cough has been improving recently. The cough has been non productive. She reports that she has had dyspnea, but has not had to use more than her 4LNC that is her baseline. She reports no chest pain, palpitations, or peripheral edema. She reports a normal appetite, and normal water intake. Patient does report painful mouth and blisters. She does use steroid inhalers at home.   Patient is oriented to person and place, but not to time. She does know the president.   In the ED T 98.1, P 82, R 17, BP 117/72 WBC 13.4 K+ 2.4 Alk phos 182, albumin 2.5 Blood cultures pending CXR = scattered opacities w/in both lungs XR foot = superficial ulceration of heel. No  osteo XR pelvis = negative XR sacrum - indicative of osteo     Review of systems:    In addition to the HPI above,  Review of Systems  Constitutional: Positive for chills, diaphoresis, fever and malaise/fatigue.  HENT: Negative for congestion, ear pain, sore throat and tinnitus.   Eyes: Negative for pain and discharge.  Respiratory: Positive for cough and shortness of breath. Negative for hemoptysis, sputum production and wheezing.   Cardiovascular: Negative for chest pain, palpitations, orthopnea and leg swelling.  Gastrointestinal: Positive for constipation. Negative for abdominal pain, diarrhea, heartburn, nausea and vomiting.  Genitourinary: Negative for dysuria and urgency.  Musculoskeletal: Positive for back pain.  Skin: Negative for rash.  Neurological: Positive for focal weakness (left sided). Negative for weakness.  Psychiatric/Behavioral: Negative for substance abuse.    All other systems reviewed and are negative.    Past History of the following :    Past Medical History:  Diagnosis Date  . Acute renal insufficiency 04/09/2017  . Anemia, iron deficiency 07/08/2012  . Arthritis   . Cataract  12/21/2014  . COPD (chronic obstructive pulmonary disease) ([place]) 01/23/2017   01/23/2017   Walked RA  2 laps @ 185 ft each stopped due to  Cough > > sob/ no desat nl pace  . Depression with anxiety 03/24/2011  . Diverticulitis   . Esophageal reflux 08/17/2013  . Essential thrombocythemia ([place]) 01/24/2011  . Frequent episodic tension-type headache   . Gastroenteritis 12/21/2014  . 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 GERD (gastroesophageal reflux disease)   . H. pylori infection 12/19/2012  . Hyperglycemia 12/17/2015  . Hypertension   . Iron deficiency anemia due to chronic blood loss 04/03/2017  . Osteoarthritis 05/24/2017  . Other and unspecified hyperlipidemia 02/25/2013  . Restless leg syndrome 09/30/2014  . Rhabdomyolysis 02/2017  . Scabies 03/28/2015  . 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 Seizure ([place])    childhood  . Shoulder wound, right, sequela 03/14/2017  . Thyroid disease       Past Surgical History:  Procedure Laterality Date  . ABDOMINAL HYSTERECTOMY     1992  . BREAST BIOPSY    . BREAST CYST ASPIRATION    . BREAST SURGERY  1992   biopsy, benign. Fibrocystic  . UMBILICAL HERNIA REPAIR N/A 09/25/2012   Procedure: HERNIA REPAIR UMBILICAL ADULT;  Surgeon: Harl Bowie, MD;  Location: [facility];  Service: General;  Laterality: N/A;      Social History:      Social History   Tobacco Use  . Smoking status: Former Smoker    Packs/day: 3.50    Years: 20.00    Pack years: 70.00    Types: Cigarettes    Start date: 11/15/1970    Quit date: 07/31/1990    Years since quitting: 29.7  . Smokeless tobacco: Never Used  . Tobacco comment: quit 23 years ago  Substance Use Topics  . Alcohol use: No    Alcohol/week: 0.0 standard drinks       Family History :     Family History  Problem Relation Age of Onset  . Arthritis Mother   . Cancer Mother        ovarian  . Hypertension Mother   . Heart disease Mother        pacer  . Heart failure Mother   . Arthritis Father   . Cancer Father        lung  . Hypertension Father   . Cancer Sister        lung  . Cancer Brother        prostate  . Cancer Brother        lung  . Hypertension Son   . COPD Brother   . Heart disease Brother        died from CHF  . Hypertension Brother   . Cancer Brother        colon  . Alcohol abuse Brother   . Cirrhosis Brother   . Seizures Sister   . Stroke Sister   . Arthritis Brother       Home Medications:   Prior to Admission medications   Medication Sig Start Date End Date Taking? Authorizing Provider  albuterol (VENTOLIN HFA) 108 (90 Base) MCG/ACT inhaler INHALE 2 PUFFS BY MOUTH EVERY 6 HOURS AS NEEDED FOR WHEEZING OR SHORTNESS OF BREATH 06/09/19   Mosie Lukes, MD  ALLER-CHLOR 4 MG tablet TAKE 2 TABLETS BY MOUTH EVERY NIGHT AT BEDTIME AS NEEDED  FOR ALLERGIES Patient taking differently: Take 2 mg by mouth at bedtime as  needed for allergies.  02/13/20   Mosie Lukes, MD  ALPRAZolam Duanne Moron) 1 MG tablet TAKE 1 TABLET BY MOUTH THREE TIMES DAILY AS NEEDED FOR ANXIETY Patient taking differently: Take 1 mg by mouth 3 (three) times daily as needed for anxiety.  02/13/20   Mosie Lukes, MD  alum & mag hydroxide-simeth (GELUSIL) 200-200-25 MG chewable tablet Chew by mouth. 04/08/19   [provider]  atorvastatin (LIPITOR) 10 MG tablet Take 1 tablet (10 mg total) by mouth daily. 05/30/19   Mosie Lukes, MD  B-D 3CC LUER-LOK SYR 25GX1" 25G X 1" 3 ML MISC Inject into the skin. 02/23/20   [provider]  budesonide-formoterol (SYMBICORT) 80-4.5 MCG/ACT inhaler Inhale 2 puffs into the lungs 2 (two) times daily. 06/09/19   Mosie Lukes, MD  busPIRone (BUSPAR) 15 MG tablet TAKE (1) TABLET BY MOUTH FOUR TIMES A DAY AS NEEDED Patient taking differently: Take 15 mg by mouth 4 (four) times daily as needed.  12/12/19   Mosie Lukes, MD  Cranberry 300 MG tablet Take 300 mg by mouth 2 (two) times daily.    [provider]  cyanocobalamin (,VITAMIN B-12,) 1000 MCG/ML injection Inject 1 ml into muscle q week x 4 then q monthly Patient taking differently: Inject 1,000 mcg into the muscle every 30 (thirty) days.  10/31/19   [name], MD  cyclobenzaprine (FLEXERIL) 10 MG tablet Take 1 tablet (10 mg total) by mouth 2 (two) times daily as needed for muscle spasms. 01/30/20   Mosie Lukes, MD  diphenhydrAMINE (EQ ALLERGY RELIEF CHILDRENS) 12.5 MG/5ML liquid TAKE 5 ML BY MOUTH THREE TIMES DAILY AS NEEDED FOR MOUTH  PAIN 10/10/19   Mosie Lukes, MD  estradiol (ESTRACE) 0.5 MG tablet TAKE ONE (1) TABLET BY MOUTH TWICE DAILY Patient taking differently: Take 0.5 mg by mouth in the morning and at bedtime.  01/12/20   Mosie Lukes, MD  famotidine (PEPCID) 20 MG tablet Take 20 mg by mouth 2 (two) times daily. 03/04/20   [provider]  famotidine (PEPCID) 40 MG tablet Take 1 tablet (40 mg total) by mouth daily. Patient not taking: Reported on 04/16/2020 05/30/19   Mosie Lukes, MD  fluconazole (DIFLUCAN) 150 MG tablet TAKE (1) TABLET BY MOUTH ONCE WEEKLY Patient taking differently: Take 150 mg by mouth once a week.  12/23/19   Mosie Lukes, MD  fluticasone (FLONASE) 50 MCG/ACT nasal spray INSTILL TWO (2) SPRAYS IN EACH NOSTRIL ONCE DAILY 08/21/19   Mosie Lukes, MD  furosemide (LASIX) 20 MG tablet TAKE 1 TABLET BY MOUTH 3 TIMES DAILY AS NEEDED FOR EDEMA Patient taking differently: Take 20 mg by mouth 3 (three) times daily as needed for fluid.  09/26/19   Mosie Lukes, MD  gabapentin (NEURONTIN) 100 MG capsule TAKE FOUR (4) CAPSULES BY MOUTH EVERY NIGHT AT BEDTIME Patient taking differently: Take 400 mg by mouth at bedtime.  09/26/19   Mosie Lukes, MD  gabapentin (NEURONTIN) 400 MG capsule Take 1 capsule (400 mg total) by mouth daily. Patient not taking: Reported on 04/16/2020 05/30/19   Mosie Lukes, MD  granisetron (SANCUSO) 3.1 MG/24HR APPLY 1 PATCH TOPICALLY AS NEEDED FOR NAUSEA, REMOVE AFTER 7 DAYS Patient taking differently: Place 1 patch onto the skin every 7 (seven) days.  10/08/19   [name], MD  levothyroxine (SYNTHROID) 100 MCG tablet TAKE 1 TABLET BY MOUTH EVERY MORNING BEFORE BREAKFAST Patient taking differently: Take  100 mcg by mouth daily before breakfast.  01/12/20   Mosie Lukes, MD  magic mouthwash SOLN Take 5 mLs by mouth 3 (three) times daily as needed for mouth pain. Components benadryl  525 mg, hydrocortisone 60 mg and nystatin 0.6 mg. 240 ml - Oral 04/08/19   Ennever, Rudell Cobb, MD  meclizine (ANTIVERT) 12.5 MG tablet TAKE 1 TABLET BY MOUTH THREE TIMES DAILY AS NEEDED FOR DIZZINESS Patient taking differently: Take 12.5 mg by mouth 3 (three) times daily as needed for dizziness.  09/26/19   Mosie Lukes, MD  montelukast (SINGULAIR) 10 MG tablet TAKE 1 TABLET BY MOUTH AT  BEDTIME Patient taking differently: Take 10 mg by mouth at bedtime.  12/04/19   Mosie Lukes, MD  Multiple Vitamin (MULTIVITAMIN PO) Take 1 tablet by mouth every morning.     [provider]  Nebulizers MISC USE AS DIRECTED 02/16/20   [provider]  nystatin (MYCOSTATIN) 100000 UNIT/ML suspension Take 5 mLs (500,000 Units total) by mouth 4 (four) times daily. 06/13/19   Mosie Lukes, MD  ondansetron (ZOFRAN) 8 MG tablet TAKE 1/2-1 TABLET BY MOUTH EVERY 8 HOURS AS NEEDED FOR NAUSEA AND VOMITING Patient taking differently: Take 4-8 mg by mouth every 8 (eight) hours as needed for nausea or vomiting.  08/15/19   Mosie Lukes, MD  oxyCODONE-acetaminophen (PERCOCET) 10-325 MG tablet Take 1 tablet by mouth every 6 (six) hours as needed for pain. 03/30/20   Mosie Lukes, MD  peginterferon alfa-2a (PEGASYS) 180 MCG/ML injection Inject 0.75 mLs (135 mcg total) into the skin every 7 (seven) days. 03/12/20   [name], MD  RESTASIS 0.05 % ophthalmic emulsion PLACE ONE DROP INTO [facility] EVERY 12 HOURS 06/09/19   Mosie Lukes, MD  rivaroxaban (XARELTO) 10 MG TABS tablet Take 1 tablet (10 mg total) by mouth daily. 03/18/20   [name], MD  sodium chloride HYPERTONIC 3 % nebulizer solution Take by nebulization daily. 02/16/20   Rigoberto Noel, MD  sodium chloride, hypertonic, 3 % solution SMARTSIG:Via Nebulizer 02/16/20   [provider]  Spacer/Aero-Holding Chambers ([place]) MISC USE as directed. Use with inhaler 05/13/19   Mosie Lukes, MD  SSD 1 % cream APPLY TOPICALLY DAILY 03/31/19   Mosie Lukes, MD  SYRINGE-NEEDLE, DISP, 3 ML (B-D 3CC LUER-LOK SYR 25GX1") 25G X 1" 3 ML MISC 1 Syringe by Subdermal route once a week. 12/02/19   [provider]  traZODone (DESYREL) 100 MG tablet TAKE 1 TABLET BY MOUTH AT BEDTIME Patient taking differently: Take 100 mg by mouth at bedtime.  09/26/19   Mosie Lukes, MD  TUBERCULIN SYR 1CC/27GX1/2" (B-D  TB SYRINGE 1CC/27GX1/2") 27G X 1/2" 1 ML MISC USE AS DIRECTED ONCE WEEKLY FOR PEG-INTERFERON INJECTIONS 09/09/19   [name], MD  venlafaxine XR (EFFEXOR-XR) 75 MG 24 hr capsule TAKE 1 CAPSULE BY MOUTH THREE TIMES DAILY Patient taking differently: Take 75 mg by mouth in the morning, at noon, and at bedtime.  09/26/19   Mosie Lukes, MD     Allergies:    No Known Allergies   Physical Exam:   Vitals  Blood pressure 117/71, pulse 94, temperature 98.1 F (36.7 C), temperature source Oral, resp. rate 17, height '5\' 4"'  (1.626 m), weight 64.4 kg, last menstrual period 07/31/1990, SpO2 98 %.  1.  General: Laying left lateral decubitus in bed  2. Psychiatric: Irritable, oriented to self and place, not  to time Cooperative with exam  3. Neurologic: At baseline, no new focal deficits Left sided weakness in the upper and lower extremity   4. HEENMT:  Head is AT, [place] Mouth is erythematous with thick plaques on tongue  5. Respiratory : LCTABL  6. Cardiovascular : HRRR No rubs or gallops No peripheral edema  7. Gastrointestinal:  Abdomen is soft, non distended, tender to LLQ   8. Skin:  Stage IV sacral decubitus ulcer Superficial ulcer of heel   9.Musculoskeletal:  No peripheral edema or acute deformity    Data Review:    CBC Recent Labs  Lab 04/16/20 2038  WBC 13.4*  HGB 9.3*  HCT 31.0*  PLT 475*  MCV 106.2*  MCH 31.8  MCHC 30.0  RDW 15.3  LYMPHSABS 1.0  MONOABS 0.8  EOSABS 0.1  BASOSABS 0.0   ------------------------------------------------------------------------------------------------------------------  Results for orders placed or performed during the hospital encounter of 04/16/20 (from the past 48 hour(s))  SARS Coronavirus 2 by RT PCR (hospital order, performed in [facility] hospital lab) Nasopharyngeal Nasopharyngeal Swab     Status: None   Collection Time: 04/16/20  7:39 PM   Specimen: Nasopharyngeal Swab  Result Value Ref Range   SARS  Coronavirus 2 NEGATIVE NEGATIVE    Comment: (NOTE) SARS-CoV-2 target nucleic acids are NOT DETECTED.  The SARS-CoV-2 RNA is generally detectable in upper and lower respiratory specimens during the acute phase of infection. The lowest concentration of SARS-CoV-2 viral copies this assay can detect is 250 copies / mL. A negative result does not preclude SARS-CoV-2 infection and should not be used as the sole basis for treatment or other patient management decisions.  A negative result may occur with improper specimen collection / handling, submission of specimen other than nasopharyngeal swab, presence of viral mutation(s) within the areas targeted by this assay, and inadequate number of viral copies (<250 copies / mL). A negative result must be combined with clinical observations, patient history, and epidemiological information.  Fact Sheet for Patients:   StrictlyIdeas.no  Fact Sheet for Healthcare Providers: BankingDealers.co.za  This test is not yet approved or  cleared by the [place] FDA and has been authorized for detection and/or diagnosis of SARS-CoV-2 by FDA under an Emergency Use Authorization (EUA).  This EUA will remain in effect (meaning this test can be used) for the duration of the COVID-19 declaration under Section 564(b)(1) of the Act, 21 U.S.C. section 360bbb-3(b)(1), unless the authorization is terminated or revoked sooner.  Performed at [facility], [address], [place], [place] [address]   Magnesium     Status: None   Collection Time: 04/16/20  7:48 PM  Result Value Ref Range   Magnesium 2.1 1.7 - 2.4 mg/dL    Comment: Performed at [facility], [address], [place], [place] [address]  Comprehensive metabolic panel     Status: Abnormal   Collection Time: 04/16/20  8:38 PM  Result Value Ref Range   Sodium 139 135 - 145 mmol/L   Potassium 2.4 (LL) 3.5 - 5.1 mmol/L    Comment: CRITICAL RESULT CALLED TO,  READ BACK BY AND VERIFIED WITH: TUTTLE,A ON 04/16/20 AT 2140 BY LOY,C    Chloride 96 (L) 98 - 111 mmol/L   CO2 33 (H) 22 - 32 mmol/L   Glucose, Bld 103 (H) 70 - 99 mg/dL    Comment: Glucose reference range applies only to samples taken after fasting for at least 8 hours.   BUN 30 (H) 8 -  23 mg/dL   Creatinine, Ser 1.06 (H) 0.44 - 1.00 mg/dL   Calcium 8.8 (L) 8.9 - 10.3 mg/dL   Total Protein 7.2 6.5 - 8.1 g/dL   Albumin 2.5 (L) 3.5 - 5.0 g/dL   AST 78 (H) 15 - 41 U/L   ALT 27 0 - 44 U/L   Alkaline Phosphatase 182 (H) 38 - 126 U/L   Total Bilirubin 1.1 0.3 - 1.2 mg/dL   GFR calc non Af Amer 54 (L) >60 mL/min   GFR calc Af Amer >60 >60 mL/min   Anion gap 10 5 - 15    Comment: Performed at [facility], [address], [place], [place] [address]  CBC with Differential     Status: Abnormal   Collection Time: 04/16/20  8:38 PM  Result Value Ref Range   WBC 13.4 (H) 4.0 - 10.5 K/uL   RBC 2.92 (L) 3.87 - 5.11 MIL/uL   Hemoglobin 9.3 (L) 12.0 - 15.0 g/dL   HCT 31.0 (L) 36 - 46 %   MCV 106.2 (H) 80.0 - 100.0 fL   MCH 31.8 26.0 - 34.0 pg   MCHC 30.0 30.0 - 36.0 g/dL   RDW 15.3 11.5 - 15.5 %   Platelets 475 (H) 150 - 400 K/uL   nRBC 0.0 0.0 - 0.2 %   Neutrophils Relative % 85 %   Neutro Abs 11.4 (H) 1.7 - 7.7 K/uL   Lymphocytes Relative 7 %   Lymphs Abs 1.0 0.7 - 4.0 K/uL   Monocytes Relative 6 %   Monocytes Absolute 0.8 0 - 1 K/uL   Eosinophils Relative 1 %   Eosinophils Absolute 0.1 0 - 0 K/uL   Basophils Relative 0 %   Basophils Absolute 0.0 0 - 0 K/uL   Immature Granulocytes 1 %   Abs Immature Granulocytes 0.13 (H) 0.00 - 0.07 K/uL    Comment: Performed at [facility], [address] [place], [place] [address]  Lactic acid, plasma     Status: None   Collection Time: 04/16/20  9:16 PM  Result Value Ref Range   Lactic Acid, Venous 1.2 0.5 - 1.9 mmol/L    Comment: Performed at [facility], [address] [place], [place] [address]   *Note: Due to a large number of results  and/or encounters for the requested time period, some results have not been displayed. A complete set of results can be found in Results Review.    Chemistries  Recent Labs  Lab 04/16/20 1948 04/16/20 2038  NA  --  139  K  --  2.4*  CL  --  96*  CO2  --  33*  GLUCOSE  --  103*  BUN  --  30*  CREATININE  --  1.06*  CALCIUM  --  8.8*  MG 2.1  --   AST  --  78*  ALT  --  27  ALKPHOS  --  182*  BILITOT  --  1.1   ------------------------------------------------------------------------------------------------------------------  ------------------------------------------------------------------------------------------------------------------ GFR: Estimated Creatinine Clearance: 43.9 mL/min (A) (by C-G formula based on SCr of 1.06 mg/dL (H)). Liver Function Tests: Recent Labs  Lab 04/16/20 2038  AST 78*  ALT 27  ALKPHOS 182*  BILITOT 1.1  PROT 7.2  ALBUMIN 2.5*   No results for input(s): LIPASE, AMYLASE in the last 168 hours. No results for input(s): AMMONIA in the last 168 hours. Coagulation Profile: No results for input(s): INR, PROTIME in the last 168 hours. Cardiac Enzymes: No results for  input(s): CKTOTAL, CKMB, CKMBINDEX, TROPONINI in the last 168 hours. BNP (last 3 results) No results for input(s): PROBNP in the last 8760 hours. HbA1C: No results for input(s): HGBA1C in the last 72 hours. CBG: No results for input(s): GLUCAP in the last 168 hours. Lipid Profile: No results for input(s): CHOL, HDL, LDLCALC, TRIG, CHOLHDL, LDLDIRECT in the last 72 hours. Thyroid Function Tests: No results for input(s): TSH, T4TOTAL, FREET4, T3FREE, THYROIDAB in the last 72 hours. Anemia Panel: No results for input(s): VITAMINB12, FOLATE, FERRITIN, TIBC, IRON, RETICCTPCT in the last 72 hours.  --------------------------------------------------------------------------------------------------------------- Urine analysis:    Component Value Date/Time   COLORURINE YELLOW  06/17/2018 1529   APPEARANCEUR CLEAR 06/17/2018 1529   LABSPEC 1.020 06/17/2018 1529   PHURINE 5.0 06/17/2018 1529   GLUCOSEU NEGATIVE 06/17/2018 1529   HGBUR NEGATIVE 06/17/2018 1529   BILIRUBINUR NEGATIVE 06/17/2018 1529   BILIRUBINUR neg 03/20/2014 1008   KETONESUR NEGATIVE 06/17/2018 1529   PROTEINUR 30 (A) 03/21/2017 1315   UROBILINOGEN 0.2 06/17/2018 1529   NITRITE NEGATIVE 06/17/2018 1529   LEUKOCYTESUR NEGATIVE 06/17/2018 1529      Imaging Results:    DG Chest 1 View  Result Date: 04/16/2020 CLINICAL DATA:  Sacral ulcer EXAM: CHEST  1 VIEW COMPARISON:  03/05/2020 FINDINGS: Scattered opacities within both lungs. No large consolidation. No pleural effusion or pneumothorax. Normal cardiomediastinal contours. IMPRESSION: Scattered opacities within both lungs, likely indicating infection. Electronically Signed   By: Ulyses Jarred M.D.   On: 04/16/2020 23:53   DG Pelvis 1-2 Views  Result Date: 04/16/2020 CLINICAL DATA:  Sacral ulcer EXAM: PELVIS - 1-2 VIEW COMPARISON:  None. FINDINGS: There is no evidence of pelvic fracture or diastasis. No pelvic bone lesions are seen. IMPRESSION: Negative. Electronically Signed   By: Ulyses Jarred M.D.   On: 04/16/2020 23:46   DG Sacrum/Coccyx  Result Date: 04/16/2020 CLINICAL DATA:  Sacral ulcer EXAM: SACRUM AND COCCYX - 2+ VIEW COMPARISON:  None. FINDINGS: There is subcutaneous gas that abuts the coccyx on the lateral projection. There is no osteolysis. There is nearby soft tissue ulceration. IMPRESSION: Subcutaneous gas abuts the coccyx on the lateral projection with nearby soft tissue ulceration. This likely indicates osseous exposure to the external environment. Electronically Signed   By: Ulyses Jarred M.D.   On: 04/16/2020 23:48   DG Foot Complete Right  Result Date: 04/16/2020 CLINICAL DATA:  Heel ulcer EXAM: RIGHT FOOT COMPLETE - 3+ VIEW COMPARISON:  None. FINDINGS: There is no evidence of fracture or dislocation. There is no evidence of  arthropathy or other focal bone abnormality. Superficial ulceration at the heel IMPRESSION: Superficial ulceration at the heel. No radiographic evidence of osteomyelitis. Electronically Signed   By: Ulyses Jarred M.D.   On: 04/16/2020 23:49    My personal review of EKG: Rhythm ST, Rate 109/min, QTc 466,no Acute ST changes   Assessment & Plan:    Active Problems:   Osteomyelitis ([place])   1. Sepsis 2/2 pneumonia and osteomyelitis 1. CXR = Scattered opacities w/in both lungs 2. XR sacrum = indicative of osteomyelitis 3. HR as high as 111, and WBC 13.4 4. Blood culture pending 5. Sputum culture pending 6. Continue vanc and zosyn 7. 1L bolus in ED continue IV fluids 8. Lactic acid pending 9. Consult gen surg for possible debridement of ulcer 2. Pneumonia 1. See plan above 3. Sacral decubitus ulcer 1. Continue plan as above 2. Consult gen surg 4. Hypokalemia 1. K+ 2.4 2. 22mq in ED 3. Continue  65mq IV q1H x 4 doses 5. Protein cal malnutrition 1. Continue protein shakes   DVT Prophylaxis-   Xarelto - SCDs   AM Labs Ordered, also please review Full Orders  Family Communication: No family at bedside Code Status:  Full  Admission status: Inpatient :The appropriate admission status for this patient is INPATIENT. Inpatient status is judged to be reasonable and necessary in order to provide the required intensity of service to ensure the patient's safety. The patient's presenting symptoms, physical exam findings, and initial radiographic and laboratory data in the context of their chronic comorbidities is felt to place them at high risk for further clinical deterioration. Furthermore, it is not anticipated that the patient will be medically stable for discharge from the hospital within 2 midnights of admission. The following factors support the admission status of inpatient.  The patient's presenting symptoms include painful decubitus ulcer The worrisome physical exam findings include  Malodorous drainage from the ulcer The initial radiographic and laboratory data are worrisome because of XR indicative of osteomyelitis of sacrum The chronic co-morbidities include hx of CVA, Thyroid disease, seizure, secondary myelofibrosis, GERD, COPD       * I certify that at the point of admission it is my clinical judgment that the patient will require inpatient hospital care spanning beyond 2 midnights from the point of admission due to high intensity of service, high risk for further deterioration and high frequency of surveillance required.*  Time spent in minutes : 6[place]

## 2020-04-17 NOTE — Progress Notes (Signed)
Patient admitted to the hospital earlier this morning by Dr. Clearence Ped  Patient seen and examined.  She is confused at this time.  She wakes up to voice.  Does not know she is in the hospital.  She has some lower extremity edema bilaterally which is nonpitting.  She is noted to have temporal wasting.  She does not have much urine output through the day.  P.o. intake has been minimal through the day.  Sacral wound images reviewed and discussed with Dr. Constance Haw.  A/P:  1.  Infected sacral decubitus ulcer.  Seen by general surgery and debridement done at bedside.  She is on broad-spectrum antibiotics.  Imaging indicates underlying osteomyelitis.  Only to discuss with infectious disease regarding course of antibiotics.  Will request wound culture.  2.  Possible pneumonia.  She is on broad-spectrum antibiotics.  Follow-up cultures.  3.  Sepsis.  Likely related to #1 and #2.  Vitals appear to be improving.  Continue antibiotics and follow-up cultures.  4.  Recently diagnosed superficial thrombus.  Followed by oncology and started on Xarelto.  Plan is for 2 months of anticoagulation.  5.  Essential thrombocythemia.  She is chronically on interferon through oncology.  Platelet counts do not appear to be substantially elevated.  6.  Failure to thrive with severe protein calorie malnutrition.  Albumin of 2.2, I suspect this will continue to decline with IV hydration.  Nutrition consult.  7.  Hypokalemia.  Replaced  8.  Acute encephalopathy.  Unclear etiology.  Appears to be more chronic process.  Will check B12, ammonia, TSH.  Does not appear to have any focal deficits.  9.  COPD.  No complaints of shortness of breath at this time.  [employer]

## 2020-04-17 NOTE — TOC Initial Note (Signed)
Transition of Care [facility]) - Initial/Assessment Note    Patient Details  Name: Rachael Johnson MRN: [id number] Date of Birth: April 28, 1951  [facility] CM/SW Contact:    Natasha Bence, LCSW Phone Number: 04/17/2020, 1:37 PM  Clinical Narrative:                 Patient is a 69 year old female admitted for Sacral decubitus ulcer, stage IV. CSW provided assessment for patient. Patient's son reported that patient is currently active with [facility] and [place]. Patient's son reported concerns for her care due to the progression of the patient's ulcer. Patient's son reported that patient has varying altered mental status and has been tested for dementia and alzheimer and was not diagnosed for either. Patient's son reported that patient often times will be fully oriented in the beginning of the day but towards the afternoon, or when she is tired, becomes disoriented. Patient's son reported that he and the family would consider SNF, but wanted to consult with the patient's PCP first. TOC to follow.    Barriers to Discharge: Continued Medical Work up   Patient Goals and CMS Choice Patient states their goals for this hospitalization and ongoing recovery are:: Return with [facility] or SNF   Choice offered to / list presented to : Adult Children  Expected Discharge Plan and Services         Living arrangements for the past 2 months: Single Family Home                                      Prior Living Arrangements/Services Living arrangements for the past 2 months: Single Family Home Lives with:: Adult Children Patient language and need for interpreter reviewed:: Yes        Need for Family Participation in Patient Care: Yes (Comment) Care giver support system in place?: Yes (comment) Current home services: DME, Home RN, Home PT Criminal Activity/Legal Involvement Pertinent to Current Situation/Hospitalization: No - Comment as needed  Activities of Daily Living   ADL  Screening (condition at time of admission) Patient's cognitive ability adequate to safely complete daily activities?: No Is the patient deaf or have difficulty hearing?: No Does the patient have difficulty seeing, even when wearing glasses/contacts?: No Does the patient have difficulty concentrating, remembering, or making decisions?: Yes Patient able to express need for assistance with ADLs?: Yes Does the patient have difficulty dressing or bathing?: Yes Independently performs ADLs?: No Communication: Independent Dressing (OT): Dependent Is this a change from baseline?: Pre-admission baseline Grooming: Dependent Is this a change from baseline?: Pre-admission baseline Feeding: Independent Bathing: Dependent Is this a change from baseline?: Pre-admission baseline Toileting: Dependent Is this a change from baseline?: Pre-admission baseline In/Out Bed: Dependent Is this a change from baseline?: Pre-admission baseline Walks in Home: Dependent Is this a change from baseline?: Pre-admission baseline Does the patient have difficulty walking or climbing stairs?: Yes Weakness of Legs: Both Weakness of Arms/Hands: None  Permission Sought/Granted Permission sought to share information with : Family Supports, PCP Permission granted to share information with : Yes, Verbal Permission Granted  Share Information with NAME: [name]  Permission granted to share info w AGENCY: SNF's  Permission granted to share info w Relationship: son  Permission granted to share info w Contact Information: [phone]  Emotional Assessment       Orientation: : Oriented to Self Alcohol / Substance  Use: Not Applicable Psych Involvement: No (comment)  Admission diagnosis:  Hypokalemia [E87.6] Osteomyelitis ([place]) [M86.9] Pressure injury of sacral region, stage 4 [facility]) [L89.154] Patient Active Problem List   Diagnosis Date Noted  . Osteomyelitis ([place]) 04/17/2020  . Sacral decubitus ulcer, stage IV  ([place]) 04/17/2020  . Decubitus ulcer of left heel, unstageable ([place]) 04/17/2020  . Pernicious anemia 10/31/2019  . Hypokalemia 10/16/2019  . Insomnia 02/05/2019  . Urinary frequency 06/19/2018  . 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 Sinusitis 11/05/2017  . Chronic back pain 09/09/2017  . Osteoarthritis 05/24/2017  . Iron deficiency anemia due to chronic blood loss 04/03/2017  . Hypomagnesemia 03/18/2017  . Right shoulder pain 03/14/2017  . LFTs abnormal 03/13/2017  . Hypernatremia 03/13/2017  . Elevated LFTs   . COPD (chronic obstructive pulmonary disease) ([place]) 01/23/2017  . Lung nodule 12/18/2016  . Bronchiectasis assoc with MPNs 11/28/2016  . Cough variant asthma vs UACS 11/27/2016  . Vitamin D deficiency 03/07/2016  . Dysuria 03/07/2016  . Essential hypertension 03/07/2016  . Benign paroxysmal positional vertigo 03/07/2016  . Hyperglycemia 12/17/2015  . Cataract 12/21/2014  . Restless leg syndrome 09/30/2014  . Allergies 04/27/2014  . Constipation 11/24/2013  . Preventative health care 04/20/2013  . Hyperlipidemia 02/25/2013  . H. pylori infection 12/19/2012  . Umbilical hernia 40/81/4481  . Macrocytic anemia 07/08/2012  . Weight loss 12/29/2011  . Depression with anxiety 03/24/2011  . 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 Hypothyroid 02/06/2011  . Thrombocythemia ([place]) 01/24/2011   PCP:  Mosie Lukes, MD Pharmacy:   [facility] [address] [place] [address] [place] [address] Phone: [phone] Fax: [phone]  [place], [place] [address] [place] [address] Phone: [phone] Fax: [place], [place] [address] Phone: [phone] Fax: [phone]  [place], [place] [address] [place] [address] Phone:  [phone] Fax: [phone]     Social Determinants of Health (SDOH) Interventions    Readmission Risk Interventions No flowsheet data found.

## 2020-04-17 NOTE — Progress Notes (Signed)
[facility]  Spoke with [name] [phone]. He will be out of town tonight and asked if any updates or issues overnight to call his wife Ruby Cola [phone].   Curlene Labrum, MD [facility] [address] [place], [place] [address] [phone] (office)

## 2020-04-17 NOTE — Progress Notes (Signed)
Initial Nutrition Assessment  DOCUMENTATION CODES:   Not applicable  INTERVENTION:  Continue Ensure Enlive po BID, each supplement provides 350 kcal and 20 grams of protein  Magic cup BID with meals, each supplement provides 290 kcal and 9 grams of protein  Ocuvite daily for wound healing (provides zinc, vitamin A, vitamin C, Vitamin E, copper, and selenium)  Daily weights  NUTRITION DIAGNOSIS:   Increased nutrient needs related to wound healing as evidenced by estimated needs.   GOAL:   Patient will meet greater than or equal to 90% of their needs    MONITOR:   PO intake, Supplement acceptance, Labs, I & O's, Skin, Weight trends  REASON FOR ASSESSMENT:   Malnutrition Screening Tool, Consult Assessment of nutrition requirement/status  ASSESSMENT:  RD working remotely.  69 year old female with history of CVA with residual left sided weakness and ambulates with wheelchair, thyroid disease, RLS, GERD, and COPD admitted with sepsis secondary to infected sacral decubitus ulcer.  Patient seen by general surgery and is s/p bedside debridement. She is noted confused, unaware that she is in the hospital this evening with minimal po intake throughout the day. Patient on regular diet and receiving Ensure Enlive, per meds she has been accepting of supplement today x 2. Will continue this as well as provide Magic Cup on lunch and dinner trays to aid with meeting needs and order Ocuvite to support wound healing. Will follow-up with pt on Monday to obtain nutrition history and complete exam.  Per chart, weights have trended down ~4 lbs in the last 2 months which is insignificant, however unsure of accuracy of admission wts, pt 64.4 kg on 9/17, 58.4 kg on 9/18 at 0145 then at 0329 pt wt 64.4 kg. Given pt ambulates with wheelchair s/p recent stroke, stage IV pressure injury, DTI to left heel as well as noted temporal wasting, would expect more significant wt trends. Will order daily weights.    Medications reviewed and include:  IVPB: vancomycin, zosyn IVF: NaCl @ 100 ml/hr  Labs: WBC 11.8 (H), Hgb 8.6 (L), HCT 29.1 (L) K/Mg/P - WNL  NUTRITION - FOCUSED PHYSICAL EXAM: Unable to complete at this time, RD working remotely.  Diet Order:   Diet Order            Diet regular Room service appropriate? Yes; Fluid consistency: Thin  Diet effective now                 EDUCATION NEEDS:   Not appropriate for education at this time  Skin:  Skin Assessment: Skin Integrity Issues: Skin Integrity Issues:: DTI, Stage IV DTI: L;heel Stage IV: sacrum  Last BM:  pta  Height:   Ht Readings from Last 1 Encounters:  04/16/20 5\' 4"  (1.626 m)    Weight:   Wt Readings from Last 1 Encounters:  04/17/20 64.4 kg    Ideal Body Weight:  54.5 kg  BMI:  Body mass index is 24.37 kg/m.  Estimated Nutritional Needs:   Kcal:  4854-6270  Protein:  96-106  Fluid:  > 1.6 L/day   Lajuan Lines, RD, LDN Clinical Nutrition After Hours/Weekend Pager # in [place]

## 2020-04-18 DIAGNOSIS — D473 Essential (hemorrhagic) thrombocythemia: Secondary | ICD-10-CM

## 2020-04-18 DIAGNOSIS — E876 Hypokalemia: Secondary | ICD-10-CM

## 2020-04-18 DIAGNOSIS — E032 Hypothyroidism due to medicaments and other exogenous substances: Secondary | ICD-10-CM

## 2020-04-18 DIAGNOSIS — J449 Chronic obstructive pulmonary disease, unspecified: Secondary | ICD-10-CM

## 2020-04-18 LAB — VITAMIN B12: Vitamin B-12: 736 pg/mL (ref 180–914)

## 2020-04-18 LAB — COMPREHENSIVE METABOLIC PANEL
ALT: 24 U/L (ref 0–44)
AST: 58 U/L — ABNORMAL HIGH (ref 15–41)
Albumin: 2 g/dL — ABNORMAL LOW (ref 3.5–5.0)
Alkaline Phosphatase: 161 U/L — ABNORMAL HIGH (ref 38–126)
Anion gap: 10 (ref 5–15)
BUN: 18 mg/dL (ref 8–23)
CO2: 27 mmol/L (ref 22–32)
Calcium: 8.2 mg/dL — ABNORMAL LOW (ref 8.9–10.3)
Chloride: 102 mmol/L (ref 98–111)
Creatinine, Ser: 0.7 mg/dL (ref 0.44–1.00)
GFR calc Af Amer: >60 mL/min (ref >60)
GFR calc non Af Amer: >60 mL/min (ref >60)
Glucose, Bld: 101 mg/dL — ABNORMAL HIGH (ref 70–99)
Potassium: 3.3 mmol/L — ABNORMAL LOW (ref 3.5–5.1)
Sodium: 139 mmol/L (ref 135–145)
Total Bilirubin: 0.8 mg/dL (ref 0.3–1.2)
Total Protein: 5.9 g/dL — ABNORMAL LOW (ref 6.5–8.1)

## 2020-04-18 LAB — CBC
HCT: 28.2 % — ABNORMAL LOW (ref 36.0–46.0)
Hemoglobin: 8.4 g/dL — ABNORMAL LOW (ref 12.0–15.0)
MCH: 32.2 pg (ref 26.0–34.0)
MCHC: 29.8 g/dL — ABNORMAL LOW (ref 30.0–36.0)
MCV: 108 fL — ABNORMAL HIGH (ref 80.0–100.0)
Platelets: 451 10*3/uL — ABNORMAL HIGH (ref 150–400)
RBC: 2.61 MIL/uL — ABNORMAL LOW (ref 3.87–5.11)
RDW: 15.5 % (ref 11.5–15.5)
WBC: 12.2 10*3/uL — ABNORMAL HIGH (ref 4.0–10.5)
nRBC: 0 % (ref 0.0–0.2)

## 2020-04-18 LAB — AMMONIA: Ammonia: 18 umol/L (ref 9–35)

## 2020-04-18 LAB — TSH: TSH: 4.776 u[IU]/mL — ABNORMAL HIGH (ref 0.350–4.500)

## 2020-04-18 LAB — GLUCOSE, CAPILLARY: Glucose-Capillary: 103 mg/dL — ABNORMAL HIGH (ref 70–99)

## 2020-04-18 MED ORDER — ALPRAZOLAM 0.25 MG PO TABS
0.2500 mg | ORAL_TABLET | Freq: Three times a day (TID) | ORAL | Status: DC | PRN
  Administered 2020-04-18: 0.25 mg via ORAL
  Filled 2020-04-18: qty 1

## 2020-04-18 MED ORDER — POTASSIUM CHLORIDE 10 MEQ/100ML IV SOLN
10.0000 meq | INTRAVENOUS | Status: AC
  Administered 2020-04-18 (×4): 10 meq via INTRAVENOUS
  Filled 2020-04-18 (×4): qty 100

## 2020-04-18 NOTE — Progress Notes (Signed)
   04/18/20 2028  Assess: MEWS Score  Temp 98 F (36.7 C)  BP 120/64  Pulse Rate (!) 113  SpO2 98 %  O2 Device Nasal Cannula  Assess: MEWS Score  MEWS Temp 0  MEWS Systolic 0  MEWS Pulse 2  MEWS RR 0  MEWS LOC 0  MEWS Score 2  MEWS Score Color Yellow  Assess: if the MEWS score is Yellow or Red  Were vital signs taken at a resting state? No  Focused Assessment Change from prior assessment (see assessment flowsheet)  Early Detection of Sepsis Score *See Row Information* Low  MEWS guidelines implemented *See Row Information* No, vital signs rechecked

## 2020-04-18 NOTE — Progress Notes (Signed)
PROGRESS NOTE    Rachael Johnson  CHY:[id number] DOB: 03-May-1951 DOA: 04/16/2020 PCP: Mosie Lukes, MD    Brief Narrative:   Rachael Johnson  is a 69 y.o. female, with history of CVA with residual left sided weakness, thyroid disease, seizure, rhabdo, restless leg syndrome, GERD, COPD, and more presents to the ED with a chief complaint of painful ulcer. Patient reports that the ulcer started 2 months ago. She says that she has not had any previous treatments for it. It started out small, and has worsened in size and pain over the two months. She reports that propping herself up with a pillow makes the pain better. Palpating or laying supine make it worse. Patient reports that since her stroke she has only been able to ambulate with a wheelchair. She is not sure when, but the wound has started draining brown, malodorous fluid. Patient reports that she has been feverish with Tmax 102. She has not taken any antipyretics. Patient reports that she has had a cough that also started two months ago. The cough has been improving recently. The cough has been non productive. She reports that she has had dyspnea, but has not had to use more than her 4LNC that is her baseline. She reports no chest pain, palpitations, or peripheral edema. She reports a normal appetite, and normal water intake. Patient does report painful mouth and blisters. She does use steroid inhalers at home.   Assessment & Plan:   Principal Problem:   Sacral decubitus ulcer, stage IV (HCC) Active Problems:   Thrombocythemia ([place])   Hypothyroid   COPD (chronic obstructive pulmonary disease) (HCC)   Osteomyelitis (HCC)   Decubitus ulcer of left heel, unstageable ([place])   1.  Infected sacral decubitus ulcer.  Seen by general surgery and debridement done at bedside.  She is on broad-spectrum antibiotics.  Imaging indicates underlying osteomyelitis.    Will need to discuss with infectious disease regarding course of antibiotics.    Wound  culture requested  2.  Possible pneumonia.  She is on broad-spectrum antibiotics.    Blood cultures have shown no growth to date will check CT of chest.  3.  Sepsis.  Likely related to #1 and #2.  Vitals appear to be improving.  Continue antibiotics.  Blood cultures have shown no growth to date  4.  Recently diagnosed superficial thrombus.  Followed by oncology and started on Xarelto.  Plan is for 2 months of anticoagulation.  5.  Essential thrombocythemia.  She is chronically on interferon through oncology.  Platelet counts do not appear to be substantially elevated.  6.  Failure to thrive with severe protein calorie malnutrition.  Albumin of 2.0, I suspect this will continue to decline with IV hydration.  Nutrition consult.  7.  Hypokalemia.  Replaced  8.  Acute encephalopathy.  Metabolic work-up is unrevealing.  She does not appear to have any focal deficits.  After discussing with patient's son, appears to be a more chronic process.  Likely has some degree of mild cognitive impairment at baseline and symptoms got substantially worse after recent prolonged admission for pneumonia.  9.  COPD.  No complaints of shortness of breath at this time.  10.  Constipation.  Provided enema.  Started on Linzess.  11.  Urinary retention.  Likely related to constipation.  Continue Foley catheter.  12.  Goals of care.  Long discussion with patient's son Rachael Johnson.  Patient has had a significant decline over the past 2 months.  It appears that she did have some mild cognitive impairment prior to this, but was functional and active.  Since her admission approximately 2 months ago for pneumonia which was prolonged, she has had a consistent decline.  Her p.o. intake has been poor and she has been losing weight.  She is developed a significant sacral ulcer that will be very difficult to heal considering her poor nutritional status.  Will request palliative care consult to help further address goals of  care.  Long-term prognosis is poor.   DVT prophylaxis: rivaroxaban (XARELTO) tablet 10 mg Start: 04/17/20 1000 SCDs Start: 04/17/20 0148 rivaroxaban (XARELTO) tablet 10 mg  Code Status: Full code Family Communication: Discussed with patient's son, Rachael Johnson Disposition Plan: Status is: Inpatient  Remains inpatient appropriate because:Inpatient level of care appropriate due to severity of illness   Dispo: The patient is from: Home              Anticipated d/c is to: TBD              Anticipated d/c date is: 2 days              Patient currently is not medically stable to d/c.   Consultants:   General surgery  Procedures:   Bedside debridement of sacral wound  Antimicrobials:   Vancomycin 9/18 >  Zosyn 9/18 >   Subjective: Patient is confused.  She thinks she is at home.  Denies any significant pain at this time.  Objective: Vitals:   04/18/20 0418 04/18/20 0811 04/18/20 1610 04/18/20 1951  BP: 136/78  129/68   Pulse: (!) 101  99   Resp: 18  16   Temp: 97.9 F (36.6 C)  98.8 F (37.1 C)   TempSrc:      SpO2: 98% 96% 96% 93%  Weight:      Height:        Intake/Output Summary (Last 24 hours) at 04/18/2020 2011 Last data filed at 04/18/2020 0900 Gross per 24 hour  Intake 1413.02 ml  Output 650 ml  Net 763.02 ml   Filed Weights   04/17/20 0145 04/17/20 0329 04/18/20 0413  Weight: 58.4 kg 64.4 kg 60.4 kg    Examination:  General exam: Appears calm and comfortable, somnolent, temporal wasting present  Respiratory system: Clear to auscultation. Respiratory effort normal. Cardiovascular system: S1 & S2 heard, RRR. No JVD, murmurs, rubs, gallops or clicks. No pedal edema. Gastrointestinal system: Abdomen is nondistended, soft and nontender. No organomegaly or masses felt. Normal bowel sounds heard. Central nervous system: No focal neurological deficits. Extremities: no edema bilaterally. Skin: large sacral wound Psychiatry: confused    Data Reviewed:  I have personally reviewed following labs and imaging studies  CBC: Recent Labs  Lab 04/16/20 2038 04/17/20 0630 04/18/20 0812  WBC 13.4* 11.8* 12.2*  NEUTROABS 11.4* 10.0*  --   HGB 9.3* 8.6* 8.4*  HCT 31.0* 29.1* 28.2*  MCV 106.2* 107.0* 108.0*  PLT 475* 427* 099*   Basic Metabolic Panel: Recent Labs  Lab 04/16/20 1948 04/16/20 2038 04/17/20 0630 04/18/20 0812  NA  --  139 138 139  K  --  2.4* 3.6 3.3*  CL  --  96* 99 102  CO2  --  33* 31 27  GLUCOSE  --  103* 106* 101*  BUN  --  30* 25* 18  CREATININE  --  1.06* 0.82 0.70  CALCIUM  --  8.8* 8.2* 8.2*  MG 2.1  --  2.0  --  PHOS  --   --  2.8  --    GFR: Estimated Creatinine Clearance: 58.1 mL/min (by C-G formula based on SCr of 0.7 mg/dL). Liver Function Tests: Recent Labs  Lab 04/16/20 2038 04/17/20 0630 04/18/20 0812  AST 78* 72* 58*  ALT 27 25 24   ALKPHOS 182* 156* 161*  BILITOT 1.1 0.7 0.8  PROT 7.2 6.4* 5.9*  ALBUMIN 2.5* 2.2* 2.0*   No results for input(s): LIPASE, AMYLASE in the last 168 hours. Recent Labs  Lab 04/18/20 0812  AMMONIA 18   Coagulation Profile: No results for input(s): INR, PROTIME in the last 168 hours. Cardiac Enzymes: No results for input(s): CKTOTAL, CKMB, CKMBINDEX, TROPONINI in the last 168 hours. BNP (last 3 results) No results for input(s): PROBNP in the last 8760 hours. HbA1C: No results for input(s): HGBA1C in the last 72 hours. CBG: No results for input(s): GLUCAP in the last 168 hours. Lipid Profile: No results for input(s): CHOL, HDL, LDLCALC, TRIG, CHOLHDL, LDLDIRECT in the last 72 hours. Thyroid Function Tests: Recent Labs    04/18/20 0812  TSH 4.776*   Anemia Panel: Recent Labs    04/18/20 0812  VITAMINB12 736   Sepsis Labs: Recent Labs  Lab 04/16/20 2116 04/17/20 0013  LATICACIDVEN 1.2 2.1*    Recent Results (from the past 240 hour(s))  SARS Coronavirus 2 by RT PCR (hospital order, performed in [facility] hospital lab) Nasopharyngeal  Nasopharyngeal Swab     Status: None   Collection Time: 04/16/20  7:39 PM   Specimen: Nasopharyngeal Swab  Result Value Ref Range Status   SARS Coronavirus 2 NEGATIVE NEGATIVE Final    Comment: (NOTE) SARS-CoV-2 target nucleic acids are NOT DETECTED.  The SARS-CoV-2 RNA is generally detectable in upper and lower respiratory specimens during the acute phase of infection. The lowest concentration of SARS-CoV-2 viral copies this assay can detect is 250 copies / mL. A negative result does not preclude SARS-CoV-2 infection and should not be used as the sole basis for treatment or other patient management decisions.  A negative result may occur with improper specimen collection / handling, submission of specimen other than nasopharyngeal swab, presence of viral mutation(s) within the areas targeted by this assay, and inadequate number of viral copies (<250 copies / mL). A negative result must be combined with clinical observations, patient history, and epidemiological information.  Fact Sheet for Patients:   StrictlyIdeas.no  Fact Sheet for Healthcare Providers: BankingDealers.co.za  This test is not yet approved or  cleared by the [place] FDA and has been authorized for detection and/or diagnosis of SARS-CoV-2 by FDA under an Emergency Use Authorization (EUA).  This EUA will remain in effect (meaning this test can be used) for the duration of the COVID-19 declaration under Section 564(b)(1) of the Act, 21 U.S.C. section 360bbb-3(b)(1), unless the authorization is terminated or revoked sooner.  Performed at [facility], [address], [place], [place] [address]   Culture, blood (routine x 2)     Status: None (Preliminary result)   Collection Time: 04/16/20  9:16 PM   Specimen: Right Antecubital; Blood  Result Value Ref Range Status   Specimen Description RIGHT ANTECUBITAL  Final   Special Requests   Final    BOTTLES DRAWN  AEROBIC AND ANAEROBIC Blood Culture adequate volume   Culture   Final    NO GROWTH 2 DAYS Performed at [facility], [address], [place], [place] [address]    Report Status PENDING  Incomplete  Culture, blood (routine x 2)     Status: None (Preliminary result)   Collection Time: 04/16/20  9:25 PM   Specimen: BLOOD RIGHT HAND  Result Value Ref Range Status   Specimen Description BLOOD RIGHT HAND  Final   Special Requests   Final    BOTTLES DRAWN AEROBIC AND ANAEROBIC Blood Culture adequate volume   Culture   Final    NO GROWTH 2 DAYS Performed at [facility], [address] [place], [place] [address]    Report Status PENDING  Incomplete         Radiology Studies: DG Chest 1 View  Result Date: 04/16/2020 CLINICAL DATA:  Sacral ulcer EXAM: CHEST  1 VIEW COMPARISON:  03/05/2020 FINDINGS: Scattered opacities within both lungs. No large consolidation. No pleural effusion or pneumothorax. Normal cardiomediastinal contours. IMPRESSION: Scattered opacities within both lungs, likely indicating infection. Electronically Signed   By: Ulyses Jarred M.D.   On: 04/16/2020 23:53   DG Pelvis 1-2 Views  Result Date: 04/16/2020 CLINICAL DATA:  Sacral ulcer EXAM: PELVIS - 1-2 VIEW COMPARISON:  None. FINDINGS: There is no evidence of pelvic fracture or diastasis. No pelvic bone lesions are seen. IMPRESSION: Negative. Electronically Signed   By: Ulyses Jarred M.D.   On: 04/16/2020 23:46   DG Sacrum/Coccyx  Result Date: 04/16/2020 CLINICAL DATA:  Sacral ulcer EXAM: SACRUM AND COCCYX - 2+ VIEW COMPARISON:  None. FINDINGS: There is subcutaneous gas that abuts the coccyx on the lateral projection. There is no osteolysis. There is nearby soft tissue ulceration. IMPRESSION: Subcutaneous gas abuts the coccyx on the lateral projection with nearby soft tissue ulceration. This likely indicates osseous exposure to the external environment. Electronically Signed   By: Ulyses Jarred M.D.   On: 04/16/2020 23:48     CT ABDOMEN PELVIS W CONTRAST  Result Date: 04/17/2020 CLINICAL DATA:  69 year old female with a history of left lower quadrant pain, concerning for intra-abdominal abscess EXAM: CT ABDOMEN AND PELVIS WITH CONTRAST TECHNIQUE: Multidetector CT imaging of the abdomen and pelvis was performed using the standard protocol following bolus administration of intravenous contrast. CONTRAST:  112mL OMNIPAQUE IOHEXOL 300 MG/ML  SOLN COMPARISON:  07/10/2018 FINDINGS: Lower chest: Atelectasis/scarring at the bilateral lung bases. No acute finding at the lung bases. Hepatobiliary: Diffusely decreased attenuation of liver parenchyma. Unremarkable gallbladder. Pancreas: Unremarkable Spleen: Unremarkable Adrenals/Urinary Tract: - Right adrenal gland:  Unremarkable - Left adrenal gland: Unremarkable. - Right kidney: No hydronephrosis, nephrolithiasis, inflammation, or ureteral dilation. Low-density lesion on the lateral cortex of the right kidney, unchanged from the prior, compatible with a benign cyst. - Left Kidney: No hydronephrosis, nephrolithiasis, inflammation, or ureteral dilation. No focal lesion. - Urinary Bladder: Urinary bladder distended. Stomach/Bowel: - Stomach: Unremarkable. - Small bowel: Unremarkable.  Enteric contrast reaches distal bowel. - Appendix: Normal appendix - Colon: Moderate two large formed stool burden. No focal inflammatory changes. No evidence of transition point. Vascular/Lymphatic: Minimal atherosclerotic changes of the abdominal aorta. Unremarkable portal venous system. Unremarkable systemic venous system. Reproductive: Hysterectomy. Other: Incidental imaging of the left arm demonstrates extravasation of contrast within the medial soft tissues, between the muscular compartment in the superficial soft tissues. Edema/inflammatory changes of the left upper extremity in this region, which is incompletely imaged. Musculoskeletal: Soft tissue defect within the sacral region overlying the lower sacrum  and the coccyx. The depth appears to extend to the cortex/periosteum. No abscess present within the superficial soft tissues. No intra-abdominal abscess identified. IMPRESSION: CT is negative for intra-abdominal abscess. No finding  that would account for left lower quadrant pain. Moderate to large formed stool burden. Recommend correlation with any history of constipation. No evidence of bowel obstruction. Steatosis. Sacral decubitus wound, compatible with given history. Distention of the urinary bladder, perhaps incidental. Extravasation of contrast within the medial left upper arm. Correlation with physical exam and history of known contrast extravasation may be useful. Additional ancillary findings as above. Electronically Signed   By: Corrie Mckusick D.O.   On: 04/17/2020 11:14   DG Foot Complete Right  Result Date: 04/16/2020 CLINICAL DATA:  Heel ulcer EXAM: RIGHT FOOT COMPLETE - 3+ VIEW COMPARISON:  None. FINDINGS: There is no evidence of fracture or dislocation. There is no evidence of arthropathy or other focal bone abnormality. Superficial ulceration at the heel IMPRESSION: Superficial ulceration at the heel. No radiographic evidence of osteomyelitis. Electronically Signed   By: Ulyses Jarred M.D.   On: 04/16/2020 23:49        Scheduled Meds: . atorvastatin  10 mg Oral Daily  . busPIRone  15 mg Oral BID  . Chlorhexidine Gluconate Cloth  6 each Topical Daily  . collagenase   Topical BID  . famotidine  20 mg Oral BID  . feeding supplement (ENSURE ENLIVE)  237 mL Oral BID BM  . fluticasone  1 spray Each Nare Daily  . gabapentin  400 mg Oral QHS  . influenza vaccine adjuvanted  0.5 mL Intramuscular Tomorrow-1000  . levothyroxine  100 mcg Oral Q0600  . mometasone-formoterol  2 puff Inhalation BID  . montelukast  10 mg Oral QHS  . multivitamin-lutein  1 capsule Oral QHS  . rivaroxaban  10 mg Oral Daily  . traZODone  100 mg Oral QHS  . venlafaxine XR  75 mg Oral Q breakfast   Continuous  Infusions: . sodium chloride 100 mL/hr at 04/18/20 1805  . piperacillin-tazobactam (ZOSYN)  IV 3.375 g (04/18/20 1548)  . potassium chloride    . vancomycin Stopped (04/17/20 2141)     LOS: 1 day    Time spent: 50mins    Kathie Dike, MD [facility]   If 7PM-7AM, please contact night-coverage [email or web address]  04/18/2020, 8:11 PM

## 2020-04-19 ENCOUNTER — Inpatient Hospital Stay (HOSPITAL_COMMUNITY): Payer: Medicare Other

## 2020-04-19 DIAGNOSIS — Z515 Encounter for palliative care: Secondary | ICD-10-CM

## 2020-04-19 DIAGNOSIS — G9340 Encephalopathy, unspecified: Secondary | ICD-10-CM

## 2020-04-19 LAB — COMPREHENSIVE METABOLIC PANEL
ALT: 24 U/L (ref 0–44)
AST: 56 U/L — ABNORMAL HIGH (ref 15–41)
Albumin: 1.8 g/dL — ABNORMAL LOW (ref 3.5–5.0)
Alkaline Phosphatase: 166 U/L — ABNORMAL HIGH (ref 38–126)
Anion gap: 7 (ref 5–15)
BUN: 11 mg/dL (ref 8–23)
CO2: 26 mmol/L (ref 22–32)
Calcium: 7.5 mg/dL — ABNORMAL LOW (ref 8.9–10.3)
Chloride: 103 mmol/L (ref 98–111)
Creatinine, Ser: 0.51 mg/dL (ref 0.44–1.00)
GFR calc Af Amer: >60 mL/min (ref >60)
GFR calc non Af Amer: >60 mL/min (ref >60)
Glucose, Bld: 104 mg/dL — ABNORMAL HIGH (ref 70–99)
Potassium: 2.9 mmol/L — ABNORMAL LOW (ref 3.5–5.1)
Sodium: 136 mmol/L (ref 135–145)
Total Bilirubin: 0.6 mg/dL (ref 0.3–1.2)
Total Protein: 5.2 g/dL — ABNORMAL LOW (ref 6.5–8.1)

## 2020-04-19 LAB — URINALYSIS, ROUTINE W REFLEX MICROSCOPIC
Bacteria, UA: NONE SEEN
Bilirubin Urine: NEGATIVE
Glucose, UA: NEGATIVE mg/dL
Ketones, ur: NEGATIVE mg/dL
Leukocytes,Ua: NEGATIVE
Nitrite: NEGATIVE
Protein, ur: NEGATIVE mg/dL
Specific Gravity, Urine: 1.015 (ref 1.005–1.030)
pH: 6 (ref 5.0–8.0)

## 2020-04-19 LAB — CBC
HCT: 24.6 % — ABNORMAL LOW (ref 36.0–46.0)
Hemoglobin: 7.1 g/dL — ABNORMAL LOW (ref 12.0–15.0)
MCH: 31.4 pg (ref 26.0–34.0)
MCHC: 28.9 g/dL — ABNORMAL LOW (ref 30.0–36.0)
MCV: 108.8 fL — ABNORMAL HIGH (ref 80.0–100.0)
Platelets: 515 10*3/uL — ABNORMAL HIGH (ref 150–400)
RBC: 2.26 MIL/uL — ABNORMAL LOW (ref 3.87–5.11)
RDW: 15.6 % — ABNORMAL HIGH (ref 11.5–15.5)
WBC: 8.7 10*3/uL (ref 4.0–10.5)
nRBC: 0 % (ref 0.0–0.2)

## 2020-04-19 MED ORDER — IOHEXOL 300 MG/ML  SOLN
100.0000 mL | Freq: Once | INTRAMUSCULAR | Status: AC | PRN
  Administered 2020-04-19: 75 mL via INTRAVENOUS

## 2020-04-19 MED ORDER — LINACLOTIDE 145 MCG PO CAPS
145.0000 ug | ORAL_CAPSULE | Freq: Every day | ORAL | Status: DC
  Administered 2020-04-20 – 2020-04-21 (×2): 145 ug via ORAL
  Filled 2020-04-19 (×2): qty 1

## 2020-04-19 MED ORDER — POTASSIUM CHLORIDE 10 MEQ/100ML IV SOLN
10.0000 meq | INTRAVENOUS | Status: AC
  Administered 2020-04-19 – 2020-04-20 (×4): 10 meq via INTRAVENOUS
  Filled 2020-04-19 (×2): qty 100

## 2020-04-19 MED ORDER — SODIUM CHLORIDE 0.9 % IV SOLN
1.0000 g | INTRAVENOUS | Status: DC
  Administered 2020-04-20 – 2020-04-21 (×2): 1000 mg via INTRAVENOUS
  Filled 2020-04-19 (×3): qty 1

## 2020-04-19 MED ORDER — SODIUM CHLORIDE 0.9% IV SOLUTION: Freq: Once | INTRAVENOUS | Status: DC

## 2020-04-19 MED ORDER — BOOST / RESOURCE BREEZE PO LIQD CUSTOM
1.0000 | Freq: Three times a day (TID) | ORAL | Status: DC
  Administered 2020-04-19 – 2020-04-21 (×5): 1 via ORAL

## 2020-04-19 NOTE — Progress Notes (Signed)
Pharmacy Antibiotic Note  Rachael Johnson is a 69 y.o. female admitted on 04/16/2020 with pneumonia and decubitus ulcer/osteo.  Pharmacy has been consulted for Vancomycin and ertapenem dosing by Dr Roderic Palau with oversight from Dr [place] B[place].  Plan: Continue Vancomycin 1250 mg IV every 24 hours. Start ertapenem 1gm iv q24h Moniotr labs, c/s, and vanco level as indicated. F/U ID recommendations on antibiotics  Height: 5\' 4"  (162.6 cm) Weight: 62.8 kg (138 lb 7.2 oz) IBW/kg (Calculated) : 54.7  Temp (24hrs), Avg:98 F (36.7 C), Min:98 F (36.7 C), Max:98 F (36.7 C)  Recent Labs  Lab 04/16/20 2038 04/16/20 2116 04/17/20 0013 04/17/20 0630 04/18/20 0812 04/19/20 1101  WBC 13.4*  --   --  11.8* 12.2* 8.7  CREATININE 1.06*  --   --  0.82 0.70 0.51  LATICACIDVEN  --  1.2 2.1*  --   --   --     Estimated Creatinine Clearance: 58.1 mL/min (by C-G formula based on SCr of 0.51 mg/dL).    No Known Allergies  Antimicrobials this admission: Vanco 9/17>> Zosyn 9/17>>9/20 Ertapenem 9/20 >>  9/17 BCx: ngtd Wound Cx: pending Sputum Cx: pending 9/17: COVID: neg  Thank you for allowing pharmacy to be a part of this patient's care.  Donna Christen Hamza Empson 04/19/2020 6:20 PM

## 2020-04-19 NOTE — Progress Notes (Signed)
Pharmacy Antibiotic Note  Rachael Johnson is a 69 y.o. female admitted on 04/16/2020 with pneumonia and decubitus ulcer/osteo.  Pharmacy has been consulted for Vancomycin and Zosyn dosing.  Plan: Continue Vancomycin 1250 mg IV every 24 hours. Continue Zosyn 3.375g IV every 8 hours. Moniotr labs, c/s, and vanco level as indicated. F/U ID recommendations on antibiotics  Height: 5\' 4"  (162.6 cm) Weight: 62.8 kg (138 lb 7.2 oz) IBW/kg (Calculated) : 54.7  Temp (24hrs), Avg:98.3 F (36.8 C), Min:98 F (36.7 C), Max:98.8 F (37.1 C)  Recent Labs  Lab 04/16/20 2038 04/16/20 2116 04/17/20 0013 04/17/20 0630 04/18/20 0812  WBC 13.4*  --   --  11.8* 12.2*  CREATININE 1.06*  --   --  0.82 0.70  LATICACIDVEN  --  1.2 2.1*  --   --     Estimated Creatinine Clearance: 58.1 mL/min (by C-G formula based on SCr of 0.7 mg/dL).    No Known Allergies  Antimicrobials this admission: Vanco 9/17>> Zosyn 9/17>>  9/17 BCx: ngtd Wound Cx: pending Sputum Cx: pending 9/17: COVID: neg  Thank you for allowing pharmacy to be a part of this patient's care.  Ramond Craver 04/19/2020 11:04 AM

## 2020-04-19 NOTE — Progress Notes (Signed)
PROGRESS NOTE    Rachael Johnson  ZDG:[id number] DOB: 1950-08-13 DOA: 04/16/2020 PCP: Mosie Lukes, MD    Brief Narrative:   Rachael Johnson  is a 69 y.o. female, with history of CVA with residual left sided weakness, thyroid disease, seizure, rhabdo, restless leg syndrome, GERD, COPD, and more presents to the ED with a chief complaint of painful ulcer. Patient reports that the ulcer started 2 months ago. She says that she has not had any previous treatments for it. It started out small, and has worsened in size and pain over the two months. She reports that propping herself up with a pillow makes the pain better. Palpating or laying supine make it worse. Patient reports that since her stroke she has only been able to ambulate with a wheelchair. She is not sure when, but the wound has started draining brown, malodorous fluid. Patient reports that she has been feverish with Tmax 102. She has not taken any antipyretics. Patient reports that she has had a cough that also started two months ago. The cough has been improving recently. The cough has been non productive. She reports that she has had dyspnea, but has not had to use more than her 4LNC that is her baseline. She reports no chest pain, palpitations, or peripheral edema. She reports a normal appetite, and normal water intake. Patient does report painful mouth and blisters. She does use steroid inhalers at home.   Assessment & Plan:   Principal Problem:   Sacral decubitus ulcer, stage IV (HCC) Active Problems:   Thrombocythemia ([place])   Hypothyroid   Goals of care, counseling/discussion   COPD (chronic obstructive pulmonary disease) ([place])   Osteomyelitis (HCC)   Decubitus ulcer of left heel, unstageable ([place])   Acute encephalopathy   Palliative care by specialist   1.  Infected sacral decubitus ulcer.  Seen by general surgery and debridement done at bedside.  She is on broad-spectrum antibiotics.  Imaging indicates underlying  osteomyelitis.    Discussed with Dr West Bali with infectious disease with recommendations for 6 weeks of IV vancomycin and ertapenem.    Wound culture requested  2.  Possible pneumonia.  She is on broad-spectrum antibiotics.    Blood cultures have shown no growth to date.  Check CT chest  3.  Sepsis.  Likely related to #1 and #2.  Vitals appear to be improving.  Continue antibiotics.  Blood cultures have shown no growth to date  4.  Recently diagnosed superficial thrombus.  Followed by oncology and started on Xarelto.  Plan is for 2 months of anticoagulation.  5.  Essential thrombocythemia.  She is chronically on interferon through oncology.  Platelet counts do not appear to be substantially elevated.  6.  Failure to thrive with severe protein calorie malnutrition.  Albumin of 2.0, I suspect this will continue to decline with IV hydration.  Nutrition consult.  7.  Hypokalemia.  Replaced  8.  Acute encephalopathy.  Metabolic work-up is unrevealing.  She does not appear to have any focal deficits.  After discussing with patient's son, appears to be a more chronic process.  Likely has some degree of mild cognitive impairment at baseline and symptoms got substantially worse after recent prolonged admission for pneumonia.  9.  COPD.  No complaints of shortness of breath at this time.  10.  Constipation.  Provided enema.  Started on Linzess.  11.  Urinary retention.  Likely related to constipation.  Continue Foley catheter.  12.  Goals of care.  Long discussion with patient's son [name].  Patient has had a significant decline over the past 2 months.  It appears that she did have some mild cognitive impairment prior to this, but was functional and active.  Since her admission approximately 2 months ago for pneumonia which was prolonged, she has had a consistent decline.  Her p.o. intake has been poor and she has been losing weight.  She is developed a significant sacral ulcer that will  be very difficult to heal considering her poor nutritional status.  Palliative care consulted with plans for family meeting on 9/21.  Long-term prognosis is poor.  13.  Anemia, likely related to nutritional deficiencies.  No obvious bleeding.  Check stool for occult blood.  Check ferritin and serum iron.  Anemia is macrocytic.  Check folic acid.  B12 level is not low.  Will transfuse 1 unit of PRBC.   DVT prophylaxis: rivaroxaban (XARELTO) tablet 10 mg Start: 04/17/20 1000 SCDs Start: 04/17/20 0148 rivaroxaban (XARELTO) tablet 10 mg  Code Status: Full code Family Communication: Discussed with patient's son, [name] 9/20 Disposition Plan: Status is: Inpatient  Remains inpatient appropriate because:Inpatient level of care appropriate due to severity of illness   Dispo: The patient is from: Home              Anticipated d/c is to: TBD              Anticipated d/c date is: 2 days              Patient currently is not medically stable to d/c.   Consultants:   General surgery  Procedures:   Bedside debridement of sacral wound  Antimicrobials:   Vancomycin 9/18 >  Zosyn 9/18 > 9/20  Ertapenem 9/20>   Subjective: Patient remains confused.  She does not know that she is in the hospital.  Objective: Vitals:   04/18/20 2038 04/19/20 0404 04/19/20 0408 04/19/20 0740  BP:  116/68    Pulse: (!) 105 (!) 103    Resp:  20    Temp:  98 F (36.7 C)    TempSrc:      SpO2:  97%  97%  Weight:   62.8 kg   Height:        Intake/Output Summary (Last 24 hours) at 04/19/2020 1803 Last data filed at 04/19/2020 0421 Gross per 24 hour  Intake 1525.8 ml  Output 600 ml  Net 925.8 ml   Filed Weights   04/17/20 0329 04/18/20 0413 04/19/20 0408  Weight: 64.4 kg 60.4 kg 62.8 kg    Examination:  General exam: Alert, awake, pallor Respiratory system: Clear to auscultation. Respiratory effort normal. Cardiovascular system:RRR. No murmurs, rubs, gallops. Gastrointestinal system:  Abdomen is nondistended, soft and nontender. No organomegaly or masses felt. Normal bowel sounds heard. Central nervous system:  No focal neurological deficits. Extremities: No C/C/E, +pedal pulses Skin: No rashes, lesions or ulcers Psychiatry: confused     Data Reviewed: I have personally reviewed following labs and imaging studies  CBC: Recent Labs  Lab 04/16/20 2038 04/17/20 0630 04/18/20 0812 04/19/20 1101  WBC 13.4* 11.8* 12.2* 8.7  NEUTROABS 11.4* 10.0*  --   --   HGB 9.3* 8.6* 8.4* 7.1*  HCT 31.0* 29.1* 28.2* 24.6*  MCV 106.2* 107.0* 108.0* 108.8*  PLT 475* 427* 451* 250*   Basic Metabolic Panel: Recent Labs  Lab 04/16/20 1948 04/16/20 2038 04/17/20 0630 04/18/20 0812 04/19/20 1101  NA  --  139 138 139  136  K  --  2.4* 3.6 3.3* 2.9*  CL  --  96* 99 102 103  CO2  --  33* 31 27 26   GLUCOSE  --  103* 106* 101* 104*  BUN  --  30* 25* 18 11  CREATININE  --  1.06* 0.82 0.70 0.51  CALCIUM  --  8.8* 8.2* 8.2* 7.5*  MG 2.1  --  2.0  --   --   PHOS  --   --  2.8  --   --    GFR: Estimated Creatinine Clearance: 58.1 mL/min (by C-G formula based on SCr of 0.51 mg/dL). Liver Function Tests: Recent Labs  Lab 04/16/20 2038 04/17/20 0630 04/18/20 0812 04/19/20 1101  AST 78* 72* 58* 56*  ALT 27 25 24 24   ALKPHOS 182* 156* 161* 166*  BILITOT 1.1 0.7 0.8 0.6  PROT 7.2 6.4* 5.9* 5.2*  ALBUMIN 2.5* 2.2* 2.0* 1.8*   No results for input(s): LIPASE, AMYLASE in the last 168 hours. Recent Labs  Lab 04/18/20 0812  AMMONIA 18   Coagulation Profile: No results for input(s): INR, PROTIME in the last 168 hours. Cardiac Enzymes: No results for input(s): CKTOTAL, CKMB, CKMBINDEX, TROPONINI in the last 168 hours. BNP (last 3 results) No results for input(s): PROBNP in the last 8760 hours. HbA1C: No results for input(s): HGBA1C in the last 72 hours. CBG: Recent Labs  Lab 04/18/20 2026  GLUCAP 103*   Lipid Profile: No results for input(s): CHOL, HDL, LDLCALC,  TRIG, CHOLHDL, LDLDIRECT in the last 72 hours. Thyroid Function Tests: Recent Labs    04/18/20 0812  TSH 4.776*   Anemia Panel: Recent Labs    04/18/20 0812  VITAMINB12 736   Sepsis Labs: Recent Labs  Lab 04/16/20 2116 04/17/20 0013  LATICACIDVEN 1.2 2.1*    Recent Results (from the past 240 hour(s))  SARS Coronavirus 2 by RT PCR (hospital order, performed in [facility] hospital lab) Nasopharyngeal Nasopharyngeal Swab     Status: None   Collection Time: 04/16/20  7:39 PM   Specimen: Nasopharyngeal Swab  Result Value Ref Range Status   SARS Coronavirus 2 NEGATIVE NEGATIVE Final    Comment: (NOTE) SARS-CoV-2 target nucleic acids are NOT DETECTED.  The SARS-CoV-2 RNA is generally detectable in upper and lower respiratory specimens during the acute phase of infection. The lowest concentration of SARS-CoV-2 viral copies this assay can detect is 250 copies / mL. A negative result does not preclude SARS-CoV-2 infection and should not be used as the sole basis for treatment or other patient management decisions.  A negative result may occur with improper specimen collection / handling, submission of specimen other than nasopharyngeal swab, presence of viral mutation(s) within the areas targeted by this assay, and inadequate number of viral copies (<250 copies / mL). A negative result must be combined with clinical observations, patient history, and epidemiological information.  Fact Sheet for Patients:   StrictlyIdeas.no  Fact Sheet for Healthcare Providers: BankingDealers.co.za  This test is not yet approved or  cleared by the [place] FDA and has been authorized for detection and/or diagnosis of SARS-CoV-2 by FDA under an Emergency Use Authorization (EUA).  This EUA will remain in effect (meaning this test can be used) for the duration of the COVID-19 declaration under Section 564(b)(1) of the Act, 21 U.S.C. section  360bbb-3(b)(1), unless the authorization is terminated or revoked sooner.  Performed at [facility], [address] [place], [place] [address]   Culture, blood (routine  x 2)     Status: None (Preliminary result)   Collection Time: 04/16/20  9:16 PM   Specimen: Right Antecubital; Blood  Result Value Ref Range Status   Specimen Description RIGHT ANTECUBITAL  Final   Special Requests   Final    BOTTLES DRAWN AEROBIC AND ANAEROBIC Blood Culture adequate volume   Culture   Final    NO GROWTH 3 DAYS Performed at [facility], [address] [place], [place] [address]    Report Status PENDING  Incomplete  Culture, blood (routine x 2)     Status: None (Preliminary result)   Collection Time: 04/16/20  9:25 PM   Specimen: BLOOD RIGHT HAND  Result Value Ref Range Status   Specimen Description BLOOD RIGHT HAND  Final   Special Requests   Final    BOTTLES DRAWN AEROBIC AND ANAEROBIC Blood Culture adequate volume   Culture   Final    NO GROWTH 3 DAYS Performed at [facility], [address], [place], [place] [address]    Report Status PENDING  Incomplete         Radiology Studies: No results found.      Scheduled Meds: . sodium chloride   Intravenous Once  . atorvastatin  10 mg Oral Daily  . busPIRone  15 mg Oral BID  . Chlorhexidine Gluconate Cloth  6 each Topical Daily  . collagenase   Topical BID  . famotidine  20 mg Oral BID  . feeding supplement (ENSURE ENLIVE)  237 mL Oral BID BM  . fluticasone  1 spray Each Nare Daily  . gabapentin  400 mg Oral QHS  . levothyroxine  100 mcg Oral Q0600  . mometasone-formoterol  2 puff Inhalation BID  . montelukast  10 mg Oral QHS  . multivitamin-lutein  1 capsule Oral QHS  . rivaroxaban  10 mg Oral Daily  . traZODone  100 mg Oral QHS  . venlafaxine XR  75 mg Oral Q breakfast   Continuous Infusions: . sodium chloride 100 mL/hr at 04/18/20 1805  . piperacillin-tazobactam (ZOSYN)  IV 3.375 g (04/19/20 1445)  . potassium chloride     . vancomycin 1,250 mg (04/18/20 2015)     LOS: 2 days    Time spent: 44mins    Kathie Dike, MD [facility]   If 7PM-7AM, please contact night-coverage [email or web address]  04/19/2020, 6:03 PM

## 2020-04-19 NOTE — Consult Note (Signed)
Consultation Note Date: 04/19/2020   Patient Name: Rachael Johnson  DOB: Mar 16, 1951  MRN: [id number]  Age / Sex: 69 y.o., female  PCP: Mosie Lukes, MD Referring Physician: Kathie Dike, MD  Reason for Consultation: Establishing goals of care  HPI/Patient Profile: 69 y.o. female  with past medical history of thyroid disease, seizures, restless leg syndrome, GERD, COPD, CVA , essential thrombocythemia followed by oncology and receiving interferon, recent [facility] hospitalization for pneumonia admitted on 04/16/2020 with sacral wound. Hospital admission for infected sacral decubitus ulcer concerning for osteomyelitis. General surgery following s/p debridement. Possible pneumonia. Also with acute encephalopathy with work-up unrevealing. Dr Roderic Palau spoke with son who reports some degree of mild cognitive impairment at baseline with worsening mental status following prolonged hospitalization for pneumonia. Poor long-term prognosis with failure to thrive, severe protein calorie malnutrition, decubitus ulcer with osteomyelitis, albumin of 2.0. Palliative medicine consultation for goals of care.   Clinical Assessment and Goals of Care: PMT consult received, discussed in detail with Dr. Roderic Palau, and visited patient at bedside. Rachael Johnson is resting comfortably. She does wake to voice but is pleasantly confused. No s/s of pain or discomfort. No family at bedside.   Spoke with son, [name] via telephone to arrange [place] meeting.   Patient has a documented living will. Husband deceased. Joey and his step-brother Sherren Mocha are documented HCPOA's. Since the passing of her husband, Sherren Mocha has been estranged from [place] [name] does have one sister, Hope. Hope has been living with patient for the last few months, helping care for her.   Joey reports that two months ago his mother was active and in good health. He had to "tell her to slow down."  Discussed recent hospitalization for pneumonia and progressive decline since that admission with weight loss, poor oral intake, some cognitive impairment/forgetfulness.   Discussed events leading up to admission and course of hospitalization including diagnoses, interventions, plan of care. Discussed Dr. Blythe Stanford concerns with how well she will do moving forward and importance of discussing goals of care.   Joey and I plan to meet tomorrow 9/21 at 10am to discuss goals of care. He will bring documented HCPOA paperwork and reports his mother has a desire for natural death document too. We will review tomorrow.   Therapeutic listening and emotional support provided. Will plan to meet son in AM.     SUMMARY OF RECOMMENDATIONS    Continue current plan of care and medical management.  Son, [name] is documented HCPOA.  [place] meeting with [place] tomorrow 9/21 at 10am.   Code Status/Advance Care Planning:  Full code  Symptom Management:   Per attending  Palliative Prophylaxis:   Aspiration, Delirium Protocol, Frequent Pain Assessment, Oral Care, Palliative Wound Care and Turn Reposition  Psycho-social/Spiritual:   Desire for further Chaplaincy support: yes  Additional Recommendations: Caregiving  Support/Resources, Compassionate Wean Education and Education on Hospice  Prognosis:   Poor long-term  Discharge Planning: To Be Determined      Primary Diagnoses: Present on Admission: . Osteomyelitis ([place]) .  COPD (chronic obstructive pulmonary disease) ([place]) . Hypothyroid . Thrombocythemia ([place])   I have reviewed the medical record, interviewed the patient and family, and examined the patient. The following aspects are pertinent.  Past Medical History:  Diagnosis Date  . Acute renal insufficiency 04/09/2017  . Anemia, iron deficiency 07/08/2012  . Arthritis   . Cataract 12/21/2014  . COPD (chronic obstructive pulmonary disease) ([place]) 01/23/2017   01/23/2017   Walked RA  2  laps @ 185 ft each stopped due to  Cough > > sob/ no desat nl pace  . Depression with anxiety 03/24/2011  . Diverticulitis   . Esophageal reflux 08/17/2013  . Essential thrombocythemia ([place]) 01/24/2011  . Frequent episodic tension-type headache   . Gastroenteritis 12/21/2014  . 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 GERD (gastroesophageal reflux disease)   . H. pylori infection 12/19/2012  . Hyperglycemia 12/17/2015  . Hypertension   . Iron deficiency anemia due to chronic blood loss 04/03/2017  . Osteoarthritis 05/24/2017  . Other and unspecified hyperlipidemia 02/25/2013  . Restless leg syndrome 09/30/2014  . Rhabdomyolysis 02/2017  . Scabies 03/28/2015  . 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 Seizure ([place])    childhood  . Shoulder wound, right, sequela 03/14/2017  . Thyroid disease    Social History   Socioeconomic History  . Marital status: Widowed    Spouse name: Not on file  . Number of children: 2  . Years of education: Not on file  . Highest education level: Not on file  Occupational History  . Occupation: APL IT sales professional: [employer]    Comment: [employer]  Tobacco Use  . Smoking status: Former Smoker    Packs/day: 3.50    Years: 20.00    Pack years: 70.00    Types: Cigarettes    Start date: 11/15/1970    Quit date: 07/31/1990    Years since quitting: 29.7  . Smokeless tobacco: Never Used  . Tobacco comment: quit 23 years ago  Vaping Use  . Vaping Use: Never used  Substance and Sexual Activity  . Alcohol use: No    Alcohol/week: 0.0 standard drinks  . Drug use: No  . Sexual activity: Never  Other Topics Concern  . Not on file  Social History Narrative   Regular exercise: no   Caffeine Use: 2-3 weekly   Social Determinants of Health   Financial Resource Strain: Low Risk   . Difficulty of Paying Living Expenses: Not hard at all  Food Insecurity: No Food Insecurity  . Worried About Charity fundraiser in the Last Year: Never true  . Ran Out of Food in the Last  Year: Never true  Transportation Needs: No Transportation Needs  . Lack of Transportation (Medical): No  . Lack of Transportation (Non-Medical): No  Physical Activity:   . Days of Exercise per Week: Not on file  . Minutes of Exercise per Session: Not on file  Stress:   . Feeling of Stress : Not on file  Social Connections:   . Frequency of Communication with Friends and Family: Not on file  . Frequency of Social Gatherings with Friends and Family: Not on file  . Attends Religious Services: Not on file  . Active Member of Clubs or Organizations: Not on file  . Attends Archivist Meetings: Not on file  . Marital Status: Not on file   Family History  Problem Relation Age of Onset  . Arthritis Mother   . Cancer Mother  ovarian  . Hypertension Mother   . Heart disease Mother        pacer  . Heart failure Mother   . Arthritis Father   . Cancer Father        lung  . Hypertension Father   . Cancer Sister        lung  . Cancer Brother        prostate  . Cancer Brother        lung  . Hypertension Son   . COPD Brother   . Heart disease Brother        died from CHF  . Hypertension Brother   . Cancer Brother        colon  . Alcohol abuse Brother   . Cirrhosis Brother   . Seizures Sister   . Stroke Sister   . Arthritis Brother    Scheduled Meds: . atorvastatin  10 mg Oral Daily  . busPIRone  15 mg Oral BID  . Chlorhexidine Gluconate Cloth  6 each Topical Daily  . collagenase   Topical BID  . famotidine  20 mg Oral BID  . feeding supplement (ENSURE ENLIVE)  237 mL Oral BID BM  . fluticasone  1 spray Each Nare Daily  . gabapentin  400 mg Oral QHS  . levothyroxine  100 mcg Oral Q0600  . mometasone-formoterol  2 puff Inhalation BID  . montelukast  10 mg Oral QHS  . multivitamin-lutein  1 capsule Oral QHS  . rivaroxaban  10 mg Oral Daily  . traZODone  100 mg Oral QHS  . venlafaxine XR  75 mg Oral Q breakfast   Continuous Infusions: . sodium chloride 100  mL/hr at 04/18/20 1805  . piperacillin-tazobactam (ZOSYN)  IV 3.375 g (04/19/20 0421)  . vancomycin 1,250 mg (04/18/20 2015)   PRN Meds:.acetaminophen **OR** acetaminophen, albuterol, ALPRAZolam, magic mouthwash w/lidocaine, morphine injection, ondansetron **OR** ondansetron (ZOFRAN) IV, oxyCODONE, polyethylene glycol Medications Prior to Admission:  Prior to Admission medications   Medication Sig Start Date End Date Taking? Authorizing Provider  atorvastatin (LIPITOR) 10 MG tablet Take 1 tablet (10 mg total) by mouth daily. 05/30/19  Yes Mosie Lukes, MD  albuterol (VENTOLIN HFA) 108 (90 Base) MCG/ACT inhaler INHALE 2 PUFFS BY MOUTH EVERY 6 HOURS AS NEEDED FOR WHEEZING OR SHORTNESS OF BREATH 06/09/19   Mosie Lukes, MD  ALLER-CHLOR 4 MG tablet TAKE 2 TABLETS BY MOUTH EVERY NIGHT AT BEDTIME AS NEEDED FOR ALLERGIES Patient taking differently: Take 2 mg by mouth at bedtime as needed for allergies.  02/13/20   Mosie Lukes, MD  ALPRAZolam Duanne Moron) 1 MG tablet TAKE 1 TABLET BY MOUTH THREE TIMES DAILY AS NEEDED FOR ANXIETY Patient taking differently: Take 1 mg by mouth 3 (three) times daily as needed for anxiety.  02/13/20   Mosie Lukes, MD  alum & mag hydroxide-simeth (GELUSIL) 200-200-25 MG chewable tablet Chew by mouth. 04/08/19   [provider]  B-D 3CC LUER-LOK SYR 25GX1" 25G X 1" 3 ML MISC Inject into the skin. 02/23/20   [provider]  budesonide-formoterol (SYMBICORT) 80-4.5 MCG/ACT inhaler Inhale 2 puffs into the lungs 2 (two) times daily. 06/09/19   Mosie Lukes, MD  busPIRone (BUSPAR) 15 MG tablet TAKE (1) TABLET BY MOUTH FOUR TIMES A DAY AS NEEDED Patient taking differently: Take 15 mg by mouth 4 (four) times daily as needed.  12/12/19   Mosie Lukes, MD  Cranberry 300 MG tablet Take 300 mg  by mouth 2 (two) times daily.    [provider]  cyanocobalamin (,VITAMIN B-12,) 1000 MCG/ML injection Inject 1 ml into muscle q week x 4 then q monthly Patient  taking differently: Inject 1,000 mcg into the muscle every 30 (thirty) days.  10/31/19   [name], MD  cyclobenzaprine (FLEXERIL) 10 MG tablet Take 1 tablet (10 mg total) by mouth 2 (two) times daily as needed for muscle spasms. 01/30/20   Mosie Lukes, MD  diphenhydrAMINE (EQ ALLERGY RELIEF CHILDRENS) 12.5 MG/5ML liquid TAKE 5 ML BY MOUTH THREE TIMES DAILY AS NEEDED FOR MOUTH  PAIN 10/10/19   Mosie Lukes, MD  estradiol (ESTRACE) 0.5 MG tablet TAKE ONE (1) TABLET BY MOUTH TWICE DAILY Patient taking differently: Take 0.5 mg by mouth in the morning and at bedtime.  01/12/20   Mosie Lukes, MD  famotidine (PEPCID) 20 MG tablet Take 20 mg by mouth 2 (two) times daily. 03/04/20   [provider]  famotidine (PEPCID) 40 MG tablet Take 1 tablet (40 mg total) by mouth daily. Patient not taking: Reported on 04/16/2020 05/30/19   Mosie Lukes, MD  fluconazole (DIFLUCAN) 150 MG tablet TAKE (1) TABLET BY MOUTH ONCE WEEKLY Patient taking differently: Take 150 mg by mouth once a week.  12/23/19   Mosie Lukes, MD  fluticasone (FLONASE) 50 MCG/ACT nasal spray INSTILL TWO (2) SPRAYS IN EACH NOSTRIL ONCE DAILY 08/21/19   Mosie Lukes, MD  furosemide (LASIX) 20 MG tablet TAKE 1 TABLET BY MOUTH 3 TIMES DAILY AS NEEDED FOR EDEMA Patient taking differently: Take 20 mg by mouth 3 (three) times daily as needed for fluid.  09/26/19   Mosie Lukes, MD  gabapentin (NEURONTIN) 100 MG capsule TAKE FOUR (4) CAPSULES BY MOUTH EVERY NIGHT AT BEDTIME Patient taking differently: Take 400 mg by mouth at bedtime.  09/26/19   Mosie Lukes, MD  gabapentin (NEURONTIN) 400 MG capsule Take 1 capsule (400 mg total) by mouth daily. Patient not taking: Reported on 04/16/2020 05/30/19   Mosie Lukes, MD  granisetron (SANCUSO) 3.1 MG/24HR APPLY 1 PATCH TOPICALLY AS NEEDED FOR NAUSEA, REMOVE AFTER 7 DAYS Patient taking differently: Place 1 patch onto the skin every 7 (seven) days.  10/08/19   [name], MD    levothyroxine (SYNTHROID) 100 MCG tablet TAKE 1 TABLET BY MOUTH EVERY MORNING BEFORE BREAKFAST Patient taking differently: Take 100 mcg by mouth daily before breakfast.  01/12/20   Mosie Lukes, MD  magic mouthwash SOLN Take 5 mLs by mouth 3 (three) times daily as needed for mouth pain. Components benadryl  525 mg, hydrocortisone 60 mg and nystatin 0.6 mg. 240 ml - Oral 04/08/19   Ennever, Rudell Cobb, MD  meclizine (ANTIVERT) 12.5 MG tablet TAKE 1 TABLET BY MOUTH THREE TIMES DAILY AS NEEDED FOR DIZZINESS Patient taking differently: Take 12.5 mg by mouth 3 (three) times daily as needed for dizziness.  09/26/19   Mosie Lukes, MD  montelukast (SINGULAIR) 10 MG tablet TAKE 1 TABLET BY MOUTH AT BEDTIME Patient taking differently: Take 10 mg by mouth at bedtime.  12/04/19   Mosie Lukes, MD  Multiple Vitamin (MULTIVITAMIN PO) Take 1 tablet by mouth every morning.     [provider]  Nebulizers MISC USE AS DIRECTED 02/16/20   [provider]  nystatin (MYCOSTATIN) 100000 UNIT/ML suspension Take 5 mLs (500,000 Units total) by mouth 4 (four) times daily. 06/13/19   Mosie Lukes,  MD  ondansetron (ZOFRAN) 8 MG tablet TAKE 1/2-1 TABLET BY MOUTH EVERY 8 HOURS AS NEEDED FOR NAUSEA AND VOMITING Patient taking differently: Take 4-8 mg by mouth every 8 (eight) hours as needed for nausea or vomiting.  08/15/19   Mosie Lukes, MD  oxyCODONE-acetaminophen (PERCOCET) 10-325 MG tablet Take 1 tablet by mouth every 6 (six) hours as needed for pain. 03/30/20   Mosie Lukes, MD  peginterferon alfa-2a (PEGASYS) 180 MCG/ML injection Inject 0.75 mLs (135 mcg total) into the skin every 7 (seven) days. 03/12/20   [name], MD  RESTASIS 0.05 % ophthalmic emulsion PLACE ONE DROP INTO [facility] EYE EVERY 12 HOURS 06/09/19   Mosie Lukes, MD  rivaroxaban (XARELTO) 10 MG TABS tablet Take 1 tablet (10 mg total) by mouth daily. 03/18/20   [name], MD  sodium chloride HYPERTONIC 3 % nebulizer  solution Take by nebulization daily. 02/16/20   Rigoberto Noel, MD  sodium chloride, hypertonic, 3 % solution SMARTSIG:Via Nebulizer 02/16/20   [provider]  Spacer/Aero-Holding Chambers ([place]) MISC USE as directed. Use with inhaler 05/13/19   Mosie Lukes, MD  SSD 1 % cream APPLY TOPICALLY DAILY 03/31/19   Mosie Lukes, MD  SYRINGE-NEEDLE, DISP, 3 ML (B-D 3CC LUER-LOK SYR 25GX1") 25G X 1" 3 ML MISC 1 Syringe by Subdermal route once a week. 12/02/19   [provider]  traZODone (DESYREL) 100 MG tablet TAKE 1 TABLET BY MOUTH AT BEDTIME Patient taking differently: Take 100 mg by mouth at bedtime.  09/26/19   Mosie Lukes, MD  TUBERCULIN SYR 1CC/27GX1/2" (B-D TB SYRINGE 1CC/27GX1/2") 27G X 1/2" 1 ML MISC USE AS DIRECTED ONCE WEEKLY FOR PEG-INTERFERON INJECTIONS 09/09/19   [name], MD  venlafaxine XR (EFFEXOR-XR) 75 MG 24 hr capsule TAKE 1 CAPSULE BY MOUTH THREE TIMES DAILY Patient taking differently: Take 75 mg by mouth in the morning, at noon, and at bedtime.  09/26/19   Mosie Lukes, MD   No Known Allergies Review of Systems  Unable to perform ROS: Mental status change   Physical Exam Vitals and nursing note reviewed.  Constitutional:      Appearance: She is cachectic. She is ill-appearing.  Pulmonary:     Effort: No tachypnea, accessory muscle usage or respiratory distress.  Neurological:     Mental Status: She is easily aroused.     Comments: Pleasant confusion    Vital Signs: BP 116/68 (BP Location: Left Arm)   Pulse (!) 103   Temp 98 F (36.7 C)   Resp 20   Ht 5\' 4"  (1.626 m)   Wt 62.8 kg   LMP 07/31/1990   SpO2 97%   BMI 23.76 kg/m  Pain Scale: 0-10   Pain Score: Asleep   SpO2: SpO2: 97 % O2 Device:SpO2: 97 % O2 Flow Rate: .O2 Flow Rate (L/min): 1.5 L/min  IO: Intake/output summary:   Intake/Output Summary (Last 24 hours) at 04/19/2020 1405 Last data filed at 04/19/2020 0421 Gross per 24 hour  Intake 1525.8 ml    Output 600 ml  Net 925.8 ml    LBM: Last BM Date: 04/18/20 Baseline Weight: Weight: 64.4 kg Most recent weight: Weight: 62.8 kg     Palliative Assessment/Data: PPS 40%    Time Total: 49min Greater than 50%  of this time was spent counseling and coordinating care related to the above assessment and plan.  Signed by:  Ihor Dow, DNP, FNP-C Palliative Medicine Team  Phone: [phone] Fax: [phone]   Please contact Palliative Medicine Team phone at [phone] for questions and concerns.  For individual provider: See Shea Evans

## 2020-04-20 ENCOUNTER — Other Ambulatory Visit: Payer: Self-pay | Admitting: Family Medicine

## 2020-04-20 ENCOUNTER — Inpatient Hospital Stay: Payer: Self-pay

## 2020-04-20 DIAGNOSIS — R531 Weakness: Secondary | ICD-10-CM

## 2020-04-20 DIAGNOSIS — E43 Unspecified severe protein-calorie malnutrition: Secondary | ICD-10-CM

## 2020-04-20 DIAGNOSIS — Z7189 Other specified counseling: Secondary | ICD-10-CM

## 2020-04-20 DIAGNOSIS — R627 Adult failure to thrive: Secondary | ICD-10-CM

## 2020-04-20 DIAGNOSIS — Z515 Encounter for palliative care: Secondary | ICD-10-CM

## 2020-04-20 LAB — BASIC METABOLIC PANEL
Anion gap: 6 (ref 5–15)
BUN: 8 mg/dL (ref 8–23)
CO2: 25 mmol/L (ref 22–32)
Calcium: 7.6 mg/dL — ABNORMAL LOW (ref 8.9–10.3)
Chloride: 103 mmol/L (ref 98–111)
Creatinine, Ser: 0.41 mg/dL — ABNORMAL LOW (ref 0.44–1.00)
GFR calc Af Amer: >60 mL/min (ref >60)
GFR calc non Af Amer: >60 mL/min (ref >60)
Glucose, Bld: 85 mg/dL (ref 70–99)
Potassium: 3.3 mmol/L — ABNORMAL LOW (ref 3.5–5.1)
Sodium: 134 mmol/L — ABNORMAL LOW (ref 135–145)

## 2020-04-20 LAB — CBC
HCT: 25.4 % — ABNORMAL LOW (ref 36.0–46.0)
Hemoglobin: 7.6 g/dL — ABNORMAL LOW (ref 12.0–15.0)
MCH: 31.8 pg (ref 26.0–34.0)
MCHC: 29.9 g/dL — ABNORMAL LOW (ref 30.0–36.0)
MCV: 106.3 fL — ABNORMAL HIGH (ref 80.0–100.0)
Platelets: 554 10*3/uL — ABNORMAL HIGH (ref 150–400)
RBC: 2.39 MIL/uL — ABNORMAL LOW (ref 3.87–5.11)
RDW: 15.4 % (ref 11.5–15.5)
WBC: 7.5 10*3/uL (ref 4.0–10.5)
nRBC: 0 % (ref 0.0–0.2)

## 2020-04-20 LAB — HEMOGLOBIN AND HEMATOCRIT, BLOOD
HCT: 33.2 % — ABNORMAL LOW (ref 36.0–46.0)
Hemoglobin: 10.4 g/dL — ABNORMAL LOW (ref 12.0–15.0)

## 2020-04-20 LAB — FERRITIN: Ferritin: 1954 ng/mL — ABNORMAL HIGH (ref 11–307)

## 2020-04-20 LAB — FOLATE: Folate: 3.2 ng/mL — ABNORMAL LOW (ref >5.9)

## 2020-04-20 LAB — PREPARE RBC (CROSSMATCH)

## 2020-04-20 LAB — IRON AND TIBC
Iron: 31 ug/dL (ref 28–170)
Saturation Ratios: 35 % — ABNORMAL HIGH (ref 10.4–31.8)
TIBC: 89 ug/dL — ABNORMAL LOW (ref 250–450)
UIBC: 58 ug/dL

## 2020-04-20 MED ORDER — POTASSIUM CHLORIDE CRYS ER 20 MEQ PO TBCR: 40.0000 meq | EXTENDED_RELEASE_TABLET | Freq: Once | ORAL | Status: DC

## 2020-04-20 MED ORDER — HALOPERIDOL LACTATE 5 MG/ML IJ SOLN
5.0000 mg | Freq: Four times a day (QID) | INTRAMUSCULAR | Status: AC | PRN
  Administered 2020-04-21: 5 mg via INTRAVENOUS
  Filled 2020-04-20: qty 1

## 2020-04-20 NOTE — Plan of Care (Signed)

## 2020-04-20 NOTE — Progress Notes (Signed)
Received order for PICC  

## 2020-04-20 NOTE — Care Management Important Message (Signed)
Important Message  Patient Details  Name: Rachael Johnson MRN: [id number] Date of Birth: [birth year]   Medicare Important Message Given:  Yes     Tommy Medal 04/20/2020, 4:31 PM

## 2020-04-20 NOTE — NC FL2 (Signed)
[place] LEVEL OF CARE SCREENING TOOL     IDENTIFICATION  Patient Name: JAMILETTE SUCHOCKI Birthdate: 02-20-1951 Sex: female Admission Date (Current Location): 04/16/2020  [facility] and [place] Number:  [employer] and Address:  [place] [address], [place]      Provider Number: [id number]  Attending Physician Name and Address:  Kathie Dike, MD  Relative Name and Phone Number:  [name] (son) Ph: [phone]    Current Level of Care: Hospital Recommended Level of Care: [place] Prior Approval Number:    Date Approved/Denied:   PASRR Number: SNF to complete if stay is longer than 30 days due to anxiety/depression  Discharge Plan: SNF    Current Diagnoses: Patient Active Problem List   Diagnosis Date Noted  . Severe protein-calorie malnutrition ([place])   . Adult failure to thrive   . Weakness   . Acute encephalopathy   . Palliative care by specialist   . Osteomyelitis ([place]) 04/17/2020  . Sacral decubitus ulcer, stage IV ([place]) 04/17/2020  . Decubitus ulcer of left heel, unstageable ([place]) 04/17/2020  . Pernicious anemia 10/31/2019  . Hypokalemia 10/16/2019  . Insomnia 02/05/2019  . Urinary frequency 06/19/2018  . 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 Sinusitis 11/05/2017  . Chronic back pain 09/09/2017  . Osteoarthritis 05/24/2017  . Iron deficiency anemia due to chronic blood loss 04/03/2017  . Hypomagnesemia 03/18/2017  . Right shoulder pain 03/14/2017  . LFTs abnormal 03/13/2017  . Hypernatremia 03/13/2017  . Elevated LFTs   . COPD (chronic obstructive pulmonary disease) ([place]) 01/23/2017  . Lung nodule 12/18/2016  . Bronchiectasis assoc with MPNs 11/28/2016  . Cough variant asthma vs UACS 11/27/2016  . Vitamin D deficiency 03/07/2016  . Dysuria 03/07/2016  . Essential hypertension 03/07/2016  . Benign paroxysmal positional vertigo 03/07/2016  . Hyperglycemia 12/17/2015  .  Cataract 12/21/2014  . Restless leg syndrome 09/30/2014  . Allergies 04/27/2014  . Constipation 11/24/2013  . Goals of care, counseling/discussion 04/20/2013  . Hyperlipidemia 02/25/2013  . H. pylori infection 12/19/2012  . Umbilical hernia 31/49/7026  . Macrocytic anemia 07/08/2012  . Weight loss 12/29/2011  . Depression with anxiety 03/24/2011  . 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 Hypothyroid 02/06/2011  . Thrombocythemia ([place]) 01/24/2011    Orientation RESPIRATION BLADDER Height & Weight     Self, Place  O2 (2 L) Indwelling catheter Weight: 138 lb 7.2 oz (62.8 kg) Height:  5\' 4"  (162.6 cm)  BEHAVIORAL SYMPTOMS/MOOD NEUROLOGICAL BOWEL NUTRITION STATUS    Convulsions/Seizures (in childhood) Incontinent Diet (Regular. See d/c summary for updates.)  AMBULATORY STATUS COMMUNICATION OF NEEDS Skin   Extensive Assist Verbally Bruising, Other (Comment) (stage 4 to sacrum with BID dressing changes. Left heel deep tissue pressure injury. Bilateral prevalon boots.)                       Personal Care Assistance Level of Assistance  Feeding, Bathing, Dressing Bathing Assistance: Maximum assistance Feeding assistance: Limited assistance Dressing Assistance: Maximum assistance     Functional Limitations Info  Hearing, Sight, Speech Sight Info: Adequate Hearing Info: Adequate Speech Info: Adequate    SPECIAL CARE FACTORS FREQUENCY                       Contractures Contractures Info: Not present    Additional Factors Info  Code Status, Allergies, Psychotropic Code Status Info: Full code Allergies Info: No known allergies. Psychotropic  Info: Xanax, Buspar, Trazodone, Effexor         Current Medications (04/20/2020):  This is the current hospital active medication list Current Facility-Administered Medications  Medication Dose Route Frequency Provider Last Rate Last Admin  . 0.9 %  sodium chloride infusion (Manually program via Guardrails IV Fluids)   Intravenous Once  Kathie Dike, MD      . 0.9 %  sodium chloride infusion   Intravenous Continuous Zierle-Ghosh, Asia B, DO 100 mL/hr at 04/19/20 1827 New Bag at 04/19/20 1827  . acetaminophen (TYLENOL) tablet 650 mg  650 mg Oral Q6H PRN Zierle-Ghosh, Asia B, DO       Or  . acetaminophen (TYLENOL) suppository 650 mg  650 mg Rectal Q6H PRN Zierle-Ghosh, Asia B, DO      . albuterol (VENTOLIN HFA) 108 (90 Base) MCG/ACT inhaler 2 puff  2 puff Inhalation Q4H PRN Zierle-Ghosh, Asia B, DO      . ALPRAZolam (XANAX) tablet 0.25 mg  0.25 mg Oral TID PRN Kathie Dike, MD   0.25 mg at 04/18/20 2015  . atorvastatin (LIPITOR) tablet 10 mg  10 mg Oral Daily Zierle-Ghosh, Asia B, DO   10 mg at 04/20/20 0949  . busPIRone (BUSPAR) tablet 15 mg  15 mg Oral BID Zierle-Ghosh, Asia B, DO   15 mg at 04/20/20 0950  . Chlorhexidine Gluconate Cloth 2 % PADS 6 each  6 each Topical Daily Kathie Dike, MD   6 each at 04/20/20 0950  . collagenase (SANTYL) ointment   Topical BID Virl Cagey, MD   Given at 04/20/20 [phone]  . ertapenem (INVANZ) 1,000 mg in sodium chloride 0.9 % 100 mL IVPB  1 g Intravenous Q24H Coffee, Donna Christen, [facility]      . famotidine (PEPCID) tablet 20 mg  20 mg Oral BID Zierle-Ghosh, Asia B, DO   20 mg at 04/20/20 0950  . feeding supplement (BOOST / RESOURCE BREEZE) liquid 1 Container  1 Container Oral TID BM Kathie Dike, MD   1 Container at 04/20/20 0951  . fluticasone (FLONASE) 50 MCG/ACT nasal spray 1 spray  1 spray Each Nare Daily Zierle-Ghosh, Asia B, DO   1 spray at 04/20/20 0951  . gabapentin (NEURONTIN) capsule 400 mg  400 mg Oral QHS Zierle-Ghosh, Asia B, DO   400 mg at 04/19/20 2137  . levothyroxine (SYNTHROID) tablet 100 mcg  100 mcg Oral Q0600 Zierle-Ghosh, Asia B, DO   100 mcg at 04/20/20 0637  . linaclotide (LINZESS) capsule 145 mcg  145 mcg Oral QAC breakfast Kathie Dike, MD   145 mcg at 04/20/20 0854  . magic mouthwash w/lidocaine  15 mL Oral TID PRN Zierle-Ghosh, Asia B, DO      .  mometasone-formoterol (DULERA) 100-5 MCG/ACT inhaler 2 puff  2 puff Inhalation BID Zierle-Ghosh, Asia B, DO   2 puff at 04/20/20 0921  . montelukast (SINGULAIR) tablet 10 mg  10 mg Oral QHS Zierle-Ghosh, Asia B, DO   10 mg at 04/19/20 2136  . morphine 2 MG/ML injection 2 mg  2 mg Intravenous Q2H PRN Zierle-Ghosh, Asia B, DO   2 mg at 04/17/20 1957  . multivitamin-lutein (OCUVITE-LUTEIN) capsule 1 capsule  1 capsule Oral QHS Kathie Dike, MD   1 capsule at 04/19/20 2137  . ondansetron (ZOFRAN) tablet 4 mg  4 mg Oral Q6H PRN Zierle-Ghosh, Asia B, DO       Or  . ondansetron (ZOFRAN) injection 4 mg  4 mg Intravenous Q6H PRN Zierle-Ghosh, [place]  B, DO      . oxyCODONE (Oxy IR/ROXICODONE) immediate release tablet 5 mg  5 mg Oral Q4H PRN Zierle-Ghosh, Asia B, DO   5 mg at 04/20/20 0639  . polyethylene glycol (MIRALAX / GLYCOLAX) packet 17 g  17 g Oral Daily PRN Zierle-Ghosh, Asia B, DO      . rivaroxaban (XARELTO) tablet 10 mg  10 mg Oral Daily Zierle-Ghosh, Asia B, DO   10 mg at 04/20/20 0951  . traZODone (DESYREL) tablet 100 mg  100 mg Oral QHS Zierle-Ghosh, Asia B, DO   100 mg at 04/19/20 2136  . vancomycin (VANCOREADY) IVPB 1250 mg/250 mL  1,250 mg Intravenous Q24H Zierle-Ghosh, Asia B, DO 166.7 mL/hr at 04/19/20 2150 1,250 mg at 04/19/20 2150  . venlafaxine XR (EFFEXOR-XR) 24 hr capsule 75 mg  75 mg Oral Q breakfast Zierle-Ghosh, Asia B, DO   75 mg at 04/20/20 3903     Discharge Medications: Please see discharge summary for a list of discharge medications.  Relevant Imaging Results:  Relevant Lab Results:   Additional Information SSN: [id number]. Pt had Janssen vaccine 01/01/20. Pt will have PICC for 6 weeks IV antibiotics-vancomycin and ertapenem.  Sherie Don, LCSW

## 2020-04-20 NOTE — Progress Notes (Signed)
PROGRESS NOTE    Rachael Johnson  JQB:[id number] DOB: [birth year] DOA: 04/16/2020 PCP: Mosie Lukes, MD    Brief Narrative:   Rachael Johnson  is a 69 y.o. female, with history of CVA with residual left sided weakness, thyroid disease, seizure, rhabdo, restless leg syndrome, GERD, COPD, and more presents to the ED with a chief complaint of painful ulcer. Patient reports that the ulcer started 2 months ago. She says that she has not had any previous treatments for it. It started out small, and has worsened in size and pain over the two months. She reports that propping herself up with a pillow makes the pain better. Palpating or laying supine make it worse. Patient reports that since her stroke she has only been able to ambulate with a wheelchair. She is not sure when, but the wound has started draining brown, malodorous fluid. Patient reports that she has been feverish with Tmax 102. She has not taken any antipyretics. Patient reports that she has had a cough that also started two months ago. The cough has been improving recently. The cough has been non productive. She reports that she has had dyspnea, but has not had to use more than her 4LNC that is her baseline. She reports no chest pain, palpitations, or peripheral edema. She reports a normal appetite, and normal water intake. Patient does report painful mouth and blisters. She does use steroid inhalers at home.   Assessment & Plan:   Principal Problem:   Sacral decubitus ulcer, stage IV (HCC) Active Problems:   Thrombocythemia ([place])   Hypothyroid   Goals of care, counseling/discussion   COPD (chronic obstructive pulmonary disease) (HCC)   Osteomyelitis (HCC)   Decubitus ulcer of left heel, unstageable ([place])   Acute encephalopathy   Palliative care by specialist   Severe protein-calorie malnutrition ([place])   Adult failure to thrive   Weakness   1.  Infected sacral decubitus ulcer.  Seen by general surgery and debridement done at  bedside.  She is on broad-spectrum antibiotics.  Imaging indicates underlying osteomyelitis.    Discussed with Dr West Bali with infectious disease with recommendations for 6 weeks of IV vancomycin and ertapenem.    Wound culture in process.  Will request PICC line  2.  Sepsis.  Likely related to #1 and #2.  Vitals appear to be improving.  Continue antibiotics.  Blood cultures have shown no growth to date  3.  Recently diagnosed superficial thrombus.  Followed by oncology and started on Xarelto.  Plan is for 2 months of anticoagulation.  4.  Essential thrombocythemia.  She is chronically on interferon through oncology.  Platelet counts do not appear to be substantially elevated.  5.  Failure to thrive with severe protein calorie malnutrition.  Albumin of 2.0, I suspect this will continue to decline with IV hydration.  Nutrition consult.  Will request calorie count.  6.  Hypokalemia.  Replaced  7.  Acute encephalopathy.  Metabolic work-up is unrevealing.  She does not appear to have any focal deficits.  After discussing with patient's son, appears to be a more chronic process.  Likely has some degree of mild cognitive impairment at baseline and symptoms got substantially worse after recent prolonged admission for pneumonia.  Overall, mental status has improved since admission  8.  COPD.  No complaints of shortness of breath at this time.  9.  Constipation.  Provided enema.  Started on Linzess.  10.  Urinary retention.  Likely related to constipation.  Consider  voiding trial in next 24 hours  11.  Goals of care.  Long discussion with patient's son Rachael Johnson.  Patient has had a significant decline over the past 2 months.  It appears that she did have some mild cognitive impairment prior to this, but was functional and active.  Since her admission approximately 2 months ago for pneumonia which was prolonged, she has had a consistent decline.  Her p.o. intake has been poor and she has been  losing weight.  She is developed a significant sacral ulcer that will be very difficult to heal considering her poor nutritional status.  Palliative care consulted with plans for family meeting on 9/21.  Long-term prognosis is poor.  12.  Anemia, likely related to nutritional deficiencies/chronic disease.  No obvious bleeding.  Check stool for occult blood.  Ferritin/iron did not indicate iron deficiency.  Anemia is macrocytic.  Folic acid is low and will be replaced.  B12 level is not low.  Transfused 1 unit of PRBC.  Continue to follow hemoglobin   DVT prophylaxis: rivaroxaban (XARELTO) tablet 10 mg Start: 04/17/20 1000 SCDs Start: 04/17/20 0148 rivaroxaban (XARELTO) tablet 10 mg  Code Status: Full code Family Communication: Discussed with patient's son, Rachael Johnson 9/20 Disposition Plan: Status is: Inpatient  Remains inpatient appropriate because:Inpatient level of care appropriate due to severity of illness   Dispo: The patient is from: Home              Anticipated d/c is to: TBD              Anticipated d/c date is: 2 days              Patient currently is not medically stable to d/c.   Consultants:   General surgery  Palliative care  Procedures:   Bedside debridement of sacral wound  Antimicrobials:   Vancomycin 9/18 >  Zosyn 9/18 > 9/20  Ertapenem 9/20>   Subjective: Still has some confusion, but more clear today.  Denies any significant complaints  Objective: Vitals:   04/20/20 0922 04/20/20 1500 04/20/20 1956 04/20/20 2041  BP:  129/83  (!) 141/84  Pulse:  (!) 110  (!) 115  Resp:  18    Temp:  97.9 F (36.6 C)  98.1 F (36.7 C)  TempSrc:    Oral  SpO2: 99% 97% 93% 98%  Weight:      Height:        Intake/Output Summary (Last 24 hours) at 04/20/2020 2059 Last data filed at 04/20/2020 1800 Gross per 24 hour  Intake 1920.02 ml  Output 2805 ml  Net -884.98 ml   Filed Weights   04/17/20 0329 04/18/20 0413 04/19/20 0408  Weight: 64.4 kg 60.4 kg  62.8 kg    Examination:  General exam: Alert, awake, oriented x 3 Respiratory system: Clear to auscultation. Respiratory effort normal. Cardiovascular system:RRR. No murmurs, rubs, gallops. Gastrointestinal system: Abdomen is nondistended, soft and nontender. No organomegaly or masses felt. Normal bowel sounds heard. Central nervous system: Alert and oriented. No focal neurological deficits. Extremities: No C/C/E, +pedal pulses Skin: Large sacral wound Psychiatry: She is more clear today, but still has periods of confusion   Data Reviewed: I have personally reviewed following labs and imaging studies  CBC: Recent Labs  Lab 04/16/20 2038 04/16/20 2038 04/17/20 0630 04/18/20 0812 04/19/20 1101 04/20/20 0420 04/20/20 1357  WBC 13.4*  --  11.8* 12.2* 8.7 7.5  --   NEUTROABS 11.4*  --  10.0*  --   --   --   --  HGB 9.3*   < > 8.6* 8.4* 7.1* 7.6* 10.4*  HCT 31.0*   < > 29.1* 28.2* 24.6* 25.4* 33.2*  MCV 106.2*  --  107.0* 108.0* 108.8* 106.3*  --   PLT 475*  --  427* 451* 515* 554*  --    < > = values in this interval not displayed.   Basic Metabolic Panel: Recent Labs  Lab 04/16/20 1948 04/16/20 2038 04/17/20 0630 04/18/20 0812 04/19/20 1101 04/20/20 0420  NA  --  139 138 139 136 134*  K  --  2.4* 3.6 3.3* 2.9* 3.3*  CL  --  96* 99 102 103 103  CO2  --  33* 31 27 26 25   GLUCOSE  --  103* 106* 101* 104* 85  BUN  --  30* 25* 18 11 8   CREATININE  --  1.06* 0.82 0.70 0.51 0.41*  CALCIUM  --  8.8* 8.2* 8.2* 7.5* 7.6*  MG 2.1  --  2.0  --   --   --   PHOS  --   --  2.8  --   --   --    GFR: Estimated Creatinine Clearance: 58.1 mL/min (A) (by C-G formula based on SCr of 0.41 mg/dL (L)). Liver Function Tests: Recent Labs  Lab 04/16/20 2038 04/17/20 0630 04/18/20 0812 04/19/20 1101  AST 78* 72* 58* 56*  ALT 27 25 24 24   ALKPHOS 182* 156* 161* 166*  BILITOT 1.1 0.7 0.8 0.6  PROT 7.2 6.4* 5.9* 5.2*  ALBUMIN 2.5* 2.2* 2.0* 1.8*   No results for input(s): LIPASE,  AMYLASE in the last 168 hours. Recent Labs  Lab 04/18/20 0812  AMMONIA 18   Coagulation Profile: No results for input(s): INR, PROTIME in the last 168 hours. Cardiac Enzymes: No results for input(s): CKTOTAL, CKMB, CKMBINDEX, TROPONINI in the last 168 hours. BNP (last 3 results) No results for input(s): PROBNP in the last 8760 hours. HbA1C: No results for input(s): HGBA1C in the last 72 hours. CBG: Recent Labs  Lab 04/18/20 2026  GLUCAP 103*   Lipid Profile: No results for input(s): CHOL, HDL, LDLCALC, TRIG, CHOLHDL, LDLDIRECT in the last 72 hours. Thyroid Function Tests: Recent Labs    04/18/20 0812  TSH 4.776*   Anemia Panel: Recent Labs    04/18/20 0812 04/20/20 0420  VITAMINB12 736  --   FOLATE  --  3.2*  FERRITIN  --  1,954*  TIBC  --  89*  IRON  --  31   Sepsis Labs: Recent Labs  Lab 04/16/20 2116 04/17/20 0013  LATICACIDVEN 1.2 2.1*    Recent Results (from the past 240 hour(s))  SARS Coronavirus 2 by RT PCR (hospital order, performed in [facility] hospital lab) Nasopharyngeal Nasopharyngeal Swab     Status: None   Collection Time: 04/16/20  7:39 PM   Specimen: Nasopharyngeal Swab  Result Value Ref Range Status   SARS Coronavirus 2 NEGATIVE NEGATIVE Final    Comment: (NOTE) SARS-CoV-2 target nucleic acids are NOT DETECTED.  The SARS-CoV-2 RNA is generally detectable in upper and lower respiratory specimens during the acute phase of infection. The lowest concentration of SARS-CoV-2 viral copies this assay can detect is 250 copies / mL. A negative result does not preclude SARS-CoV-2 infection and should not be used as the sole basis for treatment or other patient management decisions.  A negative result may occur with improper specimen collection / handling, submission of specimen other than nasopharyngeal swab, presence of viral mutation(s) within  the areas targeted by this assay, and inadequate number of viral copies (<250 copies / mL). A  negative result must be combined with clinical observations, patient history, and epidemiological information.  Fact Sheet for Patients:   StrictlyIdeas.no  Fact Sheet for Healthcare Providers: BankingDealers.co.za  This test is not yet approved or  cleared by the [place] FDA and has been authorized for detection and/or diagnosis of SARS-CoV-2 by FDA under an Emergency Use Authorization (EUA).  This EUA will remain in effect (meaning this test can be used) for the duration of the COVID-19 declaration under Section 564(b)(1) of the Act, 21 U.S.C. section 360bbb-3(b)(1), unless the authorization is terminated or revoked sooner.  Performed at [facility], [address] [place], [place] [address]   Culture, blood (routine x 2)     Status: None (Preliminary result)   Collection Time: 04/16/20  9:16 PM   Specimen: Right Antecubital; Blood  Result Value Ref Range Status   Specimen Description RIGHT ANTECUBITAL  Final   Special Requests   Final    BOTTLES DRAWN AEROBIC AND ANAEROBIC Blood Culture adequate volume   Culture   Final    NO GROWTH 4 DAYS Performed at [facility], [address], [place], [place] [address]    Report Status PENDING  Incomplete  Culture, blood (routine x 2)     Status: None (Preliminary result)   Collection Time: 04/16/20  9:25 PM   Specimen: BLOOD RIGHT HAND  Result Value Ref Range Status   Specimen Description BLOOD RIGHT HAND  Final   Special Requests   Final    BOTTLES DRAWN AEROBIC AND ANAEROBIC Blood Culture adequate volume   Culture   Final    NO GROWTH 4 DAYS Performed at [facility], [address], [place], [place] [address]    Report Status PENDING  Incomplete  Aerobic/Anaerobic Culture (surgical/deep wound)     Status: None (Preliminary result)   Collection Time: 04/18/20  7:27 PM   Specimen: Sacral  Result Value Ref Range Status   Specimen Description   Final    SACRAL Performed at  [facility], [address] [place], [place] [address]    Special Requests   Final    NONE Performed at [facility], [address], [place], [place] [address]    Gram Stain   Final    RARE WBC PRESENT, PREDOMINANTLY PMN RARE GRAM POSITIVE COCCI IN PAIRS Performed at [facility], [place] [address] [place], [place] [address]    Culture PENDING  Incomplete   Report Status PENDING  Incomplete         Radiology Studies: CT CHEST W CONTRAST  Result Date: 04/19/2020 CLINICAL DATA:  Fever, cough, pneumonia EXAM: CT CHEST WITH CONTRAST TECHNIQUE: Multidetector CT imaging of the chest was performed during intravenous contrast administration. CONTRAST:  31mL OMNIPAQUE IOHEXOL 300 MG/ML  SOLN COMPARISON:  04/17/2020, 04/16/2020, 03/03/2020 FINDINGS: Cardiovascular: The heart is unremarkable without pericardial effusion. Normal caliber of the thoracic aorta. No evidence of dissection. Minimal atherosclerosis of the aortic arch. Mediastinum/Nodes: No enlarged mediastinal, hilar, or axillary lymph nodes. Thyroid gland, trachea, and esophagus demonstrate no significant findings. Small hiatal hernia. Lungs/Pleura: There are trace bilateral pleural effusions, right greater than left. Wedge-shaped area of peripheral consolidation within the right upper lobe measuring 2.7 x 2.0 cm likely reflects residual atelectasis. There are bilateral areas of subpleural scarring and fibrosis. No acute airspace disease or pneumothorax. Stable bilateral bronchiectasis. Central airways are patent. Upper Abdomen: Diffuse  hepatic steatosis. No acute upper abdominal process. Musculoskeletal: No acute displaced fracture. Reconstructed images demonstrate no additional findings. IMPRESSION: 1. Wedge-shaped consolidation right upper lobe likely reflects residual atelectasis. Continued follow-up recommended. 2. Trace bilateral pleural effusions. 3. Chronic bilateral areas of scarring and bronchiectasis. 4. Hepatic steatosis. 5.   Aortic Atherosclerosis (ICD10-I70.0). Electronically Signed   By: Randa Ngo M.D.   On: 04/19/2020 21:01        Scheduled Meds: . sodium chloride   Intravenous Once  . atorvastatin  10 mg Oral Daily  . busPIRone  15 mg Oral BID  . Chlorhexidine Gluconate Cloth  6 each Topical Daily  . collagenase   Topical BID  . famotidine  20 mg Oral BID  . feeding supplement  1 Container Oral TID BM  . fluticasone  1 spray Each Nare Daily  . gabapentin  400 mg Oral QHS  . levothyroxine  100 mcg Oral Q0600  . linaclotide  145 mcg Oral QAC breakfast  . mometasone-formoterol  2 puff Inhalation BID  . montelukast  10 mg Oral QHS  . multivitamin-lutein  1 capsule Oral QHS  . rivaroxaban  10 mg Oral Daily  . traZODone  100 mg Oral QHS  . venlafaxine XR  75 mg Oral Q breakfast   Continuous Infusions: . sodium chloride 100 mL/hr at 04/19/20 1827  . ertapenem 1,000 mg (04/20/20 1851)  . vancomycin 1,250 mg (04/19/20 2150)     LOS: 3 days    Time spent: 22mins    Kathie Dike, MD [facility]   If 7PM-7AM, please contact night-coverage [email or web address]  04/20/2020, 8:59 PM

## 2020-04-20 NOTE — TOC Progression Note (Signed)
Transition of Care [facility]) - Progression Note   Patient Details  Name: Rachael Johnson MRN: [id number] Date of Birth: Oct 29, 1950  Transition [facility]) CM/SW [place], LCSW Phone Number: 04/20/2020, 3:04 PM  Clinical Narrative: Palliative consult complete; patient and son agreeable to SNF for IV antibiotics. CSW attempted to call son to get choices, but was unable to reach him. CSW left voicemail requesting call back. CSW spoke with patient and patient is agreeable to CSW faxing her out for SNF. Initial referral faxed out; awaiting bed offers.  Barriers to Discharge: Continued Medical Work up  Expected Discharge Plan and Services Living arrangements for the past 2 months: [place]  Readmission Risk Interventions No flowsheet data found.

## 2020-04-20 NOTE — Progress Notes (Signed)
Daily Progress Note   Patient Name: Rachael Johnson       Date: 04/20/2020 DOB: [birth year]  Age: 69 y.o. MRN#: [id number] Attending Physician: Kathie Dike, MD Primary Care Physician: Mosie Lukes, MD Admit Date: 04/16/2020  Reason for Consultation/Follow-up: Establishing goals of care  Subjective: Upon arrival to room, [name] is awake and alert. She answers orientation questions appropriately including name, place, date, situation. She asks to see pictures of her sacral wound. She is not currently in pain or discomfort. Reports poor appetite because she does not like the food here. She is requesting a cheeseburger and fries for lunch, which was ordered.   GOC:  Son, [name] at bedside to discuss goals of care.   Introduced role of palliative medicine to son and patient. Explained importance of discussing her goals while she is awake, alert, cognizant and able to make her wishes known.   Discussed in detail diagnoses, interventions, plan of care, and concerns with her ability to recover with stage 4 wound to the bone, poor nutritional and functional status, and risk for recurrent hospitalization secondary to sepsis from wound. Discussed plan for IV antibiotics for at least 6 weeks and ongoing wound care. Discussed nutritional status and recommendations at length.   I attempted to elicit values and goals of care important to the patient. Advanced directives, concepts specific to code status, artifical feeding and hydration were discussed. [name] is her documented HCPOA. Patient does have a documented desire for natural death which clearly explains that she would not wish for prolonging measures if terminal or incurable. Copy placed in chart for EMR.   Discussed code status and concerns  with performing aggressive measures if her condition worsening, with fear this would not change the outcome and unlikelihood she would have meaningful quality of life. Patient does speak of her wishes for short term efforts to save her life, including CPR and life support machine. She clearly tells Joey and I that she would not wish for prolonged ventilator/measures if unable to survive off of machines. Will remain a full code at this time. Patient speaks about wanting to live for her children and grandchildren.   Discussed 'hoping for the best, but also preparing for the worst' with biggest concern being her nutritional status and ability for wound to heal. Expressed to [facility]  that she may be faced with decision for feeding tube in the relatively near future if her nutritional status does not improve.  We also discussed that she will need SNF placement following hospitalization. Patient and son understand and agree.   Since patient is awake, alert, cognizant and able to make decisions this afternoon, allowed visitor exception for notary to visit bedside to complete durable POA paperwork with son. This is very important for son to complete.   Answered questions and concerns. Hard Choices booklet, MOST form, and PMT contact information given.    Length of Stay: 3  Current Medications: Scheduled Meds:  . sodium chloride   Intravenous Once  . atorvastatin  10 mg Oral Daily  . busPIRone  15 mg Oral BID  . Chlorhexidine Gluconate Cloth  6 each Topical Daily  . collagenase   Topical BID  . famotidine  20 mg Oral BID  . feeding supplement  1 Container Oral TID BM  . fluticasone  1 spray Each Nare Daily  . gabapentin  400 mg Oral QHS  . levothyroxine  100 mcg Oral Q0600  . linaclotide  145 mcg Oral QAC breakfast  . mometasone-formoterol  2 puff Inhalation BID  . montelukast  10 mg Oral QHS  . multivitamin-lutein  1 capsule Oral QHS  . rivaroxaban  10 mg Oral Daily  . traZODone  100 mg Oral QHS    . venlafaxine XR  75 mg Oral Q breakfast    Continuous Infusions: . sodium chloride 100 mL/hr at 04/19/20 1827  . ertapenem    . vancomycin 1,250 mg (04/19/20 2150)    PRN Meds: acetaminophen **OR** acetaminophen, albuterol, ALPRAZolam, magic mouthwash w/lidocaine, morphine injection, ondansetron **OR** ondansetron (ZOFRAN) IV, oxyCODONE, polyethylene glycol  Physical Exam Vitals and nursing note reviewed.  Constitutional:      General: She is awake.     Appearance: She is cachectic. She is ill-appearing.  HENT:     Head: Normocephalic and atraumatic.  Pulmonary:     Effort: No tachypnea, accessory muscle usage or respiratory distress.  Skin:    General: Skin is warm and dry.     Coloration: Skin is pale.  Neurological:     Mental Status: She is alert.     Comments: Oriented to person, place, date, and situation this AM  Psychiatric:        Mood and Affect: Mood normal.        Speech: Speech normal.        Behavior: Behavior normal.        Cognition and Memory: Cognition normal.            Vital Signs: BP (!) 143/82   Pulse (!) 101   Temp (!) 97.5 F (36.4 C) (Oral)   Resp 18   Ht 5\' 4"  (1.626 m)   Wt 62.8 kg   LMP 07/31/1990   SpO2 99%   BMI 23.76 kg/m  SpO2: SpO2: 99 % O2 Device: O2 Device: Nasal Cannula O2 Flow Rate: O2 Flow Rate (L/min): 2 L/min  Intake/output summary:   Intake/Output Summary (Last 24 hours) at 04/20/2020 2025 Last data filed at 04/20/2020 0600 Gross per 24 hour  Intake 1920.02 ml  Output 1400 ml  Net 520.02 ml   LBM: Last BM Date: 04/18/20 Baseline Weight: Weight: 64.4 kg Most recent weight: Weight: 62.8 kg       Palliative Assessment/Data: PPS 40%      Patient Active Problem List   Diagnosis Date Noted  .  Acute encephalopathy   . Palliative care by specialist   . Osteomyelitis ([place]) 04/17/2020  . Sacral decubitus ulcer, stage IV ([place]) 04/17/2020  . Decubitus ulcer of left heel, unstageable ([place]) 04/17/2020  .  Pernicious anemia 10/31/2019  . Hypokalemia 10/16/2019  . Insomnia 02/05/2019  . Urinary frequency 06/19/2018  . 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 Sinusitis 11/05/2017  . Chronic back pain 09/09/2017  . Osteoarthritis 05/24/2017  . Iron deficiency anemia due to chronic blood loss 04/03/2017  . Hypomagnesemia 03/18/2017  . Right shoulder pain 03/14/2017  . LFTs abnormal 03/13/2017  . Hypernatremia 03/13/2017  . Elevated LFTs   . COPD (chronic obstructive pulmonary disease) ([place]) 01/23/2017  . Lung nodule 12/18/2016  . Bronchiectasis assoc with MPNs 11/28/2016  . Cough variant asthma vs UACS 11/27/2016  . Vitamin D deficiency 03/07/2016  . Dysuria 03/07/2016  . Essential hypertension 03/07/2016  . Benign paroxysmal positional vertigo 03/07/2016  . Hyperglycemia 12/17/2015  . Cataract 12/21/2014  . Restless leg syndrome 09/30/2014  . Allergies 04/27/2014  . Constipation 11/24/2013  . Goals of care, counseling/discussion 04/20/2013  . Hyperlipidemia 02/25/2013  . H. pylori infection 12/19/2012  . Umbilical hernia 58/03/9832  . Macrocytic anemia 07/08/2012  . Weight loss 12/29/2011  . Depression with anxiety 03/24/2011  . 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 Hypothyroid 02/06/2011  . Thrombocythemia ([place]) 01/24/2011    Palliative Care Assessment & Plan   Patient Profile: 69 y.o. female  with past medical history of thyroid disease, seizures, restless leg syndrome, GERD, COPD, CVA , essential thrombocythemia followed by oncology and receiving interferon, recent [facility] hospitalization for pneumonia admitted on 04/16/2020 with sacral wound. Hospital admission for infected sacral decubitus ulcer concerning for osteomyelitis. General surgery following s/p debridement. Possible pneumonia. Also with acute encephalopathy with work-up unrevealing. Dr Roderic Palau spoke with son who reports some degree of mild cognitive impairment at baseline with worsening mental status following prolonged  hospitalization for pneumonia. Poor long-term prognosis with failure to thrive, severe protein calorie malnutrition, decubitus ulcer with osteomyelitis, albumin of 2.0. Palliative medicine consultation for goals of care.   Assessment: Infected sacral decubitus ulcer Osteomyelitis Sepsis Possible pneumonia Adult failure to thrive Severe protein calorie malnutrition Acute encephalopathy Hypoalbuminemia Essential thrombocythemia Recently diagnosed superficial thrombus  Recommendations/Plan:  Documented HCPOA in EMR. Son [name] [place]).   Today, patient is oriented and cognizant during [place] discussion. She does speak of her desire for full code and short-term efforts to prolong life including CPR/ventilator support. Patient would not wish for long-term life prolonging measures if terminal or incurable.   Continue full code/full scope treatment.  Will need PICC placement for prolonged IV antibiotics per ID.  PT evaluation. Will need SNF rehab placement.  Continue ongoing assist and encouragement with meals. Continue RD recommendations. Expressed concern to patient and son that she may be faced with decision for feeding tube in the relatively near future if her nutritional status does not improve.   Continue wound care. Needs speciality bed.   Outpatient palliative referral for ongoing support and [place] discussions. Patient high risk for decline and hospital readmission.    Code Status: FULL   Code Status Orders  (From admission, onward)         Start     Ordered   04/17/20 0148  Full code  Continuous        04/17/20 0147        Code Status History    Date Active Date Inactive Code Status Order ID Comments User Context  03/13/2017 0122 03/24/2017 0020 Full Code [id number]  Vianne Bulls, MD ED   Advance Care Planning Activity    Advance Directive Documentation     Most Recent Value  Type of Advance Directive Living will  Pre-existing out of facility DNR order (yellow  form or pink MOST form) --  "MOST" Form in Place? --       Prognosis:  Poor long-term prognosis  Discharge Planning:  To Be Determined: Will need SNF placement  Care plan was discussed with RN, Dr. Roderic Palau, son [name])  Thank you for allowing the Palliative Medicine Team to assist in the care of this patient.   Time In: 1000 Time Out: 1115 Total Time 75 Prolonged Time Billed  yes      Greater than 50%  of this time was spent counseling and coordinating care related to the above assessment and plan.  Ihor Dow, DNP, FNP-C Palliative Medicine Team  Phone: [phone] Fax: [phone]  Please contact Palliative Medicine Team phone at [phone] for questions and concerns.

## 2020-04-21 DIAGNOSIS — D693 Immune thrombocytopenic purpura: Secondary | ICD-10-CM | POA: Diagnosis not present

## 2020-04-21 DIAGNOSIS — T83091A Other mechanical complication of indwelling urethral catheter, initial encounter: Secondary | ICD-10-CM | POA: Diagnosis not present

## 2020-04-21 DIAGNOSIS — E039 Hypothyroidism, unspecified: Secondary | ICD-10-CM | POA: Diagnosis not present

## 2020-04-21 DIAGNOSIS — M549 Dorsalgia, unspecified: Secondary | ICD-10-CM

## 2020-04-21 DIAGNOSIS — M4628 Osteomyelitis of vertebra, sacral and sacrococcygeal region: Secondary | ICD-10-CM | POA: Diagnosis present

## 2020-04-21 DIAGNOSIS — W19XXXD Unspecified fall, subsequent encounter: Secondary | ICD-10-CM | POA: Diagnosis not present

## 2020-04-21 DIAGNOSIS — L89324 Pressure ulcer of left buttock, stage 4: Secondary | ICD-10-CM | POA: Diagnosis not present

## 2020-04-21 DIAGNOSIS — Z87891 Personal history of nicotine dependence: Secondary | ICD-10-CM | POA: Diagnosis not present

## 2020-04-21 DIAGNOSIS — K5900 Constipation, unspecified: Secondary | ICD-10-CM | POA: Diagnosis not present

## 2020-04-21 DIAGNOSIS — R000 Tachycardia, unspecified: Secondary | ICD-10-CM | POA: Diagnosis not present

## 2020-04-21 DIAGNOSIS — M6282 Rhabdomyolysis: Secondary | ICD-10-CM | POA: Diagnosis present

## 2020-04-21 DIAGNOSIS — Z79890 Hormone replacement therapy (postmenopausal): Secondary | ICD-10-CM | POA: Diagnosis not present

## 2020-04-21 DIAGNOSIS — E860 Dehydration: Secondary | ICD-10-CM | POA: Diagnosis not present

## 2020-04-21 DIAGNOSIS — L89616 Pressure-induced deep tissue damage of right heel: Secondary | ICD-10-CM | POA: Diagnosis not present

## 2020-04-21 DIAGNOSIS — Z23 Encounter for immunization: Secondary | ICD-10-CM | POA: Diagnosis not present

## 2020-04-21 DIAGNOSIS — L89613 Pressure ulcer of right heel, stage 3: Secondary | ICD-10-CM | POA: Diagnosis not present

## 2020-04-21 DIAGNOSIS — D649 Anemia, unspecified: Secondary | ICD-10-CM | POA: Diagnosis not present

## 2020-04-21 DIAGNOSIS — Z7401 Bed confinement status: Secondary | ICD-10-CM | POA: Diagnosis not present

## 2020-04-21 DIAGNOSIS — L89620 Pressure ulcer of left heel, unstageable: Secondary | ICD-10-CM

## 2020-04-21 DIAGNOSIS — I8290 Acute embolism and thrombosis of unspecified vein: Secondary | ICD-10-CM | POA: Diagnosis not present

## 2020-04-21 DIAGNOSIS — R531 Weakness: Secondary | ICD-10-CM | POA: Diagnosis not present

## 2020-04-21 DIAGNOSIS — I6930 Unspecified sequelae of cerebral infarction: Secondary | ICD-10-CM | POA: Diagnosis not present

## 2020-04-21 DIAGNOSIS — I10 Essential (primary) hypertension: Secondary | ICD-10-CM | POA: Diagnosis not present

## 2020-04-21 DIAGNOSIS — Z466 Encounter for fitting and adjustment of urinary device: Secondary | ICD-10-CM | POA: Diagnosis not present

## 2020-04-21 DIAGNOSIS — G8929 Other chronic pain: Secondary | ICD-10-CM | POA: Diagnosis not present

## 2020-04-21 DIAGNOSIS — M6281 Muscle weakness (generalized): Secondary | ICD-10-CM | POA: Diagnosis present

## 2020-04-21 DIAGNOSIS — D473 Essential (hemorrhagic) thrombocythemia: Secondary | ICD-10-CM | POA: Diagnosis not present

## 2020-04-21 DIAGNOSIS — R1312 Dysphagia, oropharyngeal phase: Secondary | ICD-10-CM | POA: Diagnosis present

## 2020-04-21 DIAGNOSIS — E43 Unspecified severe protein-calorie malnutrition: Secondary | ICD-10-CM | POA: Diagnosis present

## 2020-04-21 DIAGNOSIS — L89154 Pressure ulcer of sacral region, stage 4: Secondary | ICD-10-CM | POA: Diagnosis present

## 2020-04-21 DIAGNOSIS — R627 Adult failure to thrive: Secondary | ICD-10-CM | POA: Diagnosis present

## 2020-04-21 DIAGNOSIS — M869 Osteomyelitis, unspecified: Secondary | ICD-10-CM | POA: Diagnosis not present

## 2020-04-21 DIAGNOSIS — G9341 Metabolic encephalopathy: Secondary | ICD-10-CM | POA: Diagnosis present

## 2020-04-21 DIAGNOSIS — G3184 Mild cognitive impairment, so stated: Secondary | ICD-10-CM | POA: Diagnosis present

## 2020-04-21 DIAGNOSIS — J449 Chronic obstructive pulmonary disease, unspecified: Secondary | ICD-10-CM | POA: Diagnosis not present

## 2020-04-21 DIAGNOSIS — R1314 Dysphagia, pharyngoesophageal phase: Secondary | ICD-10-CM | POA: Diagnosis present

## 2020-04-21 DIAGNOSIS — L89159 Pressure ulcer of sacral region, unspecified stage: Secondary | ICD-10-CM | POA: Diagnosis not present

## 2020-04-21 DIAGNOSIS — R2681 Unsteadiness on feet: Secondary | ICD-10-CM | POA: Diagnosis present

## 2020-04-21 LAB — CBC
HCT: 31.6 % — ABNORMAL LOW (ref 36.0–46.0)
Hemoglobin: 10.1 g/dL — ABNORMAL LOW (ref 12.0–15.0)
MCH: 31.9 pg (ref 26.0–34.0)
MCHC: 32 g/dL (ref 30.0–36.0)
MCV: 99.7 fL (ref 80.0–100.0)
Platelets: 586 10*3/uL — ABNORMAL HIGH (ref 150–400)
RBC: 3.17 MIL/uL — ABNORMAL LOW (ref 3.87–5.11)
RDW: 17.2 % — ABNORMAL HIGH (ref 11.5–15.5)
WBC: 8.3 10*3/uL (ref 4.0–10.5)
nRBC: 0.2 % (ref 0.0–0.2)

## 2020-04-21 LAB — COMPREHENSIVE METABOLIC PANEL
ALT: 28 U/L (ref 0–44)
AST: 62 U/L — ABNORMAL HIGH (ref 15–41)
Albumin: 2 g/dL — ABNORMAL LOW (ref 3.5–5.0)
Alkaline Phosphatase: 182 U/L — ABNORMAL HIGH (ref 38–126)
Anion gap: 7 (ref 5–15)
BUN: 7 mg/dL — ABNORMAL LOW (ref 8–23)
CO2: 23 mmol/L (ref 22–32)
Calcium: 7.5 mg/dL — ABNORMAL LOW (ref 8.9–10.3)
Chloride: 98 mmol/L (ref 98–111)
Creatinine, Ser: 0.44 mg/dL (ref 0.44–1.00)
GFR calc Af Amer: >60 mL/min (ref >60)
GFR calc non Af Amer: >60 mL/min (ref >60)
Glucose, Bld: 86 mg/dL (ref 70–99)
Potassium: 2.6 mmol/L — CL (ref 3.5–5.1)
Sodium: 128 mmol/L — ABNORMAL LOW (ref 135–145)
Total Bilirubin: 0.7 mg/dL (ref 0.3–1.2)
Total Protein: 6 g/dL — ABNORMAL LOW (ref 6.5–8.1)

## 2020-04-21 LAB — VANCOMYCIN, TROUGH: Vancomycin Tr: 8 ug/mL — ABNORMAL LOW (ref 15–20)

## 2020-04-21 LAB — CULTURE, BLOOD (ROUTINE X 2)
Culture: NO GROWTH
Culture: NO GROWTH
Special Requests: ADEQUATE
Special Requests: ADEQUATE

## 2020-04-21 LAB — SARS CORONAVIRUS 2 BY RT PCR (HOSPITAL ORDER, PERFORMED IN ~~LOC~~ HOSPITAL LAB): SARS Coronavirus 2: NEGATIVE

## 2020-04-21 MED ORDER — SODIUM CHLORIDE 0.9% FLUSH: 10.0000 mL | INTRAVENOUS | Status: DC | PRN

## 2020-04-21 MED ORDER — FUROSEMIDE 20 MG PO TABS: 20.0000 mg | ORAL_TABLET | Freq: Two times a day (BID) | ORAL | Status: DC | PRN

## 2020-04-21 MED ORDER — POTASSIUM CHLORIDE 20 MEQ PO PACK: 40.0000 meq | PACK | Freq: Once | ORAL | Status: DC

## 2020-04-21 MED ORDER — LINACLOTIDE 145 MCG PO CAPS: 145.0000 ug | ORAL_CAPSULE | Freq: Every day | ORAL | Status: AC

## 2020-04-21 MED ORDER — OXYCODONE-ACETAMINOPHEN 10-325 MG PO TABS: 1.0000 | ORAL_TABLET | Freq: Three times a day (TID) | ORAL | 0 refills | Status: DC | PRN

## 2020-04-21 MED ORDER — MAGNESIUM SULFATE IN D5W 1-5 GM/100ML-% IV SOLN
1.0000 g | Freq: Once | INTRAVENOUS | Status: AC
  Administered 2020-04-21: 1 g via INTRAVENOUS
  Filled 2020-04-21: qty 100

## 2020-04-21 MED ORDER — ALPRAZOLAM 0.25 MG PO TABS: 0.2500 mg | ORAL_TABLET | Freq: Three times a day (TID) | ORAL | 0 refills | Status: DC | PRN

## 2020-04-21 MED ORDER — VANCOMYCIN IV (FOR PTA / DISCHARGE USE ONLY): 1250.0000 mg | INTRAVENOUS | 0 refills | Status: AC

## 2020-04-21 MED ORDER — POTASSIUM CHLORIDE CRYS ER 20 MEQ PO TBCR: 20.0000 meq | EXTENDED_RELEASE_TABLET | Freq: Every day | ORAL | Status: DC

## 2020-04-21 MED ORDER — ENSURE ACTIVE HIGH PROTEIN PO LIQD: ORAL | Status: DC

## 2020-04-21 MED ORDER — NYSTATIN 100000 UNIT/ML MT SUSP: 5.0000 mL | Freq: Four times a day (QID) | OROMUCOSAL | Status: DC | PRN

## 2020-04-21 MED ORDER — SODIUM CHLORIDE 0.9% FLUSH: 10.0000 mL | Freq: Two times a day (BID) | INTRAVENOUS | Status: DC

## 2020-04-21 MED ORDER — POTASSIUM CHLORIDE 10 MEQ/100ML IV SOLN
10.0000 meq | INTRAVENOUS | Status: AC
  Administered 2020-04-21 (×4): 10 meq via INTRAVENOUS
  Filled 2020-04-21 (×2): qty 100

## 2020-04-21 MED ORDER — MAGNESIUM SULFATE 50 % IJ SOLN: 1.0000 g | Freq: Once | INTRAMUSCULAR | Status: DC

## 2020-04-21 MED ORDER — COLLAGENASE 250 UNIT/GM EX OINT: TOPICAL_OINTMENT | Freq: Two times a day (BID) | CUTANEOUS | Status: DC

## 2020-04-21 MED ORDER — POLYETHYLENE GLYCOL 3350 17 G PO PACK: 17.0000 g | PACK | Freq: Every day | ORAL | 0 refills | Status: DC | PRN

## 2020-04-21 MED ORDER — SODIUM CHLORIDE 0.9 % IV SOLN: INTRAVENOUS | Status: DC

## 2020-04-21 MED ORDER — ERTAPENEM IV (FOR PTA / DISCHARGE USE ONLY): 1.0000 g | INTRAVENOUS | 0 refills | Status: AC

## 2020-04-21 MED ORDER — ACETAMINOPHEN 325 MG PO TABS: 650.0000 mg | ORAL_TABLET | Freq: Four times a day (QID) | ORAL | Status: DC | PRN

## 2020-04-21 NOTE — Progress Notes (Signed)
Vascular nurse in pt's room at this time, telephone consent signed.

## 2020-04-21 NOTE — Progress Notes (Signed)
[facility] at [phone] transportation set up. Pickup time between 30 min to 3 hours.

## 2020-04-21 NOTE — Progress Notes (Signed)
MD notified of potassium 2.6.

## 2020-04-21 NOTE — Progress Notes (Signed)
Attempted to call report to [facility] at [phone]. Secretary Terrence Dupont stated nurse Rolan Lipa is in another resident's room and is unable to receive report at this time and will give [place] a call back once nurse Rolan Lipa is ready for report at [phone].

## 2020-04-21 NOTE — Progress Notes (Signed)
Spoke with Clarene Critchley on Vascular Access at phone number [phone] to inform her of pt's discharge and needs to have PICC inserted before discharge. Clarene Critchley stated she will let the PICC nurse know and will if there are any problems.

## 2020-04-21 NOTE — Progress Notes (Signed)
Nutrition Follow-up  DOCUMENTATION CODES:   Not applicable  INTERVENTION:  D/c Boost Breeze  Ensure Enlive po BID, each supplement provides 350 kcal and 20 grams of protein (strawberry)  Downgrade diet to D3 d/t poor dentition (does not have bottom dentures)  Encouraged po intake of meals and supplements  Unable to provide Ensure or D3 diet interventions, pt now discharged, orders have been reconciled   NUTRITION DIAGNOSIS:   Increased nutrient needs related to wound healing as evidenced by estimated needs. -ongoing  GOAL:   Patient will meet greater than or equal to 90% of their needs -progressing  MONITOR:   PO intake, Supplement acceptance, Labs, I & O's, Skin, Weight trends  REASON FOR ASSESSMENT:   Malnutrition Screening Tool, Consult Assessment of nutrition requirement/status  ASSESSMENT:   69 year old female with history of CVA with residual left sided weakness and ambulates with wheelchair, thyroid disease, RLS, GERD, and COPD admitted with sepsis secondary to infected sacral decubitus ulcer.   9/18 bedside debridement 9/21 PICC  Patient awake laying in bed, reports feeling good and trying to reach her son to inform him she was going to [facility] today. She appears mildly confused and is poor historian. Patient asking about pressure injuries, states she was told she had 2 sores, but only sees the one on L heel. RD informed pt about PI to sacrum, states "oh, that's why it hurts back there, when did I get that?" RD educated on how pressure injuries occur, and likely related to decreased ambulation, requiring use of wheelchair after stroke as reported in history, however pt reports she has never used a wheelchair. She endorsed having a good appetite and eating more vegetables recently. Recalls her son usually brings her a vegetable plate, but  would much rather eat [employer]. RD educated on the importance of adequate nutrition to support healing and encouraged po  intake of meals/supplements. Will d/c Boost Breeze, order Ensure to provide increased kcal/protein, reports liking strawberry flavor with a cup of ice.  Pt endorses some chewing difficulties, recalls top dentures currently in the bathroom, but does not have bottom dentures. She is agreeable to downgrading regular diet to dysphagia 3 (chopped meats).   Pt unsure of weight changes, says she hasn't been weighed in years, denied changes in how clothing was fitting.   Medications reviewed and include: Gabapentin, Linzess, Ocuvite, Klor-con, Vancomycin IVPB  IVF: NaCl @75  ml/hr  Labs: Na 128 (L), K 2.6 (L)   NUTRITION - FOCUSED PHYSICAL EXAM:    Most Recent Value  Orbital Region Moderate depletion  Upper Arm Region Moderate depletion  Thoracic and Lumbar Region Unable to assess  Buccal Region Unable to assess  [Not wearing dentures during exam]  Temple Region Moderate depletion  Clavicle and Acromion Bone Region Mild depletion  Scapular Bone Region Unable to assess  Dorsal Hand Moderate depletion  Patellar Region Unable to assess  Anterior Thigh Region Unable to assess  Posterior Calf Region Unable to assess  Edema (RD Assessment) Mild  [BLE,  non-pitting]  Hair Reviewed  Eyes Reviewed  Mouth Reviewed  Skin Reviewed  Nails Reviewed       Diet Order:   Diet Order            Diet - low sodium heart healthy           Diet regular Room service appropriate? Yes; Fluid consistency: Thin  Diet effective now  EDUCATION NEEDS:   Not appropriate for education at this time  Skin:  Skin Assessment: Skin Integrity Issues: Skin Integrity Issues:: DTI, Stage IV DTI: L;heel Stage IV: sacrum  Last BM:  9/21-type 7  Height:   Ht Readings from Last 1 Encounters:  04/16/20 5\' 4"  (1.626 m)    Weight:   Wt Readings from Last 1 Encounters:  04/19/20 62.8 kg    Ideal Body Weight:  54.5 kg  BMI:  Body mass index is 23.76 kg/m.  Estimated Nutritional Needs:    Kcal:  8329-1916  Protein:  96-106  Fluid:  > 1.6 L/day    Lajuan Lines, RD, LDN Clinical Nutrition After Hours/Weekend Pager # in [place]

## 2020-04-21 NOTE — Progress Notes (Signed)
MD notified of pt pulling out IV, confused, agitated, and combative. Unable to give medications due to pt refusal. PRN meds and soft restraints ordered.

## 2020-04-21 NOTE — TOC Transition Note (Signed)
Transition of Care [facility]) - CM/SW Discharge Note   Patient Details  Name: Rachael Johnson MRN: [id number] Date of Birth: 1951-04-21  Transition of [facility]) CM/SW Contact:  Salome Arnt, LCSW Phone Number: 04/21/2020, 3:47 PM   Clinical Narrative: Pt accepted bed at [facility]. Pt will d/c today after PICC placed. RN, family, and facility aware. Family requesting transport via [facility]. RN to arrange when ready. LCSW sent clinicals to SNF. LCSW spoke with Jackelyn Poling at [facility] about completing pasarr at facility. RN aware to call report to SNF. COVID negative today.        Final next level of care: Skilled Nursing Facility Barriers to Discharge: Barriers Resolved   Patient Goals and CMS Choice Patient states their goals for this hospitalization and ongoing recovery are:: SNF   Choice offered to / list presented to : Adult Children  Discharge Placement              Patient chooses bed at: Other - please specify in the comment section below: ([place]) Patient to be transferred to facility by: [facility] Name of family member notified: Joey and [facility] Patient and family notified of of transfer: 04/21/20  Discharge Plan and Services                                     Social Determinants of Health (SDOH) Interventions     Readmission Risk Interventions No flowsheet data found.

## 2020-04-21 NOTE — Plan of Care (Signed)

## 2020-04-21 NOTE — Progress Notes (Signed)
PHARMACY CONSULT NOTE FOR:  OUTPATIENT  PARENTERAL ANTIBIOTIC THERAPY (OPAT)  Indication: osteomyelitis  Regimen: Ertapenem 1000 mg IV every 24 hours. Vancomycin 1250 mg IV every 24 hours.  End date: Last day of therapy 05/31/20  IV antibiotic discharge orders are pended. To discharging provider:  please sign these orders via discharge navigator,  Select New Orders & click on the button choice - Manage This Unsigned Work.     Thank you for allowing pharmacy to be a part of this patient's care.  Ramond Craver 04/21/2020, 10:51 AM

## 2020-04-21 NOTE — Progress Notes (Signed)
Renee, RN from vascular access and I are  unable to get phone consent from pt's family members for PICC placement. Renee stated she will continue to try and keep me posted but pt may not be able to discharge until tomorrow or when consent is signed.

## 2020-04-21 NOTE — Progress Notes (Signed)
Peripherally Inserted Central Catheter Placement  The IV Nurse has discussed with the patient and/or persons authorized to consent for the patient, the purpose of this procedure and the potential benefits and risks involved with this procedure.  The benefits include less needle sticks, lab draws from the catheter, and the patient may be discharged home with the catheter. Risks include, but not limited to, infection, bleeding, blood clot (thrombus formation), and puncture of an artery; nerve damage and irregular heartbeat and possibility to perform a PICC exchange if needed/ordered by physician.  Alternatives to this procedure were also discussed.  Bard Power PICC patient education guide, fact sheet on infection prevention and patient information card has been provided to patient /or left at bedside.   Consent obtained via telephone with son [name].  PICC Placement Documentation  PICC Single Lumen 04/21/20 PICC Right Brachial 38 cm 2 cm (Active)  Indication for Insertion or Continuance of Line Home intravenous therapies (PICC only) 04/21/20 1800  Exposed Catheter (cm) 2 cm 04/21/20 1800  Site Assessment Clean;Dry;Intact 04/21/20 1800  Line Status Flushed;Saline locked;Blood return noted 04/21/20 1800  Dressing Type Transparent;Securing device 04/21/20 1800  Dressing Status Clean;Dry;Intact 04/21/20 1800  Antimicrobial disc in place? Yes 04/21/20 1800  Line Care Connections checked and tightened 04/21/20 1800  Dressing Intervention New dressing 04/21/20 1800  Dressing Change Due 04/28/20 04/21/20 1800       Darlyn Read 04/21/2020, 6:56 PM

## 2020-04-21 NOTE — Discharge Summary (Signed)
Physician Discharge Summary  Rachael Johnson SHF:[id number] DOB: [birth year] DOA: 04/16/2020  PCP: Rachael Lukes, MD  Admit date: 04/16/2020 Discharge date: 04/21/2020  Time spent: 35 minutes  Recommendations for Outpatient Follow-up:  1. Repeat basic metabolic panel to follow electrolytes renal function and stability. 2. Assure adequate hydration and nutrition. 3. Repeat CBC to follow hemoglobin stability. 4. Follow instructions for wound care and outpatient antibiotic therapy protocol for antibiotics therapy length and monitoring labs instructions. 5. Further goals of care discussion and advance directive plans recommended.   Discharge Diagnoses:  Principal Problem:   Pressure injury of sacral region, stage 4 (HCC) Active Problems:   Thrombocythemia ([place])   Hypothyroid   Goals of care, counseling/discussion   COPD (chronic obstructive pulmonary disease) ([place])   Osteomyelitis (HCC)   Decubitus ulcer of left heel, unstageable ([place])   Acute encephalopathy   Palliative care by specialist   Severe protein-calorie malnutrition ([place])   Adult failure to thrive   Weakness   Discharge Condition: Stable and with plan to go to a skilled nursing facility for further care and rehabilitation.  CODE STATUS: Full code  Diet recommendation: regular diet/high protein.  Filed Weights   04/17/20 0329 04/18/20 0413 04/19/20 0408  Weight: 64.4 kg 60.4 kg 62.8 kg    History of present illness:  VickieWilsonis a69 y.o.female,with history of CVA with residual left sided weakness, thyroid disease, seizure, rhabdo, restless leg syndrome, GERD, COPD, and more presents to the ED with a chief complaint of painful ulcer. Patient reports that the ulcer started 2 months ago. She says that she has not had any previous treatments for it. It started out small, and has worsened in size and pain over the two months. She reports that propping herself up with a pillow makes the pain better. Palpating or  laying supine make it worse. Patient reports that since her stroke she has only been able to ambulate with a wheelchair. She is not sure when, but the wound has started draining brown, malodorous fluid. Patient reports that she has been feverish with Tmax 102. She has not taken any antipyretics. Patient reports that she has had a cough that also started two months ago. The cough has been improving recently. The cough has been non productive. She reports that she has had dyspnea, but has not had to use more than her 4LNC that is her baseline. She reports no chest pain, palpitations, or peripheral edema. She reports a normal appetite, and normal water intake. Patient does report painful mouth and blisters. She does use steroid inhalers at home.  Hospital Course:  1. Infected sacral decubitus ulcer. Seen by general surgery and debridement done at bedside. She is on broad-spectrum antibiotics. Imaging indicates underlying osteomyelitis.   Discussed with Dr Rachael Johnson with infectious disease with recommendations for 6 weeks of IV vancomycin and ertapenem.    Constant repositioning to offload pressure and topical care as per wound care instructions (see below for details) recommended.  2. Sepsis. Likely related to #1. Sepsis features improving/resolved for discharge.  She met sepsis criteria on admission. Continue antibiotics as mentioned above.  Blood cultures have shown no growth to date.  3. Recently diagnosed superficial thrombus. Followed by oncology and started on Xarelto. Plan is for 2 months of anticoagulation.  Continue outpatient follow-up with oncology service and repeat CBC intermittently to follow hemoglobin trend.  No signs of overt bleeding.  4. Essential thrombocythemia. She is chronically on interferon through oncology. Platelet counts do not appear to  be substantially elevated.  Continue outpatient follow-up with oncology service.  5. Failure to thrive with severe protein  calorie malnutrition. Albumin of 2.0, I have encouraged patient to increase oral intake and to follow instruction by nutritionist for feeding supplements.  She will need high-protein intake to allow wound to heal.   6. Hypokalemia.  -Repleted and patient discharged on daily supplementation -Repeat basic metabolic panel to follow electrolytes stability.  7.Acute encephalopathy. Metabolic work-up is unrevealing.  She does not appear to have any focal deficits.  After discussing with patient's son, appears to be a more chronic process.  Likely has some degree of mild cognitive impairment at baseline and symptoms got substantially worse after recent prolonged admission for pneumonia and now sepsis due to osteomyelitis.  Overall, mental status has improved since admission and patient is able to follow commands appropriately.  Continue to follow constant reorientation and minimize sedative agents.  8.COPD. No complaints of shortness at this time.  Resume home inhaler management.  9.  Constipation.  In the setting of chronic analgesic regimen.  Started on [place] and [place].  Advised to maintain adequate hydration.  10.  Urinary retention.  Likely related to constipation and chronic use of opiates.   11.  Goals of care.  Long discussion with patient's son Rachael Johnson.  Patient has had a significant decline over the past 2 months.  It appears that she did have some mild cognitive impairment prior to this, but was functional and active.  Since her admission approximately 2 months ago for pneumonia which was prolonged, she has had a consistent decline.  Her p.o. intake has been poor and she has been losing weight.  She is developed a significant sacral ulcer that will be very difficult to heal considering her poor nutritional status.  Palliative care consulted with plans for family meeting on 9/21.  Long-term prognosis is poor.  12.  Anemia, likely related to nutritional deficiencies/chronic  disease.  No obvious bleeding. Ferritin/iron did not indicate iron deficiency.  Anemia is macrocytic.  Folic acid is low and was repleted.  B12 level is not low.  Transfused 1 unit of PRBC (hospitalization.  Continue to follow hemoglobin trend.   Procedures:  See below for x-ray reports  Bedside surgical debridement of stage 4 sacral wound.   Consultations:  ID curbside: Dr. [place] Johnson recommended 6 week os IV antibiotics for treatment of sacral osteomyelitis.   General surgery  Palliative care  Discharge Exam: Vitals:   04/20/20 2041 04/21/20 0723  BP: (!) 141/84   Pulse: (!) 115   Resp: 19   Temp: 98.1 F (36.7 C)   SpO2: 98% 98%    General: Chronically ill in appearance, underweight and in no distress.  Oriented x2; following commands appropriately.  No fever. Cardiovascular: S1 and S2, no rubs, no gallops, no JVD. Respiratory: Clear to auscultation bilaterally; normal respiratory effort. Abdomen: Soft, nontender, nondistended, positive bowel sounds. Extremities: No cyanosis or clubbing. Skin: Large stage IV sacral wound; no surrounding erythema appreciated.  Present on admission.   Discharge Instructions   Discharge Instructions    [facility] pharmacist to adjust dose for Vancomycin, Aminoglycosides and other anti-infective therapies as requested by physician.   Complete by: As directed    [facility] to provide Cath Flo $Remove'2mg'EYleqHT$    Complete by: As directed    Administer for PICC line occlusion and as ordered by physician for other access device issues.   Anaphylaxis Kit: Provided to treat any anaphylactic reaction  to the medication being provided to the patient if First Dose or when requested by physician   Complete by: As directed    Epinephrine 1mg /ml vial / amp: Administer 0.3mg  (0.44ml) subcutaneously once for moderate to severe anaphylaxis, nurse to call physician and pharmacy when reaction occurs and call 911 if needed for immediate care    Diphenhydramine 50mg /ml IV vial: Administer 25-50mg  IV/IM PRN for first dose reaction, rash, itching, mild reaction, nurse to call physician and pharmacy when reaction occurs   Sodium Chloride 0.9% NS 533ml IV: Administer if needed for hypovolemic blood pressure drop or as ordered by physician after call to physician with anaphylactic reaction   Change dressing on IV access line weekly and PRN   Complete by: As directed    Diet - low sodium heart healthy   Complete by: As directed    Discharge instructions   Complete by: As directed    Take medications as prescribed Continue regular hydration As your current nutrition and in between meals use of Ensure for higher protein intake. Follow instructions/recommendations for monitoring labs while receiving antibiotics infusions. Plan is to discontinue patient's PICC line after 6 weeks of antibiotic therapy completed. Physical therapy and rehabilitation as per the skilled nursing facility protocol.   Discharge wound care:   Complete by: As directed    Santyl to the wound and then saline dampened gauze and cover with ABD or sacral dressing twice a day. Constant repositioning to offload pressure.   Flush IV access with Sodium Chloride 0.9% and Heparin 10 units/ml or 100 units/ml   Complete by: As directed    Home infusion instructions - [facility]   Complete by: As directed    Instructions: Flush IV access with Sodium Chloride 0.9% and Heparin 10units/ml or 100units/ml   Change dressing on IV access line: Weekly and PRN   Instructions Cath Flo 2mg : Administer for PICC Line occlusion and as ordered by physician for other access device   [facility] pharmacist to adjust dose for: Vancomycin, Aminoglycosides and other anti-infective therapies as requested by physician   Method of administration may be changed at the discretion of home infusion pharmacist based upon assessment of the patient and/or caregiver's ability to  self-administer the medication ordered   Complete by: As directed    Outpatient Parenteral Antibiotic Therapy Information Antibiotic: Ertapenem (Invanz) IVPB, Vancomycin IVPB; Indications for use: Osteomyelitis; End Date: 05/31/2020   Complete by: As directed    Antibiotic:  Ertapenem (Invanz) IVPB Vancomycin IVPB     Indications for use: Osteomyelitis   End Date: 05/31/2020     Allergies as of 04/21/2020   No Known Allergies     Medication List    STOP taking these medications   estradiol 0.5 MG tablet Commonly known as: ESTRACE   fluconazole 150 MG tablet Commonly known as: DIFLUCAN   sodium chloride HYPERTONIC 3 % nebulizer solution   SSD 1 % cream Generic drug: silver sulfADIAZINE     TAKE these medications   acetaminophen 325 MG tablet Commonly known as: TYLENOL Take 2 tablets (650 mg total) by mouth every 6 (six) hours as needed for mild pain or headache (or Fever >/= 101).   albuterol 108 (90 Base) MCG/ACT inhaler Commonly known as: VENTOLIN HFA INHALE 2 PUFFS BY MOUTH EVERY 6 HOURS AS NEEDED FOR WHEEZING OR SHORTNESS OF BREATH   Aller-Chlor 4 MG tablet Generic drug: chlorpheniramine TAKE 2 TABLETS BY MOUTH EVERY NIGHT AT BEDTIME AS NEEDED FOR ALLERGIES  What changed: See the new instructions.   ALPRAZolam 0.25 MG tablet Commonly known as: XANAX Take 1-2 tablets (0.25-0.5 mg total) by mouth every 8 (eight) hours as needed for anxiety. What changed:   medication strength  how much to take  when to take this  reasons to take this  additional instructions   alum & mag hydroxide-simeth 200-200-25 MG chewable tablet Commonly known as: GELUSIL Chew by mouth.   atorvastatin 10 MG tablet Commonly known as: LIPITOR Take 1 tablet (10 mg total) by mouth daily.   B-D 3CC LUER-LOK SYR 25GX1" 25G X 1" 3 ML Misc Generic drug: SYRINGE-NEEDLE (DISP) 3 ML 1 Syringe by Subdermal route once a week.   B-D 3CC LUER-LOK SYR 25GX1" 25G X 1" 3 ML Misc Generic drug:  SYRINGE-NEEDLE (DISP) 3 ML Inject into the skin.   budesonide-formoterol 80-4.5 MCG/ACT inhaler Commonly known as: Symbicort Inhale 2 puffs into the lungs 2 (two) times daily.   busPIRone 15 MG tablet Commonly known as: BUSPAR TAKE (1) TABLET BY MOUTH FOUR TIMES A DAY AS NEEDED What changed: See the new instructions.   collagenase ointment Commonly known as: SANTYL Apply topically 2 (two) times daily. Apply twice a day to sacral decubitus ulcer.   Cranberry 300 MG tablet Take 300 mg by mouth 2 (two) times daily.   cyanocobalamin 1000 MCG/ML injection Commonly known as: (VITAMIN B-12) Inject 1 ml into muscle q week x 4 then q monthly What changed:   how much to take  how to take this  when to take this  additional instructions   cyclobenzaprine 10 MG tablet Commonly known as: FLEXERIL Take 1 tablet (10 mg total) by mouth 2 (two) times daily as needed for muscle spasms.   diphenhydrAMINE 12.5 MG/5ML liquid Commonly known as: EQ Allergy Relief Childrens TAKE 5 ML BY MOUTH THREE TIMES DAILY AS NEEDED FOR MOUTH  PAIN   Ensure Active High Protein Liqd Take 1 bottle TID between meals.   ertapenem  IVPB Commonly known as: INVANZ Inject 1 g into the vein daily. Indication:  osteomyelitis First Dose: Yes Last Day of Therapy:  05/31/2020 Labs - Once weekly:  CBC/D and BMP, Labs - Every other week:  ESR and CRP Method of administration: Mini-Bag Plus / Gravity Method of administration may be changed at the discretion of home infusion pharmacist based upon assessment of the patient and/or caregiver's ability to self-administer the medication ordered. Start taking on: April 22, 2020   famotidine 20 MG tablet Commonly known as: PEPCID Take 20 mg by mouth 2 (two) times daily. What changed: Another medication with the same name was removed. Continue taking this medication, and follow the directions you see here.   fluticasone 50 MCG/ACT nasal spray Commonly known as:  FLONASE INSTILL TWO (2) SPRAYS IN EACH NOSTRIL ONCE DAILY   furosemide 20 MG tablet Commonly known as: LASIX Take 1 tablet (20 mg total) by mouth 2 (two) times daily between meals as needed for fluid or edema. What changed: See the new instructions.   gabapentin 100 MG capsule Commonly known as: NEURONTIN TAKE FOUR (4) CAPSULES BY MOUTH EVERY NIGHT AT BEDTIME What changed:   See the new instructions.  Another medication with the same name was removed. Continue taking this medication, and follow the directions you see here.   levothyroxine 100 MCG tablet Commonly known as: SYNTHROID TAKE 1 TABLET BY MOUTH EVERY MORNING BEFORE BREAKFAST What changed: See the new instructions.   linaclotide 145 MCG Caps capsule  Commonly known as: LINZESS Take 1 capsule (145 mcg total) by mouth daily before breakfast. Start taking on: April 22, 2020   magic mouthwash Soln Take 5 mLs by mouth 3 (three) times daily as needed for mouth pain. Components benadryl  525 mg, hydrocortisone 60 mg and nystatin 0.6 mg. 240 ml - Oral   meclizine 12.5 MG tablet Commonly known as: ANTIVERT TAKE 1 TABLET BY MOUTH THREE TIMES DAILY AS NEEDED FOR DIZZINESS What changed:   how much to take  how to take this  when to take this  reasons to take this  additional instructions   montelukast 10 MG tablet Commonly known as: SINGULAIR TAKE 1 TABLET BY MOUTH EVERY NIGHT AT BEDTIME   MULTIVITAMIN PO Take 1 tablet by mouth every morning.   Nebulizers Misc USE AS DIRECTED   nystatin 100000 UNIT/ML suspension Commonly known as: MYCOSTATIN Take 5 mLs (500,000 Units total) by mouth 4 (four) times daily as needed (thrush). What changed:   when to take this  reasons to take this   ondansetron 8 MG tablet Commonly known as: ZOFRAN TAKE 1/2-1 TABLET BY MOUTH EVERY 8 HOURS AS NEEDED FOR NAUSEA AND VOMITING What changed:   how much to take  how to take this  when to take this  reasons to take  this  additional instructions   optichamber diamond Misc USE as directed. Use with inhaler   oxyCODONE-acetaminophen 10-325 MG tablet Commonly known as: PERCOCET Take 1 tablet by mouth every 8 (eight) hours as needed (severe pain). What changed:   when to take this  reasons to take this   Pegasys 180 MCG/ML injection Generic drug: peginterferon alfa-2a Inject 0.75 mLs (135 mcg total) into the skin every 7 (seven) days.   polyethylene glycol 17 g packet Commonly known as: MIRALAX / GLYCOLAX Take 17 g by mouth daily as needed for mild constipation.   potassium chloride SA 20 MEQ tablet Commonly known as: KLOR-CON Take 1 tablet (20 mEq total) by mouth daily.   Restasis 0.05 % ophthalmic emulsion Generic drug: cycloSPORINE PLACE ONE DROP INTO EACH EYE EVERY 12 HOURS   rivaroxaban 10 MG Tabs tablet Commonly known as: XARELTO Take 1 tablet (10 mg total) by mouth daily.   [place] 3.1 MG/24HR Generic drug: granisetron APPLY 1 PATCH TOPICALLY AS NEEDED FOR NAUSEA, REMOVE AFTER 7 DAYS What changed:   how much to take  how to take this  when to take this  additional instructions   sodium chloride (hypertonic) 3 % solution SMARTSIG:Via Nebulizer   traZODone 100 MG tablet Commonly known as: DESYREL TAKE 1 TABLET BY MOUTH AT BEDTIME   TUBERCULIN SYR 1CC/27GX1/2" 27G X 1/2" 1 ML Misc Commonly known as: B-D TB SYRINGE 1CC/27GX1/2" USE AS DIRECTED ONCE WEEKLY FOR PEG-INTERFERON INJECTIONS   vancomycin  IVPB Inject 1,250 mg into the vein daily. Indication:  osteomyelitis First Dose: Yes Last Day of Therapy:  05/31/2020 Labs - Sunday/Monday:  CBC/D, BMP, and vancomycin trough. Labs - Thursday:  BMP and vancomycin trough Labs - Every other week:  ESR and CRP Method of administration:Elastomeric Method of administration may be changed at the discretion of the patient and/or caregiver's ability to self-administer the medication ordered. Start taking on: April 22, 2020   venlafaxine XR 75 MG 24 hr capsule Commonly known as: EFFEXOR-XR TAKE 1 CAPSULE BY MOUTH THREE TIMES DAILY What changed: when to take this            Discharge Care Instructions  (  From admission, onward)         Start     Ordered   04/21/20 0000  Change dressing on IV access line weekly and PRN  (Home infusion instructions - [facility] )        04/21/20 1230   04/21/20 0000  Discharge wound care:       Comments: Santyl to the wound and then saline dampened gauze and cover with ABD or sacral dressing twice a day. Constant repositioning to offload pressure.   04/21/20 1230         No Known Allergies  Follow-up Information    Rachael Lukes, MD. Schedule an appointment as soon as possible for a visit in 2 week(s).   Specialty: Family Medicine Why: after discharge from SNF Contact information: [address] [place] [address] [phone]               The results of significant diagnostics from this hospitalization (including imaging, microbiology, ancillary and laboratory) are listed below for reference.    Significant Diagnostic Studies: DG Chest 1 View  Result Date: 04/16/2020 CLINICAL DATA:  Sacral ulcer EXAM: CHEST  1 VIEW COMPARISON:  03/05/2020 FINDINGS: Scattered opacities within both lungs. No large consolidation. No pleural effusion or pneumothorax. Normal cardiomediastinal contours. IMPRESSION: Scattered opacities within both lungs, likely indicating infection. Electronically Signed   By: Ulyses Jarred M.D.   On: 04/16/2020 23:53   DG Pelvis 1-2 Views  Result Date: 04/16/2020 CLINICAL DATA:  Sacral ulcer EXAM: PELVIS - 1-2 VIEW COMPARISON:  None. FINDINGS: There is no evidence of pelvic fracture or diastasis. No pelvic bone lesions are seen. IMPRESSION: Negative. Electronically Signed   By: Ulyses Jarred M.D.   On: 04/16/2020 23:46   DG Sacrum/Coccyx  Result Date: 04/16/2020 CLINICAL DATA:  Sacral ulcer EXAM:  SACRUM AND COCCYX - 2+ VIEW COMPARISON:  None. FINDINGS: There is subcutaneous gas that abuts the coccyx on the lateral projection. There is no osteolysis. There is nearby soft tissue ulceration. IMPRESSION: Subcutaneous gas abuts the coccyx on the lateral projection with nearby soft tissue ulceration. This likely indicates osseous exposure to the external environment. Electronically Signed   By: Ulyses Jarred M.D.   On: 04/16/2020 23:48   CT CHEST W CONTRAST  Result Date: 04/19/2020 CLINICAL DATA:  Fever, cough, pneumonia EXAM: CT CHEST WITH CONTRAST TECHNIQUE: Multidetector CT imaging of the chest was performed during intravenous contrast administration. CONTRAST:  22m OMNIPAQUE IOHEXOL 300 MG/ML  SOLN COMPARISON:  04/17/2020, 04/16/2020, 03/03/2020 FINDINGS: Cardiovascular: The heart is unremarkable without pericardial effusion. Normal caliber of the thoracic aorta. No evidence of dissection. Minimal atherosclerosis of the aortic arch. Mediastinum/Nodes: No enlarged mediastinal, hilar, or axillary lymph nodes. Thyroid gland, trachea, and esophagus demonstrate no significant findings. Small hiatal hernia. Lungs/Pleura: There are trace bilateral pleural effusions, right greater than left. Wedge-shaped area of peripheral consolidation within the right upper lobe measuring 2.7 x 2.0 cm likely reflects residual atelectasis. There are bilateral areas of subpleural scarring and fibrosis. No acute airspace disease or pneumothorax. Stable bilateral bronchiectasis. Central airways are patent. Upper Abdomen: Diffuse hepatic steatosis. No acute upper abdominal process. Musculoskeletal: No acute displaced fracture. Reconstructed images demonstrate no additional findings. IMPRESSION: 1. Wedge-shaped consolidation right upper lobe likely reflects residual atelectasis. Continued follow-up recommended. 2. Trace bilateral pleural effusions. 3. Chronic bilateral areas of scarring and bronchiectasis. 4. Hepatic steatosis. 5.   Aortic Atherosclerosis (ICD10-I70.0). Electronically Signed   By: MRanda Ngo  M.D.   On: 04/19/2020 21:01   CT ABDOMEN PELVIS W CONTRAST  Result Date: 04/17/2020 CLINICAL DATA:  69 year old female with a history of left lower quadrant pain, concerning for intra-abdominal abscess EXAM: CT ABDOMEN AND PELVIS WITH CONTRAST TECHNIQUE: Multidetector CT imaging of the abdomen and pelvis was performed using the standard protocol following bolus administration of intravenous contrast. CONTRAST:  15m OMNIPAQUE IOHEXOL 300 MG/ML  SOLN COMPARISON:  07/10/2018 FINDINGS: Lower chest: Atelectasis/scarring at the bilateral lung bases. No acute finding at the lung bases. Hepatobiliary: Diffusely decreased attenuation of liver parenchyma. Unremarkable gallbladder. Pancreas: Unremarkable Spleen: Unremarkable Adrenals/Urinary Tract: - Right adrenal gland:  Unremarkable - Left adrenal gland: Unremarkable. - Right kidney: No hydronephrosis, nephrolithiasis, inflammation, or ureteral dilation. Low-density lesion on the lateral cortex of the right kidney, unchanged from the prior, compatible with a benign cyst. - Left Kidney: No hydronephrosis, nephrolithiasis, inflammation, or ureteral dilation. No focal lesion. - Urinary Bladder: Urinary bladder distended. Stomach/Bowel: - Stomach: Unremarkable. - Small bowel: Unremarkable.  Enteric contrast reaches distal bowel. - Appendix: Normal appendix - Colon: Moderate two large formed stool burden. No focal inflammatory changes. No evidence of transition point. Vascular/Lymphatic: Minimal atherosclerotic changes of the abdominal aorta. Unremarkable portal venous system. Unremarkable systemic venous system. Reproductive: Hysterectomy. Other: Incidental imaging of the left arm demonstrates extravasation of contrast within the medial soft tissues, between the muscular compartment in the superficial soft tissues. Edema/inflammatory changes of the left upper extremity in this region, which is  incompletely imaged. Musculoskeletal: Soft tissue defect within the sacral region overlying the lower sacrum and the coccyx. The depth appears to extend to the cortex/periosteum. No abscess present within the superficial soft tissues. No intra-abdominal abscess identified. IMPRESSION: CT is negative for intra-abdominal abscess. No finding that would account for left lower quadrant pain. Moderate to large formed stool burden. Recommend correlation with any history of constipation. No evidence of bowel obstruction. Steatosis. Sacral decubitus wound, compatible with given history. Distention of the urinary bladder, perhaps incidental. Extravasation of contrast within the medial left upper arm. Correlation with physical exam and history of known contrast extravasation may be useful. Additional ancillary findings as above. Electronically Signed   By: JCorrie MckusickD.O.   On: 04/17/2020 11:14   DG Foot Complete Right  Result Date: 04/16/2020 CLINICAL DATA:  Heel ulcer EXAM: RIGHT FOOT COMPLETE - 3+ VIEW COMPARISON:  None. FINDINGS: There is no evidence of fracture or dislocation. There is no evidence of arthropathy or other focal bone abnormality. Superficial ulceration at the heel IMPRESSION: Superficial ulceration at the heel. No radiographic evidence of osteomyelitis. Electronically Signed   By: KUlyses JarredM.D.   On: 04/16/2020 23:49   UK[place]EKG SITE RITE  Result Date: 04/20/2020 If Site Rite image not attached, placement could not be confirmed due to current cardiac rhythm.   Microbiology: Recent Results (from the past 240 hour(s))  SARS Coronavirus 2 by RT PCR (hospital order[facility] Nasopharyngeal Nasopharyngeal Swab     Status: None   Collection Time: 04/16/20  7:39 PM   Specimen: Nasopharyngeal Swab  Result Value Ref Range Status   SARS Coronavirus 2 NEGATIVE NEGATIVE Final    Comment: (NOTE) SARS-CoV-2 target nucleic acids are NOT DETECTED.  The SARS-CoV-2 RNA  is generally detectable in upper and lower respiratory specimens during the acute phase of infection. The lowest concentration of SARS-CoV-2 viral copies this assay can detect is 250 copies / mL. A negative result does not preclude SARS-CoV-2  infection and should not be used as the sole basis for treatment or other patient management decisions.  A negative result may occur with improper specimen collection / handling, submission of specimen other than nasopharyngeal swab, presence of viral mutation(s) within the areas targeted by this assay, and inadequate number of viral copies (<250 copies / mL). A negative result must be combined with clinical observations, patient history, and epidemiological information.  Fact Sheet for Patients:   StrictlyIdeas.no  Fact Sheet for Healthcare Providers: BankingDealers.co.za  This test is not yet approved or  cleared by the [place] FDA and has been authorized for detection and/or diagnosis of SARS-CoV-2 by FDA under an Emergency Use Authorization (EUA).  This EUA will remain in effect (meaning this test can be used) for the duration of the COVID-19 declaration under Section 564(b)(1) of the Act, 21 U.S.C. section 360bbb-3(b)(1), unless the authorization is terminated or revoked sooner.  Performed at [facility], [address], [place], [place] [address]   Culture, blood (routine x 2)     Status: None   Collection Time: 04/16/20  9:16 PM   Specimen: Right Antecubital; Blood  Result Value Ref Range Status   Specimen Description RIGHT ANTECUBITAL  Final   Special Requests   Final    BOTTLES DRAWN AEROBIC AND ANAEROBIC Blood Culture adequate volume   Culture   Final    NO GROWTH 5 DAYS Performed at [facility], [address] [place], [place] [address]    Report Status 04/21/2020 FINAL  Final  Culture, blood (routine x 2)     Status: None   Collection Time: 04/16/20  9:25 PM   Specimen:  BLOOD RIGHT HAND  Result Value Ref Range Status   Specimen Description BLOOD RIGHT HAND  Final   Special Requests   Final    BOTTLES DRAWN AEROBIC AND ANAEROBIC Blood Culture adequate volume   Culture   Final    NO GROWTH 5 DAYS Performed at [facility], [address] [place], [place] [address]    Report Status 04/21/2020 FINAL  Final  Aerobic/Anaerobic Culture (surgical/deep wound)     Status: None (Preliminary result)   Collection Time: 04/18/20  7:27 PM   Specimen: Sacral  Result Value Ref Range Status   Specimen Description   Final    SACRAL Performed at [facility], [address], [place], [place] [address]    Special Requests   Final    NONE Performed at [facility], [address], [place], [place] [address]    Gram Stain   Final    RARE WBC PRESENT, PREDOMINANTLY PMN RARE GRAM POSITIVE COCCI IN PAIRS    Culture   Final    CULTURE REINCUBATED FOR BETTER GROWTH Performed at [facility], [place] [address], [place], [place] [address]    Report Status PENDING  Incomplete     Labs: Basic Metabolic Panel: Recent Labs  Lab 04/16/20 1948 04/16/20 2038 04/17/20 0630 04/18/20 0812 04/19/20 1101 04/20/20 0420 04/21/20 0101  NA  --    < > 138 139 136 134* 128*  K  --    < > 3.6 3.3* 2.9* 3.3* 2.6*  CL  --    < > 99 102 103 103 98  CO2  --    < > _0 GLUCOSE  --    < > 106* 101* 104* 85 86  BUN  --    < > 25* _1 7*  CREATININE  --    < > 0.82 0.70 0.51 0.41* 0.44  CALCIUM  --    < > 8.2* 8.2* 7.5* 7.6* 7.5*  MG 2.1  --  2.0  --   --   --   --   PHOS  --   --  2.8  --   --   --   --    < > = values in this interval not displayed.   Liver Function Tests: Recent Labs  Lab 04/16/20 2038 04/17/20 0630 04/18/20 0812 04/19/20 1101 04/21/20 0101  AST 78* 72* 58* 56* 62*  ALT _0 ALKPHOS 182* 156* 161* 166* 182*  BILITOT 1.1 0.7 0.8 0.6 0.7  PROT 7.2 6.4* 5.9* 5.2* 6.0*  ALBUMIN 2.5* 2.2* 2.0* 1.8* 2.0*    Recent Labs  Lab  04/18/20 0812  AMMONIA 18   CBC: Recent Labs  Lab 04/16/20 2038 04/16/20 2038 04/17/20 0630 04/17/20 0630 04/18/20 0812 04/19/20 1101 04/20/20 0420 04/20/20 1357 04/21/20 0101  WBC 13.4*   < > 11.8*  --  12.2* 8.7 7.5  --  8.3  NEUTROABS 11.4*  --  10.0*  --   --   --   --   --   --   HGB 9.3*   < > 8.6*   < > 8.4* 7.1* 7.6* 10.4* 10.1*  HCT 31.0*   < > 29.1*   < > 28.2* 24.6* 25.4* 33.2* 31.6*  MCV 106.2*   < > 107.0*  --  108.0* 108.8* 106.3*  --  99.7  PLT 475*   < > 427*  --  451* 515* 554*  --  586*   < > = values in this interval not displayed.   CBG: Recent Labs  Lab 04/18/20 2026  GLUCAP 103*   Signed:  Barton Dubois MD.  [facility] 04/21/2020, 12:35 PM

## 2020-04-22 DIAGNOSIS — D649 Anemia, unspecified: Secondary | ICD-10-CM | POA: Diagnosis not present

## 2020-04-22 DIAGNOSIS — R627 Adult failure to thrive: Secondary | ICD-10-CM | POA: Diagnosis not present

## 2020-04-22 DIAGNOSIS — K5900 Constipation, unspecified: Secondary | ICD-10-CM | POA: Diagnosis not present

## 2020-04-22 DIAGNOSIS — I8290 Acute embolism and thrombosis of unspecified vein: Secondary | ICD-10-CM | POA: Diagnosis not present

## 2020-04-22 DIAGNOSIS — J449 Chronic obstructive pulmonary disease, unspecified: Secondary | ICD-10-CM | POA: Diagnosis not present

## 2020-04-22 DIAGNOSIS — D693 Immune thrombocytopenic purpura: Secondary | ICD-10-CM | POA: Diagnosis not present

## 2020-04-22 DIAGNOSIS — E43 Unspecified severe protein-calorie malnutrition: Secondary | ICD-10-CM | POA: Diagnosis not present

## 2020-04-22 DIAGNOSIS — L89159 Pressure ulcer of sacral region, unspecified stage: Secondary | ICD-10-CM | POA: Diagnosis not present

## 2020-04-22 LAB — TYPE AND SCREEN
ABO/RH(D): O POS
Antibody Screen: NEGATIVE
Unit division: 0

## 2020-04-22 LAB — BPAM RBC
Blood Product Expiration Date: 202110202359
ISSUE DATE / TIME: 202109210749
Unit Type and Rh: 5100

## 2020-04-25 LAB — AEROBIC/ANAEROBIC CULTURE W GRAM STAIN (SURGICAL/DEEP WOUND): Culture: NORMAL

## 2020-04-28 DIAGNOSIS — E43 Unspecified severe protein-calorie malnutrition: Secondary | ICD-10-CM | POA: Diagnosis not present

## 2020-04-28 DIAGNOSIS — M6281 Muscle weakness (generalized): Secondary | ICD-10-CM | POA: Diagnosis not present

## 2020-04-28 DIAGNOSIS — L89616 Pressure-induced deep tissue damage of right heel: Secondary | ICD-10-CM | POA: Diagnosis not present

## 2020-04-28 DIAGNOSIS — L89324 Pressure ulcer of left buttock, stage 4: Secondary | ICD-10-CM | POA: Diagnosis not present

## 2020-04-29 ENCOUNTER — Inpatient Hospital Stay: Payer: Medicare Other | Admitting: Pulmonary Disease

## 2020-04-29 DIAGNOSIS — L89159 Pressure ulcer of sacral region, unspecified stage: Secondary | ICD-10-CM | POA: Diagnosis not present

## 2020-04-29 DIAGNOSIS — D693 Immune thrombocytopenic purpura: Secondary | ICD-10-CM | POA: Diagnosis not present

## 2020-04-29 DIAGNOSIS — E43 Unspecified severe protein-calorie malnutrition: Secondary | ICD-10-CM | POA: Diagnosis not present

## 2020-04-29 DIAGNOSIS — E860 Dehydration: Secondary | ICD-10-CM | POA: Diagnosis not present

## 2020-04-29 DIAGNOSIS — I8290 Acute embolism and thrombosis of unspecified vein: Secondary | ICD-10-CM | POA: Diagnosis not present

## 2020-04-29 DIAGNOSIS — J449 Chronic obstructive pulmonary disease, unspecified: Secondary | ICD-10-CM | POA: Diagnosis not present

## 2020-04-29 DIAGNOSIS — D649 Anemia, unspecified: Secondary | ICD-10-CM | POA: Diagnosis not present

## 2020-04-29 DIAGNOSIS — R627 Adult failure to thrive: Secondary | ICD-10-CM | POA: Diagnosis not present

## 2020-04-30 DIAGNOSIS — L89159 Pressure ulcer of sacral region, unspecified stage: Secondary | ICD-10-CM | POA: Diagnosis not present

## 2020-05-03 MED FILL — PEGASYS 180 MCG/ML VIAL: 180 | 28 days supply | Qty: 4 | Fill #5

## 2020-05-03 MED FILL — BD TB SYRINGE 27GX1/2: 27G X 1/2" | 28 days supply | Qty: 4 | Fill #6

## 2020-05-04 ENCOUNTER — Emergency Department (HOSPITAL_COMMUNITY)
Admission: EM | Admit: 2020-05-04 | Discharge: 2020-05-04 | Disposition: A | Discharge status: Home / Self Care | Payer: Medicare Other | Attending: Emergency Medicine | Admitting: Emergency Medicine

## 2020-05-04 ENCOUNTER — Encounter (HOSPITAL_COMMUNITY): Payer: Self-pay | Admitting: Emergency Medicine

## 2020-05-04 ENCOUNTER — Other Ambulatory Visit: Payer: Self-pay

## 2020-05-04 DIAGNOSIS — J449 Chronic obstructive pulmonary disease, unspecified: Secondary | ICD-10-CM | POA: Insufficient documentation

## 2020-05-04 DIAGNOSIS — Z87891 Personal history of nicotine dependence: Secondary | ICD-10-CM | POA: Insufficient documentation

## 2020-05-04 DIAGNOSIS — T83091A Other mechanical complication of indwelling urethral catheter, initial encounter: Secondary | ICD-10-CM | POA: Insufficient documentation

## 2020-05-04 DIAGNOSIS — Z466 Encounter for fitting and adjustment of urinary device: Secondary | ICD-10-CM | POA: Diagnosis not present

## 2020-05-04 DIAGNOSIS — E039 Hypothyroidism, unspecified: Secondary | ICD-10-CM | POA: Diagnosis not present

## 2020-05-04 DIAGNOSIS — I10 Essential (primary) hypertension: Secondary | ICD-10-CM | POA: Insufficient documentation

## 2020-05-04 DIAGNOSIS — T839XXA Unspecified complication of genitourinary prosthetic device, implant and graft, initial encounter: Secondary | ICD-10-CM

## 2020-05-04 DIAGNOSIS — Z79890 Hormone replacement therapy (postmenopausal): Secondary | ICD-10-CM | POA: Insufficient documentation

## 2020-05-04 DIAGNOSIS — R000 Tachycardia, unspecified: Secondary | ICD-10-CM | POA: Diagnosis not present

## 2020-05-04 NOTE — ED Provider Notes (Signed)
[facility] EMERGENCY DEPARTMENT Provider Note   CSN: [id number] Arrival date & time: 05/04/20  2118     History Chief Complaint  Patient presents with  . Foley catheter problem    Rachael Johnson is a 69 y.o. female.  HPI   The patient accidentally pulled out the Foley catheter, she presents from her nursing facility with request for catheter placement given the hour of the evening.  The patient has no other complaints  Past Medical History:  Diagnosis Date  . Acute renal insufficiency 04/09/2017  . Anemia, iron deficiency 07/08/2012  . Arthritis   . Cataract 12/21/2014  . COPD (chronic obstructive pulmonary disease) ([place]) 01/23/2017   01/23/2017   Walked RA  2 laps @ 185 ft each stopped due to  Cough > > sob/ no desat nl pace  . Depression with anxiety 03/24/2011  . Diverticulitis   . Esophageal reflux 08/17/2013  . Essential thrombocythemia ([place]) 01/24/2011  . Frequent episodic tension-type headache   . Gastroenteritis 12/21/2014  . 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 GERD (gastroesophageal reflux disease)   . H. pylori infection 12/19/2012  . Hyperglycemia 12/17/2015  . Hypertension   . Iron deficiency anemia due to chronic blood loss 04/03/2017  . Osteoarthritis 05/24/2017  . Other and unspecified hyperlipidemia 02/25/2013  . Restless leg syndrome 09/30/2014  . Rhabdomyolysis 02/2017  . Scabies 03/28/2015  . 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 Seizure ([place])    childhood  . Shoulder wound, right, sequela 03/14/2017  . Thyroid disease     Patient Active Problem List   Diagnosis Date Noted  . Severe protein-calorie malnutrition ([place])   . Adult failure to thrive   . Weakness   . Acute encephalopathy   . Palliative care by specialist   . Osteomyelitis ([place]) 04/17/2020  . Pressure injury of sacral region, stage 4 ([place]) 04/17/2020  . Decubitus ulcer of left heel, unstageable ([place]) 04/17/2020  . Pernicious anemia 10/31/2019  . Hypokalemia 10/16/2019  . Insomnia 02/05/2019  .  Urinary frequency 06/19/2018  . 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 Sinusitis 11/05/2017  . Chronic back pain 09/09/2017  . Osteoarthritis 05/24/2017  . Iron deficiency anemia due to chronic blood loss 04/03/2017  . Hypomagnesemia 03/18/2017  . Right shoulder pain 03/14/2017  . LFTs abnormal 03/13/2017  . Hypernatremia 03/13/2017  . Elevated LFTs   . COPD (chronic obstructive pulmonary disease) ([place]) 01/23/2017  . Lung nodule 12/18/2016  . Bronchiectasis assoc with MPNs 11/28/2016  . Cough variant asthma vs UACS 11/27/2016  . Vitamin D deficiency 03/07/2016  . Dysuria 03/07/2016  . Essential hypertension 03/07/2016  . Benign paroxysmal positional vertigo 03/07/2016  . Hyperglycemia 12/17/2015  . Cataract 12/21/2014  . Restless leg syndrome 09/30/2014  . Allergies 04/27/2014  . Constipation 11/24/2013  . Goals of care, counseling/discussion 04/20/2013  . Hyperlipidemia 02/25/2013  . H. pylori infection 12/19/2012  . Umbilical hernia 40/98/1191  . Macrocytic anemia 07/08/2012  . Weight loss 12/29/2011  . Depression with anxiety 03/24/2011  . 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 Hypothyroid 02/06/2011  . Thrombocythemia 01/24/2011    Past Surgical History:  Procedure Laterality Date  . ABDOMINAL HYSTERECTOMY     1992  . BREAST BIOPSY    . BREAST CYST ASPIRATION    . BREAST SURGERY  1992   biopsy, benign. Fibrocystic  . UMBILICAL HERNIA REPAIR N/A 09/25/2012   Procedure: HERNIA REPAIR UMBILICAL ADULT;  Surgeon: Harl Bowie, MD;  Location: [facility] ORS;  Service: General;  Laterality:  N/A;     OB History    Gravida  3   Para  2   Term      Preterm      AB  1   Living  2     SAB  1   TAB      Ectopic      Multiple      Live Births              Family History  Problem Relation Age of Onset  . Arthritis Mother   . Cancer Mother        ovarian  . Hypertension Mother   . Heart disease Mother        pacer  . Heart failure Mother   . Arthritis  Father   . Cancer Father        lung  . Hypertension Father   . Cancer Sister        lung  . Cancer Brother        prostate  . Cancer Brother        lung  . Hypertension Son   . COPD Brother   . Heart disease Brother        died from CHF  . Hypertension Brother   . Cancer Brother        colon  . Alcohol abuse Brother   . Cirrhosis Brother   . Seizures Sister   . Stroke Sister   . Arthritis Brother     Social History   Tobacco Use  . Smoking status: Former Smoker    Packs/day: 3.50    Years: 20.00    Pack years: 70.00    Types: Cigarettes    Start date: 11/15/1970    Quit date: 07/31/1990    Years since quitting: 29.7  . Smokeless tobacco: Never Used  . Tobacco comment: quit 23 years ago  Vaping Use  . Vaping Use: Never used  Substance Use Topics  . Alcohol use: No    Alcohol/week: 0.0 standard drinks  . Drug use: No    Home Medications Prior to Admission medications   Medication Sig Start Date End Date Taking? Authorizing Provider  acetaminophen (TYLENOL) 325 MG tablet Take 2 tablets (650 mg total) by mouth every 6 (six) hours as needed for mild pain or headache (or Fever >/= 101). 04/21/20   Barton Dubois, MD  albuterol (VENTOLIN HFA) 108 (90 Base) MCG/ACT inhaler INHALE 2 PUFFS BY MOUTH EVERY 6 HOURS AS NEEDED FOR WHEEZING OR SHORTNESS OF BREATH 06/09/19   Mosie Lukes, MD  ALLER-CHLOR 4 MG tablet TAKE 2 TABLETS BY MOUTH EVERY NIGHT AT BEDTIME AS NEEDED FOR ALLERGIES 04/20/20   Mosie Lukes, MD  ALPRAZolam Duanne Moron) 0.25 MG tablet Take 1-2 tablets (0.25-0.5 mg total) by mouth every 8 (eight) hours as needed for anxiety. 04/21/20   Barton Dubois, MD  alum & mag hydroxide-simeth (GELUSIL) [phone] MG chewable tablet Chew by mouth. 04/08/19   [provider]  atorvastatin (LIPITOR) 10 MG tablet Take 1 tablet (10 mg total) by mouth daily. 05/30/19   Mosie Lukes, MD  B-D 3CC LUER-LOK SYR 25GX1" 25G X 1" 3 ML MISC Inject into the skin. 02/23/20   [provider]  budesonide-formoterol (SYMBICORT) 80-4.5 MCG/ACT inhaler Inhale 2 puffs into the lungs 2 (two) times daily. 06/09/19   Mosie Lukes, MD  busPIRone (BUSPAR) 15 MG tablet TAKE (1) TABLET BY MOUTH FOUR TIMES A DAY AS  NEEDED Patient taking differently: Take 15 mg by mouth 4 (four) times daily as needed.  12/12/19   Mosie Lukes, MD  collagenase (SANTYL) ointment Apply topically 2 (two) times daily. Apply twice a day to sacral decubitus ulcer. 04/21/20   Barton Dubois, MD  Cranberry 300 MG tablet Take 300 mg by mouth 2 (two) times daily.    [provider]  cyanocobalamin (,VITAMIN B-12,) 1000 MCG/ML injection Inject 1 ml into muscle q week x 4 then q monthly Patient taking differently: Inject 1,000 mcg into the muscle every 30 (thirty) days.  10/31/19   [name], MD  cyclobenzaprine (FLEXERIL) 10 MG tablet Take 1 tablet (10 mg total) by mouth 2 (two) times daily as needed for muscle spasms. 01/30/20   Mosie Lukes, MD  diphenhydrAMINE (EQ ALLERGY RELIEF CHILDRENS) 12.5 MG/5ML liquid TAKE 5 ML BY MOUTH THREE TIMES DAILY AS NEEDED FOR MOUTH  PAIN 10/10/19   Mosie Lukes, MD  ertapenem [facility]) IVPB Inject 1 g into the vein daily. Indication:  osteomyelitis First Dose: Yes Last Day of Therapy:  05/31/2020 Labs - Once weekly:  CBC/D and BMP, Labs - Every other week:  ESR and CRP Method of administration: Mini-Bag Plus / Gravity Method of administration may be changed at the discretion of home infusion pharmacist based upon assessment of the patient and/or caregiver's ability to self-administer the medication ordered. 04/22/20 05/31/20  Barton Dubois, MD  famotidine (PEPCID) 20 MG tablet Take 20 mg by mouth 2 (two) times daily. 03/04/20   [provider]  fluticasone (FLONASE) 50 MCG/ACT nasal spray INSTILL TWO (2) SPRAYS IN EACH NOSTRIL ONCE DAILY 08/21/19   Mosie Lukes, MD  furosemide (LASIX) 20 MG tablet Take 1 tablet (20 mg total) by mouth 2 (two) times  daily between meals as needed for fluid or edema. 04/21/20   Barton Dubois, MD  gabapentin (NEURONTIN) 100 MG capsule TAKE FOUR (4) CAPSULES BY MOUTH EVERY NIGHT AT BEDTIME Patient taking differently: Take 400 mg by mouth at bedtime.  09/26/19   Mosie Lukes, MD  granisetron (SANCUSO) 3.1 MG/24HR APPLY 1 PATCH TOPICALLY AS NEEDED FOR NAUSEA, REMOVE AFTER 7 DAYS Patient taking differently: Place 1 patch onto the skin every 7 (seven) days.  10/08/19   [name], MD  levothyroxine (SYNTHROID) 100 MCG tablet TAKE 1 TABLET BY MOUTH EVERY MORNING BEFORE BREAKFAST Patient taking differently: Take 100 mcg by mouth daily before breakfast.  01/12/20   Mosie Lukes, MD  linaclotide [facility]) 145 MCG CAPS capsule Take 1 capsule (145 mcg total) by mouth daily before breakfast. 04/22/20   Barton Dubois, MD  magic mouthwash SOLN Take 5 mLs by mouth 3 (three) times daily as needed for mouth pain. Components benadryl  525 mg, hydrocortisone 60 mg and nystatin 0.6 mg. 240 ml - Oral 04/08/19   Ennever, Rudell Cobb, MD  meclizine (ANTIVERT) 12.5 MG tablet TAKE 1 TABLET BY MOUTH THREE TIMES DAILY AS NEEDED FOR DIZZINESS Patient taking differently: Take 12.5 mg by mouth 3 (three) times daily as needed for dizziness.  09/26/19   Mosie Lukes, MD  montelukast (SINGULAIR) 10 MG tablet TAKE 1 TABLET BY MOUTH EVERY NIGHT AT BEDTIME 04/20/20   Mosie Lukes, MD  Multiple Vitamin (MULTIVITAMIN PO) Take 1 tablet by mouth every morning.     [provider]  Nebulizers MISC USE AS DIRECTED 02/16/20   [provider]  Nutritional Supplements (ENSURE ACTIVE HIGH PROTEIN) LIQD Take 1  bottle TID between meals. 04/21/20   Barton Dubois, MD  nystatin (MYCOSTATIN) 100000 UNIT/ML suspension Take 5 mLs (500,000 Units total) by mouth 4 (four) times daily as needed (thrush). 04/21/20   Barton Dubois, MD  ondansetron (ZOFRAN) 8 MG tablet TAKE 1/2-1 TABLET BY MOUTH EVERY 8 HOURS AS NEEDED FOR NAUSEA AND VOMITING Patient  taking differently: Take 4-8 mg by mouth every 8 (eight) hours as needed for nausea or vomiting.  08/15/19   Mosie Lukes, MD  oxyCODONE-acetaminophen (PERCOCET) 10-325 MG tablet Take 1 tablet by mouth every 8 (eight) hours as needed (severe pain). 04/21/20   Barton Dubois, MD  peginterferon alfa-2a (PEGASYS) 180 MCG/ML injection Inject 0.75 mLs (135 mcg total) into the skin every 7 (seven) days. 03/12/20   [name], MD  polyethylene glycol (MIRALAX / GLYCOLAX) 17 g packet Take 17 g by mouth daily as needed for mild constipation. 04/21/20   Barton Dubois, MD  potassium chloride SA (KLOR-CON) 20 MEQ tablet Take 1 tablet (20 mEq total) by mouth daily. 04/21/20   Barton Dubois, MD  RESTASIS 0.05 % ophthalmic emulsion PLACE ONE DROP INTO [facility] EYE EVERY 12 HOURS 06/09/19   Mosie Lukes, MD  rivaroxaban (XARELTO) 10 MG TABS tablet Take 1 tablet (10 mg total) by mouth daily. 03/18/20   [name], MD  sodium chloride, hypertonic, 3 % solution SMARTSIG:Via Nebulizer 02/16/20   [provider]  Spacer/Aero-Holding Chambers (Moosic) MISC USE as directed. Use with inhaler 05/13/19   Mosie Lukes, MD  SYRINGE-NEEDLE, DISP, 3 ML (B-D 3CC LUER-LOK SYR 25GX1") 25G X 1" 3 ML MISC 1 Syringe by Subdermal route once a week. 12/02/19   [provider]  traZODone (DESYREL) 100 MG tablet TAKE 1 TABLET BY MOUTH AT BEDTIME Patient taking differently: Take 100 mg by mouth at bedtime.  09/26/19   Mosie Lukes, MD  TUBERCULIN SYR 1CC/27GX1/2" (B-D TB SYRINGE 1CC/27GX1/2") 27G X 1/2" 1 ML MISC USE AS DIRECTED ONCE WEEKLY FOR PEG-INTERFERON INJECTIONS 09/09/19   [name], MD  vancomycin IVPB Inject 1,250 mg into the vein daily. Indication:  osteomyelitis First Dose: Yes Last Day of Therapy:  05/31/2020 Labs - Sunday/Monday:  CBC/D, BMP, and vancomycin trough. Labs - Thursday:  BMP and vancomycin trough Labs - Every other week:  ESR and CRP Method of  administration:Elastomeric Method of administration may be changed at the discretion of the patient and/or caregiver's ability to self-administer the medication ordered. 04/22/20 05/31/20  Barton Dubois, MD  venlafaxine XR (EFFEXOR-XR) 75 MG 24 hr capsule TAKE 1 CAPSULE BY MOUTH THREE TIMES DAILY Patient taking differently: Take 75 mg by mouth in the morning, at noon, and at bedtime.  09/26/19   Mosie Lukes, MD    Allergies    Patient has no known allergies.  Review of Systems   Review of Systems  Constitutional: Negative for fever.  Gastrointestinal: Negative for abdominal pain.    Physical Exam Updated Vital Signs BP (!) 151/88   Pulse (!) 125   Temp 98.1 F (36.7 C)   Resp 16   Ht 1.626 m ('5\' 4"' )   Wt 62.8 kg   LMP 07/31/1990   SpO2 98%   BMI 23.76 kg/m   Physical Exam Vitals and nursing note reviewed.  Constitutional:      Appearance: She is well-developed. She is not diaphoretic.  HENT:     Head: Normocephalic and atraumatic.  Eyes:     General:  Right eye: No discharge.        Left eye: No discharge.     Conjunctiva/sclera: Conjunctivae normal.  Pulmonary:     Effort: Pulmonary effort is normal. No respiratory distress.  Genitourinary:    Comments: Chaperone present, genital structures appear normal, no bleeding Skin:    General: Skin is warm and dry.     Findings: No erythema or rash.  Neurological:     Mental Status: She is alert.     Coordination: Coordination normal.     ED Results / Procedures / Treatments   Labs (all labs ordered are listed, but only abnormal results are displayed) Labs Reviewed - No data to display  EKG None  Radiology No results found.  Procedures Procedures (including critical care time)  Medications Ordered in ED Medications - No data to display  ED Course  I have reviewed the triage vital signs and the nursing notes.  Pertinent labs & imaging results that were available during my care of the patient  were reviewed by me and considered in my medical decision making (see chart for details).    MDM Rules/Calculators/A&P                          Not tachycardic on my exam Well appearing Stable for d/c with cathere in place by nusring.  Final Clinical Impression(s) / ED Diagnoses Final diagnoses:  Foley catheter problem, initial encounter [facility])    Rx / DC Orders ED Discharge Orders    None       Noemi Chapel, MD 05/04/20 2138

## 2020-05-04 NOTE — ED Triage Notes (Signed)
[facility] - pt sent from [facility] after she pulled her catheter out this morning.

## 2020-05-04 NOTE — Discharge Instructions (Signed)
ER for worsening symptoms  New Catheter has been placed.

## 2020-05-05 ENCOUNTER — Telehealth: Payer: Self-pay | Admitting: *Deleted

## 2020-05-05 DIAGNOSIS — M6281 Muscle weakness (generalized): Secondary | ICD-10-CM | POA: Diagnosis not present

## 2020-05-05 DIAGNOSIS — L89616 Pressure-induced deep tissue damage of right heel: Secondary | ICD-10-CM | POA: Diagnosis not present

## 2020-05-05 DIAGNOSIS — E43 Unspecified severe protein-calorie malnutrition: Secondary | ICD-10-CM | POA: Diagnosis not present

## 2020-05-05 DIAGNOSIS — L89324 Pressure ulcer of left buttock, stage 4: Secondary | ICD-10-CM | POA: Diagnosis not present

## 2020-05-05 NOTE — Telephone Encounter (Signed)
Received call from primary care asking about ID follow up, antibiotic plan.  ID only consulted for drive-by, did not see patient, no ID follow up planned.  RN gave primary care advanced home infusion pharmacy phone number for questions. Landis Gandy, RN

## 2020-05-10 DIAGNOSIS — L89159 Pressure ulcer of sacral region, unspecified stage: Secondary | ICD-10-CM | POA: Diagnosis not present

## 2020-05-10 DIAGNOSIS — W19XXXD Unspecified fall, subsequent encounter: Secondary | ICD-10-CM | POA: Diagnosis not present

## 2020-05-10 DIAGNOSIS — J449 Chronic obstructive pulmonary disease, unspecified: Secondary | ICD-10-CM | POA: Diagnosis not present

## 2020-05-10 DIAGNOSIS — R627 Adult failure to thrive: Secondary | ICD-10-CM | POA: Diagnosis not present

## 2020-05-10 DIAGNOSIS — I8290 Acute embolism and thrombosis of unspecified vein: Secondary | ICD-10-CM | POA: Diagnosis not present

## 2020-05-10 DIAGNOSIS — E860 Dehydration: Secondary | ICD-10-CM | POA: Diagnosis not present

## 2020-05-10 DIAGNOSIS — D693 Immune thrombocytopenic purpura: Secondary | ICD-10-CM | POA: Diagnosis not present

## 2020-05-10 DIAGNOSIS — E43 Unspecified severe protein-calorie malnutrition: Secondary | ICD-10-CM | POA: Diagnosis not present

## 2020-05-12 DIAGNOSIS — E43 Unspecified severe protein-calorie malnutrition: Secondary | ICD-10-CM | POA: Diagnosis not present

## 2020-05-12 DIAGNOSIS — M6281 Muscle weakness (generalized): Secondary | ICD-10-CM | POA: Diagnosis not present

## 2020-05-12 DIAGNOSIS — L89616 Pressure-induced deep tissue damage of right heel: Secondary | ICD-10-CM | POA: Diagnosis not present

## 2020-05-12 DIAGNOSIS — L89324 Pressure ulcer of left buttock, stage 4: Secondary | ICD-10-CM | POA: Diagnosis not present

## 2020-05-18 ENCOUNTER — Other Ambulatory Visit: Payer: Self-pay | Admitting: Family Medicine

## 2020-05-19 DIAGNOSIS — E43 Unspecified severe protein-calorie malnutrition: Secondary | ICD-10-CM | POA: Diagnosis not present

## 2020-05-19 DIAGNOSIS — L89616 Pressure-induced deep tissue damage of right heel: Secondary | ICD-10-CM | POA: Diagnosis not present

## 2020-05-19 DIAGNOSIS — M6281 Muscle weakness (generalized): Secondary | ICD-10-CM | POA: Diagnosis not present

## 2020-05-19 DIAGNOSIS — L89324 Pressure ulcer of left buttock, stage 4: Secondary | ICD-10-CM | POA: Diagnosis not present

## 2020-05-26 DIAGNOSIS — M6281 Muscle weakness (generalized): Secondary | ICD-10-CM | POA: Diagnosis not present

## 2020-05-26 DIAGNOSIS — L89324 Pressure ulcer of left buttock, stage 4: Secondary | ICD-10-CM | POA: Diagnosis not present

## 2020-05-26 DIAGNOSIS — L89616 Pressure-induced deep tissue damage of right heel: Secondary | ICD-10-CM | POA: Diagnosis not present

## 2020-05-26 DIAGNOSIS — E43 Unspecified severe protein-calorie malnutrition: Secondary | ICD-10-CM | POA: Diagnosis not present

## 2020-05-27 DIAGNOSIS — I8290 Acute embolism and thrombosis of unspecified vein: Secondary | ICD-10-CM | POA: Diagnosis not present

## 2020-05-27 DIAGNOSIS — E43 Unspecified severe protein-calorie malnutrition: Secondary | ICD-10-CM | POA: Diagnosis not present

## 2020-05-27 DIAGNOSIS — J449 Chronic obstructive pulmonary disease, unspecified: Secondary | ICD-10-CM | POA: Diagnosis not present

## 2020-05-27 DIAGNOSIS — D649 Anemia, unspecified: Secondary | ICD-10-CM | POA: Diagnosis not present

## 2020-05-27 DIAGNOSIS — W19XXXD Unspecified fall, subsequent encounter: Secondary | ICD-10-CM | POA: Diagnosis not present

## 2020-05-27 DIAGNOSIS — K5900 Constipation, unspecified: Secondary | ICD-10-CM | POA: Diagnosis not present

## 2020-05-27 DIAGNOSIS — R627 Adult failure to thrive: Secondary | ICD-10-CM | POA: Diagnosis not present

## 2020-05-27 DIAGNOSIS — D693 Immune thrombocytopenic purpura: Secondary | ICD-10-CM | POA: Diagnosis not present

## 2020-05-28 ENCOUNTER — Ambulatory Visit: Payer: Medicare Other | Admitting: Pulmonary Disease

## 2020-05-31 MED FILL — PEGASYS 180 MCG/ML VIAL: 180 | 28 days supply | Qty: 4 | Fill #6

## 2020-05-31 MED FILL — BD TB SYRINGE 27GX1/2: 27G X 1/2" | 28 days supply | Qty: 4 | Fill #7

## 2020-06-02 DIAGNOSIS — M6281 Muscle weakness (generalized): Secondary | ICD-10-CM | POA: Diagnosis not present

## 2020-06-02 DIAGNOSIS — L89613 Pressure ulcer of right heel, stage 3: Secondary | ICD-10-CM | POA: Diagnosis not present

## 2020-06-02 DIAGNOSIS — L89324 Pressure ulcer of left buttock, stage 4: Secondary | ICD-10-CM | POA: Diagnosis not present

## 2020-06-02 DIAGNOSIS — E43 Unspecified severe protein-calorie malnutrition: Secondary | ICD-10-CM | POA: Diagnosis not present

## 2020-06-07 ENCOUNTER — Other Ambulatory Visit: Payer: Self-pay | Admitting: Hematology & Oncology

## 2020-06-07 DIAGNOSIS — D473 Essential (hemorrhagic) thrombocythemia: Secondary | ICD-10-CM

## 2020-06-09 DIAGNOSIS — E43 Unspecified severe protein-calorie malnutrition: Secondary | ICD-10-CM | POA: Diagnosis not present

## 2020-06-09 DIAGNOSIS — L89324 Pressure ulcer of left buttock, stage 4: Secondary | ICD-10-CM | POA: Diagnosis not present

## 2020-06-09 DIAGNOSIS — L89613 Pressure ulcer of right heel, stage 3: Secondary | ICD-10-CM | POA: Diagnosis not present

## 2020-06-09 DIAGNOSIS — M6281 Muscle weakness (generalized): Secondary | ICD-10-CM | POA: Diagnosis not present

## 2020-06-14 ENCOUNTER — Other Ambulatory Visit: Payer: Self-pay | Admitting: Hematology & Oncology

## 2020-06-14 ENCOUNTER — Other Ambulatory Visit: Payer: Self-pay | Admitting: Family Medicine

## 2020-06-14 DIAGNOSIS — K5900 Constipation, unspecified: Secondary | ICD-10-CM | POA: Diagnosis not present

## 2020-06-14 DIAGNOSIS — D649 Anemia, unspecified: Secondary | ICD-10-CM | POA: Diagnosis not present

## 2020-06-14 DIAGNOSIS — E860 Dehydration: Secondary | ICD-10-CM | POA: Diagnosis not present

## 2020-06-14 DIAGNOSIS — W19XXXD Unspecified fall, subsequent encounter: Secondary | ICD-10-CM | POA: Diagnosis not present

## 2020-06-14 DIAGNOSIS — J449 Chronic obstructive pulmonary disease, unspecified: Secondary | ICD-10-CM | POA: Diagnosis not present

## 2020-06-14 DIAGNOSIS — R627 Adult failure to thrive: Secondary | ICD-10-CM | POA: Diagnosis not present

## 2020-06-14 DIAGNOSIS — E43 Unspecified severe protein-calorie malnutrition: Secondary | ICD-10-CM | POA: Diagnosis not present

## 2020-06-15 ENCOUNTER — Telehealth: Payer: Self-pay | Admitting: Family Medicine

## 2020-06-15 ENCOUNTER — Telehealth: Payer: Self-pay

## 2020-06-15 NOTE — Telephone Encounter (Signed)
OK to give VO requested for nursing and PT will need an appt within 30 days, virtual or in person as patient is able

## 2020-06-15 NOTE — Telephone Encounter (Signed)
Called pt and spoke with pt and nurse who informed pt will be covered with meds until f/u appointment face to face is arranged.

## 2020-06-15 NOTE — Telephone Encounter (Signed)
Caller: Santiago Glad Call Back #  Called requesting verbal order for PT and skilled nursing for wound care . Patient recently released from [place] rehab.

## 2020-06-15 NOTE — Telephone Encounter (Signed)
Patient called stating she is being discharged from [facility] and is needing a refill on Percocet.  Pharmacy is [facility] in [place].    She stated she has a sore on her bottom and pne on her R heel and will have home health care coming to her house to see her for this.    She requested a f/up appt with Dr. Charlett Blake and I did advise Dr. Charlett Blake is booked up and advised she see Percell Miller and made pt and appt for Dec.  Pt did not want to come in any earlier than this.

## 2020-06-15 NOTE — Telephone Encounter (Signed)
Caller : [facility] home health Call Back # [phone]

## 2020-06-16 DIAGNOSIS — L89324 Pressure ulcer of left buttock, stage 4: Secondary | ICD-10-CM | POA: Diagnosis not present

## 2020-06-16 DIAGNOSIS — E43 Unspecified severe protein-calorie malnutrition: Secondary | ICD-10-CM | POA: Diagnosis not present

## 2020-06-16 DIAGNOSIS — L89613 Pressure ulcer of right heel, stage 3: Secondary | ICD-10-CM | POA: Diagnosis not present

## 2020-06-16 DIAGNOSIS — M6281 Muscle weakness (generalized): Secondary | ICD-10-CM | POA: Diagnosis not present

## 2020-06-16 NOTE — Telephone Encounter (Signed)
Spoke with Rachael Johnson pt d/c this week

## 2020-06-18 ENCOUNTER — Telehealth: Payer: Self-pay

## 2020-06-18 NOTE — Telephone Encounter (Signed)
Called and left a vm with f.u appt per 11.19 inbasket as no los was done at 03/18/20 visit... AOM

## 2020-06-21 ENCOUNTER — Other Ambulatory Visit: Payer: Self-pay

## 2020-06-21 ENCOUNTER — Encounter: Payer: Self-pay | Admitting: Medical

## 2020-06-21 ENCOUNTER — Telehealth: Payer: Self-pay | Admitting: Medical

## 2020-06-21 ENCOUNTER — Other Ambulatory Visit: Payer: Self-pay | Admitting: Family Medicine

## 2020-06-21 ENCOUNTER — Ambulatory Visit (INDEPENDENT_AMBULATORY_CARE_PROVIDER_SITE_OTHER): Payer: Medicare Other | Admitting: Medical

## 2020-06-21 VITALS — BP 100/70 | HR 100 | Resp 18 | Ht 64.0 in | Wt 119.0 lb

## 2020-06-21 DIAGNOSIS — G8929 Other chronic pain: Secondary | ICD-10-CM

## 2020-06-21 DIAGNOSIS — L97519 Non-pressure chronic ulcer of other part of right foot with unspecified severity: Secondary | ICD-10-CM

## 2020-06-21 DIAGNOSIS — R29898 Other symptoms and signs involving the musculoskeletal system: Secondary | ICD-10-CM

## 2020-06-21 DIAGNOSIS — D500 Iron deficiency anemia secondary to blood loss (chronic): Secondary | ICD-10-CM | POA: Diagnosis not present

## 2020-06-21 DIAGNOSIS — M549 Dorsalgia, unspecified: Secondary | ICD-10-CM

## 2020-06-21 DIAGNOSIS — I10 Essential (primary) hypertension: Secondary | ICD-10-CM | POA: Diagnosis not present

## 2020-06-21 DIAGNOSIS — Z8739 Personal history of other diseases of the musculoskeletal system and connective tissue: Secondary | ICD-10-CM

## 2020-06-21 DIAGNOSIS — R739 Hyperglycemia, unspecified: Secondary | ICD-10-CM | POA: Diagnosis not present

## 2020-06-21 MED ORDER — OXYCODONE-ACETAMINOPHEN 10-325 MG PO TABS: 1.0000 | ORAL_TABLET | Freq: Three times a day (TID) | ORAL | 0 refills | Status: DC | PRN

## 2020-06-21 MED ORDER — OXYCODONE-ACETAMINOPHEN 10-325 MG PO TABS
1.0000 | ORAL_TABLET | Freq: Three times a day (TID) | ORAL | 0 refills | Status: DC | PRN
Start: 2020-06-21 — End: 2020-06-21

## 2020-06-21 NOTE — Patient Instructions (Addendum)
History of osteo myelitis following sacral ulcer.  Continue with wound care.  Glad to hear that they are coming out this week.  You had new right heel ulcer and I did get culture of that wound.  We will follow the culture and please make sure wound care is aware that ulcer as well.  Depending on culture result and response to wound care might add antibiotic.  Also follow your CBC.  Lower extremity weakness since reported stroke this summer.  I think this matches more deconditioning as you report good strength and motor function but poor endurance.  Continue with physical therapy.  Elevated sugar in the past and will follow A1c.  Anemia on last hospitalization.  Will repeat CBC and iron level.  Low potassium on last hospitalization so we will follow metabolic panel/K level.  History of chronic pain.  It does appear that you are on contract and have updated UDS.  I gave you 5-day supply of oxycodone.  Will defer large-volume refill to your PCP.  Follow-up 7 to 10 days or as needed.

## 2020-06-21 NOTE — Telephone Encounter (Signed)
Dr Charlett Blake,  I saw patient today for the first time.  She had hospitalization recently and  she stated needed refills of oxycodone.  She has recent decubitus ulcer and right heel ulcer.  This is in addition to her chronic back pain.  She requested high-volume oxycodone.  I gave her 15 tablets and explained that I would ask you for large volume refill of her oxycodone.  Also saw that she was on a benzodiazepine.  Counseled her not to use both at the same time.  After she left today saw  she was up today contract and UDS.  She is clinic, tomorrow around 2 pm to get labs.  Would you be willing to prescribe her remaining tablets?  Thanks,  Percell Miller

## 2020-06-21 NOTE — Progress Notes (Signed)
Subjective:    Patient ID: Rachael Johnson, female    DOB: [birth year], 69 y.o.   MRN: [id number]  HPI  Pt in for follow up. Pt has hx of stroke in summer and sacral ulcer with osteomyelitis in September.  Pt only deficit is from rt hand. Pt has weak grip strength. Pt has difficulty extending her rt 4th and 5th digit. Did improve post stroke with PT per pt. Described having lower ext weakness/ deconditioned.  Pt was evaluated at [facility] in [place] in summer.   Pt was admitted to [facility] later September for the below Discharge Diagnoses:  Principal Problem:   Pressure injury of sacral region, stage 4 ([place]) Active Problems:   Thrombocythemia ([place])   Hypothyroid   Goals of care, counseling/discussion   COPD (chronic obstructive pulmonary disease) ([place])   Osteomyelitis (HCC)   Decubitus ulcer of left heel, unstageable ([place])   Acute encephalopathy   Palliative care by specialist   Severe protein-calorie malnutrition ([place])   Adult failure to thrive   Weakness   Discharge Condition: Stable and with plan to go to a skilled nursing facility for further care and rehabilitation   Hospital Course:  1. Infected sacral decubitus ulcer. Seen by general surgery and debridement done at bedside. She is on broad-spectrum antibiotics. Imaging indicates underlying osteomyelitis. Discussed with Dr West Bali with infectious disease with recommendations for 6 weeks of IV vancomycin and ertapenem.   Constant repositioning to offload pressure and topical care as per wound care instructions (see below for details) recommended.  2. Sepsis. Likely related to #1. Sepsis features improving/resolved for discharge.  She met sepsis criteria on admission. Continue antibiotics as mentioned above. Blood cultures have shown no growth to date.  3. Recently diagnosed superficial thrombus. Followed by oncology and started on Xarelto. Plan is for 2 months of anticoagulation.  Continue  outpatient follow-up with oncology service and repeat CBC intermittently to follow hemoglobin trend.  No signs of overt bleeding.  4. Essential thrombocythemia. She is chronically on interferon through oncology. Platelet counts do not appear to be substantially elevated.  Continue outpatient follow-up with oncology service.  5. Failure to thrive with severe protein calorie malnutrition. Albumin of 2.0, I have encouraged patient to increase oral intake and to follow instruction by nutritionist for feeding supplements.  She will need high-protein intake to allow wound to heal.   6. Hypokalemia.  -Repleted and patient discharged on daily supplementation -Repeat basic metabolic panel to follow electrolytes stability.  7.Acute encephalopathy. Metabolic work-up is unrevealing. She does not appear to have any focal deficits. After discussing with patient's son, appears to be a more chronic process. Likely has some degree of mild cognitive impairment at baseline and symptoms got substantially worse after recent prolonged admission for pneumonia and now sepsis due to osteomyelitis.Overall, mental status has improved since admission and patient is able to follow commands appropriately.  Continue to follow constant reorientation and minimize sedative agents.  8.COPD. No complaints of shortness at this time.  Resume home inhaler management.  9. Constipation.  In the setting of chronic analgesic regimen.  Started on [place] and [place].  Advised to maintain adequate hydration.  10. Urinary retention. Likely related to constipation and chronic use of opiates.   11. Goals of care. Long discussion with patient's son [name]. Patient has had a significant decline over the past 2 months. It appears that she did have some mild cognitive impairment prior to this, but was functional and active. Since  her admission approximately 2 months ago for pneumonia which was prolonged, she  has had a consistent decline. Her p.o. intake has been poor and she has been losing weight. She is developed a significant sacral ulcer that will be very difficult to heal considering her poor nutritional status. Palliative care consulted with plans for family meeting on 9/21. Long-term prognosis is poor.  12. Anemia, likely related to nutritional deficiencies/chronic disease. No obvious bleeding. Ferritin/iron did not indicate iron deficiency. Anemia is macrocytic. Folic acid is low and was repleted. B12 level is not low. Transfused1 unit of PRBC (hospitalization. Continue to follow hemoglobin trend.    Pt just discharge from [facility] home.  Sacral ulcer looks dryer compared to prior. Daughter showed me pictures.  Now has heal ulcer rt side.   Pt asks for percocet. She states has chronic back pain even before she got heal ulcer and decubitus ulcer.      Review of Systems     Objective:   Physical Exam  General Mental Status- Alert. General Appearance- Not in acute distress.   Skin General: Color- Normal Color. Moisture- Normal Moisture.  Neck Carotid Arteries- Normal color. Moisture- Normal Moisture. No carotid bruits. No JVD.  Chest and Lung Exam Auscultation: Breath Sounds:-Normal.  Cardiovascular Auscultation:Rythm- Regular. Murmurs & Other Heart Sounds:Auscultation of the heart reveals- No Murmurs.  Abdomen Inspection:-Inspeection Normal. Palpation/Percussion:Note:No mass. Palpation and Percussion of the abdomen reveal- Non Tender, Non Distended + BS, no rebound or guarding.    Neurologic Cranial nerves III through XII grossly intact. Patient in wheelchair.  Has good motor and sensory function except she has decreased grip strength and mild difficulty fully extending fourth and fifth digit right hand.  Right lower extremity-she has shallow break in the skin/ulcer.  Dry yellow discharge.  Looks like a scab in the center portion.       Assessment & Plan:  History of osteo myelitis following sacral ulcer.  Continue with wound care.  Glad to hear that they are coming out this week.  You had new right heel ulcer and I did get culture of that wound.  We will follow the culture and please make sure wound care is aware that ulcer as well.  Depending on culture result and response to wound care might add antibiotic.  Also follow your CBC.  Lower extremity weakness since reported stroke this summer.  I think this matches more deconditioning as you report good strength and motor function but poor endurance.  Continue with physical therapy.  Elevated sugar in the past and will follow A1c.  Anemia on last hospitalization.  Will repeat CBC and iron level.  Low potassium on last hospitalization so we will follow metabolic panel/K level.  History of chronic pain.  It does appear that you are on contract and have updated UDS.  I gave you 5-day supply of oxycodone.  Will defer large-volume refill to your PCP.  Follow-up 7 to 10 days or as needed.  Time spent with  patient today was 55  minutes which consisted of chart review, discussing diagnoses, work up, treatment, answering questions  and documentation.

## 2020-06-21 NOTE — Telephone Encounter (Signed)
Thanks, she is complicated. I did go ahead and send in more of the oxycdone for the patient.

## 2020-06-22 ENCOUNTER — Other Ambulatory Visit: Payer: Self-pay | Admitting: Family Medicine

## 2020-06-22 ENCOUNTER — Other Ambulatory Visit (INDEPENDENT_AMBULATORY_CARE_PROVIDER_SITE_OTHER): Payer: Medicare Other

## 2020-06-22 DIAGNOSIS — R739 Hyperglycemia, unspecified: Secondary | ICD-10-CM | POA: Diagnosis not present

## 2020-06-22 DIAGNOSIS — D500 Iron deficiency anemia secondary to blood loss (chronic): Secondary | ICD-10-CM | POA: Diagnosis not present

## 2020-06-22 DIAGNOSIS — L97519 Non-pressure chronic ulcer of other part of right foot with unspecified severity: Secondary | ICD-10-CM | POA: Diagnosis not present

## 2020-06-22 DIAGNOSIS — I10 Essential (primary) hypertension: Secondary | ICD-10-CM | POA: Diagnosis not present

## 2020-06-22 DIAGNOSIS — G8929 Other chronic pain: Secondary | ICD-10-CM

## 2020-06-22 NOTE — Telephone Encounter (Signed)
Left detailed message on machine that rx was sent in on yesterday.

## 2020-06-22 NOTE — Telephone Encounter (Signed)
Dr. Charlett Blake you sent this in on yesterday can you refuse this one to get it out of the rx request.

## 2020-06-22 NOTE — Telephone Encounter (Signed)
Patient states she would like to rest on her medication called into pharmacy.   Please Advise

## 2020-06-23 ENCOUNTER — Telehealth: Payer: Self-pay

## 2020-06-23 DIAGNOSIS — K219 Gastro-esophageal reflux disease without esophagitis: Secondary | ICD-10-CM | POA: Diagnosis not present

## 2020-06-23 DIAGNOSIS — L89614 Pressure ulcer of right heel, stage 4: Secondary | ICD-10-CM | POA: Diagnosis not present

## 2020-06-23 DIAGNOSIS — F419 Anxiety disorder, unspecified: Secondary | ICD-10-CM | POA: Diagnosis not present

## 2020-06-23 DIAGNOSIS — D693 Immune thrombocytopenic purpura: Secondary | ICD-10-CM | POA: Diagnosis not present

## 2020-06-23 DIAGNOSIS — E43 Unspecified severe protein-calorie malnutrition: Secondary | ICD-10-CM | POA: Diagnosis not present

## 2020-06-23 DIAGNOSIS — Z9181 History of falling: Secondary | ICD-10-CM | POA: Diagnosis not present

## 2020-06-23 DIAGNOSIS — G2581 Restless legs syndrome: Secondary | ICD-10-CM | POA: Diagnosis not present

## 2020-06-23 DIAGNOSIS — L89324 Pressure ulcer of left buttock, stage 4: Secondary | ICD-10-CM | POA: Diagnosis not present

## 2020-06-23 DIAGNOSIS — I69354 Hemiplegia and hemiparesis following cerebral infarction affecting left non-dominant side: Secondary | ICD-10-CM | POA: Diagnosis not present

## 2020-06-23 DIAGNOSIS — R627 Adult failure to thrive: Secondary | ICD-10-CM | POA: Diagnosis not present

## 2020-06-23 DIAGNOSIS — D649 Anemia, unspecified: Secondary | ICD-10-CM | POA: Diagnosis not present

## 2020-06-23 DIAGNOSIS — F329 Major depressive disorder, single episode, unspecified: Secondary | ICD-10-CM | POA: Diagnosis not present

## 2020-06-23 DIAGNOSIS — M159 Polyosteoarthritis, unspecified: Secondary | ICD-10-CM | POA: Diagnosis not present

## 2020-06-23 DIAGNOSIS — J449 Chronic obstructive pulmonary disease, unspecified: Secondary | ICD-10-CM | POA: Diagnosis not present

## 2020-06-23 LAB — CBC WITH DIFFERENTIAL/PLATELET
Basophils Absolute: 0 10*3/uL (ref 0.0–0.1)
Basophils Relative: 0.7 % (ref 0.0–3.0)
Eosinophils Absolute: 0.4 10*3/uL (ref 0.0–0.7)
Eosinophils Relative: 7 % — ABNORMAL HIGH (ref 0.0–5.0)
HCT: 31 % — ABNORMAL LOW (ref 36.0–46.0)
Hemoglobin: 9.7 g/dL — ABNORMAL LOW (ref 12.0–15.0)
Lymphocytes Relative: 16.5 % (ref 12.0–46.0)
Lymphs Abs: 1 10*3/uL (ref 0.7–4.0)
MCHC: 31.4 g/dL (ref 30.0–36.0)
MCV: 99.3 fl (ref 78.0–100.0)
Monocytes Absolute: 0.4 10*3/uL (ref 0.1–1.0)
Monocytes Relative: 5.6 % (ref 3.0–12.0)
Neutro Abs: 4.4 10*3/uL (ref 1.4–7.7)
Neutrophils Relative %: 70.2 % (ref 43.0–77.0)
Platelets: 641 10*3/uL — ABNORMAL HIGH (ref 150.0–400.0)
RBC: 3.12 Mil/uL — ABNORMAL LOW (ref 3.87–5.11)
RDW: 17.7 % — ABNORMAL HIGH (ref 11.5–15.5)
WBC: 6.3 10*3/uL (ref 4.0–10.5)

## 2020-06-23 LAB — COMPREHENSIVE METABOLIC PANEL
ALT: 19 U/L (ref 0–35)
AST: 45 U/L — ABNORMAL HIGH (ref 0–37)
Albumin: 2.7 g/dL — ABNORMAL LOW (ref 3.5–5.2)
Alkaline Phosphatase: 276 U/L — ABNORMAL HIGH (ref 39–117)
BUN: 18 mg/dL (ref 6–23)
CO2: 29 mEq/L (ref 19–32)
Calcium: 7.7 mg/dL — ABNORMAL LOW (ref 8.4–10.5)
Chloride: 101 mEq/L (ref 96–112)
Creatinine, Ser: 1.27 mg/dL — ABNORMAL HIGH (ref 0.40–1.20)
GFR: 43.49 mL/min — ABNORMAL LOW (ref >60.00)
Glucose, Bld: 81 mg/dL (ref 70–99)
Potassium: 3.5 mEq/L (ref 3.5–5.1)
Sodium: 139 mEq/L (ref 135–145)
Total Bilirubin: 0.4 mg/dL (ref 0.2–1.2)
Total Protein: 6.3 g/dL (ref 6.0–8.3)

## 2020-06-23 LAB — IRON: Iron: 65 ug/dL (ref 42–145)

## 2020-06-23 LAB — HEMOGLOBIN A1C: Hgb A1c MFr Bld: 5.5 % (ref 4.6–6.5)

## 2020-06-23 NOTE — Telephone Encounter (Signed)
Called pt with new appt time per Dr PE as he out of the offiice on 12/29, r/s to 12/28 .Marland KitchenMarland KitchenMarland KitchenAOM

## 2020-06-24 ENCOUNTER — Telehealth: Payer: Self-pay | Admitting: Medical

## 2020-06-24 MED ORDER — SULFAMETHOXAZOLE-TRIMETHOPRIM 800-160 MG PO TABS: 1.0000 | ORAL_TABLET | Freq: Two times a day (BID) | ORAL | 0 refills | Status: DC

## 2020-06-24 NOTE — Telephone Encounter (Signed)
Bactrim ds antibiotic sent to pharmacy.

## 2020-06-25 ENCOUNTER — Telehealth: Payer: Self-pay | Admitting: Medical

## 2020-06-25 LAB — WOUND CULTURE
MICRO NUMBER:: 11238562
SPECIMEN QUALITY:: ADEQUATE

## 2020-06-25 NOTE — Telephone Encounter (Signed)
Opened to review 

## 2020-06-28 ENCOUNTER — Telehealth: Payer: Self-pay | Admitting: Family Medicine

## 2020-06-28 DIAGNOSIS — R627 Adult failure to thrive: Secondary | ICD-10-CM | POA: Diagnosis not present

## 2020-06-28 DIAGNOSIS — L89324 Pressure ulcer of left buttock, stage 4: Secondary | ICD-10-CM | POA: Diagnosis not present

## 2020-06-28 DIAGNOSIS — D649 Anemia, unspecified: Secondary | ICD-10-CM | POA: Diagnosis not present

## 2020-06-28 DIAGNOSIS — I69354 Hemiplegia and hemiparesis following cerebral infarction affecting left non-dominant side: Secondary | ICD-10-CM | POA: Diagnosis not present

## 2020-06-28 DIAGNOSIS — L89614 Pressure ulcer of right heel, stage 4: Secondary | ICD-10-CM | POA: Diagnosis not present

## 2020-06-28 DIAGNOSIS — J449 Chronic obstructive pulmonary disease, unspecified: Secondary | ICD-10-CM | POA: Diagnosis not present

## 2020-06-28 NOTE — Telephone Encounter (Signed)
Per [place], patient has wounds on her legs and was wondering if we could prescribed a cream to help them.  St[facility] w/[facility] call back # [phone]

## 2020-06-28 NOTE — Telephone Encounter (Signed)
Yes she can have a cream but need to know more about what kind of sores they think she has pressure ulcers or boils or bug bites etc. Then I can pick the treatment.

## 2020-06-29 ENCOUNTER — Other Ambulatory Visit: Payer: Self-pay

## 2020-06-29 DIAGNOSIS — L89154 Pressure ulcer of sacral region, stage 4: Secondary | ICD-10-CM

## 2020-06-29 MED ORDER — COLLAGENASE 250 UNIT/GM EX OINT: TOPICAL_OINTMENT | Freq: Two times a day (BID) | CUTANEOUS | Status: AC

## 2020-06-29 MED ORDER — SULFAMETHOXAZOLE-TRIMETHOPRIM 800-160 MG PO TABS: 1.0000 | ORAL_TABLET | Freq: Two times a day (BID) | ORAL | 0 refills | Status: DC

## 2020-06-29 NOTE — Telephone Encounter (Signed)
Spoke with patient's daughter -- she is aware of lab results now and stated her mom's heel is improving they are concerned about her wound on her butt   Daughter call back [phone]

## 2020-06-29 NOTE — Telephone Encounter (Signed)
OK to give order for Santyl to apply to sacrum daily and as needed if soiled til wound heals. I believe a prescription needs to be sent to pharmacy and then let home health or family know to go pick up

## 2020-06-29 NOTE — Telephone Encounter (Signed)
I spoke with the home health nurse in regards to Rachael Johnson and her wound,  the nurse stated the wound on her heel is being taken care of but states she thought santyl was being called in for the wound on her butt area. The nurse states she was aware of the bactrim being at the pharmacy because she called looking for the cream. She states she will let the family know to call our office to review labs and pick up bactrim.

## 2020-06-29 NOTE — Telephone Encounter (Signed)
Open in error

## 2020-06-29 NOTE — Telephone Encounter (Signed)
Medication sent to pharmacy for pick up

## 2020-06-29 NOTE — Telephone Encounter (Signed)
Daughter notified 

## 2020-07-01 DIAGNOSIS — I69354 Hemiplegia and hemiparesis following cerebral infarction affecting left non-dominant side: Secondary | ICD-10-CM | POA: Diagnosis not present

## 2020-07-01 DIAGNOSIS — D649 Anemia, unspecified: Secondary | ICD-10-CM | POA: Diagnosis not present

## 2020-07-01 DIAGNOSIS — L89324 Pressure ulcer of left buttock, stage 4: Secondary | ICD-10-CM | POA: Diagnosis not present

## 2020-07-01 DIAGNOSIS — L89614 Pressure ulcer of right heel, stage 4: Secondary | ICD-10-CM | POA: Diagnosis not present

## 2020-07-01 DIAGNOSIS — R627 Adult failure to thrive: Secondary | ICD-10-CM | POA: Diagnosis not present

## 2020-07-01 DIAGNOSIS — J449 Chronic obstructive pulmonary disease, unspecified: Secondary | ICD-10-CM | POA: Diagnosis not present

## 2020-07-02 DIAGNOSIS — L89324 Pressure ulcer of left buttock, stage 4: Secondary | ICD-10-CM | POA: Diagnosis not present

## 2020-07-02 DIAGNOSIS — R627 Adult failure to thrive: Secondary | ICD-10-CM | POA: Diagnosis not present

## 2020-07-02 DIAGNOSIS — I69354 Hemiplegia and hemiparesis following cerebral infarction affecting left non-dominant side: Secondary | ICD-10-CM | POA: Diagnosis not present

## 2020-07-02 DIAGNOSIS — D649 Anemia, unspecified: Secondary | ICD-10-CM | POA: Diagnosis not present

## 2020-07-02 DIAGNOSIS — L89614 Pressure ulcer of right heel, stage 4: Secondary | ICD-10-CM | POA: Diagnosis not present

## 2020-07-02 DIAGNOSIS — J449 Chronic obstructive pulmonary disease, unspecified: Secondary | ICD-10-CM | POA: Diagnosis not present

## 2020-07-06 ENCOUNTER — Telehealth: Payer: Self-pay

## 2020-07-06 ENCOUNTER — Telehealth: Payer: Self-pay | Admitting: Family Medicine

## 2020-07-06 DIAGNOSIS — L89614 Pressure ulcer of right heel, stage 4: Secondary | ICD-10-CM | POA: Diagnosis not present

## 2020-07-06 DIAGNOSIS — L89324 Pressure ulcer of left buttock, stage 4: Secondary | ICD-10-CM | POA: Diagnosis not present

## 2020-07-06 DIAGNOSIS — I69354 Hemiplegia and hemiparesis following cerebral infarction affecting left non-dominant side: Secondary | ICD-10-CM | POA: Diagnosis not present

## 2020-07-06 DIAGNOSIS — J449 Chronic obstructive pulmonary disease, unspecified: Secondary | ICD-10-CM | POA: Diagnosis not present

## 2020-07-06 DIAGNOSIS — R627 Adult failure to thrive: Secondary | ICD-10-CM | POA: Diagnosis not present

## 2020-07-06 DIAGNOSIS — D649 Anemia, unspecified: Secondary | ICD-10-CM | POA: Diagnosis not present

## 2020-07-06 NOTE — Telephone Encounter (Signed)
Caller: [place]  Call Back #  [phone]   Rachael Johnson is concerned that family has not pick up patient medication in order to dress her wound. Rachael Johnson is just letting us know .

## 2020-07-06 NOTE — Telephone Encounter (Signed)
Derrick with [facility] was at pt's house and is reporting an abnormal pulse rate of 134 beats per minute at rest.  Otherwise, he reports patient's BP is fine, pt feels ok, O2 stats are 88-92,93 range. CB # [id number].  The nurse will be by pt's house later this afternoon and he will have her check pt out again.

## 2020-07-07 NOTE — Telephone Encounter (Signed)
Make sure she is hydrating and see if they can get another set of vitals tomorrow and even a set of labs would like to have a cmp, cbc with diff and tsh and UA with culture if they can

## 2020-07-08 ENCOUNTER — Ambulatory Visit: Payer: Medicare Other | Admitting: Medical

## 2020-07-09 DIAGNOSIS — L89614 Pressure ulcer of right heel, stage 4: Secondary | ICD-10-CM | POA: Diagnosis not present

## 2020-07-09 DIAGNOSIS — R627 Adult failure to thrive: Secondary | ICD-10-CM | POA: Diagnosis not present

## 2020-07-09 DIAGNOSIS — L89324 Pressure ulcer of left buttock, stage 4: Secondary | ICD-10-CM | POA: Diagnosis not present

## 2020-07-09 DIAGNOSIS — I69354 Hemiplegia and hemiparesis following cerebral infarction affecting left non-dominant side: Secondary | ICD-10-CM | POA: Diagnosis not present

## 2020-07-09 DIAGNOSIS — D649 Anemia, unspecified: Secondary | ICD-10-CM | POA: Diagnosis not present

## 2020-07-09 DIAGNOSIS — J449 Chronic obstructive pulmonary disease, unspecified: Secondary | ICD-10-CM | POA: Diagnosis not present

## 2020-07-09 NOTE — Telephone Encounter (Signed)
Left message on machine to see if Montine Circle got any updated vitals on patient, since message is old.

## 2020-07-15 ENCOUNTER — Telehealth: Payer: Self-pay

## 2020-07-15 DIAGNOSIS — R627 Adult failure to thrive: Secondary | ICD-10-CM | POA: Diagnosis not present

## 2020-07-15 DIAGNOSIS — D649 Anemia, unspecified: Secondary | ICD-10-CM | POA: Diagnosis not present

## 2020-07-15 DIAGNOSIS — L89614 Pressure ulcer of right heel, stage 4: Secondary | ICD-10-CM | POA: Diagnosis not present

## 2020-07-15 DIAGNOSIS — I69354 Hemiplegia and hemiparesis following cerebral infarction affecting left non-dominant side: Secondary | ICD-10-CM | POA: Diagnosis not present

## 2020-07-15 DIAGNOSIS — L89324 Pressure ulcer of left buttock, stage 4: Secondary | ICD-10-CM | POA: Diagnosis not present

## 2020-07-15 DIAGNOSIS — J449 Chronic obstructive pulmonary disease, unspecified: Secondary | ICD-10-CM | POA: Diagnosis not present

## 2020-07-15 NOTE — Telephone Encounter (Signed)
Tried calling again today, went straight to voicemail and voicemail box full. Will send a mychart message.

## 2020-07-15 NOTE — Telephone Encounter (Signed)
Received fax from [facility] regarding Pt's elevated pulse rate on 07/06/2020 (was 134)- called to check on Pt but no answer, and voicemail box is full.

## 2020-07-15 NOTE — Telephone Encounter (Signed)
Please see if home health will go out again next week and get a new set of vitals to see if pulse has improved.

## 2020-07-16 ENCOUNTER — Other Ambulatory Visit: Payer: Self-pay | Admitting: Family Medicine

## 2020-07-16 NOTE — Telephone Encounter (Signed)
Requesting: alprazolam 0.25mg  Contract: 04/15/2019 UDS: 10/09/2019 Last Visit: 06/21/20 Next Visit: 10/04/2020 Last Refill: 04/21/2020 #30 and 0RF   Please Advise

## 2020-07-20 ENCOUNTER — Telehealth: Payer: Self-pay | Admitting: Family Medicine

## 2020-07-20 DIAGNOSIS — R627 Adult failure to thrive: Secondary | ICD-10-CM | POA: Diagnosis not present

## 2020-07-20 DIAGNOSIS — L89324 Pressure ulcer of left buttock, stage 4: Secondary | ICD-10-CM | POA: Diagnosis not present

## 2020-07-20 DIAGNOSIS — I69354 Hemiplegia and hemiparesis following cerebral infarction affecting left non-dominant side: Secondary | ICD-10-CM | POA: Diagnosis not present

## 2020-07-20 DIAGNOSIS — L89614 Pressure ulcer of right heel, stage 4: Secondary | ICD-10-CM | POA: Diagnosis not present

## 2020-07-20 DIAGNOSIS — D649 Anemia, unspecified: Secondary | ICD-10-CM | POA: Diagnosis not present

## 2020-07-20 DIAGNOSIS — J449 Chronic obstructive pulmonary disease, unspecified: Secondary | ICD-10-CM | POA: Diagnosis not present

## 2020-07-20 NOTE — Telephone Encounter (Signed)
Called patient's son to let him know the medication was sent to into the pharmacy on 12/17 .

## 2020-07-20 NOTE — Telephone Encounter (Signed)
Patient son is calling in regards to patient medication. He wants to know if is possible for him to pick up moms medication on wednesday or Thursday few days before schedule.   oxyCODONE-acetaminophen (PERCOCET) 10-325 MG tablet [[id number]]   [facility] [address] [place] [address] [address] [place], [place] [address]  Phone:  [phone] Fax:  [phone]

## 2020-07-21 ENCOUNTER — Other Ambulatory Visit: Payer: Self-pay | Admitting: Family Medicine

## 2020-07-21 DIAGNOSIS — G8929 Other chronic pain: Secondary | ICD-10-CM

## 2020-07-21 MED ORDER — OXYCODONE-ACETAMINOPHEN 10-325 MG PO TABS: 1.0000 | ORAL_TABLET | Freq: Three times a day (TID) | ORAL | 0 refills | Status: DC | PRN

## 2020-07-21 NOTE — Telephone Encounter (Signed)
Requesting:  Oxycodone Last written:  06/21/20  Last ov: 06/21/20 Next ov: 10/05/19 Contract: n/a UDS: 10/09/19

## 2020-07-21 NOTE — Telephone Encounter (Signed)
oxyCODONE-acetaminophen (PERCOCET) 10-325 MG tablet [[id number]]    Patient's daughter states that medication was not sent in to pharmacy in [place].   Patient would like medication sent before christmas before store closes earlier

## 2020-07-23 DIAGNOSIS — L89614 Pressure ulcer of right heel, stage 4: Secondary | ICD-10-CM | POA: Diagnosis not present

## 2020-07-26 ENCOUNTER — Other Ambulatory Visit: Payer: Self-pay | Admitting: *Deleted

## 2020-07-26 DIAGNOSIS — D473 Essential (hemorrhagic) thrombocythemia: Secondary | ICD-10-CM

## 2020-07-26 DIAGNOSIS — D510 Vitamin B12 deficiency anemia due to intrinsic factor deficiency: Secondary | ICD-10-CM

## 2020-07-27 ENCOUNTER — Inpatient Hospital Stay: Payer: Medicare Other | Admitting: Hematology & Oncology

## 2020-07-27 ENCOUNTER — Inpatient Hospital Stay: Payer: Medicare Other

## 2020-07-28 ENCOUNTER — Other Ambulatory Visit: Payer: Medicare Other

## 2020-07-28 ENCOUNTER — Ambulatory Visit: Payer: Medicare Other | Admitting: Hematology & Oncology

## 2020-07-28 ENCOUNTER — Telehealth: Payer: Self-pay | Admitting: *Deleted

## 2020-07-28 DIAGNOSIS — R918 Other nonspecific abnormal finding of lung field: Secondary | ICD-10-CM | POA: Diagnosis not present

## 2020-07-28 DIAGNOSIS — R791 Abnormal coagulation profile: Secondary | ICD-10-CM | POA: Diagnosis not present

## 2020-07-28 DIAGNOSIS — Z87891 Personal history of nicotine dependence: Secondary | ICD-10-CM | POA: Diagnosis not present

## 2020-07-28 DIAGNOSIS — J9601 Acute respiratory failure with hypoxia: Secondary | ICD-10-CM | POA: Diagnosis not present

## 2020-07-28 DIAGNOSIS — D649 Anemia, unspecified: Secondary | ICD-10-CM | POA: Diagnosis not present

## 2020-07-28 DIAGNOSIS — R6521 Severe sepsis with septic shock: Secondary | ICD-10-CM | POA: Diagnosis not present

## 2020-07-28 DIAGNOSIS — E162 Hypoglycemia, unspecified: Secondary | ICD-10-CM | POA: Diagnosis not present

## 2020-07-28 DIAGNOSIS — R9431 Abnormal electrocardiogram [ECG] [EKG]: Secondary | ICD-10-CM | POA: Diagnosis not present

## 2020-07-28 DIAGNOSIS — Z20822 Contact with and (suspected) exposure to covid-19: Secondary | ICD-10-CM | POA: Diagnosis not present

## 2020-07-28 DIAGNOSIS — Z79899 Other long term (current) drug therapy: Secondary | ICD-10-CM | POA: Diagnosis not present

## 2020-07-28 DIAGNOSIS — E161 Other hypoglycemia: Secondary | ICD-10-CM | POA: Diagnosis not present

## 2020-07-28 DIAGNOSIS — J189 Pneumonia, unspecified organism: Secondary | ICD-10-CM | POA: Diagnosis not present

## 2020-07-28 DIAGNOSIS — R404 Transient alteration of awareness: Secondary | ICD-10-CM | POA: Diagnosis not present

## 2020-07-28 DIAGNOSIS — R4182 Altered mental status, unspecified: Secondary | ICD-10-CM | POA: Diagnosis not present

## 2020-07-28 DIAGNOSIS — R4020 Unspecified coma: Secondary | ICD-10-CM | POA: Diagnosis not present

## 2020-07-28 NOTE — Telephone Encounter (Signed)
Call received from patient's daughter, Bridgette Habermann stating that pt will not be in for her appts today d/t she is weak and not eating.  Hope states that she is going to have her brother take Ms Saintvil to the ER at [facility] today.  Dr. Myna Hidalgo notified.

## 2020-07-29 DIAGNOSIS — R946 Abnormal results of thyroid function studies: Secondary | ICD-10-CM | POA: Diagnosis not present

## 2020-07-29 DIAGNOSIS — J189 Pneumonia, unspecified organism: Secondary | ICD-10-CM | POA: Diagnosis not present

## 2020-07-29 DIAGNOSIS — D469 Myelodysplastic syndrome, unspecified: Secondary | ICD-10-CM | POA: Diagnosis not present

## 2020-07-29 DIAGNOSIS — R9431 Abnormal electrocardiogram [ECG] [EKG]: Secondary | ICD-10-CM | POA: Diagnosis not present

## 2020-07-29 DIAGNOSIS — G8929 Other chronic pain: Secondary | ICD-10-CM | POA: Diagnosis not present

## 2020-07-29 DIAGNOSIS — R6521 Severe sepsis with septic shock: Secondary | ICD-10-CM | POA: Diagnosis present

## 2020-07-29 DIAGNOSIS — G96198 Other disorders of meninges, not elsewhere classified: Secondary | ICD-10-CM | POA: Diagnosis not present

## 2020-07-29 DIAGNOSIS — A403 Sepsis due to Streptococcus pneumoniae: Secondary | ICD-10-CM | POA: Diagnosis not present

## 2020-07-29 DIAGNOSIS — J3801 Paralysis of vocal cords and larynx, unilateral: Secondary | ICD-10-CM | POA: Diagnosis not present

## 2020-07-29 DIAGNOSIS — G4089 Other seizures: Secondary | ICD-10-CM | POA: Diagnosis present

## 2020-07-29 DIAGNOSIS — J13 Pneumonia due to Streptococcus pneumoniae: Secondary | ICD-10-CM | POA: Diagnosis present

## 2020-07-29 DIAGNOSIS — F329 Major depressive disorder, single episode, unspecified: Secondary | ICD-10-CM | POA: Diagnosis present

## 2020-07-29 DIAGNOSIS — L89621 Pressure ulcer of left heel, stage 1: Secondary | ICD-10-CM | POA: Diagnosis not present

## 2020-07-29 DIAGNOSIS — D62 Acute posthemorrhagic anemia: Secondary | ICD-10-CM | POA: Diagnosis not present

## 2020-07-29 DIAGNOSIS — Z452 Encounter for adjustment and management of vascular access device: Secondary | ICD-10-CM | POA: Diagnosis not present

## 2020-07-29 DIAGNOSIS — U071 COVID-19: Secondary | ICD-10-CM | POA: Diagnosis present

## 2020-07-29 DIAGNOSIS — K5730 Diverticulosis of large intestine without perforation or abscess without bleeding: Secondary | ICD-10-CM | POA: Diagnosis not present

## 2020-07-29 DIAGNOSIS — E162 Hypoglycemia, unspecified: Secondary | ICD-10-CM | POA: Diagnosis not present

## 2020-07-29 DIAGNOSIS — J449 Chronic obstructive pulmonary disease, unspecified: Secondary | ICD-10-CM | POA: Diagnosis present

## 2020-07-29 DIAGNOSIS — Z7952 Long term (current) use of systemic steroids: Secondary | ICD-10-CM | POA: Diagnosis not present

## 2020-07-29 DIAGNOSIS — T83511A Infection and inflammatory reaction due to indwelling urethral catheter, initial encounter: Secondary | ICD-10-CM | POA: Diagnosis present

## 2020-07-29 DIAGNOSIS — J154 Pneumonia due to other streptococci: Secondary | ICD-10-CM | POA: Diagnosis not present

## 2020-07-29 DIAGNOSIS — R918 Other nonspecific abnormal finding of lung field: Secondary | ICD-10-CM | POA: Diagnosis not present

## 2020-07-29 DIAGNOSIS — I5020 Unspecified systolic (congestive) heart failure: Secondary | ICD-10-CM | POA: Diagnosis present

## 2020-07-29 DIAGNOSIS — R945 Abnormal results of liver function studies: Secondary | ICD-10-CM | POA: Diagnosis not present

## 2020-07-29 DIAGNOSIS — C946 Myelodysplastic disease, not classified: Secondary | ICD-10-CM | POA: Diagnosis not present

## 2020-07-29 DIAGNOSIS — D631 Anemia in chronic kidney disease: Secondary | ICD-10-CM | POA: Diagnosis not present

## 2020-07-29 DIAGNOSIS — D696 Thrombocytopenia, unspecified: Secondary | ICD-10-CM | POA: Diagnosis present

## 2020-07-29 DIAGNOSIS — R627 Adult failure to thrive: Secondary | ICD-10-CM | POA: Diagnosis present

## 2020-07-29 DIAGNOSIS — E43 Unspecified severe protein-calorie malnutrition: Secondary | ICD-10-CM | POA: Diagnosis present

## 2020-07-29 DIAGNOSIS — G2581 Restless legs syndrome: Secondary | ICD-10-CM | POA: Diagnosis present

## 2020-07-29 DIAGNOSIS — D75839 Thrombocytosis, unspecified: Secondary | ICD-10-CM | POA: Diagnosis not present

## 2020-07-29 DIAGNOSIS — J9691 Respiratory failure, unspecified with hypoxia: Secondary | ICD-10-CM | POA: Diagnosis not present

## 2020-07-29 DIAGNOSIS — L89154 Pressure ulcer of sacral region, stage 4: Secondary | ICD-10-CM | POA: Diagnosis present

## 2020-07-29 DIAGNOSIS — R52 Pain, unspecified: Secondary | ICD-10-CM | POA: Diagnosis not present

## 2020-07-29 DIAGNOSIS — J984 Other disorders of lung: Secondary | ICD-10-CM | POA: Diagnosis not present

## 2020-07-29 DIAGNOSIS — R491 Aphonia: Secondary | ICD-10-CM | POA: Diagnosis not present

## 2020-07-29 DIAGNOSIS — L89210 Pressure ulcer of right hip, unstageable: Secondary | ICD-10-CM | POA: Diagnosis present

## 2020-07-29 DIAGNOSIS — J9601 Acute respiratory failure with hypoxia: Secondary | ICD-10-CM | POA: Diagnosis present

## 2020-07-29 DIAGNOSIS — Z79899 Other long term (current) drug therapy: Secondary | ICD-10-CM | POA: Diagnosis not present

## 2020-07-29 DIAGNOSIS — D7581 Myelofibrosis: Secondary | ICD-10-CM | POA: Diagnosis present

## 2020-07-29 DIAGNOSIS — I081 Rheumatic disorders of both mitral and tricuspid valves: Secondary | ICD-10-CM | POA: Diagnosis not present

## 2020-07-29 DIAGNOSIS — Z7401 Bed confinement status: Secondary | ICD-10-CM | POA: Diagnosis not present

## 2020-07-29 DIAGNOSIS — D471 Chronic myeloproliferative disease: Secondary | ICD-10-CM | POA: Diagnosis not present

## 2020-07-29 DIAGNOSIS — D649 Anemia, unspecified: Secondary | ICD-10-CM | POA: Diagnosis present

## 2020-07-29 DIAGNOSIS — E039 Hypothyroidism, unspecified: Secondary | ICD-10-CM | POA: Diagnosis present

## 2020-07-29 DIAGNOSIS — G9340 Encephalopathy, unspecified: Secondary | ICD-10-CM | POA: Diagnosis not present

## 2020-07-29 DIAGNOSIS — Z20822 Contact with and (suspected) exposure to covid-19: Secondary | ICD-10-CM | POA: Diagnosis present

## 2020-07-29 DIAGNOSIS — A419 Sepsis, unspecified organism: Secondary | ICD-10-CM | POA: Diagnosis present

## 2020-07-29 DIAGNOSIS — Z7951 Long term (current) use of inhaled steroids: Secondary | ICD-10-CM | POA: Diagnosis not present

## 2020-07-29 DIAGNOSIS — E785 Hyperlipidemia, unspecified: Secondary | ICD-10-CM | POA: Diagnosis present

## 2020-07-29 DIAGNOSIS — Z9981 Dependence on supplemental oxygen: Secondary | ICD-10-CM | POA: Diagnosis not present

## 2020-07-29 DIAGNOSIS — Z792 Long term (current) use of antibiotics: Secondary | ICD-10-CM | POA: Diagnosis not present

## 2020-07-29 DIAGNOSIS — D473 Essential (hemorrhagic) thrombocythemia: Secondary | ICD-10-CM | POA: Diagnosis not present

## 2020-07-29 DIAGNOSIS — L89612 Pressure ulcer of right heel, stage 2: Secondary | ICD-10-CM | POA: Diagnosis not present

## 2020-07-29 DIAGNOSIS — R531 Weakness: Secondary | ICD-10-CM | POA: Diagnosis not present

## 2020-07-29 DIAGNOSIS — N3001 Acute cystitis with hematuria: Secondary | ICD-10-CM | POA: Diagnosis not present

## 2020-07-29 DIAGNOSIS — R778 Other specified abnormalities of plasma proteins: Secondary | ICD-10-CM | POA: Diagnosis not present

## 2020-07-29 DIAGNOSIS — L89610 Pressure ulcer of right heel, unstageable: Secondary | ICD-10-CM | POA: Diagnosis not present

## 2020-07-29 DIAGNOSIS — Z515 Encounter for palliative care: Secondary | ICD-10-CM | POA: Diagnosis not present

## 2020-07-29 DIAGNOSIS — R4182 Altered mental status, unspecified: Secondary | ICD-10-CM | POA: Diagnosis not present

## 2020-07-29 DIAGNOSIS — Z79818 Long term (current) use of other agents affecting estrogen receptors and estrogen levels: Secondary | ICD-10-CM | POA: Diagnosis not present

## 2020-07-29 DIAGNOSIS — R569 Unspecified convulsions: Secondary | ICD-10-CM | POA: Diagnosis not present

## 2020-07-29 DIAGNOSIS — N170 Acute kidney failure with tubular necrosis: Secondary | ICD-10-CM | POA: Diagnosis present

## 2020-07-29 DIAGNOSIS — R188 Other ascites: Secondary | ICD-10-CM | POA: Diagnosis not present

## 2020-07-29 DIAGNOSIS — E639 Nutritional deficiency, unspecified: Secondary | ICD-10-CM | POA: Diagnosis not present

## 2020-07-29 DIAGNOSIS — E1165 Type 2 diabetes mellitus with hyperglycemia: Secondary | ICD-10-CM | POA: Diagnosis not present

## 2020-07-29 DIAGNOSIS — R000 Tachycardia, unspecified: Secondary | ICD-10-CM | POA: Diagnosis not present

## 2020-07-29 DIAGNOSIS — I6930 Unspecified sequelae of cerebral infarction: Secondary | ICD-10-CM | POA: Diagnosis not present

## 2020-07-29 DIAGNOSIS — A491 Streptococcal infection, unspecified site: Secondary | ICD-10-CM | POA: Diagnosis not present

## 2020-07-29 DIAGNOSIS — I959 Hypotension, unspecified: Secondary | ICD-10-CM | POA: Diagnosis not present

## 2020-07-29 DIAGNOSIS — Z8673 Personal history of transient ischemic attack (TIA), and cerebral infarction without residual deficits: Secondary | ICD-10-CM | POA: Diagnosis not present

## 2020-07-29 DIAGNOSIS — R9401 Abnormal electroencephalogram [EEG]: Secondary | ICD-10-CM | POA: Diagnosis not present

## 2020-07-29 DIAGNOSIS — Z7901 Long term (current) use of anticoagulants: Secondary | ICD-10-CM | POA: Diagnosis not present

## 2020-07-29 DIAGNOSIS — N189 Chronic kidney disease, unspecified: Secondary | ICD-10-CM | POA: Diagnosis present

## 2020-07-29 DIAGNOSIS — L89620 Pressure ulcer of left heel, unstageable: Secondary | ICD-10-CM | POA: Diagnosis present

## 2020-07-29 DIAGNOSIS — G9341 Metabolic encephalopathy: Secondary | ICD-10-CM | POA: Diagnosis present

## 2020-07-29 DIAGNOSIS — L98429 Non-pressure chronic ulcer of back with unspecified severity: Secondary | ICD-10-CM | POA: Diagnosis not present

## 2020-07-29 DIAGNOSIS — Z8616 Personal history of COVID-19: Secondary | ICD-10-CM | POA: Diagnosis present

## 2020-07-29 DIAGNOSIS — J440 Chronic obstructive pulmonary disease with acute lower respiratory infection: Secondary | ICD-10-CM | POA: Diagnosis present

## 2020-07-29 DIAGNOSIS — J9600 Acute respiratory failure, unspecified whether with hypoxia or hypercapnia: Secondary | ICD-10-CM | POA: Diagnosis not present

## 2020-07-29 DIAGNOSIS — E876 Hypokalemia: Secondary | ICD-10-CM | POA: Diagnosis not present

## 2020-07-29 DIAGNOSIS — R5381 Other malaise: Secondary | ICD-10-CM | POA: Diagnosis not present

## 2020-07-29 DIAGNOSIS — M6281 Muscle weakness (generalized): Secondary | ICD-10-CM | POA: Diagnosis present

## 2020-07-29 DIAGNOSIS — R404 Transient alteration of awareness: Secondary | ICD-10-CM | POA: Diagnosis not present

## 2020-07-29 DIAGNOSIS — N179 Acute kidney failure, unspecified: Secondary | ICD-10-CM | POA: Diagnosis not present

## 2020-07-29 DIAGNOSIS — Z681 Body mass index (BMI) 19 or less, adult: Secondary | ICD-10-CM | POA: Diagnosis not present

## 2020-07-29 DIAGNOSIS — L89112 Pressure ulcer of right upper back, stage 2: Secondary | ICD-10-CM | POA: Diagnosis present

## 2020-07-29 DIAGNOSIS — J3800 Paralysis of vocal cords and larynx, unspecified: Secondary | ICD-10-CM | POA: Diagnosis present

## 2020-07-29 DIAGNOSIS — S31000A Unspecified open wound of lower back and pelvis without penetration into retroperitoneum, initial encounter: Secondary | ICD-10-CM | POA: Diagnosis not present

## 2020-07-29 DIAGNOSIS — I214 Non-ST elevation (NSTEMI) myocardial infarction: Secondary | ICD-10-CM | POA: Diagnosis not present

## 2020-07-29 DIAGNOSIS — M159 Polyosteoarthritis, unspecified: Secondary | ICD-10-CM | POA: Diagnosis present

## 2020-07-29 DIAGNOSIS — R279 Unspecified lack of coordination: Secondary | ICD-10-CM | POA: Diagnosis not present

## 2020-07-29 DIAGNOSIS — M4628 Osteomyelitis of vertebra, sacral and sacrococcygeal region: Secondary | ICD-10-CM | POA: Diagnosis present

## 2020-07-29 DIAGNOSIS — J188 Other pneumonia, unspecified organism: Secondary | ICD-10-CM | POA: Diagnosis not present

## 2020-07-29 DIAGNOSIS — Z9911 Dependence on respirator [ventilator] status: Secondary | ICD-10-CM | POA: Diagnosis not present

## 2020-07-29 DIAGNOSIS — I5021 Acute systolic (congestive) heart failure: Secondary | ICD-10-CM | POA: Diagnosis present

## 2020-07-29 DIAGNOSIS — E46 Unspecified protein-calorie malnutrition: Secondary | ICD-10-CM | POA: Diagnosis present

## 2020-07-29 DIAGNOSIS — J9811 Atelectasis: Secondary | ICD-10-CM | POA: Diagnosis not present

## 2020-07-29 DIAGNOSIS — I130 Hypertensive heart and chronic kidney disease with heart failure and stage 1 through stage 4 chronic kidney disease, or unspecified chronic kidney disease: Secondary | ICD-10-CM | POA: Diagnosis present

## 2020-07-29 DIAGNOSIS — F419 Anxiety disorder, unspecified: Secondary | ICD-10-CM | POA: Diagnosis present

## 2020-07-29 DIAGNOSIS — I252 Old myocardial infarction: Secondary | ICD-10-CM | POA: Diagnosis not present

## 2020-07-29 DIAGNOSIS — I429 Cardiomyopathy, unspecified: Secondary | ICD-10-CM | POA: Diagnosis present

## 2020-07-29 DIAGNOSIS — R1310 Dysphagia, unspecified: Secondary | ICD-10-CM | POA: Diagnosis present

## 2020-07-29 DIAGNOSIS — Z743 Need for continuous supervision: Secondary | ICD-10-CM | POA: Diagnosis not present

## 2020-08-13 ENCOUNTER — Other Ambulatory Visit: Payer: Self-pay | Admitting: Family Medicine

## 2020-08-13 DIAGNOSIS — G8929 Other chronic pain: Secondary | ICD-10-CM

## 2020-08-13 MED ORDER — CYCLOBENZAPRINE HCL 10 MG PO TABS: 10.0000 mg | ORAL_TABLET | Freq: Two times a day (BID) | ORAL | 3 refills | Status: DC | PRN

## 2020-08-13 NOTE — Telephone Encounter (Signed)
Request is too early, cannot have refill til next week

## 2020-08-13 NOTE — Telephone Encounter (Signed)
Requesting: oxycodone 10-325mg Contract: 04/15/2019 UDS: 10/09/2019 Last Visit: 06/21/2020 Next Visit: 10/04/2020 Last Refill: 07/21/2020 #75 and 0RF Pt sig: 1 tab q8h prn   Please Advise   

## 2020-08-13 NOTE — Telephone Encounter (Signed)
Medication:  oxyCODONE-acetaminophen (PERCOCET) 10-325 MG tablet [[id number]]  cyclobenzaprine (FLEXERIL) 10 MG tablet [[id number]]     Has the patient contacted their pharmacy?  (If no, request that the patient contact the pharmacy for the refill.) (If yes, when and what did the pharmacy advise?)     Preferred Pharmacy (with phone number or street name):   [facility], [place] - [address] [place], [place] [address]  Phone:  [phone] Fax:  [phone]   Agent: Please be advised that RX refills may take up to 3 business days. We ask that you follow-up with your pharmacy.

## 2020-08-16 ENCOUNTER — Other Ambulatory Visit: Payer: Self-pay | Admitting: Hematology & Oncology

## 2020-08-16 ENCOUNTER — Other Ambulatory Visit: Payer: Self-pay | Admitting: Family Medicine

## 2020-08-16 DIAGNOSIS — R300 Dysuria: Secondary | ICD-10-CM

## 2020-08-16 DIAGNOSIS — E559 Vitamin D deficiency, unspecified: Secondary | ICD-10-CM

## 2020-08-16 DIAGNOSIS — D500 Iron deficiency anemia secondary to blood loss (chronic): Secondary | ICD-10-CM

## 2020-08-16 DIAGNOSIS — R739 Hyperglycemia, unspecified: Secondary | ICD-10-CM

## 2020-08-16 DIAGNOSIS — H8110 Benign paroxysmal vertigo, unspecified ear: Secondary | ICD-10-CM

## 2020-08-16 DIAGNOSIS — I10 Essential (primary) hypertension: Secondary | ICD-10-CM

## 2020-08-17 ENCOUNTER — Other Ambulatory Visit: Payer: Self-pay | Admitting: Family Medicine

## 2020-08-17 ENCOUNTER — Telehealth: Payer: Self-pay | Admitting: Family Medicine

## 2020-08-17 DIAGNOSIS — G8929 Other chronic pain: Secondary | ICD-10-CM

## 2020-08-17 MED ORDER — OXYCODONE-ACETAMINOPHEN 10-325 MG PO TABS
1.0000 | ORAL_TABLET | Freq: Three times a day (TID) | ORAL | 0 refills | Status: DC | PRN
Start: 2020-08-17 — End: 2020-09-13

## 2020-08-17 NOTE — Telephone Encounter (Signed)
Refill done.  

## 2020-08-17 NOTE — Telephone Encounter (Signed)
Requesting: oxycodone 10-325mg  Contract: 04/15/2019 UDS: 10/09/2019 Last Visit: 06/21/2020 Next Visit: 10/04/2020 Last Refill: 07/21/2020 #75 and 0RF Pt sig: 1 tab q8h prn   Please Advise

## 2020-08-17 NOTE — Telephone Encounter (Signed)
Medication:  oxyCODONE-acetaminophen (PERCOCET) 10-325 MG tablet [[id number]]      Has the patient contacted their pharmacy?  (If no, request that the patient contact the pharmacy for the refill.) (If yes, when and what did the pharmacy advise?)     Preferred Pharmacy (with phone number or street name):    [place], [place] - [address] [place], [place] [address]  Phone:  [phone] Fax:  [phone]   Agent: Please be advised that RX refills may take up to 3 business days. We ask that you follow-up with your pharmacy.

## 2020-08-31 DIAGNOSIS — I5021 Acute systolic (congestive) heart failure: Secondary | ICD-10-CM | POA: Diagnosis not present

## 2020-08-31 DIAGNOSIS — Z87891 Personal history of nicotine dependence: Secondary | ICD-10-CM | POA: Diagnosis not present

## 2020-08-31 DIAGNOSIS — F418 Other specified anxiety disorders: Secondary | ICD-10-CM | POA: Diagnosis not present

## 2020-08-31 DIAGNOSIS — I6930 Unspecified sequelae of cerebral infarction: Secondary | ICD-10-CM | POA: Diagnosis not present

## 2020-08-31 DIAGNOSIS — D6959 Other secondary thrombocytopenia: Secondary | ICD-10-CM | POA: Diagnosis present

## 2020-08-31 DIAGNOSIS — R7889 Finding of other specified substances, not normally found in blood: Secondary | ICD-10-CM | POA: Diagnosis not present

## 2020-08-31 DIAGNOSIS — D500 Iron deficiency anemia secondary to blood loss (chronic): Secondary | ICD-10-CM | POA: Diagnosis present

## 2020-08-31 DIAGNOSIS — J449 Chronic obstructive pulmonary disease, unspecified: Secondary | ICD-10-CM | POA: Diagnosis not present

## 2020-08-31 DIAGNOSIS — N189 Chronic kidney disease, unspecified: Secondary | ICD-10-CM | POA: Diagnosis present

## 2020-08-31 DIAGNOSIS — Z79899 Other long term (current) drug therapy: Secondary | ICD-10-CM | POA: Diagnosis not present

## 2020-08-31 DIAGNOSIS — J3800 Paralysis of vocal cords and larynx, unspecified: Secondary | ICD-10-CM | POA: Diagnosis present

## 2020-08-31 DIAGNOSIS — Z7951 Long term (current) use of inhaled steroids: Secondary | ICD-10-CM | POA: Diagnosis not present

## 2020-08-31 DIAGNOSIS — C9590 Leukemia, unspecified not having achieved remission: Secondary | ICD-10-CM | POA: Diagnosis present

## 2020-08-31 DIAGNOSIS — L89613 Pressure ulcer of right heel, stage 3: Secondary | ICD-10-CM | POA: Diagnosis present

## 2020-08-31 DIAGNOSIS — L89626 Pressure-induced deep tissue damage of left heel: Secondary | ICD-10-CM | POA: Diagnosis present

## 2020-08-31 DIAGNOSIS — I639 Cerebral infarction, unspecified: Secondary | ICD-10-CM | POA: Diagnosis not present

## 2020-08-31 DIAGNOSIS — R1310 Dysphagia, unspecified: Secondary | ICD-10-CM | POA: Diagnosis present

## 2020-08-31 DIAGNOSIS — R279 Unspecified lack of coordination: Secondary | ICD-10-CM | POA: Diagnosis not present

## 2020-08-31 DIAGNOSIS — L89610 Pressure ulcer of right heel, unstageable: Secondary | ICD-10-CM | POA: Diagnosis present

## 2020-08-31 DIAGNOSIS — R4182 Altered mental status, unspecified: Secondary | ICD-10-CM | POA: Diagnosis not present

## 2020-08-31 DIAGNOSIS — N3000 Acute cystitis without hematuria: Secondary | ICD-10-CM | POA: Diagnosis present

## 2020-08-31 DIAGNOSIS — D649 Anemia, unspecified: Secondary | ICD-10-CM | POA: Diagnosis not present

## 2020-08-31 DIAGNOSIS — D638 Anemia in other chronic diseases classified elsewhere: Secondary | ICD-10-CM | POA: Diagnosis present

## 2020-08-31 DIAGNOSIS — N179 Acute kidney failure, unspecified: Secondary | ICD-10-CM | POA: Diagnosis present

## 2020-08-31 DIAGNOSIS — N390 Urinary tract infection, site not specified: Secondary | ICD-10-CM | POA: Diagnosis not present

## 2020-08-31 DIAGNOSIS — D693 Immune thrombocytopenic purpura: Secondary | ICD-10-CM | POA: Diagnosis present

## 2020-08-31 DIAGNOSIS — S0990XA Unspecified injury of head, initial encounter: Secondary | ICD-10-CM | POA: Diagnosis not present

## 2020-08-31 DIAGNOSIS — I10 Essential (primary) hypertension: Secondary | ICD-10-CM | POA: Diagnosis not present

## 2020-08-31 DIAGNOSIS — R627 Adult failure to thrive: Secondary | ICD-10-CM | POA: Diagnosis present

## 2020-08-31 DIAGNOSIS — R509 Fever, unspecified: Secondary | ICD-10-CM | POA: Diagnosis not present

## 2020-08-31 DIAGNOSIS — Z8616 Personal history of COVID-19: Secondary | ICD-10-CM | POA: Diagnosis present

## 2020-08-31 DIAGNOSIS — R531 Weakness: Secondary | ICD-10-CM | POA: Diagnosis not present

## 2020-08-31 DIAGNOSIS — D696 Thrombocytopenia, unspecified: Secondary | ICD-10-CM | POA: Diagnosis present

## 2020-08-31 DIAGNOSIS — F329 Major depressive disorder, single episode, unspecified: Secondary | ICD-10-CM | POA: Diagnosis present

## 2020-08-31 DIAGNOSIS — J1282 Pneumonia due to coronavirus disease 2019: Secondary | ICD-10-CM | POA: Diagnosis not present

## 2020-08-31 DIAGNOSIS — J3801 Paralysis of vocal cords and larynx, unilateral: Secondary | ICD-10-CM | POA: Diagnosis not present

## 2020-08-31 DIAGNOSIS — D520 Dietary folate deficiency anemia: Secondary | ICD-10-CM | POA: Diagnosis present

## 2020-08-31 DIAGNOSIS — M6281 Muscle weakness (generalized): Secondary | ICD-10-CM | POA: Diagnosis present

## 2020-08-31 DIAGNOSIS — E785 Hyperlipidemia, unspecified: Secondary | ICD-10-CM | POA: Diagnosis not present

## 2020-08-31 DIAGNOSIS — E162 Hypoglycemia, unspecified: Secondary | ICD-10-CM | POA: Diagnosis not present

## 2020-08-31 DIAGNOSIS — A419 Sepsis, unspecified organism: Secondary | ICD-10-CM | POA: Diagnosis not present

## 2020-08-31 DIAGNOSIS — G4089 Other seizures: Secondary | ICD-10-CM | POA: Diagnosis present

## 2020-08-31 DIAGNOSIS — L89210 Pressure ulcer of right hip, unstageable: Secondary | ICD-10-CM | POA: Diagnosis present

## 2020-08-31 DIAGNOSIS — Z6820 Body mass index (BMI) 20.0-20.9, adult: Secondary | ICD-10-CM | POA: Diagnosis not present

## 2020-08-31 DIAGNOSIS — R5381 Other malaise: Secondary | ICD-10-CM | POA: Diagnosis not present

## 2020-08-31 DIAGNOSIS — I5020 Unspecified systolic (congestive) heart failure: Secondary | ICD-10-CM | POA: Diagnosis present

## 2020-08-31 DIAGNOSIS — K219 Gastro-esophageal reflux disease without esophagitis: Secondary | ICD-10-CM | POA: Diagnosis present

## 2020-08-31 DIAGNOSIS — E46 Unspecified protein-calorie malnutrition: Secondary | ICD-10-CM | POA: Diagnosis present

## 2020-08-31 DIAGNOSIS — L8945 Pressure ulcer of contiguous site of back, buttock and hip, unstageable: Secondary | ICD-10-CM | POA: Diagnosis not present

## 2020-08-31 DIAGNOSIS — R300 Dysuria: Secondary | ICD-10-CM | POA: Diagnosis not present

## 2020-08-31 DIAGNOSIS — R000 Tachycardia, unspecified: Secondary | ICD-10-CM | POA: Diagnosis not present

## 2020-08-31 DIAGNOSIS — D539 Nutritional anemia, unspecified: Secondary | ICD-10-CM | POA: Diagnosis not present

## 2020-08-31 DIAGNOSIS — F419 Anxiety disorder, unspecified: Secondary | ICD-10-CM | POA: Diagnosis present

## 2020-08-31 DIAGNOSIS — E039 Hypothyroidism, unspecified: Secondary | ICD-10-CM | POA: Diagnosis not present

## 2020-08-31 DIAGNOSIS — D471 Chronic myeloproliferative disease: Secondary | ICD-10-CM | POA: Diagnosis present

## 2020-08-31 DIAGNOSIS — M159 Polyosteoarthritis, unspecified: Secondary | ICD-10-CM | POA: Diagnosis present

## 2020-08-31 DIAGNOSIS — R404 Transient alteration of awareness: Secondary | ICD-10-CM | POA: Diagnosis not present

## 2020-08-31 DIAGNOSIS — D473 Essential (hemorrhagic) thrombocythemia: Secondary | ICD-10-CM | POA: Diagnosis not present

## 2020-08-31 DIAGNOSIS — L89154 Pressure ulcer of sacral region, stage 4: Secondary | ICD-10-CM | POA: Diagnosis present

## 2020-08-31 DIAGNOSIS — L89614 Pressure ulcer of right heel, stage 4: Secondary | ICD-10-CM | POA: Diagnosis not present

## 2020-08-31 DIAGNOSIS — Z515 Encounter for palliative care: Secondary | ICD-10-CM | POA: Diagnosis not present

## 2020-08-31 DIAGNOSIS — D7581 Myelofibrosis: Secondary | ICD-10-CM | POA: Diagnosis present

## 2020-08-31 DIAGNOSIS — U071 COVID-19: Secondary | ICD-10-CM | POA: Diagnosis present

## 2020-08-31 DIAGNOSIS — G2581 Restless legs syndrome: Secondary | ICD-10-CM | POA: Diagnosis present

## 2020-08-31 DIAGNOSIS — E43 Unspecified severe protein-calorie malnutrition: Secondary | ICD-10-CM | POA: Diagnosis present

## 2020-08-31 DIAGNOSIS — Z7901 Long term (current) use of anticoagulants: Secondary | ICD-10-CM | POA: Diagnosis not present

## 2020-08-31 DIAGNOSIS — E440 Moderate protein-calorie malnutrition: Secondary | ICD-10-CM | POA: Diagnosis present

## 2020-08-31 DIAGNOSIS — N170 Acute kidney failure with tubular necrosis: Secondary | ICD-10-CM | POA: Diagnosis not present

## 2020-08-31 DIAGNOSIS — J984 Other disorders of lung: Secondary | ICD-10-CM | POA: Diagnosis not present

## 2020-08-31 DIAGNOSIS — Z743 Need for continuous supervision: Secondary | ICD-10-CM | POA: Diagnosis not present

## 2020-08-31 DIAGNOSIS — R6521 Severe sepsis with septic shock: Secondary | ICD-10-CM | POA: Diagnosis not present

## 2020-09-01 DIAGNOSIS — E43 Unspecified severe protein-calorie malnutrition: Secondary | ICD-10-CM | POA: Diagnosis not present

## 2020-09-01 DIAGNOSIS — L89154 Pressure ulcer of sacral region, stage 4: Secondary | ICD-10-CM | POA: Diagnosis not present

## 2020-09-01 DIAGNOSIS — L89614 Pressure ulcer of right heel, stage 4: Secondary | ICD-10-CM | POA: Diagnosis not present

## 2020-09-01 DIAGNOSIS — L89210 Pressure ulcer of right hip, unstageable: Secondary | ICD-10-CM | POA: Diagnosis not present

## 2020-09-02 DIAGNOSIS — J449 Chronic obstructive pulmonary disease, unspecified: Secondary | ICD-10-CM | POA: Diagnosis not present

## 2020-09-02 DIAGNOSIS — E43 Unspecified severe protein-calorie malnutrition: Secondary | ICD-10-CM | POA: Diagnosis not present

## 2020-09-02 DIAGNOSIS — D649 Anemia, unspecified: Secondary | ICD-10-CM | POA: Diagnosis not present

## 2020-09-02 DIAGNOSIS — I639 Cerebral infarction, unspecified: Secondary | ICD-10-CM | POA: Diagnosis not present

## 2020-09-02 DIAGNOSIS — R531 Weakness: Secondary | ICD-10-CM | POA: Diagnosis not present

## 2020-09-02 DIAGNOSIS — R627 Adult failure to thrive: Secondary | ICD-10-CM | POA: Diagnosis not present

## 2020-09-02 DIAGNOSIS — E039 Hypothyroidism, unspecified: Secondary | ICD-10-CM | POA: Diagnosis not present

## 2020-09-02 DIAGNOSIS — E162 Hypoglycemia, unspecified: Secondary | ICD-10-CM | POA: Diagnosis not present

## 2020-09-08 ENCOUNTER — Other Ambulatory Visit: Payer: Self-pay | Admitting: Family Medicine

## 2020-09-09 ENCOUNTER — Other Ambulatory Visit: Payer: Self-pay

## 2020-09-09 ENCOUNTER — Emergency Department (HOSPITAL_COMMUNITY): Payer: Medicare Other

## 2020-09-09 ENCOUNTER — Emergency Department (HOSPITAL_COMMUNITY)
Admission: EM | Admit: 2020-09-09 | Discharge: 2020-09-09 | Disposition: A | Discharge status: Home / Self Care | Payer: Medicare Other | Attending: Emergency Medicine | Admitting: Emergency Medicine

## 2020-09-09 ENCOUNTER — Encounter (HOSPITAL_COMMUNITY): Payer: Self-pay | Admitting: Emergency Medicine

## 2020-09-09 DIAGNOSIS — Z87891 Personal history of nicotine dependence: Secondary | ICD-10-CM | POA: Diagnosis not present

## 2020-09-09 DIAGNOSIS — Z7901 Long term (current) use of anticoagulants: Secondary | ICD-10-CM | POA: Insufficient documentation

## 2020-09-09 DIAGNOSIS — J449 Chronic obstructive pulmonary disease, unspecified: Secondary | ICD-10-CM | POA: Diagnosis not present

## 2020-09-09 DIAGNOSIS — I10 Essential (primary) hypertension: Secondary | ICD-10-CM | POA: Diagnosis not present

## 2020-09-09 DIAGNOSIS — E039 Hypothyroidism, unspecified: Secondary | ICD-10-CM | POA: Insufficient documentation

## 2020-09-09 DIAGNOSIS — N3000 Acute cystitis without hematuria: Secondary | ICD-10-CM | POA: Diagnosis not present

## 2020-09-09 DIAGNOSIS — Z7951 Long term (current) use of inhaled steroids: Secondary | ICD-10-CM | POA: Diagnosis not present

## 2020-09-09 DIAGNOSIS — U071 COVID-19: Secondary | ICD-10-CM | POA: Diagnosis not present

## 2020-09-09 DIAGNOSIS — Z79899 Other long term (current) drug therapy: Secondary | ICD-10-CM | POA: Insufficient documentation

## 2020-09-09 DIAGNOSIS — R509 Fever, unspecified: Secondary | ICD-10-CM | POA: Diagnosis not present

## 2020-09-09 DIAGNOSIS — R000 Tachycardia, unspecified: Secondary | ICD-10-CM | POA: Diagnosis not present

## 2020-09-09 LAB — CBC WITH DIFFERENTIAL/PLATELET
Abs Immature Granulocytes: 0.03 10*3/uL (ref 0.00–0.07)
Basophils Absolute: 0 10*3/uL (ref 0.0–0.1)
Basophils Relative: 1 %
Eosinophils Absolute: 0.1 10*3/uL (ref 0.0–0.5)
Eosinophils Relative: 5 %
HCT: 31.4 % — ABNORMAL LOW (ref 36.0–46.0)
Hemoglobin: 9.7 g/dL — ABNORMAL LOW (ref 12.0–15.0)
Immature Granulocytes: 1 %
Lymphocytes Relative: 15 %
Lymphs Abs: 0.3 10*3/uL — ABNORMAL LOW (ref 0.7–4.0)
MCH: 34.3 pg — ABNORMAL HIGH (ref 26.0–34.0)
MCHC: 30.9 g/dL (ref 30.0–36.0)
MCV: 111 fL — ABNORMAL HIGH (ref 80.0–100.0)
Monocytes Absolute: 0.1 10*3/uL (ref 0.1–1.0)
Monocytes Relative: 5 %
Neutro Abs: 1.6 10*3/uL — ABNORMAL LOW (ref 1.7–7.7)
Neutrophils Relative %: 73 %
Platelets: 438 10*3/uL — ABNORMAL HIGH (ref 150–400)
RBC: 2.83 MIL/uL — ABNORMAL LOW (ref 3.87–5.11)
RDW: 26.7 % — ABNORMAL HIGH (ref 11.5–15.5)
WBC: 2.2 10*3/uL — ABNORMAL LOW (ref 4.0–10.5)
nRBC: 0 % (ref 0.0–0.2)

## 2020-09-09 LAB — URINALYSIS, ROUTINE W REFLEX MICROSCOPIC
Bilirubin Urine: NEGATIVE
Glucose, UA: NEGATIVE mg/dL
Ketones, ur: NEGATIVE mg/dL
Nitrite: NEGATIVE
Protein, ur: 100 mg/dL — AB
Specific Gravity, Urine: 1.018 (ref 1.005–1.030)
WBC, UA: >50 WBC/hpf — ABNORMAL HIGH (ref 0–5)
pH: 5 (ref 5.0–8.0)

## 2020-09-09 LAB — RESP PANEL BY RT-PCR (FLU A&B, COVID) ARPGX2
Influenza A by PCR: NEGATIVE
Influenza B by PCR: NEGATIVE
SARS Coronavirus 2 by RT PCR: POSITIVE — AB

## 2020-09-09 LAB — PROTIME-INR
INR: 1 (ref 0.8–1.2)
Prothrombin Time: 12.8 seconds (ref 11.4–15.2)

## 2020-09-09 LAB — COMPREHENSIVE METABOLIC PANEL
ALT: 31 U/L (ref 0–44)
AST: 29 U/L (ref 15–41)
Albumin: 3.1 g/dL — ABNORMAL LOW (ref 3.5–5.0)
Alkaline Phosphatase: 149 U/L — ABNORMAL HIGH (ref 38–126)
Anion gap: 3 — ABNORMAL LOW (ref 5–15)
BUN: 19 mg/dL (ref 8–23)
CO2: 25 mmol/L (ref 22–32)
Calcium: 8.9 mg/dL (ref 8.9–10.3)
Chloride: 107 mmol/L (ref 98–111)
Creatinine, Ser: 0.57 mg/dL (ref 0.44–1.00)
GFR, Estimated: >60 mL/min (ref >60)
Glucose, Bld: 96 mg/dL (ref 70–99)
Potassium: 3.9 mmol/L (ref 3.5–5.1)
Sodium: 135 mmol/L (ref 135–145)
Total Bilirubin: 1 mg/dL (ref 0.3–1.2)
Total Protein: 7.3 g/dL (ref 6.5–8.1)

## 2020-09-09 LAB — MRSA PCR SCREENING: MRSA by PCR: NEGATIVE

## 2020-09-09 LAB — LACTIC ACID, PLASMA: Lactic Acid, Venous: 1 mmol/L (ref 0.5–1.9)

## 2020-09-09 MED ORDER — ACETAMINOPHEN 500 MG PO TABS
1000.0000 mg | ORAL_TABLET | Freq: Once | ORAL | Status: AC
  Administered 2020-09-09: 1000 mg via ORAL
  Filled 2020-09-09: qty 2

## 2020-09-09 MED ORDER — SODIUM CHLORIDE 0.9 % IV SOLN
2.0000 g | Freq: Once | INTRAVENOUS | Status: AC
  Administered 2020-09-09: 2 g via INTRAVENOUS
  Filled 2020-09-09: qty 2

## 2020-09-09 MED ORDER — VANCOMYCIN HCL IN DEXTROSE 1-5 GM/200ML-% IV SOLN
1000.0000 mg | Freq: Once | INTRAVENOUS | Status: AC
  Administered 2020-09-09: 1000 mg via INTRAVENOUS
  Filled 2020-09-09: qty 200

## 2020-09-09 MED ORDER — LACTATED RINGERS IV BOLUS
1000.0000 mL | Freq: Once | INTRAVENOUS | Status: AC
  Administered 2020-09-09: 1000 mL via INTRAVENOUS

## 2020-09-09 MED ORDER — CEPHALEXIN 500 MG PO CAPS: 500.0000 mg | ORAL_CAPSULE | Freq: Four times a day (QID) | ORAL | 0 refills | Status: DC

## 2020-09-09 MED ORDER — VANCOMYCIN HCL 750 MG/150ML IV SOLN
750.0000 mg | INTRAVENOUS | Status: DC
  Filled 2020-09-09: qty 150

## 2020-09-09 NOTE — Progress Notes (Signed)
Pharmacy Antibiotic Note  Rachael Johnson is a 70 y.o. female admitted on 09/09/2020 with pneumonia.  Pharmacy has been consulted for vancomycin dosing.  Plan: Give vancomycin 1g IV x1 dose, then start vancomycin 750mg  IV q24h Goal vancomycin trough range:  15-63mcg/mL Pharmacy will monitor labs, cultures and vancomycin levels as needed.  Weight: 54 kg (119 lb 0.8 oz)  Temp (24hrs), Avg:99.3 F (37.4 C), Min:98.6 F (37 C), Max:99.9 F (37.7 C)  Recent Labs  Lab 09/09/20 0655  WBC 2.2*  CREATININE 0.57  LATICACIDVEN 1.0    Estimated Creatinine Clearance: 57.4 mL/min (by C-G formula based on SCr of 0.57 mg/dL).    No Known Allergies  Antimicrobials this admission: vancomycin 2/10 >>   cefepime 2/10  >>    Microbiology results: 2/10  BC x2:    2/10 Resp PCR: SARS CoV-2 +      Flu A/B: negative 2/10 MRSA PCR:   Thank you for allowing pharmacy to be a part of this patient's care.  Despina Pole 09/09/2020 11:48 AM

## 2020-09-09 NOTE — ED Triage Notes (Addendum)
Pt is covid positive and was found in floor around 0200 with fever of 102.5. Pt is on 2 liters of o2 and is 92%. Pt is more confused. Facility gave pt 650 of tylenol and states last temp was 103.5. Pt also states it burns when she urinates.

## 2020-09-09 NOTE — ED Notes (Signed)
Patient on 12 lead at this time. EKG done upon arrival. Patient has sacral wound cover and two wounds on right hip. Patient has wound on right foot that is also bandaged.

## 2020-09-09 NOTE — ED Provider Notes (Signed)
[facility] Provider Note   CSN: [id number] Arrival date & time: 09/09/20  1308     History Chief Complaint  Patient presents with  . Fever    Rachael Johnson is a 70 y.o. female.  Patient came to the nursing home with fever and increased confusion.  Patient also complains of dysuria  The history is provided by the nursing home. No language interpreter was used.  Fever Temp source:  Oral Severity:  Mild Onset quality:  Sudden Timing:  Constant Progression:  Waxing and waning Chronicity:  New Relieved by:  Nothing      Past Medical History:  Diagnosis Date  . Acute renal insufficiency 04/09/2017  . Anemia, iron deficiency 07/08/2012  . Arthritis   . Cataract 12/21/2014  . COPD (chronic obstructive pulmonary disease) ([place]) 01/23/2017   01/23/2017   Walked RA  2 laps @ 185 ft each stopped due to  Cough > > sob/ no desat nl pace  . Depression with anxiety 03/24/2011  . Diverticulitis   . Esophageal reflux 08/17/2013  . Essential thrombocythemia ([place]) 01/24/2011  . Frequent episodic tension-type headache   . Gastroenteritis 12/21/2014  . 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 GERD (gastroesophageal reflux disease)   . H. pylori infection 12/19/2012  . Hyperglycemia 12/17/2015  . Hypertension   . Iron deficiency anemia due to chronic blood loss 04/03/2017  . Osteoarthritis 05/24/2017  . Other and unspecified hyperlipidemia 02/25/2013  . Restless leg syndrome 09/30/2014  . Rhabdomyolysis 02/2017  . Scabies 03/28/2015  . 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 Seizure ([place])    childhood  . Shoulder wound, right, sequela 03/14/2017  . Thyroid disease     Patient Active Problem List   Diagnosis Date Noted  . Severe protein-calorie malnutrition ([place])   . Adult failure to thrive   . Weakness   . Acute encephalopathy   . Palliative care by specialist   . Osteomyelitis ([place]) 04/17/2020  . Pressure injury of sacral region, stage 4 ([place]) 04/17/2020  . Decubitus ulcer  of left heel, unstageable ([place]) 04/17/2020  . Pernicious anemia 10/31/2019  . Hypokalemia 10/16/2019  . Insomnia 02/05/2019  . Urinary frequency 06/19/2018  . 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 Sinusitis 11/05/2017  . Chronic back pain 09/09/2017  . Osteoarthritis 05/24/2017  . Iron deficiency anemia due to chronic blood loss 04/03/2017  . Hypomagnesemia 03/18/2017  . Right shoulder pain 03/14/2017  . LFTs abnormal 03/13/2017  . Hypernatremia 03/13/2017  . Elevated LFTs   . COPD (chronic obstructive pulmonary disease) ([place]) 01/23/2017  . Lung nodule 12/18/2016  . Bronchiectasis assoc with MPNs 11/28/2016  . Cough variant asthma vs UACS 11/27/2016  . Vitamin D deficiency 03/07/2016  . Dysuria 03/07/2016  . Essential hypertension 03/07/2016  . Benign paroxysmal positional vertigo 03/07/2016  . Hyperglycemia 12/17/2015  . Cataract 12/21/2014  . Restless leg syndrome 09/30/2014  . Allergies 04/27/2014  . Constipation 11/24/2013  . Goals of care, counseling/discussion 04/20/2013  . Hyperlipidemia 02/25/2013  . H. pylori infection 12/19/2012  . Umbilical hernia 65/78/4696  . Macrocytic anemia 07/08/2012  . Weight loss 12/29/2011  . Depression with anxiety 03/24/2011  . 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 Hypothyroid 02/06/2011  . Thrombocythemia 01/24/2011    Past Surgical History:  Procedure Laterality Date  . ABDOMINAL HYSTERECTOMY     1992  . BREAST BIOPSY    . BREAST CYST ASPIRATION    . BREAST SURGERY  1992   biopsy, benign. Fibrocystic  . UMBILICAL  HERNIA REPAIR N/A 09/25/2012   Procedure: HERNIA REPAIR UMBILICAL ADULT;  Surgeon: Harl Bowie, MD;  Location: [facility];  Service: General;  Laterality: N/A;     OB History    Gravida  3   Para  2   Term      Preterm      AB  1   Living  2     SAB  1   IAB      Ectopic      Multiple      Live Births              Family History  Problem Relation Age of Onset  . Arthritis Mother   .  Cancer Mother        ovarian  . Hypertension Mother   . Heart disease Mother        pacer  . Heart failure Mother   . Arthritis Father   . Cancer Father        lung  . Hypertension Father   . Cancer Sister        lung  . Cancer Brother        prostate  . Cancer Brother        lung  . Hypertension Son   . COPD Brother   . Heart disease Brother        died from CHF  . Hypertension Brother   . Cancer Brother        colon  . Alcohol abuse Brother   . Cirrhosis Brother   . Seizures Sister   . Stroke Sister   . Arthritis Brother     Social History   Tobacco Use  . Smoking status: Former Smoker    Packs/day: 3.50    Years: 20.00    Pack years: 70.00    Types: Cigarettes    Start date: 11/15/1970    Quit date: 07/31/1990    Years since quitting: 30.1  . Smokeless tobacco: Never Used  . Tobacco comment: quit 23 years ago  Vaping Use  . Vaping Use: Never used  Substance Use Topics  . Alcohol use: No    Alcohol/week: 0.0 standard drinks  . Drug use: No    Home Medications Prior to Admission medications   Medication Sig Start Date End Date Taking? Authorizing Provider  albuterol (VENTOLIN HFA) 108 (90 Base) MCG/ACT inhaler INHALE 2 PUFFS BY MOUTH EVERY 6 HOURS AS NEEDED FOR WHEEZING OR SHORTNESS OF BREATH 06/09/19  Yes Mosie Lukes, MD  ALPRAZolam Duanne Moron) 1 MG tablet TAKE 1 TABLET BY MOUTH THREE TIMES DAILY AS NEEDED FOR ANXIETY 07/16/20  Yes Mosie Lukes, MD  alum & mag hydroxide-simeth (GELUSIL) 200-200-25 MG chewable tablet Chew by mouth. 04/08/19  Yes [provider]  atorvastatin (LIPITOR) 10 MG tablet Take 1 tablet (10 mg total) by mouth daily. 05/18/20  Yes Mosie Lukes, MD  budesonide-formoterol (SYMBICORT) 80-4.5 MCG/ACT inhaler Inhale 2 puffs into the lungs 2 (two) times daily. 06/09/19  Yes Mosie Lukes, MD  cephALEXin (KEFLEX) 500 MG capsule Take 1 capsule (500 mg total) by mouth 4 (four) times daily. 09/09/20  Yes Milton Ferguson, MD   collagenase (SANTYL) ointment Apply topically 2 (two) times daily. Apply twice a day to sacral decubitus ulcer. 06/29/20  Yes Mosie Lukes, MD  cyanocobalamin (,VITAMIN B-12,) 1000 MCG/ML injection Inject 1 ml into muscle q week x 4 then q monthly Patient taking differently: Inject 1,000 mcg into  the muscle every 30 (thirty) days. 10/31/19  Yes [name], MD  enoxaparin (LOVENOX) 40 MG/0.4ML injection Inject 40 mg into the skin daily.   Yes [provider]  estradiol (ESTRACE) 0.5 MG tablet Take 1 tablet (0.5 mg total) by mouth 2 (two) times daily. 06/14/20  Yes Mosie Lukes, MD  famotidine (PEPCID) 20 MG tablet Take 20 mg by mouth 2 (two) times daily. 03/04/20  Yes [provider]  fluticasone (FLONASE) 50 MCG/ACT nasal spray INSTILL TWO (2) SPRAYS IN EACH NOSTRIL ONCE DAILY 08/21/19  Yes Mosie Lukes, MD  Fluticasone-Salmeterol (ADVAIR) 250-50 MCG/DOSE AEPB Inhale 1 puff into the lungs 2 (two) times daily.   Yes [provider]  folic acid (FOLVITE) 440 MCG tablet Take 400 mcg by mouth daily.   Yes [provider]  hydroxyurea (HYDREA) 500 MG capsule Take 1,000 mg by mouth daily. May take with food to minimize GI side effects.   Yes [provider]  hydroxyurea (HYDREA) 500 MG capsule Take 1,500 mg by mouth at bedtime. May take with food to minimize GI side effects.   Yes [provider]  ibuprofen (ADVIL) 400 MG tablet Take 400 mg by mouth every 8 (eight) hours as needed for mild pain.   Yes [provider]  ipratropium-albuterol (DUONEB) 0.5-2.5 (3) MG/3ML SOLN Take 3 mLs by nebulization every 4 (four) hours as needed.   Yes [provider]  levothyroxine (SYNTHROID) 125 MCG tablet Take 125 mcg by mouth daily before breakfast.   Yes [provider]  montelukast (SINGULAIR) 10 MG tablet Take 1 tablet (10 mg total) by mouth at bedtime. 06/14/20  Yes Mosie Lukes, MD  Multiple Vitamin (MULTIVITAMIN PO)  Take 1 tablet by mouth every morning.   Yes [provider]  ondansetron (ZOFRAN) 8 MG tablet Take 0.5-1 tablets (4-8 mg total) by mouth every 8 (eight) hours as needed for nausea or vomiting. 06/14/20  Yes Mosie Lukes, MD  oxyCODONE-acetaminophen (PERCOCET) 10-325 MG tablet Take 1 tablet by mouth every 8 (eight) hours as needed for pain (severe pain). 08/17/20  Yes Mosie Lukes, MD  thiamine 100 MG tablet Take 100 mg by mouth daily.   Yes [provider]  acetaminophen (TYLENOL) 325 MG tablet Take 2 tablets (650 mg total) by mouth every 6 (six) hours as needed for mild pain or headache (or Fever >/= 101). Patient not taking: No sig reported 04/21/20   Barton Dubois, MD  ALPRAZolam Duanne Moron) 0.25 MG tablet Take 1-2 tablets (0.25-0.5 mg total) by mouth every 8 (eight) hours as needed for anxiety. Patient not taking: No sig reported 04/21/20   Barton Dubois, MD  B-D 3CC LUER-LOK SYR 25GX1" 25G X 1" 3 ML MISC Inject into the skin. Patient not taking: No sig reported 02/23/20   [provider]  B-D 3CC LUER-LOK SYR 25GX1" 25G X 1" 3 ML MISC INJECT 1 ML SUBCUTANEOUSLY ONCE A WEEK THEN INJECT 1 ML ONCE EVERY MONTH Patient not taking: No sig reported 06/15/20   [name], MD  busPIRone (BUSPAR) 15 MG tablet Take 1 tablet (15 mg total) by mouth 4 (four) times daily as needed. Patient not taking: No sig reported 05/18/20   Mosie Lukes, MD  chlorpheniramine (ALLER-CHLOR) 4 MG tablet Take 2 tablets (8 mg total) by mouth at bedtime as needed for allergies or rhinitis. Patient not taking: No sig reported 09/08/20   Mosie Lukes, MD  cyclobenzaprine (FLEXERIL) 10 MG  tablet Take 1 tablet (10 mg total) by mouth 2 (two) times daily as needed for muscle spasms. Patient not taking: No sig reported 08/13/20   Mosie Lukes, MD  diphenhydrAMINE (EQ ALLERGY RELIEF CHILDRENS) 12.5 MG/5ML liquid TAKE 5 ML BY MOUTH THREE TIMES DAILY AS NEEDED FOR MOUTH  PAIN Patient not taking:  No sig reported 10/10/19   Mosie Lukes, MD  furosemide (LASIX) 20 MG tablet TAKE 1 TABLET BY MOUTH THREE TIMES DAILY AS NEEDED FOR EDEMA Patient not taking: No sig reported 08/16/20   Mosie Lukes, MD  gabapentin (NEURONTIN) 100 MG capsule TAKE FOUR (4) CAPSULES BY MOUTH EVERY NIGHT AT BEDTIME Patient not taking: No sig reported 08/16/20   Mosie Lukes, MD  levothyroxine (SYNTHROID) 100 MCG tablet TAKE 1 TABLET BY MOUTH EVERY MORNING BEFORE BREAKFAST Patient not taking: No sig reported 01/12/20   Mosie Lukes, MD  linaclotide Rolan Lipa) 145 MCG CAPS capsule Take 1 capsule (145 mcg total) by mouth daily before breakfast. Patient not taking: No sig reported 04/22/20   Barton Dubois, MD  magic mouthwash SOLN Take 5 mLs by mouth 3 (three) times daily as needed for mouth pain. Components benadryl  525 mg, hydrocortisone 60 mg and nystatin 0.6 mg. 240 ml - Oral Patient not taking: No sig reported 04/08/19   [name], MD  meclizine (ANTIVERT) 12.5 MG tablet TAKE 1 TABLET BY MOUTH THREE TIMES DAILY AS NEEDED FOR DIZZINESS Patient not taking: No sig reported 08/16/20   Mosie Lukes, MD  Nutritional Supplements (ENSURE ACTIVE HIGH PROTEIN) LIQD Take 1 bottle TID between meals. Patient not taking: No sig reported 04/21/20   Barton Dubois, MD  nystatin (MYCOSTATIN) 100000 UNIT/ML suspension Take 5 mLs (500,000 Units total) by mouth 4 (four) times daily as needed (thrush). Patient not taking: No sig reported 04/21/20   Barton Dubois, MD  PEGASYS 180 MCG/ML injection INJECT 0.25 MLS (45 MCG TOTAL) INTO THE SKIN EVERY 7 (SEVEN) DAYS. SINGLE USE VIALS, AFTER INJECTION DISPOSE OF REMAINING VIAL CONTENT. Patient not taking: No sig reported 06/07/20   [name], MD  polyethylene glycol (MIRALAX / GLYCOLAX) 17 g packet Take 17 g by mouth daily as needed for mild constipation. Patient not taking: No sig reported 04/21/20   Barton Dubois, MD  potassium chloride SA (KLOR-CON) 20 MEQ tablet Take 1  tablet (20 mEq total) by mouth daily. Patient not taking: No sig reported 04/21/20   Barton Dubois, MD  RESTASIS 0.05 % ophthalmic emulsion PLACE ONE DROP INTO EACH EYE EVERY 12 HOURS Patient not taking: No sig reported 06/09/19   Mosie Lukes, MD  rivaroxaban (XARELTO) 10 MG TABS tablet Take 1 tablet (10 mg total) by mouth daily. Patient not taking: No sig reported 03/18/20   [name], MD  SANCUSO 3.1 MG/24HR APPLY 1 PATCH TOPICALLY AS NEEDED FOR NAUSEA, REMOVE AFTER 7 DAYS Patient not taking: No sig reported 08/16/20   [name], MD  sulfamethoxazole-trimethoprim (BACTRIM DS) 800-160 MG tablet Take 1 tablet by mouth 2 (two) times daily. Patient not taking: No sig reported 06/29/20   Saguier, Percell Miller, PA-C  SYRINGE-NEEDLE, DISP, 3 ML (B-D 3CC LUER-LOK SYR 25GX1") 25G X 1" 3 ML MISC 1 Syringe by Subdermal route once a week. Patient not taking: No sig reported 12/02/19   [provider]  traZODone (DESYREL) 100 MG tablet TAKE 1 TABLET BY MOUTH AT BEDTIME Patient not taking: No sig reported 08/16/20   Penni Homans  A, MD  TUBERCULIN SYR 1CC/27GX1/2" (B-D TB SYRINGE 1CC/27GX1/2") 27G X 1/2" 1 ML MISC USE AS DIRECTED ONCE WEEKLY FOR PEG-INTERFERON INJECTIONS Patient not taking: No sig reported 09/09/19   [name], MD  venlafaxine XR (EFFEXOR-XR) 75 MG 24 hr capsule TAKE 1 CAPSULE BY MOUTH THREE TIMES DAILY Patient not taking: No sig reported 08/16/20   Mosie Lukes, MD    Allergies    Patient has no known allergies.  Review of Systems   Review of Systems  Unable to perform ROS: Mental status change  Constitutional: Positive for fever.    Physical Exam Updated Vital Signs BP 131/65   Pulse (!) 101   Temp 98.6 F (37 C) (Oral)   Resp (!) 24   Wt 54 kg   LMP 07/31/1990   SpO2 99%   BMI 20.43 kg/m   Physical Exam Vitals and nursing note reviewed.  Constitutional:      Appearance: She is well-developed.  HENT:     Head: Normocephalic.     Nose: Nose  normal.  Eyes:     General: No scleral icterus.    Extraocular Movements: EOM normal.     Conjunctiva/sclera: Conjunctivae normal.  Neck:     Thyroid: No thyromegaly.  Cardiovascular:     Rate and Rhythm: Normal rate and regular rhythm.     Heart sounds: No murmur heard. No friction rub. No gallop.   Pulmonary:     Breath sounds: No stridor. No wheezing or rales.  Chest:     Chest wall: No tenderness.  Abdominal:     General: There is no distension.     Tenderness: There is no abdominal tenderness. There is no rebound.  Musculoskeletal:        General: No edema. Normal range of motion.     Cervical back: Neck supple.  Lymphadenopathy:     Cervical: No cervical adenopathy.  Skin:    Findings: No erythema or rash.  Neurological:     Mental Status: She is alert and oriented to person, place, and time.     Motor: No abnormal muscle tone.     Coordination: Coordination normal.  Psychiatric:        Mood and Affect: Mood and affect normal.        Behavior: Behavior normal.     ED Results / Procedures / Treatments   Labs (all labs ordered are listed, but only abnormal results are displayed) Labs Reviewed  RESP PANEL BY RT-PCR (FLU A&B, COVID) ARPGX2 - Abnormal; Notable for the following components:      Result Value   SARS Coronavirus 2 by RT PCR POSITIVE (*)    All other components within normal limits  COMPREHENSIVE METABOLIC PANEL - Abnormal; Notable for the following components:   Albumin 3.1 (*)    Alkaline Phosphatase 149 (*)    Anion gap 3 (*)    All other components within normal limits  CBC WITH DIFFERENTIAL/PLATELET - Abnormal; Notable for the following components:   WBC 2.2 (*)    RBC 2.83 (*)    Hemoglobin 9.7 (*)    HCT 31.4 (*)    MCV 111.0 (*)    MCH 34.3 (*)    RDW 26.7 (*)    Platelets 438 (*)    Neutro Abs 1.6 (*)    Lymphs Abs 0.3 (*)    All other components within normal limits  URINALYSIS, ROUTINE W REFLEX MICROSCOPIC - Abnormal; Notable for  the following components:  APPearance HAZY (*)    Hgb urine dipstick MODERATE (*)    Protein, ur 100 (*)    Leukocytes,Ua LARGE (*)    WBC, UA >50 (*)    Bacteria, UA RARE (*)    All other components within normal limits  CULTURE, BLOOD (ROUTINE X 2)  CULTURE, BLOOD (ROUTINE X 2)  MRSA PCR SCREENING  URINE CULTURE  LACTIC ACID, PLASMA  PROTIME-INR    EKG None  Radiology DG Chest Portable 1 View  Result Date: 09/09/2020 CLINICAL DATA:  COVID positive with fever. EXAM: PORTABLE CHEST 1 VIEW COMPARISON:  07/28/2020 FINDINGS: 0645 hours. Cardiopericardial silhouette is at upper limits of normal for size. Interstitial markings are diffusely coarsened with chronic features. Patchy airspace disease in the upper lungs bilaterally, improved on the right since prior study. The visualized bony structures of the thorax show no acute abnormality. Telemetry leads overlie the chest. IMPRESSION: Chronic interstitial coarsening with patchy upper lung predominant bibasilar airspace opacities. Electronically Signed   By: Misty Stanley M.D.   On: 09/09/2020 07:00    Procedures Procedures   Medications Ordered in ED Medications  vancomycin (VANCOREADY) IVPB 750 mg/150 mL (has no administration in time range)  lactated ringers bolus 1,000 mL (0 mLs Intravenous Stopped 09/09/20 1009)  acetaminophen (TYLENOL) tablet 1,000 mg (1,000 mg Oral Given 09/09/20 0716)  ceFEPIme (MAXIPIME) 2 g in sodium chloride 0.9 % 100 mL IVPB (0 g Intravenous Stopped 09/09/20 1009)  vancomycin (VANCOCIN) IVPB 1000 mg/200 mL premix (0 mg Intravenous Stopped 09/09/20 1211)    ED Course  I have reviewed the triage vital signs and the nursing notes.  Pertinent labs & imaging results that were available during my care of the patient were reviewed by me and considered in my medical decision making (see chart for details).    MDM Rules/Calculators/A&P                          Patient with positive COVID.  She had a positive  Covid a few days ago also.  Urinary tract is infected.  Patient will be placed on antibiotics and sent back to the nursing home Final Clinical Impression(s) / ED Diagnoses Final diagnoses:  Acute cystitis without hematuria  COVID-19    Rx / DC Orders ED Discharge Orders         Ordered    cephALEXin (KEFLEX) 500 MG capsule  4 times daily        09/09/20 1309           Milton Ferguson, MD 09/12/20 1051

## 2020-09-09 NOTE — Discharge Instructions (Addendum)
Take Tylenol for any fever drink plenty of fluids.  Follow-up with your doctor next week for recheck

## 2020-09-09 NOTE — ED Notes (Signed)
covid positive on 01/07/23 per [facility]

## 2020-09-09 NOTE — ED Notes (Signed)
Pt has sacral wound and open places to the right hip.

## 2020-09-10 ENCOUNTER — Telehealth: Payer: Self-pay | Admitting: Family

## 2020-09-10 ENCOUNTER — Telehealth: Payer: Self-pay | Admitting: Family Medicine

## 2020-09-10 NOTE — Telephone Encounter (Signed)
Lvm about instructions

## 2020-09-10 NOTE — Telephone Encounter (Signed)
Requesting:percocet 10-325 mg tablet Contract:10/09/19 UDS:10/09/19 Last Visit:06/21/20 Next Visit:10/04/20 Last Refill:08/17/20  Please Advise

## 2020-09-10 NOTE — Telephone Encounter (Signed)
Medication:oxyCODONE-acetaminophen (PERCOCET) 10-325 MG tablet [[id number]]     Has the patient contacted their pharmacy? NO (If no, request that the patient contact the pharmacy for the refill.) (If yes, when and what did the pharmacy advise?)    Preferred Pharmacy (with phone number or street name):    [facility], [place] - [address] [place], [place] [address]  Phone:  [phone] Fax:  [phone]    Agent: Please be advised that RX refills may take up to 3 business days. We ask that you follow-up with your pharmacy.

## 2020-09-10 NOTE — Telephone Encounter (Signed)
She can have a refill early next week, it is too early now

## 2020-09-10 NOTE — Telephone Encounter (Signed)
Called to discuss with patient about COVID-19 symptoms and the use of one of the available treatments for those with mild to moderate Covid symptoms and at a high risk of hospitalization.  Pt appears to qualify for outpatient treatment due to co-morbid conditions and/or a member of an at-risk group in accordance with the FDA Emergency Use Authorization.    Symptom onset: Unknown  Vaccinated: Yes Booster? No Immunocompromised? No Qualifiers: COPD, hypertension, age, elevated SVI score  Rachael Johnson was seen in the ED with fever and noted to have had a positive Covid test on 2/8 per her nursing facility. I attempted to contact Rachael Johnson to obtain additional information to determine if she qualifies for treatment. I was unable to reach her via phone and the mailbox was full. MyChart message was sent.    Terri Piedra, NP 09/10/2020 8:35 AM

## 2020-09-11 LAB — URINE CULTURE: Culture: NO GROWTH

## 2020-09-13 ENCOUNTER — Telehealth: Payer: Self-pay | Admitting: Family Medicine

## 2020-09-13 ENCOUNTER — Other Ambulatory Visit: Payer: Self-pay | Admitting: Family Medicine

## 2020-09-13 DIAGNOSIS — G8929 Other chronic pain: Secondary | ICD-10-CM

## 2020-09-13 MED ORDER — OXYCODONE-ACETAMINOPHEN 10-325 MG PO TABS: 1.0000 | ORAL_TABLET | Freq: Three times a day (TID) | ORAL | 0 refills | Status: DC | PRN

## 2020-09-13 NOTE — Telephone Encounter (Signed)
Requesting:oxycodone Contract:04/15/19 UDS: 10/09/19 Last Visit: 06/21/20 Next Visit: 10/04/20 Last Refill: 08/17/20  Please Advise

## 2020-09-13 NOTE — Telephone Encounter (Signed)
Medication: oxyCODONE-acetaminophen (PERCOCET) 10-325 MG tablet [[id number]]      Has the patient contacted their pharmacy? no (If no, request that the patient contact the pharmacy for the refill.) (If yes, when and what did the pharmacy advise?)    Preferred Pharmacy (with phone number or street name):   [facility], [place] [address] Phone:  [phone]  Fax:  [phone]        Agent: Please be advised that RX refills may take up to 3 business days. We ask that you follow-up with your pharmacy.

## 2020-09-13 NOTE — Telephone Encounter (Signed)
done

## 2020-09-14 DIAGNOSIS — R5381 Other malaise: Secondary | ICD-10-CM | POA: Diagnosis not present

## 2020-09-14 LAB — CULTURE, BLOOD (ROUTINE X 2)
Culture: NO GROWTH
Culture: NO GROWTH
Special Requests: ADEQUATE
Special Requests: ADEQUATE

## 2020-09-16 DIAGNOSIS — J1282 Pneumonia due to coronavirus disease 2019: Secondary | ICD-10-CM | POA: Diagnosis not present

## 2020-09-16 DIAGNOSIS — R5381 Other malaise: Secondary | ICD-10-CM | POA: Diagnosis not present

## 2020-09-16 DIAGNOSIS — J449 Chronic obstructive pulmonary disease, unspecified: Secondary | ICD-10-CM | POA: Diagnosis not present

## 2020-09-16 DIAGNOSIS — E039 Hypothyroidism, unspecified: Secondary | ICD-10-CM | POA: Diagnosis not present

## 2020-09-16 DIAGNOSIS — U071 COVID-19: Secondary | ICD-10-CM | POA: Diagnosis not present

## 2020-09-20 ENCOUNTER — Telehealth: Payer: Self-pay | Admitting: Family Medicine

## 2020-09-20 ENCOUNTER — Other Ambulatory Visit: Payer: Self-pay

## 2020-09-20 MED ORDER — ONDANSETRON HCL 8 MG PO TABS: 4.0000 mg | ORAL_TABLET | Freq: Three times a day (TID) | ORAL | 2 refills | Status: AC | PRN

## 2020-09-20 NOTE — Telephone Encounter (Signed)
Sent in

## 2020-09-20 NOTE — Telephone Encounter (Signed)
Medication: ondansetron (ZOFRAN) 8 MG tablet [  Has the patient contacted their pharmacy? Yes.   (If no, request that the patient contact the pharmacy for the refill.) (If yes, when and what did the pharmacy advise?)  Preferred Pharmacy (with phone number or street name):  [facility], [place]  [address], [place] [address]  Phone:  [phone] Fax:  [phone]   Agent: Please be advised that RX refills may take up to 3 business days. We ask that you follow-up with your pharmacy.

## 2020-09-22 DIAGNOSIS — E43 Unspecified severe protein-calorie malnutrition: Secondary | ICD-10-CM | POA: Diagnosis not present

## 2020-09-22 DIAGNOSIS — L89210 Pressure ulcer of right hip, unstageable: Secondary | ICD-10-CM | POA: Diagnosis not present

## 2020-09-22 DIAGNOSIS — L89154 Pressure ulcer of sacral region, stage 4: Secondary | ICD-10-CM | POA: Diagnosis not present

## 2020-09-22 DIAGNOSIS — L89614 Pressure ulcer of right heel, stage 4: Secondary | ICD-10-CM | POA: Diagnosis not present

## 2020-09-29 DIAGNOSIS — E43 Unspecified severe protein-calorie malnutrition: Secondary | ICD-10-CM | POA: Diagnosis not present

## 2020-09-29 DIAGNOSIS — L89210 Pressure ulcer of right hip, unstageable: Secondary | ICD-10-CM | POA: Diagnosis not present

## 2020-09-29 DIAGNOSIS — L89614 Pressure ulcer of right heel, stage 4: Secondary | ICD-10-CM | POA: Diagnosis not present

## 2020-09-29 DIAGNOSIS — L89154 Pressure ulcer of sacral region, stage 4: Secondary | ICD-10-CM | POA: Diagnosis not present

## 2020-09-30 ENCOUNTER — Inpatient Hospital Stay (HOSPITAL_COMMUNITY)
Admission: EM | Admit: 2020-09-30 | Discharge: 2020-10-03 | DRG: 811 | Disposition: A | Discharge status: Hospice | Payer: Medicare Other | Source: Skilled Nursing Facility | Attending: Internal Medicine | Admitting: Internal Medicine

## 2020-09-30 ENCOUNTER — Other Ambulatory Visit: Payer: Self-pay

## 2020-09-30 ENCOUNTER — Encounter (HOSPITAL_COMMUNITY): Payer: Self-pay | Admitting: Emergency Medicine

## 2020-09-30 DIAGNOSIS — E039 Hypothyroidism, unspecified: Secondary | ICD-10-CM | POA: Diagnosis present

## 2020-09-30 DIAGNOSIS — Z515 Encounter for palliative care: Secondary | ICD-10-CM

## 2020-09-30 DIAGNOSIS — N3000 Acute cystitis without hematuria: Secondary | ICD-10-CM | POA: Diagnosis present

## 2020-09-30 DIAGNOSIS — D649 Anemia, unspecified: Secondary | ICD-10-CM

## 2020-09-30 DIAGNOSIS — Z8042 Family history of malignant neoplasm of prostate: Secondary | ICD-10-CM

## 2020-09-30 DIAGNOSIS — D638 Anemia in other chronic diseases classified elsewhere: Secondary | ICD-10-CM | POA: Diagnosis present

## 2020-09-30 DIAGNOSIS — Z800 Family history of malignant neoplasm of digestive organs: Secondary | ICD-10-CM

## 2020-09-30 DIAGNOSIS — L89613 Pressure ulcer of right heel, stage 3: Secondary | ICD-10-CM | POA: Diagnosis present

## 2020-09-30 DIAGNOSIS — Z825 Family history of asthma and other chronic lower respiratory diseases: Secondary | ICD-10-CM

## 2020-09-30 DIAGNOSIS — D500 Iron deficiency anemia secondary to blood loss (chronic): Secondary | ICD-10-CM | POA: Diagnosis not present

## 2020-09-30 DIAGNOSIS — D693 Immune thrombocytopenic purpura: Secondary | ICD-10-CM | POA: Diagnosis present

## 2020-09-30 DIAGNOSIS — Z6820 Body mass index (BMI) 20.0-20.9, adult: Secondary | ICD-10-CM

## 2020-09-30 DIAGNOSIS — K219 Gastro-esophageal reflux disease without esophagitis: Secondary | ICD-10-CM | POA: Diagnosis present

## 2020-09-30 DIAGNOSIS — E440 Moderate protein-calorie malnutrition: Secondary | ICD-10-CM | POA: Diagnosis present

## 2020-09-30 DIAGNOSIS — F418 Other specified anxiety disorders: Secondary | ICD-10-CM | POA: Diagnosis present

## 2020-09-30 DIAGNOSIS — D539 Nutritional anemia, unspecified: Secondary | ICD-10-CM | POA: Diagnosis present

## 2020-09-30 DIAGNOSIS — Z7902 Long term (current) use of antithrombotics/antiplatelets: Secondary | ICD-10-CM

## 2020-09-30 DIAGNOSIS — D520 Dietary folate deficiency anemia: Secondary | ICD-10-CM | POA: Diagnosis present

## 2020-09-30 DIAGNOSIS — Z79890 Hormone replacement therapy (postmenopausal): Secondary | ICD-10-CM

## 2020-09-30 DIAGNOSIS — Z79899 Other long term (current) drug therapy: Secondary | ICD-10-CM

## 2020-09-30 DIAGNOSIS — D6959 Other secondary thrombocytopenia: Secondary | ICD-10-CM | POA: Diagnosis present

## 2020-09-30 DIAGNOSIS — Z87891 Personal history of nicotine dependence: Secondary | ICD-10-CM

## 2020-09-30 DIAGNOSIS — J449 Chronic obstructive pulmonary disease, unspecified: Secondary | ICD-10-CM | POA: Diagnosis present

## 2020-09-30 DIAGNOSIS — M159 Polyosteoarthritis, unspecified: Secondary | ICD-10-CM | POA: Diagnosis present

## 2020-09-30 DIAGNOSIS — I10 Essential (primary) hypertension: Secondary | ICD-10-CM | POA: Diagnosis present

## 2020-09-30 DIAGNOSIS — Z823 Family history of stroke: Secondary | ICD-10-CM

## 2020-09-30 DIAGNOSIS — L89626 Pressure-induced deep tissue damage of left heel: Secondary | ICD-10-CM | POA: Diagnosis present

## 2020-09-30 DIAGNOSIS — L8990 Pressure ulcer of unspecified site, unspecified stage: Secondary | ICD-10-CM | POA: Insufficient documentation

## 2020-09-30 DIAGNOSIS — Z801 Family history of malignant neoplasm of trachea, bronchus and lung: Secondary | ICD-10-CM

## 2020-09-30 DIAGNOSIS — L8945 Pressure ulcer of contiguous site of back, buttock and hip, unstageable: Secondary | ICD-10-CM | POA: Diagnosis not present

## 2020-09-30 DIAGNOSIS — L89154 Pressure ulcer of sacral region, stage 4: Secondary | ICD-10-CM | POA: Diagnosis present

## 2020-09-30 DIAGNOSIS — R627 Adult failure to thrive: Secondary | ICD-10-CM | POA: Diagnosis present

## 2020-09-30 DIAGNOSIS — C9590 Leukemia, unspecified not having achieved remission: Secondary | ICD-10-CM | POA: Diagnosis present

## 2020-09-30 DIAGNOSIS — R7889 Finding of other specified substances, not normally found in blood: Secondary | ICD-10-CM | POA: Diagnosis not present

## 2020-09-30 DIAGNOSIS — E785 Hyperlipidemia, unspecified: Secondary | ICD-10-CM | POA: Diagnosis not present

## 2020-09-30 DIAGNOSIS — R000 Tachycardia, unspecified: Secondary | ICD-10-CM | POA: Diagnosis not present

## 2020-09-30 DIAGNOSIS — D7581 Myelofibrosis: Secondary | ICD-10-CM | POA: Diagnosis present

## 2020-09-30 DIAGNOSIS — R300 Dysuria: Secondary | ICD-10-CM | POA: Diagnosis not present

## 2020-09-30 DIAGNOSIS — Z8261 Family history of arthritis: Secondary | ICD-10-CM

## 2020-09-30 DIAGNOSIS — Z7951 Long term (current) use of inhaled steroids: Secondary | ICD-10-CM

## 2020-09-30 DIAGNOSIS — G2581 Restless legs syndrome: Secondary | ICD-10-CM | POA: Diagnosis present

## 2020-09-30 DIAGNOSIS — Z8041 Family history of malignant neoplasm of ovary: Secondary | ICD-10-CM

## 2020-09-30 DIAGNOSIS — N179 Acute kidney failure, unspecified: Secondary | ICD-10-CM | POA: Diagnosis present

## 2020-09-30 DIAGNOSIS — U071 COVID-19: Secondary | ICD-10-CM | POA: Diagnosis present

## 2020-09-30 DIAGNOSIS — N390 Urinary tract infection, site not specified: Secondary | ICD-10-CM

## 2020-09-30 DIAGNOSIS — Z8249 Family history of ischemic heart disease and other diseases of the circulatory system: Secondary | ICD-10-CM

## 2020-09-30 DIAGNOSIS — Z811 Family history of alcohol abuse and dependence: Secondary | ICD-10-CM

## 2020-09-30 LAB — CBC WITH DIFFERENTIAL/PLATELET
Abs Immature Granulocytes: 0.01 10*3/uL (ref 0.00–0.07)
Basophils Absolute: 0 10*3/uL (ref 0.0–0.1)
Basophils Relative: 0 %
Eosinophils Absolute: 0.1 10*3/uL (ref 0.0–0.5)
Eosinophils Relative: 2 %
HCT: 20.6 % — ABNORMAL LOW (ref 36.0–46.0)
Hemoglobin: 6.2 g/dL — CL (ref 12.0–15.0)
Immature Granulocytes: 0 %
Lymphocytes Relative: 18 %
Lymphs Abs: 0.6 10*3/uL — ABNORMAL LOW (ref 0.7–4.0)
MCH: 36.9 pg — ABNORMAL HIGH (ref 26.0–34.0)
MCHC: 30.1 g/dL (ref 30.0–36.0)
MCV: 122.6 fL — ABNORMAL HIGH (ref 80.0–100.0)
Monocytes Absolute: 0.2 10*3/uL (ref 0.1–1.0)
Monocytes Relative: 6 %
Neutro Abs: 2.4 10*3/uL (ref 1.7–7.7)
Neutrophils Relative %: 74 %
Platelets: 285 10*3/uL (ref 150–400)
RBC: 1.68 MIL/uL — ABNORMAL LOW (ref 3.87–5.11)
RDW: 27.8 % — ABNORMAL HIGH (ref 11.5–15.5)
WBC: 3.3 10*3/uL — ABNORMAL LOW (ref 4.0–10.5)
nRBC: 0.9 % — ABNORMAL HIGH (ref 0.0–0.2)

## 2020-09-30 LAB — URINALYSIS, ROUTINE W REFLEX MICROSCOPIC
Bilirubin Urine: NEGATIVE
Glucose, UA: NEGATIVE mg/dL
Hgb urine dipstick: NEGATIVE
Ketones, ur: NEGATIVE mg/dL
Nitrite: POSITIVE — AB
Protein, ur: 30 mg/dL — AB
Specific Gravity, Urine: 1.013 (ref 1.005–1.030)
pH: 6 (ref 5.0–8.0)

## 2020-09-30 LAB — RETICULOCYTES
Immature Retic Fract: 29.9 % — ABNORMAL HIGH (ref 2.3–15.9)
RBC.: 1.61 MIL/uL — ABNORMAL LOW (ref 3.87–5.11)
Retic Count, Absolute: 30.8 10*3/uL (ref 19.0–186.0)
Retic Ct Pct: 1.9 % (ref 0.4–3.1)

## 2020-09-30 LAB — BASIC METABOLIC PANEL
Anion gap: 8 (ref 5–15)
BUN: 17 mg/dL (ref 8–23)
CO2: 23 mmol/L (ref 22–32)
Calcium: 8 mg/dL — ABNORMAL LOW (ref 8.9–10.3)
Chloride: 106 mmol/L (ref 98–111)
Creatinine, Ser: 0.55 mg/dL (ref 0.44–1.00)
GFR, Estimated: >60 mL/min (ref >60)
Glucose, Bld: 81 mg/dL (ref 70–99)
Potassium: 3.6 mmol/L (ref 3.5–5.1)
Sodium: 137 mmol/L (ref 135–145)

## 2020-09-30 LAB — FERRITIN: Ferritin: 1060 ng/mL — ABNORMAL HIGH (ref 11–307)

## 2020-09-30 LAB — POC OCCULT BLOOD, ED: Fecal Occult Bld: NEGATIVE

## 2020-09-30 LAB — CBC
HCT: 27.1 % — ABNORMAL LOW (ref 36.0–46.0)
Hemoglobin: 8.7 g/dL — ABNORMAL LOW (ref 12.0–15.0)
MCH: 34.9 pg — ABNORMAL HIGH (ref 26.0–34.0)
MCHC: 32.1 g/dL (ref 30.0–36.0)
MCV: 108.8 fL — ABNORMAL HIGH (ref 80.0–100.0)
Platelets: 189 10*3/uL (ref 150–400)
RBC: 2.49 MIL/uL — ABNORMAL LOW (ref 3.87–5.11)
RDW: 26.1 % — ABNORMAL HIGH (ref 11.5–15.5)
WBC: 4 10*3/uL (ref 4.0–10.5)
nRBC: 0.8 % — ABNORMAL HIGH (ref 0.0–0.2)

## 2020-09-30 LAB — SARS CORONAVIRUS 2 (TAT 6-24 HRS): SARS Coronavirus 2: POSITIVE — AB

## 2020-09-30 LAB — IRON AND TIBC
Iron: 88 ug/dL (ref 28–170)
Saturation Ratios: 58 % — ABNORMAL HIGH (ref 10.4–31.8)
TIBC: 151 ug/dL — ABNORMAL LOW (ref 250–450)
UIBC: 63 ug/dL

## 2020-09-30 LAB — PROTIME-INR
INR: 1 (ref 0.8–1.2)
Prothrombin Time: 12.5 seconds (ref 11.4–15.2)

## 2020-09-30 LAB — PREPARE RBC (CROSSMATCH)

## 2020-09-30 LAB — BRAIN NATRIURETIC PEPTIDE: B Natriuretic Peptide: 105 pg/mL — ABNORMAL HIGH (ref 0.0–100.0)

## 2020-09-30 LAB — VITAMIN B12: Vitamin B-12: 479 pg/mL (ref 180–914)

## 2020-09-30 LAB — FOLATE: Folate: 9.7 ng/mL (ref >5.9)

## 2020-09-30 MED ORDER — PHENAZOPYRIDINE HCL 100 MG PO TABS
200.0000 mg | ORAL_TABLET | Freq: Three times a day (TID) | ORAL | Status: DC
  Administered 2020-09-30 – 2020-10-03 (×8): 200 mg via ORAL
  Filled 2020-09-30 (×9): qty 2

## 2020-09-30 MED ORDER — ACETAMINOPHEN 325 MG PO TABS
650.0000 mg | ORAL_TABLET | Freq: Four times a day (QID) | ORAL | Status: DC | PRN
  Administered 2020-10-01: 650 mg via ORAL
  Filled 2020-09-30: qty 2

## 2020-09-30 MED ORDER — SODIUM CHLORIDE 0.9 % IV SOLN: 10.0000 mL/h | Freq: Once | INTRAVENOUS | Status: DC

## 2020-09-30 MED ORDER — HYDRALAZINE HCL 20 MG/ML IJ SOLN: 10.0000 mg | INTRAMUSCULAR | Status: DC | PRN

## 2020-09-30 MED ORDER — SODIUM CHLORIDE 0.9 % IV SOLN: INTRAVENOUS | Status: DC

## 2020-09-30 MED ORDER — ALUM & MAG HYDROXIDE-SIMETH 200-200-20 MG/5ML PO SUSP: 15.0000 mL | Freq: Four times a day (QID) | ORAL | Status: DC | PRN

## 2020-09-30 MED ORDER — SODIUM CHLORIDE 0.9% FLUSH: 3.0000 mL | INTRAVENOUS | Status: DC | PRN

## 2020-09-30 MED ORDER — OXYCODONE HCL 5 MG PO TABS
5.0000 mg | ORAL_TABLET | ORAL | Status: DC | PRN
  Administered 2020-10-01: 5 mg via ORAL
  Filled 2020-09-30: qty 1

## 2020-09-30 MED ORDER — HYDROMORPHONE HCL 1 MG/ML IJ SOLN: 0.5000 mg | INTRAMUSCULAR | Status: DC | PRN

## 2020-09-30 MED ORDER — OXYCODONE-ACETAMINOPHEN 5-325 MG PO TABS: 2.0000 | ORAL_TABLET | Freq: Three times a day (TID) | ORAL | Status: DC | PRN

## 2020-09-30 MED ORDER — ONDANSETRON HCL 4 MG/2ML IJ SOLN: 4.0000 mg | Freq: Four times a day (QID) | INTRAMUSCULAR | Status: DC | PRN

## 2020-09-30 MED ORDER — THIAMINE HCL 100 MG PO TABS
100.0000 mg | ORAL_TABLET | Freq: Every day | ORAL | Status: DC
  Administered 2020-10-01 – 2020-10-03 (×3): 100 mg via ORAL
  Filled 2020-09-30 (×4): qty 1

## 2020-09-30 MED ORDER — LEVOTHYROXINE SODIUM 25 MCG PO TABS
125.0000 ug | ORAL_TABLET | Freq: Every day | ORAL | Status: DC
  Administered 2020-10-01 – 2020-10-03 (×3): 125 ug via ORAL
  Filled 2020-09-30 (×3): qty 1

## 2020-09-30 MED ORDER — HEPARIN SODIUM (PORCINE) 5000 UNIT/ML IJ SOLN: 5000.0000 [IU] | Freq: Three times a day (TID) | INTRAMUSCULAR | Status: DC

## 2020-09-30 MED ORDER — ESTRADIOL 1 MG PO TABS
0.5000 mg | ORAL_TABLET | Freq: Two times a day (BID) | ORAL | Status: DC
  Administered 2020-09-30 – 2020-10-03 (×6): 0.5 mg via ORAL
  Filled 2020-09-30 (×3): qty 1
  Filled 2020-09-30: qty 0.5
  Filled 2020-09-30: qty 1
  Filled 2020-09-30 (×4): qty 0.5
  Filled 2020-09-30: qty 1
  Filled 2020-09-30 (×2): qty 0.5
  Filled 2020-09-30: qty 1
  Filled 2020-09-30 (×2): qty 0.5

## 2020-09-30 MED ORDER — FAMOTIDINE 20 MG PO TABS
20.0000 mg | ORAL_TABLET | Freq: Two times a day (BID) | ORAL | Status: DC
  Administered 2020-09-30 – 2020-10-03 (×6): 20 mg via ORAL
  Filled 2020-09-30 (×6): qty 1

## 2020-09-30 MED ORDER — SENNOSIDES-DOCUSATE SODIUM 8.6-50 MG PO TABS: 1.0000 | ORAL_TABLET | Freq: Every evening | ORAL | Status: DC | PRN

## 2020-09-30 MED ORDER — CYANOCOBALAMIN 1000 MCG/ML IJ SOLN: 1000.0000 ug | INTRAMUSCULAR | Status: DC

## 2020-09-30 MED ORDER — ACETAMINOPHEN 650 MG RE SUPP: 650.0000 mg | Freq: Four times a day (QID) | RECTAL | Status: DC | PRN

## 2020-09-30 MED ORDER — IPRATROPIUM BROMIDE 0.02 % IN SOLN: 0.5000 mg | Freq: Four times a day (QID) | RESPIRATORY_TRACT | Status: DC | PRN

## 2020-09-30 MED ORDER — SODIUM CHLORIDE 0.9% FLUSH
3.0000 mL | Freq: Two times a day (BID) | INTRAVENOUS | Status: DC
  Administered 2020-09-30 – 2020-10-02 (×5): 3 mL via INTRAVENOUS

## 2020-09-30 MED ORDER — ALPRAZOLAM 1 MG PO TABS
1.0000 mg | ORAL_TABLET | Freq: Three times a day (TID) | ORAL | Status: DC | PRN
  Administered 2020-10-01 – 2020-10-02 (×2): 1 mg via ORAL
  Filled 2020-09-30 (×2): qty 1

## 2020-09-30 MED ORDER — ATORVASTATIN CALCIUM 10 MG PO TABS
10.0000 mg | ORAL_TABLET | Freq: Every day | ORAL | Status: DC
  Administered 2020-10-01 – 2020-10-03 (×3): 10 mg via ORAL
  Filled 2020-09-30 (×3): qty 1

## 2020-09-30 MED ORDER — SODIUM CHLORIDE 0.9 % IV SOLN
1.0000 g | INTRAVENOUS | Status: AC
  Administered 2020-10-01 – 2020-10-02 (×2): 1 g via INTRAVENOUS
  Filled 2020-09-30 (×2): qty 10

## 2020-09-30 MED ORDER — ALUM & MAG HYDROXIDE-SIMETH 200-200-25 MG PO CHEW
1.0000 | CHEWABLE_TABLET | Freq: Three times a day (TID) | ORAL | Status: DC | PRN
  Filled 2020-09-30: qty 1

## 2020-09-30 MED ORDER — BISACODYL 5 MG PO TBEC: 5.0000 mg | DELAYED_RELEASE_TABLET | Freq: Every day | ORAL | Status: DC | PRN

## 2020-09-30 MED ORDER — FLUCONAZOLE 150 MG PO TABS
150.0000 mg | ORAL_TABLET | Freq: Every day | ORAL | Status: DC
  Administered 2020-10-01 – 2020-10-03 (×3): 150 mg via ORAL
  Filled 2020-09-30 (×3): qty 1

## 2020-09-30 MED ORDER — ALBUTEROL SULFATE (2.5 MG/3ML) 0.083% IN NEBU: 2.5000 mg | INHALATION_SOLUTION | RESPIRATORY_TRACT | Status: DC | PRN

## 2020-09-30 MED ORDER — MAGNESIUM CITRATE PO SOLN: 1.0000 | Freq: Once | ORAL | Status: DC | PRN

## 2020-09-30 MED ORDER — SODIUM CHLORIDE 0.9 % IV SOLN: 250.0000 mL | INTRAVENOUS | Status: DC | PRN

## 2020-09-30 MED ORDER — TRAZODONE HCL 50 MG PO TABS
25.0000 mg | ORAL_TABLET | Freq: Every evening | ORAL | Status: DC | PRN
  Administered 2020-10-02: 25 mg via ORAL
  Filled 2020-09-30: qty 1

## 2020-09-30 MED ORDER — ADULT MULTIVITAMIN W/MINERALS CH
1.0000 | ORAL_TABLET | Freq: Every morning | ORAL | Status: DC
  Administered 2020-10-01 – 2020-10-03 (×3): 1 via ORAL
  Filled 2020-09-30 (×3): qty 1

## 2020-09-30 MED ORDER — HYDROXYUREA 500 MG PO CAPS: 1500.0000 mg | ORAL_CAPSULE | Freq: Every day | ORAL | Status: DC

## 2020-09-30 MED ORDER — FLUTICASONE FUROATE-VILANTEROL 100-25 MCG/INH IN AEPB
1.0000 | INHALATION_SPRAY | Freq: Every day | RESPIRATORY_TRACT | Status: DC
  Administered 2020-10-01 – 2020-10-03 (×3): 1 via RESPIRATORY_TRACT
  Filled 2020-09-30: qty 28

## 2020-09-30 MED ORDER — PROSOURCE PLUS PO LIQD
30.0000 mL | Freq: Three times a day (TID) | ORAL | Status: DC
  Administered 2020-09-30 – 2020-10-03 (×8): 30 mL via ORAL
  Filled 2020-09-30 (×10): qty 30

## 2020-09-30 MED ORDER — SODIUM CHLORIDE 0.9% FLUSH
3.0000 mL | Freq: Two times a day (BID) | INTRAVENOUS | Status: DC
  Administered 2020-10-01 – 2020-10-02 (×4): 3 mL via INTRAVENOUS

## 2020-09-30 MED ORDER — ONDANSETRON HCL 4 MG PO TABS: 4.0000 mg | ORAL_TABLET | Freq: Four times a day (QID) | ORAL | Status: DC | PRN

## 2020-09-30 MED ORDER — FOLIC ACID 1 MG PO TABS
500.0000 ug | ORAL_TABLET | Freq: Every day | ORAL | Status: DC
  Administered 2020-10-01 – 2020-10-03 (×3): 0.5 mg via ORAL
  Filled 2020-09-30 (×3): qty 1

## 2020-09-30 MED ORDER — HYDROXYUREA 500 MG PO CAPS: 1000.0000 mg | ORAL_CAPSULE | Freq: Every day | ORAL | Status: DC

## 2020-09-30 NOTE — ED Triage Notes (Signed)
Pt from [facility] for low hgb. Result of 6.4. pt denies any s/s

## 2020-09-30 NOTE — ED Provider Notes (Signed)
[facility] EMERGENCY DEPARTMENT Provider Note   CSN: [id number] Arrival date & time: 09/30/20  1049     History Chief Complaint  Patient presents with  . Abnormal Lab    Rachael Johnson is a 70 y.o. female.  She is a resident at [facility].  She had routine blood work done there and her hemoglobin was low at 6.4.  Patient endorses some tiredness but no chest pain abdominal pain shortness of breath.  Has not seen any bleeding.  Has required transfusions in the past.   Abnormal Lab Time since result:  Hours Patient referred by:  Nursing home Resulting agency:  External Result type: hematology   Hematology:    Hematocrit:  Low   Hemoglobin:  Low Weakness Severity:  Moderate Onset quality:  Gradual Timing:  Intermittent Progression:  Unchanged Chronicity:  New Relieved by:  Nothing Worsened by:  Activity Ineffective treatments:  None tried Associated symptoms: no abdominal pain, no chest pain, no cough, no diarrhea, no dysuria, no fever, no melena, no shortness of breath and no vomiting   Risk factors: anemia        Past Medical History:  Diagnosis Date  . Acute renal insufficiency 04/09/2017  . Anemia, iron deficiency 07/08/2012  . Arthritis   . Cataract 12/21/2014  . COPD (chronic obstructive pulmonary disease) ([place]) 01/23/2017   01/23/2017   Walked RA  2 laps @ 185 ft each stopped due to  Cough > > sob/ no desat nl pace  . Depression with anxiety 03/24/2011  . Diverticulitis   . Esophageal reflux 08/17/2013  . Essential thrombocythemia ([place]) 01/24/2011  . Frequent episodic tension-type headache   . Gastroenteritis 12/21/2014  . 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 GERD (gastroesophageal reflux disease)   . H. pylori infection 12/19/2012  . Hyperglycemia 12/17/2015  . Hypertension   . Iron deficiency anemia due to chronic blood loss 04/03/2017  . Osteoarthritis 05/24/2017  . Other and unspecified hyperlipidemia 02/25/2013  . Restless leg syndrome 09/30/2014  . Rhabdomyolysis 02/2017   . Scabies 03/28/2015  . 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 Seizure ([place])    childhood  . Shoulder wound, right, sequela 03/14/2017  . Thyroid disease     Patient Active Problem List   Diagnosis Date Noted  . Severe protein-calorie malnutrition ([place])   . Adult failure to thrive   . Weakness   . Acute encephalopathy   . Palliative care by specialist   . Osteomyelitis ([place]) 04/17/2020  . Pressure injury of sacral region, stage 4 ([place]) 04/17/2020  . Decubitus ulcer of left heel, unstageable ([place]) 04/17/2020  . Pernicious anemia 10/31/2019  . Hypokalemia 10/16/2019  . Insomnia 02/05/2019  . Urinary frequency 06/19/2018  . 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 Sinusitis 11/05/2017  . Chronic back pain 09/09/2017  . Osteoarthritis 05/24/2017  . Iron deficiency anemia due to chronic blood loss 04/03/2017  . Hypomagnesemia 03/18/2017  . Right shoulder pain 03/14/2017  . LFTs abnormal 03/13/2017  . Hypernatremia 03/13/2017  . Elevated LFTs   . COPD (chronic obstructive pulmonary disease) ([place]) 01/23/2017  . Lung nodule 12/18/2016  . Bronchiectasis assoc with MPNs 11/28/2016  . Cough variant asthma vs UACS 11/27/2016  . Vitamin D deficiency 03/07/2016  . Dysuria 03/07/2016  . Essential hypertension 03/07/2016  . Benign paroxysmal positional vertigo 03/07/2016  . Hyperglycemia 12/17/2015  . Cataract 12/21/2014  . Restless leg syndrome 09/30/2014  . Allergies 04/27/2014  . Constipation 11/24/2013  . Goals of care, counseling/discussion 04/20/2013  .  Hyperlipidemia 02/25/2013  . H. pylori infection 12/19/2012  . Umbilical hernia 02/77/4128  . Macrocytic anemia 07/08/2012  . Weight loss 12/29/2011  . Depression with anxiety 03/24/2011  . 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 Hypothyroid 02/06/2011  . Thrombocythemia 01/24/2011    Past Surgical History:  Procedure Laterality Date  . ABDOMINAL HYSTERECTOMY     1992  . BREAST BIOPSY    . BREAST CYST ASPIRATION    .  BREAST SURGERY  1992   biopsy, benign. Fibrocystic  . UMBILICAL HERNIA REPAIR N/A 09/25/2012   Procedure: HERNIA REPAIR UMBILICAL ADULT;  Surgeon: Harl Bowie, MD;  Location: [facility] ORS;  Service: General;  Laterality: N/A;     OB History    Gravida  3   Para  2   Term      Preterm      AB  1   Living  2     SAB  1   IAB      Ectopic      Multiple      Live Births              Family History  Problem Relation Age of Onset  . Arthritis Mother   . Cancer Mother        ovarian  . Hypertension Mother   . Heart disease Mother        pacer  . Heart failure Mother   . Arthritis Father   . Cancer Father        lung  . Hypertension Father   . Cancer Sister        lung  . Cancer Brother        prostate  . Cancer Brother        lung  . Hypertension Son   . COPD Brother   . Heart disease Brother        died from CHF  . Hypertension Brother   . Cancer Brother        colon  . Alcohol abuse Brother   . Cirrhosis Brother   . Seizures Sister   . Stroke Sister   . Arthritis Brother     Social History   Tobacco Use  . Smoking status: Former Smoker    Packs/day: 3.50    Years: 20.00    Pack years: 70.00    Types: Cigarettes    Start date: 11/15/1970    Quit date: 07/31/1990    Years since quitting: 30.1  . Smokeless tobacco: Never Used  . Tobacco comment: quit 23 years ago  Vaping Use  . Vaping Use: Never used  Substance Use Topics  . Alcohol use: No    Alcohol/week: 0.0 standard drinks  . Drug use: No    Home Medications Prior to Admission medications   Medication Sig Start Date End Date Taking? Authorizing Provider  acetaminophen (TYLENOL) 325 MG tablet Take 2 tablets (650 mg total) by mouth every 6 (six) hours as needed for mild pain or headache (or Fever >/= 101). Patient not taking: No sig reported 04/21/20   Barton Dubois, MD  albuterol (VENTOLIN HFA) 108 (90 Base) MCG/ACT inhaler INHALE 2 PUFFS BY MOUTH EVERY 6 HOURS AS NEEDED FOR  WHEEZING OR SHORTNESS OF BREATH 06/09/19   Mosie Lukes, MD  ALPRAZolam Duanne Moron) 0.25 MG tablet Take 1-2 tablets (0.25-0.5 mg total) by mouth every 8 (eight) hours as needed for anxiety. Patient not taking: No sig reported 04/21/20   Barton Dubois, MD  ALPRAZolam (XANAX) 1 MG tablet TAKE 1 TABLET BY MOUTH THREE TIMES DAILY AS NEEDED FOR ANXIETY 07/16/20   Mosie Lukes, MD  alum & mag hydroxide-simeth (GELUSIL) 200-200-25 MG chewable tablet Chew by mouth. 04/08/19   [provider]  atorvastatin (LIPITOR) 10 MG tablet Take 1 tablet (10 mg total) by mouth daily. 05/18/20   Mosie Lukes, MD  B-D 3CC LUER-LOK SYR 25GX1" 25G X 1" 3 ML MISC Inject into the skin. Patient not taking: No sig reported 02/23/20   [provider]  B-D 3CC LUER-LOK SYR 25GX1" 25G X 1" 3 ML MISC INJECT 1 ML SUBCUTANEOUSLY ONCE A WEEK THEN INJECT 1 ML ONCE EVERY MONTH Patient not taking: No sig reported 06/15/20   [name], MD  budesonide-formoterol (SYMBICORT) 80-4.5 MCG/ACT inhaler Inhale 2 puffs into the lungs 2 (two) times daily. 06/09/19   Mosie Lukes, MD  busPIRone (BUSPAR) 15 MG tablet Take 1 tablet (15 mg total) by mouth 4 (four) times daily as needed. Patient not taking: No sig reported 05/18/20   Mosie Lukes, MD  cephALEXin (KEFLEX) 500 MG capsule Take 1 capsule (500 mg total) by mouth 4 (four) times daily. 09/09/20   Milton Ferguson, MD  chlorpheniramine (ALLER-CHLOR) 4 MG tablet Take 2 tablets (8 mg total) by mouth at bedtime as needed for allergies or rhinitis. Patient not taking: No sig reported 09/08/20   Mosie Lukes, MD  collagenase (SANTYL) ointment Apply topically 2 (two) times daily. Apply twice a day to sacral decubitus ulcer. 06/29/20   Mosie Lukes, MD  cyanocobalamin (,VITAMIN B-12,) 1000 MCG/ML injection Inject 1 ml into muscle q week x 4 then q monthly Patient taking differently: Inject 1,000 mcg into the muscle every 30 (thirty) days. 10/31/19   [name], MD   cyclobenzaprine (FLEXERIL) 10 MG tablet Take 1 tablet (10 mg total) by mouth 2 (two) times daily as needed for muscle spasms. Patient not taking: No sig reported 08/13/20   Mosie Lukes, MD  diphenhydrAMINE (EQ ALLERGY RELIEF CHILDRENS) 12.5 MG/5ML liquid TAKE 5 ML BY MOUTH THREE TIMES DAILY AS NEEDED FOR MOUTH  PAIN Patient not taking: No sig reported 10/10/19   Mosie Lukes, MD  enoxaparin (LOVENOX) 40 MG/0.4ML injection Inject 40 mg into the skin daily.    [provider]  estradiol (ESTRACE) 0.5 MG tablet Take 1 tablet (0.5 mg total) by mouth 2 (two) times daily. 06/14/20   Mosie Lukes, MD  famotidine (PEPCID) 20 MG tablet Take 20 mg by mouth 2 (two) times daily. 03/04/20   [provider]  fluticasone (FLONASE) 50 MCG/ACT nasal spray INSTILL TWO (2) SPRAYS IN EACH NOSTRIL ONCE DAILY 08/21/19   Mosie Lukes, MD  Fluticasone-Salmeterol (ADVAIR) 250-50 MCG/DOSE AEPB Inhale 1 puff into the lungs 2 (two) times daily.    [provider]  folic acid (FOLVITE) 106 MCG tablet Take 400 mcg by mouth daily.    [provider]  furosemide (LASIX) 20 MG tablet TAKE 1 TABLET BY MOUTH THREE TIMES DAILY AS NEEDED FOR EDEMA Patient not taking: No sig reported 08/16/20   Mosie Lukes, MD  gabapentin (NEURONTIN) 100 MG capsule TAKE FOUR (4) CAPSULES BY MOUTH EVERY NIGHT AT BEDTIME Patient not taking: No sig reported 08/16/20   Mosie Lukes, MD  hydroxyurea (HYDREA) 500 MG capsule Take 1,000 mg by mouth daily. May take with food to minimize GI side effects.    [provider]  hydroxyurea (HYDREA) 500 MG capsule Take 1,500 mg by mouth at bedtime. May take with food to minimize GI side effects.    [provider]  ibuprofen (ADVIL) 400 MG tablet Take 400 mg by mouth every 8 (eight) hours as needed for mild pain.    [provider]  ipratropium-albuterol (DUONEB) 0.5-2.5 (3) MG/3ML SOLN Take 3 mLs by nebulization every 4 (four) hours as  needed.    [provider]  levothyroxine (SYNTHROID) 100 MCG tablet TAKE 1 TABLET BY MOUTH EVERY MORNING BEFORE BREAKFAST Patient not taking: No sig reported 01/12/20   Mosie Lukes, MD  levothyroxine (SYNTHROID) 125 MCG tablet Take 125 mcg by mouth daily before breakfast.    [provider]  linaclotide (LINZESS) 145 MCG CAPS capsule Take 1 capsule (145 mcg total) by mouth daily before breakfast. Patient not taking: No sig reported 04/22/20   Barton Dubois, MD  magic mouthwash SOLN Take 5 mLs by mouth 3 (three) times daily as needed for mouth pain. Components benadryl  525 mg, hydrocortisone 60 mg and nystatin 0.6 mg. 240 ml - Oral Patient not taking: No sig reported 04/08/19   [name], MD  meclizine (ANTIVERT) 12.5 MG tablet TAKE 1 TABLET BY MOUTH THREE TIMES DAILY AS NEEDED FOR DIZZINESS Patient not taking: No sig reported 08/16/20   Mosie Lukes, MD  montelukast (SINGULAIR) 10 MG tablet Take 1 tablet (10 mg total) by mouth at bedtime. 06/14/20   Mosie Lukes, MD  Multiple Vitamin (MULTIVITAMIN PO) Take 1 tablet by mouth every morning.    [provider]  Nutritional Supplements (ENSURE ACTIVE HIGH PROTEIN) LIQD Take 1 bottle TID between meals. Patient not taking: No sig reported 04/21/20   Barton Dubois, MD  nystatin (MYCOSTATIN) 100000 UNIT/ML suspension Take 5 mLs (500,000 Units total) by mouth 4 (four) times daily as needed (thrush). Patient not taking: No sig reported 04/21/20   Barton Dubois, MD  ondansetron [facility]) 8 MG tablet Take 0.5-1 tablets (4-8 mg total) by mouth every 8 (eight) hours as needed for nausea or vomiting. 09/20/20   Mosie Lukes, MD  oxyCODONE-acetaminophen (PERCOCET) 10-325 MG tablet Take 1 tablet by mouth every 8 (eight) hours as needed for pain (severe pain). 09/13/20   Mosie Lukes, MD  PEGASYS 180 MCG/ML injection INJECT 0.25 MLS (45 MCG TOTAL) INTO THE SKIN EVERY 7 (SEVEN) DAYS. SINGLE USE VIALS, AFTER INJECTION  DISPOSE OF REMAINING VIAL CONTENT. Patient not taking: No sig reported 06/07/20   [name], MD  polyethylene glycol (MIRALAX / GLYCOLAX) 17 g packet Take 17 g by mouth daily as needed for mild constipation. Patient not taking: No sig reported 04/21/20   Barton Dubois, MD  potassium chloride SA (KLOR-CON) 20 MEQ tablet Take 1 tablet (20 mEq total) by mouth daily. Patient not taking: No sig reported 04/21/20   Barton Dubois, MD  RESTASIS 0.05 % ophthalmic emulsion PLACE ONE DROP INTO EACH EYE EVERY 12 HOURS Patient not taking: No sig reported 06/09/19   Mosie Lukes, MD  rivaroxaban (XARELTO) 10 MG TABS tablet Take 1 tablet (10 mg total) by mouth daily. Patient not taking: No sig reported 03/18/20   [name], MD  SANCUSO 3.1 MG/24HR APPLY 1 PATCH TOPICALLY AS NEEDED FOR NAUSEA, REMOVE AFTER 7 DAYS Patient not taking: No sig reported 08/16/20   [name], MD  sulfamethoxazole-trimethoprim (BACTRIM DS) 800-160 MG tablet Take 1 tablet by mouth 2 (two) times daily. Patient not  taking: No sig reported 06/29/20   Saguier, Percell Miller, PA-C  SYRINGE-NEEDLE, DISP, 3 ML (B-D 3CC LUER-LOK SYR 25GX1") 25G X 1" 3 ML MISC 1 Syringe by Subdermal route once a week. Patient not taking: No sig reported 12/02/19   [provider]  thiamine 100 MG tablet Take 100 mg by mouth daily.    [provider]  traZODone (DESYREL) 100 MG tablet TAKE 1 TABLET BY MOUTH AT BEDTIME Patient not taking: No sig reported 08/16/20   Mosie Lukes, MD  TUBERCULIN SYR 1CC/27GX1/2" (B-D TB SYRINGE 1CC/27GX1/2") 27G X 1/2" 1 ML MISC USE AS DIRECTED ONCE WEEKLY FOR PEG-INTERFERON INJECTIONS Patient not taking: No sig reported 09/09/19   [name], MD  venlafaxine XR (EFFEXOR-XR) 75 MG 24 hr capsule TAKE 1 CAPSULE BY MOUTH THREE TIMES DAILY Patient not taking: No sig reported 08/16/20   Mosie Lukes, MD    Allergies    Patient has no known allergies.  Review of Systems   Review of Systems   Constitutional: Negative for fever.  HENT: Negative for sore throat.   Eyes: Negative for visual disturbance.  Respiratory: Negative for cough and shortness of breath.   Cardiovascular: Negative for chest pain.  Gastrointestinal: Negative for abdominal pain, diarrhea, melena and vomiting.  Genitourinary: Negative for dysuria.  Musculoskeletal: Negative for neck pain.  Skin: Negative for rash.  Neurological: Positive for weakness.    Physical Exam Updated Vital Signs BP 107/66 (BP Location: Right Arm)   Pulse (!) 107   Temp 99.2 F (37.3 C) (Oral)   Resp 18   Ht 5\' 4"  (1.626 m)   Wt 53.1 kg   LMP 07/31/1990   SpO2 96%   BMI 20.08 kg/m   Physical Exam Vitals and nursing note reviewed.  Constitutional:      General: She is not in acute distress.    Appearance: Normal appearance. She is well-developed and well-nourished.  HENT:     Head: Normocephalic and atraumatic.  Eyes:     Conjunctiva/sclera: Conjunctivae normal.  Cardiovascular:     Rate and Rhythm: Regular rhythm. Tachycardia present.     Heart sounds: No murmur heard.   Pulmonary:     Effort: Pulmonary effort is normal. No respiratory distress.     Breath sounds: Normal breath sounds.  Abdominal:     Palpations: Abdomen is soft.     Tenderness: There is no abdominal tenderness.  Musculoskeletal:        General: No deformity, signs of injury or edema. Normal range of motion.     Cervical back: Neck supple.  Skin:    General: Skin is warm and dry.  Neurological:     Mental Status: She is alert. Mental status is at baseline.  Psychiatric:        Mood and Affect: Mood and affect normal.     ED Results / Procedures / Treatments   Labs (all labs ordered are listed, but only abnormal results are displayed) Labs Reviewed  BASIC METABOLIC PANEL - Abnormal; Notable for the following components:      Result Value   Calcium 8.0 (*)    All other components within normal limits  CBC WITH DIFFERENTIAL/PLATELET  - Abnormal; Notable for the following components:   WBC 3.3 (*)    RBC 1.68 (*)    Hemoglobin 6.2 (*)    HCT 20.6 (*)    MCV 122.6 (*)    MCH 36.9 (*)    RDW 27.8 (*)  nRBC 0.9 (*)    Lymphs Abs 0.6 (*)    All other components within normal limits  IRON AND TIBC - Abnormal; Notable for the following components:   TIBC 151 (*)    Saturation Ratios 58 (*)    All other components within normal limits  FERRITIN - Abnormal; Notable for the following components:   Ferritin 1,060 (*)    All other components within normal limits  RETICULOCYTES - Abnormal; Notable for the following components:   RBC. 1.61 (*)    Immature Retic Fract 29.9 (*)    All other components within normal limits  BRAIN NATRIURETIC PEPTIDE - Abnormal; Notable for the following components:   B Natriuretic Peptide 105.0 (*)    All other components within normal limits  SARS CORONAVIRUS 2 (TAT 6-24 HRS)  URINE CULTURE  VITAMIN B12  FOLATE  PROTIME-INR  URINALYSIS, ROUTINE W REFLEX MICROSCOPIC  CBC  BASIC METABOLIC PANEL  CBC  PROTIME-INR  POC OCCULT BLOOD, ED  TYPE AND SCREEN  PREPARE RBC (CROSSMATCH)    EKG EKG Interpretation  Date/Time:  Thursday September 30 2020 11:36:34 EST Ventricular Rate:  100 PR Interval:    QRS Duration: 82 QT Interval:  346 QTC Calculation: 447 R Axis:   41 Text Interpretation: Sinus tachycardia Low voltage, extremity leads No significant change since prior 2/22 Confirmed by Aletta Edouard [phone]) on 09/30/2020 11:39:00 AM   Radiology No results found.  Procedures .Critical Care Performed by: Hayden Rasmussen, MD Authorized by: Hayden Rasmussen, MD   Critical care provider statement:    Critical care time (minutes):  45   Critical care time was exclusive of:  Separately billable procedures and treating other patients   Critical care was necessary to treat or prevent imminent or life-threatening deterioration of the following conditions:  Circulatory failure    Critical care was time spent personally by me on the following activities:  Discussions with consultants, evaluation of patient's response to treatment, examination of patient, ordering and performing treatments and interventions, ordering and review of laboratory studies, ordering and review of radiographic studies, pulse oximetry, re-evaluation of patient's condition, obtaining history from patient or surrogate, review of old charts and development of treatment plan with patient or surrogate     Medications Ordered in ED Medications  0.9 %  sodium chloride infusion (has no administration in time range)  sodium chloride flush (NS) 0.9 % injection 3 mL (has no administration in time range)  sodium chloride flush (NS) 0.9 % injection 3 mL (has no administration in time range)  sodium chloride flush (NS) 0.9 % injection 3 mL (has no administration in time range)  0.9 %  sodium chloride infusion (has no administration in time range)  acetaminophen (TYLENOL) tablet 650 mg (has no administration in time range)    Or  acetaminophen (TYLENOL) suppository 650 mg (has no administration in time range)  oxyCODONE (Oxy IR/ROXICODONE) immediate release tablet 5 mg (has no administration in time range)  HYDROmorphone (DILAUDID) injection 0.5-1 mg (has no administration in time range)  traZODone (DESYREL) tablet 25 mg (has no administration in time range)  senna-docusate (Senokot-S) tablet 1 tablet (has no administration in time range)  bisacodyl (DULCOLAX) EC tablet 5 mg (has no administration in time range)  magnesium citrate solution 1 Bottle (has no administration in time range)  ondansetron (ZOFRAN) tablet 4 mg (has no administration in time range)    Or  ondansetron (ZOFRAN) injection 4 mg (has no administration in time range)  ipratropium (ATROVENT) nebulizer solution 0.5 mg (has no administration in time range)  hydrALAZINE (APRESOLINE) injection 10 mg (has no administration in time range)   albuterol (PROVENTIL) (2.5 MG/3ML) 0.083% nebulizer solution 2.5 mg (has no administration in time range)  atorvastatin (LIPITOR) tablet 10 mg (has no administration in time range)  ALPRAZolam (XANAX) tablet 1 mg (has no administration in time range)  estradiol (ESTRACE) tablet 0.5 mg (has no administration in time range)  levothyroxine (SYNTHROID) tablet 125 mcg (has no administration in time range)  famotidine (PEPCID) tablet 20 mg (has no administration in time range)  cyanocobalamin ((VITAMIN B-12)) injection 1,000 mcg (has no administration in time range)  folic acid (FOLVITE) tablet 0.5 mg (has no administration in time range)  multivitamin with minerals tablet 1 tablet (has no administration in time range)  fluticasone furoate-vilanterol (BREO ELLIPTA) 100-25 MCG/INH 1 puff (1 puff Inhalation Not Given 09/30/20 1649)  cefTRIAXone (ROCEPHIN) 1 g in sodium chloride 0.9 % 100 mL IVPB (has no administration in time range)  alum & mag hydroxide-simeth (MAALOX/MYLANTA) 200-200-20 MG/5ML suspension 15 mL (has no administration in time range)  fluconazole (DIFLUCAN) tablet 150 mg (has no administration in time range)  phenazopyridine (PYRIDIUM) tablet 200 mg (200 mg Oral Given 09/30/20 1744)  (feeding supplement) PROSource Plus liquid 30 mL (30 mLs Oral Given 09/30/20 1744)  thiamine tablet 100 mg (has no administration in time range)    ED Course  I have reviewed the triage vital signs and the nursing notes.  Pertinent labs & imaging results that were available during my care of the patient were reviewed by me and considered in my medical decision making (see chart for details).  Clinical Course as of 09/30/20 2034  Thu Sep 30, 2020  1134 EKG not currently crossing, interpreted by me as sinus tachycardia rate of 110 normal intervals no acute ST-T changes. [MB]  5631 Rectal exam done with tech as chaperone.  Brown soft stool.  No masses.  Sample sent to lab for guaiac [MB]  1326 Discussed with  [facility] hospitalist Dr. Roger Shelter who will evaluate the patient for admission. [MB]    Clinical Course User Index [MB] Hayden Rasmussen, MD   MDM Rules/Calculators/A&P                         This patient complains of abnormal lab, generalized weakness fatigue; this involves an extensive number of treatment Options and is a complaint that carries with it a high risk of complications and Morbidity. The differential includes anemia, failure to thrive, dehydration, renal failure, metabolic derangement ACS, Covid  I ordered, reviewed and interpreted labs, which included CBC with low white count noted prior lower hemoglobin baseline 6.2 normal platelets, normal chemistries, normal INR I ordered medication transfusion of 2 units of blood  Additional history obtained from EMS Previous records obtained and reviewed in epic no recent admissions I consulted Dr. Roger Shelter and discussed lab and imaging findings  Critical Interventions: Transfusion of blood products for acute anemia and symptomatic  After the interventions stated above, I reevaluated the patient and found patient to be hemodynamically stable. To be admitted for transfusion of blood products.  No evidence of acute GI bleed.   Final Clinical Impression(s) / ED Diagnoses Final diagnoses:  Symptomatic anemia    Rx / DC Orders ED Discharge Orders    None       Hayden Rasmussen, MD 09/30/20 2040

## 2020-09-30 NOTE — H&P (Addendum)
History and Physical   Patient: Rachael Johnson                            PCP: Mosie Lukes, MD                    DOB: 1951-05-15            DOA: 09/30/2020 NIO:[id number]             DOS: 09/30/2020, 1:27 PM  Patient coming from:   HOME  I have personally reviewed patient's medical records, in electronic medical records, including:  [place], and care everywhere.    Chief Complaint:   Chief Complaint  Patient presents with  . Abnormal Lab    History of present illness:    Rachael Johnson is a 70 y.o. female with medical history significant of multiple comorbidities (COPD not O2 dependent, AKI, anemia of chronic disease-iron deficiency folate deficiency , HTN, HLD, depression, anxiety, GERD, essential thrombocytopenia, OA, RA, RLS, ) including recent hospitalizations for pneumonia, failure to thrive, weight loss, according to son borderline leukemia follows with oncologist at [facility] Dr. Marin Johnson (has had 3 blood transfusion in past 3 years). Presenting from SNF with a hemoglobin of 6.2.. Also complained of dysuria, burning and pain sensation in vaginal area... Denies have any discharge or odor symptoms been going on for 2-3 days.  Patient was diagnosed with SARS-CoV-2 3 weeks ago with quarantine, not hospitalized, not require oxygen- Per patient's son she is fully vaccinated (not sure if she is boosted)   Patient Denies having: Fever, Chills, Cough, SOB, Chest Pain, Abd pain, N/V/D, headache, dizziness, lightheadedness, Joint pain, rash, open wounds  ED Course:   Vitals: Within normal limits Abnormal labs; CBC WBC 3.3, hemoglobin 6.2, hematocrit 20.6, platelet 285,  BMP within normal meds Ferritin 1060, TIBC 151, iron 88   Review of Systems: As per HPI, otherwise 10 point review of systems were negative.   ----------------------------------------------------------------------------------------------------------------------  No Known Allergies  Home MEDs:  Prior  to Admission medications   Medication Sig Start Date End Date Taking? Authorizing Provider  acetaminophen (TYLENOL) 325 MG tablet Take 2 tablets (650 mg total) by mouth every 6 (six) hours as needed for mild pain or headache (or Fever >/= 101). Patient not taking: No sig reported 04/21/20   Barton Dubois, MD  albuterol (VENTOLIN HFA) 108 (90 Base) MCG/ACT inhaler INHALE 2 PUFFS BY MOUTH EVERY 6 HOURS AS NEEDED FOR WHEEZING OR SHORTNESS OF BREATH 06/09/19   Mosie Lukes, MD  ALPRAZolam Duanne Moron) 0.25 MG tablet Take 1-2 tablets (0.25-0.5 mg total) by mouth every 8 (eight) hours as needed for anxiety. Patient not taking: No sig reported 04/21/20   Barton Dubois, MD  ALPRAZolam Duanne Moron) 1 MG tablet TAKE 1 TABLET BY MOUTH THREE TIMES DAILY AS NEEDED FOR ANXIETY 07/16/20   Mosie Lukes, MD  alum & mag hydroxide-simeth (GELUSIL) 200-200-25 MG chewable tablet Chew by mouth. 04/08/19   [provider]  atorvastatin (LIPITOR) 10 MG tablet Take 1 tablet (10 mg total) by mouth daily. 05/18/20   Mosie Lukes, MD  budesonide-formoterol (SYMBICORT) 80-4.5 MCG/ACT inhaler Inhale 2 puffs into the lungs 2 (two) times daily. 06/09/19   Mosie Lukes, MD  busPIRone (BUSPAR) 15 MG tablet Take 1 tablet (15 mg total) by mouth 4 (four) times daily as needed. Patient not taking: No sig reported 05/18/20   Charlett Blake,  Bonnita Levan, MD  cephALEXin (KEFLEX) 500 MG capsule Take 1 capsule (500 mg total) by mouth 4 (four) times daily. 09/09/20   Milton Ferguson, MD  collagenase (SANTYL) ointment Apply topically 2 (two) times daily. Apply twice a day to sacral decubitus ulcer. 06/29/20   Mosie Lukes, MD  cyanocobalamin (,VITAMIN B-12,) 1000 MCG/ML injection Inject 1 ml into muscle q week x 4 then q monthly Patient taking differently: Inject 1,000 mcg into the muscle every 30 (thirty) days. 10/31/19   [name], MD  enoxaparin (LOVENOX) 40 MG/0.4ML injection Inject 40 mg into the skin daily.    [provider]   estradiol (ESTRACE) 0.5 MG tablet Take 1 tablet (0.5 mg total) by mouth 2 (two) times daily. 06/14/20   Mosie Lukes, MD  famotidine (PEPCID) 20 MG tablet Take 20 mg by mouth 2 (two) times daily. 03/04/20   [provider]  fluticasone (FLONASE) 50 MCG/ACT nasal spray INSTILL TWO (2) SPRAYS IN EACH NOSTRIL ONCE DAILY 08/21/19   Mosie Lukes, MD  Fluticasone-Salmeterol (ADVAIR) 250-50 MCG/DOSE AEPB Inhale 1 puff into the lungs 2 (two) times daily.    [provider]  folic acid (FOLVITE) 811 MCG tablet Take 400 mcg by mouth daily.    [provider]  furosemide (LASIX) 20 MG tablet TAKE 1 TABLET BY MOUTH THREE TIMES DAILY AS NEEDED FOR EDEMA Patient not taking: No sig reported 08/16/20   Mosie Lukes, MD  hydroxyurea (HYDREA) 500 MG capsule Take 1,000 mg by mouth daily. May take with food to minimize GI side effects.    [provider]  hydroxyurea (HYDREA) 500 MG capsule Take 1,500 mg by mouth at bedtime. May take with food to minimize GI side effects.    [provider]  ibuprofen (ADVIL) 400 MG tablet Take 400 mg by mouth every 8 (eight) hours as needed for mild pain.    [provider]  ipratropium-albuterol (DUONEB) 0.5-2.5 (3) MG/3ML SOLN Take 3 mLs by nebulization every 4 (four) hours as needed.    [provider]  levothyroxine (SYNTHROID) 100 MCG tablet TAKE 1 TABLET BY MOUTH EVERY MORNING BEFORE BREAKFAST Patient not taking: No sig reported 01/12/20   Mosie Lukes, MD  levothyroxine (SYNTHROID) 125 MCG tablet Take 125 mcg by mouth daily before breakfast.    [provider]  linaclotide (LINZESS) 145 MCG CAPS capsule Take 1 capsule (145 mcg total) by mouth daily before breakfast. Patient not taking: No sig reported 04/22/20   Barton Dubois, MD  montelukast (SINGULAIR) 10 MG tablet Take 1 tablet (10 mg total) by mouth at bedtime. 06/14/20   Mosie Lukes, MD  Multiple Vitamin (MULTIVITAMIN PO) Take 1 tablet by  mouth every morning.    [provider]  ondansetron (ZOFRAN) 8 MG tablet Take 0.5-1 tablets (4-8 mg total) by mouth every 8 (eight) hours as needed for nausea or vomiting. 09/20/20   Mosie Lukes, MD  oxyCODONE-acetaminophen (PERCOCET) 10-325 MG tablet Take 1 tablet by mouth every 8 (eight) hours as needed for pain (severe pain). 09/13/20   Mosie Lukes, MD  PEGASYS 180 MCG/ML injection INJECT 0.25 MLS (45 MCG TOTAL) INTO THE SKIN EVERY 7 (SEVEN) DAYS. SINGLE USE VIALS, AFTER INJECTION DISPOSE OF REMAINING VIAL CONTENT. Patient not taking: No sig reported 06/07/20   [name], MD  rivaroxaban (XARELTO) 10 MG TABS tablet Take 1 tablet (10 mg total) by mouth daily. Patient not taking: No sig reported 03/18/20  [name], MD  sulfamethoxazole-trimethoprim (BACTRIM DS) 800-160 MG tablet Take 1 tablet by mouth 2 (two) times daily. Patient not taking: No sig reported 06/29/20   Saguier, Percell Miller, PA-C  thiamine 100 MG tablet Take 100 mg by mouth daily.    [provider]  traZODone (DESYREL) 100 MG tablet TAKE 1 TABLET BY MOUTH AT BEDTIME Patient not taking: No sig reported 08/16/20   Mosie Lukes, MD  venlafaxine XR (EFFEXOR-XR) 75 MG 24 hr capsule TAKE 1 CAPSULE BY MOUTH THREE TIMES DAILY Patient not taking: No sig reported 08/16/20   Mosie Lukes, MD    PRN MEDs: sodium chloride, acetaminophen **OR** acetaminophen, albuterol, ALPRAZolam, alum & mag hydroxide-simeth, bisacodyl, hydrALAZINE, HYDROmorphone (DILAUDID) injection, ipratropium, magnesium citrate, ondansetron **OR** ondansetron (ZOFRAN) IV, oxyCODONE, oxyCODONE-acetaminophen, senna-docusate, sodium chloride flush, traZODone  Past Medical History:  Diagnosis Date  . Acute renal insufficiency 04/09/2017  . Anemia, iron deficiency 07/08/2012  . Arthritis   . Cataract 12/21/2014  . COPD (chronic obstructive pulmonary disease) ([place]) 01/23/2017   01/23/2017   Walked RA  2 laps @ 185 ft each stopped due to   Cough > > sob/ no desat nl pace  . Depression with anxiety 03/24/2011  . Diverticulitis   . Esophageal reflux 08/17/2013  . Essential thrombocythemia ([place]) 01/24/2011  . Frequent episodic tension-type headache   . Gastroenteritis 12/21/2014  . 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 GERD (gastroesophageal reflux disease)   . H. pylori infection 12/19/2012  . Hyperglycemia 12/17/2015  . Hypertension   . Iron deficiency anemia due to chronic blood loss 04/03/2017  . Osteoarthritis 05/24/2017  . Other and unspecified hyperlipidemia 02/25/2013  . Restless leg syndrome 09/30/2014  . Rhabdomyolysis 02/2017  . Scabies 03/28/2015  . 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 Seizure ([place])    childhood  . Shoulder wound, right, sequela 03/14/2017  . Thyroid disease     Past Surgical History:  Procedure Laterality Date  . ABDOMINAL HYSTERECTOMY     1992  . BREAST BIOPSY    . BREAST CYST ASPIRATION    . BREAST SURGERY  1992   biopsy, benign. Fibrocystic  . UMBILICAL HERNIA REPAIR N/A 09/25/2012   Procedure: HERNIA REPAIR UMBILICAL ADULT;  Surgeon: Harl Bowie, MD;  Location: [facility];  Service: General;  Laterality: N/A;     reports that she quit smoking about 30 years ago. Her smoking use included cigarettes. She started smoking about 49 years ago. She has a 70.00 pack-year smoking history. She has never used smokeless tobacco. She reports that she does not drink alcohol and does not use drugs.   Family History  Problem Relation Age of Onset  . Arthritis Mother   . Cancer Mother        ovarian  . Hypertension Mother   . Heart disease Mother        pacer  . Heart failure Mother   . Arthritis Father   . Cancer Father        lung  . Hypertension Father   . Cancer Sister        lung  . Cancer Brother        prostate  . Cancer Brother        lung  . Hypertension Son   . COPD Brother   . Heart disease Brother        died from CHF  . Hypertension Brother   . Cancer Brother        colon  .  Alcohol abuse Brother   . Cirrhosis Brother   . Seizures Sister   . Stroke Sister   . Arthritis Brother     Physical Exam:   Vitals:   09/30/20 1054 09/30/20 1120 09/30/20 1130 09/30/20 1200  BP: 107/66 115/74 108/67 115/69  Pulse: (!) 107   97  Resp: 18 10 12 11   Temp: 99.2 F (37.3 C)     TempSrc: Oral     SpO2: 96%   98%  Weight: 53.1 kg     Height: 5\' 4"  (1.626 m)      Constitutional: NAD, calm, comfortable Eyes: PERRL, lids and conjunctivae normal ENMT: Mucous membranes are moist. Posterior pharynx clear of any exudate or lesions.Normal dentition.  Neck: normal, supple, no masses, no thyromegaly Respiratory: clear to auscultation bilaterally, no wheezing, no crackles. Normal respiratory effort. No accessory muscle use.  Cardiovascular: Regular rate and rhythm, no murmurs / rubs / gallops. No extremity edema. 2+ pedal pulses. No carotid bruits.  Abdomen: no tenderness, no masses palpated. No hepatosplenomegaly. Bowel sounds positive.  Musculoskeletal: no clubbing / cyanosis. No joint deformity upper and lower extremities. Good ROM, no contractures. Normal muscle tone.  Neurologic: CN II-XII grossly intact. Sensation intact, DTR normal. Strength 5/5 in all 4.  Psychiatric: Normal judgment and insight. Alert and oriented x 3. Normal mood.  Skin: no rashes, lesions, ulcers. No induration Decubitus/ulcers:  Wounds: per nursing documentation  Pressure Injury 04/17/20 Sacrum Mid Stage 4 - Full thickness tissue loss with exposed bone, tendon or muscle. Large open wound with slough (Active)  04/17/20 0155  Location: Sacrum  Location Orientation: Mid  Staging: Stage 4 - Full thickness tissue loss with exposed bone, tendon or muscle.  Wound Description (Comments): Large open wound with slough  Present on Admission: Yes     Pressure Injury 04/17/20 Heel Left Deep Tissue Pressure Injury - Purple or maroon localized area of discolored intact skin or blood-filled blister due to  damage of underlying soft tissue from pressure and/or shear. large purple intact wound (Active)  04/17/20 0156  Location: Heel  Location Orientation: Left  Staging: Deep Tissue Pressure Injury - Purple or maroon localized area of discolored intact skin or blood-filled blister due to damage of underlying soft tissue from pressure and/or shear.  Wound Description (Comments): large purple intact wound  Present on Admission: Yes     Labs on admission:    I have personally reviewed following labs and imaging studies  CBC: Recent Labs  Lab 09/30/20 1115  WBC 3.3*  NEUTROABS 2.4  HGB 6.2*  HCT 20.6*  MCV 122.6*  PLT 703   Basic Metabolic Panel: Recent Labs  Lab 09/30/20 1115  NA 137  K 3.6  CL 106  CO2 23  GLUCOSE 81  BUN 17  CREATININE 0.55  CALCIUM 8.0*  Coagulation Profile: Recent Labs  Lab 09/30/20 1211  INR 1.0    Recent Labs    09/30/20 1141  VITAMINB12 479  FOLATE 9.7  FERRITIN 1,060*  TIBC 151*  IRON 88  RETICCTPCT 1.9   Urine analysis:    Component Value Date/Time   COLORURINE YELLOW 09/09/2020 0631   APPEARANCEUR HAZY (A) 09/09/2020 0631   LABSPEC 1.018 09/09/2020 0631   PHURINE 5.0 09/09/2020 0631   GLUCOSEU NEGATIVE 09/09/2020 0631   GLUCOSEU NEGATIVE 06/17/2018 1529   HGBUR MODERATE (A) 09/09/2020 0631   BILIRUBINUR NEGATIVE 09/09/2020 0631   BILIRUBINUR neg 03/20/2014 1008   KETONESUR NEGATIVE 09/09/2020 0631   PROTEINUR 100 (A)  09/09/2020 0631   UROBILINOGEN 0.2 06/17/2018 1529   NITRITE NEGATIVE 09/09/2020 0631   LEUKOCYTESUR LARGE (A) 09/09/2020 0631     Radiologic Exams on Admission:   No results found.  EKG:   Independently reviewed.  Orders placed or performed during the hospital encounter of 09/30/20  . ED EKG  . ED EKG  . EKG 12-Lead  . EKG 12-Lead   *Note: Due to a large number of results and/or encounters for the requested time period, some results have not been displayed. A complete set of results can be found  in Results Review.   ---------------------------------------------------------------------------------------------------------------------------------------    Assessment / Plan:   Principal Problem:   Anemia Active Problems:   Dysuria   Acute lower UTI   Hypothyroid   Depression with anxiety   Macrocytic anemia   Hyperlipidemia   Essential hypertension   COPD (chronic obstructive pulmonary disease) (HCC)    Principal Problem:   Anemia-acute on chronic anemia Symptomatic anemia -History of chronic anemia due to iron deficiency B12 deficiency on supplements no signs of bleeding -Per patient son has borderline leukemia follows with oncologist Dr.Ennever She has had blood transfusion in the past 2 to 3 years, last blood transfusion last month. -Hemoglobin 6.2 >>> 2 units of PRBC blood transfusion has been ordered through ED -Iron studies reviewed: Total iron normal at 88, TIBC elevated 151, ferritin 1060, -Folate normal at 9.7, B12 normal at 479 (patient is on supplemental folate, B12 shots) -Hemoccult negative -No active signs of bleeding  Dysuria -possible acute cystitis-released infection -We will order urine analysis, cultures -Physical exam did not reveal any fishy odor or discharge -Patient will be given Diflucan, Pyridium -Empiric antibiotics of Rocephin---   SARS-CoV-2 positive -Patient had a recent history of SARS-CoV-2 infection 3 weeks ago, also vaccinated -Currently remains asymptomatic with no respiratory distress or upper respiratory symptoms -Withholding treatments at this point -Inhaler oxygen supplement if needed -Currently satting 98% on room air   Active Problems: History of recent hospitalizations with pneumonia -currently stable in no respite distress, afebrile normotensive   Hypothyroid -continue Synthroid   Depression with anxiety -continue as needed Xanax   Macrocytic anemia -as above needing transfusion   Hyperlipidemia -continue statins    Essential hypertension -currently stable, with holding home medication of l Lasix   COPD (chronic obstructive pulmonary disease) (HCC) -continue home inhalers as needed nebs History of myelofibrosis - h/o ? leukemia (per son) thrombocytopenia, leukopenia, anemia -  -outpatient follow-up with Dr. Marin Johnson -Records indicate previously patient was on Xarelto - at SNF subcu Lovenox for DVT prophylaxis given the history of myelofibrosis -will withhold Lovenox for now given her severe anemia  Lovenox for DVT prophylaxis in the setting of myelofibrosis -on hold    Decubitus/ulcers:  Wounds: per nursing documentation Pressure Injury 04/17/20 Sacrum Mid Stage 4 - Full thickness tissue loss with exposed bone, tendon or muscle. Large open wound with slough (Active)  04/17/20 0155  Location: Sacrum  Location Orientation: Mid  Staging: Stage 4 - Full thickness tissue loss with exposed bone, tendon or muscle.  Wound Description (Comments): Large open wound with slough  Present on Admission: Yes     Pressure Injury 04/17/20 Heel Left Deep Tissue Pressure Injury - Purple or maroon localized area of discolored intact skin or blood-filled blister due to damage of underlying soft tissue from pressure and/or shear. large purple intact wound (Active)  04/17/20 0156  Location: Heel  Location Orientation: Left  Staging: Deep Tissue Pressure Injury -  Purple or maroon localized area of discolored intact skin or blood-filled blister due to damage of underlying soft tissue from pressure and/or shear.  Wound Description (Comments): large purple intact wound  Present on Admission: Yes       Cultures:  -Urine cultures Antimicrobial: -IV Rocephin  Consults called:  None --------------------------------------------------------------------------------------------------------------------------------------- DVT prophylaxis: SCD/Compression stockings Code Status:   Code Status: Full Code   Admission status:  Patient will be admitted as Inpatient, with a greater than 2 midnight length of stay. Level of care: Med-Surg   Family Communication:  none at bedside -discussed with her son Mr. Sonda Rumble over the phone (The above findings and plan of care has been discussed with patient in detail, the patient expressed understanding and agreement of above plan)  --------------------------------------------------------------------------------------------------------------------------------------------------  Disposition Plan:  Anticipated 1-2 days Status is: Inpatient  Remains inpatient appropriate because:Hemodynamically unstable and Inpatient level of care appropriate due to severity of illness   Dispo: The patient is from: SNF              Anticipated d/c is to: SNF              Patient currently is not medically stable to d/c.   Difficult to place patient No ----------------------------------------------------------------------------------------------------------------------------------------------------  Time spent: > than  55  Min.   SIGNED: Deatra James, MD, FHM. [facility],  Pager (Please use [email or web address] to page to text)  If 7PM-7AM, please contact night-coverage [email or web address],  09/30/2020, 1:27 PM

## 2020-09-30 NOTE — ED Notes (Signed)
ED TO INPATIENT HANDOFF REPORT  ED Nurse Name and Phone #: [phone]  S Name/Age/Gender Rachael Johnson 70 y.o. female Room/Bed: APA14/APA14  Code Status   Code Status: Full Code  Home/SNF/Other Nursing Home Patient oriented to: self, place, time and situation Is this baseline? Yes   Triage Complete: Triage complete  Chief Complaint Anemia [D64.9]  Triage Note Pt from [facility] for low hgb. Result of 6.4. pt denies any s/s    Allergies No Known Allergies  Level of Care/Admitting Diagnosis ED Disposition    ED Disposition Condition [facility] Area: [facility] [[id number]]  Level of Care: Med-Surg [16]  Covid Evaluation: Asymptomatic Screening Protocol (No Symptoms)  Diagnosis: Anemia [094709]  Admitting Physician: Acquanetta Sit  Attending Physician: Acquanetta Sit  Estimated length of stay: 3 - 4 days  Certification:: I certify this patient will need inpatient services for at least 2 midnights       B Medical/Surgery History Past Medical History:  Diagnosis Date  . Acute renal insufficiency 04/09/2017  . Anemia, iron deficiency 07/08/2012  . Arthritis   . Cataract 12/21/2014  . COPD (chronic obstructive pulmonary disease) ([place]) 01/23/2017   01/23/2017   Walked RA  2 laps @ 185 ft each stopped due to  Cough > > sob/ no desat nl pace  . Depression with anxiety 03/24/2011  . Diverticulitis   . Esophageal reflux 08/17/2013  . Essential thrombocythemia ([place]) 01/24/2011  . Frequent episodic tension-type headache   . Gastroenteritis 12/21/2014  . 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 GERD (gastroesophageal reflux disease)   . H. pylori infection 12/19/2012  . Hyperglycemia 12/17/2015  . Hypertension   . Iron deficiency anemia due to chronic blood loss 04/03/2017  . Osteoarthritis 05/24/2017  . Other and unspecified hyperlipidemia 02/25/2013  . Restless leg syndrome 09/30/2014  . Rhabdomyolysis 02/2017  . Scabies 03/28/2015  .  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 Seizure ([place])    childhood  . Shoulder wound, right, sequela 03/14/2017  . Thyroid disease    Past Surgical History:  Procedure Laterality Date  . ABDOMINAL HYSTERECTOMY     1992  . BREAST BIOPSY    . BREAST CYST ASPIRATION    . BREAST SURGERY  1992   biopsy, benign. Fibrocystic  . UMBILICAL HERNIA REPAIR N/A 09/25/2012   Procedure: HERNIA REPAIR UMBILICAL ADULT;  Surgeon: Harl Bowie, MD;  Location: [facility];  Service: General;  Laterality: N/A;     A IV Location/Drains/Wounds Patient Lines/Drains/Airways Status    Active Line/Drains/Airways    Name Placement date Placement time Site Days   Peripheral IV 09/30/20 Left Other (Comment) 09/30/20  1121  Other (Comment)  less than 1   PICC Single Lumen 04/21/20 PICC Right Brachial 38 cm 2 cm 04/21/20  1853  Brachial  162   External Urinary Catheter 09/30/20  1259  --  less than 1   Pressure Injury 04/17/20 Sacrum Mid Stage 4 - Full thickness tissue loss with exposed bone, tendon or muscle. Large open wound with slough 04/17/20  0155  -- 166   Pressure Injury 04/17/20 Heel Left Deep Tissue Pressure Injury - Purple or maroon localized area of discolored intact skin or blood-filled blister due to damage of underlying soft tissue from pressure and/or shear. large purple intact wound 04/17/20  0156  -- 166          Intake/Output Last 24 hours No intake or output data in the  24 hours ending 09/30/20 1453  Labs/Imaging Results for orders placed or performed during the hospital encounter of 09/30/20 (from the past 48 hour(s))  Basic metabolic panel     Status: Abnormal   Collection Time: 09/30/20 11:15 AM  Result Value Ref Range   Sodium 137 135 - 145 mmol/L   Potassium 3.6 3.5 - 5.1 mmol/L   Chloride 106 98 - 111 mmol/L   CO2 23 22 - 32 mmol/L   Glucose, Bld 81 70 - 99 mg/dL    Comment: Glucose reference range applies only to samples taken after fasting for at least 8 hours.   BUN 17 8  - 23 mg/dL   Creatinine, Ser 0.55 0.44 - 1.00 mg/dL   Calcium 8.0 (L) 8.9 - 10.3 mg/dL   GFR, Estimated >60 >60 mL/min    Comment: (NOTE) Calculated using the CKD-EPI Creatinine Equation (2021)    Anion gap 8 5 - 15    Comment: Performed at [facility], [address], [place], [place] [address]  CBC with Differential     Status: Abnormal   Collection Time: 09/30/20 11:15 AM  Result Value Ref Range   WBC 3.3 (L) 4.0 - 10.5 K/uL   RBC 1.68 (L) 3.87 - 5.11 MIL/uL   Hemoglobin 6.2 (LL) 12.0 - 15.0 g/dL    Comment: This critical result has verified and been called to Micca Matura,N RN by Charlie Pitter on 03 03 2022 at 1221, and has been read back.    HCT 20.6 (L) 36.0 - 46.0 %   MCV 122.6 (H) 80.0 - 100.0 fL   MCH 36.9 (H) 26.0 - 34.0 pg   MCHC 30.1 30.0 - 36.0 g/dL   RDW 27.8 (H) 11.5 - 15.5 %   Platelets 285 150 - 400 K/uL   nRBC 0.9 (H) 0.0 - 0.2 %   Neutrophils Relative % 74 %   Neutro Abs 2.4 1.7 - 7.7 K/uL   Lymphocytes Relative 18 %   Lymphs Abs 0.6 (L) 0.7 - 4.0 K/uL   Monocytes Relative 6 %   Monocytes Absolute 0.2 0.1 - 1.0 K/uL   Eosinophils Relative 2 %   Eosinophils Absolute 0.1 0.0 - 0.5 K/uL   Basophils Relative 0 %   Basophils Absolute 0.0 0.0 - 0.1 K/uL   Immature Granulocytes 0 %   Abs Immature Granulocytes 0.01 0.00 - 0.07 K/uL    Comment: Performed at [facility], [address], [place], [place] [address]  Type and screen [facility]     Status: None (Preliminary result)   Collection Time: 09/30/20 11:15 AM  Result Value Ref Range   ABO/RH(D) O POS    Antibody Screen NEG    Sample Expiration      10/03/2020,2359 Performed at [facility], [address], [place], [place] [address]    Unit Number [id number]    Blood Component Type RED CELLS,LR    Unit division 00    Status of Unit ALLOCATED    Transfusion Status OK TO TRANSFUSE    Crossmatch Result Compatible    Unit Number [id number]    Blood Component Type RED CELLS,LR    Unit division 00     Status of Unit ALLOCATED    Transfusion Status OK TO TRANSFUSE    Crossmatch Result Compatible   Prepare RBC (crossmatch)     Status: None   Collection Time: 09/30/20 11:15 AM  Result Value Ref Range   Order Confirmation      ORDER  PROCESSED BY BLOOD BANK Performed at [facility], [address], [place], [place] [address]   Vitamin B12     Status: None   Collection Time: 09/30/20 11:41 AM  Result Value Ref Range   Vitamin B-12 479 180 - 914 pg/mL    Comment: (NOTE) This assay is not validated for testing neonatal or myeloproliferative syndrome specimens for Vitamin B12 levels. Performed at [facility], [address] [place], [place] [address]   Folate     Status: None   Collection Time: 09/30/20 11:41 AM  Result Value Ref Range   Folate 9.7 >5.9 ng/mL    Comment: Performed at [facility], [address] [place], [place] [address]  Iron and TIBC     Status: Abnormal   Collection Time: 09/30/20 11:41 AM  Result Value Ref Range   Iron 88 28 - 170 ug/dL   TIBC 151 (L) 250 - 450 ug/dL   Saturation Ratios 58 (H) 10.4 - 31.8 %   UIBC 63 ug/dL    Comment: Performed at [facility], [address], [place], [place] [address]  Ferritin     Status: Abnormal   Collection Time: 09/30/20 11:41 AM  Result Value Ref Range   Ferritin 1,060 (H) 11 - 307 ng/mL    Comment: Performed at [facility], [address] [place], [place] [address]  Reticulocytes     Status: Abnormal   Collection Time: 09/30/20 11:41 AM  Result Value Ref Range   Retic Ct Pct 1.9 0.4 - 3.1 %   RBC. 1.61 (L) 3.87 - 5.11 MIL/uL   Retic Count, Absolute 30.8 19.0 - 186.0 K/uL   Immature Retic Fract 29.9 (H) 2.3 - 15.9 %    Comment: Performed at [facility], [address], [place], [place] [address]  Protime-INR     Status: None   Collection Time: 09/30/20 12:11 PM  Result Value Ref Range   Prothrombin Time 12.5 11.4 - 15.2 seconds   INR 1.0 0.8 - 1.2    Comment: (NOTE) INR goal varies based on device and  disease states. Performed at [facility], [address], [place], [place] [address]   POC occult blood, ED     Status: None   Collection Time: 09/30/20 12:49 PM  Result Value Ref Range   Fecal Occult Bld NEGATIVE NEGATIVE   *Note: Due to a large number of results and/or encounters for the requested time period, some results have not been displayed. A complete set of results can be found in Results Review.   No results found.  Pending Labs Unresulted Labs (From admission, onward)          Start     Ordered   10/01/20 7124  Basic metabolic panel  Daily,   R      09/30/20 1312   10/01/20 0500  CBC  Daily,   R      09/30/20 1312   10/01/20 0500  Protime-INR  Tomorrow morning,   R        09/30/20 1312   09/30/20 1418  Urinalysis, Routine w reflex microscopic  ONCE - STAT,   STAT        09/30/20 1417   09/30/20 1322  Culture, Urine  Once,   STAT        09/30/20 1322   09/30/20 1311  Brain natriuretic peptide  Once,   STAT        09/30/20 1312   09/30/20 1223  SARS CORONAVIRUS 2 (TAT  6-24 HRS) Nasopharyngeal Nasopharyngeal Swab  (Tier 3 - Symptomatic/asymptomatic with Precautions)  Once,   STAT       Question Answer Comment  Is this test for diagnosis or screening Screening   Symptomatic for COVID-19 as defined by CDC No   Hospitalized for COVID-19 No   Admitted to ICU for COVID-19 No   Previously tested for COVID-19 Yes   Resident in a congregate (group) care setting Yes   Employed in healthcare setting No   Pregnant No   Has patient completed COVID vaccination(s) (2 doses of Pfizer/Moderna 1 dose of [employer]) Yes   Has patient completed COVID Booster / 3rd dose Unknown      09/30/20 1223          Vitals/Pain Today's Vitals   09/30/20 1054 09/30/20 1120 09/30/20 1130 09/30/20 1200  BP: 107/66 115/74 108/67 115/69  Pulse: (!) 107   97  Resp: 18 10 12 11   Temp: 99.2 F (37.3 C)     TempSrc: Oral     SpO2: 96%   98%  Weight: 53.1 kg     Height: 5\' 4"  (1.626  m)     PainSc: 0-No pain       Isolation Precautions Airborne and Contact precautions  Medications Medications  0.9 %  sodium chloride infusion (has no administration in time range)  sodium chloride flush (NS) 0.9 % injection 3 mL (has no administration in time range)  sodium chloride flush (NS) 0.9 % injection 3 mL (has no administration in time range)  sodium chloride flush (NS) 0.9 % injection 3 mL (has no administration in time range)  0.9 %  sodium chloride infusion (has no administration in time range)  acetaminophen (TYLENOL) tablet 650 mg (has no administration in time range)    Or  acetaminophen (TYLENOL) suppository 650 mg (has no administration in time range)  oxyCODONE (Oxy IR/ROXICODONE) immediate release tablet 5 mg (has no administration in time range)  HYDROmorphone (DILAUDID) injection 0.5-1 mg (has no administration in time range)  traZODone (DESYREL) tablet 25 mg (has no administration in time range)  senna-docusate (Senokot-S) tablet 1 tablet (has no administration in time range)  bisacodyl (DULCOLAX) EC tablet 5 mg (has no administration in time range)  magnesium citrate solution 1 Bottle (has no administration in time range)  ondansetron (ZOFRAN) tablet 4 mg (has no administration in time range)    Or  ondansetron (ZOFRAN) injection 4 mg (has no administration in time range)  ipratropium (ATROVENT) nebulizer solution 0.5 mg (has no administration in time range)  hydrALAZINE (APRESOLINE) injection 10 mg (has no administration in time range)  albuterol (PROVENTIL) (2.5 MG/3ML) 0.083% nebulizer solution 2.5 mg (has no administration in time range)  oxyCODONE-acetaminophen (PERCOCET/ROXICET) 5-325 MG per tablet 2 tablet (has no administration in time range)  atorvastatin (LIPITOR) tablet 10 mg (has no administration in time range)  ALPRAZolam (XANAX) tablet 1 mg (has no administration in time range)  estradiol (ESTRACE) tablet 0.5 mg (has no administration in time  range)  levothyroxine (SYNTHROID) tablet 125 mcg (has no administration in time range)  famotidine (PEPCID) tablet 20 mg (has no administration in time range)  cyanocobalamin ((VITAMIN B-12)) injection 1,000 mcg (has no administration in time range)  folic acid (FOLVITE) tablet 0.5 mg (has no administration in time range)  multivitamin with minerals tablet 1 tablet (has no administration in time range)  fluticasone furoate-vilanterol (BREO ELLIPTA) 100-25 MCG/INH 1 puff (has no administration in time range)  cefTRIAXone (  ROCEPHIN) 1 g in sodium chloride 0.9 % 100 mL IVPB (has no administration in time range)  alum & mag hydroxide-simeth (MAALOX/MYLANTA) 200-200-20 MG/5ML suspension 15 mL (has no administration in time range)  fluconazole (DIFLUCAN) tablet 150 mg (has no administration in time range)  phenazopyridine (PYRIDIUM) tablet 200 mg (has no administration in time range)  (feeding supplement) PROSource Plus liquid 30 mL (has no administration in time range)  thiamine tablet 100 mg (has no administration in time range)    Mobility walks with device Low fall risk   Focused Assessments    R Recommendations: See Admitting Provider Note  Report given to:   Additional Notes:  '

## 2020-10-01 DIAGNOSIS — D649 Anemia, unspecified: Secondary | ICD-10-CM | POA: Diagnosis not present

## 2020-10-01 DIAGNOSIS — E440 Moderate protein-calorie malnutrition: Secondary | ICD-10-CM | POA: Insufficient documentation

## 2020-10-01 DIAGNOSIS — F418 Other specified anxiety disorders: Secondary | ICD-10-CM | POA: Diagnosis not present

## 2020-10-01 DIAGNOSIS — D539 Nutritional anemia, unspecified: Secondary | ICD-10-CM

## 2020-10-01 DIAGNOSIS — J449 Chronic obstructive pulmonary disease, unspecified: Secondary | ICD-10-CM

## 2020-10-01 DIAGNOSIS — L8945 Pressure ulcer of contiguous site of back, buttock and hip, unstageable: Secondary | ICD-10-CM

## 2020-10-01 DIAGNOSIS — N390 Urinary tract infection, site not specified: Secondary | ICD-10-CM | POA: Diagnosis not present

## 2020-10-01 LAB — CBC
HCT: 27.9 % — ABNORMAL LOW (ref 36.0–46.0)
Hemoglobin: 9 g/dL — ABNORMAL LOW (ref 12.0–15.0)
MCH: 34.6 pg — ABNORMAL HIGH (ref 26.0–34.0)
MCHC: 32.3 g/dL (ref 30.0–36.0)
MCV: 107.3 fL — ABNORMAL HIGH (ref 80.0–100.0)
Platelets: 189 10*3/uL (ref 150–400)
RBC: 2.6 MIL/uL — ABNORMAL LOW (ref 3.87–5.11)
RDW: 25.3 % — ABNORMAL HIGH (ref 11.5–15.5)
WBC: 4.4 10*3/uL (ref 4.0–10.5)
nRBC: 0.9 % — ABNORMAL HIGH (ref 0.0–0.2)

## 2020-10-01 LAB — BPAM RBC
Blood Product Expiration Date: 202203302359
Blood Product Expiration Date: 202203302359
ISSUE DATE / TIME: 202203031600
ISSUE DATE / TIME: 202203032037
Unit Type and Rh: 5100
Unit Type and Rh: 5100

## 2020-10-01 LAB — PROTIME-INR
INR: 1.1 (ref 0.8–1.2)
Prothrombin Time: 13.7 seconds (ref 11.4–15.2)

## 2020-10-01 LAB — TYPE AND SCREEN
ABO/RH(D): O POS
Antibody Screen: NEGATIVE
Unit division: 0
Unit division: 0

## 2020-10-01 LAB — BASIC METABOLIC PANEL
Anion gap: 7 (ref 5–15)
BUN: 15 mg/dL (ref 8–23)
CO2: 23 mmol/L (ref 22–32)
Calcium: 8.1 mg/dL — ABNORMAL LOW (ref 8.9–10.3)
Chloride: 108 mmol/L (ref 98–111)
Creatinine, Ser: 0.4 mg/dL — ABNORMAL LOW (ref 0.44–1.00)
GFR, Estimated: >60 mL/min (ref >60)
Glucose, Bld: 98 mg/dL (ref 70–99)
Potassium: 3.6 mmol/L (ref 3.5–5.1)
Sodium: 138 mmol/L (ref 135–145)

## 2020-10-01 LAB — GLUCOSE, CAPILLARY
Glucose-Capillary: 84 mg/dL (ref 70–99)
Glucose-Capillary: 115 mg/dL — ABNORMAL HIGH (ref 70–99)
Glucose-Capillary: 135 mg/dL — ABNORMAL HIGH (ref 70–99)

## 2020-10-01 MED ORDER — COLLAGENASE 250 UNIT/GM EX OINT
TOPICAL_OINTMENT | Freq: Every day | CUTANEOUS | Status: DC
  Filled 2020-10-01: qty 30

## 2020-10-01 MED ORDER — CHLORHEXIDINE GLUCONATE CLOTH 2 % EX PADS
6.0000 | MEDICATED_PAD | Freq: Every day | CUTANEOUS | Status: DC
  Administered 2020-10-03: 6 via TOPICAL

## 2020-10-01 MED ORDER — JUVEN PO PACK
1.0000 | PACK | Freq: Two times a day (BID) | ORAL | Status: DC
  Administered 2020-10-01 – 2020-10-03 (×5): 1 via ORAL
  Filled 2020-10-01 (×5): qty 1

## 2020-10-01 MED ORDER — HYDROXYUREA 500 MG PO CAPS
500.0000 mg | ORAL_CAPSULE | Freq: Every day | ORAL | Status: DC
  Administered 2020-10-01 – 2020-10-02 (×2): 500 mg via ORAL
  Filled 2020-10-01 (×2): qty 1

## 2020-10-01 NOTE — Progress Notes (Signed)
Initial Nutrition Assessment  DOCUMENTATION CODES:   Non-severe (moderate) malnutrition in context of chronic illness  INTERVENTION:  -1 packet Juven BID,-between meals.  Each packet provides 95 calories, 2.5 grams of protein (collagen), and 9.8 grams of carbohydrate (3 grams sugar); also contains 7 grams of L-arginine and L-glutamine, 300 mg vitamin C, 15 mg vitamin E, 1.2 mcg vitamin B-12, 9.5 mg zinc, 200 mg calcium, and 1.5 g  Calcium Beta-hydroxy-Beta-methylbutyrate to support wound healing.   -ProSource Plus 30 ml TID   -Chocolate milk with all meals  -Recommend consider medication for appetite if agreeable with MD   NUTRITION DIAGNOSIS:   Moderate Malnutrition related to chronic illness,poor appetite (Failure to thrive, CVA, boderline leukemia) as evidenced by per patient/family report,energy intake < or equal to 75% for > or equal to 1 month,moderate fat depletion,moderate muscle depletion,mild fat depletion.   GOAL:  Patient will meet greater than or equal to 90% of their needs  MONITOR:  PO intake,Supplement acceptance,Labs,Skin,Weight trends  REASON FOR ASSESSMENT:   Consult Assessment of nutrition requirement/status  ASSESSMENT: Patient is a 70 yo female from SNF with a history of CVA, COPD, GERD, FTT, unplanned wt loss, HTN, anemia of chronic disease (folate and iron), borderline leukemia. Patient was COVID positive 2-3 weeks ago and has been asymptomatic.   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 WOC consulted.   Patient reports appetite is fair. Patient complains that she has been experiencing burning with urination for 3 weeks prior to admission. Difficult to re-direct her toward nutrition history. She is able to feed herself and eat a regular diet per her recall. Food preferences- chicken, beef, pork, vegetables. Also likes chocolate milk. Encouraged both protein and energy dense foods due to increased needs.  She is agreeable to take a protein modular and JUVEN to promote wound healing. Patient says she was taking something for appetite at previous facility.  Weight currently 56.9 kg. Loss of 5.9 kg/ 9% x 6 months, and 15% compared to wt 11 months ago. Usual weight 63-67 kg. Actual weight loss skewed due to her upper and lower extremity edema. Patient is malnourished.   Labs reviewed:  BMP Latest Ref Rng & Units 10/01/2020 09/30/2020 09/09/2020  Glucose 70 - 99 mg/dL 98 81 96  BUN 8 - 23 mg/dL 15 17 19   Creatinine 0.44 - 1.00 mg/dL 0.40(L) 0.55 0.57  Sodium 135 - 145 mmol/L 138 137 135  Potassium 3.5 - 5.1 mmol/L 3.6 3.6 3.9  Chloride 98 - 111 mmol/L 108 106 107  CO2 22 - 32 mmol/L 23 23 25   Calcium 8.9 - 10.3 mg/dL 8.1(L) 8.0(L) 8.9    Medications: Vitamin B-12, PEPCID, Lipitor, thiamine, MVI, Folvite, Pyridium  NUTRITION - FOCUSED PHYSICAL EXAM:  Flowsheet Row Most Recent Value  Orbital Region Mild depletion  Upper Arm Region Severe depletion  Thoracic and Lumbar Region Moderate depletion  Temple Region Mild depletion  Clavicle Bone Region Moderate depletion  Clavicle and Acromion Bone Region Moderate depletion  Dorsal Hand Unable to assess  [bilateral edema both hands and forearms. Patient says it happened overnight]  Patellar Region Unable to assess  [c/o knee pain. encouraged her to inform her nurse]  Edema (RD Assessment) Moderate  [pateient says the past 3 weeks increased lower extremity edema. she has on ted hose]  Hair Reviewed  Eyes Reviewed  Mouth Reviewed  Skin Reviewed  [wounds]  Nails Reviewed     Diet  Order:   Diet Order            Diet regular Room service appropriate? Yes; Fluid consistency: Thin  Diet effective now                 EDUCATION NEEDS:  None identified at present  Skin:  Skin Assessment: Reviewed RN Assessment.  Pitting edema upper and lower extremities per nursing report.   Last BM:  3/4 type 5  Height:   Ht Readings from Last 1 Encounters:   09/30/20 5\' 4"  (1.626 m)    Weight:   Wt Readings from Last 1 Encounters:  10/01/20 56.9 kg    Ideal Body Weight:   55 kg  BMI:  Body mass index is 21.53 kg/m.  Estimated Nutritional Needs:   Kcal:  1900-2000  Protein:  80-85  Fluid:  >1800 ml daily   Colman Cater MS,RD,CSG,LDN Pager: Shea Evans

## 2020-10-01 NOTE — Evaluation (Signed)
Occupational Therapy Evaluation Patient Details Name: Rachael Johnson MRN: [id number] DOB: 22-Dec-1950 Today's Date: 10/01/2020    History of Present Illness Rachael Johnson is a 70 y.o. female with medical history significant of multiple comorbidities (COPD not O2 dependent, AKI, anemia of chronic disease-iron deficiency folate deficiency , HTN, HLD, depression, anxiety, GERD, essential thrombocytopenia, OA, RA, RLS, ) including recent hospitalizations for pneumonia, failure to thrive, weight loss, according to son borderline leukemia follows with oncologist at [facility] Dr. Marin Johnson (has had 3 blood transfusion in past 3 years).  Presenting from SNF with a hemoglobin of 6.2.. Also complained of dysuria, burning and pain sensation in vaginal area... Denies have any discharge or odor symptoms been going on for 2-3 days.     Patient was diagnosed with SARS-CoV-2 3 weeks ago with quarantine, not hospitalized, not require oxygen-  Per patient's son she is fully vaccinated (not sure if she is boosted)      Patient Denies having: Fever, Chills, Cough, SOB, Chest Pain, Abd pain, N/V/D, headache, dizziness, lightheadedness, Joint pain, rash, open wounds   Clinical Impression   Pt agreeable to OT evaluation. Pt demonstrated bed mobility supine to sit with minimal assist. Pt was able to transfer from EOB to toilet and back using RW with minimal assist from PT. Pt able to don socks with moderate to maximal assist at bed level. Pt c/o pain in gluteal region from previous sores. Pt would benefit from continued acute OT services to increase functional strength and endurance to improve over ADL independence.     Follow Up Recommendations  SNF;Supervision/Assistance - 24 hour    Equipment Recommendations  None recommended by OT           Precautions / Restrictions Precautions Precautions: Fall Restrictions Weight Bearing Restrictions: No      Mobility Bed Mobility Overal bed mobility: Needs  Assistance Bed Mobility: Supine to Sit     Supine to sit: Min assist     General bed mobility comments: Hand held assist.    Transfers Overall transfer level: Needs assistance Equipment used: Rolling walker (2 wheeled) Transfers: Sit to/from [facility] (ambulatory) Sit to Stand: Min guard;Min assist Stand pivot transfers: Min assist       General transfer comment: slow labored movement; unsteady    Balance Overall balance assessment: Needs assistance Sitting-balance support: Feet supported;No upper extremity supported Sitting balance-Leahy Scale: Good Sitting balance - Comments: EOB   Standing balance support: Bilateral upper extremity supported;During functional activity (support via RW) Standing balance-Leahy Scale: Fair Standing balance comment: functional transfer                           ADL either performed or assessed with clinical judgement   ADL Overall ADL's : Needs assistance/impaired     Grooming: Wash/dry face;Set up;Sitting Grooming Details (indicate cue type and reason): s/u assist to wash face seated in chair.             Lower Body Dressing: Moderate assistance;Maximal assistance;Bed level Lower Body Dressing Details (indicate cue type and reason): Moderate to maximal assist to don socks at bed level. Toilet Transfer: Minimal Insurance claims handler Details (indicate cue type and reason): Ambualtory transfer from EOB to toilet and back with min A from PT. Toileting- Clothing Manipulation and Hygiene: Modified independent Toileting - Clothing Manipulation Details (indicate cue type and reason): Able to lift up gown and manage toileting hygiene without assist.  Vision Baseline Vision/History: No visual deficits                  Pertinent Vitals/Pain Pain Assessment: Faces Faces Pain Scale: Hurts little more Pain Location: R LE from previous stroke. Gluteal regioin from previous  sores. Pain Descriptors / Indicators: Grimacing Pain Intervention(s): Monitored during session     Hand Dominance Right   Extremity/Trunk Assessment Upper Extremity Assessment Upper Extremity Assessment: Generalized weakness   Lower Extremity Assessment Lower Extremity Assessment: Defer to PT evaluation   Cervical / Trunk Assessment Cervical / Trunk Assessment: Kyphotic   Communication Communication Communication: No difficulties   Cognition Arousal/Alertness: Awake/alert Behavior During Therapy: WFL for tasks assessed/performed Overall Cognitive Status: Within Functional Limits for tasks assessed                                                      Home Living Family/patient expects to be discharged to:: Private residence Living Arrangements: Children Available Help at Discharge: Family;Available 24 hours/day Type of Home: House Home Access: Stairs to enter [employer] of Steps: 2 Entrance Stairs-Rails: None Home Layout: One level     Bathroom Shower/Tub: Teacher, early years/pre: Handicapped height     Home Equipment: Environmental consultant - 2 wheels;Wheelchair - manual;Cane - single point;Bedside commode          Prior Functioning/Environment Level of Independence: Needs assistance  Gait / Transfers Assistance Needed: Coming from SNF where pt was receiving rehab. Reported assist when home from daughter. ADL's / Homemaking Assistance Needed: Needs assist for bathing via sponge bath. Reported independent for dressing.            OT Problem List: Decreased strength;Decreased activity tolerance;Impaired balance (sitting and/or standing)      OT Treatment/Interventions: Self-care/ADL training;Therapeutic exercise;DME and/or AE instruction;Therapeutic activities;Patient/family education;Balance training    OT Goals(Current goals can be found in the care plan section) Acute Rehab OT Goals Patient Stated Goal: Return home OT Goal  Formulation: With patient Time For Goal Achievement: 10/15/20 Potential to Achieve Goals: Good  OT Frequency: Min 2X/week               Co-evaluation PT/OT/SLP Co-Evaluation/Treatment: Yes Reason for Co-Treatment: To address functional/ADL transfers   OT goals addressed during session: ADL's and self-care;Strengthening/ROM                       End of Session Equipment Utilized During Treatment: Rolling walker Nurse Communication:  (Nursing present for portions of eval.)  Activity Tolerance: Patient tolerated treatment well Patient left: in chair;with nursing/sitter in room;with bed alarm set  OT Visit Diagnosis: Unsteadiness on feet (R26.81);Muscle weakness (generalized) (M62.81)                Time: 0981-1914 OT Time Calculation (min): 22 min Charges:  OT General Charges $OT Visit: 1 Visit OT Evaluation $OT Eval Low Complexity: 1 Low  Andrw Mcguirt OT, MOT   Larey Seat 10/01/2020, 10:06 AM

## 2020-10-01 NOTE — TOC Initial Note (Signed)
Transition of Care [facility]) - Initial/Assessment Note    Patient Details  Name: Rachael Johnson MRN: [id number] Date of Birth: 08-06-1950  Transition of Care [facility] CM/SW Contact:    Ihor Gully, LCSW Phone Number: 10/01/2020, 5:16 PM  Clinical Narrative:                 Patient from home with daughter. Wants to return home.  Patient has wc and walker. Patient has home oxygen per her report. SNF recommended by PT. Deferred to son, [name]. Discussed PT recommendation with son and patient's desire to go home. Mr. Theda Sers indicated that he would have to speak with family before making a decision regarding SNF or Home with [facility].     Expected Discharge Plan: [place] Barriers to Discharge: Continued Medical Work up   Patient Goals and CMS Choice Patient states their goals for this hospitalization and ongoing recovery are:: Patient wants to return home      Expected Discharge Plan and Services Expected Discharge Plan: [place]       Living arrangements for the past 2 months: Single Family Home                                      Prior Living Arrangements/Services Living arrangements for the past 2 months: Single Family Home Lives with:: Adult Children Patient language and need for interpreter reviewed:: Yes Do you feel safe going back to the place where you live?: Yes      Need for Family Participation in Patient Care: Yes (Comment) Care giver support system in place?: Yes (comment)   Criminal Activity/Legal Involvement Pertinent to Current Situation/Hospitalization: No - Comment as needed  Activities of Daily Living Home Assistive Devices/Equipment: Walker (specify type) ADL Screening (condition at time of admission) Patient's cognitive ability adequate to safely complete daily activities?: Yes Is the patient deaf or have difficulty hearing?: No Does the patient have difficulty seeing, even when wearing glasses/contacts?:  No Does the patient have difficulty concentrating, remembering, or making decisions?: No Patient able to express need for assistance with ADLs?: Yes Does the patient have difficulty dressing or bathing?: Yes Independently performs ADLs?: No Communication: Independent Dressing (OT): Independent Grooming: Independent Feeding: Independent Bathing: Independent Does the patient have difficulty walking or climbing stairs?: No Weakness of Legs: Both Weakness of Arms/Hands: None  Permission Sought/Granted Permission sought to share information with : Family Supports    Share Information with NAME: [name]           Emotional Assessment Appearance:: Appears stated age   Affect (typically observed): Appropriate   Alcohol / Substance Use: Not Applicable Psych Involvement: No (comment)  Admission diagnosis:  Anemia [D64.9] Patient Active Problem List   Diagnosis Date Noted  . Malnutrition of moderate degree 10/01/2020  . Anemia 09/30/2020  . Acute lower UTI 09/30/2020  . Pressure injury of skin 09/30/2020  . Severe protein-calorie malnutrition ([place])   . Adult failure to thrive   . Weakness   . Acute encephalopathy   . Palliative care by specialist   . Osteomyelitis ([place]) 04/17/2020  . Pressure injury of sacral region, stage 4 ([place]) 04/17/2020  . Decubitus ulcer of left heel, unstageable ([place]) 04/17/2020  . Pernicious anemia 10/31/2019  . Hypokalemia 10/16/2019  . Insomnia 02/05/2019  . Urinary frequency 06/19/2018  . 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 Sinusitis  11/05/2017  . Chronic back pain 09/09/2017  . Osteoarthritis 05/24/2017  . Iron deficiency anemia due to chronic blood loss 04/03/2017  . COPD (chronic obstructive pulmonary disease) ([place]) 01/23/2017  . Vitamin D deficiency 03/07/2016  . Dysuria 03/07/2016  . Essential hypertension 03/07/2016  . Benign paroxysmal positional vertigo 03/07/2016  . Hyperglycemia 12/17/2015  . Cataract 12/21/2014  .  Restless leg syndrome 09/30/2014  . Hyperlipidemia 02/25/2013  . Umbilical hernia 50/72/2575  . Macrocytic anemia 07/08/2012  . Weight loss 12/29/2011  . Depression with anxiety 03/24/2011  . 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 Hypothyroid 02/06/2011  . Thrombocythemia 01/24/2011   PCP:  Mosie Lukes, MD Pharmacy:   [facility] [address] [place] [address] [place] [address] Phone: [phone] Fax: [phone]  [place], [place] [address] [place] [address] Phone: [phone] Fax: [place], [place] - [place] [address] Phone: [phone] Fax: [phone]  [place], [place] [address] [place] [address] Phone: [phone] Fax: [phone]     Social Determinants of Health (SDOH) Interventions    Readmission Risk Interventions No flowsheet data found.

## 2020-10-01 NOTE — Plan of Care (Signed)
  Problem: Education: Goal: Knowledge of General Education information will improve Description: Including pain rating scale, medication(s)/side effects and non-pharmacologic comfort measures 10/01/2020 1058 by Adam Phenix, RN Outcome: Progressing 10/01/2020 0736 by Adam Phenix, RN Outcome: Progressing   Problem: Health Behavior/Discharge Planning: Goal: Ability to manage health-related needs will improve 10/01/2020 1058 by Adam Phenix, RN Outcome: Progressing 10/01/2020 0736 by Adam Phenix, RN Outcome: Progressing   Problem: Clinical Measurements: Goal: Ability to maintain clinical measurements within normal limits will improve 10/01/2020 1058 by Adam Phenix, RN Outcome: Progressing 10/01/2020 0736 by Adam Phenix, RN Outcome: Progressing Goal: Will remain free from infection 10/01/2020 1058 by Adam Phenix, RN Outcome: Progressing 10/01/2020 0736 by Adam Phenix, RN Outcome: Progressing Goal: Diagnostic test results will improve 10/01/2020 1058 by Adam Phenix, RN Outcome: Progressing 10/01/2020 0736 by Adam Phenix, RN Outcome: Progressing Goal: Respiratory complications will improve 10/01/2020 1058 by Adam Phenix, RN Outcome: Progressing 10/01/2020 0736 by Adam Phenix, RN Outcome: Progressing Goal: Cardiovascular complication will be avoided 10/01/2020 1058 by Adam Phenix, RN Outcome: Progressing 10/01/2020 0736 by Adam Phenix, RN Outcome: Progressing   Problem: Activity: Goal: Risk for activity intolerance will decrease 10/01/2020 1058 by Adam Phenix, RN Outcome: Progressing 10/01/2020 0736 by Adam Phenix, RN Outcome: Progressing   Problem: Nutrition: Goal: Adequate nutrition will be maintained 10/01/2020 1058 by Adam Phenix, RN Outcome: Progressing 10/01/2020 0736 by Adam Phenix, RN Outcome: Progressing   Problem: Coping: Goal: Level  of anxiety will decrease 10/01/2020 1058 by Adam Phenix, RN Outcome: Progressing 10/01/2020 0736 by Adam Phenix, RN Outcome: Progressing   Problem: Elimination: Goal: Will not experience complications related to bowel motility 10/01/2020 1058 by Adam Phenix, RN Outcome: Progressing 10/01/2020 0736 by Adam Phenix, RN Outcome: Progressing Goal: Will not experience complications related to urinary retention 10/01/2020 1058 by Adam Phenix, RN Outcome: Progressing 10/01/2020 0736 by Adam Phenix, RN Outcome: Progressing   Problem: Pain Managment: Goal: General experience of comfort will improve 10/01/2020 1058 by Adam Phenix, RN Outcome: Progressing 10/01/2020 0736 by Adam Phenix, RN Outcome: Progressing   Problem: Safety: Goal: Ability to remain free from injury will improve 10/01/2020 1058 by Adam Phenix, RN Outcome: Progressing 10/01/2020 0736 by Adam Phenix, RN Outcome: Progressing   Problem: Skin Integrity: Goal: Risk for impaired skin integrity will decrease 10/01/2020 1058 by Adam Phenix, RN Outcome: Progressing 10/01/2020 0736 by Adam Phenix, RN Outcome: Progressing

## 2020-10-01 NOTE — Plan of Care (Signed)
  Problem: Acute Rehab PT Goals(only PT should resolve) Goal: Pt Will Go Supine/Side To Sit Outcome: Progressing Flowsheets (Taken 10/01/2020 1102) Pt will go Supine/Side to Sit: with min guard assist Goal: Patient Will Transfer Sit To/From Stand Outcome: Progressing Flowsheets (Taken 10/01/2020 1102) Patient will transfer sit to/from stand: with min guard assist Goal: Pt Will Transfer Bed To Chair/Chair To Bed Outcome: Progressing Flowsheets (Taken 10/01/2020 1102) Pt will Transfer Bed to Chair/Chair to Bed: min guard assist Goal: Pt Will Ambulate Outcome: Progressing Flowsheets (Taken 10/01/2020 1102) Pt will Ambulate:  50 feet  with min guard assist  with supervision  with rolling walker   11:04 AM, 10/01/20 Lonell Grandchild, MPT Physical Therapist with [facility] [phone] office [phone] mobile phone

## 2020-10-01 NOTE — Plan of Care (Signed)
  Problem: Acute Rehab OT Goals (only OT should resolve) Goal: Pt. Will Perform Grooming Flowsheets (Taken 10/01/2020 1009) Pt Will Perform Grooming:  with modified independence  standing Goal: Pt. Will Perform Lower Body Dressing Flowsheets (Taken 10/01/2020 1009) Pt Will Perform Lower Body Dressing:  with modified independence  sitting/lateral leans Goal: Pt. Will Transfer To Toilet Flowsheets (Taken 10/01/2020 1009) Pt Will Transfer to Toilet:  with modified independence  stand pivot transfer Goal: Pt/Caregiver Will Perform Home Exercise Program Flowsheets (Taken 10/01/2020 1009) Pt/caregiver will Perform Home Exercise Program:  Increased strength  Both right and left upper extremity  Independently   Pepper Wyndham OT, MOT

## 2020-10-01 NOTE — Evaluation (Signed)
Physical Therapy Evaluation Patient Details Name: Rachael Johnson MRN: [id number] DOB: 1951/06/23 Today's Date: 10/01/2020   History of Present Illness  Rachael Johnson is a 70 y.o. female with medical history significant of multiple comorbidities (COPD not O2 dependent, AKI, anemia of chronic disease-iron deficiency folate deficiency , HTN, HLD, depression, anxiety, GERD, essential thrombocytopenia, OA, RA, RLS, ) including recent hospitalizations for pneumonia, failure to thrive, weight loss, according to son borderline leukemia follows with oncologist at [facility] Dr. Marin Johnson (has had 3 blood transfusion in past 3 years).  Presenting from SNF with a hemoglobin of 6.2.. Also complained of dysuria, burning and pain sensation in vaginal area... Denies have any discharge or odor symptoms been going on for 2-3 days.     Patient was diagnosed with SARS-CoV-2 3 weeks ago with quarantine, not hospitalized, not require oxygen-  Per patient's son she is fully vaccinated (not sure if she is boosted)      Patient Denies having: Fever, Chills, Cough, SOB, Chest Pain, Abd pain, N/V/D, headache, dizziness, lightheadedness, Joint pain, rash, open wounds    Clinical Impression  Patient demonstrates fair/good return for sitting up at bedside with slightly labored movement, able to ambulate to bathroom and transferred to commode using grab bar with Min assist, limited for ambulation mostly due to fatigue and tolerated sitting up in chair after therapy.  Patient will benefit from continued physical therapy in hospital and recommended venue below to increase strength, balance, endurance for safe ADLs and gait.    Follow Up Recommendations SNF    Equipment Recommendations  None recommended by PT    Recommendations for Other Services       Precautions / Restrictions Precautions Precautions: Fall Restrictions Weight Bearing Restrictions: No      Mobility  Bed Mobility Overal bed mobility: Needs  Assistance Bed Mobility: Supine to Sit     Supine to sit: Min assist     General bed mobility comments: increased time, labored movement    Transfers Overall transfer level: Needs assistance Equipment used: Rolling walker (2 wheeled) Transfers: Sit to/from [facility] Sit to Stand: Min guard;Min assist Stand pivot transfers: Min assist       General transfer comment: slow labored movement; unsteady  Ambulation/Gait Ambulation/Gait assistance: Min assist Gait Distance (Feet): 22 Feet Assistive device: Rolling walker (2 wheeled) Gait Pattern/deviations: Decreased step length - right;Decreased step length - left;Decreased stride length;Trunk flexed Gait velocity: decreased   General Gait Details: slow labored cadence without loss of balance, has to lean over RW for support once fatigued  Stairs            Wheelchair Mobility    Modified Rankin (Stroke Patients Only)       Balance Overall balance assessment: Needs assistance Sitting-balance support: Feet supported;No upper extremity supported Sitting balance-Leahy Scale: Good Sitting balance - Comments: EOB   Standing balance support: Bilateral upper extremity supported;During functional activity Standing balance-Leahy Scale: Fair Standing balance comment: using RW                             Pertinent Vitals/Pain Pain Assessment: Faces Faces Pain Scale: Hurts little more Pain Location: R LE from previous stroke. Gluteal regioin from previous sores. Pain Descriptors / Indicators: Grimacing Pain Intervention(s): Limited activity within patient's tolerance;Monitored during session;Repositioned    Home Living Family/patient expects to be discharged to:: Private residence Living Arrangements: Children Available Help at Discharge: Family;Available 24 hours/day Type  of Home: House Home Access: Stairs to enter Entrance Stairs-Rails: None Entrance Stairs-Number of Steps: 2 Home  Layout: One level Home Equipment: Walker - 2 wheels;Wheelchair - manual;Cane - single point;Bedside commode      Prior Function Level of Independence: Needs assistance   Gait / Transfers Assistance Needed: Coming from SNF where pt was receiving rehab. Reported assist when home from daughter. Was household ambulator using RW at home prior to having to go to SNF  ADL's / Homemaking Assistance Needed: Needs assist for bathing via sponge bath. Reported independent for dressing.        Hand Dominance   Dominant Hand: Right    Extremity/Trunk Assessment   Upper Extremity Assessment Upper Extremity Assessment: Defer to OT evaluation    Lower Extremity Assessment Lower Extremity Assessment: Generalized weakness    Cervical / Trunk Assessment Cervical / Trunk Assessment: Kyphotic  Communication   Communication: No difficulties  Cognition Arousal/Alertness: Awake/alert Behavior During Therapy: WFL for tasks assessed/performed Overall Cognitive Status: Within Functional Limits for tasks assessed                                        General Comments      Exercises     Assessment/Plan    PT Assessment Patient needs continued PT services  PT Problem List Decreased strength;Decreased activity tolerance;Decreased balance;Decreased mobility       PT Treatment Interventions Gait training;Stair training;Functional mobility training;Therapeutic activities;Therapeutic exercise;DME instruction;Balance training;Patient/family education    PT Goals (Current goals can be found in the Care Plan section)  Acute Rehab PT Goals Patient Stated Goal: Return home PT Goal Formulation: With patient Time For Goal Achievement: 10/15/20 Potential to Achieve Goals: Good    Frequency Min 3X/week   Barriers to discharge        Co-evaluation PT/OT/SLP Co-Evaluation/Treatment: Yes Reason for Co-Treatment: Complexity of the patient's impairments (multi-system  involvement);For patient/therapist safety PT goals addressed during session: Mobility/safety with mobility OT goals addressed during session: ADL's and self-care;Strengthening/ROM       AM-PAC PT "6 Clicks" Mobility  Outcome Measure Help needed turning from your back to your side while in a flat bed without using bedrails?: A Little Help needed moving from lying on your back to sitting on the side of a flat bed without using bedrails?: A Little Help needed moving to and from a bed to a chair (including a wheelchair)?: A Little Help needed standing up from a chair using your arms (e.g., wheelchair or bedside chair)?: A Little Help needed to walk in hospital room?: A Lot Help needed climbing 3-5 steps with a railing? : Total 6 Click Score: 15    End of Session   Activity Tolerance: Patient tolerated treatment well;Patient limited by fatigue Patient left: in chair;with call bell/phone within reach;with chair alarm set Nurse Communication: Mobility status PT Visit Diagnosis: Unsteadiness on feet (R26.81);Other abnormalities of gait and mobility (R26.89);Muscle weakness (generalized) (M62.81)    Time: 1572-6203 PT Time Calculation (min) (ACUTE ONLY): 30 min   Charges:   PT Evaluation $PT Eval Moderate Complexity: 1 Mod PT Treatments $Therapeutic Activity: 23-37 mins        11:01 AM, 10/01/20 Lonell Grandchild, MPT Physical Therapist with [facility] [phone] office [phone] mobile phone

## 2020-10-01 NOTE — Consult Note (Addendum)
[place] Nurse Consult Note: Consult completed remotely with record review and bedside RN assistance.  Reason for Consult: Stage 4 sacrum and stage 3 right heel, present on admission.  Wound type: full thickness pressure injury  Pressure Injury POA: Yes Measurement: sacrum:  5 cm x 4 cm x 1.5 cm Right heel:  3 cm x 3 cm x 0.2 cm  Left heel:  Nonblanchable erythema Wound NOM:VEHM with 30% fibrin Sacrum is pale pink nongraulating Drainage (amount, consistency, odor) minimal serosanguinous  No odor.  Periwound:intact  Bilateral lower legs edematous.  Dressing procedure/placement/frequency:Cleanse wounds to sacrum and heel with NS and pat dry. Apply alginate to wound bed. Fill in dead space to sacral wound with fluffed gauze. Cover with foam dressing.  Change every other day.  Right trochanter:  Cleanse with NS and pat dry. APPLY SANTYL to slough.  Fill remaining dead space with fluffed gauze.  Top with dry gauze and foam dressing.  Change daily.   Late Entry:  Bedside RN found unstageable wound to right trochanter.  Will begin Santyl to this area.  Wound is 2.5 cm x 2.5 cm x 1 cm noted depth but 30% devitalized tissue.   Will not follow at this time.  Please re-consult if needed.  Domenic Moras MSN, RN, FNP-BC CWON Wound, Ostomy, Continence Nurse Pager [phone]

## 2020-10-01 NOTE — Progress Notes (Signed)
PROGRESS NOTE    Patient: Rachael Johnson                            PCP: Mosie Lukes, MD                    DOB: 1950/12/21            DOA: 09/30/2020 ZSW:[id number]             DOS: 10/01/2020, 11:27 AM   LOS: 1 day   Date of Service: The patient was seen and examined on 10/01/2020  Subjective:   The patient was seen and examined this morning. Stable at this time. Still complaining of dysuria, pain and burning on urination, but somewhat improved with current medication... Denies any dizziness chest pain or shortness of breath Tolerated blood transfusion overnight. Otherwise no issues overnight .  Brief Narrative:   Rachael Johnson is a 70 y.o. 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 Marin Olp (has had 3 blood transfusion in past 3 years). Presenting from SNF with a hemoglobin of 6.2.. Also complained of dysuria, burning and pain sensation in vaginal area... Denies have any discharge or odor symptoms been going on for 2-3 days.  Patient was diagnosed with SARS-CoV-2 3 weeks ago with quarantine, not hospitalized, not require oxygen- Per patient's son she is fully vaccinated (not sure if she is boosted)   Patient Denies having: Fever, Chills, Cough, SOB, Chest Pain, Abd pain, N/V/D, headache, dizziness, lightheadedness, Joint pain, rash, open wounds  ED Course:   Vitals: Within normal limits Abnormal labs; CBC WBC 3.3, hemoglobin 6.2, hematocrit 20.6, platelet 285,  BMP within normal meds Ferritin 1060, TIBC 151, iron 88   Assessment & Plan:   Principal Problem:   Anemia Active Problems:   Dysuria   Acute lower UTI    Hypothyroid   Depression with anxiety   Macrocytic anemia   Hyperlipidemia   Essential hypertension   COPD (chronic obstructive pulmonary disease) (HCC)   Pressure injury of skin   Principal Problem:   Anemia-acute on chronic anemia Symptomatic anemia -History of chronic anemia due to iron deficiency B12 deficiency on supplements no signs of bleeding -Per patient son has borderline leukemia -myelofibrosis follows with oncologist Dr.Ennever She has had blood transfusion in the past 2 to 3 years, last blood transfusion last month. Electronic records also recorded iron deficiency anemia, B12 deficiency anemia in the past -Hemoglobin 6.2 >>> 2 units of PRBC blood transfusion >> 9.0 -Iron studies reviewed: Total iron normal at 88, TIBC elevated 151, ferritin 1060, -Folate normal at 9.7, B12 normal at 479 (patient is on supplemental folate, B12 shots) -Hemoccult negative -No active signs of bleeding  Dysuria -possible acute cystitis-released infection Still complaining of pain and burning upon urination -UA consistent with urine tract infection -We will follow with urine culture >>>  -Physical exam did not reveal any fishy odor or discharge -We will continue it Diflucan, Pyridium -Continue with IV Rocephin---   SARS-CoV-2 positive -Patient had a recent history of SARS-CoV-2 infection 3 weeks ago, also vaccinated -Remained asymptomatic, no upper respiratory symptoms -Withholding treatments at this point -Inhaler oxygen supplement if  needed -Currently satting 98% on room air   Active Problems: History of recent hospitalizations with pneumonia -currently stable in no respite distress, afebrile normotensive   Hypothyroid -continue Synthroid   Depression with anxiety -continue as needed Xanax   Macrocytic anemia -as above needing transfusion   Hyperlipidemia -continue statins   Essential hypertension -currently stable, with holding home medication of l Lasix   COPD (chronic  obstructive pulmonary disease) ([place]) -continue home inhalers as needed nebs History of myelofibrosis - h/o ? leukemia (per son) thrombocytopenia, leukopenia, anemia - -outpatient follow-up with Dr. Marin Olp -Records indicate previously patient was on Xarelto - at SNF subcu Lovenox for DVT prophylaxis given the history of myelofibrosis -will withhold Lovenox for now given her severe anemia  Lovenox for DVT prophylaxis in the setting of myelofibrosis -on hold Discussed with oncology on-call, curbside consultation records reviewed, currently not recommending any anticoagulation therapy     ------------------------------------------------------------------------------------------------------------------------------- Nutritional status:  The patient's BMI is: Body mass index is 21.53 kg/m. I agree with the assessment and plan as outlined---   Skin Assessment: I have examined the patient's skin and I agree with the wound assessment as performed by wound care team As outlined belowe: Pressure Injury 09/30/20 Sacrum Mid Stage 4 - Full thickness tissue loss with exposed bone, tendon or muscle. (Active)  09/30/20 1600  Location: Sacrum  Location Orientation: Mid  Staging: Stage 4 - Full thickness tissue loss with exposed bone, tendon or muscle.  Wound Description (Comments):   Present on Admission: Yes     Pressure Injury 04/17/20 Heel Left Deep Tissue Pressure Injury - Purple or maroon localized area of discolored intact skin or blood-filled blister due to damage of underlying soft tissue from pressure and/or shear. large purple intact wound (Active)  04/17/20 0156  Location: Heel  Location Orientation: Left  Staging: Deep Tissue Pressure Injury - Purple or maroon localized area of discolored intact skin or blood-filled blister due to damage of underlying soft tissue from pressure and/or shear.  Wound Description (Comments): large purple intact wound  Present on Admission: Yes      Pressure Injury 09/30/20 Heel Posterior;Right Stage 3 -  Full thickness tissue loss. Subcutaneous fat may be visible but bone, tendon or muscle are NOT exposed. (Active)  09/30/20 1600  Location: Heel  Location Orientation: Posterior;Right  Staging: Stage 3 -  Full thickness tissue loss. Subcutaneous fat may be visible but bone, tendon or muscle are NOT exposed.  Wound Description (Comments):   Present on Admission: Yes       Cultures:  -Urine cultures Antimicrobial: -IV Rocephin  Consults called:  None --------------------------------------------------------------------------------------------------------------------------------------- DVT prophylaxis: SCD/Compression stockings Code Status:   Code Status: Full Code   Admission status: Patient will be admitted as Inpatient, with a greater than 2 midnight length of stay. Level of care: Med-Surg   Family Communication:  none at bedside -discussed with her son Mr. Sonda Rumble over the phone (The above findings and plan of care has been discussed with patient in detail, the patient expressed understanding and agreement of above plan)  --------------------------------------------------------------------------------------------------------------------------------------------------  Disposition Plan:  Anticipated 1-2 days Status is: Inpatient  Remains inpatient appropriate because:Hemodynamically unstable and Inpatient level of care appropriate due to severity of illness   Dispo: The patient is from: SNF  Anticipated d/c is to: SNF  Patient currently is not medically stable to d/c.              Difficult to place patient No    Level of  care: Telemetry   Procedures:   No admission procedures for hospital encounter.     Antimicrobials:  Anti-infectives (From admission, onward)   Start     Dose/Rate Route Frequency Ordered Stop   09/30/20 1430  fluconazole (DIFLUCAN) tablet 150 mg         150 mg Oral Daily 09/30/20 1418     09/30/20 1330  cefTRIAXone (ROCEPHIN) 1 g in sodium chloride 0.9 % 100 mL IVPB        1 g 200 mL/hr over 30 Minutes Intravenous Every 24 hours 09/30/20 1322 10/03/20 1329       Medication:  . (feeding supplement) PROSource Plus  30 mL Oral TID WC  . atorvastatin  10 mg Oral Daily  . Chlorhexidine Gluconate Cloth  6 each Topical Daily  . cyanocobalamin  1,000 mcg Intramuscular Q30 days  . estradiol  0.5 mg Oral BID  . famotidine  20 mg Oral BID  . fluconazole  150 mg Oral Daily  . fluticasone furoate-vilanterol  1 puff Inhalation Daily  . folic acid  950 mcg Oral Daily  . hydroxyurea  500 mg Oral QHS  . levothyroxine  125 mcg Oral QAC breakfast  . multivitamin with minerals  1 tablet Oral q morning  . phenazopyridine  200 mg Oral TID WC  . sodium chloride flush  3 mL Intravenous Q12H  . sodium chloride flush  3 mL Intravenous Q12H  . thiamine  100 mg Oral Daily    sodium chloride, acetaminophen **OR** acetaminophen, albuterol, ALPRAZolam, alum & mag hydroxide-simeth, bisacodyl, hydrALAZINE, HYDROmorphone (DILAUDID) injection, ipratropium, magnesium citrate, ondansetron **OR** ondansetron (ZOFRAN) IV, oxyCODONE, senna-docusate, sodium chloride flush, traZODone   Objective:   Vitals:   10/01/20 0051 10/01/20 0500 10/01/20 0607 10/01/20 0910  BP: 129/79  128/77   Pulse: 99  98   Resp: 16  17   Temp: (!) 100.5 F (38.1 C)  98 F (36.7 C)   TempSrc: Axillary     SpO2: 90%  93% 94%  Weight:  56.9 kg    Height:        Intake/Output Summary (Last 24 hours) at 10/01/2020 1127 Last data filed at 10/01/2020 0042 Gross per 24 hour  Intake 743 ml  Output 200 ml  Net 543 ml   Filed Weights   09/30/20 1054 10/01/20 0500  Weight: 53.1 kg 56.9 kg     Examination:   Physical Exam  Constitution:  Alert, cooperative, no distress,  Appears calm and comfortable  Psychiatric: Normal and stable mood and affect, cognition intact,   HEENT:  Normocephalic, PERRL, otherwise with in Normal limits  Chest:Chest symmetric Cardio vascular:  S1/S2, RRR, No murmure, No Rubs or Gallops  pulmonary: Clear to auscultation bilaterally, respirations unlabored, negative wheezes / crackles Abdomen: Soft, non-tender, non-distended, bowel sounds,no masses, no organomegaly Muscular skeletal:  Severe generalized weaknesses Limited exam - in bed, able to move all 4 extremities, Normal strength,  Neuro: CNII-XII intact. , normal motor and sensation, reflexes intact  Extremities: No pitting edema lower extremities, +2 pulses  Skin: Dry, warm to touch, negative for any Rashes, No open wounds Wounds: per nursing documentation Pressure Injury 09/30/20 Sacrum Mid Stage 4 - Full thickness tissue loss with exposed bone, tendon or muscle. (Active)  09/30/20 1600  Location: Sacrum  Location Orientation: Mid  Staging: Stage 4 - Full thickness tissue loss with exposed bone, tendon or muscle.  Wound Description (Comments):   Present on Admission: Yes     Pressure Injury  04/17/20 Heel Left Deep Tissue Pressure Injury - Purple or maroon localized area of discolored intact skin or blood-filled blister due to damage of underlying soft tissue from pressure and/or shear. large purple intact wound (Active)  04/17/20 0156  Location: Heel  Location Orientation: Left  Staging: Deep Tissue Pressure Injury - Purple or maroon localized area of discolored intact skin or blood-filled blister due to damage of underlying soft tissue from pressure and/or shear.  Wound Description (Comments): large purple intact wound  Present on Admission: Yes     Pressure Injury 09/30/20 Heel Posterior;Right Stage 3 -  Full thickness tissue loss. Subcutaneous fat may be visible but bone, tendon or muscle are NOT exposed. (Active)  09/30/20 1600  Location: Heel  Location Orientation: Posterior;Right  Staging: Stage 3 -  Full thickness tissue loss. Subcutaneous fat may be visible but bone,  tendon or muscle are NOT exposed.  Wound Description (Comments):   Present on Admission: Yes    ------------------------------------------------------------------------------------------------------------------------------------------    LABs:  CBC Latest Ref Rng & Units 10/01/2020 09/30/2020 09/30/2020  WBC 4.0 - 10.5 K/uL 4.4 4.0 3.3(L)  Hemoglobin 12.0 - 15.0 g/dL 9.0(L) 8.7(L) 6.2(LL)  Hematocrit 36.0 - 46.0 % 27.9(L) 27.1(L) 20.6(L)  Platelets 150 - 400 K/uL 189 189 285   CMP Latest Ref Rng & Units 10/01/2020 09/30/2020 09/09/2020  Glucose 70 - 99 mg/dL 98 81 96  BUN 8 - 23 mg/dL 15 17 19   Creatinine 0.44 - 1.00 mg/dL 0.40(L) 0.55 0.57  Sodium 135 - 145 mmol/L 138 137 135  Potassium 3.5 - 5.1 mmol/L 3.6 3.6 3.9  Chloride 98 - 111 mmol/L 108 106 107  CO2 22 - 32 mmol/L 23 23 25   Calcium 8.9 - 10.3 mg/dL 8.1(L) 8.0(L) 8.9  Total Protein 6.5 - 8.1 g/dL - - 7.3  Total Bilirubin 0.3 - 1.2 mg/dL - - 1.0  Alkaline Phos 38 - 126 U/L - - 149(H)  AST 15 - 41 U/L - - 29  ALT 0 - 44 U/L - - 31       Micro Results Recent Results (from the past 240 hour(s))  SARS CORONAVIRUS 2 (TAT 6-24 HRS) Nasopharyngeal Nasopharyngeal Swab     Status: Abnormal   Collection Time: 09/30/20  1:56 PM   Specimen: Nasopharyngeal Swab  Result Value Ref Range Status   SARS Coronavirus 2 POSITIVE (A) NEGATIVE Final    Comment: (NOTE) SARS-CoV-2 target nucleic acids are DETECTED.  The SARS-CoV-2 RNA is generally detectable in upper and lower respiratory specimens during the acute phase of infection. Positive results are indicative of the presence of SARS-CoV-2 RNA. Clinical correlation with patient history and other diagnostic information is  necessary to determine patient infection status. Positive results do not rule out bacterial infection or co-infection with other viruses.  The expected result is Negative.  Fact Sheet for Patients: SugarRoll.be  Fact Sheet for Healthcare  Providers: https://www.woods-mathews.com/  This test is not yet approved or cleared by the [place] FDA and  has been authorized for detection and/or diagnosis of SARS-CoV-2 by FDA under an Emergency Use Authorization (EUA). This EUA will remain  in effect (meaning this test can be used) for the duration of the COVID-19 declaration under Section 564(b)(1) of the Act, 21 U. S.C. section 360bbb-3(b)(1), unless the authorization is terminated or revoked sooner.   Performed at [facility], [place] [address], [place], [place] [address]     Radiology Reports DG Chest Portable 1 View  Result Date: 09/09/2020  CLINICAL DATA:  COVID positive with fever. EXAM: PORTABLE CHEST 1 VIEW COMPARISON:  07/28/2020 FINDINGS: 0645 hours. Cardiopericardial silhouette is at upper limits of normal for size. Interstitial markings are diffusely coarsened with chronic features. Patchy airspace disease in the upper lungs bilaterally, improved on the right since prior study. The visualized bony structures of the thorax show no acute abnormality. Telemetry leads overlie the chest. IMPRESSION: Chronic interstitial coarsening with patchy upper lung predominant bibasilar airspace opacities. Electronically Signed   By: Misty Stanley M.D.   On: 09/09/2020 07:00    SIGNED: Deatra James, MD, FHM. [facility],  Pager (please use [email or web address] to page/text) Please use Epic Secure Chat for non-urgent communication (7AM-7PM)  If 7PM-7AM, please contact night-coverage [email or web address], 10/01/2020, 11:27 AM

## 2020-10-01 NOTE — Plan of Care (Signed)

## 2020-10-02 DIAGNOSIS — D649 Anemia, unspecified: Secondary | ICD-10-CM | POA: Diagnosis not present

## 2020-10-02 DIAGNOSIS — J449 Chronic obstructive pulmonary disease, unspecified: Secondary | ICD-10-CM | POA: Diagnosis not present

## 2020-10-02 DIAGNOSIS — F418 Other specified anxiety disorders: Secondary | ICD-10-CM | POA: Diagnosis not present

## 2020-10-02 DIAGNOSIS — N390 Urinary tract infection, site not specified: Secondary | ICD-10-CM | POA: Diagnosis not present

## 2020-10-02 LAB — BASIC METABOLIC PANEL
Anion gap: 7 (ref 5–15)
BUN: 17 mg/dL (ref 8–23)
CO2: 25 mmol/L (ref 22–32)
Calcium: 8.6 mg/dL — ABNORMAL LOW (ref 8.9–10.3)
Chloride: 107 mmol/L (ref 98–111)
Creatinine, Ser: 0.37 mg/dL — ABNORMAL LOW (ref 0.44–1.00)
GFR, Estimated: >60 mL/min (ref >60)
Glucose, Bld: 98 mg/dL (ref 70–99)
Potassium: 3.6 mmol/L (ref 3.5–5.1)
Sodium: 139 mmol/L (ref 135–145)

## 2020-10-02 LAB — URINE CULTURE: Culture: <10000 — AB

## 2020-10-02 LAB — CBC
HCT: 28.4 % — ABNORMAL LOW (ref 36.0–46.0)
Hemoglobin: 9.1 g/dL — ABNORMAL LOW (ref 12.0–15.0)
MCH: 35.1 pg — ABNORMAL HIGH (ref 26.0–34.0)
MCHC: 32 g/dL (ref 30.0–36.0)
MCV: 109.7 fL — ABNORMAL HIGH (ref 80.0–100.0)
Platelets: 174 10*3/uL (ref 150–400)
RBC: 2.59 MIL/uL — ABNORMAL LOW (ref 3.87–5.11)
RDW: 24.8 % — ABNORMAL HIGH (ref 11.5–15.5)
WBC: 4.5 10*3/uL (ref 4.0–10.5)
nRBC: 0 % (ref 0.0–0.2)

## 2020-10-02 LAB — GLUCOSE, CAPILLARY
Glucose-Capillary: 89 mg/dL (ref 70–99)
Glucose-Capillary: 79 mg/dL (ref 70–99)
Glucose-Capillary: 104 mg/dL — ABNORMAL HIGH (ref 70–99)

## 2020-10-02 MED ORDER — CEFDINIR 300 MG PO CAPS: 300.0000 mg | ORAL_CAPSULE | Freq: Two times a day (BID) | ORAL | 0 refills | Status: AC

## 2020-10-02 MED ORDER — FLUTICASONE FUROATE-VILANTEROL 100-25 MCG/INH IN AEPB: 1.0000 | INHALATION_SPRAY | Freq: Every day | RESPIRATORY_TRACT | 1 refills | Status: AC

## 2020-10-02 MED ORDER — FLUCONAZOLE 150 MG PO TABS: 150.0000 mg | ORAL_TABLET | Freq: Every day | ORAL | 0 refills | Status: AC

## 2020-10-02 MED ORDER — PHENAZOPYRIDINE HCL 200 MG PO TABS: 200.0000 mg | ORAL_TABLET | Freq: Two times a day (BID) | ORAL | 0 refills | Status: AC

## 2020-10-02 MED ORDER — TRAZODONE HCL 100 MG PO TABS: 100.0000 mg | ORAL_TABLET | Freq: Every evening | ORAL | 2 refills | Status: AC | PRN

## 2020-10-02 NOTE — Discharge Summary (Signed)
Physician Discharge Summary  Rachael Johnson OAC:[id number] DOB: 06/16/1951 DOA: 09/30/2020  PCP: Mosie Lukes, MD  Admit date: 09/30/2020 Discharge date: 10/02/2020  Time spent: 35 minutes  Recommendations for Outpatient Follow-up:  Make sure patient has follow-up with hematology oncology as instructed Follow complete resolution of patient urinary symptoms Continue outpatient wound care.  Discharge Diagnoses:  Principal Problem:   Symptomatic anemia Active Problems:   Hypothyroid   Depression with anxiety   Macrocytic anemia   Hyperlipidemia   Dysuria   Essential hypertension   COPD (chronic obstructive pulmonary disease) (HCC)   Acute lower UTI   Pressure injury of skin   Malnutrition of moderate degree   Discharge Condition: Stable and improved. Discharged home with home health services and instruction to follow-up with PCP in 10 days.  CODE STATUS: Full code  Diet recommendation: Heart healthy diet. Continue feeding supplements.  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 Presenting from SNF with a hemoglobin of 6.2.Rachael KitchenAlso complained of dysuria, burning and pain sensation in vaginal area.Rachael KitchenMarland KitchenDenies haveanydischarge or odor symptoms been going on for 2-3 days.  Patient was diagnosed with SARS-CoV-2 3 weeks ago with quarantine, not hospitalized, not require oxygen- Per patient's son she is fully vaccinated(not sure if  she is boosted)  Patient Denies having: Fever, Chills, Cough, SOB, Chest Pain, Abd pain, N/V/D, headache, dizziness, lightheadedness, Joint pain, rash, open wounds  Hospital Course:  Anemia-acute on chronic anemia Symptomatic anemia -History of chronic anemia due to iron deficiency B12 deficiency on supplements no signs of bleeding -Per patient son has borderline leukemia -myelofibrosis follows with oncologist Dr.Ennever She has had blood transfusion in the past 2 to 3 years,last blood transfusion approx a month ago. Electronic records also recorded iron deficiency anemia, B12 deficiency anemia in the past. -Hemoglobin 6.2>>>2 units of PRBC blood transfusion >> 9.1 at discharge. -Iron studies reviewed:Total iron normal at 88, TIBC elevated 151, ferritin 1060, -Folate normal at 9.7, B12 normal at 479(patient is on supplemental folate,B12 shots) -Hemoccult negative. -No active signs of bleeding -Continue outpatient follow-up with hematologist/oncologist.  Dysuria-possible acute cystitis -reports improvement in dysuria -continue cefdinir, diflucan and pyridium as instructed -advised to maintained adequate hydration.  SARS-CoV-2 positive -Patient had a recent history of SARS-CoV-2 infection 3 weeks ago, also vaccinated -Remained asymptomatic, no upper respiratory symptoms -Withholding treatments at this point -Currently satting 98% on room air  History of recent hospitalizationswith pneumonia-currently stable in no respiratory distress, afebrile and normotensive. -No requiring oxygen supplementation.  Hypothyroid-continue Synthroid  Depression with anxiety -continue as needed Xanax -Resume the use of Effexor. -No suicidal ideation hallucination.  Macrocytic anemia -Continue folic acid and monthly B12 injections.  Hyperlipidemia-continue statins  Essential hypertension -Stable and well-controlled. -Continue monitoring sodium intake -Continue as  needed Lasix.  COPD(chronic obstructive pulmonary disease) (HCC)  -No wheezing, no complaints of shortness of breath. -continue home inhalers/nebulizer bronchodilators regimen and resume the use of Singulair.   History of myelofibrosis-h/o ?leukemia/thrombocytopenia, leukopenia, anemia - -outpatient follow-up with Dr. Marin Olp -Records indicate previously patient was on Xarelto- at  [facility] Lovenox for DVT prophylaxis given the history of myelofibrosis -LovenoxforDVT prophylaxis in the setting of myelofibrosis-on hold with ongoing anemia.  -Discussed with oncology (curbside) consultation records reviewed, currently not recommending any anticoagulation therapy. Further discussion at follow up visit as an outpatient.    Procedures:  See below for x-ray report.  Consultations:  Wound care service.  Discharge Exam: Vitals:   10/02/20 0749 10/02/20 1404  BP:  133/73  Pulse:  99  Resp:    Temp:  98.4 F (36.9 C)  SpO2: 92% 95%    General: Afebrile, no chest pain, no nausea, no vomiting. In no acute distress and feeling requesting to go back home with home health services. Cardiovascular: S1 and S2, no rubs, no gallops, no JVD. Respiratory: CTA bilaterally, no using accessory muscles, good saturation on RA. Abdomen: soft, NT, ND, posiitve BS. Extremities: no cyanosis, no clubbing. Skin: Stage IV sacrum and a stage III right heel present on admission. No signs of superimposed infection. Patient also with unstageable wound to her right trochanter, without signs of acute superimposed infection. Also present on admission.  Discharge Instructions   Discharge Instructions    Diet - low sodium heart healthy   Complete by: As directed    Discharge instructions   Complete by: As directed    Take medications as prescribed Maintain adequate hydration Complete antibiotic treatment as instructed Constant repositioning of wound care as recommended. Arrange follow-up with PCP  in 10 days.   Discharge wound care:   Complete by: As directed    Cleanse wounds in the sacrum and heel with normal saline and pat dry. Apply alginate to wound bed. Fill remaining dead space to sacral wound with fluffed gauze. Cover with foam dressing. Change every other day.  Right trochanter wound: Cleanse with normal saline and pat dry. Apply Santyl to slough, fill remaining dead space with fluffed gauze. Top with dry gauze and foam dressing. Change in daily basis and as needed for soiling.   Increase activity slowly   Complete by: As directed      Allergies as of 10/02/2020   No Known Allergies     Medication List    STOP taking these medications   budesonide-formoterol 80-4.5 MCG/ACT inhaler Commonly known as: Symbicort Replaced by: fluticasone furoate-vilanterol 100-25 MCG/INH Aepb   cephALEXin 500 MG capsule Commonly known as: KEFLEX   enoxaparin 40 MG/0.4ML injection Commonly known as: LOVENOX   fluticasone 50 MCG/ACT nasal spray Commonly known as: FLONASE   Fluticasone-Salmeterol 250-50 MCG/DOSE Aepb Commonly known as: ADVAIR   ibuprofen 400 MG tablet Commonly known as: ADVIL   Pegasys 180 MCG/ML injection Generic drug: peginterferon alfa-2a   rivaroxaban 10 MG Tabs tablet Commonly known as: XARELTO   sulfamethoxazole-trimethoprim 800-160 MG tablet Commonly known as: BACTRIM DS     TAKE these medications   acetaminophen 325 MG tablet Commonly known as: TYLENOL Take 2 tablets (650 mg total) by mouth every 6 (six) hours as needed for mild pain or headache (or Fever >/= 101).   albuterol 108 (90 Base) MCG/ACT inhaler Commonly known as: VENTOLIN HFA INHALE 2 PUFFS BY MOUTH EVERY 6 HOURS AS NEEDED FOR WHEEZING OR SHORTNESS OF BREATH   ALPRAZolam 1 MG tablet Commonly known as: XANAX TAKE 1 TABLET BY MOUTH THREE TIMES DAILY AS NEEDED FOR ANXIETY   alum & mag hydroxide-simeth 200-200-25 MG chewable tablet Commonly known as: GELUSIL Chew by mouth.    atorvastatin 10 MG tablet Commonly known as: LIPITOR Take 1 tablet (  10 mg total) by mouth daily.   cefdinir 300 MG capsule Commonly known as: OMNICEF Take 1 capsule (300 mg total) by mouth 2 (two) times daily for 5 days.   collagenase ointment Commonly known as: SANTYL Apply topically 2 (two) times daily. Apply twice a day to sacral decubitus ulcer.   cyanocobalamin 1000 MCG/ML injection Commonly known as: (VITAMIN B-12) Inject 1 ml into muscle q week x 4 then q monthly   estradiol 0.5 MG tablet Commonly known as: ESTRACE Take 1 tablet (0.5 mg total) by mouth 2 (two) times daily.   famotidine 40 MG tablet Commonly known as: PEPCID Take 40 mg by mouth daily.   feeding supplement (PRO-STAT SUGAR FREE 64) Liqd Take 30 mLs by mouth 3 (three) times daily with meals.   fluconazole 150 MG tablet Commonly known as: DIFLUCAN Take 1 tablet (150 mg total) by mouth daily for 3 days. Start taking on: October 03, 2020   fluticasone furoate-vilanterol 100-25 MCG/INH Aepb Commonly known as: BREO ELLIPTA Inhale 1 puff into the lungs daily. Start taking on: October 03, 2020 Replaces: budesonide-formoterol 80-4.5 MCG/ACT inhaler   folic acid 993 MCG tablet Commonly known as: FOLVITE Take 400 mcg by mouth daily.   furosemide 20 MG tablet Commonly known as: LASIX TAKE 1 TABLET BY MOUTH THREE TIMES DAILY AS NEEDED FOR EDEMA   hydroxyurea 500 MG capsule Commonly known as: HYDREA Take 500 mg by mouth at bedtime. May take with food to minimize GI side effects.   ipratropium-albuterol 0.5-2.5 (3) MG/3ML Soln Commonly known as: DUONEB Take 3 mLs by nebulization every 4 (four) hours as needed.   levothyroxine 125 MCG tablet Commonly known as: SYNTHROID Take 125 mcg by mouth daily before breakfast. What changed: Another medication with the same name was removed. Continue taking this medication, and follow the directions you see here.   linaclotide 145 MCG Caps capsule Commonly known as:  LINZESS Take 1 capsule (145 mcg total) by mouth daily before breakfast.   montelukast 10 MG tablet Commonly known as: SINGULAIR Take 1 tablet (10 mg total) by mouth at bedtime.   MULTIVITAMIN PO Take 1 tablet by mouth every morning.   ondansetron 8 MG tablet Commonly known as: ZOFRAN Take 0.5-1 tablets (4-8 mg total) by mouth every 8 (eight) hours as needed for nausea or vomiting.   oxyCODONE-acetaminophen 10-325 MG tablet Commonly known as: PERCOCET Take 1 tablet by mouth every 8 (eight) hours as needed for pain (severe pain).   phenazopyridine 200 MG tablet Commonly known as: PYRIDIUM Take 1 tablet (200 mg total) by mouth in the morning and at bedtime for 3 days.   thiamine 100 MG tablet Take 100 mg by mouth daily.   traZODone 100 MG tablet Commonly known as: DESYREL Take 1 tablet (100 mg total) by mouth at bedtime as needed for sleep. What changed:   when to take this  reasons to take this   venlafaxine XR 75 MG 24 hr capsule Commonly known as: EFFEXOR-XR TAKE 1 CAPSULE BY MOUTH THREE TIMES DAILY            Discharge Care Instructions  (From admission, onward)         Start     Ordered   10/02/20 0000  Discharge wound care:       Comments: Cleanse wounds in the sacrum and heel with normal saline and pat dry. Apply alginate to wound bed. Fill remaining dead space to sacral wound with fluffed gauze. Cover with foam  dressing. Change every other day.  Right trochanter wound: Cleanse with normal saline and pat dry. Apply Santyl to slough, fill remaining dead space with fluffed gauze. Top with dry gauze and foam dressing. Change in daily basis and as needed for soiling.   10/02/20 1616         No Known Allergies  Follow-up Information    [facility] Follow up.   Specialty: Home Health Services Why: HHPT and RN Contact information: [place] [address] [phone]        Mosie Lukes, MD. Schedule an appointment as  soon as possible for a visit in 10 day(s).   Specialty: Family Medicine Contact information: [place] [address] [phone]               The results of significant diagnostics from this hospitalization (including imaging, microbiology, ancillary and laboratory) are listed below for reference.    Significant Diagnostic Studies: DG Chest Portable 1 View  Result Date: 09/09/2020 CLINICAL DATA:  COVID positive with fever. EXAM: PORTABLE CHEST 1 VIEW COMPARISON:  07/28/2020 FINDINGS: 0645 hours. Cardiopericardial silhouette is at upper limits of normal for size. Interstitial markings are diffusely coarsened with chronic features. Patchy airspace disease in the upper lungs bilaterally, improved on the right since prior study. The visualized bony structures of the thorax show no acute abnormality. Telemetry leads overlie the chest. IMPRESSION: Chronic interstitial coarsening with patchy upper lung predominant bibasilar airspace opacities. Electronically Signed   By: Misty Stanley M.D.   On: 09/09/2020 07:00    Microbiology: Recent Results (from the past 240 hour(s))  SARS CORONAVIRUS 2 (TAT 6-24 HRS) Nasopharyngeal Nasopharyngeal Swab     Status: Abnormal   Collection Time: 09/30/20  1:56 PM   Specimen: Nasopharyngeal Swab  Result Value Ref Range Status   SARS Coronavirus 2 POSITIVE (A) NEGATIVE Final    Comment: (NOTE) SARS-CoV-2 target nucleic acids are DETECTED.  The SARS-CoV-2 RNA is generally detectable in upper and lower respiratory specimens during the acute phase of infection. Positive results are indicative of the presence of SARS-CoV-2 RNA. Clinical correlation with patient history and other diagnostic information is  necessary to determine patient infection status. Positive results do not rule out bacterial infection or co-infection with other viruses.  The expected result is Negative.  Fact Sheet for  Patients: SugarRoll.be  Fact Sheet for Healthcare Providers: https://www.woods-mathews.com/  This test is not yet approved or cleared by the [place] FDA and  has been authorized for detection and/or diagnosis of SARS-CoV-2 by FDA under an Emergency Use Authorization (EUA). This EUA will remain  in effect (meaning this test can be used) for the duration of the COVID-19 declaration under Section 564(b)(1) of the Act, 21 U. S.C. section 360bbb-3(b)(1), unless the authorization is terminated or revoked sooner.   Performed at [facility], [place] [address] [place], [place] [address]   Culture, Urine     Status: Abnormal   Collection Time: 09/30/20  9:50 PM   Specimen: Urine, Random  Result Value Ref Range Status   Specimen Description   Final    URINE, RANDOM Performed at [facility], [address], [place], [place] [address]    Special Requests   Final    NONE Performed at [facility], [address], [place], [place] [address]    Culture (A)  Final    <10,000 COLONIES/mL INSIGNIFICANT GROWTH Performed at [facility],  [address], [place], [place] [address]    Report Status 10/02/2020 FINAL  Final     Labs: Basic Metabolic Panel: Recent Labs  Lab 09/30/20 1115 10/01/20 0609 10/02/20 0603  NA 137 138 139  K 3.6 3.6 3.6  CL 106 108 107  CO2 23 23 25   GLUCOSE 81 98 98  BUN 17 15 17   CREATININE 0.55 0.40* 0.37*  CALCIUM 8.0* 8.1* 8.6*   CBC: Recent Labs  Lab 09/30/20 1115 09/30/20 2225 10/01/20 0609 10/02/20 0603  WBC 3.3* 4.0 4.4 4.5  NEUTROABS 2.4  --   --   --   HGB 6.2* 8.7* 9.0* 9.1*  HCT 20.6* 27.1* 27.9* 28.4*  MCV 122.6* 108.8* 107.3* 109.7*  PLT 285 189 189 174   BNP (last 3 results) Recent Labs    09/30/20 1115  BNP 105.0*   CBG: Recent Labs  Lab 10/01/20 1050 10/01/20 1549 10/02/20 0746 10/02/20 1141 10/02/20 1609  GLUCAP 115* 135* 89 79 104*    Signed:  Barton Dubois  MD.  [facility] 10/02/2020, 4:18 PM

## 2020-10-02 NOTE — TOC Transition Note (Signed)
Transition of Care [facility]) - CM/SW Discharge Note   Patient Details  Name: Rachael Johnson MRN: [id number] Date of Birth: Dec 02, 1950  Transition of Care [facility]) CM/SW Contact:  Natasha Bence, LCSW Phone Number: 10/02/2020, 11:03 AM   Clinical Narrative:    Clinical Narrative:    CSW received notification of patient's readiness for discharge. CSW observed HH order. CSW spoke with patient and inquired about agreeableness to [facility]. Patient agreeable to [facility] and express that she did not want previous [facility] agency, but was not able to remember the name. CSW identified in patient's chart that patient previously utilized [facility] for [facility]. CSW referred patient to [place] with Nanine Means. Lattie Haw agreeable to provide services. TOC signing off.   Final next level of care: Home w Hospice Care Barriers to Discharge: Barriers Resolved   Patient Goals and CMS Choice Patient states their goals for this hospitalization and ongoing recovery are:: Return home with [facility] CMS Medicare.gov Compare Post Acute Care list provided to:: Patient Choice offered to / list presented to : Patient  Discharge Placement                    Patient and family notified of of transfer: 10/02/20  Discharge Plan and Services                DME Arranged: N/A         HH Arranged: NA HH Agency: [facility] Date [place]: 10/02/20 Time [place]: 2703 Representative spoke with at [place]: [place] Interventions     Readmission Risk Interventions Readmission Risk Prevention Plan 10/02/2020  Transportation Screening Complete  PCP or Specialist Appt within 3-5 Days Not Complete  Not Complete comments PCP office not available on wknd to schedule appt  [place] or [place] Complete  Social Work Consult for [place] Planning/Counseling Complete  Palliative Care Screening Not Applicable  Medication Review Press photographer) Complete  Some recent data might  be hidden

## 2020-10-03 LAB — BASIC METABOLIC PANEL
Anion gap: 8 (ref 5–15)
BUN: 22 mg/dL (ref 8–23)
CO2: 24 mmol/L (ref 22–32)
Calcium: 8.6 mg/dL — ABNORMAL LOW (ref 8.9–10.3)
Chloride: 107 mmol/L (ref 98–111)
Creatinine, Ser: 0.43 mg/dL — ABNORMAL LOW (ref 0.44–1.00)
GFR, Estimated: >60 mL/min (ref >60)
Glucose, Bld: 95 mg/dL (ref 70–99)
Potassium: 3.5 mmol/L (ref 3.5–5.1)
Sodium: 139 mmol/L (ref 135–145)

## 2020-10-03 LAB — CBC
HCT: 28 % — ABNORMAL LOW (ref 36.0–46.0)
Hemoglobin: 9 g/dL — ABNORMAL LOW (ref 12.0–15.0)
MCH: 35.4 pg — ABNORMAL HIGH (ref 26.0–34.0)
MCHC: 32.1 g/dL (ref 30.0–36.0)
MCV: 110.2 fL — ABNORMAL HIGH (ref 80.0–100.0)
Platelets: 163 10*3/uL (ref 150–400)
RBC: 2.54 MIL/uL — ABNORMAL LOW (ref 3.87–5.11)
RDW: 24.6 % — ABNORMAL HIGH (ref 11.5–15.5)
WBC: 4.1 10*3/uL (ref 4.0–10.5)
nRBC: 0 % (ref 0.0–0.2)
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
## 2020-10-03 NOTE — Progress Notes (Signed)
Pt has attempted to call son to pick her up twice, with no answer.

## 2020-10-04 ENCOUNTER — Encounter: Payer: Medicare Other | Admitting: Family Medicine

## 2020-10-04 ENCOUNTER — Telehealth: Payer: Self-pay

## 2020-10-04 NOTE — Telephone Encounter (Signed)
Transition Care Management Unsuccessful Follow-up Telephone Call  Date of discharge and from where:  [place]  Attempts:  1st Attempt  Reason for unsuccessful TCM follow-up call:  No answer/busy

## 2020-10-05 DIAGNOSIS — G2581 Restless legs syndrome: Secondary | ICD-10-CM | POA: Diagnosis not present

## 2020-10-05 DIAGNOSIS — D510 Vitamin B12 deficiency anemia due to intrinsic factor deficiency: Secondary | ICD-10-CM | POA: Diagnosis not present

## 2020-10-05 DIAGNOSIS — E440 Moderate protein-calorie malnutrition: Secondary | ICD-10-CM | POA: Diagnosis not present

## 2020-10-05 DIAGNOSIS — I10 Essential (primary) hypertension: Secondary | ICD-10-CM | POA: Diagnosis not present

## 2020-10-05 DIAGNOSIS — L89620 Pressure ulcer of left heel, unstageable: Secondary | ICD-10-CM | POA: Diagnosis not present

## 2020-10-05 DIAGNOSIS — L89154 Pressure ulcer of sacral region, stage 4: Secondary | ICD-10-CM | POA: Diagnosis not present

## 2020-10-05 DIAGNOSIS — N390 Urinary tract infection, site not specified: Secondary | ICD-10-CM | POA: Diagnosis not present

## 2020-10-05 DIAGNOSIS — F418 Other specified anxiety disorders: Secondary | ICD-10-CM | POA: Diagnosis not present

## 2020-10-05 DIAGNOSIS — M069 Rheumatoid arthritis, unspecified: Secondary | ICD-10-CM | POA: Diagnosis not present

## 2020-10-05 DIAGNOSIS — M159 Polyosteoarthritis, unspecified: Secondary | ICD-10-CM | POA: Diagnosis not present

## 2020-10-05 DIAGNOSIS — J449 Chronic obstructive pulmonary disease, unspecified: Secondary | ICD-10-CM | POA: Diagnosis not present

## 2020-10-05 DIAGNOSIS — U071 COVID-19: Secondary | ICD-10-CM | POA: Diagnosis not present

## 2020-10-05 NOTE — Telephone Encounter (Signed)
Transition Care Management Follow-up Telephone Call Date of discharge and from where: 10/03/2020-[facility] How have you been since you were released from the hospital? Feeling better Any questions or concerns? No  Items Reviewed: Did the pt receive and understand the discharge instructions provided? Yes  Medications obtained and verified? Yes  Other? Yes  Any new allergies since your discharge? No  Dietary orders reviewed? Yes Do you have support at home? Yes   Home Care and Equipment/Supplies: Were home health services ordered? yes If so, what is the name of the agency? [facility]  Has the agency set up a time to come to the patient's home? yes Were any new equipment or medical supplies ordered?  No What is the name of the medical supply agency? N/a Were you able to get the supplies/equipment? not applicable Do you have any questions related to the use of the equipment or supplies? N/a  Functional Questionnaire: (I = Independent and D = Dependent) ADLs: I  Bathing/Dressing- I  Meal Prep- I  Eating- I  Maintaining continence- I  Transferring/Ambulation- I  Managing Meds- I  Follow up appointments reviewed:  PCP Hospital f/u appt confirmed? Yes  Scheduled to see Mackie Pai on 10/11/2020 @ 10:20. [facility] f/u appt confirmed? No  Patient is awaiting call from Dr. Antonieta Pert office Are transportation arrangements needed? No  If their condition worsens, is the pt aware to call PCP or go to the Emergency Dept.? Yes Was the patient provided with contact information for the PCP's office or ED? Yes Was to pt encouraged to call back with questions or concerns? Yes

## 2020-10-07 ENCOUNTER — Telehealth: Payer: Self-pay | Admitting: *Deleted

## 2020-10-07 NOTE — Telephone Encounter (Signed)
Left message on machine that appointment is Monday 10/11/20 at 1020am

## 2020-10-07 NOTE — Telephone Encounter (Signed)
Caller Name [place] Phone Number [phone] Patient Name Rachael Johnson Patient DOB 01-21-1951 Call Type Message Only Information Provided Reason for Call Request for General Office Information Initial Comment Caller wants to confirm when appointment is. Thinks the appointment is Monday at 12 pm.

## 2020-10-08 ENCOUNTER — Telehealth: Payer: Self-pay | Admitting: Family Medicine

## 2020-10-08 DIAGNOSIS — L89154 Pressure ulcer of sacral region, stage 4: Secondary | ICD-10-CM | POA: Diagnosis not present

## 2020-10-08 DIAGNOSIS — F418 Other specified anxiety disorders: Secondary | ICD-10-CM | POA: Diagnosis not present

## 2020-10-08 DIAGNOSIS — N390 Urinary tract infection, site not specified: Secondary | ICD-10-CM | POA: Diagnosis not present

## 2020-10-08 DIAGNOSIS — L89620 Pressure ulcer of left heel, unstageable: Secondary | ICD-10-CM | POA: Diagnosis not present

## 2020-10-08 DIAGNOSIS — D510 Vitamin B12 deficiency anemia due to intrinsic factor deficiency: Secondary | ICD-10-CM | POA: Diagnosis not present

## 2020-10-08 DIAGNOSIS — E440 Moderate protein-calorie malnutrition: Secondary | ICD-10-CM | POA: Diagnosis not present

## 2020-10-08 NOTE — Telephone Encounter (Signed)
Caller: [facility]  Call Back @ [phone]  Called back to speak with Marta Antu.

## 2020-10-08 NOTE — Telephone Encounter (Signed)
Medication:  oxyCODONE-acetaminophen (PERCOCET) 10-325 MG tablet   Has the patient contacted their pharmacy? No. (If no, request that the patient contact the pharmacy for the refill.) (If yes, when and what did the pharmacy advise?)  Preferred Pharmacy (with phone number or street name):  [facility], [place], [place] [address]  Phone:  [phone] Fax:  [phone]  DEA #:  --  DAW Reason: --     Agent: Please be advised that RX refills may take up to 3 business days. We ask that you follow-up with your pharmacy.

## 2020-10-08 NOTE — Telephone Encounter (Signed)
[facility] Caller # [phone]  would like verbal order for home care 2 time a week for 4 weeks   Please advice

## 2020-10-08 NOTE — Telephone Encounter (Signed)
Lvm to call back

## 2020-10-10 DIAGNOSIS — D510 Vitamin B12 deficiency anemia due to intrinsic factor deficiency: Secondary | ICD-10-CM | POA: Diagnosis not present

## 2020-10-10 DIAGNOSIS — F418 Other specified anxiety disorders: Secondary | ICD-10-CM | POA: Diagnosis not present

## 2020-10-10 DIAGNOSIS — L89154 Pressure ulcer of sacral region, stage 4: Secondary | ICD-10-CM | POA: Diagnosis not present

## 2020-10-10 DIAGNOSIS — E440 Moderate protein-calorie malnutrition: Secondary | ICD-10-CM | POA: Diagnosis not present

## 2020-10-10 DIAGNOSIS — N390 Urinary tract infection, site not specified: Secondary | ICD-10-CM | POA: Diagnosis not present

## 2020-10-10 DIAGNOSIS — L89620 Pressure ulcer of left heel, unstageable: Secondary | ICD-10-CM | POA: Diagnosis not present

## 2020-10-11 ENCOUNTER — Other Ambulatory Visit: Payer: Self-pay

## 2020-10-11 ENCOUNTER — Ambulatory Visit (INDEPENDENT_AMBULATORY_CARE_PROVIDER_SITE_OTHER): Payer: Medicare Other | Admitting: Medical

## 2020-10-11 VITALS — BP 123/73 | HR 88 | Resp 20 | Ht 64.0 in | Wt 108.0 lb

## 2020-10-11 DIAGNOSIS — R300 Dysuria: Secondary | ICD-10-CM

## 2020-10-11 DIAGNOSIS — M79671 Pain in right foot: Secondary | ICD-10-CM

## 2020-10-11 DIAGNOSIS — D649 Anemia, unspecified: Secondary | ICD-10-CM

## 2020-10-11 DIAGNOSIS — M25551 Pain in right hip: Secondary | ICD-10-CM

## 2020-10-11 DIAGNOSIS — M533 Sacrococcygeal disorders, not elsewhere classified: Secondary | ICD-10-CM

## 2020-10-11 DIAGNOSIS — Z79899 Other long term (current) drug therapy: Secondary | ICD-10-CM

## 2020-10-11 DIAGNOSIS — L98429 Non-pressure chronic ulcer of back with unspecified severity: Secondary | ICD-10-CM

## 2020-10-11 DIAGNOSIS — T148XXD Other injury of unspecified body region, subsequent encounter: Secondary | ICD-10-CM

## 2020-10-11 DIAGNOSIS — G8929 Other chronic pain: Secondary | ICD-10-CM | POA: Diagnosis not present

## 2020-10-11 LAB — CBC WITH DIFFERENTIAL/PLATELET
Basophils Absolute: 0 10*3/uL (ref 0.0–0.1)
Basophils Relative: 0.4 % (ref 0.0–3.0)
Eosinophils Absolute: 0.2 10*3/uL (ref 0.0–0.7)
Eosinophils Relative: 3.4 % (ref 0.0–5.0)
HCT: 31.4 % — ABNORMAL LOW (ref 36.0–46.0)
Hemoglobin: 10 g/dL — ABNORMAL LOW (ref 12.0–15.0)
Lymphocytes Relative: 20 % (ref 12.0–46.0)
Lymphs Abs: 1 10*3/uL (ref 0.7–4.0)
MCHC: 32 g/dL (ref 30.0–36.0)
MCV: 111.8 fl — ABNORMAL HIGH (ref 78.0–100.0)
Monocytes Absolute: 0.3 10*3/uL (ref 0.1–1.0)
Monocytes Relative: 6.9 % (ref 3.0–12.0)
Neutro Abs: 3.5 10*3/uL (ref 1.4–7.7)
Neutrophils Relative %: 69.3 % (ref 43.0–77.0)
Platelets: 918 10*3/uL — ABNORMAL HIGH (ref 150.0–400.0)
RBC: 2.81 Mil/uL — ABNORMAL LOW (ref 3.87–5.11)
RDW: 29.8 % — ABNORMAL HIGH (ref 11.5–15.5)
WBC: 5 10*3/uL (ref 4.0–10.5)

## 2020-10-11 LAB — POC URINALSYSI DIPSTICK (AUTOMATED)
Bilirubin, UA: NEGATIVE
Blood, UA: NEGATIVE
Glucose, UA: NEGATIVE
Ketones, UA: NEGATIVE
Leukocytes, UA: NEGATIVE
Nitrite, UA: NEGATIVE
Protein, UA: POSITIVE — AB
Spec Grav, UA: 1.025 (ref 1.010–1.025)
Urobilinogen, UA: 0.2 E.U./dL
pH, UA: 6 (ref 5.0–8.0)

## 2020-10-11 LAB — COMPREHENSIVE METABOLIC PANEL
ALT: 19 U/L (ref 0–35)
AST: 23 U/L (ref 0–37)
Albumin: 3.6 g/dL (ref 3.5–5.2)
Alkaline Phosphatase: 87 U/L (ref 39–117)
BUN: 11 mg/dL (ref 6–23)
CO2: 31 mEq/L (ref 19–32)
Calcium: 9.6 mg/dL (ref 8.4–10.5)
Chloride: 101 mEq/L (ref 96–112)
Creatinine, Ser: 0.53 mg/dL (ref 0.40–1.20)
GFR: 94.84 mL/min (ref >60.00)
Glucose, Bld: 83 mg/dL (ref 70–99)
Potassium: 4.7 mEq/L (ref 3.5–5.1)
Sodium: 138 mEq/L (ref 135–145)
Total Bilirubin: 0.5 mg/dL (ref 0.2–1.2)
Total Protein: 7.3 g/dL (ref 6.0–8.3)

## 2020-10-11 MED ORDER — OXYCODONE-ACETAMINOPHEN 10-325 MG PO TABS: 1.0000 | ORAL_TABLET | Freq: Four times a day (QID) | ORAL | 0 refills | Status: AC | PRN

## 2020-10-11 NOTE — Telephone Encounter (Signed)
done

## 2020-10-11 NOTE — Progress Notes (Signed)
Subjective:    Patient ID: Rachael Johnson, female    DOB: 06-25-1951, 70 y.o.   MRN: [id number]  HPI  Pt in for follow up.   After visit summary below.  "Recommendations for Outpatient Follow-up:  Make sure patient has follow-up with hematology oncology as instructed Follow complete resolution of patient urinary symptoms Continue outpatient wound care.  Discharge Diagnoses:  Principal Problem:   Symptomatic anemia Active Problems:   Hypothyroid   Depression with anxiety   Macrocytic anemia   Hyperlipidemia   Dysuria   Essential hypertension   COPD (chronic obstructive pulmonary disease) (HCC)   Acute lower UTI   Pressure injury of skin   Malnutrition of moderate degree"  Rt hip, upper buttock/sacral area and rt heel. Family shows me pictures. 3.5 inch wide in hip. Sacral area 3.5 inch wide and also deep. Also wound on rt heel.  Hospital Course:  Anemia-acute on chronic anemia Symptomatic anemia -History of chronic anemia due to iron deficiency B12 deficiency on supplements no signs of bleeding -Per patient son has borderline leukemia-myelofibrosisfollows with oncologist Dr.Ennever She has had blood transfusion in the past 2 to 3 years,last blood transfusion approx a month ago. Electronic records also recorded iron deficiency anemia, B12 deficiency anemia in the past. -Hemoglobin 6.2>>>2 units of PRBC blood transfusion>>9.1 at discharge. -Iron studies reviewed:Total iron normal at 88, TIBC elevated 151, ferritin 1060, -Folate normal at 9.7, B12 normal at 479(patient is on supplemental folate,B12 shots) -Hemoccult negative. -No active signs of bleeding -Continue outpatient follow-up with hematologist/oncologist.  Dysuria-possible acute cystitis -reports improvement in dysuria -continue cefdinir, diflucan and pyridium as instructed -advised to maintained adequate hydration.  SARS-CoV-2 positive -Patient had a recent history of SARS-CoV-2 infection 3  weeks ago, also vaccinated -Remained asymptomatic, no upper respiratory symptoms -Withholding treatments at this point -Currently satting 98% on room air  History of recent hospitalizationswith pneumonia-currently stable in no respiratory distress, afebrile and normotensive. -No requiring oxygen supplementation.  Hypothyroid-continue Synthroid  Depression with anxiety -continue as needed Xanax -Resume the use of Effexor. -No suicidal ideation hallucination.  Macrocytic anemia -Continue folic acid and monthly B12 injections.  Hyperlipidemia-continue statins  Essential hypertension -Stable and well-controlled. -Continue monitoring sodium intake -Continue as needed Lasix.  COPD(chronic obstructive pulmonary disease) (HCC)  -No wheezing, no complaints of shortness of breath. -continue home inhalers/nebulizer bronchodilators regimen and resume the use of Singulair.   History of myelofibrosis-h/o ?leukemia/thrombocytopenia, leukopenia, anemia - -outpatient follow-up with Dr. Marin Olp -Records indicate previously patient [facility] on [facility] given the history of myelofibrosis -LovenoxforDVT prophylaxis in the setting of myelofibrosis-on hold with ongoing anemia.  -Discussed with oncology (curbside) consultation records reviewed, currently not recommending any anticoagulation therapy. Further discussion at follow up visit as an outpatient.   11 days ago urine culture. Showed   <10,000 COLONIES/mL INSIGNIFICANT GROWTH   Urinary symptoms much improved now.Pt urine still burns just little bit. Very residual.  Review of Systems  Constitutional: Negative for chills, fatigue and fever.  HENT: Negative for congestion and ear pain.   Respiratory: Negative for cough, chest tightness, shortness of breath and wheezing.   Cardiovascular: Negative for chest pain and palpitations.  Gastrointestinal: Negative for abdominal  pain.  Genitourinary: Positive for dysuria. Negative for decreased urine volume, frequency, hematuria, pelvic pain and vaginal pain.  Musculoskeletal: Negative for back pain and joint swelling.  Skin: Negative for rash.  Neurological: Negative for dizziness, numbness and headaches.  Hematological: Negative for adenopathy. Does not  bruise/bleed easily.  Psychiatric/Behavioral: Negative for behavioral problems, decreased concentration, sleep disturbance and suicidal ideas. The patient is not nervous/anxious.     Past Medical History:  Diagnosis Date  . Acute renal insufficiency 04/09/2017  . Anemia, iron deficiency 07/08/2012  . Arthritis   . Cataract 12/21/2014  . COPD (chronic obstructive pulmonary disease) ([facility]) 01/23/2017   01/23/2017   Walked RA  2 laps @ 185 ft each stopped due to  Cough > > sob/ no desat nl pace  . Depression with anxiety 03/24/2011  . Diverticulitis   . Esophageal reflux 08/17/2013  . Essential thrombocythemia ([facility]) 01/24/2011  . Frequent episodic tension-type headache   . Gastroenteritis 12/21/2014  . 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 GERD (gastroesophageal reflux disease)   . H. pylori infection 12/19/2012  . Hyperglycemia 12/17/2015  . Hypertension   . Iron deficiency anemia due to chronic blood loss 04/03/2017  . Osteoarthritis 05/24/2017  . Other and unspecified hyperlipidemia 02/25/2013  . Restless leg syndrome 09/30/2014  . Rhabdomyolysis 02/2017  . Scabies 03/28/2015  . 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 Seizure ([facility])    childhood  . Shoulder wound, right, sequela 03/14/2017  . Thyroid disease      Social History   Socioeconomic History  . Marital status: Widowed    Spouse name: Not on file  . Number of children: 2  . Years of education: Not on file  . Highest education level: Not on file  Occupational History  . Occupation: APL IT sales professional: [employer]    Comment: [employer]  Tobacco Use  . Smoking status: Former Smoker    Packs/day: 3.50     Years: 20.00    Pack years: 70.00    Types: Cigarettes    Start date: 11/15/1970    Quit date: 07/31/1990    Years since quitting: 30.2  . Smokeless tobacco: Never Used  . Tobacco comment: quit 23 years ago  Vaping Use  . Vaping Use: Never used  Substance and Sexual Activity  . Alcohol use: No    Alcohol/week: 0.0 standard drinks  . Drug use: No  . Sexual activity: Never  Other Topics Concern  . Not on file  Social History Narrative   Regular exercise: no   Caffeine Use: 2-3 weekly   Social Determinants of Health   Financial Resource Strain: Low Risk   . Difficulty of Paying Living Expenses: Not hard at all  Food Insecurity: No Food Insecurity  . Worried About Charity fundraiser in the Last Year: Never true  . Ran Out of Food in the Last Year: Never true  Transportation Needs: No Transportation Needs  . Lack of Transportation (Medical): No  . Lack of Transportation (Non-Medical): No  Physical Activity: Not on file  Stress: Not on file  Social Connections: Not on file  Intimate Partner Violence: Not on file    Past Surgical History:  Procedure Laterality Date  . ABDOMINAL HYSTERECTOMY     1992  . BREAST BIOPSY    . BREAST CYST ASPIRATION    . BREAST SURGERY  1992   biopsy, benign. Fibrocystic  . UMBILICAL HERNIA REPAIR N/A 09/25/2012   Procedure: HERNIA REPAIR UMBILICAL ADULT;  Surgeon: Harl Bowie, MD;  Location: [facility];  Service: General;  Laterality: N/A;    Family History  Problem Relation Age of Onset  . Arthritis Mother   . Cancer Mother        ovarian  . Hypertension Mother   .  Heart disease Mother        pacer  . Heart failure Mother   . Arthritis Father   . Cancer Father        lung  . Hypertension Father   . Cancer Sister        lung  . Cancer Brother        prostate  . Cancer Brother        lung  . Hypertension Son   . COPD Brother   . Heart disease Brother        died from CHF  . Hypertension Brother   . Cancer Brother         colon  . Alcohol abuse Brother   . Cirrhosis Brother   . Seizures Sister   . Stroke Sister   . Arthritis Brother     No Known Allergies  Current Outpatient Medications on File Prior to Visit  Medication Sig Dispense Refill  . albuterol (VENTOLIN HFA) 108 (90 Base) MCG/ACT inhaler INHALE 2 PUFFS BY MOUTH EVERY 6 HOURS AS NEEDED FOR WHEEZING OR SHORTNESS OF BREATH 54 g 0  . ALPRAZolam (XANAX) 1 MG tablet TAKE 1 TABLET BY MOUTH THREE TIMES DAILY AS NEEDED FOR ANXIETY 90 tablet 2  . alum & mag hydroxide-simeth (GELUSIL) 992-426-83 MG chewable tablet Chew by mouth.    . Amino Acids-Protein Hydrolys (FEEDING SUPPLEMENT, PRO-STAT SUGAR FREE 64,) LIQD Take 30 mLs by mouth 3 (three) times daily with meals.    Marland Kitchen atorvastatin (LIPITOR) 10 MG tablet Take 1 tablet (10 mg total) by mouth daily. 30 tablet 5  . collagenase (SANTYL) ointment Apply topically 2 (two) times daily. Apply twice a day to sacral decubitus ulcer. 15 g   . estradiol (ESTRACE) 0.5 MG tablet Take 1 tablet (0.5 mg total) by mouth 2 (two) times daily. 180 tablet 1  . famotidine (PEPCID) 40 MG tablet Take 40 mg by mouth daily.    . fluticasone furoate-vilanterol (BREO ELLIPTA) 100-25 MCG/INH AEPB Inhale 1 puff into the lungs daily. 60 each 1  . folic acid (FOLVITE) 419 MCG tablet Take 400 mcg by mouth daily.    . hydroxyurea (HYDREA) 500 MG capsule Take 500 mg by mouth at bedtime. May take with food to minimize GI side effects.    Marland Kitchen ipratropium-albuterol (DUONEB) 0.5-2.5 (3) MG/3ML SOLN Take 3 mLs by nebulization every 4 (four) hours as needed.    Marland Kitchen levothyroxine (SYNTHROID) 125 MCG tablet Take 125 mcg by mouth daily before breakfast.    . montelukast (SINGULAIR) 10 MG tablet Take 1 tablet (10 mg total) by mouth at bedtime. 90 tablet 3  . Multiple Vitamin (MULTIVITAMIN PO) Take 1 tablet by mouth every morning.    . ondansetron (ZOFRAN) 8 MG tablet Take 0.5-1 tablets (4-8 mg total) by mouth every 8 (eight) hours as needed for nausea or  vomiting. 90 tablet 2  . oxyCODONE-acetaminophen (PERCOCET) 10-325 MG tablet Take 1 tablet by mouth every 8 (eight) hours as needed for pain (severe pain). 75 tablet 0  . thiamine 100 MG tablet Take 100 mg by mouth daily.    . traZODone (DESYREL) 100 MG tablet Take 1 tablet (100 mg total) by mouth at bedtime as needed for sleep. 20 tablet 2  . acetaminophen (TYLENOL) 325 MG tablet Take 2 tablets (650 mg total) by mouth every 6 (six) hours as needed for mild pain or headache (or Fever >/= 101). (Patient not taking: No sig reported)    . cyanocobalamin (,  VITAMIN B-12,) 1000 MCG/ML injection Inject 1 ml into muscle q week x 4 then q monthly (Patient not taking: No sig reported) 10 mL 2  . furosemide (LASIX) 20 MG tablet TAKE 1 TABLET BY MOUTH THREE TIMES DAILY AS NEEDED FOR EDEMA (Patient not taking: No sig reported) 90 tablet 10  . linaclotide (LINZESS) 145 MCG CAPS capsule Take 1 capsule (145 mcg total) by mouth daily before breakfast. (Patient not taking: No sig reported)    . venlafaxine XR (EFFEXOR-XR) 75 MG 24 hr capsule TAKE 1 CAPSULE BY MOUTH THREE TIMES DAILY (Patient not taking: No sig reported) 90 capsule 10   No current facility-administered medications on file prior to visit.    BP 123/73   Pulse 88   Resp 20   Ht 5\' 4"  (1.626 m)   Wt 108 lb (49 kg)   LMP 07/31/1990   SpO2 99%   BMI 18.54 kg/m       Objective:   Physical Exam  General Mental Status- Alert. General Appearance- Not in acute distress.   Skin General: Color- Normal Color. Moisture- Normal Moisture.  Neck Carotid Arteries- Normal color. Moisture- Normal Moisture. No carotid bruits. No JVD.  Chest and Lung Exam Auscultation: Breath Sounds:-Normal.  Cardiovascular Auscultation:Rythm- Regular. Murmurs & Other Heart Sounds:Auscultation of the heart reveals- No Murmurs.  Abdomen Inspection:-Inspeection Normal. Palpation/Percussion:Note:No mass. Palpation and Percussion of the abdomen reveal- Non Tender,  Non Distended + BS, no rebound or guarding.    Neurologic Cranial Nerve exam:- CN III-XII intact(No nystagmus), symmetric smile. Strength:- 5/5 equal and symmetric strength both upper and lower extremities.  Skin- viewed pictures of sacrum, hip and heel last week. In person exam deferred by pt due to pain when dressing changed.     Assessment & Plan:  History of anemia and recent blood transfusion about 1 month ago.  Will repeat CBC and iron level today.  History of UTI recently and was given antibiotics.  Symptoms have improved with only residual dysuria.  No obvious indication of UTI on UA.  Will follow urine culture.  Recent sacral ulcer with ulcer also present on right hip and the right heel.  Wound care is coming out and we had discussion on possible wound VAC use.  Masking wound care home health to send updated wound care note to review and could prescribe the wound VAC.  History of chronic pain.  Worsened recently with ulcerations and wound changes.  Prescribed oxycodone today 10/325 number 60 tablets use 1 tablet every 6 hours as needed severe pain.  Patient's giving her UDS today.  When that is back we will discuss with PCP and update medication contract  History of COVID.  Diagnosed on September 30, 2020.  Oxygen 99% today.  Lungs clear.  On review does not have any complications with Covid.  HTN hx. bp well controlled.  Follow up in 2 weeks or as needed.  Time spent with patient today was 40 +  minutes which consisted of chart review, discussing diagnoses, work up, treatment and documentation.

## 2020-10-11 NOTE — Telephone Encounter (Signed)
Pt will be seen by Percell Miller today

## 2020-10-11 NOTE — Patient Instructions (Addendum)
History of anemia and recent blood transfusion about 1 month ago.  Will repeat CBC and iron level today.  History of UTI recently and was given antibiotics.  Symptoms have improved with only residual dysuria.  No obvious indication of UTI on UA.  Will follow urine culture.  Recent sacral ulcer with ulcer also present on right hip and the right heel.  Wound care is coming out and we had discussion on possible wound VAC use.  Masking wound care home health to send updated wound care note to review and could prescribe the wound VAC.  History of chronic pain.  Worsened recently with ulcerations and wound changes.  Prescribed oxycodone today 10/325 number 60 tablets use 1 tablet every 6 hours as needed severe pain.  Patient's giving her UDS today.  When that is back we will discuss with PCP and update medication contract  History of COVID.  Diagnosed on September 30, 2020.  Oxygen 99% today.  Lungs clear.  On review does not have any complications with Covid.  HTN hx. bp well controlled.  Follow up in 2 weeks or as needed.

## 2020-10-12 ENCOUNTER — Telehealth: Payer: Self-pay | Admitting: Medical

## 2020-10-12 DIAGNOSIS — E440 Moderate protein-calorie malnutrition: Secondary | ICD-10-CM | POA: Diagnosis not present

## 2020-10-12 DIAGNOSIS — F418 Other specified anxiety disorders: Secondary | ICD-10-CM | POA: Diagnosis not present

## 2020-10-12 DIAGNOSIS — D510 Vitamin B12 deficiency anemia due to intrinsic factor deficiency: Secondary | ICD-10-CM | POA: Diagnosis not present

## 2020-10-12 DIAGNOSIS — D7581 Myelofibrosis: Secondary | ICD-10-CM

## 2020-10-12 DIAGNOSIS — D649 Anemia, unspecified: Secondary | ICD-10-CM

## 2020-10-12 DIAGNOSIS — L89620 Pressure ulcer of left heel, unstageable: Secondary | ICD-10-CM | POA: Diagnosis not present

## 2020-10-12 DIAGNOSIS — L89154 Pressure ulcer of sacral region, stage 4: Secondary | ICD-10-CM | POA: Diagnosis not present

## 2020-10-12 DIAGNOSIS — N390 Urinary tract infection, site not specified: Secondary | ICD-10-CM | POA: Diagnosis not present

## 2020-10-12 LAB — IRON,TIBC AND FERRITIN PANEL
%SAT: 37 % (calc) (ref 16–45)
Ferritin: 1477 ng/mL — ABNORMAL HIGH (ref 16–288)
Iron: 77 ug/dL (ref 45–160)
TIBC: 210 mcg/dL (calc) — ABNORMAL LOW (ref 250–450)

## 2020-10-12 LAB — URINE CULTURE
MICRO NUMBER:: 11643378
SPECIMEN QUALITY:: ADEQUATE

## 2020-10-12 NOTE — Telephone Encounter (Signed)
Referral to hematologist placed. °

## 2020-10-13 ENCOUNTER — Telehealth: Payer: Self-pay | Admitting: Family Medicine

## 2020-10-13 LAB — DRUG MONITORING, PANEL 8 WITH CONFIRMATION, URINE
6 Acetylmorphine: NEGATIVE ng/mL (ref <10)
Alcohol Metabolites: NEGATIVE ng/mL
Alphahydroxyalprazolam: 361 ng/mL — ABNORMAL HIGH (ref <25)
Alphahydroxymidazolam: NEGATIVE ng/mL (ref <50)
Alphahydroxytriazolam: NEGATIVE ng/mL (ref <50)
Aminoclonazepam: NEGATIVE ng/mL (ref <25)
Amphetamines: NEGATIVE ng/mL (ref <500)
Benzodiazepines: POSITIVE ng/mL — AB (ref <100)
Buprenorphine, Urine: NEGATIVE ng/mL (ref <5)
Cocaine Metabolite: NEGATIVE ng/mL (ref <150)
Creatinine: 107 mg/dL
Hydroxyethylflurazepam: NEGATIVE ng/mL (ref <50)
Lorazepam: NEGATIVE ng/mL (ref <50)
MDMA: NEGATIVE ng/mL (ref <500)
Marijuana Metabolite: NEGATIVE ng/mL (ref <20)
Nordiazepam: NEGATIVE ng/mL (ref <50)
Opiates: NEGATIVE ng/mL (ref <100)
Oxazepam: NEGATIVE ng/mL (ref <50)
Oxidant: NEGATIVE ug/mL
Oxycodone: NEGATIVE ng/mL (ref <100)
Temazepam: NEGATIVE ng/mL (ref <50)
pH: 5.9 (ref 4.5–9.0)

## 2020-10-13 LAB — DM TEMPLATE

## 2020-10-13 NOTE — Telephone Encounter (Signed)
Caller: [place]  Call Back @ [phone]  Patient is requesting a refill of appetite stimulant prescribed to her. Patient states she can not remember the name but knows it begins with an M.   Please advise

## 2020-10-13 NOTE — Telephone Encounter (Signed)
Patient's daughter called back to say the name of the med is Megestrol. She states she was given that at the [facility] back in November. She would prefer Pill instead of liquid if possible.

## 2020-10-13 NOTE — Telephone Encounter (Signed)
I reviewed her meds and cannot see where I have ever given a appetite stimulant. Please check with her about where she got it and what it was or where she filled it so I can decide if I can prescribe. I have not seen her in quite some time so we need an appt soon. Can be virtual

## 2020-10-13 NOTE — Telephone Encounter (Signed)
Please advise 

## 2020-10-14 ENCOUNTER — Other Ambulatory Visit: Payer: Self-pay | Admitting: Family Medicine

## 2020-10-14 ENCOUNTER — Other Ambulatory Visit: Payer: Self-pay

## 2020-10-14 ENCOUNTER — Encounter (HOSPITAL_COMMUNITY): Payer: Self-pay | Admitting: Emergency Medicine

## 2020-10-14 ENCOUNTER — Emergency Department (HOSPITAL_COMMUNITY)
Admission: EM | Admit: 2020-10-14 | Discharge: 2020-10-29 | Disposition: E | Payer: Medicare Other | Attending: Emergency Medicine | Admitting: Emergency Medicine

## 2020-10-14 DIAGNOSIS — R404 Transient alteration of awareness: Secondary | ICD-10-CM | POA: Diagnosis not present

## 2020-10-14 DIAGNOSIS — E039 Hypothyroidism, unspecified: Secondary | ICD-10-CM | POA: Insufficient documentation

## 2020-10-14 DIAGNOSIS — R0902 Hypoxemia: Secondary | ICD-10-CM | POA: Diagnosis not present

## 2020-10-14 DIAGNOSIS — Z79899 Other long term (current) drug therapy: Secondary | ICD-10-CM | POA: Insufficient documentation

## 2020-10-14 DIAGNOSIS — J449 Chronic obstructive pulmonary disease, unspecified: Secondary | ICD-10-CM | POA: Insufficient documentation

## 2020-10-14 DIAGNOSIS — Z743 Need for continuous supervision: Secondary | ICD-10-CM | POA: Diagnosis not present

## 2020-10-14 DIAGNOSIS — I469 Cardiac arrest, cause unspecified: Secondary | ICD-10-CM | POA: Diagnosis not present

## 2020-10-14 DIAGNOSIS — I10 Essential (primary) hypertension: Secondary | ICD-10-CM | POA: Insufficient documentation

## 2020-10-14 DIAGNOSIS — L89154 Pressure ulcer of sacral region, stage 4: Secondary | ICD-10-CM | POA: Diagnosis not present

## 2020-10-14 DIAGNOSIS — Z85828 Personal history of other malignant neoplasm of skin: Secondary | ICD-10-CM | POA: Diagnosis not present

## 2020-10-14 DIAGNOSIS — Z7952 Long term (current) use of systemic steroids: Secondary | ICD-10-CM | POA: Insufficient documentation

## 2020-10-14 DIAGNOSIS — E440 Moderate protein-calorie malnutrition: Secondary | ICD-10-CM | POA: Diagnosis not present

## 2020-10-14 DIAGNOSIS — Z87891 Personal history of nicotine dependence: Secondary | ICD-10-CM | POA: Insufficient documentation

## 2020-10-14 DIAGNOSIS — L89620 Pressure ulcer of left heel, unstageable: Secondary | ICD-10-CM | POA: Diagnosis not present

## 2020-10-14 DIAGNOSIS — N390 Urinary tract infection, site not specified: Secondary | ICD-10-CM | POA: Diagnosis not present

## 2020-10-14 DIAGNOSIS — D510 Vitamin B12 deficiency anemia due to intrinsic factor deficiency: Secondary | ICD-10-CM | POA: Diagnosis not present

## 2020-10-14 DIAGNOSIS — I499 Cardiac arrhythmia, unspecified: Secondary | ICD-10-CM | POA: Diagnosis not present

## 2020-10-14 DIAGNOSIS — I959 Hypotension, unspecified: Secondary | ICD-10-CM | POA: Diagnosis not present

## 2020-10-14 DIAGNOSIS — F418 Other specified anxiety disorders: Secondary | ICD-10-CM | POA: Diagnosis not present

## 2020-10-14 MED ORDER — EPINEPHRINE 1 MG/10ML IJ SOSY
PREFILLED_SYRINGE | INTRAMUSCULAR | Status: AC | PRN
  Administered 2020-10-14: 1 mg via INTRAVENOUS

## 2020-10-14 MED ORDER — ALPRAZOLAM 0.5 MG PO TABS
0.5000 mg | ORAL_TABLET | Freq: Three times a day (TID) | ORAL | 0 refills | Status: AC | PRN
Start: 2020-10-14 — End: ?

## 2020-10-15 ENCOUNTER — Telehealth: Payer: Self-pay

## 2020-10-15 ENCOUNTER — Telehealth: Payer: Self-pay | Admitting: Internal Medicine

## 2020-10-15 NOTE — Telephone Encounter (Signed)
Pt deceased as of 2020/10/24

## 2020-10-15 NOTE — Telephone Encounter (Signed)
Pt deceased as of 11/06/2020
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
## 2020-10-15 NOTE — Telephone Encounter (Signed)
Who Is Calling Physician / Provider / Hospital Call Type Provider Call [facility] Page Now Reason for Call Request to speak to Physician Initial Comment Caller calling from [facility] ER regarding a patient of hers. Patient Name Rachael Johnson Patient DOB [birth year] Requesting Provider [place] Physician Number [phone] [facility] Room Number Trauma A Disp. Time Disposition Final User 10/18/2020 8:58:16 PM Send to [place], [place] 2020/10/18 9:06:23 PM Called On-Call Provider Buckner, Britton 2020/10/18 9:07:01 PM Page Completed Richardson Dopp, Stefanie Paging DoctorName Phone DateTime Result/Outcome Message Type Notes Pricilla Holm- MD [phone] 10-18-20 9:06:23 PM Called On Call Provider - Reached Doctor Paged Pricilla Holm- MD October 18, 2020 9:06:27 PM Spoke with On Call - General Message Result Call Closed By: Corey Harold Transaction Date/Time: 2020-10-18 8:54:21 PM (ET)

## 2020-10-15 NOTE — Telephone Encounter (Signed)
Thanks, please start a condolence card from the office.

## 2020-10-18 ENCOUNTER — Telehealth: Payer: Self-pay | Admitting: *Deleted

## 2020-10-28 ENCOUNTER — Encounter: Payer: Self-pay | Admitting: *Deleted

## 2020-10-28 NOTE — Progress Notes (Signed)
Dr Marin Olp asked me to check on status of patient since he hadnt seen her in a while.  Notified Dr Marin Olp of patients passing based on chart information.

## 2020-10-29 ENCOUNTER — Other Ambulatory Visit (HOSPITAL_COMMUNITY): Payer: Self-pay
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
## 2020-10-29 NOTE — Addendum Note (Signed)
Addended by: Anabel Halon on: 10-19-20 06:45 PM   Modules accepted: Orders

## 2020-10-29 NOTE — Code Documentation (Signed)
Patient time of death occurred at 2038.

## 2020-10-29 NOTE — Telephone Encounter (Signed)
Please let her know I sent in some Alprazolam 0.5 mg instead of 1 mg since we had to increase her oxycodone and we cannot mix them at higher doses and not take too many risks.

## 2020-10-29 NOTE — Progress Notes (Signed)
   10-15-20 2200  Clinical Encounter Type  Visited With Patient and family together  Visit Type Death  Referral From Nurse  Consult/Referral To Chaplain  Chaplain responded to page. I spoke with Nurse, Lenna Sciara. She stated the patient's son is in consultation room A and the doctor would like for a Chaplain to be present when he tells the son that his mother has died. I provided the ministry of presence, comfort and prayer to the son, [name]. He stated it would be a hard conversation telling his sister, wife and four grandchildren. I gave him a Patient Placement card to call. Joey stated the patient had made pre-arrangement with [facility], [place], [place].  He stated he would call the funeral home tonight to pick up his mother in the morning. I escorted the patient to see his mother. I gave him the bag of personal belongings and escorted him to the exit of ED when he was ready to leave. This note was prepared by Jeanine Luz, M.Div..  For questions please contact me by phone [phone].

## 2020-10-29 NOTE — Telephone Encounter (Signed)
Requesting: alprazolam 1mg  Contract: 04/15/2019 UDS: 10/11/2020 Last Visit: 10/11/2020 Next Visit: 03/15/2021 Last Refill: 07/16/2020 #90 and 2RF  Please Advise

## 2020-10-29 NOTE — ED Provider Notes (Signed)
[facility] EMERGENCY DEPARTMENT Provider Note   CSN: [id number] Arrival date & time: November 11, 2020  2035     History Chief Complaint  Patient presents with  . Cardiac Arrest    Rachael Johnson is a 70 y.o. female.  Level 5 caveat due to cardiac arrest.  Patient had witnessed arrest at home.  Was drinking some alcohol in bed with family when she suddenly stopped breathing.  CPR was initiated immediately by family.  She had about 25 to 30 minutes of CPR between family and EMS before they had return of spontaneous circulation.  She had no shockable rhythms.  She was in asystole.  She was given epinephrine several times.  She shortly lost pulses again and became bradycardic and patient was paced by EMS for the last 40 minutes.  However upon arrival she has no pulses.  CPR was reinitiated.  No concern for trauma or other falls.  She has had a lot of medical problems in the past per EMS.  Had a history of cancer.  King airway in place.  The history is provided by the EMS personnel.  Cardiac Arrest Witnessed by:  Family member Incident location:  Home Time since incident:  1 hour Time before ALS initiated:  Immediate Pulse:  Absent Initial cardiac rhythm per EMS:  Asystole Treatments prior to arrival:  ACLS protocol      Past Medical History:  Diagnosis Date  . Acute renal insufficiency 04/09/2017  . Anemia, iron deficiency 07/08/2012  . Arthritis   . Cataract 12/21/2014  . COPD (chronic obstructive pulmonary disease) ([place]) 01/23/2017   01/23/2017   Walked RA  2 laps @ 185 ft each stopped due to  Cough > > sob/ no desat nl pace  . Depression with anxiety 03/24/2011  . Diverticulitis   . Esophageal reflux 08/17/2013  . Essential thrombocythemia ([place]) 01/24/2011  . Frequent episodic tension-type headache   . Gastroenteritis 12/21/2014  . 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 GERD (gastroesophageal reflux disease)   . H. pylori infection 12/19/2012  . Hyperglycemia 12/17/2015  .  Hypertension   . Iron deficiency anemia due to chronic blood loss 04/03/2017  . Osteoarthritis 05/24/2017  . Other and unspecified hyperlipidemia 02/25/2013  . Restless leg syndrome 09/30/2014  . Rhabdomyolysis 02/2017  . Scabies 03/28/2015  . 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 Seizure ([place])    childhood  . Shoulder wound, right, sequela 03/14/2017  . Thyroid disease     Patient Active Problem List   Diagnosis Date Noted  . Malnutrition of moderate degree 10/01/2020  . Symptomatic anemia 09/30/2020  . Acute lower UTI 09/30/2020  . Pressure injury of skin 09/30/2020  . Severe protein-calorie malnutrition ([place])   . Adult failure to thrive   . Weakness   . Acute encephalopathy   . Palliative care by specialist   . Osteomyelitis ([place]) 04/17/2020  . Pressure injury of sacral region, stage 4 ([place]) 04/17/2020  . Decubitus ulcer of left heel, unstageable ([place]) 04/17/2020  . Pernicious anemia 10/31/2019  . Hypokalemia 10/16/2019  . Insomnia 02/05/2019  . Urinary frequency 06/19/2018  . 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 Sinusitis 11/05/2017  . Chronic back pain 09/09/2017  . Osteoarthritis 05/24/2017  . Iron deficiency anemia due to chronic blood loss 04/03/2017  . COPD (chronic obstructive pulmonary disease) ([place]) 01/23/2017  . Vitamin D deficiency 03/07/2016  . Dysuria 03/07/2016  . Essential hypertension 03/07/2016  . Benign paroxysmal positional vertigo 03/07/2016  . Hyperglycemia 12/17/2015  .  Cataract 12/21/2014  . Restless leg syndrome 09/30/2014  . Hyperlipidemia 02/25/2013  . Umbilical hernia 33/29/5188  . Macrocytic anemia 07/08/2012  . Weight loss 12/29/2011  . Depression with anxiety 03/24/2011  . 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 Hypothyroid 02/06/2011  . Thrombocythemia 01/24/2011    Past Surgical History:  Procedure Laterality Date  . ABDOMINAL HYSTERECTOMY     1992  . BREAST BIOPSY    . BREAST CYST ASPIRATION    . BREAST SURGERY  1992   biopsy,  benign. Fibrocystic  . UMBILICAL HERNIA REPAIR N/A 09/25/2012   Procedure: HERNIA REPAIR UMBILICAL ADULT;  Surgeon: Harl Bowie, MD;  Location: [facility] ORS;  Service: General;  Laterality: N/A;     OB History    Gravida  3   Para  2   Term      Preterm      AB  1   Living  2     SAB  1   IAB      Ectopic      Multiple      Live Births              Family History  Problem Relation Age of Onset  . Arthritis Mother   . Cancer Mother        ovarian  . Hypertension Mother   . Heart disease Mother        pacer  . Heart failure Mother   . Arthritis Father   . Cancer Father        lung  . Hypertension Father   . Cancer Sister        lung  . Cancer Brother        prostate  . Cancer Brother        lung  . Hypertension Son   . COPD Brother   . Heart disease Brother        died from CHF  . Hypertension Brother   . Cancer Brother        colon  . Alcohol abuse Brother   . Cirrhosis Brother   . Seizures Sister   . Stroke Sister   . Arthritis Brother     Social History   Tobacco Use  . Smoking status: Former Smoker    Packs/day: 3.50    Years: 20.00    Pack years: 70.00    Types: Cigarettes    Start date: 11/15/1970    Quit date: 07/31/1990    Years since quitting: 30.2  . Smokeless tobacco: Never Used  . Tobacco comment: quit 23 years ago  Vaping Use  . Vaping Use: Never used  Substance Use Topics  . Alcohol use: No    Alcohol/week: 0.0 standard drinks  . Drug use: No    Home Medications Prior to Admission medications   Medication Sig Start Date End Date Taking? Authorizing Provider  albuterol (VENTOLIN HFA) 108 (90 Base) MCG/ACT inhaler INHALE 2 PUFFS BY MOUTH EVERY 6 HOURS AS NEEDED FOR WHEEZING OR SHORTNESS OF BREATH 06/09/19   Mosie Lukes, MD  ALPRAZolam Duanne Moron) 0.5 MG tablet Take 1 tablet (0.5 mg total) by mouth 3 (three) times daily as needed for anxiety. 11-09-20   Mosie Lukes, MD  ALPRAZolam Duanne Moron) 1 MG tablet TAKE 1 TABLET BY  MOUTH THREE TIMES DAILY AS NEEDED FOR ANXIETY 07/16/20   Mosie Lukes, MD  alum & mag hydroxide-simeth (GELUSIL) 200-200-25 MG chewable tablet Chew by mouth. 04/08/19   [provider]  Amino Acids-Protein Hydrolys (FEEDING SUPPLEMENT, PRO-STAT SUGAR FREE 64,) LIQD Take 30 mLs by mouth 3 (three) times daily with meals.    [provider]  atorvastatin (LIPITOR) 10 MG tablet Take 1 tablet (10 mg total) by mouth daily. 05/18/20   Mosie Lukes, MD  collagenase (SANTYL) ointment Apply topically 2 (two) times daily. Apply twice a day to sacral decubitus ulcer. 06/29/20   Mosie Lukes, MD  cyanocobalamin (,VITAMIN B-12,) 1000 MCG/ML injection Inject 1 ml into muscle q week x 4 then q monthly Patient not taking: No sig reported 10/31/19   [name], MD  estradiol (ESTRACE) 0.5 MG tablet Take 1 tablet (0.5 mg total) by mouth 2 (two) times daily. 06/14/20   Mosie Lukes, MD  famotidine (PEPCID) 40 MG tablet Take 40 mg by mouth daily. 03/04/20   [provider]  fluticasone furoate-vilanterol (BREO ELLIPTA) 100-25 MCG/INH AEPB Inhale 1 puff into the lungs daily. 10/03/20   Barton Dubois, MD  folic acid (FOLVITE) 323 MCG tablet Take 400 mcg by mouth daily.    [provider]  furosemide (LASIX) 20 MG tablet TAKE 1 TABLET BY MOUTH THREE TIMES DAILY AS NEEDED FOR EDEMA Patient not taking: No sig reported 08/16/20   Mosie Lukes, MD  hydroxyurea (HYDREA) 500 MG capsule Take 500 mg by mouth at bedtime. May take with food to minimize GI side effects.    [provider]  ipratropium-albuterol (DUONEB) 0.5-2.5 (3) MG/3ML SOLN Take 3 mLs by nebulization every 4 (four) hours as needed.    [provider]  levothyroxine (SYNTHROID) 125 MCG tablet Take 125 mcg by mouth daily before breakfast.    [provider]  linaclotide (LINZESS) 145 MCG CAPS capsule Take 1 capsule (145 mcg total) by mouth daily before breakfast. Patient not taking: No sig  reported 04/22/20   Barton Dubois, MD  montelukast (SINGULAIR) 10 MG tablet Take 1 tablet (10 mg total) by mouth at bedtime. 06/14/20   Mosie Lukes, MD  Multiple Vitamin (MULTIVITAMIN PO) Take 1 tablet by mouth every morning.    [provider]  ondansetron (ZOFRAN) 8 MG tablet Take 0.5-1 tablets (4-8 mg total) by mouth every 8 (eight) hours as needed for nausea or vomiting. 09/20/20   Mosie Lukes, MD  oxyCODONE-acetaminophen (PERCOCET) 10-325 MG tablet Take 1 tablet by mouth every 6 (six) hours as needed for up to 5 days for pain. 10/11/20 10/16/20  Saguier, Percell Miller, PA-C  thiamine 100 MG tablet Take 100 mg by mouth daily.    [provider]  traZODone (DESYREL) 100 MG tablet Take 1 tablet (100 mg total) by mouth at bedtime as needed for sleep. 10/02/20   Barton Dubois, MD  venlafaxine XR (EFFEXOR-XR) 75 MG 24 hr capsule TAKE 1 CAPSULE BY MOUTH THREE TIMES DAILY Patient not taking: No sig reported 08/16/20   Mosie Lukes, MD    Allergies    Patient has no known allergies.  Review of Systems   Review of Systems  Unable to perform ROS: Acuity of condition    Physical Exam Updated Vital Signs  ED Triage Vitals  Enc Vitals Group     BP --      Pulse --      Resp --      Temp --      Temp src --      SpO2 --      Weight 20-Oct-2020 2041 108 lb 0.4 oz (49  kg)     Height Oct 31, 2020 2041 5\' 4"  (1.626 m)     Head Circumference --      Peak Flow --      Pain Score 31-Oct-2020 2041 0     Pain Loc --      Pain Edu? --      Excl. in [place]? --     Physical Exam Vitals reviewed.  Constitutional:      Appearance: She is cachectic.  HENT:     Head: Normocephalic and atraumatic.     Nose: Nose normal.     Mouth/Throat:     Mouth: Mucous membranes are dry.  Eyes:     Pupils: Pupils are equal.     Right eye: Pupil is reactive.     Left eye: Pupil is reactive.     Comments: Pupils are 6 mm and dilated and nonreactive bilaterally  Cardiovascular:     Pulses:           Carotid pulses are 0 on the right side and 0 on the left side.      Femoral pulses are 0 on the right side and 0 on the left side. Pulmonary:     Breath sounds: Normal breath sounds.     Comments: King airway in place with b/l breath sounds  Abdominal:     General: There is no distension.  Musculoskeletal:        General: No swelling or deformity.     Cervical back: Neck supple.  Skin:    General: Skin is dry.  Neurological:     GCS: GCS eye subscore is 1. GCS verbal subscore is 1. GCS motor subscore is 1.     ED Results / Procedures / Treatments   Labs (all labs ordered are listed, but only abnormal results are displayed) Labs Reviewed - No data to display  EKG None  Radiology No results found.  Procedures .Critical Care Performed by: Lennice Sites, DO Authorized by: Lennice Sites, DO   Critical care provider statement:    Critical care time (minutes):  35   Critical care was necessary to treat or prevent imminent or life-threatening deterioration of the following conditions:  Cardiac failure   Critical care was time spent personally by me on the following activities:  Blood draw for specimens, development of treatment plan with patient or surrogate, evaluation of patient's response to treatment, re-evaluation of patient's condition, pulse oximetry, review of old charts, interpretation of cardiac output measurements and obtaining history from patient or surrogate   I assumed direction of critical care for this patient from another provider in my specialty: no       Cardiopulmonary Resuscitation (CPR) Procedure Note Directed/Performed by: Lennice Sites I personally directed ancillary staff and/or performed CPR in an effort to regain return of spontaneous circulation and to maintain cardiac, neuro and systemic perfusion.   Medications Ordered in ED Medications  EPINEPHrine (ADRENALIN) 1 MG/10ML injection (1 mg Intravenous Given 31-Oct-2020 2038)    ED Course  I have  reviewed the triage vital signs and the nursing notes.  Pertinent labs & imaging results that were available during my care of the patient were reviewed by me and considered in my medical decision making (see chart for details).    MDM Rules/Calculators/A&P                          Rachael Johnson is a 70 year old female with history of hypertension,  seizures, cancer, COPD who presented to the ED with cardiac arrest.  Patient had no pulses upon arrival.  CPR was reinitiated for 1 round and a dose of epinephrine was given but no return of spontaneous circulation was achieved.  She was asystole on the monitor.  Bedside echocardiogram showed no cardiac activity.  She had a witnessed arrest at home while having some alcohol in bed.  No trauma.  No signs of trauma on exam.  Supposedly she had not been in the best of health.  Had cancer history.  Had Covid recently as well.  Suspect likely massive heart attack or blood clot.  EMS had no shockable rhythms.  She had a total downtime of over 1 hour prior to my care.  They thought they had return of spontaneous circulation about 30 minutes after initial resuscitation.  They were pacing the patient for the last 30 minutes prior to arrival here but she had no pulses.  Pupils were fixed and dilated.  At that time resuscitation was called.  Patient was pronounced dead at 11-04-36.  Medical examiner, Dr. Kenton Kingfisher was called and will not be an ME case.  Primary care doctor was notified of death as well, Dr. Sharlet Salina will let Dr. Maryellen Pile know of her passing.  Family to be notified.  This chart was dictated using voice recognition software.  Despite best efforts to proofread,  errors can occur which can change the documentation meaning.    Final Clinical Impression(s) / ED Diagnoses Final diagnoses:  Cardiac arrest [facility])    Rx / DC Orders ED Discharge Orders    None       Lennice Sites, DO Oct 31, 2020 2129

## 2020-10-29 DEATH — deceased

## 2021-01-20 ENCOUNTER — Telehealth: Payer: Medicare Other | Admitting: Family Medicine

## 2021-03-15 ENCOUNTER — Encounter: Payer: Medicare Other | Admitting: Family Medicine
